# Patient Record
Sex: Male | Born: 1935
Health system: Southern US, Community
[De-identification: ages and names within clinical notes are randomized; demographics above are authoritative.]

## PROBLEM LIST (undated history)

## (undated) DIAGNOSIS — K602 Anal fissure, unspecified: Secondary | ICD-10-CM

## (undated) DIAGNOSIS — L2089 Other atopic dermatitis: Secondary | ICD-10-CM

## (undated) DIAGNOSIS — K5730 Diverticulosis of large intestine without perforation or abscess without bleeding: Secondary | ICD-10-CM

## (undated) DIAGNOSIS — E538 Deficiency of other specified B group vitamins: Secondary | ICD-10-CM

## (undated) DIAGNOSIS — M19039 Primary osteoarthritis, unspecified wrist: Secondary | ICD-10-CM

## (undated) DIAGNOSIS — K589 Irritable bowel syndrome without diarrhea: Secondary | ICD-10-CM

## (undated) DIAGNOSIS — I2510 Atherosclerotic heart disease of native coronary artery without angina pectoris: Secondary | ICD-10-CM

## (undated) DIAGNOSIS — R071 Chest pain on breathing: Secondary | ICD-10-CM

## (undated) DIAGNOSIS — R700 Elevated erythrocyte sedimentation rate: Secondary | ICD-10-CM

## (undated) DIAGNOSIS — E785 Hyperlipidemia, unspecified: Secondary | ICD-10-CM

## (undated) DIAGNOSIS — IMO0002 Reserved for concepts with insufficient information to code with codable children: Secondary | ICD-10-CM

## (undated) DIAGNOSIS — M542 Cervicalgia: Secondary | ICD-10-CM

## (undated) DIAGNOSIS — J320 Chronic maxillary sinusitis: Secondary | ICD-10-CM

## (undated) DIAGNOSIS — Z86010 Personal history of colonic polyps: Secondary | ICD-10-CM

## (undated) DIAGNOSIS — F05 Delirium due to known physiological condition: Secondary | ICD-10-CM

## (undated) DIAGNOSIS — E0590 Thyrotoxicosis, unspecified without thyrotoxic crisis or storm: Secondary | ICD-10-CM

## (undated) DIAGNOSIS — R233 Spontaneous ecchymoses: Secondary | ICD-10-CM

## (undated) DIAGNOSIS — N289 Disorder of kidney and ureter, unspecified: Secondary | ICD-10-CM

## (undated) DIAGNOSIS — K222 Esophageal obstruction: Secondary | ICD-10-CM

## (undated) DIAGNOSIS — K2940 Chronic atrophic gastritis without bleeding: Secondary | ICD-10-CM

## (undated) DIAGNOSIS — Z87442 Personal history of urinary calculi: Secondary | ICD-10-CM

## (undated) DIAGNOSIS — G608 Other hereditary and idiopathic neuropathies: Secondary | ICD-10-CM

## (undated) DIAGNOSIS — Z951 Presence of aortocoronary bypass graft: Secondary | ICD-10-CM

## (undated) DIAGNOSIS — I10 Essential (primary) hypertension: Secondary | ICD-10-CM

## (undated) DIAGNOSIS — L570 Actinic keratosis: Secondary | ICD-10-CM

## (undated) DIAGNOSIS — J0100 Acute maxillary sinusitis, unspecified: Secondary | ICD-10-CM

## (undated) DIAGNOSIS — C801 Malignant (primary) neoplasm, unspecified: Secondary | ICD-10-CM

## (undated) DIAGNOSIS — J309 Allergic rhinitis, unspecified: Secondary | ICD-10-CM

## (undated) DIAGNOSIS — K648 Other hemorrhoids: Secondary | ICD-10-CM

## (undated) DIAGNOSIS — K5289 Other specified noninfective gastroenteritis and colitis: Secondary | ICD-10-CM

## (undated) DIAGNOSIS — L719 Rosacea, unspecified: Secondary | ICD-10-CM

## (undated) HISTORY — DX: Other hemorrhoids: K64.8

## (undated) HISTORY — DX: Elevated erythrocyte sedimentation rate: R70.0

## (undated) HISTORY — PX: COLONOSCOPY: SHX174

## (undated) HISTORY — PX: VARICOSE VEIN SURGERY: SHX832

## (undated) HISTORY — DX: Reserved for concepts with insufficient information to code with codable children: IMO0002

## (undated) HISTORY — DX: Other atopic dermatitis: L20.89

## (undated) HISTORY — PX: TONSILLECTOMY: SUR1361

## (undated) HISTORY — DX: Atherosclerotic heart disease of native coronary artery without angina pectoris: I25.10

## (undated) HISTORY — DX: Spontaneous ecchymoses: R23.3

## (undated) HISTORY — DX: Chronic atrophic gastritis without bleeding: K29.40

## (undated) HISTORY — DX: Irritable bowel syndrome without diarrhea: K58.9

## (undated) HISTORY — DX: Disorder of kidney and ureter, unspecified: N28.9

## (undated) HISTORY — DX: Thyrotoxicosis, unspecified without thyrotoxic crisis or storm: E05.90

## (undated) HISTORY — DX: Allergic rhinitis, unspecified: J30.9

## (undated) HISTORY — PX: EYE SURGERY: SHX253

## (undated) HISTORY — DX: Rosacea, unspecified: L71.9

## (undated) HISTORY — DX: Actinic keratosis: L57.0

## (undated) HISTORY — PX: FINGER SURGERY: SHX640

## (undated) HISTORY — PX: CARDIAC CATHETERIZATION: SHX172

## (undated) HISTORY — DX: Cervicalgia: M54.2

## (undated) HISTORY — DX: Esophageal obstruction: K22.2

## (undated) HISTORY — DX: Other specified noninfective gastroenteritis and colitis: K52.89

## (undated) HISTORY — DX: Essential (primary) hypertension: I10

## (undated) HISTORY — DX: Chronic maxillary sinusitis: J32.0

## (undated) HISTORY — DX: Primary osteoarthritis, unspecified wrist: M19.039

## (undated) HISTORY — DX: Deficiency of other specified B group vitamins: E53.8

## (undated) HISTORY — DX: Hyperlipidemia, unspecified: E78.5

## (undated) HISTORY — PX: FLEXIBLE SIGMOIDOSCOPY: SHX1649

## (undated) HISTORY — PX: HEMORRHOID BANDING: SHX5850

## (undated) HISTORY — PX: LUMBAR FUSION: SHX111

## (undated) HISTORY — PX: ESOPHAGOGASTRODUODENOSCOPY: SHX1529

## (undated) HISTORY — DX: Other hereditary and idiopathic neuropathies: G60.8

## (undated) HISTORY — DX: Chest pain on breathing: R07.1

## (undated) HISTORY — DX: Anal fissure, unspecified: K60.2

## (undated) HISTORY — DX: Acute maxillary sinusitis, unspecified: J01.00

## (undated) HISTORY — DX: Diverticulosis of large intestine without perforation or abscess without bleeding: K57.30

## (undated) HISTORY — PX: HERNIA REPAIR: SHX51

## (undated) HISTORY — PX: OTHER SURGICAL HISTORY: SHX169

## (undated) HISTORY — DX: Personal history of colonic polyps: Z86.010

## (undated) HISTORY — DX: Malignant (primary) neoplasm, unspecified: C80.1

---

## 1999-04-24 ENCOUNTER — Other Ambulatory Visit: Admission: RE | Admit: 1999-04-24 | Discharge: 1999-04-24 | Payer: Self-pay | Admitting: Oral Surgery

## 1999-05-14 ENCOUNTER — Emergency Department (HOSPITAL_COMMUNITY): Admission: EM | Admit: 1999-05-14 | Discharge: 1999-05-14 | Payer: Self-pay | Admitting: Emergency Medicine

## 1999-05-14 ENCOUNTER — Encounter: Payer: Self-pay | Admitting: Emergency Medicine

## 2000-10-14 ENCOUNTER — Encounter (INDEPENDENT_AMBULATORY_CARE_PROVIDER_SITE_OTHER): Payer: Self-pay | Admitting: Specialist

## 2000-10-14 ENCOUNTER — Other Ambulatory Visit: Admission: RE | Admit: 2000-10-14 | Discharge: 2000-10-14 | Payer: Self-pay | Admitting: Gastroenterology

## 2001-10-26 ENCOUNTER — Encounter: Admission: RE | Admit: 2001-10-26 | Discharge: 2001-11-04 | Payer: Self-pay | Admitting: Internal Medicine

## 2001-12-31 ENCOUNTER — Encounter: Payer: Self-pay | Admitting: Urology

## 2001-12-31 ENCOUNTER — Encounter: Admission: RE | Admit: 2001-12-31 | Discharge: 2001-12-31 | Payer: Self-pay | Admitting: Urology

## 2002-08-05 ENCOUNTER — Encounter: Payer: Self-pay | Admitting: Neurological Surgery

## 2002-08-05 ENCOUNTER — Encounter: Admission: RE | Admit: 2002-08-05 | Discharge: 2002-08-05 | Payer: Self-pay | Admitting: Neurological Surgery

## 2002-08-25 ENCOUNTER — Encounter: Admission: RE | Admit: 2002-08-25 | Discharge: 2002-08-25 | Payer: Self-pay | Admitting: Neurological Surgery

## 2002-08-25 ENCOUNTER — Encounter: Payer: Self-pay | Admitting: Neurological Surgery

## 2002-09-20 ENCOUNTER — Encounter: Payer: Self-pay | Admitting: Neurological Surgery

## 2002-09-20 ENCOUNTER — Encounter: Admission: RE | Admit: 2002-09-20 | Discharge: 2002-09-20 | Payer: Self-pay | Admitting: Neurological Surgery

## 2002-12-29 ENCOUNTER — Encounter: Payer: Self-pay | Admitting: Neurological Surgery

## 2002-12-29 ENCOUNTER — Encounter: Admission: RE | Admit: 2002-12-29 | Discharge: 2002-12-29 | Payer: Self-pay | Admitting: Neurological Surgery

## 2003-01-18 ENCOUNTER — Encounter: Admission: RE | Admit: 2003-01-18 | Discharge: 2003-01-18 | Payer: Self-pay | Admitting: Neurological Surgery

## 2003-01-18 ENCOUNTER — Encounter: Payer: Self-pay | Admitting: Neurological Surgery

## 2003-05-17 ENCOUNTER — Encounter: Payer: Self-pay | Admitting: Neurological Surgery

## 2003-12-23 ENCOUNTER — Emergency Department (HOSPITAL_COMMUNITY): Admission: EM | Admit: 2003-12-23 | Discharge: 2003-12-23 | Payer: Self-pay | Admitting: Emergency Medicine

## 2004-09-17 ENCOUNTER — Ambulatory Visit: Payer: Self-pay | Admitting: Internal Medicine

## 2004-09-24 ENCOUNTER — Ambulatory Visit: Payer: Self-pay | Admitting: Internal Medicine

## 2004-10-08 ENCOUNTER — Ambulatory Visit: Payer: Self-pay | Admitting: Internal Medicine

## 2004-10-16 ENCOUNTER — Ambulatory Visit: Payer: Self-pay | Admitting: Internal Medicine

## 2004-10-29 ENCOUNTER — Ambulatory Visit: Payer: Self-pay | Admitting: Internal Medicine

## 2004-11-07 ENCOUNTER — Ambulatory Visit: Payer: Self-pay | Admitting: Gastroenterology

## 2004-11-20 ENCOUNTER — Ambulatory Visit: Payer: Self-pay | Admitting: Internal Medicine

## 2004-12-03 ENCOUNTER — Ambulatory Visit: Payer: Self-pay | Admitting: Gastroenterology

## 2004-12-05 ENCOUNTER — Ambulatory Visit: Payer: Self-pay | Admitting: Gastroenterology

## 2004-12-06 ENCOUNTER — Encounter: Payer: Self-pay | Admitting: Gastroenterology

## 2004-12-09 ENCOUNTER — Ambulatory Visit: Payer: Self-pay | Admitting: Internal Medicine

## 2004-12-25 ENCOUNTER — Ambulatory Visit: Payer: Self-pay | Admitting: Internal Medicine

## 2004-12-30 ENCOUNTER — Ambulatory Visit: Payer: Self-pay | Admitting: Internal Medicine

## 2005-01-22 ENCOUNTER — Ambulatory Visit: Payer: Self-pay | Admitting: Internal Medicine

## 2005-02-11 ENCOUNTER — Ambulatory Visit: Payer: Self-pay | Admitting: Internal Medicine

## 2005-03-05 ENCOUNTER — Ambulatory Visit: Payer: Self-pay | Admitting: Internal Medicine

## 2005-03-25 ENCOUNTER — Ambulatory Visit: Payer: Self-pay | Admitting: Gastroenterology

## 2005-03-26 ENCOUNTER — Ambulatory Visit: Payer: Self-pay | Admitting: Internal Medicine

## 2005-04-16 ENCOUNTER — Ambulatory Visit: Payer: Self-pay | Admitting: Internal Medicine

## 2005-05-05 ENCOUNTER — Ambulatory Visit: Payer: Self-pay | Admitting: Internal Medicine

## 2005-05-26 ENCOUNTER — Ambulatory Visit: Payer: Self-pay | Admitting: Internal Medicine

## 2005-06-23 ENCOUNTER — Ambulatory Visit: Payer: Self-pay | Admitting: Internal Medicine

## 2005-07-04 ENCOUNTER — Ambulatory Visit: Payer: Self-pay | Admitting: Internal Medicine

## 2005-07-14 ENCOUNTER — Ambulatory Visit: Payer: Self-pay | Admitting: Internal Medicine

## 2005-07-25 ENCOUNTER — Ambulatory Visit: Payer: Self-pay | Admitting: Internal Medicine

## 2005-08-05 ENCOUNTER — Ambulatory Visit: Payer: Self-pay | Admitting: Internal Medicine

## 2005-08-25 ENCOUNTER — Ambulatory Visit: Payer: Self-pay | Admitting: Internal Medicine

## 2005-08-29 ENCOUNTER — Ambulatory Visit: Payer: Self-pay | Admitting: Internal Medicine

## 2005-09-09 ENCOUNTER — Ambulatory Visit: Payer: Self-pay | Admitting: Internal Medicine

## 2005-09-17 ENCOUNTER — Ambulatory Visit: Payer: Self-pay | Admitting: Internal Medicine

## 2005-09-26 ENCOUNTER — Ambulatory Visit: Payer: Self-pay | Admitting: Internal Medicine

## 2005-10-07 ENCOUNTER — Ambulatory Visit: Payer: Self-pay | Admitting: Internal Medicine

## 2005-10-14 ENCOUNTER — Ambulatory Visit: Payer: Self-pay | Admitting: Internal Medicine

## 2005-10-28 ENCOUNTER — Ambulatory Visit: Payer: Self-pay | Admitting: Internal Medicine

## 2005-11-18 ENCOUNTER — Ambulatory Visit: Payer: Self-pay | Admitting: Internal Medicine

## 2005-12-09 ENCOUNTER — Ambulatory Visit: Payer: Self-pay | Admitting: Internal Medicine

## 2005-12-30 ENCOUNTER — Ambulatory Visit: Payer: Self-pay | Admitting: Internal Medicine

## 2006-01-01 ENCOUNTER — Ambulatory Visit: Payer: Self-pay | Admitting: Internal Medicine

## 2006-01-08 ENCOUNTER — Ambulatory Visit: Payer: Self-pay | Admitting: Internal Medicine

## 2006-01-19 ENCOUNTER — Ambulatory Visit: Payer: Self-pay | Admitting: Internal Medicine

## 2006-01-28 ENCOUNTER — Ambulatory Visit: Payer: Self-pay | Admitting: Family Medicine

## 2006-02-02 ENCOUNTER — Encounter (INDEPENDENT_AMBULATORY_CARE_PROVIDER_SITE_OTHER): Payer: Self-pay | Admitting: *Deleted

## 2006-02-02 ENCOUNTER — Ambulatory Visit (HOSPITAL_COMMUNITY): Admission: RE | Admit: 2006-02-02 | Discharge: 2006-02-02 | Payer: Self-pay | Admitting: Nephrology

## 2006-02-10 ENCOUNTER — Ambulatory Visit: Payer: Self-pay | Admitting: Internal Medicine

## 2006-02-24 ENCOUNTER — Ambulatory Visit: Payer: Self-pay | Admitting: Gastroenterology

## 2006-03-19 ENCOUNTER — Ambulatory Visit: Payer: Self-pay | Admitting: Internal Medicine

## 2006-03-26 ENCOUNTER — Ambulatory Visit: Payer: Self-pay | Admitting: Internal Medicine

## 2006-03-27 ENCOUNTER — Ambulatory Visit: Payer: Self-pay | Admitting: Internal Medicine

## 2006-04-07 ENCOUNTER — Ambulatory Visit: Payer: Self-pay | Admitting: Internal Medicine

## 2006-04-14 ENCOUNTER — Ambulatory Visit: Payer: Self-pay | Admitting: Internal Medicine

## 2006-04-28 ENCOUNTER — Ambulatory Visit: Payer: Self-pay | Admitting: Internal Medicine

## 2006-05-18 ENCOUNTER — Ambulatory Visit: Payer: Self-pay | Admitting: Internal Medicine

## 2006-06-15 ENCOUNTER — Ambulatory Visit: Payer: Self-pay | Admitting: Internal Medicine

## 2006-06-25 ENCOUNTER — Ambulatory Visit (HOSPITAL_COMMUNITY): Admission: RE | Admit: 2006-06-25 | Discharge: 2006-06-25 | Payer: Self-pay | Admitting: Urology

## 2006-07-02 ENCOUNTER — Ambulatory Visit: Payer: Self-pay | Admitting: Internal Medicine

## 2006-07-28 ENCOUNTER — Ambulatory Visit: Payer: Self-pay | Admitting: Internal Medicine

## 2006-08-10 ENCOUNTER — Ambulatory Visit: Payer: Self-pay | Admitting: Internal Medicine

## 2006-08-18 ENCOUNTER — Ambulatory Visit: Payer: Self-pay | Admitting: Internal Medicine

## 2006-08-28 ENCOUNTER — Ambulatory Visit (HOSPITAL_BASED_OUTPATIENT_CLINIC_OR_DEPARTMENT_OTHER): Admission: RE | Admit: 2006-08-28 | Discharge: 2006-08-28 | Payer: Self-pay | Admitting: Urology

## 2006-09-10 ENCOUNTER — Ambulatory Visit: Payer: Self-pay | Admitting: Internal Medicine

## 2006-09-10 LAB — CONVERTED CEMR LAB
Potassium: 5 meq/L (ref 3.5–5.1)
Rheumatoid Fact: <20 intl units/mL — ABNORMAL LOW (ref 0.0–20.0)

## 2006-09-22 ENCOUNTER — Ambulatory Visit: Payer: Self-pay | Admitting: Gastroenterology

## 2006-09-29 ENCOUNTER — Ambulatory Visit: Payer: Self-pay | Admitting: Internal Medicine

## 2006-10-19 ENCOUNTER — Ambulatory Visit: Payer: Self-pay | Admitting: Internal Medicine

## 2006-11-12 ENCOUNTER — Ambulatory Visit: Payer: Self-pay | Admitting: Internal Medicine

## 2006-11-24 ENCOUNTER — Ambulatory Visit: Payer: Self-pay | Admitting: Internal Medicine

## 2006-11-30 ENCOUNTER — Ambulatory Visit: Payer: Self-pay | Admitting: Internal Medicine

## 2006-12-22 ENCOUNTER — Ambulatory Visit: Payer: Self-pay | Admitting: Internal Medicine

## 2006-12-22 LAB — CONVERTED CEMR LAB
BUN: 14 mg/dL (ref 6–23)
CO2: 29 meq/L (ref 19–32)
Calcium: 8.4 mg/dL (ref 8.4–10.5)
Chloride: 108 meq/L (ref 96–112)
Creatinine, Ser: 0.9 mg/dL (ref 0.4–1.5)
GFR calc Af Amer: 108 mL/min
GFR calc non Af Amer: 89 mL/min
Glucose, Bld: 99 mg/dL (ref 70–99)
Potassium: 4.7 meq/L (ref 3.5–5.1)
Sodium: 144 meq/L (ref 135–145)

## 2007-01-14 ENCOUNTER — Ambulatory Visit: Payer: Self-pay | Admitting: Internal Medicine

## 2007-02-03 ENCOUNTER — Ambulatory Visit: Payer: Self-pay | Admitting: Internal Medicine

## 2007-02-22 ENCOUNTER — Ambulatory Visit: Payer: Self-pay | Admitting: Internal Medicine

## 2007-03-12 ENCOUNTER — Ambulatory Visit: Payer: Self-pay | Admitting: Internal Medicine

## 2007-03-12 LAB — CONVERTED CEMR LAB
BUN: 16 mg/dL (ref 6–23)
CO2: 24 meq/L (ref 19–32)
Calcium: 8.3 mg/dL — ABNORMAL LOW (ref 8.4–10.5)
Chloride: 110 meq/L (ref 96–112)
Creatinine, Ser: 0.78 mg/dL (ref 0.40–1.50)
Folate: >20 ng/mL
Glucose, Bld: 98 mg/dL (ref 70–99)
Potassium: 5 meq/L (ref 3.5–5.3)
Sodium: 143 meq/L (ref 135–145)
TSH: 4.723 microintl units/mL (ref 0.350–5.50)
Vitamin B-12: >2000 pg/mL — ABNORMAL HIGH (ref 211–911)

## 2007-03-31 ENCOUNTER — Ambulatory Visit: Payer: Self-pay | Admitting: Internal Medicine

## 2007-04-09 DIAGNOSIS — I10 Essential (primary) hypertension: Secondary | ICD-10-CM

## 2007-04-09 DIAGNOSIS — N289 Disorder of kidney and ureter, unspecified: Secondary | ICD-10-CM

## 2007-04-09 DIAGNOSIS — J309 Allergic rhinitis, unspecified: Secondary | ICD-10-CM | POA: Insufficient documentation

## 2007-04-09 HISTORY — DX: Essential (primary) hypertension: I10

## 2007-04-09 HISTORY — DX: Allergic rhinitis, unspecified: J30.9

## 2007-04-09 HISTORY — DX: Disorder of kidney and ureter, unspecified: N28.9

## 2007-04-22 ENCOUNTER — Ambulatory Visit: Payer: Self-pay | Admitting: Internal Medicine

## 2007-05-17 ENCOUNTER — Ambulatory Visit: Payer: Self-pay | Admitting: Internal Medicine

## 2007-05-17 LAB — CONVERTED CEMR LAB
ANA Titer 1: 1:160 {titer} — ABNORMAL HIGH
Albumin ELP: 39.9 % — ABNORMAL LOW (ref 55.8–66.1)
Alpha-1-Globulin: 5.6 % — ABNORMAL HIGH (ref 2.9–4.9)
Alpha-2-Globulin: 22.2 % — ABNORMAL HIGH (ref 7.1–11.8)
Angiotensin 1 Converting Enzyme: 72 units/L — ABNORMAL HIGH (ref 9–67)
Anti Nuclear Antibody(ANA): POSITIVE — AB
Beta Globulin: 6 % (ref 4.7–7.2)
Gamma Globulin: 17.7 % (ref 11.1–18.8)
Hgb A1c MFr Bld: 6.1 % — ABNORMAL HIGH (ref 4.6–6.0)
Total Protein, Serum Electrophoresis: 5.4 g/dL — ABNORMAL LOW (ref 6.0–8.3)

## 2007-05-20 ENCOUNTER — Telehealth (INDEPENDENT_AMBULATORY_CARE_PROVIDER_SITE_OTHER): Payer: Self-pay | Admitting: *Deleted

## 2007-05-20 ENCOUNTER — Encounter: Payer: Self-pay | Admitting: Internal Medicine

## 2007-06-07 ENCOUNTER — Ambulatory Visit: Payer: Self-pay | Admitting: Internal Medicine

## 2007-06-07 DIAGNOSIS — E538 Deficiency of other specified B group vitamins: Secondary | ICD-10-CM | POA: Insufficient documentation

## 2007-06-07 HISTORY — DX: Deficiency of other specified B group vitamins: E53.8

## 2007-06-28 ENCOUNTER — Ambulatory Visit: Payer: Self-pay | Admitting: Internal Medicine

## 2007-06-30 ENCOUNTER — Ambulatory Visit: Payer: Self-pay | Admitting: Internal Medicine

## 2007-07-01 ENCOUNTER — Encounter: Payer: Self-pay | Admitting: Internal Medicine

## 2007-07-21 ENCOUNTER — Ambulatory Visit: Payer: Self-pay | Admitting: Internal Medicine

## 2007-07-21 LAB — CONVERTED CEMR LAB
Albumin ELP: 43.1 % — ABNORMAL LOW (ref 55.8–66.1)
Alpha-1-Globulin: 13.6 % — ABNORMAL HIGH (ref 2.9–4.9)
Alpha-2-Globulin: 13.7 % — ABNORMAL HIGH (ref 7.1–11.8)
Beta Globulin: 6.5 % (ref 4.7–7.2)
Gamma Globulin: 17 % (ref 11.1–18.8)
IgA: 372 mg/dL (ref 68–378)
IgG (Immunoglobin G), Serum: 918 mg/dL (ref 694–1618)
IgM, Serum: 38 mg/dL — ABNORMAL LOW (ref 60–263)
Total Protein, Serum Electrophoresis: 4.8 g/dL — ABNORMAL LOW (ref 6.0–8.3)

## 2007-08-10 ENCOUNTER — Encounter: Payer: Self-pay | Admitting: Internal Medicine

## 2007-08-11 ENCOUNTER — Ambulatory Visit: Payer: Self-pay | Admitting: Internal Medicine

## 2007-08-11 DIAGNOSIS — G608 Other hereditary and idiopathic neuropathies: Secondary | ICD-10-CM

## 2007-08-11 DIAGNOSIS — K589 Irritable bowel syndrome without diarrhea: Secondary | ICD-10-CM | POA: Insufficient documentation

## 2007-08-11 HISTORY — DX: Other hereditary and idiopathic neuropathies: G60.8

## 2007-08-11 HISTORY — DX: Irritable bowel syndrome, unspecified: K58.9

## 2007-08-24 ENCOUNTER — Telehealth: Payer: Self-pay | Admitting: Internal Medicine

## 2007-08-31 ENCOUNTER — Ambulatory Visit: Payer: Self-pay | Admitting: Internal Medicine

## 2007-09-22 ENCOUNTER — Ambulatory Visit: Payer: Self-pay | Admitting: Internal Medicine

## 2007-10-12 ENCOUNTER — Ambulatory Visit: Payer: Self-pay | Admitting: Internal Medicine

## 2007-10-12 DIAGNOSIS — L2089 Other atopic dermatitis: Secondary | ICD-10-CM | POA: Insufficient documentation

## 2007-10-12 HISTORY — DX: Other atopic dermatitis: L20.89

## 2007-10-26 ENCOUNTER — Ambulatory Visit: Payer: Self-pay | Admitting: Gastroenterology

## 2007-10-26 LAB — CONVERTED CEMR LAB
ALT: 15 units/L (ref 0–53)
AST: 25 units/L (ref 0–37)
Albumin: 1.6 g/dL — ABNORMAL LOW (ref 3.5–5.2)
Alkaline Phosphatase: 71 units/L (ref 39–117)
BUN: 17 mg/dL (ref 6–23)
Basophils Absolute: 0 10*3/uL (ref 0.0–0.1)
Basophils Relative: 0.6 % (ref 0.0–1.0)
Bilirubin, Direct: 0.1 mg/dL (ref 0.0–0.3)
CO2: 30 meq/L (ref 19–32)
Calcium: 9 mg/dL (ref 8.4–10.5)
Chloride: 101 meq/L (ref 96–112)
Creatinine, Ser: 0.8 mg/dL (ref 0.4–1.5)
Eosinophils Absolute: 0.3 10*3/uL (ref 0.0–0.6)
Eosinophils Relative: 5 % (ref 0.0–5.0)
Ferritin: 76.8 ng/mL (ref 22.0–322.0)
Folate: >20 ng/mL
GFR calc Af Amer: 123 mL/min
GFR calc non Af Amer: 102 mL/min
Glucose, Bld: 101 mg/dL — ABNORMAL HIGH (ref 70–99)
HCT: 39 % (ref 39.0–52.0)
Hemoglobin: 13.6 g/dL (ref 13.0–17.0)
Iron: 70 ug/dL (ref 42–165)
Lymphocytes Relative: 40.6 % (ref 12.0–46.0)
MCHC: 35 g/dL (ref 30.0–36.0)
MCV: 87.6 fL (ref 78.0–100.0)
Monocytes Absolute: 0.5 10*3/uL (ref 0.2–0.7)
Monocytes Relative: 9 % (ref 3.0–11.0)
Neutro Abs: 2.3 10*3/uL (ref 1.4–7.7)
Neutrophils Relative %: 44.8 % (ref 43.0–77.0)
Platelets: 219 10*3/uL (ref 150–400)
Potassium: 4.5 meq/L (ref 3.5–5.1)
RBC: 4.45 M/uL (ref 4.22–5.81)
RDW: 13.1 % (ref 11.5–14.6)
Saturation Ratios: 30.6 % (ref 20.0–50.0)
Sodium: 138 meq/L (ref 135–145)
TSH: 4.32 microintl units/mL (ref 0.35–5.50)
Total Bilirubin: 0.6 mg/dL (ref 0.3–1.2)
Total Protein: 5.9 g/dL — ABNORMAL LOW (ref 6.0–8.3)
Transferrin: 163.2 mg/dL — ABNORMAL LOW (ref >=212.0)
Vitamin B-12: 366 pg/mL (ref 211–911)
WBC: 5.2 10*3/uL (ref 4.5–10.5)

## 2007-10-29 ENCOUNTER — Encounter: Payer: Self-pay | Admitting: Internal Medicine

## 2007-10-29 ENCOUNTER — Ambulatory Visit: Payer: Self-pay | Admitting: Gastroenterology

## 2007-11-01 ENCOUNTER — Ambulatory Visit: Payer: Self-pay | Admitting: Internal Medicine

## 2007-11-03 ENCOUNTER — Encounter: Payer: Self-pay | Admitting: Internal Medicine

## 2007-11-03 ENCOUNTER — Ambulatory Visit: Payer: Self-pay | Admitting: Gastroenterology

## 2007-11-03 LAB — CONVERTED CEMR LAB
Albumin ELP: 39.9 % — ABNORMAL LOW (ref 55.8–66.1)
Alpha-1-Globulin: 5.9 % — ABNORMAL HIGH (ref 2.9–4.9)
Alpha-2-Globulin: 22.4 % — ABNORMAL HIGH (ref 7.1–11.8)
Bacteria, UA: NEGATIVE
Beta Globulin: 6.1 % (ref 4.7–7.2)
Bilirubin Urine: NEGATIVE
Crystals: NEGATIVE
Gamma Globulin: 17.3 % (ref 11.1–18.8)
Ketones, ur: NEGATIVE mg/dL
Leukocytes, UA: NEGATIVE
Mucus, UA: NEGATIVE
Nitrite: NEGATIVE
Specific Gravity, Urine: >=1.03 (ref 1.000–1.03)
Total Protein, Serum Electrophoresis: 5 g/dL — ABNORMAL LOW (ref 6.0–8.3)
Total Protein, Urine: >=300 mg/dL — AB
Urine Glucose: NEGATIVE mg/dL
Urobilinogen, UA: 0.2 (ref 0.0–1.0)
pH: 6 (ref 5.0–8.0)

## 2007-11-04 ENCOUNTER — Encounter: Payer: Self-pay | Admitting: Gastroenterology

## 2007-11-06 ENCOUNTER — Encounter: Payer: Self-pay | Admitting: Gastroenterology

## 2007-11-06 LAB — CONVERTED CEMR LAB: Protein, Ur: 15296 mg/24hr — ABNORMAL HIGH (ref 50–100)

## 2007-11-23 ENCOUNTER — Ambulatory Visit: Payer: Self-pay | Admitting: Internal Medicine

## 2007-12-08 ENCOUNTER — Telehealth: Payer: Self-pay | Admitting: Internal Medicine

## 2007-12-14 ENCOUNTER — Ambulatory Visit: Payer: Self-pay | Admitting: Internal Medicine

## 2008-01-05 ENCOUNTER — Ambulatory Visit: Payer: Self-pay | Admitting: Internal Medicine

## 2008-01-05 LAB — CONVERTED CEMR LAB
Albumin ELP: 45.4 % — ABNORMAL LOW (ref 55.8–66.1)
Alpha-1-Globulin: 5.8 % — ABNORMAL HIGH (ref 2.9–4.9)
Alpha-2-Globulin: 21.6 % — ABNORMAL HIGH (ref 7.1–11.8)
Beta Globulin: 5.7 % (ref 4.7–7.2)
Gamma Globulin: 12.9 % (ref 11.1–18.8)
IgA: 399 mg/dL — ABNORMAL HIGH (ref 68–378)
IgG (Immunoglobin G), Serum: 621 mg/dL — ABNORMAL LOW (ref 694–1618)
IgM, Serum: 42 mg/dL — ABNORMAL LOW (ref 60–263)
Total Protein, Serum Electrophoresis: 4.9 g/dL — ABNORMAL LOW (ref 6.0–8.3)

## 2008-01-12 ENCOUNTER — Ambulatory Visit: Payer: Self-pay | Admitting: Internal Medicine

## 2008-01-13 ENCOUNTER — Telehealth: Payer: Self-pay | Admitting: Family Medicine

## 2008-01-25 ENCOUNTER — Ambulatory Visit: Payer: Self-pay | Admitting: Internal Medicine

## 2008-02-10 ENCOUNTER — Encounter: Payer: Self-pay | Admitting: Internal Medicine

## 2008-02-14 ENCOUNTER — Telehealth (INDEPENDENT_AMBULATORY_CARE_PROVIDER_SITE_OTHER): Payer: Self-pay | Admitting: *Deleted

## 2008-02-15 ENCOUNTER — Ambulatory Visit: Payer: Self-pay | Admitting: Internal Medicine

## 2008-02-15 LAB — CONVERTED CEMR LAB
Cholesterol: 419 mg/dL (ref 0–200)
Direct LDL: 267.6 mg/dL
HDL: 74 mg/dL (ref >39.0)
Total CHOL/HDL Ratio: 5.7
Triglycerides: 278 mg/dL (ref 0–149)
VLDL: 56 mg/dL — ABNORMAL HIGH (ref 0–40)

## 2008-03-07 ENCOUNTER — Ambulatory Visit: Payer: Self-pay | Admitting: Internal Medicine

## 2008-03-13 ENCOUNTER — Ambulatory Visit: Payer: Self-pay | Admitting: Internal Medicine

## 2008-03-13 DIAGNOSIS — E785 Hyperlipidemia, unspecified: Secondary | ICD-10-CM

## 2008-03-13 HISTORY — DX: Hyperlipidemia, unspecified: E78.5

## 2008-03-13 LAB — CONVERTED CEMR LAB
BUN: 22 mg/dL (ref 6–23)
CO2: 35 meq/L — ABNORMAL HIGH (ref 19–32)
Calcium: 8.5 mg/dL (ref 8.4–10.5)
Chloride: 107 meq/L (ref 96–112)
Creatinine, Ser: 0.8 mg/dL (ref 0.4–1.5)
Folate: 19.3 ng/mL
GFR calc Af Amer: 123 mL/min
GFR calc non Af Amer: 102 mL/min
Glucose, Bld: 97 mg/dL (ref 70–99)
Magnesium: 2.1 mg/dL (ref 1.5–2.5)
Potassium: 4.5 meq/L (ref 3.5–5.1)
Sodium: 144 meq/L (ref 135–145)
Vitamin B-12: 640 pg/mL (ref 211–911)

## 2008-03-16 ENCOUNTER — Telehealth: Payer: Self-pay | Admitting: Internal Medicine

## 2008-03-21 ENCOUNTER — Telehealth: Payer: Self-pay | Admitting: Internal Medicine

## 2008-03-27 ENCOUNTER — Telehealth: Payer: Self-pay | Admitting: Internal Medicine

## 2008-03-28 ENCOUNTER — Ambulatory Visit: Payer: Self-pay | Admitting: Internal Medicine

## 2008-03-28 LAB — CONVERTED CEMR LAB
Basophils Absolute: 0 10*3/uL (ref 0.0–0.1)
Basophils Relative: 0.3 % (ref 0.0–1.0)
Eosinophils Absolute: 0.1 10*3/uL (ref 0.0–0.7)
Eosinophils Relative: 1.5 % (ref 0.0–5.0)
HCT: 41.7 % (ref 39.0–52.0)
Hemoglobin: 14.1 g/dL (ref 13.0–17.0)
Lymphocytes Relative: 26.1 % (ref 12.0–46.0)
MCHC: 33.8 g/dL (ref 30.0–36.0)
MCV: 90.5 fL (ref 78.0–100.0)
Monocytes Absolute: 0.5 10*3/uL (ref 0.1–1.0)
Monocytes Relative: 5.7 % (ref 3.0–12.0)
Neutro Abs: 5.3 10*3/uL (ref 1.4–7.7)
Neutrophils Relative %: 66.4 % (ref 43.0–77.0)
Platelets: 269 10*3/uL (ref 150–400)
RBC: 4.61 M/uL (ref 4.22–5.81)
RDW: 13.1 % (ref 11.5–14.6)
WBC: 8 10*3/uL (ref 4.5–10.5)

## 2008-04-04 ENCOUNTER — Encounter: Payer: Self-pay | Admitting: Internal Medicine

## 2008-04-18 ENCOUNTER — Ambulatory Visit: Payer: Self-pay | Admitting: Internal Medicine

## 2008-04-24 ENCOUNTER — Ambulatory Visit: Payer: Self-pay | Admitting: Internal Medicine

## 2008-04-24 DIAGNOSIS — IMO0002 Reserved for concepts with insufficient information to code with codable children: Secondary | ICD-10-CM | POA: Insufficient documentation

## 2008-04-24 HISTORY — DX: Reserved for concepts with insufficient information to code with codable children: IMO0002

## 2008-04-28 ENCOUNTER — Telehealth: Payer: Self-pay | Admitting: Internal Medicine

## 2008-04-29 ENCOUNTER — Telehealth: Payer: Self-pay | Admitting: Internal Medicine

## 2008-05-05 ENCOUNTER — Telehealth: Payer: Self-pay | Admitting: Internal Medicine

## 2008-05-05 ENCOUNTER — Ambulatory Visit: Payer: Self-pay | Admitting: Family Medicine

## 2008-05-23 ENCOUNTER — Ambulatory Visit: Payer: Self-pay | Admitting: Internal Medicine

## 2008-05-29 ENCOUNTER — Ambulatory Visit: Payer: Self-pay | Admitting: Internal Medicine

## 2008-05-29 DIAGNOSIS — J320 Chronic maxillary sinusitis: Secondary | ICD-10-CM

## 2008-05-29 HISTORY — DX: Chronic maxillary sinusitis: J32.0

## 2008-06-05 ENCOUNTER — Telehealth: Payer: Self-pay | Admitting: Internal Medicine

## 2008-06-06 ENCOUNTER — Encounter: Payer: Self-pay | Admitting: Internal Medicine

## 2008-06-13 ENCOUNTER — Ambulatory Visit: Payer: Self-pay | Admitting: Internal Medicine

## 2008-06-27 ENCOUNTER — Ambulatory Visit: Payer: Self-pay | Admitting: Internal Medicine

## 2008-06-27 DIAGNOSIS — R233 Spontaneous ecchymoses: Secondary | ICD-10-CM

## 2008-06-27 HISTORY — DX: Spontaneous ecchymoses: R23.3

## 2008-07-04 ENCOUNTER — Ambulatory Visit: Payer: Self-pay | Admitting: Internal Medicine

## 2008-07-25 ENCOUNTER — Ambulatory Visit: Payer: Self-pay | Admitting: Internal Medicine

## 2008-08-15 ENCOUNTER — Ambulatory Visit: Payer: Self-pay | Admitting: Internal Medicine

## 2008-08-17 ENCOUNTER — Encounter: Payer: Self-pay | Admitting: Internal Medicine

## 2008-08-24 ENCOUNTER — Telehealth: Payer: Self-pay | Admitting: Internal Medicine

## 2008-08-29 ENCOUNTER — Ambulatory Visit: Payer: Self-pay | Admitting: Internal Medicine

## 2008-08-31 ENCOUNTER — Telehealth: Payer: Self-pay | Admitting: Internal Medicine

## 2008-08-31 ENCOUNTER — Encounter: Payer: Self-pay | Admitting: Internal Medicine

## 2008-09-05 ENCOUNTER — Ambulatory Visit: Payer: Self-pay | Admitting: Internal Medicine

## 2008-09-07 ENCOUNTER — Telehealth: Payer: Self-pay | Admitting: Family Medicine

## 2008-09-11 ENCOUNTER — Telehealth: Payer: Self-pay | Admitting: Internal Medicine

## 2008-09-14 ENCOUNTER — Ambulatory Visit: Payer: Self-pay | Admitting: Internal Medicine

## 2008-09-14 ENCOUNTER — Telehealth: Payer: Self-pay | Admitting: Internal Medicine

## 2008-09-14 DIAGNOSIS — M542 Cervicalgia: Secondary | ICD-10-CM

## 2008-09-14 HISTORY — DX: Cervicalgia: M54.2

## 2008-09-16 ENCOUNTER — Telehealth: Payer: Self-pay | Admitting: Family Medicine

## 2008-09-18 ENCOUNTER — Telehealth: Payer: Self-pay | Admitting: Internal Medicine

## 2008-09-18 ENCOUNTER — Emergency Department (HOSPITAL_COMMUNITY): Admission: EM | Admit: 2008-09-18 | Discharge: 2008-09-18 | Payer: Self-pay | Admitting: Emergency Medicine

## 2008-09-20 ENCOUNTER — Ambulatory Visit: Payer: Self-pay | Admitting: Internal Medicine

## 2008-09-21 ENCOUNTER — Telehealth: Payer: Self-pay | Admitting: Internal Medicine

## 2008-09-22 ENCOUNTER — Telehealth: Payer: Self-pay | Admitting: Internal Medicine

## 2008-09-25 ENCOUNTER — Ambulatory Visit: Payer: Self-pay | Admitting: Internal Medicine

## 2008-09-25 ENCOUNTER — Telehealth: Payer: Self-pay | Admitting: Internal Medicine

## 2008-10-03 ENCOUNTER — Telehealth: Payer: Self-pay | Admitting: Internal Medicine

## 2008-10-10 ENCOUNTER — Telehealth: Payer: Self-pay | Admitting: Internal Medicine

## 2008-10-16 ENCOUNTER — Ambulatory Visit: Payer: Self-pay | Admitting: Internal Medicine

## 2008-10-16 LAB — CONVERTED CEMR LAB
BUN: 19 mg/dL (ref 6–23)
CO2: 30 meq/L (ref 19–32)
Calcium: 8.4 mg/dL (ref 8.4–10.5)
Chloride: 107 meq/L (ref 96–112)
Creatinine, Ser: 0.8 mg/dL (ref 0.4–1.5)
GFR calc Af Amer: 123 mL/min
GFR calc non Af Amer: 102 mL/min
Glucose, Bld: 114 mg/dL — ABNORMAL HIGH (ref 70–99)
Potassium: 4.5 meq/L (ref 3.5–5.1)
Sodium: 144 meq/L (ref 135–145)

## 2008-11-01 ENCOUNTER — Telehealth: Payer: Self-pay | Admitting: Internal Medicine

## 2008-11-06 ENCOUNTER — Ambulatory Visit: Payer: Self-pay | Admitting: Internal Medicine

## 2008-11-27 ENCOUNTER — Ambulatory Visit: Payer: Self-pay | Admitting: Internal Medicine

## 2008-12-11 ENCOUNTER — Ambulatory Visit: Payer: Self-pay | Admitting: Internal Medicine

## 2008-12-11 DIAGNOSIS — H43399 Other vitreous opacities, unspecified eye: Secondary | ICD-10-CM | POA: Insufficient documentation

## 2008-12-18 ENCOUNTER — Ambulatory Visit: Payer: Self-pay | Admitting: Internal Medicine

## 2009-01-08 ENCOUNTER — Ambulatory Visit: Payer: Self-pay | Admitting: Internal Medicine

## 2009-01-29 ENCOUNTER — Ambulatory Visit: Payer: Self-pay | Admitting: Internal Medicine

## 2009-02-19 ENCOUNTER — Ambulatory Visit: Payer: Self-pay | Admitting: Internal Medicine

## 2009-03-12 ENCOUNTER — Ambulatory Visit: Payer: Self-pay | Admitting: Internal Medicine

## 2009-03-27 ENCOUNTER — Ambulatory Visit: Payer: Self-pay | Admitting: Internal Medicine

## 2009-03-27 LAB — CONVERTED CEMR LAB
BUN: 21 mg/dL (ref 6–23)
CO2: 31 meq/L (ref 19–32)
Calcium: 9 mg/dL (ref 8.4–10.5)
Chloride: 109 meq/L (ref 96–112)
Cholesterol: 256 mg/dL — ABNORMAL HIGH (ref 0–200)
Creatinine, Ser: 0.8 mg/dL (ref 0.4–1.5)
Direct LDL: 173.6 mg/dL
Folate: >20 ng/mL
GFR calc non Af Amer: 101.36 mL/min (ref >60)
Glucose, Bld: 97 mg/dL (ref 70–99)
HDL: 33.9 mg/dL — ABNORMAL LOW (ref >39.00)
Potassium: 4.6 meq/L (ref 3.5–5.1)
Sodium: 143 meq/L (ref 135–145)
Vitamin B-12: 374 pg/mL (ref 211–911)

## 2009-03-30 ENCOUNTER — Telehealth: Payer: Self-pay | Admitting: Internal Medicine

## 2009-04-02 ENCOUNTER — Ambulatory Visit: Payer: Self-pay | Admitting: Internal Medicine

## 2009-04-03 ENCOUNTER — Encounter: Admission: RE | Admit: 2009-04-03 | Discharge: 2009-04-03 | Payer: Self-pay | Admitting: Sports Medicine

## 2009-04-05 ENCOUNTER — Telehealth: Payer: Self-pay | Admitting: Internal Medicine

## 2009-04-18 ENCOUNTER — Ambulatory Visit: Payer: Self-pay | Admitting: Internal Medicine

## 2009-04-18 DIAGNOSIS — L570 Actinic keratosis: Secondary | ICD-10-CM | POA: Insufficient documentation

## 2009-04-18 HISTORY — DX: Actinic keratosis: L57.0

## 2009-04-27 DIAGNOSIS — K222 Esophageal obstruction: Secondary | ICD-10-CM

## 2009-04-27 DIAGNOSIS — K5289 Other specified noninfective gastroenteritis and colitis: Secondary | ICD-10-CM

## 2009-04-27 DIAGNOSIS — K5730 Diverticulosis of large intestine without perforation or abscess without bleeding: Secondary | ICD-10-CM

## 2009-04-27 DIAGNOSIS — K2940 Chronic atrophic gastritis without bleeding: Secondary | ICD-10-CM | POA: Insufficient documentation

## 2009-04-27 DIAGNOSIS — Z860100 Personal history of colon polyps, unspecified: Secondary | ICD-10-CM | POA: Insufficient documentation

## 2009-04-27 HISTORY — DX: Other specified noninfective gastroenteritis and colitis: K52.89

## 2009-04-27 HISTORY — DX: Personal history of colon polyps, unspecified: Z86.0100

## 2009-04-27 HISTORY — DX: Esophageal obstruction: K22.2

## 2009-04-27 HISTORY — DX: Personal history of colonic polyps: Z86.010

## 2009-04-27 HISTORY — DX: Chronic atrophic gastritis without bleeding: K29.40

## 2009-04-27 HISTORY — DX: Diverticulosis of large intestine without perforation or abscess without bleeding: K57.30

## 2009-05-01 ENCOUNTER — Ambulatory Visit: Payer: Self-pay | Admitting: Gastroenterology

## 2009-05-01 DIAGNOSIS — R1031 Right lower quadrant pain: Secondary | ICD-10-CM | POA: Insufficient documentation

## 2009-05-02 DIAGNOSIS — R700 Elevated erythrocyte sedimentation rate: Secondary | ICD-10-CM

## 2009-05-02 HISTORY — DX: Elevated erythrocyte sedimentation rate: R70.0

## 2009-05-02 LAB — CONVERTED CEMR LAB: Sed Rate: 74 mm/hr — ABNORMAL HIGH (ref 0–22)

## 2009-05-03 ENCOUNTER — Ambulatory Visit: Payer: Self-pay | Admitting: Gastroenterology

## 2009-05-03 LAB — CONVERTED CEMR LAB
BUN: 21 mg/dL (ref 6–23)
Creatinine, Ser: 0.9 mg/dL (ref 0.4–1.5)

## 2009-05-04 ENCOUNTER — Encounter: Payer: Self-pay | Admitting: Internal Medicine

## 2009-05-04 ENCOUNTER — Telehealth: Payer: Self-pay | Admitting: Gastroenterology

## 2009-05-07 ENCOUNTER — Telehealth: Payer: Self-pay | Admitting: Gastroenterology

## 2009-05-07 ENCOUNTER — Ambulatory Visit: Payer: Self-pay | Admitting: Internal Medicine

## 2009-05-09 ENCOUNTER — Ambulatory Visit: Payer: Self-pay | Admitting: Internal Medicine

## 2009-05-15 ENCOUNTER — Ambulatory Visit: Payer: Self-pay | Admitting: Internal Medicine

## 2009-05-15 DIAGNOSIS — R109 Unspecified abdominal pain: Secondary | ICD-10-CM | POA: Insufficient documentation

## 2009-05-30 ENCOUNTER — Ambulatory Visit: Payer: Self-pay | Admitting: Internal Medicine

## 2009-06-15 ENCOUNTER — Ambulatory Visit: Payer: Self-pay | Admitting: Family Medicine

## 2009-06-15 DIAGNOSIS — R071 Chest pain on breathing: Secondary | ICD-10-CM | POA: Insufficient documentation

## 2009-06-15 HISTORY — DX: Chest pain on breathing: R07.1

## 2009-06-27 ENCOUNTER — Ambulatory Visit: Payer: Self-pay | Admitting: Internal Medicine

## 2009-06-27 DIAGNOSIS — L719 Rosacea, unspecified: Secondary | ICD-10-CM | POA: Insufficient documentation

## 2009-06-27 HISTORY — DX: Rosacea, unspecified: L71.9

## 2009-07-05 ENCOUNTER — Telehealth: Payer: Self-pay | Admitting: Internal Medicine

## 2009-07-08 ENCOUNTER — Telehealth: Payer: Self-pay | Admitting: Family Medicine

## 2009-07-09 ENCOUNTER — Telehealth (INDEPENDENT_AMBULATORY_CARE_PROVIDER_SITE_OTHER): Payer: Self-pay | Admitting: *Deleted

## 2009-07-10 ENCOUNTER — Telehealth: Payer: Self-pay | Admitting: Internal Medicine

## 2009-07-18 ENCOUNTER — Ambulatory Visit: Payer: Self-pay | Admitting: Internal Medicine

## 2009-08-06 ENCOUNTER — Telehealth: Payer: Self-pay | Admitting: Internal Medicine

## 2009-08-08 ENCOUNTER — Ambulatory Visit: Payer: Self-pay | Admitting: Internal Medicine

## 2009-08-16 ENCOUNTER — Telehealth: Payer: Self-pay | Admitting: Internal Medicine

## 2009-08-20 ENCOUNTER — Ambulatory Visit: Payer: Self-pay | Admitting: Internal Medicine

## 2009-08-27 LAB — HM DIABETES EYE EXAM

## 2009-08-29 ENCOUNTER — Ambulatory Visit: Payer: Self-pay | Admitting: Internal Medicine

## 2009-09-24 ENCOUNTER — Ambulatory Visit: Payer: Self-pay | Admitting: Internal Medicine

## 2009-10-15 ENCOUNTER — Encounter: Admission: RE | Admit: 2009-10-15 | Discharge: 2009-10-15 | Payer: Self-pay | Admitting: Sports Medicine

## 2009-10-17 ENCOUNTER — Ambulatory Visit: Payer: Self-pay | Admitting: Internal Medicine

## 2009-10-19 ENCOUNTER — Telehealth: Payer: Self-pay | Admitting: Internal Medicine

## 2009-10-24 ENCOUNTER — Encounter (INDEPENDENT_AMBULATORY_CARE_PROVIDER_SITE_OTHER): Payer: Self-pay | Admitting: *Deleted

## 2009-10-30 ENCOUNTER — Ambulatory Visit: Payer: Self-pay | Admitting: Gastroenterology

## 2009-10-30 LAB — CONVERTED CEMR LAB
ALT: 14 units/L (ref 0–53)
AST: 23 units/L (ref 0–37)
Albumin: 2.7 g/dL — ABNORMAL LOW (ref 3.5–5.2)
Alkaline Phosphatase: 56 units/L (ref 39–117)
BUN: 16 mg/dL (ref 6–23)
Basophils Absolute: 0 10*3/uL (ref 0.0–0.1)
Basophils Relative: 0.4 % (ref 0.0–3.0)
Bilirubin, Direct: 0.1 mg/dL (ref 0.0–0.3)
CO2: 30 meq/L (ref 19–32)
CRP, High Sensitivity: 2.6 (ref 0.00–5.00)
Calcium: 8.9 mg/dL (ref 8.4–10.5)
Chloride: 105 meq/L (ref 96–112)
Creatinine, Ser: 0.7 mg/dL (ref 0.4–1.5)
Eosinophils Absolute: 0.2 10*3/uL (ref 0.0–0.7)
Eosinophils Relative: 4.5 % (ref 0.0–5.0)
Ferritin: 60 ng/mL (ref 22.0–322.0)
Folate: >20 ng/mL
GFR calc non Af Amer: 118.04 mL/min (ref >60)
Glucose, Bld: 93 mg/dL (ref 70–99)
HCT: 42.6 % (ref 39.0–52.0)
Hemoglobin: 14.2 g/dL (ref 13.0–17.0)
IgA: 369 mg/dL (ref 68–378)
Iron: 95 ug/dL (ref 42–165)
Lymphocytes Relative: 33.6 % (ref 12.0–46.0)
Lymphs Abs: 1.8 10*3/uL (ref 0.7–4.0)
MCHC: 33.3 g/dL (ref 30.0–36.0)
MCV: 90.5 fL (ref 78.0–100.0)
Monocytes Absolute: 0.4 10*3/uL (ref 0.1–1.0)
Monocytes Relative: 8.2 % (ref 3.0–12.0)
Neutro Abs: 3 10*3/uL (ref 1.4–7.7)
Neutrophils Relative %: 53.3 % (ref 43.0–77.0)
Platelets: 153 10*3/uL (ref 150.0–400.0)
Potassium: 4.5 meq/L (ref 3.5–5.1)
RBC: 4.71 M/uL (ref 4.22–5.81)
RDW: 12.9 % (ref 11.5–14.6)
Saturation Ratios: 35.2 % (ref 20.0–50.0)
Sed Rate: 68 mm/hr — ABNORMAL HIGH (ref 0–22)
Sodium: 142 meq/L (ref 135–145)
TSH: 3.97 microintl units/mL (ref 0.35–5.50)
Total Bilirubin: 0.9 mg/dL (ref 0.3–1.2)
Total Protein: 6.7 g/dL (ref 6.0–8.3)
Transferrin: 192.6 mg/dL — ABNORMAL LOW (ref 212.0–360.0)
Vitamin B-12: 587 pg/mL (ref 211–911)
WBC: 5.4 10*3/uL (ref 4.5–10.5)

## 2009-10-31 ENCOUNTER — Encounter: Admission: RE | Admit: 2009-10-31 | Discharge: 2009-10-31 | Payer: Self-pay | Admitting: Sports Medicine

## 2009-11-05 LAB — CONVERTED CEMR LAB: Tissue Transglutaminase Ab, IgA: 0.8 units (ref <7)

## 2009-11-06 ENCOUNTER — Telehealth: Payer: Self-pay | Admitting: Gastroenterology

## 2009-11-06 ENCOUNTER — Ambulatory Visit: Payer: Self-pay | Admitting: Internal Medicine

## 2009-11-13 ENCOUNTER — Telehealth: Payer: Self-pay | Admitting: Gastroenterology

## 2009-11-21 ENCOUNTER — Ambulatory Visit: Payer: Self-pay | Admitting: Gastroenterology

## 2009-11-21 LAB — HM COLONOSCOPY

## 2009-11-23 ENCOUNTER — Encounter: Payer: Self-pay | Admitting: Gastroenterology

## 2009-11-30 ENCOUNTER — Ambulatory Visit: Payer: Self-pay | Admitting: Internal Medicine

## 2009-12-10 ENCOUNTER — Telehealth: Payer: Self-pay | Admitting: Gastroenterology

## 2009-12-14 ENCOUNTER — Telehealth: Payer: Self-pay | Admitting: Internal Medicine

## 2009-12-18 ENCOUNTER — Encounter: Admission: RE | Admit: 2009-12-18 | Discharge: 2009-12-18 | Payer: Self-pay | Admitting: Surgery

## 2009-12-31 ENCOUNTER — Ambulatory Visit: Payer: Self-pay | Admitting: Internal Medicine

## 2010-01-03 ENCOUNTER — Encounter: Payer: Self-pay | Admitting: Internal Medicine

## 2010-01-09 ENCOUNTER — Encounter: Admission: RE | Admit: 2010-01-09 | Discharge: 2010-01-09 | Payer: Self-pay | Admitting: Chiropractic Medicine

## 2010-01-14 ENCOUNTER — Telehealth: Payer: Self-pay | Admitting: Internal Medicine

## 2010-01-18 ENCOUNTER — Telehealth: Payer: Self-pay | Admitting: Internal Medicine

## 2010-01-21 ENCOUNTER — Ambulatory Visit: Payer: Self-pay | Admitting: Family Medicine

## 2010-02-12 ENCOUNTER — Ambulatory Visit: Payer: Self-pay | Admitting: Internal Medicine

## 2010-02-12 LAB — CONVERTED CEMR LAB
BUN: 18 mg/dL (ref 6–23)
CO2: 30 meq/L (ref 19–32)
Calcium: 8.9 mg/dL (ref 8.4–10.5)
Chloride: 107 meq/L (ref 96–112)
Cholesterol, target level: 200 mg/dL
Cholesterol: 259 mg/dL — ABNORMAL HIGH (ref 0–200)
Creatinine, Ser: 0.8 mg/dL (ref 0.4–1.5)
Direct LDL: 163.2 mg/dL
GFR calc non Af Amer: 101.1 mL/min (ref >60)
Glucose, Bld: 99 mg/dL (ref 70–99)
HDL goal, serum: 40 mg/dL
HDL: 45.2 mg/dL (ref >39.00)
LDL Goal: 130 mg/dL
Potassium: 5.1 meq/L (ref 3.5–5.1)
Sodium: 149 meq/L — ABNORMAL HIGH (ref 135–145)
TSH: 2.35 microintl units/mL (ref 0.35–5.50)

## 2010-02-19 ENCOUNTER — Telehealth: Payer: Self-pay | Admitting: Internal Medicine

## 2010-03-05 ENCOUNTER — Ambulatory Visit: Payer: Self-pay | Admitting: Internal Medicine

## 2010-03-26 ENCOUNTER — Ambulatory Visit: Payer: Self-pay | Admitting: Internal Medicine

## 2010-04-19 ENCOUNTER — Encounter: Payer: Self-pay | Admitting: Internal Medicine

## 2010-04-22 ENCOUNTER — Ambulatory Visit: Payer: Self-pay | Admitting: Internal Medicine

## 2010-04-26 ENCOUNTER — Telehealth: Payer: Self-pay | Admitting: Internal Medicine

## 2010-04-30 ENCOUNTER — Ambulatory Visit: Payer: Self-pay | Admitting: Internal Medicine

## 2010-05-14 ENCOUNTER — Ambulatory Visit: Payer: Self-pay | Admitting: Internal Medicine

## 2010-05-14 DIAGNOSIS — J0100 Acute maxillary sinusitis, unspecified: Secondary | ICD-10-CM

## 2010-05-14 HISTORY — DX: Acute maxillary sinusitis, unspecified: J01.00

## 2010-06-04 ENCOUNTER — Ambulatory Visit: Payer: Self-pay | Admitting: Internal Medicine

## 2010-06-25 ENCOUNTER — Ambulatory Visit: Payer: Self-pay | Admitting: Internal Medicine

## 2010-07-16 ENCOUNTER — Ambulatory Visit: Payer: Self-pay | Admitting: Internal Medicine

## 2010-07-29 ENCOUNTER — Ambulatory Visit: Payer: Self-pay | Admitting: Internal Medicine

## 2010-07-29 LAB — CONVERTED CEMR LAB
ALT: 14 units/L (ref 0–53)
AST: 22 units/L (ref 0–37)
Albumin: 3.2 g/dL — ABNORMAL LOW (ref 3.5–5.2)
Alkaline Phosphatase: 56 units/L (ref 39–117)
Bilirubin, Direct: 0.1 mg/dL (ref 0.0–0.3)
Cholesterol: 217 mg/dL — ABNORMAL HIGH (ref 0–200)
Direct LDL: 145.6 mg/dL
HDL: 38.1 mg/dL — ABNORMAL LOW (ref >39.00)
Total Bilirubin: 0.6 mg/dL (ref 0.3–1.2)
Total CHOL/HDL Ratio: 6
Total Protein: 6.4 g/dL (ref 6.0–8.3)
Triglycerides: 132 mg/dL (ref 0.0–149.0)
VLDL: 26.4 mg/dL (ref 0.0–40.0)

## 2010-07-29 IMAGING — CT CT ABD-PELV W/ CM
3 of 5 series · 13 of 36 positions shown, 19 images · IV contrast (agent unspecified)
Comparison: May 07, 2009

CLINICAL DATA: Right groin pain; history of right inguinal hernia
repair; colitis and diarrhea

CT ABDOMEN AND PELVIS WITH CONTRAST
TECHNIQUE: Multidetector CT imaging of the abdomen and pelvis was
performed following the standard protocol during bolus
administration of intravenous contrast.
Contrast: 125 ml Omni 300

[Series 3: routine abdomen · axial · 0.70mm/px · z∈[-351,-51]mm · 7 of 80 slices shown, 12 images]
[im 10/80  soft-tissue]
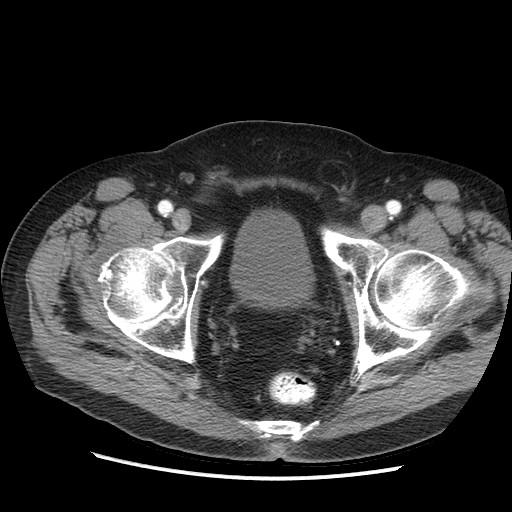
[im 10/80  bone]
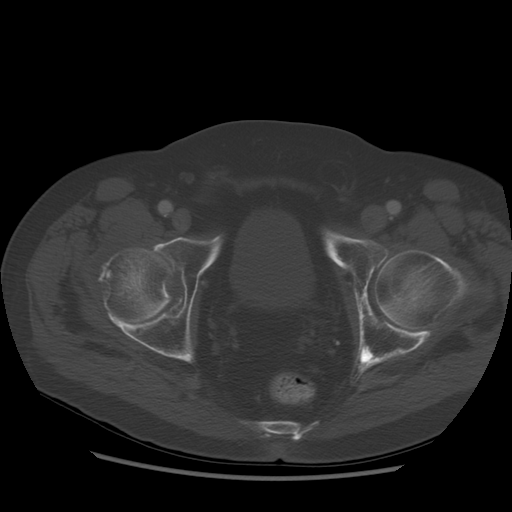
[im 20/80  soft-tissue]
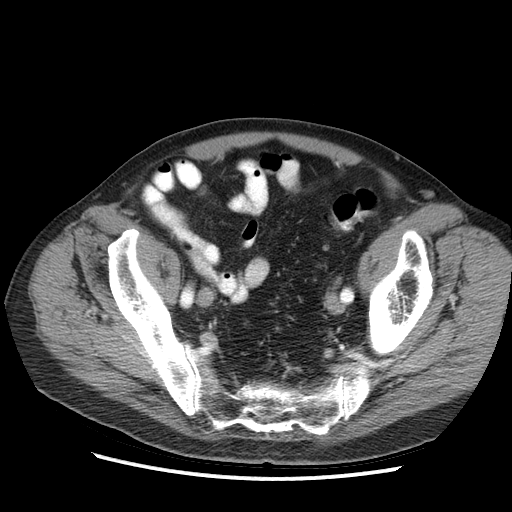
[im 30/80  soft-tissue]
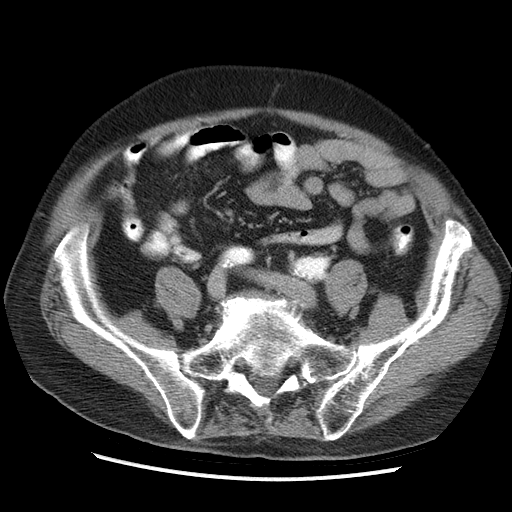
[im 40/80  soft-tissue]
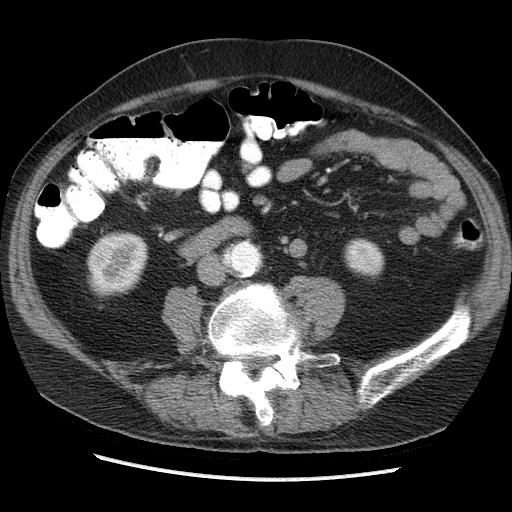
[im 40/80  lung]
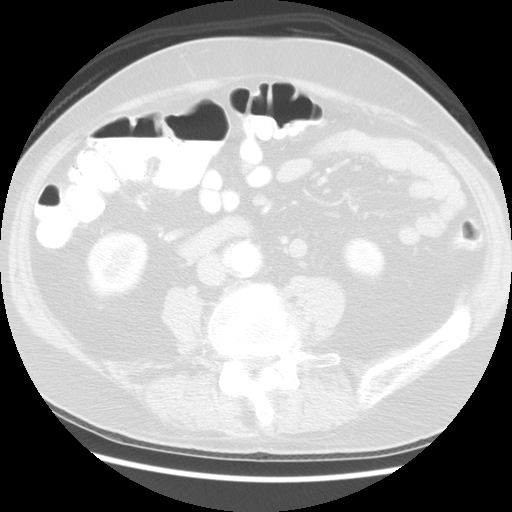
[im 50/80  soft-tissue]
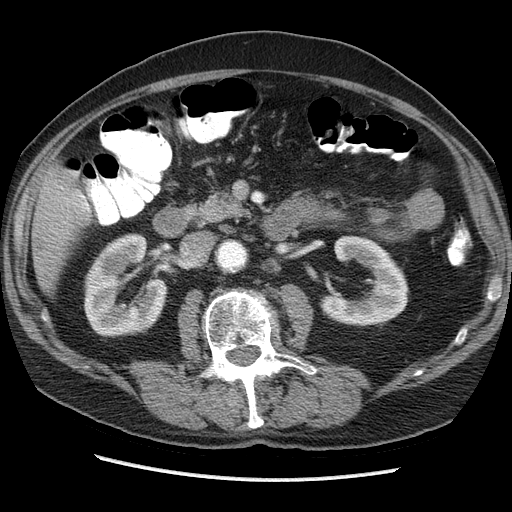
[im 50/80  lung]
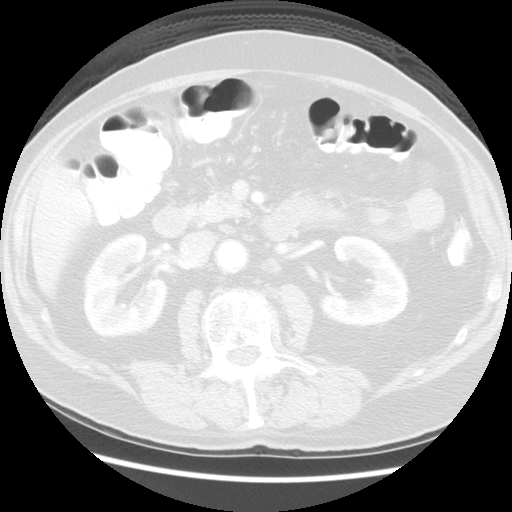
[im 60/80  soft-tissue]
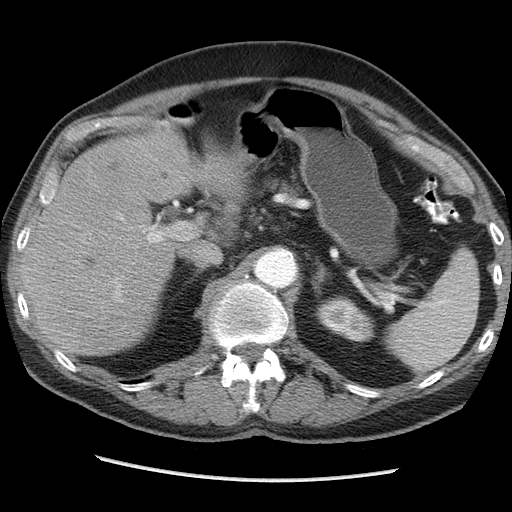
[im 60/80  lung]
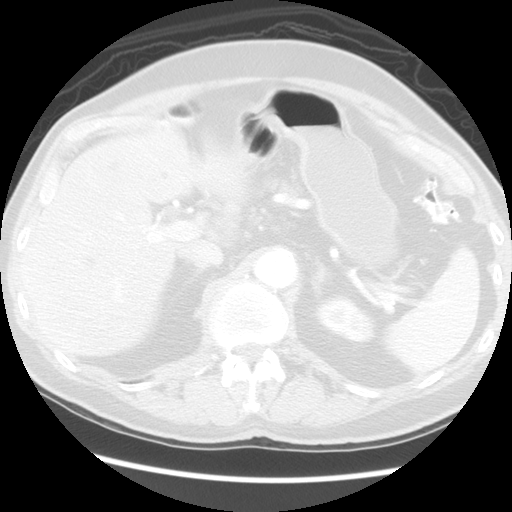
[im 70/80  soft-tissue]
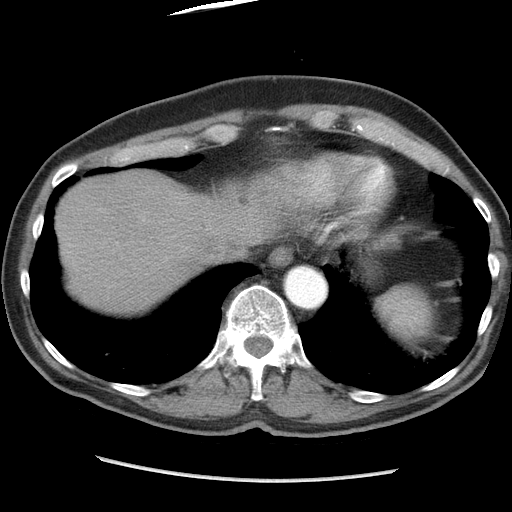
[im 70/80  lung]
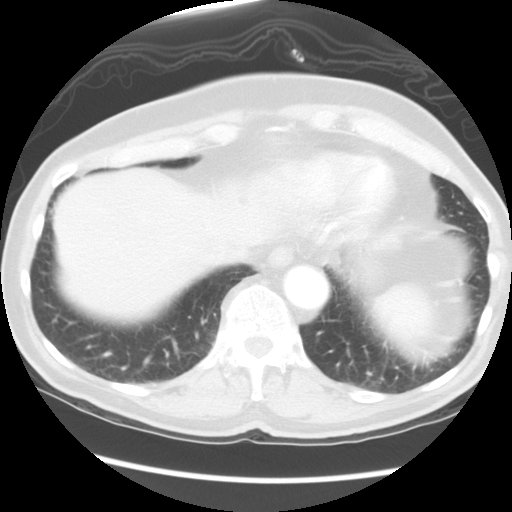

[Series 601: coronal body · coronal · 0.89mm/px · 1 of 133 slices shown, 2 images]
[im 45/133  soft-tissue]
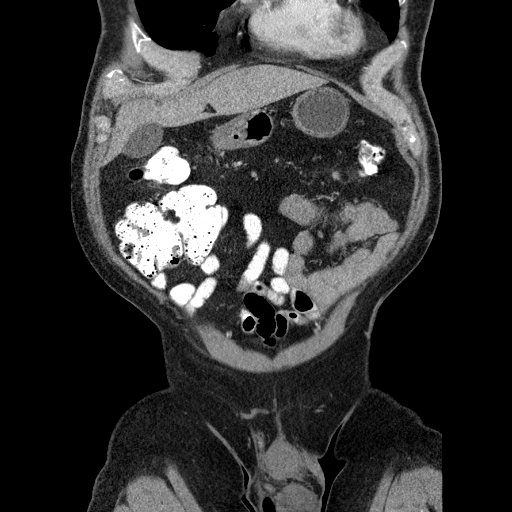
[im 45/133  bone]
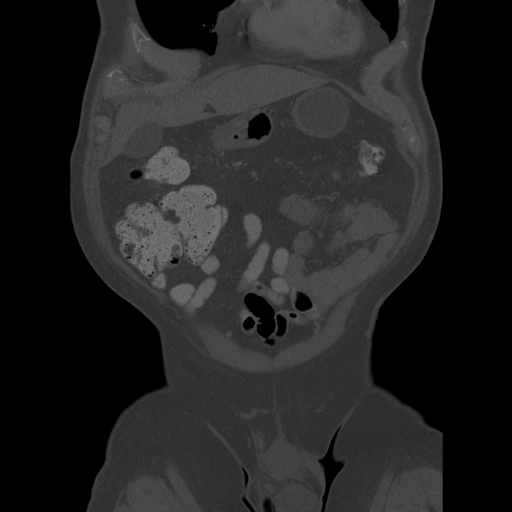

[Series 602: sagittal body · sagittal · 0.89mm/px · 5 of 145 slices shown]
[im 10/145  soft-tissue]
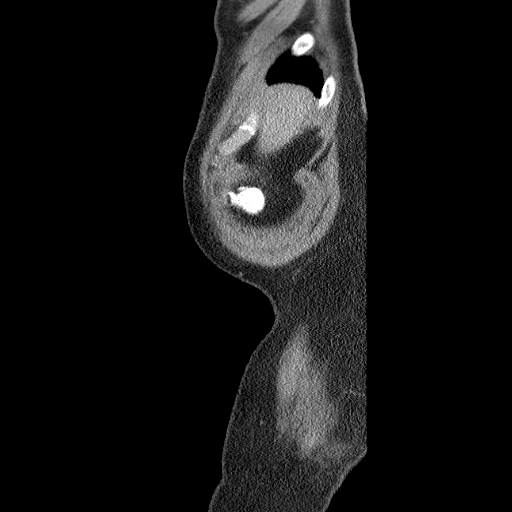
[im 28/145  soft-tissue]
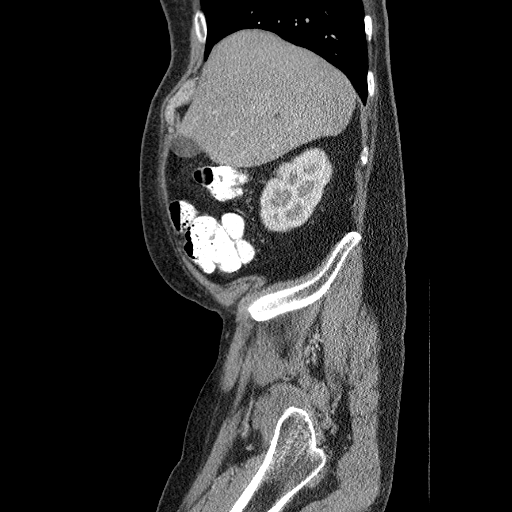
[im 46/145  soft-tissue]
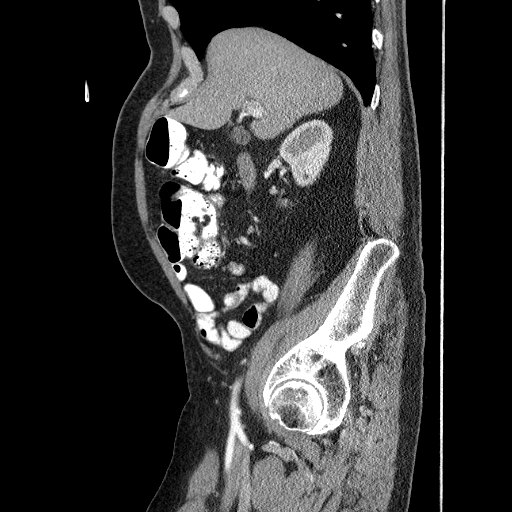
[im 64/145  soft-tissue]
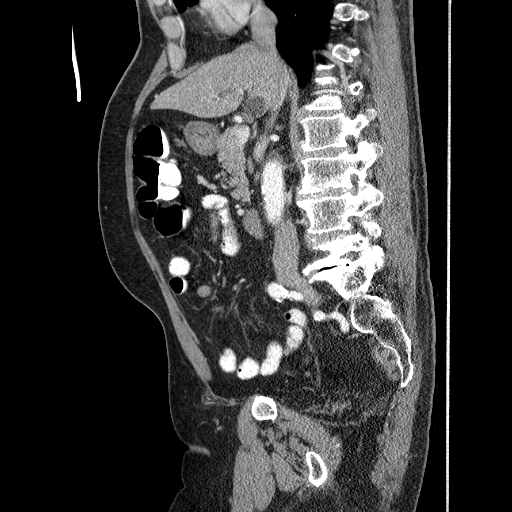
[im 82/145  soft-tissue]
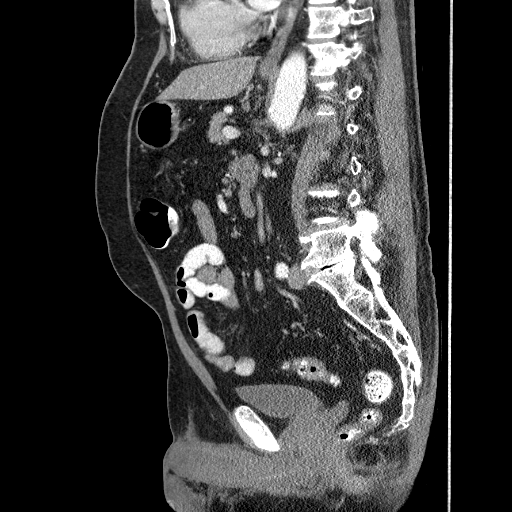

[13 of 36 positions shown; findings below may reference images not displayed]

FINDINGS: The lung bases are clear.  Simple hepatic and renal cysts
are unchanged in size and number .  The spleen, pancreas, adrenal
glands are unremarkable.  The osseous structures have a stable
appearance with degenerative changes in the axial spine and  right
hip and Pagetoid expansion of the left iliac bone.  There is
prominent sigmoid diverticulosis without radiographic evidence of
diverticulitis.  There is no ascites or pneumoperitoneum.  No
adenopathy is present.  There is no inguinal or abdominal wall
hernia.
IMPRESSION: Prominent sigmoid diverticulosis without radiographic evidence of
diverticulitis.

No hernia.

## 2010-08-05 ENCOUNTER — Ambulatory Visit: Payer: Self-pay | Admitting: Internal Medicine

## 2010-08-05 DIAGNOSIS — M19039 Primary osteoarthritis, unspecified wrist: Secondary | ICD-10-CM | POA: Insufficient documentation

## 2010-08-05 HISTORY — DX: Primary osteoarthritis, unspecified wrist: M19.039

## 2010-08-26 ENCOUNTER — Ambulatory Visit: Payer: Self-pay | Admitting: Internal Medicine

## 2010-08-30 ENCOUNTER — Encounter: Payer: Self-pay | Admitting: Internal Medicine

## 2010-09-16 ENCOUNTER — Ambulatory Visit: Payer: Self-pay | Admitting: Internal Medicine

## 2010-10-08 ENCOUNTER — Ambulatory Visit: Payer: Self-pay | Admitting: Internal Medicine

## 2010-10-28 ENCOUNTER — Ambulatory Visit: Payer: Self-pay | Admitting: Internal Medicine

## 2010-10-28 LAB — CONVERTED CEMR LAB
ALT: 14 units/L (ref 0–53)
AST: 20 units/L (ref 0–37)
Albumin: 3.2 g/dL — ABNORMAL LOW (ref 3.5–5.2)
Alkaline Phosphatase: 62 units/L (ref 39–117)
Bilirubin, Direct: 0.1 mg/dL (ref 0.0–0.3)
Cholesterol: 184 mg/dL (ref 0–200)
HDL: 34.2 mg/dL — ABNORMAL LOW (ref >39.00)
LDL Cholesterol: 126 mg/dL — ABNORMAL HIGH (ref 0–99)
Total Bilirubin: 0.6 mg/dL (ref 0.3–1.2)
Total CHOL/HDL Ratio: 5
Total Protein: 6.4 g/dL (ref 6.0–8.3)
Triglycerides: 120 mg/dL (ref 0.0–149.0)
VLDL: 24 mg/dL (ref 0.0–40.0)

## 2010-10-29 ENCOUNTER — Ambulatory Visit: Payer: Self-pay | Admitting: Internal Medicine

## 2010-11-04 ENCOUNTER — Ambulatory Visit: Payer: Self-pay | Admitting: Internal Medicine

## 2010-11-21 ENCOUNTER — Telehealth: Payer: Self-pay | Admitting: Internal Medicine

## 2010-12-10 ENCOUNTER — Ambulatory Visit
Admission: RE | Admit: 2010-12-10 | Discharge: 2010-12-10 | Payer: Self-pay | Source: Home / Self Care | Attending: Internal Medicine | Admitting: Internal Medicine

## 2010-12-17 NOTE — Assessment & Plan Note (Signed)
Summary: flu shot/ccm   Vital Signs:  Patient Profile:   75 Years Old Male Height:     72 inches Weight:      193 pounds Temp:     98. degrees F oral Pulse rate:   72 / minute Resp:     14 per minute BP sitting:   118 / 74  (left arm)  Vitals Entered By: Willy Eddy, LPN (October 16, 2008 9:52 AM)                 Chief Complaint:  roa- c/o weakness from indapamide.  History of Present Illness: the addition of the fluid pill for edema and  nephrotic syndrome He also related that the arm that he recieved the flu shot is tender and still feels swollen although there is no visible lesions or swelling  Hypertension History:      He denies headache, chest pain, palpitations, dyspnea with exertion, orthopnea, PND, peripheral edema, visual symptoms, neurologic problems, syncope, and side effects from treatment.  Further comments include: on indamide.        Positive major cardiovascular risk factors include male age 56 years old or older, hyperlipidemia, and hypertension.  Negative major cardiovascular risk factors include non-tobacco-user status.       Prior Medication List:  CYANOCOBALAMIN 1000 MCG/ML SOLN (CYANOCOBALAMIN) 1.5 ml every 3 weeks DIPHENOXYLATE-ATROPINE 2.5-0.025 MG TABS (DIPHENOXYLATE-ATROPINE) as needed LONGS ONE DAILY MULTI VITAMIN  TABS (MULTIPLE VITAMIN) Take 1 tablet by mouth once a day NADOLOL 20 MG TABS (NADOLOL) 1/2 every day BETAMETHASONE DIPROPIONATE AUG 0.05 %  OINT (AUG BETAMETHASONE DIPROPIONATE) apply to rash OMNARIS 50 MCG/ACT SUSP (CICLESONIDE) two spray in each nostril daily [facility] LORATADINE 10 MG  TABS (LORATADINE) 1 once daily MICARDIS 40 MG TABS (TELMISARTAN) one by mouth daily at bed time XYZAL 5 MG TABS (LEVOCETIRIZINE DIHYDROCHLORIDE) one by mouth daily PARAFON FORTE DSC 500 MG TABS (CHLORZOXAZONE) 1 three times a day INDAPAMIDE 1.25 MG TABS (INDAPAMIDE) 1 once daily PERCOCET 5-325 MG TABS (OXYCODONE-ACETAMINOPHEN) 1-2 every 6 hours  prn TRAMADOL HCL 50 MG TABS (TRAMADOL HCL) Take one (1) tablet by mouth every 6 hours as needed for pain PROPOXYPHENE N-APAP 100-325 MG TABS (PROPOXYPHENE N-APAP) 1-2 every 4-6-hours as needed pain   Current Allergies (reviewed today): ! LIPITOR (ATORVASTATIN CALCIUM) ! * STATINS  ( CRESTOR) PREDNISONE INTENSOL (PREDNISONE) AMOXICILLIN (AMOXICILLIN) CELLCEPT (MYCOPHENOLATE MOFETIL) CIPRO  Past Medical History:    Reviewed history from 03/13/2008 and no changes required:       Hypertension       B12 deff.       Allergic rhinitis       nephritis       neuropathy       Hyperlipidemia  Past Surgical History:    Reviewed history from 08/11/2007 and no changes required:       Inguinal herniorrhaphy       Lumbar laminectomy       Lumbar fusion       Tonsillectomy   Family History:    Reviewed history from 06/28/2007 and no changes required:       n/c  Social History:    Reviewed history from 05/05/2008 and no changes required:       Retired       Married              Former Smoker   Risk Factors: Tobacco use:  quit   Review of Systems  The patient denies anorexia, fever,  weight loss, weight gain, vision loss, decreased hearing, hoarseness, chest pain, syncope, dyspnea on exertion, peripheral edema, prolonged cough, headaches, hemoptysis, abdominal pain, melena, hematochezia, severe indigestion/heartburn, hematuria, incontinence, genital sores, muscle weakness, suspicious skin lesions, transient blindness, difficulty walking, depression, unusual weight change, abnormal bleeding, enlarged lymph nodes, angioedema, and breast masses.     Physical Exam  General:     Well-developed,well-nourished,in no acute distress; alert,appropriate and cooperative throughout examination Eyes:     No corneal or conjunctival inflammation noted. EOMI. Perrla. Funduscopic exam benign, without hemorrhages, exudates or papilledema. Vision grossly normal. Nose:     nasal dischargemucosal  pallor, mucosal erythema, and mucosal edema.   Mouth:     pharyngeal exudate and posterior lymphoid hypertrophy.   Neck:     No deformities, masses, or  tender and stiff Lungs:     Normal respiratory effort, chest expands symmetrically. Lungs are clear to auscultation, no crackles or wheezes. Heart:     normal rate, no murmur, and extrasystoles noted.   Abdomen:     soft, no distention, no masses, and no guarding.      Impression & Recommendations:  Problem # 1:  ALLERGIC RHINITIS (ICD-477.9)  His updated medication list for this problem includes:    Omnaris 50 Mcg/act Susp (Ciclesonide) .[name] Kitchen..[name] Kitchen Two spray in each nostril daily    [facility] Loratadine 10 Mg Tabs (Loratadine) .[name] Kitchen... 1 once daily    Xyzal 5 Mg Tabs (Levocetirizine dihydrochloride) ..... One by mouth daily Discussed use of allergy medications and environmental measures.   Problem # 2:  HYPERLIPIDEMIA (ICD-272.4)  Labs Reviewed: Chol: 419 (02/15/2008)   HDL: 74.0 (02/15/2008)   LDL: 267.6 (02/15/2008)   TG: 278 (02/15/2008) SGOT: 25 (10/26/2007)   SGPT: 15 (10/26/2007)  10 Yr Risk Heart Disease: 22 % Prior 10 Yr Risk Heart Disease: Not enough information (08/11/2007)   Problem # 3:  HYPERTENSION (ICD-401.9) Assessment: Improved the edema and the blood pressure is good His updated medication list for this problem includes:    Nadolol 20 Mg Tabs (Nadolol) .[name] Kitchen... 1/2 every day    Micardis 40 Mg Tabs (Telmisartan) ..... One by mouth daily at bed time    Indapamide 1.25 Mg Tabs (Indapamide) .[name] Kitchen... 1/ 2 once daily  note the change  BP today: 118/74 Prior BP: 156/96 (09/20/2008)  10 Yr Risk Heart Disease: 22 % Prior 10 Yr Risk Heart Disease: Not enough information (08/11/2007)  Labs Reviewed: Creat: 0.8 (03/13/2008) Chol: 419 (02/15/2008)   HDL: 74.0 (02/15/2008)   LDL: 267.6 (02/15/2008)   TG: 278 (02/15/2008)  His updated medication list for this problem includes:    Nadolol 20 Mg Tabs (Nadolol) .[name] Kitchen... 1/2 every day     Micardis 40 Mg Tabs (Telmisartan) ..... One by mouth daily at bed time    Indapamide 1.25 Mg Tabs (Indapamide) .[name] Kitchen... 1/ 2 once daily   Problem # 4:  NECK PAIN (ICD-723.1) Assessment: Deteriorated these are the cause of the of the arm pain and the neck pain is probable the known disc dz in the neck, pt get better with the exercizes for the neck... so we have urged these! His updated medication list for this problem includes:    Parafon Forte Dsc 500 Mg Tabs (Chlorzoxazone) .[name] Kitchen... 1 three times a day    Percocet 5-325 Mg Tabs (Oxycodone-acetaminophen) .[name] Kitchen... 1-2 every 6 hours prn    Tramadol Hcl 50 Mg Tabs (Tramadol hcl) .[name] Kitchen... Take one (1) tablet by mouth every 6 hours as needed for pain  Propoxyphene N-apap 100-325 Mg Tabs (Propoxyphene n-apap) .[name] Kitchen... 1-2 every 4-6-hours as needed pain Discussed exercises and use of moist heat or cold and medication.   Complete Medication List: 1)  Cyanocobalamin 1000 Mcg/ml Soln (Cyanocobalamin) .... 1.5 ml every 3 weeks 2)  Diphenoxylate-atropine 2.5-0.025 Mg Tabs (Diphenoxylate-atropine) .... As needed 3)  Longs One Daily Multi Vitamin Tabs (Multiple vitamin) .... Take 1 tablet by mouth once a day 4)  Nadolol 20 Mg Tabs (Nadolol) .... 1/2 every day 5)  Betamethasone Dipropionate Aug 0.05 % Oint (Aug betamethasone dipropionate) .... Apply to rash 6)  Omnaris 50 Mcg/act Susp (Ciclesonide) .... Two spray in each nostril daily 7)  [facility] Loratadine 10 Mg Tabs (Loratadine) .[name] Kitchen.. 1 once daily 8)  Micardis 40 Mg Tabs (Telmisartan) .... One by mouth daily at bed time 9)  Xyzal 5 Mg Tabs (Levocetirizine dihydrochloride) .... One by mouth daily 10)  Parafon Forte Dsc 500 Mg Tabs (Chlorzoxazone) .[name] Kitchen.. 1 three times a day 11)  Indapamide 1.25 Mg Tabs (Indapamide) .... 1/ 2 once daily 12)  Percocet 5-325 Mg Tabs (Oxycodone-acetaminophen) .[name] Kitchen.. 1-2 every 6 hours prn 13)  Tramadol Hcl 50 Mg Tabs (Tramadol hcl) .... Take one (1) tablet by mouth every 6 hours as needed for  pain 14)  Propoxyphene N-apap 100-325 Mg Tabs (Propoxyphene n-apap) .[name] Kitchen.. 1-2 every 4-6-hours as needed pain  Other Orders: Vit B12 1000 mcg (J3420) Admin of Therapeutic Inj  intramuscular or subcutaneous (16109) TLB-BMP (Basic Metabolic Panel-BMET) (80048-METABOL)  Hypertension Assessment/Plan:      The patient's hypertensive risk group is category B: At least one risk factor (excluding diabetes) with no target organ damage.  His calculated 10 year risk of coronary heart disease is 22 %.  Today's blood pressure is 118/74.  His blood pressure goal is < 125/75.   Patient Instructions: 1)  Please schedule a follow-up appointment in 2 months.   Prescriptions: INDAPAMIDE 1.25 MG TABS (INDAPAMIDE) 1/ 2 once daily  #30 x 11   Entered and Authorized by:   [name] MD   Signed by:   [name] MD on 10/16/2008   Method used:   Electronically to        [facility] [address] [id number]* (retail)       [address] [place], [place]  [address]       Ph: [phone]       Fax: [phone]   RxID:   [id number]  ]  Medication Administration  Injection # 1:    Medication: Vit B12 1000 mcg    Diagnosis: B12 DEFICIENCY (ICD-266.2)    Route: IM    Site: L deltoid    Exp Date: 02/15/2010    Lot #: [id number]    Mfr: [employer]    Comments: 1500 micrograms/1.5 ml     Patient tolerated injection without complications    Given by: Antonietta Jewel, SMA  Orders Added: 1)  Vit B12 1000 mcg [J3420] 2)  Admin of Therapeutic Inj  intramuscular or subcutaneous [96372] 3)  Est. Patient Level IV [27253] 4)  TLB-BMP (Basic Metabolic Panel-BMET) [80048-METABOL]   Appended Document: Orders Update     Clinical Lists Changes  Orders: Added new Service order of Venipuncture [phone]) - Signed

## 2010-12-17 NOTE — Consult Note (Signed)
Summary: [facility]  [facility] Provider: This provider was preselected by the workflow.  Signature: The signature status of this document was preset by the workflow  Processed by [employer] Local Indexer Client @ Monday, October 29, 2009 12:38:38 PM using version:[email or web address](2.4)   Manually Indexed By: [id number]  idlBatchDetail: [id number]   _____________________________________________________________________  External Attachment:    Type:   Image     Comment:   External Document

## 2010-12-17 NOTE — Assessment & Plan Note (Signed)
Summary: not feeling well//ccm   Vital Signs:  Patient profile:   75 year old male Height:      72 inches Weight:      192 pounds BMI:     26.13 Temp:     98.2 degrees F rectal Pulse rate:   76 / minute Resp:     14 per minute BP sitting:   136 / 80  (left arm)  Vitals Entered By: Willy Eddy, LPN (August 20, 2009 11:43 AM)  Nutrition Counseling: Patient's BMI is greater than 25 and therefore counseled on weight management options.  Primary Care Provider:  Darryll Capers, MD  CC:  c/o just not feeling good.  History of Present Illness: Two weeks of "not feeling well" went to an event in the field  and had a flair of allergies and possible exposure to virus he is better now but wants to be chesked out [place] is improved, cough has improved   Hypertension History:      He denies headache, chest pain, palpitations, dyspnea with exertion, orthopnea, PND, peripheral edema, visual symptoms, neurologic problems, syncope, and side effects from treatment.        Positive major cardiovascular risk factors include male age 56 years old or older, hyperlipidemia, and hypertension.  Negative major cardiovascular risk factors include non-tobacco-user status.     Problems Prior to Update: 1)  Acne Rosacea  (ICD-695.3) 2)  Chest Wall Pain, Acute  (ICD-786.52) 3)  Abdominal Pain Other Specified Site  (ICD-789.09) 4)  Elevated Sedimentation Rate  (ICD-790.1) 5)  Rlq Pain  (ICD-789.03) 6)  Colitis  (ICD-558.9) 7)  Esophageal Stricture  (ICD-530.3) 8)  Gastritis, Chronic  (ICD-535.10) 9)  Colonic Polyps, Hx of  (ICD-V12.72) 10)  Diverticulosis, Colon  (ICD-562.10) 11)  Actinic Keratosis, Head  (ICD-702.0) 12)  Eye Floaters  (ICD-379.24) 13)  Neck Pain  (ICD-723.1) 14)  Ecchymoses, Spontaneous  (ICD-782.7) 15)  Chronic Maxillary Sinusitis  (ICD-473.0) 16)  Back Pain With Radiculopathy  (ICD-729.2) 17)  Hyperlipidemia  (ICD-272.4) 18)  Dermatitis, Atopic  (ICD-691.8) 19)   Irritable Bowel Syndrome  (ICD-564.1) 20)  Neuropathy, Idiopathic Peripheral Nec  (ICD-356.8) 21)  B12 Deficiency  (ICD-266.2) 22)  Kidney Disease  (ICD-593.9) 23)  Allergic Rhinitis  (ICD-477.9) 24)  Hypertension  (ICD-401.9)  Medications Prior to Update: 1)  Cyanocobalamin 1000 Mcg/ml Soln (Cyanocobalamin) .... 1.5 Ml Every 3 Weeks 2)  Diphenoxylate-Atropine 2.5-0.025 Mg Tabs (Diphenoxylate-Atropine) .... As Needed 3)  Nadolol 20 Mg Tabs (Nadolol) .... 1/2 Every Day 4)  Micardis 40 Mg Tabs (Telmisartan) .... One By Mouth Daily At Bed Time 5)  Indapamide 1.25 Mg Tabs (Indapamide) .... 1/ 2 Once Daily 6)  Centrum Silver  Tabs (Multiple Vitamins-Minerals) .... One Tablet By Mouth Once Daily 7)  Fish Oil   Oil (Fish Oil) .... One Tablet By Mouth Once Daily 8)  Tylenol Extra Strength 500 Mg Tabs (Acetaminophen) .... As Needed For Pain 9)  Xifaxan 550 Mg .... Take One Tablet By Mouth Two Times A Day 10)  Align   Caps (Misc Intestinal Flora Regulat) .... Take One Capsule By Mouth Daily 11)  Propoxyphene N-Apap 100-325 Mg Tabs (Propoxyphene N-Apap) .... One By Mouth Q 4-6 Hours Prn 12)  Clindamycin Phos-Benzoyl Perox 1-5 % Gel (Clindamycin Phos-Benzoyl Perox) .... Apply To Face Daily After Washing 13)  Astepro 0.15 % Soln (Azelastine Hcl) .... Two Spray in Each Nostril Daily 14)  Atrovent 0.03 % Soln (Ipratropium Bromide) .... Nasal Spray- 1 Spray  in Each Nostril Two Times A Day 15)  Zithromax Z-Pak 250 Mg Tabs (Azithromycin) .... Use As Directed. 16)  Allegra-D 12 Hour 60-120 Mg Xr12h-Tab (Fexofenadine-Pseudoephedrine) .... One Tablet Two Time A Day X 10 Days.  Current Medications (verified): 1)  Cyanocobalamin 1000 Mcg/ml Soln (Cyanocobalamin) .... 1.5 Ml Every 3 Weeks 2)  Diphenoxylate-Atropine 2.5-0.025 Mg Tabs (Diphenoxylate-Atropine) .... As Needed 3)  Nadolol 20 Mg Tabs (Nadolol) .... 1/2 Every Day 4)  Losartan Potassium 50 Mg Tabs (Losartan Potassium) .... One By Mouth Daily 5)   Indapamide 1.25 Mg Tabs (Indapamide) .... 1/ 2 Once Daily 6)  Centrum Silver  Tabs (Multiple Vitamins-Minerals) .... One Tablet By Mouth Once Daily 7)  Fish Oil   Oil (Fish Oil) .... One Tablet By Mouth Once Daily 8)  Tylenol Extra Strength 500 Mg Tabs (Acetaminophen) .... As Needed For Pain 9)  Xifaxan 550 Mg .... Take One Tablet By Mouth Two Times A Day 10)  Align   Caps (Misc Intestinal Flora Regulat) .... Take One Capsule By Mouth Daily 11)  Propoxyphene N-Apap 100-325 Mg Tabs (Propoxyphene N-Apap) .... One By Mouth Q 4-6 Hours Prn 12)  Clindamycin Phos-Benzoyl Perox 1-5 % Gel (Clindamycin Phos-Benzoyl Perox) .... Apply To Face Daily After Washing 13)  Astepro 0.15 % Soln (Azelastine Hcl) .... Two Spray in Each Nostril Daily 14)  Atrovent 0.03 % Soln (Ipratropium Bromide) .... Nasal Spray- 1 Spray in Each Nostril Two Times A Day 15)  Allegra-D 12 Hour 60-120 Mg Xr12h-Tab (Fexofenadine-Pseudoephedrine) .... One Tablet Two Time A Day X 10 Days. 16)  Veramyst 27.5 Mcg/spray Susp (Fluticasone Furoate) .... Two Spray in Each  Nostril Daily  Allergies (verified): 1)  ! Lipitor (Atorvastatin Calcium) 2)  ! * Statins  ( Crestor) 3)  Prednisone Intensol (Prednisone) 4)  Amoxicillin (Amoxicillin) 5)  Cellcept (Mycophenolate Mofetil) 6)  Cipro  Past History:  Family History: Last updated: 05/01/2009 n/c No FH of Colon Cancer:  Social History: Last updated: 05/01/2009 Retired Married Former Smoker Alcohol Use - no Illicit Drug Use - no Patient does not get regular exercise.  Daily Caffeine Use: occ  Risk Factors: Exercise: no (05/01/2009)  Risk Factors: Smoking Status: quit (05/05/2008)  Past medical, surgical, family and social histories (including risk factors) reviewed, and no changes noted (except as noted below).  Past Medical History: Reviewed history from 05/01/2009 and no changes required. Current Problems:  RLQ PAIN (ICD-789.03) COLITIS (ICD-558.9) ESOPHAGEAL  STRICTURE (ICD-530.3) GASTRITIS, CHRONIC (ICD-535.10) COLONIC POLYPS, HX OF (ICD-V12.72) DIVERTICULOSIS, COLON (ICD-562.10) ACTINIC KERATOSIS, HEAD (ICD-702.0) EYE FLOATERS (ICD-379.24) NECK PAIN (ICD-723.1) ESOPHAGEAL REFLUX (ICD-530.81) ECCHYMOSES, SPONTANEOUS (ICD-782.7) CHRONIC MAXILLARY SINUSITIS (ICD-473.0) BACK PAIN WITH RADICULOPATHY (ICD-729.2) HYPERLIPIDEMIA (ICD-272.4) R/O MULTIPLE  MYELOMA (ICD-203.00) DERMATITIS, ATOPIC (ICD-691.8) IRRITABLE BOWEL SYNDROME (ICD-564.1) NEUROPATHY, IDIOPATHIC PERIPHERAL NEC (ICD-356.8) B12 DEFICIENCY (ICD-266.2) KIDNEY DISEASE (ICD-593.9) ALLERGIC RHINITIS (ICD-477.9) HYPERTENSION (ICD-401.9)  Past Surgical History: Reviewed history from 08/11/2007 and no changes required. Inguinal herniorrhaphy Lumbar laminectomy Lumbar fusion Tonsillectomy  Family History: Reviewed history from 05/01/2009 and no changes required. n/c No FH of Colon Cancer:  Social History: Reviewed history from 05/01/2009 and no changes required. Retired Married Former Smoker Alcohol Use - no Illicit Drug Use - no Patient does not get regular exercise.  Daily Caffeine Use: occ  Review of Systems  The patient denies anorexia, fever, weight loss, weight gain, vision loss, decreased hearing, hoarseness, chest pain, syncope, dyspnea on exertion, peripheral edema, prolonged cough, headaches, hemoptysis, abdominal pain, melena, hematochezia, severe indigestion/heartburn, hematuria, incontinence, genital sores, muscle  weakness, suspicious skin lesions, transient blindness, difficulty walking, depression, unusual weight change, abnormal bleeding, enlarged lymph nodes, angioedema, and breast masses.         Flu Vaccine Consent Questions     Do you have a history of severe allergic reactions to this vaccine? no    Any prior history of allergic reactions to egg and/or gelatin? no    Do you have a sensitivity to the preservative Thimersol? no    Do you have a past  history of Guillan-Barre Syndrome? no    Do you currently have an acute febrile illness? no    Have you ever had a severe reaction to latex? no    Vaccine information given and explained to patient? yes    Are you currently pregnant? no    Lot Number:[id number]   Exp Date:05/16/2010   Site Given  Left Deltoid IM   Physical Exam  General:  Well-developed,well-nourished,in no acute distress; alert,appropriate and cooperative throughout examination Head:  Normocephalic and atraumatic. Eyes:  PERRLA, no icterus.exam deferred to patient's ophthalmologist.   Ears:  R ear normal and L ear normal.   Nose:  nasal dischargemucosal pallor, mucosal erythema, and mucosal edema.   Mouth:  pharyngeal exudate and posterior lymphoid hypertrophy.   Neck:  No deformities, masses, or tenderness noted. Chest Wall:  tender along the left lateral ribs, no crepitus Lungs:  Normal respiratory effort, chest expands symmetrically. Lungs are clear to auscultation, no crackles or wheezes. Heart:  Normal rate and regular rhythm. S1 and S2 normal without gallop, murmur, click, rub or other extra sounds. Abdomen:  Soft, nontender and nondistended. No masses, hepatosplenomegaly or hernias noted. Normal bowel sounds.I cannot appreciate any inguinal hernias or other abdominal wall abnormalities.   Impression & Recommendations:  Problem # 1:  ALLERGIC RHINITIS (ICD-477.9)  His updated medication list for this problem includes:    Astepro 0.15 % Soln (Azelastine hcl) .[name] Kitchen..[name] Kitchen Two spray in each nostril daily    Atrovent 0.03 % Soln (Ipratropium bromide) ..... Nasal spray- 1 spray in each nostril two times a day    Veramyst 27.5 Mcg/spray Susp (Fluticasone furoate) .[name] Kitchen..[name] Kitchen Two spray in each  nostril daily  Discussed use of allergy medications and environmental measures.   Problem # 2:  HYPERTENSION (ICD-401.9)  His updated medication list for this problem includes:    Nadolol 20 Mg Tabs (Nadolol) .[name] Kitchen... 1/2 every day     Losartan Potassium 50 Mg Tabs (Losartan potassium) ..... One by mouth daily    Indapamide 1.25 Mg Tabs (Indapamide) .[name] Kitchen... 1/ 2 once daily  BP today: 136/80 Prior BP: 136/80 (06/27/2009)  Prior 10 Yr Risk Heart Disease: Not enough information (03/27/2009)  Labs Reviewed: K+: 4.6 (03/27/2009) Creat: : 0.9 (05/03/2009)   Chol: 256 (03/27/2009)   HDL: 33.90 (03/27/2009)   LDL: DEL (02/15/2008)   TG: 278 (02/15/2008)  Complete Medication List: 1)  Cyanocobalamin 1000 Mcg/ml Soln (Cyanocobalamin) .... 1.5 ml every 3 weeks 2)  Diphenoxylate-atropine 2.5-0.025 Mg Tabs (Diphenoxylate-atropine) .... As needed 3)  Nadolol 20 Mg Tabs (Nadolol) .... 1/2 every day 4)  Losartan Potassium 50 Mg Tabs (Losartan potassium) .... One by mouth daily 5)  Indapamide 1.25 Mg Tabs (Indapamide) .... 1/ 2 once daily 6)  Centrum Silver Tabs (Multiple vitamins-minerals) .... One tablet by mouth once daily 7)  Fish Oil Oil (Fish oil) .... One tablet by mouth once daily 8)  Tylenol Extra Strength 500 Mg Tabs (Acetaminophen) .... As needed for pain 9)  Xifaxan 550  Mg  .... Take one tablet by mouth two times a day 10)  Align Caps (Misc intestinal flora regulat) .... Take one capsule by mouth daily 11)  Propoxyphene N-apap 100-325 Mg Tabs (Propoxyphene n-apap) .... One by mouth q 4-6 hours prn 12)  Clindamycin Phos-benzoyl Perox 1-5 % Gel (Clindamycin phos-benzoyl perox) .... Apply to face daily after washing 13)  Astepro 0.15 % Soln (Azelastine hcl) .... Two spray in each nostril daily 14)  Atrovent 0.03 % Soln (Ipratropium bromide) .... Nasal spray- 1 spray in each nostril two times a day 15)  Allegra-d 12 Hour 60-120 Mg Xr12h-tab (Fexofenadine-pseudoephedrine) .... One tablet two time a day x 10 days. 16)  Veramyst 27.5 Mcg/spray Susp (Fluticasone furoate) .... Two spray in each  nostril daily  Other Orders: Flu Vaccine 57yrs + (16109) Administration Flu vaccine - MCR (U0454)  Hypertension Assessment/Plan:       The patient's hypertensive risk group is category B: At least one risk factor (excluding diabetes) with no target organ damage.  Today's blood pressure is 136/80.  His blood pressure goal is < 125/75.  Patient Instructions: 1)  micartis 20 ( 1/2 40) is the same as losartin 50 2)  keep apointment Prescriptions: LOSARTAN POTASSIUM 50 MG TABS (LOSARTAN POTASSIUM) one by mouth daily  #90 x 0   Entered and Authorized by:   [name] MD   Signed by:   [name] MD on 08/20/2009   Method used:   Faxed to ...       [facility] (mail-order)             , [place]         Ph:        Fax: [phone]   RxID:   [id number]

## 2010-12-17 NOTE — Assessment & Plan Note (Signed)
Summary: b 12 inj/bonnye/mhf   Nurse Visit    Prior Medications: CYANOCOBALAMIN 1000 MCG/ML SOLN (CYANOCOBALAMIN) 1.5 ml every 3 weeks DIPHENOXYLATE-ATROPINE 2.5-0.025 MG TABS (DIPHENOXYLATE-ATROPINE) as needed LONGS ONE DAILY MULTI VITAMIN  TABS (MULTIPLE VITAMIN) Take 1 tablet by mouth once a day NADOLOL 20 MG TABS (NADOLOL) 1/2 every day HYZAAR 50-12.5 MG  TABS (LOSARTAN POTASSIUM-HCTZ) once daily BETAMETHASONE DIPROPIONATE AUG 0.05 %  OINT (AUG BETAMETHASONE DIPROPIONATE) apply to rash FLONASE 50 MCG/ACT  SUSP (FLUTICASONE PROPIONATE) one squirt R & L nostril bid MAXIFED DM 40-20-400 MG  TABS (PSEUDOEPHEDRINE-DM-GG) one by mouth q 6 hours Current Allergies: ! LIPITOR (ATORVASTATIN CALCIUM) ! * STATINS  ( CRESTOR) PREDNISONE INTENSOL (PREDNISONE) AMOXICILLIN (AMOXICILLIN) CELLCEPT (MYCOPHENOLATE MOFETIL) CIPRO    Medication Administration  Injection # 1:    Medication: Vit B12 1000 mcg    Diagnosis: B12 DEFICIENCY (ICD-266.2)    Route: IM    Site: L deltoid    Exp Date: 07/18/2009    Lot #: [id number]    Mfr: American Regent    Comments: 1.5 ml/1500 micrograms given    Patient tolerated injection without complications    Given by: Willy Eddy, LPN (June 13, 2008 12:41 PM)  Orders Added: 1)  Vit B12 1000 mcg [J3420] 2)  Admin of Therapeutic Inj  intramuscular or subcutaneous Lepidus.Putnam    ]

## 2010-12-17 NOTE — Progress Notes (Signed)
Summary: diarrhea   Phone Note Call from Patient Call back at Home Phone [phone]   Caller: Patient Call For: Dr. Jarold Motto Reason for Call: Talk to Nurse Summary of Call: pt reports still having diarrhea since COL beginning of this month Initial call taken by: Vallarie Mare,  December 10, 2009 8:56 AM  Follow-up for Phone Call        Talked with pt.  States diarrhea is still at problem esp in the mornings. This has been a problem for over a year.   Pt takes Lomotil daily.  This does help but pt is unsure if it isi Ok to take this all the time.  Any other suggestions.  last OV 10/30/10.  Had colonoscopy 11/21/09. Follow-up by: Ashok Cordia RN,  December 10, 2009 9:16 AM  Additional Follow-up for Phone Call Additional follow up Details #1::        OK TO TAKE as needed AND DAILY.[name] Kitchen Additional Follow-up by: Mardella Layman MD FACG,  December 10, 2009 11:43 AM    Additional Follow-up for Phone Call Additional follow up Details #2::    Pt notified. Follow-up by: Ashok Cordia RN,  December 10, 2009 11:53 AM

## 2010-12-17 NOTE — Assessment & Plan Note (Signed)
Summary: 3 WK ROV/NJR   Vital Signs:  Patient Profile:   75 Years Old Male Height:     72 inches Weight:      204 pounds Temp:     98.4 degrees F oral Pulse rate:   76 / minute Resp:     14 per minute BP sitting:   142 / 84  (left arm)  Vitals Entered By: Willy Eddy, LPN (September 05, 2008 12:18 PM)                 Chief Complaint:  roa - new med- micardis-- ptt contnues to c/o headaches-thinks it is sinusitis like sx--bp elevatd in am but goes down as day goes on.  History of Present Illness: blood pressure follow up persistant HA  Hypertension Follow-Up      This is a 75 year old man who presents for Hypertension follow-up.  The patient denies lightheadedness, urinary frequency, headaches, edema, impotence, rash, and fatigue.  Compliance with medications (by patient report) has been near 100%.  The patient reports that dietary compliance has been good.  The patient reports exercising 3-4X per week.      Current Allergies: ! LIPITOR (ATORVASTATIN CALCIUM) ! * STATINS  ( CRESTOR) PREDNISONE INTENSOL (PREDNISONE) AMOXICILLIN (AMOXICILLIN) CELLCEPT (MYCOPHENOLATE MOFETIL) CIPRO  Past Medical History:    Reviewed history from 03/13/2008 and no changes required:       Hypertension       B12 deff.       Allergic rhinitis       nephritis       neuropathy       Hyperlipidemia  Past Surgical History:    Reviewed history from 08/11/2007 and no changes required:       Inguinal herniorrhaphy       Lumbar laminectomy       Lumbar fusion       Tonsillectomy   Family History:    Reviewed history from 06/28/2007 and no changes required:       n/c  Social History:    Reviewed history from 05/05/2008 and no changes required:       Retired       Married              Former Smoker     Physical Exam  General:     Well-developed,well-nourished,in no acute distress; alert,appropriate and cooperative throughout examination Eyes:     No corneal or conjunctival  inflammation noted. EOMI. Perrla. Funduscopic exam benign, without hemorrhages, exudates or papilledema. Vision grossly normal. Nose:     nasal dischargemucosal pallor, mucosal erythema, and mucosal edema.   Neck:     No deformities, masses, or  tender and stiff Lungs:     Normal respiratory effort, chest expands symmetrically. Lungs are clear to auscultation, no crackles or wheezes. Heart:     normal rate, no murmur, and extrasystoles noted.   Abdomen:     soft, no distention, no masses, and no guarding.   Msk:     no joint warmth.   Pulses:     decreased in the lower extremity plus one Extremities:     1+ left pedal edema and 1+ right pedal edema.   Neurologic:     alert & oriented X3.  alert & oriented X3 and cranial nerves II-XII intact.      Impression & Recommendations:  Problem # 1:  HYPERTENSION (ICD-401.9)  His updated medication list for this problem includes:    Nadolol  20 Mg Tabs (Nadolol) .[name] Kitchen... 1/2 every day    Micardis 40 Mg Tabs (Telmisartan) ..... One by mouth daily at bed time  BP today: 142/84 Prior BP: 150/80 (08/29/2008)  Prior 10 Yr Risk Heart Disease: Not enough information (08/11/2007)  Labs Reviewed: Creat: 0.8 (03/13/2008) Chol: 419 (02/15/2008)   HDL: 74.0 (02/15/2008)   LDL: DEL (02/15/2008)   TG: 278 (02/15/2008)   Problem # 2:  HYPERLIPIDEMIA (ICD-272.4)  Labs Reviewed: Chol: 419 (02/15/2008)   HDL: 74.0 (02/15/2008)   LDL: DEL (02/15/2008)   TG: 278 (02/15/2008) SGOT: 25 (10/26/2007)   SGPT: 15 (10/26/2007)  Prior 10 Yr Risk Heart Disease: Not enough information (08/11/2007)   Problem # 3:  ALLERGIC RHINITIS (ICD-477.9)  His updated medication list for this problem includes:    Omnaris 50 Mcg/act Susp (Ciclesonide) .[name] Kitchen..[name] Kitchen Two spray in each nostril daily    [facility] Loratadine 10 Mg Tabs (Loratadine) .[name] Kitchen... 1 once daily    Xyzal 5 Mg Tabs (Levocetirizine dihydrochloride) ..... One by mouth daily Discussed use of allergy medications and  environmental measures.   Complete Medication List: 1)  Cyanocobalamin 1000 Mcg/ml Soln (Cyanocobalamin) .... 1.5 ml every 3 weeks 2)  Diphenoxylate-atropine 2.5-0.025 Mg Tabs (Diphenoxylate-atropine) .... As needed 3)  Longs One Daily Multi Vitamin Tabs (Multiple vitamin) .... Take 1 tablet by mouth once a day 4)  Nadolol 20 Mg Tabs (Nadolol) .... 1/2 every day 5)  Betamethasone Dipropionate Aug 0.05 % Oint (Aug betamethasone dipropionate) .... Apply to rash 6)  Omnaris 50 Mcg/act Susp (Ciclesonide) .... Two spray in each nostril daily 7)  [facility]vs Loratadine 10 Mg Tabs (Loratadine) .[name] Kitchen.. 1 once daily 8)  Micardis 40 Mg Tabs (Telmisartan) .... One by mouth daily at bed time 9)  Xyzal 5 Mg Tabs (Levocetirizine dihydrochloride) .... One by mouth daily  Other Orders: Vit B12 1000 mcg (J3420) Admin of Therapeutic Inj  intramuscular or subcutaneous (11914)   Patient Instructions: 1)  Please schedule a follow-up appointment in 1 month.   ]  Medication Administration  Injection # 1:    Medication: Vit B12 1000 mcg    Diagnosis: B12 DEFICIENCY (ICD-266.2)    Route: IM    Site: L deltoid    Exp Date: 02/25/2010    Lot #: [id number]    Mfr: American Regent    Comments: 1.5 mg/1`500 micrograms given    Patient tolerated injection without complications    Given by: Willy Eddy, LPN (September 05, 2008 12:20 PM)  Orders Added: 1)  Vit B12 1000 mcg [J3420] 2)  Admin of Therapeutic Inj  intramuscular or subcutaneous [96372] 3)  Est. Patient Level IV [78295]

## 2010-12-17 NOTE — Progress Notes (Signed)
Summary: sinus  Phone Note Call from Patient Call back at Home Phone [phone]   Caller: patient live Call For: [name] Summary of Call: Sinus infection would like something called in [facility] [address].  Initial call taken by: Roselle Locus,  December 08, 2007 10:02 AM  Follow-up for Phone Call        Spoke to pt and he is only c/o head congestion and bloody nose discharge. Stuffy. Allergic to Penicillin and Cipro. Please call when pharmacy has been called. Follow-up by: Lynann Beaver CMA,  December 08, 2007 1:11 PM  Additional Follow-up for Phone Call Additional follow up Details #1::        doxycycline 100 two times a day and allerX dose pk    New/Updated Medications: DOXYCYCLINE HYCLATE 100 MG  CAPS (DOXYCYCLINE HYCLATE) one by mouth BID ALLERX DOSE PACK   MISC (PSE-METHSCOP & CPM-PE-METHSCOP) take two times a day as directed  .  Prescriptions: ALLERX DOSE PACK   MISC (PSE-METHSCOP & CPM-PE-METHSCOP) take two times a day as directed  #1 x 0   Entered and Authorized by:   [name] MD   Signed by:   [name] MD on 12/08/2007   Method used:   Electronically sent to ...       [id number]       [address]       [place], [place]  [address]       Ph: [phone]       Fax: [phone]   RxID:   [id number] DOXYCYCLINE HYCLATE 100 MG  CAPS (DOXYCYCLINE HYCLATE) one by mouth BID  #20 x 0   Entered and Authorized by:   [name] MD   Signed by:   [name] MD on 12/08/2007   Method used:   Electronically sent to ...       [facility] [id number]       [address]       [place], [place]  [address]       Ph: [phone]       Fax: [phone]   RxID:   [id number]    Pt. notified.

## 2010-12-17 NOTE — Procedures (Signed)
Summary: Colonoscopy / [facility]  Colonoscopy / [facility]   Imported By: Maryln Gottron 02/18/2008 15:34:13  _____________________________________________________________________  External Attachment:    Type:   Image     Comment:   External Document

## 2010-12-17 NOTE — Letter (Signed)
Summary: request  request   Imported By: Kassie Mends 06/23/2008 09:46:00  _____________________________________________________________________  External Attachment:    Type:   Image     Comment:   request

## 2010-12-17 NOTE — Assessment & Plan Note (Signed)
Summary: b12 shot/bonnye/njr   Nurse Visit        Medication Administration  Injection # 1:    Medication: Vit B12 1000 mcg    Diagnosis: B12 DEFICIENCY (ICD-266.2)    Route: IM    Site: R deltoid    Exp Date: 09/22/2008    Lot #: [id number]    Mfr: american regent    Given by: Willy Eddy, LPN (June 07, 2007 12:16 PM)  Orders Added: 1)  Vit B12 1000 mcg [J3420] 2)  Admin of Therapeutic Inj  intramuscular or subcutaneous [90772]

## 2010-12-17 NOTE — Progress Notes (Signed)
Summary: WOULD LIKE TO HAVE CHOLESTORAL CHECKED   Phone Note Call from Patient Call back at Home Phone [phone]   Caller: PATIENT LIVE Call For: JENKINS Summary of Call: SAYS HE IS COMING IN FOR B 12 TOMORROW WITH BONNYE.  WOULD LIKE TO HAVE BLOOD DRAWEN TO HAVE HIS CHOLESTEROL CHECKED.   DR PAL THE KIDNEY DR IS REQUESTING THIS  COULD I HAVE AN ORDER TO MAKE HIM A LAB APPT  Initial call taken by: Roselle Locus,  February 14, 2008 8:28 AM  Follow-up for Phone Call        yes lipid 272.4 Follow-up by: [name] MD,  February 14, 2008 10:04 AM  Additional Follow-up for Phone Call Additional follow up Details #1::        please schedule-injection appointment is not scheduled either Additional Follow-up by: Willy Eddy, LPN,  February 14, 2008 10:26 AM    Additional Follow-up for Phone Call Additional follow up Details #2::    made appt for 10-31 at 10:15  ..................................................................[name] KitchenRoselle Locus  February 14, 2008 11:03 AM

## 2010-12-17 NOTE — Assessment & Plan Note (Signed)
Summary: b 12/bonnye/mhf  Nurse Visit    Prior Medications: CYANOCOBALAMIN 1000 MCG/ML SOLN (CYANOCOBALAMIN) Inject 1 mg intramuscularly DIPHENOXYLATE-ATROPINE 2.5-0.025 MG TABS (DIPHENOXYLATE-ATROPINE) as needed \\par  LONGS ONE DAILY MULTI VITAMIN  TABS (MULTIPLE VITAMIN) Take 1 tablet by mouth once a day NADOLOL 20 MG TABS (NADOLOL) 1/2 every day HYZAAR 50-12.5 MG  TABS (LOSARTAN POTASSIUM-HCTZ) once daily DESOWEN 0.05 %  CREA (DESONIDE) apply with eucerin cream 50/50 to hands Current Allergies: PREDNISONE INTENSOL (PREDNISONE) AMOXICILLIN (AMOXICILLIN) CELLCEPT (MYCOPHENOLATE MOFETIL) CIPRO (CIPROFLOXACIN)    Medication Administration  Injection # 1:    Medication: Vit B12 1000 mcg    Diagnosis: B12 DEFICIENCY (ICD-266.2)    Route: IM    Site: L deltoid    Exp Date: 07/18/2009    Lot #: [id number]    Mfr: American Regent    Patient tolerated injection without complications    Given by: Willy Eddy, LPN (February 15, 2008 10:12 AM)  Orders Added: 1)  Venipuncture [73532] 2)  TLB-Lipid Panel [80061-LIPID] 3)  Vit B12 1000 mcg [J3420] 4)  Admin of Therapeutic Inj  intramuscular or subcutaneous [email or web address].Putnam    ]

## 2010-12-17 NOTE — Assessment & Plan Note (Signed)
Summary: B-12 INJ/RCD   Nurse Visit   Allergies: 1)  ! Lipitor (Atorvastatin Calcium) 2)  ! * Statins  ( Crestor) 3)  Prednisone Intensol (Prednisone) 4)  Amoxicillin (Amoxicillin) 5)  Cellcept (Mycophenolate Mofetil) 6)  Cipro  Medication Administration  Injection # 1:    Medication: Vit B12 1000 mcg    Diagnosis: B12 DEFICIENCY (ICD-266.2)    Route: IM    Site: L deltoid    Exp Date: 03/17/2011    Lot #: [id number]    Mfr: American Regent    Comments: 1.9ml/1500mcg given    Patient tolerated injection without complications    Given by: Willy Eddy, LPN (July 18, 2009 12:11 PM)  Orders Added: 1)  Vit B12 1000 mcg [J3420] 2)  Admin of Therapeutic Inj  intramuscular or subcutaneous [86578]

## 2010-12-17 NOTE — Assessment & Plan Note (Signed)
Summary: URI?/dm   Vital Signs:  Patient Profile:   75 Years Old Male Height:     72 inches Weight:      201 pounds Temp:     97.7 degrees F oral BP sitting:   122 / 84  (left arm)  Vitals Entered By: Kern Reap CMA (May 05, 2008 1:45 PM)                 Chief Complaint:  congestion and drainage.  History of Present Illness: [name] is a 75 year old male, ex-smoker, who comes in today for evaluation of allergic rhinitis.  He has seasonally allergic rhinitis worse in the spring.  Two weekends ago.  He called because he was taking a decongestant and had urinary outlet obstruction.  He stopped the decongestant and the urinary outlet obstruction.  When away.  He is now not taking any medication except for the steroid nasal spray to 3 times a week.  He has no history of asthma.  He had an allergy workup by Dr. [place] Callas 5 years ago.  He does not recall the results.  It is not recall what he recommended him to do.    Current Allergies: ! LIPITOR (ATORVASTATIN CALCIUM) ! * STATINS  ( CRESTOR) PREDNISONE INTENSOL (PREDNISONE) AMOXICILLIN (AMOXICILLIN) CELLCEPT (MYCOPHENOLATE MOFETIL) CIPRO  Past Medical History:    Reviewed history from 03/13/2008 and no changes required:       Hypertension       B12 deff.       Allergic rhinitis       nephritis       neuropathy       Hyperlipidemia   Social History:    Reviewed history from 06/28/2007 and no changes required:       Retired       Married              Former Smoker   Risk Factors:  Tobacco use:  quit   Review of Systems      See HPI   Physical Exam  General:     Well-developed,well-nourished,in no acute distress; alert,appropriate and cooperative throughout examination Head:     Normocephalic and atraumatic without obvious abnormalities. No apparent alopecia or balding. Eyes:     No corneal or conjunctival inflammation noted. EOMI. Perrla. Funduscopic exam benign, without hemorrhages, exudates or  papilledema. Vision grossly normal. Ears:     External ear exam shows no significant lesions or deformities.  Otoscopic examination reveals clear canals, tympanic membranes are intact bilaterally without bulging, retraction, inflammation or discharge. Hearing is grossly normal bilaterally. Nose:     External nasal examination shows no deformity or inflammation. Nasal mucosa are pink and moist without lesions or exudates.septum in the midline Mouth:     Oral mucosa and oropharynx without lesions or exudates.  Teeth in good repair. Neck:     No deformities, masses, or tenderness noted. Chest Wall:     No deformities, masses, tenderness or gynecomastia noted. Lungs:     Normal respiratory effort, chest expands symmetrically. Lungs are clear to auscultation, no crackles or wheezes. Cervical Nodes:     No lymphadenopathy noted    Impression & Recommendations:  Problem # 1:  ALLERGIC RHINITIS (ICD-477.9) Assessment: Deteriorated  His updated medication list for this problem includes:    Flonase 50 Mcg/act Susp (Fluticasone propionate) ..... One squirt r & l nostril bid   Complete Medication List: 1)  Cyanocobalamin 1000 Mcg/ml Soln (Cyanocobalamin) .... 1.5 ml every  3 weeks 2)  Diphenoxylate-atropine 2.5-0.025 Mg Tabs (Diphenoxylate-atropine) .... As needed \\par  3)  Longs One Daily Multi Vitamin Tabs (Multiple vitamin) .... Take 1 tablet by mouth once a day 4)  Nadolol 20 Mg Tabs (Nadolol) .... 1/2 every day 5)  Hyzaar 50-12.5 Mg Tabs (Losartan potassium-hctz) .... Once daily 6)  Prednisone 10 Mg Tabs (Prednisone) .[name] Kitchen.. 1 once daily 7)  Gabapentin 100 Mg Caps (Gabapentin) .... 2 per day hold 8)  Betamethasone Dipropionate Aug 0.05 % Oint (Aug betamethasone dipropionate) .... Apply to rash 9)  Flonase 50 Mcg/act Susp (Fluticasone propionate) .... One squirt r & l nostril bid  Other Orders: Vit B12 1000 mcg (J3420) Admin of Therapeutic Inj  intramuscular or subcutaneous  (34742)   Patient Instructions: 1)  take 10 mg of Claritin in the morning or 10 mg of Zyrtec at bedtime remember not to use any decongestants. 2)  Use one squirt of the cortisone nasal spray up each nostril twice a day.  If after two weeks.  She does see an improvement consult with Dr. [name].  OR call Dr. [place]face Callas, your allergist for consultation   Prescriptions: FLONASE 50 MCG/ACT  SUSP (FLUTICASONE PROPIONATE) one squirt R & L nostril bid  #3 x 4   Entered and Authorized by:   Roderick Pee MD   Signed by:   Roderick Pee MD on 05/05/2008   Method used:   Print then Give to Patient   RxID:   [id number]  ]  Medication Administration  Injection # 1:    Medication: Vit B12 1000 mcg    Diagnosis: B12 DEFICIENCY (ICD-266.2)    Route: IM    Site: R deltoid    Exp Date: 07/18/2009    Lot #: [id number]    Mfr: American Regent    Patient tolerated injection without complications    Given by: Kern Reap CMA (May 05, 2008 2:25 PM)  Orders Added: 1)  Est. Patient Level III [66063] 2)  Vit B12 1000 mcg [J3420] 3)  Admin of Therapeutic Inj  intramuscular or subcutaneous [01601]

## 2010-12-17 NOTE — Consult Note (Signed)
Summary: Dr Lowell Guitar note  Dr Lowell Guitar note   Imported By: Kassie Mends 04/18/2008 09:54:31  _____________________________________________________________________  External Attachment:    Type:   Image     Comment:   Dr Lowell Guitar note

## 2010-12-17 NOTE — Assessment & Plan Note (Signed)
Summary: b12 inj/njr//pt r/s..slm   Nurse Visit   Allergies: 1)  ! Lipitor (Atorvastatin Calcium) 2)  ! * Statins  ( Crestor) 3)  Prednisone Intensol (Prednisone) 4)  Amoxicillin (Amoxicillin) 5)  Cellcept (Mycophenolate Mofetil) 6)  Cipro  Medication Administration  Injection # 1:    Medication: Vit B12 1000 mcg    Diagnosis: B12 DEFICIENCY (ICD-266.2)    Route: IM    Site: R deltoid    Exp Date: 08/16/2011    Lot #: [id number]    Mfr: American Regent    Patient tolerated injection without complications    Given by: Willy Eddy, LPN (April 22, 4400 11:52 AM)  Orders Added: 1)  Vit B12 1000 mcg [J3420] 2)  Admin of Therapeutic Inj  intramuscular or subcutaneous [02725]

## 2010-12-17 NOTE — Assessment & Plan Note (Signed)
Summary: elevated bp/bmw   Vital Signs:  Patient Profile:   75 Years Old Male Height:     72 inches Temp:     98.5 degrees F oral Pulse rate:   76 / minute Resp:     14 per minute BP sitting:   156 / 96  (left arm)  Vitals Entered By: Willy Eddy, LPN (September 20, 2008 12:29 PM)                 Chief Complaint:  roa from er- for elevated bp-added indapamide 1.25 qd and oxycodone 5/325 prn.    Current Allergies: ! LIPITOR (ATORVASTATIN CALCIUM) ! * STATINS  ( CRESTOR) PREDNISONE INTENSOL (PREDNISONE) AMOXICILLIN (AMOXICILLIN) CELLCEPT (MYCOPHENOLATE MOFETIL) CIPRO  Past Medical History:    Reviewed history from 03/13/2008 and no changes required:       Hypertension       B12 deff.       Allergic rhinitis       nephritis       neuropathy       Hyperlipidemia  Past Surgical History:    Reviewed history from 08/11/2007 and no changes required:       Inguinal herniorrhaphy       Lumbar laminectomy       Lumbar fusion       Tonsillectomy   Family History:    Reviewed history from 06/28/2007 and no changes required:       n/c  Social History:    Reviewed history from 05/05/2008 and no changes required:       Retired       Married              Former Smoker    Review of Systems  The patient denies anorexia, fever, weight loss, weight gain, vision loss, decreased hearing, hoarseness, chest pain, syncope, dyspnea on exertion, peripheral edema, prolonged cough, headaches, hemoptysis, abdominal pain, melena, hematochezia, severe indigestion/heartburn, hematuria, incontinence, genital sores, muscle weakness, suspicious skin lesions, transient blindness, difficulty walking, depression, unusual weight change, abnormal bleeding, enlarged lymph nodes, angioedema, and breast masses.     Physical Exam  General:     Well-developed,well-nourished,in no acute distress; alert,appropriate and cooperative throughout examination Eyes:     No corneal or conjunctival  inflammation noted. EOMI. Perrla. Funduscopic exam benign, without hemorrhages, exudates or papilledema. Vision grossly normal. Nose:     nasal dischargemucosal pallor, mucosal erythema, and mucosal edema.   Mouth:     pharyngeal exudate and posterior lymphoid hypertrophy.   Neck:     No deformities, masses, or  tender and stiff Lungs:     Normal respiratory effort, chest expands symmetrically. Lungs are clear to auscultation, no crackles or wheezes. Heart:     normal rate, no murmur, and extrasystoles noted.      Impression & Recommendations:  Problem # 1:  HYPERTENSION (ICD-401.9)  His updated medication list for this problem includes:    Nadolol 20 Mg Tabs (Nadolol) .[name] Kitchen... 1/2 every day    Micardis 40 Mg Tabs (Telmisartan) ..... One by mouth daily at bed time    Indapamide 1.25 Mg Tabs (Indapamide) .[name] Kitchen... 1 once daily  BP today: 156/96 Prior BP: 142/84 (09/05/2008)  Prior 10 Yr Risk Heart Disease: Not enough information (08/11/2007)  Labs Reviewed: Creat: 0.8 (03/13/2008) Chol: 419 (02/15/2008)   HDL: 74.0 (02/15/2008)   LDL: DEL (02/15/2008)   TG: 278 (02/15/2008)   Problem # 2:  NECK PAIN (ICD-723.1) arthritis in the  neck is the most like cause of the stress head aches His updated medication list for this problem includes:    Parafon Forte Dsc 500 Mg Tabs (Chlorzoxazone) .[name] Kitchen... 1 three times a day    Percocet 5-325 Mg Tabs (Oxycodone-acetaminophen) .[name] Kitchen... 1-2 every 6 hours prn    Tramadol Hcl 50 Mg Tabs (Tramadol hcl) .[name] Kitchen... Take one (1) tablet by mouth every 6 hours as needed for pain Discussed exercises and use of moist heat or cold and medication.   Complete Medication List: 1)  Cyanocobalamin 1000 Mcg/ml Soln (Cyanocobalamin) .... 1.5 ml every 3 weeks 2)  Diphenoxylate-atropine 2.5-0.025 Mg Tabs (Diphenoxylate-atropine) .... As needed 3)  Longs One Daily Multi Vitamin Tabs (Multiple vitamin) .... Take 1 tablet by mouth once a day 4)  Nadolol 20 Mg Tabs (Nadolol) ....  1/2 every day 5)  Betamethasone Dipropionate Aug 0.05 % Oint (Aug betamethasone dipropionate) .... Apply to rash 6)  Omnaris 50 Mcg/act Susp (Ciclesonide) .... Two spray in each nostril daily 7)  [facility] Loratadine 10 Mg Tabs (Loratadine) .[name] Kitchen.. 1 once daily 8)  Micardis 40 Mg Tabs (Telmisartan) .... One by mouth daily at bed time 9)  Xyzal 5 Mg Tabs (Levocetirizine dihydrochloride) .... One by mouth daily 10)  Parafon Forte Dsc 500 Mg Tabs (Chlorzoxazone) .[name] Kitchen.. 1 three times a day 11)  Indapamide 1.25 Mg Tabs (Indapamide) .[name] Kitchen.. 1 once daily 12)  Percocet 5-325 Mg Tabs (Oxycodone-acetaminophen) .[name] Kitchen.. 1-2 every 6 hours prn 13)  Tramadol Hcl 50 Mg Tabs (Tramadol hcl) .... Take one (1) tablet by mouth every 6 hours as needed for pain    Prescriptions: TRAMADOL HCL 50 MG TABS (TRAMADOL HCL) Take one (1) tablet by mouth every 6 hours as needed for pain  #60 x 3   Entered and Authorized by:   [name] MD   Signed by:   [name] MD on 09/20/2008   Method used:   Electronically to        [facility] [id number]* (retail)       [address] [place], [place]  [address]       Ph: [phone]       Fax: [phone]   RxID:   [id number]  ]

## 2010-12-17 NOTE — Assessment & Plan Note (Signed)
Summary: 3 month fup//ccm   Vital Signs:  Patient profile:   75 year old male Height:      72 inches Weight:      200 pounds BMI:     27.22 Temp:     98.2 degrees F oral Resp:     1468 per minute BP sitting:   124 / 70  (left arm)  Vitals Entered By: Willy Eddy, LPN (August 05, 2010 9:48 AM) CC: roa labs, Lipid Management Is Patient Diabetic? No   Primary Care Elizah Lydon:  Darryll Capers, MD  CC:  roa labs and Lipid Management.  History of Present Illness: and red yeast rice  Hyperlipidemia Follow-Up      This is a 75 year old man who presents for Hyperlipidemia follow-up.  The patient denies muscle aches, GI upset, abdominal pain, flushing, itching, constipation, diarrhea, and fatigue.  The patient denies the following symptoms: chest pain/pressure, exercise intolerance, dypsnea, palpitations, syncope, and pedal edema.  Compliance with medications (by patient report) has been near 100%.  Dietary compliance has been excellent.  The patient reports exercising daily.  Adjunctive measures currently used by the patient include fish oil supplements.    Lipid Management History:      Positive NCEP/ATP III risk factors include male age 73 years old or older, HDL cholesterol less than 40, and hypertension.  Negative NCEP/ATP III risk factors include non-tobacco-user status.     Preventive Screening-Counseling & Management  Alcohol-Tobacco     Smoking Status: quit     Packs/Day: 0.75     Year Started: 1964     Year Quit: 2003  Current Problems (verified): 1)  Acute Maxillary Sinusitis  (ICD-461.0) 2)  Gastroenteritis, Viral  (ICD-008.8) 3)  Acne Rosacea  (ICD-695.3) 4)  Chest Wall Pain, Acute  (ICD-786.52) 5)  Abdominal Pain Other Specified Site  (ICD-789.09) 6)  Elevated Sedimentation Rate  (ICD-790.1) 7)  Rlq Pain  (ICD-789.03) 8)  Colitis  (ICD-558.9) 9)  Esophageal Stricture  (ICD-530.3) 10)  Gastritis, Chronic  (ICD-535.10) 11)  Colonic Polyps, Hx of   (ICD-V12.72) 12)  Diverticulosis, Colon  (ICD-562.10) 13)  Actinic Keratosis, Head  (ICD-702.0) 14)  Eye Floaters  (ICD-379.24) 15)  Neck Pain  (ICD-723.1) 16)  Ecchymoses, Spontaneous  (ICD-782.7) 17)  Chronic Maxillary Sinusitis  (ICD-473.0) 18)  Back Pain With Radiculopathy  (ICD-729.2) 19)  Hyperlipidemia  (ICD-272.4) 20)  Dermatitis, Atopic  (ICD-691.8) 21)  Irritable Bowel Syndrome  (ICD-564.1) 22)  Neuropathy, Idiopathic Peripheral Nec  (ICD-356.8) 23)  B12 Deficiency  (ICD-266.2) 24)  Kidney Disease  (ICD-593.9) 25)  Allergic Rhinitis  (ICD-477.9) 26)  Hypertension  (ICD-401.9)  Current Medications (verified): 1)  Cyanocobalamin 1000 Mcg/ml Soln (Cyanocobalamin) .... 1.5 Ml Every 3 Weeks 2)  Diphenoxylate-Atropine 2.5-0.025 Mg Tabs (Diphenoxylate-Atropine) .... As Needed 3)  Nadolol 20 Mg Tabs (Nadolol) .... 1/2 Every Day 4)  Losartan Potassium 50 Mg Tabs (Losartan Potassium) .... One By Mouth Daily 5)  Indapamide 1.25 Mg Tabs (Indapamide) .... 1/ 2 Once Daily 6)  Centrum Silver  Tabs (Multiple Vitamins-Minerals) .... One Tablet By Mouth Once Daily 7)  Fish Oil   Oil (Fish Oil) .... One Tablet By Mouth Once Daily 8)  Tylenol Extra Strength 500 Mg Tabs (Acetaminophen) .... As Needed For Pain 9)  Clindamycin Phos-Benzoyl Perox 1-5 % Gel (Clindamycin Phos-Benzoyl Perox) .... Apply To Face Daily After Washing 10)  Astepro 0.15 % Soln (Azelastine Hcl) .... Two Spray in Each Nostril Daily 11)  [facility] Allergy Relief  10 Mg Tabs (Loratadine) .... One Tablet By Mouth Once Daily 12)  Red Yeast Rice 600 Mg Tabs (Red Yeast Rice Extract)  Allergies (verified): 1)  ! Lipitor (Atorvastatin Calcium) 2)  ! * Statins  ( Crestor) 3)  Prednisone Intensol (Prednisone) 4)  Amoxicillin (Amoxicillin) 5)  Cellcept (Mycophenolate Mofetil) 6)  Cipro  Review of Systems       Flu Vaccine Consent Questions     Do you have a history of severe allergic reactions to this vaccine? no    Any prior  history of allergic reactions to egg and/or gelatin? no    Do you have a sensitivity to the preservative Thimersol? no    Do you have a past history of Guillan-Barre Syndrome? no    Do you currently have an acute febrile illness? no    Have you ever had a severe reaction to latex? no    Vaccine information given and explained to patient? yes    Are you currently pregnant? no    Lot Number:[id number]   Exp Date:05/17/2011   Site Given  Left Deltoid IM    Impression & Recommendations:  Problem # 1:  HYPERLIPIDEMIA (ICD-272.4) the red rice yeast ans the fish oil has lowered the lipids 25% has be tolerates this better that the statins Labs Reviewed: SGOT: 22 (07/29/2010)   SGPT: 14 (07/29/2010)  Lipid Goals: Chol Goal: 200 (02/12/2010)   HDL Goal: 40 (02/12/2010)   LDL Goal: 130 (02/12/2010)   TG Goal: 150 (02/12/2010)  Prior 10 Yr Risk Heart Disease: Not enough information (03/27/2009)   HDL:38.10 (07/29/2010), 45.20 (02/12/2010)  LDL:DEL (02/15/2008)  Chol:217 (07/29/2010), 259 (02/12/2010)  Trig:132.0 (07/29/2010), 278 (02/15/2008)  Problem # 2:  CHRONIC MAXILLARY SINUSITIS (ICD-473.0) resume allergy protocols use the saline and the astepro The following medications were removed from the medication list:    Azithromycin 250 Mg Tabs (Azithromycin) .[name] Kitchen..[name] Kitchen Two by mouth now then one by mouth daily for 4 days His updated medication list for this problem includes:    Astepro 0.15 % Soln (Azelastine hcl) .[name] Kitchen..[name] Kitchen Two spray in each nostril daily  Take antibiotics for full duration. Discussed treatment options including indications for coronal CT scan of sinuses and ENT referral.   Problem # 3:  B12 DEFICIENCY (ICD-266.2) monitering of b12 Orders: Vit B12 1000 mcg (J3420) Admin of Therapeutic Inj  intramuscular or subcutaneous (78295)  Problem # 4:  OSTEOARTHRITIS, WRIST, RIGHT (ICD-715.13) Assessment: New  pennsaid 10 drops three times a day for 1 week His updated medication list  for this problem includes:    Tylenol Extra Strength 500 Mg Tabs (Acetaminophen) .[name] Kitchen... As needed for pain  Discussed use of medications, application of heat or cold, and exercises.   Complete Medication List: 1)  Cyanocobalamin 1000 Mcg/ml Soln (Cyanocobalamin) .... 1.5 ml every 3 weeks 2)  Diphenoxylate-atropine 2.5-0.025 Mg Tabs (Diphenoxylate-atropine) .... As needed 3)  Nadolol 20 Mg Tabs (Nadolol) .... 1/2 every day 4)  Losartan Potassium 50 Mg Tabs (Losartan potassium) .... One by mouth daily 5)  Indapamide 1.25 Mg Tabs (Indapamide) .... 1/ 2 once daily 6)  Centrum Silver Tabs (Multiple vitamins-minerals) .... One tablet by mouth once daily 7)  Fish Oil Oil (Fish oil) .... Two two times a day 8)  Tylenol Extra Strength 500 Mg Tabs (Acetaminophen) .... As needed for pain 9)  Clindamycin Phos-benzoyl Perox 1-5 % Gel (Clindamycin phos-benzoyl perox) .... Apply to face daily after washing 10)  Astepro 0.15 % Soln (Azelastine hcl) .... Two  spray in each nostril daily 11)  [facility]vs Allergy Relief 10 Mg Tabs (Loratadine) .... One tablet by mouth once daily 12)  Red Yeast Rice 600 Mg Tabs (Red yeast rice extract) .... Two two times a day 13)  Pennsaid 1.5 % Soln (Diclofenac sodium) .... Apply to wrsut as directed  Other Orders: Flu Vaccine 16yrs + MEDICARE PATIENTS (Z6109) Administration Flu vaccine - MCR (G0008)  Lipid Assessment/Plan:      Based on NCEP/ATP III, the patient's risk factor category is "2 or more risk factors and a calculated 10 year CAD risk of > 20%".  The patient's lipid goals are as follows: Total cholesterol goal is 200; LDL cholesterol goal is 130; HDL cholesterol goal is 40; Triglyceride goal is 150.    Patient Instructions: 1)  Please schedule a follow-up appointment in 3 months. 2)  Hepatic Panel prior to visit, ICD-9:995.20 3)  Lipid Panel prior to visit, ICD-9:272.4 Prescriptions: INDAPAMIDE 1.25 MG TABS (INDAPAMIDE) 1/ 2 once daily  #90 x 3   Entered and  Authorized by:   [name] MD   Signed by:   [name] MD on 08/05/2010   Method used:   Electronically to        [facility]* (retail)             ,          Ph: [phone]       Fax: [phone]   RxID:   [id number] POTASSIUM 50 MG TABS (LOSARTAN POTASSIUM) one by mouth daily  #90 x 3   Entered and Authorized by:   [name] MD   Signed by:   [name] MD on 08/05/2010   Method used:   Electronically to        [facility]* (retail)             ,          Ph: [phone]       Fax: [phone]   RxID:   [id number] NADOLOL 20 MG TABS (NADOLOL) 1/2 every day  #90 x 3   Entered and Authorized by:   [name] MD   Signed by:   [name] MD on 08/05/2010   Method used:   Electronically to        [facility]* (retail)             ,          Ph: [phone]       Fax: [phone]   RxID:   [id number]    Medication Administration  Injection # 1:    Medication: Vit B12 1000 mcg    Diagnosis: B12 DEFICIENCY (ICD-266.2)    Route: IM    Site: R deltoid    Exp Date: 06/16/2012    Lot #: [id number]    Mfr: American Regent    Comments: 1.5/`1531mcg given    Patient tolerated injection without complications    Given by: Willy Eddy, LPN (August 05, 2010 9:50 AM)  Orders Added: 1)  Flu Vaccine 82yrs + MEDICARE PATIENTS [Q2039] 2)  Administration Flu vaccine - MCR [G0008] 3)  Vit B12 1000 mcg [J3420] 4)  Admin of Therapeutic Inj  intramuscular or subcutaneous [96372] 5)  Est. Patient Level IV [31517]   Appended Document: 3 month fup//ccm     Allergies: 1)  ! Lipitor (Atorvastatin Calcium) 2)  ! * Statins  ( Crestor) 3)  Prednisone  Intensol (Prednisone) 4)  Amoxicillin (Amoxicillin) 5)  Cellcept (Mycophenolate Mofetil) 6)  Cipro  Review of Systems  The patient denies anorexia, fever, weight loss, weight gain, vision loss, decreased hearing, hoarseness, chest pain, syncope, dyspnea on exertion,  peripheral edema, prolonged cough, headaches, hemoptysis, abdominal pain, melena, hematochezia, severe indigestion/heartburn, hematuria, incontinence, genital sores, muscle weakness, suspicious skin lesions, transient blindness, difficulty walking, depression, unusual weight change, abnormal bleeding, enlarged lymph nodes, angioedema, breast masses, and testicular masses.    Physical Exam  General:  Well-developed,well-nourished,in no acute distress; alert,appropriate and cooperative throughout examination Head:  Normocephalic and atraumatic. Ears:  R ear normal and L ear normal.   Nose:  nasal dischargemucosal pallor, mucosal erythema, and mucosal edema.   Neck:  No deformities, masses, or tenderness noted. Lungs:  Normal respiratory effort, chest expands symmetrically. Lungs are clear to auscultation, no crackles or wheezes. Heart:  Normal rate and regular rhythm. S1 and S2 normal without gallop, murmur, click, rub or other extra sounds. Abdomen:  Bowel sounds positive,abdomen soft and non-tender without masses, organomegaly or hernias noted. Msk:  decreased ROM, joint tenderness, and joint swelling.  right hand   Complete Medication List: 1)  Cyanocobalamin 1000 Mcg/ml Soln (Cyanocobalamin) .... 1.5 ml every 3 weeks 2)  Diphenoxylate-atropine 2.5-0.025 Mg Tabs (Diphenoxylate-atropine) .... As needed 3)  Nadolol 20 Mg Tabs (Nadolol) .... 1/2 every day 4)  Losartan Potassium 50 Mg Tabs (Losartan potassium) .... One by mouth daily 5)  Indapamide 1.25 Mg Tabs (Indapamide) .... 1/ 2 once daily 6)  Centrum Silver Tabs (Multiple vitamins-minerals) .... One tablet by mouth once daily 7)  Fish Oil Oil (Fish oil) .... Two two times a day 8)  Tylenol Extra Strength 500 Mg Tabs (Acetaminophen) .... As needed for pain 9)  Clindamycin Phos-benzoyl Perox 1-5 % Gel (Clindamycin phos-benzoyl perox) .... Apply to face daily after washing 10)  Astepro 0.15 % Soln (Azelastine hcl) .... Two spray in each  nostril daily 11)  Cvs Allergy Relief 10 Mg Tabs (Loratadine) .... One tablet by mouth once daily 12)  Red Yeast Rice 600 Mg Tabs (Red yeast rice extract) .... Two two times a day 13)  Pennsaid 1.5 % Soln (Diclofenac sodium) .... Apply to wrsut as directed

## 2010-12-17 NOTE — Progress Notes (Signed)
Summary: phne note  Phone Note Call from Patient   Caller: Patient Call For: [name] MD Summary of Call: Took OTC decongestant per Bonnye's recommendation.  Now having decreased urine flow.  Told to stop decongestant, increase fluid intake and call next week.  Advised he should not take decongestant with D, but could take plain if needed. Initial call taken by: Roderick Pee MD,  April 29, 2008 9:03 AM

## 2010-12-17 NOTE — Consult Note (Signed)
Summary: [facility]   Imported By: Maryln Gottron 09/04/2008 13:12:31  _____________________________________________________________________  External Attachment:    Type:   Image     Comment:   External Document

## 2010-12-17 NOTE — Progress Notes (Signed)
Summary: head cngestion  Line busy x 3 05/05/2008  Phone Note Call from Patient Call back at [phone]   Caller: patient live Call For: [name] MD Summary of Call: Patient has head congestion,coughing up clear muscus.  He would like to know what he can take to clear it up.  [facility] [phone]. Initial call taken by: Celine Ahr,  May 05, 2008 9:20 AM  Follow-up for Phone Call        Schedule a work-in appt with Dr. Tawanna Cooler today. Follow-up by: Lynann Beaver CMA,  May 05, 2008 10:19 AM

## 2010-12-17 NOTE — Assessment & Plan Note (Signed)
Summary: b12 inj/dr jenkins/njr  Nurse Visit    Prior Medications: CYANOCOBALAMIN 1000 MCG/ML SOLN (CYANOCOBALAMIN) Inject 1 mg intramuscularly DIPHENOXYLATE-ATROPINE 2.5-0.025 MG TABS (DIPHENOXYLATE-ATROPINE) as needed \\par  LONGS ONE DAILY MULTI VITAMIN  TABS (MULTIPLE VITAMIN) Take 1 tablet by mouth once a day NADOLOL 20 MG TABS (NADOLOL) 1/2 every day XYZAL 5 MG  TABS (LEVOCETIRIZINE DIHYDROCHLORIDE) once daily HYZAAR 50-12.5 MG  TABS (LOSARTAN POTASSIUM-HCTZ) once daily LYRICA 50 MG  CAPS (PREGABALIN) on po q HS FISH OIL CONCENTRATE 1000 MG  CAPS (OMEGA-3 FATTY ACIDS) 2-4 capsules daily DESOWEN 0.05 %  CREA (DESONIDE) apply with eucerin cream 50/50 to hands NASACORT AQ 55 MCG/ACT  AERS (TRIAMCINOLONE ACETONIDE(NASAL)) two sprays q nostril daily Current Allergies: PREDNISONE INTENSOL (PREDNISONE) AMOXICILLIN (AMOXICILLIN) CELLCEPT (MYCOPHENOLATE MOFETIL) CIPRO (CIPROFLOXACIN)    Medication Administration  Injection # 1:    Medication: Vit B12 1000 mcg    Diagnosis: B12 DEFICIENCY (ICD-266.2)    Route: SQ    Site: R deltoid    Exp Date: 02/15/2009    Lot #: [id number]    Mfr: American Regent    Patient tolerated injection without complications    Given by: Lynann Beaver CMA (November 23, 2007 12:28 PM)  Orders Added: 1)  Vit B12 1000 mcg [J3420] 2)  Admin of Therapeutic Inj  intramuscular or subcutaneous Quintilian.[email or web address]    ]   Medication Administration  Injection # 1:    Medication: Vit B12 1000 mcg    Diagnosis: B12 DEFICIENCY (ICD-266.2)    Route: SQ    Site: R deltoid    Exp Date: 02/15/2009    Lot #: [id number]    Mfr: American Regent    Patient tolerated injection without complications    Given by: Lynann Beaver CMA (November 23, 2007 12:28 PM)  Orders Added: 1)  Vit B12 1000 mcg [J3420] 2)  Admin of Therapeutic Inj  intramuscular or subcutaneous [90772]

## 2010-12-17 NOTE — Assessment & Plan Note (Signed)
Summary: B-12 CJR rsc per pt/njr   Nurse Visit     Allergies: 1)  ! Lipitor (Atorvastatin Calcium) 2)  ! * Statins  ( Crestor) 3)  Prednisone Intensol (Prednisone) 4)  Amoxicillin (Amoxicillin) 5)  Cellcept (Mycophenolate Mofetil) 6)  Cipro     Medication Administration  Injection # 1:    Medication: Vit B12 1000 mcg    Diagnosis: B12 DEFICIENCY (ICD-266.2)    Route: IM    Site: R deltoid    Exp Date: 08/17/2010    Lot #: [id number]    Mfr: American Regent    Comments: 1.5 ml/1564mcg given    Patient tolerated injection without complications    Given by: Willy Eddy, LPN (May 09, 2009 12:30 PM)  Orders Added: 1)  Vit B12 1000 mcg [J3420] 2)  Admin of Therapeutic Inj  intramuscular or subcutaneous [16109]

## 2010-12-17 NOTE — Assessment & Plan Note (Signed)
Summary: sinus problems/mhf   Vital Signs:  Patient Profile:   75 Years Old Male Height:     72 inches Weight:      197 pounds Temp:     97.7 degrees F oral Pulse rate:   76 / minute Resp:     14 per minute BP sitting:   138 / 80  (left arm)  Vitals Entered By: Willy Eddy, LPN (August 15, 2008 12:16 PM)                 Chief Complaint:  roa- c/o gerd vs. post nasal drainage/ dermatologist had pt stop hyzaar due to lesion on skin 2 days ago.  Hypertension History:      He denies headache, chest pain, palpitations, dyspnea with exertion, orthopnea, PND, peripheral edema, visual symptoms, neurologic problems, syncope, and side effects from treatment.  Further comments include: missed does.        Positive major cardiovascular risk factors include male age 16 years old or older, hyperlipidemia, and hypertension.  Negative major cardiovascular risk factors include non-tobacco-user status.       Prior Medication List:  CYANOCOBALAMIN 1000 MCG/ML SOLN (CYANOCOBALAMIN) 1.5 ml every 3 weeks DIPHENOXYLATE-ATROPINE 2.5-0.025 MG TABS (DIPHENOXYLATE-ATROPINE) as needed LONGS ONE DAILY MULTI VITAMIN  TABS (MULTIPLE VITAMIN) Take 1 tablet by mouth once a day NADOLOL 20 MG TABS (NADOLOL) 1/2 every day HYZAAR 50-12.5 MG  TABS (LOSARTAN POTASSIUM-HCTZ) once daily BETAMETHASONE DIPROPIONATE AUG 0.05 %  OINT (AUG BETAMETHASONE DIPROPIONATE) apply to rash FLONASE 50 MCG/ACT  SUSP (FLUTICASONE PROPIONATE) one squirt R & L nostril bid [facility] LORATADINE 10 MG  TABS (LORATADINE) 1 once daily   Updated Prior Medication List: pt felt to have angioedma  and rash from the hyzaar... Current Allergies (reviewed today): ! LIPITOR (ATORVASTATIN CALCIUM) ! * STATINS  ( CRESTOR) PREDNISONE INTENSOL (PREDNISONE) AMOXICILLIN (AMOXICILLIN) CELLCEPT (MYCOPHENOLATE MOFETIL) CIPRO  Past Medical History:    Reviewed history from 03/13/2008 and no changes required:       Hypertension       B12  deff.       Allergic rhinitis       nephritis       neuropathy       Hyperlipidemia  Past Surgical History:    Reviewed history from 08/11/2007 and no changes required:       Inguinal herniorrhaphy       Lumbar laminectomy       Lumbar fusion       Tonsillectomy   Family History:    Reviewed history from 06/28/2007 and no changes required:       n/c  Social History:    Reviewed history from 05/05/2008 and no changes required:       Retired       Married              Former Smoker    Review of Systems  The patient denies anorexia, fever, weight loss, weight gain, vision loss, decreased hearing, hoarseness, chest pain, syncope, dyspnea on exertion, peripheral edema, prolonged cough, headaches, hemoptysis, abdominal pain, melena, hematochezia, severe indigestion/heartburn, hematuria, incontinence, genital sores, muscle weakness, suspicious skin lesions, transient blindness, difficulty walking, depression, unusual weight change, abnormal bleeding, enlarged lymph nodes, angioedema, breast masses, and testicular masses.     Physical Exam  General:     Well-developed,well-nourished,in no acute distress; alert,appropriate and cooperative throughout examination Head:     Normocephalic and atraumatic without obvious abnormalities. No apparent alopecia or balding. Eyes:  No corneal or conjunctival inflammation noted. EOMI. Perrla. Funduscopic exam benign, without hemorrhages, exudates or papilledema. Vision grossly normal. Nose:     External nasal examination shows no deformity or inflammation. Nasal mucosa are pink and moist without lesions or exudates.septum in the midline Mouth:     pharyngeal exudate and posterior lymphoid hypertrophy.   Neck:     No deformities, masses, or tenderness noted. Lungs:     Normal respiratory effort, chest expands symmetrically. Lungs are clear to auscultation, no crackles or wheezes. Heart:     normal rate, no murmur, and extrasystoles noted.       Impression & Recommendations:  Problem # 1:  Preventive Health Care (ICD-V70.0) Flu Vaccine Consent Questions     Do you have a history of severe allergic reactions to this vaccine? no    Any prior history of allergic reactions to egg and/or gelatin? no    Do you have a sensitivity to the preservative Thimersol? no    Do you have a past history of Guillan-Barre Syndrome? no    Do you currently have an acute febrile illness? no    Have you ever had a severe reaction to latex? no    Vaccine information given and explained to patient? yes    Are you currently pregnant? no    Lot Number:[id number]   Site Given  Left Deltoid IM  Reviewed preventive care protocols, scheduled due services, and updated immunizations.   Problem # 2:  HYPERTENSION (ICD-401.9) sun sensitive rash from the hyzaar? trial of low dose ace if blood presure require The following medications were removed from the medication list:    Hyzaar 50-12.5 Mg Tabs (Losartan potassium-hctz) ..... Once daily  His updated medication list for this problem includes:    Nadolol 20 Mg Tabs (Nadolol) .[name] Kitchen... 1/2 every day if blood pressure rises will give sample of atacand to take  BP today: 138/80 Prior BP: 120/70 (06/27/2008)  Prior 10 Yr Risk Heart Disease: Not enough information (08/11/2007)  Labs Reviewed: Creat: 0.8 (03/13/2008) Chol: 419 (02/15/2008)   HDL: 74.0 (02/15/2008)   LDL: DEL (02/15/2008)   TG: 278 (02/15/2008)   Problem # 3:  CHRONIC MAXILLARY SINUSITIS (ICD-473.0)  His updated medication list for this problem includes:    Flonase 50 Mcg/act Susp (Fluticasone propionate) ..... One squirt r & l nostril bid Take antibiotics for full duration. Discussed treatment options including indications for coronal CT scan of sinuses and ENT referral.   Problem # 4:  ESOPHAGEAL REFLUX (ICD-530.81)  His updated medication list for this problem includes:    Cimetidine 300 Mg Tabs (Cimetidine) ..... One by mouth  bid  Diagnostics Reviewed:  Discussed lifestyle modifications, diet, antacids/medications, and preventive measures. Handout provided.   Medications Added to Medication List This Visit: 1)  Cimetidine 300 Mg Tabs (Cimetidine) .... One by mouth bid  Complete Medication List: 1)  Cyanocobalamin 1000 Mcg/ml Soln (Cyanocobalamin) .... 1.5 ml every 3 weeks 2)  Diphenoxylate-atropine 2.5-0.025 Mg Tabs (Diphenoxylate-atropine) .... As needed 3)  Longs One Daily Multi Vitamin Tabs (Multiple vitamin) .... Take 1 tablet by mouth once a day 4)  Nadolol 20 Mg Tabs (Nadolol) .... 1/2 every day 5)  Betamethasone Dipropionate Aug 0.05 % Oint (Aug betamethasone dipropionate) .... Apply to rash 6)  Flonase 50 Mcg/act Susp (Fluticasone propionate) .... One squirt r & l nostril bid 7)  [facility] Loratadine 10 Mg Tabs (Loratadine) .[name] Kitchen.. 1 once daily 8)  Cimetidine 300 Mg Tabs (Cimetidine) .... One by mouth  bid  Other Orders: Flu Vaccine 52yrs + (16109) Administration Flu vaccine (U0454) Vit B12 1000 mcg (J3420) Admin of Therapeutic Inj  intramuscular or subcutaneous (09811)  Hypertension Assessment/Plan:      The patient's hypertensive risk group is category B: At least one risk factor (excluding diabetes) with no target organ damage.  Today's blood pressure is 138/80.  His blood pressure goal is < 125/75.   Patient Instructions: 1)  Please schedule a follow-up appointment in 3 weeks.   Prescriptions: CIMETIDINE 300 MG TABS (CIMETIDINE) one by mouth BID  #60 x 4   Entered and Authorized by:   [name] MD   Signed by:   [name] MD on 08/15/2008   Method used:   Electronically to        [facility] [address] [id number]* (retail)       [address] [place], [place]  [address]       Ph: [phone]       Fax: [phone]   RxID:   [id number]  ]  Medication Administration  Injection # 1:    Medication: Vit B12 1000 mcg    Diagnosis: B12 DEFICIENCY (ICD-266.2)    Route: IM     Site: R deltoid    Exp Date: 09/17/2009    Lot #: [id number]    Mfr: American Regent    Comments: gave 1.5/1500mcg    Patient tolerated injection without complications    Given by: Willy Eddy, LPN (August 15, 2008 12:27 PM)  Orders Added: 1)  Flu Vaccine 6yrs + [25956] 2)  Administration Flu vaccine [G0008] 3)  Vit B12 1000 mcg [J3420] 4)  Admin of Therapeutic Inj  intramuscular or subcutaneous [96372] 5)  Est. Patient Level IV [38756]

## 2010-12-17 NOTE — Assessment & Plan Note (Signed)
Summary: 2 month rov/njr   Vital Signs:  Patient Profile:   75 Years Old Male Height:     72 inches Weight:      188 pounds Temp:     98.6 degrees F oral Pulse rate:   72 / minute Resp:     14 per minute BP sitting:   124 / 78  (left arm)  Vitals Entered By: Willy Eddy, LPN (October 12, 2007 11:48 AM)                 Chief Complaint:  roa.  History of Present Illness: GERD HTN  Hypertension History:      He denies headache, chest pain, palpitations, dyspnea with exertion, orthopnea, PND, peripheral edema, visual symptoms, neurologic problems, syncope, and side effects from treatment.        Positive major cardiovascular risk factors include male age 47 years old or older and hypertension.  Negative major cardiovascular risk factors include non-tobacco-user status.     Current Allergies: PREDNISONE INTENSOL (PREDNISONE) AMOXICILLIN (AMOXICILLIN) CELLCEPT (MYCOPHENOLATE MOFETIL) CIPRO (CIPROFLOXACIN)  Past Medical History:    Reviewed history from 08/11/2007 and no changes required:       Hypertension       B12 deff.       Allergic rhinitis       nephritis       neuropathy         Past Surgical History:    Reviewed history from 08/11/2007 and no changes required:       Inguinal herniorrhaphy       Lumbar laminectomy       Lumbar fusion       Tonsillectomy   Family History:    Reviewed history from 06/28/2007 and no changes required:       n/c  Social History:    Reviewed history from 06/28/2007 and no changes required:       Retired       Married       Never Smoked    Review of Systems  The patient denies fever, weight loss, vision loss, hoarseness, chest pain, syncope, dyspnea on exhertion, peripheral edema, hemoptysis, abdominal pain, melena, and hematochezia.     Physical Exam  General:     Well-developed,well-nourished,in no acute distress; alert,appropriate and cooperative throughout examination Head:     Normocephalic and  atraumatic without obvious abnormalities. No apparent alopecia or balding. Ears:     External ear exam shows no significant lesions or deformities.  Otoscopic examination reveals clear canals, tympanic membranes are intact bilaterally without bulging, retraction, inflammation or discharge. Hearing is grossly normal bilaterally. Mouth:     Oral mucosa and oropharynx without lesions or exudates.  Teeth in good repair. Neck:     No deformities, masses, or tenderness noted. Lungs:     Normal respiratory effort, chest expands symmetrically. Lungs are clear to auscultation, no crackles or wheezes. Heart:     normal rate, no murmur, and extrasystoles noted.      Impression & Recommendations:  Problem # 1:  IRRITABLE BOWEL SYNDROME (ICD-564.1) has some diarrhea tht responds to one lomotil ( has discussed the used of the lonotil with Dr Eloise Harman)   Problem # 2:  DERMATITIS, ATOPIC (ICD-691.8)  His updated medication list for this problem includes:    Desowen 0.05 % Crea (Desonide) .[name] Kitchen... Apply with eucerin cream 50/50 to hands   Problem # 3:  NEUROPATHY, IDIOPATHIC PERIPHERAL NEC (ICD-356.8) persistant neuropathy B12 shots  Problem #  4:  KIDNEY DISEASE (ICD-593.9) hx of neuropathy with no increased swelling in feet  Problem # 5:  ALLERGIC RHINITIS (ICD-477.9)  His updated medication list for this problem includes:    Xyzal 5 Mg Tabs (Levocetirizine dihydrochloride) ..... Once daily    Nasacort Aq 55 Mcg/act Aers (Triamcinolone acetonide(nasal)) .[name] Kitchen..[name] Kitchen Two sprays q nostril daily   Medications Added to Medication List This Visit: 1)  Desowen 0.05 % Crea (Desonide) .... Apply with eucerin cream 50/50 to hands 2)  Nasacort Aq 55 Mcg/act Aers (Triamcinolone acetonide(nasal)) .... Two sprays q nostril daily  Complete Medication List: 1)  Cyanocobalamin 1000 Mcg/ml Soln (Cyanocobalamin) .... Inject 1 mg intramuscularly 2)  Diphenoxylate-atropine 2.5-0.025 Mg Tabs (Diphenoxylate-atropine)  .... As needed \\par  3)  Longs One Daily Multi Vitamin Tabs (Multiple vitamin) .... Take 1 tablet by mouth once a day 4)  Nadolol 20 Mg Tabs (Nadolol) .... 1/2 every day 5)  Xyzal 5 Mg Tabs (Levocetirizine dihydrochloride) .... Once daily 6)  Hyzaar 50-12.5 Mg Tabs (Losartan potassium-hctz) .... Once daily 7)  Lyrica 50 Mg Caps (Pregabalin) .... On po q hs 8)  Fish Oil Concentrate 1000 Mg Caps (Omega-3 fatty acids) .... 2-4 capsules daily 9)  Desowen 0.05 % Crea (Desonide) .... Apply with eucerin cream 50/50 to hands 10)  Nasacort Aq 55 Mcg/act Aers (Triamcinolone acetonide(nasal)) .... Two sprays q nostril daily  Other Orders: Vit B12 1000 mcg (J3420) Admin of Therapeutic Inj  intramuscular or subcutaneous (35361)  Hypertension Assessment/Plan:      The patient's hypertensive risk group is category B: At least one risk factor (excluding diabetes) with no target organ damage.  Today's blood pressure is 124/78.  His blood pressure goal is < 140/90.   Patient Instructions: 1)  Please schedule a follow-up appointment in 3 months. 2)  prior to visit draw immunoelectrophoresis and serum electrophoresis  ( 356.8 )    Prescriptions: DESOWEN 0.05 %  CREA (DESONIDE) apply with eucerin cream 50/50 to hands  #60 x 6   Entered and Authorized by:   [name] MD   Signed by:   [name] MD on 10/12/2007   Method used:   Print then Give to Patient   RxID:   [id number]  ]  Medication Administration  Injection # 1:    Medication: Vit B12 1000 mcg    Diagnosis: B12 DEFICIENCY (ICD-266.2)    Route: IM    Site: L deltoid    Exp Date: 09/17/2008    Lot #: [id number]    Patient tolerated injection without complications    Given by: Willy Eddy, LPN (October 12, 2007 11:51 AM)  Orders Added: 1)  Vit B12 1000 mcg [J3420] 2)  Admin of Therapeutic Inj  intramuscular or subcutaneous [90772] 3)  Est. Patient Level IV [71245]

## 2010-12-17 NOTE — Assessment & Plan Note (Signed)
Summary: lower abd pain/njr   Vital Signs:  Patient profile:   75 year old male Height:      72 inches Weight:      200 pounds BMI:     27.22 Temp:     98.2 degrees F oral Pulse rate:   68 / minute Resp:     14 per minute BP sitting:   136 / 84  (left arm)  Vitals Entered By: Willy Eddy, LPN (April 30, 2010 4:01 PM) CC: roa- c/o llq pain, Hypertension Management   Primary Care Provider:  Darryll Capers, MD  CC:  roa- c/o llq pain and Hypertension Management.  History of Present Illness: the pt has had 'red stools' with the red rice and has felt well otherwise with no problmes dr Daphine Deutscher noted mild abdominal wall weak spot without hernia seeing [facility] for GI seeing [facility] for renal dz ( mild nehprotic)  Hypertension History:      He denies headache, chest pain, palpitations, dyspnea with exertion, orthopnea, PND, peripheral edema, visual symptoms, neurologic problems, syncope, and side effects from treatment.        Positive major cardiovascular risk factors include male age 29 years old or older, hyperlipidemia, and hypertension.  Negative major cardiovascular risk factors include non-tobacco-user status.     Preventive Screening-Counseling & Management  Alcohol-Tobacco     Smoking Status: quit     Packs/Day: 0.75     Year Started: 1964     Year Quit: 2003  Problems Prior to Update: 1)  Gastroenteritis, Viral  (ICD-008.8) 2)  Acne Rosacea  (ICD-695.3) 3)  Chest Wall Pain, Acute  (ICD-786.52) 4)  Abdominal Pain Other Specified Site  (ICD-789.09) 5)  Elevated Sedimentation Rate  (ICD-790.1) 6)  Rlq Pain  (ICD-789.03) 7)  Colitis  (ICD-558.9) 8)  Esophageal Stricture  (ICD-530.3) 9)  Gastritis, Chronic  (ICD-535.10) 10)  Colonic Polyps, Hx of  (ICD-V12.72) 11)  Diverticulosis, Colon  (ICD-562.10) 12)  Actinic Keratosis, Head  (ICD-702.0) 13)  Eye Floaters  (ICD-379.24) 14)  Neck Pain  (ICD-723.1) 15)  Ecchymoses, Spontaneous  (ICD-782.7) 16)  Chronic  Maxillary Sinusitis  (ICD-473.0) 17)  Back Pain With Radiculopathy  (ICD-729.2) 18)  Hyperlipidemia  (ICD-272.4) 19)  Dermatitis, Atopic  (ICD-691.8) 20)  Irritable Bowel Syndrome  (ICD-564.1) 21)  Neuropathy, Idiopathic Peripheral Nec  (ICD-356.8) 22)  B12 Deficiency  (ICD-266.2) 23)  Kidney Disease  (ICD-593.9) 24)  Allergic Rhinitis  (ICD-477.9) 25)  Hypertension  (ICD-401.9)  Current Problems (verified): 1)  Gastroenteritis, Viral  (ICD-008.8) 2)  Acne Rosacea  (ICD-695.3) 3)  Chest Wall Pain, Acute  (ICD-786.52) 4)  Abdominal Pain Other Specified Site  (ICD-789.09) 5)  Elevated Sedimentation Rate  (ICD-790.1) 6)  Rlq Pain  (ICD-789.03) 7)  Colitis  (ICD-558.9) 8)  Esophageal Stricture  (ICD-530.3) 9)  Gastritis, Chronic  (ICD-535.10) 10)  Colonic Polyps, Hx of  (ICD-V12.72) 11)  Diverticulosis, Colon  (ICD-562.10) 12)  Actinic Keratosis, Head  (ICD-702.0) 13)  Eye Floaters  (ICD-379.24) 14)  Neck Pain  (ICD-723.1) 15)  Ecchymoses, Spontaneous  (ICD-782.7) 16)  Chronic Maxillary Sinusitis  (ICD-473.0) 17)  Back Pain With Radiculopathy  (ICD-729.2) 18)  Hyperlipidemia  (ICD-272.4) 19)  Dermatitis, Atopic  (ICD-691.8) 20)  Irritable Bowel Syndrome  (ICD-564.1) 21)  Neuropathy, Idiopathic Peripheral Nec  (ICD-356.8) 22)  B12 Deficiency  (ICD-266.2) 23)  Kidney Disease  (ICD-593.9) 24)  Allergic Rhinitis  (ICD-477.9) 25)  Hypertension  (ICD-401.9)  Medications Prior to Update: 1)  Cyanocobalamin 1000  Mcg/ml Soln (Cyanocobalamin) .... 1.5 Ml Every 3 Weeks 2)  Diphenoxylate-Atropine 2.5-0.025 Mg Tabs (Diphenoxylate-Atropine) .... As Needed 3)  Nadolol 20 Mg Tabs (Nadolol) .... 1/2 Every Day 4)  Losartan Potassium 50 Mg Tabs (Losartan Potassium) .... One By Mouth Daily 5)  Indapamide 1.25 Mg Tabs (Indapamide) .... 1/ 2 Once Daily 6)  Centrum Silver  Tabs (Multiple Vitamins-Minerals) .... One Tablet By Mouth Once Daily 7)  Fish Oil   Oil (Fish Oil) .... One Tablet By Mouth  Once Daily 8)  Tylenol Extra Strength 500 Mg Tabs (Acetaminophen) .... As Needed For Pain 9)  Xifaxan 550 Mg .... Take One Tablet By Mouth Two Times A Day 10)  Clindamycin Phos-Benzoyl Perox 1-5 % Gel (Clindamycin Phos-Benzoyl Perox) .... Apply To Face Daily After Washing 11)  Astepro 0.15 % Soln (Azelastine Hcl) .... Two Spray in Each Nostril Daily 12)  Cvs Allergy Relief 10 Mg Tabs (Loratadine) .... One Tablet By Mouth Once Daily 13)  Red Yeast Rice 600 Mg Tabs (Red Yeast Rice Extract)  Current Medications (verified): 1)  Cyanocobalamin 1000 Mcg/ml Soln (Cyanocobalamin) .... 1.5 Ml Every 3 Weeks 2)  Diphenoxylate-Atropine 2.5-0.025 Mg Tabs (Diphenoxylate-Atropine) .... As Needed 3)  Nadolol 20 Mg Tabs (Nadolol) .... 1/2 Every Day 4)  Losartan Potassium 50 Mg Tabs (Losartan Potassium) .... One By Mouth Daily 5)  Indapamide 1.25 Mg Tabs (Indapamide) .... 1/ 2 Once Daily 6)  Centrum Silver  Tabs (Multiple Vitamins-Minerals) .... One Tablet By Mouth Once Daily 7)  Fish Oil   Oil (Fish Oil) .... One Tablet By Mouth Once Daily 8)  Tylenol Extra Strength 500 Mg Tabs (Acetaminophen) .... As Needed For Pain 9)  Clindamycin Phos-Benzoyl Perox 1-5 % Gel (Clindamycin Phos-Benzoyl Perox) .... Apply To Face Daily After Washing 10)  Astepro 0.15 % Soln (Azelastine Hcl) .... Two Spray in Each Nostril Daily 11)  Cvs Allergy Relief 10 Mg Tabs (Loratadine) .... One Tablet By Mouth Once Daily 12)  Red Yeast Rice 600 Mg Tabs (Red Yeast Rice Extract)  Allergies (verified): 1)  ! Lipitor (Atorvastatin Calcium) 2)  ! * Statins  ( Crestor) 3)  Prednisone Intensol (Prednisone) 4)  Amoxicillin (Amoxicillin) 5)  Cellcept (Mycophenolate Mofetil) 6)  Cipro  Past History:  Family History: Last updated: 05/01/2009 n/c No FH of Colon Cancer:  Social History: Last updated: 05/01/2009 Retired Married Former Smoker Alcohol Use - no Illicit Drug Use - no Patient does not get regular exercise.  Daily  Caffeine Use: occ  Risk Factors: Exercise: no (05/01/2009)  Risk Factors: Smoking Status: quit (04/30/2010) Packs/Day: 0.75 (04/30/2010)  Past medical, surgical, family and social histories (including risk factors) reviewed, and no changes noted (except as noted below).  Past Medical History: Reviewed history from 05/01/2009 and no changes required. Current Problems:  RLQ PAIN (ICD-789.03) COLITIS (ICD-558.9) ESOPHAGEAL STRICTURE (ICD-530.3) GASTRITIS, CHRONIC (ICD-535.10) COLONIC POLYPS, HX OF (ICD-V12.72) DIVERTICULOSIS, COLON (ICD-562.10) ACTINIC KERATOSIS, HEAD (ICD-702.0) EYE FLOATERS (ICD-379.24) NECK PAIN (ICD-723.1) ESOPHAGEAL REFLUX (ICD-530.81) ECCHYMOSES, SPONTANEOUS (ICD-782.7) CHRONIC MAXILLARY SINUSITIS (ICD-473.0) BACK PAIN WITH RADICULOPATHY (ICD-729.2) HYPERLIPIDEMIA (ICD-272.4) R/O MULTIPLE  MYELOMA (ICD-203.00) DERMATITIS, ATOPIC (ICD-691.8) IRRITABLE BOWEL SYNDROME (ICD-564.1) NEUROPATHY, IDIOPATHIC PERIPHERAL NEC (ICD-356.8) B12 DEFICIENCY (ICD-266.2) KIDNEY DISEASE (ICD-593.9) ALLERGIC RHINITIS (ICD-477.9) HYPERTENSION (ICD-401.9)  Past Surgical History: Reviewed history from 08/11/2007 and no changes required. Inguinal herniorrhaphy Lumbar laminectomy Lumbar fusion Tonsillectomy  Family History: Reviewed history from 05/01/2009 and no changes required. n/c No FH of Colon Cancer:  Social History: Reviewed history from 05/01/2009 and no changes required.  Retired Married Former Smoker Alcohol Use - no Illicit Drug Use - no Patient does not get regular exercise.  Daily Caffeine Use: occ  Review of Systems  The patient denies anorexia, fever, weight loss, weight gain, vision loss, decreased hearing, hoarseness, chest pain, syncope, dyspnea on exertion, peripheral edema, prolonged cough, headaches, hemoptysis, abdominal pain, melena, hematochezia, severe indigestion/heartburn, hematuria, incontinence, genital sores, muscle weakness,  suspicious skin lesions, transient blindness, difficulty walking, depression, unusual weight change, abnormal bleeding, enlarged lymph nodes, angioedema, and breast masses.    Physical Exam  General:  Well-developed,well-nourished,in no acute distress; alert,appropriate and cooperative throughout examination Head:  Normocephalic and atraumatic. Eyes:  PERRLA, no icterus.exam deferred to patient's ophthalmologist.   Ears:  R ear normal and L ear normal.   Nose:  nasal dischargemucosal pallor, mucosal erythema, and mucosal edema.   Mouth:  pharyngeal exudate and posterior lymphoid hypertrophy.   Neck:  No deformities, masses, or tenderness noted. Lungs:  Normal respiratory effort, chest expands symmetrically. Lungs are clear to auscultation, no crackles or wheezes. Heart:  Normal rate and regular rhythm. S1 and S2 normal without gallop, murmur, click, rub or other extra sounds. Abdomen:  Bowel sounds positive,abdomen soft and non-tender without masses, organomegaly or hernias noted.   Impression & Recommendations:  Problem # 1:  HYPERLIPIDEMIA (ICD-272.4)  Labs Reviewed: SGOT: 23 (10/30/2009)   SGPT: 14 (10/30/2009)  Lipid Goals: Chol Goal: 200 (02/12/2010)   HDL Goal: 40 (02/12/2010)   LDL Goal: 130 (02/12/2010)   TG Goal: 150 (02/12/2010)  Prior 10 Yr Risk Heart Disease: Not enough information (03/27/2009)   HDL:45.20 (02/12/2010), 33.90 (03/27/2009)  LDL:DEL (02/15/2008)  Chol:259 (02/12/2010), 256 (03/27/2009)  Trig:278 (02/15/2008)  Problem # 2:  HYPERTENSION (ICD-401.9)  His updated medication list for this problem includes:    Nadolol 20 Mg Tabs (Nadolol) .[name] Kitchen... 1/2 every day    Losartan Potassium 50 Mg Tabs (Losartan potassium) ..... One by mouth daily    Indapamide 1.25 Mg Tabs (Indapamide) .[name] Kitchen... 1/ 2 once daily  BP today: 136/84 Prior BP: 140/80 (02/12/2010)  Prior 10 Yr Risk Heart Disease: Not enough information (03/27/2009)  Labs Reviewed: K+: 5.1  (02/12/2010) Creat: : 0.8 (02/12/2010)   Chol: 259 (02/12/2010)   HDL: 45.20 (02/12/2010)   LDL: DEL (02/15/2008)   TG: 278 (02/15/2008)  Problem # 3:  KIDNEY DISEASE (ICD-593.9) review the labs from [facility]  Complete Medication List: 1)  Cyanocobalamin 1000 Mcg/ml Soln (Cyanocobalamin) .... 1.5 ml every 3 weeks 2)  Diphenoxylate-atropine 2.5-0.025 Mg Tabs (Diphenoxylate-atropine) .... As needed 3)  Nadolol 20 Mg Tabs (Nadolol) .... 1/2 every day 4)  Losartan Potassium 50 Mg Tabs (Losartan potassium) .... One by mouth daily 5)  Indapamide 1.25 Mg Tabs (Indapamide) .... 1/ 2 once daily 6)  Centrum Silver Tabs (Multiple vitamins-minerals) .... One tablet by mouth once daily 7)  Fish Oil Oil (Fish oil) .... One tablet by mouth once daily 8)  Tylenol Extra Strength 500 Mg Tabs (Acetaminophen) .... As needed for pain 9)  Clindamycin Phos-benzoyl Perox 1-5 % Gel (Clindamycin phos-benzoyl perox) .... Apply to face daily after washing 10)  Astepro 0.15 % Soln (Azelastine hcl) .... Two spray in each nostril daily 11)  Cvs Allergy Relief 10 Mg Tabs (Loratadine) .... One tablet by mouth once daily 12)  Red Yeast Rice 600 Mg Tabs (Red yeast rice extract)  Hypertension Assessment/Plan:      The patient's hypertensive risk group is category B: At least one risk factor (excluding diabetes) with no  target organ damage.  Today's blood pressure is 136/84.  His blood pressure goal is < 125/75.  Patient Instructions: 1)  Please schedule a follow-up appointment in 3 months. 2)  Hepatic Panel prior to visit, ICD-9:995.20 3)  Lipid Panel prior to visit, ICD-9:272.4

## 2010-12-17 NOTE — Assessment & Plan Note (Signed)
Summary: b 12 inj/bonnye/mhf  Nurse Visit    Prior Medications: CYANOCOBALAMIN 1000 MCG/ML SOLN (CYANOCOBALAMIN) Inject 1 mg intramuscularly DIPHENOXYLATE-ATROPINE 2.5-0.025 MG TABS (DIPHENOXYLATE-ATROPINE) as needed \\par  LONGS ONE DAILY MULTI VITAMIN  TABS (MULTIPLE VITAMIN) Take 1 tablet by mouth once a day NADOLOL 20 MG TABS (NADOLOL) 1/2 every day HYZAAR 50-12.5 MG  TABS (LOSARTAN POTASSIUM-HCTZ) once daily DESOWEN 0.05 %  CREA (DESONIDE) apply with eucerin cream 50/50 to hands Current Allergies: PREDNISONE INTENSOL (PREDNISONE) AMOXICILLIN (AMOXICILLIN) CELLCEPT (MYCOPHENOLATE MOFETIL) CIPRO (CIPROFLOXACIN)    Medication Administration  Injection # 1:    Medication: Vit B12 1000 mcg    Diagnosis: B12 DEFICIENCY (ICD-266.2)    Route: IM    Site: R deltoid    Exp Date: 12/18/2008    Lot #: [id number]    Mfr: [employer] Regent    Patient tolerated injection without complications    Given by: Willy Eddy, LPN (March 07, 2008 12:15 PM)  Orders Added: 1)  Vit B12 1000 mcg [J3420] 2)  Admin of Therapeutic Inj  intramuscular or subcutaneous Lepidus.Putnam    ]

## 2010-12-17 NOTE — Assessment & Plan Note (Signed)
Summary: 2 month rov/njr   Vital Signs:  Patient Profile:   75 Years Old Male Height:     72 inches Weight:      196 pounds Temp:     98.2 degrees F oral Pulse rate:   76 / minute Resp:     14 per minute BP sitting:   120 / 70  (left arm)  Vitals Entered By: Willy Eddy, LPN (June 27, 2008 9:33 AM)                 Chief Complaint:  roa.  History of Present Illness:  Follow-Up Visit      This is a 75 year old man who presents for Follow-up visit.  chronic bruising on arms after predisone for renal dz.  The patient denies chest pain, palpitations, dizziness, syncope, low blood sugar symptoms, high blood sugar symptoms, edema, SOB, DOE, PND, and orthopnea.  Since the last visit the patient notes problems with medications.  The patient reports monitoring BP and dietary compliance.  When questioned about possible medication side effects, the patient notes none, rash, and muscle aches.      Current Allergies: ! LIPITOR (ATORVASTATIN CALCIUM) ! * STATINS  ( CRESTOR) PREDNISONE INTENSOL (PREDNISONE) AMOXICILLIN (AMOXICILLIN) CELLCEPT (MYCOPHENOLATE MOFETIL) CIPRO  Past Medical History:    Reviewed history from 03/13/2008 and no changes required:       Hypertension       B12 deff.       Allergic rhinitis       nephritis       neuropathy       Hyperlipidemia   Family History:    Reviewed history from 06/28/2007 and no changes required:       n/c  Social History:    Reviewed history from 05/05/2008 and no changes required:       Retired       Married              Former Smoker    Review of Systems  The patient denies anorexia, fever, weight loss, weight gain, vision loss, decreased hearing, hoarseness, chest pain, syncope, dyspnea on exertion, peripheral edema, prolonged cough, headaches, hemoptysis, abdominal pain, melena, hematochezia, severe indigestion/heartburn, hematuria, incontinence, genital sores, muscle weakness, suspicious skin lesions, transient  blindness, difficulty walking, depression, unusual weight change, abnormal bleeding, enlarged lymph nodes, angioedema, breast masses, and testicular masses.         easy brusing   Physical Exam  General:     Well-developed,well-nourished,in no acute distress; alert,appropriate and cooperative throughout examination Ears:     External ear exam shows no significant lesions or deformities.  Otoscopic examination reveals clear canals, tympanic membranes are intact bilaterally without bulging, retraction, inflammation or discharge. Hearing is grossly normal bilaterally. Nose:     External nasal examination shows no deformity or inflammation. Nasal mucosa are pink and moist without lesions or exudates.septum in the midline Mouth:     pharyngeal exudate and posterior lymphoid hypertrophy.   Neck:     No deformities, masses, or tenderness noted. Lungs:     Normal respiratory effort, chest expands symmetrically. Lungs are clear to auscultation, no crackles or wheezes. Heart:     normal rate, no murmur, and extrasystoles noted.   Abdomen:     soft, no distention, no masses, and no guarding.   Skin:     solar damage and ecchymosis.   Axillary Nodes:     No palpable lymphadenopathy Inguinal Nodes:  No significant adenopathy Psych:     Cognition and judgment appear intact. Alert and cooperative with normal attention span and concentration. No apparent delusions, illusions, hallucinations    Impression & Recommendations:  Problem # 1:  ECCHYMOSES, SPONTANEOUS (ICD-782.7) due to to the pred use  Problem # 2:  BACK PAIN WITH RADICULOPATHY (ICD-729.2) stablr  Problem # 3:  NEUROPATHY, IDIOPATHIC PERIPHERAL NEC (ICD-356.8) on B12 shots  Problem # 4:  DERMATITIS, ATOPIC (ICD-691.8)  His updated medication list for this problem includes:    Betamethasone Dipropionate Aug 0.05 % Oint (Aug betamethasone dipropionate) .[name] Kitchen... Apply to rash Discussed use of medication and avoidance of  irritating agents. Also stressed importance of moisturizers.   Complete Medication List: 1)  Cyanocobalamin 1000 Mcg/ml Soln (Cyanocobalamin) .... 1.5 ml every 3 weeks 2)  Diphenoxylate-atropine 2.5-0.025 Mg Tabs (Diphenoxylate-atropine) .... As needed 3)  Longs One Daily Multi Vitamin Tabs (Multiple vitamin) .... Take 1 tablet by mouth once a day 4)  Nadolol 20 Mg Tabs (Nadolol) .... 1/2 every day 5)  Hyzaar 50-12.5 Mg Tabs (Losartan potassium-hctz) .... Once daily 6)  Betamethasone Dipropionate Aug 0.05 % Oint (Aug betamethasone dipropionate) .... Apply to rash 7)  Flonase 50 Mcg/act Susp (Fluticasone propionate) .... One squirt r & l nostril bid 8)  Cvs Loratadine 10 Mg Tabs (Loratadine) .[name] Kitchen.. 1 once daily   Patient Instructions: 1)  for the easy bruising use a cream with vitamin c ( [employer], bath and body works) 2)  apply the cream twice a day 3)  Please schedule a follow-up appointment in 3 months.   Prescriptions: DIPHENOXYLATE-ATROPINE 2.5-0.025 MG TABS (DIPHENOXYLATE-ATROPINE) as needed  #30 x 5   Entered and Authorized by:   [name] MD   Signed by:   [name] MD on 06/27/2008   Method used:   Electronically sent to ...       [facility] [id number]       [address]       [place], [place]  [address]       Ph: [phone]       Fax: [phone]   RxID:   [id number]  ]

## 2010-12-17 NOTE — Progress Notes (Signed)
Summary: symptoms of lipitor   Phone Note Call from Patient   Caller: Patient Call For: Dr. [name] Summary of Call: Pt. started the Lipitor back and became nauseated again.  What to do? [phone] Initial call taken by: Lynann Beaver CMA,  Mar 21, 2008 12:09 PM  Follow-up for Phone Call        stopp the lipitor add to alergy list the only thing to replace is with fish oil two caps two times a day  1000mg    Pt. advised of Dr. Ernie Avena recommendation. Lynann Beaver CMA  Mar 21, 2008 2:06 PM Follow-up by: [name] MD,  Mar 21, 2008 1:48 PM   New Allergies: ! LIPITOR (ATORVASTATIN CALCIUM) New Allergies: ! LIPITOR (ATORVASTATIN CALCIUM)

## 2010-12-17 NOTE — Progress Notes (Signed)
Summary: ? fish oil  Phone Note Call from Patient   Caller: Patient Call For: Dr. Tawanna Cooler Summary of Call: Pt. was here yesterday for an office visit and Dr Tawanna Cooler told him to take Fish Oil.  He wants to know the best kind and how much and what to ask for at the pharmacy. [phone] Initial call taken by: Lynann Beaver CMA,  January 13, 2008 8:58 AM  Follow-up for Phone Call        fish oil concentrate 1000mg  per dr [name]. pt informed Follow-up by: Willy Eddy, LPN,  January 13, 2008 4:49 PM

## 2010-12-17 NOTE — Assessment & Plan Note (Signed)
Summary: b 12 inj/debbie/mhf ok per debbie   Nurse Visit    Prior Medications: CYANOCOBALAMIN 1000 MCG/ML SOLN (CYANOCOBALAMIN) 1.5 ml every 3 weeks DIPHENOXYLATE-ATROPINE 2.5-0.025 MG TABS (DIPHENOXYLATE-ATROPINE) as needed LONGS ONE DAILY MULTI VITAMIN  TABS (MULTIPLE VITAMIN) Take 1 tablet by mouth once a day NADOLOL 20 MG TABS (NADOLOL) 1/2 every day HYZAAR 50-12.5 MG  TABS (LOSARTAN POTASSIUM-HCTZ) once daily BETAMETHASONE DIPROPIONATE AUG 0.05 %  OINT (AUG BETAMETHASONE DIPROPIONATE) apply to rash FLONASE 50 MCG/ACT  SUSP (FLUTICASONE PROPIONATE) one squirt R & L nostril bid [facility] LORATADINE 10 MG  TABS (LORATADINE) 1 once daily Current Allergies: ! LIPITOR (ATORVASTATIN CALCIUM) ! * STATINS  ( CRESTOR) PREDNISONE INTENSOL (PREDNISONE) AMOXICILLIN (AMOXICILLIN) CELLCEPT (MYCOPHENOLATE MOFETIL) CIPRO    Medication Administration  Injection # 1:    Medication: Vit B12 1000 mcg    Diagnosis: B12 DEFICIENCY (ICD-266.2)    Route: SQ    Site: R deltoid    Exp Date: 09/19/2009    Lot #: [id number]    Mfr: American Regent    Patient tolerated injection without complications    Given by: Lynann Beaver CMA (July 25, 2008 9:52 AM)  Orders Added: 1)  Vit B12 1000 mcg [J3420] 2)  Admin of Therapeutic Inj  intramuscular or subcutaneous Lepidus.Putnam    ]  Medication Administration  Injection # 1:    Medication: Vit B12 1000 mcg    Diagnosis: B12 DEFICIENCY (ICD-266.2)    Route: SQ    Site: R deltoid    Exp Date: 09/19/2009    Lot #: [id number]    Mfr: American Regent    Patient tolerated injection without complications    Given by: Lynann Beaver CMA (July 25, 2008 9:52 AM)  Orders Added: 1)  Vit B12 1000 mcg [J3420] 2)  Admin of Therapeutic Inj  intramuscular or subcutaneous [34196]

## 2010-12-17 NOTE — Assessment & Plan Note (Signed)
Summary: B12 INJ/NJR   Nurse Visit   Allergies: 1)  ! Lipitor (Atorvastatin Calcium) 2)  ! * Statins  ( Crestor) 3)  Prednisone Intensol (Prednisone) 4)  Amoxicillin (Amoxicillin) 5)  Cellcept (Mycophenolate Mofetil) 6)  Cipro  Medication Administration  Injection # 1:    Medication: Vit B12 1000 mcg    Diagnosis: B12 DEFICIENCY (ICD-266.2)    Route: IM    Site: L deltoid    Exp Date: 02/15/2011    Lot #: [id number]    Mfr: American Regent    Comments: 1.34ml/1500 micrograms given    Patient tolerated injection without complications    Given by: Willy Eddy, LPN (August 29, 2009 1:43 PM)  Orders Added: 1)  Vit B12 1000 mcg [J3420] 2)  Admin of Therapeutic Inj  intramuscular or subcutaneous [[id number]]

## 2010-12-17 NOTE — Assessment & Plan Note (Signed)
Summary: b 12 inj/bonnye/mhf   Nurse Visit    Prior Medications: CYANOCOBALAMIN 1000 MCG/ML SOLN (CYANOCOBALAMIN) 1.5 ml every 3 weeks DIPHENOXYLATE-ATROPINE 2.5-0.025 MG TABS (DIPHENOXYLATE-ATROPINE) as needed LONGS ONE DAILY MULTI VITAMIN  TABS (MULTIPLE VITAMIN) Take 1 tablet by mouth once a day NADOLOL 20 MG TABS (NADOLOL) 1/2 every day BETAMETHASONE DIPROPIONATE AUG 0.05 %  OINT (AUG BETAMETHASONE DIPROPIONATE) apply to rash MICARDIS 40 MG TABS (TELMISARTAN) one by mouth daily at bed time INDAPAMIDE 1.25 MG TABS (INDAPAMIDE) 1/ 2 once daily Current Allergies: ! LIPITOR (ATORVASTATIN CALCIUM) ! * STATINS  ( CRESTOR) PREDNISONE INTENSOL (PREDNISONE) AMOXICILLIN (AMOXICILLIN) CELLCEPT (MYCOPHENOLATE MOFETIL) CIPRO    Medication Administration  Injection # 1:    Medication: Vit B12 1000 mcg    Diagnosis: B12 DEFICIENCY (ICD-266.2)    Route: IM    Site: R deltoid    Exp Date: 06/20/2010    Lot #: [id number]    Mfr: American Regent    Patient tolerated injection without complications    Given by: Willy Eddy, LPN (January 23, 8294 8:47 AM)  Orders Added: 1)  Vit B12 1000 mcg [J3420] 2)  Admin of Therapeutic Inj  intramuscular or subcutaneous Lepidus.Putnam    ]  Medication Administration  Injection # 1:    Medication: Vit B12 1000 mcg    Diagnosis: B12 DEFICIENCY (ICD-266.2)    Route: IM    Site: R deltoid    Exp Date: 06/20/2010    Lot #: [id number]    Mfr: American Regent    Patient tolerated injection without complications    Given by: Willy Eddy, LPN (January 24, 864 8:47 AM)  Orders Added: 1)  Vit B12 1000 mcg [J3420] 2)  Admin of Therapeutic Inj  intramuscular or subcutaneous [[id number]]

## 2010-12-17 NOTE — Progress Notes (Signed)
  Phone Note Call from Patient Call back at Home Phone [phone]   Caller: Patient Summary of Call: Has been seeing Dr. [name] for tension HAs. Was recently given Dolgic Plus to try. These help the HAs, but they are too sedating and keep him sleepy all the time. I advised him to stop the Dolgic Plus and to follow up with Dr. [name].  Initial call taken by: Nelwyn Salisbury MD,  September 07, 2008 8:23 AM

## 2010-12-17 NOTE — Progress Notes (Signed)
Summary: ADVICE FROM DR [name]  Phone Note Call from Patient   Summary of Call: "ON LYRICA, IT'S MAKING HIM SLEEPY...NEED DR [name] ADVICE"? Initial call taken by: Sid Falcon LPN,  May 19, 1609 1:47 PM  Follow-up for Phone Call        it will wear off in about two weeks Follow-up by: [name] MD,  May 20, 2007 3:30 PM  Additional Follow-up for Phone Call Additional follow up Details #1::        PT WANTED ME TO TELL DR JENKINS HE IS GOING TO STOP THE LYRICA.  HE WAS TAKING THE SAMPLES GIVEN BY HIS NEUROLOGIST, HAS NOT HAD THE RX FILLED YET. "I DO NOT WANT TO SLEEP ALL WEEKEND" PT WILL CALL BACK NEXT WEEK FOR ADDITIONAL ADVICE FROM HIM.   Additional Follow-up by: Sid Falcon LPN,  May 20, 9603 5:29 PM   Additional Follow-up for Phone Call Additional follow up Details #2::    give it more time per dr jj pt called Follow-up by: Rudy Jew, RN,  May 24, 2007 2:48 PM

## 2010-12-17 NOTE — Assessment & Plan Note (Signed)
Summary: pain in lower right side of stomach and back/mhf   Vital Signs:  Patient profile:   75 year old male Height:      72 inches Weight:      192 pounds BMI:     26.13 Temp:     98.2 degrees F oral Pulse rate:   76 / minute Resp:     14 per minute BP sitting:   136 / 80  (left arm)  Vitals Entered By: Willy Eddy, LPN (April 18, 1609 11:12 AM)  CC:  rt abd pain radiation around to back - to dr draper and ortho who did mri of back--ses murpohy and wainer report .  History of Present Illness: Back pain has stabilized the abdominal subsided has actinic keratosis on arrms and nose kril oil best  Hypertension History:      He denies headache, chest pain, palpitations, dyspnea with exertion, orthopnea, PND, peripheral edema, visual symptoms, neurologic problems, syncope, and side effects from treatment.        Positive major cardiovascular risk factors include male age 77 years old or older, hyperlipidemia, and hypertension.  Negative major cardiovascular risk factors include non-tobacco-user status.     Current Medications (verified): 1)  Cyanocobalamin 1000 Mcg/ml Soln (Cyanocobalamin) .... 1.5 Ml Every 3 Weeks 2)  Diphenoxylate-Atropine 2.5-0.025 Mg Tabs (Diphenoxylate-Atropine) .... As Needed 3)  Longs One Daily Multi Vitamin  Tabs (Multiple Vitamin) .... Take 1 Tablet By Mouth Once A Day 4)  Nadolol 20 Mg Tabs (Nadolol) .... 1/2 Every Day 5)  Betamethasone Dipropionate Aug 0.05 %  Oint (Aug Betamethasone Dipropionate) .... Apply To Rash 6)  Micardis 40 Mg Tabs (Telmisartan) .... One By Mouth Daily At Bed Time 7)  Indapamide 1.25 Mg Tabs (Indapamide) .... 1/ 2 Once Daily 8)  Cialis 5 Mg Tabs (Tadalafil) .... One By Mouth Daily As Needed  Allergies (verified): 1)  ! Lipitor (Atorvastatin Calcium) 2)  ! * Statins  ( Crestor) 3)  Prednisone Intensol (Prednisone) 4)  Amoxicillin (Amoxicillin) 5)  Cellcept (Mycophenolate Mofetil) 6)  Cipro  Past History:  Family  History: Last updated: 06/28/2007 n/c  Social History: Last updated: 05/05/2008 Retired Married  Former Smoker  Risk Factors: Smoking Status: quit (05/05/2008)  Past medical, surgical, family and social histories (including risk factors) reviewed, and no changes noted (except as noted below).  Past Medical History: Reviewed history from 03/13/2008 and no changes required. Hypertension B12 deff. Allergic rhinitis nephritis neuropathy Hyperlipidemia  Past Surgical History: Reviewed history from 08/11/2007 and no changes required. Inguinal herniorrhaphy Lumbar laminectomy Lumbar fusion Tonsillectomy  Family History: Reviewed history from 06/28/2007 and no changes required. n/c  Social History: Reviewed history from 05/05/2008 and no changes required. Retired Married  Former Smoker  Review of Systems  The patient denies anorexia, fever, weight loss, weight gain, vision loss, decreased hearing, hoarseness, chest pain, syncope, dyspnea on exertion, peripheral edema, prolonged cough, headaches, hemoptysis, abdominal pain, melena, hematochezia, severe indigestion/heartburn, hematuria, incontinence, genital sores, muscle weakness, suspicious skin lesions, transient blindness, difficulty walking, depression, unusual weight change, abnormal bleeding, enlarged lymph nodes, angioedema, and breast masses.    Physical Exam  General:  alert, well-hydrated, and pale.   Eyes:  pupils equal and pupils round.   Ears:  R ear normal and L ear normal.   Mouth:  pharyngeal exudate and posterior lymphoid hypertrophy.   Neck:  No deformities, masses, or  tender and stiff Lungs:  normal respiratory effort and no wheezes.   Heart:  normal rate and regular rhythm.   Abdomen:  soft, normal bowel sounds, and epigastric tenderness.   Msk:  no joint swelling and no joint warmth.     Impression & Recommendations:  Problem # 1:  B12 DEFICIENCY (ICD-266.2)  increased does to 1.5 cc q 3  weeks  Orders: Vit B12 1000 mcg (E4540) Admin of Therapeutic Inj  intramuscular or subcutaneous (98119)  Problem # 2:  ESOPHAGEAL REFLUX (ICD-530.81)  Labs Reviewed: Hgb: 14.1 (03/28/2008)   Hct: 41.7 (03/28/2008)  Problem # 3:  HYPERLIPIDEMIA (ICD-272.4) added fish oil intolerant of statins Labs Reviewed: SGOT: 25 (10/26/2007)   SGPT: 15 (10/26/2007)  Prior 10 Yr Risk Heart Disease: Not enough information (03/27/2009)   HDL:33.90 (03/27/2009), 74.0 (02/15/2008)  LDL:DEL (02/15/2008)  Chol:256 (03/27/2009), 419 (02/15/2008)  Trig:278 (02/15/2008)  Problem # 4:  NEUROPATHY, IDIOPATHIC PERIPHERAL NEC (ICD-356.8) increase the b12 dosing  Problem # 5:  ACTINIC KERATOSIS, HEAD (ICD-702.0) the lesion( s) was identifies as a     AK     and 40 seconds of cryotherapy with the liguid nitrogen gun was apllied to the site. The pt tolerated the procedure and post procedure care was discussed  on ear, face, nose and arm ( 4 lesons total)  Orders: Cryotherapy/Destruction benign or premalignant lesion (1st lesion)  (17000) Cryotherapy/Destruction benign or premalignant lesion (2nd-14th lesions) (17003)  Complete Medication List: 1)  Cyanocobalamin 1000 Mcg/ml Soln (Cyanocobalamin) .... 1.5 ml every 3 weeks 2)  Diphenoxylate-atropine 2.5-0.025 Mg Tabs (Diphenoxylate-atropine) .... As needed 3)  Longs One Daily Multi Vitamin Tabs (Multiple vitamin) .... Take 1 tablet by mouth once a day 4)  Nadolol 20 Mg Tabs (Nadolol) .... 1/2 every day 5)  Betamethasone Dipropionate Aug 0.05 % Oint (Aug betamethasone dipropionate) .... Apply to rash 6)  Micardis 40 Mg Tabs (Telmisartan) .... One by mouth daily at bed time 7)  Indapamide 1.25 Mg Tabs (Indapamide) .... 1/ 2 once daily 8)  Cialis 5 Mg Tabs (Tadalafil) .... One by mouth daily as needed  Hypertension Assessment/Plan:      The patient's hypertensive risk group is category B: At least one risk factor (excluding diabetes) with no target organ damage.   Today's blood pressure is 136/80.  His blood pressure goal is < 125/75.  Patient Instructions: 1)  Please schedule a follow-up appointment in 3 months. 2)  kril oil may be best   Medication Administration  Injection # 1:    Medication: Vit B12 1000 mcg    Diagnosis: B12 DEFICIENCY (ICD-266.2)    Route: IM    Site: L deltoid    Exp Date: 12/29/2009    Lot #: [id number]    Mfr: American Regent    Comments: 1500 mg    Patient tolerated injection without complications    Given by: [name] MD (April 18, 2009 11:36 AM)  Orders Added: 1)  Est. Patient Level IV [14782] 2)  Vit B12 1000 mcg [J3420] 3)  Admin of Therapeutic Inj  intramuscular or subcutaneous [96372] 4)  Cryotherapy/Destruction benign or premalignant lesion (1st lesion)  [17000] 5)  Cryotherapy/Destruction benign or premalignant lesion (2nd-14th lesions) [17003]

## 2010-12-17 NOTE — Progress Notes (Signed)
Summary: NEED TO RECOMMEND BACK DR  Phone Note Call from Patient Call back at Home Phone [phone]   Caller: Patient Call For: [name] MD Reason for Call: Acute Illness Summary of Call: PATIENT WANTS DR. [name] TO RECOMMEND A BACK DR. PLEASE CALL PT AT HOME BEFORE 2PM TODAY. PT GOING OUT OF TOWN.   Follow-up for Phone Call        Try [facility] and [facility]  Pt notified. Lynann Beaver CMA  Mar 30, 2009 9:41 AM Follow-up by: Willy Eddy, LPN,  Mar 30, 2009 9:36 AM

## 2010-12-17 NOTE — Assessment & Plan Note (Signed)
Summary: B-12 INJ   Nurse Visit   Allergies: 1)  ! Lipitor (Atorvastatin Calcium) 2)  ! * Statins  ( Crestor) 3)  Prednisone Intensol (Prednisone) 4)  Amoxicillin (Amoxicillin) 5)  Cellcept (Mycophenolate Mofetil) 6)  Cipro  Medication Administration  Injection # 1:    Medication: Vit B12 1000 mcg    Diagnosis: B12 DEFICIENCY (ICD-266.2)    Route: IM    Site: L deltoid    Exp Date: 10/16/2012    Lot #: [id number]    Mfr: American Regent    Patient tolerated injection without complications    Given by: Willy Eddy, LPN (Mar 26, 2010 12:04 PM)  Orders Added: 1)  Vit B12 1000 mcg [J3420] 2)  Admin of Therapeutic Inj  intramuscular or subcutaneous [[id number]]

## 2010-12-17 NOTE — Progress Notes (Signed)
Summary: NEED NEW RX  Phone Note Call from Patient Call back at Home Phone [phone]   Caller: Patient-LIVE CALL Call For: TRIAGE Summary of Call: WAS GIVEN CRESTOR 10MG  SAMPLES ON MONDAY. HAVING SIDE EFFECTS JUST LIKE THE LIPITOR. PLEASE CALL IN SOMETHING ELSE TO [facility] AT [phone]. Initial call taken by: Warnell Forester,  March 16, 2008 4:08 PM  Follow-up for Phone Call        Reviewed and forwarded to Dr [name]. Sid Falcon LPN  March 16, 2008 4:19 PM   Additional Follow-up for Phone Call Additional follow up Details #1::        Pt. called again complaining of nausea. [place] [phone] Pt.   [phone] Additional Follow-up by: Lynann Beaver CMA,  Mar 17, 2008 9:40 AM    Additional Follow-up for Phone Call Additional follow up Details #2::    stop the lipitor for 3 days then restart it, it may be that the nausia is from another causer if it goes away, restart the lipitor... if nausia occurs then he cannot tolerate it Follow-up by: [name] MD,  Mar 17, 2008 1:49 PM  Additional Follow-up for Phone Call Additional follow up Details #3:: Details for Additional Follow-up Action Taken: Pt. given Dr. [name] instructions. Additional Follow-up by: Lynann Beaver CMA,  Mar 17, 2008 1:58 PM

## 2010-12-17 NOTE — Consult Note (Signed)
Summary: Dr Lowell Guitar note  Dr Lowell Guitar note   Imported By: Kassie Mends 05/25/2008 10:29:40  _____________________________________________________________________  External Attachment:    Type:   Image     Comment:   Dr Lowell Guitar note

## 2010-12-17 NOTE — Assessment & Plan Note (Signed)
Summary: B12 W/BONNYE/CCM   Nurse Visit    Prior Medications: CYANOCOBALAMIN 1000 MCG/ML SOLN (CYANOCOBALAMIN) 1.5 ml every 3 weeks DIPHENOXYLATE-ATROPINE 2.5-0.025 MG TABS (DIPHENOXYLATE-ATROPINE) as needed \\par  LONGS ONE DAILY MULTI VITAMIN  TABS (MULTIPLE VITAMIN) Take 1 tablet by mouth once a day NADOLOL 20 MG TABS (NADOLOL) 1/2 every day HYZAAR 50-12.5 MG  TABS (LOSARTAN POTASSIUM-HCTZ) once daily DESOWEN 0.05 %  CREA (DESONIDE) apply with eucerin cream 50/50 to hands PREDNISONE 10 MG  TABS (PREDNISONE) 1 once daily GABAPENTIN 100 MG  CAPS (GABAPENTIN) 2 per day hold Current Allergies: ! LIPITOR (ATORVASTATIN CALCIUM) ! * STATINS  ( CRESTOR) PREDNISONE INTENSOL (PREDNISONE) AMOXICILLIN (AMOXICILLIN) CELLCEPT (MYCOPHENOLATE MOFETIL) CIPRO (CIPROFLOXACIN)    Medication Administration  Injection # 1:    Medication: Vit B12 1000 mcg    Diagnosis: B12 DEFICIENCY (ICD-266.2)    Route: IM    Site: L deltoid    Exp Date: 09/17/2009    Lot #: [id number]    Mfr: American Regent    Patient tolerated injection without complications    Given by: Lynann Beaver CMA (April 18, 2008 9:59 AM)  Orders Added: 1)  Vit B12 1000 mcg [J3420] 2)  Admin of Therapeutic Inj  intramuscular or subcutaneous Lepidus.Putnam    ]  Medication Administration  Injection # 1:    Medication: Vit B12 1000 mcg    Diagnosis: B12 DEFICIENCY (ICD-266.2)    Route: IM    Site: L deltoid    Exp Date: 09/17/2009    Lot #: [id number]    Mfr: American Regent    Patient tolerated injection without complications    Given by: Lynann Beaver CMA (April 18, 2008 9:59 AM)  Orders Added: 1)  Vit B12 1000 mcg [J3420] 2)  Admin of Therapeutic Inj  intramuscular or subcutaneous [[id number]]

## 2010-12-17 NOTE — Assessment & Plan Note (Signed)
Summary: chest congestion/njr   Vital Signs:  Patient profile:   75 year old male Weight:      194 pounds Temp:     98.1 degrees F oral Pulse rate:   74 / minute BP sitting:   130 / 84  (left arm) Cuff size:   regular  Vitals Entered By: Army Fossa CMA (June 15, 2009 11:09 AM) CC: chest congestion x 1 week.    History of Present Illness: Here for a week of intermittent sharp pains in he left lower ribs. No coughing or SOB or URI symptoms. No trauma but he has been doing some work around his house including patching drywall.   Allergies: 1)  ! Lipitor (Atorvastatin Calcium) 2)  ! * Statins  ( Crestor) 3)  Prednisone Intensol (Prednisone) 4)  Amoxicillin (Amoxicillin) 5)  Cellcept (Mycophenolate Mofetil) 6)  Cipro  Past History:  Past Medical History: Reviewed history from 05/01/2009 and no changes required. Current Problems:  RLQ PAIN (ICD-789.03) COLITIS (ICD-558.9) ESOPHAGEAL STRICTURE (ICD-530.3) GASTRITIS, CHRONIC (ICD-535.10) COLONIC POLYPS, HX OF (ICD-V12.72) DIVERTICULOSIS, COLON (ICD-562.10) ACTINIC KERATOSIS, HEAD (ICD-702.0) EYE FLOATERS (ICD-379.24) NECK PAIN (ICD-723.1) ESOPHAGEAL REFLUX (ICD-530.81) ECCHYMOSES, SPONTANEOUS (ICD-782.7) CHRONIC MAXILLARY SINUSITIS (ICD-473.0) BACK PAIN WITH RADICULOPATHY (ICD-729.2) HYPERLIPIDEMIA (ICD-272.4) R/O MULTIPLE  MYELOMA (ICD-203.00) DERMATITIS, ATOPIC (ICD-691.8) IRRITABLE BOWEL SYNDROME (ICD-564.1) NEUROPATHY, IDIOPATHIC PERIPHERAL NEC (ICD-356.8) B12 DEFICIENCY (ICD-266.2) KIDNEY DISEASE (ICD-593.9) ALLERGIC RHINITIS (ICD-477.9) HYPERTENSION (ICD-401.9)  Past Surgical History: Reviewed history from 08/11/2007 and no changes required. Inguinal herniorrhaphy Lumbar laminectomy Lumbar fusion Tonsillectomy  Review of Systems  The patient denies anorexia, fever, weight loss, weight gain, vision loss, decreased hearing, hoarseness, syncope, dyspnea on exertion, peripheral edema, prolonged cough,  headaches, hemoptysis, abdominal pain, melena, hematochezia, severe indigestion/heartburn, hematuria, incontinence, genital sores, muscle weakness, suspicious skin lesions, transient blindness, difficulty walking, depression, unusual weight change, abnormal bleeding, enlarged lymph nodes, angioedema, breast masses, and testicular masses.    Physical Exam  General:  Well-developed,well-nourished,in no acute distress; alert,appropriate and cooperative throughout examination Neck:  No deformities, masses, or tenderness noted. Chest Wall:  tender along the left lateral ribs, no crepitus Lungs:  Normal respiratory effort, chest expands symmetrically. Lungs are clear to auscultation, no crackles or wheezes. Heart:  Normal rate and regular rhythm. S1 and S2 normal without gallop, murmur, click, rub or other extra sounds. Skin:  Intact without suspicious lesions or rashes   Impression & Recommendations:  Problem # 1:  CHEST WALL PAIN, ACUTE (ICD-786.52)  Complete Medication List: 1)  Cyanocobalamin 1000 Mcg/ml Soln (Cyanocobalamin) .... 1.5 ml every 3 weeks 2)  Diphenoxylate-atropine 2.5-0.025 Mg Tabs (Diphenoxylate-atropine) .... As needed 3)  Nadolol 20 Mg Tabs (Nadolol) .... 1/2 every day 4)  Betamethasone Dipropionate Aug 0.05 % Oint (Aug betamethasone dipropionate) .... Apply to rash 5)  Micardis 40 Mg Tabs (Telmisartan) .... One by mouth daily at bed time 6)  Indapamide 1.25 Mg Tabs (Indapamide) .... 1/ 2 once daily 7)  Centrum Silver Tabs (Multiple vitamins-minerals) .... One tablet by mouth once daily 8)  Fish Oil Oil (Fish oil) .... One tablet by mouth once daily 9)  Tylenol Extra Strength 500 Mg Tabs (Acetaminophen) .... As needed for pain 10)  Xifaxan 550 Mg  .... Take one tablet by mouth two times a day 11)  Align Caps (Misc intestinal flora regulat) .... Take one capsule by mouth daily 12)  Propoxyphene N-apap 100-325 Mg Tabs (Propoxyphene n-apap) .... One by mouth q 4-6 hours  prn  Patient Instructions: 1)  This should resolve with  time. rest, heat, Darvocet as needed . 2)  Please schedule a follow-up appointment as needed .

## 2010-12-17 NOTE — Assessment & Plan Note (Signed)
Summary: B12INJ/NJR   Nurse Visit   Allergies: 1)  ! Lipitor (Atorvastatin Calcium) 2)  ! * Statins  ( Crestor) 3)  Prednisone Intensol (Prednisone) 4)  Amoxicillin (Amoxicillin) 5)  Cellcept (Mycophenolate Mofetil) 6)  Cipro  Medication Administration  Injection # 1:    Medication: Vit B12 1000 mcg    Diagnosis: B12 DEFICIENCY (ICD-266.2)    Route: IM    Site: R deltoid    Exp Date: 08/17/2010    Lot #: [id number]    Mfr: American Regent    Comments: 1.5 ML/1500MCG GIVEN    Patient tolerated injection without complications    Given by: Willy Eddy, LPN (May 30, 2009 11:52 AM)  Orders Added: 1)  Vit B12 1000 mcg [J3420] 2)  Admin of Therapeutic Inj  intramuscular or subcutaneous [[id number]]

## 2010-12-17 NOTE — Assessment & Plan Note (Signed)
Summary: back pain/dm   Vital Signs:  Patient Profile:   75 Years Old Male Height:     72 inches Weight:      199 pounds Temp:     98.2 degrees F oral Pulse rate:   72 / minute Resp:     14 per minute BP sitting:   120 / 70  (left arm)  Vitals Entered By: Willy Eddy, LPN (Mar 28, 2008 2:01 PM)                 Chief Complaint:  c/o nausea at night.  History of Present Illness: iNCEASED NAUSIA COUGH no wheezing gradually better no fever but feels weak in legs and feel that neuropathy os worse  Follow-Up Visit      This is a 75 year old man who presents for Follow-up visit.  The patient denies chest pain, palpitations, dizziness, syncope, low blood sugar symptoms, high blood sugar symptoms, edema, SOB, DOE, PND, and orthopnea.  The patient reports taking meds as prescribed.  When questioned about possible medication side effects, the patient notes nausea.      Current Allergies: ! LIPITOR (ATORVASTATIN CALCIUM) ! * STATINS  ( CRESTOR) PREDNISONE INTENSOL (PREDNISONE) AMOXICILLIN (AMOXICILLIN) CELLCEPT (MYCOPHENOLATE MOFETIL) CIPRO (CIPROFLOXACIN)  Past Medical History:    Reviewed history from 03/13/2008 and no changes required:       Hypertension       B12 deff.       Allergic rhinitis       nephritis       neuropathy       Hyperlipidemia  Past Surgical History:    Reviewed history from 08/11/2007 and no changes required:       Inguinal herniorrhaphy       Lumbar laminectomy       Lumbar fusion       Tonsillectomy   Family History:    Reviewed history from 06/28/2007 and no changes required:       n/c  Social History:    Reviewed history from 06/28/2007 and no changes required:       Retired       Married       Never Smoked    Review of Systems  The patient denies anorexia, fever, weight loss, weight gain, vision loss, decreased hearing, hoarseness, chest pain, syncope, dyspnea on exhertion, peripheral edema, prolonged cough, hemoptysis,  abdominal pain, melena, hematochezia, severe indigestion/heartburn, hematuria, incontinence, genital sores, muscle weakness, suspicious skin lesions, transient blindness, difficulty walking, depression, unusual weight change, abnormal bleeding, enlarged lymph nodes, and angioedema.     Physical Exam  General:     Well-developed,well-nourished,in no acute distress; alert,appropriate and cooperative throughout examination Head:     male-pattern balding.   Eyes:     pupils equal and pupils round.   Ears:     External ear exam shows no significant lesions or deformities.  Otoscopic examination reveals clear canals, tympanic membranes are intact bilaterally without bulging, retraction, inflammation or discharge. Hearing is grossly normal bilaterally. Nose:     no external deformity and mucosal edema.   Mouth:     Oral mucosa and oropharynx without lesions or exudates.  Teeth in good repair. Neck:     No deformities, masses, or tenderness noted. Lungs:     Normal respiratory effort, chest expands symmetrically. Lungs are clear to auscultation, no crackles or wheezes. Heart:     normal rate, no murmur, and extrasystoles noted.   Abdomen:  soft, no distention, no masses, and no guarding.   Msk:     spasm in back  Skin:     increased brusing    Impression & Recommendations:  Problem # 1:  NEUROPATHY, IDIOPATHIC PERIPHERAL NEC (ICD-356.8)  Problem # 2:  B12 DEFICIENCY (ICD-266.2) increased  does to 1.5 cc Orders: TLB-CBC Platelet - w/Differential (85025-CBCD)   Problem # 3:  ALLERGIC RHINITIS (ICD-477.9) Discussed use of allergy medications and environmental measures.   Problem # 4:  KIDNEY DISEASE (ICD-593.9) stable  Problem # 5:  HYPERLIPIDEMIA (ICD-272.4) fish oil since cannot tolerate the crestor The following medications were removed from the medication list:    Crestor 10 Mg Tabs (Rosuvastatin calcium) ..... One by mouth daily  Labs Reviewed: Chol: 419  (02/15/2008)   HDL: 74.0 (02/15/2008)   LDL: DEL (02/15/2008)   TG: 278 (02/15/2008) SGOT: 25 (10/26/2007)   SGPT: 15 (10/26/2007)  Prior 10 Yr Risk Heart Disease: Not enough information (08/11/2007)   Complete Medication List: 1)  Cyanocobalamin 1000 Mcg/ml Soln (Cyanocobalamin) .... Inject 1 mg intramuscularly 2)  Diphenoxylate-atropine 2.5-0.025 Mg Tabs (Diphenoxylate-atropine) .... As needed \\par  3)  Longs One Daily Multi Vitamin Tabs (Multiple vitamin) .... Take 1 tablet by mouth once a day 4)  Nadolol 20 Mg Tabs (Nadolol) .... 1/2 every day 5)  Hyzaar 50-12.5 Mg Tabs (Losartan potassium-hctz) .... Once daily 6)  Desowen 0.05 % Crea (Desonide) .... Apply with eucerin cream 50/50 to hands 7)  Prednisone 10 Mg Tabs (Prednisone) .[name] Kitchen.. 1 once daily 8)  Gabapentin 100 Mg Caps (Gabapentin) .... 2 per day hold   Patient Instructions: 1)  Please schedule a follow-up appointment in 1 month.   ]  Appended Document: Orders Update     Clinical Lists Changes  Orders: Added new Service order of Vit B12 1000 mcg ([id number]) - Signed Added new Service order of Admin of Therapeutic Inj  intramuscular or subcutaneous ([id number]) - Signed        Medication Administration  Injection # 1:    Medication: Vit B12 1000 mcg    Diagnosis: B12 DEFICIENCY (ICD-266.2)    Route: IM    Site: L deltoid    Exp Date: 07/18/2009    Lot #: [id number]    Mfr: American Regent    Patient tolerated injection without complications    Given by: Willy Eddy, LPN (Mar 28, 2008 3:37 PM)  Orders Added: 1)  Vit B12 1000 mcg [J3420] 2)  Admin of Therapeutic Inj  intramuscular or subcutaneous [[id number]]

## 2010-12-17 NOTE — Assessment & Plan Note (Signed)
Summary: B12 SHOT/JLS  Nurse Visit    Prior Medications: CYANOCOBALAMIN 1000 MCG/ML SOLN (CYANOCOBALAMIN) Inject 1 mg intramuscularly DIPHENOXYLATE-ATROPINE 2.5-0.025 MG TABS (DIPHENOXYLATE-ATROPINE) as needed \\par  LONGS ONE DAILY MULTI VITAMIN  TABS (MULTIPLE VITAMIN) Take 1 tablet by mouth once a day NADOLOL 20 MG TABS (NADOLOL) 1/2 every day HYZAAR 50-12.5 MG  TABS (LOSARTAN POTASSIUM-HCTZ) once daily DESOWEN 0.05 %  CREA (DESONIDE) apply with eucerin cream 50/50 to hands Current Allergies: PREDNISONE INTENSOL (PREDNISONE) AMOXICILLIN (AMOXICILLIN) CELLCEPT (MYCOPHENOLATE MOFETIL) CIPRO (CIPROFLOXACIN)    Medication Administration  Injection # 1:    Medication: Vit B12 1000 mcg    Diagnosis: B12 DEFICIENCY (ICD-266.2)    Route: IM    Site: R deltoid    Exp Date: 12/18/2008    Lot #: [id number]    Mfr: American Regent    Patient tolerated injection without complications    Given by: Willy Eddy, LPN (January 25, 2008 12:10 PM)  Orders Added: 1)  Vit B12 1000 mcg [J3420] 2)  Admin of Therapeutic Inj  intramuscular or subcutaneous [email or web address]    ]

## 2010-12-17 NOTE — Progress Notes (Signed)
Summary: fatigue  Phone Note From Other Clinic   Caller: Patient Call For: Dr. [name] Summary of Call: Pt is calling asking if his fatigue could be coming from Indapamide 1.25 mg that he is taking .[name] Kitchen..one by mouth daily. [phone] Initial call taken by: Lynann Beaver CMA,  October 10, 2008 3:22 PM Summary of Call: Pt given Dr. York Ram recommendations. Initial call taken by: Lynann Beaver CMA,  October 11, 2008 8:44 AM  Follow-up for Phone Call        pt has been on med for only a few weeks any medication take several weeks to months to get used too... give it time. Follow-up by: [name] MD,  October 11, 2008 8:24 AM

## 2010-12-17 NOTE — Consult Note (Signed)
Summary: Dr Lowell Guitar note  Dr Lowell Guitar note   Imported By: Kassie Mends 02/21/2008 08:20:09  _____________________________________________________________________  External Attachment:    Type:   Image     Comment:   Dr Lowell Guitar note

## 2010-12-17 NOTE — Assessment & Plan Note (Signed)
Summary: b12 shot/jls   Nurse Visit    Prior Medications: CYANOCOBALAMIN 1000 MCG/ML SOLN (CYANOCOBALAMIN) 1.5 ml every 3 weeks DIPHENOXYLATE-ATROPINE 2.5-0.025 MG TABS (DIPHENOXYLATE-ATROPINE) as needed LONGS ONE DAILY MULTI VITAMIN  TABS (MULTIPLE VITAMIN) Take 1 tablet by mouth once a day NADOLOL 20 MG TABS (NADOLOL) 1/2 every day BETAMETHASONE DIPROPIONATE AUG 0.05 %  OINT (AUG BETAMETHASONE DIPROPIONATE) apply to rash OMNARIS 50 MCG/ACT SUSP (CICLESONIDE) two spray in each nostril daily [facility] LORATADINE 10 MG  TABS (LORATADINE) 1 once daily MICARDIS 40 MG TABS (TELMISARTAN) one by mouth daily at bed time XYZAL 5 MG TABS (LEVOCETIRIZINE DIHYDROCHLORIDE) one by mouth daily PARAFON FORTE DSC 500 MG TABS (CHLORZOXAZONE) 1 three times a day INDAPAMIDE 1.25 MG TABS (INDAPAMIDE) 1/ 2 once daily PERCOCET 5-325 MG TABS (OXYCODONE-ACETAMINOPHEN) 1-2 every 6 hours prn PROPOXYPHENE N-APAP 100-325 MG TABS (PROPOXYPHENE N-APAP) 1-2 every 4-6-hours as needed pain Current Allergies: ! LIPITOR (ATORVASTATIN CALCIUM) ! * STATINS  ( CRESTOR) PREDNISONE INTENSOL (PREDNISONE) AMOXICILLIN (AMOXICILLIN) CELLCEPT (MYCOPHENOLATE MOFETIL) CIPRO    Medication Administration  Injection # 1:    Medication: Vit B12 1000 mcg    Diagnosis: B12 DEFICIENCY (ICD-266.2)    Route: IM    Site: R deltoid    Exp Date: 06/17/2009    Lot #: [id number]    Mfr: American Regent    Comments: 1.5 ml/1500 mcg    Patient tolerated injection without complications    Given by: Willy Eddy, LPN (November 07, 2008 8:50 AM)  Orders Added: 1)  Vit B12 1000 mcg [J3420] 2)  Admin of Therapeutic Inj  intramuscular or subcutaneous Lepidus.Putnam    ]

## 2010-12-17 NOTE — Progress Notes (Signed)
Summary: after hours- congestion  Phone Note Call from Patient   Caller: Patient Summary of Call: pt calls to report 2 weeks of cold sxs.  using nasal spray but 'today's the worst'.  no fever.  reports 'it's all in my head'.  wants to know what to do.  suggested pt use mucinex to thin drainage and call for appt in AM.  told him that calling in abx over the phone w/out being seen isn't appropriate- pt thinks he'll go to [facility] today for meds.  told him he has that option. Initial call taken by: Neena Rhymes MD,  July 08, 2009 10:58 AM

## 2010-12-17 NOTE — Assessment & Plan Note (Signed)
Summary: 4 MONTH ROV/NJR   Vital Signs:  Patient profile:   75 year old male Height:      72 inches Weight:      191 pounds BMI:     26.00 Temp:     98.3 degrees F oral Pulse rate:   72 / minute Resp:     14 per minute BP sitting:   120 / 82  (left arm)  Vitals Entered By: Willy Eddy, LPN (Mar 27, 2009 9:00 AM)  CC:  roa.  History of Present Illness: Complains of legs pain with activity had seen Dr Sandria Manly with the formal diagnosis of neuropathy saw Dr Onalee Hua with slight increased PSA and the urologist is following discussion of activity and exercize as a way of perserving health Dr Charlann Boxer gave injections in the hips due to arthritis in the hips and back  Hypertension History:      Positive major cardiovascular risk factors include male age 38 years old or older, hyperlipidemia, and hypertension.  Negative major cardiovascular risk factors include non-tobacco-user status.     Problems Prior to Update: 1)  Eye Floaters  (ICD-379.24) 2)  Neck Pain  (ICD-723.1) 3)  Esophageal Reflux  (ICD-530.81) 4)  Ecchymoses, Spontaneous  (ICD-782.7) 5)  Chronic Maxillary Sinusitis  (ICD-473.0) 6)  Back Pain With Radiculopathy  (ICD-729.2) 7)  Hyperlipidemia  (ICD-272.4) 8)  R/O Multiple Myeloma  (ICD-203.00) 9)  Dermatitis, Atopic  (ICD-691.8) 10)  Irritable Bowel Syndrome  (ICD-564.1) 11)  Neuropathy, Idiopathic Peripheral Nec  (ICD-356.8) 12)  B12 Deficiency  (ICD-266.2) 13)  Kidney Disease  (ICD-593.9) 14)  Allergic Rhinitis  (ICD-477.9) 15)  Hypertension  (ICD-401.9)  Medications Prior to Update: 1)  Cyanocobalamin 1000 Mcg/ml Soln (Cyanocobalamin) .... 1.5 Ml Every 3 Weeks 2)  Diphenoxylate-Atropine 2.5-0.025 Mg Tabs (Diphenoxylate-Atropine) .... As Needed 3)  Longs One Daily Multi Vitamin  Tabs (Multiple Vitamin) .... Take 1 Tablet By Mouth Once A Day 4)  Nadolol 20 Mg Tabs (Nadolol) .... 1/2 Every Day 5)  Betamethasone Dipropionate Aug 0.05 %  Oint (Aug Betamethasone  Dipropionate) .... Apply To Rash 6)  Micardis 40 Mg Tabs (Telmisartan) .... One By Mouth Daily At Bed Time 7)  Indapamide 1.25 Mg Tabs (Indapamide) .... 1/ 2 Once Daily  Current Medications (verified): 1)  Cyanocobalamin 1000 Mcg/ml Soln (Cyanocobalamin) .... 1.5 Ml Every 3 Weeks 2)  Diphenoxylate-Atropine 2.5-0.025 Mg Tabs (Diphenoxylate-Atropine) .... As Needed 3)  Longs One Daily Multi Vitamin  Tabs (Multiple Vitamin) .... Take 1 Tablet By Mouth Once A Day 4)  Nadolol 20 Mg Tabs (Nadolol) .... 1/2 Every Day 5)  Betamethasone Dipropionate Aug 0.05 %  Oint (Aug Betamethasone Dipropionate) .... Apply To Rash 6)  Micardis 40 Mg Tabs (Telmisartan) .... One By Mouth Daily At Bed Time 7)  Indapamide 1.25 Mg Tabs (Indapamide) .... 1/ 2 Once Daily  Allergies (verified): 1)  ! Lipitor (Atorvastatin Calcium) 2)  ! * Statins  ( Crestor) 3)  Prednisone Intensol (Prednisone) 4)  Amoxicillin (Amoxicillin) 5)  Cellcept (Mycophenolate Mofetil) 6)  Cipro  Past History:  Family History:    n/c (06/28/2007)  Social History:    Retired    Married    Former Smoker     (05/05/2008)  Risk Factors:    Alcohol Use: N/A    >5 drinks/d w/in last 3 months: N/A    Caffeine Use: N/A    Diet: N/A    Exercise: N/A  Risk Factors:    Smoking Status: quit (05/05/2008)  Packs/Day: N/A    Cigars/wk: N/A    Pipe Use/wk: N/A    Cans of tobacco/wk: N/A    Passive Smoke Exposure: N/A  Past medical, surgical, family and social histories (including risk factors) reviewed, and no changes noted (except as noted below).  Past Medical History:    Reviewed history from 03/13/2008 and no changes required:    Hypertension    B12 deff.    Allergic rhinitis    nephritis    neuropathy    Hyperlipidemia  Past Surgical History:    Reviewed history from 08/11/2007 and no changes required:    Inguinal herniorrhaphy    Lumbar laminectomy    Lumbar fusion    Tonsillectomy  Family History:    Reviewed  history from 06/28/2007 and no changes required:       n/c  Social History:    Reviewed history from 05/05/2008 and no changes required:       Retired       Married              Former Smoker  Review of Systems       The patient complains of muscle weakness and difficulty walking.  The patient denies anorexia, fever, weight loss, weight gain, vision loss, decreased hearing, hoarseness, chest pain, syncope, dyspnea on exertion, peripheral edema, prolonged cough, headaches, hemoptysis, abdominal pain, melena, hematochezia, severe indigestion/heartburn, hematuria, incontinence, genital sores, suspicious skin lesions, transient blindness, depression, unusual weight change, abnormal bleeding, enlarged lymph nodes, angioedema, breast masses, and testicular masses.    Physical Exam  General:  alert, well-hydrated, and pale.   Head:  normocephalic and male-pattern balding.   Eyes:  pupils equal and pupils round.   Nose:  nasal dischargemucosal pallor, mucosal erythema, and mucosal edema.   Mouth:  pharyngeal exudate and posterior lymphoid hypertrophy.   Neck:  No deformities, masses, or  tender and stiff Lungs:  normal respiratory effort and no wheezes.   Heart:  normal rate and regular rhythm.   Abdomen:  soft, normal bowel sounds, and epigastric tenderness.   Msk:  no joint swelling and no joint warmth.   Extremities:  No clubbing, cyanosis, edema, or deformity noted with normal full range of motion of all joints.   Neurologic:  alert & oriented X3 and gait normal.     Impression & Recommendations:  Problem # 1:  HYPERLIPIDEMIA (ICD-272.4) scheduled labs to moniter and refill meds based on results Labs Reviewed: SGOT: 25 (10/26/2007)   SGPT: 15 (10/26/2007)  10 Yr Risk Heart Disease: Not enough information Prior 10 Yr Risk Heart Disease: 27 % (12/11/2008)   HDL:74.0 (02/15/2008)  LDL:DEL (02/15/2008)  Chol:419 (02/15/2008)  Trig:278 (02/15/2008)  Orders: Venipuncture  (16109) TLB-Cholesterol, HDL (83718-HDL) TLB-Cholesterol, Direct LDL (83721-DIRLDL) TLB-Cholesterol, Total (82465-CHO)  Problem # 2:  B12 DEFICIENCY (ICD-266.2) monitering the b12 for the neuropthy Orders: TLB-B12 + Folate Pnl ([id number]-B14/NWG)  Problem # 3:  HYPERTENSION (ICD-401.9) Assessment: Unchanged stable His updated medication list for this problem includes:    Nadolol 20 Mg Tabs (Nadolol) .[name] Kitchen... 1/2 every day    Micardis 40 Mg Tabs (Telmisartan) ..... One by mouth daily at bed time    Indapamide 1.25 Mg Tabs (Indapamide) .[name] Kitchen... 1/ 2 once daily  BP today: 120/82 Prior BP: 130/80 (12/11/2008)  10 Yr Risk Heart Disease: Not enough information Prior 10 Yr Risk Heart Disease: 27 % (12/11/2008)  Labs Reviewed: K+: 4.5 (10/16/2008) Creat: : 0.8 (10/16/2008)   Chol: 419 (02/15/2008)   HDL: 74.0 (  02/15/2008)   LDL: DEL (02/15/2008)   TG: 278 (02/15/2008)  Problem # 4:  NEUROPATHY, IDIOPATHIC PERIPHERAL NEC (ICD-356.8) Assessment: Comment Only stable  Complete Medication List: 1)  Cyanocobalamin 1000 Mcg/ml Soln (Cyanocobalamin) .... 1.5 ml every 3 weeks 2)  Diphenoxylate-atropine 2.5-0.025 Mg Tabs (Diphenoxylate-atropine) .... As needed 3)  Longs One Daily Multi Vitamin Tabs (Multiple vitamin) .... Take 1 tablet by mouth once a day 4)  Nadolol 20 Mg Tabs (Nadolol) .... 1/2 every day 5)  Betamethasone Dipropionate Aug 0.05 % Oint (Aug betamethasone dipropionate) .... Apply to rash 6)  Micardis 40 Mg Tabs (Telmisartan) .... One by mouth daily at bed time 7)  Indapamide 1.25 Mg Tabs (Indapamide) .... 1/ 2 once daily 8)  Cialis 5 Mg Tabs (Tadalafil) .... One by mouth daily as needed  Other Orders: TLB-BMP (Basic Metabolic Panel-BMET) (80048-METABOL)  Hypertension Assessment/Plan:      The patient's hypertensive risk group is category B: At least one risk factor (excluding diabetes) with no target organ damage.  Today's blood pressure is 120/82.  His blood pressure goal is  < 125/75.  Patient Instructions: 1)  Please schedule a follow-up appointment in 3 months. Prescriptions: CIALIS 5 MG TABS (TADALAFIL) one by mouth daily as needed  #30 x 3   Entered and Authorized by:   [name] MD   Signed by:   [name] MD on 03/27/2009   Method used:   Electronically to        [facility] [address] [id number]* (retail)       [address] [place], [place]  [address]       Ph: [phone]       Fax: [phone]   RxID:   [id number]      Immunization History:  Tetanus/Td Immunization History:    Tetanus/Td:  historical (03/18/2007)

## 2010-12-17 NOTE — Assessment & Plan Note (Signed)
Summary: B-12/FLU SHOT/RCD  Nurse Visit    Prior Medications: CYANOCOBALAMIN 1000 MCG/ML SOLN (CYANOCOBALAMIN) Inject 1 mg intramuscularly DIPHENOXYLATE-ATROPINE 2.5-0.025 MG TABS (DIPHENOXYLATE-ATROPINE) as needed \\par  LONGS ONE DAILY MULTI VITAMIN  TABS (MULTIPLE VITAMIN) Take 1 tablet by mouth once a day NADOLOL 20 MG TABS (NADOLOL) 1/2 every day XYZAL 5 MG  TABS (LEVOCETIRIZINE DIHYDROCHLORIDE) once daily HYZAAR 50-12.5 MG  TABS (LOSARTAN POTASSIUM-HCTZ) once daily LYRICA 50 MG  CAPS (PREGABALIN) on po q HS FISH OIL CONCENTRATE 1000 MG  CAPS (OMEGA-3 FATTY ACIDS) 2-4 capsules daily Current Allergies: PREDNISONE INTENSOL (PREDNISONE) AMOXICILLIN (AMOXICILLIN) CELLCEPT (MYCOPHENOLATE MOFETIL) CIPRO (CIPROFLOXACIN)   Influenza Vaccine    Vaccine Type: Fluvax MCR    Given by: Willy Eddy, LPN  Flu Vaccine Consent Questions    Do you have a history of severe allergic reactions to this vaccine? no    Any prior history of allergic reactions to egg and/or gelatin? no    Do you have a sensitivity to the preservative Thimersol? no    Do you have a past history of Guillan-Barre Syndrome? no    Do you currently have an acute febrile illness? no    Have you ever had a severe reaction to latex? no    Vaccine information given and explained to patient? yes   Medication Administration  Injection # 1:    Medication: Vit B12 1000 mcg    Diagnosis: B12 DEFICIENCY (ICD-266.2)    Route: IM    Site: R deltoid    Exp Date: 09/26/2008    Lot #: [id number]    Patient tolerated injection without complications    Given by: Willy Eddy, LPN (August 31, 2007 2:10 PM)  Orders Added: 1)  Influenza Vaccine MCR [00025] 2)  Vit B12 1000 mcg [J3420] 3)  Admin of Therapeutic Inj  intramuscular or subcutaneous [[id number]]     Impression & Recommendations: lot [id number].Exp 05/16/2008 [employer] pasteur-left deltoid IM.0.63ml   Complete Medication List: 1)  Cyanocobalamin 1000 Mcg/ml Soln  (Cyanocobalamin) .... Inject 1 mg intramuscularly 2)  Diphenoxylate-atropine 2.5-0.025 Mg Tabs (Diphenoxylate-atropine) .... As needed \\par  3)  Longs One Daily Multi Vitamin Tabs (Multiple vitamin) .... Take 1 tablet by mouth once a day 4)  Nadolol 20 Mg Tabs (Nadolol) .... 1/2 every day 5)  Xyzal 5 Mg Tabs (Levocetirizine dihydrochloride) .... Once daily 6)  Hyzaar 50-12.5 Mg Tabs (Losartan potassium-hctz) .... Once daily 7)  Lyrica 50 Mg Caps (Pregabalin) .... On po q hs 8)  Fish Oil Concentrate 1000 Mg Caps (Omega-3 fatty acids) .... 2-4 capsules daily  ]   Medication Administration  Injection # 1:    Medication: Vit B12 1000 mcg    Diagnosis: B12 DEFICIENCY (ICD-266.2)    Route: IM    Site: R deltoid    Exp Date: 09/26/2008    Lot #: [id number]    Patient tolerated injection without complications    Given by: Willy Eddy, LPN (August 31, 2007 2:10 PM)  Orders Added: 1)  Influenza Vaccine MCR [00025] 2)  Vit B12 1000 mcg [J3420] 3)  Admin of Therapeutic Inj  intramuscular or subcutaneous [90772]

## 2010-12-17 NOTE — Assessment & Plan Note (Signed)
Summary: B12//LH   Nurse Visit     Allergies: 1)  ! Lipitor (Atorvastatin Calcium) 2)  ! * Statins  ( Crestor) 3)  Prednisone Intensol (Prednisone) 4)  Amoxicillin (Amoxicillin) 5)  Cellcept (Mycophenolate Mofetil) 6)  Cipro     Medication Administration  Injection # 1:    Medication: Vit B12 1000 mcg    Diagnosis: B12 DEFICIENCY (ICD-266.2)    Route: IM    Site: L deltoid    Exp Date: 07/18/2010    Lot #: [id number]    Mfr: American Regent    Patient tolerated injection without complications    Given by: Willy Eddy, LPN (Apr 02, 2009 12:35 PM)  Orders Added: 1)  Admin of Therapeutic Inj  intramuscular or subcutaneous [96372] 2)  Vit B12 1000 mcg [J3420]

## 2010-12-17 NOTE — Assessment & Plan Note (Signed)
Summary: SIDE PAIN/FH    History of Present Illness Visit Type: follow up Primary GI MD: Sheryn Bison MD FACP FAGA Primary Provider: Darryll Capers, MD Requesting Provider: n/a Chief Complaint: Right lower abd pain x 2 months. History of Present Illness:   Timothy Gallagher is a 75 year old white male with chronic irritable bowel syndrome and pernicious anemia with atrophic gastritis. He has had chronic year-old bowel syndrome for many years with crampy low abdominal pain and diarrhea. Also complains of frequent attacks of gas and bloating, and now has had constant right lower quadrant discomfort for 2 months. Pain has no relationship to food or bowel movements no alleviated by Darvocet. Said no rectal bleeding or systemic complaints such as fevers, chills, skin rashes etc. He does have a history of recurrent kidney stones and recently been evaluated apparently by Dr. Darvin Neighbours and allegedly everything checked out okay. He denies any specific genitourinary, hepatobiliary, or upper gastrointestinal problems. He has had previous right anal hernia repair surgery by Dr. Daphine Deutscher but no other abdominal surgery. He denies melena, hematochezia, anorexia weight loss, or other systemic complaints.   GI Review of Systems    Reports abdominal pain, bloating, and  nausea.     Location of  Abdominal pain: RLQ.    Denies acid reflux, belching, chest pain, dysphagia with liquids, dysphagia with solids, heartburn, loss of appetite, vomiting, vomiting blood, weight loss, and  weight gain.      Reports diarrhea.     Denies anal fissure, black tarry stools, change in bowel habit, constipation, diverticulosis, fecal incontinence, heme positive stool, hemorrhoids, irritable bowel syndrome, jaundice, light color stool, liver problems, rectal bleeding, and  rectal pain.    Current Medications (verified): 1)  Cyanocobalamin 1000 Mcg/ml Soln (Cyanocobalamin) .... 1.5 Ml Every 3 Weeks 2)  Diphenoxylate-Atropine 2.5-0.025 Mg Tabs  (Diphenoxylate-Atropine) .... As Needed 3)  Nadolol 20 Mg Tabs (Nadolol) .... 1/2 Every Day 4)  Betamethasone Dipropionate Aug 0.05 %  Oint (Aug Betamethasone Dipropionate) .... Apply To Rash 5)  Micardis 40 Mg Tabs (Telmisartan) .... One By Mouth Daily At Bed Time 6)  Indapamide 1.25 Mg Tabs (Indapamide) .... 1/ 2 Once Daily 7)  Centrum Silver  Tabs (Multiple Vitamins-Minerals) .... One Tablet By Mouth Once Daily 8)  Fish Oil   Oil (Fish Oil) .... One Tablet By Mouth Once Daily 9)  Tylenol Extra Strength 500 Mg Tabs (Acetaminophen) .... As Needed For Pain  Allergies (verified): 1)  ! Lipitor (Atorvastatin Calcium) 2)  ! * Statins  ( Crestor) 3)  Prednisone Intensol (Prednisone) 4)  Amoxicillin (Amoxicillin) 5)  Cellcept (Mycophenolate Mofetil) 6)  Cipro  Past History:  Past medical, surgical, family and social histories (including risk factors) reviewed for relevance to current acute and chronic problems.  Past Medical History: Current Problems:  RLQ PAIN (ICD-789.03) COLITIS (ICD-558.9) ESOPHAGEAL STRICTURE (ICD-530.3) GASTRITIS, CHRONIC (ICD-535.10) COLONIC POLYPS, HX OF (ICD-V12.72) DIVERTICULOSIS, COLON (ICD-562.10) ACTINIC KERATOSIS, HEAD (ICD-702.0) EYE FLOATERS (ICD-379.24) NECK PAIN (ICD-723.1) ESOPHAGEAL REFLUX (ICD-530.81) ECCHYMOSES, SPONTANEOUS (ICD-782.7) CHRONIC MAXILLARY SINUSITIS (ICD-473.0) BACK PAIN WITH RADICULOPATHY (ICD-729.2) HYPERLIPIDEMIA (ICD-272.4) R/O MULTIPLE  MYELOMA (ICD-203.00) DERMATITIS, ATOPIC (ICD-691.8) IRRITABLE BOWEL SYNDROME (ICD-564.1) NEUROPATHY, IDIOPATHIC PERIPHERAL NEC (ICD-356.8) B12 DEFICIENCY (ICD-266.2) KIDNEY DISEASE (ICD-593.9) ALLERGIC RHINITIS (ICD-477.9) HYPERTENSION (ICD-401.9)  Past Surgical History: Reviewed history from 08/11/2007 and no changes required. Inguinal herniorrhaphy Lumbar laminectomy Lumbar fusion Tonsillectomy  Family History: Reviewed history from 06/28/2007 and no changes required. n/c No  FH of Colon Cancer:  Social History: Reviewed history from 05/05/2008 and no  changes required. Retired Married Former Smoker Alcohol Use - no Illicit Drug Use - no Patient does not get regular exercise.  Daily Caffeine Use: occDrug Use:  no Does Patient Exercise:  no  Review of Systems       The patient complains of back pain and fatigue.  The patient denies allergy/sinus, anemia, anxiety-new, arthritis/joint pain, blood in urine, breast changes/lumps, change in vision, confusion, cough, coughing up blood, depression-new, fainting, fever, headaches-new, hearing problems, heart murmur, heart rhythm changes, itching, menstrual pain, muscle pains/cramps, night sweats, nosebleeds, pregnancy symptoms, shortness of breath, skin rash, sleeping problems, sore throat, swelling of feet/legs, swollen lymph glands, thirst - excessive , urination - excessive , urination changes/pain, urine leakage, vision changes, and voice change.         apparently he has had MRI of his back performed by Dr. Margaretha Sheffield and this was normal. He has had previous lumbar fusion surgery and laminectomy.  Vital Signs:  Patient profile:   75 year old male Height:      72 inches Weight:      192.2 pounds BMI:     26.16 BSA:     2.10 Pulse rate:   78 / minute Pulse rhythm:   regular BP sitting:   128 / 76  (right arm) Cuff size:   regular  Vitals Entered By: Ok Anis CMA (May 01, 2009 11:04 AM)  Physical Exam  General:  Well developed, well nourished, no acute distress. Head:  Normocephalic and atraumatic. Eyes:  PERRLA, no icterus.exam deferred to patient's ophthalmologist.   Neck:  Supple; no masses or thyromegaly. Lungs:  Clear throughout to auscultation. Heart:  Regular rate and rhythm; no murmurs, rubs,  or bruits. Abdomen:  Soft, nontender and nondistended. No masses, hepatosplenomegaly or hernias noted. Normal bowel sounds.I cannot appreciate any inguinal hernias or other abdominal wall  abnormalities. Rectal:  Normal exam.hemocult negative.  there are no perirectal fissures we'll fistulae noted. There is soft stool present which is guaiac negative. Extremities:  No clubbing, cyanosis, edema or deformities noted. Neurologic:  Alert and  oriented x4;  grossly normal neurologically. Cervical Nodes:  No significant cervical adenopathy. Inguinal Nodes:  No significant inguinal adenopathy. Psych:  Alert and cooperative. Normal mood and affect.   Impression & Recommendations:  Problem # 1:  RLQ PAIN (ICD-789.03) Assessment New Timothy Gallagher has severe diarrhea predominant irritable bowel syndrome and may possibly have bacterial overgrowth syndrome. He's had recent colonoscopy which has not shown any evidence of IBD. His diarrhea is managed well by taking Lomotil 2-3 times a week. I want. We treated him for bacterial overgrowth with Xifaxan 550 mg twice a day with daily probiotic therapy and check sed rate. Orders: TLB-Sedimentation Rate (ESR) ([id number])  Problem # 2:  COLONIC POLYPS, HX OF (ICD-V12.72) Assessment: Unchanged colonoscopy is due in December of 2011.  Problem # 3:  GASTRITIS, CHRONIC (ICD-535.10) Assessment: Improved He has chronic atrophic gastritis and pernicious anemia and is receiving B12 replacement therapy. He does not need and is not on a PPI at this time.  Problem # 4:  R/O MULTIPLE  MYELOMA (ICD-203.00)  Problem # 5:  IRRITABLE BOWEL SYNDROME (ICD-564.1) Assessment: Improved Continue p.r.n. Lomotil use. Sedimentation rate was ordered today.  Patient Instructions: 1)  Copy sent to : Dr. Darryll Capers 2)  Please continue current medications.  3)  Empiric treatment for bacterial overgrowth syndrome. 4)  Please call our GI Office back in 2 weeks with a follow-up of symptoms. Prescriptions: XIFAXAN 550 MG  take one tablet by mouth two times a day  #60 x 0   Entered by:   Harlow Mares CMA   Authorized by:   Mardella Layman MD [facility]   Signed by:   Mardella Layman MD FACG,FAGA on 05/01/2009   Method used:   Printed then faxed to ...       [facility] [address] [id number]* (retail)       [address] [place], [place]  [address]       Ph: [phone]       Fax: [phone]   RxID:   [id number]   Appended Document: SIDE PAIN/FH    Clinical Lists Changes  Medications: Added new medication of ALIGN   CAPS (MISC INTESTINAL FLORA REGULAT) Take one capsule by mouth daily Changed medication from * XIFAXAN 550 MG take one tablet by mouth two times a day to * XIFAXAN 550 MG take one tablet by mouth two times a day

## 2010-12-17 NOTE — Progress Notes (Signed)
Summary: pt having difficulty urinating  Phone Note Call from Patient Call back at Home Phone [phone]   Caller: Patient Call For: [name] MD Reason for Call: Privacy/Consent Authorization Complaint: Urinary/GYN Problems Summary of Call: Pt called and said that Dr. [name] gave him a z-pack and another med, but now pt is having diffuculty urinating. Please call to discuss.  Initial call taken by: Lucy Antigua,  July 10, 2009 8:10 AM  Follow-up for Phone Call        Patient states when he started taking the Allegra D he could hardly urinate. Patient states he has stopped the Allegra D and its a little better but he is still not urinating at full force. Patient wants to know what to do. Patient denies any burning or stinging with urination and no fever. Follow-up by: Darra Lis RMA,  July 10, 2009 8:24 AM  Additional Follow-up for Phone Call Additional follow up Details #1::        talked with pt and he thinks it is allegra d- pt instructed to stop and drink plenty of liquids but completed z pack Additional Follow-up by: Willy Eddy, LPN,  July 10, 2009 8:31 AM

## 2010-12-17 NOTE — Assessment & Plan Note (Signed)
Summary: B12 W/BONNYE/CCM  Nurse Visit    Prior Medications: CYANOCOBALAMIN 1000 MCG/ML SOLN (CYANOCOBALAMIN) Inject 1 mg intramuscularly DIPHENOXYLATE-ATROPINE 2.5-0.025 MG TABS (DIPHENOXYLATE-ATROPINE) as needed \\par  LONGS ONE DAILY MULTI VITAMIN  TABS (MULTIPLE VITAMIN) Take 1 tablet by mouth once a day NADOLOL 20 MG TABS (NADOLOL) 1/2 every day XYZAL 5 MG  TABS (LEVOCETIRIZINE DIHYDROCHLORIDE) once daily HYZAAR 50-12.5 MG  TABS (LOSARTAN POTASSIUM-HCTZ) once daily LYRICA 50 MG  CAPS (PREGABALIN) on po q HS FISH OIL CONCENTRATE 1000 MG  CAPS (OMEGA-3 FATTY ACIDS) 2-4 capsules daily DESOWEN 0.05 %  CREA (DESONIDE) apply with eucerin cream 50/50 to hands NASACORT AQ 55 MCG/ACT  AERS (TRIAMCINOLONE ACETONIDE(NASAL)) two sprays q nostril daily DOXYCYCLINE HYCLATE 100 MG  CAPS (DOXYCYCLINE HYCLATE) one by mouth BID ALLERX DOSE PACK   MISC (PSE-METHSCOP & CPM-PE-METHSCOP) take two times a day as directed Current Allergies: PREDNISONE INTENSOL (PREDNISONE) AMOXICILLIN (AMOXICILLIN) CELLCEPT (MYCOPHENOLATE MOFETIL) CIPRO (CIPROFLOXACIN)    Medication Administration  Injection # 1:    Medication: Vit B12 1000 mcg    Diagnosis: B12 DEFICIENCY (ICD-266.2)    Route: IM    Site: L deltoid    Exp Date: 07/18/2009    Lot #: [id number]    Mfr: American Regent    Patient tolerated injection without complications    Given by: Willy Eddy, LPN (December 14, 2007 12:01 PM)  Orders Added: 1)  Vit B12 1000 mcg [J3420] 2)  Admin of Therapeutic Inj  intramuscular or subcutaneous Lepidus.Putnam    ]

## 2010-12-17 NOTE — Progress Notes (Signed)
Summary: medication problem  Phone Note Call from Patient   Caller: Patient Call For: Dr [name] Complaint: Headache Summary of Call: Pt calls stating Maxifed, and Biaxin caused him to have difficulty voiding.  No pain.........just frequency and hesitation. He has stopped both.  He took a total of 6 days of Biaxin. [phone] Initial call taken by: Lynann Beaver CMA,  June 05, 2008 10:16 AM         Appended Document: medication problem Pt states he did not hear from [place] yesterday and he is now having frequency of urination at night x 10 times.  He has called the Urology Office and asked for appt, but wonders what Dr. Delorise Royals would appreciate a call in case he cannot get in to see URO.  Appended Document: medication problem per dr [name]- completed biaxin it woudlnt have caused that- pt has appointment with uro tomorrow

## 2010-12-17 NOTE — Progress Notes (Signed)
Summary: BP running high, still having headaches  Phone Note Call from Patient Call back at Home Phone [phone]   Caller: Patient Call For: [name] Summary of Call: Pt has taked Micardis at HS 2 nights now and reports he awakens around 2 am with a headache, not sure if this is from sinus pressure or his BP.  His BP at 7am today was 158/93, he took it again just before calling and it was 123/87.  Yesterday his BP in the AM was 172/137.  Is he to wait a few days for the med to begin working? [facility] on [address] and [place] Initial call taken by: Sid Falcon LPN,  August 31, 2008 8:53 AM  Follow-up for Phone Call        appears to be coing down ,continue to monitor and will take a time for new med to get in systme- use saline ns for headache- to er if other sx Follow-up by: Willy Eddy, LPN,  August 31, 2008 9:27 AM

## 2010-12-17 NOTE — Assessment & Plan Note (Signed)
Summary: b12 inj/ccm   Nurse Visit   Allergies: 1)  ! Lipitor (Atorvastatin Calcium) 2)  ! * Statins  ( Crestor) 3)  Prednisone Intensol (Prednisone) 4)  Amoxicillin (Amoxicillin) 5)  Cellcept (Mycophenolate Mofetil) 6)  Cipro  Medication Administration  Injection # 1:    Medication: Vit B12 1000 mcg    Diagnosis: B12 DEFICIENCY (ICD-266.2)    Route: IM    Site: R deltoid    Exp Date: 06/16/2012    Lot #: [id number]    Mfr: American Regent    Patient tolerated injection without complications    Given by: Willy Eddy, LPN (August 26, 2010 12:16 PM)  Orders Added: 1)  Vit B12 1000 mcg [J3420] 2)  Admin of Therapeutic Inj  intramuscular or subcutaneous [43329]

## 2010-12-17 NOTE — Assessment & Plan Note (Signed)
Summary: CONGESTION/CCM   Vital Signs:  Patient Profile:   75 Years Old Male Height:     72 inches Weight:      198 pounds Temp:     98.3 degrees F oral Pulse rate:   76 / minute Resp:     14 per minute BP sitting:   130 / 70  (left arm)  Vitals Entered By: Willy Eddy, LPN (May 29, 2008 3:35 PM)                 Chief Complaint:  c/o pn drainage and URI symptoms.  History of Present Illness:  URI Symptoms      This is a 75 year old man who presents with URI symptoms.  thick sputum, cough is productive of thick.  The patient denies nasal congestion, clear nasal discharge, purulent nasal discharge, sore throat, dry cough, productive cough, earache, and sick contacts.  Associated symptoms include low-grade fever (<100.5 degrees).  The patient denies itchy watery eyes, itchy throat, sneezing, seasonal symptoms, response to antihistamine, headache, muscle aches, and severe fatigue.  Risk factors for Strep sinusitis include unilateral nasal discharge.      Current Allergies: ! LIPITOR (ATORVASTATIN CALCIUM) ! * STATINS  ( CRESTOR) PREDNISONE INTENSOL (PREDNISONE) AMOXICILLIN (AMOXICILLIN) CELLCEPT (MYCOPHENOLATE MOFETIL) CIPRO  Past Medical History:    Reviewed history from 03/13/2008 and no changes required:       Hypertension       B12 deff.       Allergic rhinitis       nephritis       neuropathy       Hyperlipidemia  Past Surgical History:    Reviewed history from 08/11/2007 and no changes required:       Inguinal herniorrhaphy       Lumbar laminectomy       Lumbar fusion       Tonsillectomy   Family History:    Reviewed history from 06/28/2007 and no changes required:       n/c  Social History:    Reviewed history from 05/05/2008 and no changes required:       Retired       Married              Former Smoker    Review of Systems  The patient denies anorexia, fever, weight loss, weight gain, vision loss, decreased hearing, hoarseness, chest  pain, syncope, dyspnea on exertion, peripheral edema, prolonged cough, headaches, hemoptysis, abdominal pain, melena, hematochezia, severe indigestion/heartburn, hematuria, incontinence, genital sores, muscle weakness, suspicious skin lesions, transient blindness, difficulty walking, depression, unusual weight change, abnormal bleeding, enlarged lymph nodes, angioedema, and breast masses.     Physical Exam  General:     Well-developed,well-nourished,in no acute distress; alert,appropriate and cooperative throughout examination Nose:     External nasal examination shows no deformity or inflammation. Nasal mucosa are pink and moist without lesions or exudates.septum in the midline Mouth:     pharyngeal exudate and posterior lymphoid hypertrophy.   Neck:     No deformities, masses, or tenderness noted. Lungs:     Normal respiratory effort, chest expands symmetrically. Lungs are clear to auscultation, no crackles or wheezes. Heart:     normal rate, no murmur, and extrasystoles noted.   Abdomen:     soft, no distention, no masses, and no guarding.      Impression & Recommendations:  Problem # 1:  HYPERTENSION (ICD-401.9)  His updated medication list for this problem includes:  Nadolol 20 Mg Tabs (Nadolol) .[name] Kitchen... 1/2 every day    Hyzaar 50-12.5 Mg Tabs (Losartan potassium-hctz) ..... Once daily  BP today: 130/70 Prior BP: 122/84 (05/05/2008)  Prior 10 Yr Risk Heart Disease: Not enough information (08/11/2007)  Labs Reviewed: Creat: 0.8 (03/13/2008) Chol: 419 (02/15/2008)   HDL: 74.0 (02/15/2008)   LDL: DEL (02/15/2008)   TG: 278 (02/15/2008)   Problem # 2:  ALLERGIC RHINITIS (ICD-477.9)  His updated medication list for this problem includes:    Flonase 50 Mcg/act Susp (Fluticasone propionate) ..... One squirt r & l nostril bid Discussed use of allergy medications and environmental measures.  Orders: Depo- Medrol 40mg  (J1030)   Problem # 3:  CHRONIC MAXILLARY SINUSITIS  (ICD-473.0)  His updated medication list for this problem includes:    Flonase 50 Mcg/act Susp (Fluticasone propionate) ..... One squirt r & l nostril bid    Biaxin 500 Mg Tab (Clarithromycin) .[name] Kitchen... Take one (1) tablet by mouth two (2) times a day x 10 days    Maxifed Dm 40-20-400 Mg Tabs (Pseudoephedrine-dm-gg) ..... One by mouth q 6 hours Take antibiotics for full duration. Discussed treatment options including indications for coronal CT scan of sinuses and ENT referral.   Problem # 4:  B12 DEFICIENCY (ICD-266.2)  Problem # 5:  HYPERLIPIDEMIA (ICD-272.4)  Labs Reviewed: Chol: 419 (02/15/2008)   HDL: 74.0 (02/15/2008)   LDL: DEL (02/15/2008)   TG: 278 (02/15/2008) SGOT: 25 (10/26/2007)   SGPT: 15 (10/26/2007)  Prior 10 Yr Risk Heart Disease: Not enough information (08/11/2007)   Complete Medication List: 1)  Cyanocobalamin 1000 Mcg/ml Soln (Cyanocobalamin) .... 1.5 ml every 3 weeks 2)  Diphenoxylate-atropine 2.5-0.025 Mg Tabs (Diphenoxylate-atropine) .... As needed \\par  3)  Longs One Daily Multi Vitamin Tabs (Multiple vitamin) .... Take 1 tablet by mouth once a day 4)  Nadolol 20 Mg Tabs (Nadolol) .... 1/2 every day 5)  Hyzaar 50-12.5 Mg Tabs (Losartan potassium-hctz) .... Once daily 6)  Betamethasone Dipropionate Aug 0.05 % Oint (Aug betamethasone dipropionate) .... Apply to rash 7)  Flonase 50 Mcg/act Susp (Fluticasone propionate) .... One squirt r & l nostril bid 8)  Biaxin 500 Mg Tab (Clarithromycin) .... Take one (1) tablet by mouth two (2) times a day x 10 days 9)  Maxifed Dm 40-20-400 Mg Tabs (Pseudoephedrine-dm-gg) .... One by mouth q 6 hours   Patient Instructions: 1)  Take your antibiotic as prescribed until ALL of it is gone, but stop if you develop a rash or swelling and contact our office as soon as possible.   Prescriptions: BIAXIN 500 MG TAB (CLARITHROMYCIN) Take one (1) tablet by mouth two (2) times a day X 10 days  #20 x 0   Entered and Authorized by:   [name] MD   Signed by:   [name] MD on 05/29/2008   Method used:   Electronically sent to ...       [facility] [address] [id number]*       [address]       [place], [place]  [address]       Ph: [phone]       Fax: [phone]   RxID:   [id number]  ]  Appended Document: CONGESTION/CCM depo medrol 178micrograms/1.5 ml im in rt [id number] b exp-7/10

## 2010-12-17 NOTE — Assessment & Plan Note (Signed)
Summary: ROA/FUP/RCD/PT RESCD FROM BUMP//CCM   Vital Signs:  Patient profile:   75 year old male Height:      72 inches Weight:      193 pounds BMI:     26.27 Temp:     98.2 degrees F oral Pulse rate:   72 / minute Resp:     14 per minute BP sitting:   136 / 80  (left arm)  Vitals Entered By: Willy Eddy, LPN (September 24, 2009 11:37 AM)  Nutrition Counseling: Patient's BMI is greater than 25 and therefore counseled on weight management options. CC: roa-   Primary Care Provider:  Darryll Capers, MD  CC:  roa-.  History of Present Illness: follow up of the chronic sinusitis, the hypertension and the medicatin change to losartin  Hypertension Follow-Up      This is a 75 year old man who presents for Hypertension follow-up.  The patient denies lightheadedness, urinary frequency, headaches, edema, impotence, rash, and fatigue.  The patient denies the following associated symptoms: chest pain, chest pressure, exercise intolerance, dyspnea, palpitations, syncope, leg edema, and pedal edema.  Compliance with medications (by patient report) has been near 100%.  The patient reports that dietary compliance has been excellent.  The patient reports exercising occasionally.    Preventive Screening-Counseling & Management  Alcohol-Tobacco     Smoking Status: quit     Packs/Day: 0.75     Year Started: 1964     Year Quit: 2003  Problems Prior to Update: 1)  Acne Rosacea  (ICD-695.3) 2)  Chest Wall Pain, Acute  (ICD-786.52) 3)  Abdominal Pain Other Specified Site  (ICD-789.09) 4)  Elevated Sedimentation Rate  (ICD-790.1) 5)  Rlq Pain  (ICD-789.03) 6)  Colitis  (ICD-558.9) 7)  Esophageal Stricture  (ICD-530.3) 8)  Gastritis, Chronic  (ICD-535.10) 9)  Colonic Polyps, Hx of  (ICD-V12.72) 10)  Diverticulosis, Colon  (ICD-562.10) 11)  Actinic Keratosis, Head  (ICD-702.0) 12)  Eye Floaters  (ICD-379.24) 13)  Neck Pain  (ICD-723.1) 14)  Ecchymoses, Spontaneous  (ICD-782.7) 15)   Chronic Maxillary Sinusitis  (ICD-473.0) 16)  Back Pain With Radiculopathy  (ICD-729.2) 17)  Hyperlipidemia  (ICD-272.4) 18)  Dermatitis, Atopic  (ICD-691.8) 19)  Irritable Bowel Syndrome  (ICD-564.1) 20)  Neuropathy, Idiopathic Peripheral Nec  (ICD-356.8) 21)  B12 Deficiency  (ICD-266.2) 22)  Kidney Disease  (ICD-593.9) 23)  Allergic Rhinitis  (ICD-477.9) 24)  Hypertension  (ICD-401.9)  Medications Prior to Update: 1)  Cyanocobalamin 1000 Mcg/ml Soln (Cyanocobalamin) .... 1.5 Ml Every 3 Weeks 2)  Diphenoxylate-Atropine 2.5-0.025 Mg Tabs (Diphenoxylate-Atropine) .... As Needed 3)  Nadolol 20 Mg Tabs (Nadolol) .... 1/2 Every Day 4)  Losartan Potassium 50 Mg Tabs (Losartan Potassium) .... One By Mouth Daily 5)  Indapamide 1.25 Mg Tabs (Indapamide) .... 1/ 2 Once Daily 6)  Centrum Silver  Tabs (Multiple Vitamins-Minerals) .... One Tablet By Mouth Once Daily 7)  Fish Oil   Oil (Fish Oil) .... One Tablet By Mouth Once Daily 8)  Tylenol Extra Strength 500 Mg Tabs (Acetaminophen) .... As Needed For Pain 9)  Xifaxan 550 Mg .... Take One Tablet By Mouth Two Times A Day 10)  Align   Caps (Misc Intestinal Flora Regulat) .... Take One Capsule By Mouth Daily 11)  Propoxyphene N-Apap 100-325 Mg Tabs (Propoxyphene N-Apap) .... One By Mouth Q 4-6 Hours Prn 12)  Clindamycin Phos-Benzoyl Perox 1-5 % Gel (Clindamycin Phos-Benzoyl Perox) .... Apply To Face Daily After Washing 13)  Astepro 0.15 %  Soln (Azelastine Hcl) .... Two Spray in Each Nostril Daily 14)  Atrovent 0.03 % Soln (Ipratropium Bromide) .... Nasal Spray- 1 Spray in Each Nostril Two Times A Day 15)  Allegra-D 12 Hour 60-120 Mg Xr12h-Tab (Fexofenadine-Pseudoephedrine) .... One Tablet Two Time A Day X 10 Days. 16)  Veramyst 27.5 Mcg/spray Susp (Fluticasone Furoate) .... Two Spray in Each  Nostril Daily  Current Medications (verified): 1)  Cyanocobalamin 1000 Mcg/ml Soln (Cyanocobalamin) .... 1.5 Ml Every 3 Weeks 2)  Diphenoxylate-Atropine  2.5-0.025 Mg Tabs (Diphenoxylate-Atropine) .... As Needed 3)  Nadolol 20 Mg Tabs (Nadolol) .... 1/2 Every Day 4)  Losartan Potassium 50 Mg Tabs (Losartan Potassium) .... One By Mouth Daily 5)  Indapamide 1.25 Mg Tabs (Indapamide) .... 1/ 2 Once Daily 6)  Centrum Silver  Tabs (Multiple Vitamins-Minerals) .... One Tablet By Mouth Once Daily 7)  Fish Oil   Oil (Fish Oil) .... One Tablet By Mouth Once Daily 8)  Tylenol Extra Strength 500 Mg Tabs (Acetaminophen) .... As Needed For Pain 9)  Xifaxan 550 Mg .... Take One Tablet By Mouth Two Times A Day 10)  Align   Caps (Misc Intestinal Flora Regulat) .... Take One Capsule By Mouth Daily 11)  Propoxyphene N-Apap 100-325 Mg Tabs (Propoxyphene N-Apap) .... One By Mouth Q 4-6 Hours Prn 12)  Clindamycin Phos-Benzoyl Perox 1-5 % Gel (Clindamycin Phos-Benzoyl Perox) .... Apply To Face Daily After Washing 13)  Astepro 0.15 % Soln (Azelastine Hcl) .... Two Spray in Each Nostril Daily 14)  Atrovent 0.03 % Soln (Ipratropium Bromide) .... Nasal Spray- 1 Spray in Each Nostril Two Times A Day 15)  Allegra-D 12 Hour 60-120 Mg Xr12h-Tab (Fexofenadine-Pseudoephedrine) .... One Tablet Two Time A Day X 10 Days. 16)  Veramyst 27.5 Mcg/spray Susp (Fluticasone Furoate) .... Two Spray in Each  Nostril Daily  Allergies (verified): 1)  ! Lipitor (Atorvastatin Calcium) 2)  ! * Statins  ( Crestor) 3)  Prednisone Intensol (Prednisone) 4)  Amoxicillin (Amoxicillin) 5)  Cellcept (Mycophenolate Mofetil) 6)  Cipro  Past History:  Family History: Last updated: 05/01/2009 n/c No FH of Colon Cancer:  Social History: Last updated: 05/01/2009 Retired Married Former Smoker Alcohol Use - no Illicit Drug Use - no Patient does not get regular exercise.  Daily Caffeine Use: occ  Risk Factors: Exercise: no (05/01/2009)  Risk Factors: Smoking Status: quit (09/24/2009) Packs/Day: 0.75 (09/24/2009)  Past medical, surgical, family and social histories (including risk  factors) reviewed, and no changes noted (except as noted below).  Past Medical History: Reviewed history from 05/01/2009 and no changes required. Current Problems:  RLQ PAIN (ICD-789.03) COLITIS (ICD-558.9) ESOPHAGEAL STRICTURE (ICD-530.3) GASTRITIS, CHRONIC (ICD-535.10) COLONIC POLYPS, HX OF (ICD-V12.72) DIVERTICULOSIS, COLON (ICD-562.10) ACTINIC KERATOSIS, HEAD (ICD-702.0) EYE FLOATERS (ICD-379.24) NECK PAIN (ICD-723.1) ESOPHAGEAL REFLUX (ICD-530.81) ECCHYMOSES, SPONTANEOUS (ICD-782.7) CHRONIC MAXILLARY SINUSITIS (ICD-473.0) BACK PAIN WITH RADICULOPATHY (ICD-729.2) HYPERLIPIDEMIA (ICD-272.4) R/O MULTIPLE  MYELOMA (ICD-203.00) DERMATITIS, ATOPIC (ICD-691.8) IRRITABLE BOWEL SYNDROME (ICD-564.1) NEUROPATHY, IDIOPATHIC PERIPHERAL NEC (ICD-356.8) B12 DEFICIENCY (ICD-266.2) KIDNEY DISEASE (ICD-593.9) ALLERGIC RHINITIS (ICD-477.9) HYPERTENSION (ICD-401.9)  Past Surgical History: Reviewed history from 08/11/2007 and no changes required. Inguinal herniorrhaphy Lumbar laminectomy Lumbar fusion Tonsillectomy  Family History: Reviewed history from 05/01/2009 and no changes required. n/c No FH of Colon Cancer:  Social History: Reviewed history from 05/01/2009 and no changes required. Retired Married Former Smoker Alcohol Use - no Illicit Drug Use - no Patient does not get regular exercise.  Daily Caffeine Use: occPacks/Day:  0.75  Review of Systems  The patient denies anorexia, fever,  weight loss, weight gain, vision loss, decreased hearing, hoarseness, chest pain, syncope, dyspnea on exertion, peripheral edema, prolonged cough, headaches, hemoptysis, abdominal pain, melena, hematochezia, severe indigestion/heartburn, hematuria, incontinence, genital sores, muscle weakness, suspicious skin lesions, transient blindness, difficulty walking, depression, unusual weight change, abnormal bleeding, enlarged lymph nodes, angioedema, and breast masses.    Physical Exam  General:   Well-developed,well-nourished,in no acute distress; alert,appropriate and cooperative throughout examination Head:  Normocephalic and atraumatic. Ears:  R ear normal and L ear normal.   Nose:  nasal dischargemucosal pallor, mucosal erythema, and mucosal edema.   Mouth:  pharyngeal exudate and posterior lymphoid hypertrophy.   Neck:  No deformities, masses, or tenderness noted. Lungs:  Normal respiratory effort, chest expands symmetrically. Lungs are clear to auscultation, no crackles or wheezes. Heart:  Normal rate and regular rhythm. S1 and S2 normal without gallop, murmur, click, rub or other extra sounds. Abdomen:  Soft, nontender and nondistended. No masses, hepatosplenomegaly or hernias noted. Normal bowel sounds.I cannot appreciate any inguinal hernias or other abdominal wall abnormalities. Msk:  no joint swelling and no joint warmth.   Pulses:  decreased in the lower extremity plus one Extremities:  No clubbing, cyanosis, edema or deformities noted. Neurologic:  Alert and  oriented x4;  grossly normal neurologically.   Impression & Recommendations:  Problem # 1:  HYPERTENSION (ICD-401.9) [facility] His updated medication list for this problem includes:    Nadolol 20 Mg Tabs (Nadolol) .[name] Kitchen... 1/2 every day    Losartan Potassium 50 Mg Tabs (Losartan potassium) ..... One by mouth daily    Indapamide 1.25 Mg Tabs (Indapamide) .[name] Kitchen... 1/ 2 once daily  BP today: 136/80 Prior BP: 136/80 (08/20/2009)  Prior 10 Yr Risk Heart Disease: Not enough information (03/27/2009)  Labs Reviewed: K+: 4.6 (03/27/2009) Creat: : 0.9 (05/03/2009)   Chol: 256 (03/27/2009)   HDL: 33.90 (03/27/2009)   LDL: DEL (02/15/2008)   TG: 278 (02/15/2008)  Problem # 2:  CHRONIC MAXILLARY SINUSITIS (ICD-473.0)  His updated medication list for this problem includes:    Astepro 0.15 % Soln (Azelastine hcl) .[name] Kitchen..[name] Kitchen Two spray in each nostril daily    Atrovent 0.03 % Soln (Ipratropium bromide) ..... Nasal  spray- 1 spray in each nostril two times a day    Allegra-d 12 Hour 60-120 Mg Xr12h-tab (Fexofenadine-pseudoephedrine) ..... One tablet two time a day x 10 days.    Veramyst 27.5 Mcg/spray Susp (Fluticasone furoate) .[name] Kitchen..[name] Kitchen Two spray in each  nostril daily  Take antibiotics for full duration. Discussed treatment options including indications for coronal CT scan of sinuses and ENT referral.   Problem # 3:  HYPERLIPIDEMIA (ICD-272.4) Assessment: Unchanged  Labs Reviewed: SGOT: 25 (10/26/2007)   SGPT: 15 (10/26/2007)  Prior 10 Yr Risk Heart Disease: Not enough information (03/27/2009)   HDL:33.90 (03/27/2009), 74.0 (02/15/2008)  LDL:DEL (02/15/2008)  Chol:256 (03/27/2009), 419 (02/15/2008)  Trig:278 (02/15/2008)  Problem # 4:  IRRITABLE BOWEL SYNDROME (ICD-564.1) continue the align  Complete Medication List: 1)  Cyanocobalamin 1000 Mcg/ml Soln (Cyanocobalamin) .... 1.5 ml every 3 weeks 2)  Diphenoxylate-atropine 2.5-0.025 Mg Tabs (Diphenoxylate-atropine) .... As needed 3)  Nadolol 20 Mg Tabs (Nadolol) .... 1/2 every day 4)  Losartan Potassium 50 Mg Tabs (Losartan potassium) .... One by mouth daily 5)  Indapamide 1.25 Mg Tabs (Indapamide) .... 1/ 2 once daily 6)  Centrum Silver Tabs (Multiple vitamins-minerals) .... One tablet by mouth once daily 7)  Fish Oil Oil (Fish oil) .... One tablet by mouth once daily  8)  Tylenol Extra Strength 500 Mg Tabs (Acetaminophen) .... As needed for pain 9)  Xifaxan 550 Mg  .... Take one tablet by mouth two times a day 10)  Align Caps (Misc intestinal flora regulat) .... Take one capsule by mouth daily 11)  Propoxyphene N-apap 100-325 Mg Tabs (Propoxyphene n-apap) .... One by mouth q 4-6 hours prn 12)  Clindamycin Phos-benzoyl Perox 1-5 % Gel (Clindamycin phos-benzoyl perox) .... Apply to face daily after washing 13)  Astepro 0.15 % Soln (Azelastine hcl) .... Two spray in each nostril daily 14)  Atrovent 0.03 % Soln (Ipratropium bromide) .... Nasal spray- 1  spray in each nostril two times a day 15)  Allegra-d 12 Hour 60-120 Mg Xr12h-tab (Fexofenadine-pseudoephedrine) .... One tablet two time a day x 10 days. 16)  Veramyst 27.5 Mcg/spray Susp (Fluticasone furoate) .... Two spray in each  nostril daily  Other Orders: Vistaril 25mg  (J3410) Admin of Therapeutic Inj  intramuscular or subcutaneous (16109)  Patient Instructions: 1)  Please schedule a follow-up appointment in 3 months. Prescriptions: ASTEPRO 0.15 % SOLN (AZELASTINE HCL) TWO SPRAY IN EACH NOSTRIL DAILY  #3 vials x 3   Entered and Authorized by:   [name] MD   Signed by:   [name] MD on 09/24/2009   Method used:   Faxed to ...       [facility] (mail-order)             , [place]         Ph:        Fax: [phone]   RxID:   [id number] LOSARTAN POTASSIUM 50 MG TABS (LOSARTAN POTASSIUM) one by mouth daily  #90 x 3   Entered and Authorized by:   [name] MD   Signed by:   [name] MD on 09/24/2009   Method used:   Faxed to ...       [facility] [employer])             , [place]         Ph:        Fax: [phone]   RxID:   [id number]    Medication Administration  Injection # 1:    Medication: Vit B12 1000 mcg    Diagnosis: B12 DEFICIENCY (ICD-266.2)    Route: IM    Site: L deltoid    Exp Date: 07/18/2011    Lot #: [id number]    Mfr: [employer]    Comments: 1.12ml /1553mcg given    Patient tolerated injection without complications    Given by: Willy Eddy, LPN (September 24, 2009 11:39 AM)  Orders Added: 1)  Vistaril 25mg  [J3410] 2)  Admin of Therapeutic Inj  intramuscular or subcutaneous [96372] 3)  Est. Patient Level IV [34742]

## 2010-12-17 NOTE — Letter (Signed)
Summary: [facility]  [facility]   Imported By: Maryln Gottron 09/08/2008 15:24:48  _____________________________________________________________________  External Attachment:    Type:   Image     Comment:   External Document

## 2010-12-17 NOTE — Assessment & Plan Note (Signed)
Summary: sinus problem,headache/jls   Vital Signs:  Patient Profile:   75 Years Old Male Height:     72 inches Weight:      204 pounds Temp:     98.7 degrees F oral Pulse rate:   76 / minute Resp:     14 per minute BP sitting:   150 / 80  (left arm)  Vitals Entered By: Willy Eddy, LPN (August 29, 2008 1:49 PM)                 Chief Complaint:  c/o sinus headache and postnasal drainage and cough.  History of Present Illness: increased HA with change in whether with elevated BP sinus congestion PNDrip sneezing   Hypertension History:      He denies headache, chest pain, palpitations, dyspnea with exertion, orthopnea, PND, peripheral edema, visual symptoms, neurologic problems, syncope, and side effects from treatment.  Further comments include: stoped the hyzaar and changes to atacand  increase to 8 rash has improved.        Positive major cardiovascular risk factors include male age 5 years old or older, hyperlipidemia, and hypertension.  Negative major cardiovascular risk factors include non-tobacco-user status.       Current Allergies: ! LIPITOR (ATORVASTATIN CALCIUM) ! * STATINS  ( CRESTOR) PREDNISONE INTENSOL (PREDNISONE) AMOXICILLIN (AMOXICILLIN) CELLCEPT (MYCOPHENOLATE MOFETIL) CIPRO  Past Medical History:    Reviewed history from 03/13/2008 and no changes required:       Hypertension       B12 deff.       Allergic rhinitis       nephritis       neuropathy       Hyperlipidemia  Past Surgical History:    Reviewed history from 08/11/2007 and no changes required:       Inguinal herniorrhaphy       Lumbar laminectomy       Lumbar fusion       Tonsillectomy   Family History:    Reviewed history from 06/28/2007 and no changes required:       n/c  Social History:    Reviewed history from 05/05/2008 and no changes required:       Retired       Married              Former Smoker    Review of Systems       The patient complains of  anorexia, hoarseness, dyspnea on exertion, prolonged cough, and headaches.  The patient denies chest pain, abdominal pain, and severe indigestion/heartburn.     Physical Exam  General:     Well-developed,well-nourished,in no acute distress; alert,appropriate and cooperative throughout examination Head:     Normocephalic and atraumatic without obvious abnormalities. No apparent alopecia or balding. Eyes:     No corneal or conjunctival inflammation noted. EOMI. Perrla. Funduscopic exam benign, without hemorrhages, exudates or papilledema. Vision grossly normal. Nose:     nasal dischargemucosal pallor, mucosal erythema, and mucosal edema.   Mouth:     pharyngeal exudate and posterior lymphoid hypertrophy.   Neck:     No deformities, masses, or tenderness noted. Lungs:     Normal respiratory effort, chest expands symmetrically. Lungs are clear to auscultation, no crackles or wheezes. Heart:     normal rate, no murmur, and extrasystoles noted.      Impression & Recommendations:  Problem # 1:  HYPERTENSION (ICD-401.9)  His updated medication list for this problem includes:    Nadolol 20  Mg Tabs (Nadolol) .[name] Kitchen... 1/2 every day    Micardis 40 Mg Tabs (Telmisartan) ..... One by mouth daily at bed time  BP today: 150/80 Prior BP: 138/80 (08/15/2008)  Prior 10 Yr Risk Heart Disease: Not enough information (08/11/2007)  Labs Reviewed: Creat: 0.8 (03/13/2008) Chol: 419 (02/15/2008)   HDL: 74.0 (02/15/2008)   LDL: DEL (02/15/2008)   TG: 278 (02/15/2008)   Problem # 2:  CHRONIC MAXILLARY SINUSITIS (ICD-473.0)  His updated medication list for this problem includes:    Omnaris 50 Mcg/act Susp (Ciclesonide) .[name] Kitchen..[name] Kitchen Two spray in each nostril daily Take antibiotics for full duration. Discussed treatment options including indications for coronal CT scan of sinuses and ENT referral.   Problem # 3:  IRRITABLE BOWEL SYNDROME (ICD-564.1) Discussed use of allergy medications and environmental  measures.   Complete Medication List: 1)  Cyanocobalamin 1000 Mcg/ml Soln (Cyanocobalamin) .... 1.5 ml every 3 weeks 2)  Diphenoxylate-atropine 2.5-0.025 Mg Tabs (Diphenoxylate-atropine) .... As needed 3)  Longs One Daily Multi Vitamin Tabs (Multiple vitamin) .... Take 1 tablet by mouth once a day 4)  Nadolol 20 Mg Tabs (Nadolol) .... 1/2 every day 5)  Betamethasone Dipropionate Aug 0.05 % Oint (Aug betamethasone dipropionate) .... Apply to rash 6)  Omnaris 50 Mcg/act Susp (Ciclesonide) .... Two spray in each nostril daily 7)  [facility] Loratadine 10 Mg Tabs (Loratadine) .[name] Kitchen.. 1 once daily 8)  Cimetidine 300 Mg Tabs (Cimetidine) .... One by mouth bid 9)  Micardis 40 Mg Tabs (Telmisartan) .... One by mouth daily at bed time 10)  Xyzal 5 Mg Tabs (Levocetirizine dihydrochloride) .... One by mouth daily  Hypertension Assessment/Plan:      The patient's hypertensive risk group is category B: At least one risk factor (excluding diabetes) with no target organ damage.  Today's blood pressure is 150/80.  His blood pressure goal is < 125/75.   Patient Instructions: 1)  keep appoinment  for next week   Prescriptions: MICARDIS 40 MG TABS (TELMISARTAN) one by mouth daily at bed time  #30 x 11   Entered and Authorized by:   [name] MD   Signed by:   [name] MD on 08/29/2008   Method used:   Electronically to        [facility] [address] [id number]* (retail)       [address] [place], [place]  [address]       Ph: [phone]       Fax: [phone]   RxID:   [id number]  ]

## 2010-12-17 NOTE — Assessment & Plan Note (Signed)
Summary: 6 wk f/up//db   Vital Signs:  Patient Profile:   75 Years Old Male Height:     72 inches Weight:      198 pounds Temp:     98.2 degrees F oral Pulse rate:   76 / minute Resp:     14 per minute BP sitting:   126 / 80  (left arm)  Vitals Entered By: Willy Eddy, LPN (April 24, 1609 11:01 AM)                 Chief Complaint:  roa- c/o rash since starting slow mag-- so stopped.  History of Present Illness:  Follow-Up Visit      This is a 75 year old man who presents for Follow-up visit.  The patient complains of dizziness, but denies chest pain, palpitations, syncope, low blood sugar symptoms, high blood sugar symptoms, edema, SOB, DOE, PND, and orthopnea.  Since the last visit the patient notes being seen by a specialist.  The patient reports monitoring BP.  When questioned about possible medication side effects, the patient notes cramping, fatigue, depressive symptoms, and muscle aches.      Current Allergies: ! LIPITOR (ATORVASTATIN CALCIUM) ! * STATINS  ( CRESTOR) PREDNISONE INTENSOL (PREDNISONE) AMOXICILLIN (AMOXICILLIN) CELLCEPT (MYCOPHENOLATE MOFETIL) CIPRO  Past Medical History:    Reviewed history from 03/13/2008 and no changes required:       Hypertension       B12 deff.       Allergic rhinitis       nephritis       neuropathy       Hyperlipidemia  Past Surgical History:    Reviewed history from 08/11/2007 and no changes required:       Inguinal herniorrhaphy       Lumbar laminectomy       Lumbar fusion       Tonsillectomy   Family History:    Reviewed history from 06/28/2007 and no changes required:       n/c  Social History:    Reviewed history from 06/28/2007 and no changes required:       Retired       Married       Never Smoked    Review of Systems  The patient denies anorexia, fever, weight loss, weight gain, vision loss, decreased hearing, hoarseness, chest pain, syncope, dyspnea on exertion, peripheral edema, prolonged  cough, headaches, hemoptysis, abdominal pain, melena, hematochezia, severe indigestion/heartburn, hematuria, incontinence, genital sores, muscle weakness, suspicious skin lesions, transient blindness, difficulty walking, depression, unusual weight change, abnormal bleeding, enlarged lymph nodes, angioedema, and breast masses.     Physical Exam  General:     Well-developed,well-nourished,in no acute distress; alert,appropriate and cooperative throughout examination Head:     male-pattern balding.   Eyes:     pupils equal and pupils round.   Ears:     External ear exam shows no significant lesions or deformities.  Otoscopic examination reveals clear canals, tympanic membranes are intact bilaterally without bulging, retraction, inflammation or discharge. Hearing is grossly normal bilaterally. Nose:     no external deformity and mucosal edema.   Mouth:     Oral mucosa and oropharynx without lesions or exudates.  Teeth in good repair. Neck:     No deformities, masses, or tenderness noted. Abdomen:     soft, no distention, no masses, and no guarding.   Msk:     no joint warmth.   Skin:  solar rash Cervical Nodes:     No lymphadenopathy noted Axillary Nodes:     No palpable lymphadenopathy    Impression & Recommendations:  Problem # 1:  DERMATITIS, ATOPIC (ICD-691.8) SUN SCREEN NEEDED FOR RECURRENT solar rash The following medications were removed from the medication list:    Desowen 0.05 % Crea (Desonide) .[name] Kitchen... Apply with eucerin cream 50/50 to hands  His updated medication list for this problem includes:    Betamethasone Dipropionate Aug 0.05 % Oint (Aug betamethasone dipropionate) .[name] Kitchen... Apply to rash Discussed use of medication and avoidance of irritating agents. Also stressed importance of moisturizers.   Problem # 2:  HYPERTENSION (ICD-401.9) change the hyzaar to night His updated medication list for this problem includes:    Nadolol 20 Mg Tabs (Nadolol) .[name] Kitchen... 1/2 every  day    Hyzaar 50-12.5 Mg Tabs (Losartan potassium-hctz) ..... Once daily  BP today: 126/80 Prior BP: 120/70 (03/28/2008)  Prior 10 Yr Risk Heart Disease: Not enough information (08/11/2007)  Labs Reviewed: Creat: 0.8 (03/13/2008) Chol: 419 (02/15/2008)   HDL: 74.0 (02/15/2008)   LDL: DEL (02/15/2008)   TG: 278 (02/15/2008)   Problem # 3:  BACK PAIN WITH RADICULOPATHY (ICD-729.2) discusson of Dr Wells Guiles findings  Problem # 4:  KIDNEY DISEASE (ICD-593.9) see Dr Vira Agar note  Complete Medication List: 1)  Cyanocobalamin 1000 Mcg/ml Soln (Cyanocobalamin) .... 1.5 ml every 3 weeks 2)  Diphenoxylate-atropine 2.5-0.025 Mg Tabs (Diphenoxylate-atropine) .... As needed \\par  3)  Longs One Daily Multi Vitamin Tabs (Multiple vitamin) .... Take 1 tablet by mouth once a day 4)  Nadolol 20 Mg Tabs (Nadolol) .... 1/2 every day 5)  Hyzaar 50-12.5 Mg Tabs (Losartan potassium-hctz) .... Once daily 6)  Prednisone 10 Mg Tabs (Prednisone) .[name] Kitchen.. 1 once daily 7)  Gabapentin 100 Mg Caps (Gabapentin) .... 2 per day hold 8)  Betamethasone Dipropionate Aug 0.05 % Oint (Aug betamethasone dipropionate) .... Apply to rash   Patient Instructions: 1)  Solar rash 2)  use SPF 15 in lotion on arms  and any sun exposed areas where long sleeves when possible 3)  Please schedule a follow-up appointment in 2 months.     ]

## 2010-12-17 NOTE — Letter (Signed)
Summary: [facility]   Imported By: Maryln Gottron 05/24/2010 12:48:26  _____________________________________________________________________  External Attachment:    Type:   Image     Comment:   External Document

## 2010-12-17 NOTE — Progress Notes (Signed)
Summary: vomiting and diarrhea  Phone Note Call from Patient   Caller: Patient Call For: [name] MD Summary of Call: Pt had vomiting and diarrhea last night.  Is taking Lomotil, but does not have anything to take for nausea and vomiting. [facility]) [phone] Initial call taken by: Lynann Beaver CMA,  January 18, 2010 9:37 AM  Follow-up for Phone Call        phenergan 25 mg by mouth or suppository q6 hours number 10 Follow-up by: [name] MD,  January 18, 2010 3:58 PM  Additional Follow-up for Phone Call Additional follow up Details #1::        Phone Call Completed Additional Follow-up by: Rudy Jew, RN,  January 18, 2010 4:08 PM    New/Updated Medications: PROMETHAZINE HCL 25 MG TABS (PROMETHAZINE HCL) One by mouth every 6 hrs nausea PROMETHAZINE HCL 25 MG TABS (PROMETHAZINE HCL) One every 6 hours nausea Prescriptions: PROMETHAZINE HCL 25 MG TABS (PROMETHAZINE HCL) One every 6 hours nausea  #10 x 0   Entered by:   Rudy Jew, RN   Authorized by:   [name] MD   Signed by:   Rudy Jew, RN on 01/18/2010   Method used:   Electronically to        [facility] [address] #[id number]* (retail)       [address] [place], [place]  [address]       Ph: [phone]       Fax: [phone]   RxID:   [id number] PROMETHAZINE HCL 25 MG TABS (PROMETHAZINE HCL) One by mouth every 6 hrs nausea  #10 x 0   Entered by:   Rudy Jew, RN   Authorized by:   [name] MD   Signed by:   Rudy Jew, RN on 01/18/2010   Method used:   Electronically to        [facility] [address] #[id number]* (retail)       [address] [place], [place]  [address]       Ph: [phone]       Fax: [phone]   RxID:   [id number]

## 2010-12-17 NOTE — Assessment & Plan Note (Signed)
Summary: 2 month rov/njr   Vital Signs:  Patient Profile:   75 Years Old Male Height:     72 inches Weight:      196 pounds Temp:     98.4 degrees F oral Pulse rate:   76 / minute Resp:     14 per minute BP sitting:   130 / 80  (left arm)  Vitals Entered By: Willy Eddy, LPN (December 11, 2008 10:32 AM)                 Chief Complaint:  roa.  History of Present Illness: Dr Ivar Bury     Hypertension History:      He denies headache, chest pain, palpitations, dyspnea with exertion, orthopnea, PND, peripheral edema, visual symptoms, neurologic problems, syncope, and side effects from treatment.        Positive major cardiovascular risk factors include male age 44 years old or older, hyperlipidemia, and hypertension.  Negative major cardiovascular risk factors include non-tobacco-user status.       Prior Medication List:  CYANOCOBALAMIN 1000 MCG/ML SOLN (CYANOCOBALAMIN) 1.5 ml every 3 weeks DIPHENOXYLATE-ATROPINE 2.5-0.025 MG TABS (DIPHENOXYLATE-ATROPINE) as needed LONGS ONE DAILY MULTI VITAMIN  TABS (MULTIPLE VITAMIN) Take 1 tablet by mouth once a day NADOLOL 20 MG TABS (NADOLOL) 1/2 every day BETAMETHASONE DIPROPIONATE AUG 0.05 %  OINT (AUG BETAMETHASONE DIPROPIONATE) apply to rash OMNARIS 50 MCG/ACT SUSP (CICLESONIDE) two spray in each nostril daily [facility] LORATADINE 10 MG  TABS (LORATADINE) 1 once daily MICARDIS 40 MG TABS (TELMISARTAN) one by mouth daily at bed time XYZAL 5 MG TABS (LEVOCETIRIZINE DIHYDROCHLORIDE) one by mouth daily PARAFON FORTE DSC 500 MG TABS (CHLORZOXAZONE) 1 three times a day INDAPAMIDE 1.25 MG TABS (INDAPAMIDE) 1/ 2 once daily PERCOCET 5-325 MG TABS (OXYCODONE-ACETAMINOPHEN) 1-2 every 6 hours prn PROPOXYPHENE N-APAP 100-325 MG TABS (PROPOXYPHENE N-APAP) 1-2 every 4-6-hours as needed pain   Current Allergies (reviewed today): ! LIPITOR (ATORVASTATIN CALCIUM) ! * STATINS  ( CRESTOR) PREDNISONE INTENSOL (PREDNISONE) AMOXICILLIN  (AMOXICILLIN) CELLCEPT (MYCOPHENOLATE MOFETIL) CIPRO  Past Medical History:    Reviewed history from 03/13/2008 and no changes required:       Hypertension       B12 deff.       Allergic rhinitis       nephritis       neuropathy       Hyperlipidemia  Past Surgical History:    Reviewed history from 08/11/2007 and no changes required:       Inguinal herniorrhaphy       Lumbar laminectomy       Lumbar fusion       Tonsillectomy   Family History:    Reviewed history from 06/28/2007 and no changes required:       n/c  Social History:    Reviewed history from 05/05/2008 and no changes required:       Retired       Married              Former Smoker   Risk Factors: Tobacco use:  quit   Review of Systems  The patient denies anorexia, fever, weight loss, weight gain, vision loss, decreased hearing, hoarseness, chest pain, syncope, dyspnea on exertion, peripheral edema, prolonged cough, headaches, hemoptysis, abdominal pain, melena, hematochezia, severe indigestion/heartburn, hematuria, incontinence, genital sores, muscle weakness, suspicious skin lesions, transient blindness, difficulty walking, depression, unusual weight change, abnormal bleeding, enlarged lymph nodes, angioedema, breast masses, and testicular masses.     Physical Exam  General:     Well-developed,well-nourished,in no acute distress; alert,appropriate and cooperative throughout examination Head:     Normocephalic and atraumatic without obvious abnormalities. No apparent alopecia or balding. Eyes:     No corneal or conjunctival inflammation noted. EOMI. Perrla. Funduscopic exam benign, without hemorrhages, exudates or papilledema. Vision grossly normal. Ears:     R ear normal and L ear normal.   Nose:     nasal dischargemucosal pallor, mucosal erythema, and mucosal edema.   Mouth:     pharyngeal exudate and posterior lymphoid hypertrophy.   Neck:     No deformities, masses, or  tender and  stiff Lungs:     normal respiratory effort and no wheezes.   Heart:     normal rate and regular rhythm.      Impression & Recommendations:  Problem # 1:  HYPERTENSION (ICD-401.9)  His updated medication list for this problem includes:    Nadolol 20 Mg Tabs (Nadolol) .[name] Kitchen... 1/2 every day    Micardis 40 Mg Tabs (Telmisartan) ..... One by mouth daily at bed time    Indapamide 1.25 Mg Tabs (Indapamide) .[name] Kitchen... 1/ 2 once daily  BP today: 130/80 Prior BP: 118/74 (10/16/2008)  Prior 10 Yr Risk Heart Disease: 22 % (10/16/2008)  Labs Reviewed: Creat: 0.8 (10/16/2008) Chol: 419 (02/15/2008)   HDL: 74.0 (02/15/2008)   LDL: 267.6 (02/15/2008)   TG: 278 (02/15/2008)   Problem # 2:  HYPERLIPIDEMIA (ICD-272.4)  Labs Reviewed: Chol: 419 (02/15/2008)   HDL: 74.0 (02/15/2008)   LDL: 267.6 (02/15/2008)   TG: 278 (02/15/2008) SGOT: 25 (10/26/2007)   SGPT: 15 (10/26/2007)  Prior 10 Yr Risk Heart Disease: 22 % (10/16/2008)   Problem # 3:  EYE FLOATERS (ICD-379.24) discussio of eye floater post the cararact surgery  Complete Medication List: 1)  Cyanocobalamin 1000 Mcg/ml Soln (Cyanocobalamin) .... 1.5 ml every 3 weeks 2)  Diphenoxylate-atropine 2.5-0.025 Mg Tabs (Diphenoxylate-atropine) .... As needed 3)  Longs One Daily Multi Vitamin Tabs (Multiple vitamin) .... Take 1 tablet by mouth once a day 4)  Nadolol 20 Mg Tabs (Nadolol) .... 1/2 every day 5)  Betamethasone Dipropionate Aug 0.05 % Oint (Aug betamethasone dipropionate) .... Apply to rash 6)  Micardis 40 Mg Tabs (Telmisartan) .... One by mouth daily at bed time 7)  Indapamide 1.25 Mg Tabs (Indapamide) .... 1/ 2 once daily  Hypertension Assessment/Plan:      The patient's hypertensive risk group is category B: At least one risk factor (excluding diabetes) with no target organ damage.  His calculated 10 year risk of coronary heart disease is 27 %.  Today's blood pressure is 130/80.  His blood pressure goal is < 125/75.   Patient  Instructions: 1)  Please schedule a follow-up appointment in 4 months.   Prescriptions: MICARDIS 40 MG TABS (TELMISARTAN) one by mouth daily at bed time  #30 x 11   Entered and Authorized by:   [name] MD   Signed by:   [name] MD on 12/11/2008   Method used:   Electronically to        [facility] [id number]* (retail)       [address] [place], [place]  [address]       Ph: [phone]       Fax: [phone]   RxID:   [id number] INDAPAMIDE 1.25 MG TABS (INDAPAMIDE) 1/ 2 once daily  #30 x 11   Entered and Authorized by:  [name] MD   Signed by:   [name] MD on 12/11/2008   Method used:   Electronically to        [id number]* (retail)       [address] [place], [place]  [address]       Ph: [phone]       Fax: [phone]   RxID:   [id number]

## 2010-12-17 NOTE — Assessment & Plan Note (Signed)
Summary: cholesterol/mhf   Vital Signs:  Patient Profile:   75 Years Old Male Height:     72 inches Weight:      200 pounds Temp:     98.2 degrees F oral Pulse rate:   76 / minute Resp:     14 per minute BP sitting:   136 / 82  (left arm)  Vitals Entered By: Willy Eddy, LPN (March 13, 2008 8:01 AM)                 Chief Complaint:  c/o neuropathy to discuss //discuss labs from several weeks ago.  History of Present Illness: Current Problems:  R/O MULTIPLE  MYELOMA (ICD-203.00)   continuing to moniter for abnormal proteins probably related to nephropahy DERMATITIS, ATOPIC (ICD-691.8) IRRITABLE BOWEL SYNDROME (ICD-564.1) NEUROPATHY, IDIOPATHIC PERIPHERAL NEC (ICD-356.8)   worsening symptoms B12 DEFICIENCY (ICD-266.2) KIDNEY DISEASE (ICD-593.9) nephropathy on presdisone ALLERGIC RHINITIS (ICD-477.9) HYPERTENSION (ICD-401.9)  stable with weight gain    Hypertension History:      He denies headache, chest pain, palpitations, dyspnea with exertion, orthopnea, PND, peripheral edema, visual symptoms, neurologic problems, syncope, and side effects from treatment.        Positive major cardiovascular risk factors include male age 82 years old or older, hyperlipidemia, and hypertension.  Negative major cardiovascular risk factors include non-tobacco-user status.       Current Allergies: PREDNISONE INTENSOL (PREDNISONE) AMOXICILLIN (AMOXICILLIN) CELLCEPT (MYCOPHENOLATE MOFETIL) CIPRO (CIPROFLOXACIN)  Past Medical History:    Reviewed history from 08/11/2007 and no changes required:       Hypertension       B12 deff.       Allergic rhinitis       nephritis       neuropathy       Hyperlipidemia   Family History:    Reviewed history from 06/28/2007 and no changes required:       n/c  Social History:    Reviewed history from 06/28/2007 and no changes required:       Retired       Married       Never Smoked    Review of Systems  The patient denies  anorexia, fever, weight loss, weight gain, vision loss, decreased hearing, hoarseness, chest pain, syncope, dyspnea on exhertion, peripheral edema, prolonged cough, hemoptysis, abdominal pain, melena, hematochezia, severe indigestion/heartburn, hematuria, incontinence, genital sores, muscle weakness, suspicious skin lesions, transient blindness, difficulty walking, depression, unusual weight change, abnormal bleeding, enlarged lymph nodes, and angioedema.     Physical Exam  General:     Well-developed,well-nourished,in no acute distress; alert,appropriate and cooperative throughout examination Head:     male-pattern balding.   Eyes:     pupils equal and pupils round.   Ears:     External ear exam shows no significant lesions or deformities.  Otoscopic examination reveals clear canals, tympanic membranes are intact bilaterally without bulging, retraction, inflammation or discharge. Hearing is grossly normal bilaterally. Nose:     no external deformity and mucosal edema.   Mouth:     Oral mucosa and oropharynx without lesions or exudates.  Teeth in good repair. Neck:     No deformities, masses, or tenderness noted. Lungs:     Normal respiratory effort, chest expands symmetrically. Lungs are clear to auscultation, no crackles or wheezes. Heart:     normal rate, no murmur, and extrasystoles noted.   Extremities:     2+ left pedal edema and 2+ right pedal edema.  Impression & Recommendations:  Problem # 1:  HYPERTENSION (ICD-401.9)  His updated medication list for this problem includes:    Nadolol 20 Mg Tabs (Nadolol) .[name] Kitchen... 1/2 every day    Hyzaar 50-12.5 Mg Tabs (Losartan potassium-hctz) ..... Once daily  BP today: 136/82 Prior BP: 130/80 (01/12/2008)  Prior 10 Yr Risk Heart Disease: Not enough information (08/11/2007)  Labs Reviewed: Creat: 0.8 (10/26/2007) Chol: 419 (02/15/2008)   HDL: 74.0 (02/15/2008)   LDL: DEL (02/15/2008)   TG: 278  (02/15/2008)  Orders: TLB-Calcium (82310-CA) TLB-Magnesium (Mg) (83735-MG)   Problem # 2:  NEUROPATHY, IDIOPATHIC PERIPHERAL NEC (ICD-356.8)  Problem # 3:  KIDNEY DISEASE (ICD-593.9) measure lytes and mag. Calcium Orders: TLB-BMP (Basic Metabolic Panel-BMET) (80048-METABOL)   Problem # 4:  R/O MULTIPLE  MYELOMA (ICD-203.00) calcium level id high reinitiate work up  Problem # 5:  HYPERLIPIDEMIA (ICD-272.4)  Labs Reviewed: Chol: 419 (02/15/2008)   HDL: 74.0 (02/15/2008)   LDL: DEL (02/15/2008)   TG: 278 (02/15/2008) SGOT: 25 (10/26/2007)   SGPT: 15 (10/26/2007)  Prior 10 Yr Risk Heart Disease: Not enough information (08/11/2007)  His updated medication list for this problem includes:    Crestor 10 Mg Tabs (Rosuvastatin calcium) ..... One by mouth daily   Complete Medication List: 1)  Cyanocobalamin 1000 Mcg/ml Soln (Cyanocobalamin) .... Inject 1 mg intramuscularly 2)  Diphenoxylate-atropine 2.5-0.025 Mg Tabs (Diphenoxylate-atropine) .... As needed \\par  3)  Longs One Daily Multi Vitamin Tabs (Multiple vitamin) .... Take 1 tablet by mouth once a day 4)  Nadolol 20 Mg Tabs (Nadolol) .... 1/2 every day 5)  Hyzaar 50-12.5 Mg Tabs (Losartan potassium-hctz) .... Once daily 6)  Desowen 0.05 % Crea (Desonide) .... Apply with eucerin cream 50/50 to hands 7)  Prednisone 10 Mg Tabs (Prednisone) .... 3 per day 8)  Gabapentin 100 Mg Caps (Gabapentin) .... 2 per day 9)  Crestor 10 Mg Tabs (Rosuvastatin calcium) .... One by mouth daily  Other Orders: TLB-B12 + Folate Pnl (82746_82607-B12/FOL)  Hypertension Assessment/Plan:      The patient's hypertensive risk group is category B: At least one risk factor (excluding diabetes) with no target organ damage.  Today's blood pressure is 136/82.  His blood pressure goal is < 125/75.   Patient Instructions: 1)  Please schedule a follow-up appointment in 6 weeks. 2)  Hepatic Panel prior to visit, ICD-9: 995.20 3)  Lipid Panel prior to visit,  ICD-9:272.4    ]

## 2010-12-17 NOTE — Assessment & Plan Note (Signed)
Summary: b12//ccm   Nurse Visit   Allergies: 1)  ! Lipitor (Atorvastatin Calcium) 2)  ! * Statins  ( Crestor) 3)  Prednisone Intensol (Prednisone) 4)  Amoxicillin (Amoxicillin) 5)  Cellcept (Mycophenolate Mofetil) 6)  Cipro  Medication Administration  Injection # 1:    Medication: Vit B12 1000 mcg    Diagnosis: B12 DEFICIENCY (ICD-266.2)    Route: IM    Site: R deltoid    Exp Date: 01/15/2012    Lot #: [id number]    Mfr: [employer]    Comments: 1.102ml/1500mcg given    Patient tolerated injection without complications    Given by: Willy Eddy, LPN (June 25, 2010 12:34 PM)  Orders Added: 1)  Vit B12 1000 mcg [J3420] 2)  Admin of Therapeutic Inj  intramuscular or subcutaneous [04540]

## 2010-12-17 NOTE — Progress Notes (Signed)
Summary: neck and shoulder pain with nausea  Phone Note Call from Patient   Caller: Patient Call For: Dr. [name] Summary of Call: Pt is complaining of nausea, neck, shoulder and left arm pain.  Hand is tingling.  Doesn't think  he can wait until the 30 the for his appt. [phone] Initial call taken by: Lynann Beaver CMA,  October 03, 2008 8:02 AM  Follow-up for Phone Call        pt thinks it could be percocet causing nausea- pt offered appointment with  other md in building. declined and states he will try dct-n for pain. call if problems. Follow-up by: Willy Eddy, LPN,  October 03, 2008 8:23 AM

## 2010-12-17 NOTE — Procedures (Signed)
Summary: Colonoscopy  Patient: Timothy Gallagher Note: All result statuses are Final unless otherwise noted.  Tests: (1) Colonoscopy (COL)   COL Colonoscopy           DONE     [facility]     [address]     [place], [place]  [address]           COLONOSCOPY PROCEDURE REPORT           PATIENT:  Timothy Gallagher, Timothy Gallagher  MR#:  [id number]     BIRTHDATE:  1936-04-10, 73 yrs. old  GENDER:  male           ENDOSCOPIST:  Vania Rea. Jarold Motto, MD, [facility]     Referred by:           PROCEDURE DATE:  11/21/2009     PROCEDURE:  Colonoscopy with biopsy     ASA CLASS:  Class II     INDICATIONS:  unexplained diarrhea chronic ibs.           MEDICATIONS:   Fentanyl 25 mcg IV, Versed 5 mg IV           DESCRIPTION OF PROCEDURE:   After the risks benefits and     alternatives of the procedure were thoroughly explained, informed     consent was obtained.  Digital rectal exam was performed and     revealed no abnormalities.   The LB [id number] endoscope     was introduced through the anus and advanced to the terminal ileum     which was intubated for a short distance, without limitations.     The quality of the prep was excellent, using MoviPrep.  The     instrument was then slowly withdrawn as the colon was fully     examined.     <<PROCEDUREIMAGES>>     FINDINGS:  Scattered diverticula were found sigmoid to descending     No polyps or cancers were seen.  The terminal ileum appeared     normal. random colon biopsies done every 10 cm.   Retroflexed     views in the rectum revealed no abnormalities.    The scope was     then withdrawn from the patient and the procedure completed.           COMPLICATIONS:  None           ENDOSCOPIC IMPRESSION:     1) Diverticula, scattered in the sigmoid to descending     2) No polyps or cancers     3) Normal terminal ileum     CHRONIC IBS.R/O MICROSCOPIC/COLLAGENOUS COLITIS.     RECOMMENDATIONS:     1) Return to the care of your primary provider. GI follow  up as     needed     2) Await biopsy results     3) Repeat Colonscopy in 10 years.           REPEAT EXAM:  No           ______________________________     Vania Rea. Jarold Motto, MD, [facility]           CC:  [name], MD           n.     Rosalie DoctorMarland Kitchen   Vania Rea. Daundre Biel at 11/21/2009 02:57 PM           Renne Crigler, [id number]  Note: An exclamation mark (!) indicates a result that was not dispersed into the  flowsheet. Document Creation Date: 11/21/2009 2:57 PM _______________________________________________________________________  (1) Order result status: Final Collection or observation date-time: 11/21/2009 14:50 Requested date-time:  Receipt date-time:  Reported date-time:  Referring Physician:   Ordering Physician: Sheryn Bison [phone]) Specimen Source:  Source: Launa Grill Order Number: [id number] Lab site:   Appended Document: Colonoscopy     Procedures Next Due Date:    Colonoscopy: 11/2019

## 2010-12-17 NOTE — Letter (Signed)
Summary: [facility]  [facility]   Imported By: Maryln Gottron 05/16/2009 14:22:02  _____________________________________________________________________  External Attachment:    Type:   Image     Comment:   External Document

## 2010-12-17 NOTE — Letter (Signed)
Summary: [facility]  [facility]   Imported By: Maryln Gottron 04/11/2009 10:16:21  _____________________________________________________________________  External Attachment:    Type:   Image     Comment:   External Document

## 2010-12-17 NOTE — Progress Notes (Signed)
Summary: headache  Phone Note Call from Patient   Caller: Patient Call For: [name] MD Reason for Call: Acute Illness Summary of Call: Pt is still having headache and cannot take the Dolgic.  Wants to know what to do? [phone] Initial call taken by: Lynann Beaver CMA,  September 11, 2008 9:02 AM  Follow-up for Phone Call        parafon forte 1 three times a day for 5 days Follow-up by: Willy Eddy, LPN,  September 11, 2008 10:08 AM    New/Updated Medications: PARAFON FORTE DSC 500 MG TABS (CHLORZOXAZONE) 1 three times a day   Prescriptions: PARAFON FORTE DSC 500 MG TABS (CHLORZOXAZONE) 1 three times a day  #15 x 0   Entered by:   Willy Eddy, LPN   Authorized by:   [name] MD   Signed by:   Willy Eddy, LPN on 98/33/8250   Method used:   Electronically to        [facility] [address] #[id number]* (retail)       [address] [place], [place]  [address]       Ph: [phone]       Fax: [phone]   RxID:   [id number]   Appended Document: headache pt informed

## 2010-12-17 NOTE — Progress Notes (Signed)
Summary: BP  Phone Note Call from Patient Call back at Home Phone [phone]   Caller: Patient Summary of Call: pt called in says blood pressure was high 149/104 this morning then when he checked it again it went down to 147/94 advised pt to go to ER per MD if goes back up but pt says does not  want to go.   scared of catching flu if it goes back up he will go to ER later. Initial call taken by: Windell Norfolk,  September 16, 2008 12:41 PM

## 2010-12-17 NOTE — Assessment & Plan Note (Signed)
Summary: diarrhea every morning...as.    History of Present Illness Visit Type: follow up  Primary GI MD: Sheryn Bison MD FACP FAGA Primary Provider: Darryll Capers, MD Requesting Provider: n/a Chief Complaint: Diarrhea every morning x one month but stopped yesterday  History of Present Illness:   This patient is a 75 year old white male with chronic diarrhea predominant irritable bowel syndrome with a vague history in the past of inflammatory bowel disease requiring corticosteroid therapy in 2002. All of his problems have been exacerbated by rather marked chronic anxiety syndrome. He was seen earlier this year with lower abdominal pain and had CT scan of the abdomen and pelvis which was unremarkable except for diverticulosis. He has a chronically elevated sed rate of unexplained etiology in the past been treated for nephrotic syndrome and glomerulonephritis. The one time was on CellCept. He has multiple drug allergies.  He now complains of recurrent loose bowels without abdominal pain, rectal bleeding, or systemic complaints. He uses p.r.n. the mother to with good response. He takes chronic probiotic therapy in the past has responded to empiric treatment with oral Xifaxan therapy. He has chronic pernicious anemia and is on B12 shots. He is on multiple medications listed reviewed his chart for hypertension. His primary care physician Dr. Darryll Capers. Patient has a chronic dermatologic condition for which he uses occasional clindamycin facial gel. He denies been on other antibiotics within the last 6-12 months. He specifically denies systemic complaints such as fever, chills, current skin rashes, mouth sores etc. He denies any food intolerances or use of sorbitol or fructose in his diet.   GI Review of Systems      Denies abdominal pain, acid reflux, belching, bloating, chest pain, dysphagia with liquids, dysphagia with solids, heartburn, loss of appetite, nausea, vomiting, vomiting blood, weight  loss, and  weight gain.      Reports diarrhea.     Denies anal fissure, black tarry stools, change in bowel habit, constipation, diverticulosis, fecal incontinence, heme positive stool, hemorrhoids, irritable bowel syndrome, jaundice, light color stool, liver problems, rectal bleeding, and  rectal pain.    Current Medications (verified): 1)  Cyanocobalamin 1000 Mcg/ml Soln (Cyanocobalamin) .... 1.5 Ml Every 3 Weeks 2)  Diphenoxylate-Atropine 2.5-0.025 Mg Tabs (Diphenoxylate-Atropine) .... As Needed 3)  Nadolol 20 Mg Tabs (Nadolol) .... 1/2 Every Day 4)  Losartan Potassium 50 Mg Tabs (Losartan Potassium) .... One By Mouth Daily 5)  Indapamide 1.25 Mg Tabs (Indapamide) .... 1/ 2 Once Daily 6)  Centrum Silver  Tabs (Multiple Vitamins-Minerals) .... One Tablet By Mouth Once Daily 7)  Fish Oil   Oil (Fish Oil) .... One Tablet By Mouth Once Daily 8)  Tylenol Extra Strength 500 Mg Tabs (Acetaminophen) .... As Needed For Pain 9)  Xifaxan 550 Mg .... Take One Tablet By Mouth Two Times A Day 10)  Align   Caps (Misc Intestinal Flora Regulat) .... Take One Capsule By Mouth Daily 11)  Propoxyphene N-Apap 100-325 Mg Tabs (Propoxyphene N-Apap) .... One By Mouth Q 4-6 Hours Prn 12)  Clindamycin Phos-Benzoyl Perox 1-5 % Gel (Clindamycin Phos-Benzoyl Perox) .... Apply To Face Daily After Washing 13)  Astepro 0.15 % Soln (Azelastine Hcl) .... Two Spray in Each Nostril Daily 14)  Cvs Allergy Relief 10 Mg Tabs (Loratadine) .... One Tablet By Mouth Once Daily  Allergies (verified): 1)  ! Lipitor (Atorvastatin Calcium) 2)  ! * Statins  ( Crestor) 3)  Prednisone Intensol (Prednisone) 4)  Amoxicillin (Amoxicillin) 5)  Cellcept (Mycophenolate Mofetil) 6)  Cipro  Past History:  Past medical, surgical, family and social histories (including risk factors) reviewed for relevance to current acute and chronic problems.  Past Medical History: Reviewed history from 05/01/2009 and no changes required. Current  Problems:  RLQ PAIN (ICD-789.03) COLITIS (ICD-558.9) ESOPHAGEAL STRICTURE (ICD-530.3) GASTRITIS, CHRONIC (ICD-535.10) COLONIC POLYPS, HX OF (ICD-V12.72) DIVERTICULOSIS, COLON (ICD-562.10) ACTINIC KERATOSIS, HEAD (ICD-702.0) EYE FLOATERS (ICD-379.24) NECK PAIN (ICD-723.1) ESOPHAGEAL REFLUX (ICD-530.81) ECCHYMOSES, SPONTANEOUS (ICD-782.7) CHRONIC MAXILLARY SINUSITIS (ICD-473.0) BACK PAIN WITH RADICULOPATHY (ICD-729.2) HYPERLIPIDEMIA (ICD-272.4) R/O MULTIPLE  MYELOMA (ICD-203.00) DERMATITIS, ATOPIC (ICD-691.8) IRRITABLE BOWEL SYNDROME (ICD-564.1) NEUROPATHY, IDIOPATHIC PERIPHERAL NEC (ICD-356.8) B12 DEFICIENCY (ICD-266.2) KIDNEY DISEASE (ICD-593.9) ALLERGIC RHINITIS (ICD-477.9) HYPERTENSION (ICD-401.9)  Past Surgical History: Reviewed history from 08/11/2007 and no changes required. Inguinal herniorrhaphy Lumbar laminectomy Lumbar fusion Tonsillectomy  Family History: Reviewed history from 05/01/2009 and no changes required. n/c No FH of Colon Cancer:  Social History: Reviewed history from 05/01/2009 and no changes required. Retired Married Former Smoker Alcohol Use - no Illicit Drug Use - no Patient does not get regular exercise.  Daily Caffeine Use: occ  Review of Systems       The patient complains of back pain.  The patient denies allergy/sinus, anemia, anxiety-new, arthritis/joint pain, blood in urine, breast changes/lumps, change in vision, confusion, cough, coughing up blood, depression-new, fainting, fatigue, fever, headaches-new, hearing problems, heart murmur, heart rhythm changes, itching, muscle pains/cramps, night sweats, nosebleeds, shortness of breath, skin rash, sleeping problems, sore throat, swelling of feet/legs, swollen lymph glands, thirst - excessive, urination - excessive, urination changes/pain, urine leakage, vision changes, and voice change.    Vital Signs:  Patient profile:   75 year old male Height:      72 inches Weight:      194  pounds BMI:     26.41 BSA:     2.10 Pulse rate:   74 / minute Pulse rhythm:   regular BP sitting:   136 / 82  (left arm) Cuff size:   regular  Vitals Entered By: Ok Anis CMA (October 30, 2009 10:35 AM)  Physical Exam  General:  Well developed, well nourished, no acute distress.healthy appearing.   Head:  Normocephalic and atraumatic. Eyes:  PERRLA, no icterus.exam deferred to patient's ophthalmologist.   Lungs:  Clear throughout to auscultation. Heart:  Regular rate and rhythm; no murmurs, rubs,  or bruits. Abdomen:  Soft, nontender and nondistended. No masses, hepatosplenomegaly or hernias noted. Normal bowel sounds. Rectal:  Normal exam.hemocult negative.   Prostate:  .normal size prostate.   Msk:  Symmetrical with no gross deformities. Normal posture. Pulses:  Normal pulses noted. Extremities:  No clubbing, cyanosis, edema or deformities noted. Neurologic:  Alert and  oriented x4;  grossly normal neurologically. Cervical Nodes:  No significant cervical adenopathy. Inguinal Nodes:  No significant inguinal adenopathy. Psych:  Alert and cooperative. Normal mood and affect.   Impression & Recommendations:  Problem # 1:  ACNE ROSACEA (ICD-695.3) Assessment Improved  Problem # 2:  ELEVATED SEDIMENTATION RATE (ICD-790.1) Assessment: Unchanged Repeat sed rate, CRP, liver and renal profile ordered.  Problem # 3:  IRRITABLE BOWEL SYNDROME (ICD-564.1) Assessment: Deteriorated There is a question that this patient has low grade Crohn's ileocolitis. He also has a history of adenomatous colon polyps. Our previous colonoscopy and screening labs. I changed him from Align to [place] [facility] probiotic therapy. At the time of his colonoscopy biopsy will  be performed to exclude microscopic-collagenous colitis. The patient denies use of NSAIDs at this time. rders: TLB-CBC Platelet -  w/Differential (85025-CBCD) TLB-BMP (Basic Metabolic Panel-BMET)  (80048-METABOL) TLB-Hepatic/Liver Function Pnl (80076-HEPATIC) TLB-TSH (Thyroid Stimulating Hormone) (84443-TSH) TLB-B12, Serum-Total ONLY (21308-M57) TLB-Ferritin (82728-FER) TLB-Folic Acid (Folate) (82746-FOL) TLB-IBC Pnl (Iron/FE;Transferrin) (83550-IBC) TLB-Sedimentation Rate (ESR) (85652-ESR) TLB-CRP-High Sensitivity (C-Reactive Protein) (86140-FCRP) TLB-IgA (Immunoglobulin A) (82784-IGA) T-Sprue Panel (Celiac Disease Aby Eval) (83516x3/[address])  Problem # 4:  COLONIC POLYPS, HX OF (ICD-V12.72) Assessment: Unchanged  Orders: TLB-CBC Platelet - w/Differential (85025-CBCD) TLB-BMP (Basic Metabolic Panel-BMET) (80048-METABOL) TLB-Hepatic/Liver Function Pnl (80076-HEPATIC) TLB-TSH (Thyroid Stimulating Hormone) (84443-TSH) TLB-B12, Serum-Total ONLY (84696-E95) TLB-Ferritin (82728-FER) TLB-Folic Acid (Folate) (82746-FOL) TLB-IBC Pnl (Iron/FE;Transferrin) (83550-IBC) TLB-Sedimentation Rate (ESR) (85652-ESR) TLB-CRP-High Sensitivity (C-Reactive Protein) (86140-FCRP) TLB-IgA (Immunoglobulin A) (82784-IGA) T-Sprue Panel (Celiac Disease Aby Eval) (83516x3/[address])  Problem # 5:  B12 DEFICIENCY (ICD-266.2) Assessment: Improved Continue regular parenteral therapy. Orders: TLB-CBC Platelet - w/Differential (85025-CBCD) TLB-BMP (Basic Metabolic Panel-BMET) (80048-METABOL) TLB-Hepatic/Liver Function Pnl (80076-HEPATIC) TLB-TSH (Thyroid Stimulating Hormone) (84443-TSH) TLB-B12, Serum-Total ONLY (28413-K44) TLB-Ferritin (82728-FER) TLB-Folic Acid (Folate) (82746-FOL) TLB-IBC Pnl (Iron/FE;Transferrin) (83550-IBC) TLB-Sedimentation Rate (ESR) (85652-ESR) TLB-CRP-High Sensitivity (C-Reactive Protein) (86140-FCRP) TLB-IgA (Immunoglobulin A) (82784-IGA) T-Sprue Panel (Celiac Disease Aby Eval) (83516x3/[address])  Problem # 6:  KIDNEY DISEASE (ICD-593.9) Assessment: Improved repeat labs ordered.  Patient Instructions: 1)  Copy sent to : Dr. Darryll Capers, Dr. Casimiro Needle at  [facility] 2)  Please continue current medications.  3)  Labs pending 4)  Change probiotic therapy 5)  Continue p.r.n. motor use  Appended Document: diarrhea every morning...as.    Clinical Lists Changes  Medications: Added new medication of MOVIPREP 100 GM  SOLR (PEG-KCL-NACL-NASULF-NA ASC-C) As per prep instructions. - Signed Changed medication from ALIGN   CAPS (MISC INTESTINAL FLORA REGULAT) Take one capsule by mouth daily to [facility]  CAPS (PROBIOTIC PRODUCT) 1 by mouth qd Rx of MOVIPREP 100 GM  SOLR (PEG-KCL-NACL-NASULF-NA ASC-C) As per prep instructions.;  #1 x 0;  Signed;  Entered by: Ashok Cordia RN;  Authorized by: Mardella Layman MD [facility];  Method used: Electronically to [id number]*, [address] [place], [place]  [address], Ph: [phone], Fax: [phone] Orders: Added new Test order of Colonoscopy (Colon) - Signed    Prescriptions: MOVIPREP 100 GM  SOLR (PEG-KCL-NACL-NASULF-NA ASC-C) As per prep instructions.  #1 x 0   Entered by:   Ashok Cordia RN   Authorized by:   Mardella Layman MD [facility]   Signed by:   Ashok Cordia RN on 10/30/2009   Method used:   Electronically to        [id number]* (retail)       [address] [place], [place]  [address]       Ph: [phone]       Fax: [phone]   RxID:   [id number]

## 2010-12-17 NOTE — Progress Notes (Signed)
Summary: jaw swelling  Phone Note Call from Patient Call back at Home Phone [phone]   Caller: Patient Call For: [name] Summary of Call: pt says he had swelling in the jaws last night, but it went away. He wants to know if  the side effects coming from the med he's currently taking: Hyvaar 50-12.5 mg, Lyrica 50mg ? Pls call     Initial call taken by: Shan Levans,  August 24, 2007 8:49 AM  Follow-up for Phone Call        Called pt back, he has already contacted Dr Grace Bushy office re: Hyzaar and Dr Imagene Gurney office re: Roselee Nova.  He is not going to take these meds until he hears back from their offices. FYI  Dr.Jenkins Follow-up by: Sid Falcon LPN,  August 24, 2007 9:14 AM

## 2010-12-17 NOTE — Assessment & Plan Note (Signed)
Summary: personal/jnl/WANTS B12 ALSO/WIFE RESCD/CCM   Vital Signs:  Patient Profile:   75 Years Old Male Height:     72 inches Weight:      190 pounds Temp:     98.2 degrees F Pulse rate:   72 / minute Resp:     12 per minute BP sitting:   120 / 80  (left arm)  Vitals Entered By: Willy Eddy, LPN (June 28, 2007 5:08 PM)               Chief Complaint:  c/o of legs swellingand requesting b 12 injection.  History of Present Illness: patient is seen today for acute visit for increased swelling in his lower extremities with a history of nephritis.  Patient has been seen by nephrology and at one time was placed on CellCept for a glomerular nephritis and resultant proteinuria and hematuria.  He presents today with increased signs of weakness and increased swelling of his lower extremity.  His been seen by a neurologist for peripheral neuropathy.and that workup was unremarkable for any new diagnoses other than his B12 deficiency.  He is been seen by Dr. Lowell Guitar and Dr. Earlene Plater.  Current Allergies: PREDNISONE INTENSOL (PREDNISONE) AMOXICILLIN (AMOXICILLIN) CELLCEPT (MYCOPHENOLATE MOFETIL) CIPRO (CIPROFLOXACIN)  Past Medical History:    Reviewed history from 04/09/2007 and no changes required:       Hypertension       B12 deff.       Allergic rhinitis       nephritis          Family History:    Reviewed history and no changes required:       n/c  Social History:    Retired    Married    Never Smoked   Risk Factors:  Tobacco use:  never   Review of Systems       generally he feels poorly, but denies any weight loss, chest pain, palpitations, or increased shortness of breath, abdominal pain, bloody or black stools, increasing indigestion, or worsening neuropathy.     Physical Exam  General:     thin pale, white, male, in no apparent distress Head:     Normocephalic and atraumatic without obvious abnormalities. No apparent alopecia or balding. Eyes:     No corneal or conjunctival inflammation noted. EOMI. Perrla. Funduscopic exam benign, without hemorrhages, exudates or papilledema. Vision grossly normal. Ears:     External ear exam shows no significant lesions or deformities.  Otoscopic examination reveals clear canals, tympanic membranes are intact bilaterally without bulging, retraction, inflammation or discharge. Hearing is grossly normal bilaterally. Nose:     External nasal examination shows no deformity or inflammation, nasal dischargemucosal pallor.   Mouth:     Oral mucosa and oropharynx without lesions or exudates.  Teeth in good repair. Neck:     supple, full range of motion Chest Wall:     pectus excavatum.   Lungs:     normal respiratory effort.   Heart:     normal rate and regular rhythm.   Abdomen:     soft nontender, without masses, no guarding, rigidity, or rebound tenderness Msk:     tender over the ankles, wrists, swelling of the MCPs and PIPs of his hands bilaterally Pulses:     decreased in the lower extremity plus one Extremities:     plus two pitting edema to the midcalf Neurologic:     alert & oriented X3.  decreased sensation to light touch and  pinprick in the lower extremity in a stocking distribution Skin:     some hyperpigmentation to lower extremity along the tibial plateau, and the dorsum of the foot Cervical Nodes:     No lymphadenopathy noted Axillary Nodes:     No palpable lymphadenopathy Psych:     Oriented X3 and memory intact for recent and remote.      Impression & Recommendations:  Problem # 1:  KIDNEY DISEASE (ICD-593.9) was on celcept for glomerulonephritis now has symptoms of increased dz. Has an appointment with Dr Earlene Plater. who will refer him to [facility]. Increased edema.I am concerned that he is recurrent glomerulonephritis and agree with the plan to consult with Dr. Earlene Plater for referral to [facility].  The patient does not wish to return to Dr. Lowell Guitar at this time  Problem # 2:   HYPERTENSION (ICD-401.9) his blood pressure is well controlled, and with a blood pressure of 120/80.  Hesitant to Lasix.  The swelling in his lower extremities.  He may need prednisone, and he may well need to resume the CellCept.we will defer this decision to the renal consult His updated medication list for this problem includes:    Nadolol 20 Mg Tabs (Nadolol) .[name] Kitchen... 1/2 every day  BP today: 120/80  Labs Reviewed: Creat: 0.78 (03/12/2007)   Problem # 3:  B12 DEFICIENCY (ICD-266.2) patient has peripheral neuropathy felt to be due to B12 deficiency, and has been examined by Dr. love of neurology Orders: Vit B12 1000 mcg (J3420) Admin of Therapeutic Inj  intramuscular or subcutaneous (60109)   Problem # 4:  ALLERGIC RHINITIS (ICD-477.9) patient is tolerating the medication with good control of his allergy symptoms The following medications were removed from the medication list:    Xyzal 5 Mg Tabs (Levocetirizine dihydrochloride) .[name] Kitchen... Take 1 tablet by mouth once a day   Complete Medication List: 1)  Cyanocobalamin 1000 Mcg/ml Soln (Cyanocobalamin) .... Inject 1 mg intramuscularly 2)  Diphenoxylate-atropine 2.5-0.025 Mg Tabs (Diphenoxylate-atropine) 3)  Longs One Daily Multi Vitamin Tabs (Multiple vitamin) .... Take 1 tablet by mouth once a day 4)  Nadolol 20 Mg Tabs (Nadolol) .... 1/2 every day   Patient Instructions: 1)  serum immuno electrophoresis  593.9 this week and ov in 6 weeks.          Medication Administration  Injection # 1:    Medication: Vit B12 1000 mcg    Diagnosis: B12 DEFICIENCY (ICD-266.2)    Route: IM    Site: L deltoid    Exp Date: 09/17/2008    Lot #: [id number]    Mfr: american regnet    Patient tolerated injection without complications    Given by: Willy Eddy, LPN (June 28, 2007 5:11 PM)  Orders Added: 1)  Vit B12 1000 mcg [J3420] 2)  Admin of Therapeutic Inj  intramuscular or subcutaneous [90772] 3)  Est. Patient Level IV [57322]

## 2010-12-17 NOTE — Progress Notes (Signed)
Summary: headache  Phone Note Call from Patient   Caller: Patient Call For: Dr. [name]  Reason for Call: Acute Illness Summary of Call: Headache is getting worse and going down his neck, spine and arm.  Cannot sleep. [phone] Initial call taken by: Lynann Beaver CMA,  September 14, 2008 8:35 AM  Follow-up for Phone Call        per dr Gershon Mussel- tspine with flexion and extension Follow-up by: Willy Eddy, LPN,  September 14, 2008 9:46 AM  New Problems: NECK PAIN (ICD-723.1)   New Problems: NECK PAIN (ICD-723.1)

## 2010-12-17 NOTE — Assessment & Plan Note (Signed)
Summary: B 12 INJ//ALP   Nurse Visit   Allergies: 1)  ! Lipitor (Atorvastatin Calcium) 2)  ! * Statins  ( Crestor) 3)  Prednisone Intensol (Prednisone) 4)  Amoxicillin (Amoxicillin) 5)  Cellcept (Mycophenolate Mofetil) 6)  Cipro  Medication Administration  Injection # 1:    Medication: Vit B12 1000 mcg    Diagnosis: B12 DEFICIENCY (ICD-266.2)    Route: IM    Site: L deltoid    Exp Date: 01/15/2012    Lot #: [id number]    Mfr: American Regent    Comments: 1.5 ml/1529mcg given    Patient tolerated injection without complications    Given by: Willy Eddy, LPN (July 16, 2010 12:17 PM)  Orders Added: 1)  Vit B12 1000 mcg [J3420] 2)  Admin of Therapeutic Inj  intramuscular or subcutaneous [72536]

## 2010-12-17 NOTE — Assessment & Plan Note (Signed)
Summary: B-12 INJ/CJR   Nurse Visit   Allergies: 1)  ! Lipitor (Atorvastatin Calcium) 2)  ! * Statins  ( Crestor) 3)  Prednisone Intensol (Prednisone) 4)  Amoxicillin (Amoxicillin) 5)  Cellcept (Mycophenolate Mofetil) 6)  Cipro  Medication Administration  Injection # 1:    Medication: Vit B12 1000 mcg    Diagnosis: B12 DEFICIENCY (ICD-266.2)    Route: IM    Site: R deltoid    Exp Date: 01/15/2012    Lot #: [id number]    Mfr: American Regent    Comments: 1.46ml/1500mcg given    Patient tolerated injection without complications    Given by: Willy Eddy, LPN (June 04, 2010 12:21 PM)  Orders Added: 1)  Vit B12 1000 mcg [J3420] 2)  Admin of Therapeutic Inj  intramuscular or subcutaneous [16109]

## 2010-12-17 NOTE — Assessment & Plan Note (Signed)
Summary: B12 W/BONNYE/CCM  Nurse Visit    Prior Medications: CYANOCOBALAMIN 1000 MCG/ML SOLN (CYANOCOBALAMIN) Inject 1 mg intramuscularly DIPHENOXYLATE-ATROPINE 2.5-0.025 MG TABS (DIPHENOXYLATE-ATROPINE) as needed \\par  LONGS ONE DAILY MULTI VITAMIN  TABS (MULTIPLE VITAMIN) Take 1 tablet by mouth once a day NADOLOL 20 MG TABS (NADOLOL) 1/2 every day XYZAL 5 MG  TABS (LEVOCETIRIZINE DIHYDROCHLORIDE) once daily HYZAAR 50-12.5 MG  TABS (LOSARTAN POTASSIUM-HCTZ) once daily LYRICA 50 MG  CAPS (PREGABALIN) on po q HS FISH OIL CONCENTRATE 1000 MG  CAPS (OMEGA-3 FATTY ACIDS) 2-4 capsules daily Current Allergies: PREDNISONE INTENSOL (PREDNISONE) AMOXICILLIN (AMOXICILLIN) CELLCEPT (MYCOPHENOLATE MOFETIL) CIPRO (CIPROFLOXACIN)  Complete Medication List: 1)  Cyanocobalamin 1000 Mcg/ml Soln (Cyanocobalamin) .... Inject 1 mg intramuscularly 2)  Diphenoxylate-atropine 2.5-0.025 Mg Tabs (Diphenoxylate-atropine) .... As needed \\par  3)  Longs One Daily Multi Vitamin Tabs (Multiple vitamin) .... Take 1 tablet by mouth once a day 4)  Nadolol 20 Mg Tabs (Nadolol) .... 1/2 every day 5)  Xyzal 5 Mg Tabs (Levocetirizine dihydrochloride) .... Once daily 6)  Hyzaar 50-12.5 Mg Tabs (Losartan potassium-hctz) .... Once daily 7)  Lyrica 50 Mg Caps (Pregabalin) .... On po q hs 8)  Fish Oil Concentrate 1000 Mg Caps (Omega-3 fatty acids) .... 2-4 capsules daily     Medication Administration  Injection # 1:    Medication: Vit B12 1000 mcg    Diagnosis: B12 DEFICIENCY (ICD-266.2)    Route: IM    Site: L deltoid    Exp Date: 02/15/2009    Lot #: [id number]    Patient tolerated injection without complications    Given by: Willy Eddy, LPN (September 22, 2007 11:46 AM)  Orders Added: 1)  Vit B12 1000 mcg [J3420] 2)  Admin of Therapeutic Inj  intramuscular or subcutaneous Quintilian.Boros    ]  Medication Administration  Injection # 1:    Medication: Vit B12 1000 mcg    Diagnosis: B12 DEFICIENCY (ICD-266.2)   Route: IM    Site: L deltoid    Exp Date: 02/15/2009    Lot #: [id number]    Patient tolerated injection without complications    Given by: Willy Eddy, LPN (September 22, 2007 11:46 AM)  Orders Added: 1)  Vit B12 1000 mcg [J3420] 2)  Admin of Therapeutic Inj  intramuscular or subcutaneous [90772]

## 2010-12-17 NOTE — Assessment & Plan Note (Signed)
Summary: B-12 INJ // RS   Nurse Visit   Allergies: 1)  ! Lipitor (Atorvastatin Calcium) 2)  ! * Statins  ( Crestor) 3)  Prednisone Intensol (Prednisone) 4)  Amoxicillin (Amoxicillin) 5)  Cellcept (Mycophenolate Mofetil) 6)  Cipro  Medication Administration  Injection # 1:    Medication: Vit B12 1000 mcg    Diagnosis: B12 DEFICIENCY (ICD-266.2)    Route: IM    Site: R deltoid    Exp Date: 07/18/2011    Lot #: [id number]    Mfr: [employer]ent    Comments: 1.37ml.1500 micrograms given    Patient tolerated injection without complications    Given by: Willy Eddy, LPN (November 30, 2009 12:34 PM)  Orders Added: 1)  Vit B12 1000 mcg [J3420] 2)  Admin of Therapeutic Inj  intramuscular or subcutaneous [[id number]]

## 2010-12-17 NOTE — Progress Notes (Signed)
Summary: Mucinex?  Phone Note Call from Patient   Caller: Patient Call For: [name] MD Summary of Call: Pt calls in asking if he can Mucinex DM for chest congestion?? [phone] Initial call taken by: Lynann Beaver CMA,  August 16, 2009 10:20 AM  Follow-up for Phone Call        ok per dr [name] to take Follow-up by: Willy Eddy, LPN,  August 16, 2009 11:03 AM  Additional Follow-up for Phone Call Additional follow up Details #1::        Pt notified. Additional Follow-up by: Lynann Beaver CMA,  August 16, 2009 11:06 AM

## 2010-12-17 NOTE — Progress Notes (Signed)
Summary: lab results.  Phone Note Call from Patient   Caller: Patient Call For: [name] MD Reason for Call: Acute Illness Summary of Call: Pt would like lab results.  [phone] Initial call taken by: Lynann Beaver CMA,  February 19, 2010 11:29 AM  Follow-up for Phone Call        per dr [name] red yeast rice an d drink more water- pt informed Follow-up by: Willy Eddy, LPN,  February 19, 2010 11:46 AM

## 2010-12-17 NOTE — Assessment & Plan Note (Signed)
Summary: sinuses//b12 inj//ccm   Vital Signs:  Patient profile:   75 year old male Height:      72 inches Weight:      198 pounds BMI:     26.95 Temp:     98.2 degrees F oral Pulse rate:   68 / minute Resp:     14 per minute BP sitting:   130 / 80  (left arm)  Vitals Entered By: Willy Eddy, LPN (May 14, 2010 10:19 AM) CC: c/o sinusitis like sx with n&v over the weekend, Hypertension Management   Primary Care Provider:  Darryll Capers, MD  CC:  c/o sinusitis like sx with n&v over the weekend and Hypertension Management.  History of Present Illness: the pt had a URI that has improved the pt has several weeks of conjecton and fever he has improved over the last 48 hours neuropathy improved we discussed the potential shortage of B 12 shots I have spent greater that 30 min face to face evaluating this patient   Hypertension History:      He denies headache, chest pain, palpitations, dyspnea with exertion, orthopnea, PND, peripheral edema, visual symptoms, neurologic problems, syncope, and side effects from treatment.        Positive major cardiovascular risk factors include male age 55 years old or older, hyperlipidemia, and hypertension.  Negative major cardiovascular risk factors include non-tobacco-user status.     Preventive Screening-Counseling & Management  Alcohol-Tobacco     Smoking Status: quit     Packs/Day: 0.75     Year Started: 1964     Year Quit: 2003  Problems Prior to Update: 1)  Gastroenteritis, Viral  (ICD-008.8) 2)  Acne Rosacea  (ICD-695.3) 3)  Chest Wall Pain, Acute  (ICD-786.52) 4)  Abdominal Pain Other Specified Site  (ICD-789.09) 5)  Elevated Sedimentation Rate  (ICD-790.1) 6)  Rlq Pain  (ICD-789.03) 7)  Colitis  (ICD-558.9) 8)  Esophageal Stricture  (ICD-530.3) 9)  Gastritis, Chronic  (ICD-535.10) 10)  Colonic Polyps, Hx of  (ICD-V12.72) 11)  Diverticulosis, Colon  (ICD-562.10) 12)  Actinic Keratosis, Head  (ICD-702.0) 13)  Eye  Floaters  (ICD-379.24) 14)  Neck Pain  (ICD-723.1) 15)  Ecchymoses, Spontaneous  (ICD-782.7) 16)  Chronic Maxillary Sinusitis  (ICD-473.0) 17)  Back Pain With Radiculopathy  (ICD-729.2) 18)  Hyperlipidemia  (ICD-272.4) 19)  Dermatitis, Atopic  (ICD-691.8) 20)  Irritable Bowel Syndrome  (ICD-564.1) 21)  Neuropathy, Idiopathic Peripheral Nec  (ICD-356.8) 22)  B12 Deficiency  (ICD-266.2) 23)  Kidney Disease  (ICD-593.9) 24)  Allergic Rhinitis  (ICD-477.9) 25)  Hypertension  (ICD-401.9)  Current Problems (verified): 1)  Gastroenteritis, Viral  (ICD-008.8) 2)  Acne Rosacea  (ICD-695.3) 3)  Chest Wall Pain, Acute  (ICD-786.52) 4)  Abdominal Pain Other Specified Site  (ICD-789.09) 5)  Elevated Sedimentation Rate  (ICD-790.1) 6)  Rlq Pain  (ICD-789.03) 7)  Colitis  (ICD-558.9) 8)  Esophageal Stricture  (ICD-530.3) 9)  Gastritis, Chronic  (ICD-535.10) 10)  Colonic Polyps, Hx of  (ICD-V12.72) 11)  Diverticulosis, Colon  (ICD-562.10) 12)  Actinic Keratosis, Head  (ICD-702.0) 13)  Eye Floaters  (ICD-379.24) 14)  Neck Pain  (ICD-723.1) 15)  Ecchymoses, Spontaneous  (ICD-782.7) 16)  Chronic Maxillary Sinusitis  (ICD-473.0) 17)  Back Pain With Radiculopathy  (ICD-729.2) 18)  Hyperlipidemia  (ICD-272.4) 19)  Dermatitis, Atopic  (ICD-691.8) 20)  Irritable Bowel Syndrome  (ICD-564.1) 21)  Neuropathy, Idiopathic Peripheral Nec  (ICD-356.8) 22)  B12 Deficiency  (ICD-266.2) 23)  Kidney Disease  (ICD-593.9) 24)  Allergic Rhinitis  (ICD-477.9) 25)  Hypertension  (ICD-401.9)  Medications Prior to Update: 1)  Cyanocobalamin 1000 Mcg/ml Soln (Cyanocobalamin) .... 1.5 Ml Every 3 Weeks 2)  Diphenoxylate-Atropine 2.5-0.025 Mg Tabs (Diphenoxylate-Atropine) .... As Needed 3)  Nadolol 20 Mg Tabs (Nadolol) .... 1/2 Every Day 4)  Losartan Potassium 50 Mg Tabs (Losartan Potassium) .... One By Mouth Daily 5)  Indapamide 1.25 Mg Tabs (Indapamide) .... 1/ 2 Once Daily 6)  Centrum Silver  Tabs (Multiple  Vitamins-Minerals) .... One Tablet By Mouth Once Daily 7)  Fish Oil   Oil (Fish Oil) .... One Tablet By Mouth Once Daily 8)  Tylenol Extra Strength 500 Mg Tabs (Acetaminophen) .... As Needed For Pain 9)  Clindamycin Phos-Benzoyl Perox 1-5 % Gel (Clindamycin Phos-Benzoyl Perox) .... Apply To Face Daily After Washing 10)  Astepro 0.15 % Soln (Azelastine Hcl) .... Two Spray in Each Nostril Daily 11)  Cvs Allergy Relief 10 Mg Tabs (Loratadine) .... One Tablet By Mouth Once Daily 12)  Red Yeast Rice 600 Mg Tabs (Red Yeast Rice Extract)  Current Medications (verified): 1)  Cyanocobalamin 1000 Mcg/ml Soln (Cyanocobalamin) .... 1.5 Ml Every 3 Weeks 2)  Diphenoxylate-Atropine 2.5-0.025 Mg Tabs (Diphenoxylate-Atropine) .... As Needed 3)  Nadolol 20 Mg Tabs (Nadolol) .... 1/2 Every Day 4)  Losartan Potassium 50 Mg Tabs (Losartan Potassium) .... One By Mouth Daily 5)  Indapamide 1.25 Mg Tabs (Indapamide) .... 1/ 2 Once Daily 6)  Centrum Silver  Tabs (Multiple Vitamins-Minerals) .... One Tablet By Mouth Once Daily 7)  Fish Oil   Oil (Fish Oil) .... One Tablet By Mouth Once Daily 8)  Tylenol Extra Strength 500 Mg Tabs (Acetaminophen) .... As Needed For Pain 9)  Clindamycin Phos-Benzoyl Perox 1-5 % Gel (Clindamycin Phos-Benzoyl Perox) .... Apply To Face Daily After Washing 10)  Astepro 0.15 % Soln (Azelastine Hcl) .... Two Spray in Each Nostril Daily 11)  Cvs Allergy Relief 10 Mg Tabs (Loratadine) .... One Tablet By Mouth Once Daily 12)  Red Yeast Rice 600 Mg Tabs (Red Yeast Rice Extract)  Allergies (verified): 1)  ! Lipitor (Atorvastatin Calcium) 2)  ! * Statins  ( Crestor) 3)  Prednisone Intensol (Prednisone) 4)  Amoxicillin (Amoxicillin) 5)  Cellcept (Mycophenolate Mofetil) 6)  Cipro  Past History:  Family History: Last updated: 05/01/2009 n/c No FH of Colon Cancer:  Social History: Last updated: 05/01/2009 Retired Married Former Smoker Alcohol Use - no Illicit Drug Use - no Patient  does not get regular exercise.  Daily Caffeine Use: occ  Risk Factors: Exercise: no (05/01/2009)  Risk Factors: Smoking Status: quit (05/14/2010) Packs/Day: 0.75 (05/14/2010)  Past medical, surgical, family and social histories (including risk factors) reviewed, and no changes noted (except as noted below).  Past Medical History: Reviewed history from 05/01/2009 and no changes required. Current Problems:  RLQ PAIN (ICD-789.03) COLITIS (ICD-558.9) ESOPHAGEAL STRICTURE (ICD-530.3) GASTRITIS, CHRONIC (ICD-535.10) COLONIC POLYPS, HX OF (ICD-V12.72) DIVERTICULOSIS, COLON (ICD-562.10) ACTINIC KERATOSIS, HEAD (ICD-702.0) EYE FLOATERS (ICD-379.24) NECK PAIN (ICD-723.1) ESOPHAGEAL REFLUX (ICD-530.81) ECCHYMOSES, SPONTANEOUS (ICD-782.7) CHRONIC MAXILLARY SINUSITIS (ICD-473.0) BACK PAIN WITH RADICULOPATHY (ICD-729.2) HYPERLIPIDEMIA (ICD-272.4) R/O MULTIPLE  MYELOMA (ICD-203.00) DERMATITIS, ATOPIC (ICD-691.8) IRRITABLE BOWEL SYNDROME (ICD-564.1) NEUROPATHY, IDIOPATHIC PERIPHERAL NEC (ICD-356.8) B12 DEFICIENCY (ICD-266.2) KIDNEY DISEASE (ICD-593.9) ALLERGIC RHINITIS (ICD-477.9) HYPERTENSION (ICD-401.9)  Past Surgical History: Reviewed history from 08/11/2007 and no changes required. Inguinal herniorrhaphy Lumbar laminectomy Lumbar fusion Tonsillectomy  Family History: Reviewed history from 05/01/2009 and no changes required. n/c No FH of Colon Cancer:  Social History: Reviewed history from 05/01/2009 and  no changes required. Retired Married Former Smoker Alcohol Use - no Illicit Drug Use - no Patient does not get regular exercise.  Daily Caffeine Use: occ  Review of Systems  The patient denies anorexia, fever, weight loss, weight gain, vision loss, decreased hearing, hoarseness, chest pain, syncope, dyspnea on exertion, peripheral edema, prolonged cough, headaches, hemoptysis, abdominal pain, melena, hematochezia, severe indigestion/heartburn, hematuria, incontinence,  genital sores, muscle weakness, suspicious skin lesions, transient blindness, difficulty walking, depression, unusual weight change, abnormal bleeding, enlarged lymph nodes, angioedema, and breast masses.    Physical Exam  General:  Well-developed,well-nourished,in no acute distress; alert,appropriate and cooperative throughout examination Head:  Normocephalic and atraumatic. Eyes:  PERRLA, no icterus.exam deferred to patient's ophthalmologist.   Ears:  R ear normal and L ear normal.   Nose:  nasal dischargemucosal pallor, mucosal erythema, and mucosal edema.   Mouth:  pharyngeal exudate and posterior lymphoid hypertrophy.   Neck:  No deformities, masses, or tenderness noted. Lungs:  Normal respiratory effort, chest expands symmetrically. Lungs are clear to auscultation, no crackles or wheezes. Heart:  Normal rate and regular rhythm. S1 and S2 normal without gallop, murmur, click, rub or other extra sounds. Abdomen:  Bowel sounds positive,abdomen soft and non-tender without masses, organomegaly or hernias noted.   Impression & Recommendations:  Problem # 1:  B12 DEFICIENCY (ICD-266.2) monitering of b12 and discusion Orders: Vit B12 1000 mcg (J3420) Admin of Therapeutic Inj  intramuscular or subcutaneous (16109)  Problem # 2:  IRRITABLE BOWEL SYNDROME (ICD-564.1)  Problem # 3:  NEUROPATHY, IDIOPATHIC PERIPHERAL NEC (ICD-356.8) stable with teh b 12  Problem # 4:  GASTROENTERITIS, VIRAL (ICD-008.8)  Problem # 5:  ACUTE MAXILLARY SINUSITIS (ICD-461.0)  His updated medication list for this problem includes:    Astepro 0.15 % Soln (Azelastine hcl) .[name] Kitchen..[name] Kitchen Two spray in each nostril daily    Azithromycin 250 Mg Tabs (Azithromycin) .[name] Kitchen..[name] Kitchen Two by mouth now then one by mouth daily for 4 days  Instructed on treatment. Call if symptoms persist or worsen.   Complete Medication List: 1)  Cyanocobalamin 1000 Mcg/ml Soln (Cyanocobalamin) .... 1.5 ml every 3 weeks 2)  Diphenoxylate-atropine  2.5-0.025 Mg Tabs (Diphenoxylate-atropine) .... As needed 3)  Nadolol 20 Mg Tabs (Nadolol) .... 1/2 every day 4)  Losartan Potassium 50 Mg Tabs (Losartan potassium) .... One by mouth daily 5)  Indapamide 1.25 Mg Tabs (Indapamide) .... 1/ 2 once daily 6)  Centrum Silver Tabs (Multiple vitamins-minerals) .... One tablet by mouth once daily 7)  Fish Oil Oil (Fish oil) .... One tablet by mouth once daily 8)  Tylenol Extra Strength 500 Mg Tabs (Acetaminophen) .... As needed for pain 9)  Clindamycin Phos-benzoyl Perox 1-5 % Gel (Clindamycin phos-benzoyl perox) .... Apply to face daily after washing 10)  Astepro 0.15 % Soln (Azelastine hcl) .... Two spray in each nostril daily 11)  Cvs Allergy Relief 10 Mg Tabs (Loratadine) .... One tablet by mouth once daily 12)  Red Yeast Rice 600 Mg Tabs (Red yeast rice extract) 13)  Azithromycin 250 Mg Tabs (Azithromycin) .... Two by mouth now then one by mouth daily for 4 days  Hypertension Assessment/Plan:      The patient's hypertensive risk group is category B: At least one risk factor (excluding diabetes) with no target organ damage.  Today's blood pressure is 130/80.  His blood pressure goal is < 125/75.  Patient Instructions: 1)  keep appointments Prescriptions: AZITHROMYCIN 250 MG TABS (AZITHROMYCIN) two by mouth now then one by mouth daily for  4 days  #6 x 0   Entered and Authorized by:   [name] MD   Signed by:   [name] MD on 05/14/2010   Method used:   Print then Give to Patient   RxID:   [id number]    Medication Administration  Injection # 1:    Medication: Vit B12 1000 mcg    Diagnosis: B12 DEFICIENCY (ICD-266.2)    Route: IM    Site: L deltoid    Exp Date: 08/15/2011    Lot #: [id number]    Mfr: [employer] Regent    Comments: 1.5 ml/1510mcg    Patient tolerated injection without complications    Given by: Willy Eddy, LPN (May 14, 2010 10:20 AM)  Orders Added: 1)  Vit B12 1000 mcg [J3420] 2)  Admin of  Therapeutic Inj  intramuscular or subcutaneous [96372] 3)  Est. Patient Level IV [33295]

## 2010-12-17 NOTE — Letter (Signed)
Summary: [facility]   Imported By: Maryln Gottron 01/14/2010 12:44:53  _____________________________________________________________________  External Attachment:    Type:   Image     Comment:   External Document

## 2010-12-17 NOTE — Progress Notes (Signed)
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  He went to Dr Margaretha Sheffield but will have no results till Monday and the kidney doctor said it wasn't his kidneys.  He has some darvocet, he takes about one a day and it eases it off.  Would a pulled groin muscle hurt like that.  can Dr [name] work him in today or tomorrow.   Initial call taken by: Roselle Locus,  Apr 05, 2009 10:12 AM  Follow-up for Phone Call        per dr [name]- continue with the dct n and see  if it goes away Follow-up by: Willy Eddy, LPN,  Apr 05, 2009 12:53 PM  Additional Follow-up for Phone Call Additional follow up Details #1::        Pt contacted. Additional Follow-up by: Lynann Beaver CMA,  Apr 05, 2009 1:40 PM

## 2010-12-17 NOTE — Assessment & Plan Note (Signed)
Summary: pain in side/mhf   Vital Signs:  Patient profile:   75 year old male Height:      72 inches Weight:      192 pounds BMI:     26.13 Temp:     98 degrees F oral Pulse rate:   58 / minute Resp:     12 per minute BP sitting:   122 / 70  (left arm)  Primary Care Matelyn Antonelli:  Darryll Capers, MD  CC:  c/o pain in right side for 2 months.  History of Present Illness: took ten days to rifaxin and no response to  the anitbiotis best response to align tenderness in the right lower quadrant  Hypertension History:      Positive major cardiovascular risk factors include male age 62 years old or older, hyperlipidemia, and hypertension.  Negative major cardiovascular risk factors include non-tobacco-user status.     Problems Prior to Update: 1)  Elevated Sedimentation Rate  (ICD-790.1) 2)  Rlq Pain  (ICD-789.03) 3)  Colitis  (ICD-558.9) 4)  Esophageal Stricture  (ICD-530.3) 5)  Gastritis, Chronic  (ICD-535.10) 6)  Colonic Polyps, Hx of  (ICD-V12.72) 7)  Diverticulosis, Colon  (ICD-562.10) 8)  Actinic Keratosis, Head  (ICD-702.0) 9)  Eye Floaters  (ICD-379.24) 10)  Neck Pain  (ICD-723.1) 11)  Ecchymoses, Spontaneous  (ICD-782.7) 12)  Chronic Maxillary Sinusitis  (ICD-473.0) 13)  Back Pain With Radiculopathy  (ICD-729.2) 14)  Hyperlipidemia  (ICD-272.4) 15)  Dermatitis, Atopic  (ICD-691.8) 16)  Irritable Bowel Syndrome  (ICD-564.1) 17)  Neuropathy, Idiopathic Peripheral Nec  (ICD-356.8) 18)  B12 Deficiency  (ICD-266.2) 19)  Kidney Disease  (ICD-593.9) 20)  Allergic Rhinitis  (ICD-477.9) 21)  Hypertension  (ICD-401.9)  Medications Prior to Update: 1)  Cyanocobalamin 1000 Mcg/ml Soln (Cyanocobalamin) .... 1.5 Ml Every 3 Weeks 2)  Diphenoxylate-Atropine 2.5-0.025 Mg Tabs (Diphenoxylate-Atropine) .... As Needed 3)  Nadolol 20 Mg Tabs (Nadolol) .... 1/2 Every Day 4)  Betamethasone Dipropionate Aug 0.05 %  Oint (Aug Betamethasone Dipropionate) .... Apply To Rash 5)  Micardis 40 Mg  Tabs (Telmisartan) .... One By Mouth Daily At Bed Time 6)  Indapamide 1.25 Mg Tabs (Indapamide) .... 1/ 2 Once Daily 7)  Centrum Silver  Tabs (Multiple Vitamins-Minerals) .... One Tablet By Mouth Once Daily 8)  Fish Oil   Oil (Fish Oil) .... One Tablet By Mouth Once Daily 9)  Tylenol Extra Strength 500 Mg Tabs (Acetaminophen) .... As Needed For Pain 10)  Xifaxan 550 Mg .... Take One Tablet By Mouth Two Times A Day 11)  Align   Caps (Misc Intestinal Flora Regulat) .... Take One Capsule By Mouth Daily  Current Medications (verified): 1)  Cyanocobalamin 1000 Mcg/ml Soln (Cyanocobalamin) .... 1.5 Ml Every 3 Weeks 2)  Diphenoxylate-Atropine 2.5-0.025 Mg Tabs (Diphenoxylate-Atropine) .... As Needed 3)  Nadolol 20 Mg Tabs (Nadolol) .... 1/2 Every Day 4)  Betamethasone Dipropionate Aug 0.05 %  Oint (Aug Betamethasone Dipropionate) .... Apply To Rash 5)  Micardis 40 Mg Tabs (Telmisartan) .... One By Mouth Daily At Bed Time 6)  Indapamide 1.25 Mg Tabs (Indapamide) .... 1/ 2 Once Daily 7)  Centrum Silver  Tabs (Multiple Vitamins-Minerals) .... One Tablet By Mouth Once Daily 8)  Fish Oil   Oil (Fish Oil) .... One Tablet By Mouth Once Daily 9)  Tylenol Extra Strength 500 Mg Tabs (Acetaminophen) .... As Needed For Pain 10)  Xifaxan 550 Mg .... Take One Tablet By Mouth Two Times A Day 11)  Align   Caps (  Misc Intestinal Flora Regulat) .... Take One Capsule By Mouth Daily 12)  Propoxyphene N-Apap 100-325 Mg Tabs (Propoxyphene N-Apap) .... One By Mouth Q 4-6 Hours Prn  Allergies (verified): 1)  ! Lipitor (Atorvastatin Calcium) 2)  ! * Statins  ( Crestor) 3)  Prednisone Intensol (Prednisone) 4)  Amoxicillin (Amoxicillin) 5)  Cellcept (Mycophenolate Mofetil) 6)  Cipro  Family History: Reviewed history from 05/01/2009 and no changes required. n/c No FH of Colon Cancer:  Social History: Reviewed history from 05/01/2009 and no changes required. Retired Married Former Smoker Alcohol Use - no Illicit  Drug Use - no Patient does not get regular exercise.  Daily Caffeine Use: occ  Review of Systems  The patient denies anorexia, fever, weight loss, weight gain, vision loss, decreased hearing, hoarseness, chest pain, syncope, dyspnea on exertion, peripheral edema, prolonged cough, headaches, hemoptysis, abdominal pain, melena, hematochezia, severe indigestion/heartburn, hematuria, incontinence, genital sores, muscle weakness, suspicious skin lesions, transient blindness, difficulty walking, depression, unusual weight change, abnormal bleeding, enlarged lymph nodes, angioedema, breast masses, and testicular masses.    Physical Exam  General:  Well developed, well nourished, no acute distress. Head:  Normocephalic and atraumatic. Eyes:  PERRLA, no icterus.exam deferred to patient's ophthalmologist.   Ears:  R ear normal and L ear normal.   Nose:  nasal dischargemucosal pallor, mucosal erythema, and mucosal edema.   Mouth:  pharyngeal exudate and posterior lymphoid hypertrophy.   Neck:  Supple; no masses or thyromegaly. Chest Wall:  No deformities, masses, tenderness or gynecomastia noted. Lungs:  Clear throughout to auscultation. Heart:  Regular rate and rhythm; no murmurs, rubs,  or bruits. Abdomen:  Soft, nontender and nondistended. No masses, hepatosplenomegaly or hernias noted. Normal bowel sounds.I cannot appreciate any inguinal hernias or other abdominal wall abnormalities. Msk:  no joint swelling and no joint warmth.   Pulses:  decreased in the lower extremity plus one Extremities:  No clubbing, cyanosis, edema or deformities noted. Neurologic:  Alert and  oriented x4;  grossly normal neurologically.   Impression & Recommendations:  Problem # 1:  ABDOMINAL PAIN OTHER SPECIFIED SITE (ICD-789.09) Assessment Unchanged  His updated medication list for this problem includes:    Tylenol Extra Strength 500 Mg Tabs (Acetaminophen) .[name] Kitchen... As needed for pain    Propoxyphene N-apap 100-325  Mg Tabs (Propoxyphene n-apap) ..... One by mouth q 4-6 hours prn  Discussed use of medications, application of heat or cold, and exercises.   Orders: Durable Medical Equipment (DME)  Problem # 2:  RLQ PAIN (ICD-789.03) Assessment: Unchanged  Orders: Durable Medical Equipment (DME)  Discussed symptom control with the patient.   Problem # 3:  BACK PAIN WITH RADICULOPATHY (ICD-729.2)  Complete Medication List: 1)  Cyanocobalamin 1000 Mcg/ml Soln (Cyanocobalamin) .... 1.5 ml every 3 weeks 2)  Diphenoxylate-atropine 2.5-0.025 Mg Tabs (Diphenoxylate-atropine) .... As needed 3)  Nadolol 20 Mg Tabs (Nadolol) .... 1/2 every day 4)  Betamethasone Dipropionate Aug 0.05 % Oint (Aug betamethasone dipropionate) .... Apply to rash 5)  Micardis 40 Mg Tabs (Telmisartan) .... One by mouth daily at bed time 6)  Indapamide 1.25 Mg Tabs (Indapamide) .... 1/ 2 once daily 7)  Centrum Silver Tabs (Multiple vitamins-minerals) .... One tablet by mouth once daily 8)  Fish Oil Oil (Fish oil) .... One tablet by mouth once daily 9)  Tylenol Extra Strength 500 Mg Tabs (Acetaminophen) .... As needed for pain 10)  Xifaxan 550 Mg  .... Take one tablet by mouth two times a day 11)  Align Caps (  Misc intestinal flora regulat) .... Take one capsule by mouth daily 12)  Propoxyphene N-apap 100-325 Mg Tabs (Propoxyphene n-apap) .... One by mouth q 4-6 hours prn  Hypertension Assessment/Plan:      The patient's hypertensive risk group is category B: At least one risk factor (excluding diabetes) with no target organ damage.  Today's blood pressure is 122/70.  His blood pressure goal is < 125/75.  Patient Instructions: 1)  Please schedule a follow-up appointment as needed. Prescriptions: PROPOXYPHENE N-APAP 100-325 MG TABS (PROPOXYPHENE N-APAP) one by mouth q 4-6 hours prn  #60 x 11   Entered and Authorized by:   [name] MD   Signed by:   [name] MD on 05/15/2009   Method used:   Print then Give to  Patient   RxID:   [id number]

## 2010-12-17 NOTE — Progress Notes (Signed)
Summary: something for cough/congestion  Phone Note Call from Patient Call back at Home Phone [phone]   Caller: Patient Call For: [name] MD Summary of Call: pt has head congestion/nasal drainage and cough, pt was given bpm med he had a hard time urinating/ [facility] [phone] Initial call taken by: Heron Sabins,  January 14, 2010 12:29 PM  Follow-up for Phone Call        may have tussionex6 oz 1 tsp every 12 hours, but if it is too exspensive he can try delsym otc  Follow-up by: Willy Eddy, LPN,  January 14, 2010 4:37 PM    New/Updated Medications: Sandria Senter ER 8-10 MG/5ML LQCR (CHLORPHENIRAMINE-HYDROCODONE) one teaspoon q 12 hours as needed cough Prescriptions: TUSSIONEX PENNKINETIC ER 8-10 MG/5ML LQCR (CHLORPHENIRAMINE-HYDROCODONE) one teaspoon q 12 hours as needed cough  #6 oz. x 0   Entered by:   Lynann Beaver CMA   Authorized by:   [name] MD   Signed by:   Lynann Beaver CMA on 01/14/2010   Method used:   Telephoned to ...       [facility] [address] [id number]* (retail)       [address] [place], [place]  [address]       Ph: [phone]       Fax: [phone]   RxID:   [id number]

## 2010-12-17 NOTE — Progress Notes (Signed)
Summary: Pt shows no refills remaining on Losartan 50mg   Phone Note Call from Patient Call back at Home Phone [phone]   Caller: Patient Summary of Call: Pt called [facility] re: Losartan 50mg   and was told that there is no refills remaining and it shows 3 refills remaing im emr. [facility] [phone].  Initial call taken by: Lucy Antigua,  October 19, 2009 9:27 AM    Prescriptions: LOSARTAN POTASSIUM 50 MG TABS (LOSARTAN POTASSIUM) one by mouth daily  #90 x 3   Entered by:   Willy Eddy, LPN   Authorized by:   [name] MD   Signed by:   Willy Eddy, LPN on 95/62/1308   Method used:   Electronically to        [facility]* (mail-order)             ,          Ph: [phone]       Fax: [phone]   RxID:   [id number]

## 2010-12-17 NOTE — Assessment & Plan Note (Signed)
Summary: flu like symptoms/cjr   Vital Signs:  Patient profile:   75 year old male Weight:      188 pounds BMI:     25.59 Temp:     97.5 degrees F oral Pulse rate:   64 / minute Pulse rhythm:   regular BP sitting:   150 / 98  (left arm) Cuff size:   regular  Vitals Entered By: Raechel Ache, RN (January 21, 2010 9:38 AM) CC: C/o vomiting, diarrhea which started Thursday night and had over weekend; better now but has head congestion and tongue is white and weak.   History of Present Illness: Here to follow up on 3 days of vomitting and diarhhea over the weekend. he seems to be over this now, although he is still a bit weak. Drinking fluids. Kept toast down easily this am. No fever or abdominal pains.   Allergies: 1)  ! Lipitor (Atorvastatin Calcium) 2)  ! * Statins  ( Crestor) 3)  Prednisone Intensol (Prednisone) 4)  Amoxicillin (Amoxicillin) 5)  Cellcept (Mycophenolate Mofetil) 6)  Cipro  Past History:  Past Medical History: Reviewed history from 05/01/2009 and no changes required. Current Problems:  RLQ PAIN (ICD-789.03) COLITIS (ICD-558.9) ESOPHAGEAL STRICTURE (ICD-530.3) GASTRITIS, CHRONIC (ICD-535.10) COLONIC POLYPS, HX OF (ICD-V12.72) DIVERTICULOSIS, COLON (ICD-562.10) ACTINIC KERATOSIS, HEAD (ICD-702.0) EYE FLOATERS (ICD-379.24) NECK PAIN (ICD-723.1) ESOPHAGEAL REFLUX (ICD-530.81) ECCHYMOSES, SPONTANEOUS (ICD-782.7) CHRONIC MAXILLARY SINUSITIS (ICD-473.0) BACK PAIN WITH RADICULOPATHY (ICD-729.2) HYPERLIPIDEMIA (ICD-272.4) R/O MULTIPLE  MYELOMA (ICD-203.00) DERMATITIS, ATOPIC (ICD-691.8) IRRITABLE BOWEL SYNDROME (ICD-564.1) NEUROPATHY, IDIOPATHIC PERIPHERAL NEC (ICD-356.8) B12 DEFICIENCY (ICD-266.2) KIDNEY DISEASE (ICD-593.9) ALLERGIC RHINITIS (ICD-477.9) HYPERTENSION (ICD-401.9)  Past Surgical History: Reviewed history from 08/11/2007 and no changes required. Inguinal herniorrhaphy Lumbar laminectomy Lumbar fusion Tonsillectomy  Review of  Systems  The patient denies anorexia, fever, weight loss, weight gain, vision loss, decreased hearing, hoarseness, chest pain, syncope, dyspnea on exertion, peripheral edema, prolonged cough, headaches, hemoptysis, abdominal pain, melena, hematochezia, severe indigestion/heartburn, hematuria, incontinence, genital sores, muscle weakness, suspicious skin lesions, transient blindness, difficulty walking, depression, unusual weight change, abnormal bleeding, enlarged lymph nodes, angioedema, breast masses, and testicular masses.    Physical Exam  General:  Well-developed,well-nourished,in no acute distress; alert,appropriate and cooperative throughout examination Lungs:  Normal respiratory effort, chest expands symmetrically. Lungs are clear to auscultation, no crackles or wheezes. Heart:  Normal rate and regular rhythm. S1 and S2 normal without gallop, murmur, click, rub or other extra sounds. Abdomen:  Bowel sounds positive,abdomen soft and non-tender without masses, organomegaly or hernias noted.   Impression & Recommendations:  Problem # 1:  GASTROENTERITIS, VIRAL (ICD-008.8)  Complete Medication List: 1)  Cyanocobalamin 1000 Mcg/ml Soln (Cyanocobalamin) .... 1.5 ml every 3 weeks 2)  Diphenoxylate-atropine 2.5-0.025 Mg Tabs (Diphenoxylate-atropine) .... As needed 3)  Nadolol 20 Mg Tabs (Nadolol) .... 1/2 every day 4)  Losartan Potassium 50 Mg Tabs (Losartan potassium) .... One by mouth daily 5)  Indapamide 1.25 Mg Tabs (Indapamide) .... 1/ 2 once daily 6)  Centrum Silver Tabs (Multiple vitamins-minerals) .... One tablet by mouth once daily 7)  Fish Oil Oil (Fish oil) .... One tablet by mouth once daily 8)  Tylenol Extra Strength 500 Mg Tabs (Acetaminophen) .... As needed for pain 9)  Xifaxan 550 Mg  .... Take one tablet by mouth two times a day 10)  Phillips Colon Health Caps (Probiotic product) .[name] Kitchen.. 1 by mouth qd 11)  Propoxyphene N-apap 100-325 Mg Tabs (Propoxyphene n-apap) .... One by  mouth q 4-6 hours prn 12)  Clindamycin  Phos-benzoyl Perox 1-5 % Gel (Clindamycin phos-benzoyl perox) .... Apply to face daily after washing 13)  Astepro 0.15 % Soln (Azelastine hcl) .... Two spray in each nostril daily 14)  [facility] Allergy Relief 10 Mg Tabs (Loratadine) .... One tablet by mouth once daily 15)  Lodrane 12 Hour 6 Mg Xr12h-tab (Brompheniramine maleate) .... One by mouth bid 16)  Tussionex Pennkinetic Er 8-10 Mg/2ml Lqcr (Chlorpheniramine-hydrocodone) .... One teaspoon q 12 hours as needed cough 17)  Promethazine Hcl 25 Mg Tabs (Promethazine hcl) .... One by mouth every 6 hrs nausea  Other Orders: Vit B12 1000 mcg (J3420) Admin of Therapeutic Inj  intramuscular or subcutaneous (52841)  Patient Instructions: 1)  Hde appears to be over the hunmp and recovering nicely. Advised him to eat a bland diet for several more days.  2)  Please schedule a follow-up appointment as needed .    Medication Administration  Injection # 1:    Medication: Vit B12 1000 mcg    Diagnosis: B12 DEFICIENCY (ICD-266.2)    Route: IM    Site: R deltoid    Exp Date: 07/2011    Lot #: [id number]    Mfr: American Regent    Patient tolerated injection without complications    Given by: Raechel Ache, RN (January 21, 2010 9:41 AM)  Orders Added: 1)  Vit B12 1000 mcg [J3420] 2)  Admin of Therapeutic Inj  intramuscular or subcutaneous [96372] 3)  Est. Patient Level IV [01027]

## 2010-12-17 NOTE — Progress Notes (Signed)
Summary: needs to be worked in with [employer] [facility] Note Call from Patient Call back at [employer] [phone]   Caller: pt live Call For: [name] Summary of Call: Needs to see Dr [name] about pain between shoulder blades,nausea.   Initial call taken by: Roselle Locus,  Mar 27, 2008 12:05 PM  Follow-up for Phone Call        scheduled an appt tomorrow with Dr [name] for back pain and to get his B12 injection. Follow-up by: Lynann Beaver CMA,  Mar 27, 2008 2:17 PM

## 2010-12-17 NOTE — Assessment & Plan Note (Signed)
Summary: 3 month rov/njr   Vital Signs:  Patient profile:   75 year old male Height:      72 inches Weight:      191 pounds BMI:     26.00 Temp:     98.2 degrees F oral Pulse rate:   76 / minute Resp:     14 per minute BP sitting:   136 / 80  (left arm)  Vitals Entered By: Willy Eddy, LPN (June 27, 2009 9:25 AM)  Primary Care Provider:  Darryll Capers, MD  CC:  ROA.  History of Present Illness:  Follow-Up Visit      This is a 75 year old man who presents for Follow-up visit.  persistant drainage in through rosacia.  The patient denies chest pain, palpitations, dizziness, syncope, low blood sugar symptoms, high blood sugar symptoms, edema, SOB, DOE, PND, and orthopnea.  Since the last visit the patient notes no new problems or concerns.  The patient reports taking meds as prescribed and monitoring BP.  When questioned about possible medication side effects, the patient notes none and GI upset.    Problems Prior to Update: 1)  Chest Wall Pain, Acute  (ICD-786.52) 2)  Abdominal Pain Other Specified Site  (ICD-789.09) 3)  Elevated Sedimentation Rate  (ICD-790.1) 4)  Rlq Pain  (ICD-789.03) 5)  Colitis  (ICD-558.9) 6)  Esophageal Stricture  (ICD-530.3) 7)  Gastritis, Chronic  (ICD-535.10) 8)  Colonic Polyps, Hx of  (ICD-V12.72) 9)  Diverticulosis, Colon  (ICD-562.10) 10)  Actinic Keratosis, Head  (ICD-702.0) 11)  Eye Floaters  (ICD-379.24) 12)  Neck Pain  (ICD-723.1) 13)  Ecchymoses, Spontaneous  (ICD-782.7) 14)  Chronic Maxillary Sinusitis  (ICD-473.0) 15)  Back Pain With Radiculopathy  (ICD-729.2) 16)  Hyperlipidemia  (ICD-272.4) 17)  Dermatitis, Atopic  (ICD-691.8) 18)  Irritable Bowel Syndrome  (ICD-564.1) 19)  Neuropathy, Idiopathic Peripheral Nec  (ICD-356.8) 20)  B12 Deficiency  (ICD-266.2) 21)  Kidney Disease  (ICD-593.9) 22)  Allergic Rhinitis  (ICD-477.9) 23)  Hypertension  (ICD-401.9)  Medications Prior to Update: 1)  Cyanocobalamin 1000 Mcg/ml Soln  (Cyanocobalamin) .... 1.5 Ml Every 3 Weeks 2)  Diphenoxylate-Atropine 2.5-0.025 Mg Tabs (Diphenoxylate-Atropine) .... As Needed 3)  Nadolol 20 Mg Tabs (Nadolol) .... 1/2 Every Day 4)  Betamethasone Dipropionate Aug 0.05 %  Oint (Aug Betamethasone Dipropionate) .... Apply To Rash 5)  Micardis 40 Mg Tabs (Telmisartan) .... One By Mouth Daily At Bed Time 6)  Indapamide 1.25 Mg Tabs (Indapamide) .... 1/ 2 Once Daily 7)  Centrum Silver  Tabs (Multiple Vitamins-Minerals) .... One Tablet By Mouth Once Daily 8)  Fish Oil   Oil (Fish Oil) .... One Tablet By Mouth Once Daily 9)  Tylenol Extra Strength 500 Mg Tabs (Acetaminophen) .... As Needed For Pain 10)  Xifaxan 550 Mg .... Take One Tablet By Mouth Two Times A Day 11)  Align   Caps (Misc Intestinal Flora Regulat) .... Take One Capsule By Mouth Daily 12)  Propoxyphene N-Apap 100-325 Mg Tabs (Propoxyphene N-Apap) .... One By Mouth Q 4-6 Hours Prn  Current Medications (verified): 1)  Cyanocobalamin 1000 Mcg/ml Soln (Cyanocobalamin) .... 1.5 Ml Every 3 Weeks 2)  Diphenoxylate-Atropine 2.5-0.025 Mg Tabs (Diphenoxylate-Atropine) .... As Needed 3)  Nadolol 20 Mg Tabs (Nadolol) .... 1/2 Every Day 4)  Micardis 40 Mg Tabs (Telmisartan) .... One By Mouth Daily At Bed Time 5)  Indapamide 1.25 Mg Tabs (Indapamide) .... 1/ 2 Once Daily 6)  Centrum Silver  Tabs (Multiple Vitamins-Minerals) .... One  Tablet By Mouth Once Daily 7)  Fish Oil   Oil (Fish Oil) .... One Tablet By Mouth Once Daily 8)  Tylenol Extra Strength 500 Mg Tabs (Acetaminophen) .... As Needed For Pain 9)  Xifaxan 550 Mg .... Take One Tablet By Mouth Two Times A Day 10)  Align   Caps (Misc Intestinal Flora Regulat) .... Take One Capsule By Mouth Daily 11)  Propoxyphene N-Apap 100-325 Mg Tabs (Propoxyphene N-Apap) .... One By Mouth Q 4-6 Hours Prn  Allergies (verified): 1)  ! Lipitor (Atorvastatin Calcium) 2)  ! * Statins  ( Crestor) 3)  Prednisone Intensol (Prednisone) 4)  Amoxicillin  (Amoxicillin) 5)  Cellcept (Mycophenolate Mofetil) 6)  Cipro  Past History:  Family History: Last updated: 05/01/2009 n/c No FH of Colon Cancer:  Social History: Last updated: 05/01/2009 Retired Married Former Smoker Alcohol Use - no Illicit Drug Use - no Patient does not get regular exercise.  Daily Caffeine Use: occ  Risk Factors: Exercise: no (05/01/2009)  Risk Factors: Smoking Status: quit (05/05/2008)  Past medical, surgical, family and social histories (including risk factors) reviewed, and no changes noted (except as noted below).  Past Medical History: Reviewed history from 05/01/2009 and no changes required. Current Problems:  RLQ PAIN (ICD-789.03) COLITIS (ICD-558.9) ESOPHAGEAL STRICTURE (ICD-530.3) GASTRITIS, CHRONIC (ICD-535.10) COLONIC POLYPS, HX OF (ICD-V12.72) DIVERTICULOSIS, COLON (ICD-562.10) ACTINIC KERATOSIS, HEAD (ICD-702.0) EYE FLOATERS (ICD-379.24) NECK PAIN (ICD-723.1) ESOPHAGEAL REFLUX (ICD-530.81) ECCHYMOSES, SPONTANEOUS (ICD-782.7) CHRONIC MAXILLARY SINUSITIS (ICD-473.0) BACK PAIN WITH RADICULOPATHY (ICD-729.2) HYPERLIPIDEMIA (ICD-272.4) R/O MULTIPLE  MYELOMA (ICD-203.00) DERMATITIS, ATOPIC (ICD-691.8) IRRITABLE BOWEL SYNDROME (ICD-564.1) NEUROPATHY, IDIOPATHIC PERIPHERAL NEC (ICD-356.8) B12 DEFICIENCY (ICD-266.2) KIDNEY DISEASE (ICD-593.9) ALLERGIC RHINITIS (ICD-477.9) HYPERTENSION (ICD-401.9)  Past Surgical History: Reviewed history from 08/11/2007 and no changes required. Inguinal herniorrhaphy Lumbar laminectomy Lumbar fusion Tonsillectomy  Family History: Reviewed history from 05/01/2009 and no changes required. n/c No FH of Colon Cancer:  Social History: Reviewed history from 05/01/2009 and no changes required. Retired Married Former Smoker Alcohol Use - no Illicit Drug Use - no Patient does not get regular exercise.  Daily Caffeine Use: occ  Review of Systems  The patient denies anorexia, fever, weight  loss, weight gain, vision loss, decreased hearing, hoarseness, chest pain, syncope, dyspnea on exertion, peripheral edema, prolonged cough, headaches, hemoptysis, abdominal pain, melena, hematochezia, severe indigestion/heartburn, hematuria, incontinence, genital sores, muscle weakness, suspicious skin lesions, transient blindness, difficulty walking, depression, unusual weight change, abnormal bleeding, enlarged lymph nodes, angioedema, breast masses, and testicular masses.    Physical Exam  General:  Well-developed,well-nourished,in no acute distress; alert,appropriate and cooperative throughout examination Head:  Normocephalic and atraumatic. Eyes:  PERRLA, no icterus.exam deferred to patient's ophthalmologist.   Ears:  R ear normal and L ear normal.   Nose:  nasal dischargemucosal pallor, mucosal erythema, and mucosal edema.   Mouth:  pharyngeal exudate and posterior lymphoid hypertrophy.   Neck:  No deformities, masses, or tenderness noted. Lungs:  Normal respiratory effort, chest expands symmetrically. Lungs are clear to auscultation, no crackles or wheezes. Heart:  Normal rate and regular rhythm. S1 and S2 normal without gallop, murmur, click, rub or other extra sounds. Abdomen:  Soft, nontender and nondistended. No masses, hepatosplenomegaly or hernias noted. Normal bowel sounds.I cannot appreciate any inguinal hernias or other abdominal wall abnormalities. Msk:  no joint swelling and no joint warmth.     Impression & Recommendations:  Problem # 1:  ACNE ROSACEA (ICD-695.3) CLINDAMYCIN BEROXIDE GEL TRIAL  Problem # 2:  ABDOMINAL PAIN OTHER SPECIFIED SITE (ICD-789.09) Assessment: Unchanged  His updated medication list for this problem includes:    Tylenol Extra Strength 500 Mg Tabs (Acetaminophen) .[name] Kitchen... As needed for pain    Propoxyphene N-apap 100-325 Mg Tabs (Propoxyphene n-apap) ..... One by mouth q 4-6 hours prn  Discussed use of medications, application of heat or cold, and  exercises.   Problem # 3:  ESOPHAGEAL STRICTURE (ICD-530.3) SWALLOWING GOOD  Problem # 4:  CHRONIC MAXILLARY SINUSITIS (ICD-473.0) Assessment: Unchanged  CHRONIC DRAINGAGE ASTELIN TRIAL Take antibiotics for full duration. Discussed treatment options including indications for coronal CT scan of sinuses and ENT referral.   His updated medication list for this problem includes:    Astepro 0.15 % Soln (Azelastine hcl) .[name] Kitchen..[name] Kitchen Two spray in each nostril daily  Problem # 5:  B12 DEFICIENCY (ICD-266.2) MONITERING Orders: Vit B12 1000 mcg (J3420) Admin of Therapeutic Inj  intramuscular or subcutaneous (02725)  Complete Medication List: 1)  Cyanocobalamin 1000 Mcg/ml Soln (Cyanocobalamin) .... 1.5 ml every 3 weeks 2)  Diphenoxylate-atropine 2.5-0.025 Mg Tabs (Diphenoxylate-atropine) .... As needed 3)  Nadolol 20 Mg Tabs (Nadolol) .... 1/2 every day 4)  Micardis 40 Mg Tabs (Telmisartan) .... One by mouth daily at bed time 5)  Indapamide 1.25 Mg Tabs (Indapamide) .... 1/ 2 once daily 6)  Centrum Silver Tabs (Multiple vitamins-minerals) .... One tablet by mouth once daily 7)  Fish Oil Oil (Fish oil) .... One tablet by mouth once daily 8)  Tylenol Extra Strength 500 Mg Tabs (Acetaminophen) .... As needed for pain 9)  Xifaxan 550 Mg  .... Take one tablet by mouth two times a day 10)  Align Caps (Misc intestinal flora regulat) .... Take one capsule by mouth daily 11)  Propoxyphene N-apap 100-325 Mg Tabs (Propoxyphene n-apap) .... One by mouth q 4-6 hours prn 12)  Clindamycin Phos-benzoyl Perox 1-5 % Gel (Clindamycin phos-benzoyl perox) .... Apply to face daily after washing 13)  Astepro 0.15 % Soln (Azelastine hcl) .... Two spray in each nostril daily  Patient Instructions: 1)  Please schedule a follow-up appointment in 3 months. Prescriptions: CLINDAMYCIN PHOS-BENZOYL PEROX 1-5 % GEL (CLINDAMYCIN PHOS-BENZOYL PEROX) apply to face daily after washing  #60gm x 11   Entered and Authorized by:    [name] MD   Signed by:   [name] MD on 06/27/2009   Method used:   Electronically to        [facility] [address] #[id number]* (retail)       [address] [place], [place]  [address]       Ph: [phone]       Fax: [phone]   RxID:   [id number]    Medication Administration  Injection # 1:    Medication: Vit B12 1000 mcg    Diagnosis: B12 DEFICIENCY (ICD-266.2)    Route: IM    Site: R deltoid    Exp Date: 05/16/2010    Lot #: [id number]    Mfr: American Regent    Comments: 1.03/1499 MCG GIVEN    Patient tolerated injection without complications    Given by: Willy Eddy, LPN (June 27, 2009 9:27 AM)  Orders Added: 1)  Vit B12 1000 mcg [J3420] 2)  Admin of Therapeutic Inj  intramuscular or subcutaneous [96372] 3)  Est. Patient Level IV [55732]

## 2010-12-17 NOTE — Assessment & Plan Note (Signed)
Summary: b12 with bonnye//ccm   Nurse Visit     Allergies: 1)  ! Lipitor (Atorvastatin Calcium) 2)  ! * Statins  ( Crestor) 3)  Prednisone Intensol (Prednisone) 4)  Amoxicillin (Amoxicillin) 5)  Cellcept (Mycophenolate Mofetil) 6)  Cipro     Medication Administration  Injection # 1:    Medication: Vit B12 1000 mcg    Diagnosis: B12 DEFICIENCY (ICD-266.2)    Route: IM    Site: L deltoid    Exp Date: 07/10/2010    Lot #: [id number]    Mfr: American Regent    Patient tolerated injection without complications    Given by: Willy Eddy, LPN (March 12, 2009 4:38 PM)  Orders Added: 1)  Admin of Therapeutic Inj  intramuscular or subcutaneous [96372] 2)  Vit B12 1000 mcg [J3420]

## 2010-12-17 NOTE — Assessment & Plan Note (Signed)
Summary: B12 SHOT/JLS   Nurse Visit    Prior Medications: CYANOCOBALAMIN 1000 MCG/ML SOLN (CYANOCOBALAMIN) 1.5 ml every 3 weeks DIPHENOXYLATE-ATROPINE 2.5-0.025 MG TABS (DIPHENOXYLATE-ATROPINE) as needed LONGS ONE DAILY MULTI VITAMIN  TABS (MULTIPLE VITAMIN) Take 1 tablet by mouth once a day NADOLOL 20 MG TABS (NADOLOL) 1/2 every day BETAMETHASONE DIPROPIONATE AUG 0.05 %  OINT (AUG BETAMETHASONE DIPROPIONATE) apply to rash MICARDIS 40 MG TABS (TELMISARTAN) one by mouth daily at bed time INDAPAMIDE 1.25 MG TABS (INDAPAMIDE) 1/ 2 once daily Current Allergies: ! LIPITOR (ATORVASTATIN CALCIUM) ! * STATINS  ( CRESTOR) PREDNISONE INTENSOL (PREDNISONE) AMOXICILLIN (AMOXICILLIN) CELLCEPT (MYCOPHENOLATE MOFETIL) CIPRO    Medication Administration  Injection # 1:    Medication: Vit B12 1000 mcg    Diagnosis: B12 DEFICIENCY (ICD-266.2)    Route: IM    Site: L deltoid    Exp Date: 06/17/2010    Lot #: [id number]    Mfr: American Regent    Patient tolerated injection without complications    Given by: Willy Eddy, LPN (December 18, 2008 12:28 PM)  Orders Added: 1)  Admin of Therapeutic Inj  intramuscular or subcutaneous [email or web address].Putnam    ]

## 2010-12-17 NOTE — Progress Notes (Signed)
Summary: was he supposed to take one or two of the samples med  Phone Note Call from Patient Call back at Home Phone [phone]   Caller: pt live Call For: [name] Summary of Call: His blood pressure is up since he came off the blood pressure med.  146/98.  Jenkins gave samples of atacand 4 mg.  Was he supposed to take 1 per day or 2 per day.   Initial call taken by: Roselle Locus,  August 24, 2008 11:12 AM  Follow-up for Phone Call        if the blood pressure was up he was to increase to two or 8 mg daily Follow-up by: [name] MD,  August 25, 2008 8:05 AM  Additional Follow-up for Phone Call Additional follow up Details #1::        pt informed Additional Follow-up by: Willy Eddy, LPN,  August 25, 2008 8:16 AM

## 2010-12-17 NOTE — Assessment & Plan Note (Signed)
Summary: b12 inj/njr   Nurse Visit   Allergies: 1)  ! Lipitor (Atorvastatin Calcium) 2)  ! * Statins  ( Crestor) 3)  Prednisone Intensol (Prednisone) 4)  Amoxicillin (Amoxicillin) 5)  Cellcept (Mycophenolate Mofetil) 6)  Cipro  Medication Administration  Injection # 1:    Medication: Vit B12 1000 mcg    Diagnosis: B12 DEFICIENCY (ICD-266.2)    Route: IM    Site: L deltoid    Exp Date: 07/17/2010    Lot #: [id number]    Mfr: American Regent    Comments: 1.21ml/1500mcg given     Patient tolerated injection without complications    Given by: Willy Eddy, LPN (November 06, 2009 12:04 PM)  Orders Added: 1)  Vit B12 1000 mcg [J3420] 2)  Admin of Therapeutic Inj  intramuscular or subcutaneous [96045]

## 2010-12-17 NOTE — Letter (Signed)
Summary: [facility]  [facility]   Imported By: Maryln Gottron 09/05/2010 13:47:17  _____________________________________________________________________  External Attachment:    Type:   Image     Comment:   External Document

## 2010-12-17 NOTE — Letter (Signed)
Summary: [facility] Instructions  [facility]  [address] [place], [place] [address]   Phone: [phone]  Fax: [phone]       Emmaus Mcpartlin    1936-04-13    MRN: [id number]        Procedure Day /Date: 11/28/09, Wednesday     Arrival Time: 1:00     Procedure Time: 2:00     Location of Procedure:                    [facility] (4th Floor)                         PREPARATION FOR COLONOSCOPY WITH MOVIPREP   Starting 5 days prior to your procedure 11/23/09 do not eat nuts, seeds, popcorn, corn, beans, peas,  salads, or any raw vegetables.  Do not take any fiber supplements (e.g. Metamucil, Citrucel, and Benefiber).  THE DAY BEFORE YOUR PROCEDURE         DATE: 11/27/09  DAY: Tuesday  1.  Drink clear liquids the entire day-NO SOLID FOOD  2.  Do not drink anything colored red or purple.  Avoid juices with pulp.  No orange juice.  3.  Drink at least 64 oz. (8 glasses) of fluid/clear liquids during the day to prevent dehydration and help the prep work efficiently.  CLEAR LIQUIDS INCLUDE: Water Jello Ice Popsicles Tea (sugar ok, no milk/cream) Powdered fruit flavored drinks Coffee (sugar ok, no milk/cream) Gatorade Juice: apple, white grape, white cranberry  Lemonade Clear bullion, consomm, broth Carbonated beverages (any kind) Strained chicken noodle soup Hard Candy                             4.  In the morning, mix first dose of MoviPrep solution:    Empty 1 Pouch A and 1 Pouch B into the disposable container    Add lukewarm drinking water to the top line of the container. Mix to dissolve    Refrigerate (mixed solution should be used within 24 hrs)  5.  Begin drinking the prep at 5:00 p.m. The MoviPrep container is divided by 4 marks.   Every 15 minutes drink the solution down to the next mark (approximately 8 oz) until the full liter is complete.   6.  Follow completed prep with 16 oz of clear liquid of your choice (Nothing  red or purple).  Continue to drink clear liquids until bedtime.  7.  Before going to bed, mix second dose of MoviPrep solution:    Empty 1 Pouch A and 1 Pouch B into the disposable container    Add lukewarm drinking water to the top line of the container. Mix to dissolve    Refrigerate  THE DAY OF YOUR PROCEDURE      DATE: 11/28/09  DAY: Wednesday  Beginning at 9:00 a.m. (5 hours before procedure):         1. Every 15 minutes, drink the solution down to the next mark (approx 8 oz) until the full liter is complete.  2. Follow completed prep with 16 oz. of clear liquid of your choice.    3. You may drink clear liquids until 12:00  (2 HOURS BEFORE PROCEDURE).   MEDICATION INSTRUCTIONS  Unless otherwise instructed, you should take regular prescription medications with a small sip of water   as early as possible the morning of  your procedure.  Diabetic patients - see separate instructions.             OTHER INSTRUCTIONS  You will need a responsible adult at least 73 years of age to accompany you and drive you home.   This person must remain in the waiting room during your procedure.  Wear loose fitting clothing that is easily removed.  Leave jewelry and other valuables at home.  However, you may wish to bring a book to read or  an iPod/MP3 player to listen to music as you wait for your procedure to start.  Remove all body piercing jewelry and leave at home.  Total time from sign-in until discharge is approximately 2-3 hours.  You should go home directly after your procedure and rest.  You can resume normal activities the  day after your procedure.  The day of your procedure you should not:   Drive   Make legal decisions   Operate machinery   Drink alcohol   Return to work  You will receive specific instructions about eating, activities and medications before you leave.    The above instructions have been reviewed and explained to me by    _______________________    I fully understand and can verbalize these instructions _____________________________ Date _________

## 2010-12-17 NOTE — Progress Notes (Signed)
Summary: red yeast rice  Phone Note Call from Patient Call back at Home Phone [phone]   Caller: vm Summary of Call: Red yeast rice is 600mg .  Bottle says take 2 two times a day.  Is that too much or what? Initial call taken by: Rudy Jew, RN,  February 19, 2010 3:56 PM  Follow-up for Phone Call        that is perfect, Follow-up by: Willy Eddy, LPN,  February 19, 2010 4:08 PM  Additional Follow-up for Phone Call Additional follow up Details #1::        Phone Call Completed Additional Follow-up by: Rudy Jew, RN,  February 19, 2010 4:11 PM    New/Updated Medications: RED YEAST RICE 600 MG TABS (RED YEAST RICE EXTRACT)

## 2010-12-17 NOTE — Progress Notes (Signed)
Summary: head and chest congestion  Phone Note Call from Patient   Caller: Patient Call For: [name] MD Summary of Call: Pt is having head and chest congestion and would like a Rx called to [facility] ([address] [facility] [phone] Initial call taken by: Lynann Beaver CMA,  December 14, 2009 9:27 AM  Follow-up for Phone Call        per dr [name]- may have bromfed dm 8 oz-2 tsp every 6 hours Follow-up by: Willy Eddy, LPN,  December 14, 2009 9:40 AM    New/Updated Medications: BROMFED DM 30-2-10 MG/5ML SYRP (PSEUDOEPH-BROMPHEN-DM) 2 teaspoons q 6 hours as needed Prescriptions: BROMFED DM 30-2-10 MG/5ML SYRP (PSEUDOEPH-BROMPHEN-DM) 2 teaspoons q 6 hours as needed  #6 oz x 0   Entered by:   Lynann Beaver CMA   Authorized by:   [name] MD   Signed by:   Lynann Beaver CMA on 12/14/2009   Method used:   Electronically to        [facility] [address] #[id number]* (retail)       [address] [place], [place]  [address]       Ph: [phone]       Fax: [phone]   RxID:   [id number]  pt notified.

## 2010-12-17 NOTE — Progress Notes (Signed)
Summary: URI  Phone Note Call from Patient   Caller: Patient Call For: [name] MD Summary of Call: Pt is asking to be seen for ongoing URI. [phone]  See previous phone note. Initial call taken by: Lynann Beaver CMA,  July 09, 2009 10:17 AM  Follow-up for Phone Call        zpack and allegra d 1 by mouth two times a day for 10 days per dr [name] Follow-up by: Willy Eddy, LPN,  July 09, 2009 10:32 AM  Additional Follow-up for Phone Call Additional follow up Details #1::        Patient informed. Additional Follow-up by: Darra Lis RMA,  July 09, 2009 10:48 AM    New/Updated Medications: ZITHROMAX Z-PAK 250 MG TABS (AZITHROMYCIN) use as directed. ALLEGRA-D 12 HOUR 60-120 MG XR12H-TAB (FEXOFENADINE-PSEUDOEPHEDRINE) one tablet two time a day x 10 days. Prescriptions: ALLEGRA-D 12 HOUR 60-120 MG XR12H-TAB (FEXOFENADINE-PSEUDOEPHEDRINE) one tablet two time a day x 10 days.  #20 x 0   Entered by:   Darra Lis RMA   Authorized by:   [name] MD   Signed by:   Darra Lis RMA on 07/09/2009   Method used:   Electronically to        [facility] [address] [id number]* (retail)       [address] [place], [place]  [address]       Ph: [phone]       Fax: [phone]   RxID:   [id number] ZITHROMAX Z-PAK 250 MG TABS (AZITHROMYCIN) use as directed.  #1 pack x 0   Entered by:   Darra Lis RMA   Authorized by:   [name] MD   Signed by:   Darra Lis RMA on 07/09/2009   Method used:   Electronically to        [facility] [id number]* (retail)       [address] [place], [place]  [address]       Ph: [phone]       Fax: [phone]   RxID:   [id number]

## 2010-12-17 NOTE — Assessment & Plan Note (Signed)
Summary: b12 inj/njr   Nurse Visit   Allergies: 1)  ! Lipitor (Atorvastatin Calcium) 2)  ! * Statins  ( Crestor) 3)  Prednisone Intensol (Prednisone) 4)  Amoxicillin (Amoxicillin) 5)  Cellcept (Mycophenolate Mofetil) 6)  Cipro  Medication Administration  Injection # 1:    Medication: Vit B12 1000 mcg    Diagnosis: B12 DEFICIENCY (ICD-266.2)    Route: IM    Site: R deltoid    Exp Date: 08/16/2011    Lot #: [id number]    Mfr: American Regent    Patient tolerated injection without complications    Given by: Willy Eddy, LPN (March 05, 2010 12:15 PM)  Orders Added: 1)  Vit B12 1000 mcg [J3420] 2)  Admin of Therapeutic Inj  intramuscular or subcutaneous [96045]

## 2010-12-17 NOTE — Progress Notes (Signed)
Summary: rx refill  Phone Note Call from Patient Call back at [phone]   Caller: Timothy Gallagher live Call For: [name] MD Summary of Call: patient wants to if his rx refill oxycodone is done.  I ckecked up front and it is not there,and patient is out of his med. Initial call taken by: Celine Ahr,  September 25, 2008 8:08 AM  Follow-up for Phone Call        Prescription is up front in the prescription folder.  Just checked.  Thanks.  Please call and let him know.   Follow-up by: Lynann Beaver CMA,  September 25, 2008 8:35 AM  Additional Follow-up for Phone Call Additional follow up Details #1::        Timothy Gallagher's wife notified. Additional Follow-up by: Lynann Beaver CMA,  September 25, 2008 8:46 AM

## 2010-12-17 NOTE — Progress Notes (Signed)
Summary: Cannot take Tramadol  Phone Note Call from Patient Call back at Home Phone [phone]   Caller: Patient Call For: [name] Summary of Call: Pt reporting he tried 1 Tramadol yesterday afternoon and began to feel nauseated.  He took a 2nd 8 hours later and began gagging with dry heaves.  Not taking any more.  Pt took one Oxycodone this morning and is starting to feel better. [facility] [facility] Initial call taken by: Sid Falcon LPN,  September 21, 2008 8:10 AM  Follow-up for Phone Call        talked with pt and he has several oxycodone and he will use that for headache Follow-up by: Willy Eddy, LPN,  September 21, 2008 9:09 AM

## 2010-12-17 NOTE — Progress Notes (Signed)
Summary: red yeast rice  Phone Note Call from Patient   Caller: Patient Call For: [name] MD Summary of Call: Red yeast rice is making his stool red.  Is this normal? [phone] Initial call taken by: Lynann Beaver CMA,  April 26, 2010 8:54 AM  Follow-up for Phone Call        per dr [name] the answer is yes Follow-up by: Willy Eddy, LPN,  April 26, 2010 9:02 AM

## 2010-12-17 NOTE — Assessment & Plan Note (Signed)
Summary: b12 shot/Bonnye/nta  Nurse Visit    Prior Medications: CYANOCOBALAMIN 1000 MCG/ML SOLN (CYANOCOBALAMIN) Inject 1 mg intramuscularly DIPHENOXYLATE-ATROPINE 2.5-0.025 MG TABS (DIPHENOXYLATE-ATROPINE)  LONGS ONE DAILY MULTI VITAMIN  TABS (MULTIPLE VITAMIN) Take 1 tablet by mouth once a day NADOLOL 20 MG TABS (NADOLOL) 1/2 every day Current Allergies: PREDNISONE INTENSOL (PREDNISONE) AMOXICILLIN (AMOXICILLIN) CELLCEPT (MYCOPHENOLATE MOFETIL) CIPRO (CIPROFLOXACIN)    Medication Administration  Injection # 1:    Medication: Vit B12 1000 mcg    Diagnosis: B12 DEFICIENCY (ICD-266.2)    Route: IM    Site: R deltoid    Exp Date: 09/17/2008    Lot #: [id number]    Mfr: AMERICAN REGENT    Patient tolerated injection without complications    Given by: Willy Eddy, LPN (July 21, 2007 11:55 AM)  Orders Added: 1)  Vit B12 1000 mcg [J3420] 2)  Admin of Therapeutic Inj  intramuscular or subcutaneous [90772]      Medication Administration  Injection # 1:    Medication: Vit B12 1000 mcg    Diagnosis: B12 DEFICIENCY (ICD-266.2)    Route: IM    Site: R deltoid    Exp Date: 09/17/2008    Lot #: [id number]    Mfr: AMERICAN REGENT    Patient tolerated injection without complications    Given by: Willy Eddy, LPN (July 21, 2007 11:55 AM)  Orders Added: 1)  Vit B12 1000 mcg [J3420] 2)  Admin of Therapeutic Inj  intramuscular or subcutaneous [90772]

## 2010-12-17 NOTE — Assessment & Plan Note (Signed)
Summary: B12 shot/jls   Nurse Visit    Prior Medications: CYANOCOBALAMIN 1000 MCG/ML SOLN (CYANOCOBALAMIN) 1.5 ml every 3 weeks DIPHENOXYLATE-ATROPINE 2.5-0.025 MG TABS (DIPHENOXYLATE-ATROPINE) as needed LONGS ONE DAILY MULTI VITAMIN  TABS (MULTIPLE VITAMIN) Take 1 tablet by mouth once a day NADOLOL 20 MG TABS (NADOLOL) 1/2 every day BETAMETHASONE DIPROPIONATE AUG 0.05 %  OINT (AUG BETAMETHASONE DIPROPIONATE) apply to rash OMNARIS 50 MCG/ACT SUSP (CICLESONIDE) two spray in each nostril daily [facility] LORATADINE 10 MG  TABS (LORATADINE) 1 once daily MICARDIS 40 MG TABS (TELMISARTAN) one by mouth daily at bed time XYZAL 5 MG TABS (LEVOCETIRIZINE DIHYDROCHLORIDE) one by mouth daily PARAFON FORTE DSC 500 MG TABS (CHLORZOXAZONE) 1 three times a day INDAPAMIDE 1.25 MG TABS (INDAPAMIDE) 1/ 2 once daily PERCOCET 5-325 MG TABS (OXYCODONE-ACETAMINOPHEN) 1-2 every 6 hours prn PROPOXYPHENE N-APAP 100-325 MG TABS (PROPOXYPHENE N-APAP) 1-2 every 4-6-hours as needed pain Current Allergies: ! LIPITOR (ATORVASTATIN CALCIUM) ! * STATINS  ( CRESTOR) PREDNISONE INTENSOL (PREDNISONE) AMOXICILLIN (AMOXICILLIN) CELLCEPT (MYCOPHENOLATE MOFETIL) CIPRO    Medication Administration  Injection # 1:    Medication: Vit B12 1000 mcg    Diagnosis: B12 DEFICIENCY (ICD-266.2)    Route: IM    Site: R deltoid    Exp Date: 06/17/2010    Lot #: [id number]    Mfr: American Regent    Patient tolerated injection without complications    Given by: Willy Eddy, LPN (November 27, 2008 12:06 PM)  Orders Added: 1)  Vit B12 1000 mcg [J3420] 2)  Admin of Therapeutic Inj  intramuscular or subcutaneous Lepidus.Putnam    ]

## 2010-12-17 NOTE — Assessment & Plan Note (Signed)
Summary: B12 shot/jls   Nurse Visit    Prior Medications: CYANOCOBALAMIN 1000 MCG/ML SOLN (CYANOCOBALAMIN) 1.5 ml every 3 weeks DIPHENOXYLATE-ATROPINE 2.5-0.025 MG TABS (DIPHENOXYLATE-ATROPINE) as needed \\par  LONGS ONE DAILY MULTI VITAMIN  TABS (MULTIPLE VITAMIN) Take 1 tablet by mouth once a day NADOLOL 20 MG TABS (NADOLOL) 1/2 every day HYZAAR 50-12.5 MG  TABS (LOSARTAN POTASSIUM-HCTZ) once daily PREDNISONE 10 MG  TABS (PREDNISONE) 1 once daily GABAPENTIN 100 MG  CAPS (GABAPENTIN) 2 per day hold BETAMETHASONE DIPROPIONATE AUG 0.05 %  OINT (AUG BETAMETHASONE DIPROPIONATE) apply to rash FLONASE 50 MCG/ACT  SUSP (FLUTICASONE PROPIONATE) one squirt R & L nostril bid Current Allergies: ! LIPITOR (ATORVASTATIN CALCIUM) ! * STATINS  ( CRESTOR) PREDNISONE INTENSOL (PREDNISONE) AMOXICILLIN (AMOXICILLIN) CELLCEPT (MYCOPHENOLATE MOFETIL) CIPRO    Medication Administration  Injection # 1:    Medication: Vit B12 1000 mcg    Diagnosis: B12 DEFICIENCY (ICD-266.2)    Route: IM    Site: R deltoid    Exp Date: 09/17/2009    Lot #: [id number]    Mfr: American Regent    Comments: 1.5 ml/1500 micrograms     Patient tolerated injection without complications    Given by: Willy Eddy, LPN (May 24, 9603 12:21 PM)  Orders Added: 1)  Vit B12 1000 mcg [J3420] 2)  Admin of Therapeutic Inj  intramuscular or subcutaneous Lepidus.Putnam    ]

## 2010-12-17 NOTE — Assessment & Plan Note (Signed)
Summary: 3 month f/up//db   Vital Signs:  Patient Profile:   75 Years Old Male Height:     72 inches Weight:      196 pounds Temp:     98.2 degrees F oral Pulse rate:   76 / minute Resp:     14 per minute BP sitting:   130 / 80  (left arm)  Vitals Entered By: Willy Eddy, LPN (January 12, 2008 11:39 AM)                 Chief Complaint:  roalabs.  History of Present Illness: Stopped the lipitor and the myalgia have improved. Was so sore that could "not  Off the cholesterol The blood pressure  is good Current Problems:  MULTIPLE  MYELOMA (ICD-203.00)  this has not been diagnosed and is a survience or question of issue with no change in the studeis over this year DERMATITIS, ATOPIC (ICD-691.8) IRRITABLE BOWEL SYNDROME (ICD-564.1) NEUROPATHY, IDIOPATHIC PERIPHERAL NEC (ICD-356.8) B12 DEFICIENCY (ICD-266.2) KIDNEY DISEASE (ICD-593.9) ALLERGIC RHINITIS (ICD-477.9) HYPERTENSION (ICD-401.9)     Hypertension History:      He denies headache, chest pain, palpitations, dyspnea with exertion, orthopnea, PND, peripheral edema, visual symptoms, neurologic problems, syncope, and side effects from treatment.        Positive major cardiovascular risk factors include male age 79 years old or older and hypertension.  Negative major cardiovascular risk factors include non-tobacco-user status.       Current Allergies: PREDNISONE INTENSOL (PREDNISONE) AMOXICILLIN (AMOXICILLIN) CELLCEPT (MYCOPHENOLATE MOFETIL) CIPRO (CIPROFLOXACIN)  Past Medical History:    Reviewed history from 08/11/2007 and no changes required:       Hypertension       B12 deff.       Allergic rhinitis       nephritis       neuropathy         Past Surgical History:    Reviewed history from 08/11/2007 and no changes required:       Inguinal herniorrhaphy       Lumbar laminectomy       Lumbar fusion       Tonsillectomy   Family History:    Reviewed history from 06/28/2007 and no changes  required:       n/c  Social History:    Reviewed history from 06/28/2007 and no changes required:       Retired       Married       Never Smoked    Review of Systems  The patient denies anorexia, fever, weight loss, weight gain, vision loss, decreased hearing, hoarseness, chest pain, syncope, dyspnea on exhertion, peripheral edema, prolonged cough, hemoptysis, abdominal pain, melena, hematochezia, severe indigestion/heartburn, hematuria, incontinence, genital sores, and muscle weakness.     Physical Exam  General:     Well-developed,well-nourished,in no acute distress; alert,appropriate and cooperative throughout examination Head:     male-pattern balding.   Ears:     External ear exam shows no significant lesions or deformities.  Otoscopic examination reveals clear canals, tympanic membranes are intact bilaterally without bulging, retraction, inflammation or discharge. Hearing is grossly normal bilaterally. Nose:     no external deformity and mucosal edema.   Mouth:     Oral mucosa and oropharynx without lesions or exudates.  Teeth in good repair. Neck:     No deformities, masses, or tenderness noted. Lungs:     Normal respiratory effort, chest expands symmetrically. Lungs are clear to auscultation, no crackles or  wheezes. Heart:     normal rate, no murmur, and extrasystoles noted.   Abdomen:     soft, no distention, no masses, and no guarding.   Msk:     no joint swelling, no joint warmth, and no redness over joints.   Neurologic:     alert & oriented X3.   Skin:     some hyperpigmentation to lower extremity along the tibial plateau, and the dorsum of the foot    Impression & Recommendations:  Problem # 1:  B12 DEFICIENCY (ICD-266.2)  Problem # 2:  KIDNEY DISEASE (ICD-593.9) with mylopathy ruled out with the  blood work  Problem # 3:  ALLERGIC RHINITIS (ICD-477.9)  The following medications were removed from the medication list:    Xyzal 5 Mg Tabs  (Levocetirizine dihydrochloride) ..... Once daily    Nasacort Aq 55 Mcg/act Aers (Triamcinolone acetonide(nasal)) .[name] Kitchen..[name] Kitchen Two sprays q nostril daily Discussed use of allergy medications and environmental measures.   Problem # 4:  IRRITABLE BOWEL SYNDROME (ICD-564.1) Assessment: Unchanged  Problem # 5:  HYPERTENSION (ICD-401.9)  His updated medication list for this problem includes:    Nadolol 20 Mg Tabs (Nadolol) .[name] Kitchen... 1/2 every day    Hyzaar 50-12.5 Mg Tabs (Losartan potassium-hctz) ..... Once daily  BP today: 130/80 Prior BP: 124/78 (10/12/2007)  Prior 10 Yr Risk Heart Disease: Not enough information (08/11/2007)  Labs Reviewed: Creat: 0.8 (10/26/2007)   Complete Medication List: 1)  Cyanocobalamin 1000 Mcg/ml Soln (Cyanocobalamin) .... Inject 1 mg intramuscularly 2)  Diphenoxylate-atropine 2.5-0.025 Mg Tabs (Diphenoxylate-atropine) .... As needed \\par  3)  Longs One Daily Multi Vitamin Tabs (Multiple vitamin) .... Take 1 tablet by mouth once a day 4)  Nadolol 20 Mg Tabs (Nadolol) .... 1/2 every day 5)  Hyzaar 50-12.5 Mg Tabs (Losartan potassium-hctz) .... Once daily 6)  Desowen 0.05 % Crea (Desonide) .... Apply with eucerin cream 50/50 to hands  Hypertension Assessment/Plan:      The patient's hypertensive risk group is category B: At least one risk factor (excluding diabetes) with no target organ damage.  Today's blood pressure is 130/80.  His blood pressure goal is < 125/75.   Patient Instructions: 1)  Please schedule a follow-up appointment in 3 months.    ]

## 2010-12-17 NOTE — Assessment & Plan Note (Signed)
Summary: FUP/B12/CCM   Vital Signs:  Patient Profile:   75 Years Old Male Height:     72 inches Weight:      192 pounds Temp:     97.8 degrees F oral Pulse rate:   72 / minute Resp:     14 per minute BP sitting:   130 / 84  (left arm)  Vitals Entered By: Willy Eddy, LPN (August 11, 2007 10:47 AM)                 Chief Complaint:  roa/ dr Lowell Guitar, nephrologist, and rx with hyzaar 50.12/5 qd.  History of Present Illness: Pt present post visit with Dr Lowell Guitar when he understood that he had "severe" kidney disease. Pt has nephritis with resultant proteinuria but his creatinine is stable. Dr Lowell Guitar added an ARB at the last visit  Hypertension History:      He complains of headache, but denies chest pain, palpitations, dyspnea with exertion, orthopnea, PND, peripheral edema, visual symptoms, neurologic problems, and syncope.  He notes no problems with any antihypertensive medication side effects.  Further comments include: stable.        Positive major cardiovascular risk factors include male age 46 years old or older and hypertension.  Negative major cardiovascular risk factors include non-tobacco-user status.     Current Allergies: PREDNISONE INTENSOL (PREDNISONE) AMOXICILLIN (AMOXICILLIN) CELLCEPT (MYCOPHENOLATE MOFETIL) CIPRO (CIPROFLOXACIN)  Past Medical History:    Reviewed history from 04/09/2007 and no changes required:       Hypertension       B12 deff.       Allergic rhinitis       nephritis       neuropathy         Past Surgical History:    Reviewed history and no changes required:       Inguinal herniorrhaphy       Lumbar laminectomy       Lumbar fusion       Tonsillectomy   Family History:    Reviewed history from 06/28/2007 and no changes required:       n/c  Social History:    Reviewed history from 06/28/2007 and no changes required:       Retired       Married       Never Smoked    Review of Systems       The patient complains of  severe indigestion/heartburn.  The patient denies anorexia, fever, weight loss, decreased hearing, hoarseness, chest pain, syncope, dyspnea on exhertion, peripheral edema, abdominal pain, muscle weakness, suspicious skin lesions, and transient blindness.         Pt has plue 2 edeam that resolves overnight   Physical Exam  General:     alert.   Head:     normocephalic.   Eyes:     pupils equal and pupils round.   Ears:     R ear normal and L ear normal.   Nose:     no external deformity and mucosal edema.   Mouth:     pharynx pink and moist.   Neck:     No deformities, masses, or tenderness noted. Lungs:     Normal respiratory effort, chest expands symmetrically. Lungs are clear to auscultation, no crackles or wheezes. Heart:     normal rate, no murmur, and extrasystoles noted.   Abdomen:     soft, no distention, no masses, and no guarding.   Rectal:  No external abnormalities noted. Normal sphincter tone. No rectal masses or tenderness. Msk:     no joint swelling, no joint warmth, and no redness over joints.   Extremities:     2+ left pedal edema and 2+ right pedal edema.   Neurologic:     alert & oriented X3.      Impression & Recommendations:  Problem # 1:  HYPERTENSION (ICD-401.9)  His updated medication list for this problem includes:    Nadolol 20 Mg Tabs (Nadolol) .[name] Kitchen... 1/2 every day    Hyzaar 50-12.5 Mg Tabs (Losartan potassium-hctz) ..... Once daily  BP today: 130/84 Prior BP: 120/80 (06/28/2007)  10 Yr Risk Heart Disease: Not enough information  Labs Reviewed: Creat: 0.78 (03/12/2007)   Problem # 2:  KIDNEY DISEASE (ICD-593.9) adding hyzaar to nadololo for renal protection  review Dr Vira Agar blood work from   Problem # 3:  NEUROPATHY, IDIOPATHIC PERIPHERAL NEC (ICD-356.8) lyrica added to help pain  Problem # 4:  B12 DEFICIENCY (ICD-266.2) hx of B12 def Orders: Vit B12 1000 mcg (J3420) Admin of Therapeutic Inj  intramuscular or subcutaneous  (11914)   Medications Added to Medication List This Visit: 1)  Diphenoxylate-atropine 2.5-0.025 Mg Tabs (Diphenoxylate-atropine) .... As needed \\par  2)  Xyzal 5 Mg Tabs (Levocetirizine dihydrochloride) .... Once daily 3)  Hyzaar 50-12.5 Mg Tabs (Losartan potassium-hctz) .... Once daily 4)  Lyrica 50 Mg Caps (Pregabalin) .... On po q hs 5)  Fish Oil Concentrate 1000 Mg Caps (Omega-3 fatty acids) .... 2-4 capsules daily  Complete Medication List: 1)  Cyanocobalamin 1000 Mcg/ml Soln (Cyanocobalamin) .... Inject 1 mg intramuscularly 2)  Diphenoxylate-atropine 2.5-0.025 Mg Tabs (Diphenoxylate-atropine) .... As needed \\par  3)  Longs One Daily Multi Vitamin Tabs (Multiple vitamin) .... Take 1 tablet by mouth once a day 4)  Nadolol 20 Mg Tabs (Nadolol) .... 1/2 every day 5)  Xyzal 5 Mg Tabs (Levocetirizine dihydrochloride) .... Once daily 6)  Hyzaar 50-12.5 Mg Tabs (Losartan potassium-hctz) .... Once daily 7)  Lyrica 50 Mg Caps (Pregabalin) .... On po q hs 8)  Fish Oil Concentrate 1000 Mg Caps (Omega-3 fatty acids) .... 2-4 capsules daily  Hypertension Assessment/Plan:      The patient's hypertensive risk group is category B: At least one risk factor (excluding diabetes) with no target organ damage.  Today's blood pressure is 130/84.  His blood pressure goal is < 140/90.   Patient Instructions: 1)  Please schedule a follow-up appointment in 2 months.    Prescriptions: LYRICA 50 MG  CAPS (PREGABALIN) on po q HS  #30 x 6   Entered and Authorized by:   [name] MD   Signed by:   [name] MD on 08/12/2007   Method used:   Historical   RxID:   [id number]  ]  Medication Administration  Injection # 1:    Medication: Vit B12 1000 mcg    Diagnosis: B12 DEFICIENCY (ICD-266.2)    Route: IM    Site: R deltoid    Exp Date: 09/17/2008    Lot #: [id number]    Mfr: american regent    Patient tolerated injection without complications    Given by: Willy Eddy, LPN (August 11, 2007 10:50 AM)  Orders Added: 1)  Vit B12 1000 mcg [J3420] 2)  Admin of Therapeutic Inj  intramuscular or subcutaneous [90772] 3)  Est. Patient Level IV [95284]

## 2010-12-17 NOTE — Progress Notes (Signed)
Summary: CHANGE MEDS   Phone Note Call from Patient Call back at Home Phone [phone]   Caller: Patient Call For: PATTERSON Reason for Call: Talk to Nurse Summary of Call: meds prescb last week is too much $. Is there a different med?  Initial call taken by: Guadlupe Spanish [facility],  May 07, 2009 12:41 PM  Follow-up for Phone Call        patient can not afford Xifaxan so I advised him I can give samples but he woul dlike Dr. Jarold Motto to review CT he had done this AM and make sure he needs it first. Follow-up by: Harlow Mares CMA,  May 07, 2009 12:51 PM  Additional Follow-up for Phone Call Additional follow up Details #1::        Ct is OK...do xifaxan rx... Additional Follow-up by: Mardella Layman MD [facility],  May 07, 2009 1:20 PM    Additional Follow-up for Phone Call Additional follow up Details #2::    gave patient 3 more days of xifaxan and I have contacted the rep to bring me samples for the rest of the days. I will call paitent when I get them Follow-up by: Harlow Mares CMA,  May 07, 2009 2:56 PM    Appended Document: CHANGE MEDS called patietn advised him samples are up front for the remainder of the days.   Clinical Lists Changes  Medications: Changed medication from * XIFAXAN 550 MG take one tablet by mouth two times a day to * XIFAXAN 550 MG take one tablet by mouth two times a day

## 2010-12-17 NOTE — Progress Notes (Signed)
Summary: RLQ pain   Phone Note Call from Patient   Caller: Patient Summary of Call: Pt calling.  States he is having RLQ pain.  thinks he may have a hernia.  Has had surgery before for hernias.  Would like to move up appt for colon which is sch for 11/28/09.  Colon reschedulde for 11/21/09.   Initial call taken by: Ashok Cordia RN,  November 13, 2009 12:57 PM

## 2010-12-17 NOTE — Assessment & Plan Note (Signed)
Summary: b12 inj//ccm--wants to see dr Elesa Garman//ccm   Vital Signs:  Patient profile:   75 year old male Height:      72 inches Weight:      141 pounds BMI:     19.19 Temp:     98.2 degrees F oral Pulse rate:   68 / minute Resp:     14 per minute BP sitting:   140 / 80  (left arm)  Vitals Entered By: Willy Eddy, LPN (February 12, 2010 11:07 AM) CC: roa, Hypertension Management, Lipid Management   Primary Care Provider:  Darryll Capers, MD  CC:  roa, Hypertension Management, and Lipid Management.  History of Present Illness: follow up allergies, HTN and lipids with lipid managemet today  Hypertension History:      He denies headache, chest pain, palpitations, dyspnea with exertion, orthopnea, PND, peripheral edema, visual symptoms, neurologic problems, syncope, and side effects from treatment.        Positive major cardiovascular risk factors include male age 51 years old or older, hyperlipidemia, and hypertension.  Negative major cardiovascular risk factors include non-tobacco-user status.    Lipid Management History:      Positive NCEP/ATP III risk factors include male age 66 years old or older, HDL cholesterol less than 40, and hypertension.  Negative NCEP/ATP III risk factors include non-tobacco-user status.      Preventive Screening-Counseling & Management  Alcohol-Tobacco     Smoking Status: quit     Packs/Day: 0.75     Year Started: 1964     Year Quit: 2003  Current Problems (verified): 1)  Gastroenteritis, Viral  (ICD-008.8) 2)  Acne Rosacea  (ICD-695.3) 3)  Chest Wall Pain, Acute  (ICD-786.52) 4)  Abdominal Pain Other Specified Site  (ICD-789.09) 5)  Elevated Sedimentation Rate  (ICD-790.1) 6)  Rlq Pain  (ICD-789.03) 7)  Colitis  (ICD-558.9) 8)  Esophageal Stricture  (ICD-530.3) 9)  Gastritis, Chronic  (ICD-535.10) 10)  Colonic Polyps, Hx of  (ICD-V12.72) 11)  Diverticulosis, Colon  (ICD-562.10) 12)  Actinic Keratosis, Head  (ICD-702.0) 13)  Eye  Floaters  (ICD-379.24) 14)  Neck Pain  (ICD-723.1) 15)  Ecchymoses, Spontaneous  (ICD-782.7) 16)  Chronic Maxillary Sinusitis  (ICD-473.0) 17)  Back Pain With Radiculopathy  (ICD-729.2) 18)  Hyperlipidemia  (ICD-272.4) 19)  Dermatitis, Atopic  (ICD-691.8) 20)  Irritable Bowel Syndrome  (ICD-564.1) 21)  Neuropathy, Idiopathic Peripheral Nec  (ICD-356.8) 22)  B12 Deficiency  (ICD-266.2) 23)  Kidney Disease  (ICD-593.9) 24)  Allergic Rhinitis  (ICD-477.9) 25)  Hypertension  (ICD-401.9)  Current Medications (verified): 1)  Cyanocobalamin 1000 Mcg/ml Soln (Cyanocobalamin) .... 1.5 Ml Every 3 Weeks 2)  Diphenoxylate-Atropine 2.5-0.025 Mg Tabs (Diphenoxylate-Atropine) .... As Needed 3)  Nadolol 20 Mg Tabs (Nadolol) .... 1/2 Every Day 4)  Losartan Potassium 50 Mg Tabs (Losartan Potassium) .... One By Mouth Daily 5)  Indapamide 1.25 Mg Tabs (Indapamide) .... 1/ 2 Once Daily 6)  Centrum Silver  Tabs (Multiple Vitamins-Minerals) .... One Tablet By Mouth Once Daily 7)  Fish Oil   Oil (Fish Oil) .... One Tablet By Mouth Once Daily 8)  Tylenol Extra Strength 500 Mg Tabs (Acetaminophen) .... As Needed For Pain 9)  Xifaxan 550 Mg .... Take One Tablet By Mouth Two Times A Day 10)  Clindamycin Phos-Benzoyl Perox 1-5 % Gel (Clindamycin Phos-Benzoyl Perox) .... Apply To Face Daily After Washing 11)  Astepro 0.15 % Soln (Azelastine Hcl) .... Two Spray in Each Nostril Daily 12)  [facility]vs Allergy Relief 10 Mg  Tabs (Loratadine) .... One Tablet By Mouth Once Daily  Allergies (verified): 1)  ! Lipitor (Atorvastatin Calcium) 2)  ! * Statins  ( Crestor) 3)  Prednisone Intensol (Prednisone) 4)  Amoxicillin (Amoxicillin) 5)  Cellcept (Mycophenolate Mofetil) 6)  Cipro  Past History:  Family History: Last updated: 05/01/2009 n/c No FH of Colon Cancer:  Social History: Last updated: 05/01/2009 Retired Married Former Smoker Alcohol Use - no Illicit Drug Use - no Patient does not get regular exercise.   Daily Caffeine Use: occ  Risk Factors: Exercise: no (05/01/2009)  Risk Factors: Smoking Status: quit (02/12/2010) Packs/Day: 0.75 (02/12/2010)  Past medical, surgical, family and social histories (including risk factors) reviewed, and no changes noted (except as noted below).  Past Medical History: Reviewed history from 05/01/2009 and no changes required. Current Problems:  RLQ PAIN (ICD-789.03) COLITIS (ICD-558.9) ESOPHAGEAL STRICTURE (ICD-530.3) GASTRITIS, CHRONIC (ICD-535.10) COLONIC POLYPS, HX OF (ICD-V12.72) DIVERTICULOSIS, COLON (ICD-562.10) ACTINIC KERATOSIS, HEAD (ICD-702.0) EYE FLOATERS (ICD-379.24) NECK PAIN (ICD-723.1) ESOPHAGEAL REFLUX (ICD-530.81) ECCHYMOSES, SPONTANEOUS (ICD-782.7) CHRONIC MAXILLARY SINUSITIS (ICD-473.0) BACK PAIN WITH RADICULOPATHY (ICD-729.2) HYPERLIPIDEMIA (ICD-272.4) R/O MULTIPLE  MYELOMA (ICD-203.00) DERMATITIS, ATOPIC (ICD-691.8) IRRITABLE BOWEL SYNDROME (ICD-564.1) NEUROPATHY, IDIOPATHIC PERIPHERAL NEC (ICD-356.8) B12 DEFICIENCY (ICD-266.2) KIDNEY DISEASE (ICD-593.9) ALLERGIC RHINITIS (ICD-477.9) HYPERTENSION (ICD-401.9)  Past Surgical History: Reviewed history from 08/11/2007 and no changes required. Inguinal herniorrhaphy Lumbar laminectomy Lumbar fusion Tonsillectomy  Family History: Reviewed history from 05/01/2009 and no changes required. n/c No FH of Colon Cancer:  Social History: Reviewed history from 05/01/2009 and no changes required. Retired Married Former Smoker Alcohol Use - no Illicit Drug Use - no Patient does not get regular exercise.  Daily Caffeine Use: occ   Impression & Recommendations:  Problem # 1:  HYPERLIPIDEMIA (ICD-272.4) Assessment Unchanged  monitering lipids and ajust plan based on  results Labs Reviewed: SGOT: 23 (10/30/2009)   SGPT: 14 (10/30/2009)  Prior 10 Yr Risk Heart Disease: Not enough information (03/27/2009)   HDL:33.90 (03/27/2009), 74.0 (02/15/2008)  LDL:DEL  (02/15/2008)  Chol:256 (03/27/2009), 419 (02/15/2008)  Trig:278 (02/15/2008)  Orders: TLB-Cholesterol, HDL (83718-HDL) TLB-Cholesterol, Direct LDL (83721-DIRLDL) TLB-Cholesterol, Total (82465-CHO) TLB-TSH (Thyroid Stimulating Hormone) (84443-TSH)  Problem # 2:  ALLERGIC RHINITIS (ICD-477.9) Assessment: Deteriorated needs to be on the loratidine and  The following medications were removed from the medication list:    Lodrane 12 Hour 6 Mg Xr12h-tab (Brompheniramine maleate) ..... One by mouth bid    Promethazine Hcl 25 Mg Tabs (Promethazine hcl) ..... One by mouth every 6 hrs nausea His updated medication list for this problem includes:    Astepro 0.15 % Soln (Azelastine hcl) .[name] Kitchen..[name] Kitchen Two spray in each nostril daily    Cvs Allergy Relief 10 Mg Tabs (Loratadine) ..... One tablet by mouth once daily  Discussed use of allergy medications and environmental measures.   Problem # 3:  BACK PAIN WITH RADICULOPATHY (ICD-729.2)  Complete Medication List: 1)  Cyanocobalamin 1000 Mcg/ml Soln (Cyanocobalamin) .... 1.5 ml every 3 weeks 2)  Diphenoxylate-atropine 2.5-0.025 Mg Tabs (Diphenoxylate-atropine) .... As needed 3)  Nadolol 20 Mg Tabs (Nadolol) .... 1/2 every day 4)  Losartan Potassium 50 Mg Tabs (Losartan potassium) .... One by mouth daily 5)  Indapamide 1.25 Mg Tabs (Indapamide) .... 1/ 2 once daily 6)  Centrum Silver Tabs (Multiple vitamins-minerals) .... One tablet by mouth once daily 7)  Fish Oil Oil (Fish oil) .... One tablet by mouth once daily 8)  Tylenol Extra Strength 500 Mg Tabs (Acetaminophen) .... As needed for pain 9)  Xifaxan 550 Mg  .[name] KitchenMarland KitchenMarland Kitchen  Take one tablet by mouth two times a day 10)  Clindamycin Phos-benzoyl Perox 1-5 % Gel (Clindamycin phos-benzoyl perox) .... Apply to face daily after washing 11)  Astepro 0.15 % Soln (Azelastine hcl) .... Two spray in each nostril daily 12)  Cvs Allergy Relief 10 Mg Tabs (Loratadine) .... One tablet by mouth once daily  Other Orders: Vit B12  1000 mcg (J3420) Admin of Therapeutic Inj  intramuscular or subcutaneous (04540) Venipuncture (98119) TLB-BMP (Basic Metabolic Panel-BMET) (80048-METABOL)  Hypertension Assessment/Plan:      The patient's hypertensive risk group is category B: At least one risk factor (excluding diabetes) with no target organ damage.  Today's blood pressure is 140/80.  His blood pressure goal is < 125/75.  Lipid Assessment/Plan:      Based on NCEP/ATP III, the patient's risk factor category is "2 or more risk factors and a calculated 10 year CAD risk of > 20%".  The patient's lipid goals are as follows: Total cholesterol goal is 200; LDL cholesterol goal is 130; HDL cholesterol goal is 40; Triglyceride goal is 150.    Patient Instructions: 1)  Please schedule a follow-up appointment in 3 months. Prescriptions: ASTEPRO 0.15 % SOLN (AZELASTINE HCL) TWO SPRAY IN EACH NOSTRIL DAILY  #3 vials x 3   Entered and Authorized by:   [name] MD   Signed by:   [name] MD on 02/12/2010   Method used:   Electronically to        [facility]* (mail-order)             ,          Ph: [phone]       Fax: [phone]   RxID:   [id number]    Medication Administration  Injection # 1:    Medication: Vit B12 1000 mcg    Diagnosis: B12 DEFICIENCY (ICD-266.2)    Route: IM    Site: L deltoid    Exp Date: 08/16/2011    Lot #: [id number]    Mfr: American Regent    Comments: 1.15ml/1500mcg given    Patient tolerated injection without complications    Given by: Willy Eddy, LPN (February 12, 2010 11:09 AM)  Orders Added: 1)  Vit B12 1000 mcg [J3420] 2)  Admin of Therapeutic Inj  intramuscular or subcutaneous [96372] 3)  Est. Patient Level IV [66440] 4)  Venipuncture [36415] 5)  TLB-BMP (Basic Metabolic Panel-BMET) [80048-METABOL] 6)  TLB-Cholesterol, HDL [83718-HDL] 7)  TLB-Cholesterol, Direct LDL [83721-DIRLDL] 8)  TLB-Cholesterol, Total [82465-CHO] 9)  TLB-TSH (Thyroid Stimulating Hormone)  [34742-VZD]

## 2010-12-17 NOTE — Letter (Signed)
Summary: Patient Notice- Colon Biospy Results  [facility]  [address] [place], [place] [address]   Phone: [phone]  Fax: [phone]        November 23, 2009 MRN: [id number]    Timothy Gallagher [address] [place], [place]  [address]    Dear Mr. PILOT,  I am pleased to inform you that the biopsies taken during your recent colonoscopy did not show any evidence of cancer upon pathologic examination.  Additional information/recommendations:  _XX_No further action is needed at this time.  Please follow-up with      your primary care physician for your other healthcare needs.  __Please call [phone] to schedule a return visit to review      your condition.  __Continue with the treatment plan as outlined on the day of your      exam.  __You should have a repeat colonoscopy examination for this problem           in _ years.  Please call us if you are having persistent problems or have questions about your condition that have not been fully answered at this time.  Sincerely,  Mardella Layman MD [facility]   This letter has been electronically signed by your physician.  Appended Document: Patient Notice- Colon Biospy Results Letter mailed 01.10.11

## 2010-12-17 NOTE — Progress Notes (Signed)
  Phone Note Call from Patient   Summary of Call: [facility] [phone] Post nasal drainage worse. Initial call taken by: Lynann Beaver CMA,  July 05, 2009 1:45 PM  Follow-up for Phone Call        nasal spray sent in-please tell pt Follow-up by: Willy Eddy, LPN,  July 06, 2009 8:38 AM    New/Updated Medications: ATROVENT 0.03 % SOLN (IPRATROPIUM BROMIDE) nasal spray- 1 spray in each nostril two times a day Prescriptions: ATROVENT 0.03 % SOLN (IPRATROPIUM BROMIDE) nasal spray- 1 spray in each nostril two times a day  #1 x 1   Entered by:   Willy Eddy, LPN   Authorized by:   [name] MD   Signed by:   Willy Eddy, LPN on 45/40/9811   Method used:   Electronically to        [facility] [id number]* (retail)       [address] [place], [place]  [address]       Ph: [phone]       Fax: [phone]   RxID:   [id number]  Pt notified.

## 2010-12-17 NOTE — Assessment & Plan Note (Signed)
Summary: B12 injection/dm   Nurse Visit    Prior Medications: CYANOCOBALAMIN 1000 MCG/ML SOLN (CYANOCOBALAMIN) 1.5 ml every 3 weeks DIPHENOXYLATE-ATROPINE 2.5-0.025 MG TABS (DIPHENOXYLATE-ATROPINE) as needed LONGS ONE DAILY MULTI VITAMIN  TABS (MULTIPLE VITAMIN) Take 1 tablet by mouth once a day NADOLOL 20 MG TABS (NADOLOL) 1/2 every day BETAMETHASONE DIPROPIONATE AUG 0.05 %  OINT (AUG BETAMETHASONE DIPROPIONATE) apply to rash OMNARIS 50 MCG/ACT SUSP (CICLESONIDE) two spray in each nostril daily [facility] LORATADINE 10 MG  TABS (LORATADINE) 1 once daily MICARDIS 40 MG TABS (TELMISARTAN) one by mouth daily at bed time XYZAL 5 MG TABS (LEVOCETIRIZINE DIHYDROCHLORIDE) one by mouth daily PARAFON FORTE DSC 500 MG TABS (CHLORZOXAZONE) 1 three times a day INDAPAMIDE 1.25 MG TABS (INDAPAMIDE) 1 once daily PERCOCET 5-325 MG TABS (OXYCODONE-ACETAMINOPHEN) 1-2 every 6 hours prn TRAMADOL HCL 50 MG TABS (TRAMADOL HCL) Take one (1) tablet by mouth every 6 hours as needed for pain Current Allergies: ! LIPITOR (ATORVASTATIN CALCIUM) ! * STATINS  ( CRESTOR) PREDNISONE INTENSOL (PREDNISONE) AMOXICILLIN (AMOXICILLIN) CELLCEPT (MYCOPHENOLATE MOFETIL) CIPRO    Medication Administration  Injection # 1:    Medication: Vit B12 1000 mcg    Diagnosis: B12 DEFICIENCY (ICD-266.2)    Route: IM    Site: R deltoid    Exp Date: 09/17/2009    Lot #: [id number]    Mfr: American Regent    Patient tolerated injection without complications    Given by: Willy Eddy, LPN (September 25, 2008 12:10 PM)  Orders Added: 1)  Vit B12 1000 mcg [J3420] 2)  Admin of Therapeutic Inj  intramuscular or subcutaneous Lepidus.Putnam    ]

## 2010-12-17 NOTE — Assessment & Plan Note (Signed)
Summary: B12-BONNYE//CCM   Nurse Visit   Allergies: 1)  ! Lipitor (Atorvastatin Calcium) 2)  ! * Statins  ( Crestor) 3)  Prednisone Intensol (Prednisone) 4)  Amoxicillin (Amoxicillin) 5)  Cellcept (Mycophenolate Mofetil) 6)  Cipro  Medication Administration  Injection # 1:    Medication: Vit B12 1000 mcg    Diagnosis: B12 DEFICIENCY (ICD-266.2)    Route: IM    Site: L deltoid    Exp Date: 03/17/2011    Lot #: [id number]    Mfr: American Regent    Patient tolerated injection without complications    Given by: Willy Eddy, LPN (August 08, 2009 12:47 PM)  Orders Added: 1)  Vit B12 1000 mcg [J3420] 2)  Admin of Therapeutic Inj  intramuscular or subcutaneous [71696]

## 2010-12-17 NOTE — Assessment & Plan Note (Signed)
Summary: b12 inj/njr   Nurse Visit   Allergies: 1)  ! Lipitor (Atorvastatin Calcium) 2)  ! * Statins  ( Crestor) 3)  Prednisone Intensol (Prednisone) 4)  Amoxicillin (Amoxicillin) 5)  Cellcept (Mycophenolate Mofetil) 6)  Cipro (Ciprofloxacin)  Medication Administration  Injection # 1:    Medication: Vit B12 1000 mcg    Diagnosis: B12 DEFICIENCY (ICD-266.2)    Route: IM    Site: R deltoid    Exp Date: 10/11/2012    Lot #: [id number]    Mfr: American Regent    Comments: 1.46ml/1500mcg given    Patient tolerated injection without complications    Given by: Willy Eddy, LPN (October 08, 2010 11:59 AM)  Orders Added: 1)  Vit B12 1000 mcg [J3420] 2)  Admin of Therapeutic Inj  intramuscular or subcutaneous [98119]

## 2010-12-17 NOTE — Assessment & Plan Note (Signed)
Summary: b12 shot/nta   Nurse Visit     Allergies: 1)  ! Lipitor (Atorvastatin Calcium) 2)  ! * Statins  ( Crestor) 3)  Prednisone Intensol (Prednisone) 4)  Amoxicillin (Amoxicillin) 5)  Cellcept (Mycophenolate Mofetil) 6)  Cipro     Medication Administration  Injection # 1:    Medication: Vit B12 1000 mcg    Diagnosis: B12 DEFICIENCY (ICD-266.2)    Route: IM    Site: R deltoid    Exp Date: 07/11/2010    Lot #: [id number]    Mfr: American Regent    Patient tolerated injection without complications    Given by: Willy Eddy, LPN (February 20, 9603 12:13 PM)  Orders Added: 1)  Vit B12 1000 mcg [J3420] 2)  Admin of Therapeutic Inj  intramuscular or subcutaneous Lepidus.Putnam    ]

## 2010-12-17 NOTE — Progress Notes (Signed)
Summary: Oxycodone  Phone Note Call from Patient   Caller: Patient Call For: Dr [name] Summary of Call: Pt needs Oxycodone called to [facility]) Pt:  [phone] Initial call taken by: Lynann Beaver CMA,  September 22, 2008 11:23 AM  Follow-up for Phone Call        only if he is due for prescription Follow-up by: Birdie Sons MD,  September 22, 2008 2:04 PM      Prescriptions: PERCOCET 5-325 MG TABS (OXYCODONE-ACETAMINOPHEN) 1-2 every 6 hours prn  #30 x 0   Entered by:   Lynann Beaver CMA   Authorized by:   Gordy Savers  MD   Signed by:   Lynann Beaver CMA on 09/22/2008   Method used:   Print then Give to Patient   RxID:   [id number] PERCOCET 5-325 MG TABS (OXYCODONE-ACETAMINOPHEN) 1-2 every 6 hours prn  #30 x 0   Entered by:   Lynann Beaver CMA   Authorized by:   [name] MD   Signed by:   Lynann Beaver CMA on 09/22/2008   Method used:   Telephoned to ...       [id number]* (retail)       [address] [place], [place]  [address]       Ph: [phone]       Fax: [phone]   RxID:   [id number]  Prescription printed and will ask Dr. Kirtland Bouchard to sign.

## 2010-12-17 NOTE — Progress Notes (Signed)
Summary: samples   Phone Note Call from Patient Call back at Home Phone [phone]   Caller: Patient Call For: Liya Strollo Reason for Call: Talk to Nurse Summary of Call: Patient states that his pharmacy does not have Xixafan right now but they'll have it next week, patient wants to know if he can get samples until next week Initial call taken by: Tawni Levy,  May 04, 2009 1:58 PM  Follow-up for Phone Call        We do not have samples of Xifaxin 550mg . Pt. pharmacy will let him know when it will be available, on Monday. If he has any further problems he will callback to the office. Pt. instructed to call back as needed.  Follow-up by: Laureen Ochs LPN,  May 04, 2009 4:30 PM

## 2010-12-17 NOTE — Progress Notes (Signed)
Summary: sinus  Phone Note Call from Patient Call back at [phone]   Call For: jenkins Summary of Call: Sinus, dizzy, drainage clear.  No fever.  Going out of town 1pm.  What can I take?  Per [facility] Coricidin HBP.  Patient aware. Initial call taken by: Rudy Jew, RN,  April 28, 2008 11:43 AM

## 2010-12-17 NOTE — Assessment & Plan Note (Signed)
Summary: B-12 INJ // RS   Nurse Visit   Allergies: 1)  ! Lipitor (Atorvastatin Calcium) 2)  ! * Statins  ( Crestor) 3)  Prednisone Intensol (Prednisone) 4)  Amoxicillin (Amoxicillin) 5)  Cellcept (Mycophenolate Mofetil) 6)  Cipro  Medication Administration  Injection # 1:    Medication: Vit B12 1000 mcg    Diagnosis: B12 DEFICIENCY (ICD-266.2)    Route: IM    Site: L deltoid    Exp Date: 07/18/2011    Lot #: [id number]    Mfr: American Regent    Comments: 1.65ml given    Patient tolerated injection without complications    Given by: Willy Eddy, LPN (October 17, 2009 12:26 PM)  Orders Added: 1)  Vit B12 1000 mcg [J3420] 2)  Admin of Therapeutic Inj  intramuscular or subcutaneous [96045]

## 2010-12-17 NOTE — Progress Notes (Signed)
Summary: X-Ray result request  Phone Note From Other Clinic Call back at [facility] Phone [phone]   Caller: Patient Call For: [name] Reason for Call: Privacy/Consent Authorization Summary of Call: Pt requesting result of X-rays head and neck.  Pt still experiencing headaches, muscle relaxer meds gone, using Tylenol only.  Saturday BP was 158/105 AM, 165/108 noon, 149/94 PM. This AM, BP 144/99, and just now 166/106.   Pt frustrated with 5 weeks of ongoing headache over his right eye. Initial call taken by: Sid Falcon LPN,  September 18, 2008 8:54 AM  Follow-up for Phone Call        Pt called again, his head feels like it is going to blow-off, BP 10 min ago 193/139 and just now 207/121.  Dr [name] informed and his instruction was to take another dose of his Natalol and call [phone] for hospital evaluation.  Pt informed and he voiced his understanding. Requested he call our office with an update of his evaluation and symptoms.  dr [name] aware Follow-up by: Sid Falcon LPN,  September 18, 2008 10:51 AM

## 2010-12-17 NOTE — Assessment & Plan Note (Signed)
Summary: B12 W/DR Lennon Alstrom NURSE/CCM  Nurse Visit    Prior Medications: CYANOCOBALAMIN 1000 MCG/ML SOLN (CYANOCOBALAMIN) Inject 1 mg intramuscularly DIPHENOXYLATE-ATROPINE 2.5-0.025 MG TABS (DIPHENOXYLATE-ATROPINE) as needed \\par  LONGS ONE DAILY MULTI VITAMIN  TABS (MULTIPLE VITAMIN) Take 1 tablet by mouth once a day NADOLOL 20 MG TABS (NADOLOL) 1/2 every day XYZAL 5 MG  TABS (LEVOCETIRIZINE DIHYDROCHLORIDE) once daily HYZAAR 50-12.5 MG  TABS (LOSARTAN POTASSIUM-HCTZ) once daily LYRICA 50 MG  CAPS (PREGABALIN) on po q HS FISH OIL CONCENTRATE 1000 MG  CAPS (OMEGA-3 FATTY ACIDS) 2-4 capsules daily DESOWEN 0.05 %  CREA (DESONIDE) apply with eucerin cream 50/50 to hands NASACORT AQ 55 MCG/ACT  AERS (TRIAMCINOLONE ACETONIDE(NASAL)) two sprays q nostril daily Current Allergies: PREDNISONE INTENSOL (PREDNISONE) AMOXICILLIN (AMOXICILLIN) CELLCEPT (MYCOPHENOLATE MOFETIL) CIPRO (CIPROFLOXACIN)    Medication Administration  Injection # 1:    Medication: Vit B12 1000 mcg    Diagnosis: B12 DEFICIENCY (ICD-266.2)    Route: IM    Site: L deltoid    Exp Date: 02/2009    Lot #: [id number]    Mfr: American Regent    Patient tolerated injection without complications    Given by: Orlan Leavens (November 01, 2007 12:07 PM)  Orders Added: 1)  Vit B12 1000 mcg [J3420] 2)  Admin of Therapeutic Inj  intramuscular or subcutaneous Quintilian.Boros    ]

## 2010-12-17 NOTE — Assessment & Plan Note (Signed)
Summary: 3 MONTH ROV/NJR/B-12 INJ/CJR/PT RSC/CJR   Vital Signs:  Patient profile:   75 year old male Height:      72 inches Weight:      194 pounds BMI:     26.41 Temp:     98.2 degrees F oral Pulse rate:   76 / minute Resp:     14 per minute BP sitting:   140 / 80  (left arm)  Vitals Entered By: Willy Eddy, LPN (December 31, 2009 11:54 AM) CC: roa, Hypertension Management   Primary Care Provider:  Darryll Capers, MD  CC:  roa and Hypertension Management.  History of Present Illness: Dr Daphine Deutscher did a CT scan  and blood work for possible hernia Dr Daphine Deutscher discussed his diverticulosis dicussion of diet  and diverticulosis has complained about sinus  conjestions   Hypertension History:      He denies headache, chest pain, palpitations, dyspnea with exertion, orthopnea, PND, peripheral edema, visual symptoms, neurologic problems, syncope, and side effects from treatment.        Positive major cardiovascular risk factors include male age 36 years old or older, hyperlipidemia, and hypertension.  Negative major cardiovascular risk factors include non-tobacco-user status.     Preventive Screening-Counseling & Management  Alcohol-Tobacco     Smoking Status: quit     Packs/Day: 0.75     Year Started: 1964     Year Quit: 2003  Problems Prior to Update: 1)  Acne Rosacea  (ICD-695.3) 2)  Chest Wall Pain, Acute  (ICD-786.52) 3)  Abdominal Pain Other Specified Site  (ICD-789.09) 4)  Elevated Sedimentation Rate  (ICD-790.1) 5)  Rlq Pain  (ICD-789.03) 6)  Colitis  (ICD-558.9) 7)  Esophageal Stricture  (ICD-530.3) 8)  Gastritis, Chronic  (ICD-535.10) 9)  Colonic Polyps, Hx of  (ICD-V12.72) 10)  Diverticulosis, Colon  (ICD-562.10) 11)  Actinic Keratosis, Head  (ICD-702.0) 12)  Eye Floaters  (ICD-379.24) 13)  Neck Pain  (ICD-723.1) 14)  Ecchymoses, Spontaneous  (ICD-782.7) 15)  Chronic Maxillary Sinusitis  (ICD-473.0) 16)  Back Pain With Radiculopathy  (ICD-729.2) 17)   Hyperlipidemia  (ICD-272.4) 18)  Dermatitis, Atopic  (ICD-691.8) 19)  Irritable Bowel Syndrome  (ICD-564.1) 20)  Neuropathy, Idiopathic Peripheral Nec  (ICD-356.8) 21)  B12 Deficiency  (ICD-266.2) 22)  Kidney Disease  (ICD-593.9) 23)  Allergic Rhinitis  (ICD-477.9) 24)  Hypertension  (ICD-401.9)  Current Problems (verified): 1)  Acne Rosacea  (ICD-695.3) 2)  Chest Wall Pain, Acute  (ICD-786.52) 3)  Abdominal Pain Other Specified Site  (ICD-789.09) 4)  Elevated Sedimentation Rate  (ICD-790.1) 5)  Rlq Pain  (ICD-789.03) 6)  Colitis  (ICD-558.9) 7)  Esophageal Stricture  (ICD-530.3) 8)  Gastritis, Chronic  (ICD-535.10) 9)  Colonic Polyps, Hx of  (ICD-V12.72) 10)  Diverticulosis, Colon  (ICD-562.10) 11)  Actinic Keratosis, Head  (ICD-702.0) 12)  Eye Floaters  (ICD-379.24) 13)  Neck Pain  (ICD-723.1) 14)  Ecchymoses, Spontaneous  (ICD-782.7) 15)  Chronic Maxillary Sinusitis  (ICD-473.0) 16)  Back Pain With Radiculopathy  (ICD-729.2) 17)  Hyperlipidemia  (ICD-272.4) 18)  Dermatitis, Atopic  (ICD-691.8) 19)  Irritable Bowel Syndrome  (ICD-564.1) 20)  Neuropathy, Idiopathic Peripheral Nec  (ICD-356.8) 21)  B12 Deficiency  (ICD-266.2) 22)  Kidney Disease  (ICD-593.9) 23)  Allergic Rhinitis  (ICD-477.9) 24)  Hypertension  (ICD-401.9)  Medications Prior to Update: 1)  Cyanocobalamin 1000 Mcg/ml Soln (Cyanocobalamin) .... 1.5 Ml Every 3 Weeks 2)  Diphenoxylate-Atropine 2.5-0.025 Mg Tabs (Diphenoxylate-Atropine) .... As Needed 3)  Nadolol 20 Mg Tabs (  Nadolol) .... 1/2 Every Day 4)  Losartan Potassium 50 Mg Tabs (Losartan Potassium) .... One By Mouth Daily 5)  Indapamide 1.25 Mg Tabs (Indapamide) .... 1/ 2 Once Daily 6)  Centrum Silver  Tabs (Multiple Vitamins-Minerals) .... One Tablet By Mouth Once Daily 7)  Fish Oil   Oil (Fish Oil) .... One Tablet By Mouth Once Daily 8)  Tylenol Extra Strength 500 Mg Tabs (Acetaminophen) .... As Needed For Pain 9)  Xifaxan 550 Mg .... Take One Tablet  By Mouth Two Times A Day 10)  Phillips Colon Health  Caps (Probiotic Product) .[name] Kitchen.. 1 By Mouth Qd 11)  Propoxyphene N-Apap 100-325 Mg Tabs (Propoxyphene N-Apap) .... One By Mouth Q 4-6 Hours Prn 12)  Clindamycin Phos-Benzoyl Perox 1-5 % Gel (Clindamycin Phos-Benzoyl Perox) .... Apply To Face Daily After Washing 13)  Astepro 0.15 % Soln (Azelastine Hcl) .... Two Spray in Each Nostril Daily 14)  Cvs Allergy Relief 10 Mg Tabs (Loratadine) .... One Tablet By Mouth Once Daily 15)  Bromfed Dm 30-2-10 Mg/75ml Syrp (Pseudoeph-Bromphen-Dm) .... 2 Teaspoons Q 6 Hours As Needed  Current Medications (verified): 1)  Cyanocobalamin 1000 Mcg/ml Soln (Cyanocobalamin) .... 1.5 Ml Every 3 Weeks 2)  Diphenoxylate-Atropine 2.5-0.025 Mg Tabs (Diphenoxylate-Atropine) .... As Needed 3)  Nadolol 20 Mg Tabs (Nadolol) .... 1/2 Every Day 4)  Losartan Potassium 50 Mg Tabs (Losartan Potassium) .... One By Mouth Daily 5)  Indapamide 1.25 Mg Tabs (Indapamide) .... 1/ 2 Once Daily 6)  Centrum Silver  Tabs (Multiple Vitamins-Minerals) .... One Tablet By Mouth Once Daily 7)  Fish Oil   Oil (Fish Oil) .... One Tablet By Mouth Once Daily 8)  Tylenol Extra Strength 500 Mg Tabs (Acetaminophen) .... As Needed For Pain 9)  Xifaxan 550 Mg .... Take One Tablet By Mouth Two Times A Day 10)  Phillips Colon Health  Caps (Probiotic Product) .[name] Kitchen.. 1 By Mouth Qd 11)  Propoxyphene N-Apap 100-325 Mg Tabs (Propoxyphene N-Apap) .... One By Mouth Q 4-6 Hours Prn 12)  Clindamycin Phos-Benzoyl Perox 1-5 % Gel (Clindamycin Phos-Benzoyl Perox) .... Apply To Face Daily After Washing 13)  Astepro 0.15 % Soln (Azelastine Hcl) .... Two Spray in Each Nostril Daily 14)  [facility]vs Allergy Relief 10 Mg Tabs (Loratadine) .... One Tablet By Mouth Once Daily  Allergies (verified): 1)  ! Lipitor (Atorvastatin Calcium) 2)  ! * Statins  ( Crestor) 3)  Prednisone Intensol (Prednisone) 4)  Amoxicillin (Amoxicillin) 5)  Cellcept (Mycophenolate Mofetil) 6)   Cipro  Past History:  Family History: Last updated: 05/01/2009 n/c No FH of Colon Cancer:  Social History: Last updated: 05/01/2009 Retired Married Former Smoker Alcohol Use - no Illicit Drug Use - no Patient does not get regular exercise.  Daily Caffeine Use: occ  Risk Factors: Exercise: no (05/01/2009)  Risk Factors: Smoking Status: quit (12/31/2009) Packs/Day: 0.75 (12/31/2009)  Past medical, surgical, family and social histories (including risk factors) reviewed, and no changes noted (except as noted below).  Past Medical History: Reviewed history from 05/01/2009 and no changes required. Current Problems:  RLQ PAIN (ICD-789.03) COLITIS (ICD-558.9) ESOPHAGEAL STRICTURE (ICD-530.3) GASTRITIS, CHRONIC (ICD-535.10) COLONIC POLYPS, HX OF (ICD-V12.72) DIVERTICULOSIS, COLON (ICD-562.10) ACTINIC KERATOSIS, HEAD (ICD-702.0) EYE FLOATERS (ICD-379.24) NECK PAIN (ICD-723.1) ESOPHAGEAL REFLUX (ICD-530.81) ECCHYMOSES, SPONTANEOUS (ICD-782.7) CHRONIC MAXILLARY SINUSITIS (ICD-473.0) BACK PAIN WITH RADICULOPATHY (ICD-729.2) HYPERLIPIDEMIA (ICD-272.4) R/O MULTIPLE  MYELOMA (ICD-203.00) DERMATITIS, ATOPIC (ICD-691.8) IRRITABLE BOWEL SYNDROME (ICD-564.1) NEUROPATHY, IDIOPATHIC PERIPHERAL NEC (ICD-356.8) B12 DEFICIENCY (ICD-266.2) KIDNEY DISEASE (ICD-593.9) ALLERGIC RHINITIS (ICD-477.9) HYPERTENSION (ICD-401.9)  Past Surgical  History: Reviewed history from 08/11/2007 and no changes required. Inguinal herniorrhaphy Lumbar laminectomy Lumbar fusion Tonsillectomy  Family History: Reviewed history from 05/01/2009 and no changes required. n/c No FH of Colon Cancer:  Social History: Reviewed history from 05/01/2009 and no changes required. Retired Married Former Smoker Alcohol Use - no Illicit Drug Use - no Patient does not get regular exercise.  Daily Caffeine Use: occ  Review of Systems  The patient denies anorexia, fever, weight loss, weight gain, vision  loss, decreased hearing, hoarseness, chest pain, syncope, dyspnea on exertion, peripheral edema, prolonged cough, headaches, hemoptysis, abdominal pain, melena, hematochezia, severe indigestion/heartburn, hematuria, incontinence, genital sores, muscle weakness, suspicious skin lesions, transient blindness, difficulty walking, depression, unusual weight change, abnormal bleeding, enlarged lymph nodes, angioedema, breast masses, and testicular masses.    Physical Exam  General:  Well-developed,well-nourished,in no acute distress; alert,appropriate and cooperative throughout examination Head:  Normocephalic and atraumatic. Eyes:  PERRLA, no icterus.exam deferred to patient's ophthalmologist.   Ears:  R ear normal and L ear normal.   Nose:  nasal dischargemucosal pallor, mucosal erythema, and mucosal edema.   Neck:  No deformities, masses, or tenderness noted. Lungs:  Normal respiratory effort, chest expands symmetrically. Lungs are clear to auscultation, no crackles or wheezes. Heart:  Normal rate and regular rhythm. S1 and S2 normal without gallop, murmur, click, rub or other extra sounds. Abdomen:  Soft, nontender and nondistended. No masses, hepatosplenomegaly or hernias noted. Normal bowel sounds.I cannot appreciate any inguinal hernias or other abdominal wall abnormalities. Msk:  no joint swelling and no joint warmth.   Extremities:  No clubbing, cyanosis, edema or deformities noted. Neurologic:  Alert and  oriented x4;  grossly normal neurologically.   Impression & Recommendations:  Problem # 1:  ABDOMINAL PAIN OTHER SPECIFIED SITE (ICD-789.09) discussion of the divserticuli His updated medication list for this problem includes:    Tylenol Extra Strength 500 Mg Tabs (Acetaminophen) .[name] Kitchen... As needed for pain    Propoxyphene N-apap 100-325 Mg Tabs (Propoxyphene n-apap) ..... One by mouth q 4-6 hours prn  Discussed use of medications, application of heat or cold, and exercises.   Problem #  2:  HYPERLIPIDEMIA (ICD-272.4)  Labs Reviewed: SGOT: 23 (10/30/2009)   SGPT: 14 (10/30/2009)  Prior 10 Yr Risk Heart Disease: Not enough information (03/27/2009)   HDL:33.90 (03/27/2009), 74.0 (02/15/2008)  LDL:DEL (02/15/2008)  Chol:256 (03/27/2009), 419 (02/15/2008)  Trig:278 (02/15/2008)  Problem # 3:  IRRITABLE BOWEL SYNDROME (ICD-564.1) stable  Problem # 4:  CHRONIC MAXILLARY SINUSITIS (ICD-473.0)  stable The following medications were removed from the medication list:    Bromfed Dm 30-2-10 Mg/19ml Syrp (Pseudoeph-bromphen-dm) .[name] Kitchen... 2 teaspoons q 6 hours as needed His updated medication list for this problem includes:    Astepro 0.15 % Soln (Azelastine hcl) .[name] Kitchen..[name] Kitchen Two spray in each nostril daily  Take antibiotics for full duration. Discussed treatment options including indications for coronal CT scan of sinuses and ENT referral.   Complete Medication List: 1)  Cyanocobalamin 1000 Mcg/ml Soln (Cyanocobalamin) .... 1.5 ml every 3 weeks 2)  Diphenoxylate-atropine 2.5-0.025 Mg Tabs (Diphenoxylate-atropine) .... As needed 3)  Nadolol 20 Mg Tabs (Nadolol) .... 1/2 every day 4)  Losartan Potassium 50 Mg Tabs (Losartan potassium) .... One by mouth daily 5)  Indapamide 1.25 Mg Tabs (Indapamide) .... 1/ 2 once daily 6)  Centrum Silver Tabs (Multiple vitamins-minerals) .... One tablet by mouth once daily 7)  Fish Oil Oil (Fish oil) .... One tablet by mouth once daily 8)  Tylenol Extra  Strength 500 Mg Tabs (Acetaminophen) .... As needed for pain 9)  Xifaxan 550 Mg  .... Take one tablet by mouth two times a day 10)  Phillips Colon Health Caps (Probiotic product) .[name] Kitchen.. 1 by mouth qd 11)  Propoxyphene N-apap 100-325 Mg Tabs (Propoxyphene n-apap) .... One by mouth q 4-6 hours prn 12)  Clindamycin Phos-benzoyl Perox 1-5 % Gel (Clindamycin phos-benzoyl perox) .... Apply to face daily after washing 13)  Astepro 0.15 % Soln (Azelastine hcl) .... Two spray in each nostril daily 14)  [facility]vs Allergy  Relief 10 Mg Tabs (Loratadine) .... One tablet by mouth once daily 15)  Lodrane 12 Hour 6 Mg Xr12h-tab (Brompheniramine maleate) .... One by mouth bid  Other Orders: Vit B12 1000 mcg (J3420) Admin of Therapeutic Inj  intramuscular or subcutaneous (47829)  Hypertension Assessment/Plan:      The patient's hypertensive risk group is category B: At least one risk factor (excluding diabetes) with no target organ damage.  Today's blood pressure is 140/80.  His blood pressure goal is < 125/75.  Patient Instructions: 1)  Please schedule a follow-up appointment in 3 months. Prescriptions: LODRANE 12 HOUR 6 MG XR12H-TAB (BROMPHENIRAMINE MALEATE) one by mouth BID  #30 x 0   Entered and Authorized by:   [name] MD   Signed by:   [name] MD on 12/31/2009   Method used:   Electronically to        [facility] [id number]* (retail)       [address] [place], [place]  [address]       Ph: [phone]       Fax: [phone]   RxID:   [id number]      Medication Administration  Injection # 1:    Medication: Vit B12 1000 mcg    Diagnosis: NEUROPATHY, IDIOPATHIC PERIPHERAL NEC (ICD-356.8)    Route: IM    Site: L deltoid    Exp Date: 08/16/2011    Lot #: [id number]    Mfr: American Regent    Patient tolerated injection without complications    Given by: Willy Eddy, LPN (December 31, 2009 11:56 AM)  Orders Added: 1)  Vit B12 1000 mcg [J3420] 2)  Admin of Therapeutic Inj  intramuscular or subcutaneous [96372] 3)  Est. Patient Level IV [75643]

## 2010-12-17 NOTE — Assessment & Plan Note (Signed)
Summary: b12 inj/njr   Nurse Visit     Allergies: 1)  ! Lipitor (Atorvastatin Calcium) 2)  ! * Statins  ( Crestor) 3)  Prednisone Intensol (Prednisone) 4)  Amoxicillin (Amoxicillin) 5)  Cellcept (Mycophenolate Mofetil) 6)  Cipro     Medication Administration  Injection # 1:    Medication: Vit B12 1000 mcg    Diagnosis: B12 DEFICIENCY (ICD-266.2)    Route: IM    Site: R deltoid    Exp Date: 07/18/2010    Lot #: [id number]    Mfr: American Regent    Patient tolerated injection without complications    Given by: Willy Eddy, LPN (January 29, 2009 12:28 PM)  Orders Added: 1)  Admin of Therapeutic Inj  intramuscular or subcutaneous [96372] 2)  Vit B12 1000 mcg [email or web address]    ]

## 2010-12-17 NOTE — Assessment & Plan Note (Signed)
Summary: b 12 inj/bonnye/mhf   Nurse Visit    Prior Medications: CYANOCOBALAMIN 1000 MCG/ML SOLN (CYANOCOBALAMIN) 1.5 ml every 3 weeks DIPHENOXYLATE-ATROPINE 2.5-0.025 MG TABS (DIPHENOXYLATE-ATROPINE) as needed LONGS ONE DAILY MULTI VITAMIN  TABS (MULTIPLE VITAMIN) Take 1 tablet by mouth once a day NADOLOL 20 MG TABS (NADOLOL) 1/2 every day HYZAAR 50-12.5 MG  TABS (LOSARTAN POTASSIUM-HCTZ) once daily BETAMETHASONE DIPROPIONATE AUG 0.05 %  OINT (AUG BETAMETHASONE DIPROPIONATE) apply to rash FLONASE 50 MCG/ACT  SUSP (FLUTICASONE PROPIONATE) one squirt R & L nostril bid [facility] LORATADINE 10 MG  TABS (LORATADINE) 1 once daily Current Allergies: ! LIPITOR (ATORVASTATIN CALCIUM) ! * STATINS  ( CRESTOR) PREDNISONE INTENSOL (PREDNISONE) AMOXICILLIN (AMOXICILLIN) CELLCEPT (MYCOPHENOLATE MOFETIL) CIPRO    Medication Administration  Injection # 1:    Medication: Vit B12 1000 mcg    Diagnosis: B12 DEFICIENCY (ICD-266.2)    Route: IM    Site: L deltoid    Exp Date: 09/17/2009    Lot #: [id number]    Mfr: American Regent    Comments: given 1.03/1499 micrograms of b 12    Patient tolerated injection without complications    Given by: Willy Eddy, LPN (July 04, 2008 12:18 PM)  Orders Added: 1)  Vit B12 1000 mcg [J3420] 2)  Admin of Therapeutic Inj  intramuscular or subcutaneous [email or web address]    ]

## 2010-12-19 NOTE — Assessment & Plan Note (Signed)
Summary: 3 month fup//ccm   Vital Signs:  Patient profile:   75 year old male Height:      72 inches Weight:      200 pounds BMI:     27.22 Temp:     98.2 degrees F oral Pulse rate:   68 / minute Resp:     14 per minute BP sitting:   136 / 80  (left arm)  Vitals Entered By: Willy Eddy, LPN (November 04, 2010 9:49 AM) CC: roa labs, Hypertension Management, Lipid Management Is Patient Diabetic? No   Primary Care Provider:  Darryll Capers, MD  CC:  roa labs, Hypertension Management, and Lipid Management.  History of Present Illness: The patient has a hx of radicular pain back pain worsened and he took a hot bath that  helped the back pain and relieved the radicular symptoms. Possible chiopractor consume yogurt and the digestive tract has improved Dr Clydene Pugh is  his new GU the fish oil and the red rice yeast is working  Hypertension History:      He denies headache, chest pain, palpitations, dyspnea with exertion, orthopnea, PND, peripheral edema, visual symptoms, neurologic problems, syncope, and side effects from treatment.        Positive major cardiovascular risk factors include male age 53 years old or older, hyperlipidemia, and hypertension.  Negative major cardiovascular risk factors include non-tobacco-user status.    Lipid Management History:      Positive NCEP/ATP III risk factors include male age 25 years old or older, HDL cholesterol less than 40, and hypertension.  Negative NCEP/ATP III risk factors include non-tobacco-user status.      Preventive Screening-Counseling & Management  Alcohol-Tobacco     Smoking Status: quit     Packs/Day: 0.75     Year Started: 1964     Year Quit: 2003  Problems Prior to Update: 1)  Osteoarthritis, Wrist, Right  (ICD-715.13) 2)  Acute Maxillary Sinusitis  (ICD-461.0) 3)  Gastroenteritis, Viral  (ICD-008.8) 4)  Acne Rosacea  (ICD-695.3) 5)  Chest Wall Pain, Acute  (ICD-786.52) 6)  Abdominal Pain Other Specified Site   (ICD-789.09) 7)  Elevated Sedimentation Rate  (ICD-790.1) 8)  Rlq Pain  (ICD-789.03) 9)  Colitis  (ICD-558.9) 10)  Esophageal Stricture  (ICD-530.3) 11)  Gastritis, Chronic  (ICD-535.10) 12)  Colonic Polyps, Hx of  (ICD-V12.72) 13)  Diverticulosis, Colon  (ICD-562.10) 14)  Actinic Keratosis, Head  (ICD-702.0) 15)  Eye Floaters  (ICD-379.24) 16)  Neck Pain  (ICD-723.1) 17)  Ecchymoses, Spontaneous  (ICD-782.7) 18)  Chronic Maxillary Sinusitis  (ICD-473.0) 19)  Back Pain With Radiculopathy  (ICD-729.2) 20)  Hyperlipidemia  (ICD-272.4) 21)  Dermatitis, Atopic  (ICD-691.8) 22)  Irritable Bowel Syndrome  (ICD-564.1) 23)  Neuropathy, Idiopathic Peripheral Nec  (ICD-356.8) 24)  B12 Deficiency  (ICD-266.2) 25)  Kidney Disease  (ICD-593.9) 26)  Allergic Rhinitis  (ICD-477.9) 27)  Hypertension  (ICD-401.9)  Current Problems (verified): 1)  Osteoarthritis, Wrist, Right  (ICD-715.13) 2)  Acute Maxillary Sinusitis  (ICD-461.0) 3)  Gastroenteritis, Viral  (ICD-008.8) 4)  Acne Rosacea  (ICD-695.3) 5)  Chest Wall Pain, Acute  (ICD-786.52) 6)  Abdominal Pain Other Specified Site  (ICD-789.09) 7)  Elevated Sedimentation Rate  (ICD-790.1) 8)  Rlq Pain  (ICD-789.03) 9)  Colitis  (ICD-558.9) 10)  Esophageal Stricture  (ICD-530.3) 11)  Gastritis, Chronic  (ICD-535.10) 12)  Colonic Polyps, Hx of  (ICD-V12.72) 13)  Diverticulosis, Colon  (ICD-562.10) 14)  Actinic Keratosis, Head  (ICD-702.0) 15)  Eye  Floaters  (ICD-379.24) 16)  Neck Pain  (ICD-723.1) 17)  Ecchymoses, Spontaneous  (ICD-782.7) 18)  Chronic Maxillary Sinusitis  (ICD-473.0) 19)  Back Pain With Radiculopathy  (ICD-729.2) 20)  Hyperlipidemia  (ICD-272.4) 21)  Dermatitis, Atopic  (ICD-691.8) 22)  Irritable Bowel Syndrome  (ICD-564.1) 23)  Neuropathy, Idiopathic Peripheral Nec  (ICD-356.8) 24)  B12 Deficiency  (ICD-266.2) 25)  Kidney Disease  (ICD-593.9) 26)  Allergic Rhinitis  (ICD-477.9) 27)  Hypertension   (ICD-401.9)  Medications Prior to Update: 1)  Cyanocobalamin 1000 Mcg/ml Soln (Cyanocobalamin) .... 1.5 Ml Every 3 Weeks 2)  Diphenoxylate-Atropine 2.5-0.025 Mg Tabs (Diphenoxylate-Atropine) .... As Needed 3)  Nadolol 20 Mg Tabs (Nadolol) .... 1/2 Every Day 4)  Losartan Potassium 50 Mg Tabs (Losartan Potassium) .... One By Mouth Daily 5)  Indapamide 1.25 Mg Tabs (Indapamide) .... 1/ 2 Once Daily 6)  Centrum Silver  Tabs (Multiple Vitamins-Minerals) .... One Tablet By Mouth Once Daily 7)  Fish Oil   Oil (Fish Oil) .... Two Two Times A Day 8)  Tylenol Extra Strength 500 Mg Tabs (Acetaminophen) .... As Needed For Pain 9)  Clindamycin Phos-Benzoyl Perox 1-5 % Gel (Clindamycin Phos-Benzoyl Perox) .... Apply To Face Daily After Washing 10)  Astepro 0.15 % Soln (Azelastine Hcl) .... Two Spray in Each Nostril Daily 11)  Cvs Allergy Relief 10 Mg Tabs (Loratadine) .... One Tablet By Mouth Once Daily 12)  Red Yeast Rice 600 Mg Tabs (Red Yeast Rice Extract) .... Two Two Times A Day 13)  Pennsaid 1.5 % Soln (Diclofenac Sodium) .... Apply To Wrsut As Directed  Current Medications (verified): 1)  Cyanocobalamin 1000 Mcg/ml Soln (Cyanocobalamin) .... 1.5 Ml Every 3 Weeks 2)  Diphenoxylate-Atropine 2.5-0.025 Mg Tabs (Diphenoxylate-Atropine) .... As Needed 3)  Nadolol 20 Mg Tabs (Nadolol) .... 1/2 Every Day 4)  Losartan Potassium 50 Mg Tabs (Losartan Potassium) .... One By Mouth Daily 5)  Indapamide 1.25 Mg Tabs (Indapamide) .... 1/ 2 Once Daily 6)  Centrum Silver  Tabs (Multiple Vitamins-Minerals) .... One Tablet By Mouth Once Daily 7)  Fish Oil   Oil (Fish Oil) .... Two Two Times A Day 8)  Tylenol Extra Strength 500 Mg Tabs (Acetaminophen) .... As Needed For Pain 9)  Clindamycin Phos-Benzoyl Perox 1-5 % Gel (Clindamycin Phos-Benzoyl Perox) .... Apply To Face Daily After Washing 10)  Astepro 0.15 % Soln (Azelastine Hcl) .... Two Spray in Each Nostril Daily 11)  Cvs Allergy Relief 10 Mg Tabs (Loratadine)  .... One Tablet By Mouth Once Daily 12)  Red Yeast Rice 600 Mg Tabs (Red Yeast Rice Extract) .... Two Two Times A Day  Allergies (verified): 1)  ! Lipitor (Atorvastatin Calcium) 2)  ! * Statins  ( Crestor) 3)  Prednisone Intensol (Prednisone) 4)  Amoxicillin (Amoxicillin) 5)  Cellcept (Mycophenolate Mofetil) 6)  Cipro (Ciprofloxacin)  Past History:  Family History: Last updated: 05/01/2009 n/c No FH of Colon Cancer:  Social History: Last updated: 05/01/2009 Retired Married Former Smoker Alcohol Use - no Illicit Drug Use - no Patient does not get regular exercise.  Daily Caffeine Use: occ  Risk Factors: Exercise: no (05/01/2009)  Risk Factors: Smoking Status: quit (11/04/2010) Packs/Day: 0.75 (11/04/2010)  Past medical, surgical, family and social histories (including risk factors) reviewed, and no changes noted (except as noted below).  Past Medical History: Reviewed history from 05/01/2009 and no changes required. Current Problems:  RLQ PAIN (ICD-789.03) COLITIS (ICD-558.9) ESOPHAGEAL STRICTURE (ICD-530.3) GASTRITIS, CHRONIC (ICD-535.10) COLONIC POLYPS, HX OF (ICD-V12.72) DIVERTICULOSIS, COLON (ICD-562.10) ACTINIC KERATOSIS, HEAD (ICD-702.0)  EYE FLOATERS (ICD-379.24) NECK PAIN (ICD-723.1) ESOPHAGEAL REFLUX (ICD-530.81) ECCHYMOSES, SPONTANEOUS (ICD-782.7) CHRONIC MAXILLARY SINUSITIS (ICD-473.0) BACK PAIN WITH RADICULOPATHY (ICD-729.2) HYPERLIPIDEMIA (ICD-272.4) R/O MULTIPLE  MYELOMA (ICD-203.00) DERMATITIS, ATOPIC (ICD-691.8) IRRITABLE BOWEL SYNDROME (ICD-564.1) NEUROPATHY, IDIOPATHIC PERIPHERAL NEC (ICD-356.8) B12 DEFICIENCY (ICD-266.2) KIDNEY DISEASE (ICD-593.9) ALLERGIC RHINITIS (ICD-477.9) HYPERTENSION (ICD-401.9)  Past Surgical History: Reviewed history from 08/11/2007 and no changes required. Inguinal herniorrhaphy Lumbar laminectomy Lumbar fusion Tonsillectomy  Family History: Reviewed history from 05/01/2009 and no changes  required. n/c No FH of Colon Cancer:  Social History: Reviewed history from 05/01/2009 and no changes required. Retired Married Former Smoker Alcohol Use - no Illicit Drug Use - no Patient does not get regular exercise.  Daily Caffeine Use: occ  Review of Systems  The patient denies anorexia, fever, weight loss, weight gain, vision loss, decreased hearing, hoarseness, chest pain, syncope, dyspnea on exertion, peripheral edema, prolonged cough, headaches, hemoptysis, abdominal pain, melena, hematochezia, severe indigestion/heartburn, hematuria, incontinence, genital sores, muscle weakness, suspicious skin lesions, transient blindness, difficulty walking, depression, unusual weight change, abnormal bleeding, enlarged lymph nodes, angioedema, breast masses, and testicular masses.    Physical Exam  General:  Well-developed,well-nourished,in no acute distress; alert,appropriate and cooperative throughout examination Head:  Normocephalic and atraumatic. Eyes:  PERRLA, no icterus.exam deferred to patient's ophthalmologist.   Ears:  R ear normal and L ear normal.   Nose:  no external deformity and no nasal discharge.   Mouth:  good dentition and pharynx pink and moist.   Neck:  No deformities, masses, or tenderness noted. Lungs:  Normal respiratory effort, chest expands symmetrically. Lungs are clear to auscultation, no crackles or wheezes. Heart:  Normal rate and regular rhythm. S1 and S2 normal without gallop, murmur, click, rub or other extra sounds. Abdomen:  soft, non-tender, and bowel sounds hyperactive.     Impression & Recommendations:  Problem # 1:  GASTRITIS, CHRONIC (ICD-535.10) Assessment Unchanged yogurt helping Discussed use of medication, as well as lifestyle changes.   Problem # 2:  HYPERLIPIDEMIA (ICD-272.4) Assessment: Unchanged imporved Labs Reviewed: SGOT: 20 (10/28/2010)   SGPT: 14 (10/28/2010)  Lipid Goals: Chol Goal: 200 (02/12/2010)   HDL Goal: 40  (02/12/2010)   LDL Goal: 130 (02/12/2010)   TG Goal: 150 (02/12/2010)  10 Yr Risk Heart Disease: 33 % Prior 10 Yr Risk Heart Disease: Not enough information (03/27/2009)   HDL:34.20 (10/28/2010), 38.10 (07/29/2010)  LDL:126 (10/28/2010), DEL (02/15/2008)  Chol:184 (10/28/2010), 217 (07/29/2010)  Trig:120.0 (10/28/2010), 132.0 (07/29/2010)  Problem # 3:  ALLERGIC RHINITIS (ICD-477.9)  His updated medication list for this problem includes:    Astepro 0.15 % Soln (Azelastine hcl) .[name] Kitchen..[name] Kitchen Two spray in each nostril daily    Cvs Allergy Relief 10 Mg Tabs (Loratadine) ..... One tablet by mouth once daily  Discussed use of allergy medications and environmental measures.   Problem # 4:  HYPERTENSION (ICD-401.9)  His updated medication list for this problem includes:    Nadolol 20 Mg Tabs (Nadolol) .[name] Kitchen... 1/2 every day    Losartan Potassium 50 Mg Tabs (Losartan potassium) ..... One by mouth daily    Indapamide 1.25 Mg Tabs (Indapamide) .[name] Kitchen... 1/ 2 once daily  BP today: 136/80 Prior BP: 124/70 (08/05/2010)  10 Yr Risk Heart Disease: 33 % Prior 10 Yr Risk Heart Disease: Not enough information (03/27/2009)  Labs Reviewed: K+: 5.1 (02/12/2010) Creat: : 0.8 (02/12/2010)   Chol: 184 (10/28/2010)   HDL: 34.20 (10/28/2010)   LDL: 126 (10/28/2010)   TG: 120.0 (10/28/2010)  Complete Medication List: 1)  Cyanocobalamin  1000 Mcg/ml Soln (Cyanocobalamin) .... 1.5 ml every 3 weeks 2)  Diphenoxylate-atropine 2.5-0.025 Mg Tabs (Diphenoxylate-atropine) .... As needed 3)  Nadolol 20 Mg Tabs (Nadolol) .... 1/2 every day 4)  Losartan Potassium 50 Mg Tabs (Losartan potassium) .... One by mouth daily 5)  Indapamide 1.25 Mg Tabs (Indapamide) .... 1/ 2 once daily 6)  Centrum Silver Tabs (Multiple vitamins-minerals) .... One tablet by mouth once daily 7)  Fish Oil Oil (Fish oil) .... Two two times a day 8)  Tylenol Extra Strength 500 Mg Tabs (Acetaminophen) .... As needed for pain 9)  Clindamycin Phos-benzoyl Perox  1-5 % Gel (Clindamycin phos-benzoyl perox) .... Apply to face daily after washing 10)  Astepro 0.15 % Soln (Azelastine hcl) .... Two spray in each nostril daily 11)  Cvs Allergy Relief 10 Mg Tabs (Loratadine) .... One tablet by mouth once daily 12)  Red Yeast Rice 600 Mg Tabs (Red yeast rice extract) .... Two two times a day  Hypertension Assessment/Plan:      The patient's hypertensive risk group is category B: At least one risk factor (excluding diabetes) with no target organ damage.  His calculated 10 year risk of coronary heart disease is 33 %.  Today's blood pressure is 136/80.  His blood pressure goal is < 125/75.  Lipid Assessment/Plan:      Based on NCEP/ATP III, the patient's risk factor category is "0-1 risk factors".  The patient's lipid goals are as follows: Total cholesterol goal is 200; LDL cholesterol goal is 130; HDL cholesterol goal is 40; Triglyceride goal is 150.    Patient Instructions: 1)  Please schedule a follow-up appointment in 3 months. Prescriptions: DIPHENOXYLATE-ATROPINE 2.5-0.025 MG TABS (DIPHENOXYLATE-ATROPINE) as needed  #30 x 3   Entered and Authorized by:   [name] MD   Signed by:   [name] MD on 11/04/2010   Method used:   Print then Give to Patient   RxID:   [id number]    Orders Added: 1)  Est. Patient Level IV [69629]

## 2010-12-19 NOTE — Assessment & Plan Note (Signed)
Summary: B12 INJ // RS   Nurse Visit   Allergies: 1)  ! Lipitor (Atorvastatin Calcium) 2)  ! * Statins  ( Crestor) 3)  Prednisone Intensol (Prednisone) 4)  Amoxicillin (Amoxicillin) 5)  Cellcept (Mycophenolate Mofetil) 6)  Cipro (Ciprofloxacin)

## 2010-12-19 NOTE — Assessment & Plan Note (Signed)
Summary: b-12 inj/cjr   Nurse Visit   Allergies: 1)  ! Lipitor (Atorvastatin Calcium) 2)  ! * Statins  ( Crestor) 3)  Prednisone Intensol (Prednisone) 4)  Amoxicillin (Amoxicillin) 5)  Cellcept (Mycophenolate Mofetil) 6)  Cipro (Ciprofloxacin)  Medication Administration  Injection # 1:    Medication: Vit B12 1000 mcg    Diagnosis: B12 DEFICIENCY (ICD-266.2)    Route: IM    Site: L deltoid    Exp Date: 06/16/2012    Lot #: [id number]    Mfr: [employer] Regent    Patient tolerated injection without complications    Given by: Willy Eddy, LPN (December 10, 2010 11:59 AM)  Orders Added: 1)  Vit B12 1000 mcg [J3420] 2)  Admin of Therapeutic Inj  intramuscular or subcutaneous [16109]

## 2010-12-19 NOTE — Progress Notes (Signed)
Summary: sinus inf  Phone Note Call from Patient Call back at Home Phone [phone]   Caller: Patient Call For: [name] MD Summary of Call: Pt has sinus inf. [facility] [address] [phone] Initial call taken by: Heron Sabins,  November 21, 2010 9:09 AM  Follow-up for Phone Call        zpack Follow-up by: [name] MD,  November 21, 2010 1:00 PM    New/Updated Medications: ZITHROMAX Z-PAK 250 MG TABS (AZITHROMYCIN) take as directed Prescriptions: ZITHROMAX Z-PAK 250 MG TABS (AZITHROMYCIN) take as directed  #1 x 0   Entered by:   Willy Eddy, LPN   Authorized by:   [name] MD   Signed by:   Willy Eddy, LPN on 09/81/1914   Method used:   Electronically to        [facility] [id number] (retail)       [address] [place], [place]  [address]       Ph: [phone]       Fax: [phone]   RxID:   [id number]

## 2010-12-24 ENCOUNTER — Encounter: Payer: Self-pay | Admitting: Internal Medicine

## 2010-12-24 ENCOUNTER — Ambulatory Visit (INDEPENDENT_AMBULATORY_CARE_PROVIDER_SITE_OTHER): Payer: Medicare Other | Admitting: Internal Medicine

## 2010-12-24 VITALS — BP 130/80 | HR 72 | Temp 97.8°F | Resp 14 | Ht 73.0 in | Wt 202.0 lb

## 2010-12-24 DIAGNOSIS — R519 Headache, unspecified: Secondary | ICD-10-CM

## 2010-12-24 DIAGNOSIS — J0110 Acute frontal sinusitis, unspecified: Secondary | ICD-10-CM

## 2010-12-24 DIAGNOSIS — R51 Headache: Secondary | ICD-10-CM

## 2010-12-24 MED ORDER — AZITHROMYCIN 250 MG PO TABS: ORAL_TABLET | ORAL | Status: AC

## 2010-12-24 MED ORDER — PHENYLEPHRINE-APAP-GUAIFENESIN 10-650-400 MG/20ML PO LIQD: 5.0000 mL | Freq: Four times a day (QID) | ORAL | Status: DC | PRN

## 2010-12-24 NOTE — Progress Notes (Signed)
  Subjective:    Patient ID: Timothy Gallagher, male    DOB: 10-May-1936, 75 y.o.   MRN: [id number]  Sinusitis This is a recurrent problem. The current episode started yesterday. The problem has been gradually worsening since onset. There has been no fever. The fever has been present for less than 1 day. His pain is at a severity of 8/10. Associated symptoms include congestion, headaches, sinus pressure and sneezing. Pertinent negatives include no coughing, diaphoresis, ear pain, hoarse voice, neck pain, shortness of breath or sore throat. Past treatments include spray decongestants. The treatment provided no relief.  Headache  This is a new problem. The current episode started yesterday. The problem occurs constantly. The problem has been unchanged. The pain is located in the frontal region. The pain does not radiate. The pain quality is not similar to prior headaches. The quality of the pain is described as aching. The pain is at a severity of 7/10. The pain is moderate. Associated symptoms include sinus pressure. Pertinent negatives include no coughing, ear pain, neck pain or sore throat. The symptoms are aggravated by coughing and sneezing. He has tried acetaminophen for the symptoms. The treatment provided mild relief. His past medical history is significant for sinus disease.      Review of Systems  Constitutional: Negative for diaphoresis.  HENT: Positive for congestion, sneezing and sinus pressure. Negative for ear pain, sore throat, hoarse voice and neck pain.   Respiratory: Negative for cough and shortness of breath.   Neurological: Positive for headaches.      the patient's past medical history social history allergies medication list surgical history and problem list were reviewed Objective:   Physical Exam  Constitutional: He appears well-developed.  HENT:       Nasal turbinates were swollen red and erythematous with discharge oropharynx showed posterior cobblestoning with  erythematous trails and postnasal drip neck was supple without adenopathy or lymphadenopathy  Eyes: Conjunctivae and EOM are normal. Pupils are equal, round, and reactive to light.  Neck: Normal range of motion. Neck supple.  Pulmonary/Chest: Effort normal and breath sounds normal.  Abdominal: Soft. Bowel sounds are normal.  Neurological: A cranial nerve deficit is present.          Assessment & Plan:  Acute on chronic sinusitis a Z-Pak and Mucinex Fosamax cold and sinus was sent in to his pharmacy.  Nasal lavage was suggested with normal saline and if the symptoms persist patient was instructed to call back for sinus limited CT scan to be obtained. Patient sinusitis appears to be moving from acute to chronic diagnosis and an ear nose and throat consultation was discussed with the patient.

## 2010-12-26 ENCOUNTER — Telehealth: Payer: Self-pay | Admitting: *Deleted

## 2010-12-26 NOTE — Telephone Encounter (Signed)
Pt is complaining that he still has a severe headache and sinus pressure.  Is taking all meds he was given.

## 2010-12-26 NOTE — Telephone Encounter (Signed)
Notified pt of Dr. [name] recommendations.

## 2010-12-26 NOTE — Telephone Encounter (Signed)
It is only been 2 days he needs to give the antibiotics more time into the headache is severe he could take Aleve 2 tablets by mouth twice daily with food and use a warm compress to to his face. He may also consider using Afrin for 3 days but not to exceed 3  days

## 2010-12-31 ENCOUNTER — Ambulatory Visit (INDEPENDENT_AMBULATORY_CARE_PROVIDER_SITE_OTHER): Payer: Medicare Other | Admitting: Internal Medicine

## 2010-12-31 DIAGNOSIS — E538 Deficiency of other specified B group vitamins: Secondary | ICD-10-CM

## 2011-01-01 DIAGNOSIS — E538 Deficiency of other specified B group vitamins: Secondary | ICD-10-CM

## 2011-01-01 MED ORDER — CYANOCOBALAMIN 1000 MCG/ML IJ SOLN
1000.0000 ug | Freq: Once | INTRAMUSCULAR | Status: AC
  Administered 2011-01-01: 1000 ug via INTRAMUSCULAR

## 2011-01-21 ENCOUNTER — Ambulatory Visit (INDEPENDENT_AMBULATORY_CARE_PROVIDER_SITE_OTHER): Payer: Medicare Other | Admitting: Internal Medicine

## 2011-01-21 DIAGNOSIS — E538 Deficiency of other specified B group vitamins: Secondary | ICD-10-CM

## 2011-01-21 MED ORDER — CYANOCOBALAMIN 1000 MCG/ML IJ SOLN
1000.0000 ug | Freq: Once | INTRAMUSCULAR | Status: AC
  Administered 2011-01-21: 1000 ug via INTRAMUSCULAR

## 2011-02-07 ENCOUNTER — Encounter: Payer: Self-pay | Admitting: Internal Medicine

## 2011-02-07 ENCOUNTER — Ambulatory Visit (INDEPENDENT_AMBULATORY_CARE_PROVIDER_SITE_OTHER): Payer: Medicare Other | Admitting: Internal Medicine

## 2011-02-07 VITALS — BP 124/80 | HR 76 | Temp 98.2°F | Resp 14 | Ht 73.0 in | Wt 202.0 lb

## 2011-02-07 DIAGNOSIS — K5289 Other specified noninfective gastroenteritis and colitis: Secondary | ICD-10-CM

## 2011-02-07 DIAGNOSIS — IMO0001 Reserved for inherently not codable concepts without codable children: Secondary | ICD-10-CM

## 2011-02-07 DIAGNOSIS — R1031 Right lower quadrant pain: Secondary | ICD-10-CM

## 2011-02-07 DIAGNOSIS — I10 Essential (primary) hypertension: Secondary | ICD-10-CM

## 2011-02-07 DIAGNOSIS — E785 Hyperlipidemia, unspecified: Secondary | ICD-10-CM

## 2011-02-07 NOTE — Progress Notes (Signed)
Subjective:    Patient ID: Timothy Gallagher, male    DOB: [birth year], 75 y.o.   MRN: [id number]  HPI  this is a 75 year old white male who presents for routine followup of his chronic medical problems of hypertension hyperlipidemia and a history of colitis he states that he is doing well from the standpoint of his blood pressure and his lipids he doesn't follow his diet and compliant with his medication he has had no flare of his colitis he has however had some abdominal wall pain that started after he worked in his yard for several days it is in the mid right anterior wall consistent with the abdominal wall muscles there is no palpable mass there is no liver tenderness no deep tenderness upon percussion or palpation or specific tenderness along the edge of the muscle reproducible.     Review of Systems  Constitutional: Negative for fever and fatigue.  HENT: Negative for hearing loss, congestion, neck pain and postnasal drip.   Eyes: Negative for discharge, redness and visual disturbance.  Respiratory: Negative for cough, shortness of breath and wheezing.   Cardiovascular: Negative for leg swelling.  Gastrointestinal: Positive for abdominal pain. Negative for constipation.  Genitourinary: Negative for urgency and frequency.  Musculoskeletal: Negative for joint swelling and arthralgias.  Skin: Negative for color change and rash.  Neurological: Negative for weakness and light-headedness.  Hematological: Negative for adenopathy.  Psychiatric/Behavioral: Negative for behavioral problems.   Past Medical History  Diagnosis Date  . ACNE ROSACEA 06/27/2009  . ACTINIC KERATOSIS, HEAD 04/18/2009  . Acute maxillary sinusitis 05/14/2010  . ALLERGIC RHINITIS 04/09/2007  . B12 DEFICIENCY 06/07/2007  . BACK PAIN WITH RADICULOPATHY 04/24/2008  . CHEST WALL PAIN, ACUTE 06/15/2009  . Chronic maxillary sinusitis 05/29/2008  . COLITIS 04/27/2009  . COLONIC POLYPS, HX OF 04/27/2009  . DERMATITIS, ATOPIC 10/12/2007   . DIVERTICULOSIS, COLON 04/27/2009  . ECCHYMOSES, SPONTANEOUS 06/27/2008  . Elevated sedimentation rate 05/02/2009  . ESOPHAGEAL STRICTURE 04/27/2009  . GASTRITIS, CHRONIC 04/27/2009  . HYPERLIPIDEMIA 03/13/2008  . HYPERTENSION 04/09/2007  . Irritable bowel syndrome 08/11/2007  . KIDNEY DISEASE 04/09/2007  . NECK PAIN 09/14/2008  . NEUROPATHY, IDIOPATHIC PERIPHERAL NEC 08/11/2007  . OSTEOARTHRITIS, WRIST, RIGHT 08/05/2010   Past Surgical History  Procedure Date  . Hernia repair   . Lamenectomy   . Lumbar fusion   . Tonsillectomy     reports that he quit smoking about 9 years ago. He does not have any smokeless tobacco history on file. He reports that he does not drink alcohol or use illicit drugs. family history includes COPD in his father; Heart disease in his father; and Hernia in his mother. Allergies  Allergen Reactions  . Amoxicillin     REACTION: unspecified  . Atorvastatin     REACTION: nausea and blurred vision  . Ciprofloxacin     REACTION: unspecified  . Mycophenolate Mofetil     REACTION: unspecified  . Prednisone     REACTION: unspecified  . Rosuvastatin        Objective:   Physical Exam  Constitutional: He appears well-developed and well-nourished.  HENT:  Head: Normocephalic and atraumatic.  Eyes: Conjunctivae are normal. Pupils are equal, round, and reactive to light.  Neck: Normal range of motion. Neck supple.  Cardiovascular: Normal rate and regular rhythm.   Pulmonary/Chest: Effort normal and breath sounds normal.  Abdominal: Soft. Bowel sounds are normal. He exhibits distension. There is tenderness. There is rebound.  Assessment & Plan:   the patient has abdominal wall pain probably from a muscle tear we gave him a point injection at the site of the pain with 20 of Depo-Medrol and 1 cc of lidocaine at the site was prepped in a sterile manner using a 25-gauge needle the injection was placed at the point of greatest tenderness Band-Aid was placed  for hemostasis the patient tolerated the procedure well.   The patient's other problem are stable right now his height his hypertension is excellently controlled with her medications and is compliant and tolerating them well his cholesterol is controlled and his colitis appears to be in remission at this point he represented 3 months at which time we should order a lipid profile a CBC differential and a liver profile the patient was instructed to call his back should his abdominal pain persists the next step would be to evaluate this as a ventral hernia with a noncontrast CT of the abdominal wall

## 2011-02-11 ENCOUNTER — Ambulatory Visit: Payer: Medicare Other | Admitting: Internal Medicine

## 2011-02-14 ENCOUNTER — Telehealth: Payer: Self-pay | Admitting: *Deleted

## 2011-02-14 NOTE — Telephone Encounter (Signed)
Pt wants Dr. [name] know his abdomen is hurting just as bad, or worse.  No other N, V, or fever.

## 2011-02-14 NOTE — Telephone Encounter (Signed)
If he is having normal bm;s give it more time  And no strenuous acitivity such as mowing over the weekend

## 2011-02-25 ENCOUNTER — Ambulatory Visit (INDEPENDENT_AMBULATORY_CARE_PROVIDER_SITE_OTHER): Payer: Medicare Other | Admitting: Internal Medicine

## 2011-02-25 DIAGNOSIS — E538 Deficiency of other specified B group vitamins: Secondary | ICD-10-CM

## 2011-02-26 MED ORDER — CYANOCOBALAMIN 1000 MCG/ML IJ SOLN
1000.0000 ug | Freq: Once | INTRAMUSCULAR | Status: AC
  Administered 2011-02-25: 1000 ug via INTRAMUSCULAR

## 2011-03-05 ENCOUNTER — Ambulatory Visit (INDEPENDENT_AMBULATORY_CARE_PROVIDER_SITE_OTHER): Payer: Medicare Other | Admitting: Family Medicine

## 2011-03-05 ENCOUNTER — Encounter: Payer: Self-pay | Admitting: Family Medicine

## 2011-03-05 DIAGNOSIS — R109 Unspecified abdominal pain: Secondary | ICD-10-CM

## 2011-03-05 LAB — POCT URINALYSIS DIPSTICK
Bilirubin, UA: NEGATIVE
Blood, UA: NEGATIVE
Glucose, UA: NEGATIVE
Ketones, UA: NEGATIVE
Leukocytes, UA: NEGATIVE
Nitrite, UA: NEGATIVE
Spec Grav, UA: 1.025
Urobilinogen, UA: 0.2
pH, UA: 5

## 2011-03-05 LAB — CBC WITH DIFFERENTIAL/PLATELET
Basophils Absolute: 0 10*3/uL (ref 0.0–0.1)
Basophils Relative: 0.3 % (ref 0.0–3.0)
Eosinophils Absolute: 0.2 10*3/uL (ref 0.0–0.7)
Eosinophils Relative: 3.3 % (ref 0.0–5.0)
HCT: 39.6 % (ref 39.0–52.0)
Hemoglobin: 13.5 g/dL (ref 13.0–17.0)
Lymphocytes Relative: 38.2 % (ref 12.0–46.0)
Lymphs Abs: 2 10*3/uL (ref 0.7–4.0)
MCHC: 34.1 g/dL (ref 30.0–36.0)
MCV: 87.6 fl (ref 78.0–100.0)
Monocytes Absolute: 0.5 10*3/uL (ref 0.1–1.0)
Monocytes Relative: 9.8 % (ref 3.0–12.0)
Neutro Abs: 2.6 10*3/uL (ref 1.4–7.7)
Neutrophils Relative %: 48.4 % (ref 43.0–77.0)
Platelets: 160 10*3/uL (ref 150.0–400.0)
RBC: 4.52 Mil/uL (ref 4.22–5.81)
RDW: 14.3 % (ref 11.5–14.6)
WBC: 5.3 10*3/uL (ref 4.5–10.5)

## 2011-03-05 NOTE — Progress Notes (Signed)
  Subjective:    Patient ID: Timothy Gallagher, male    DOB: Apr 15, 1936, 75 y.o.   MRN: [id number]  HPI Here to recheck right sided abdominal pain that has persisted for over 4 weeks now. It started a few days after working in his yard, and he thought this was a muscle strain at first. However it has persisted and in fact the pain is worse than ever. It is a sharp pain that is worse when he bends his trunk or tries to sit up from lying down. No rash has been seen. No back pain. No change in urinations or BMs. No fever or nausea.    Review of Systems  Constitutional: Negative.   Respiratory: Negative.   Cardiovascular: Negative.   Gastrointestinal: Positive for abdominal pain. Negative for nausea, vomiting, diarrhea, constipation, blood in stool, abdominal distention, anal bleeding and rectal pain.  Genitourinary: Negative.        Objective:   Physical Exam  Constitutional: He appears well-developed and well-nourished.  Abdominal: Soft. Bowel sounds are normal. He exhibits no distension and no mass. There is no rebound and no guarding.       Diffuse tenderness in the right flank, the RUQ, and the RLQ.   Skin: Skin is warm and dry. No rash noted.          Assessment & Plan:  I do not think this is muscular at this point, but it is unclear what the etiology may be.

## 2011-03-06 LAB — HEPATIC FUNCTION PANEL
ALT: 13 U/L (ref 0–53)
AST: 21 U/L (ref 0–37)
Albumin: 3.4 g/dL — ABNORMAL LOW (ref 3.5–5.2)
Alkaline Phosphatase: 61 U/L (ref 39–117)
Bilirubin, Direct: 0.1 mg/dL (ref 0.0–0.3)
Total Bilirubin: 0.4 mg/dL (ref 0.3–1.2)
Total Protein: 6.7 g/dL (ref 6.0–8.3)

## 2011-03-06 LAB — BASIC METABOLIC PANEL
BUN: 26 mg/dL — ABNORMAL HIGH (ref 6–23)
CO2: 29 mEq/L (ref 19–32)
Calcium: 9.3 mg/dL (ref 8.4–10.5)
Chloride: 107 mEq/L (ref 96–112)
Creatinine, Ser: 1 mg/dL (ref 0.4–1.5)
GFR: 77.92 mL/min (ref >60.00)
Glucose, Bld: 129 mg/dL — ABNORMAL HIGH (ref 70–99)
Potassium: 5.2 mEq/L — ABNORMAL HIGH (ref 3.5–5.1)
Sodium: 144 mEq/L (ref 135–145)

## 2011-03-07 ENCOUNTER — Ambulatory Visit (INDEPENDENT_AMBULATORY_CARE_PROVIDER_SITE_OTHER)
Admission: RE | Admit: 2011-03-07 | Discharge: 2011-03-07 | Disposition: A | Discharge status: Home / Self Care | Payer: Medicare Other | Source: Ambulatory Visit | Attending: Family Medicine | Admitting: Family Medicine

## 2011-03-07 ENCOUNTER — Telehealth: Payer: Self-pay | Admitting: *Deleted

## 2011-03-07 DIAGNOSIS — R109 Unspecified abdominal pain: Secondary | ICD-10-CM

## 2011-03-07 MED ORDER — IOHEXOL 300 MG/ML  SOLN
100.0000 mL | Freq: Once | INTRAMUSCULAR | Status: AC | PRN
  Administered 2011-03-07: 100 mL via INTRAVENOUS

## 2011-03-07 NOTE — Telephone Encounter (Signed)
It was normal (see report)

## 2011-03-07 NOTE — Telephone Encounter (Signed)
Pt would like his CT scan results.

## 2011-03-07 NOTE — Telephone Encounter (Signed)
Pt aware of results 

## 2011-03-10 ENCOUNTER — Other Ambulatory Visit: Payer: Self-pay | Admitting: *Deleted

## 2011-03-10 ENCOUNTER — Telehealth: Payer: Self-pay | Admitting: *Deleted

## 2011-03-10 DIAGNOSIS — R52 Pain, unspecified: Secondary | ICD-10-CM

## 2011-03-10 MED ORDER — TAPENTADOL HCL 50 MG PO TABS: 50.0000 mg | ORAL_TABLET | Freq: Every day | ORAL | Status: DC

## 2011-03-10 NOTE — Telephone Encounter (Signed)
ORDER NUCYNTA FOR KIDNEY STONE PAIN=- PT TO PICK  UP

## 2011-03-10 NOTE — Telephone Encounter (Signed)
Pt is asking for CT results, and want Dr. [name] know he is still having severe pain all weekend.

## 2011-03-10 NOTE — Telephone Encounter (Signed)
Pt is calling back again to see what Dr. [name] wants him to do about his pain and results of tests.

## 2011-03-18 ENCOUNTER — Telehealth: Payer: Self-pay | Admitting: Internal Medicine

## 2011-03-18 ENCOUNTER — Ambulatory Visit (INDEPENDENT_AMBULATORY_CARE_PROVIDER_SITE_OTHER): Payer: Medicare Other | Admitting: Internal Medicine

## 2011-03-18 DIAGNOSIS — R52 Pain, unspecified: Secondary | ICD-10-CM

## 2011-03-18 DIAGNOSIS — E538 Deficiency of other specified B group vitamins: Secondary | ICD-10-CM

## 2011-03-18 MED ORDER — TAPENTADOL HCL 50 MG PO TABS: 50.0000 mg | ORAL_TABLET | Freq: Every day | ORAL | Status: DC

## 2011-03-18 MED ORDER — CYANOCOBALAMIN 1000 MCG/ML IJ SOLN
1500.0000 ug | Freq: Once | INTRAMUSCULAR | Status: AC
  Administered 2011-03-18: 1500 ug via INTRAMUSCULAR

## 2011-03-18 NOTE — Telephone Encounter (Signed)
Refill Nucynta. Pt will be in today for a b12 shot.

## 2011-03-18 NOTE — Telephone Encounter (Signed)
Pt just pick up his rx for Nucynta 50mg  1qd. Pt stated that he cannot afford the tier 4 copay. Would like an alternative cheaper rx called in to [facility]--[phone].

## 2011-03-18 NOTE — Telephone Encounter (Signed)
Ready for pick up

## 2011-03-19 ENCOUNTER — Other Ambulatory Visit: Payer: Self-pay | Admitting: *Deleted

## 2011-03-19 MED ORDER — TRAMADOL HCL 50 MG PO TABS: 50.0000 mg | ORAL_TABLET | Freq: Two times a day (BID) | ORAL | Status: DC | PRN

## 2011-03-19 NOTE — Telephone Encounter (Signed)
Please advise 

## 2011-03-19 NOTE — Telephone Encounter (Signed)
Pt informed and med sent in 

## 2011-03-19 NOTE — Telephone Encounter (Signed)
Ultram 50 two po bid prn

## 2011-03-27 ENCOUNTER — Encounter: Payer: Self-pay | Admitting: Gastroenterology

## 2011-03-27 ENCOUNTER — Ambulatory Visit (INDEPENDENT_AMBULATORY_CARE_PROVIDER_SITE_OTHER): Payer: Medicare Other | Admitting: Gastroenterology

## 2011-03-27 ENCOUNTER — Other Ambulatory Visit (INDEPENDENT_AMBULATORY_CARE_PROVIDER_SITE_OTHER): Payer: Medicare Other

## 2011-03-27 DIAGNOSIS — Z79899 Other long term (current) drug therapy: Secondary | ICD-10-CM

## 2011-03-27 DIAGNOSIS — F40298 Other specified phobia: Secondary | ICD-10-CM

## 2011-03-27 DIAGNOSIS — R197 Diarrhea, unspecified: Secondary | ICD-10-CM

## 2011-03-27 DIAGNOSIS — R071 Chest pain on breathing: Secondary | ICD-10-CM

## 2011-03-27 DIAGNOSIS — M940 Chondrocostal junction syndrome [Tietze]: Secondary | ICD-10-CM

## 2011-03-27 DIAGNOSIS — R109 Unspecified abdominal pain: Secondary | ICD-10-CM

## 2011-03-27 DIAGNOSIS — R0789 Other chest pain: Secondary | ICD-10-CM

## 2011-03-27 DIAGNOSIS — K589 Irritable bowel syndrome without diarrhea: Secondary | ICD-10-CM

## 2011-03-27 LAB — IBC PANEL
Iron: 75 ug/dL (ref 42–165)
Saturation Ratios: 25.1 % (ref 20.0–50.0)
Transferrin: 213.5 mg/dL (ref 212.0–360.0)

## 2011-03-27 LAB — CBC WITH DIFFERENTIAL/PLATELET
Basophils Absolute: 0 10*3/uL (ref 0.0–0.1)
Basophils Relative: 0.3 % (ref 0.0–3.0)
Eosinophils Absolute: 0.2 10*3/uL (ref 0.0–0.7)
Eosinophils Relative: 3.4 % (ref 0.0–5.0)
HCT: 39.3 % (ref 39.0–52.0)
Hemoglobin: 13.5 g/dL (ref 13.0–17.0)
Lymphocytes Relative: 35.8 % (ref 12.0–46.0)
Lymphs Abs: 1.7 10*3/uL (ref 0.7–4.0)
MCHC: 34.4 g/dL (ref 30.0–36.0)
MCV: 87.2 fl (ref 78.0–100.0)
Monocytes Absolute: 0.5 10*3/uL (ref 0.1–1.0)
Monocytes Relative: 10.5 % (ref 3.0–12.0)
Neutro Abs: 2.4 10*3/uL (ref 1.4–7.7)
Neutrophils Relative %: 50 % (ref 43.0–77.0)
Platelets: 159 10*3/uL (ref 150.0–400.0)
RBC: 4.51 Mil/uL (ref 4.22–5.81)
RDW: 13.9 % (ref 11.5–14.6)
WBC: 4.7 10*3/uL (ref 4.5–10.5)

## 2011-03-27 LAB — SEDIMENTATION RATE: Sed Rate: 38 mm/hr — ABNORMAL HIGH (ref 0–22)

## 2011-03-27 LAB — VITAMIN B12: Vitamin B-12: 657 pg/mL (ref 211–911)

## 2011-03-27 LAB — FERRITIN: Ferritin: 57.1 ng/mL (ref 22.0–322.0)

## 2011-03-27 LAB — FOLATE: Folate: >24.8 ng/mL (ref >5.9)

## 2011-03-27 MED ORDER — CELECOXIB 200 MG PO CAPS: 200.0000 mg | ORAL_CAPSULE | Freq: Every day | ORAL | Status: DC

## 2011-03-27 NOTE — Patient Instructions (Signed)
Celebrex has been sent to your pharmacy please take one a day as needed. Please go to the basement today for your labs.

## 2011-03-27 NOTE — Progress Notes (Signed)
This is a 75 year old Caucasian male who suffers from cancerophobia and a variety of anxiety-related syndromes. I worked him up numerous times in the past for nonspecific diarrhea which has been felt secondary to irritable bowel syndrome. He now has right-sided abdominal pain which is clearly musculoskeletal in nature. Repeat CT scan of the abdomen and pelvis was performed on 03/07/2011 showed small bilateral renal and hepatic cyst, small calculus in the lower left renal area, degenerative arthritis of the hips, and sigmoid diverticulosis. Multiple labs are been unremarkable. Colonoscopy was performed in January 2011 and was unremarkable. He is almost 2 medications listed and reviewed his chart. His chronic diarrhea is managed by when necessary Lomotil use.  Current Medications, Allergies, Past Medical History, Past Surgical History, Family History and Social History were reviewed in [employer] record.  Pertinent Review of Systems Negative   H and P :: Awake alert no acute distress appearing his stated age. Chest is clear there are no cardiac murmurs gallops or rubs. Physical ExamHis abdominal exam is entirely benign. He has marked tenderness over his cartilaginous lower rib area on the right side of the possible  Floating rib. He is markedly tender to touch. Extremities are unremarkable mental status is clear. Rectal exam is deferred.   A and P: Costochondritis with associated chronic cancerophobia and associated phobias. I say no need for further GI evaluation at this time. Placed him on Celebrex 200 mg a day as tolerated. He already has all when necessary tramadol. I've urged him to continue his other medications per primary care.  Please copy her primary care physician, referring physician, and pertinent subspecialists.

## 2011-03-31 ENCOUNTER — Ambulatory Visit (INDEPENDENT_AMBULATORY_CARE_PROVIDER_SITE_OTHER): Payer: Medicare Other | Admitting: Internal Medicine

## 2011-03-31 ENCOUNTER — Encounter: Payer: Self-pay | Admitting: Internal Medicine

## 2011-03-31 DIAGNOSIS — K589 Irritable bowel syndrome without diarrhea: Secondary | ICD-10-CM

## 2011-03-31 DIAGNOSIS — R110 Nausea: Secondary | ICD-10-CM

## 2011-03-31 DIAGNOSIS — R109 Unspecified abdominal pain: Secondary | ICD-10-CM

## 2011-03-31 DIAGNOSIS — J309 Allergic rhinitis, unspecified: Secondary | ICD-10-CM

## 2011-03-31 MED ORDER — PROMETHAZINE HCL 25 MG PO TABS: 25.0000 mg | ORAL_TABLET | Freq: Four times a day (QID) | ORAL | Status: AC | PRN

## 2011-03-31 NOTE — Progress Notes (Signed)
  Subjective:    Patient ID: Timothy Gallagher, male    DOB: January 06, 1936, 75 y.o.   MRN: [id number]  HPI 75 year old patient who presents with a two-day history of nausea dizziness and some mild diaphoresis. Symptoms began yesterday and today seem somewhat improved. He has recently been placed on Celebrex due to costochondral chest wall pain. He does have a history of IBS as well as allergic rhinitis. He does describe an increase in rhinorrhea and postnasal drip. Denies any fever vomiting or change in his bowel habits;  he has had a recent GI evaluation as well as CT abdominal scan.    Review of Systems  Constitutional: Negative for fever, chills, appetite change and fatigue.  HENT: Negative for hearing loss, ear pain, congestion, sore throat, trouble swallowing, neck stiffness, dental problem, voice change and tinnitus.   Eyes: Negative for pain, discharge and visual disturbance.  Respiratory: Negative for cough, chest tightness, wheezing and stridor.   Cardiovascular: Negative for chest pain, palpitations and leg swelling.  Gastrointestinal: Positive for nausea. Negative for vomiting, abdominal pain, diarrhea, constipation, blood in stool and abdominal distention.  Genitourinary: Negative for urgency, hematuria, flank pain, discharge, difficulty urinating and genital sores.  Musculoskeletal: Negative for myalgias, back pain, joint swelling, arthralgias and gait problem.  Skin: Negative for rash.  Neurological: Negative for dizziness, syncope, speech difficulty, weakness, numbness and headaches.  Hematological: Negative for adenopathy. Does not bruise/bleed easily.  Psychiatric/Behavioral: Negative for behavioral problems and dysphoric mood. The patient is not nervous/anxious.        Objective:   Physical Exam  Constitutional: He is oriented to person, place, and time. He appears well-developed.       Repeat blood pressure 140/80  HENT:  Head: Normocephalic.  Right Ear: External ear normal.   Left Ear: External ear normal.  Eyes: Conjunctivae and EOM are normal.  Neck: Normal range of motion.  Cardiovascular: Normal rate and normal heart sounds.   Pulmonary/Chest: Breath sounds normal.  Abdominal: Soft. Bowel sounds are normal. He exhibits no distension. There is no tenderness. There is no rebound.       The right bony costal margin more prominent on the right  Musculoskeletal: Normal range of motion. He exhibits no edema and no tenderness.  Neurological: He is alert and oriented to person, place, and time.  Psychiatric: He has a normal mood and affect. His behavior is normal.          Assessment & Plan:   Nausea. Will treat symptomatically and clinically observed. Will also place on Allegra for one week. Nausea may be secondary to increased rhinorrhea. He does seem improved today. He does have a history of IBS Allergic rhinitis Hypertension.

## 2011-03-31 NOTE — Patient Instructions (Signed)
Discontinue Celebrex  Phenergan as directed Allegra as directed  Call or return to clinic prn if these symptoms worsen or fail to improve as anticipated.

## 2011-04-01 NOTE — Assessment & Plan Note (Signed)
[facility]                         GASTROENTEROLOGY OFFICE NOTE   NAME:Timothy Gallagher, Timothy Gallagher                      MRN:          [id number]  DATE:10/26/2007                            DOB:          02/09/1936    Oluwatobi continues to complain of intermittent diarrhea but really denies  abdominal pain or other gastrointestinal symptoms.  He takes Lomotil 1-2  a day, which seems to control his loose stools.  He specifically denies  melena or hematochezia.  He is on Hyzaar 50/12.5 mg a day and Lipitor 10  mg a day for Dr. [name].  He has been evaluated by Dr. Lowell Guitar for  glomerular nephritis, but apparently his kidney function has been  stable, and he has never needed dialysis.   I have seen Elbie for many years because of his diarrhea, which has been  felt to be secondary to irritable bowel syndrome with previous negative  colonoscopy exams and multiple stool exams.  His last colon exam was in  November, 2004, however.  He does have known diverticulosis.   Mccabe denies fever, chills, or systemic complaints, but he has lost 10  pounds of weight since he was last seen.  In the past, I have treated  him empirically with probiotic therapy without symptomatic improvement.  He is on B12 replacement therapy per Dr. [name].   PHYSICAL EXAMINATION:  His weight today is 190 pounds, and blood  pressure is 117/66 and pulse was 72 and regular.  Sostenes appeared more sick than I have seen him on previous exams.  He  certainly had no icterus or stigmata of chronic liver disease.  CHEST:  Generally clear.  He appeared to be in a regular rhythm without murmurs, rubs or gallops.  ABDOMEN:  No distention, organomegaly, masses, or tenderness.  Bowel  sounds were normal.  Inspection of his rectum was unremarkable.  Rectal exam showed soft  stool present, which was +1 guaiac positive.   ASSESSMENT:  There has always been a question to whether or not Mr.  Lowell Guitar has underlying  inflammatory bowel disease, and I am somewhat  concerned about his continued diarrhea and guaiac positive stool and  certainly thinks he needs a follow-up colonoscopy exam.  I will also ask  Dr. Lowell Guitar and Dr. [name] to send [place] any pertinent laboratory data  concerning Shone and his management.   RECOMMENDATIONS:  1. Outpatient colonoscopy exam at his convenience.  2. Continue above-mentioned medications.  3. Laboratory data and records from Drs. Lowell Guitar and [name] requested.    Vania Rea. Jarold Motto, MD, Caleen Essex, FAGA  Electronically Signed   DRP/MedQ  DD: 10/26/2007  DT: 10/26/2007  Job #: [id number]   cc:   [name], MD

## 2011-04-04 NOTE — Op Note (Signed)
NAME:  Timothy Gallagher, Timothy Gallagher               ACCOUNT NO.:  [id number]   MEDICAL RECORD NO.:  [id number]          PATIENT TYPE:  AMB   LOCATION:  [facility]                         FACILITY:  [facility]   PHYSICIAN:  Ronald L. Earlene Plater, M.D.  DATE OF BIRTH:  09/12/1936   DATE OF PROCEDURE:  08/28/2006  DATE OF DISCHARGE:  08/28/2006                                 OPERATIVE REPORT   DIAGNOSIS:  Left ureterolithiasis.   OPERATIVE PROCEDURE:  1. Cystourethroscopy.  2. Left retrograde ureteral pyelogram.  3. Left ureteroscopy with holmium laser lithotripsy.  4. Basket stone extraction.  5. Placement of left double-J stent.   SURGEON:  Gaynelle Arabian, MD   ANESTHESIA:  LMA.   ESTIMATED BLOOD LOSS:  Negligible.   TUBES:  26 cm 7-French contour double pigtail stent.   COMPLICATIONS:  None.   INDICATIONS FOR PROCEDURE:  Timothy Gallagher is a very nice 75 year old white  male, who had presented with a left upper ureterolithiasis.  He underwent  ESWL and had partial fragmentation but a large approximately 7 mm fragment  is locked in the lower ureter and has remained in that site for some time.  He has been having some left periodic flank pain, left groin pain, urgency,  frequency, and with continued localization of the stone and nonprogression  over 2-1/2 months, we have elected to proceed with ureteroscopic  manipulation.   PROCEDURE IN DETAIL:  The patient was placed in supine position.  After  proper LMA anesthesia, was placed in the dorsal lithotomy position and  prepped and draped with Betadine in sterile fashion.  Cystourethroscopy  performed with a 22.5 French Olympus panendoscope utilizing the 12 and 70  degree lenses.  The bladder was carefully inspected.  There was moderate  trilobar hypertrophy; grade 1 trabeculation was noted to the bladder.  Efflux of clear was noted from the right ureteral orifice.  Left ureteral  orifice was in normal location.  A 0.038 French sensor wire was placed in  the left  renal pelvis past the stone and utilizing the dilating element of  the ureteral access catheter, the ureter was dilated to 12-French.  Ureteroscopy was then performed with a short thin 70 flexible ureteroscope.  The stone was visualized.  It was noted to be quite large and very hard and  utilizing the 365 micron laser fiber and the holmium laser as is documented  in the record, the stone was fragmented into multiple small fragments.  These were grasped with a nitinol basket and extracted and will be sent for  stone analysis.  Reinspection of the lower two-thirds ureter revealed there  were no perforations; no significant bleeding was noted.  A 6-French open-  ended catheter was placed over the wire into the left renal pelvis, and a  retrograde ureteral pyelogram was performed, and there were no filling  defects, no extravasation noted throughout the collecting system.  The wire  was replaced and under fluoroscopic guidance, a 26-cm 7-French contour  double pigtail stent was placed and noted to be in good position within the  left renal pelvis within the bladder.  No string was attached.  The bladder  was drained.  The panendoscope was removed, and the patient was taken to the  recovery room stable.   ADDENDUM DICTATION:  Left retrograde ureteral pyelogram was performed with a  6-French open-ended catheter, the collecting system noted to be without  filling defects, and there were no perforations or extravasation.      Ronald L. Earlene Plater, M.D.  Electronically Signed     RLD/MEDQ  D:  08/28/2006  T:  08/30/2006  Job:  [id number]

## 2011-04-04 NOTE — Assessment & Plan Note (Signed)
[facility]                                   ON-CALL NOTE   VIAN, FLUEGEL                        MRN:          [id number]  DATE:08/10/2006                            DOB:          [birth year]    PRIMARY CARE PHYSICIAN:  Dr. [name].   [name] is a patient of Dr. [name] who reports that he is on Ultram ER  once a day for kidney stones.  He reports that last week he had lithotripsy  by the urologist.  He was wondering if he can take a Bayer aspirin this  evening since the Ultram is a once a day drug.   PLAN:  I advised patient that he can go ahead and take one Bayer aspirin 325  mg as long as he has no contraindication including allergies or previous  history of bleeding.  The patient also advised that if his symptoms worsen,  he is to follow up with his primary care doctor or urologist.  Of note, the  patient does have a scheduled appointment to see his urologist in 2 weeks  for reassessment.                                   Leanne Chang, M.D.   LA/MedQ  DD:  08/10/2006  DT:  08/12/2006  Job #:  [id number]   cc:   [name], MD

## 2011-04-04 NOTE — Assessment & Plan Note (Signed)
[facility]                           GASTROENTEROLOGY OFFICE NOTE   NAME:CROWDERJewel, Venditto                      MRN:          [id number]  DATE:09/22/2006                            DOB:          1936-09-01    Tadhg apparently has progressive _glomerulonephritis with what sounds like  possible nephrotic syndrome, and has been referred by Dr. Lowell Guitar to [facility].  He complains today of his usual complaints of considerable irritable  syndrome with alternating diarrhea and constipation.  It is of note that the  patient now admits to taking 3 to 6 BC powders a day, which may have  something to do with his renal failure.  He apparently has been on CellCept,  but could not tolerate this medication.   He recently had lithotripsy and also what sounds like cystoscopy with  removal of the ureteral stone by Dr. Darvin Neighbours.  He is concerned that all  of this mess has messed up his bowel function, which has been irregular for  multiple years.  The patient has refused colonoscopy exam last done 3 years  ago.  He does have rather severe diverticulosis of the colon.  He has known  pernicious anemia and achlorhydria.  He is on B-12 replacement therapy.   Patient has certainly had no weight loss, and his weight today was 198  pounds.  Blood pressure 122/84, and pulse was 60 and regular.  General  physical exam was not done.   ASSESSMENT:  Tavaughn has chronic irritable bowel syndrome.  He may have some  alteration in his gut flora on frequent antibiotic use, which he has been  taking because of sinus infections and kidney dysfunction.   RECOMMENDATIONS:  I will give him a trial of Align probiotic therapy for the  next month.  I have urged him to keep his appointment at [facility] for  further nephrology followup.  I have suggested to him that it is time for  him to have followup colonoscopy exam, but he is not interested in any  further testing at this time.  He is  to continue his other medications per  Dr. [name].     Vania Rea. Jarold Motto, MD, Caleen Essex, FAGA  Electronically Signed    DRP/MedQ  DD: 09/22/2006  DT: 09/22/2006  Job #: [id number]   cc:   Mindi Slicker. Lowell Guitar, M.D.  [name], MD  Lucrezia Starch. Earlene Plater, M.D.

## 2011-04-08 ENCOUNTER — Ambulatory Visit (INDEPENDENT_AMBULATORY_CARE_PROVIDER_SITE_OTHER): Payer: Medicare Other | Admitting: Internal Medicine

## 2011-04-08 DIAGNOSIS — D518 Other vitamin B12 deficiency anemias: Secondary | ICD-10-CM

## 2011-04-08 DIAGNOSIS — D519 Vitamin B12 deficiency anemia, unspecified: Secondary | ICD-10-CM

## 2011-04-08 MED ORDER — CYANOCOBALAMIN 1000 MCG/ML IJ SOLN
1000.0000 ug | Freq: Once | INTRAMUSCULAR | Status: AC
  Administered 2011-04-08: 1000 ug via INTRAMUSCULAR

## 2011-04-16 ENCOUNTER — Ambulatory Visit (INDEPENDENT_AMBULATORY_CARE_PROVIDER_SITE_OTHER): Payer: Medicare Other | Admitting: Internal Medicine

## 2011-04-16 ENCOUNTER — Other Ambulatory Visit: Payer: Self-pay | Admitting: *Deleted

## 2011-04-16 ENCOUNTER — Encounter: Payer: Self-pay | Admitting: Internal Medicine

## 2011-04-16 VITALS — BP 144/80 | HR 76 | Temp 98.2°F | Resp 16 | Ht 74.0 in | Wt 202.0 lb

## 2011-04-16 DIAGNOSIS — J309 Allergic rhinitis, unspecified: Secondary | ICD-10-CM

## 2011-04-16 DIAGNOSIS — Z23 Encounter for immunization: Secondary | ICD-10-CM

## 2011-04-16 DIAGNOSIS — J320 Chronic maxillary sinusitis: Secondary | ICD-10-CM

## 2011-04-16 DIAGNOSIS — R109 Unspecified abdominal pain: Secondary | ICD-10-CM

## 2011-04-16 DIAGNOSIS — Z0000 Encounter for general adult medical examination without abnormal findings: Secondary | ICD-10-CM

## 2011-04-16 DIAGNOSIS — E538 Deficiency of other specified B group vitamins: Secondary | ICD-10-CM

## 2011-04-16 MED ORDER — ZOSTER VACCINE LIVE 19400 UNT/0.65ML ~~LOC~~ SOLR
0.6500 mL | Freq: Once | SUBCUTANEOUS | Status: AC
  Administered 2011-04-16: 19400 [IU] via SUBCUTANEOUS

## 2011-04-16 MED ORDER — ZOSTER VACCINE LIVE 19400 UNT/0.65ML ~~LOC~~ SOLR: 0.6500 mL | Freq: Once | SUBCUTANEOUS | Status: DC

## 2011-04-16 NOTE — Progress Notes (Signed)
Addended by: Willy Eddy on: 04/16/2011 05:29 PM   Modules accepted: Orders

## 2011-04-16 NOTE — Progress Notes (Signed)
Subjective:    Patient ID: Timothy Gallagher, male    DOB: 1935/12/23, 75 y.o.   MRN: [id number]  HPI age and is followed for multiple medical problems including allergic rhinitis gastroesophageal reflux B12 deficiency and anemia.  He is also followed for hypertension.  We reviewed his blood work including a normal CBC a B12 level in the 600s normal folic acid.  He has trouble with sleep and has tried multiple different sleeping medications in the past.    Review of Systems  Constitutional: Negative for fever and fatigue.  HENT: Negative for hearing loss, congestion, neck pain and postnasal drip.   Eyes: Negative for discharge, redness and visual disturbance.  Respiratory: Negative for cough, shortness of breath and wheezing.   Cardiovascular: Negative for leg swelling.  Gastrointestinal: Negative for abdominal pain, constipation and abdominal distention.  Genitourinary: Negative for urgency and frequency.  Musculoskeletal: Negative for joint swelling and arthralgias.  Skin: Negative for color change and rash.  Neurological: Negative for weakness and light-headedness.  Hematological: Negative for adenopathy.  Psychiatric/Behavioral: Negative for behavioral problems.   Past Medical History  Diagnosis Date  . ACNE ROSACEA 06/27/2009  . ACTINIC KERATOSIS, HEAD 04/18/2009  . Acute maxillary sinusitis 05/14/2010  . ALLERGIC RHINITIS 04/09/2007  . B12 DEFICIENCY 06/07/2007  . BACK PAIN WITH RADICULOPATHY 04/24/2008  . CHEST WALL PAIN, ACUTE 06/15/2009  . Chronic maxillary sinusitis 05/29/2008  . COLITIS 04/27/2009  . COLONIC POLYPS, HX OF 04/27/2009    tubular adenomas  . DERMATITIS, ATOPIC 10/12/2007  . DIVERTICULOSIS, COLON 04/27/2009  . ECCHYMOSES, SPONTANEOUS 06/27/2008  . Elevated sedimentation rate 05/02/2009  . ESOPHAGEAL STRICTURE 04/27/2009  . GASTRITIS, CHRONIC 04/27/2009  . HYPERLIPIDEMIA 03/13/2008  . HYPERTENSION 04/09/2007  . Irritable bowel syndrome 08/11/2007  . KIDNEY DISEASE  04/09/2007  . NECK PAIN 09/14/2008  . NEUROPATHY, IDIOPATHIC PERIPHERAL NEC 08/11/2007  . OSTEOARTHRITIS, WRIST, RIGHT 08/05/2010   Past Surgical History  Procedure Date  . Hernia repair   . Lamenectomy   . Lumbar fusion   . Tonsillectomy     reports that he quit smoking about 9 years ago. He has never used smokeless tobacco. He reports that he does not drink alcohol or use illicit drugs. family history includes COPD in his father; Heart disease in his father; and Hernia in his mother.  There is no history of Colon cancer. Allergies  Allergen Reactions  . Amoxicillin     REACTION: unspecified  . Atorvastatin     REACTION: nausea and blurred vision  . Ciprofloxacin     REACTION: unspecified  . Mycophenolate Mofetil     REACTION: unspecified  . Prednisone     REACTION: unspecified  . Rosuvastatin         Objective:   Physical Exam  Constitutional: He appears well-developed and well-nourished.  HENT:  Head: Normocephalic and atraumatic.  Eyes: Conjunctivae are normal. Pupils are equal, round, and reactive to light.  Neck: Normal range of motion. Neck supple.  Cardiovascular: Normal rate and regular rhythm.   Pulmonary/Chest: Effort normal and breath sounds normal.  Abdominal: Soft. Bowel sounds are normal.          Assessment & Plan:  The patient's abdominal pain has improved with treatment of what is felt to be a cracked floating rib on his right flank.  Blood pressure stable he had good report from his nephrologist that his renal function appears to be stable (nephrotic) and his allergies are best treated with the use of Astelin and  samples will be given

## 2011-04-16 NOTE — Progress Notes (Signed)
Addended by: Willy Eddy on: 04/16/2011 04:32 PM   Modules accepted: Orders

## 2011-04-18 ENCOUNTER — Telehealth: Payer: Self-pay | Admitting: *Deleted

## 2011-04-18 NOTE — Telephone Encounter (Signed)
Pt is complaining of nausea and weakness with nasal congestion.  Asking if it is coming from the shingles injection or if there is a RX he can take for this.

## 2011-04-18 NOTE — Telephone Encounter (Signed)
Advised pt OTC Claritin per Dr. [name]

## 2011-04-18 NOTE — Telephone Encounter (Signed)
Per dr [name]- take some claritin otc

## 2011-04-21 ENCOUNTER — Ambulatory Visit (INDEPENDENT_AMBULATORY_CARE_PROVIDER_SITE_OTHER): Payer: Medicare Other | Admitting: Family Medicine

## 2011-04-21 ENCOUNTER — Telehealth: Payer: Self-pay | Admitting: *Deleted

## 2011-04-21 ENCOUNTER — Encounter: Payer: Self-pay | Admitting: Family Medicine

## 2011-04-21 VITALS — BP 142/82 | Temp 98.2°F

## 2011-04-21 DIAGNOSIS — B9789 Other viral agents as the cause of diseases classified elsewhere: Secondary | ICD-10-CM

## 2011-04-21 DIAGNOSIS — B349 Viral infection, unspecified: Secondary | ICD-10-CM

## 2011-04-21 NOTE — Progress Notes (Signed)
  Subjective:    Patient ID: Timothy Gallagher, male    DOB: [birth year], 75 y.o.   MRN: [id number]  HPI Patient seen with what he terms flulike symptoms. He had shingles vaccine last Wednesday has felt poor since then. No documented fever. Patient had some body aches and mild sore throat those symptoms are better. No cough. No significant nasal congestion. Denies any dysuria, abdominal pain, chills, fever, or any recent tick bite. Predominant symptom now some fatigue. No rash.   Review of Systems  Constitutional: Positive for fatigue. Negative for fever and chills.  HENT: Negative for sore throat, voice change and sinus pressure.   Respiratory: Negative for cough and shortness of breath.   Cardiovascular: Negative for chest pain, palpitations and leg swelling.  Gastrointestinal: Negative for abdominal pain.  Genitourinary: Negative for dysuria.  Hematological: Negative for adenopathy.       Objective:   Physical Exam  Constitutional: He appears well-developed and well-nourished. No distress.  HENT:  Right Ear: External ear normal.  Left Ear: External ear normal.  Mouth/Throat: Oropharynx is clear and moist.  Neck: Neck supple. No thyromegaly present.  Cardiovascular: Normal rate and regular rhythm.   Pulmonary/Chest: Effort normal and breath sounds normal. No respiratory distress. He has no wheezes. He has no rales.  Musculoskeletal: He exhibits no edema.  Lymphadenopathy:    He has no cervical adenopathy.  Skin: No rash noted.          Assessment & Plan:  Probable viral syndrome. Doubt correlation with shingles vaccine. Nonfocal exam. Reassurance given. Touch base in one week with primary if no better

## 2011-04-21 NOTE — Telephone Encounter (Signed)
Dr [name] was aware as he called on Friday- was told to take claritin

## 2011-04-21 NOTE — Patient Instructions (Signed)
Follow up promptly for any fever or worsening symptoms 

## 2011-04-21 NOTE — Telephone Encounter (Signed)
[facility] Triage Call Report Triage Record Num: [id number] Operator: Lesli Albee Patient Name: Timothy Gallagher Call Date & Time: 04/19/2011 12:54:36PM Patient Phone: [phone] PCP: Patient Gender: Male PCP Fax : Patient DOB: September 23, 1936 Practice Name: [facility] Reason for Call: Pt is calling to report a burning sensation on the inside of his lips with a sore throat. No fever. No cold sx. Pt had an immunization /Varicella vaccine on 5/30 and developed these sx the next day. Triage negative. Home care advised. pt will f/u at the office on Monday if no better. Protocol(s) Used: Sore Throat or Hoarseness Recommended Outcome per Protocol: Provide Home/Self Care Reason for Outcome: Sore throat AND no other symptoms Care Advice: ~ SYMPTOM / CONDITION MANAGEMENT Analgesic/Antipyretic Advice - Acetaminophen: Consider acetaminophen as directed on label or by pharmacist/provider for pain or fever PRECAUTIONS: - Use if there is no history of liver disease, alcoholism, or intake of three or more alcohol drinks per day - Only if approved by provider during pregnancy or when breastfeeding - During pregnancy, acetaminophen should not be taken more than 3 consecutive days without telling provider - Do not exceed recommended dose or frequency ~ Sore Throat Relief: - Use warm salt water gargles 3 to 4 times/day, as needed (1/2 tsp. salt in 8 oz. [.2 liters] water). - Suck on hard candy, nonprescription or herbal throat lozenges (sugar-free if diabetic) - Eat soothing, soft food/fluids (broths, soups, or honey and lemon juice in hot tea, Popsicles, frozen yogurt or sherbet, scrambled eggs, cooked cereals, Jell-O or puddings) whichever is most comforting. - Avoid eating salty, spicy or acidic foods. ~ To help relieve nasal congestion, use nonprescription saline nasal spray according to label instructions or as directed by a healthcare provider. ~ If pregnancy is known or suspected, get  advice from provider before using any nonprescription medication other than acetaminophen ~ 04/19/2011 1:04:01PM Page 1 of 1 [id number]

## 2011-04-25 ENCOUNTER — Telehealth: Payer: Self-pay | Admitting: *Deleted

## 2011-04-25 MED ORDER — METHYLPREDNISOLONE 4 MG PO KIT: PACK | ORAL | Status: AC

## 2011-04-25 NOTE — Telephone Encounter (Signed)
Notified pt. 

## 2011-04-25 NOTE — Telephone Encounter (Signed)
Per dr [name]- give 4 mg medrol dose pack

## 2011-04-25 NOTE — Telephone Encounter (Signed)
Pt is having a large amount of post nasal drainage that is making him nauseated.  Needs RX for this called to [facility].

## 2011-04-29 ENCOUNTER — Ambulatory Visit (INDEPENDENT_AMBULATORY_CARE_PROVIDER_SITE_OTHER): Payer: Medicare Other | Admitting: Internal Medicine

## 2011-04-29 DIAGNOSIS — E538 Deficiency of other specified B group vitamins: Secondary | ICD-10-CM

## 2011-04-29 MED ORDER — CYANOCOBALAMIN 1000 MCG/ML IJ SOLN
1000.0000 ug | Freq: Once | INTRAMUSCULAR | Status: AC
  Administered 2011-04-29: 1000 ug via INTRAMUSCULAR

## 2011-05-05 ENCOUNTER — Ambulatory Visit (INDEPENDENT_AMBULATORY_CARE_PROVIDER_SITE_OTHER): Payer: Medicare Other | Admitting: Internal Medicine

## 2011-05-05 ENCOUNTER — Encounter: Payer: Self-pay | Admitting: Internal Medicine

## 2011-05-05 VITALS — BP 118/68 | Temp 98.3°F | Wt 203.0 lb

## 2011-05-05 DIAGNOSIS — N289 Disorder of kidney and ureter, unspecified: Secondary | ICD-10-CM

## 2011-05-05 DIAGNOSIS — I10 Essential (primary) hypertension: Secondary | ICD-10-CM

## 2011-05-05 DIAGNOSIS — R350 Frequency of micturition: Secondary | ICD-10-CM

## 2011-05-05 DIAGNOSIS — R809 Proteinuria, unspecified: Secondary | ICD-10-CM | POA: Insufficient documentation

## 2011-05-05 LAB — POCT URINALYSIS DIPSTICK
Bilirubin, UA: NEGATIVE
Glucose, UA: NEGATIVE
Leukocytes, UA: NEGATIVE
Nitrite, UA: NEGATIVE
Protein, UA: 2000
Urobilinogen, UA: 0.2
pH, UA: 6

## 2011-05-05 LAB — HEPATIC FUNCTION PANEL
ALT: 15 U/L (ref 0–53)
AST: 17 U/L (ref 0–37)
Albumin: 3.3 g/dL — ABNORMAL LOW (ref 3.5–5.2)
Alkaline Phosphatase: 57 U/L (ref 39–117)
Bilirubin, Direct: 0.1 mg/dL (ref 0.0–0.3)
Total Bilirubin: 0.6 mg/dL (ref 0.3–1.2)
Total Protein: 6.6 g/dL (ref 6.0–8.3)

## 2011-05-05 LAB — BASIC METABOLIC PANEL
BUN: 21 mg/dL (ref 6–23)
CO2: 28 mEq/L (ref 19–32)
Calcium: 8.6 mg/dL (ref 8.4–10.5)
Chloride: 99 mEq/L (ref 96–112)
Creatinine, Ser: 0.8 mg/dL (ref 0.4–1.5)
GFR: 99.32 mL/min (ref >60.00)
Glucose, Bld: 91 mg/dL (ref 70–99)
Potassium: 4.6 mEq/L (ref 3.5–5.1)
Sodium: 140 mEq/L (ref 135–145)

## 2011-05-05 NOTE — Patient Instructions (Signed)
RAPAFLO ONE DAILY  Return office visit in 2 weeks  Avoid caffeinated products and decongestants

## 2011-05-05 NOTE — Progress Notes (Signed)
  Subjective:    Patient ID: Timothy Gallagher, male    DOB: Dec 24, 1935, 75 y.o.   MRN: [id number]  HPI  75 year-old patient who presents with a 3 day history of urinary frequency. He states this has been occurring even through the night on an hourly basis. No urgency or  dysuria. Denies any dramatic change in force of urinary stream. He does have a history of nephrolithiasis but no other kidney disease. A urinalysis today revealed proteinuria without hematuria or pyuria. He has had no fever or flank pain .  He has had some recent lab but this did not include renal function studies or electrolytes.  Review of Systems  Genitourinary: Positive for frequency and difficulty urinating. Negative for dysuria, urgency, hematuria, flank pain, discharge and penile pain.       Objective:   Physical Exam  Constitutional: He is oriented to person, place, and time. He appears well-developed.  HENT:  Head: Normocephalic.  Right Ear: External ear normal.  Left Ear: External ear normal.  Eyes: Conjunctivae and EOM are normal.  Neck: Normal range of motion.  Cardiovascular: Normal rate and normal heart sounds.   Pulmonary/Chest: Breath sounds normal.  Abdominal: Bowel sounds are normal.  Genitourinary:        Prostate +3 enlarged smooth and symmetrical  Musculoskeletal: Normal range of motion. He exhibits no edema and no tenderness.  Neurological: He is alert and oriented to person, place, and time.  Psychiatric: He has a normal mood and affect. His behavior is normal.          Assessment & Plan:  BPH with urinary frequency. Will place on the alpha blocker check electrolytes and renal function indices and asked to come back in 2 weeks for a followup urinalysis. If proteinuria persists will further investigate

## 2011-05-07 ENCOUNTER — Ambulatory Visit: Payer: Medicare Other | Admitting: Internal Medicine

## 2011-05-16 ENCOUNTER — Ambulatory Visit (INDEPENDENT_AMBULATORY_CARE_PROVIDER_SITE_OTHER): Payer: Medicare Other | Admitting: Internal Medicine

## 2011-05-16 ENCOUNTER — Encounter: Payer: Self-pay | Admitting: Internal Medicine

## 2011-05-16 VITALS — BP 136/80 | HR 72 | Temp 98.2°F | Resp 16 | Ht 74.0 in | Wt 198.0 lb

## 2011-05-16 DIAGNOSIS — N4281 Prostatodynia syndrome: Secondary | ICD-10-CM

## 2011-05-16 DIAGNOSIS — J069 Acute upper respiratory infection, unspecified: Secondary | ICD-10-CM

## 2011-05-16 DIAGNOSIS — E538 Deficiency of other specified B group vitamins: Secondary | ICD-10-CM

## 2011-05-16 MED ORDER — CYANOCOBALAMIN 1000 MCG/ML IJ SOLN
1000.0000 ug | Freq: Once | INTRAMUSCULAR | Status: AC
  Administered 2011-05-16: 1000 ug via INTRAMUSCULAR

## 2011-05-16 NOTE — Progress Notes (Signed)
Subjective:    Patient ID: Timothy Gallagher, male    DOB: [birth year], 75 y.o.   MRN: [id number]  HPI Patient saw my partner for prostatodynia.  He was treated with proper flow no antibiotics his symptoms have resolved he stopped a rapid flu and states that he has any painful urination.  He does have chronic sinusitis with a flare of based upon an upper respiratory tract infection that left him with mucousy discharge.  There is no color no fever no chills no signs of bacterial infection he does have significant seasonal allergies.  He has been using Mucinex with some success in alleviating his symptoms His blood pressure stable his current medications he received a B12 shot today for his peripheral neuropathy    Review of Systems  Constitutional: Negative for fever and fatigue.  HENT: Positive for congestion, rhinorrhea, postnasal drip and sinus pressure. Negative for hearing loss and neck pain.   Eyes: Negative for discharge, redness and visual disturbance.  Respiratory: Negative for cough, shortness of breath and wheezing.   Cardiovascular: Negative for leg swelling.  Gastrointestinal: Negative for abdominal pain, constipation and abdominal distention.  Genitourinary: Negative for urgency and frequency.  Musculoskeletal: Negative for joint swelling and arthralgias.  Skin: Negative for color change and rash.  Neurological: Negative for weakness and light-headedness.  Hematological: Negative for adenopathy.  Psychiatric/Behavioral: Negative for behavioral problems.   Past Medical History  Diagnosis Date  . ACNE ROSACEA 06/27/2009  . ACTINIC KERATOSIS, HEAD 04/18/2009  . Acute maxillary sinusitis 05/14/2010  . ALLERGIC RHINITIS 04/09/2007  . B12 DEFICIENCY 06/07/2007  . BACK PAIN WITH RADICULOPATHY 04/24/2008  . CHEST WALL PAIN, ACUTE 06/15/2009  . Chronic maxillary sinusitis 05/29/2008  . COLITIS 04/27/2009  . COLONIC POLYPS, HX OF 04/27/2009    tubular adenomas  . DERMATITIS, ATOPIC  10/12/2007  . DIVERTICULOSIS, COLON 04/27/2009  . ECCHYMOSES, SPONTANEOUS 06/27/2008  . Elevated sedimentation rate 05/02/2009  . ESOPHAGEAL STRICTURE 04/27/2009  . GASTRITIS, CHRONIC 04/27/2009  . HYPERLIPIDEMIA 03/13/2008  . HYPERTENSION 04/09/2007  . Irritable bowel syndrome 08/11/2007  . KIDNEY DISEASE 04/09/2007  . NECK PAIN 09/14/2008  . NEUROPATHY, IDIOPATHIC PERIPHERAL NEC 08/11/2007  . OSTEOARTHRITIS, WRIST, RIGHT 08/05/2010   Past Surgical History  Procedure Date  . Hernia repair   . Lamenectomy   . Lumbar fusion   . Tonsillectomy     reports that he quit smoking about 9 years ago. He has never used smokeless tobacco. He reports that he does not drink alcohol or use illicit drugs. family history includes COPD in his father; Heart disease in his father; and Hernia in his mother.  There is no history of Colon cancer. Allergies  Allergen Reactions  . Amoxicillin     REACTION: unspecified  . Atorvastatin     REACTION: nausea and blurred vision  . Ciprofloxacin     REACTION: unspecified  . Mycophenolate Mofetil     REACTION: unspecified  . Prednisone     REACTION: unspecified  . Rosuvastatin        Objective:   Physical Exam  Constitutional: He appears well-developed and well-nourished.  HENT:  Head: Normocephalic and atraumatic.       Turbinates inflamed and swollen  Eyes: Conjunctivae are normal. Pupils are equal, round, and reactive to light.  Neck: Normal range of motion. Neck supple.  Cardiovascular: Normal rate and regular rhythm.   Pulmonary/Chest: Effort normal and breath sounds normal.  Abdominal: Soft. Bowel sounds are normal.  Assessment & Plan:  Monitor off the rapaflow for prostatodynia if his symptoms return considering chronic prostate medication  For his allergies we'll begin Astelin nasal spray one spray in each nostril twice a day saline lavage and Mucinex as needed He is given an injection of vitamin B12 for his peripheral neuropathy  which is stable He has an appointment for August 27 at which time we will monitor his B12 level and a CBC

## 2011-05-20 ENCOUNTER — Ambulatory Visit: Payer: Medicare Other | Admitting: Internal Medicine

## 2011-06-03 ENCOUNTER — Ambulatory Visit (INDEPENDENT_AMBULATORY_CARE_PROVIDER_SITE_OTHER): Payer: Medicare Other | Admitting: Internal Medicine

## 2011-06-03 DIAGNOSIS — E538 Deficiency of other specified B group vitamins: Secondary | ICD-10-CM

## 2011-06-03 MED ORDER — CYANOCOBALAMIN 1000 MCG/ML IJ SOLN
1000.0000 ug | Freq: Once | INTRAMUSCULAR | Status: AC
  Administered 2011-06-03: 1000 ug via INTRAMUSCULAR

## 2011-06-24 ENCOUNTER — Ambulatory Visit (INDEPENDENT_AMBULATORY_CARE_PROVIDER_SITE_OTHER): Payer: Medicare Other | Admitting: Internal Medicine

## 2011-06-24 DIAGNOSIS — D518 Other vitamin B12 deficiency anemias: Secondary | ICD-10-CM

## 2011-06-24 DIAGNOSIS — D519 Vitamin B12 deficiency anemia, unspecified: Secondary | ICD-10-CM

## 2011-06-24 MED ORDER — CYANOCOBALAMIN 1000 MCG/ML IJ SOLN
1000.0000 ug | INTRAMUSCULAR | Status: DC
  Administered 2011-06-24: 1000 ug via INTRAMUSCULAR

## 2011-07-14 ENCOUNTER — Ambulatory Visit (INDEPENDENT_AMBULATORY_CARE_PROVIDER_SITE_OTHER): Payer: Medicare Other | Admitting: Internal Medicine

## 2011-07-14 VITALS — BP 140/80 | HR 76 | Temp 98.2°F | Resp 16 | Ht 72.0 in | Wt 202.0 lb

## 2011-07-14 DIAGNOSIS — G4700 Insomnia, unspecified: Secondary | ICD-10-CM

## 2011-07-14 DIAGNOSIS — K589 Irritable bowel syndrome without diarrhea: Secondary | ICD-10-CM

## 2011-07-14 DIAGNOSIS — J320 Chronic maxillary sinusitis: Secondary | ICD-10-CM

## 2011-07-14 DIAGNOSIS — I10 Essential (primary) hypertension: Secondary | ICD-10-CM

## 2011-07-14 DIAGNOSIS — IMO0002 Reserved for concepts with insufficient information to code with codable children: Secondary | ICD-10-CM

## 2011-07-14 DIAGNOSIS — D519 Vitamin B12 deficiency anemia, unspecified: Secondary | ICD-10-CM

## 2011-07-14 DIAGNOSIS — D518 Other vitamin B12 deficiency anemias: Secondary | ICD-10-CM

## 2011-07-14 MED ORDER — CYANOCOBALAMIN 1000 MCG/ML IJ SOLN
1000.0000 ug | INTRAMUSCULAR | Status: DC
  Administered 2011-07-14: 1000 ug via INTRAMUSCULAR

## 2011-07-14 MED ORDER — TEMAZEPAM 15 MG PO CAPS: 15.0000 mg | ORAL_CAPSULE | Freq: Every evening | ORAL | Status: DC | PRN

## 2011-07-14 NOTE — Patient Instructions (Signed)
You have symptoms that are consistent with lactose intolerant you can try Lactaid prior to dairy Products to see if that will help but if you eat ice cream you will have diarrhea

## 2011-07-17 ENCOUNTER — Encounter: Payer: Self-pay | Admitting: Internal Medicine

## 2011-07-17 NOTE — Progress Notes (Signed)
Subjective:    Patient ID: Timothy Gallagher, male    DOB: November 10, 1936, 75 y.o.   MRN: [id number]  HPI Patient presents for internal medicine followup.  This is reason for this office visit was followup of his hypertension and osteoarthritis however he has had recent visit with gastroenterology for abdominal pain and we noted in the visit diagnoses that there is a diagnosis of cancer phobia.  This patient does have anxiety over his health which results in an abnormal concern about minor pain.  He has had costochondritis was given concern about cardiovascular disease he has had abdominal pain and he is greatly concerned about cancer.  He's had appropriate workup and monitoring by both his gastroenterologist and our office and we attempted to reassure him that he appears to be healthy by our monitoring but that he has a significant impact on his health by exercise eating correctly keeping his weight is intact taking his medication as scheduled and not intermittently or on a when necessary basis   Review of Systems  Constitutional: Negative for fever and fatigue.  HENT: Negative for hearing loss, congestion, neck pain and postnasal drip.   Eyes: Negative for discharge, redness and visual disturbance.  Respiratory: Negative for cough, shortness of breath and wheezing.   Cardiovascular: Negative for leg swelling.  Gastrointestinal: Negative for abdominal pain, constipation and abdominal distention.  Genitourinary: Negative for urgency and frequency.  Musculoskeletal: Negative for joint swelling and arthralgias.  Skin: Negative for color change and rash.  Neurological: Negative for weakness and light-headedness.  Hematological: Negative for adenopathy.  Psychiatric/Behavioral: Negative for behavioral problems.   Past Medical History  Diagnosis Date  . ACNE ROSACEA 06/27/2009  . ACTINIC KERATOSIS, HEAD 04/18/2009  . Acute maxillary sinusitis 05/14/2010  . ALLERGIC RHINITIS 04/09/2007  . B12  DEFICIENCY 06/07/2007  . BACK PAIN WITH RADICULOPATHY 04/24/2008  . CHEST WALL PAIN, ACUTE 06/15/2009  . Chronic maxillary sinusitis 05/29/2008  . COLITIS 04/27/2009  . COLONIC POLYPS, HX OF 04/27/2009    tubular adenomas  . DERMATITIS, ATOPIC 10/12/2007  . DIVERTICULOSIS, COLON 04/27/2009  . ECCHYMOSES, SPONTANEOUS 06/27/2008  . Elevated sedimentation rate 05/02/2009  . ESOPHAGEAL STRICTURE 04/27/2009  . GASTRITIS, CHRONIC 04/27/2009  . HYPERLIPIDEMIA 03/13/2008  . HYPERTENSION 04/09/2007  . Irritable bowel syndrome 08/11/2007  . KIDNEY DISEASE 04/09/2007  . NECK PAIN 09/14/2008  . NEUROPATHY, IDIOPATHIC PERIPHERAL NEC 08/11/2007  . OSTEOARTHRITIS, WRIST, RIGHT 08/05/2010   Past Surgical History  Procedure Date  . Hernia repair   . Lamenectomy   . Lumbar fusion   . Tonsillectomy     reports that he quit smoking about 9 years ago. He has never used smokeless tobacco. He reports that he does not drink alcohol or use illicit drugs. family history includes COPD in his father; Heart disease in his father; and Hernia in his mother.  There is no history of Colon cancer. Allergies  Allergen Reactions  . Amoxicillin     REACTION: unspecified  . Atorvastatin     REACTION: nausea and blurred vision  . Ciprofloxacin     REACTION: unspecified  . Mycophenolate Mofetil     REACTION: unspecified  . Prednisone     REACTION: unspecified  . Rosuvastatin        Objective:   Physical Exam  Nursing note and vitals reviewed. Constitutional: He appears well-developed and well-nourished.  HENT:  Head: Normocephalic and atraumatic.  Eyes: Conjunctivae are normal. Pupils are equal, round, and reactive to light.  Neck: Normal range  of motion. Neck supple.  Cardiovascular: Normal rate and regular rhythm.   Pulmonary/Chest: Effort normal and breath sounds normal.  Abdominal: Soft. Bowel sounds are normal.          Assessment & Plan:  Generally he is doing well at this time.  His irritable bowel  pattern is stable and we reviewed the need to keep hydrated and use fiber in his diet.  His blood pressure is well controlled on a second blood pressure check shows blood pressure to be 130/80 his anxiety has been better recently and we've reassured him about his health status.  We reviewed his blood pressure medications his cholesterol medications and the B12 that he takes for peripheral neuropathy.  He was given B12 shot today he'll return in 3 months

## 2011-07-31 ENCOUNTER — Telehealth: Payer: Self-pay | Admitting: *Deleted

## 2011-07-31 MED ORDER — DIPHENOXYLATE-ATROPINE 2.5-0.025 MG PO TABS: 1.0000 | ORAL_TABLET | Freq: Four times a day (QID) | ORAL | Status: DC | PRN

## 2011-07-31 MED ORDER — NADOLOL 20 MG PO TABS: ORAL_TABLET | ORAL | Status: DC

## 2011-07-31 NOTE — Telephone Encounter (Signed)
Pt informed and med sent in 

## 2011-07-31 NOTE — Telephone Encounter (Signed)
Mucinex fast max cough and cold for one dose 4 times a day and may refill other medications

## 2011-07-31 NOTE — Telephone Encounter (Signed)
Pt. Is complaining of post nasal drainage, with congestion, and cough.  Please call in RX.  Pt also needs Lomotil and Nadolol sent to mail order pharmacy.

## 2011-08-05 ENCOUNTER — Other Ambulatory Visit: Payer: Self-pay | Admitting: *Deleted

## 2011-08-05 ENCOUNTER — Ambulatory Visit (INDEPENDENT_AMBULATORY_CARE_PROVIDER_SITE_OTHER): Payer: Medicare Other | Admitting: Internal Medicine

## 2011-08-05 DIAGNOSIS — D518 Other vitamin B12 deficiency anemias: Secondary | ICD-10-CM

## 2011-08-05 DIAGNOSIS — D519 Vitamin B12 deficiency anemia, unspecified: Secondary | ICD-10-CM

## 2011-08-05 MED ORDER — DIPHENOXYLATE-ATROPINE 2.5-0.025 MG PO TABS: 1.0000 | ORAL_TABLET | Freq: Four times a day (QID) | ORAL | Status: DC | PRN

## 2011-08-05 MED ORDER — NADOLOL 20 MG PO TABS: ORAL_TABLET | ORAL | Status: DC

## 2011-08-05 MED ORDER — CYANOCOBALAMIN 1000 MCG/ML IJ SOLN
1000.0000 ug | INTRAMUSCULAR | Status: DC
  Administered 2011-08-05: 1000 ug via INTRAMUSCULAR

## 2011-08-05 MED ORDER — LOSARTAN POTASSIUM 50 MG PO TABS: 50.0000 mg | ORAL_TABLET | Freq: Every day | ORAL | Status: DC

## 2011-08-07 ENCOUNTER — Other Ambulatory Visit: Payer: Self-pay | Admitting: *Deleted

## 2011-08-07 MED ORDER — NADOLOL 20 MG PO TABS: ORAL_TABLET | ORAL | Status: DC

## 2011-08-07 MED ORDER — LOSARTAN POTASSIUM 50 MG PO TABS: 50.0000 mg | ORAL_TABLET | Freq: Every day | ORAL | Status: DC

## 2011-08-07 MED ORDER — DIPHENOXYLATE-ATROPINE 2.5-0.025 MG PO TABS: 1.0000 | ORAL_TABLET | Freq: Four times a day (QID) | ORAL | Status: DC | PRN

## 2011-08-11 ENCOUNTER — Telehealth: Payer: Self-pay | Admitting: *Deleted

## 2011-08-11 ENCOUNTER — Ambulatory Visit (INDEPENDENT_AMBULATORY_CARE_PROVIDER_SITE_OTHER): Payer: Medicare Other | Admitting: Internal Medicine

## 2011-08-11 ENCOUNTER — Encounter: Payer: Self-pay | Admitting: Internal Medicine

## 2011-08-11 VITALS — BP 130/80 | Temp 98.0°F | Wt 202.0 lb

## 2011-08-11 DIAGNOSIS — K589 Irritable bowel syndrome without diarrhea: Secondary | ICD-10-CM

## 2011-08-11 DIAGNOSIS — I10 Essential (primary) hypertension: Secondary | ICD-10-CM

## 2011-08-11 DIAGNOSIS — K2940 Chronic atrophic gastritis without bleeding: Secondary | ICD-10-CM

## 2011-08-11 DIAGNOSIS — R110 Nausea: Secondary | ICD-10-CM

## 2011-08-11 DIAGNOSIS — Z23 Encounter for immunization: Secondary | ICD-10-CM

## 2011-08-11 DIAGNOSIS — K222 Esophageal obstruction: Secondary | ICD-10-CM

## 2011-08-11 DIAGNOSIS — J309 Allergic rhinitis, unspecified: Secondary | ICD-10-CM

## 2011-08-11 DIAGNOSIS — Z0000 Encounter for general adult medical examination without abnormal findings: Secondary | ICD-10-CM

## 2011-08-11 NOTE — Patient Instructions (Signed)
Avoids foods high in acid such as tomatoes citrus juices, and spicy foods.  Avoid eating within two hours of lying down or before exercising.  Do not overheat.  Try smaller more frequent meals.  If symptoms persist, elevate the head of her bed 12 inches while sleeping.  Omnaris use daily Nexium use daily  Call or return to clinic prn if these symptoms worsen or fail to improve as anticipated.

## 2011-08-11 NOTE — Progress Notes (Signed)
  Subjective:    Patient ID: Timothy Gallagher, male    DOB: 04/21/1936, 74 y.o.   MRN: [id number]  HPI   75 year old patient who presents with a six-day history of nausea. Denies any vomiting or change in his bowel habits. He does have a history of IBS and some chronic diarrhea which has been unchanged. He has a history of allergic rhinitis and he feels the nausea is related to excessive postnasal drip. He is on Astepro  as well as OTC antihistamine. No fever or purulent drainage. He has a history of chronic gastritis as well as an esophageal stricture. He is not taking a PPI.    Review of Systems  Constitutional: Negative for fever, chills, appetite change and fatigue.  HENT: Positive for rhinorrhea and postnasal drip. Negative for hearing loss, ear pain, congestion, sore throat, trouble swallowing, neck stiffness, dental problem, voice change and tinnitus.   Eyes: Negative for pain, discharge and visual disturbance.  Respiratory: Negative for cough, chest tightness, wheezing and stridor.   Cardiovascular: Negative for chest pain, palpitations and leg swelling.  Gastrointestinal: Positive for nausea. Negative for vomiting, abdominal pain, diarrhea, constipation, blood in stool and abdominal distention.  Genitourinary: Negative for urgency, hematuria, flank pain, discharge, difficulty urinating and genital sores.  Musculoskeletal: Negative for myalgias, back pain, joint swelling, arthralgias and gait problem.  Skin: Negative for rash.  Neurological: Negative for dizziness, syncope, speech difficulty, weakness, numbness and headaches.  Hematological: Negative for adenopathy. Does not bruise/bleed easily.  Psychiatric/Behavioral: Negative for behavioral problems and dysphoric mood. The patient is not nervous/anxious.        Objective:   Physical Exam  Constitutional: He is oriented to person, place, and time. He appears well-developed.  HENT:  Head: Normocephalic.  Right Ear: External ear  normal.  Left Ear: External ear normal.  Eyes: Conjunctivae and EOM are normal.  Neck: Normal range of motion.  Cardiovascular: Normal rate and normal heart sounds.   Pulmonary/Chest: Breath sounds normal.  Abdominal: Bowel sounds are normal.  Musculoskeletal: Normal range of motion. He exhibits no edema and no tenderness.  Neurological: He is alert and oriented to person, place, and time.  Psychiatric: He has a normal mood and affect. His behavior is normal.          Assessment & Plan:   Nausea. Possibly related to increased rhinorrhea. Will add a nasal steroid to his regimen and samples were provided. We'll also empirically add a short-term PPI IBS History of chronic gastritis and esophageal stricture  He will call if he does not promptly improve. He was placed on antireflux regimen

## 2011-08-11 NOTE — Telephone Encounter (Signed)
Probably not the medication. Okay to take again tomorrow

## 2011-08-11 NOTE — Telephone Encounter (Signed)
Pt. Saw Dr. Kirtland Bouchard this am , and was given Nexium.  States he took one pill and has been vomiting and had diarrhea since.  Does Dr. Kirtland Bouchard think it is from the Nexium?

## 2011-08-12 NOTE — Telephone Encounter (Signed)
Notified pt. 

## 2011-08-19 ENCOUNTER — Other Ambulatory Visit: Payer: Self-pay | Admitting: *Deleted

## 2011-08-19 LAB — CBC
HCT: 38.6 — ABNORMAL LOW
Hemoglobin: 12.9 — ABNORMAL LOW
MCHC: 33.3
MCV: 88.4
Platelets: 239
RBC: 4.37
RDW: 13.9
WBC: 6.2

## 2011-08-19 LAB — DIFFERENTIAL
Basophils Absolute: 0
Basophils Relative: 0
Eosinophils Absolute: 0.1
Eosinophils Relative: 2
Lymphocytes Relative: 25
Lymphs Abs: 1.6
Monocytes Absolute: 0.4
Monocytes Relative: 7
Neutro Abs: 4
Neutrophils Relative %: 65

## 2011-08-19 LAB — BASIC METABOLIC PANEL
BUN: 15
CO2: 28
Calcium: 8.3 — ABNORMAL LOW
Chloride: 109
Creatinine, Ser: 0.86
GFR calc Af Amer: >60
GFR calc non Af Amer: >60
Glucose, Bld: 107 — ABNORMAL HIGH
Potassium: 4.3
Sodium: 142

## 2011-08-19 MED ORDER — NADOLOL 20 MG PO TABS: ORAL_TABLET | ORAL | Status: DC

## 2011-08-26 ENCOUNTER — Ambulatory Visit (INDEPENDENT_AMBULATORY_CARE_PROVIDER_SITE_OTHER): Payer: Medicare Other | Admitting: Internal Medicine

## 2011-08-26 DIAGNOSIS — D518 Other vitamin B12 deficiency anemias: Secondary | ICD-10-CM

## 2011-08-26 DIAGNOSIS — D519 Vitamin B12 deficiency anemia, unspecified: Secondary | ICD-10-CM

## 2011-08-26 MED ORDER — CYANOCOBALAMIN 1000 MCG/ML IJ SOLN
1000.0000 ug | INTRAMUSCULAR | Status: DC
  Administered 2011-08-26: 1500 ug via INTRAMUSCULAR

## 2011-09-02 ENCOUNTER — Telehealth: Payer: Self-pay | Admitting: *Deleted

## 2011-09-02 NOTE — Telephone Encounter (Signed)
Notified pt. 

## 2011-09-02 NOTE — Telephone Encounter (Signed)
Take bp first thing in am and then before supper for 1 week and let us k now the readings--per dr [name]

## 2011-09-02 NOTE — Telephone Encounter (Signed)
Pt took BP this am x 2 and it was 189/92,  and 137/85.

## 2011-09-09 ENCOUNTER — Telehealth: Payer: Self-pay | Admitting: *Deleted

## 2011-09-09 NOTE — Telephone Encounter (Signed)
Patient has not been keeping daily BP readings [as asked to 09/02/11 note]. Pt did state that readings have been in the range of this mornings reading around 9am of 139/87. Pt also stated that when taking at 8AM he was having "pressure feeling" in top of head and a 155/90 reading but that it has continued to drop this morning. Patient informed to continue to take increased dosage as recommended by MD last week until he gets response from physician and/or nurse.

## 2011-09-09 NOTE — Telephone Encounter (Signed)
Talked with pt and bp are running around 155/90 and 136/80-will continue to take and give [place] reading when he comes next Tuesday for b12 injection

## 2011-09-16 ENCOUNTER — Ambulatory Visit (INDEPENDENT_AMBULATORY_CARE_PROVIDER_SITE_OTHER): Payer: Medicare Other | Admitting: Internal Medicine

## 2011-09-16 DIAGNOSIS — D519 Vitamin B12 deficiency anemia, unspecified: Secondary | ICD-10-CM

## 2011-09-16 DIAGNOSIS — D518 Other vitamin B12 deficiency anemias: Secondary | ICD-10-CM

## 2011-09-16 MED ORDER — CYANOCOBALAMIN 1000 MCG/ML IJ SOLN
1000.0000 ug | INTRAMUSCULAR | Status: DC
  Administered 2011-09-16: 1500 ug via INTRAMUSCULAR
  Administered 2011-10-08: 1000 ug via INTRAMUSCULAR

## 2011-09-26 ENCOUNTER — Telehealth: Payer: Self-pay | Admitting: Gastroenterology

## 2011-09-26 ENCOUNTER — Encounter: Payer: Self-pay | Admitting: Gastroenterology

## 2011-09-26 ENCOUNTER — Ambulatory Visit (INDEPENDENT_AMBULATORY_CARE_PROVIDER_SITE_OTHER): Payer: Medicare Other | Admitting: Gastroenterology

## 2011-09-26 VITALS — BP 158/96 | HR 60 | Ht 73.0 in | Wt 202.8 lb

## 2011-09-26 DIAGNOSIS — K6289 Other specified diseases of anus and rectum: Secondary | ICD-10-CM | POA: Insufficient documentation

## 2011-09-26 DIAGNOSIS — K589 Irritable bowel syndrome without diarrhea: Secondary | ICD-10-CM

## 2011-09-26 MED ORDER — HYDROCORTISONE ACE-PRAMOXINE 2.5-1 % RE CREA: TOPICAL_CREAM | Freq: Three times a day (TID) | RECTAL | Status: DC

## 2011-09-26 NOTE — Telephone Encounter (Signed)
Pt called said Rx was sent to the wrong pharmacy. I resent medication (analpram) to the correct pharmacy. Called an notified patient.

## 2011-09-26 NOTE — Patient Instructions (Signed)
We have sent the following medications to your pharmacy for you to pick up at your convenience: analpram cream.  CC: Timothy Gallagher.

## 2011-09-26 NOTE — Progress Notes (Signed)
This is a 75 year old Caucasian male with diarrhea predominant IBS. He is doing well except for some minor rectal discomfort but no rectal bleeding. Last colonoscopy was January of 2011. Patient has a long history of functional GI disorder the multiple drug allergies. He does have mild constipation but does not require a laxative therapy.  Current Medications, Allergies, Past Medical History, Past Surgical History, Family History and Social History were reviewed in [employer] record.  Pertinent Review of Systems Negative   Physical Exam: Awake and alert in no distress appearing his stated age. Abdominal exam shows no organomegaly, masses or tenderness. Bowel sounds are normal. Inspection of the rectum shows some right mucosal thickening anteriorly without a definite fissure or fistula. Rectal exam shows no masses or tenderness but mild smooth prostate enlargement. Stool is guaiac negative.    Assessment and Plan: Local perianal irritation without evidence of a pararectal abscess or other abnormalities. I prescribed twice a day and Analpram cream as needed, continued high fiber diet liberal by mouth fluids. He is to continue other medications as per primary care, and these were reviewed with the patient.

## 2011-09-30 ENCOUNTER — Telehealth: Payer: Self-pay | Admitting: Gastroenterology

## 2011-09-30 MED ORDER — HYDROCORTISONE 0.5 % EX OINT: TOPICAL_OINTMENT | CUTANEOUS | Status: DC

## 2011-09-30 NOTE — Telephone Encounter (Signed)
Pt states Analpram is too expensive; ok to order hydrocortisone 0.5% cream instead? Thanks.

## 2011-09-30 NOTE — Telephone Encounter (Signed)
Spoke to pt to inform him I will be ordering Hydrocortisone cream and that the computer states he's allergic to prednisone- pt states it makes him crazy and he eats a lot. Informed pt this is a low dose and it's topical, but to call for problems; pt stated understanding.

## 2011-09-30 NOTE — Telephone Encounter (Signed)
yes

## 2011-10-08 ENCOUNTER — Encounter: Payer: Self-pay | Admitting: Internal Medicine

## 2011-10-08 ENCOUNTER — Ambulatory Visit (INDEPENDENT_AMBULATORY_CARE_PROVIDER_SITE_OTHER): Payer: Medicare Other | Admitting: Internal Medicine

## 2011-10-08 VITALS — BP 140/84 | HR 64 | Temp 98.3°F | Resp 16 | Ht 73.0 in | Wt 200.0 lb

## 2011-10-08 DIAGNOSIS — D518 Other vitamin B12 deficiency anemias: Secondary | ICD-10-CM

## 2011-10-08 DIAGNOSIS — D519 Vitamin B12 deficiency anemia, unspecified: Secondary | ICD-10-CM

## 2011-10-08 DIAGNOSIS — J3089 Other allergic rhinitis: Secondary | ICD-10-CM

## 2011-10-08 DIAGNOSIS — I10 Essential (primary) hypertension: Secondary | ICD-10-CM

## 2011-10-08 DIAGNOSIS — J449 Chronic obstructive pulmonary disease, unspecified: Secondary | ICD-10-CM

## 2011-10-08 LAB — CBC WITH DIFFERENTIAL/PLATELET
Basophils Absolute: 0 10*3/uL (ref 0.0–0.1)
Basophils Relative: 0.7 % (ref 0.0–3.0)
Eosinophils Absolute: 0.2 10*3/uL (ref 0.0–0.7)
Eosinophils Relative: 4.3 % (ref 0.0–5.0)
HCT: 39.6 % (ref 39.0–52.0)
Hemoglobin: 13.2 g/dL (ref 13.0–17.0)
Lymphocytes Relative: 44.1 % (ref 12.0–46.0)
Lymphs Abs: 2.2 10*3/uL (ref 0.7–4.0)
MCHC: 33.4 g/dL (ref 30.0–36.0)
MCV: 87.6 fl (ref 78.0–100.0)
Monocytes Absolute: 0.6 10*3/uL (ref 0.1–1.0)
Monocytes Relative: 12 % (ref 3.0–12.0)
Neutro Abs: 2 10*3/uL (ref 1.4–7.7)
Neutrophils Relative %: 38.9 % — ABNORMAL LOW (ref 43.0–77.0)
Platelets: 187 10*3/uL (ref 150.0–400.0)
RBC: 4.52 Mil/uL (ref 4.22–5.81)
RDW: 14.2 % (ref 11.5–14.6)
WBC: 5 10*3/uL (ref 4.5–10.5)

## 2011-10-08 MED ORDER — CYANOCOBALAMIN 1000 MCG/ML IJ SOLN: 1500.0000 ug | Freq: Once | INTRAMUSCULAR | Status: DC

## 2011-10-08 MED ORDER — LOSARTAN POTASSIUM 50 MG PO TABS: 50.0000 mg | ORAL_TABLET | Freq: Two times a day (BID) | ORAL | Status: DC

## 2011-10-08 MED ORDER — INDAPAMIDE 1.25 MG PO TABS: 0.6250 mg | ORAL_TABLET | Freq: Two times a day (BID) | ORAL | Status: DC

## 2011-10-08 MED ORDER — NADOLOL 20 MG PO TABS: 10.0000 mg | ORAL_TABLET | Freq: Two times a day (BID) | ORAL | Status: DC

## 2011-10-08 NOTE — Patient Instructions (Signed)
The patient is instructed to continue all medications as prescribed. Schedule followup with check out clerk upon leaving the clinic  

## 2011-10-13 ENCOUNTER — Ambulatory Visit: Payer: Medicare Other | Admitting: Internal Medicine

## 2011-10-13 NOTE — Progress Notes (Signed)
Subjective:    Patient ID: Timothy Gallagher, male    DOB: February 19, 1936, 75 y.o.   MRN: [id number]  HPI  Patient is a 75 year old white male with multiple medical problems is followed for hypertension hyperlipidemia and irritable bowel syndrome he also has peripheral neuropathy and is felt to be secondary to B12 deficiency versus idiopathic peripheral neuropathy.  His blood pressure is adequately controlled he continues to use a combination of omega 3 fatty acids and red yeast rice for control of his cholesterol to 2 intolerance of statins.  He intermittently uses probiotics for his irritable bowel and is variable in his use of fiber  Review of Systems  Constitutional: Negative for fever and fatigue.  HENT: Negative for hearing loss, congestion, neck pain and postnasal drip.   Eyes: Negative for discharge, redness and visual disturbance.  Respiratory: Negative for cough, shortness of breath and wheezing.   Cardiovascular: Negative for leg swelling.  Gastrointestinal: Negative for abdominal pain, constipation and abdominal distention.  Genitourinary: Negative for urgency and frequency.  Musculoskeletal: Negative for joint swelling and arthralgias.  Skin: Negative for color change and rash.  Neurological: Negative for weakness and light-headedness.  Hematological: Negative for adenopathy.  Psychiatric/Behavioral: Negative for behavioral problems.   Past Medical History  Diagnosis Date  . ACNE ROSACEA 06/27/2009  . ACTINIC KERATOSIS, HEAD 04/18/2009  . Acute maxillary sinusitis 05/14/2010  . ALLERGIC RHINITIS 04/09/2007  . B12 DEFICIENCY 06/07/2007  . BACK PAIN WITH RADICULOPATHY 04/24/2008  . CHEST WALL PAIN, ACUTE 06/15/2009  . Chronic maxillary sinusitis 05/29/2008  . COLITIS 04/27/2009  . COLONIC POLYPS, HX OF 04/27/2009    tubular adenomas  . DERMATITIS, ATOPIC 10/12/2007  . DIVERTICULOSIS, COLON 04/27/2009  . ECCHYMOSES, SPONTANEOUS 06/27/2008  . Elevated sedimentation rate 05/02/2009  .  ESOPHAGEAL STRICTURE 04/27/2009  . GASTRITIS, CHRONIC 04/27/2009  . HYPERLIPIDEMIA 03/13/2008  . HYPERTENSION 04/09/2007  . Irritable bowel syndrome 08/11/2007  . KIDNEY DISEASE 04/09/2007  . NECK PAIN 09/14/2008  . NEUROPATHY, IDIOPATHIC PERIPHERAL NEC 08/11/2007  . OSTEOARTHRITIS, WRIST, RIGHT 08/05/2010    History   Social History  . Marital Status: Married    Spouse Name: N/A    Number of Children: 4  . Years of Education: N/A   Occupational History  . retired     from [employer]   Social History Main Topics  . Smoking status: Former Smoker    Quit date: 11/17/2001  . Smokeless tobacco: Never Used  . Alcohol Use: No  . Drug Use: No  . Sexually Active: Yes   Other Topics Concern  . Not on file   Social History Narrative  . No narrative on file    Past Surgical History  Procedure Date  . Hernia repair   . Lamenectomy   . Lumbar fusion   . Tonsillectomy     Family History  Problem Relation Age of Onset  . Hernia Mother   . Heart disease Father   . COPD Father   . Colon cancer Neg Hx     Allergies  Allergen Reactions  . Amoxicillin     REACTION: unspecified  . Atorvastatin     REACTION: nausea and blurred vision  . Ciprofloxacin     REACTION: unspecified  . Mycophenolate Mofetil     REACTION: unspecified  . Prednisone     REACTION: unspecified  . Rosuvastatin     Current Outpatient Prescriptions on File Prior to Visit  Medication Sig Dispense Refill  . acetaminophen (TYLENOL) 500 MG tablet  Take 500 mg by mouth every 6 (six) hours as needed.        . cyanocobalamin 1000 MCG/ML injection Inject into the muscle. 1.1ml every 3 weeks       . diphenoxylate-atropine (LOMOTIL) 2.5-0.025 MG per tablet Take 1 tablet by mouth 4 (four) times daily as needed.  30 tablet  1  . fish oil-omega-3 fatty acids 1000 MG capsule Take 2 g by mouth daily.        . hydrocortisone 0.5 % ointment Apply to rectal area 3 times daily as needed for hemorrhoids.  30 g  1  .  Multiple Vitamin (MULTIVITAMIN) capsule Take 1 capsule by mouth daily.        . nadolol (CORGARD) 20 MG tablet Take 0.5 tablets (10 mg total) by mouth 2 (two) times daily. 1/2 tab twice daily  90 tablet  3  . Nasal Moisturizer Combination SOLN 1 puff by Nasal route 2 (two) times daily.        . Red Yeast Rice 600 MG CAPS Take 1 capsule by mouth 2 (two) times daily.        No current facility-administered medications on file prior to visit.    BP 140/84  Pulse 64  Temp 98.3 F (36.8 C)  Resp 16  Ht 6\' 1"  (1.854 m)  Wt 200 lb (90.719 kg)  BMI 26.39 kg/m2       Objective:   Physical Exam  Nursing note and vitals reviewed. Constitutional: He appears well-developed and well-nourished.  HENT:  Head: Normocephalic and atraumatic.  Eyes: Conjunctivae are normal. Pupils are equal, round, and reactive to light.  Neck: Normal range of motion. Neck supple.  Cardiovascular: Normal rate and regular rhythm.   Pulmonary/Chest: Effort normal and breath sounds normal.  Abdominal: Soft. Bowel sounds are normal.          Assessment & Plan:  Monitor her lipid control on the omega -3 and red rice yeast Blood pressure stable on current medications Reviewed diet urged increased fiber and compliance with probiotics Discuss chronic allergic rhinitis and use of saline lavage as well as nasal corticosteroid

## 2011-10-29 ENCOUNTER — Ambulatory Visit (INDEPENDENT_AMBULATORY_CARE_PROVIDER_SITE_OTHER): Payer: Medicare Other | Admitting: Internal Medicine

## 2011-10-29 DIAGNOSIS — D519 Vitamin B12 deficiency anemia, unspecified: Secondary | ICD-10-CM

## 2011-10-29 DIAGNOSIS — D518 Other vitamin B12 deficiency anemias: Secondary | ICD-10-CM

## 2011-10-29 MED ORDER — CYANOCOBALAMIN 1000 MCG/ML IJ SOLN
1000.0000 ug | Freq: Once | INTRAMUSCULAR | Status: AC
  Administered 2011-10-29: 1000 ug via INTRAMUSCULAR

## 2011-11-06 ENCOUNTER — Encounter: Payer: Self-pay | Admitting: Internal Medicine

## 2011-11-06 ENCOUNTER — Ambulatory Visit (INDEPENDENT_AMBULATORY_CARE_PROVIDER_SITE_OTHER): Payer: Medicare Other | Admitting: Internal Medicine

## 2011-11-06 DIAGNOSIS — I10 Essential (primary) hypertension: Secondary | ICD-10-CM

## 2011-11-06 DIAGNOSIS — M542 Cervicalgia: Secondary | ICD-10-CM

## 2011-11-06 DIAGNOSIS — R809 Proteinuria, unspecified: Secondary | ICD-10-CM

## 2011-11-06 MED ORDER — TRAMADOL HCL 50 MG PO TABS: 50.0000 mg | ORAL_TABLET | Freq: Three times a day (TID) | ORAL | Status: AC | PRN

## 2011-11-06 NOTE — Progress Notes (Signed)
  Subjective:    Patient ID: Timothy Gallagher, male    DOB: 1936-10-16, 75 y.o.   MRN: [id number]  HPI  75 year old patient who presents with 5 day history of left hemi-facial pain. He does have a history of chronic sinusitis. He was concerned about recurrent sinus infection. He also noted pain in the left posterior neck area. He is improved today and almost canceled the appointment. Has some mild sinus congestion but no focal sinus pain or dental pain. No fever or purulent drainage    Review of Systems  Constitutional: Negative for fever, chills, appetite change and fatigue.  HENT: Positive for ear pain, congestion and neck pain. Negative for hearing loss, sore throat, trouble swallowing, neck stiffness, dental problem, voice change and tinnitus.   Eyes: Negative for pain, discharge and visual disturbance.  Respiratory: Negative for cough, chest tightness, wheezing and stridor.   Cardiovascular: Negative for chest pain, palpitations and leg swelling.  Gastrointestinal: Negative for nausea, vomiting, abdominal pain, diarrhea, constipation, blood in stool and abdominal distention.  Genitourinary: Negative for urgency, hematuria, flank pain, discharge, difficulty urinating and genital sores.  Musculoskeletal: Negative for myalgias, back pain, joint swelling, arthralgias and gait problem.  Skin: Negative for rash.  Neurological: Negative for dizziness, syncope, speech difficulty, weakness, numbness and headaches.  Hematological: Negative for adenopathy. Does not bruise/bleed easily.  Psychiatric/Behavioral: Negative for behavioral problems and dysphoric mood. The patient is not nervous/anxious.        Objective:   Physical Exam  Constitutional: He is oriented to person, place, and time. He appears well-developed.  HENT:  Head: Normocephalic.  Right Ear: External ear normal.  Left Ear: External ear normal.  Mouth/Throat: Oropharynx is clear and moist.       No sinus tenderness  Eyes:  Conjunctivae and EOM are normal.  Neck: Normal range of motion.        tenderness involving the left posterior neck area  Cardiovascular: Normal rate and normal heart sounds.   Pulmonary/Chest: Breath sounds normal.  Abdominal: Soft. Bowel sounds are normal.  Musculoskeletal: Normal range of motion. He exhibits no edema and no tenderness.  Neurological: He is alert and oriented to person, place, and time.  Psychiatric: He has a normal mood and affect. His behavior is normal.          Assessment & Plan:   Left hemi-facial and neck pain. Believe this is muscular ligamentous. No sinus infection. We'll treat symptomatically with tramadol and observed if he is worse tomorrow he will call and we'll consider antibiotic therapy in view of his history of chronic recurrent sinusiti  hypertension well controlled

## 2011-11-06 NOTE — Patient Instructions (Signed)
Call or return to clinic prn if these symptoms worsen or fail to improve as anticipated.

## 2011-11-07 ENCOUNTER — Telehealth: Payer: Self-pay | Admitting: *Deleted

## 2011-11-07 MED ORDER — AZITHROMYCIN 250 MG PO TABS: ORAL_TABLET | ORAL | Status: AC

## 2011-11-07 MED ORDER — HYDROCODONE-ACETAMINOPHEN 5-500 MG PO TABS: 1.0000 | ORAL_TABLET | Freq: Four times a day (QID) | ORAL | Status: DC | PRN

## 2011-11-07 NOTE — Telephone Encounter (Signed)
Pt states that the pills didn't help last night. Headache last till 3am. He would like something else called in.

## 2011-11-07 NOTE — Telephone Encounter (Signed)
Pt aware of this. Pt is also wanting antibiotic as well for the sinus. Pt would like a z-pac. Rx called in for the vicodin.   Per Dr. Kirtland Bouchard- [place] to do a z-pak. Rx sent to the pharmacy.

## 2011-11-07 NOTE — Telephone Encounter (Signed)
Take Aleve 200 mg twice daily for pain or swelling  Vicodin #30 one every 6 hours as needed for pain (5/500)

## 2011-11-19 ENCOUNTER — Ambulatory Visit (INDEPENDENT_AMBULATORY_CARE_PROVIDER_SITE_OTHER): Payer: Medicare Other | Admitting: Internal Medicine

## 2011-11-19 DIAGNOSIS — D519 Vitamin B12 deficiency anemia, unspecified: Secondary | ICD-10-CM

## 2011-11-19 DIAGNOSIS — D518 Other vitamin B12 deficiency anemias: Secondary | ICD-10-CM

## 2011-11-19 MED ORDER — CYANOCOBALAMIN 1000 MCG/ML IJ SOLN
1500.0000 ug | INTRAMUSCULAR | Status: DC
  Administered 2011-11-19: 1500 ug via INTRAMUSCULAR

## 2011-11-20 ENCOUNTER — Ambulatory Visit: Payer: Medicare Other | Admitting: Internal Medicine

## 2011-12-03 ENCOUNTER — Other Ambulatory Visit: Payer: Self-pay | Admitting: *Deleted

## 2011-12-11 ENCOUNTER — Ambulatory Visit: Payer: Medicare Other | Admitting: Internal Medicine

## 2011-12-16 ENCOUNTER — Ambulatory Visit (INDEPENDENT_AMBULATORY_CARE_PROVIDER_SITE_OTHER): Payer: Medicare Other | Admitting: Internal Medicine

## 2011-12-16 ENCOUNTER — Encounter: Payer: Self-pay | Admitting: Internal Medicine

## 2011-12-16 VITALS — BP 140/80 | HR 76 | Temp 98.2°F | Resp 16 | Ht 72.0 in | Wt 202.0 lb

## 2011-12-16 DIAGNOSIS — I10 Essential (primary) hypertension: Secondary | ICD-10-CM

## 2011-12-16 DIAGNOSIS — D518 Other vitamin B12 deficiency anemias: Secondary | ICD-10-CM

## 2011-12-16 DIAGNOSIS — D519 Vitamin B12 deficiency anemia, unspecified: Secondary | ICD-10-CM

## 2011-12-16 DIAGNOSIS — J320 Chronic maxillary sinusitis: Secondary | ICD-10-CM

## 2011-12-16 MED ORDER — CYANOCOBALAMIN 1000 MCG/ML IJ SOLN
1000.0000 ug | INTRAMUSCULAR | Status: DC
  Administered 2011-12-16: 1500 ug via INTRAMUSCULAR

## 2011-12-16 NOTE — Patient Instructions (Signed)
The patient is instructed to continue all medications as prescribed. Schedule followup with check out clerk upon leaving the clinic  

## 2011-12-23 ENCOUNTER — Telehealth: Payer: Self-pay | Admitting: *Deleted

## 2011-12-23 NOTE — Telephone Encounter (Signed)
Mailbox if full. 

## 2011-12-23 NOTE — Telephone Encounter (Signed)
Discussed with pt using the saline lavage.

## 2011-12-23 NOTE — Telephone Encounter (Signed)
Pt is calling stating the nasal spray helped his sinus, but he now very dizzy and feels unsteady on his feet.

## 2011-12-23 NOTE — Telephone Encounter (Signed)
Per dr Nigel Mormon sure he is using salt water lavage

## 2011-12-25 NOTE — Progress Notes (Signed)
Subjective:    Patient ID: Timothy Gallagher, male    DOB: Mar 19, 1936, 76 y.o.   MRN: [id number]  HPI This is a 76 year old white male followed for chronic sinusitis hyperlipidemia hypertension allergic rhinitis.  He is doing reasonably well he states he still has persistent nasal congestion but he has not had any recurrent sinus infection since he has been doing his saline sinus lavage.     Review of Systems  Constitutional: Negative for fever and fatigue.  HENT: Negative for hearing loss, congestion, neck pain and postnasal drip.   Eyes: Negative for discharge, redness and visual disturbance.  Respiratory: Negative for cough, shortness of breath and wheezing.   Cardiovascular: Negative for leg swelling.  Gastrointestinal: Negative for abdominal pain, constipation and abdominal distention.  Genitourinary: Negative for urgency and frequency.  Musculoskeletal: Negative for joint swelling and arthralgias.  Skin: Negative for color change and rash.  Neurological: Negative for weakness and light-headedness.  Hematological: Negative for adenopathy.  Psychiatric/Behavioral: Negative for behavioral problems.   Past Medical History  Diagnosis Date  . ACNE ROSACEA 06/27/2009  . ACTINIC KERATOSIS, HEAD 04/18/2009  . Acute maxillary sinusitis 05/14/2010  . ALLERGIC RHINITIS 04/09/2007  . B12 DEFICIENCY 06/07/2007  . BACK PAIN WITH RADICULOPATHY 04/24/2008  . CHEST WALL PAIN, ACUTE 06/15/2009  . Chronic maxillary sinusitis 05/29/2008  . COLITIS 04/27/2009  . COLONIC POLYPS, HX OF 04/27/2009    tubular adenomas  . DERMATITIS, ATOPIC 10/12/2007  . DIVERTICULOSIS, COLON 04/27/2009  . ECCHYMOSES, SPONTANEOUS 06/27/2008  . Elevated sedimentation rate 05/02/2009  . ESOPHAGEAL STRICTURE 04/27/2009  . GASTRITIS, CHRONIC 04/27/2009  . HYPERLIPIDEMIA 03/13/2008  . HYPERTENSION 04/09/2007  . Irritable bowel syndrome 08/11/2007  . KIDNEY DISEASE 04/09/2007  . NECK PAIN 09/14/2008  . NEUROPATHY, IDIOPATHIC  PERIPHERAL NEC 08/11/2007  . OSTEOARTHRITIS, WRIST, RIGHT 08/05/2010    History   Social History  . Marital Status: Married    Spouse Name: N/A    Number of Children: 4  . Years of Education: N/A   Occupational History  . retired     from [employer]   Social History Main Topics  . Smoking status: Former Smoker    Quit date: 11/17/2001  . Smokeless tobacco: Never Used  . Alcohol Use: No  . Drug Use: No  . Sexually Active: Yes   Other Topics Concern  . Not on file   Social History Narrative  . No narrative on file    Past Surgical History  Procedure Date  . Hernia repair   . Lamenectomy   . Lumbar fusion   . Tonsillectomy     Family History  Problem Relation Age of Onset  . Hernia Mother   . Heart disease Father   . COPD Father   . Colon cancer Neg Hx     Allergies  Allergen Reactions  . Amoxicillin     REACTION: unspecified  . Atorvastatin     REACTION: nausea and blurred vision  . Ciprofloxacin     REACTION: unspecified  . Mycophenolate Mofetil     REACTION: unspecified  . Prednisone     REACTION: unspecified  . Rosuvastatin     Current Outpatient Prescriptions on File Prior to Visit  Medication Sig Dispense Refill  . acetaminophen (TYLENOL) 500 MG tablet Take 500 mg by mouth every 6 (six) hours as needed.        . cyanocobalamin 1000 MCG/ML injection Inject into the muscle. 1.28ml every 3 weeks       .  diphenoxylate-atropine (LOMOTIL) 2.5-0.025 MG per tablet Take 1 tablet by mouth 4 (four) times daily as needed.  30 tablet  1  . fish oil-omega-3 fatty acids 1000 MG capsule Take 2 g by mouth daily.        [name] Kitchen HYDROcodone-acetaminophen (VICODIN) 5-500 MG per tablet Take 1 tablet by mouth every 6 (six) hours as needed for pain.  30 tablet  0  . hydrocortisone 0.5 % ointment Apply to rectal area 3 times daily as needed for hemorrhoids.  30 g  1  . indapamide (LOZOL) 1.25 MG tablet Take 0.5 tablets (0.625 mg total) by mouth 2 (two) times daily.  90 tablet   3  . losartan (COZAAR) 50 MG tablet Take 1 tablet (50 mg total) by mouth 2 (two) times daily.  60 tablet  3  . Multiple Vitamin (MULTIVITAMIN) capsule Take 1 capsule by mouth daily.        . nadolol (CORGARD) 20 MG tablet Take 0.5 tablets (10 mg total) by mouth 2 (two) times daily. 1/2 tab twice daily  90 tablet  3  . Nasal Moisturizer Combination SOLN 1 puff by Nasal route 2 (two) times daily.        . Red Yeast Rice 600 MG CAPS Take 1 capsule by mouth 2 (two) times daily.        Current Facility-Administered Medications on File Prior to Visit  Medication Dose Route Frequency Provider Last Rate Last Dose  . cyanocobalamin ((VITAMIN B-12)) injection 1,500 mcg  1,500 mcg Intramuscular Once Carrie Mew, MD      . cyanocobalamin ((VITAMIN B-12)) injection 1,500 mcg  1,500 mcg Intramuscular Q30 days Carrie Mew, MD   1,500 mcg at 11/19/11 1215    BP 140/80  Pulse 76  Temp 98.2 F (36.8 C)  Resp 16  Ht 6' (1.829 m)  Wt 202 lb (91.627 kg)  BMI 27.40 kg/m2       Objective:   Physical Exam  Constitutional: He appears well-developed and well-nourished.  HENT:  Head: Normocephalic and atraumatic.  Eyes: Conjunctivae are normal. Pupils are equal, round, and reactive to light.  Neck: Normal range of motion. Neck supple.  Cardiovascular: Normal rate and regular rhythm.   Pulmonary/Chest: Effort normal and breath sounds normal.  Abdominal: Soft. Bowel sounds are normal.          Assessment & Plan:  Stable with a sinus lavage with saline.  No evidence of recurrent sinusitis continue gavage blood pressure stable on current medications. Samples of nasal steroid spray given to the patient

## 2012-01-05 ENCOUNTER — Encounter: Payer: Self-pay | Admitting: Internal Medicine

## 2012-01-05 ENCOUNTER — Ambulatory Visit (INDEPENDENT_AMBULATORY_CARE_PROVIDER_SITE_OTHER): Payer: Medicare Other | Admitting: Internal Medicine

## 2012-01-05 VITALS — BP 122/80 | Temp 97.7°F | Wt 204.0 lb

## 2012-01-05 DIAGNOSIS — E538 Deficiency of other specified B group vitamins: Secondary | ICD-10-CM

## 2012-01-05 DIAGNOSIS — M722 Plantar fascial fibromatosis: Secondary | ICD-10-CM

## 2012-01-05 DIAGNOSIS — I10 Essential (primary) hypertension: Secondary | ICD-10-CM

## 2012-01-05 MED ORDER — CYANOCOBALAMIN 1000 MCG/ML IJ SOLN
1000.0000 ug | Freq: Once | INTRAMUSCULAR | Status: AC
  Administered 2012-01-05: 1000 ug via INTRAMUSCULAR

## 2012-01-05 NOTE — Patient Instructions (Signed)
Take Aleve 200 mg twice daily for pain or swelling  Call or return to clinic prn if these symptoms worsen or fail to improve as anticipated.  

## 2012-01-05 NOTE — Progress Notes (Signed)
  Subjective:    Patient ID: Timothy Gallagher, male    DOB: 1936-07-08, 76 y.o.   MRN: [id number]  HPI  76 year old patient who has treated hypertension. He presents with a one-week history of atraumatic left heel pain. Otherwise does quite well. He does take Tylenol 2 or 3 times daily.    Review of Systems  Musculoskeletal: Positive for gait problem.       Left heel pain       Objective:   Physical Exam  Constitutional: He appears well-developed and well-nourished. No distress.       Blood pressure 120/80  Musculoskeletal:       A left Achilles tendon was normal Slight tenderness over the plantar aspect of the left heel especially medially          Assessment & Plan:   Plantar fasciitis. Proper foot care discussed we'll try short-term anti-inflammatories Hypertension well controlled

## 2012-01-06 ENCOUNTER — Ambulatory Visit: Payer: Medicare Other | Admitting: Internal Medicine

## 2012-01-07 ENCOUNTER — Telehealth: Payer: Self-pay | Admitting: *Deleted

## 2012-01-07 NOTE — Telephone Encounter (Signed)
Talked with pt and he will make appointment with [facility]

## 2012-01-07 NOTE — Telephone Encounter (Signed)
Pt saw Dr. Kirtland Bouchard Monday with heel pain, and it is giving him a lot of pain.  Pt is afraid to take too much Aleve and Tylenol, but is afraid they will damage his kidneys.

## 2012-01-08 ENCOUNTER — Ambulatory Visit: Payer: Medicare Other | Admitting: Internal Medicine

## 2012-01-26 ENCOUNTER — Ambulatory Visit (INDEPENDENT_AMBULATORY_CARE_PROVIDER_SITE_OTHER): Payer: Medicare Other | Admitting: Internal Medicine

## 2012-01-26 DIAGNOSIS — D519 Vitamin B12 deficiency anemia, unspecified: Secondary | ICD-10-CM

## 2012-01-26 DIAGNOSIS — D518 Other vitamin B12 deficiency anemias: Secondary | ICD-10-CM

## 2012-01-26 MED ORDER — CYANOCOBALAMIN 1000 MCG/ML IJ SOLN
1500.0000 ug | INTRAMUSCULAR | Status: AC
  Administered 2012-01-26: 1500 ug via INTRAMUSCULAR

## 2012-02-12 ENCOUNTER — Encounter: Payer: Self-pay | Admitting: Family

## 2012-02-12 ENCOUNTER — Ambulatory Visit (INDEPENDENT_AMBULATORY_CARE_PROVIDER_SITE_OTHER): Payer: Medicare Other | Admitting: Family

## 2012-02-12 VITALS — BP 160/90 | Temp 97.8°F | Ht 72.0 in | Wt 202.0 lb

## 2012-02-12 DIAGNOSIS — N400 Enlarged prostate without lower urinary tract symptoms: Secondary | ICD-10-CM

## 2012-02-12 DIAGNOSIS — D519 Vitamin B12 deficiency anemia, unspecified: Secondary | ICD-10-CM

## 2012-02-12 DIAGNOSIS — D518 Other vitamin B12 deficiency anemias: Secondary | ICD-10-CM

## 2012-02-12 DIAGNOSIS — R350 Frequency of micturition: Secondary | ICD-10-CM

## 2012-02-12 LAB — POCT URINALYSIS DIPSTICK
Bilirubin, UA: NEGATIVE
Glucose, UA: NEGATIVE
Leukocytes, UA: NEGATIVE
Nitrite, UA: NEGATIVE
Spec Grav, UA: >=1.03
Urobilinogen, UA: 0.2
pH, UA: 5.5

## 2012-02-12 LAB — PSA: PSA: 3.16 ng/mL (ref 0.10–4.00)

## 2012-02-12 MED ORDER — CYANOCOBALAMIN 1000 MCG/ML IJ SOLN
1000.0000 ug | Freq: Once | INTRAMUSCULAR | Status: AC
  Administered 2012-02-12: 1000 ug via INTRAMUSCULAR

## 2012-02-12 MED ORDER — SILODOSIN 4 MG PO CAPS: 4.0000 mg | ORAL_CAPSULE | Freq: Every day | ORAL | Status: DC

## 2012-02-12 NOTE — Patient Instructions (Signed)
Benign Prostatic Hypertrophy   The prostate gland is part of the reproductive system of men. A normal prostate is about the size and shape of a walnut. The prostate gland makes a fluid that is mixed with sperm to make semen. This gland surrounds the urethra and is located in front of the rectum and just below the bladder. The bladder is where urine is stored. The urethra is the tube through which urine passes from the bladder to get out of the body. The prostate grows as a man ages. An enlarged prostate not caused by cancer is called benign prostatic hypertrophy (BPH). This is a common health problem in men over age 50. This condition is a normal part of aging. An enlarged prostate presses on the urethra. This makes it harder to pass urine. In the early stages of enlargement, the bladder can get by with a narrowed urethra by forcing the urine through. If the problem gets worse, medical or surgical treatment may be required.   This condition should be followed by your caregiver. Longstanding back pressure on the kidneys can cause infection. Back pressure and infection can progress to bladder damage and kidney (renal) failure. If needed, your caregiver may refer you to a specialist in kidney and prostate disease (urologist). CAUSES   The exact cause is not known.   SYMPTOMS    You are not able to completely empty your bladder.   Getting up often during the night to urinate.   Need to urinate frequently during the day.   Difficultly in starting urine flow.   Decrease in size and strength of the urine stream.   Dribbling after urination.   Pain on urination (more common with infection).   Inability to pass your water. This needs immediate treatment.  DIAGNOSIS   These tests will help your caregiver understand your problem:  Digital rectal exam (DRE). In a rectal exam, your caregiver checks your prostate by putting a gloved, lubricated finger into the rectum to feel the back of your prostate  gland. This exam detects the size of the gland and abnormal lumps or growths.   Urinalysis (exam of the urine). This may include a culture if there is concern about infection.   Prostate Specific Antigen (PSA). This is a blood test used to screen for prostate cancer. It is not used alone for diagnosing prostate cancer.   Rectal ultrasound (sonogram). This test uses sound waves to electronically produce a "picture" of the prostate. It helps examine the prostate gland for cancer.  TREATMENT   Mild symptoms may not need treatment. Simple observation and yearly exams may be all that is required. Medications and surgery are options for more severe problems. Your caregiver can help you make an informed decision for what is best. Two classes of medications are available for relief of prostate symptoms:  Medications that shrink the prostate. This helps relieve symptoms.   Uncommon side effects include problems with sexual function.   Medications to relax the muscle of the prostate. This also relieves the obstruction.   Side effects can include dizziness, fatigue, lightheadedness, and retrograde ejaculation (diminished volume of ejaculate).  Several types of surgical treatments are available for relief of prostate symptoms:  Transurethral resection of the prostate (TURP). In this treatment, an instrument is inserted through opening at the tip of the penis. It is used to cut away pieces of the inner core of the prostate. The pieces are removed through the same opening of the penis. This removes the   obstruction and helps get rid of the symptoms.   Transurethral incision (TUIP). In this procedure, small cuts are made in the prostate. This lessens the prostates pressure on the urethra.   Transurethral microwave thermotherapy (TUMT). This procedure uses microwaves to create heat. The heat destroys and removes a small amount of prostate tissue.   Transurethral needle ablation (TUNA). This is a procedure  that uses radio frequencies to do the same as TUMT.   Interstitial laser coagulation (ILC). This is a procedure that uses a laser to do the same as TUMT and TUNA.   Transurethral electrovaporization (TUVP). This is a procedure that uses electrodes to do the same as the procedures listed above.  Regardless of the method of treatment chosen, you and your caregiver will discuss the options. With this knowledge, you along with your caregiver can decide upon the best treatment for you. SEEK MEDICAL CARE IF:    You develop chills, fever of 100.5 F (38.1 C), or night sweats.   There is unexplained back pain.   Symptoms are not helped by medications prescribed.   You develop medication side effects.   Your urine becomes very dark or has a bad smell.  SEEK IMMEDIATE MEDICAL CARE IF:    You are suddenly unable to urinate. This is an emergency. You should be seen immediately.   There are large amounts of blood or clots in the urine.   Your urinary problems become unmanageable.   You develop lightheadedness, severe dizziness, or you feel faint.   You develop moderate to severe low back or flank pain.   You develop chills or fever.  Document Released: 11/03/2005 Document Revised: 10/23/2011 Document Reviewed: 07/26/2007 [employer] Patient Information 2012 [employer]. 

## 2012-02-12 NOTE — Progress Notes (Signed)
Subjective:    Patient ID: Timothy Gallagher, male    DOB: Dec 11, 1935, 76 y.o.   MRN: [id number]  HPI Comments: C/o urinary frequency and rt groin discomfort described as "aggravating" Ranked discomfort as 0/10 with no relieving factors or factors making the pain worse.  Consumes coffee in am and green tea throughout the day.  Denies nausea,poenile discharge, dysuria, fever, chills, swelling to groin, or odor. Denies h/o prostate problems. "I took rapid flow 3-4 years ago for the same problem." Stating last psa was normal.   Urinary Tract Infection  Associated symptoms include frequency. Pertinent negatives include no hematuria or urgency.    He has a history of proteinuria that is being managed by nephrology.   Review of Systems  Constitutional: Negative.   Respiratory: Negative.   Cardiovascular: Negative.   Genitourinary: Positive for frequency. Negative for dysuria, urgency, hematuria, decreased urine volume, discharge, penile swelling, scrotal swelling, genital sores, penile pain and testicular pain.   Past Medical History  Diagnosis Date  . ACNE ROSACEA 06/27/2009  . ACTINIC KERATOSIS, HEAD 04/18/2009  . Acute maxillary sinusitis 05/14/2010  . ALLERGIC RHINITIS 04/09/2007  . B12 DEFICIENCY 06/07/2007  . BACK PAIN WITH RADICULOPATHY 04/24/2008  . CHEST WALL PAIN, ACUTE 06/15/2009  . Chronic maxillary sinusitis 05/29/2008  . COLITIS 04/27/2009  . COLONIC POLYPS, HX OF 04/27/2009    tubular adenomas  . DERMATITIS, ATOPIC 10/12/2007  . DIVERTICULOSIS, COLON 04/27/2009  . ECCHYMOSES, SPONTANEOUS 06/27/2008  . Elevated sedimentation rate 05/02/2009  . ESOPHAGEAL STRICTURE 04/27/2009  . GASTRITIS, CHRONIC 04/27/2009  . HYPERLIPIDEMIA 03/13/2008  . HYPERTENSION 04/09/2007  . Irritable bowel syndrome 08/11/2007  . KIDNEY DISEASE 04/09/2007  . NECK PAIN 09/14/2008  . NEUROPATHY, IDIOPATHIC PERIPHERAL NEC 08/11/2007  . OSTEOARTHRITIS, WRIST, RIGHT 08/05/2010    History   Social History  . Marital  Status: Married    Spouse Name: N/A    Number of Children: 4  . Years of Education: N/A   Occupational History  . retired     from [employer]   Social History Main Topics  . Smoking status: Former Smoker    Quit date: 11/17/2001  . Smokeless tobacco: Never Used  . Alcohol Use: No  . Drug Use: No  . Sexually Active: Yes   Other Topics Concern  . Not on file   Social History Narrative  . No narrative on file    Past Surgical History  Procedure Date  . Hernia repair   . Lamenectomy   . Lumbar fusion   . Tonsillectomy     Family History  Problem Relation Age of Onset  . Hernia Mother   . Heart disease Father   . COPD Father   . Colon cancer Neg Hx     Allergies  Allergen Reactions  . Amoxicillin     REACTION: unspecified  . Atorvastatin     REACTION: nausea and blurred vision  . Ciprofloxacin     REACTION: unspecified  . Mycophenolate Mofetil     REACTION: unspecified  . Prednisone     REACTION: unspecified  . Rosuvastatin     Current Outpatient Prescriptions on File Prior to Visit  Medication Sig Dispense Refill  . acetaminophen (TYLENOL) 500 MG tablet Take 500 mg by mouth every 6 (six) hours as needed.        . cyanocobalamin 1000 MCG/ML injection Inject into the muscle. 1.69ml every 3 weeks       . diphenoxylate-atropine (LOMOTIL) 2.5-0.025 MG per tablet Take 1  tablet by mouth 4 (four) times daily as needed.  30 tablet  1  . fish oil-omega-3 fatty acids 1000 MG capsule Take 2 g by mouth daily.        . hydrocortisone 0.5 % ointment Apply to rectal area 3 times daily as needed for hemorrhoids.  30 g  1  . indapamide (LOZOL) 1.25 MG tablet Take 0.5 tablets (0.625 mg total) by mouth 2 (two) times daily.  90 tablet  3  . losartan (COZAAR) 50 MG tablet Take 1 tablet (50 mg total) by mouth 2 (two) times daily.  60 tablet  3  . Multiple Vitamin (MULTIVITAMIN) capsule Take 1 capsule by mouth daily.        . nadolol (CORGARD) 20 MG tablet Take 0.5 tablets (10 mg  total) by mouth 2 (two) times daily. 1/2 tab twice daily  90 tablet  3  . Nasal Moisturizer Combination SOLN 1 puff by Nasal route 2 (two) times daily.        . Red Yeast Rice 600 MG CAPS Take 1 capsule by mouth 2 (two) times daily.       [name] Kitchen HYDROcodone-acetaminophen (VICODIN) 5-500 MG per tablet Take 1 tablet by mouth every 6 (six) hours as needed for pain.  30 tablet  0   Current Facility-Administered Medications on File Prior to Visit  Medication Dose Route Frequency Provider Last Rate Last Dose  . cyanocobalamin ((VITAMIN B-12)) injection 1,000 mcg  1,000 mcg Intramuscular Q30 days [name], MD   1,500 mcg at 12/16/11 1229  . cyanocobalamin ((VITAMIN B-12)) injection 1,500 mcg  1,500 mcg Intramuscular Once [name], MD      . cyanocobalamin ((VITAMIN B-12)) injection 1,500 mcg  1,500 mcg Intramuscular Q30 days [name], MD   1,500 mcg at 11/19/11 1215    BP 160/90  Temp(Src) 97.8 F (36.6 C) (Oral)  Ht 6' (1.829 m)  Wt 202 lb (91.627 kg)  BMI 27.40 kg/m2chart     Objective:   Physical Exam  Constitutional: He is oriented to person, place, and time. He appears well-developed and well-nourished. No distress.  Cardiovascular: Normal rate, regular rhythm, normal heart sounds and intact distal pulses.  Exam reveals no gallop and no friction rub.   No murmur heard. Pulmonary/Chest: Effort normal and breath sounds normal. No respiratory distress. He has no wheezes. He has no rales. He exhibits no tenderness.  Abdominal: Soft. Bowel sounds are normal. He exhibits no distension and no mass. There is no tenderness. There is no rebound and no guarding.  Genitourinary: Rectum normal and penis normal. No penile tenderness.       Prostate enlarged on physical exam  Neurological: He is alert and oriented to person, place, and time.  Skin: Skin is warm and dry. He is not diaphoretic.          Assessment & Plan:  Assessment: BPH, b12 deficiency, hypertension,  Proteinuria  Plan: LAB: Psa, teaching handouts provided, rapid flow, encouraged to RTC if s/s get worse, vitamin b12 injection, continue to check bp at home, encouraged to exercise and eat heart healthy diet.

## 2012-02-16 ENCOUNTER — Ambulatory Visit: Payer: Medicare Other | Admitting: Internal Medicine

## 2012-03-03 ENCOUNTER — Ambulatory Visit: Payer: Medicare Other | Admitting: Internal Medicine

## 2012-03-08 ENCOUNTER — Ambulatory Visit (INDEPENDENT_AMBULATORY_CARE_PROVIDER_SITE_OTHER): Payer: Medicare Other | Admitting: Internal Medicine

## 2012-03-08 DIAGNOSIS — D518 Other vitamin B12 deficiency anemias: Secondary | ICD-10-CM

## 2012-03-08 DIAGNOSIS — D519 Vitamin B12 deficiency anemia, unspecified: Secondary | ICD-10-CM

## 2012-03-08 MED ORDER — CYANOCOBALAMIN 1000 MCG/ML IJ SOLN
1000.0000 ug | INTRAMUSCULAR | Status: DC
  Administered 2012-03-08: 1500 ug via INTRAMUSCULAR

## 2012-03-22 ENCOUNTER — Encounter: Payer: Self-pay | Admitting: Internal Medicine

## 2012-03-22 ENCOUNTER — Ambulatory Visit (INDEPENDENT_AMBULATORY_CARE_PROVIDER_SITE_OTHER): Payer: Medicare Other | Admitting: Internal Medicine

## 2012-03-22 VITALS — BP 140/90 | HR 56 | Temp 97.8°F | Resp 16 | Ht 72.0 in | Wt 200.0 lb

## 2012-03-22 DIAGNOSIS — E538 Deficiency of other specified B group vitamins: Secondary | ICD-10-CM

## 2012-03-22 DIAGNOSIS — I10 Essential (primary) hypertension: Secondary | ICD-10-CM

## 2012-03-22 DIAGNOSIS — Z0000 Encounter for general adult medical examination without abnormal findings: Secondary | ICD-10-CM

## 2012-03-22 DIAGNOSIS — G629 Polyneuropathy, unspecified: Secondary | ICD-10-CM

## 2012-03-22 DIAGNOSIS — G589 Mononeuropathy, unspecified: Secondary | ICD-10-CM

## 2012-03-22 DIAGNOSIS — G63 Polyneuropathy in diseases classified elsewhere: Secondary | ICD-10-CM

## 2012-03-22 LAB — CBC WITH DIFFERENTIAL/PLATELET
Basophils Absolute: 0 10*3/uL (ref 0.0–0.1)
Basophils Relative: 0.4 % (ref 0.0–3.0)
Eosinophils Absolute: 0.2 10*3/uL (ref 0.0–0.7)
Eosinophils Relative: 3.3 % (ref 0.0–5.0)
HCT: 40.1 % (ref 39.0–52.0)
Hemoglobin: 13.4 g/dL (ref 13.0–17.0)
Lymphocytes Relative: 38.5 % (ref 12.0–46.0)
Lymphs Abs: 2 10*3/uL (ref 0.7–4.0)
MCHC: 33.5 g/dL (ref 30.0–36.0)
MCV: 87.8 fl (ref 78.0–100.0)
Monocytes Absolute: 0.5 10*3/uL (ref 0.1–1.0)
Monocytes Relative: 10 % (ref 3.0–12.0)
Neutro Abs: 2.5 10*3/uL (ref 1.4–7.7)
Neutrophils Relative %: 47.8 % (ref 43.0–77.0)
Platelets: 159 10*3/uL (ref 150.0–400.0)
RBC: 4.57 Mil/uL (ref 4.22–5.81)
RDW: 13.8 % (ref 11.5–14.6)
WBC: 5.1 10*3/uL (ref 4.5–10.5)

## 2012-03-22 LAB — POCT URINALYSIS DIPSTICK
Bilirubin, UA: NEGATIVE
Blood, UA: NEGATIVE
Glucose, UA: NEGATIVE
Ketones, UA: NEGATIVE
Leukocytes, UA: NEGATIVE
Nitrite, UA: NEGATIVE
Spec Grav, UA: 1.025
Urobilinogen, UA: 0.2
pH, UA: 5.5

## 2012-03-22 LAB — LIPID PANEL
Cholesterol: 192 mg/dL (ref 0–200)
HDL: 38.2 mg/dL — ABNORMAL LOW (ref >39.00)
LDL Cholesterol: 125 mg/dL — ABNORMAL HIGH (ref 0–99)
Total CHOL/HDL Ratio: 5
Triglycerides: 144 mg/dL (ref 0.0–149.0)
VLDL: 28.8 mg/dL (ref 0.0–40.0)

## 2012-03-22 LAB — BASIC METABOLIC PANEL
BUN: 22 mg/dL (ref 6–23)
CO2: 25 mEq/L (ref 19–32)
Calcium: 9.4 mg/dL (ref 8.4–10.5)
Chloride: 110 mEq/L (ref 96–112)
Creatinine, Ser: 0.9 mg/dL (ref 0.4–1.5)
GFR: 92.46 mL/min (ref >60.00)
Glucose, Bld: 100 mg/dL — ABNORMAL HIGH (ref 70–99)
Potassium: 4.4 mEq/L (ref 3.5–5.1)
Sodium: 148 mEq/L — ABNORMAL HIGH (ref 135–145)

## 2012-03-22 LAB — HEPATIC FUNCTION PANEL
ALT: 16 U/L (ref 0–53)
AST: 22 U/L (ref 0–37)
Albumin: 3.7 g/dL (ref 3.5–5.2)
Alkaline Phosphatase: 60 U/L (ref 39–117)
Bilirubin, Direct: 0.1 mg/dL (ref 0.0–0.3)
Total Bilirubin: 0.9 mg/dL (ref 0.3–1.2)
Total Protein: 7.7 g/dL (ref 6.0–8.3)

## 2012-03-22 LAB — TSH: TSH: 2.65 u[IU]/mL (ref 0.35–5.50)

## 2012-03-22 NOTE — Progress Notes (Signed)
Subjective:    Patient ID: Timothy Gallagher, male    DOB: [birth year], 76 y.o.   MRN: [id number]  HPI  Since for complete physical examination.  He has a history of B12 deficiency and peripheral neuropathy he has been evaluated by Dr. love and neurologist for this problem.  Has a history of renal insufficiency and was diagnosed with a nephropathy.  This has largely resolved and he appears to be in remission.  Otherwise he has fasted today for screening blood work for lipids liver blood pressure a basic metabolic panel CBC with differential a B12 level for his B12 deficiency.  The PSA was drawn recently and is within range he is followed by urology as well as nephrology  Review of Systems  Constitutional: Negative for fever and fatigue.  HENT: Negative for hearing loss, congestion, neck pain and postnasal drip.   Eyes: Negative for discharge, redness and visual disturbance.  Respiratory: Negative for cough, shortness of breath and wheezing.   Cardiovascular: Negative for leg swelling.  Gastrointestinal: Negative for abdominal pain, constipation and abdominal distention.  Genitourinary: Negative for urgency and frequency.  Musculoskeletal: Negative for joint swelling and arthralgias.  Skin: Negative for color change and rash.  Neurological: Negative for weakness and light-headedness.  Hematological: Negative for adenopathy.  Psychiatric/Behavioral: Negative for behavioral problems.   Past Medical History  Diagnosis Date  . ACNE ROSACEA 06/27/2009  . ACTINIC KERATOSIS, HEAD 04/18/2009  . Acute maxillary sinusitis 05/14/2010  . ALLERGIC RHINITIS 04/09/2007  . B12 DEFICIENCY 06/07/2007  . BACK PAIN WITH RADICULOPATHY 04/24/2008  . CHEST WALL PAIN, ACUTE 06/15/2009  . Chronic maxillary sinusitis 05/29/2008  . COLITIS 04/27/2009  . COLONIC POLYPS, HX OF 04/27/2009    tubular adenomas  . DERMATITIS, ATOPIC 10/12/2007  . DIVERTICULOSIS, COLON 04/27/2009  . ECCHYMOSES, SPONTANEOUS 06/27/2008  .  Elevated sedimentation rate 05/02/2009  . ESOPHAGEAL STRICTURE 04/27/2009  . GASTRITIS, CHRONIC 04/27/2009  . HYPERLIPIDEMIA 03/13/2008  . HYPERTENSION 04/09/2007  . Irritable bowel syndrome 08/11/2007  . KIDNEY DISEASE 04/09/2007  . NECK PAIN 09/14/2008  . NEUROPATHY, IDIOPATHIC PERIPHERAL NEC 08/11/2007  . OSTEOARTHRITIS, WRIST, RIGHT 08/05/2010    History   Social History  . Marital Status: Married    Spouse Name: N/A    Number of Children: 4  . Years of Education: N/A   Occupational History  . retired     from [employer]   Social History Main Topics  . Smoking status: Former Smoker    Quit date: 11/17/2001  . Smokeless tobacco: Never Used  . Alcohol Use: No  . Drug Use: No  . Sexually Active: Yes   Other Topics Concern  . Not on file   Social History Narrative  . No narrative on file    Past Surgical History  Procedure Date  . Hernia repair   . Lamenectomy   . Lumbar fusion   . Tonsillectomy     Family History  Problem Relation Age of Onset  . Hernia Mother   . Heart disease Father   . COPD Father   . Colon cancer Neg Hx     Allergies  Allergen Reactions  . Amoxicillin     REACTION: unspecified  . Atorvastatin     REACTION: nausea and blurred vision  . Ciprofloxacin     REACTION: unspecified  . Mycophenolate Mofetil     REACTION: unspecified  . Prednisone     REACTION: unspecified  . Rosuvastatin     Current Outpatient Prescriptions on File  Prior to Visit  Medication Sig Dispense Refill  . acetaminophen (TYLENOL) 500 MG tablet Take 500 mg by mouth every 6 (six) hours as needed.        . cyanocobalamin 1000 MCG/ML injection Inject into the muscle. 1.75ml every 3 weeks       . diphenoxylate-atropine (LOMOTIL) 2.5-0.025 MG per tablet Take 1 tablet by mouth 4 (four) times daily as needed.  30 tablet  1  . fish oil-omega-3 fatty acids 1000 MG capsule Take 2 g by mouth daily.        . hydrocortisone 0.5 % ointment Apply to rectal area 3 times daily as  needed for hemorrhoids.  30 g  1  . indapamide (LOZOL) 1.25 MG tablet Take 0.5 tablets (0.625 mg total) by mouth 2 (two) times daily.  90 tablet  3  . losartan (COZAAR) 50 MG tablet Take 1 tablet (50 mg total) by mouth 2 (two) times daily.  60 tablet  3  . Multiple Vitamin (MULTIVITAMIN) capsule Take 1 capsule by mouth daily.        . nadolol (CORGARD) 20 MG tablet Take 0.5 tablets (10 mg total) by mouth 2 (two) times daily. 1/2 tab twice daily  90 tablet  3  . Nasal Moisturizer Combination SOLN 1 puff by Nasal route 2 (two) times daily.        . Red Yeast Rice 600 MG CAPS Take 1 capsule by mouth 2 (two) times daily.        Current Facility-Administered Medications on File Prior to Visit  Medication Dose Route Frequency Provider Last Rate Last Dose  . cyanocobalamin ((VITAMIN B-12)) injection 1,000 mcg  1,000 mcg Intramuscular Q30 days [name], MD   1,500 mcg at 12/16/11 1229  . cyanocobalamin ((VITAMIN B-12)) injection 1,000 mcg  1,000 mcg Intramuscular Q30 days [name], MD   1,500 mcg at 03/08/12 1653  . cyanocobalamin ((VITAMIN B-12)) injection 1,500 mcg  1,500 mcg Intramuscular Once [name], MD      . cyanocobalamin ((VITAMIN B-12)) injection 1,500 mcg  1,500 mcg Intramuscular Q30 days [name], MD   1,500 mcg at 11/19/11 1215    BP 140/90  Pulse 56  Temp 97.8 F (36.6 C)  Resp 16  Ht 6' (1.829 m)  Wt 200 lb (90.719 kg)  BMI 27.12 kg/m2   .    Objective:   Physical Exam  Nursing note and vitals reviewed. Constitutional: He is oriented to person, place, and time. He appears well-developed and well-nourished.  HENT:  Head: Normocephalic and atraumatic.  Eyes: Conjunctivae are normal. Pupils are equal, round, and reactive to light.  Neck: Normal range of motion. Neck supple.  Cardiovascular: Normal rate and regular rhythm.   Pulmonary/Chest: Effort normal and breath sounds normal.  Abdominal: Soft. Bowel sounds are normal.  Genitourinary: Rectum  normal and prostate normal.  Musculoskeletal: Normal range of motion.  Neurological: He is alert and oriented to person, place, and time.  Skin: Skin is warm and dry.  Psychiatric: He has a normal mood and affect. His behavior is normal.          Assessment & Plan:   Patient presents for yearly preventative medicine examination.   all immunizations and health maintenance protocols were reviewed with the patient and they are up to date with these protocols.   screening laboratory values were reviewed with the patient including screening of hyperlipidemia PSA renal function and hepatic function.   There medications past  medical history social history problem list and allergies were reviewed in detail.   Goals were established with regard to weight loss exercise diet in compliance with medications   We will measure a B12 level today for his peripheral neuropathy and adjust his dose as appropriate.  He has persistent cold extremities with signs of neuropathy but no worse in symptomology.. Patient has history of nephropathy currently in remission Pt has GERD that is stable with a history of esophageal reflux and no current signs of dysphasia Patient has chronic allergic rhinitis and chronic maxillary sinusitis is stable

## 2012-03-22 NOTE — Patient Instructions (Signed)
The patient is instructed to continue all medications as prescribed. Schedule followup with check out clerk upon leaving the clinic  

## 2012-03-25 LAB — METHYLMALONIC ACID, SERUM: Methylmalonic Acid, Quant: 0.13 umol/L (ref <0.40)

## 2012-03-29 ENCOUNTER — Ambulatory Visit (INDEPENDENT_AMBULATORY_CARE_PROVIDER_SITE_OTHER): Payer: Medicare Other | Admitting: Internal Medicine

## 2012-03-29 DIAGNOSIS — D519 Vitamin B12 deficiency anemia, unspecified: Secondary | ICD-10-CM

## 2012-03-29 DIAGNOSIS — D518 Other vitamin B12 deficiency anemias: Secondary | ICD-10-CM

## 2012-03-29 MED ORDER — CYANOCOBALAMIN 1000 MCG/ML IJ SOLN
1000.0000 ug | INTRAMUSCULAR | Status: AC
  Administered 2012-03-29: 1000 ug via INTRAMUSCULAR

## 2012-04-19 ENCOUNTER — Ambulatory Visit (INDEPENDENT_AMBULATORY_CARE_PROVIDER_SITE_OTHER): Payer: Medicare Other | Admitting: Internal Medicine

## 2012-04-19 DIAGNOSIS — E538 Deficiency of other specified B group vitamins: Secondary | ICD-10-CM

## 2012-04-19 MED ORDER — CYANOCOBALAMIN 1000 MCG/ML IJ SOLN
1000.0000 ug | INTRAMUSCULAR | Status: AC
  Administered 2012-04-19: 1000 ug via INTRAMUSCULAR

## 2012-04-26 ENCOUNTER — Encounter: Payer: Self-pay | Admitting: Internal Medicine

## 2012-04-26 ENCOUNTER — Ambulatory Visit (INDEPENDENT_AMBULATORY_CARE_PROVIDER_SITE_OTHER): Payer: Medicare Other | Admitting: Internal Medicine

## 2012-04-26 VITALS — BP 160/100 | HR 68 | Temp 97.8°F | Resp 16 | Ht 72.0 in | Wt 200.0 lb

## 2012-04-26 DIAGNOSIS — IMO0002 Reserved for concepts with insufficient information to code with codable children: Secondary | ICD-10-CM

## 2012-04-26 DIAGNOSIS — N189 Chronic kidney disease, unspecified: Secondary | ICD-10-CM

## 2012-04-26 DIAGNOSIS — I9589 Other hypotension: Secondary | ICD-10-CM

## 2012-04-26 DIAGNOSIS — I10 Essential (primary) hypertension: Secondary | ICD-10-CM

## 2012-04-26 DIAGNOSIS — M792 Neuralgia and neuritis, unspecified: Secondary | ICD-10-CM

## 2012-04-26 MED ORDER — INDAPAMIDE 1.25 MG PO TABS: 0.6250 mg | ORAL_TABLET | Freq: Every day | ORAL | Status: DC

## 2012-04-26 MED ORDER — TRAMADOL HCL 50 MG PO TABS: 50.0000 mg | ORAL_TABLET | Freq: Three times a day (TID) | ORAL | Status: AC | PRN

## 2012-04-26 NOTE — Progress Notes (Signed)
Subjective:    Patient ID: Timothy Gallagher, male    DOB: 1936-03-21, 76 y.o.   MRN: [id number]  HPI Patient is 76 year old male who had some hypotension and adjust his blood pressure medicines.  He decided to stop the evening dose of all of his blood pressure medicines and he presents today feeling better but having elevated blood pressure.  His symptoms generated the need to adjust his blood pressure medicines with a feeling of weakness hypotension and shortness of breath.  This is resolved but now his blood pressure is moderately elevated at 160/100 his pulse is 68 suggesting that he is having a rebound blood pressure elevation from stopping the Corgard.     Review of Systems  Constitutional: Negative for fever and fatigue.  HENT: Negative for hearing loss, congestion, neck pain and postnasal drip.   Eyes: Negative for discharge, redness and visual disturbance.  Respiratory: Negative for cough, shortness of breath and wheezing.   Cardiovascular: Negative for leg swelling.  Gastrointestinal: Negative for abdominal pain, constipation and abdominal distention.  Genitourinary: Negative for urgency and frequency.  Musculoskeletal: Negative for joint swelling and arthralgias.  Skin: Negative for color change and rash.  Neurological: Negative for weakness and light-headedness.  Hematological: Negative for adenopathy.  Psychiatric/Behavioral: Negative for behavioral problems.   Past Medical History  Diagnosis Date  . ACNE ROSACEA 06/27/2009  . ACTINIC KERATOSIS, HEAD 04/18/2009  . Acute maxillary sinusitis 05/14/2010  . ALLERGIC RHINITIS 04/09/2007  . B12 DEFICIENCY 06/07/2007  . BACK PAIN WITH RADICULOPATHY 04/24/2008  . CHEST WALL PAIN, ACUTE 06/15/2009  . Chronic maxillary sinusitis 05/29/2008  . COLITIS 04/27/2009  . COLONIC POLYPS, HX OF 04/27/2009    tubular adenomas  . DERMATITIS, ATOPIC 10/12/2007  . DIVERTICULOSIS, COLON 04/27/2009  . ECCHYMOSES, SPONTANEOUS 06/27/2008  . Elevated  sedimentation rate 05/02/2009  . ESOPHAGEAL STRICTURE 04/27/2009  . GASTRITIS, CHRONIC 04/27/2009  . HYPERLIPIDEMIA 03/13/2008  . HYPERTENSION 04/09/2007  . Irritable bowel syndrome 08/11/2007  . KIDNEY DISEASE 04/09/2007  . NECK PAIN 09/14/2008  . NEUROPATHY, IDIOPATHIC PERIPHERAL NEC 08/11/2007  . OSTEOARTHRITIS, WRIST, RIGHT 08/05/2010    History   Social History  . Marital Status: Married    Spouse Name: N/A    Number of Children: 4  . Years of Education: N/A   Occupational History  . retired     from [employer]   Social History Main Topics  . Smoking status: Former Smoker    Quit date: 11/17/2001  . Smokeless tobacco: Never Used  . Alcohol Use: No  . Drug Use: No  . Sexually Active: Yes   Other Topics Concern  . Not on file   Social History Narrative  . No narrative on file    Past Surgical History  Procedure Date  . Hernia repair   . Lamenectomy   . Lumbar fusion   . Tonsillectomy     Family History  Problem Relation Age of Onset  . Hernia Mother   . Heart disease Father   . COPD Father   . Colon cancer Neg Hx     Allergies  Allergen Reactions  . Amoxicillin     REACTION: unspecified  . Atorvastatin     REACTION: nausea and blurred vision  . Ciprofloxacin     REACTION: unspecified  . Mycophenolate Mofetil     REACTION: unspecified  . Prednisone     REACTION: unspecified  . Rosuvastatin     Current Outpatient Prescriptions on File Prior to Visit  Medication  Sig Dispense Refill  . acetaminophen (TYLENOL) 500 MG tablet Take 500 mg by mouth every 6 (six) hours as needed.        . cyanocobalamin 1000 MCG/ML injection Inject into the muscle. 1.53ml every 3 weeks       . diphenoxylate-atropine (LOMOTIL) 2.5-0.025 MG per tablet Take 1 tablet by mouth 4 (four) times daily as needed.  30 tablet  1  . fish oil-omega-3 fatty acids 1000 MG capsule Take 2 g by mouth daily.        . hydrocortisone 0.5 % ointment Apply to rectal area 3 times daily as needed for  hemorrhoids.  30 g  1  . indapamide (LOZOL) 1.25 MG tablet Take 0.5 tablets (0.625 mg total) by mouth 2 (two) times daily.  90 tablet  3  . losartan (COZAAR) 50 MG tablet Take 1 tablet (50 mg total) by mouth 2 (two) times daily.  60 tablet  3  . Multiple Vitamin (MULTIVITAMIN) capsule Take 1 capsule by mouth daily.        . nadolol (CORGARD) 20 MG tablet Take 0.5 tablets (10 mg total) by mouth 2 (two) times daily. 1/2 tab twice daily  90 tablet  3  . Nasal Moisturizer Combination SOLN 1 puff by Nasal route 2 (two) times daily.        . Red Yeast Rice 600 MG CAPS Take 1 capsule by mouth 2 (two) times daily.        Current Facility-Administered Medications on File Prior to Visit  Medication Dose Route Frequency Provider Last Rate Last Dose  . DISCONTD: cyanocobalamin ((VITAMIN B-12)) injection 1,000 mcg  1,000 mcg Intramuscular Q30 days [name], MD   1,500 mcg at 12/16/11 1229  . DISCONTD: cyanocobalamin ((VITAMIN B-12)) injection 1,000 mcg  1,000 mcg Intramuscular Q30 days [name], MD   1,500 mcg at 03/08/12 1653  . DISCONTD: cyanocobalamin ((VITAMIN B-12)) injection 1,500 mcg  1,500 mcg Intramuscular Once [name], MD      . DISCONTD: cyanocobalamin ((VITAMIN B-12)) injection 1,500 mcg  1,500 mcg Intramuscular Q30 days [name], MD   1,500 mcg at 11/19/11 1215    BP 160/100  Pulse 68  Temp 97.8 F (36.6 C)  Resp 16  Ht 6' (1.829 m)  Wt 200 lb (90.719 kg)  BMI 27.12 kg/m2       Objective:   Physical Exam  Nursing note and vitals reviewed. Constitutional: He appears well-developed and well-nourished.  HENT:  Head: Normocephalic and atraumatic.  Eyes: Conjunctivae are normal. Pupils are equal, round, and reactive to light.  Neck: Normal range of motion. Neck supple.  Cardiovascular: Normal rate and regular rhythm.   Pulmonary/Chest: Effort normal and breath sounds normal.  Abdominal: Soft. Bowel sounds are normal.          Assessment & Plan:  The  patient probably had hypotension secondary dairy to dehydration.  This is demonstrated by the fact that he stopped the medications and his blood pressure elevated and he feels better.  She chief her blood pressure around 0140 systolic with him to do so we will make sure that he only takes the diuretic in the morning one half tablet and continues with the Cozaar and the nadolol.  If his blood pressure drops I would recommend cutting back the Cozaar to 50 mg once a day but continuing the nadolol twice a day and continuing the diuretic once a day

## 2012-04-26 NOTE — Patient Instructions (Addendum)
Take the Lozol  only in the AM  1/2 in the AM Take the Cozaar twice a day and take the Corgard twice a day.  If her blood pressure seems to drop but remain on the Corgard.

## 2012-04-28 ENCOUNTER — Telehealth: Payer: Self-pay | Admitting: Internal Medicine

## 2012-04-28 NOTE — Telephone Encounter (Signed)
Per dr [name]- take cozaar once a day-pt informed

## 2012-04-28 NOTE — Telephone Encounter (Signed)
Caller: [place]/Patient; PCP: Darryll Capers; CB#: [phone]; ; ; Call regarding Low BP;  states he takes his medication only in the AM, but states he is not sure what Dr. [name] meant.  On arising AM 04/28/12 he took his BP medication, and by 0930 04/28/12 it was 109/64 but then recovered at 1230 135/70.  States Dr. [name] told him if his BP drops, reduce the cozaar to once daily, keep the corgard twice a day, and continue the diuretic once a day.  States will take cozaar only at night now to reduce his weakness.  Is taking "plenty of fluids" according to patient.   Per protocol, emergent symptoms denied; info to office for provider review/callback. MAY REACH PATIENT AT [phone].

## 2012-05-10 ENCOUNTER — Ambulatory Visit (INDEPENDENT_AMBULATORY_CARE_PROVIDER_SITE_OTHER): Payer: Medicare Other | Admitting: Internal Medicine

## 2012-05-10 DIAGNOSIS — E538 Deficiency of other specified B group vitamins: Secondary | ICD-10-CM

## 2012-05-10 MED ORDER — CYANOCOBALAMIN 1000 MCG/ML IJ SOLN
1000.0000 ug | Freq: Once | INTRAMUSCULAR | Status: AC
  Administered 2012-05-10: 1000 ug via INTRAMUSCULAR

## 2012-05-31 ENCOUNTER — Ambulatory Visit (INDEPENDENT_AMBULATORY_CARE_PROVIDER_SITE_OTHER): Payer: Medicare Other | Admitting: Internal Medicine

## 2012-05-31 DIAGNOSIS — D518 Other vitamin B12 deficiency anemias: Secondary | ICD-10-CM

## 2012-05-31 DIAGNOSIS — D519 Vitamin B12 deficiency anemia, unspecified: Secondary | ICD-10-CM

## 2012-05-31 MED ORDER — CYANOCOBALAMIN 1000 MCG/ML IJ SOLN
1000.0000 ug | INTRAMUSCULAR | Status: AC
  Administered 2012-05-31: 1500 ug via INTRAMUSCULAR

## 2012-06-02 ENCOUNTER — Telehealth: Payer: Self-pay | Admitting: Internal Medicine

## 2012-06-02 NOTE — Telephone Encounter (Signed)
Pt calling to check status of form he dropped off

## 2012-06-02 NOTE — Telephone Encounter (Signed)
Pt informed ready for pick up 

## 2012-06-02 NOTE — Telephone Encounter (Signed)
Caller: Timothy Gallagher/Patient; PCP: Darryll Capers; CB#: [phone]; ; ; Call regarding Diarrhea, Nausea, Chills;   Patient states he developed weakness, nausea, chills, diarrhea. Onset 06/01/12. States feels like he has a low grade fever 06/02/12. Patient does not have a thermometer. Patient taking fluids. Urinating normally for patient. Denies abdominal pain. States loose BM X 2-3 07/176/13. States no diarrhea 06/02/12. States has formed BM 06/02/12 a.m. that was "normal" for patient. States nausea has improved 06/02/12. Triage per Diarrhea Protocol. No emergent sx identified. Care advice and diet advice given per guidelines. Patient declines appt. for 06/02/12. Call back parameters reviewed. Patient verbalizes understanding.

## 2012-06-02 NOTE — Telephone Encounter (Signed)
FYI

## 2012-06-02 NOTE — Telephone Encounter (Signed)
Dr jenkins agrees 

## 2012-06-03 ENCOUNTER — Ambulatory Visit (INDEPENDENT_AMBULATORY_CARE_PROVIDER_SITE_OTHER): Payer: Medicare Other | Admitting: Internal Medicine

## 2012-06-03 ENCOUNTER — Encounter: Payer: Self-pay | Admitting: Internal Medicine

## 2012-06-03 VITALS — BP 132/82 | Temp 98.3°F | Wt 199.0 lb

## 2012-06-03 DIAGNOSIS — I10 Essential (primary) hypertension: Secondary | ICD-10-CM

## 2012-06-03 DIAGNOSIS — R197 Diarrhea, unspecified: Secondary | ICD-10-CM

## 2012-06-03 NOTE — Progress Notes (Signed)
  Subjective:    Patient ID: Timothy Gallagher, male    DOB: December 11, 1935, 75 y.o.   MRN: [id number]  HPI  76 year old patient who presents with a two-day history of fever chills and diarrhea. No abdominal pain nausea or vomiting. His appetite is well maintained. He has a history of treated hypertension which has been stable. He slept for the night without diarrhea and today feels improved. This morning he is afebrile.    Review of Systems  Constitutional: Positive for fever and chills.  Gastrointestinal: Positive for diarrhea. Negative for nausea, vomiting, abdominal pain, blood in stool and abdominal distention.       Objective:   Physical Exam  Constitutional: He is oriented to person, place, and time. He appears well-developed.  HENT:  Head: Normocephalic.  Right Ear: External ear normal.  Left Ear: External ear normal.  Eyes: Conjunctivae and EOM are normal.  Neck: Normal range of motion.  Cardiovascular: Normal rate and normal heart sounds.   Pulmonary/Chest: Breath sounds normal.  Abdominal: Soft. Bowel sounds are normal. He exhibits no distension. There is no tenderness. There is no rebound and no guarding.  Musculoskeletal: Normal range of motion. He exhibits no edema and no tenderness.  Neurological: He is alert and oriented to person, place, and time.  Psychiatric: He has a normal mood and affect. His behavior is normal.          Assessment & Plan:   Diarrhea. Probable resolving viral gastroenteritis.  He is afebrile and in general is improved. We'll take, when necessary. He may continue his antidiarrheal if needed. Was given samples of Align to take 1 daily

## 2012-06-03 NOTE — Patient Instructions (Signed)
Drink  plenty of  fluids, and advance your diet  slowly to solids as you feel improved.  If you are unable to keep anything down or  Show  signs of dehydration such as dry, cracked lips, or  not urinating, please notify our office.  ALIGN  One daily

## 2012-06-08 ENCOUNTER — Telehealth: Payer: Self-pay | Admitting: Internal Medicine

## 2012-06-08 NOTE — Telephone Encounter (Signed)
Patient is aware.  He has seen Dr Jarold Motto in the past and will give his office a call.

## 2012-06-08 NOTE — Telephone Encounter (Signed)
Per dr [name]- has already seen dr Kirtland Bouchard- per dr Robbie Inioluwa continuing to have trouble,referral to gi and may see pa there

## 2012-06-08 NOTE — Telephone Encounter (Signed)
Caller: Timothy Gallagher/Patient; PCP: Darryll Capers; CB#: [phone]; ; ; Call regarding Nausea;   onset 06-01-12 of nausea, loss of appetite, has vomited x 2 or 3.  States feels weak, unsure if fever but has been having chills and then will sweat profusely.  Is drinking.  All emergent sxs per Nausea or Vomiting protocols R/O  except for loss of appetite for 3 or more days or nausea for 7 or more days   No appt available today or tomorrow  He does have appt with his urologist in AM  If Dr [name] has any cancellations today or could see Timothy Gallagher please notify patient  thanks

## 2012-06-10 ENCOUNTER — Ambulatory Visit: Payer: Medicare Other | Admitting: Gastroenterology

## 2012-06-22 ENCOUNTER — Ambulatory Visit: Payer: Medicare Other | Admitting: Internal Medicine

## 2012-06-24 ENCOUNTER — Ambulatory Visit (INDEPENDENT_AMBULATORY_CARE_PROVIDER_SITE_OTHER): Payer: Medicare Other | Admitting: Internal Medicine

## 2012-06-24 ENCOUNTER — Encounter: Payer: Self-pay | Admitting: Internal Medicine

## 2012-06-24 ENCOUNTER — Other Ambulatory Visit: Payer: Self-pay | Admitting: *Deleted

## 2012-06-24 VITALS — BP 140/84 | HR 72 | Temp 98.2°F | Resp 16 | Ht 73.0 in | Wt 195.0 lb

## 2012-06-24 DIAGNOSIS — E785 Hyperlipidemia, unspecified: Secondary | ICD-10-CM

## 2012-06-24 DIAGNOSIS — IMO0002 Reserved for concepts with insufficient information to code with codable children: Secondary | ICD-10-CM

## 2012-06-24 DIAGNOSIS — G608 Other hereditary and idiopathic neuropathies: Secondary | ICD-10-CM

## 2012-06-24 DIAGNOSIS — K589 Irritable bowel syndrome without diarrhea: Secondary | ICD-10-CM

## 2012-06-24 DIAGNOSIS — I10 Essential (primary) hypertension: Secondary | ICD-10-CM

## 2012-06-24 DIAGNOSIS — E538 Deficiency of other specified B group vitamins: Secondary | ICD-10-CM

## 2012-06-24 MED ORDER — LOSARTAN POTASSIUM 50 MG PO TABS: 50.0000 mg | ORAL_TABLET | Freq: Two times a day (BID) | ORAL | Status: DC

## 2012-06-24 MED ORDER — DIPHENOXYLATE-ATROPINE 2.5-0.025 MG PO TABS: 1.0000 | ORAL_TABLET | Freq: Four times a day (QID) | ORAL | Status: DC | PRN

## 2012-06-24 MED ORDER — INDAPAMIDE 1.25 MG PO TABS: 0.6250 mg | ORAL_TABLET | Freq: Every day | ORAL | Status: DC

## 2012-06-24 MED ORDER — CYANOCOBALAMIN 1000 MCG/ML IJ SOLN
1000.0000 ug | INTRAMUSCULAR | Status: DC
  Administered 2012-06-24: 1500 ug via INTRAMUSCULAR

## 2012-06-24 NOTE — Progress Notes (Signed)
Subjective:    Patient ID: Timothy Gallagher, male    DOB: [birth year], 76 y.o.   MRN: [id number]  HPI  Pt  HAD A "VIRAL ILNESS"  VAGUE PERSISTANT SYMPTOMS discussd his symptoms and the visits he had with providers Stable now Blood pressure stable Discussed gluten free diet  Review of Systems  Constitutional: Negative for fever and fatigue.  HENT: Negative for hearing loss, congestion, neck pain and postnasal drip.   Eyes: Negative for discharge, redness and visual disturbance.  Respiratory: Negative for cough, shortness of breath and wheezing.   Cardiovascular: Negative for leg swelling.  Gastrointestinal: Negative for abdominal pain, constipation and abdominal distention.  Genitourinary: Negative for urgency and frequency.  Musculoskeletal: Negative for joint swelling and arthralgias.  Skin: Negative for color change and rash.  Neurological: Negative for weakness and light-headedness.  Hematological: Negative for adenopathy.  Psychiatric/Behavioral: Negative for behavioral problems.   Past Medical History  Diagnosis Date  . ACNE ROSACEA 06/27/2009  . ACTINIC KERATOSIS, HEAD 04/18/2009  . Acute maxillary sinusitis 05/14/2010  . ALLERGIC RHINITIS 04/09/2007  . B12 DEFICIENCY 06/07/2007  . BACK PAIN WITH RADICULOPATHY 04/24/2008  . CHEST WALL PAIN, ACUTE 06/15/2009  . Chronic maxillary sinusitis 05/29/2008  . COLITIS 04/27/2009  . COLONIC POLYPS, HX OF 04/27/2009    tubular adenomas  . DERMATITIS, ATOPIC 10/12/2007  . DIVERTICULOSIS, COLON 04/27/2009  . ECCHYMOSES, SPONTANEOUS 06/27/2008  . Elevated sedimentation rate 05/02/2009  . ESOPHAGEAL STRICTURE 04/27/2009  . GASTRITIS, CHRONIC 04/27/2009  . HYPERLIPIDEMIA 03/13/2008  . HYPERTENSION 04/09/2007  . Irritable bowel syndrome 08/11/2007  . KIDNEY DISEASE 04/09/2007  . NECK PAIN 09/14/2008  . NEUROPATHY, IDIOPATHIC PERIPHERAL NEC 08/11/2007  . OSTEOARTHRITIS, WRIST, RIGHT 08/05/2010    History   Social History  . Marital Status:  Married    Spouse Name: N/A    Number of Children: 4  . Years of Education: N/A   Occupational History  . retired     from [employer]   Social History Main Topics  . Smoking status: Former Smoker    Quit date: 11/17/2001  . Smokeless tobacco: Never Used  . Alcohol Use: No  . Drug Use: No  . Sexually Active: Yes   Other Topics Concern  . Not on file   Social History Narrative  . No narrative on file    Past Surgical History  Procedure Date  . Hernia repair   . Lamenectomy   . Lumbar fusion   . Tonsillectomy     Family History  Problem Relation Age of Onset  . Hernia Mother   . Heart disease Father   . COPD Father   . Colon cancer Neg Hx     Allergies  Allergen Reactions  . Amoxicillin     REACTION: unspecified  . Atorvastatin     REACTION: nausea and blurred vision  . Ciprofloxacin     REACTION: unspecified  . Mycophenolate Mofetil     REACTION: unspecified  . Prednisone     REACTION: unspecified  . Rosuvastatin     Current Outpatient Prescriptions on File Prior to Visit  Medication Sig Dispense Refill  . acetaminophen (TYLENOL) 500 MG tablet Take 500 mg by mouth every 6 (six) hours as needed.        . fish oil-omega-3 fatty acids 1000 MG capsule Take 2 g by mouth daily.        . hydrocortisone 0.5 % ointment Apply to rectal area 3 times daily as needed for hemorrhoids.  30  g  1  . Multiple Vitamin (MULTIVITAMIN) capsule Take 1 capsule by mouth daily.        . nadolol (CORGARD) 20 MG tablet Take 0.5 tablets (10 mg total) by mouth 2 (two) times daily. 1/2 tab twice daily  90 tablet  3  . Nasal Moisturizer Combination SOLN 1 puff by Nasal route 2 (two) times daily.        . Red Yeast Rice 600 MG CAPS Take 1 capsule by mouth 2 (two) times daily.       . traMADol (ULTRAM) 50 MG tablet Take 1 tablet by mouth daily.      [name] Kitchen DISCONTD: indapamide (LOZOL) 1.25 MG tablet Take 0.5 tablets (0.625 mg total) by mouth daily.  90 tablet  3  . DISCONTD: losartan (COZAAR)  50 MG tablet Take 1 tablet (50 mg total) by mouth 2 (two) times daily.  60 tablet  3   No current facility-administered medications on file prior to visit.    BP 140/84  Pulse 72  Temp 98.2 F (36.8 C)  Resp 16  Ht 6\' 1"  (1.854 m)  Wt 195 lb (88.451 kg)  BMI 25.73 kg/m2       Objective:   Physical Exam  Constitutional: He appears well-developed and well-nourished.  HENT:  Head: Normocephalic and atraumatic.  Eyes: Conjunctivae are normal. Pupils are equal, round, and reactive to light.  Neck: Normal range of motion. Neck supple.  Cardiovascular: Normal rate and regular rhythm.   Pulmonary/Chest: Effort normal and breath sounds normal.  Abdominal: Soft. Bowel sounds are normal.          Assessment & Plan:  Reviewed in depth gluten free "paleo" diet Gave samples of qnasal for allergies Samples of cialis reviewed recent labs stable IBS and HTN

## 2012-06-24 NOTE — Patient Instructions (Addendum)
The patient is instructed to continue all medications as prescribed. Schedule followup with check out clerk upon leaving the clinic  

## 2012-07-05 ENCOUNTER — Other Ambulatory Visit: Payer: Self-pay | Admitting: Internal Medicine

## 2012-07-15 ENCOUNTER — Ambulatory Visit (INDEPENDENT_AMBULATORY_CARE_PROVIDER_SITE_OTHER): Payer: Medicare Other | Admitting: Internal Medicine

## 2012-07-15 DIAGNOSIS — E538 Deficiency of other specified B group vitamins: Secondary | ICD-10-CM

## 2012-07-15 MED ORDER — CYANOCOBALAMIN 1000 MCG/ML IJ SOLN
1000.0000 ug | Freq: Once | INTRAMUSCULAR | Status: AC
  Administered 2012-07-15: 1000 ug via INTRAMUSCULAR

## 2012-08-05 ENCOUNTER — Ambulatory Visit (INDEPENDENT_AMBULATORY_CARE_PROVIDER_SITE_OTHER): Payer: Medicare Other | Admitting: Internal Medicine

## 2012-08-05 DIAGNOSIS — D518 Other vitamin B12 deficiency anemias: Secondary | ICD-10-CM

## 2012-08-05 DIAGNOSIS — D519 Vitamin B12 deficiency anemia, unspecified: Secondary | ICD-10-CM

## 2012-08-05 DIAGNOSIS — Z23 Encounter for immunization: Secondary | ICD-10-CM

## 2012-08-05 MED ORDER — CYANOCOBALAMIN 1000 MCG/ML IJ SOLN
1000.0000 ug | INTRAMUSCULAR | Status: AC
  Administered 2012-08-05: 1000 ug via INTRAMUSCULAR

## 2012-08-26 ENCOUNTER — Ambulatory Visit (INDEPENDENT_AMBULATORY_CARE_PROVIDER_SITE_OTHER): Payer: Medicare Other | Admitting: *Deleted

## 2012-08-26 DIAGNOSIS — E538 Deficiency of other specified B group vitamins: Secondary | ICD-10-CM

## 2012-08-26 MED ORDER — CYANOCOBALAMIN 1000 MCG/ML IJ SOLN
1000.0000 ug | Freq: Once | INTRAMUSCULAR | Status: AC
  Administered 2012-08-26: 1000 ug via INTRAMUSCULAR

## 2012-09-08 ENCOUNTER — Other Ambulatory Visit: Payer: Self-pay | Admitting: Internal Medicine

## 2012-09-08 DIAGNOSIS — I10 Essential (primary) hypertension: Secondary | ICD-10-CM

## 2012-09-08 MED ORDER — INDAPAMIDE 1.25 MG PO TABS: 0.6250 mg | ORAL_TABLET | Freq: Every day | ORAL | Status: DC

## 2012-09-08 MED ORDER — LOSARTAN POTASSIUM 50 MG PO TABS: 50.0000 mg | ORAL_TABLET | Freq: Two times a day (BID) | ORAL | Status: DC

## 2012-09-08 MED ORDER — NADOLOL 20 MG PO TABS: 10.0000 mg | ORAL_TABLET | Freq: Two times a day (BID) | ORAL | Status: DC

## 2012-09-08 NOTE — Telephone Encounter (Signed)
Caller: [name]/Patient; Patient Name: Timothy Gallagher; PCP: Darryll Capers (Adults only); [facility] Phone Number: [phone].  Pt. states [facility] will not fax a request for the following refills.  Please refill the following for the pt.:    Indapamide (Tab) LOZOL 1.25 MG Take 0.5 tablets (0.625 mg total) by mouth daily.  Losartan Potassium (Tab) COZAAR 50 MG Take 1 tablet (50 mg total) by mouth 2 (two) times daily.  Nadolol (Tab) CORGARD 20 MG Take 0.5 tablets (10 mg total) by mouth 2 (two) times daily. 1/2 tab twice daily   [facility]: Ph.   [phone].

## 2012-09-08 NOTE — Telephone Encounter (Signed)
sent 

## 2012-09-14 ENCOUNTER — Telehealth: Payer: Self-pay | Admitting: Internal Medicine

## 2012-09-14 NOTE — Telephone Encounter (Signed)
Caller: Timothy Gallagher/Patient; Patient Name: Timothy Gallagher; PCP: Darryll Capers (Adults only); [facility] Phone Number: [phone] Onset-09/13/12  Afebrile. Pt has runny nose, nasal congestion, and sneezing. Calling to request an antibiotic. Pt informed he will need to come in for an  appointment.  He has taken an OTC allergy pill similar to Claritin.  Emergent s/s of URI protocol r/o. Home care advice given. Pt will call if no improvement to scheule an appointment.

## 2012-09-16 ENCOUNTER — Ambulatory Visit (INDEPENDENT_AMBULATORY_CARE_PROVIDER_SITE_OTHER): Payer: Medicare Other | Admitting: Internal Medicine

## 2012-09-16 DIAGNOSIS — D519 Vitamin B12 deficiency anemia, unspecified: Secondary | ICD-10-CM

## 2012-09-16 DIAGNOSIS — D518 Other vitamin B12 deficiency anemias: Secondary | ICD-10-CM

## 2012-09-16 MED ORDER — CYANOCOBALAMIN 1000 MCG/ML IJ SOLN
1000.0000 ug | INTRAMUSCULAR | Status: DC
  Administered 2012-09-16: 1000 ug via INTRAMUSCULAR

## 2012-09-28 ENCOUNTER — Encounter: Payer: Self-pay | Admitting: Internal Medicine

## 2012-09-28 ENCOUNTER — Ambulatory Visit (INDEPENDENT_AMBULATORY_CARE_PROVIDER_SITE_OTHER): Payer: Medicare Other | Admitting: Internal Medicine

## 2012-09-28 VITALS — BP 140/80 | HR 72 | Temp 97.8°F | Resp 16 | Ht 73.0 in | Wt 200.0 lb

## 2012-09-28 DIAGNOSIS — J309 Allergic rhinitis, unspecified: Secondary | ICD-10-CM

## 2012-09-28 DIAGNOSIS — E538 Deficiency of other specified B group vitamins: Secondary | ICD-10-CM

## 2012-09-28 DIAGNOSIS — K222 Esophageal obstruction: Secondary | ICD-10-CM

## 2012-09-28 DIAGNOSIS — T887XXA Unspecified adverse effect of drug or medicament, initial encounter: Secondary | ICD-10-CM

## 2012-09-28 DIAGNOSIS — G4700 Insomnia, unspecified: Secondary | ICD-10-CM

## 2012-09-28 DIAGNOSIS — K589 Irritable bowel syndrome without diarrhea: Secondary | ICD-10-CM

## 2012-09-28 DIAGNOSIS — I10 Essential (primary) hypertension: Secondary | ICD-10-CM

## 2012-09-28 DIAGNOSIS — E785 Hyperlipidemia, unspecified: Secondary | ICD-10-CM

## 2012-09-28 LAB — HEPATIC FUNCTION PANEL
ALT: 15 U/L (ref 0–53)
AST: 21 U/L (ref 0–37)
Albumin: 3.3 g/dL — ABNORMAL LOW (ref 3.5–5.2)
Alkaline Phosphatase: 62 U/L (ref 39–117)
Bilirubin, Direct: 0.1 mg/dL (ref 0.0–0.3)
Total Bilirubin: 0.6 mg/dL (ref 0.3–1.2)
Total Protein: 7.1 g/dL (ref 6.0–8.3)

## 2012-09-28 LAB — LIPID PANEL
Cholesterol: 194 mg/dL (ref 0–200)
HDL: 33 mg/dL — ABNORMAL LOW (ref >39.00)
LDL Cholesterol: 128 mg/dL — ABNORMAL HIGH (ref 0–99)
Total CHOL/HDL Ratio: 6
Triglycerides: 167 mg/dL — ABNORMAL HIGH (ref 0.0–149.0)
VLDL: 33.4 mg/dL (ref 0.0–40.0)

## 2012-09-28 LAB — BASIC METABOLIC PANEL
BUN: 19 mg/dL (ref 6–23)
CO2: 28 mEq/L (ref 19–32)
Calcium: 9.1 mg/dL (ref 8.4–10.5)
Chloride: 105 mEq/L (ref 96–112)
Creatinine, Ser: 0.8 mg/dL (ref 0.4–1.5)
GFR: 108.13 mL/min (ref >60.00)
Glucose, Bld: 78 mg/dL (ref 70–99)
Potassium: 4.9 mEq/L (ref 3.5–5.1)
Sodium: 141 mEq/L (ref 135–145)

## 2012-09-28 MED ORDER — DIPHENOXYLATE-ATROPINE 2.5-0.025 MG PO TABS: 1.0000 | ORAL_TABLET | Freq: Four times a day (QID) | ORAL | Status: DC | PRN

## 2012-09-28 MED ORDER — TEMAZEPAM 15 MG PO CAPS: 15.0000 mg | ORAL_CAPSULE | Freq: Every evening | ORAL | Status: DC | PRN

## 2012-09-28 NOTE — Progress Notes (Signed)
Subjective:    Patient ID: Timothy Gallagher, male    DOB: 08/24/1936, 76 y.o.   MRN: [id number]  HPI  Follow up for HTN Lipid monitering History of irritable bowel syndrome  Review of Systems  Constitutional: Negative for fever and fatigue.  HENT: Negative for hearing loss, congestion, neck pain and postnasal drip.   Eyes: Negative for discharge, redness and visual disturbance.  Respiratory: Negative for cough, shortness of breath and wheezing.   Cardiovascular: Negative for leg swelling.  Gastrointestinal: Negative for abdominal pain, constipation and abdominal distention.  Genitourinary: Negative for urgency and frequency.  Musculoskeletal: Negative for joint swelling and arthralgias.  Skin: Negative for color change and rash.  Neurological: Negative for weakness and light-headedness.  Hematological: Negative for adenopathy.  Psychiatric/Behavioral: Negative for behavioral problems.   Past Medical History  Diagnosis Date  . ACNE ROSACEA 06/27/2009  . ACTINIC KERATOSIS, HEAD 04/18/2009  . Acute maxillary sinusitis 05/14/2010  . ALLERGIC RHINITIS 04/09/2007  . B12 DEFICIENCY 06/07/2007  . BACK PAIN WITH RADICULOPATHY 04/24/2008  . CHEST WALL PAIN, ACUTE 06/15/2009  . Chronic maxillary sinusitis 05/29/2008  . COLITIS 04/27/2009  . COLONIC POLYPS, HX OF 04/27/2009    tubular adenomas  . DERMATITIS, ATOPIC 10/12/2007  . DIVERTICULOSIS, COLON 04/27/2009  . ECCHYMOSES, SPONTANEOUS 06/27/2008  . Elevated sedimentation rate 05/02/2009  . ESOPHAGEAL STRICTURE 04/27/2009  . GASTRITIS, CHRONIC 04/27/2009  . HYPERLIPIDEMIA 03/13/2008  . HYPERTENSION 04/09/2007  . Irritable bowel syndrome 08/11/2007  . KIDNEY DISEASE 04/09/2007  . NECK PAIN 09/14/2008  . NEUROPATHY, IDIOPATHIC PERIPHERAL NEC 08/11/2007  . OSTEOARTHRITIS, WRIST, RIGHT 08/05/2010    History   Social History  . Marital Status: Married    Spouse Name: N/A    Number of Children: 4  . Years of Education: N/A   Occupational History    . retired     from [employer]   Social History Main Topics  . Smoking status: Former Smoker    Quit date: 11/17/2001  . Smokeless tobacco: Never Used  . Alcohol Use: No  . Drug Use: No  . Sexually Active: Yes   Other Topics Concern  . Not on file   Social History Narrative  . No narrative on file    Past Surgical History  Procedure Date  . Hernia repair   . Lamenectomy   . Lumbar fusion   . Tonsillectomy     Family History  Problem Relation Age of Onset  . Hernia Mother   . Heart disease Father   . COPD Father   . Colon cancer Neg Hx     Allergies  Allergen Reactions  . Amoxicillin     REACTION: unspecified  . Atorvastatin     REACTION: nausea and blurred vision  . Ciprofloxacin     REACTION: unspecified  . Mycophenolate Mofetil     REACTION: unspecified  . Prednisone     REACTION: unspecified  . Rosuvastatin     Current Outpatient Prescriptions on File Prior to Visit  Medication Sig Dispense Refill  . acetaminophen (TYLENOL) 500 MG tablet Take 500 mg by mouth every 6 (six) hours as needed.        . diphenoxylate-atropine (LOMOTIL) 2.5-0.025 MG per tablet Take 1 tablet by mouth 4 (four) times daily as needed.  30 tablet  1  . fish oil-omega-3 fatty acids 1000 MG capsule Take 2 g by mouth daily.        . hydrocortisone 0.5 % ointment Apply to rectal area 3 times  daily as needed for hemorrhoids.  30 g  1  . indapamide (LOZOL) 1.25 MG tablet Take 0.5 tablets (0.625 mg total) by mouth daily.  45 tablet  3  . losartan (COZAAR) 50 MG tablet Take 1 tablet (50 mg total) by mouth 2 (two) times daily.  180 tablet  3  . Multiple Vitamin (MULTIVITAMIN) capsule Take 1 capsule by mouth daily.        . nadolol (CORGARD) 20 MG tablet Take 0.5 tablets (10 mg total) by mouth 2 (two) times daily. 1/2 tab twice daily  90 tablet  3  . Nasal Moisturizer Combination SOLN 1 puff by Nasal route 2 (two) times daily.        . Red Yeast Rice 600 MG CAPS Take 1 capsule by mouth 2  (two) times daily.       . temazepam (RESTORIL) 15 MG capsule Take 1 capsule (15 mg total) by mouth at bedtime as needed for sleep.  30 capsule  5  . temazepam (RESTORIL) 15 MG capsule TAKE ONE CAPSULE BY MOUTH AT BEDTIME AS NEEDED FOR SLEEP  30 capsule  0  . traMADol (ULTRAM) 50 MG tablet Take 1 tablet by mouth daily.        BP 140/80  Pulse 72  Temp 97.8 F (36.6 C)  Resp 16  Ht 6\' 1"  (1.854 m)  Wt 200 lb (90.719 kg)  BMI 26.39 kg/m2       Objective:   Physical Exam  Constitutional: He appears well-developed and well-nourished.  HENT:  Head: Normocephalic and atraumatic.  Eyes: Conjunctivae normal are normal. Pupils are equal, round, and reactive to light.  Neck: Normal range of motion. Neck supple.  Cardiovascular: Normal rate and regular rhythm.   Pulmonary/Chest: Effort normal and breath sounds normal.  Abdominal: Soft. Bowel sounds are normal.          Assessment & Plan:  Stable irritable bowel.  He is due for measuring of his lipid and liver for control of hyperlipidemia.  His blood pressure stable on his current regimen.  Mild to moderate osteoarthritis of the knees are with exercise we discussed alternative exercises to walking including stationary bike

## 2012-10-07 ENCOUNTER — Ambulatory Visit (INDEPENDENT_AMBULATORY_CARE_PROVIDER_SITE_OTHER): Payer: Medicare Other | Admitting: Internal Medicine

## 2012-10-07 DIAGNOSIS — D519 Vitamin B12 deficiency anemia, unspecified: Secondary | ICD-10-CM

## 2012-10-07 DIAGNOSIS — D518 Other vitamin B12 deficiency anemias: Secondary | ICD-10-CM

## 2012-10-07 MED ORDER — CYANOCOBALAMIN 1000 MCG/ML IJ SOLN
1000.0000 ug | INTRAMUSCULAR | Status: DC
  Administered 2012-10-07: 1000 ug via INTRAMUSCULAR

## 2012-10-28 ENCOUNTER — Ambulatory Visit (INDEPENDENT_AMBULATORY_CARE_PROVIDER_SITE_OTHER): Payer: Medicare Other | Admitting: Internal Medicine

## 2012-10-28 DIAGNOSIS — E538 Deficiency of other specified B group vitamins: Secondary | ICD-10-CM

## 2012-10-28 MED ORDER — CYANOCOBALAMIN 1000 MCG/ML IJ SOLN
1000.0000 ug | Freq: Once | INTRAMUSCULAR | Status: AC
  Administered 2012-10-28: 1000 ug via INTRAMUSCULAR

## 2012-11-04 ENCOUNTER — Ambulatory Visit (INDEPENDENT_AMBULATORY_CARE_PROVIDER_SITE_OTHER)
Admission: RE | Admit: 2012-11-04 | Discharge: 2012-11-04 | Disposition: A | Discharge status: Home / Self Care | Payer: Medicare Other | Source: Ambulatory Visit | Attending: Family Medicine | Admitting: Family Medicine

## 2012-11-04 ENCOUNTER — Encounter: Payer: Self-pay | Admitting: Family Medicine

## 2012-11-04 ENCOUNTER — Ambulatory Visit (INDEPENDENT_AMBULATORY_CARE_PROVIDER_SITE_OTHER): Payer: Medicare Other | Admitting: Family Medicine

## 2012-11-04 VITALS — BP 150/70 | Temp 97.5°F | Wt 204.0 lb

## 2012-11-04 DIAGNOSIS — M25559 Pain in unspecified hip: Secondary | ICD-10-CM

## 2012-11-04 DIAGNOSIS — M25551 Pain in right hip: Secondary | ICD-10-CM

## 2012-11-04 NOTE — Progress Notes (Signed)
  Subjective:    Patient ID: Timothy Gallagher, male    DOB: 06/10/1936, 76 y.o.   MRN: [id number]  HPI Patient seen with right hip pain. No injury. Somewhat of a dull ache. Worse at night. Mild to moderate intensity. Ambulating without difficulty. Off and on for weeks but especially worse last night. No fever. No appetite or weight changes. No weakness. No lower extremity numbness. No low back pain. Take ibuprofen which helped slightly. No lumbar radiculopathy symptoms.  Past Medical History  Diagnosis Date  . ACNE ROSACEA 06/27/2009  . ACTINIC KERATOSIS, HEAD 04/18/2009  . Acute maxillary sinusitis 05/14/2010  . ALLERGIC RHINITIS 04/09/2007  . B12 DEFICIENCY 06/07/2007  . BACK PAIN WITH RADICULOPATHY 04/24/2008  . CHEST WALL PAIN, ACUTE 06/15/2009  . Chronic maxillary sinusitis 05/29/2008  . COLITIS 04/27/2009  . COLONIC POLYPS, HX OF 04/27/2009    tubular adenomas  . DERMATITIS, ATOPIC 10/12/2007  . DIVERTICULOSIS, COLON 04/27/2009  . ECCHYMOSES, SPONTANEOUS 06/27/2008  . Elevated sedimentation rate 05/02/2009  . ESOPHAGEAL STRICTURE 04/27/2009  . GASTRITIS, CHRONIC 04/27/2009  . HYPERLIPIDEMIA 03/13/2008  . HYPERTENSION 04/09/2007  . Irritable bowel syndrome 08/11/2007  . KIDNEY DISEASE 04/09/2007  . NECK PAIN 09/14/2008  . NEUROPATHY, IDIOPATHIC PERIPHERAL NEC 08/11/2007  . OSTEOARTHRITIS, WRIST, RIGHT 08/05/2010   Past Surgical History  Procedure Date  . Hernia repair   . Lamenectomy   . Lumbar fusion   . Tonsillectomy     reports that he quit smoking about 10 years ago. He has never used smokeless tobacco. He reports that he does not drink alcohol or use illicit drugs. family history includes COPD in his father; Heart disease in his father; and Hernia in his mother.  There is no history of Colon cancer. Allergies  Allergen Reactions  . Amoxicillin     REACTION: unspecified  . Atorvastatin     REACTION: nausea and blurred vision  . Ciprofloxacin     REACTION: unspecified  .  Mycophenolate Mofetil     REACTION: unspecified  . Prednisone     REACTION: unspecified  . Rosuvastatin       Review of Systems  Constitutional: Negative for fever, chills, appetite change and unexpected weight change.  Gastrointestinal: Negative for abdominal pain.  Neurological: Negative for weakness and numbness.  Hematological: Negative for adenopathy.       Objective:   Physical Exam  Constitutional: He appears well-developed and well-nourished.  Cardiovascular: Normal rate and regular rhythm.   Musculoskeletal:       Straight leg raises are negative bilaterally. Fairly good range of motion right hip. No bursa tenderness. No areas of point tenderness.  Neurological:       Full-strength lower extremities. Symmetric reflexes knee and ankle bilaterally.          Assessment & Plan:  Right hip pain. Nonfocal exam. Given duration, start with hip films. Cautious use of ibuprofen with his history of gastritis and esophageal stricture. Try tramadol at night. He has appointment with orthopedist in January scheduled

## 2012-11-05 NOTE — Progress Notes (Signed)
Quick Note:  Pt informed ______ 

## 2012-11-19 ENCOUNTER — Ambulatory Visit (INDEPENDENT_AMBULATORY_CARE_PROVIDER_SITE_OTHER): Payer: Medicare Other | Admitting: Internal Medicine

## 2012-11-19 DIAGNOSIS — D518 Other vitamin B12 deficiency anemias: Secondary | ICD-10-CM

## 2012-11-19 DIAGNOSIS — D519 Vitamin B12 deficiency anemia, unspecified: Secondary | ICD-10-CM

## 2012-11-19 MED ORDER — CYANOCOBALAMIN 1000 MCG/ML IJ SOLN: 1000.0000 ug | INTRAMUSCULAR | Status: DC

## 2012-12-03 ENCOUNTER — Telehealth: Payer: Self-pay | Admitting: Internal Medicine

## 2012-12-03 ENCOUNTER — Encounter: Payer: Self-pay | Admitting: Internal Medicine

## 2012-12-03 ENCOUNTER — Ambulatory Visit (INDEPENDENT_AMBULATORY_CARE_PROVIDER_SITE_OTHER): Payer: Medicare Other | Admitting: Internal Medicine

## 2012-12-03 VITALS — BP 166/100 | HR 76 | Temp 97.6°F | Resp 18 | Wt 204.0 lb

## 2012-12-03 DIAGNOSIS — J309 Allergic rhinitis, unspecified: Secondary | ICD-10-CM

## 2012-12-03 DIAGNOSIS — I10 Essential (primary) hypertension: Secondary | ICD-10-CM

## 2012-12-03 MED ORDER — PREDNISONE 20 MG PO TABS: 20.0000 mg | ORAL_TABLET | Freq: Every day | ORAL | Status: DC

## 2012-12-03 NOTE — Telephone Encounter (Signed)
Patient Information:  Caller Name: [place]  Phone: [phone]  Patient: Timothy Gallagher, Timothy Gallagher  Gender: Male  DOB: 1936-05-08  Age: 77 Years  PCP: Darryll Capers (Adults only)  Office Follow Up:  Does the office need to follow up with this patient?: No  Instructions For The Office: N/A   Symptoms  Reason For Call & Symptoms: Patient states onset of illness since 11/27/12.  States sinus congestion, Head congestion, +sneezing, +runny nose clear. no coughing, Afebrile.  Eating and drinking. Occasionally dizzy, does not feel well.  Describes SOB with exertion for "months now".  Symptoms no better despite saliene rinses and OTC medication all week  Reviewed Health History In EMR: Yes  Reviewed Medications In EMR: Yes  Reviewed Allergies In EMR: Yes  Reviewed Surgeries / Procedures: Yes  Date of Onset of Symptoms: 11/27/2012  Treatments Tried: [facility] Tylenol, nasal sudafed OTC, allergy pill- claritin  Treatments Tried Worked: No  Guideline(s) Used:  Sinus Pain and Congestion  Disposition Per Guideline:   See Today or Tomorrow in Office  Reason For Disposition Reached:   Using nasal washes and pain medicine > 24 hours and sinus pain (lower forehead, cheekbone, or eye) persists  Advice Given:  Reassurance:   Sinus congestion is a normal part of a cold.  Usually home treatment with nasal washes can prevent an actual bacterial sinus infection.  Antibiotics are not helpful for the sinus congestion that occurs with colds.  Here is some care advice that should help.  For a Runny Nose With Profuse Discharge:  Nasal mucus and discharge helps to wash viruses and bacteria out of the nose and sinuses.  Blowing the nose is all that is needed.  If the skin around your nostrils gets irritated, apply a tiny amount of petroleum ointment to the nasal openings once or twice a day.  Hydration:  Drink plenty of liquids (6-8 glasses of water daily). If the air in your home is dry, use a cool mist  humidifier  Appointment Scheduled:  12/03/2012 16:00:00 Appointment Scheduled Provider:  Eleonore Chiquito [facility])

## 2012-12-03 NOTE — Patient Instructions (Signed)
Acute sinusitis symptoms for less than 10 days are generally not helped by antibiotic therapy.  Use saline irrigation, warm  moist compresses and over-the-counter decongestants only as directed.  Call if there is no improvement in 5 to 7 days, or sooner if you develop increasing pain, fever, or any new symptoms.  Prednisone twice daily for 6 days  Nasonex use daily

## 2012-12-03 NOTE — Progress Notes (Signed)
Subjective:    Patient ID: Timothy Gallagher, male    DOB: 24-Feb-1936, 77 y.o.   MRN: [id number]  HPI  77 year old medication who presents for one-week history of sinus congestion and some drainage. He does have a history of allergic rhinitis. He has been using nasal irrigation without benefit. There's been no focal sinus pain or purulent drainage. His chart is marked allergic to prednisone but after interview he apparently had some withdrawal symptoms after sudden discontinuation of prednisone that he had been taking for an inflammatory bowel disorder prescribed by GI in the past.  Past Medical History  Diagnosis Date  . ACNE ROSACEA 06/27/2009  . ACTINIC KERATOSIS, HEAD 04/18/2009  . Acute maxillary sinusitis 05/14/2010  . ALLERGIC RHINITIS 04/09/2007  . B12 DEFICIENCY 06/07/2007  . BACK PAIN WITH RADICULOPATHY 04/24/2008  . CHEST WALL PAIN, ACUTE 06/15/2009  . Chronic maxillary sinusitis 05/29/2008  . COLITIS 04/27/2009  . COLONIC POLYPS, HX OF 04/27/2009    tubular adenomas  . DERMATITIS, ATOPIC 10/12/2007  . DIVERTICULOSIS, COLON 04/27/2009  . ECCHYMOSES, SPONTANEOUS 06/27/2008  . Elevated sedimentation rate 05/02/2009  . ESOPHAGEAL STRICTURE 04/27/2009  . GASTRITIS, CHRONIC 04/27/2009  . HYPERLIPIDEMIA 03/13/2008  . HYPERTENSION 04/09/2007  . Irritable bowel syndrome 08/11/2007  . KIDNEY DISEASE 04/09/2007  . NECK PAIN 09/14/2008  . NEUROPATHY, IDIOPATHIC PERIPHERAL NEC 08/11/2007  . OSTEOARTHRITIS, WRIST, RIGHT 08/05/2010    History   Social History  . Marital Status: Married    Spouse Name: N/A    Number of Children: 4  . Years of Education: N/A   Occupational History  . retired     from [employer]   Social History Main Topics  . Smoking status: Former Smoker    Quit date: 11/17/2001  . Smokeless tobacco: Never Used  . Alcohol Use: No  . Drug Use: No  . Sexually Active: Yes   Other Topics Concern  . Not on file   Social History Narrative  . No narrative on file    Past  Surgical History  Procedure Date  . Hernia repair   . Lamenectomy   . Lumbar fusion   . Tonsillectomy     Family History  Problem Relation Age of Onset  . Hernia Mother   . Heart disease Father   . COPD Father   . Colon cancer Neg Hx     Allergies  Allergen Reactions  . Amoxicillin     REACTION: unspecified  . Atorvastatin     REACTION: nausea and blurred vision  . Ciprofloxacin     REACTION: unspecified  . Mycophenolate Mofetil     REACTION: unspecified  . Rosuvastatin     Current Outpatient Prescriptions on File Prior to Visit  Medication Sig Dispense Refill  . acetaminophen (TYLENOL) 500 MG tablet Take 500 mg by mouth every 6 (six) hours as needed.        . diphenoxylate-atropine (LOMOTIL) 2.5-0.025 MG per tablet Take 1 tablet by mouth 4 (four) times daily as needed.  60 tablet  4  . fish oil-omega-3 fatty acids 1000 MG capsule Take 2 g by mouth daily.        . hydrocortisone 0.5 % ointment Apply to rectal area 3 times daily as needed for hemorrhoids.  30 g  1  . indapamide (LOZOL) 1.25 MG tablet Take 0.5 tablets (0.625 mg total) by mouth daily.  45 tablet  3  . losartan (COZAAR) 50 MG tablet Take 1 tablet (50 mg total) by mouth 2 (two)  times daily.  180 tablet  3  . Multiple Vitamin (MULTIVITAMIN) capsule Take 1 capsule by mouth daily.        . nadolol (CORGARD) 20 MG tablet Take 0.5 tablets (10 mg total) by mouth 2 (two) times daily. 1/2 tab twice daily  90 tablet  3  . Nasal Moisturizer Combination SOLN 1 puff by Nasal route 2 (two) times daily.        . Red Yeast Rice 600 MG CAPS Take 1 capsule by mouth 2 (two) times daily.       . temazepam (RESTORIL) 15 MG capsule Take 1 capsule (15 mg total) by mouth at bedtime as needed for sleep.  30 capsule  5  . traMADol (ULTRAM) 50 MG tablet Take 1 tablet by mouth daily.        BP 166/100  Pulse 76  Temp 97.6 F (36.4 C) (Oral)  Resp 18  Wt 204 lb (92.534 kg)  SpO2 97%       Review of Systems  Constitutional:  Negative for fever, chills, appetite change and fatigue.  HENT: Positive for congestion, rhinorrhea and postnasal drip. Negative for hearing loss, ear pain, sore throat, trouble swallowing, neck stiffness, dental problem, voice change and tinnitus.   Eyes: Negative for pain, discharge and visual disturbance.  Respiratory: Negative for cough, chest tightness, wheezing and stridor.   Cardiovascular: Negative for chest pain, palpitations and leg swelling.  Gastrointestinal: Negative for nausea, vomiting, abdominal pain, diarrhea, constipation, blood in stool and abdominal distention.  Genitourinary: Negative for urgency, hematuria, flank pain, discharge, difficulty urinating and genital sores.  Musculoskeletal: Negative for myalgias, back pain, joint swelling, arthralgias and gait problem.  Skin: Negative for rash.  Neurological: Negative for dizziness, syncope, speech difficulty, weakness, numbness and headaches.  Hematological: Negative for adenopathy. Does not bruise/bleed easily.  Psychiatric/Behavioral: Negative for behavioral problems and dysphoric mood. The patient is not nervous/anxious.        Objective:   Physical Exam  Constitutional: He is oriented to person, place, and time. He appears well-developed.  HENT:  Head: Normocephalic.  Right Ear: External ear normal.  Left Ear: External ear normal.       No maxillary sinus tenderness  Eyes: Conjunctivae normal and EOM are normal.  Neck: Normal range of motion.  Cardiovascular: Normal rate and normal heart sounds.   Pulmonary/Chest: Breath sounds normal.  Abdominal: Bowel sounds are normal.  Musculoskeletal: Normal range of motion. He exhibits no edema and no tenderness.  Neurological: He is alert and oriented to person, place, and time.  Psychiatric: He has a normal mood and affect. His behavior is normal.          Assessment & Plan:   Flare of allergic rhinitis. Will continue saline nasal irrigation. Will treat with a  short course of oral prednisone and start nasal steroids. Will call if unimproved Hypertension

## 2012-12-06 ENCOUNTER — Telehealth: Payer: Self-pay | Admitting: Internal Medicine

## 2012-12-06 NOTE — Telephone Encounter (Signed)
Pt was rx'd prednisone, but when he picked it up at the pharmacy, he said the SIG and QTY are different than discussed. Would like a call back to clarify.

## 2012-12-06 NOTE — Telephone Encounter (Signed)
Patient should take prednisone 20 mg twice daily for 6 days

## 2012-12-06 NOTE — Telephone Encounter (Signed)
Spoke to pt told him to take 20 mg Prednisone twice a day x 6 days per Dr.Kwiatkowski. Pt verbalized understanding.

## 2012-12-09 ENCOUNTER — Ambulatory Visit (INDEPENDENT_AMBULATORY_CARE_PROVIDER_SITE_OTHER): Payer: Medicare Other | Admitting: *Deleted

## 2012-12-09 DIAGNOSIS — E538 Deficiency of other specified B group vitamins: Secondary | ICD-10-CM

## 2012-12-09 MED ORDER — CYANOCOBALAMIN 1000 MCG/ML IJ SOLN
1000.0000 ug | INTRAMUSCULAR | Status: AC
  Administered 2012-12-09: 1000 ug via INTRAMUSCULAR

## 2012-12-23 ENCOUNTER — Encounter: Payer: Self-pay | Admitting: Family Medicine

## 2012-12-23 ENCOUNTER — Ambulatory Visit (INDEPENDENT_AMBULATORY_CARE_PROVIDER_SITE_OTHER): Payer: Medicare Other | Admitting: Family Medicine

## 2012-12-23 VITALS — BP 160/92 | Temp 97.9°F | Wt 200.0 lb

## 2012-12-23 DIAGNOSIS — J329 Chronic sinusitis, unspecified: Secondary | ICD-10-CM

## 2012-12-23 MED ORDER — AZITHROMYCIN 250 MG PO TABS: ORAL_TABLET | ORAL | Status: DC

## 2012-12-23 MED ORDER — FLUTICASONE PROPIONATE 50 MCG/ACT NA SUSP: 2.0000 | Freq: Every day | NASAL | Status: DC

## 2012-12-23 NOTE — Progress Notes (Signed)
  Subjective:    Patient ID: Timothy Gallagher, male    DOB: 1936-11-12, 77 y.o.   MRN: [id number]  HPI Here for one week of sinus pressure, nasal congestion, PND, and a dry cough. No fever.    Review of Systems  Constitutional: Negative.   HENT: Positive for congestion, postnasal drip and sinus pressure.   Eyes: Negative.   Respiratory: Positive for cough.        Objective:   Physical Exam  Constitutional: He appears well-developed and well-nourished.  HENT:  Right Ear: External ear normal.  Left Ear: External ear normal.  Nose: Nose normal.  Mouth/Throat: Oropharynx is clear and moist.  Eyes: Conjunctivae normal are normal.  Pulmonary/Chest: Effort normal and breath sounds normal.  Lymphadenopathy:    He has no cervical adenopathy.          Assessment & Plan:  Add Mucinex prn

## 2012-12-28 ENCOUNTER — Ambulatory Visit: Payer: Medicare Other | Admitting: Gastroenterology

## 2012-12-30 ENCOUNTER — Ambulatory Visit: Payer: Medicare Other | Admitting: *Deleted

## 2013-01-26 ENCOUNTER — Telehealth: Payer: Self-pay | Admitting: Internal Medicine

## 2013-01-26 ENCOUNTER — Ambulatory Visit (INDEPENDENT_AMBULATORY_CARE_PROVIDER_SITE_OTHER): Payer: Medicare Other | Admitting: Internal Medicine

## 2013-01-26 ENCOUNTER — Encounter: Payer: Self-pay | Admitting: Internal Medicine

## 2013-01-26 VITALS — BP 130/80 | HR 64 | Temp 98.0°F | Wt 201.0 lb

## 2013-01-26 DIAGNOSIS — G4700 Insomnia, unspecified: Secondary | ICD-10-CM

## 2013-01-26 DIAGNOSIS — E538 Deficiency of other specified B group vitamins: Secondary | ICD-10-CM

## 2013-01-26 DIAGNOSIS — J329 Chronic sinusitis, unspecified: Secondary | ICD-10-CM

## 2013-01-26 MED ORDER — CYANOCOBALAMIN 500 MCG/0.1ML NA SOLN: 50.0000 ug | NASAL | Status: DC

## 2013-01-26 MED ORDER — FLUTICASONE PROPIONATE 50 MCG/ACT NA SUSP: 2.0000 | Freq: Every day | NASAL | Status: DC

## 2013-01-26 MED ORDER — TEMAZEPAM 15 MG PO CAPS: 15.0000 mg | ORAL_CAPSULE | Freq: Every evening | ORAL | Status: DC | PRN

## 2013-01-26 NOTE — Telephone Encounter (Signed)
Will call around and check with pharmacies- will buy an d bring here if he can find any.  If not will take 1000 mcg sublingual

## 2013-01-26 NOTE — Telephone Encounter (Signed)
Pt left me a voicemail regarding his Cyanocobalamin (NASCOBAL) 500 MCG/0.1ML SOLN It is not covered by his insurance. I checked w/pharmacy. Medicare does not cover B12 products, or really any Vitamin/mineral supplement type rx's. Please advise and call pt. He said it was over $400 for the spray.

## 2013-01-26 NOTE — Progress Notes (Signed)
Subjective:    Patient ID: Timothy Gallagher, male    DOB: 02/28/1936, 77 y.o.   MRN: [id number]  HPI Ligament/ knee tendonopathy reviewed sleep medications reviewed cialis     Review of Systems  Constitutional: Negative for fever and fatigue.  HENT: Negative for hearing loss, congestion, neck pain and postnasal drip.   Eyes: Negative for discharge, redness and visual disturbance.  Respiratory: Negative for cough, shortness of breath and wheezing.   Cardiovascular: Negative for leg swelling.  Gastrointestinal: Negative for abdominal pain, constipation and abdominal distention.  Genitourinary: Negative for urgency and frequency.  Musculoskeletal: Negative for joint swelling and arthralgias.  Skin: Negative for color change and rash.  Neurological: Negative for weakness and light-headedness.  Hematological: Negative for adenopathy.  Psychiatric/Behavioral: Negative for behavioral problems.   Past Medical History  Diagnosis Date  . ACNE ROSACEA 06/27/2009  . ACTINIC KERATOSIS, HEAD 04/18/2009  . Acute maxillary sinusitis 05/14/2010  . ALLERGIC RHINITIS 04/09/2007  . B12 DEFICIENCY 06/07/2007  . BACK PAIN WITH RADICULOPATHY 04/24/2008  . CHEST WALL PAIN, ACUTE 06/15/2009  . Chronic maxillary sinusitis 05/29/2008  . COLITIS 04/27/2009  . COLONIC POLYPS, HX OF 04/27/2009    tubular adenomas  . DERMATITIS, ATOPIC 10/12/2007  . DIVERTICULOSIS, COLON 04/27/2009  . ECCHYMOSES, SPONTANEOUS 06/27/2008  . Elevated sedimentation rate 05/02/2009  . ESOPHAGEAL STRICTURE 04/27/2009  . GASTRITIS, CHRONIC 04/27/2009  . HYPERLIPIDEMIA 03/13/2008  . HYPERTENSION 04/09/2007  . Irritable bowel syndrome 08/11/2007  . KIDNEY DISEASE 04/09/2007  . NECK PAIN 09/14/2008  . NEUROPATHY, IDIOPATHIC PERIPHERAL NEC 08/11/2007  . OSTEOARTHRITIS, WRIST, RIGHT 08/05/2010    History   Social History  . Marital Status: Married    Spouse Name: N/A    Number of Children: 4  . Years of Education: N/A   Occupational  History  . retired     from [employer]   Social History Main Topics  . Smoking status: Former Smoker    Quit date: 11/17/2001  . Smokeless tobacco: Never Used  . Alcohol Use: No  . Drug Use: No  . Sexually Active: Yes   Other Topics Concern  . Not on file   Social History Narrative  . No narrative on file    Past Surgical History  Procedure Laterality Date  . Hernia repair    . Lamenectomy    . Lumbar fusion    . Tonsillectomy      Family History  Problem Relation Age of Onset  . Hernia Mother   . Heart disease Father   . COPD Father   . Colon cancer Neg Hx     Allergies  Allergen Reactions  . Amoxicillin     REACTION: unspecified  . Atorvastatin     REACTION: nausea and blurred vision  . Ciprofloxacin     REACTION: unspecified  . Mycophenolate Mofetil     REACTION: unspecified  . Rosuvastatin     Current Outpatient Prescriptions on File Prior to Visit  Medication Sig Dispense Refill  . acetaminophen (TYLENOL) 500 MG tablet Take 500 mg by mouth every 6 (six) hours as needed.        . diphenoxylate-atropine (LOMOTIL) 2.5-0.025 MG per tablet Take 1 tablet by mouth 4 (four) times daily as needed.  60 tablet  4  . fish oil-omega-3 fatty acids 1000 MG capsule Take 2 g by mouth daily.        . hydrocortisone 0.5 % ointment Apply to rectal area 3 times daily as needed for hemorrhoids.  30 g  1  . indapamide (LOZOL) 1.25 MG tablet Take 0.5 tablets (0.625 mg total) by mouth daily.  45 tablet  3  . losartan (COZAAR) 50 MG tablet Take 1 tablet (50 mg total) by mouth 2 (two) times daily.  180 tablet  3  . Multiple Vitamin (MULTIVITAMIN) capsule Take 1 capsule by mouth daily.        . nadolol (CORGARD) 20 MG tablet Take 0.5 tablets (10 mg total) by mouth 2 (two) times daily. 1/2 tab twice daily  90 tablet  3  . Red Yeast Rice 600 MG CAPS Take 1 capsule by mouth 2 (two) times daily.       . silodosin (RAPAFLO) 4 MG CAPS capsule Take 4 mg by mouth daily with breakfast.       . traMADol (ULTRAM) 50 MG tablet Take 1 tablet by mouth daily.       No current facility-administered medications on file prior to visit.    BP 130/80  Pulse 64  Temp(Src) 98 F (36.7 C) (Oral)  Wt 201 lb (91.173 kg)  BMI 26.52 kg/m2       Objective:   Physical Exam  Nursing note reviewed. Constitutional: He appears well-developed and well-nourished.  HENT:  Head: Normocephalic and atraumatic.  Eyes: Conjunctivae are normal. Pupils are equal, round, and reactive to light.  Neck: Normal range of motion. Neck supple.  Cardiovascular: Normal rate and regular rhythm.   Pulmonary/Chest: Effort normal and breath sounds normal.  Abdominal: Soft. Bowel sounds are normal.          Assessment & Plan:  Allergies Refill nasal steroids Discussion of b12 nasal since there is a shortage of b12 injection Stable blood pressure Refill insomnia medication

## 2013-01-26 NOTE — Patient Instructions (Signed)
Be very carefull about the use of prednisone and your blood pressure

## 2013-02-11 ENCOUNTER — Ambulatory Visit (INDEPENDENT_AMBULATORY_CARE_PROVIDER_SITE_OTHER): Payer: Medicare Other | Admitting: Cardiovascular Disease

## 2013-02-11 ENCOUNTER — Encounter: Payer: Self-pay | Admitting: Cardiovascular Disease

## 2013-02-11 VITALS — BP 175/94 | HR 79 | Wt 205.0 lb

## 2013-02-11 DIAGNOSIS — E785 Hyperlipidemia, unspecified: Secondary | ICD-10-CM

## 2013-02-11 DIAGNOSIS — R002 Palpitations: Secondary | ICD-10-CM

## 2013-02-11 DIAGNOSIS — I10 Essential (primary) hypertension: Secondary | ICD-10-CM

## 2013-02-11 MED ORDER — PROPRANOLOL HCL 10 MG PO TABS: 10.0000 mg | ORAL_TABLET | ORAL | Status: DC | PRN

## 2013-02-11 NOTE — Patient Instructions (Addendum)
Your physician recommends that you schedule a follow-up appointment in:  AS NEEDED Your physician has recommended you make the following change in your medication:  MAY TAKE PROPRANOLOL 10 MG  1 EVERY DAY AS NEEDED FOR  PALPITATIONS

## 2013-02-11 NOTE — Assessment & Plan Note (Signed)
Benign with no evidence of structural heart disease Continue corgard and use PRN inderal at night. No need for ETT or echo at this point

## 2013-02-11 NOTE — Assessment & Plan Note (Signed)
Well controlled.  Continue current medications and low sodium Dash type diet.    

## 2013-02-11 NOTE — Progress Notes (Signed)
Patient ID: Timothy Gallagher, male   DOB: June 26, 1936, 77 y.o.   MRN: [id number] 77 yo with palpitations for a year. Nonexertional Occur at night Worse in recumbancy Can last an hour or two.  Goes to sleep and is fine. No chest pain dyspnea or syncope On corgard.  No excess ETOH, stimulants or cafeine Does use cialis periodically.    ROS: Denies fever, malais, weight loss, blurry vision, decreased visual acuity, cough, sputum, SOB, hemoptysis, pleuritic pain, palpitaitons, heartburn, abdominal pain, melena, lower extremity edema, claudication, or rash.  All other systems reviewed and negative   General: Affect appropriate Healthy:  appears stated age HEENT: normal Neck supple with no adenopathy JVP normal no bruits no thyromegaly Lungs clear with no wheezing and good diaphragmatic motion Heart:  S1/S2 no murmur,rub, gallop or click PMI normal Abdomen: benighn, BS positve, no tenderness, no AAA no bruit.  No HSM or HJR Distal pulses intact with no bruits No edema Neuro non-focal Skin warm and dry No muscular weakness  Medications Current Outpatient Prescriptions  Medication Sig Dispense Refill  . acetaminophen (TYLENOL) 500 MG tablet Take 500 mg by mouth every 6 (six) hours as needed.        . Cyanocobalamin (NASCOBAL) 500 MCG/0.1ML SOLN Place 0.01 mLs (50 mcg total) into the nose once a week.  2.3 mL  6  . diphenoxylate-atropine (LOMOTIL) 2.5-0.025 MG per tablet Take 1 tablet by mouth 4 (four) times daily as needed.  60 tablet  4  . fish oil-omega-3 fatty acids 1000 MG capsule Take 2 g by mouth daily.        . fluticasone (FLONASE) 50 MCG/ACT nasal spray Place 2 sprays into the nose daily.  16 g  6  . hydrocortisone 0.5 % ointment Apply to rectal area 3 times daily as needed for hemorrhoids.  30 g  1  . losartan (COZAAR) 50 MG tablet Take 50 mg by mouth daily.      . Melatonin 5 MG TABS Take 1 tablet by mouth daily.      . Multiple Vitamin (MULTIVITAMIN) capsule Take 1 capsule by mouth  daily.        . nadolol (CORGARD) 20 MG tablet 1/2 tab twice daily      . Red Yeast Rice 600 MG CAPS Take 1 capsule by mouth 2 (two) times daily.       . silodosin (RAPAFLO) 4 MG CAPS capsule Take 4 mg by mouth daily with breakfast.      . temazepam (RESTORIL) 15 MG capsule Take 1 capsule (15 mg total) by mouth at bedtime as needed for sleep.  30 capsule  5  . traMADol (ULTRAM) 50 MG tablet Take 1 tablet by mouth daily.      [name] Kitchen triamcinolone (NASACORT) 55 MCG/ACT nasal inhaler Place 2 sprays into the nose daily.       No current facility-administered medications for this visit.    Allergies Amoxicillin; Atorvastatin; Ciprofloxacin; Mycophenolate mofetil; and Rosuvastatin  Family History: Family History  Problem Relation Age of Onset  . Hernia Mother   . Heart disease Father   . COPD Father   . Colon cancer Neg Hx     Social History: History   Social History  . Marital Status: Married    Spouse Name: N/A    Number of Children: 4  . Years of Education: N/A   Occupational History  . retired     from [employer]   Social History Main Topics  . Smoking  status: Former Smoker    Quit date: 11/17/2001  . Smokeless tobacco: Never Used  . Alcohol Use: No  . Drug Use: No  . Sexually Active: Yes   Other Topics Concern  . Not on file   Social History Narrative  . No narrative on file    Electrocardiogram:   SR rate 79 PAC, PVC otherwise normal. compeared to 03/22/12 rate 57 and no PAC/PVC  Assessment and Plan

## 2013-02-11 NOTE — Addendum Note (Signed)
Addended by: Scherrie Bateman E on: 02/11/2013 02:41 PM   Modules accepted: Orders

## 2013-02-11 NOTE — Assessment & Plan Note (Signed)
Cholesterol is at goal.  Continue current dose of statin and diet Rx.  No myalgias or side effects.  F/U  LFT's in 6 months. Lab Results  Component Value Date   LDLCALC 128* 09/28/2012

## 2013-02-22 ENCOUNTER — Telehealth: Payer: Self-pay | Admitting: Internal Medicine

## 2013-02-22 NOTE — Telephone Encounter (Signed)
Patient Information:  Caller Name: Taquan  Phone: [phone]  Patient: Timothy, Gallagher  Gender: Male  DOB: 07-Apr-1936  Age: 77 Years  PCP: Darryll Capers (Adults only)  Office Follow Up:  Does the office need to follow up with this patient?: No  Instructions For The Office: N/A  RN Note:  Has had a few days of head congestion and the last 2 days he has had a sorethroat.  He said it was bad enough last night he had trouble sleeping,  He said since he got up this morning and drank something warm it is improving slightly  He said he will give it a couple days and if isn't improving will call back for appt.  Symptoms  Reason For Call & Symptoms: sorethroat  Reviewed Health History In EMR: Yes  Reviewed Medications In EMR: Yes  Reviewed Allergies In EMR: Yes  Reviewed Surgeries / Procedures: Yes  Date of Onset of Symptoms: 02/17/2013  Guideline(s) Used:  Sore Throat  Disposition Per Guideline:   Strep Test Only Visit Today or Tomorrow  Reason For Disposition Reached:   Sore throat is the main symptom and persists > 48 hours  Advice Given:  For Relief of Sore Throat Pain:  Sip warm chicken broth or apple juice.  Suck on hard candy or a throat lozenge (over-the-counter).  Gargle warm salt water 3 times daily (1 teaspoon of salt in 8 oz or 240 ml of warm water).  Pain Medicines:  For pain relief, you can take either acetaminophen, ibuprofen, or naproxen.  Soft Diet:   Cold drinks and milk shakes are especially good (Reason: swollen tonsils can make some foods hard to swallow).  Liquids:  Adequate liquid intake is important to prevent dehydration. Drink 6-8 glasses of water per day.  Call Back If:  You become worse.  RN Overrode Recommendation:  Document Patient  going to try home care for a couple days and then if not improving will call for appt

## 2013-02-23 ENCOUNTER — Telehealth: Payer: Self-pay | Admitting: Internal Medicine

## 2013-02-23 ENCOUNTER — Ambulatory Visit (INDEPENDENT_AMBULATORY_CARE_PROVIDER_SITE_OTHER): Payer: Medicare Other | Admitting: Family Medicine

## 2013-02-23 ENCOUNTER — Encounter: Payer: Self-pay | Admitting: Family Medicine

## 2013-02-23 VITALS — BP 160/82 | Temp 97.9°F

## 2013-02-23 DIAGNOSIS — R110 Nausea: Secondary | ICD-10-CM

## 2013-02-23 NOTE — Progress Notes (Signed)
  Subjective:    Patient ID: Timothy Gallagher, male    DOB: [birth year], 77 y.o.   MRN: [id number]  HPI  Acute visit 4 day hx of nausea but no vomiting. No significant diarrhea No fever.  No abdominal pain Has some sore throat.  Increased PND past few days. Using Flonase but still has some drainage.   Past Medical History  Diagnosis Date  . ACNE ROSACEA 06/27/2009  . ACTINIC KERATOSIS, HEAD 04/18/2009  . Acute maxillary sinusitis 05/14/2010  . ALLERGIC RHINITIS 04/09/2007  . B12 DEFICIENCY 06/07/2007  . BACK PAIN WITH RADICULOPATHY 04/24/2008  . CHEST WALL PAIN, ACUTE 06/15/2009  . Chronic maxillary sinusitis 05/29/2008  . COLITIS 04/27/2009  . COLONIC POLYPS, HX OF 04/27/2009    tubular adenomas  . DERMATITIS, ATOPIC 10/12/2007  . DIVERTICULOSIS, COLON 04/27/2009  . ECCHYMOSES, SPONTANEOUS 06/27/2008  . Elevated sedimentation rate 05/02/2009  . ESOPHAGEAL STRICTURE 04/27/2009  . GASTRITIS, CHRONIC 04/27/2009  . HYPERLIPIDEMIA 03/13/2008  . HYPERTENSION 04/09/2007  . Irritable bowel syndrome 08/11/2007  . KIDNEY DISEASE 04/09/2007  . NECK PAIN 09/14/2008  . NEUROPATHY, IDIOPATHIC PERIPHERAL NEC 08/11/2007  . OSTEOARTHRITIS, WRIST, RIGHT 08/05/2010   Past Surgical History  Procedure Laterality Date  . Hernia repair    . Lamenectomy    . Lumbar fusion    . Tonsillectomy      reports that he quit smoking about 11 years ago. He has never used smokeless tobacco. He reports that he does not drink alcohol or use illicit drugs. family history includes COPD in his father; Heart disease in his father; and Hernia in his mother.  There is no history of Colon cancer. Allergies  Allergen Reactions  . Amoxicillin     REACTION: unspecified  . Atorvastatin     REACTION: nausea and blurred vision  . Ciprofloxacin     REACTION: unspecified  . Mycophenolate Mofetil     REACTION: unspecified  . Rosuvastatin   '  Review of Systems  Constitutional: Negative for fever, chills and unexpected weight  change.  HENT: Positive for postnasal drip.   Respiratory: Negative for cough and shortness of breath.   Cardiovascular: Negative for chest pain.  Gastrointestinal: Positive for nausea. Negative for vomiting, abdominal pain and blood in stool.  Genitourinary: Negative for dysuria.  Neurological: Negative for dizziness, syncope and headaches.       Objective:   Physical Exam  Constitutional: He appears well-developed and well-nourished.  HENT:  Right Ear: External ear normal.  Left Ear: External ear normal.  Mouth/Throat: Oropharynx is clear and moist.  Neck: Neck supple. No thyromegaly present.  Cardiovascular: Normal rate and regular rhythm.   Pulmonary/Chest: Effort normal and breath sounds normal.  Abdominal: Soft. Bowel sounds are normal. He exhibits no distension and no mass. There is no tenderness. There is no rebound and no guarding.  Lymphadenopathy:    He has no cervical adenopathy.          Assessment & Plan:  Nausea. Suspect related to postnasal drip allergy symptoms. Start Allegra 180 mg once daily. Continue Flonase. Followup promptly for any vomiting abdominal pain or new symptoms

## 2013-02-23 NOTE — Patient Instructions (Addendum)
Try adding Allegra once daily for allergies and continue with Flonase

## 2013-02-23 NOTE — Telephone Encounter (Signed)
Jae  Caling to [employer] as recommended at office visit 02/23/13. Reviewed with patient per EPIC chart.

## 2013-03-02 ENCOUNTER — Telehealth: Payer: Self-pay | Admitting: Internal Medicine

## 2013-03-02 NOTE — Telephone Encounter (Signed)
Patient Information:  Caller Name: Ambrosio  Phone: [phone]  Patient: Timothy Gallagher, Timothy Gallagher  Gender: Male  DOB: 01/08/1936  Age: 77 Years  PCP: Darryll Capers (Adults only)  Office Follow Up:  Does the office need to follow up with this patient?: Yes  Instructions For The Office: Had an episode of SOB after rushing down driveway.  Now resolved.  Has had heart work up and was no abnormalities noted.  Patient was wondering if pollen could cause the episode. RN noted it could be possible but patient denied stuffy nose.  Please follow up if needed.  RN Note:  Patient has had a complete work up for heart recently and looked at by office provider.  Was put on oral allergy medication and has been much better with no SOB.  Today he had an episode where he was rushing to take things to the end of a driveway-slight hill involved and got very winded.  Has resolved now but he was wondering if it could be pollen related. Triaged with care advice given.  Caller demonstrated his understanding.  RN will forward question to office.  Symptoms  Reason For Call & Symptoms: Short of breath at times.  Is it coming from pollens and allergies. Has been sitting around more than usual  Reviewed Health History In EMR: Yes  Reviewed Medications In EMR: Yes  Reviewed Allergies In EMR: Yes  Reviewed Surgeries / Procedures: Yes  Date of Onset of Symptoms: 03/02/2013  Guideline(s) Used:  Breathing Difficulty  Disposition Per Guideline:   See Within 2 Weeks in Office  Reason For Disposition Reached:   Mild longstanding difficulty breathing (e.g., speaks in phrases, SOB even at rest, pulse 100-120) and same as normal  Advice Given:  Call Back If:  Severe difficulty breathing occurs  Fever more than 100.5 F (38.1 C)  You become worse.  RN Overrode Recommendation:  Follow Up With Office Later  RN will forward question to office.

## 2013-03-03 NOTE — Telephone Encounter (Signed)
Talked with pt and he has been sitting around more than usual and thinks he may be deconditioned. Saw card 1-2 weeks ago and every thing ok. Pt just put on allergy med,allegra.  Told pt to continue to take and if this continues to happen make ov to see pcc.

## 2013-03-09 ENCOUNTER — Other Ambulatory Visit: Payer: Self-pay | Admitting: *Deleted

## 2013-03-15 ENCOUNTER — Ambulatory Visit: Payer: Medicare Other | Admitting: Gastroenterology

## 2013-03-18 ENCOUNTER — Encounter: Payer: Self-pay | Admitting: Internal Medicine

## 2013-03-18 ENCOUNTER — Ambulatory Visit (INDEPENDENT_AMBULATORY_CARE_PROVIDER_SITE_OTHER): Payer: Medicare Other | Admitting: Internal Medicine

## 2013-03-18 VITALS — BP 130/80 | HR 72 | Temp 98.2°F | Resp 16 | Ht 73.0 in | Wt 200.0 lb

## 2013-03-18 DIAGNOSIS — I10 Essential (primary) hypertension: Secondary | ICD-10-CM

## 2013-03-18 DIAGNOSIS — K589 Irritable bowel syndrome without diarrhea: Secondary | ICD-10-CM

## 2013-03-18 DIAGNOSIS — R109 Unspecified abdominal pain: Secondary | ICD-10-CM

## 2013-03-18 DIAGNOSIS — E785 Hyperlipidemia, unspecified: Secondary | ICD-10-CM

## 2013-03-18 DIAGNOSIS — K222 Esophageal obstruction: Secondary | ICD-10-CM

## 2013-03-18 MED ORDER — TRAMADOL HCL 50 MG PO TABS: 50.0000 mg | ORAL_TABLET | Freq: Four times a day (QID) | ORAL | Status: DC | PRN

## 2013-03-18 NOTE — Progress Notes (Signed)
Subjective:    Patient ID: Timothy Gallagher, male    DOB: October 28, 1936, 77 y.o.   MRN: [id number]  HPI Patient is a 77-year-old male who is followed for multiple medical problems including hypertension on into  Losol, cosartin and Corgard and has been stable Patient saw cardiology for "pvc's" Patient has recurrent IBS symptoms   Review of Systems  Constitutional: Negative for fever and fatigue.  HENT: Negative for hearing loss, congestion, neck pain and postnasal drip.   Eyes: Negative for discharge, redness and visual disturbance.  Respiratory: Negative for cough, shortness of breath and wheezing.   Cardiovascular: Negative for leg swelling.  Gastrointestinal: Negative for abdominal pain, constipation and abdominal distention.  Genitourinary: Negative for urgency and frequency.  Musculoskeletal: Negative for joint swelling and arthralgias.  Skin: Negative for color change and rash.  Neurological: Negative for weakness and light-headedness.  Hematological: Negative for adenopathy.  Psychiatric/Behavioral: Negative for behavioral problems.   Past Medical History  Diagnosis Date  . ACNE ROSACEA 06/27/2009  . ACTINIC KERATOSIS, HEAD 04/18/2009  . Acute maxillary sinusitis 05/14/2010  . ALLERGIC RHINITIS 04/09/2007  . B12 DEFICIENCY 06/07/2007  . BACK PAIN WITH RADICULOPATHY 04/24/2008  . CHEST WALL PAIN, ACUTE 06/15/2009  . Chronic maxillary sinusitis 05/29/2008  . COLITIS 04/27/2009  . COLONIC POLYPS, HX OF 04/27/2009    tubular adenomas  . DERMATITIS, ATOPIC 10/12/2007  . DIVERTICULOSIS, COLON 04/27/2009  . ECCHYMOSES, SPONTANEOUS 06/27/2008  . Elevated sedimentation rate 05/02/2009  . ESOPHAGEAL STRICTURE 04/27/2009  . GASTRITIS, CHRONIC 04/27/2009  . HYPERLIPIDEMIA 03/13/2008  . HYPERTENSION 04/09/2007  . Irritable bowel syndrome 08/11/2007  . KIDNEY DISEASE 04/09/2007  . NECK PAIN 09/14/2008  . NEUROPATHY, IDIOPATHIC PERIPHERAL NEC 08/11/2007  . OSTEOARTHRITIS, WRIST, RIGHT 08/05/2010     History   Social History  . Marital Status: Married    Spouse Name: N/A    Number of Children: 4  . Years of Education: N/A   Occupational History  . retired     from [employer]   Social History Main Topics  . Smoking status: Former Smoker    Quit date: 11/17/2001  . Smokeless tobacco: Never Used  . Alcohol Use: No  . Drug Use: No  . Sexually Active: Yes   Other Topics Concern  . Not on file   Social History Narrative  . No narrative on file    Past Surgical History  Procedure Laterality Date  . Hernia repair    . Lamenectomy    . Lumbar fusion    . Tonsillectomy      Family History  Problem Relation Age of Onset  . Hernia Mother   . Heart disease Father   . COPD Father   . Colon cancer Neg Hx     Allergies  Allergen Reactions  . Amoxicillin     REACTION: unspecified  . Atorvastatin     REACTION: nausea and blurred vision  . Ciprofloxacin     REACTION: unspecified  . Mycophenolate Mofetil     REACTION: unspecified  . Rosuvastatin     Current Outpatient Prescriptions on File Prior to Visit  Medication Sig Dispense Refill  . acetaminophen (TYLENOL) 500 MG tablet Take 500 mg by mouth every 6 (six) hours as needed.        . diphenoxylate-atropine (LOMOTIL) 2.5-0.025 MG per tablet Take 1 tablet by mouth 4 (four) times daily as needed.  60 tablet  4  . fish oil-omega-3 fatty acids 1000 MG capsule Take 2 g by mouth  daily.        . fluticasone (FLONASE) 50 MCG/ACT nasal spray Place 2 sprays into the nose daily.  16 g  6  . hydrocortisone 0.5 % ointment Apply to rectal area 3 times daily as needed for hemorrhoids.  30 g  1  . indapamide (LOZOL) 1.25 MG tablet Take 1.25 mg by mouth daily.       [name] Kitchen losartan (COZAAR) 50 MG tablet Take 50 mg by mouth daily.      . Melatonin 5 MG TABS Take 1 tablet by mouth daily.      . Multiple Vitamin (MULTIVITAMIN) capsule Take 1 capsule by mouth daily.        . nadolol (CORGARD) 20 MG tablet 1/2 tab twice daily      .  Red Yeast Rice 600 MG CAPS Take 1 capsule by mouth 2 (two) times daily.       . silodosin (RAPAFLO) 4 MG CAPS capsule Take 4 mg by mouth daily with breakfast.      . temazepam (RESTORIL) 15 MG capsule Take 1 capsule (15 mg total) by mouth at bedtime as needed for sleep.  30 capsule  5  . traMADol (ULTRAM) 50 MG tablet Take 1 tablet by mouth daily.      [name] Kitchen triamcinolone (NASACORT) 55 MCG/ACT nasal inhaler Place 2 sprays into the nose daily.       No current facility-administered medications on file prior to visit.    BP 130/80  Pulse 72  Temp(Src) 98.2 F (36.8 C)  Resp 16  Ht 6\' 1"  (1.854 m)  Wt 200 lb (90.719 kg)  BMI 26.39 kg/m2       Objective:   Physical Exam  Nursing note and vitals reviewed. Constitutional: He appears well-developed and well-nourished.  HENT:  Head: Normocephalic and atraumatic.  Eyes: Conjunctivae are normal. Pupils are equal, round, and reactive to light.  Neck: Normal range of motion. Neck supple.  Cardiovascular: Normal rate and regular rhythm.   Pulmonary/Chest: Effort normal and breath sounds normal.  Abdominal: Soft. Bowel sounds are normal. He exhibits distension. There is tenderness.          Assessment & Plan:  Resume probiotic therapy samples of align given to the patient for the next 90 days prior the align protocol Stable upper respiratory tract allergies Blood pressure stable Rosacea stable IBS mild flare resume probiotic

## 2013-04-25 ENCOUNTER — Ambulatory Visit: Payer: Medicare Other | Admitting: Family Medicine

## 2013-04-29 ENCOUNTER — Ambulatory Visit: Payer: Medicare Other | Admitting: Internal Medicine

## 2013-05-04 ENCOUNTER — Ambulatory Visit (INDEPENDENT_AMBULATORY_CARE_PROVIDER_SITE_OTHER): Payer: Medicare Other | Admitting: Family Medicine

## 2013-05-04 ENCOUNTER — Encounter: Payer: Self-pay | Admitting: Family Medicine

## 2013-05-04 VITALS — BP 126/80 | HR 63 | Temp 97.6°F | Wt 200.0 lb

## 2013-05-04 DIAGNOSIS — H8110 Benign paroxysmal vertigo, unspecified ear: Secondary | ICD-10-CM

## 2013-05-04 MED ORDER — MECLIZINE HCL 25 MG PO TABS: 25.0000 mg | ORAL_TABLET | ORAL | Status: DC | PRN

## 2013-05-04 NOTE — Progress Notes (Signed)
  Subjective:    Patient ID: Timothy Gallagher, male    DOB: 10-31-1936, 77 y.o.   MRN: [id number]  HPI Here for several episodes of dizziness in the past week. Once time this started when he was looking up in the sky to see a helicopter, and the other time was when he got up out of bed during the night. Each time he felt as if the room was spinning and he felt a little nauseated. Each episodes went away after about 30 seconds. He has felt fine the past 3 days. No blurred vision or HA.    Review of Systems  Constitutional: Negative.   HENT: Negative.   Eyes: Negative.   Respiratory: Negative.   Cardiovascular: Negative.   Neurological: Negative for tremors, seizures, syncope, facial asymmetry, speech difficulty, weakness, light-headedness, numbness and headaches.       Objective:   Physical Exam  Constitutional: He is oriented to person, place, and time. He appears well-developed and well-nourished. No distress.  HENT:  Head: Normocephalic and atraumatic.  Right Ear: External ear normal.  Left Ear: External ear normal.  Eyes: Conjunctivae are normal. Pupils are equal, round, and reactive to light.  Neurological: He is alert and oriented to person, place, and time. He has normal reflexes. No cranial nerve deficit. He exhibits normal muscle tone. Coordination normal.          Assessment & Plan:   benign vertigo. Use Meclizine prn. Drink fluids. Stay on Allegra as usual. Recheck prn

## 2013-05-24 ENCOUNTER — Encounter: Payer: Self-pay | Admitting: Gastroenterology

## 2013-05-24 ENCOUNTER — Ambulatory Visit: Payer: Medicare Other | Admitting: Gastroenterology

## 2013-05-24 ENCOUNTER — Ambulatory Visit (INDEPENDENT_AMBULATORY_CARE_PROVIDER_SITE_OTHER): Payer: Medicare Other | Admitting: Gastroenterology

## 2013-05-24 ENCOUNTER — Other Ambulatory Visit (INDEPENDENT_AMBULATORY_CARE_PROVIDER_SITE_OTHER): Payer: Medicare Other

## 2013-05-24 VITALS — BP 158/82 | HR 64 | Ht 73.0 in | Wt 201.2 lb

## 2013-05-24 DIAGNOSIS — K5730 Diverticulosis of large intestine without perforation or abscess without bleeding: Secondary | ICD-10-CM

## 2013-05-24 DIAGNOSIS — K589 Irritable bowel syndrome without diarrhea: Secondary | ICD-10-CM

## 2013-05-24 DIAGNOSIS — R1031 Right lower quadrant pain: Secondary | ICD-10-CM

## 2013-05-24 LAB — CBC WITH DIFFERENTIAL/PLATELET
Basophils Absolute: 0 10*3/uL (ref 0.0–0.1)
Basophils Relative: 0.2 % (ref 0.0–3.0)
Eosinophils Absolute: 0.2 10*3/uL (ref 0.0–0.7)
Eosinophils Relative: 4 % (ref 0.0–5.0)
HCT: 38.1 % — ABNORMAL LOW (ref 39.0–52.0)
Hemoglobin: 12.8 g/dL — ABNORMAL LOW (ref 13.0–17.0)
Lymphocytes Relative: 37.2 % (ref 12.0–46.0)
Lymphs Abs: 1.5 10*3/uL (ref 0.7–4.0)
MCHC: 33.5 g/dL (ref 30.0–36.0)
MCV: 85.5 fl (ref 78.0–100.0)
Monocytes Absolute: 0.4 10*3/uL (ref 0.1–1.0)
Monocytes Relative: 10.3 % (ref 3.0–12.0)
Neutro Abs: 1.9 10*3/uL (ref 1.4–7.7)
Neutrophils Relative %: 48.3 % (ref 43.0–77.0)
Platelets: 171 10*3/uL (ref 150.0–400.0)
RBC: 4.46 Mil/uL (ref 4.22–5.81)
RDW: 14.2 % (ref 11.5–14.6)
WBC: 4 10*3/uL — ABNORMAL LOW (ref 4.5–10.5)

## 2013-05-24 LAB — HEPATIC FUNCTION PANEL
ALT: 17 U/L (ref 0–53)
AST: 22 U/L (ref 0–37)
Albumin: 3.4 g/dL — ABNORMAL LOW (ref 3.5–5.2)
Alkaline Phosphatase: 57 U/L (ref 39–117)
Bilirubin, Direct: 0 mg/dL (ref 0.0–0.3)
Total Bilirubin: 0.4 mg/dL (ref 0.3–1.2)
Total Protein: 7.1 g/dL (ref 6.0–8.3)

## 2013-05-24 LAB — BASIC METABOLIC PANEL
BUN: 16 mg/dL (ref 6–23)
CO2: 32 mEq/L (ref 19–32)
Calcium: 9.1 mg/dL (ref 8.4–10.5)
Chloride: 106 mEq/L (ref 96–112)
Creatinine, Ser: 0.7 mg/dL (ref 0.4–1.5)
GFR: 109.62 mL/min (ref >60.00)
Glucose, Bld: 100 mg/dL — ABNORMAL HIGH (ref 70–99)
Potassium: 4.1 mEq/L (ref 3.5–5.1)
Sodium: 139 mEq/L (ref 135–145)

## 2013-05-24 LAB — C-REACTIVE PROTEIN: CRP: 0.6 mg/dL (ref 0.5–20.0)

## 2013-05-24 LAB — IBC PANEL
Iron: 53 ug/dL (ref 42–165)
Saturation Ratios: 17 % — ABNORMAL LOW (ref 20.0–50.0)
Transferrin: 223.1 mg/dL (ref 212.0–360.0)

## 2013-05-24 LAB — TSH: TSH: 1.76 u[IU]/mL (ref 0.35–5.50)

## 2013-05-24 LAB — FERRITIN: Ferritin: 26.2 ng/mL (ref 22.0–322.0)

## 2013-05-24 LAB — LIPASE: Lipase: 20 U/L (ref 11.0–59.0)

## 2013-05-24 LAB — VITAMIN B12: Vitamin B-12: 264 pg/mL (ref 211–911)

## 2013-05-24 LAB — AMYLASE: Amylase: 62 U/L (ref 27–131)

## 2013-05-24 LAB — FOLATE: Folate: >24.8 ng/mL (ref >5.9)

## 2013-05-24 MED ORDER — DIPHENOXYLATE-ATROPINE 2.5-0.025 MG PO TABS: 1.0000 | ORAL_TABLET | Freq: Four times a day (QID) | ORAL | Status: DC | PRN

## 2013-05-24 MED ORDER — HYDROCODONE-ACETAMINOPHEN 5-325 MG PO TABS: 1.0000 | ORAL_TABLET | Freq: Four times a day (QID) | ORAL | Status: DC | PRN

## 2013-05-24 NOTE — Patient Instructions (Addendum)
Please purchase Benefiber over the counter and take one tablespoon by mouth twice daily.  We have faxed the following medications to your pharmacy for you to pick up at your convenience: Lomotil Hydrocodone   Your physician has requested that you go to the basement for the following lab work before leaving today: BMP CBC TSH CRP Anemia Profile  Amylase Lipase Hepatic Function    You have been scheduled for a CT scan of the abdomen and pelvis at [facility] ([address]---this is in the same building as [facility]ural [facility]).   You are scheduled on 05-26-2013 at 9 AM. You should arrive 15 minutes prior to your appointment time for registration. Please follow the written instructions below on the day of your exam:  WARNING: IF YOU ARE ALLERGIC TO IODINE/X-RAY DYE, PLEASE NOTIFY RADIOLOGY IMMEDIATELY AT [phone]! YOU WILL BE GIVEN A 13 HOUR PREMEDICATION PREP.  1) Do not eat or drink anything after 5 AM (4 hours prior to your test) 2) You have been given 2 bottles of oral contrast to drink. The solution may taste better if refrigerated, but do NOT add ice or any other liquid to this solution. Shake well before drinking.    Drink 1 bottle of contrast @ 7 AM (2 hours prior to your exam)  Drink 1 bottle of contrast @ 8 AM (1 hour prior to your exam)  You may take any medications as prescribed with a small amount of water except for the following: Metformin, Glucophage, Glucovance, Avandamet, Riomet, Fortamet, Actoplus Met, Janumet, Glumetza or Metaglip. The above medications must be held the day of the exam AND 48 hours after the exam.  The purpose of you drinking the oral contrast is to aid in the visualization of your intestinal tract. The contrast solution may cause some diarrhea. Before your exam is started, you will be given a small amount of fluid to drink. Depending on your individual set of symptoms, you may also receive an intravenous injection of x-ray  contrast/dye. Plan on being at [facility] for 30 minutes or long, depending on the type of exam you are having performed.  This test typically takes 30-45 minutes to complete.  If you have any questions regarding your exam or if you need to reschedule, you may call the CT department at [phone] between the hours of 8:00 am and 5:00 pm, Monday-Friday.  ________________________________________________________________________

## 2013-05-24 NOTE — Progress Notes (Signed)
This is a 77 year old Caucasian male with known diverticulosis and IBS.  He now complains of severe recurrent right lower quadrant pain which actually has been a problem for the last 15 years.  No diagnoses is ever been actually determined.  He continues with periodic severe right lower quadrant pain with most likely musculoskeletal components.  He denies bowel or regularity, melena, hematochezia, or any specific hepatobiliary other upper GI problems.  He is seeing Dr. Luretha Murphy in surgery for consultation in the past.  He is up-to-date on his colonoscopy which was done 3 years ago.  Current Medications, Allergies, Past Medical History, Past Surgical History, Family History and Social History were reviewed in [employer] record.  ROS: All systems were reviewed and are negative unless otherwise stated in the HPI.          Physical Exam: Awake and alert no acute distress.  Blood pressure 158/82, pulse 64 and regular and weight 201 with a BMI of 26.55.  Cannot appreciate stigmata of chronic liver disease.  Chest is clear and he is in a regular rhythm without murmurs gallops or rubs.  His abdomen shows a small umbilical hernia with some mild distention and very active bowel sounds.  I cannot appreciate any definite organomegaly, masses, localized tenderness.  Mental status is normal.    Assessment and Plan: This patient's right lower quadrant pain is been an enigma for many years.  It certainly sound like he has intermittent bowel obstruction, consider internal hernia, volvulus, versus mostly functional problems with musculoskeletal pain.  I've ordered labs for review and will obtain followup CT scan of the abdomen and pelvis.  He may need reevaluation by Dr. Luretha Murphy.  Have given a prescription for hydrocodone 5 mg to use for severe pain every 8-12 hours, have advised I high-fiber diet with daily Benefiber and liberal by mouth fluids, and he is to continue other  medications per primary care.  I've suggested possible followup colonoscopy, he is refused. Encounter Diagnosis  Name Primary?  . Irritable bowel syndrome Yes

## 2013-05-26 ENCOUNTER — Ambulatory Visit (INDEPENDENT_AMBULATORY_CARE_PROVIDER_SITE_OTHER)
Admission: RE | Admit: 2013-05-26 | Discharge: 2013-05-26 | Disposition: A | Discharge status: Home / Self Care | Payer: Medicare Other | Source: Ambulatory Visit | Attending: Gastroenterology | Admitting: Gastroenterology

## 2013-05-26 DIAGNOSIS — K589 Irritable bowel syndrome without diarrhea: Secondary | ICD-10-CM

## 2013-05-26 DIAGNOSIS — K5730 Diverticulosis of large intestine without perforation or abscess without bleeding: Secondary | ICD-10-CM

## 2013-05-26 DIAGNOSIS — R1031 Right lower quadrant pain: Secondary | ICD-10-CM

## 2013-05-26 MED ORDER — IOHEXOL 300 MG/ML  SOLN
100.0000 mL | Freq: Once | INTRAMUSCULAR | Status: AC | PRN
  Administered 2013-05-26: 100 mL via INTRAVENOUS

## 2013-05-27 ENCOUNTER — Telehealth: Payer: Self-pay | Admitting: Gastroenterology

## 2013-05-27 NOTE — Telephone Encounter (Signed)
Routed CT report to Dr [name].

## 2013-05-27 NOTE — Telephone Encounter (Signed)
Notes Recorded by Mardella Layman, MD on 05/26/2013 at 10:59 AM Normal exam same medications and no followup needed. Please copy her primary care with his CT results. If he continues to have pain out of suggest referral for reevaluation by Dr. Luretha Murphy in surgery     Notes Recorded by Linna Hoff, RN on 05/26/2013 at 4:37 PM Notes Recorded by Mardella Layman, MD on 05/25/2013 at 8:53 AM B12 po supplements     Informed pt of CT results and if he continues to have pain, Dr Jarold Motto will refer him back to Dr Daphine Deutscher; he stated understanding. Also, he needs to continue B12 tabs. Pt states he feels worse since he went on the po B12 and wonders why we don't have the injections. Pt thinks there is a pharmacy in [place] that has plenty of the vials; he will have [place] or Dr [name]' ofc give the injections.

## 2013-05-30 ENCOUNTER — Telehealth: Payer: Self-pay | Admitting: Gastroenterology

## 2013-05-30 NOTE — Telephone Encounter (Signed)
Gave pt Dr Norval Gable instructions and in between our call, he got an appt with Dr [name] on 06/03/13. He stated understanding.

## 2013-05-30 NOTE — Telephone Encounter (Signed)
This is [place] pain.[name] Kitcheniprimary care or pain clinic matter.he does not have GI PAIN and no more narcotics !!!!!!

## 2013-05-30 NOTE — Telephone Encounter (Signed)
Pt reports all night long he had increased pain in his upper right side that radiates all the way down into his lower abdomen. He was nauseated all night as as well and hasn't slept. He called Dr Ermalene Searing ofc and cannot get an appt until late 07-21-2023. The nausea has passed this am and he denies fever or diarrhea. Pt still wondering if it's his gallbladder. Pt states Dr Ermalene Searing ofc said to call us. Please advise. Thanks.

## 2013-06-03 ENCOUNTER — Encounter: Payer: Self-pay | Admitting: Internal Medicine

## 2013-06-03 ENCOUNTER — Ambulatory Visit (INDEPENDENT_AMBULATORY_CARE_PROVIDER_SITE_OTHER): Payer: Self-pay | Admitting: Internal Medicine

## 2013-06-03 VITALS — BP 140/80 | HR 76 | Temp 98.6°F | Resp 16 | Ht 73.0 in | Wt 198.0 lb

## 2013-06-03 DIAGNOSIS — D519 Vitamin B12 deficiency anemia, unspecified: Secondary | ICD-10-CM

## 2013-06-03 DIAGNOSIS — D518 Other vitamin B12 deficiency anemias: Secondary | ICD-10-CM

## 2013-06-03 DIAGNOSIS — K4090 Unilateral inguinal hernia, without obstruction or gangrene, not specified as recurrent: Secondary | ICD-10-CM

## 2013-06-03 MED ORDER — CYANOCOBALAMIN 1000 MCG/ML IJ SOLN: 1000.0000 ug | INTRAMUSCULAR | Status: DC

## 2013-06-03 MED ORDER — CYANOCOBALAMIN 1000 MCG/ML IJ SOLN: INTRAMUSCULAR | Status: DC

## 2013-06-03 NOTE — Addendum Note (Signed)
Addended by: Willy Eddy on: 06/03/2013 12:55 PM   Modules accepted: Orders

## 2013-06-03 NOTE — Progress Notes (Signed)
  Subjective:    Patient ID: Timothy Gallagher, male    DOB: [birth year], 77 y.o.   MRN: [id number]  HPI The patient is a 77-year-old male who presents with persistent right lower abdominal pain he's been completely evaluated by gastroenterology and there is no gastroenterology etiology of his pain a CT scan of his abdomen and pelvis showed no evidence of disease  On physical examination he has some tenderness at the point of prior inguinal hernia surgery and mesh placement and is exquisitely tender point tenderness over the inguinal canal.     Review of Systems  Constitutional: Negative for fever and fatigue.  HENT: Negative for hearing loss, congestion, neck pain and postnasal drip.   Eyes: Negative for discharge, redness and visual disturbance.  Respiratory: Negative for cough, shortness of breath and wheezing.   Cardiovascular: Negative for leg swelling.  Gastrointestinal: Negative for abdominal pain, constipation and abdominal distention.  Genitourinary: Negative for urgency and frequency.  Musculoskeletal: Negative for joint swelling and arthralgias.  Skin: Negative for color change and rash.  Neurological: Negative for weakness and light-headedness.  Hematological: Negative for adenopathy.  Psychiatric/Behavioral: Negative for behavioral problems.       Objective:   Physical Exam  Constitutional: He appears well-developed and well-nourished.  HENT:  Head: Normocephalic and atraumatic.  Eyes: Conjunctivae are normal. Pupils are equal, round, and reactive to light.  Neck: Normal range of motion. Neck supple.  Cardiovascular: Normal rate and regular rhythm.   Pulmonary/Chest: Effort normal and breath sounds normal.  Abdominal: Soft. Bowel sounds are normal.   Tenderness over the right inguinal canal       Assessment & Plan:  Patient has a palpable tenderness over the right inguinal canal and there is apparent scar tissue from placement of the mesh I believe that he has some  mild tearing of the mesh and some recurrence of his inguinal hernia it is slight at this time and there is simply tenderness with lifting I would recommend that he be careful with lifting lose weight to reduce risks and if it persists or worsens we would have him evaluated by his. surgeon

## 2013-06-06 ENCOUNTER — Telehealth: Payer: Self-pay | Admitting: Internal Medicine

## 2013-06-06 NOTE — Telephone Encounter (Signed)
PT states that he has his own b-12 serum. He would like to know how much we will charge for the injection only. Please assist.

## 2013-06-13 ENCOUNTER — Other Ambulatory Visit: Payer: Medicare Other

## 2013-06-20 ENCOUNTER — Encounter: Payer: Medicare Other | Admitting: Internal Medicine

## 2013-06-21 ENCOUNTER — Other Ambulatory Visit: Payer: Self-pay | Admitting: Dermatology

## 2013-06-22 ENCOUNTER — Telehealth: Payer: Self-pay | Admitting: Internal Medicine

## 2013-06-22 DIAGNOSIS — K4090 Unilateral inguinal hernia, without obstruction or gangrene, not specified as recurrent: Secondary | ICD-10-CM

## 2013-06-22 NOTE — Telephone Encounter (Signed)
Referral sent to [facility] for dr Daphine Deutscher for rt in direct inguinal hernia

## 2013-06-22 NOTE — Telephone Encounter (Signed)
Pt states Dr [name] advised him if his hernia was not better to call back and he would set something up w/ Dr Daphine Deutscher. Pt would like to do this, no better.

## 2013-06-24 ENCOUNTER — Ambulatory Visit (INDEPENDENT_AMBULATORY_CARE_PROVIDER_SITE_OTHER): Payer: Medicare Other | Admitting: Internal Medicine

## 2013-06-24 DIAGNOSIS — E538 Deficiency of other specified B group vitamins: Secondary | ICD-10-CM

## 2013-06-24 MED ORDER — CYANOCOBALAMIN 1000 MCG/ML IJ SOLN
1000.0000 ug | Freq: Once | INTRAMUSCULAR | Status: AC
  Administered 2013-06-24: 1000 ug via INTRAMUSCULAR

## 2013-07-01 ENCOUNTER — Telehealth: Payer: Self-pay | Admitting: Internal Medicine

## 2013-07-01 NOTE — Telephone Encounter (Signed)
Tim Lair, I dont exactly know what I am suppose to do with this,

## 2013-07-01 NOTE — Telephone Encounter (Signed)
As patient is obtaining his injections in the office, this is not a prior auth issue. It will be a dx, billing, coding issue. I sent it to [facility] as an [place].

## 2013-07-01 NOTE — Telephone Encounter (Signed)
Pt states [employer] will not pay for his b12 injections unless it is medically neccessary. Pt states he is anemic and takes a stronger dose than normal. Pt must take every 3 weeks.  At first they told him they would pay 80% of everything. Pt states they told him to have dr's office to call.  [phone]  pls advise

## 2013-07-14 ENCOUNTER — Ambulatory Visit (INDEPENDENT_AMBULATORY_CARE_PROVIDER_SITE_OTHER): Payer: Medicare Other | Admitting: Surgery

## 2013-07-14 ENCOUNTER — Encounter (INDEPENDENT_AMBULATORY_CARE_PROVIDER_SITE_OTHER): Payer: Self-pay | Admitting: Surgery

## 2013-07-14 VITALS — BP 138/90 | HR 56 | Temp 98.4°F | Resp 14 | Ht 73.0 in | Wt 196.4 lb

## 2013-07-14 DIAGNOSIS — R1031 Right lower quadrant pain: Secondary | ICD-10-CM

## 2013-07-14 NOTE — Progress Notes (Signed)
Chief Complaint:  Right inguinal pain  History of Present Illness:  Timothy Gallagher is an 77 y.o. male on M. I did a right inguinal hernia repair in February of 1996. He has done very well from that. He had a right direct inguinal hernia that I repaired with mesh sutured in place with Prolene. I examined him and I was unable to palpate any kind of pulsion change or mass that would lead me to feel that there is a significant recurrent hernia present. His testes are normal and without masses.  I reviewed his CT scan obtained with a diagnosis of left lower quadrant pain and no mention of a hernia was made. I reviewed the scan myself with him and did not see a recurrent hernia. He has had quite a bit of back surgery and there is some asymmetry of his abdominal wall suggesting weakness on the right side.    Past Medical History  Diagnosis Date  . ACNE ROSACEA 06/27/2009  . ACTINIC KERATOSIS, HEAD 04/18/2009  . Acute maxillary sinusitis 05/14/2010  . ALLERGIC RHINITIS 04/09/2007  . B12 DEFICIENCY 06/07/2007  . BACK PAIN WITH RADICULOPATHY 04/24/2008  . CHEST WALL PAIN, ACUTE 06/15/2009  . Chronic maxillary sinusitis 05/29/2008  . COLITIS 04/27/2009  . COLONIC POLYPS, HX OF 04/27/2009    tubular adenomas  . DERMATITIS, ATOPIC 10/12/2007  . DIVERTICULOSIS, COLON 04/27/2009  . ECCHYMOSES, SPONTANEOUS 06/27/2008  . Elevated sedimentation rate 05/02/2009  . ESOPHAGEAL STRICTURE 04/27/2009  . GASTRITIS, CHRONIC 04/27/2009  . HYPERLIPIDEMIA 03/13/2008  . HYPERTENSION 04/09/2007  . Irritable bowel syndrome 08/11/2007  . KIDNEY DISEASE 04/09/2007  . NECK PAIN 09/14/2008  . NEUROPATHY, IDIOPATHIC PERIPHERAL NEC 08/11/2007  . OSTEOARTHRITIS, WRIST, RIGHT 08/05/2010  . Cancer     skin    Past Surgical History  Procedure Laterality Date  . Hernia repair    . Lamenectomy    . Lumbar fusion    . Tonsillectomy      Current Outpatient Prescriptions  Medication Sig Dispense Refill  . acetaminophen (TYLENOL) 500 MG  tablet Take 500 mg by mouth every 6 (six) hours as needed.        . cyanocobalamin (,VITAMIN B-12,) 1000 MCG/ML injection Inject 1.35ml every 3 weeks  10 mL  3  . diphenoxylate-atropine (LOMOTIL) 2.5-0.025 MG per tablet Take 1 tablet by mouth 4 (four) times daily as needed.  60 tablet  4  . fexofenadine (ALLEGRA) 30 MG tablet Take 30 mg by mouth 2 (two) times daily.      . fish oil-omega-3 fatty acids 1000 MG capsule Take 2 g by mouth daily.        . fluticasone (FLONASE) 50 MCG/ACT nasal spray Place 2 sprays into the nose daily.  16 g  6  . hydrocortisone 0.5 % ointment Apply to rectal area 3 times daily as needed for hemorrhoids.  30 g  1  . indapamide (LOZOL) 1.25 MG tablet Take 1.25 mg by mouth daily.       [name] Kitchen losartan (COZAAR) 50 MG tablet Take 50 mg by mouth daily.      . meclizine (ANTIVERT) 25 MG tablet Take 1 tablet (25 mg total) by mouth every 4 (four) hours as needed for dizziness.  60 tablet  2  . Multiple Vitamin (MULTIVITAMIN) capsule Take 1 capsule by mouth daily.        . nadolol (CORGARD) 20 MG tablet 1/2 tab twice daily      . Red Yeast Rice 600 MG CAPS  Take 1 capsule by mouth 2 (two) times daily.       . temazepam (RESTORIL) 15 MG capsule Take 1 capsule (15 mg total) by mouth at bedtime as needed for sleep.  30 capsule  5  . traMADol (ULTRAM) 50 MG tablet Take 1 tablet (50 mg total) by mouth every 6 (six) hours as needed for pain.  60 tablet  3  . triamcinolone (NASACORT) 55 MCG/ACT nasal inhaler Place 2 sprays into the nose daily.       No current facility-administered medications for this visit.   Amoxicillin; Atorvastatin; Ciprofloxacin; Mycophenolate mofetil; and Rosuvastatin Family History  Problem Relation Age of Onset  . Hernia Mother   . Heart disease Father   . COPD Father   . Colon cancer Neg Hx    Social History:   reports that he quit smoking about 11 years ago. He has never used smokeless tobacco. He reports that he does not drink alcohol or use illicit  drugs.   REVIEW OF SYSTEMS - PERTINENT POSITIVES ONLY: Positive for hearing loss, nasal congestion, visual disturbance, diarrhea and nausea. Otherwise her review of systems is negative. I reviewed his family history and smoking history and they don't really play into the present illness.    Physical Exam:   Blood pressure 138/90, pulse 56, temperature 98.4 F (36.9 C), temperature source Temporal, resp. rate 14, height 6\' 1"  (1.854 m), weight 196 lb 6.4 oz (89.086 kg). Body mass index is 25.92 kg/(m^2).  Gen:  WDWN white male NAD  Neurological: Alert and oriented to person, place, and time. Motor and sensory function is grossly intact  Head: Normocephalic and atraumatic.  Eyes: Conjunctivae are normal. Pupils are equal, round, and reactive to light. No scleral icterus.  Neck: Normal range of motion. Neck supple. No tracheal deviation or thyromegaly present.  Cardiovascular:  SR without murmurs or gallops.  No carotid bruits Respiratory: Effort normal.  No respiratory distress. No chest wall tenderness. Breath sounds normal.  No wheezes, rales or rhonchi.  Abdomen:  No palpable bulge in the right inguinal region. GU: Musculoskeletal: Normal range of motion. Extremities are nontender. No cyanosis, edema or clubbing noted Lymphadenopathy: No cervical, preauricular, postauricular or axillary adenopathy is present Skin: Skin is warm and dry. No rash noted. No diaphoresis. No erythema. No pallor. Pscyh: Normal mood and affect. Behavior is normal. Judgment and thought content normal.   LABORATORY RESULTS: No results found for this or any previous visit (from the past 48 hour(s)).  RADIOLOGY RESULTS: No results found.  Problem List: Patient Active Problem List   Diagnosis Date Noted  . Palpitations 02/11/2013  . Rectal or anal pain 09/26/2011  . Proteinuria 05/05/2011  . OSTEOARTHRITIS, WRIST, RIGHT 08/05/2010  . ACNE ROSACEA 06/27/2009  . ESOPHAGEAL STRICTURE 04/27/2009  .  GASTRITIS, CHRONIC 04/27/2009  . COLITIS 04/27/2009  . DIVERTICULOSIS, COLON 04/27/2009  . COLONIC POLYPS, HX OF 04/27/2009  . ACTINIC KERATOSIS, HEAD 04/18/2009  . NECK PAIN 09/14/2008  . HYPERLIPIDEMIA 03/13/2008  . NEUROPATHY, IDIOPATHIC PERIPHERAL NEC 08/11/2007  . IRRITABLE BOWEL SYNDROME 08/11/2007  . B12 DEFICIENCY 06/07/2007  . HYPERTENSION 04/09/2007  . ALLERGIC RHINITIS 04/09/2007    Assessment & Plan: I have encouraged Timothy Gallagher to continue to be active and to take ibuprofen for any pain that he has. Although I was unable to feel a bulge and I did not see anything on a CT scan I will certainly believe Dr. [name] findings. I would like to see Timothy Gallagher back  in 2 months and reassess him and see if anything is becoming more apparent.    Matt B. Daphine Deutscher, MD, [facility] [phone] beeper [phone]  07/14/2013 10:47 AM

## 2013-07-14 NOTE — Patient Instructions (Signed)
Continue current level of activity Take ibuprofen for pain

## 2013-07-15 ENCOUNTER — Ambulatory Visit (INDEPENDENT_AMBULATORY_CARE_PROVIDER_SITE_OTHER): Payer: Medicare Other | Admitting: Internal Medicine

## 2013-07-15 DIAGNOSIS — Z23 Encounter for immunization: Secondary | ICD-10-CM

## 2013-07-15 DIAGNOSIS — E538 Deficiency of other specified B group vitamins: Secondary | ICD-10-CM

## 2013-07-15 MED ORDER — CYANOCOBALAMIN 1000 MCG/ML IJ SOLN
1000.0000 ug | Freq: Once | INTRAMUSCULAR | Status: AC
  Administered 2013-07-15: 1000 ug via INTRAMUSCULAR

## 2013-07-20 NOTE — Telephone Encounter (Signed)
Returned call to patient after speaking with representative at [facility] who states patient's plan does cover B-12 injections however not at 100%.  Patient will be responsible for any applicable deductible or coinsurance.  Patient states he understood but wanted to know if he could be reimbursed for the b-12 medication he purchased at the pharmacy.  I informed pt he needs to contact prescription plan carrier to inquire about that.

## 2013-08-03 ENCOUNTER — Other Ambulatory Visit: Payer: Self-pay | Admitting: Internal Medicine

## 2013-08-05 ENCOUNTER — Ambulatory Visit (INDEPENDENT_AMBULATORY_CARE_PROVIDER_SITE_OTHER): Payer: Medicare Other | Admitting: Internal Medicine

## 2013-08-05 DIAGNOSIS — D519 Vitamin B12 deficiency anemia, unspecified: Secondary | ICD-10-CM

## 2013-08-05 DIAGNOSIS — Z23 Encounter for immunization: Secondary | ICD-10-CM

## 2013-08-05 DIAGNOSIS — D518 Other vitamin B12 deficiency anemias: Secondary | ICD-10-CM

## 2013-08-05 MED ORDER — CYANOCOBALAMIN 1000 MCG/ML IJ SOLN: 1000.0000 ug | INTRAMUSCULAR | Status: DC

## 2013-08-05 MED ORDER — CYANOCOBALAMIN 1000 MCG/ML IJ SOLN
1000.0000 ug | INTRAMUSCULAR | Status: DC
  Administered 2013-08-05: 1000 ug via INTRAMUSCULAR

## 2013-08-29 ENCOUNTER — Other Ambulatory Visit (INDEPENDENT_AMBULATORY_CARE_PROVIDER_SITE_OTHER): Payer: Medicare Other

## 2013-08-29 ENCOUNTER — Ambulatory Visit (INDEPENDENT_AMBULATORY_CARE_PROVIDER_SITE_OTHER): Payer: Medicare Other | Admitting: Internal Medicine

## 2013-08-29 DIAGNOSIS — D519 Vitamin B12 deficiency anemia, unspecified: Secondary | ICD-10-CM

## 2013-08-29 DIAGNOSIS — D518 Other vitamin B12 deficiency anemias: Secondary | ICD-10-CM

## 2013-08-29 DIAGNOSIS — Z0000 Encounter for general adult medical examination without abnormal findings: Secondary | ICD-10-CM

## 2013-08-29 DIAGNOSIS — I10 Essential (primary) hypertension: Secondary | ICD-10-CM

## 2013-08-29 LAB — CBC WITH DIFFERENTIAL/PLATELET
Basophils Absolute: 0 10*3/uL (ref 0.0–0.1)
Basophils Relative: 0.4 % (ref 0.0–3.0)
Eosinophils Absolute: 0.2 10*3/uL (ref 0.0–0.7)
Eosinophils Relative: 4.4 % (ref 0.0–5.0)
HCT: 40.6 % (ref 39.0–52.0)
Hemoglobin: 13.5 g/dL (ref 13.0–17.0)
Lymphocytes Relative: 33.9 % (ref 12.0–46.0)
Lymphs Abs: 1.7 10*3/uL (ref 0.7–4.0)
MCHC: 33.4 g/dL (ref 30.0–36.0)
MCV: 85.8 fl (ref 78.0–100.0)
Monocytes Absolute: 0.4 10*3/uL (ref 0.1–1.0)
Monocytes Relative: 9.1 % (ref 3.0–12.0)
Neutro Abs: 2.6 10*3/uL (ref 1.4–7.7)
Neutrophils Relative %: 52.2 % (ref 43.0–77.0)
Platelets: 154 10*3/uL (ref 150.0–400.0)
RBC: 4.73 Mil/uL (ref 4.22–5.81)
RDW: 14.5 % (ref 11.5–14.6)
WBC: 4.9 10*3/uL (ref 4.5–10.5)

## 2013-08-29 LAB — BASIC METABOLIC PANEL
BUN: 18 mg/dL (ref 6–23)
CO2: 27 mEq/L (ref 19–32)
Calcium: 9.6 mg/dL (ref 8.4–10.5)
Chloride: 109 mEq/L (ref 96–112)
Creatinine, Ser: 0.7 mg/dL (ref 0.4–1.5)
GFR: 111.28 mL/min (ref >60.00)
Glucose, Bld: 104 mg/dL — ABNORMAL HIGH (ref 70–99)
Potassium: 5.3 mEq/L — ABNORMAL HIGH (ref 3.5–5.1)
Sodium: 144 mEq/L (ref 135–145)

## 2013-08-29 LAB — LIPID PANEL
Cholesterol: 221 mg/dL — ABNORMAL HIGH (ref 0–200)
HDL: 42.2 mg/dL (ref >39.00)
Total CHOL/HDL Ratio: 5
Triglycerides: 158 mg/dL — ABNORMAL HIGH (ref 0.0–149.0)
VLDL: 31.6 mg/dL (ref 0.0–40.0)

## 2013-08-29 LAB — HEPATIC FUNCTION PANEL
ALT: 15 U/L (ref 0–53)
AST: 21 U/L (ref 0–37)
Albumin: 3.7 g/dL (ref 3.5–5.2)
Alkaline Phosphatase: 60 U/L (ref 39–117)
Bilirubin, Direct: 0.1 mg/dL (ref 0.0–0.3)
Total Bilirubin: 0.7 mg/dL (ref 0.3–1.2)
Total Protein: 7.3 g/dL (ref 6.0–8.3)

## 2013-08-29 LAB — POCT URINALYSIS DIPSTICK
Bilirubin, UA: NEGATIVE
Glucose, UA: NEGATIVE
Ketones, UA: NEGATIVE
Leukocytes, UA: NEGATIVE
Nitrite, UA: NEGATIVE
Spec Grav, UA: 1.025
Urobilinogen, UA: 0.2
pH, UA: 5

## 2013-08-29 LAB — LDL CHOLESTEROL, DIRECT: Direct LDL: 143.2 mg/dL

## 2013-08-29 LAB — TSH: TSH: 2.26 u[IU]/mL (ref 0.35–5.50)

## 2013-08-29 LAB — PSA: PSA: 3.52 ng/mL (ref 0.10–4.00)

## 2013-08-29 MED ORDER — CYANOCOBALAMIN 1000 MCG/ML IJ SOLN
1000.0000 ug | INTRAMUSCULAR | Status: AC
  Administered 2013-08-29: 1000 ug via INTRAMUSCULAR

## 2013-09-05 LAB — PSA: PSA: 3.18

## 2013-09-06 ENCOUNTER — Telehealth: Payer: Self-pay | Admitting: Internal Medicine

## 2013-09-06 NOTE — Telephone Encounter (Signed)
Patient Information:  Caller Name: Oskar  Phone: [phone]  Patient: Timothy Gallagher, Timothy Gallagher  Gender: Male  DOB: [birth year]  Age: 77 Years  PCP: Darryll Capers (Adults only)  Office Follow Up:  Does the office need to follow up with this patient?: No  Instructions For The Office: N/A  RN Note:  Patient states that he was seen by the kidney specialist and blood pressure was 199/90 and when he checked blood pressure today and was 165/92 at 0730 and had already taken his blood pressure medications 30 minutes prior to.  Recheck at 0914 was 170/96.  Patient already has appointment with Dr. [name] 09/07/13 for evaluation.  Symptoms  Reason For Call & Symptoms: High blood pressure  Reviewed Health History In EMR: Yes  Reviewed Medications In EMR: Yes  Reviewed Allergies In EMR: Yes  Reviewed Surgeries / Procedures: Yes  Date of Onset of Symptoms: 09/05/2013  Guideline(s) Used:  High Blood Pressure  Disposition Per Guideline:   See Within 2 Weeks in Office  Reason For Disposition Reached:   BP > 140/90 and is taking BP medications  Advice Given:  Lifestyle Changes  Eat a diet high in fresh fruits and low-fat dairy products. Limit your intake of saturated and total fat. Choose foods that are lower in salt.  Call Back If:  Headache, blurred vision, difficulty talking, or difficulty walking occurs  Chest pain or difficulty breathing occurs  You become worse.  Limit:  Limit your sugar intake. Avoid high-calorie, low-nutrition foods like soft drinks and candy.  Patient Will Follow Care Advice:  YES

## 2013-09-06 NOTE — Telephone Encounter (Signed)
Continue to monitor - if sx go to ed

## 2013-09-07 ENCOUNTER — Encounter: Payer: Self-pay | Admitting: Internal Medicine

## 2013-09-07 ENCOUNTER — Ambulatory Visit (INDEPENDENT_AMBULATORY_CARE_PROVIDER_SITE_OTHER): Payer: Medicare Other | Admitting: Internal Medicine

## 2013-09-07 VITALS — BP 172/90 | HR 72 | Temp 98.2°F | Resp 16 | Ht 73.0 in | Wt 198.0 lb

## 2013-09-07 DIAGNOSIS — Z0000 Encounter for general adult medical examination without abnormal findings: Secondary | ICD-10-CM

## 2013-09-07 DIAGNOSIS — I10 Essential (primary) hypertension: Secondary | ICD-10-CM

## 2013-09-07 MED ORDER — TELMISARTAN 80 MG PO TABS: 80.0000 mg | ORAL_TABLET | Freq: Every day | ORAL | Status: DC

## 2013-09-07 NOTE — Progress Notes (Signed)
Subjective:    Patient ID: Timothy Gallagher, male    DOB: 24-Jul-1936, 77 y.o.   MRN: [id number]  HPI Is a 77 year old male presents for an annual exam.  His blood pressure taken at his urologist on Monday and it was elevated he continues to be elevated he is on Cozaar Lozol and nadolol.  He also has chronic allergic rhinitis.  And chronic prostatitis.  It was also noted in screening blood work that his lipids were elevated with a marked elevation of his LDL   Review of Systems  Unable to perform ROS Constitutional: Negative for fever and fatigue.  HENT: Negative for congestion, hearing loss and postnasal drip.   Eyes: Negative for discharge, redness and visual disturbance.  Respiratory: Negative for cough, shortness of breath and wheezing.   Cardiovascular: Negative for leg swelling.  Gastrointestinal: Negative for abdominal pain, constipation and abdominal distention.  Genitourinary: Negative for urgency and frequency.  Musculoskeletal: Negative for arthralgias, joint swelling and neck pain.  Skin: Negative for color change and rash.  Neurological: Negative for weakness and light-headedness.  Hematological: Negative for adenopathy.  Psychiatric/Behavioral: Negative for behavioral problems.  All other systems reviewed and are negative.   Past Medical History  Diagnosis Date  . ACNE ROSACEA 06/27/2009  . ACTINIC KERATOSIS, HEAD 04/18/2009  . Acute maxillary sinusitis 05/14/2010  . ALLERGIC RHINITIS 04/09/2007  . B12 DEFICIENCY 06/07/2007  . BACK PAIN WITH RADICULOPATHY 04/24/2008  . CHEST WALL PAIN, ACUTE 06/15/2009  . Chronic maxillary sinusitis 05/29/2008  . COLITIS 04/27/2009  . COLONIC POLYPS, HX OF 04/27/2009    tubular adenomas  . DERMATITIS, ATOPIC 10/12/2007  . DIVERTICULOSIS, COLON 04/27/2009  . ECCHYMOSES, SPONTANEOUS 06/27/2008  . Elevated sedimentation rate 05/02/2009  . ESOPHAGEAL STRICTURE 04/27/2009  . GASTRITIS, CHRONIC 04/27/2009  . HYPERLIPIDEMIA 03/13/2008  .  HYPERTENSION 04/09/2007  . Irritable bowel syndrome 08/11/2007  . KIDNEY DISEASE 04/09/2007  . NECK PAIN 09/14/2008  . NEUROPATHY, IDIOPATHIC PERIPHERAL NEC 08/11/2007  . OSTEOARTHRITIS, WRIST, RIGHT 08/05/2010  . Cancer     skin    History   Social History  . Marital Status: Married    Spouse Name: N/A    Number of Children: 4  . Years of Education: N/A   Occupational History  . retired     from [employer]   Social History Main Topics  . Smoking status: Former Smoker    Quit date: 11/17/2001  . Smokeless tobacco: Never Used  . Alcohol Use: No  . Drug Use: No  . Sexual Activity: Yes   Other Topics Concern  . Not on file   Social History Narrative  . No narrative on file    Past Surgical History  Procedure Laterality Date  . Hernia repair    . Lamenectomy    . Lumbar fusion    . Tonsillectomy      Family History  Problem Relation Age of Onset  . Hernia Mother   . Heart disease Father   . COPD Father   . Colon cancer Neg Hx     Allergies  Allergen Reactions  . Amoxicillin     REACTION: unspecified  . Atorvastatin     REACTION: nausea and blurred vision  . Ciprofloxacin     REACTION: unspecified  . Mycophenolate Mofetil     REACTION: unspecified  . Rosuvastatin     Current Outpatient Prescriptions on File Prior to Visit  Medication Sig Dispense Refill  . acetaminophen (TYLENOL) 500 MG tablet Take 500 mg  by mouth every 6 (six) hours as needed.        . cyanocobalamin (,VITAMIN B-12,) 1000 MCG/ML injection Inject 1.84ml every 3 weeks  10 mL  3  . diphenoxylate-atropine (LOMOTIL) 2.5-0.025 MG per tablet Take 1 tablet by mouth 4 (four) times daily as needed.  60 tablet  4  . fexofenadine (ALLEGRA) 30 MG tablet Take 30 mg by mouth 2 (two) times daily.      . fish oil-omega-3 fatty acids 1000 MG capsule Take 2 g by mouth daily.        . fluticasone (FLONASE) 50 MCG/ACT nasal spray Place 2 sprays into the nose daily.  16 g  6  . hydrocortisone 0.5 % ointment  Apply to rectal area 3 times daily as needed for hemorrhoids.  30 g  1  . indapamide (LOZOL) 1.25 MG tablet Take 1.25 mg by mouth daily.       . meclizine (ANTIVERT) 25 MG tablet Take 1 tablet (25 mg total) by mouth every 4 (four) hours as needed for dizziness.  60 tablet  2  . Multiple Vitamin (MULTIVITAMIN) capsule Take 1 capsule by mouth daily.        . nadolol (CORGARD) 20 MG tablet 1/2 tab twice daily      . Red Yeast Rice 600 MG CAPS Take 1 capsule by mouth 2 (two) times daily.       . temazepam (RESTORIL) 15 MG capsule TAKE ONE CAPSULE BY MOUTH AT BEDTIME AS NEEDED FOR SLEEP  30 capsule  5  . traMADol (ULTRAM) 50 MG tablet Take 1 tablet (50 mg total) by mouth every 6 (six) hours as needed for pain.  60 tablet  3  . triamcinolone (NASACORT) 55 MCG/ACT nasal inhaler Place 2 sprays into the nose daily.       No current facility-administered medications on file prior to visit.    BP 172/90  Pulse 72  Temp(Src) 98.2 F (36.8 C)  Resp 16  Ht 6\' 1"  (1.854 m)  Wt 198 lb (89.812 kg)  BMI 26.13 kg/m2        Objective:   Physical Exam  Nursing note and vitals reviewed. Constitutional: He is oriented to person, place, and time. He appears well-developed and well-nourished.  HENT:  Head: Normocephalic and atraumatic.  Eyes: Conjunctivae are normal. Pupils are equal, round, and reactive to light.  Cardiovascular: Normal rate and regular rhythm.   Murmur heard. Musculoskeletal: Normal range of motion.  Neurological: He is alert and oriented to person, place, and time.  Skin: Skin is warm.          Assessment & Plan:   Patient presents for yearly preventative medicine examination.   all immunizations and health maintenance protocols were reviewed with the patient and they are up to date with these protocols.   screening laboratory values were reviewed with the patient including screening of hyperlipidemia PSA renal function and hepatic function.   There medications past  medical history social history problem list and allergies were reviewed in detail.   Goals were established with regard to weight loss exercise diet in compliance with medications   Change to Cozaar to Micardis 80 and monitor blood pressure.  Patient is intolerant of statins Recent has admitted that his diet is rich in fats and sugars will modify diet

## 2013-09-09 ENCOUNTER — Telehealth: Payer: Self-pay | Admitting: Internal Medicine

## 2013-09-09 NOTE — Telephone Encounter (Signed)
Pt informed- not that low- states he doesn't need tamsolosin.[name] Kitchenwas told to call urologist that ordered it

## 2013-09-09 NOTE — Telephone Encounter (Signed)
Patient Information:  Caller Name: Timothy Gallagher  Phone: [phone]  Patient: Timothy Gallagher, Timothy Gallagher  Gender: Male  DOB: [birth year]  Age: 77 Years  PCP: Darryll Capers (Adults only)  Office Follow Up:  Does the office need to follow up with this patient?: Yes  Instructions For The Office: Started Micardis on 10/22 and Tamsulosin on 10/24. BP 105/59 @ 13:30   Symptoms  Reason For Call & Symptoms: Timothy Gallagher states he was started on Micardis 80 mg daily on 10/22. Had episode of weakness while doing yard work today 10 /24 . BP 105/59 @ 13:30.  BP 145/66 @ 14:40. Started Tamsulosin ordered by renal  provider. Concerned about medication causing low BP. Per Weakness protocol has see today in office due to taking a medication that could cause weakness. Please call Timothy Gallagher at [phone].  Reviewed Health History In EMR: Yes  Reviewed Medications In EMR: Yes  Reviewed Allergies In EMR: Yes  Reviewed Surgeries / Procedures: Yes  Date of Onset of Symptoms: 09/09/2013  Guideline(s) Used:  Weakness (Generalized) and Fatigue  Disposition Per Guideline:   Go to Office Now  Reason For Disposition Reached:   Started Medications that could cause weakness  Advice Given:  N/A  Patient Refused Recommendation:  Patient Refused Care Advice  Requesting callback regarding medications

## 2013-09-12 ENCOUNTER — Ambulatory Visit: Payer: Self-pay | Admitting: Family Medicine

## 2013-09-12 ENCOUNTER — Encounter: Payer: Self-pay | Admitting: Internal Medicine

## 2013-09-12 ENCOUNTER — Telehealth: Payer: Self-pay | Admitting: Internal Medicine

## 2013-09-12 ENCOUNTER — Ambulatory Visit (INDEPENDENT_AMBULATORY_CARE_PROVIDER_SITE_OTHER): Payer: Medicare Other | Admitting: Internal Medicine

## 2013-09-12 VITALS — BP 138/80 | HR 68 | Temp 98.1°F | Resp 16 | Ht 73.0 in | Wt 199.0 lb

## 2013-09-12 DIAGNOSIS — J329 Chronic sinusitis, unspecified: Secondary | ICD-10-CM

## 2013-09-12 MED ORDER — METHYLPREDNISOLONE (PAK) 4 MG PO TABS: ORAL_TABLET | ORAL | Status: DC

## 2013-09-12 MED ORDER — AZITHROMYCIN 250 MG PO TABS: ORAL_TABLET | ORAL | Status: DC

## 2013-09-12 NOTE — Telephone Encounter (Signed)
Patient Information:  Caller Name: Nathaniel  Phone: [phone]  Patient: Timothy Gallagher, Timothy Gallagher  Gender: Male  DOB: 1936/06/04  Age: 77 Years  PCP: Darryll Capers (Adults only)  Office Follow Up:  Does the office need to follow up with this patient?: No  Instructions For The Office: N/A  RN Note:  Patient states he was seen in the office 09/07/13. States his blood pressure was elevated and he was prescribed Micardis. Patient states he developed a frontal headache, over one eye, 09/11/13. States blood pressure remains elevated. Blood pressure 177/99 at 0525 09/12/13. Blood Pressure 165/81 at 0810 09/12/13. Patient denies nausea or visual disturbances. Denies numbness or tingling. Denies chest pain or shortness of breath. Patient states he had an episode of dizziness 09/11/13 when lying down in bed. States dizziness lasted approx. 1 minute and resolved spontaneously. Patient denies headache 09/12/13. Patient also states he has had sinus congestion X 3-4 weeks. States he can feel sinus drainage "going down my throat." Denies sinus pain or pressure. Patient is requesting an appt. Care advice given per guidelines. Call back parameters reviewed. Patient verbalizes understanding.  Symptoms  Reason For Call & Symptoms: Headache, elevated blood pressure  Reviewed Health History In EMR: Yes  Reviewed Medications In EMR: Yes  Reviewed Allergies In EMR: Yes  Reviewed Surgeries / Procedures: Yes  Date of Onset of Symptoms: 09/11/2013  Guideline(s) Used:  High Blood Pressure  Headache  Sinus Pain and Congestion  Disposition Per Guideline:   See Today or Tomorrow in Office  Reason For Disposition Reached:   Sinus congestion (pressure, fullness) present > 10 days  Advice Given:  Call Back If:  Headache, blurred vision, difficulty talking, or difficulty walking occurs  Chest pain or difficulty breathing occurs  You become worse.  Pain Medicines:  For pain relief, you can take either acetaminophen,  ibuprofen, or naproxen.  Call Back If:  Headache lasts longer than 24 hours  You become worse.  For a Runny Nose With Profuse Discharge:  Nasal mucus and discharge helps to wash viruses and bacteria out of the nose and sinuses.  For a Stuffy Nose - Use Nasal Washes:  Introduction: Saline (salt water) nasal irrigation (nasal wash) is an effective and simple home remedy for treating stuffy nose and sinus congestion. The nose can be irrigated by pouring, spraying, or squirting salt water into the nose and then letting it run back out.  Pain and Fever Medicines:  For pain or fever relief, take either acetaminophen or ibuprofen.  Pain and Fever Medicines:  For pain or fever relief, take either acetaminophen or ibuprofen.  Acetaminophen (e.g., Tylenol):  Regular Strength Tylenol: Take 650 mg (two 325 mg pills) by mouth every 4-6 hours as needed. Each Regular Strength Tylenol pill has 325 mg of acetaminophen.  Patient Will Follow Care Advice:  YES  Appointment Scheduled:  09/12/2013 15:15:00 Appointment Scheduled Provider:  Kelle Darting [facility])

## 2013-09-12 NOTE — Telephone Encounter (Signed)
FYI, pt is coming in at 3:15pm

## 2013-09-12 NOTE — Progress Notes (Signed)
  Subjective:    Patient ID: Timothy Gallagher, male    DOB: [birth year], 77 y.o.   MRN: [id number]  HPI  Patient has noted head pressure and elevations in the blood pressure   Review of Systems  Constitutional: Positive for fatigue. Negative for fever.  HENT: Positive for ear pain, facial swelling, hearing loss and sinus pressure. Negative for congestion and postnasal drip.   Eyes: Negative for discharge, redness and visual disturbance.  Respiratory: Negative for cough, shortness of breath and wheezing.   Cardiovascular: Negative for leg swelling.  Gastrointestinal: Negative for abdominal pain, constipation and abdominal distention.  Genitourinary: Negative for urgency and frequency.  Musculoskeletal: Negative for arthralgias, joint swelling and neck pain.  Skin: Negative for color change and rash.  Neurological: Negative for weakness and light-headedness.  Hematological: Negative for adenopathy.  Psychiatric/Behavioral: Negative for behavioral problems.       Objective:   Physical Exam  Constitutional: He appears well-developed and well-nourished.  HENT:  Head: Normocephalic and atraumatic.  Eyes: Conjunctivae are normal. Pupils are equal, round, and reactive to light.  Neck: Normal range of motion. Neck supple.  Cardiovascular: Normal rate and regular rhythm.   Pulmonary/Chest: Effort normal and breath sounds normal.  Abdominal: Soft. Bowel sounds are normal.          Assessment & Plan:  Need to follow a low salt diet Treatment for acute left frontal sinusitis.

## 2013-09-12 NOTE — Patient Instructions (Signed)
Sodium-Controlled Diet Sodium is a mineral. It is found in many foods. Sodium may be found naturally or added during the making of a food. The most common form of sodium is salt, which is made up of sodium and chloride. Reducing your sodium intake involves changing your eating habits. The following guidelines will help you reduce the sodium in your diet:  Stop using the salt shaker.  Use salt sparingly in cooking and baking.  Substitute with sodium-free seasonings and spices.  Do not use a salt substitute (potassium chloride) without your caregiver's permission.  Include a variety of fresh, unprocessed foods in your diet.  Limit the use of processed and convenience foods that are high in sodium. USE THE FOLLOWING FOODS SPARINGLY: Breads/Starches  Commercial bread stuffing, commercial pancake or waffle mixes, coating mixes. Waffles. Croutons. Prepared (boxed or frozen) potato, rice, or noodle mixes that contain salt or sodium. Salted French fries or hash browns. Salted popcorn, breads, crackers, chips, or snack foods. Vegetables  Vegetables canned with salt or prepared in cream, butter, or cheese sauces. Sauerkraut. Tomato or vegetable juices canned with salt.  Fresh vegetables are allowed if rinsed thoroughly. Fruit  Fruit is okay to eat. Meat and Meat Substitutes  Salted or smoked meats, such as bacon or Canadian bacon, chipped or corned beef, hot dogs, salt pork, luncheon meats, pastrami, ham, or sausage. Canned or smoked fish, poultry, or meat. Processed cheese or cheese spreads, blue or Roquefort cheese. Battered or frozen fish products. Prepared spaghetti sauce. Baked beans. Reuben sandwiches. Salted nuts. Caviar. Milk  Limit buttermilk to 1 cup per week. Soups and Combination Foods  Bouillon cubes, canned or dried soups, broth, consomm. Convenience (frozen or packaged) dinners with more than 600 mg sodium. Pot pies, pizza, Asian food, fast food cheeseburgers, and specialty  sandwiches. Desserts and Sweets  Regular (salted) desserts, pie, commercial fruit snack pies, commercial snack cakes, canned puddings.  Eat desserts and sweets in moderation. Fats and Oils  Gravy mixes or canned gravy. No more than 1 to 2 tbs of salad dressing. Chip dips.  Eat fats and oils in moderation. Beverages  See those listed under the vegetables and milk groups. Condiments  Ketchup, mustard, meat sauces, salsa, regular (salted) and lite soy sauce or mustard. Dill pickles, olives, meat tenderizer. Prepared horseradish or pickle relish. Dutch-processed cocoa. Baking powder or baking soda used medicinally. Worcestershire sauce. "Light" salt. Salt substitute, unless approved by your caregiver. Document Released: 04/25/2002 Document Revised: 01/26/2012 Document Reviewed: 11/26/2009 Exit[employer] Patient Information 2014 [employer].  

## 2013-09-16 ENCOUNTER — Encounter (INDEPENDENT_AMBULATORY_CARE_PROVIDER_SITE_OTHER): Payer: Medicare Other | Admitting: Surgery

## 2013-09-19 ENCOUNTER — Ambulatory Visit (INDEPENDENT_AMBULATORY_CARE_PROVIDER_SITE_OTHER): Payer: Medicare Other | Admitting: Internal Medicine

## 2013-09-19 DIAGNOSIS — D519 Vitamin B12 deficiency anemia, unspecified: Secondary | ICD-10-CM

## 2013-09-19 DIAGNOSIS — D518 Other vitamin B12 deficiency anemias: Secondary | ICD-10-CM

## 2013-09-19 MED ORDER — CYANOCOBALAMIN 1000 MCG/ML IJ SOLN
1000.0000 ug | INTRAMUSCULAR | Status: AC
  Administered 2013-09-19: 1000 ug via INTRAMUSCULAR

## 2013-10-10 ENCOUNTER — Ambulatory Visit (INDEPENDENT_AMBULATORY_CARE_PROVIDER_SITE_OTHER): Payer: Medicare Other | Admitting: Internal Medicine

## 2013-10-10 DIAGNOSIS — D518 Other vitamin B12 deficiency anemias: Secondary | ICD-10-CM

## 2013-10-10 DIAGNOSIS — D519 Vitamin B12 deficiency anemia, unspecified: Secondary | ICD-10-CM

## 2013-10-10 MED ORDER — CYANOCOBALAMIN 1000 MCG/ML IJ SOLN
1000.0000 ug | INTRAMUSCULAR | Status: AC
  Administered 2013-10-10: 1000 ug via INTRAMUSCULAR

## 2013-10-21 ENCOUNTER — Telehealth: Payer: Self-pay | Admitting: Internal Medicine

## 2013-10-21 NOTE — Telephone Encounter (Signed)
Pt decline to make appt with NP. Pt stated if pain gets worse he will callback

## 2013-10-21 NOTE — Telephone Encounter (Signed)
Noted  

## 2013-10-21 NOTE — Telephone Encounter (Signed)
Did pt make appt?

## 2013-10-21 NOTE — Telephone Encounter (Signed)
Patient Information:  Caller Name: Taven  Phone: [phone]  Patient: Timothy Gallagher, Timothy Gallagher  Gender: Male  DOB: 1936-04-01  Age: 77 Years  PCP: Darryll Capers (Adults only)  Office Follow Up:  Does the office need to follow up with this patient?: No  Instructions For The Office: N/A  RN Note:  Connected pt to Nelva Bush to schedule an appt sooner. Home care advice given.  Symptoms  Reason For Call & Symptoms: Pt calling about pain on left side above hip. Hurts worse after doing activities. Has an appt to see Dr [name] in Feb 2015.  Reviewed Health History In EMR: Yes  Reviewed Medications In EMR: Yes  Reviewed Allergies In EMR: Yes  Reviewed Surgeries / Procedures: Yes  Date of Onset of Symptoms: 07/22/2013  Treatments Tried: Ibuprofen  Treatments Tried Worked: Yes  Guideline(s) Used:  Back Pain  Disposition Per Guideline:   See Within 2 Weeks in Office  Reason For Disposition Reached:   Back pain persists > 2 weeks  Advice Given:  Reassurance:  Twisting or heavy lifting can cause back pain.  You can treat most back pain at home.  Here is some care advice that should help.  Cold or Heat:  Cold Pack: For pain or swelling, use a cold pack or ice wrapped in a wet cloth. Put it on the sore area for 20 minutes. Repeat 4 times on the first day, then as needed.  Heat Pack: If pain lasts over 2 days, apply heat to the sore area. Use a heat pack, heating pad, or warm wet washcloth. Do this for 10 minutes, then as needed. For widespread stiffness, take a hot bath or hot shower instead. Move the sore area under the warm water.  Activity  Keep doing your day-to-day activities if it is not too painful. Staying active is better than resting.  Avoid anything that makes your pain worse. Avoid heavy lifting, twisting, and too much exercise until your back heals.  Pain Medicines:  For pain relief, take acetaminophen, ibuprofen, or naproxen.  Use the lowest amount of medicine that makes your pain  feel better.  Call Back If:  Numbness or weakness occur  Bowel/bladder problems occur  Pain lasts for more than 2 weeks  You become worse.  Patient Will Follow Care Advice:  YES

## 2013-10-26 ENCOUNTER — Telehealth: Payer: Self-pay | Admitting: Internal Medicine

## 2013-10-26 NOTE — Telephone Encounter (Signed)
Per Cornell Barman, RN  Patient Information: Caller Name: Nicky Phone: [phone] Patient: Timothy, Gallagher Gender: Male DOB: May 12, 1936 Age: 77 Years PCP: Darryll Capers (Adults only)  Office Follow Up: Does the office need to follow up with this patient?: Yes Instructions For The Office: Please contact concerning blood pressure issues. Appt or medication adjustment.  RN Note: Please contact patient regarding blood pressure elevation in the morning. Patient seek guidance.  Symptoms Reason For Call & Symptoms: Patient states he is on blood pressure medication, however this morning it was 162/97  at 05:30 a.m.  and  it has been elevated for the last 4-5 days.  He takes his Micardis 80mg  , Indapamide 1.25mg   and  nadolol 20 mg.   After he takes his medicine the blood pressure comes down in 2-3 hours.  He is concerned he is waking with High pressures.   His pressure now  147/75. Reviewed Health History In EMR: Yes Reviewed Medications In EMR: Yes Reviewed Allergies In EMR: Yes Reviewed Surgeries / Procedures: Yes Date of Onset of Symptoms: 10/22/2013 Treatments Tried: Taking medication as directed. Treatments Tried Worked: Yes  Guideline(s) Used: High Blood Pressure  Disposition Per Guideline:   See Within 2 Weeks in Office  Reason For Disposition Reached:   BP > 140/90 and is taking BP medications  Advice Given: Lifestyle Changes Maintain a healthy weight. Lose weight if you are overweight. Do 30 minutes of aerobic physical activity (e.g., brisk walking) most days of the week. Eat a diet high in fresh fruits and low-fat dairy products. Limit your intake of saturated and total fat. Choose foods that are lower in salt. If you smoke, you should stop. If you drink alcohol, you should limit your daily alcohol drinking. Women should have no more than one drink per day. Men should have no more than 2 drinks per day. A drink is defined as 1.5 oz hard liquor (one shot or jigger; 45 ml),  5 oz wine (small glass; 150 ml), or 12 oz beer (one can; 360 ml). Call Back If: Headache, blurred vision, difficulty talking, or difficulty walking occurs Chest pain or difficulty breathing occurs You become worse. How Much Sodium (Salt) Should You Eat Each Day? Aim to eat less than 1,500 mg of sodium each day. Remember that one teaspoon of salt has 2,300 mg of sodium. Unfortunately, 75% of the salt in the average person's diet is in pre-processed foods. How To Reduce Your Sodium (Salt) Intake - DO: Buy and eat more fresh foods, especially fruit and vegetables Read the label on processed foods you buy. Choose processed foods with low salt labels or brands with the lowest percentage of sodium on the food label. Use other spices to make food taste better. Eat less food at restaurants and fast food outlets. Ask for less salt to be added in your food. How To Reduce Your Sodium (Salt) Intake - DO NOT: Buy or eat heavily salted foods. Examples include pickled foods, salted crackers or chips, and processed meats. Add salt while cooking or at the table.  RN Overrode Recommendation: Make Appointment Patient concerned about Blood pressure issues. Please contact.

## 2013-10-26 NOTE — Telephone Encounter (Signed)
Per dr [name] take micardis and nadolol at hs and the indapamide in am

## 2013-10-27 ENCOUNTER — Encounter: Payer: Self-pay | Admitting: Internal Medicine

## 2013-10-27 ENCOUNTER — Ambulatory Visit (INDEPENDENT_AMBULATORY_CARE_PROVIDER_SITE_OTHER): Payer: Medicare Other | Admitting: Internal Medicine

## 2013-10-27 VITALS — BP 186/86 | HR 50 | Temp 97.5°F | Resp 14 | Wt 199.8 lb

## 2013-10-27 DIAGNOSIS — I10 Essential (primary) hypertension: Secondary | ICD-10-CM

## 2013-10-27 MED ORDER — NADOLOL 20 MG PO TABS: ORAL_TABLET | ORAL | Status: DC

## 2013-10-27 NOTE — Progress Notes (Signed)
Pre visit review using our clinic review tool, if applicable. No additional management support is needed unless otherwise documented below in the visit note. 

## 2013-10-27 NOTE — Progress Notes (Signed)
   Subjective:    Patient ID: Timothy Gallagher, male    DOB: 01-Apr-1936, 77 y.o.   MRN: [id number]  HPI   His BP has been elevated this week without specific trigger. He did have a head cold with frontal headache; he denies taking any decongestants.  He is on indapamide, nadolol, and telmisartan with blood pressure  Recordings reveal range of blood pressure from 162/89-181/105.   Review of Systems  Compliant with anti hypertemsive medication. No lightheadedness or other adverse medication effect described.  Significant headaches, epistaxis, chest pain, palpitations, exertional dyspnea, claudication, paroxysmal nocturnal dyspnea, or edema absent.     Objective:   Physical Exam Appears healthy and well-nourished & in no acute distress. Appears younger than stated age  Pupils are small limiting fundal exam. Copper wiring & arteriolar narrowing is suggested No carotid bruits are present.No neck pain distention present at 10 - 15 degrees. Thyroid normal to palpation  Heart rhythm and rate are normal with no significant murmurs or gallops. Accentuated S1  Chest is clear with no increased work of breathing  There is no evidence of aortic aneurysm or renal artery bruits  Abdomen soft with no organomegaly or masses. No HJR  No clubbing, cyanosis or edema present.  Pedal pulses are intact   No ischemic skin changes are present . Nails healthy   Alert and oriented. Strength & tone normal          Assessment & Plan:   #1 accelerated HTN See orders

## 2013-10-27 NOTE — Telephone Encounter (Signed)
Patient Information:  Caller Name: Melvyn Neth  Phone: [phone]  Patient: Timothy Gallagher, Timothy Gallagher  Gender: Male  DOB: 1936-01-01  Age: 77 Years  PCP: Darryll Capers (Adults only)  Office Follow Up:  Does the office need to follow up with this patient?: Yes  Instructions For The Office: Contact patient to give further instructions.  RN Note:  Offered patient an appointment at another office, but he would prefer to be seen in this office for his symptoms. Please contact patient regarding these symptoms.  Symptoms  Reason For Call & Symptoms: High blood pressure, BP 171/90. Reports sinus pain/pressure, drainage.  Reviewed Health History In EMR: Yes  Reviewed Medications In EMR: Yes  Reviewed Allergies In EMR: Yes  Reviewed Surgeries / Procedures: Yes  Date of Onset of Symptoms: 10/27/2013  Treatments Tried: Normal prescribed blood pressure medicine  Treatments Tried Worked: No  Guideline(s) Used:  High Blood Pressure  Sinus Pain and Congestion  Disposition Per Guideline:   See Today or Tomorrow in Office  Reason For Disposition Reached:   Nasal discharge present > 10 days  Advice Given:  N/A  Patient Will Follow Care Advice:  YES

## 2013-10-27 NOTE — Assessment & Plan Note (Signed)
Hypertension is accelerated; nadolol 20 mg one half twice daily will be increased to one pill twice a day. If blood pressure fails to respond; hydralazine 25 mg 3 times a day would be recommended. He should followup in one week.

## 2013-10-27 NOTE — Patient Instructions (Signed)
Minimal Blood Pressure Goal= AVERAGE < 140/90;  Ideal is an AVERAGE < 135/85. This AVERAGE should be calculated from 3 BP readings taken every 6- 8 hrs . You should not respond to isolated BP readings , but rather the AVERAGE for that week .Please take your  blood pressure cuff to office visits to verify that it is reliable.It  can also be checked against the blood pressure device at the pharmacy.

## 2013-10-31 ENCOUNTER — Ambulatory Visit (INDEPENDENT_AMBULATORY_CARE_PROVIDER_SITE_OTHER): Payer: Medicare Other | Admitting: Internal Medicine

## 2013-10-31 DIAGNOSIS — D519 Vitamin B12 deficiency anemia, unspecified: Secondary | ICD-10-CM

## 2013-10-31 DIAGNOSIS — D518 Other vitamin B12 deficiency anemias: Secondary | ICD-10-CM

## 2013-10-31 MED ORDER — CYANOCOBALAMIN 1000 MCG/ML IJ SOLN
1000.0000 ug | INTRAMUSCULAR | Status: AC
  Administered 2013-10-31: 1000 ug via INTRAMUSCULAR

## 2013-11-04 ENCOUNTER — Encounter: Payer: Self-pay | Admitting: Family Medicine

## 2013-11-04 ENCOUNTER — Ambulatory Visit (INDEPENDENT_AMBULATORY_CARE_PROVIDER_SITE_OTHER): Payer: Medicare Other | Admitting: Family Medicine

## 2013-11-04 VITALS — BP 138/82 | HR 60 | Temp 97.4°F | Wt 199.0 lb

## 2013-11-04 DIAGNOSIS — Z23 Encounter for immunization: Secondary | ICD-10-CM

## 2013-11-04 DIAGNOSIS — I10 Essential (primary) hypertension: Secondary | ICD-10-CM

## 2013-11-04 NOTE — Progress Notes (Signed)
   Subjective:    Patient ID: Timothy Gallagher, male    DOB: 1936-11-12, 77 y.o.   MRN: [id number]  HPI Followup hypertension. Patient had recent elevation. He is being treated with nadalol and indapamide had recent addition of Micardis. He's been compliant with therapy. Home blood pressures ranging 130s to 140 systolic and 70s to low 80s diastolic. No headaches. No dizziness. No chest pains. No regular alcohol consumption. Patient had recent labs including basic metabolic panel. Renal function is normal.  He's had flu vaccine. No history of documented pneumonia vaccine. He is requesting this today.  Past Medical History  Diagnosis Date  . ACNE ROSACEA 06/27/2009  . ACTINIC KERATOSIS, HEAD 04/18/2009  . Acute maxillary sinusitis 05/14/2010  . ALLERGIC RHINITIS 04/09/2007  . B12 DEFICIENCY 06/07/2007  . BACK PAIN WITH RADICULOPATHY 04/24/2008  . CHEST WALL PAIN, ACUTE 06/15/2009  . Chronic maxillary sinusitis 05/29/2008  . COLITIS 04/27/2009  . COLONIC POLYPS, HX OF 04/27/2009    tubular adenomas  . DERMATITIS, ATOPIC 10/12/2007  . DIVERTICULOSIS, COLON 04/27/2009  . ECCHYMOSES, SPONTANEOUS 06/27/2008  . Elevated sedimentation rate 05/02/2009  . ESOPHAGEAL STRICTURE 04/27/2009  . GASTRITIS, CHRONIC 04/27/2009  . HYPERLIPIDEMIA 03/13/2008  . HYPERTENSION 04/09/2007  . Irritable bowel syndrome 08/11/2007  . KIDNEY DISEASE 04/09/2007  . NECK PAIN 09/14/2008  . NEUROPATHY, IDIOPATHIC PERIPHERAL NEC 08/11/2007  . OSTEOARTHRITIS, WRIST, RIGHT 08/05/2010  . Cancer     skin   Past Surgical History  Procedure Laterality Date  . Hernia repair    . Lamenectomy    . Lumbar fusion    . Tonsillectomy      reports that he quit smoking about 11 years ago. He has never used smokeless tobacco. He reports that he does not drink alcohol or use illicit drugs. family history includes COPD in his father; Heart disease in his father; Hernia in his mother. There is no history of Colon cancer. Allergies  Allergen  Reactions  . Amoxicillin     REACTION: unspecified  . Atorvastatin     REACTION: nausea and blurred vision  . Ciprofloxacin     REACTION: unspecified  . Mycophenolate Mofetil     REACTION: unspecified  . Rosuvastatin       Review of Systems  Constitutional: Negative for fatigue.  Eyes: Negative for visual disturbance.  Respiratory: Negative for cough, chest tightness and shortness of breath.   Cardiovascular: Negative for chest pain, palpitations and leg swelling.  Endocrine: Negative for polydipsia and polyuria.  Neurological: Negative for dizziness, syncope, weakness, light-headedness and headaches.       Objective:   Physical Exam  Constitutional: He is oriented to person, place, and time. He appears well-developed and well-nourished.  HENT:  Right Ear: External ear normal.  Left Ear: External ear normal.  Mouth/Throat: Oropharynx is clear and moist.  Eyes: Pupils are equal, round, and reactive to light.  Neck: Neck supple. No thyromegaly present.  Cardiovascular: Normal rate and regular rhythm.   Pulmonary/Chest: Effort normal and breath sounds normal. No respiratory distress. He has no wheezes. He has no rales.  Musculoskeletal: He exhibits no edema.  Neurological: He is alert and oriented to person, place, and time.          Assessment & Plan:  Hypertension. Improved. Continue current medications. Prevnar 13 given. Recommended routine followup with primary in 6 months

## 2013-11-04 NOTE — Progress Notes (Signed)
Pre visit review using our clinic review tool, if applicable. No additional management support is needed unless otherwise documented below in the visit note. 

## 2013-11-04 NOTE — Patient Instructions (Signed)

## 2013-11-04 NOTE — Addendum Note (Signed)
Addended by: Jimmye Norman on: 11/04/2013 10:03 AM   Modules accepted: Orders

## 2013-11-21 ENCOUNTER — Ambulatory Visit: Payer: Medicare Other | Admitting: Internal Medicine

## 2013-11-26 ENCOUNTER — Telehealth: Payer: Self-pay | Admitting: Internal Medicine

## 2013-11-26 NOTE — Telephone Encounter (Signed)
[facility] requesting refill of nadolol (CORGARD) 20 MG tablet

## 2013-11-28 ENCOUNTER — Other Ambulatory Visit: Payer: Self-pay | Admitting: *Deleted

## 2013-11-28 MED ORDER — NADOLOL 20 MG PO TABS: ORAL_TABLET | ORAL | Status: DC

## 2013-11-28 NOTE — Telephone Encounter (Signed)
done

## 2013-11-28 NOTE — Telephone Encounter (Signed)
Pt states he will run out of medication before he gets his rx from [facility] order, he would like a temporary supply sent to [place].  Please also send 90 day supply to [facility] as previously requested.

## 2013-11-29 ENCOUNTER — Other Ambulatory Visit: Payer: Self-pay | Admitting: *Deleted

## 2013-11-29 DIAGNOSIS — I10 Essential (primary) hypertension: Secondary | ICD-10-CM

## 2013-11-29 MED ORDER — NADOLOL 20 MG PO TABS: ORAL_TABLET | ORAL | Status: DC

## 2013-11-29 NOTE — Telephone Encounter (Signed)
Sent again

## 2013-11-29 NOTE — Telephone Encounter (Signed)
Pt states his rx nadolol (CORGARD) 20 MG tablet is not at the local [facility]. Can you resend and also pt wants to confirm his 90 day sent to [facility]

## 2013-12-01 ENCOUNTER — Ambulatory Visit: Payer: Medicare Other | Admitting: Gastroenterology

## 2013-12-02 ENCOUNTER — Other Ambulatory Visit: Payer: Self-pay | Admitting: Internal Medicine

## 2013-12-05 ENCOUNTER — Ambulatory Visit (INDEPENDENT_AMBULATORY_CARE_PROVIDER_SITE_OTHER): Payer: Medicare Other | Admitting: Family Medicine

## 2013-12-05 ENCOUNTER — Telehealth: Payer: Self-pay | Admitting: Internal Medicine

## 2013-12-05 ENCOUNTER — Encounter: Payer: Self-pay | Admitting: Family Medicine

## 2013-12-05 VITALS — BP 142/76 | HR 54 | Temp 97.6°F | Wt 200.0 lb

## 2013-12-05 DIAGNOSIS — E538 Deficiency of other specified B group vitamins: Secondary | ICD-10-CM

## 2013-12-05 DIAGNOSIS — R351 Nocturia: Secondary | ICD-10-CM

## 2013-12-05 DIAGNOSIS — N400 Enlarged prostate without lower urinary tract symptoms: Secondary | ICD-10-CM

## 2013-12-05 DIAGNOSIS — N138 Other obstructive and reflux uropathy: Secondary | ICD-10-CM | POA: Insufficient documentation

## 2013-12-05 DIAGNOSIS — I10 Essential (primary) hypertension: Secondary | ICD-10-CM

## 2013-12-05 LAB — POCT URINALYSIS DIPSTICK
Bilirubin, UA: NEGATIVE
Glucose, UA: NEGATIVE
Ketones, UA: NEGATIVE
Leukocytes, UA: NEGATIVE
Nitrite, UA: NEGATIVE
Spec Grav, UA: >=1.03
Urobilinogen, UA: 0.2
pH, UA: 5.5

## 2013-12-05 MED ORDER — CYANOCOBALAMIN 1000 MCG/ML IJ SOLN
1000.0000 ug | Freq: Once | INTRAMUSCULAR | Status: AC
  Administered 2013-12-05: 1000 ug via INTRAMUSCULAR

## 2013-12-05 NOTE — Telephone Encounter (Signed)
Patient Information:  Caller Name: Zhane  Phone: [phone]  Patient: Timothy Gallagher, Timothy Gallagher  Gender: Male  DOB: Dec 17, 1935  Age: 78 Years  PCP: Benay Pillow (Adults only)  Office Follow Up:  Does the office need to follow up with this patient?: No  Instructions For The Office: N/A  RN Note:  BP 184/106 L arm sitting at 0810 and 196/112 at 0530 No hematuria or odor to urine. Mild headache he relates to sinus congestion. Denies frequency or urgency. No appointments remain with Dr Arnoldo Morale.  Prefers to see Dr Elease Hashimoto since last visit 11/04/13 was with him.   Symptoms  Reason For Call & Symptoms: Persistent hypertension. Also reports nocturia with 3-4 voids during night.  Reviewed Health History In EMR: Yes  Reviewed Medications In EMR: Yes  Reviewed Allergies In EMR: Yes  Reviewed Surgeries / Procedures: Yes  Date of Onset of Symptoms: 11/28/2013  Treatments Tried: MD changed two BP meds 11/04/13  Treatments Tried Worked: No  Guideline(s) Used:  High Blood Pressure  Disposition Per Guideline:   See Today in Office  Reason For Disposition Reached:   BP > 180/110  Advice Given:  General:  Untreated high blood pressure may cause damage to the heart, brain, kidneys, and eyes.  Treatment of high blood pressure can reduce the risk of stroke, heart attack, and heart failure.  The goal of blood pressure treatment for most patients with hypertension is to keep the blood pressure under 140/90.  Lifestyle Changes  Do 30 minutes of aerobic physical activity (e.g., brisk walking) most days of the week.  Eat a diet high in fresh fruits and low-fat dairy products. Limit your intake of saturated and total fat. Choose foods that are lower in salt.  Call Back If:  Headache, blurred vision, difficulty talking, or difficulty walking occurs  Chest pain or difficulty breathing occurs  You want to go in to the office for a blood pressure check  You become worse.  Patient Will Follow Care  Advice:  YES  Appointment Scheduled:  12/05/2013 11:45:00 Appointment Scheduled Provider:  Carolann Littler [facility])

## 2013-12-05 NOTE — Progress Notes (Signed)
   Subjective:    Patient ID: Timothy Gallagher, male    DOB: [birth year], 78 y.o.   MRN: [id number]  HPI Patient is here with concerns for elevated blood pressure and some urinary frequency, especially at night. Has hypertension treated with 3 drug regimen of telmisartan, indapamide, and nadolol. He states he had blood pressure yesterday 196/112. Denies any headache. No dizziness. No chest pains. He has been compliant with therapy recently. No alcohol use.  He has history of urine frequency especially at night but symptoms in daytime as well. No burning with urination. Has seen neurologist previously. He has urine urgency but no urge incontinence. He was previously prescribed Flomax which helped but really to this apparently for several days and quit. He still has some at home. PSA 3.52 with urologist back in October. He has occasional slow stream.  Past Medical History  Diagnosis Date  . ACNE ROSACEA 06/27/2009  . ACTINIC KERATOSIS, HEAD 04/18/2009  . Acute maxillary sinusitis 05/14/2010  . ALLERGIC RHINITIS 04/09/2007  . B12 DEFICIENCY 06/07/2007  . BACK PAIN WITH RADICULOPATHY 04/24/2008  . CHEST WALL PAIN, ACUTE 06/15/2009  . Chronic maxillary sinusitis 05/29/2008  . COLITIS 04/27/2009  . COLONIC POLYPS, HX OF 04/27/2009    tubular adenomas  . DERMATITIS, ATOPIC 10/12/2007  . DIVERTICULOSIS, COLON 04/27/2009  . ECCHYMOSES, SPONTANEOUS 06/27/2008  . Elevated sedimentation rate 05/02/2009  . ESOPHAGEAL STRICTURE 04/27/2009  . GASTRITIS, CHRONIC 04/27/2009  . HYPERLIPIDEMIA 03/13/2008  . HYPERTENSION 04/09/2007  . Irritable bowel syndrome 08/11/2007  . KIDNEY DISEASE 04/09/2007  . NECK PAIN 09/14/2008  . NEUROPATHY, IDIOPATHIC PERIPHERAL NEC 08/11/2007  . OSTEOARTHRITIS, WRIST, RIGHT 08/05/2010  . Cancer     skin   Past Surgical History  Procedure Laterality Date  . Hernia repair    . Lamenectomy    . Lumbar fusion    . Tonsillectomy      reports that he quit smoking about 12 years ago. He  has never used smokeless tobacco. He reports that he does not drink alcohol or use illicit drugs. family history includes COPD in his father; Heart disease in his father; Hernia in his mother. There is no history of Colon cancer. Allergies  Allergen Reactions  . Amoxicillin     REACTION: unspecified  . Atorvastatin     REACTION: nausea and blurred vision  . Ciprofloxacin     REACTION: unspecified  . Mycophenolate Mofetil     REACTION: unspecified  . Rosuvastatin         Review of Systems  Constitutional: Negative for fever, chills and fatigue.  Eyes: Negative for visual disturbance.  Respiratory: Negative for cough, chest tightness and shortness of breath.   Cardiovascular: Negative for chest pain, palpitations and leg swelling.  Genitourinary: Positive for dysuria and frequency. Negative for hematuria.  Neurological: Negative for dizziness, syncope, weakness, light-headedness and headaches.       Objective:   Physical Exam  Constitutional: He appears well-developed and well-nourished. No distress.  Cardiovascular: Normal rate.   Pulmonary/Chest: Effort normal and breath sounds normal. No respiratory distress. He has no wheezes. He has no rales.  Musculoskeletal: He exhibits no edema.          Assessment & Plan:  #1 hypertension..By our blood pressure reading today stable. Continue current regimen and monitoring #2 BPH. Moderately symptomatic. Get back on Flomax 0.5 mg once daily.

## 2013-12-05 NOTE — Telephone Encounter (Signed)
Noted  

## 2013-12-05 NOTE — Patient Instructions (Signed)
Benign Prostatic Hyperplasia An enlarged prostate (benign prostatic hyperplasia) is common in older men. You may experience the following:  Weak urine stream.  Dribbling.  Feeling like the bladder has not emptied completely.  Difficulty starting urination.  Getting up frequently at night to urinate.  Urinating more frequently during the day. HOME CARE INSTRUCTIONS  Monitor your prostatic hyperplasia for any changes. The following actions may help to alleviate any discomfort you are experiencing:  Give yourself time when you urinate.  Stay away from alcohol.  Avoid beverages containing caffeine, such as coffee, tea, and colas, because they can make the problem worse.  Avoid decongestants, antihistamines, and some prescription medicines that can make the problem worse.  Follow up with your health care provider for further treatment as recommended. SEEK MEDICAL CARE IF:  You are experiencing progressive difficulty voiding.  Your urine stream is progressively getting narrower.  You are awaking from sleep with the urge to void more frequently.  You are constantly feeling the need to void.  You experience loss of urine, especially in small amounts. SEEK IMMEDIATE MEDICAL CARE IF:   You develop increased pain with urination or are unable to urinate.  You develop severe abdominal pain, vomiting, a high fever, or fainting.  You develop back pain or blood in your urine. MAKE SURE YOU:   Understand these instructions.  Will watch your condition.  Will get help right away if you are not doing well or get worse. Document Released: 11/03/2005 Document Revised: 07/06/2013 Document Reviewed: 04/05/2013 [facility] Patient Information 2014 [place].  Get back on Tamsulosin for prostate issues.

## 2013-12-05 NOTE — Progress Notes (Signed)
Pre visit review using our clinic review tool, if applicable. No additional management support is needed unless otherwise documented below in the visit note. 

## 2013-12-05 NOTE — Telephone Encounter (Signed)
FYI!  This patient is coming to see BB.

## 2013-12-15 ENCOUNTER — Telehealth: Payer: Self-pay | Admitting: Gastroenterology

## 2013-12-15 NOTE — Telephone Encounter (Signed)
Pt will see Dr [name] in March, 2015; reminder mailed. Pt stated understanding.

## 2013-12-20 ENCOUNTER — Ambulatory Visit: Admit: 2013-12-20 | Payer: Self-pay | Admitting: Orthopedic Surgery

## 2013-12-20 ENCOUNTER — Encounter (HOSPITAL_COMMUNITY): Payer: Self-pay | Admitting: Emergency Medicine

## 2013-12-20 ENCOUNTER — Emergency Department (HOSPITAL_COMMUNITY): Payer: Medicare Other

## 2013-12-20 ENCOUNTER — Emergency Department (HOSPITAL_COMMUNITY): Payer: Medicare Other | Admitting: Anesthesiology

## 2013-12-20 ENCOUNTER — Encounter (HOSPITAL_COMMUNITY)
Admission: EM | Disposition: A | Discharge status: Home / Self Care | Payer: Self-pay | Source: Home / Self Care | Attending: Emergency Medicine

## 2013-12-20 ENCOUNTER — Encounter (HOSPITAL_COMMUNITY): Payer: Medicare Other | Admitting: Anesthesiology

## 2013-12-20 ENCOUNTER — Ambulatory Visit (HOSPITAL_COMMUNITY)
Admission: EM | Admit: 2013-12-20 | Discharge: 2013-12-20 | Disposition: A | Discharge status: Home / Self Care | Payer: Medicare Other | Attending: Emergency Medicine | Admitting: Emergency Medicine

## 2013-12-20 DIAGNOSIS — K589 Irritable bowel syndrome without diarrhea: Secondary | ICD-10-CM | POA: Insufficient documentation

## 2013-12-20 DIAGNOSIS — Z860100 Personal history of colon polyps, unspecified: Secondary | ICD-10-CM | POA: Insufficient documentation

## 2013-12-20 DIAGNOSIS — Z85828 Personal history of other malignant neoplasm of skin: Secondary | ICD-10-CM | POA: Insufficient documentation

## 2013-12-20 DIAGNOSIS — G609 Hereditary and idiopathic neuropathy, unspecified: Secondary | ICD-10-CM | POA: Insufficient documentation

## 2013-12-20 DIAGNOSIS — I10 Essential (primary) hypertension: Secondary | ICD-10-CM | POA: Insufficient documentation

## 2013-12-20 DIAGNOSIS — S62609B Fracture of unspecified phalanx of unspecified finger, initial encounter for open fracture: Secondary | ICD-10-CM

## 2013-12-20 DIAGNOSIS — IMO0002 Reserved for concepts with insufficient information to code with codable children: Secondary | ICD-10-CM | POA: Insufficient documentation

## 2013-12-20 DIAGNOSIS — E785 Hyperlipidemia, unspecified: Secondary | ICD-10-CM | POA: Insufficient documentation

## 2013-12-20 DIAGNOSIS — J309 Allergic rhinitis, unspecified: Secondary | ICD-10-CM | POA: Insufficient documentation

## 2013-12-20 DIAGNOSIS — W312XXA Contact with powered woodworking and forming machines, initial encounter: Secondary | ICD-10-CM | POA: Insufficient documentation

## 2013-12-20 DIAGNOSIS — E538 Deficiency of other specified B group vitamins: Secondary | ICD-10-CM | POA: Insufficient documentation

## 2013-12-20 DIAGNOSIS — Z87891 Personal history of nicotine dependence: Secondary | ICD-10-CM | POA: Insufficient documentation

## 2013-12-20 HISTORY — PX: INCISION AND DRAINAGE WOUND WITH FOREIGN BODY REMOVAL: SHX5635

## 2013-12-20 LAB — POCT I-STAT, CHEM 8
BUN: 22 mg/dL (ref 6–23)
Calcium, Ion: 1.23 mmol/L (ref 1.13–1.30)
Chloride: 103 mEq/L (ref 96–112)
Creatinine, Ser: 1 mg/dL (ref 0.50–1.35)
Glucose, Bld: 99 mg/dL (ref 70–99)
HCT: 40 % (ref 39.0–52.0)
Hemoglobin: 13.6 g/dL (ref 13.0–17.0)
Potassium: 3.9 mEq/L (ref 3.7–5.3)
Sodium: 142 mEq/L (ref 137–147)
TCO2: 29 mmol/L (ref 0–100)

## 2013-12-20 SURGERY — INCISION AND DRAINAGE WOUND WITH FOREIGN BODY REMOVAL
Anesthesia: Monitor Anesthesia Care | Laterality: Left

## 2013-12-20 MED ORDER — LIDOCAINE HCL (CARDIAC) 20 MG/ML IV SOLN
INTRAVENOUS | Status: AC
  Filled 2013-12-20: qty 5

## 2013-12-20 MED ORDER — FENTANYL CITRATE 0.05 MG/ML IJ SOLN
INTRAMUSCULAR | Status: AC
  Filled 2013-12-20: qty 2

## 2013-12-20 MED ORDER — SODIUM CHLORIDE 0.9 % IV SOLN
INTRAVENOUS | Status: DC
  Administered 2013-12-20: 17:00:00 via INTRAVENOUS

## 2013-12-20 MED ORDER — PROPOFOL 10 MG/ML IV BOLUS
INTRAVENOUS | Status: AC
  Filled 2013-12-20: qty 20

## 2013-12-20 MED ORDER — LACTATED RINGERS IV SOLN: INTRAVENOUS | Status: DC

## 2013-12-20 MED ORDER — ONDANSETRON HCL 4 MG/2ML IJ SOLN
INTRAMUSCULAR | Status: AC
  Filled 2013-12-20: qty 2

## 2013-12-20 MED ORDER — LACTATED RINGERS IV SOLN
INTRAVENOUS | Status: DC | PRN
  Administered 2013-12-20: 20:00:00 via INTRAVENOUS

## 2013-12-20 MED ORDER — EPHEDRINE SULFATE 50 MG/ML IJ SOLN
INTRAMUSCULAR | Status: DC | PRN
  Administered 2013-12-20 (×2): 5 mg via INTRAVENOUS

## 2013-12-20 MED ORDER — LIDOCAINE HCL (CARDIAC) 20 MG/ML IV SOLN
INTRAVENOUS | Status: DC | PRN
  Administered 2013-12-20: 50 mg via INTRAVENOUS

## 2013-12-20 MED ORDER — MIDAZOLAM HCL 2 MG/2ML IJ SOLN
INTRAMUSCULAR | Status: AC
  Filled 2013-12-20: qty 2

## 2013-12-20 MED ORDER — FENTANYL CITRATE 0.05 MG/ML IJ SOLN: 25.0000 ug | INTRAMUSCULAR | Status: DC | PRN

## 2013-12-20 MED ORDER — MIDAZOLAM HCL 5 MG/5ML IJ SOLN
INTRAMUSCULAR | Status: DC | PRN
  Administered 2013-12-20: 2 mg via INTRAVENOUS

## 2013-12-20 MED ORDER — PROPOFOL 10 MG/ML IV BOLUS
INTRAVENOUS | Status: DC | PRN
  Administered 2013-12-20: 20 mg via INTRAVENOUS
  Administered 2013-12-20: 130 mg via INTRAVENOUS

## 2013-12-20 MED ORDER — BUPIVACAINE HCL (PF) 0.5 % IJ SOLN
INTRAMUSCULAR | Status: AC
  Filled 2013-12-20: qty 30

## 2013-12-20 MED ORDER — BUPIVACAINE HCL (PF) 0.5 % IJ SOLN
INTRAMUSCULAR | Status: DC | PRN
Start: 2013-12-20 — End: 2013-12-20
  Administered 2013-12-20: 8 mL

## 2013-12-20 MED ORDER — SULFAMETHOXAZOLE-TRIMETHOPRIM 800-160 MG PO TABS: 1.0000 | ORAL_TABLET | Freq: Two times a day (BID) | ORAL | Status: DC

## 2013-12-20 MED ORDER — VANCOMYCIN HCL IN DEXTROSE 1-5 GM/200ML-% IV SOLN
1000.0000 mg | Freq: Once | INTRAVENOUS | Status: AC
  Administered 2013-12-20: 1000 mg via INTRAVENOUS
  Filled 2013-12-20: qty 200

## 2013-12-20 MED ORDER — OXYCODONE-ACETAMINOPHEN 5-325 MG PO TABS
ORAL_TABLET | ORAL | Status: DC
Start: 2013-12-20 — End: 2014-05-09

## 2013-12-20 MED ORDER — ONDANSETRON HCL 4 MG/2ML IJ SOLN
INTRAMUSCULAR | Status: DC | PRN
  Administered 2013-12-20: 4 mg via INTRAVENOUS

## 2013-12-20 MED ORDER — CLINDAMYCIN HCL 300 MG PO CAPS: 300.0000 mg | ORAL_CAPSULE | Freq: Once | ORAL | Status: DC

## 2013-12-20 MED ORDER — FENTANYL CITRATE 0.05 MG/ML IJ SOLN
INTRAMUSCULAR | Status: DC | PRN
  Administered 2013-12-20 (×2): 12.5 ug via INTRAVENOUS
  Administered 2013-12-20: 50 ug via INTRAVENOUS

## 2013-12-20 MED ORDER — PROPOFOL INFUSION 10 MG/ML OPTIME
INTRAVENOUS | Status: DC | PRN
  Administered 2013-12-20: 100 ug/kg/min via INTRAVENOUS

## 2013-12-20 MED ORDER — LIDOCAINE HCL 2 % IJ SOLN
INTRAMUSCULAR | Status: AC
  Filled 2013-12-20: qty 20

## 2013-12-20 MED ORDER — PROMETHAZINE HCL 25 MG/ML IJ SOLN: 6.2500 mg | INTRAMUSCULAR | Status: DC | PRN

## 2013-12-20 SURGICAL SUPPLY — 40 items
BAG SPEC THK2 15X12 ZIP CLS (MISCELLANEOUS)
BAG ZIPLOCK 12X15 (MISCELLANEOUS) ×1 IMPLANT
BANDAGE ELASTIC 4 VELCRO ST LF (GAUZE/BANDAGES/DRESSINGS) ×1 IMPLANT
BNDG COHESIVE 1X5 TAN STRL LF (GAUZE/BANDAGES/DRESSINGS) ×1 IMPLANT
BNDG COHESIVE 4X5 TAN STRL (GAUZE/BANDAGES/DRESSINGS) ×1 IMPLANT
BNDG GAUZE ELAST 4 BULKY (GAUZE/BANDAGES/DRESSINGS) ×1 IMPLANT
CANISTER SUCTION 2500CC (MISCELLANEOUS) ×1 IMPLANT
CANNULA VESSEL W/WING WO/VALVE (CANNULA) ×1 IMPLANT
CLEANER TIP ELECTROSURG 2X2 (MISCELLANEOUS) ×2 IMPLANT
CORDS BIPOLAR (ELECTRODE) ×2 IMPLANT
CUFF TOURN SGL QUICK 18 (TOURNIQUET CUFF) ×2 IMPLANT
CUFF TOURN SGL QUICK 24 (TOURNIQUET CUFF)
CUFF TRNQT CYL 24X4X40X1 (TOURNIQUET CUFF) ×1 IMPLANT
DRAIN PENROSE 18X1/2 LTX STRL (DRAIN) ×1 IMPLANT
DRAIN PENROSE 18X1/4 LTX STRL (WOUND CARE) ×1 IMPLANT
ELECT REM PT RETURN 9FT ADLT (ELECTROSURGICAL) ×2
ELECTRODE REM PT RTRN 9FT ADLT (ELECTROSURGICAL) ×1 IMPLANT
GAUZE IODOFORM PACK 1/2 7832 (GAUZE/BANDAGES/DRESSINGS) ×1 IMPLANT
GAUZE PACKING IODOFORM 1/4X15 (GAUZE/BANDAGES/DRESSINGS) ×1 IMPLANT
GAUZE SPONGE 4X4 16PLY XRAY LF (GAUZE/BANDAGES/DRESSINGS) ×1 IMPLANT
GAUZE XEROFORM 1X8 LF (GAUZE/BANDAGES/DRESSINGS) ×1 IMPLANT
GAUZE XEROFORM 5X9 LF (GAUZE/BANDAGES/DRESSINGS) ×1 IMPLANT
GLOVE BIO SURGEON STRL SZ7.5 (GLOVE) ×4 IMPLANT
GLOVE BIOGEL PI IND STRL 8 (GLOVE) ×1 IMPLANT
GLOVE BIOGEL PI INDICATOR 8 (GLOVE) ×1
GOWN SPEC L3 XXLG W/TWL (GOWN DISPOSABLE) ×2 IMPLANT
HANDPIECE INTERPULSE COAX TIP (DISPOSABLE)
KIT BASIN OR (CUSTOM PROCEDURE TRAY) ×2 IMPLANT
MANIFOLD NEPTUNE II (INSTRUMENTS) ×2 IMPLANT
PACK LOWER EXTREMITY WL (CUSTOM PROCEDURE TRAY) ×2 IMPLANT
PAD ABD 8X10 STRL (GAUZE/BANDAGES/DRESSINGS) ×1 IMPLANT
PAD CAST 4YDX4 CTTN HI CHSV (CAST SUPPLIES) ×1 IMPLANT
PADDING CAST COTTON 4X4 STRL (CAST SUPPLIES)
SET HNDPC FAN SPRY TIP SCT (DISPOSABLE) ×1 IMPLANT
SPONGE GAUZE 4X4 12PLY (GAUZE/BANDAGES/DRESSINGS) ×2 IMPLANT
SUT ETHILON 4 0 PS 2 18 (SUTURE) ×2 IMPLANT
SUT MON AB 5-0 PS2 18 (SUTURE) ×1 IMPLANT
SUT SILK 4 0 PS 2 (SUTURE) ×2 IMPLANT
SYR 20CC LL (SYRINGE) ×1 IMPLANT
SYR CONTROL 10ML LL (SYRINGE) ×1 IMPLANT

## 2013-12-20 NOTE — ED Notes (Signed)
Patient reports cutting his left index finger with a table saw. Continues to ooze slightly.

## 2013-12-20 NOTE — Anesthesia Postprocedure Evaluation (Signed)
  Anesthesia Post-op Note  Patient: Timothy Gallagher  Procedure(s) Performed: Procedure(s) (LRB): INCISION AND DRAINAGE LEFT INDEX FINGER (Left)  Patient Location: PACU  Anesthesia Type: General  Level of Consciousness: awake and alert   Airway and Oxygen Therapy: Patient Spontanous Breathing  Post-op Pain: mild  Post-op Assessment: Post-op Vital signs reviewed, Patient's Cardiovascular Status Stable, Respiratory Function Stable, Patent Airway and No signs of Nausea or vomiting  Last Vitals:  Filed Vitals:   12/20/13 2145  BP: 138/82  Pulse:   Temp:   Resp:     Post-op Vital Signs: stable   Complications: No apparent anesthesia complications

## 2013-12-20 NOTE — Transfer of Care (Signed)
Immediate Anesthesia Transfer of Care Note  Patient: Timothy Gallagher  Procedure(s) Performed: Procedure(s): INCISION AND DRAINAGE LEFT INDEX FINGER (Left)  Patient Location: PACU  Anesthesia Type:General  Level of Consciousness: awake, alert  and oriented  Airway & Oxygen Therapy: Patient Spontanous Breathing and Patient connected to face mask oxygen  Post-op Assessment: Report given to PACU RN and Post -op Vital signs reviewed and stable  Post vital signs: Reviewed and stable  Complications: No apparent anesthesia complications

## 2013-12-20 NOTE — Progress Notes (Signed)
ANTIBIOTIC CONSULT NOTE - INITIAL  Pharmacy Consult for vancomycin Indication: open wound on index finger- surgical prophylaxis  Allergies  Allergen Reactions  . Atorvastatin     REACTION: nausea and blurred vision  . Ciprofloxacin Swelling  . Mycophenolate Mofetil     REACTION: unspecified  . Rosuvastatin     Unknown   . Amoxicillin Rash  . Penicillins Rash    Patient Measurements: Height: 6' 0.5" (184.2 cm) Weight: 201 lb (91.173 kg) IBW/kg (Calculated) : 78.75   Vital Signs: Temp: 97.5 F (36.4 C) (02/03 1504) Temp src: Oral (02/03 1504) BP: 143/92 mmHg (02/03 1504) Pulse Rate: 66 (02/03 1504)   Microbiology: No results found for this or any previous visit (from the past 720 hour(s)).  Medical History: Past Medical History  Diagnosis Date  . ACNE ROSACEA 06/27/2009  . ACTINIC KERATOSIS, HEAD 04/18/2009  . Acute maxillary sinusitis 05/14/2010  . ALLERGIC RHINITIS 04/09/2007  . B12 DEFICIENCY 06/07/2007  . BACK PAIN WITH RADICULOPATHY 04/24/2008  . CHEST WALL PAIN, ACUTE 06/15/2009  . Chronic maxillary sinusitis 05/29/2008  . COLITIS 04/27/2009  . COLONIC POLYPS, HX OF 04/27/2009    tubular adenomas  . DERMATITIS, ATOPIC 10/12/2007  . DIVERTICULOSIS, COLON 04/27/2009  . ECCHYMOSES, SPONTANEOUS 06/27/2008  . Elevated sedimentation rate 05/02/2009  . ESOPHAGEAL STRICTURE 04/27/2009  . GASTRITIS, CHRONIC 04/27/2009  . HYPERLIPIDEMIA 03/13/2008  . HYPERTENSION 04/09/2007  . Irritable bowel syndrome 08/11/2007  . KIDNEY DISEASE 04/09/2007  . NECK PAIN 09/14/2008  . NEUROPATHY, IDIOPATHIC PERIPHERAL NEC 08/11/2007  . OSTEOARTHRITIS, WRIST, RIGHT 08/05/2010  . Cancer     skin    Medications:  Scheduled:   Infusions:  . sodium chloride    . vancomycin     Assessment: 77 yoM admitted 2/3 after cutting L index finger open on table saw. Noted multiple antibiotic allergies. Pharmacy has been consulted to dose vancomycin for surgical prophylaxis.  Antiinfectives 2/3 >>  vancomycin x1    Tmax: afebrile WBCs: unknown Renal: unknown  Microbiology No cultures have been drawn this admission   Goal of Therapy:  appropriate weight based dosing for surgical prophylaxis  Plan:  - vancomycin IV 1g x1 to be given NOW - no other vancomycin doses entered for this consult  - please reconsult for follow-up antibiotics if indicated - follow-up plans for post-op antibiotics  Thank you for the consult.  Johny Drilling, PharmD, BCPS Pager: [phone] Pharmacy: [phone] 12/20/2013 4:53 PM

## 2013-12-20 NOTE — Preoperative (Signed)
Beta Blockers   Reason not to administer Beta Blockers:Hold beta blocker due to other  Will give po BB in PACU

## 2013-12-20 NOTE — ED Provider Notes (Signed)
CSN: [id number]     Arrival date & time 12/20/13  1500 History  First MD Initiated Contact with Patient 12/20/13 1510     Chief Complaint  Patient presents with  . Laceration    Patient is a 78 y.o. male presenting with skin laceration. The history is provided by the patient.  Laceration Location:  Finger Finger laceration location:  L index finger Depth:  Through underlying tissue Bleeding: controlled with pressure   Injury mechanism: Pt was using a table saw and cut off a portion of his finger tip. Pain details:    Quality:  Dull   Severity:  Mild   Timing:  Constant   Subjective pain progression: Pt took tylenol.  He does not want any pain medications. Foreign body present:  No foreign bodies Tetanus status:  Up to date   Past Medical History  Diagnosis Date  . ACNE ROSACEA 06/27/2009  . ACTINIC KERATOSIS, HEAD 04/18/2009  . Acute maxillary sinusitis 05/14/2010  . ALLERGIC RHINITIS 04/09/2007  . B12 DEFICIENCY 06/07/2007  . BACK PAIN WITH RADICULOPATHY 04/24/2008  . CHEST WALL PAIN, ACUTE 06/15/2009  . Chronic maxillary sinusitis 05/29/2008  . COLITIS 04/27/2009  . COLONIC POLYPS, HX OF 04/27/2009    tubular adenomas  . DERMATITIS, ATOPIC 10/12/2007  . DIVERTICULOSIS, COLON 04/27/2009  . ECCHYMOSES, SPONTANEOUS 06/27/2008  . Elevated sedimentation rate 05/02/2009  . ESOPHAGEAL STRICTURE 04/27/2009  . GASTRITIS, CHRONIC 04/27/2009  . HYPERLIPIDEMIA 03/13/2008  . HYPERTENSION 04/09/2007  . Irritable bowel syndrome 08/11/2007  . KIDNEY DISEASE 04/09/2007  . NECK PAIN 09/14/2008  . NEUROPATHY, IDIOPATHIC PERIPHERAL NEC 08/11/2007  . OSTEOARTHRITIS, WRIST, RIGHT 08/05/2010  . Cancer     skin   Past Surgical History  Procedure Laterality Date  . Hernia repair    . Lamenectomy    . Lumbar fusion    . Tonsillectomy     Family History  Problem Relation Age of Onset  . Hernia Mother   . Heart disease Father   . COPD Father   . Colon cancer Neg Hx    History  Substance Use Topics    . Smoking status: Former Smoker    Quit date: 11/17/2001  . Smokeless tobacco: Never Used  . Alcohol Use: No    Review of Systems  All other systems reviewed and are negative.    Allergies  Atorvastatin; Ciprofloxacin; Mycophenolate mofetil; Rosuvastatin; Amoxicillin; and Penicillins  Home Medications   Current Outpatient Rx  Name  Route  Sig  Dispense  Refill  . acetaminophen (TYLENOL) 500 MG tablet   Oral   Take 500 mg by mouth every 6 (six) hours as needed.           . cyanocobalamin (,VITAMIN B-12,) 1000 MCG/ML injection      Inject 1.40ml every 3 weeks   10 mL   3     Pt states you will mail to him   . diphenoxylate-atropine (LOMOTIL) 2.5-0.025 MG per tablet   Oral   Take 1 tablet by mouth 4 (four) times daily as needed.   60 tablet   4   . fish oil-omega-3 fatty acids 1000 MG capsule   Oral   Take 1 g by mouth 2 (two) times daily.          . fluticasone (FLONASE) 50 MCG/ACT nasal spray   Each Nare   Place 1 spray into both nostrils daily.         [name] Kitchen ibuprofen (ADVIL,MOTRIN) 200 MG tablet  Oral   Take 200 mg by mouth every 6 (six) hours as needed for moderate pain.         . indapamide (LOZOL) 1.25 MG tablet   Oral   Take 1.25 mg by mouth daily.          . multivitamin-iron-minerals-folic acid (CENTRUM) chewable tablet   Oral   Chew 1 tablet by mouth daily.         . nadolol (CORGARD) 20 MG tablet   Oral   Take 20 mg by mouth 2 (two) times daily.         . Red Yeast Rice 600 MG CAPS   Oral   Take 1 capsule by mouth 2 (two) times daily.          [name] Kitchen telmisartan (MICARDIS) 80 MG tablet   Oral   Take 1 tablet (80 mg total) by mouth daily.   90 tablet   3   . temazepam (RESTORIL) 15 MG capsule   Oral   Take 15 mg by mouth at bedtime as needed for sleep.          BP 143/92  Pulse 66  Temp(Src) 97.5 F (36.4 C) (Oral)  Resp 18  Ht 6' 0.5" (1.842 m)  Wt 201 lb (91.173 kg)  BMI 26.87 kg/m2  SpO2 95% Physical Exam   Nursing note and vitals reviewed. Constitutional: He appears well-developed and well-nourished. No distress.  HENT:  Head: Normocephalic and atraumatic.  Right Ear: External ear normal.  Left Ear: External ear normal.  Eyes: Conjunctivae are normal. Right eye exhibits no discharge. Left eye exhibits no discharge. No scleral icterus.  Neck: Neck supple. No tracheal deviation present.  Cardiovascular: Normal rate.   Pulmonary/Chest: Effort normal. No stridor. No respiratory distress.  Musculoskeletal: He exhibits no edema.       Left hand: He exhibits tenderness and laceration. He exhibits normal two-point discrimination. Normal sensation noted. Normal strength noted.  Nailbed completely removed, please see photograph,  Laceration on dorsal aspect of distal left index finger tip, tissue and nailbed completely lacerated from the finger  Neurological: He is alert. Cranial nerve deficit: no gross deficits.  Skin: Skin is warm and dry. No rash noted.  Psychiatric: He has a normal mood and affect.      ED Course  Procedures (including critical care time) Labs Review Labs Reviewed  POCT I-STAT, CHEM 8   Imaging Review Dg Hand Complete Left  12/20/2013   CLINICAL DATA:  Laceration distal and of left index finger with pain  EXAM: LEFT HAND - COMPLETE 3+ VIEW  COMPARISON:  None.  FINDINGS: There is a prominent soft tissue defect along the radial aspect at the tip of the second finger. There is a hairline lucency through the tuft of the distal phalanx suggesting nondisplaced fracture.  IMPRESSION: Soft tissue defect extends down to bone and there appears to be a nondisplaced fracture through the tuft of the distal phalanx.   Electronically Signed   By: Skipper Cliche M.D.   On: 12/20/2013 15:44   Medications  0.9 %  sodium chloride infusion ( Intravenous New Bag/Given 12/20/13 1704)  vancomycin (VANCOCIN) IVPB 1000 mg/200 mL premix (1,000 mg Intravenous New Bag/Given 12/20/13 1704)  Vancomycin per  pharmacy dosing ordered  MDM   1. Fracture of finger, left, open     Pt has an open fracture with a significant tissue defect overlying the fracture.  Discussed with Dr Fredna Dow.  Will keep patient NPO.  Will  give dose of vancomycin.  Plan on operative repair.   Kathalene Frames, MD 12/20/13 [phone]

## 2013-12-20 NOTE — Discharge Instructions (Signed)

## 2013-12-20 NOTE — H&P (Signed)
Timothy Gallagher is an 78 y.o. male.   Chief Complaint: left index finger injury HPI: 78 yo rhd male states he hit left index finger on table saw blade this afternoon causing tissue loss to dorsum of finger.  Seen at [facility].  Reports no previous injury to finger other than stitches and no other injury at this time.  Past Medical History  Diagnosis Date  . ACNE ROSACEA 06/27/2009  . ACTINIC KERATOSIS, HEAD 04/18/2009  . Acute maxillary sinusitis 05/14/2010  . ALLERGIC RHINITIS 04/09/2007  . B12 DEFICIENCY 06/07/2007  . BACK PAIN WITH RADICULOPATHY 04/24/2008  . CHEST WALL PAIN, ACUTE 06/15/2009  . Chronic maxillary sinusitis 05/29/2008  . COLITIS 04/27/2009  . COLONIC POLYPS, HX OF 04/27/2009    tubular adenomas  . DERMATITIS, ATOPIC 10/12/2007  . DIVERTICULOSIS, COLON 04/27/2009  . ECCHYMOSES, SPONTANEOUS 06/27/2008  . Elevated sedimentation rate 05/02/2009  . ESOPHAGEAL STRICTURE 04/27/2009  . GASTRITIS, CHRONIC 04/27/2009  . HYPERLIPIDEMIA 03/13/2008  . HYPERTENSION 04/09/2007  . Irritable bowel syndrome 08/11/2007  . KIDNEY DISEASE 04/09/2007  . NECK PAIN 09/14/2008  . NEUROPATHY, IDIOPATHIC PERIPHERAL NEC 08/11/2007  . OSTEOARTHRITIS, WRIST, RIGHT 08/05/2010  . Cancer     skin    Past Surgical History  Procedure Laterality Date  . Hernia repair    . Lamenectomy    . Lumbar fusion    . Tonsillectomy      Family History  Problem Relation Age of Onset  . Hernia Mother   . Heart disease Father   . COPD Father   . Colon cancer Neg Hx    Social History:  reports that he quit smoking about 12 years ago. He has never used smokeless tobacco. He reports that he does not drink alcohol or use illicit drugs.  Allergies:  Allergies  Allergen Reactions  . Atorvastatin     REACTION: nausea and blurred vision  . Ciprofloxacin Swelling  . Mycophenolate Mofetil     REACTION: unspecified  . Rosuvastatin     Unknown   . Amoxicillin Rash  . Penicillins Rash     (Not in a hospital  admission)  Results for orders placed during the hospital encounter of 12/20/13 (from the past 48 hour(s))  POCT I-STAT, CHEM 8     Status: None   Collection Time    12/20/13  5:13 PM      Result Value Range   Sodium 142  137 - 147 mEq/L   Potassium 3.9  3.7 - 5.3 mEq/L   Chloride 103  96 - 112 mEq/L   BUN 22  6 - 23 mg/dL   Creatinine, Ser 1.00  0.50 - 1.35 mg/dL   Glucose, Bld 99  70 - 99 mg/dL   Calcium, Ion 1.23  1.13 - 1.30 mmol/L   TCO2 29  0 - 100 mmol/L   Hemoglobin 13.6  13.0 - 17.0 g/dL   HCT 40.0  39.0 - 52.0 %    Dg Hand Complete Left  12/20/2013   CLINICAL DATA:  Laceration distal and of left index finger with pain  EXAM: LEFT HAND - COMPLETE 3+ VIEW  COMPARISON:  None.  FINDINGS: There is a prominent soft tissue defect along the radial aspect at the tip of the second finger. There is a hairline lucency through the tuft of the distal phalanx suggesting nondisplaced fracture.  IMPRESSION: Soft tissue defect extends down to bone and there appears to be a nondisplaced fracture through the tuft of the distal phalanx.  Electronically Signed   By: Skipper Cliche M.D.   On: 12/20/2013 15:44     A comprehensive review of systems was negative.  Blood pressure 172/92, pulse 62, temperature 98.4 F (36.9 C), temperature source Oral, resp. rate 18, height 6' 0.5" (1.842 m), weight 201 lb (91.173 kg), SpO2 98.00%.  General appearance: alert, cooperative and appears stated age Head: Normocephalic, without obvious abnormality, atraumatic Neck: supple, symmetrical, trachea midline Resp: clear to auscultation bilaterally Cardio: regular rate and rhythm GI: non tender Extremities: intact sensation and capillary refill all digits. +epl/fpl/io.  soft tissue loss dorsum left index finger.  most of nail bed tissue gone.  volar tissue intact. bone exposed. Pulses: 2+ and symmetric Skin: Skin color, texture, turgor normal. No rashes or lesions Neurologic: Grossly  normal Incision/Wound: As above  Assessment/Plan Left index finger table saw injury.  Discussed options with patient and wife including removal of tuft to allow for soft tissue coverage over bone as well as option of skin substitute possibly followed by skin grafting.  Risks and benefits of each discussed.  He elected to proceed with revision amputation.  Risks, benefits, and alternatives of surgery were discussed and the patient agrees with the plan of care.   Dia Jefferys R 12/20/2013, 8:20 PM

## 2013-12-20 NOTE — Op Note (Signed)
[id number] 

## 2013-12-20 NOTE — Brief Op Note (Signed)
12/20/2013  9:20 PM  PATIENT:  Timothy Gallagher  78 y.o. male  PRE-OPERATIVE DIAGNOSIS:  LEFT INDEX FINGER LACERATION  POST-OPERATIVE DIAGNOSIS:  LEFT INDEX FINGER LACERATION  PROCEDURE:  Revision amputation left index finger  SURGEON:  Surgeon(s) and Role:    * Tennis Must, MD - Primary  PHYSICIAN ASSISTANT:   ASSISTANTS: none   ANESTHESIA:   local and general  EBL:     BLOOD ADMINISTERED:none  DRAINS: none   LOCAL MEDICATIONS USED:  MARCAINE    and LIDOCAINE   SPECIMEN:  No Specimen  DISPOSITION OF SPECIMEN:  N/A  COUNTS:  YES  TOURNIQUET:  * No tourniquets in log *  DICTATION: .Other Dictation: Dictation Number [id number]  PLAN OF CARE: Discharge to home after PACU  PATIENT DISPOSITION:  PACU - hemodynamically stable.

## 2013-12-20 NOTE — Anesthesia Preprocedure Evaluation (Addendum)
Anesthesia Evaluation  Patient identified by MRN, date of birth, ID band Patient awake    Reviewed: Allergy & Precautions, H&P , NPO status , Patient's Chart, lab work & pertinent test results  Airway Mallampati: II TM Distance: >3 FB Neck ROM: Full    Dental no notable dental hx.    Pulmonary neg pulmonary ROS, former smoker,  breath sounds clear to auscultation  Pulmonary exam normal       Cardiovascular hypertension, Pt. on medications Rhythm:Regular Rate:Normal     Neuro/Psych negative neurological ROS  negative psych ROS   GI/Hepatic negative GI ROS, Neg liver ROS,   Endo/Other  negative endocrine ROS  Renal/GU negative Renal ROS  negative genitourinary   Musculoskeletal negative musculoskeletal ROS (+)   Abdominal   Peds negative pediatric ROS (+)  Hematology negative hematology ROS (+)   Anesthesia Other Findings   Reproductive/Obstetrics negative OB ROS                          Anesthesia Physical Anesthesia Plan  ASA: II  Anesthesia Plan: MAC   Post-op Pain Management:    Induction: Intravenous  Airway Management Planned: Simple Face Mask  Additional Equipment:   Intra-op Plan:   Post-operative Plan:   Informed Consent: I have reviewed the patients History and Physical, chart, labs and discussed the procedure including the risks, benefits and alternatives for the proposed anesthesia with the patient or authorized representative who has indicated his/her understanding and acceptance.   Dental advisory given  Plan Discussed with: CRNA and Surgeon  Anesthesia Plan Comments: (Will take beta blocker po post-op)       Anesthesia Quick Evaluation

## 2013-12-21 ENCOUNTER — Telehealth: Payer: Self-pay | Admitting: Internal Medicine

## 2013-12-21 ENCOUNTER — Encounter (HOSPITAL_COMMUNITY): Payer: Self-pay | Admitting: Orthopedic Surgery

## 2013-12-21 NOTE — Op Note (Signed)
NAME:  Timothy Gallagher, Timothy Gallagher NO.:  [id number]  MEDICAL RECORD NO.:  [id number]  LOCATION:  [facility]                         FACILITY:  [facility]  PHYSICIAN:  Leanora Cover, MD        DATE OF BIRTH:  02/12/1936  DATE OF PROCEDURE:  12/20/2013 DATE OF DISCHARGE:  12/20/2013                              OPERATIVE REPORT   PREOPERATIVE DIAGNOSIS:  Left index finger table saw injury with loss of dorsal tissue.  POSTOPERATIVE DIAGNOSIS:  Left index finger table saw injury with loss of dorsal tissue.  PROCEDURE:  Revision amputation of left index finger.  SURGEON:  Leanora Cover, MD  ASSISTANT:  None.  ANESTHESIA:  General and local.  IV FLUIDS:  Per Anesthesia flow sheet.  ESTIMATED BLOOD LOSS:  Minimal.  COMPLICATIONS:  None.  SPECIMENS:  None.  TOURNIQUET TIME:  Penrose drain less than 30 minutes.  DISPOSITION:  Stable to PACU.  INDICATIONS:  Timothy Gallagher is a 78 year old, right-hand-dominant male who injured his left index finger on a table saw blade this afternoon.  He suffered loss of the dorsal tissues over the distal aspect of the distal phalanx.  He was seen in [facility] Emergency Department where radiographs were taken revealing tuft fracture.  I was consulted for management of injury.  He reports no previous injuries to the index finger.  No other injuries at this time.  We discussed treatment options including revision, amputation of the finger, allowing direct soft tissue closure versus Integra skin graft, skin substitute with subsequent skin grafting to maintain length.  He wished to proceed with revision amputation.  Risks, benefits and alternative of the procedure were discussed including risk of blood loss, infection, damage to nerves, vessels, tendons, ligaments, bone; failure of surgery; need for additional surgery, complications with wound healing, continued pain, and residual nail.  He voiced understanding of these risks and elected to  proceed.  OPERATIVE COURSE:  After being identified preoperatively by myself, the patient and I agreed upon procedure and site procedure.  Surgical site was marked.  The risks, benefits, and alternatives of surgery were reviewed and wished to proceed.  Surgical consent had been signed.  He had been given IV vancomycin in the emergency department.  He was transferred to the operating room and placed on the operating room table in supine position with the left upper extremity on arm board.  A digital block had been performed in preoperative holding.  The left upper extremity was prepped and draped in normal sterile orthopedic fashion.  Surgical pause was performed between surgeons, anesthesia, and operating staff, and all were in agreement as to the patient, procedure, and site of procedure.  Penrose drain was used as a tourniquet was up for less than 30 minutes.  Attempt to perform the procedure under digital block was made, though he was not able to hold still.  General anesthesia was induced by the anesthesiologist.  The clot was cleared from the wound. There was some nail bed tissue remaining both radially and ulnarly, and at the nail fold.  The remainder of the nail bed was missing.  The dorsum of the bone was missing as well.  It was  felt that revision amputation was appropriate.  The remaining nail bed and nail fold tissues were excised sharply with a knife.  Care was taken to try and ensure complete removal of terminal matrix.  The soft tissues were freed up around the distal aspect of the distal phalanx.  The bone was shortened using the bone cutters and rongeurs.  It was short enough to allow the volar tissues to cover the bone.  The edges were rounded. Bipolar electrocautery was used to obtain hemostasis.  The wound was copiously irrigated with sterile saline by bulb syringe.  A 5-0 Monocryl suture was then used in interrupted fashion to reapproximate the soft tissues.  Good  bony coverage was obtained.  A digital block was performed with 8 mL of 0.5% plain Marcaine to aid in postoperative analgesia.  The wound was dressed with sterile Xeroform, 4x4, and wrapped with a Coban dressing lightly.  An AlumaFoam splint was placed and wrapped lightly with the Coban dressing.  The Penrose drain had been removed.  The operative drapes were broken down.  The patient was awoken from anesthesia safely.  He was transferred back to stretcher and taken to PACU in stable condition.  I will see him back in the office in 1 week for postoperative followup.  I will give him Percocet 5/325, 1-2 p.o. q.6 hours p.r.n. pain, dispensed #30 and Bactrim DS 1 p.o. b.i.d. x7 days.     Leanora Cover, MD     KK/MEDQ  D:  12/20/2013  T:  12/21/2013  Job:  [id number]

## 2013-12-21 NOTE — Telephone Encounter (Signed)
Relevant patient education mailed to patient.  

## 2013-12-27 ENCOUNTER — Encounter: Payer: Self-pay | Admitting: Internal Medicine

## 2013-12-27 ENCOUNTER — Telehealth: Payer: Self-pay | Admitting: Internal Medicine

## 2013-12-27 ENCOUNTER — Ambulatory Visit (INDEPENDENT_AMBULATORY_CARE_PROVIDER_SITE_OTHER): Payer: Medicare Other | Admitting: Internal Medicine

## 2013-12-27 VITALS — BP 140/80 | HR 54 | Temp 97.5°F | Resp 20 | Ht 72.5 in | Wt 199.0 lb

## 2013-12-27 DIAGNOSIS — J309 Allergic rhinitis, unspecified: Secondary | ICD-10-CM

## 2013-12-27 DIAGNOSIS — R110 Nausea: Secondary | ICD-10-CM

## 2013-12-27 NOTE — Progress Notes (Signed)
Subjective:    Patient ID: Timothy Gallagher, male    DOB: [birth year], 78 y.o.   MRN: [id number]  HPI 78 year old patient who has a history of treated hypertension and allergic rhinitis. He presents with a two-day history of increasing sinus congestion associated with some nausea and postnasal drip. There's been no fever. He has had a partial amputation of his left index finger and is completing antibiotic therapy. He also has been using Percocet do to his injury.  Past Medical History  Diagnosis Date  . ACNE ROSACEA 06/27/2009  . ACTINIC KERATOSIS, HEAD 04/18/2009  . Acute maxillary sinusitis 05/14/2010  . ALLERGIC RHINITIS 04/09/2007  . B12 DEFICIENCY 06/07/2007  . BACK PAIN WITH RADICULOPATHY 04/24/2008  . CHEST WALL PAIN, ACUTE 06/15/2009  . Chronic maxillary sinusitis 05/29/2008  . COLITIS 04/27/2009  . COLONIC POLYPS, HX OF 04/27/2009    tubular adenomas  . DERMATITIS, ATOPIC 10/12/2007  . DIVERTICULOSIS, COLON 04/27/2009  . ECCHYMOSES, SPONTANEOUS 06/27/2008  . Elevated sedimentation rate 05/02/2009  . ESOPHAGEAL STRICTURE 04/27/2009  . GASTRITIS, CHRONIC 04/27/2009  . HYPERLIPIDEMIA 03/13/2008  . HYPERTENSION 04/09/2007  . Irritable bowel syndrome 08/11/2007  . KIDNEY DISEASE 04/09/2007  . NECK PAIN 09/14/2008  . NEUROPATHY, IDIOPATHIC PERIPHERAL NEC 08/11/2007  . OSTEOARTHRITIS, WRIST, RIGHT 08/05/2010  . Cancer     skin    History   Social History  . Marital Status: Married    Spouse Name: N/A    Number of Children: 4  . Years of Education: N/A   Occupational History  . retired     from [place]  . Smoking status: Former Smoker    Quit date: 11/17/2001  . Smokeless tobacco: Never Used  . Alcohol Use: No  . Drug Use: No  . Sexual Activity: Yes   Other Topics Concern  . Not on file   Social History Narrative  . No narrative on file    Past Surgical History  Procedure Laterality Date  . Hernia repair    . Lamenectomy    . Lumbar fusion     . Tonsillectomy    . Incision and drainage wound with foreign body removal Left 12/20/2013    Procedure: INCISION AND DRAINAGE LEFT INDEX FINGER;  Surgeon: Tennis Must, MD;  Location: [facility];  Service: Orthopedics;  Laterality: Left;    Family History  Problem Relation Age of Onset  . Hernia Mother   . Heart disease Father   . COPD Father   . Colon cancer Neg Hx     Allergies  Allergen Reactions  . Atorvastatin     REACTION: nausea and blurred vision  . Ciprofloxacin Swelling  . Mycophenolate Mofetil     REACTION: unspecified  . Rosuvastatin     Unknown   . Amoxicillin Rash  . Penicillins Rash    Current Outpatient Prescriptions on File Prior to Visit  Medication Sig Dispense Refill  . cyanocobalamin (,VITAMIN B-12,) 1000 MCG/ML injection Inject 1.76ml every 3 weeks  10 mL  3  . diphenoxylate-atropine (LOMOTIL) 2.5-0.025 MG per tablet Take 1 tablet by mouth 4 (four) times daily as needed.  60 tablet  4  . fish oil-omega-3 fatty acids 1000 MG capsule Take 1 g by mouth 2 (two) times daily.       . fluticasone (FLONASE) 50 MCG/ACT nasal spray Place 1 spray into both nostrils daily.      [name] Kitchen ibuprofen (ADVIL,MOTRIN) 200 MG tablet Take 200 mg by  mouth every 6 (six) hours as needed for moderate pain.      . indapamide (LOZOL) 1.25 MG tablet Take 1.25 mg by mouth daily.       . multivitamin-iron-minerals-folic acid (CENTRUM) chewable tablet Chew 1 tablet by mouth daily.      . nadolol (CORGARD) 20 MG tablet Take 20 mg by mouth 2 (two) times daily.      [name] Kitchen oxyCODONE-acetaminophen (PERCOCET) 5-325 MG per tablet 1-2 tabs po q6 hours prn pain  30 tablet  0  . Red Yeast Rice 600 MG CAPS Take 1 capsule by mouth 2 (two) times daily.       [name] Kitchen telmisartan (MICARDIS) 80 MG tablet Take 1 tablet (80 mg total) by mouth daily.  90 tablet  3  . temazepam (RESTORIL) 15 MG capsule Take 15 mg by mouth at bedtime as needed for sleep.      [name] Kitchen sulfamethoxazole-trimethoprim (BACTRIM DS) 800-160 MG per tablet  Take 1 tablet by mouth 2 (two) times daily.  14 tablet  0   No current facility-administered medications on file prior to visit.    BP 140/80  Pulse 54  Temp(Src) 97.5 F (36.4 C) (Oral)  Resp 20  Ht 6' 0.5" (1.842 m)  Wt 199 lb (90.266 kg)  BMI 26.60 kg/m2  SpO2 96%       Review of Systems  Constitutional: Negative for fever, chills, appetite change and fatigue.  HENT: Positive for postnasal drip, rhinorrhea and sinus pressure. Negative for congestion, dental problem, ear pain, hearing loss, sore throat, tinnitus, trouble swallowing and voice change.   Eyes: Negative for pain, discharge and visual disturbance.  Respiratory: Negative for cough, chest tightness, wheezing and stridor.   Cardiovascular: Negative for chest pain, palpitations and leg swelling.  Gastrointestinal: Positive for nausea. Negative for vomiting, abdominal pain, diarrhea, constipation, blood in stool and abdominal distention.  Genitourinary: Negative for urgency, hematuria, flank pain, discharge, difficulty urinating and genital sores.  Musculoskeletal: Negative for arthralgias, back pain, gait problem, joint swelling, myalgias and neck stiffness.  Skin: Negative for rash.  Neurological: Negative for dizziness, syncope, speech difficulty, weakness, numbness and headaches.  Hematological: Negative for adenopathy. Does not bruise/bleed easily.  Psychiatric/Behavioral: Negative for behavioral problems and dysphoric mood. The patient is not nervous/anxious.        Objective:   Physical Exam  Constitutional: He is oriented to person, place, and time. He appears well-developed.  HENT:  Head: Normocephalic.  Right Ear: External ear normal.  Left Ear: External ear normal.  Eyes: Conjunctivae and EOM are normal.  Neck: Normal range of motion.  Cardiovascular: Normal rate and normal heart sounds.   Pulmonary/Chest: Breath sounds normal.  Abdominal: Bowel sounds are normal.  Musculoskeletal: Normal range of  motion. He exhibits no edema and no tenderness.  Neurological: He is alert and oriented to person, place, and time.  Psychiatric: He has a normal mood and affect. His behavior is normal.          Assessment & Plan:   Allergic rhinitis. We'll continue to take is on nasal spray and had the sinus irrigation. In view of the copious drainage but may be a factor with the nausea we'll consider a nonsedating antihistamine. Hypertension stable Nausea possibly related to excessive postnasal drip. Possible adverse drug effect 2 oxycodone  and and less likely Bactrim

## 2013-12-27 NOTE — Patient Instructions (Signed)
Acute sinusitis symptoms for less than 10 days are generally not helped by antibiotic therapy.  Use saline irrigation, warm  moist compresses and over-the-counter decongestants only as directed.  Call if there is no improvement in 5 to 7 days, or sooner if you develop increasing pain, fever, or any new symptoms.  Take your antibiotic as prescribed until ALL of it is gone, but stop if you develop a rash, swelling, or any side effects of the medication.  Contact our office as soon as possible if  there are side effects of the medication.

## 2013-12-27 NOTE — Telephone Encounter (Signed)
Patient Information:  Caller Name: B[place]  Phone: [phone]  Patient: Timothy Gallagher, Timothy Gallagher  Gender: Male  DOB: Jan 29, 1936  Age: 78 Years  PCP: Benay Pillow (Adults only)  Office Follow Up:  Does the office need to follow up with this patient?: No  Instructions For The Office: N/A   Symptoms  Reason For Call & Symptoms: Ate pizza Sunday 2/8, soon afterward noting nausea.  Started sneezing with head cold symptoms by evening.  Dizziness started yesterday 2/9. Feels like room spinning at times, worse when up.  Reviewed Health History In EMR: Yes  Reviewed Medications In EMR: Yes  Reviewed Allergies In EMR: Yes  Reviewed Surgeries / Procedures: Yes  Date of Onset of Symptoms: 12/25/2013  Guideline(s) Used:  Dizziness  Disposition Per Guideline:   Go to Office Now  Reason For Disposition Reached:   Spinning or tilting sensation (vertigo) present now  Advice Given:  N/A  Patient Will Follow Care Advice:  YES  Appointment Scheduled:  12/27/2013 09:30:00 Appointment Scheduled Provider:  Carolann Littler (Family Practice)

## 2013-12-27 NOTE — Progress Notes (Signed)
Pre-visit discussion using our clinic review tool. No additional management support is needed unless otherwise documented below in the visit note.  

## 2013-12-30 ENCOUNTER — Ambulatory Visit: Payer: Medicare Other | Admitting: Internal Medicine

## 2014-01-02 ENCOUNTER — Ambulatory Visit (INDEPENDENT_AMBULATORY_CARE_PROVIDER_SITE_OTHER): Payer: Medicare Other | Admitting: Internal Medicine

## 2014-01-02 DIAGNOSIS — D518 Other vitamin B12 deficiency anemias: Secondary | ICD-10-CM

## 2014-01-02 DIAGNOSIS — D519 Vitamin B12 deficiency anemia, unspecified: Secondary | ICD-10-CM

## 2014-01-02 MED ORDER — CYANOCOBALAMIN 1000 MCG/ML IJ SOLN
1000.0000 ug | INTRAMUSCULAR | Status: AC
  Administered 2014-01-02: 1000 ug via INTRAMUSCULAR

## 2014-01-18 ENCOUNTER — Ambulatory Visit: Payer: Medicare Other | Admitting: Internal Medicine

## 2014-01-25 ENCOUNTER — Ambulatory Visit (INDEPENDENT_AMBULATORY_CARE_PROVIDER_SITE_OTHER): Payer: Medicare Other | Admitting: Internal Medicine

## 2014-01-25 ENCOUNTER — Encounter: Payer: Self-pay | Admitting: Internal Medicine

## 2014-01-25 VITALS — BP 170/90 | HR 60 | Temp 98.0°F | Ht 72.5 in | Wt 201.0 lb

## 2014-01-25 DIAGNOSIS — I10 Essential (primary) hypertension: Secondary | ICD-10-CM

## 2014-01-25 DIAGNOSIS — E538 Deficiency of other specified B group vitamins: Secondary | ICD-10-CM

## 2014-01-25 MED ORDER — INDAPAMIDE 2.5 MG PO TABS: 2.5000 mg | ORAL_TABLET | Freq: Every day | ORAL | Status: DC

## 2014-01-25 NOTE — Progress Notes (Signed)
   Subjective:    Patient ID: Timothy Gallagher, male    DOB: 1936-02-09, 78 y.o.   MRN: [id number]  HPI HTN The patients blood pressure has been elevated in the AM    Review of Systems     Objective:   Physical Exam        Assessment & Plan:

## 2014-01-25 NOTE — Patient Instructions (Addendum)
The patient is instructed to continue all medications as prescribed. Schedule followup with check out clerk upon leaving the clinic CHANGE THE [place]

## 2014-01-25 NOTE — Progress Notes (Signed)
Pre visit review using our clinic review tool, if applicable. No additional management support is needed unless otherwise documented below in the visit note. 

## 2014-01-26 MED ORDER — CYANOCOBALAMIN 1000 MCG/ML IJ SOLN
1000.0000 ug | Freq: Once | INTRAMUSCULAR | Status: AC
  Administered 2014-01-25: 1000 ug via INTRAMUSCULAR

## 2014-01-26 NOTE — Addendum Note (Signed)
Addended by: Townsend Roger D on: 01/26/2014 12:42 PM   Modules accepted: Orders

## 2014-01-31 ENCOUNTER — Ambulatory Visit (INDEPENDENT_AMBULATORY_CARE_PROVIDER_SITE_OTHER): Payer: Medicare Other | Admitting: Family Medicine

## 2014-01-31 ENCOUNTER — Encounter: Payer: Self-pay | Admitting: Family Medicine

## 2014-01-31 VITALS — BP 138/80 | Temp 97.7°F | Wt 199.0 lb

## 2014-01-31 DIAGNOSIS — J309 Allergic rhinitis, unspecified: Secondary | ICD-10-CM

## 2014-01-31 NOTE — Progress Notes (Signed)
Chief Complaint  Patient presents with  . sinus drainage    using dymista     HPI:  Chronic allergy issues -started: several months ago but occurs chronically for many years -symptoms:nasal congestion, PND, sneezing -denies:fever, SOB, NVD, tooth pain -has tried: reports saw PCP for this 1 week ago and started dymista which is helping a little, reports he can not take antihistamines as make his BP go up -sick contacts/travel/risks: denies flu exposure or Ebola risks -Hx of: allergies and allergy testing many years ago  ROS: See pertinent positives and negatives per HPI.  Past Medical History  Diagnosis Date  . ACNE ROSACEA 06/27/2009  . ACTINIC KERATOSIS, HEAD 04/18/2009  . Acute maxillary sinusitis 05/14/2010  . ALLERGIC RHINITIS 04/09/2007  . B12 DEFICIENCY 06/07/2007  . BACK PAIN WITH RADICULOPATHY 04/24/2008  . CHEST WALL PAIN, ACUTE 06/15/2009  . Chronic maxillary sinusitis 05/29/2008  . COLITIS 04/27/2009  . COLONIC POLYPS, HX OF 04/27/2009    tubular adenomas  . DERMATITIS, ATOPIC 10/12/2007  . DIVERTICULOSIS, COLON 04/27/2009  . ECCHYMOSES, SPONTANEOUS 06/27/2008  . Elevated sedimentation rate 05/02/2009  . ESOPHAGEAL STRICTURE 04/27/2009  . GASTRITIS, CHRONIC 04/27/2009  . HYPERLIPIDEMIA 03/13/2008  . HYPERTENSION 04/09/2007  . Irritable bowel syndrome 08/11/2007  . KIDNEY DISEASE 04/09/2007  . NECK PAIN 09/14/2008  . NEUROPATHY, IDIOPATHIC PERIPHERAL NEC 08/11/2007  . OSTEOARTHRITIS, WRIST, RIGHT 08/05/2010  . Cancer     skin    Past Surgical History  Procedure Laterality Date  . Hernia repair    . Lamenectomy    . Lumbar fusion    . Tonsillectomy    . Incision and drainage wound with foreign body removal Left 12/20/2013    Procedure: INCISION AND DRAINAGE LEFT INDEX FINGER;  Surgeon: Tennis Must, MD;  Location: [facility];  Service: Orthopedics;  Laterality: Left;    Family History  Problem Relation Age of Onset  . Hernia Mother   . Heart disease Father   . COPD Father    . Colon cancer Neg Hx     History   Social History  . Marital Status: Married    Spouse Name: N/A    Number of Children: 4  . Years of Education: N/A   Occupational History  . retired     from [employer]  . Smoking status: Former Smoker    Quit date: 11/17/2001  . Smokeless tobacco: Never Used  . Alcohol Use: No  . Drug Use: No  . Sexual Activity: Yes   Other Topics Concern  . None   Social History Narrative  . None    Current outpatient prescriptions:cyanocobalamin (,VITAMIN B-12,) 1000 MCG/ML injection, Inject 1.43ml every 3 weeks, Disp: 10 mL, Rfl: 3;  diphenoxylate-atropine (LOMOTIL) 2.5-0.025 MG per tablet, Take 1 tablet by mouth 4 (four) times daily as needed., Disp: 60 tablet, Rfl: 4;  fexofenadine (ALLEGRA) 180 MG tablet, Take 180 mg by mouth daily., Disp: , Rfl:  fish oil-omega-3 fatty acids 1000 MG capsule, Take 1 g by mouth 2 (two) times daily. , Disp: , Rfl: ;  fluticasone (FLONASE) 50 MCG/ACT nasal spray, Place 1 spray into both nostrils daily., Disp: , Rfl: ;  ibuprofen (ADVIL,MOTRIN) 200 MG tablet, Take 200 mg by mouth every 6 (six) hours as needed for moderate pain., Disp: , Rfl: ;  indapamide (LOZOL) 2.5 MG tablet, Take 1 tablet (2.5 mg total) by mouth daily., Disp: 30 tablet, Rfl: 11 multivitamin-iron-minerals-folic acid (CENTRUM) chewable tablet, Chew 1  tablet by mouth daily., Disp: , Rfl: ;  nadolol (CORGARD) 20 MG tablet, Take 20 mg by mouth 2 (two) times daily., Disp: , Rfl: ;  oxyCODONE-acetaminophen (PERCOCET) 5-325 MG per tablet, 1-2 tabs po q6 hours prn pain, Disp: 30 tablet, Rfl: 0;  Red Yeast Rice 600 MG CAPS, Take 1 capsule by mouth 2 (two) times daily. , Disp: , Rfl:  sulfamethoxazole-trimethoprim (BACTRIM DS) 800-160 MG per tablet, Take 1 tablet by mouth 2 (two) times daily., Disp: 14 tablet, Rfl: 0;  tamsulosin (FLOMAX) 0.4 MG CAPS capsule, Take 1 capsule by mouth daily., Disp: , Rfl: ;  telmisartan (MICARDIS) 80 MG tablet,  Take 1 tablet (80 mg total) by mouth daily., Disp: 90 tablet, Rfl: 3;  temazepam (RESTORIL) 15 MG capsule, Take 15 mg by mouth at bedtime as needed for sleep., Disp: , Rfl:   EXAM:  Filed Vitals:   01/31/14 1115  BP: 138/80  Temp: 97.7 F (36.5 C)    Body mass index is 26.6 kg/(m^2).  GENERAL: vitals reviewed and listed above, alert, oriented, appears well hydrated and in no acute distress  HEENT: atraumatic, conjunttiva clear, no obvious abnormalities on inspection of external nose and ears, normal appearance of ear canals and TMs, clear nasal congestion, mild post oropharyngeal erythema with PND, no tonsillar edema or exudate, no sinus TTP  NECK: no obvious masses on inspection  LUNGS: clear to auscultation bilaterally, no wheezes, rales or rhonchi, good air movement  CV: HRRR, no peripheral edema  MS: moves all extremities without noticeable abnormality  PSYCH: pleasant and cooperative, no obvious depression or anxiety  ASSESSMENT AND PLAN:  Discussed the following assessment and plan:  ALLERGIC RHINITIS  -given HPI and exam findings today, a serious infection or illness is unlikely. We discussed potential etiologies, with allergic rhinitis being most likely, and advised continue tx and follow up with allergist if symptoms persist. We discussed treatment side effects, likely course, antibiotic misuse and signs of developing a serious illness. -of course, we advised to return or notify a doctor immediately if symptoms worsen or persist or new concerns arise.    There are no Patient Instructions on file for this visit.   Timothy Benton R.

## 2014-01-31 NOTE — Progress Notes (Signed)
Pre visit review using our clinic review tool, if applicable. No additional management support is needed unless otherwise documented below in the visit note. 

## 2014-02-06 ENCOUNTER — Telehealth: Payer: Self-pay | Admitting: Internal Medicine

## 2014-02-06 NOTE — Telephone Encounter (Signed)
Patient Information:  Caller Name: Bayani  Phone: [phone]  Patient: Timothy Gallagher, Timothy Gallagher  Gender: Male  DOB: 13-Mar-1936  Age: 78 Years  PCP: Benay Pillow (Adults only)  Office Follow Up:  Does the office need to follow up with this patient?: Yes  Instructions For The Office: Please read RN note  RN Note:  Allergies w/ Post Nasal drip, onset 1 month, w/ Dizziness, onset 3-23.  Pt has been taking Loratadine for 1 month, Pt given sample of Dymista, nasal spray, at last OV, some improvement. Pt contines to have post nasal drip during the night.  Pt has hx of Dizziness, took prescribed Meclizine, some improvement.  Pt on 3 different BP meds. BP 161/84, P 64. Pt states he took all HTN meds 30 mins prior to traige. Please review dizziness compliants w/ Dr Arnoldo Morale to see if MD wants to re-adjust meds and Pt would also like to know if there is anything he can take before bed to help w/ post nasal drip, Benadryl?   Discussed fluids to help w/ dizziness.  Symptoms  Reason For Call & Symptoms: Allergies w/ Post Nasal drip, onset 1 month w/ new onset Dizziness, onset 3-23.[name] Kitchen  Reviewed Health History In EMR: Yes  Reviewed Medications In EMR: Yes  Reviewed Allergies In EMR: Yes  Reviewed Surgeries / Procedures: Yes  Date of Onset of Symptoms: 01/03/2014  Treatments Tried: Loratadine and Dymista  Treatments Tried Worked: No  Guideline(s) Used:  Dizziness  Disposition Per Guideline:   Discuss with PCP and Callback by Nurse Today  Reason For Disposition Reached:   Taking a medicine that could cause dizziness (e.g., blood pressure medications, diuretics)  Advice Given:  N/A  Patient Will Follow Care Advice:  YES

## 2014-02-06 NOTE — Telephone Encounter (Signed)
zrytec 10 mg BID for 14 days

## 2014-02-07 ENCOUNTER — Other Ambulatory Visit: Payer: Self-pay | Admitting: Internal Medicine

## 2014-02-07 NOTE — Telephone Encounter (Signed)
Pt aware.

## 2014-02-15 ENCOUNTER — Ambulatory Visit: Payer: Medicare Other | Admitting: Internal Medicine

## 2014-02-24 ENCOUNTER — Telehealth: Payer: Self-pay | Admitting: Internal Medicine

## 2014-02-24 ENCOUNTER — Other Ambulatory Visit: Payer: Self-pay

## 2014-02-24 MED ORDER — INDAPAMIDE 2.5 MG PO TABS: 2.5000 mg | ORAL_TABLET | Freq: Every day | ORAL | Status: DC

## 2014-02-24 NOTE — Telephone Encounter (Signed)
[facility] [place], [place] is requesting a re-fill on indapamide (LOZOL) 2.5 MG tablet

## 2014-02-24 NOTE — Telephone Encounter (Signed)
rx for lozol 2.5 mg [facility]

## 2014-03-06 ENCOUNTER — Encounter: Payer: Self-pay | Admitting: Internal Medicine

## 2014-03-06 ENCOUNTER — Telehealth: Payer: Self-pay | Admitting: Internal Medicine

## 2014-03-06 ENCOUNTER — Ambulatory Visit: Payer: Medicare Other | Admitting: Internal Medicine

## 2014-03-06 ENCOUNTER — Ambulatory Visit (INDEPENDENT_AMBULATORY_CARE_PROVIDER_SITE_OTHER): Payer: Medicare Other | Admitting: Internal Medicine

## 2014-03-06 VITALS — BP 136/78 | HR 60 | Ht 71.75 in | Wt 196.0 lb

## 2014-03-06 DIAGNOSIS — K589 Irritable bowel syndrome without diarrhea: Secondary | ICD-10-CM

## 2014-03-06 MED ORDER — DIPHENOXYLATE-ATROPINE 2.5-0.025 MG PO TABS: 1.0000 | ORAL_TABLET | Freq: Four times a day (QID) | ORAL | Status: DC | PRN

## 2014-03-06 NOTE — Progress Notes (Signed)
         Subjective:    Patient ID: Timothy Gallagher, male    DOB: 02-15-1936, 78 y.o.   MRN: [id number]  HPI Patient is a very nice elderly man known to Dr. Sharlett Iles with a history of IBS and some episodic diarrhea problems and lower quadrant abdominal pain. He was coming in for a routine followup to establish with me after Dr. Buel Ream retirement, and yesterday he developed nausea vomiting and diarrhea after having lower abdominal pain in the middle the night Saturday. There were no sick contacts though his daughter did say there was some sort of virus going around. He has not eaten out. He feels much better today was able to tolerate toast and hard bullet eggs for breakfast.  Medications, allergies, past medical history, past surgical history, family history and social history are reviewed and updated in the EMR.  Review of Systems As above    Objective:   Physical Exam General:  NAD Eyes:   anicteric Lungs:  clear Heart:  S1S2 no rubs, murmurs or gallops Abdomen:  soft and nontender, BS+, no hernia - RIH scar noted Ext:   no edema  Data Reviewed:  Dr. Buel Ream notes, prior colonoscopies    Assessment & Plan:   1. Irritable bowel syndrome    I think this current problem could be an exacerbation of IBS versus a possible partial bowel obstruction to his prior hernia surgery. Beverages he seems to be better. He is asking for refill on Lomotil which I will give him today and I will see him back as needed and for routine endoscopic evaluation is planned.

## 2014-03-06 NOTE — Telephone Encounter (Signed)
Patient Information:  Caller Name: Timothy Gallagher  Phone: [phone]  Patient: Timothy Gallagher, Timothy Gallagher  Gender: Male  DOB: 1936/01/30  Age: 78 Years  PCP: Benay Pillow (Adults only)  Office Follow Up:  Does the office need to follow up with this patient?: No  Instructions For The Office: N/A  RN Note:  Advised pt to stay well hydrated, rest and continue to monitor BP. Also advised to take BP med tonight if BP stays stable and call  back if any worsening sxs.  Symptoms  Reason For Call & Symptoms: Calling because he felt weak and lightheaded earlier and BP was 96/52, now BP 110/66 and he feels better. Took 2 of his BP meds and wants to know if he should take the other one. Still able to do daily activities. Had a "bug" this weekend--vomiting and diarrhea. Went to see GI doctor this am for scheduled appt and BP was good. Has an appt at the office on 03/10/14.  Reviewed Health History In EMR: Yes  Reviewed Medications In EMR: Yes  Reviewed Allergies In EMR: Yes  Reviewed Surgeries / Procedures: Yes  Date of Onset of Symptoms: 03/06/2014  Guideline(s) Used:  Weakness (Generalized) and Fatigue  Disposition Per Guideline:   See Within 3 Days in Office  Reason For Disposition Reached:   Mild weakness (i.e., does not interfere with ability to work, go to school, normal activities) and persists > 1 week  Advice Given:  Reassurance  Weakness often accompanies viral illnesses (e.g., colds and flu).  The weakness is usually worse the first 3 days of the illness, then gets better.  Call Back If:  You become worse.  Reassurance  Not drinking enough fluids and being a little dehydrated is a common cause of mild weakness.  Fluids  : Drink several glasses of fruit juice, other clear fluids, or water. This will improve hydration and blood glucose.  Rest  : Lie down with feet elevated for 1 hour. This will improve blood flow and increase blood flow to the brain.  Call Back If:   You become  worse.  Patient Will Follow Care Advice:  YES

## 2014-03-06 NOTE — Patient Instructions (Addendum)
Today we have faxed a rx for [facility]otil to [facility] for you to pick up.  Follow up with [place] as needed.   I appreciate the opportunity to care for you.

## 2014-03-10 ENCOUNTER — Ambulatory Visit (INDEPENDENT_AMBULATORY_CARE_PROVIDER_SITE_OTHER): Payer: Medicare Other | Admitting: Internal Medicine

## 2014-03-10 DIAGNOSIS — E538 Deficiency of other specified B group vitamins: Secondary | ICD-10-CM

## 2014-03-10 MED ORDER — CYANOCOBALAMIN 1000 MCG/ML IJ SOLN
1000.0000 ug | Freq: Once | INTRAMUSCULAR | Status: AC
  Administered 2014-03-10: 1000 ug via INTRAMUSCULAR

## 2014-03-13 ENCOUNTER — Ambulatory Visit: Payer: Medicare Other | Admitting: Internal Medicine

## 2014-03-13 ENCOUNTER — Telehealth: Payer: Self-pay

## 2014-03-13 NOTE — Telephone Encounter (Signed)
**Note Timothy-Identified via Obfuscation** Spoke to Timothy Gallagher at [facility] # [phone] to check on the status of non-formulary drug request form (for generic Lomotil) that we faxed to them on 03/06/14 after the patient was seen.  He said it's been approved from 03/06/14 thru 03/07/15.  I will send the form to be scanned in.  I also notified [facility] of this information.

## 2014-03-15 ENCOUNTER — Telehealth: Payer: Self-pay | Admitting: Internal Medicine

## 2014-03-15 NOTE — Telephone Encounter (Signed)
[facility] [place], [place] is requesting 90 day re-fill on indapamide (LOZOL) 2.5 MG tablet

## 2014-03-16 MED ORDER — INDAPAMIDE 2.5 MG PO TABS: 2.5000 mg | ORAL_TABLET | Freq: Every day | ORAL | Status: DC

## 2014-03-20 MED ORDER — AZITHROMYCIN 250 MG PO TABS: ORAL_TABLET | ORAL | Status: DC

## 2014-03-20 NOTE — Telephone Encounter (Signed)
Patient Information:  Caller Name: Jonavin  Phone: [phone]  Patient: Timothy Gallagher, Timothy Gallagher  Gender: Male  DOB: [birth year]  Age: 78 Years  PCP: Benay Pillow (Adults only)  Office Follow Up:  Does the office need to follow up with this patient?: Yes  Instructions For The Office: Requesting antibiotic  RN Note:  patient calling regarding cold and congestion.  Nasal drainage was clear, but now has turned green.  Requesting consideration of antibiotic [facility] @ [phone].  Desires call back if this cannot be done.  Symptoms  Reason For Call & Symptoms: c/o congestion, fatigue, runny nose  Reviewed Health History In EMR: Yes  Reviewed Medications In EMR: Yes  Reviewed Allergies In EMR: Yes  Reviewed Surgeries / Procedures: Yes  Date of Onset of Symptoms: 03/15/2014  Treatments Tried: saline washes, Rx nasal spray  Treatments Tried Worked: No  Guideline(s) Used:  Colds  Disposition Per Guideline:   See Today or Tomorrow in Office  Reason For Disposition Reached:   Using nasal washes and pain medicine > 24 hours and sinus pain (lower forehead, cheekbone, or eye) persists  Advice Given:  N/A  Patient Refused Recommendation:  Patient Requests Prescription  Requesting antibiotic

## 2014-03-20 NOTE — Addendum Note (Signed)
Addended by: Colleen Can on: 03/20/2014 09:53 AM   Modules accepted: Orders

## 2014-03-20 NOTE — Telephone Encounter (Signed)
Ok per Dr. Arnoldo Morale to call in [place].

## 2014-03-20 NOTE — Telephone Encounter (Signed)
[facility]

## 2014-03-20 NOTE — Telephone Encounter (Signed)
Pt is aware.  

## 2014-03-31 ENCOUNTER — Ambulatory Visit (INDEPENDENT_AMBULATORY_CARE_PROVIDER_SITE_OTHER): Payer: Medicare Other | Admitting: Internal Medicine

## 2014-03-31 DIAGNOSIS — E538 Deficiency of other specified B group vitamins: Secondary | ICD-10-CM

## 2014-03-31 MED ORDER — CYANOCOBALAMIN 1000 MCG/ML IJ SOLN
1000.0000 ug | Freq: Once | INTRAMUSCULAR | Status: AC
  Administered 2014-03-31: 1000 ug via INTRAMUSCULAR

## 2014-04-03 ENCOUNTER — Ambulatory Visit (INDEPENDENT_AMBULATORY_CARE_PROVIDER_SITE_OTHER)
Admission: RE | Admit: 2014-04-03 | Discharge: 2014-04-03 | Disposition: A | Discharge status: Home / Self Care | Payer: Medicare Other | Source: Ambulatory Visit | Attending: Physician Assistant | Admitting: Physician Assistant

## 2014-04-03 ENCOUNTER — Ambulatory Visit (INDEPENDENT_AMBULATORY_CARE_PROVIDER_SITE_OTHER): Payer: Medicare Other | Admitting: Physician Assistant

## 2014-04-03 ENCOUNTER — Encounter: Payer: Self-pay | Admitting: Physician Assistant

## 2014-04-03 VITALS — BP 130/80 | HR 63 | Temp 97.8°F | Resp 18 | Wt 197.0 lb

## 2014-04-03 DIAGNOSIS — R0989 Other specified symptoms and signs involving the circulatory and respiratory systems: Secondary | ICD-10-CM

## 2014-04-03 DIAGNOSIS — R5383 Other fatigue: Secondary | ICD-10-CM

## 2014-04-03 DIAGNOSIS — R0602 Shortness of breath: Secondary | ICD-10-CM

## 2014-04-03 DIAGNOSIS — R5381 Other malaise: Secondary | ICD-10-CM

## 2014-04-03 LAB — CBC WITH DIFFERENTIAL/PLATELET
Basophils Absolute: 0 10*3/uL (ref 0.0–0.1)
Basophils Relative: 0.4 % (ref 0.0–3.0)
Eosinophils Absolute: 0.2 10*3/uL (ref 0.0–0.7)
Eosinophils Relative: 3.6 % (ref 0.0–5.0)
HCT: 38.7 % — ABNORMAL LOW (ref 39.0–52.0)
Hemoglobin: 12.8 g/dL — ABNORMAL LOW (ref 13.0–17.0)
Lymphocytes Relative: 37.6 % (ref 12.0–46.0)
Lymphs Abs: 2.2 10*3/uL (ref 0.7–4.0)
MCHC: 33 g/dL (ref 30.0–36.0)
MCV: 86.8 fl (ref 78.0–100.0)
Monocytes Absolute: 0.6 10*3/uL (ref 0.1–1.0)
Monocytes Relative: 9.7 % (ref 3.0–12.0)
Neutro Abs: 2.8 10*3/uL (ref 1.4–7.7)
Neutrophils Relative %: 48.7 % (ref 43.0–77.0)
Platelets: 164 10*3/uL (ref 150.0–400.0)
RBC: 4.46 Mil/uL (ref 4.22–5.81)
RDW: 14.5 % (ref 11.5–15.5)
WBC: 5.8 10*3/uL (ref 4.0–10.5)

## 2014-04-03 LAB — BASIC METABOLIC PANEL
BUN: 27 mg/dL — ABNORMAL HIGH (ref 6–23)
CO2: 27 mEq/L (ref 19–32)
Calcium: 9.3 mg/dL (ref 8.4–10.5)
Chloride: 106 mEq/L (ref 96–112)
Creatinine, Ser: 1.2 mg/dL (ref 0.4–1.5)
GFR: 60.85 mL/min (ref >60.00)
Glucose, Bld: 94 mg/dL (ref 70–99)
Potassium: 5.1 mEq/L (ref 3.5–5.1)
Sodium: 139 mEq/L (ref 135–145)

## 2014-04-03 LAB — BRAIN NATRIURETIC PEPTIDE: Pro B Natriuretic peptide (BNP): 73 pg/mL (ref 0.0–100.0)

## 2014-04-03 NOTE — Progress Notes (Signed)
Subjective:    Patient ID: Timothy Gallagher, male    DOB: Oct 08, 1936, 78 y.o.   MRN: [id number]  Shortness of Breath This is a new problem. The current episode started 1 to 4 weeks ago. The problem occurs intermittently. The problem has been waxing and waning. Pertinent negatives include no abdominal pain, chest pain, claudication, coryza, ear pain, fever, headaches, hemoptysis, leg pain, leg swelling, neck pain, orthopnea, PND, rash, rhinorrhea, sore throat, sputum production, swollen glands, syncope, vomiting or wheezing. The symptoms are aggravated by any activity. The patient has no known risk factors for DVT/PE. He has tried rest for the symptoms. The treatment provided mild relief. His past medical history is significant for allergies. There is no history of CAD, COPD or a heart failure.     Review of Systems  Constitutional: Negative for fever and chills.  HENT: Negative for ear pain, rhinorrhea and sore throat.   Respiratory: Positive for shortness of breath. Negative for hemoptysis, sputum production and wheezing.   Cardiovascular: Negative for chest pain, orthopnea, claudication, leg swelling, syncope and PND.  Gastrointestinal: Negative for nausea, vomiting, abdominal pain and diarrhea.  Musculoskeletal: Negative for neck pain.  Skin: Negative for rash.  Neurological: Negative for headaches.  All other systems reviewed and are negative.   Past Medical History  Diagnosis Date  . ACNE ROSACEA 06/27/2009  . ACTINIC KERATOSIS, HEAD 04/18/2009  . Acute maxillary sinusitis 05/14/2010  . ALLERGIC RHINITIS 04/09/2007  . B12 DEFICIENCY 06/07/2007  . BACK PAIN WITH RADICULOPATHY 04/24/2008  . CHEST WALL PAIN, ACUTE 06/15/2009  . Chronic maxillary sinusitis 05/29/2008  . COLITIS 04/27/2009  . COLONIC POLYPS, HX OF 04/27/2009    tubular adenomas  . DERMATITIS, ATOPIC 10/12/2007  . DIVERTICULOSIS, COLON 04/27/2009  . ECCHYMOSES, SPONTANEOUS 06/27/2008  . Elevated sedimentation rate 05/02/2009    . ESOPHAGEAL STRICTURE 04/27/2009  . GASTRITIS, CHRONIC 04/27/2009  . HYPERLIPIDEMIA 03/13/2008  . HYPERTENSION 04/09/2007  . Irritable bowel syndrome 08/11/2007  . KIDNEY DISEASE 04/09/2007  . NECK PAIN 09/14/2008  . NEUROPATHY, IDIOPATHIC PERIPHERAL NEC 08/11/2007  . OSTEOARTHRITIS, WRIST, RIGHT 08/05/2010  . Cancer     skin   Past Surgical History  Procedure Laterality Date  . Hernia repair    . Lamenectomy    . Lumbar fusion    . Tonsillectomy    . Incision and drainage wound with foreign body removal Left 12/20/2013    Procedure: INCISION AND DRAINAGE LEFT INDEX FINGER;  Surgeon: Tennis Must, MD;  Location: [facility];  Service: Orthopedics;  Laterality: Left;  . Esophagogastroduodenoscopy    . Colonoscopy    . Flexible sigmoidoscopy      reports that he quit smoking about 12 years ago. He has never used smokeless tobacco. He reports that he does not drink alcohol or use illicit drugs. family history includes COPD in his father; Heart disease in his father; Hernia in his mother. There is no history of Colon cancer. Allergies  Allergen Reactions  . Ciprofloxacin Swelling  . Lipitor [Atorvastatin]     REACTION: nausea and blurred vision  . Mycophenolate Mofetil     REACTION: unspecified  . Rosuvastatin     Unknown   . Amoxicillin Rash  . Penicillins Rash      Objective:   Physical Exam  Nursing note and vitals reviewed. Constitutional: He is oriented to person, place, and time. He appears well-developed and well-nourished. No distress.  HENT:  Head: Normocephalic and atraumatic.  Eyes: Conjunctivae and EOM are  normal. Pupils are equal, round, and reactive to light.  Neck: Normal range of motion. Neck supple. No JVD present.  Cardiovascular: Normal rate, regular rhythm, normal heart sounds and intact distal pulses.  Exam reveals no gallop and no friction rub.   No murmur heard. Pulmonary/Chest: Effort normal and breath sounds normal. No stridor. No respiratory distress. He  has no wheezes. He has no rales. He exhibits no tenderness.  Musculoskeletal: Normal range of motion. He exhibits no edema and no tenderness.  Lymphadenopathy:    He has no cervical adenopathy.  Neurological: He is alert and oriented to person, place, and time.  Skin: Skin is warm and dry. No rash noted. He is not diaphoretic. No erythema. No pallor.  Psychiatric: He has a normal mood and affect. His behavior is normal. Judgment and thought content normal.   Filed Vitals:   04/03/14 1307  BP: 130/80  Pulse: 63  Temp: 97.8 F (36.6 C)  Resp: 18   Lab Results  Component Value Date   WBC 4.9 08/29/2013   HGB 13.6 12/20/2013   HCT 40.0 12/20/2013   PLT 154.0 08/29/2013   GLUCOSE 99 12/20/2013   CHOL 221* 08/29/2013   TRIG 158.0* 08/29/2013   HDL 42.20 08/29/2013   LDLDIRECT 143.2 08/29/2013   LDLCALC 128* 09/28/2012   ALT 15 08/29/2013   AST 21 08/29/2013   NA 142 12/20/2013   K 3.9 12/20/2013   CL 103 12/20/2013   CREATININE 1.00 12/20/2013   BUN 22 12/20/2013   CO2 27 08/29/2013   TSH 2.26 08/29/2013   PSA 3.52 08/29/2013   HGBA1C 6.1* 05/17/2007      Assessment & Plan:  Timothy Gallagher was seen today for shortness of breath.  Diagnoses and associated orders for this visit:  SOB (shortness of breath) on exertion - DG Chest 2 View; Future - Basic Metabolic Panel - Brain Natriuretic Peptide - CBC with Differential  Chest congestion - DG Chest 2 View; Future - Basic Metabolic Panel - Brain Natriuretic Peptide - CBC with Differential  Other malaise and fatigue - Basic Metabolic Panel - Brain Natriuretic Peptide - CBC with Differential   Start work up to rule out Heart failure and other causes of shortness of breath and fatigue. Plan to follow up in 1 week to reassess.   Patient Instructions  You'll have lab work drawn today. We will call you with the results of these when they're available.  You will have a chest x-ray done today at the [place] office. We will call you with the  results of this when it is available.  Followup in one week, or sooner symptoms worsen or fail to improve despite treatment.

## 2014-04-03 NOTE — Patient Instructions (Signed)
You'll have lab work drawn today. We will call you with the results of these when they're available.  You will have a chest x-ray done today at the [place] office. We will call you with the results of this when it is available.  Followup in one week, or sooner symptoms worsen or fail to improve despite treatment.   Shortness of Breath Shortness of breath means you have trouble breathing. Shortness of breath needs medical care right away. HOME CARE   Do not smoke.  Avoid being around chemicals or things (paint fumes, dust) that may bother your breathing.  Rest as needed. Slowly begin your normal activities.  Only take medicines as told by your doctor.  Keep all doctor visits as told. GET HELP RIGHT AWAY IF:   Your shortness of breath gets worse.  You feel lightheaded, pass out (faint), or have a cough that is not helped by medicine.  You cough up blood.  You have pain with breathing.  You have pain in your chest, arms, shoulders, or belly (abdomen).  You have a fever.  You cannot walk up stairs or exercise the way you normally do.  You do not get better in the time expected.  You have a hard time doing normal activities even with rest.  You have problems with your medicines.  You have any new symptoms. MAKE SURE YOU:  Understand these instructions.  Will watch your condition.  Will get help right away if you are not doing well or get worse. Document Released: 04/21/2008 Document Revised: 05/04/2012 Document Reviewed: 01/19/2012 [facility] Patient Information 2014 [place], [place].

## 2014-04-03 NOTE — Progress Notes (Signed)
Pre visit review using our clinic review tool, if applicable. No additional management support is needed unless otherwise documented below in the visit note. 

## 2014-04-11 ENCOUNTER — Encounter: Payer: Self-pay | Admitting: Physician Assistant

## 2014-04-11 ENCOUNTER — Ambulatory Visit (INDEPENDENT_AMBULATORY_CARE_PROVIDER_SITE_OTHER): Payer: Medicare Other | Admitting: Physician Assistant

## 2014-04-11 VITALS — BP 130/82 | HR 54 | Temp 98.1°F | Resp 18 | Wt 194.0 lb

## 2014-04-11 DIAGNOSIS — R0602 Shortness of breath: Secondary | ICD-10-CM

## 2014-04-11 DIAGNOSIS — Z09 Encounter for follow-up examination after completed treatment for conditions other than malignant neoplasm: Secondary | ICD-10-CM

## 2014-04-11 DIAGNOSIS — D649 Anemia, unspecified: Secondary | ICD-10-CM

## 2014-04-11 NOTE — Progress Notes (Signed)
Pre visit review using our clinic review tool, if applicable. No additional management support is needed unless otherwise documented below in the visit note. 

## 2014-04-11 NOTE — Patient Instructions (Signed)
You'll be called to schedule a nuclear stress test to look for additional coronary cause of your shortness of breath on exertion.  We will call you with the results of these when they are available.  Followup in 2 weeks to reassess and to go over results.   Pharmacologic Stress Echocardiogram A pharmacologic stress echocardiogram is a heart (cardiac) test used to check the function of your heart. This test may also be called a pharmacologic stress echocardiography. Pharmacologic means that a medicine is used to increase your heart rate and blood pressure.  This stress test will check how well your heart muscle and valves are working and determine if your heart muscle is getting enough blood. Some people exercise on a treadmill, which naturally increases or stresses the functioning of their heart. For those people unable to exercise on a treadmill, a medicine is used. This medicine stimulates your heart and will cause your heart to beat harder and more quickly, as if you were exercising.  An echocardiogram uses sound waves (ultrasound) to produce an image of your heart. If your heart does not work normally, it may indicate coronary artery disease with poor coronary blood supply. The coronary arteries are the arteries that bring blood and oxygen to your heart. LET [facility] CARE PROVIDER KNOW ABOUT:  Any allergies you have.  All medicines you are taking, including vitamins, herbs, eye drops, creams, and over-the-counter medicines.  Previous problems you or members of your family have had with the use of anesthetics.  Any blood disorders you have.  Previous surgeries you have had.  Medical conditions you have.  Possibility of pregnancy, if this applies. RISKS AND COMPLICATIONS Generally, this is a safe procedure. However, as with any procedure, complications can occur. Possible complications can include:  You develop pain or pressure in the following areas:  Chest.  Jaw or  neck.  Between your shoulder blades.  Radiating down your left arm.  Headache.  Dizziness or lightheadedness.  Shortness of breath.  Increased or irregular heartbeat.  Low blood pressure.  Nausea or vomiting.  Flushing.  Redness going up the arm and slight pain during injection of medicine.  Heart attack (rare). BEFORE THE PROCEDURE  Avoid all forms of caffeine for 24 hours before your test or as directed by your health care provider. This includes coffee, tea (even decaffeinated tea), caffeinated sodas, chocolate, cocoa, and certain pain medicines.  Follow your health care provider's instructions regarding eating and drinking before the test.  Take your medicines as directed at regular times with water unless instructed otherwise. Exceptions may include:  If you have diabetes, ask how you are to take your insulin or pills. It is common to adjust insulin dosing the morning of the test.  If you are taking beta-blocker medicines, it is important to talk to your health care provider about these medicines well before the date of your test. Taking beta-blocker medicines may interfere with the test. In some cases, these medicines need to be changed or stopped 24 hours or more before the test.  If you wear a nitroglycerin patch, it may need to be removed prior to the test. Ask your health care provider if the patch should be removed before the test.  If you use an inhaler for any breathing condition, bring it with you to the test.  If you are an outpatient, bring a snack so you can eat right after the stress phase of the test.  Do not smoke for 4 hours prior  to the test or as directed by your health care provider.  Wear comfortable shoes and clothing. Let your health care provider know if you were unable to complete or follow the preparations for your test. PROCEDURE   Multiple electrodes will be put on your chest. If needed, small areas of your chest may be shaved to get  better contact with the electrodes. Once the electrodes are attached to your body, multiple wires will be attached to the electrodes, and your heart rate will be monitored.  An IV access will be started, and medicine will be given.  You will have an echocardiogram done at rest and done again at peak heart rate.  To produce an image of the heart, gel is applied to your chest, and a wand-like tool (transducer) is moved over the chest. The transducer sends the sound waves through the chest to create the moving images of your heart.  The test takes a couple of hours. AFTER THE PROCEDURE  Your heart rate and blood pressure will be monitored after the test.  You may return to your normal schedule, including diet, activities, and medicines, unless your health care provider tells you otherwise. Document Released: 01/24/2004 Document Revised: 08/24/2013 Document Reviewed: 07/11/2013 [facility] Patient Information 2014 [place].

## 2014-04-11 NOTE — Progress Notes (Signed)
Subjective:    Patient ID: Timothy Gallagher, male    DOB: [birth year], 78 y.o.   MRN: [id number]  HPI Patient is a 78 year old Caucasian male presenting for followup of dyspnea on exertion. Patient was seen last week, workup was started for congestive heart failure. Chest x-ray last week normal, BMP normal, BNP normal, slight anemia on CBC consistent with history of B12 deficiency anemia for which patient is receiving monthly B12 injections. Patient still complaining of occasional shortness of breath on exertion,  occurring a couple times each week, require patient to rest for a few minutes before starting activity again. No other symptoms. Patient denies fevers, chills, nausea, vomiting, diarrhea, and chest pain.   Review of Systems As per history of present illness and otherwise negative.  Past Medical History  Diagnosis Date  . ACNE ROSACEA 06/27/2009  . ACTINIC KERATOSIS, HEAD 04/18/2009  . Acute maxillary sinusitis 05/14/2010  . ALLERGIC RHINITIS 04/09/2007  . B12 DEFICIENCY 06/07/2007  . BACK PAIN WITH RADICULOPATHY 04/24/2008  . CHEST WALL PAIN, ACUTE 06/15/2009  . Chronic maxillary sinusitis 05/29/2008  . COLITIS 04/27/2009  . COLONIC POLYPS, HX OF 04/27/2009    tubular adenomas  . DERMATITIS, ATOPIC 10/12/2007  . DIVERTICULOSIS, COLON 04/27/2009  . ECCHYMOSES, SPONTANEOUS 06/27/2008  . Elevated sedimentation rate 05/02/2009  . ESOPHAGEAL STRICTURE 04/27/2009  . GASTRITIS, CHRONIC 04/27/2009  . HYPERLIPIDEMIA 03/13/2008  . HYPERTENSION 04/09/2007  . Irritable bowel syndrome 08/11/2007  . KIDNEY DISEASE 04/09/2007  . NECK PAIN 09/14/2008  . NEUROPATHY, IDIOPATHIC PERIPHERAL NEC 08/11/2007  . OSTEOARTHRITIS, WRIST, RIGHT 08/05/2010  . Cancer     skin   Past Surgical History  Procedure Laterality Date  . Hernia repair    . Lamenectomy    . Lumbar fusion    . Tonsillectomy    . Incision and drainage wound with foreign body removal Left 12/20/2013    Procedure: INCISION AND DRAINAGE LEFT  INDEX FINGER;  Surgeon: Tennis Must, MD;  Location: [facility];  Service: Orthopedics;  Laterality: Left;  . Esophagogastroduodenoscopy    . Colonoscopy    . Flexible sigmoidoscopy      reports that he quit smoking about 12 years ago. He has never used smokeless tobacco. He reports that he does not drink alcohol or use illicit drugs. family history includes COPD in his father; Heart disease in his father; Hernia in his mother. There is no history of Colon cancer. Allergies  Allergen Reactions  . Ciprofloxacin Swelling  . Lipitor [Atorvastatin]     REACTION: nausea and blurred vision  . Mycophenolate Mofetil     REACTION: unspecified  . Rosuvastatin     Unknown   . Amoxicillin Rash  . Penicillins Rash       Objective:   Physical Exam  Nursing note and vitals reviewed. Constitutional: He is oriented to person, place, and time. He appears well-developed and well-nourished. No distress.  HENT:  Head: Normocephalic and atraumatic.  Eyes: Conjunctivae and EOM are normal. Pupils are equal, round, and reactive to light.  Neck: Normal range of motion. Neck supple. No JVD present.  Cardiovascular: Normal rate, regular rhythm, normal heart sounds and intact distal pulses.  Exam reveals no gallop and no friction rub.   No murmur heard. Pulmonary/Chest: Effort normal and breath sounds normal. No stridor. No respiratory distress. He has no wheezes. He has no rales. He exhibits no tenderness.  Musculoskeletal: Normal range of motion.  Lymphadenopathy:    He has no cervical adenopathy.  Neurological: He is alert and oriented to person, place, and time.  Skin: Skin is warm and dry. No rash noted. He is not diaphoretic. No erythema. No pallor.  Psychiatric: He has a normal mood and affect. His behavior is normal. Judgment and thought content normal.    Filed Vitals:   04/11/14 1045  BP: 130/82  Pulse: 54  Temp: 98.1 F (36.7 C)  Resp: 18    Lab Results  Component Value Date   WBC 5.8  04/03/2014   HGB 12.8* 04/03/2014   HCT 38.7* 04/03/2014   PLT 164.0 04/03/2014   GLUCOSE 94 04/03/2014   CHOL 221* 08/29/2013   TRIG 158.0* 08/29/2013   HDL 42.20 08/29/2013   LDLDIRECT 143.2 08/29/2013   LDLCALC 128* 09/28/2012   ALT 15 08/29/2013   AST 21 08/29/2013   NA 139 04/03/2014   K 5.1 04/03/2014   CL 106 04/03/2014   CREATININE 1.2 04/03/2014   BUN 27* 04/03/2014   CO2 27 04/03/2014   TSH 2.26 08/29/2013   PSA 3.52 08/29/2013   HGBA1C 6.1* 05/17/2007      Assessment & Plan:  Timothy Gallagher was seen today for 1 week follow up.  Diagnoses and associated orders for this visit:  Follow up Comments: Still having SOB with exertion. - Myocardial Perfusion Imaging; Future  SOB (shortness of breath) on exertion Comments: BNP was normal. CAD? Get nuclear stress test to check CAD. - Myocardial Perfusion Imaging; Future  Low hemoglobin and low hematocrit Comments: Pt with hx of B12 deficiency anemia. Currently being treated with B12 injections. Will continue to monitor.   If stress test reveals no CAD, and pt is still experiencing symptoms, we will work up for pulmonary origin of SOB.  Plan to follow up in 2 weeks to reassess and to go over results of stress test.  Patient Instructions  You'll be called to schedule a nuclear stress test to look for additional coronary cause of your shortness of breath on exertion.  We will call you with the results of these when they are available.  Followup in 2 weeks to reassess and to go over results.

## 2014-04-13 ENCOUNTER — Telehealth: Payer: Self-pay | Admitting: Internal Medicine

## 2014-04-13 NOTE — Telephone Encounter (Signed)
Patient Information:  Caller Name: Samay  Phone: [phone]  Patient: Timothy Gallagher, Timothy Gallagher  Gender: Male  DOB: Feb 01, 1936  Age: 78 Years  PCP: Benay Pillow (Adults only)  Office Follow Up:  Does the office need to follow up with this patient?: No  Instructions For The Office: N/A  RN Note:  Reports left side chest pain "in my lung" radiates to left shoulder blade "like a real bad pulled muscle."  When sitting has no pain. Last chest pain was about 1030 when swept porch off.  Exertion or movement causes the pain occur and shortness of breath. Feels fine other than cough, sinus drainage and intermittent exertional shortness of breath.  Advised to see MD today for intermittent chest pains persist > 3 days.  Requesting antibiotics. Advised must be seen for antibiotics. Declined to schedule appointment.  Has follow up appointment for 05/15/14 and injection scheduled for 04/21/14. Explained emergent symptoms and reasons to call 911. Verbalized understanding.  Symptoms  Reason For Call & Symptoms: Chest pain with cough, sneezing, thick nasal congestion, intermittent pain ("like muscle pain") in both arms and some shortness of breath with exertion for past month. Seen 04/11/14. Reported his cardiac labs were normal.  Awaiting referral for stress test and pulmonary function test.  Reviewed Health History In EMR: Yes  Reviewed Medications In EMR: Yes  Reviewed Allergies In EMR: Yes  Reviewed Surgeries / Procedures: Yes  Date of Onset of Symptoms: 03/14/2014  Treatments Tried: antihistamines, nasal spray, Zpack, hot shower, ibuprofen  Treatments Tried Worked: Yes  Guideline(s) Used:  Chest Pain  Disposition Per Guideline:   See Today in Office  Reason For Disposition Reached:   Intermittent chest pains persist > 3 days  Advice Given:  Fleeting Chest Pain:  Fleeting chest pains that last only a few seconds and then go away are generally not serious. They may be from pinched muscles or nerves in  your chest wall.  Chest Pain Only When Coughing:  Chest pains that occur with coughing generally come from the chest wall and from irritation of the airways. They are usually not serious.  Call Back If:  Severe chest pain  Constant chest pain lasting longer than 5 minutes  Difficulty breathing  Fever  You become worse.  Patient Refused Recommendation:  Patient Requests Prescription  Refused to schedule appointment for 04/13/14; requesting antibiotic for sinus drainage and cough.

## 2014-04-13 NOTE — Telephone Encounter (Signed)
Called and spoke with pt and pt states

## 2014-04-13 NOTE — Telephone Encounter (Signed)
Pt was just seen on 04/11/14 and pt has congestion.  Pls advise.

## 2014-04-14 ENCOUNTER — Encounter: Payer: Self-pay | Admitting: Physician Assistant

## 2014-04-14 ENCOUNTER — Ambulatory Visit (INDEPENDENT_AMBULATORY_CARE_PROVIDER_SITE_OTHER): Payer: Medicare Other | Admitting: Physician Assistant

## 2014-04-14 VITALS — BP 124/74 | HR 49 | Temp 97.4°F | Resp 18 | Ht 72.0 in | Wt 197.0 lb

## 2014-04-14 DIAGNOSIS — Z09 Encounter for follow-up examination after completed treatment for conditions other than malignant neoplasm: Secondary | ICD-10-CM

## 2014-04-14 DIAGNOSIS — R0989 Other specified symptoms and signs involving the circulatory and respiratory systems: Secondary | ICD-10-CM

## 2014-04-14 DIAGNOSIS — R0602 Shortness of breath: Secondary | ICD-10-CM

## 2014-04-14 DIAGNOSIS — R0789 Other chest pain: Secondary | ICD-10-CM

## 2014-04-14 MED ORDER — PREDNISONE 20 MG PO TABS: 20.0000 mg | ORAL_TABLET | Freq: Every day | ORAL | Status: DC

## 2014-04-14 NOTE — Progress Notes (Signed)
Pre visit review using our clinic review tool, if applicable. No additional management support is needed unless otherwise documented below in the visit note. 

## 2014-04-14 NOTE — Telephone Encounter (Signed)
Called pt regarding recent office call. Pt states he made an appointment to see me at 9:45 today. Will see him for URI symptoms and muscle ache symptoms.

## 2014-04-14 NOTE — Progress Notes (Signed)
Subjective:    Patient ID: Timothy Gallagher, male    DOB: December 20, 1935, 78 y.o.   MRN: [id number]  HPI Patient is a 78 year old Caucasian male presenting for muscle pain. The patient has been seen recently and is in the process of being worked up for dyspnea on exertion. He has a nuclear stress test currently being scheduled. He states that 2 weeks ago he was working on a heavy door that weighed approximately 125 pounds and had to lift it into place. He states that for the past 2 weeks he has been having increasingly sore muscles in his chest and both of his arms and even the back of his neck. He states that the pain only comes on whenever he is working with his arms, including lifting anything heavy, and any repetitive arm motion like with sweeping. He states that he is getting good relief from Advil for the pain. He denies palpitations, headache, and syncope.  He is also still complaining about chest congestion which he believes is due to allergies, however is not relieved with his Flonase use. He has taken Zyrtec in the past, but is not currently taking it. His associated symptoms are post nasal drip. He also believes that his dyspnea on exertion for which he is being worked up for a cardiac origin is instead related to his allergies. He denies cough, fever, chills, sinus pressure, sore throat, nausea, vomiting, diarrhea.   Review of Systems As per the history of present illness and otherwise negative.   Past Medical History  Diagnosis Date  . ACNE ROSACEA 06/27/2009  . ACTINIC KERATOSIS, HEAD 04/18/2009  . Acute maxillary sinusitis 05/14/2010  . ALLERGIC RHINITIS 04/09/2007  . B12 DEFICIENCY 06/07/2007  . BACK PAIN WITH RADICULOPATHY 04/24/2008  . CHEST WALL PAIN, ACUTE 06/15/2009  . Chronic maxillary sinusitis 05/29/2008  . COLITIS 04/27/2009  . COLONIC POLYPS, HX OF 04/27/2009    tubular adenomas  . DERMATITIS, ATOPIC 10/12/2007  . DIVERTICULOSIS, COLON 04/27/2009  . ECCHYMOSES, SPONTANEOUS  06/27/2008  . Elevated sedimentation rate 05/02/2009  . ESOPHAGEAL STRICTURE 04/27/2009  . GASTRITIS, CHRONIC 04/27/2009  . HYPERLIPIDEMIA 03/13/2008  . HYPERTENSION 04/09/2007  . Irritable bowel syndrome 08/11/2007  . KIDNEY DISEASE 04/09/2007  . NECK PAIN 09/14/2008  . NEUROPATHY, IDIOPATHIC PERIPHERAL NEC 08/11/2007  . OSTEOARTHRITIS, WRIST, RIGHT 08/05/2010  . Cancer     skin   Past Surgical History  Procedure Laterality Date  . Hernia repair    . Lamenectomy    . Lumbar fusion    . Tonsillectomy    . Incision and drainage wound with foreign body removal Left 12/20/2013    Procedure: INCISION AND DRAINAGE LEFT INDEX FINGER;  Surgeon: Tennis Must, MD;  Location: [facility];  Service: Orthopedics;  Laterality: Left;  . Esophagogastroduodenoscopy    . Colonoscopy    . Flexible sigmoidoscopy      reports that he quit smoking about 12 years ago. He has never used smokeless tobacco. He reports that he does not drink alcohol or use illicit drugs. family history includes COPD in his father; Heart disease in his father; Hernia in his mother. There is no history of Colon cancer. Allergies  Allergen Reactions  . Ciprofloxacin Swelling  . Lipitor [Atorvastatin]     REACTION: nausea and blurred vision  . Mycophenolate Mofetil     REACTION: unspecified  . Rosuvastatin     Unknown   . Amoxicillin Rash  . Penicillins Rash       Objective:  Physical Exam  Nursing note and vitals reviewed. Constitutional: He is oriented to person, place, and time. He appears well-developed and well-nourished. No distress.  HENT:  Head: Normocephalic and atraumatic.  Right Ear: External ear normal.  Left Ear: External ear normal.  Nose: Nose normal.  Mouth/Throat: No oropharyngeal exudate.  Oropharynx is slightly erythematous, no exudate.  Bilateral tympanic membranes are normal.  Bilateral frontal and maxillary sinuses are nontender to palpation.  Eyes: Conjunctivae and EOM are normal. Pupils are equal,  round, and reactive to light.  Neck: Normal range of motion. Neck supple. No JVD present.  Cardiovascular: Normal rate, regular rhythm, normal heart sounds and intact distal pulses.  Exam reveals no gallop and no friction rub.   No murmur heard. Pulmonary/Chest: Effort normal and breath sounds normal. No stridor. No respiratory distress. He has no wheezes. He has no rales. He exhibits no tenderness.  Musculoskeletal: Normal range of motion.  Lymphadenopathy:    He has no cervical adenopathy.  Neurological: He is alert and oriented to person, place, and time.  Skin: Skin is warm and dry. No rash noted. He is not diaphoretic. No erythema. No pallor.  Psychiatric: He has a normal mood and affect. His behavior is normal. Judgment and thought content normal.   Filed Vitals:   04/14/14 0949  BP: 124/74  Pulse: 49  Temp: 97.4 F (36.3 C)  TempSrc: Oral  Resp: 18  Height: 6' (1.829 m)  Weight: 197 lb (89.359 kg)  SpO2: 98%   Lab Results  Component Value Date   WBC 5.8 04/03/2014   HGB 12.8* 04/03/2014   HCT 38.7* 04/03/2014   PLT 164.0 04/03/2014   GLUCOSE 94 04/03/2014   CHOL 221* 08/29/2013   TRIG 158.0* 08/29/2013   HDL 42.20 08/29/2013   LDLDIRECT 143.2 08/29/2013   LDLCALC 128* 09/28/2012   ALT 15 08/29/2013   AST 21 08/29/2013   NA 139 04/03/2014   K 5.1 04/03/2014   CL 106 04/03/2014   CREATININE 1.2 04/03/2014   BUN 27* 04/03/2014   CO2 27 04/03/2014   TSH 2.26 08/29/2013   PSA 3.52 08/29/2013   HGBA1C 6.1* 05/17/2007       Assessment & Plan:  Cheryl was seen today for muscle pain.  Diagnoses and associated orders for this visit:  Chest congestion Comments: Possibly related to allergies or underlying COPD? Will try short course of Prednisone. - predniSONE (DELTASONE) 20 MG tablet; Take 1 tablet (20 mg total) by mouth daily with breakfast.  Follow up Comments: Congestion still not better. Still some DOE. Awaiting stress test scheduling. - predniSONE (DELTASONE) 20 MG  tablet; Take 1 tablet (20 mg total) by mouth daily with breakfast.  SOB (shortness of breath) on exertion Comments: Happening less often, improving? Still awaiting stress test.  Musculoskeletal chest pain Comments: Chest, arm, and neck pain, with lifting, likely musculoskeletal. Will have stress test for DOE anyway.    Plan to follow up in 1 to 2 weeks to reassess.  Patient Instructions  You should be getting a call soon to schedule your stress test. We will call you with the results of these when they are available.  Prednisone 20 mg tab one tab per day with breakfast for 7 days to try and help your persist chest congestion and allergy symptoms.  Continue Flonase daily. Zyrtec once daily at nighttime may also help.  Drink plenty of fluids.  It may take several weeks for your chest muscle pain to fully resolve as it  is likely a pulled muscle. Return to clinic if it worsens despite rest.  Followup in one to 2 weeks to reassess, or sooner if symptoms worsen or fail to improve despite treatment.

## 2014-04-14 NOTE — Patient Instructions (Addendum)
You should be getting a call soon to schedule your stress test. We will call you with the results of these when they are available.  Prednisone 20 mg tab one tab per day with breakfast for 7 days to try and help your persist chest congestion and allergy symptoms.  Continue Flonase daily. Zyrtec once daily at nighttime may also help.  Drink plenty of fluids.  It may take several weeks for your chest muscle pain to fully resolve as it is likely a pulled muscle. Return to clinic if it worsens despite rest.  Followup in one to 2 weeks to reassess, or sooner if symptoms worsen or fail to improve despite treatment.   Musculoskeletal Pain Musculoskeletal pain is muscle and boney aches and pains. These pains can occur in any part of the body. Your caregiver may treat you without knowing the cause of the pain. They may treat you if blood or urine tests, X-rays, and other tests were normal.  CAUSES There is often not a definite cause or reason for these pains. These pains may be caused by a type of germ (virus). The discomfort may also come from overuse. Overuse includes working out too hard when your body is not fit. Boney aches also come from weather changes. Bone is sensitive to atmospheric pressure changes. HOME CARE INSTRUCTIONS   Ask when your test results will be ready. Make sure you get your test results.  Only take over-the-counter or prescription medicines for pain, discomfort, or fever as directed by your caregiver. If you were given medications for your condition, do not drive, operate machinery or power tools, or sign legal documents for 24 hours. Do not drink alcohol. Do not take sleeping pills or other medications that may interfere with treatment.  Continue all activities unless the activities cause more pain. When the pain lessens, slowly resume normal activities. Gradually increase the intensity and duration of the activities or exercise.  During periods of severe pain, bed rest may  be helpful. Lay or sit in any position that is comfortable.  Putting ice on the injured area.  Put ice in a bag.  Place a towel between your skin and the bag.  Leave the ice on for 15 to 20 minutes, 3 to 4 times a day.  Follow up with your caregiver for continued problems and no reason can be found for the pain. If the pain becomes worse or does not go away, it may be necessary to repeat tests or do additional testing. Your caregiver may need to look further for a possible cause. SEEK IMMEDIATE MEDICAL CARE IF:  You have pain that is getting worse and is not relieved by medications.  You develop chest pain that is associated with shortness or breath, sweating, feeling sick to your stomach (nauseous), or throw up (vomit).  Your pain becomes localized to the abdomen.  You develop any new symptoms that seem different or that concern you. MAKE SURE YOU:   Understand these instructions.  Will watch your condition.  Will get help right away if you are not doing well or get worse. Document Released: 11/03/2005 Document Revised: 01/26/2012 Document Reviewed: 07/08/2013 [facility] Patient Information 2014 [place].

## 2014-04-21 ENCOUNTER — Ambulatory Visit (INDEPENDENT_AMBULATORY_CARE_PROVIDER_SITE_OTHER): Payer: Medicare Other | Admitting: Internal Medicine

## 2014-04-21 DIAGNOSIS — E538 Deficiency of other specified B group vitamins: Secondary | ICD-10-CM

## 2014-04-21 MED ORDER — CYANOCOBALAMIN 1000 MCG/ML IJ SOLN
1000.0000 ug | Freq: Once | INTRAMUSCULAR | Status: AC
  Administered 2014-04-21: 1000 ug via INTRAMUSCULAR

## 2014-04-24 ENCOUNTER — Telehealth: Payer: Self-pay | Admitting: Internal Medicine

## 2014-04-24 ENCOUNTER — Ambulatory Visit (INDEPENDENT_AMBULATORY_CARE_PROVIDER_SITE_OTHER): Payer: Medicare Other | Admitting: Physician Assistant

## 2014-04-24 ENCOUNTER — Encounter: Payer: Self-pay | Admitting: Physician Assistant

## 2014-04-24 VITALS — BP 120/74 | HR 62 | Temp 98.0°F | Resp 18 | Ht 72.0 in | Wt 196.0 lb

## 2014-04-24 DIAGNOSIS — R350 Frequency of micturition: Secondary | ICD-10-CM

## 2014-04-24 DIAGNOSIS — R110 Nausea: Secondary | ICD-10-CM

## 2014-04-24 LAB — POCT URINALYSIS DIPSTICK
Glucose, UA: NEGATIVE
Leukocytes, UA: NEGATIVE
Nitrite, UA: NEGATIVE
Spec Grav, UA: >=1.03
Urobilinogen, UA: 0.2
pH, UA: 5.5

## 2014-04-24 MED ORDER — CIPROFLOXACIN HCL 500 MG PO TABS: 500.0000 mg | ORAL_TABLET | Freq: Two times a day (BID) | ORAL | Status: DC

## 2014-04-24 NOTE — Telephone Encounter (Signed)
Called and spoke with pt and pt is aware.  

## 2014-04-24 NOTE — Telephone Encounter (Signed)
Ok to send abx.  Cipro 500mg  BID x 14days.

## 2014-04-24 NOTE — Progress Notes (Signed)
Subjective:    Patient ID: Timothy Gallagher, male    DOB: 1936-06-25, 78 y.o.   MRN: [id number]  Urinary Frequency  This is a new problem. The current episode started in the past 7 days. The problem occurs every urination. The problem has been gradually improving. The pain is at a severity of 0/10. The patient is experiencing no pain. Associated symptoms include chills, frequency, nausea and urgency. Pertinent negatives include no discharge, flank pain, hematuria, hesitancy, sweats or vomiting. Treatments tried: flomax. The treatment provided significant relief. There is no history of catheterization or recurrent UTIs.      Review of Systems  Constitutional: Positive for chills. Negative for fever.  Respiratory: Negative for shortness of breath.   Gastrointestinal: Positive for nausea. Negative for vomiting and diarrhea.  Genitourinary: Positive for urgency and frequency. Negative for dysuria, hesitancy, hematuria and flank pain.  All other systems reviewed and are negative.  Past Medical History  Diagnosis Date  . ACNE ROSACEA 06/27/2009  . ACTINIC KERATOSIS, HEAD 04/18/2009  . Acute maxillary sinusitis 05/14/2010  . ALLERGIC RHINITIS 04/09/2007  . B12 DEFICIENCY 06/07/2007  . BACK PAIN WITH RADICULOPATHY 04/24/2008  . CHEST WALL PAIN, ACUTE 06/15/2009  . Chronic maxillary sinusitis 05/29/2008  . COLITIS 04/27/2009  . COLONIC POLYPS, HX OF 04/27/2009    tubular adenomas  . DERMATITIS, ATOPIC 10/12/2007  . DIVERTICULOSIS, COLON 04/27/2009  . ECCHYMOSES, SPONTANEOUS 06/27/2008  . Elevated sedimentation rate 05/02/2009  . ESOPHAGEAL STRICTURE 04/27/2009  . GASTRITIS, CHRONIC 04/27/2009  . HYPERLIPIDEMIA 03/13/2008  . HYPERTENSION 04/09/2007  . Irritable bowel syndrome 08/11/2007  . KIDNEY DISEASE 04/09/2007  . NECK PAIN 09/14/2008  . NEUROPATHY, IDIOPATHIC PERIPHERAL NEC 08/11/2007  . OSTEOARTHRITIS, WRIST, RIGHT 08/05/2010  . Cancer     skin   Past Surgical History  Procedure Laterality Date    . Hernia repair    . Lamenectomy    . Lumbar fusion    . Tonsillectomy    . Incision and drainage wound with foreign body removal Left 12/20/2013    Procedure: INCISION AND DRAINAGE LEFT INDEX FINGER;  Surgeon: Tennis Must, MD;  Location: [facility];  Service: Orthopedics;  Laterality: Left;  . Esophagogastroduodenoscopy    . Colonoscopy    . Flexible sigmoidoscopy      reports that he quit smoking about 12 years ago. He has never used smokeless tobacco. He reports that he does not drink alcohol or use illicit drugs. family history includes COPD in his father; Heart disease in his father; Hernia in his mother. There is no history of Colon cancer. Allergies  Allergen Reactions  . Ciprofloxacin Swelling  . Lipitor [Atorvastatin]     REACTION: nausea and blurred vision  . Mycophenolate Mofetil     REACTION: unspecified  . Rosuvastatin     Unknown   . Amoxicillin Rash  . Penicillins Rash       Objective:   Physical Exam  Nursing note and vitals reviewed. Constitutional: He is oriented to person, place, and time. He appears well-developed and well-nourished. No distress.  HENT:  Head: Normocephalic and atraumatic.  Eyes: Conjunctivae and EOM are normal. Pupils are equal, round, and reactive to light.  Neck: Normal range of motion. Neck supple.  Cardiovascular: Normal rate, regular rhythm, normal heart sounds and intact distal pulses.  Exam reveals no gallop and no friction rub.   No murmur heard. Pulmonary/Chest: Effort normal and breath sounds normal. No respiratory distress. He has no wheezes. He has no  rales. He exhibits no tenderness.  Genitourinary:  No prostate tenderness. Normal DRE. Prostate slightly enlarged, not boggy.  Musculoskeletal: Normal range of motion.  Neurological: He is alert and oriented to person, place, and time.  Skin: Skin is warm and dry. No rash noted. He is not diaphoretic. No erythema. No pallor.  Psychiatric: He has a normal mood and affect. His  behavior is normal. Judgment and thought content normal.    Filed Vitals:   04/24/14 0910  BP: 120/74  Pulse: 62  Temp: 98 F (36.7 C)  Resp: 18    Lab Results  Component Value Date   WBC 5.8 04/03/2014   HGB 12.8* 04/03/2014   HCT 38.7* 04/03/2014   PLT 164.0 04/03/2014   GLUCOSE 94 04/03/2014   CHOL 221* 08/29/2013   TRIG 158.0* 08/29/2013   HDL 42.20 08/29/2013   LDLDIRECT 143.2 08/29/2013   LDLCALC 128* 09/28/2012   ALT 15 08/29/2013   AST 21 08/29/2013   NA 139 04/03/2014   K 5.1 04/03/2014   CL 106 04/03/2014   CREATININE 1.2 04/03/2014   BUN 27* 04/03/2014   CO2 27 04/03/2014   TSH 2.26 08/29/2013   PSA 3.52 08/29/2013   HGBA1C 6.1* 05/17/2007    Urinalysis    Component Value Date/Time   COLORURINE YELLOW 11/03/2007 1058   APPEARANCEUR Clear 11/03/2007 1058   LABSPEC > OR = 1.030 11/03/2007 1058   PHURINE 6.0 11/03/2007 1058   GLUCOSEU NEGATIVE 11/03/2007 1058   BILIRUBINUR 1+ 04/24/2014 0934   BILIRUBINUR NEGATIVE 11/03/2007 1058   KETONESUR NEGATIVE 11/03/2007 1058   PROTEINUR 3+ 04/24/2014 0934   UROBILINOGEN 0.2 04/24/2014 0934   UROBILINOGEN 0.2 mg/dL 11/03/2007 1058   NITRITE n 04/24/2014 0934   NITRITE Negative 11/03/2007 1058   LEUKOCYTESUR Negative 04/24/2014 0934       Assessment & Plan:  Timothy Gallagher was seen today for urinary frequency, nausea and chills.  Diagnoses and associated orders for this visit:  Frequent urination Comments: States improved with Flomax. Will double flomax daily for next 3 to 5 days and then reduce to 1 a day again. Prostatitis? Normal DRE and Prostate exam. - POCT urinalysis dipstick - Culture, Urine  Nausea alone Comments: associated chills. Prostatitis vs. Viral illness? No other exam findings. Pt will monitor for now and follow up for worsening symptoms.   Return precautions provided.  Plan to follow up in about 1 month to reassess, or sooner for worsening or persistent symptoms.   Patient Instructions  Take Flomax twice  per day for the next 3-5 days until your symptoms are improving, then back down to once per day again.  Continue to monitor your symptoms of nausea and chills. If you develop a fever, or if the symptoms worsen, followup with clinic.  Plan to followup as needed for worsening or persistent symptoms, and followup as planned in one month to review stress test results.

## 2014-04-24 NOTE — Progress Notes (Signed)
Pre visit review using our clinic review tool, if applicable. No additional management support is needed unless otherwise documented below in the visit note. 

## 2014-04-24 NOTE — Telephone Encounter (Signed)
Pt seen matt this morning, states matt informed him to call back if he needed him to call in an antibiotic for him. Pt states he has gotten worse with the nausea since he left his appt. Would like something called in at [facility] [address]

## 2014-04-24 NOTE — Patient Instructions (Signed)
Take Flomax twice per day for the next 3-5 days until your symptoms are improving, then back down to once per day again.  Continue to monitor your symptoms of nausea and chills. If you develop a fever, or if the symptoms worsen, followup with clinic.  Plan to followup as needed for worsening or persistent symptoms, and followup as planned in one month to review stress test results.     Benign Prostatic Hypertrophy  The prostate gland is part of the reproductive system of men. A normal prostate is about the size and shape of a walnut. The prostate gland produces a fluid that is mixed with sperm to make semen. This gland surrounds the urethra and is located in front of the rectum and just below the bladder. The bladder is where urine is stored. The urethra is the tube through which urine passes from the bladder to get out of the body. The prostate grows as a man ages. An enlarged prostate not caused by cancer is called benign prostatic hypertrophy (BPH). An enlarged prostate can press on the urethra. This can make it harder to pass urine. In the early stages of enlargement, the bladder can get by with a narrowed urethra by forcing the urine through. If the problem gets worse, medical or surgical treatment may be required.  This condition should be followed by your health care provider. The accumulation of urine in the bladder can cause infection. Back pressure and infection can progress to bladder damage and kidney (renal) failure. If needed, your health care provider may refer you to a specialist in kidney and prostate disease (urologist). CAUSES  BPH is a common health problem in men older than 50 years. This condition is a normal part of aging. However, not all men will develop problems from this condition. If the enlargement grows away from the urethra, then there will not be any compression of the urethra and resistance to urine flow.If the growth is toward the urethra and compresses it, you will  experience difficulty urinating.  SYMPTOMS   Not able to completely empty your bladder.  Getting up often during the night to urinate.  Need to urinate frequently during the day.  Difficultly starting urine flow.  Decrease in size and strength of your urine stream.  Dribbling after urination.  Pain on urination (more common with infection).  Inability to pass urine. This needs immediate treatment.  The development of a urinary tract infection. DIAGNOSIS  These tests will help your health care provider understand your problem:  A thorough history and physical examination.  A urination history, with the number of times you urinate, the amounts of urine, the strength of the urine stream, and the feeling of emptiness or fullness after urinating.  A postvoid bladder scan that measures any amount of urine that may remain in your bladder after you finish urinating.  Digital rectal exam. In a rectal exam, your health care provider checks your prostate by putting a gloved, lubricated finger into your rectum to feel the back of your prostate gland. This exam detects the size of your gland and abnormal lumps or growths.  Exam of your urine (urinalysis).  Prostate specific antigen (PSA) screening. This is a blood test used to screen for prostate cancer.  Rectal ultrasonography. This test uses sound waves to electronically produce a picture of your prostate gland. TREATMENT  Once symptoms begin, your health care provider will monitor your condition. Of the men with this condition, one third will have symptoms that  stabilize, one third will have symptoms that improve, and one third will have symptoms that progress in the first year. Mild symptoms may not need treatment. Simple observation and yearly exams may be all that is required. Medicines and surgery are options for more severe problems. Your health care provider can help you make an informed decision for what is best. Two classes of  medicines are available for relief of prostate symptoms:  Medicines that shrink the prostate. This helps relieve symptoms. These medicines take time to work, and it may be months before any improvement is seen.  Uncommon side effects include problems with sexual function.  Medicines to relax the muscle of the prostate. This also relieves the obstruction by reducing any compression on the urethra.This group of medicines work much faster than those that reduce the size of the prostate gland. Usually, one can experience improvement in days to weeks..  Side effects can include dizziness, fatigue, lightheadedness, and retrograde ejaculation (diminished volume of ejaculate). Several types of surgical treatments are available for relief of prostate symptoms:  Transurethral resection of the prostate (TURP) In this treatment, an instrument is inserted through opening at the tip of the penis. It is used to cut away pieces of the inner core of the prostate. The pieces are removed through the same opening of the penis. This removes the obstruction and helps get rid of the symptoms.  Transurethral incision (TUIP) In this procedure, small cuts are made in the prostate. This lessens the prostates pressure on the urethra.  Transurethral microwave thermotherapy (TUMT) This procedure uses microwaves to create heat. The heat destroys and removes a small amount of prostate tissue.  Transurethral needle ablation (TUNA) This is a procedure that uses radio frequencies to do the same as TUMT.  Interstitial laser coagulation (ILC) This is a procedure that uses a laser to do the same as TUMT and TUNA.  Transurethral electrovaporization (TUVP) This is a procedure that uses electrodes to do the same as the procedures listed above. SEEK MEDICAL CARE IF:   You develop a fever.  There is unexplained back pain.  Symptoms are not helped by medicines prescribed.  You develop side effects from the medicine you are  taking.  Your urine becomes very dark or has a bad smell.  Your lower abdomen becomes distended and you have difficulty passing your urine. SEEK IMMEDIATE MEDICAL CARE IF:   You are suddenly unable to urinate. This is an emergency. You should be seen immediately.  There are large amounts of blood or clots in the urine.  Your urinary problems become unmanageable.  You develop lightheadedness, severe dizziness, or you feel faint.  You develop moderate to severe low back or flank pain.  You develop chills or fever. Document Released: 11/03/2005 Document Revised: 08/24/2013 Document Reviewed: 05/19/2013 [facility] Patient Information 2014 [place], [place].

## 2014-04-25 ENCOUNTER — Encounter: Payer: Self-pay | Admitting: Internal Medicine

## 2014-04-25 LAB — URINE CULTURE
Colony Count: NO GROWTH
Organism ID, Bacteria: NO GROWTH

## 2014-04-27 ENCOUNTER — Encounter (HOSPITAL_COMMUNITY): Payer: Medicare Other

## 2014-04-28 ENCOUNTER — Telehealth: Payer: Self-pay | Admitting: Internal Medicine

## 2014-04-28 NOTE — Telephone Encounter (Signed)
Matt, please see message and advise.

## 2014-04-28 NOTE — Telephone Encounter (Signed)
Per Matthew's request called and spoke with pt and pt states he is just weak.  Advised that sometimes it takes a while for the weakness to get better.  Advises pt that if he has worsening SOB, fatigue, or new symptoms such as a fever he needs to seek medical help.  Pt verbalized understanding. Pt states he will continue to monitor over the weekend and call for an appointment if needed.

## 2014-04-28 NOTE — Telephone Encounter (Signed)
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  SOB started with high pollen count, comes and goes - yesterday okay 6/11, today problem - had to stop and get breath after walking 75 feet to mail box.  These symptoms of fatigue and SOB usually worse in the mornings, lessen as the day goes on.  Occassionally coughing up clear mucus.  Just does not feel he is improved after a week of Cipro BID.  Said MD told him not to make appointment but to call if symptoms not improving.  Triaged in Breathing Problems Guideline - [facility] provider within 8 hours.    Planning Stress test in about 2 weeks.  Asking what he needs to do now. Please review and contact patient at  [phone].

## 2014-05-09 ENCOUNTER — Ambulatory Visit (HOSPITAL_COMMUNITY): Payer: Medicare Other | Attending: Cardiology | Admitting: Radiology

## 2014-05-09 ENCOUNTER — Ambulatory Visit (INDEPENDENT_AMBULATORY_CARE_PROVIDER_SITE_OTHER): Payer: Medicare Other | Admitting: Interventional Cardiology

## 2014-05-09 ENCOUNTER — Encounter: Payer: Self-pay | Admitting: Interventional Cardiology

## 2014-05-09 VITALS — BP 143/90 | Ht 72.0 in | Wt 189.0 lb

## 2014-05-09 VITALS — BP 140/84 | HR 71 | Ht 72.0 in | Wt 190.0 lb

## 2014-05-09 DIAGNOSIS — I4949 Other premature depolarization: Secondary | ICD-10-CM

## 2014-05-09 DIAGNOSIS — R079 Chest pain, unspecified: Secondary | ICD-10-CM | POA: Insufficient documentation

## 2014-05-09 DIAGNOSIS — I10 Essential (primary) hypertension: Secondary | ICD-10-CM | POA: Insufficient documentation

## 2014-05-09 DIAGNOSIS — I209 Angina pectoris, unspecified: Secondary | ICD-10-CM

## 2014-05-09 DIAGNOSIS — R0602 Shortness of breath: Secondary | ICD-10-CM | POA: Insufficient documentation

## 2014-05-09 DIAGNOSIS — R42 Dizziness and giddiness: Secondary | ICD-10-CM | POA: Insufficient documentation

## 2014-05-09 DIAGNOSIS — Z09 Encounter for follow-up examination after completed treatment for conditions other than malignant neoplasm: Secondary | ICD-10-CM

## 2014-05-09 DIAGNOSIS — E785 Hyperlipidemia, unspecified: Secondary | ICD-10-CM

## 2014-05-09 DIAGNOSIS — R002 Palpitations: Secondary | ICD-10-CM

## 2014-05-09 DIAGNOSIS — R9439 Abnormal result of other cardiovascular function study: Secondary | ICD-10-CM | POA: Insufficient documentation

## 2014-05-09 DIAGNOSIS — Z8249 Family history of ischemic heart disease and other diseases of the circulatory system: Secondary | ICD-10-CM | POA: Insufficient documentation

## 2014-05-09 DIAGNOSIS — Z87891 Personal history of nicotine dependence: Secondary | ICD-10-CM | POA: Insufficient documentation

## 2014-05-09 LAB — CBC WITH DIFFERENTIAL/PLATELET
Basophils Absolute: 0 10*3/uL (ref 0.0–0.1)
Basophils Relative: 0.3 % (ref 0.0–3.0)
Eosinophils Absolute: 0.2 10*3/uL (ref 0.0–0.7)
Eosinophils Relative: 2.9 % (ref 0.0–5.0)
HCT: 39 % (ref 39.0–52.0)
Hemoglobin: 12.9 g/dL — ABNORMAL LOW (ref 13.0–17.0)
Lymphocytes Relative: 33.2 % (ref 12.0–46.0)
Lymphs Abs: 1.9 10*3/uL (ref 0.7–4.0)
MCHC: 33 g/dL (ref 30.0–36.0)
MCV: 87.2 fl (ref 78.0–100.0)
Monocytes Absolute: 0.5 10*3/uL (ref 0.1–1.0)
Monocytes Relative: 9.3 % (ref 3.0–12.0)
Neutro Abs: 3.2 10*3/uL (ref 1.4–7.7)
Neutrophils Relative %: 54.3 % (ref 43.0–77.0)
Platelets: 298 10*3/uL (ref 150.0–400.0)
RBC: 4.48 Mil/uL (ref 4.22–5.81)
RDW: 14.4 % (ref 11.5–15.5)
WBC: 5.8 10*3/uL (ref 4.0–10.5)

## 2014-05-09 LAB — BASIC METABOLIC PANEL
BUN: 25 mg/dL — ABNORMAL HIGH (ref 6–23)
CO2: 26 mEq/L (ref 19–32)
Calcium: 9.5 mg/dL (ref 8.4–10.5)
Chloride: 104 mEq/L (ref 96–112)
Creatinine, Ser: 1 mg/dL (ref 0.4–1.5)
GFR: 73.83 mL/min (ref >60.00)
Glucose, Bld: 115 mg/dL — ABNORMAL HIGH (ref 70–99)
Potassium: 4.7 mEq/L (ref 3.5–5.1)
Sodium: 139 mEq/L (ref 135–145)

## 2014-05-09 LAB — PROTIME-INR
INR: 1 ratio (ref 0.8–1.0)
Prothrombin Time: 11.2 s (ref 9.6–13.1)

## 2014-05-09 MED ORDER — TECHNETIUM TC 99M SESTAMIBI GENERIC - CARDIOLITE
10.8000 | Freq: Once | INTRAVENOUS | Status: AC | PRN
  Administered 2014-05-09: 11 via INTRAVENOUS

## 2014-05-09 MED ORDER — REGADENOSON 0.4 MG/5ML IV SOLN
0.4000 mg | Freq: Once | INTRAVENOUS | Status: AC
Start: 2014-05-09 — End: 2014-05-09
  Administered 2014-05-09: 0.4 mg via INTRAVENOUS

## 2014-05-09 MED ORDER — TECHNETIUM TC 99M SESTAMIBI GENERIC - CARDIOLITE
33.0000 | Freq: Once | INTRAVENOUS | Status: AC | PRN
  Administered 2014-05-09: 33 via INTRAVENOUS

## 2014-05-09 NOTE — Patient Instructions (Signed)
Your physician recommends that you continue on your current medications as directed. Please refer to the Current Medication list given to you today.  Lab Today: bmet, cbc, pt/inr  Your physician has requested that you have a cardiac catheterization. Cardiac catheterization is used to diagnose and/or treat various heart conditions. Doctors may recommend this procedure for a number of different reasons. The most common reason is to evaluate chest pain. Chest pain can be a symptom of coronary artery disease (CAD), and cardiac catheterization can show whether plaque is narrowing or blocking your heart's arteries. This procedure is also used to evaluate the valves, as well as measure the blood flow and oxygen levels in different parts of your heart. For further information please visit [email or web address]. Please follow instruction sheet, as given.

## 2014-05-09 NOTE — Progress Notes (Signed)
Patient ID: Timothy Gallagher, male   DOB: Nov 24, 1935, 78 y.o.   MRN: [id number]   Date: 05/09/2014 ID: Timothy Gallagher, DOB August 05, 1936, MRN [id number] PCP: Georgetta Haber, MD  Reason: Abnormal nuclear study  ASSESSMENT;  1. Coronary artery disease with angina pectoris and a moderate risk abnormal myocardial perfusion study. Symptoms are present despite antianginal therapy 2. Hypertension 3. Hyperlipidemia 4. Prior smoker  PLAN:  1. aspirin 81 mg per day 2. Nitroglycerin 0.4 mg sublingually when necessary chest discomfort/dyspnea unresponsive to rest 3. Coronary angiography with possible PCI was recommended and accepted by the patient. The procedure and risks including the possibility of death, myocardial infarction, emergency surgery, stroke, bleeding, allergy, among others were discussed in detail and except above the patient   SUBJECTIVE: Timothy Gallagher is a 78 y.o. male who is referred after having a stress nuclear performed today that was markedly abnormal. He gives a 6 to eight-week history of exertional dyspnea and chest tightness. He was seen several times by a physician's assistant and was given anti-inflammatory therapy. He now knows that he is unable to walk to his mailbox without dyspnea and a tight feeling in his chest. The symptoms go away with rest. He is not had syncope. He denies orthopnea. There is no prior history of heart disease. He smoked for greater than 40 years discontinuing cigarette smoking about 13 years ago. He is currently comfortable without chest discomfort.  The nuclear myocardial perfusion study demonstrated a large area of inferior and lateral diminished uptake with partial redistribution. The EF is 69% to   Allergies  Allergen Reactions  . Ciprofloxacin Swelling  . Lipitor [Atorvastatin]     REACTION: nausea and blurred vision  . Mycophenolate Mofetil     REACTION: unspecified  . Rosuvastatin     Unknown   . Amoxicillin Rash  . Penicillins  Rash    Current Outpatient Prescriptions on File Prior to Visit  Medication Sig Dispense Refill  . acetaminophen (TYLENOL) 500 MG tablet Take 500 mg by mouth as needed.      . cetirizine (ZYRTEC) 10 MG tablet Take 10 mg by mouth daily.      . cyanocobalamin (,VITAMIN B-12,) 1000 MCG/ML injection Inject 1.17ml every 3 weeks  10 mL  3  . diphenoxylate-atropine (LOMOTIL) 2.5-0.025 MG per tablet Take 1 tablet by mouth 4 (four) times daily as needed.  60 tablet  4  . fish oil-omega-3 fatty acids 1000 MG capsule Take 1 g by mouth 2 (two) times daily.       . fluticasone (FLONASE) 50 MCG/ACT nasal spray Place 1 spray into both nostrils daily.      . indapamide (LOZOL) 2.5 MG tablet Take 1 tablet (2.5 mg total) by mouth daily.  90 tablet  1  . multivitamin-iron-minerals-folic acid (CENTRUM) chewable tablet Chew 1 tablet by mouth daily.      . nadolol (CORGARD) 20 MG tablet Take 20 mg by mouth 2 (two) times daily.      . Red Yeast Rice 600 MG CAPS Take 1 capsule by mouth 2 (two) times daily.       . tamsulosin (FLOMAX) 0.4 MG CAPS capsule Take 1 capsule by mouth daily.      [name] Kitchen telmisartan (MICARDIS) 80 MG tablet Take 1 tablet (80 mg total) by mouth daily.  90 tablet  3  . temazepam (RESTORIL) 15 MG capsule TAKE ONE CAPSULE BY MOUTH AT BEDTIME AS NEEDED FOR SLEEP  30 capsule  5   No  current facility-administered medications on file prior to visit.    Past Medical History  Diagnosis Date  . ACNE ROSACEA 06/27/2009  . ACTINIC KERATOSIS, HEAD 04/18/2009  . Acute maxillary sinusitis 05/14/2010  . ALLERGIC RHINITIS 04/09/2007  . B12 DEFICIENCY 06/07/2007  . BACK PAIN WITH RADICULOPATHY 04/24/2008  . CHEST WALL PAIN, ACUTE 06/15/2009  . Chronic maxillary sinusitis 05/29/2008  . COLITIS 04/27/2009  . COLONIC POLYPS, HX OF 04/27/2009    tubular adenomas  . DERMATITIS, ATOPIC 10/12/2007  . DIVERTICULOSIS, COLON 04/27/2009  . ECCHYMOSES, SPONTANEOUS 06/27/2008  . Elevated sedimentation rate 05/02/2009  .  ESOPHAGEAL STRICTURE 04/27/2009  . GASTRITIS, CHRONIC 04/27/2009  . HYPERLIPIDEMIA 03/13/2008  . HYPERTENSION 04/09/2007  . Irritable bowel syndrome 08/11/2007  . KIDNEY DISEASE 04/09/2007  . NECK PAIN 09/14/2008  . NEUROPATHY, IDIOPATHIC PERIPHERAL NEC 08/11/2007  . OSTEOARTHRITIS, WRIST, RIGHT 08/05/2010  . Cancer     skin    Past Surgical History  Procedure Laterality Date  . Hernia repair    . Lamenectomy    . Lumbar fusion    . Tonsillectomy    . Incision and drainage wound with foreign body removal Left 12/20/2013    Procedure: INCISION AND DRAINAGE LEFT INDEX FINGER;  Surgeon: Tennis Must, MD;  Location: [facility];  Service: Orthopedics;  Laterality: Left;  . Esophagogastroduodenoscopy    . Colonoscopy    . Flexible sigmoidoscopy      History   Social History  . Marital Status: Married    Spouse Name: N/A    Number of Children: 4  . Years of Education: N/A   Occupational History  . retired     from [employer]  . Smoking status: Former Smoker    Quit date: 11/17/2001  . Smokeless tobacco: Never Used  . Alcohol Use: No  . Drug Use: No  . Sexual Activity: Yes   Other Topics Concern  . Not on file   Social History Narrative  . No narrative on file    Family History  Problem Relation Age of Onset  . Hernia Mother   . Heart disease Father   . COPD Father   . Colon cancer Neg Hx     ROS: He faces historical background. Never had chest pain prior to approximately 6 weeks ago. Former heavy smoker. Family history of coronary artery disease. Denies orthopnea, PND, and edema. No claudication. No neurological complaints.. Other systems negative for complaints.  OBJECTIVE: BP 140/84  Pulse 71  Ht 6' (1.829 m)  Wt 190 lb (86.183 kg)  BMI 25.76 kg/m2,  General: No acute distress, slender, appears in good health HEENT: normal without jaundice or pallor Neck: JVD flat. Carotids absent Chest: Clear Cardiac: Murmur: None. Gallop: S4.  Rhythm: Normal. Other: Normal Abdomen: Bruit: Absent. Pulsation:  absent Extremities: Edema:  absent. Pulses: 2+ and symmetric Neuro: Normal Psych: Normal  ECG: No acute ST-T wave abnormality, normal sinus rhythm.  Myocardial perfusion study: 05/09/2014: Exercise-induced ST segment depression after 4 minutes. Post exercise further ST segment depression and T-wave inversion. Exertional chest discomfort. A large region of partially reversing inferolateral underperfusion.

## 2014-05-09 NOTE — Progress Notes (Signed)
[facility] [address] Timothy Gallagher, [place] [address] [phone]    Cardiology Nuclear Med Study  Timothy Gallagher is a 78 y.o. male     MRN : [id number]     DOB: 09-14-1936  Procedure Date: 05/09/2014  Nuclear Med Background Indication for Stress Test:  Evaluation for Ischemia History:  No H/O CAD, 04/25/12 Adenosine NL EF: 70% Cardiac Risk Factors: Family History - CAD, History of Smoking and Hypertension  Symptoms:  Chest Pain, DOE, Palpitations and SOB   Nuclear Pre-Procedure Caffeine/Decaff Intake:  None> 12 hrs NPO After: 6:30am   Lungs:  clear O2 Sat: 96% on room air. IV 0.9% NS with Angio Cath:  22g  IV Site: R Antecubital x 1, tolerated well IV Started by:  Irven Baltimore, RN  Chest Size (in):  44 Cup Size: n/a  Height: 6' (1.829 m)  Weight:  189 lb (85.73 kg)  BMI:  Body mass index is 25.63 kg/(m^2). Tech Comments:  Patient held Corgard x 24 hrs. , and took Micardis this am. Irven Baltimore,  RN.    Nuclear Med Study 1 or 2 day study: 1 day  Stress Test Type:  Stress  Reading MD: N/A  Order Authorizing Provider:  Margarita Rana, MD, and Kela Millin,. PAC.  Resting Radionuclide: Technetium 81m Sestamibi  Resting Radionuclide Dose: 11.0 mCi   Stress Radionuclide:  Technetium 48m Sestamibi  Stress Radionuclide Dose: 33.0 mCi           Stress Protocol Rest HR: 56 Stress HR: 129  Rest BP: 143/90 Stress BP: 171/90  Exercise Time (min): 4:01 METS: 5.8   Predicted Max HR: 145 bpm % Max HR: 88.97 bpm Rate Pressure Product: 22059   Dose of Adenosine (mg):  n/a Dose of Lexiscan: n/a mg  Dose of Atropine (mg): n/a Dose of Dobutamine: n/a mcg/kg/min (at max HR)  Stress Test Technologist: Perrin Maltese, EMT-P  Nuclear Technologist:  Charlton Amor, CNMT     Rest Procedure:  Myocardial perfusion imaging was performed at rest 45 minutes following the intravenous administration of Technetium 94m Sestamibi. Rest ECG: NSR-LVH  Stress  Procedure:  The patient exercised on the treadmill utilizing the Bruce Protocol for 4:01 minutes. The patient stopped due to sob, fatigue, and denied any chest pain.  Technetium 32m Sestamibi was injected at peak exercise and myocardial perfusion imaging was performed after a brief delay. Stress ECG: No significant change from baseline ECG  QPS Raw Data Images:  Patient motion noted. Stress Images:  There is decreased uptake in the inferior wall. Rest Images:  There is decreased uptake in the inferior wall. Subtraction (SDS):  mixed infarct/ishemia Transient Ischemic Dilatation (Normal <1.22):  0.87 Lung/Heart Ratio (Normal <0.45):  0.27  Quantitative Gated Spect Images QGS EDV:  91 ml QGS ESV:  28 ml  Impression Exercise Capacity:  Fair exercise capacity. BP Response:  Normal blood pressure response. Clinical Symptoms:  There is dyspnea. ECG Impression:  No significant ST segment change suggestive of ischemia. Comparison with Prior Nuclear Study: No images to compare  Overall Impression:  Low risk stress nuclear study Moderate sized inferior wall infarct from apex to base with mild peri infarct ischemia.  LV Ejection Fraction: 69%.  LV Wall Motion:  NL LV Function; NL Wall Motion    Timothy Gallagher

## 2014-05-10 ENCOUNTER — Encounter (HOSPITAL_COMMUNITY): Payer: Self-pay | Admitting: Pharmacy Technician

## 2014-05-10 ENCOUNTER — Other Ambulatory Visit: Payer: Self-pay | Admitting: Interventional Cardiology

## 2014-05-10 DIAGNOSIS — I209 Angina pectoris, unspecified: Secondary | ICD-10-CM

## 2014-05-12 ENCOUNTER — Ambulatory Visit: Payer: Medicare Other | Admitting: *Deleted

## 2014-05-12 ENCOUNTER — Ambulatory Visit (HOSPITAL_COMMUNITY)
Admission: RE | Admit: 2014-05-12 | Discharge: 2014-05-12 | Disposition: A | Discharge status: Home / Self Care | Payer: Medicare Other | Source: Ambulatory Visit | Attending: Interventional Cardiology | Admitting: Interventional Cardiology

## 2014-05-12 ENCOUNTER — Encounter (HOSPITAL_COMMUNITY)
Admission: RE | Disposition: A | Discharge status: Home / Self Care | Payer: Self-pay | Source: Ambulatory Visit | Attending: Interventional Cardiology

## 2014-05-12 ENCOUNTER — Other Ambulatory Visit: Payer: Self-pay

## 2014-05-12 DIAGNOSIS — I2510 Atherosclerotic heart disease of native coronary artery without angina pectoris: Secondary | ICD-10-CM | POA: Insufficient documentation

## 2014-05-12 DIAGNOSIS — R9439 Abnormal result of other cardiovascular function study: Secondary | ICD-10-CM

## 2014-05-12 DIAGNOSIS — E785 Hyperlipidemia, unspecified: Secondary | ICD-10-CM | POA: Insufficient documentation

## 2014-05-12 DIAGNOSIS — I209 Angina pectoris, unspecified: Secondary | ICD-10-CM

## 2014-05-12 DIAGNOSIS — Z8249 Family history of ischemic heart disease and other diseases of the circulatory system: Secondary | ICD-10-CM | POA: Insufficient documentation

## 2014-05-12 DIAGNOSIS — Z87891 Personal history of nicotine dependence: Secondary | ICD-10-CM | POA: Insufficient documentation

## 2014-05-12 DIAGNOSIS — I10 Essential (primary) hypertension: Secondary | ICD-10-CM | POA: Insufficient documentation

## 2014-05-12 HISTORY — PX: LEFT HEART CATHETERIZATION WITH CORONARY ANGIOGRAM: SHX5451

## 2014-05-12 HISTORY — DX: Atherosclerotic heart disease of native coronary artery without angina pectoris: I25.10

## 2014-05-12 SURGERY — LEFT HEART CATHETERIZATION WITH CORONARY ANGIOGRAM
Anesthesia: LOCAL

## 2014-05-12 MED ORDER — SODIUM CHLORIDE 0.9 % IV SOLN: INTRAVENOUS | Status: AC

## 2014-05-12 MED ORDER — VERAPAMIL HCL 2.5 MG/ML IV SOLN
INTRAVENOUS | Status: AC
  Filled 2014-05-12: qty 2

## 2014-05-12 MED ORDER — NITROGLYCERIN 0.2 MG/ML ON CALL CATH LAB
INTRAVENOUS | Status: AC
  Filled 2014-05-12: qty 1

## 2014-05-12 MED ORDER — ASPIRIN 81 MG PO CHEW: 81.0000 mg | CHEWABLE_TABLET | ORAL | Status: DC

## 2014-05-12 MED ORDER — LIPITOR 40 MG PO TABS: 40.0000 mg | ORAL_TABLET | Freq: Every day | ORAL | Status: DC

## 2014-05-12 MED ORDER — SODIUM CHLORIDE 0.9 % IV SOLN
INTRAVENOUS | Status: DC
  Administered 2014-05-12: 07:00:00 via INTRAVENOUS

## 2014-05-12 MED ORDER — HEPARIN (PORCINE) IN NACL 2-0.9 UNIT/ML-% IJ SOLN
INTRAMUSCULAR | Status: AC
  Filled 2014-05-12: qty 1000

## 2014-05-12 MED ORDER — SODIUM CHLORIDE 0.9 % IJ SOLN: 3.0000 mL | INTRAMUSCULAR | Status: DC | PRN

## 2014-05-12 MED ORDER — SODIUM CHLORIDE 0.9 % IV SOLN: 250.0000 mL | INTRAVENOUS | Status: DC | PRN

## 2014-05-12 MED ORDER — ACETAMINOPHEN 325 MG PO TABS: 650.0000 mg | ORAL_TABLET | ORAL | Status: DC | PRN

## 2014-05-12 MED ORDER — FENTANYL CITRATE 0.05 MG/ML IJ SOLN
INTRAMUSCULAR | Status: AC
  Filled 2014-05-12: qty 2

## 2014-05-12 MED ORDER — LIDOCAINE HCL (PF) 1 % IJ SOLN
INTRAMUSCULAR | Status: AC
Start: 2014-05-12 — End: 2014-05-12
  Filled 2014-05-12: qty 30

## 2014-05-12 MED ORDER — ONDANSETRON HCL 4 MG/2ML IJ SOLN: 4.0000 mg | Freq: Four times a day (QID) | INTRAMUSCULAR | Status: DC | PRN

## 2014-05-12 MED ORDER — MIDAZOLAM HCL 2 MG/2ML IJ SOLN
INTRAMUSCULAR | Status: AC
  Filled 2014-05-12: qty 2

## 2014-05-12 MED ORDER — HEPARIN SODIUM (PORCINE) 1000 UNIT/ML IJ SOLN
INTRAMUSCULAR | Status: AC
  Filled 2014-05-12: qty 1

## 2014-05-12 MED ORDER — NITROGLYCERIN 0.4 MG SL SUBL: 0.4000 mg | SUBLINGUAL_TABLET | SUBLINGUAL | Status: DC | PRN

## 2014-05-12 MED ORDER — SODIUM CHLORIDE 0.9 % IJ SOLN: 3.0000 mL | Freq: Two times a day (BID) | INTRAMUSCULAR | Status: DC

## 2014-05-12 NOTE — CV Procedure (Signed)
     Left Heart Catheterization with Coronary Angiography  Report  Timothy Gallagher  78 y.o.  male 1936-05-03  Procedure Date: 05/12/2014 Referring Physician: Benay Pillow, M.D. Primary Cardiologist:H. Raye Sorrow, III, M.D  INDICATIONS: Moderate risk myocardial perfusion study and angina pectoris, class III.  PROCEDURE: 1. Left heart catheterization; 2. Coronary angiography; 3. Left ventricular  CONSENT:  The risks, benefits, and details of the procedure were explained in detail to the patient. Risks including death, stroke, heart attack, kidney injury, allergy, limb ischemia, bleeding and radiation injury were discussed.  The patient verbalized understanding and wanted to proceed.  Informed written consent was obtained.  PROCEDURE TECHNIQUE:  After Xylocaine anesthesia a 5 French sheath was placed in the right radial artery with an angiocath and the modified Seldinger technique.  Coronary angiography was done using a 5 F JR 4 and JL 3.5 diagnostic catheters.  Left ventriculography was done using the JR 4 catheter and hand injection.   Digital images reviewed. Significant double vessel coronary disease including diffuse right coronary and proximal LAD diagonal disease.   CONTRAST:  Total of 100 cc.  COMPLICATIONS:  None   HEMODYNAMICS:  Aortic pressure 130/70 mmHg; LV pressure 133/6 mmHg; LVEDP 14 mm mercury  ANGIOGRAPHIC DATA:   The left main coronary artery is normal. The distal left main contains a "stump" that likely represents a small twin LAD origin. The functional LAD is supplied by a large first diagonal that is described as LAD below.  The left anterior descending artery is ostial 80%. Distal LAD contains a 70-80% stenosis before the distal/apical segment. The diagonal contains segmental 90% stenosis proximal to the bifurcation into 2 large branches.  The left circumflex artery is severely diseased with mid to distal 70% stenosis before a large branch and obtuse  marginal.  The right coronary artery is dominant and there is severe multifocal 90% segmental disease. A large PDA and two left ventricular branches are widely patent. Minimal septal perforator collaterals are noted to the LAD.   LEFT VENTRICULOGRAM:  Left ventricular angiogram was done in the 30 RAO projection and revealed normal EF of 55%.   IMPRESSIONS:  1. Severe three-vessel CAD involving the RCA, LAD diagonal system, and distal circumflex 2. Preserved left ventricular systolic function with an EF of 55%   RECOMMENDATION:  Discharge today. Add statin therapy and sublingual nitroglycerin.  TCTS outpatient consult within the next 5-7 days to plan coronary bypass grafting.

## 2014-05-12 NOTE — Interval H&P Note (Signed)
Cath Lab Visit (complete for each Cath Lab visit)  Clinical Evaluation Leading to the Procedure:   ACS: No.  Non-ACS:    Anginal Classification: CCS III  Anti-ischemic medical therapy: Minimal Therapy (1 class of medications)  Non-Invasive Test Results: Intermediate-risk stress test findings: cardiac mortality 1-3%/year  Prior CABG: No previous CABG      History and Physical Interval Note:  05/12/2014 8:17 AM  Timothy Gallagher  has presented today for surgery, with the diagnosis of abnormal nuc  The various methods of treatment have been discussed with the patient and family. After consideration of risks, benefits and other options for treatment, the patient has consented to  Procedure(s): LEFT HEART CATHETERIZATION WITH CORONARY ANGIOGRAM (N/A) as a surgical intervention .  The patient's history has been reviewed, patient examined, no change in status, stable for surgery.  I have reviewed the patient's chart and labs.  Questions were answered to the patient's satisfaction.     Sinclair Grooms

## 2014-05-12 NOTE — H&P (View-Only) (Signed)
Patient ID: Timothy Gallagher, male   DOB: January 07, 1936, 78 y.o.   MRN: [id number]   Date: 05/09/2014 ID: Timothy Gallagher, DOB 06/02/1936, MRN [id number] PCP: Georgetta Haber, MD  Reason: Abnormal nuclear study  ASSESSMENT;  1. Coronary artery disease with angina pectoris and a moderate risk abnormal myocardial perfusion study. Symptoms are present despite antianginal therapy 2. Hypertension 3. Hyperlipidemia 4. Prior smoker  PLAN:  1. aspirin 81 mg per day 2. Nitroglycerin 0.4 mg sublingually when necessary chest discomfort/dyspnea unresponsive to rest 3. Coronary angiography with possible PCI was recommended and accepted by the patient. The procedure and risks including the possibility of death, myocardial infarction, emergency surgery, stroke, bleeding, allergy, among others were discussed in detail and except above the patient   SUBJECTIVE: Timothy Gallagher is a 78 y.o. male who is referred after having a stress nuclear performed today that was markedly abnormal. He gives a 6 to eight-week history of exertional dyspnea and chest tightness. He was seen several times by a physician's assistant and was given anti-inflammatory therapy. He now knows that he is unable to walk to his mailbox without dyspnea and a tight feeling in his chest. The symptoms go away with rest. He is not had syncope. He denies orthopnea. There is no prior history of heart disease. He smoked for greater than 40 years discontinuing cigarette smoking about 13 years ago. He is currently comfortable without chest discomfort.  The nuclear myocardial perfusion study demonstrated a large area of inferior and lateral diminished uptake with partial redistribution. The EF is 69% to   Allergies  Allergen Reactions  . Ciprofloxacin Swelling  . Lipitor [Atorvastatin]     REACTION: nausea and blurred vision  . Mycophenolate Mofetil     REACTION: unspecified  . Rosuvastatin     Unknown   . Amoxicillin Rash  . Penicillins  Rash    Current Outpatient Prescriptions on File Prior to Visit  Medication Sig Dispense Refill  . acetaminophen (TYLENOL) 500 MG tablet Take 500 mg by mouth as needed.      . cetirizine (ZYRTEC) 10 MG tablet Take 10 mg by mouth daily.      . cyanocobalamin (,VITAMIN B-12,) 1000 MCG/ML injection Inject 1.26ml every 3 weeks  10 mL  3  . diphenoxylate-atropine (LOMOTIL) 2.5-0.025 MG per tablet Take 1 tablet by mouth 4 (four) times daily as needed.  60 tablet  4  . fish oil-omega-3 fatty acids 1000 MG capsule Take 1 g by mouth 2 (two) times daily.       . fluticasone (FLONASE) 50 MCG/ACT nasal spray Place 1 spray into both nostrils daily.      . indapamide (LOZOL) 2.5 MG tablet Take 1 tablet (2.5 mg total) by mouth daily.  90 tablet  1  . multivitamin-iron-minerals-folic acid (CENTRUM) chewable tablet Chew 1 tablet by mouth daily.      . nadolol (CORGARD) 20 MG tablet Take 20 mg by mouth 2 (two) times daily.      . Red Yeast Rice 600 MG CAPS Take 1 capsule by mouth 2 (two) times daily.       . tamsulosin (FLOMAX) 0.4 MG CAPS capsule Take 1 capsule by mouth daily.      [name] Kitchen telmisartan (MICARDIS) 80 MG tablet Take 1 tablet (80 mg total) by mouth daily.  90 tablet  3  . temazepam (RESTORIL) 15 MG capsule TAKE ONE CAPSULE BY MOUTH AT BEDTIME AS NEEDED FOR SLEEP  30 capsule  5   No  current facility-administered medications on file prior to visit.    Past Medical History  Diagnosis Date  . ACNE ROSACEA 06/27/2009  . ACTINIC KERATOSIS, HEAD 04/18/2009  . Acute maxillary sinusitis 05/14/2010  . ALLERGIC RHINITIS 04/09/2007  . B12 DEFICIENCY 06/07/2007  . BACK PAIN WITH RADICULOPATHY 04/24/2008  . CHEST WALL PAIN, ACUTE 06/15/2009  . Chronic maxillary sinusitis 05/29/2008  . COLITIS 04/27/2009  . COLONIC POLYPS, HX OF 04/27/2009    tubular adenomas  . DERMATITIS, ATOPIC 10/12/2007  . DIVERTICULOSIS, COLON 04/27/2009  . ECCHYMOSES, SPONTANEOUS 06/27/2008  . Elevated sedimentation rate 05/02/2009  .  ESOPHAGEAL STRICTURE 04/27/2009  . GASTRITIS, CHRONIC 04/27/2009  . HYPERLIPIDEMIA 03/13/2008  . HYPERTENSION 04/09/2007  . Irritable bowel syndrome 08/11/2007  . KIDNEY DISEASE 04/09/2007  . NECK PAIN 09/14/2008  . NEUROPATHY, IDIOPATHIC PERIPHERAL NEC 08/11/2007  . OSTEOARTHRITIS, WRIST, RIGHT 08/05/2010  . Cancer     skin    Past Surgical History  Procedure Laterality Date  . Hernia repair    . Lamenectomy    . Lumbar fusion    . Tonsillectomy    . Incision and drainage wound with foreign body removal Left 12/20/2013    Procedure: INCISION AND DRAINAGE LEFT INDEX FINGER;  Surgeon: Tennis Must, MD;  Location: [facility];  Service: Orthopedics;  Laterality: Left;  . Esophagogastroduodenoscopy    . Colonoscopy    . Flexible sigmoidoscopy      History   Social History  . Marital Status: Married    Spouse Name: N/A    Number of Children: 4  . Years of Education: N/A   Occupational History  . retired     from [employer]  . Smoking status: Former Smoker    Quit date: 11/17/2001  . Smokeless tobacco: Never Used  . Alcohol Use: No  . Drug Use: No  . Sexual Activity: Yes   Other Topics Concern  . Not on file   Social History Narrative  . No narrative on file    Family History  Problem Relation Age of Onset  . Hernia Mother   . Heart disease Father   . COPD Father   . Colon cancer Neg Hx     ROS: He faces historical background. Never had chest pain prior to approximately 6 weeks ago. Former heavy smoker. Family history of coronary artery disease. Denies orthopnea, PND, and edema. No claudication. No neurological complaints.. Other systems negative for complaints.  OBJECTIVE: BP 140/84  Pulse 71  Ht 6' (1.829 m)  Wt 190 lb (86.183 kg)  BMI 25.76 kg/m2,  General: No acute distress, slender, appears in good health HEENT: normal without jaundice or pallor Neck: JVD flat. Carotids absent Chest: Clear Cardiac: Murmur: None. Gallop: S4.  Rhythm: Normal. Other: Normal Abdomen: Bruit: Absent. Pulsation:  absent Extremities: Edema:  absent. Pulses: 2+ and symmetric Neuro: Normal Psych: Normal  ECG: No acute ST-T wave abnormality, normal sinus rhythm.  Myocardial perfusion study: 05/09/2014: Exercise-induced ST segment depression after 4 minutes. Post exercise further ST segment depression and T-wave inversion. Exertional chest discomfort. A large region of partially reversing inferolateral underperfusion.

## 2014-05-12 NOTE — Discharge Instructions (Signed)
Appointment to see Heart Surgeon at [facility] Dr. [name] on Tuesday May 16, 2014 at 2pm [address] [place], [place] [address] [phone] Radial Site Care  Refer to this sheet in the next few weeks. These instructions provide you with information on caring for yourself after your procedure. Your caregiver may also give you more specific instructions. Your treatment has been planned according to current medical practices, but problems sometimes occur. Call your caregiver if you have any problems or questions after your procedure. HOME CARE INSTRUCTIONS  You may shower the day after the procedure.Remove the bandage (dressing) and gently wash the site with plain soap and water.Gently pat the site dry.  Do not apply powder or lotion to the site.  Do not submerge the affected site in water for 3 to 5 days.  Inspect the site at least twice daily.  Do not flex or bend the affected arm for 24 hours.  No lifting over 5 pounds (2.3 kg) for 5 days after your procedure.  Do not drive home if you are discharged the same day of the procedure. Have someone else drive you.  You may drive 24 hours after the procedure unless otherwise instructed by your caregiver.  Do not operate machinery or power tools for 24 hours.  A responsible adult should be with you for the first 24 hours after you arrive home. What to expect:  Any bruising will usually fade within 1 to 2 weeks.  Blood that collects in the tissue (hematoma) may be painful to the touch. It should usually decrease in size and tenderness within 1 to 2 weeks. SEEK IMMEDIATE MEDICAL CARE IF:  You have unusual pain at the radial site.  You have redness, warmth, swelling, or pain at the radial site.  You have drainage (other than a small amount of blood on the dressing).  You have chills.  You have a fever or persistent symptoms for more than 72 hours.  You have a fever and your symptoms suddenly get worse.  Your arm becomes  pale, cool, tingly, or numb.  You have heavy bleeding from the site. Hold pressure on the site. Document Released: 12/06/2010 Document Revised: 01/26/2012 Document Reviewed: 12/06/2010 [facility] Patient Information 2015 [place], [place]. This information is not intended to replace advice given to you by your health care provider. Make sure you discuss any questions you have with your health care provider.

## 2014-05-15 ENCOUNTER — Ambulatory Visit: Payer: Medicare Other | Admitting: Physician Assistant

## 2014-05-15 ENCOUNTER — Ambulatory Visit (INDEPENDENT_AMBULATORY_CARE_PROVIDER_SITE_OTHER): Payer: Medicare Other | Admitting: *Deleted

## 2014-05-15 DIAGNOSIS — E538 Deficiency of other specified B group vitamins: Secondary | ICD-10-CM

## 2014-05-15 MED ORDER — CYANOCOBALAMIN 1000 MCG/ML IJ SOLN
1000.0000 ug | Freq: Once | INTRAMUSCULAR | Status: AC
  Administered 2014-05-15: 1000 ug via INTRAMUSCULAR

## 2014-05-16 ENCOUNTER — Institutional Professional Consult (permissible substitution) (INDEPENDENT_AMBULATORY_CARE_PROVIDER_SITE_OTHER): Payer: Medicare Other | Admitting: Thoracic Surgery (Cardiothoracic Vascular Surgery)

## 2014-05-16 ENCOUNTER — Other Ambulatory Visit: Payer: Self-pay | Admitting: *Deleted

## 2014-05-16 ENCOUNTER — Encounter: Payer: Self-pay | Admitting: Thoracic Surgery (Cardiothoracic Vascular Surgery)

## 2014-05-16 VITALS — BP 128/74 | HR 67 | Resp 19 | Ht 73.0 in | Wt 195.0 lb

## 2014-05-16 DIAGNOSIS — I2510 Atherosclerotic heart disease of native coronary artery without angina pectoris: Secondary | ICD-10-CM

## 2014-05-16 DIAGNOSIS — I209 Angina pectoris, unspecified: Secondary | ICD-10-CM

## 2014-05-16 DIAGNOSIS — I25119 Atherosclerotic heart disease of native coronary artery with unspecified angina pectoris: Secondary | ICD-10-CM

## 2014-05-16 NOTE — Progress Notes (Signed)
Timothy Gallagher       [place], [id number]             [id number]     CARDIOTHORACIC SURGERY CONSULTATION REPORT  Referring Provider is Sinclair Grooms, MD PCP is Georgetta Haber, MD  Chief Complaint  Patient presents with  . Coronary Artery Disease    CAD, cath 6/26, eval for CABG    HPI:  Patient is a 78 year old Retired white male from [place] with no previous history of coronary artery disease but risk factors notable for history of hypertension, hyperlipidemia, and remote history of tobacco abuse.  The patient states that over the past 4-5 months he has developed progressive symptoms of exertional shortness of breath. He initially attributed his symptoms to allergies, but he has noted that with regular activity he now get short of breath with any sort of strenuous exertion. He recovers quickly with rest. He has not had any associated chest pain although he has had some vague tightness.  He underwent a nuclear myocardial perfusion study that was moderate risk abnormal with a large area of inferior and lateral diminished uptake with partial redistribution.  He was seen in consultation by Dr. [name] and subsequently underwent elective diagnostic cardiac catheterization revealing severe three-vessel coronary artery disease with preserved left ventricular function. He has been referred for possible surgical revascularization.  The patient is married and lives with his wife locally and [place]. He has been retired from [employer] for 16 years.  He remains relatively active physically although he is somewhat limited because of peripheral neuropathy with chronic numbness involving both lower extremities related to multiple back surgeries in the distant past.  Over the past 6 months she has developed significant progression of symptoms of exertional shortness of breath with decreased energy and generalized weakness. He has had numerous dizzy spells without  syncope. He denies any resting shortness of breath, PND, orthopnea, or lower extremity edema.  Past Medical History  Diagnosis Date  . ACNE ROSACEA 06/27/2009  . ACTINIC KERATOSIS, HEAD 04/18/2009  . Acute maxillary sinusitis 05/14/2010  . ALLERGIC RHINITIS 04/09/2007  . B12 DEFICIENCY 06/07/2007  . BACK PAIN WITH RADICULOPATHY 04/24/2008  . CHEST WALL PAIN, ACUTE 06/15/2009  . Chronic maxillary sinusitis 05/29/2008  . COLITIS 04/27/2009  . COLONIC POLYPS, HX OF 04/27/2009    tubular adenomas  . DERMATITIS, ATOPIC 10/12/2007  . DIVERTICULOSIS, COLON 04/27/2009  . ECCHYMOSES, SPONTANEOUS 06/27/2008  . Elevated sedimentation rate 05/02/2009  . ESOPHAGEAL STRICTURE 04/27/2009  . GASTRITIS, CHRONIC 04/27/2009  . HYPERLIPIDEMIA 03/13/2008  . HYPERTENSION 04/09/2007  . Irritable bowel syndrome 08/11/2007  . KIDNEY DISEASE 04/09/2007  . NECK PAIN 09/14/2008  . NEUROPATHY, IDIOPATHIC PERIPHERAL NEC 08/11/2007  . OSTEOARTHRITIS, WRIST, RIGHT 08/05/2010  . Cancer     skin  . Coronary artery disease 05/12/2014    Cath 05/12/2014 w/ severe 3-vessel CAD and preserved LV function, EF 55%    Past Surgical History  Procedure Laterality Date  . Hernia repair    . Lamenectomy    . Lumbar fusion    . Tonsillectomy    . Incision and drainage wound with foreign body removal Left 12/20/2013    Procedure: INCISION AND DRAINAGE LEFT INDEX FINGER;  Surgeon: Tennis Must, MD;  Location: [facility];  Service: Orthopedics;  Laterality: Left;  . Esophagogastroduodenoscopy    . Colonoscopy    . Flexible sigmoidoscopy    . Varicose vein surgery Left  Family History  Problem Relation Age of Onset  . Hernia Mother   . Heart disease Father   . COPD Father   . Colon cancer Neg Hx     History   Social History  . Marital Status: Married    Spouse Name: N/A    Number of Children: 4  . Years of Education: N/A   Occupational History  . retired     from [employer]  . Smoking status:  Former Smoker    Quit date: 11/17/2001  . Smokeless tobacco: Never Used  . Alcohol Use: No  . Drug Use: No  . Sexual Activity: Yes   Other Topics Concern  . Not on file   Social History Narrative  . No narrative on file    Current Outpatient Prescriptions  Medication Sig Dispense Refill  . acetaminophen (TYLENOL) 500 MG tablet Take 500 mg by mouth daily as needed (for pain).       [name] Kitchen aspirin EC 81 MG tablet Take 81 mg by mouth daily.      . cetirizine (ZYRTEC) 10 MG tablet Take 10 mg by mouth daily as needed for allergies.       . cyanocobalamin (,VITAMIN B-12,) 1000 MCG/ML injection Inject 1.23ml every 3 weeks  10 mL  3  . diphenoxylate-atropine (LOMOTIL) 2.5-0.025 MG per tablet Take 1 tablet by mouth 4 (four) times daily as needed.  60 tablet  4  . fish oil-omega-3 fatty acids 1000 MG capsule Take 1 g by mouth 2 (two) times daily.       . fluticasone (FLONASE) 50 MCG/ACT nasal spray Place 1 spray into both nostrils 2 (two) times daily.       . indapamide (LOZOL) 2.5 MG tablet Take 1 tablet (2.5 mg total) by mouth daily.  90 tablet  1  . LIPITOR 40 MG tablet Take 1 tablet (40 mg total) by mouth daily.  30 tablet  0  . Multiple Vitamin (MULTIVITAMIN WITH MINERALS) TABS tablet Take 1 tablet by mouth daily.      . nadolol (CORGARD) 20 MG tablet Take 20 mg by mouth 2 (two) times daily.      . nitroGLYCERIN (NITROSTAT) 0.4 MG SL tablet Place 1 tablet (0.4 mg total) under the tongue every 5 (five) minutes as needed for chest pain.  25 tablet  3  . tamsulosin (FLOMAX) 0.4 MG CAPS capsule Take 1 capsule by mouth daily.      [name] Kitchen telmisartan (MICARDIS) 80 MG tablet Take 1 tablet (80 mg total) by mouth daily.  90 tablet  3  . temazepam (RESTORIL) 15 MG capsule Take 15 mg by mouth at bedtime as needed for sleep.       No current facility-administered medications for this visit.    Allergies  Allergen Reactions  . Ciprofloxacin Swelling  . Lipitor [Atorvastatin]     REACTION: nausea and  blurred vision  . Mycophenolate Mofetil     REACTION: unspecified  . Rosuvastatin     Unknown   . Amoxicillin Rash  . Penicillins Rash      Review of Systems:   General:  normal appetite, decreased energy, no weight gain, no weight loss, no fever  Cardiac:  + chest pain with exertion, no chest pain at rest, + SOB with exertion, no resting SOB, no PND, no orthopnea, no palpitations, no arrhythmia, no atrial fibrillation, no LE edema, + dizzy spells, no syncope  Respiratory:  + shortness of breath,  NO home oxygen, NO productive cough, + dry cough, no bronchitis, no wheezing, no hemoptysis, no asthma, no pain with inspiration or cough, no sleep apnea, no CPAP at night  GI:   no difficulty swallowing, no reflux, no frequent heartburn, no hiatal hernia, no abdominal pain, no constipation, no diarrhea, no hematochezia, no hematemesis, no melena  GU:   no dysuria,  + frequency, no urinary tract infection, no hematuria, no enlarged prostate, + remote h/o kidney stones, + kidney disease  Vascular:  no pain suggestive of claudication, no pain in feet, no leg cramps, + varicose veins, no DVT, no non-healing foot ulcer  Neuro:   no stroke, no TIA's, no seizures, no headaches, no temporary blindness one eye,  no slurred speech, + peripheral neuropathy, no chronic pain, no instability of gait, no memory/cognitive dysfunction  Musculoskeletal: + arthritis, no joint swelling, no myalgias, mild difficulty walking, normal mobility   Skin:   no rash, no itching, no skin infections, no pressure sores or ulcerations  Psych:   no anxiety, no depression, no nervousness, no unusual recent stress  Eyes:   no blurry vision, no floaters, no recent vision changes, + wears glasses or contacts  ENT:   no hearing loss, no loose or painful teeth, + dentures  Hematologic:  no easy bruising, no abnormal bleeding, no clotting disorder, no frequent epistaxis  Endocrine:  no diabetes, does not check CBG's at  home     Physical Exam:   BP 128/74  Pulse 67  Resp 19  Ht 6\' 1"  (1.854 m)  Wt 195 lb (88.451 kg)  BMI 25.73 kg/m2  SpO2 96%  General:    well-appearing  HEENT:  Unremarkable   Neck:   no JVD, no bruits, no adenopathy   Chest:   clear to auscultation, symmetrical breath sounds, no wheezes, no rhonchi   CV:   RRR, no murmur   Abdomen:  soft, non-tender, no masses   Extremities:  warm, well-perfused, pulses palpable, no LE edema, mild varicosities RLE  Rectal/GU  Deferred  Neuro:   Grossly non-focal and symmetrical throughout  Skin:   Clean and dry, no rashes, no breakdown   Diagnostic Tests:  Left Heart Catheterization with Coronary Angiography Report   Timothy Gallagher  78 y.o.  male  07/29/1936  Procedure Date: 05/12/2014  Referring Physician: Benay Pillow, M.D.  Primary Cardiologist:H. Raye Sorrow, III, M.D  INDICATIONS: Moderate risk myocardial perfusion study and angina pectoris, class III.  PROCEDURE: 1. Left heart catheterization; 2. Coronary angiography; 3. Left ventricular  CONSENT:  The risks, benefits, and details of the procedure were explained in detail to the patient. Risks including death, stroke, heart attack, kidney injury, allergy, limb ischemia, bleeding and radiation injury were discussed. The patient verbalized understanding and wanted to proceed. Informed written consent was obtained.  PROCEDURE TECHNIQUE: After Xylocaine anesthesia a 5 French sheath was placed in the right radial artery with an angiocath and the modified Seldinger technique. Coronary angiography was done using a 5 F JR 4 and JL 3.5 diagnostic catheters. Left ventriculography was done using the JR 4 catheter and hand injection.  Digital images reviewed. Significant double vessel coronary disease including diffuse right coronary and proximal LAD diagonal disease.  CONTRAST: Total of 100 cc.  COMPLICATIONS: None  HEMODYNAMICS: Aortic pressure 130/70 mmHg; LV pressure 133/6 mmHg; LVEDP 14 mm  mercury  ANGIOGRAPHIC DATA: The left main coronary artery is normal. The distal left main contains a "stump" that likely represents a small  twin LAD origin. The functional LAD is supplied by a large first diagonal that is described as LAD below.  The left anterior descending artery is ostial 80%. Distal LAD contains a 70-80% stenosis before the distal/apical segment. The diagonal contains segmental 90% stenosis proximal to the bifurcation into 2 large branches.  The left circumflex artery is severely diseased with mid to distal 70% stenosis before a large branch and obtuse marginal.  The right coronary artery is dominant and there is severe multifocal 90% segmental disease. A large PDA and two left ventricular branches are widely patent. Minimal septal perforator collaterals are noted to the LAD.  LEFT VENTRICULOGRAM: Left ventricular angiogram was done in the 30 RAO projection and revealed normal EF of 55%.  IMPRESSIONS: 1. Severe three-vessel CAD involving the RCA, LAD diagonal system, and distal circumflex  2. Preserved left ventricular systolic function with an EF of 55%  RECOMMENDATION: Discharge today. Add statin therapy and sublingual nitroglycerin.  [facility] outpatient consult within the next 5-7 days to plan coronary bypass grafting.     Impression:  Patient has severe three-vessel coronary artery disease with preserved left ventricular systolic function and presents with progressive symptoms of exertional shortness of breath and chest tightness consistent with angina pectoris, CCS functional class II-III.  I have personally reviewed the patient's cardiac catheterization films and agree that he would best be treated with surgical revascularization.   Plan:  I have reviewed the indications, risks, and potential benefits of coronary artery bypass grafting with the patient and his wife.  Alternative treatment strategies have been discussed.  The patient understands and accepts all potential  associated risks of surgery including but not limited to risk of death, stroke or other neurologic complication, myocardial infarction, congestive heart failure, respiratory failure, renal failure, bleeding requiring blood transfusion and/or reexploration, aortic dissection or other major vascular complication, arrhythmia, heart block or bradycardia requiring permanent pacemaker, pneumonia, pleural effusion, wound infection, pulmonary embolus or other thromboembolic complication, chronic pain or other delayed complications related to median sternotomy, or the late recurrence of symptomatic ischemic heart disease and/or congestive heart failure.  The importance of long term risk modification have been emphasized.  All questions answered.  We plan to proceed with surgery on Friday, 05/19/2014.     I spent in excess of 90 minutes during the conduct of this office consultation and >50% of this time involved direct face-to-face encounter with the patient for counseling and/or coordination of their care.    Valentina Gu. [name], MD 05/16/2014 1:51 PM

## 2014-05-17 ENCOUNTER — Encounter (HOSPITAL_COMMUNITY)
Admission: RE | Admit: 2014-05-17 | Discharge: 2014-05-17 | Disposition: A | Discharge status: Home / Self Care | Payer: Medicare Other | Source: Ambulatory Visit | Attending: Thoracic Surgery (Cardiothoracic Vascular Surgery) | Admitting: Thoracic Surgery (Cardiothoracic Vascular Surgery)

## 2014-05-17 ENCOUNTER — Ambulatory Visit (HOSPITAL_COMMUNITY)
Admission: RE | Admit: 2014-05-17 | Discharge: 2014-05-17 | Disposition: A | Discharge status: Home / Self Care | Payer: Medicare Other | Source: Ambulatory Visit | Attending: Thoracic Surgery (Cardiothoracic Vascular Surgery) | Admitting: Thoracic Surgery (Cardiothoracic Vascular Surgery)

## 2014-05-17 ENCOUNTER — Encounter (HOSPITAL_COMMUNITY): Payer: Self-pay

## 2014-05-17 VITALS — BP 109/79 | HR 52 | Temp 97.3°F | Resp 20 | Ht 73.0 in | Wt 195.0 lb

## 2014-05-17 DIAGNOSIS — Z01810 Encounter for preprocedural cardiovascular examination: Secondary | ICD-10-CM | POA: Insufficient documentation

## 2014-05-17 DIAGNOSIS — I2510 Atherosclerotic heart disease of native coronary artery without angina pectoris: Secondary | ICD-10-CM

## 2014-05-17 DIAGNOSIS — I10 Essential (primary) hypertension: Secondary | ICD-10-CM | POA: Insufficient documentation

## 2014-05-17 DIAGNOSIS — E785 Hyperlipidemia, unspecified: Secondary | ICD-10-CM | POA: Insufficient documentation

## 2014-05-17 DIAGNOSIS — Z01811 Encounter for preprocedural respiratory examination: Secondary | ICD-10-CM | POA: Insufficient documentation

## 2014-05-17 DIAGNOSIS — Z01812 Encounter for preprocedural laboratory examination: Secondary | ICD-10-CM | POA: Insufficient documentation

## 2014-05-17 DIAGNOSIS — Z01818 Encounter for other preprocedural examination: Secondary | ICD-10-CM | POA: Insufficient documentation

## 2014-05-17 LAB — BLOOD GAS, ARTERIAL
Acid-base deficit: 1.6 mmol/L (ref 0.0–2.0)
Bicarbonate: 22.4 mEq/L (ref 20.0–24.0)
Drawn by: 206361
FIO2: 0.21 %
O2 Saturation: 98.2 %
Patient temperature: 98.6
TCO2: 23.5 mmol/L (ref 0–100)
pCO2 arterial: 36.5 mmHg (ref 35.0–45.0)
pH, Arterial: 7.404 (ref 7.350–7.450)
pO2, Arterial: 88.8 mmHg (ref 80.0–100.0)

## 2014-05-17 LAB — CBC
HCT: 40.7 % (ref 39.0–52.0)
Hemoglobin: 13.3 g/dL (ref 13.0–17.0)
MCH: 28.7 pg (ref 26.0–34.0)
MCHC: 32.7 g/dL (ref 30.0–36.0)
MCV: 87.7 fL (ref 78.0–100.0)
Platelets: 157 10*3/uL (ref 150–400)
RBC: 4.64 MIL/uL (ref 4.22–5.81)
RDW: 14.3 % (ref 11.5–15.5)
WBC: 6 10*3/uL (ref 4.0–10.5)

## 2014-05-17 LAB — PULMONARY FUNCTION TEST
DL/VA % pred: 77 %
DL/VA: 3.67 ml/min/mmHg/L
DLCO cor % pred: 68 %
DLCO cor: 25.03 ml/min/mmHg
DLCO unc % pred: 68 %
DLCO unc: 25.03 ml/min/mmHg
FEF 25-75 Post: 2.04 L/sec
FEF 25-75 Pre: 1.45 L/sec
FEF2575-%Change-Post: 40 %
FEF2575-%Pred-Post: 82 %
FEF2575-%Pred-Pre: 58 %
FEV1-%Change-Post: 9 %
FEV1-%Pred-Post: 82 %
FEV1-%Pred-Pre: 75 %
FEV1-Post: 2.82 L
FEV1-Pre: 2.58 L
FEV1FVC-%Change-Post: -4 %
FEV1FVC-%Pred-Pre: 91 %
FEV6-%Change-Post: 12 %
FEV6-%Pred-Post: 97 %
FEV6-%Pred-Pre: 86 %
FEV6-Post: 4.33 L
FEV6-Pre: 3.85 L
FEV6FVC-%Change-Post: -2 %
FEV6FVC-%Pred-Post: 102 %
FEV6FVC-%Pred-Pre: 104 %
FVC-%Change-Post: 14 %
FVC-%Pred-Post: 95 %
FVC-%Pred-Pre: 83 %
FVC-Post: 4.5 L
FVC-Pre: 3.92 L
Post FEV1/FVC ratio: 63 %
Post FEV6/FVC ratio: 96 %
Pre FEV1/FVC ratio: 66 %
Pre FEV6/FVC Ratio: 98 %
RV % pred: 79 %
RV: 2.19 L
TLC % pred: 100 %
TLC: 7.65 L

## 2014-05-17 LAB — PROTIME-INR
INR: 1.04 (ref 0.00–1.49)
Prothrombin Time: 13.6 seconds (ref 11.6–15.2)

## 2014-05-17 LAB — URINALYSIS, ROUTINE W REFLEX MICROSCOPIC
Bilirubin Urine: NEGATIVE
Glucose, UA: NEGATIVE mg/dL
Hgb urine dipstick: NEGATIVE
Ketones, ur: NEGATIVE mg/dL
Leukocytes, UA: NEGATIVE
Nitrite: NEGATIVE
Protein, ur: 100 mg/dL — AB
Specific Gravity, Urine: 1.02 (ref 1.005–1.030)
Urobilinogen, UA: 0.2 mg/dL (ref 0.0–1.0)
pH: 5 (ref 5.0–8.0)

## 2014-05-17 LAB — URINE MICROSCOPIC-ADD ON

## 2014-05-17 LAB — COMPREHENSIVE METABOLIC PANEL
ALT: 12 U/L (ref 0–53)
AST: 19 U/L (ref 0–37)
Albumin: 3.6 g/dL (ref 3.5–5.2)
Alkaline Phosphatase: 69 U/L (ref 39–117)
Anion gap: 19 — ABNORMAL HIGH (ref 5–15)
BUN: 19 mg/dL (ref 6–23)
CO2: 18 mEq/L — ABNORMAL LOW (ref 19–32)
Calcium: 9.4 mg/dL (ref 8.4–10.5)
Chloride: 102 mEq/L (ref 96–112)
Creatinine, Ser: 0.85 mg/dL (ref 0.50–1.35)
GFR calc Af Amer: >90 mL/min (ref >90)
GFR calc non Af Amer: 83 mL/min — ABNORMAL LOW (ref >90)
Glucose, Bld: 115 mg/dL — ABNORMAL HIGH (ref 70–99)
Potassium: 4.2 mEq/L (ref 3.7–5.3)
Sodium: 139 mEq/L (ref 137–147)
Total Bilirubin: 0.5 mg/dL (ref 0.3–1.2)
Total Protein: 7.5 g/dL (ref 6.0–8.3)

## 2014-05-17 LAB — APTT: aPTT: 30 seconds (ref 24–37)

## 2014-05-17 LAB — HEMOGLOBIN A1C
Hgb A1c MFr Bld: 6.3 % — ABNORMAL HIGH (ref <5.7)
Mean Plasma Glucose: 134 mg/dL — ABNORMAL HIGH (ref <117)

## 2014-05-17 LAB — SURGICAL PCR SCREEN
MRSA, PCR: NEGATIVE
Staphylococcus aureus: POSITIVE — AB

## 2014-05-17 LAB — ABO/RH: ABO/RH(D): O POS

## 2014-05-17 MED ORDER — ALBUTEROL SULFATE (2.5 MG/3ML) 0.083% IN NEBU
2.5000 mg | INHALATION_SOLUTION | Freq: Once | RESPIRATORY_TRACT | Status: AC
  Administered 2014-05-17: 2.5 mg via RESPIRATORY_TRACT

## 2014-05-17 NOTE — Pre-Procedure Instructions (Signed)
JAVONTAY VANDAM  05/17/2014   Your procedure is scheduled on:  05/19/14  Report to [facility] Admitting at 530 AM.  Call this number if you have problems the morning of surgery: [phone]   Remember:   Do not eat food or drink liquids after midnight.   Take these medicines the morning of surgery with A SIP OF WATER: zytec,flonase,nadolol,flomax   Do not wear jewelry, make-up or nail polish.  Do not wear lotions, powders, or perfumes. You may wear deodorant.  Do not shave 48 hours prior to surgery. Men may shave face and neck.  Do not bring valuables to the hospital.  [facility] is not responsible                  for any belongings or valuables.               Contacts, dentures or bridgework may not be worn into surgery.  Leave suitcase in the car. After surgery it may be brought to your room.  For patients admitted to the hospital, discharge time is determined by your                treatment team.               Patients discharged the day of surgery will not be allowed to drive  home.  Name and phone number of your driver: family  Special Instructions: Shower using CHG 2 nights before surgery and the night before surgery.  If you shower the day of surgery use CHG.  Use special wash - you have one bottle of CHG for all showers.  You should use approximately 1/3 of the bottle for each shower.   Please read over the following fact sheets that you were given: Pain Booklet, Coughing and Deep Breathing, Blood Transfusion Information, MRSA Information and Surgical Site Infection Prevention

## 2014-05-18 ENCOUNTER — Encounter (HOSPITAL_COMMUNITY): Payer: Self-pay | Admitting: Certified Registered Nurse Anesthetist

## 2014-05-18 MED ORDER — PLASMA-LYTE 148 IV SOLN
INTRAVENOUS | Status: AC
  Administered 2014-05-19: 10:00:00
  Filled 2014-05-18: qty 2.5

## 2014-05-18 MED ORDER — VANCOMYCIN HCL 10 G IV SOLR
1500.0000 mg | INTRAVENOUS | Status: AC
  Administered 2014-05-19: 1500 mg via INTRAVENOUS
  Filled 2014-05-18: qty 1500

## 2014-05-18 MED ORDER — DEXTROSE 5 % IV SOLN
750.0000 mg | INTRAVENOUS | Status: DC
  Filled 2014-05-18: qty 750

## 2014-05-18 MED ORDER — MAGNESIUM SULFATE 50 % IJ SOLN
40.0000 meq | INTRAMUSCULAR | Status: DC
  Filled 2014-05-18: qty 10

## 2014-05-18 MED ORDER — POTASSIUM CHLORIDE 2 MEQ/ML IV SOLN
80.0000 meq | INTRAVENOUS | Status: DC
  Filled 2014-05-18: qty 40

## 2014-05-18 MED ORDER — DEXTROSE 5 % IV SOLN
0.5000 ug/min | INTRAVENOUS | Status: DC
  Filled 2014-05-18: qty 4

## 2014-05-18 MED ORDER — VANCOMYCIN HCL 1000 MG IV SOLR
INTRAVENOUS | Status: AC
  Administered 2014-05-19: 10:00:00
  Filled 2014-05-18: qty 1000

## 2014-05-18 MED ORDER — DOPAMINE-DEXTROSE 3.2-5 MG/ML-% IV SOLN
2.0000 ug/kg/min | INTRAVENOUS | Status: DC
  Filled 2014-05-18: qty 250

## 2014-05-18 MED ORDER — SODIUM CHLORIDE 0.9 % IV SOLN
INTRAVENOUS | Status: AC
  Administered 2014-05-19: 1.4 [IU]/h via INTRAVENOUS
  Filled 2014-05-18: qty 1

## 2014-05-18 MED ORDER — SODIUM CHLORIDE 0.9 % IV SOLN
INTRAVENOUS | Status: DC
  Filled 2014-05-18: qty 30

## 2014-05-18 MED ORDER — NITROGLYCERIN IN D5W 200-5 MCG/ML-% IV SOLN
2.0000 ug/min | INTRAVENOUS | Status: AC
  Administered 2014-05-19: 5 ug/min via INTRAVENOUS
  Filled 2014-05-18: qty 250

## 2014-05-18 MED ORDER — PHENYLEPHRINE HCL 10 MG/ML IJ SOLN
30.0000 ug/min | INTRAVENOUS | Status: DC
  Filled 2014-05-18: qty 2

## 2014-05-18 MED ORDER — AMINOCAPROIC ACID 250 MG/ML IV SOLN
INTRAVENOUS | Status: AC
  Administered 2014-05-19: 69.8 mL/h via INTRAVENOUS
  Administered 2014-05-19: 13:00:00 via INTRAVENOUS
  Filled 2014-05-18: qty 40

## 2014-05-18 MED ORDER — DEXTROSE 5 % IV SOLN
1.5000 g | INTRAVENOUS | Status: AC
  Administered 2014-05-19: .75 g via INTRAVENOUS
  Administered 2014-05-19: 1.5 g via INTRAVENOUS
  Filled 2014-05-18 (×2): qty 1.5

## 2014-05-18 MED ORDER — DEXMEDETOMIDINE HCL IN NACL 400 MCG/100ML IV SOLN
0.1000 ug/kg/h | INTRAVENOUS | Status: AC
  Administered 2014-05-19: 0.7 ug/kg/h via INTRAVENOUS
  Filled 2014-05-18: qty 100

## 2014-05-19 ENCOUNTER — Encounter (HOSPITAL_COMMUNITY): Payer: Medicare Other | Admitting: Certified Registered Nurse Anesthetist

## 2014-05-19 ENCOUNTER — Inpatient Hospital Stay (HOSPITAL_COMMUNITY): Payer: Medicare Other | Admitting: Certified Registered Nurse Anesthetist

## 2014-05-19 ENCOUNTER — Inpatient Hospital Stay (HOSPITAL_COMMUNITY): Payer: Medicare Other

## 2014-05-19 ENCOUNTER — Inpatient Hospital Stay (HOSPITAL_COMMUNITY)
Admission: RE | Admit: 2014-05-19 | Discharge: 2014-05-24 | DRG: 236 | Disposition: A | Discharge status: Home / Self Care | Payer: Medicare Other | Source: Ambulatory Visit | Attending: Thoracic Surgery (Cardiothoracic Vascular Surgery) | Admitting: Thoracic Surgery (Cardiothoracic Vascular Surgery)

## 2014-05-19 ENCOUNTER — Encounter (HOSPITAL_COMMUNITY): Payer: Self-pay | Admitting: *Deleted

## 2014-05-19 ENCOUNTER — Encounter (HOSPITAL_COMMUNITY)
Admission: RE | Disposition: A | Discharge status: Home / Self Care | Payer: Medicare Other | Source: Ambulatory Visit | Attending: Thoracic Surgery (Cardiothoracic Vascular Surgery)

## 2014-05-19 DIAGNOSIS — Z87891 Personal history of nicotine dependence: Secondary | ICD-10-CM

## 2014-05-19 DIAGNOSIS — I10 Essential (primary) hypertension: Secondary | ICD-10-CM | POA: Diagnosis present

## 2014-05-19 DIAGNOSIS — Z8249 Family history of ischemic heart disease and other diseases of the circulatory system: Secondary | ICD-10-CM

## 2014-05-19 DIAGNOSIS — D62 Acute posthemorrhagic anemia: Secondary | ICD-10-CM | POA: Diagnosis not present

## 2014-05-19 DIAGNOSIS — R209 Unspecified disturbances of skin sensation: Secondary | ICD-10-CM | POA: Diagnosis present

## 2014-05-19 DIAGNOSIS — J9819 Other pulmonary collapse: Secondary | ICD-10-CM | POA: Diagnosis not present

## 2014-05-19 DIAGNOSIS — Z7982 Long term (current) use of aspirin: Secondary | ICD-10-CM

## 2014-05-19 DIAGNOSIS — I2510 Atherosclerotic heart disease of native coronary artery without angina pectoris: Secondary | ICD-10-CM

## 2014-05-19 DIAGNOSIS — D689 Coagulation defect, unspecified: Secondary | ICD-10-CM | POA: Diagnosis not present

## 2014-05-19 DIAGNOSIS — D6959 Other secondary thrombocytopenia: Secondary | ICD-10-CM | POA: Diagnosis not present

## 2014-05-19 DIAGNOSIS — Z951 Presence of aortocoronary bypass graft: Secondary | ICD-10-CM

## 2014-05-19 DIAGNOSIS — Z860100 Personal history of colon polyps, unspecified: Secondary | ICD-10-CM

## 2014-05-19 DIAGNOSIS — F29 Unspecified psychosis not due to a substance or known physiological condition: Secondary | ICD-10-CM | POA: Diagnosis not present

## 2014-05-19 DIAGNOSIS — Z981 Arthrodesis status: Secondary | ICD-10-CM

## 2014-05-19 DIAGNOSIS — E785 Hyperlipidemia, unspecified: Secondary | ICD-10-CM | POA: Diagnosis present

## 2014-05-19 DIAGNOSIS — I498 Other specified cardiac arrhythmias: Secondary | ICD-10-CM | POA: Diagnosis not present

## 2014-05-19 DIAGNOSIS — IMO0002 Reserved for concepts with insufficient information to code with codable children: Secondary | ICD-10-CM | POA: Diagnosis not present

## 2014-05-19 DIAGNOSIS — I4891 Unspecified atrial fibrillation: Secondary | ICD-10-CM | POA: Diagnosis not present

## 2014-05-19 DIAGNOSIS — F05 Delirium due to known physiological condition: Secondary | ICD-10-CM | POA: Diagnosis not present

## 2014-05-19 DIAGNOSIS — E8779 Other fluid overload: Secondary | ICD-10-CM | POA: Diagnosis not present

## 2014-05-19 DIAGNOSIS — I209 Angina pectoris, unspecified: Secondary | ICD-10-CM | POA: Diagnosis present

## 2014-05-19 DIAGNOSIS — I25810 Atherosclerosis of coronary artery bypass graft(s) without angina pectoris: Secondary | ICD-10-CM

## 2014-05-19 HISTORY — PX: CORONARY ARTERY BYPASS GRAFT: SHX141

## 2014-05-19 HISTORY — DX: Delirium due to known physiological condition: F05

## 2014-05-19 HISTORY — PX: INTRAOPERATIVE TRANSESOPHAGEAL ECHOCARDIOGRAM: SHX5062

## 2014-05-19 HISTORY — DX: Presence of aortocoronary bypass graft: Z95.1

## 2014-05-19 LAB — POCT I-STAT 3, ART BLOOD GAS (G3+)
Acid-Base Excess: 1 mmol/L (ref 0.0–2.0)
Acid-Base Excess: 1 mmol/L (ref 0.0–2.0)
Acid-base deficit: 1 mmol/L (ref 0.0–2.0)
Acid-base deficit: 2 mmol/L (ref 0.0–2.0)
Acid-base deficit: 3 mmol/L — ABNORMAL HIGH (ref 0.0–2.0)
Bicarbonate: 26.6 mEq/L — ABNORMAL HIGH (ref 20.0–24.0)
Bicarbonate: 25 mEq/L — ABNORMAL HIGH (ref 20.0–24.0)
Bicarbonate: 25.7 mEq/L — ABNORMAL HIGH (ref 20.0–24.0)
Bicarbonate: 23.5 mEq/L (ref 20.0–24.0)
Bicarbonate: 22.6 mEq/L (ref 20.0–24.0)
O2 Saturation: 100 %
O2 Saturation: 100 %
O2 Saturation: 100 %
O2 Saturation: 100 %
O2 Saturation: 98 %
Patient temperature: 35.2
Patient temperature: 37.1
TCO2: 28 mmol/L (ref 0–100)
TCO2: 26 mmol/L (ref 0–100)
TCO2: 27 mmol/L (ref 0–100)
TCO2: 25 mmol/L (ref 0–100)
TCO2: 24 mmol/L (ref 0–100)
pCO2 arterial: 44.1 mmHg (ref 35.0–45.0)
pCO2 arterial: 36.7 mmHg (ref 35.0–45.0)
pCO2 arterial: 49.9 mmHg — ABNORMAL HIGH (ref 35.0–45.0)
pCO2 arterial: 37 mmHg (ref 35.0–45.0)
pCO2 arterial: 42.4 mmHg (ref 35.0–45.0)
pH, Arterial: 7.388 (ref 7.350–7.450)
pH, Arterial: 7.443 (ref 7.350–7.450)
pH, Arterial: 7.32 — ABNORMAL LOW (ref 7.350–7.450)
pH, Arterial: 7.403 (ref 7.350–7.450)
pH, Arterial: 7.335 — ABNORMAL LOW (ref 7.350–7.450)
pO2, Arterial: 436 mmHg — ABNORMAL HIGH (ref 80.0–100.0)
pO2, Arterial: 413 mmHg — ABNORMAL HIGH (ref 80.0–100.0)
pO2, Arterial: 329 mmHg — ABNORMAL HIGH (ref 80.0–100.0)
pO2, Arterial: 175 mmHg — ABNORMAL HIGH (ref 80.0–100.0)
pO2, Arterial: 118 mmHg — ABNORMAL HIGH (ref 80.0–100.0)

## 2014-05-19 LAB — GLUCOSE, CAPILLARY
Glucose-Capillary: 116 mg/dL — ABNORMAL HIGH (ref 70–99)
Glucose-Capillary: 129 mg/dL — ABNORMAL HIGH (ref 70–99)
Glucose-Capillary: 116 mg/dL — ABNORMAL HIGH (ref 70–99)
Glucose-Capillary: 133 mg/dL — ABNORMAL HIGH (ref 70–99)
Glucose-Capillary: 118 mg/dL — ABNORMAL HIGH (ref 70–99)
Glucose-Capillary: 120 mg/dL — ABNORMAL HIGH (ref 70–99)
Glucose-Capillary: 111 mg/dL — ABNORMAL HIGH (ref 70–99)

## 2014-05-19 LAB — POCT I-STAT 4, (NA,K, GLUC, HGB,HCT)
Glucose, Bld: 105 mg/dL — ABNORMAL HIGH (ref 70–99)
Glucose, Bld: 95 mg/dL (ref 70–99)
Glucose, Bld: 139 mg/dL — ABNORMAL HIGH (ref 70–99)
Glucose, Bld: 101 mg/dL — ABNORMAL HIGH (ref 70–99)
Glucose, Bld: 105 mg/dL — ABNORMAL HIGH (ref 70–99)
HCT: 37 % — ABNORMAL LOW (ref 39.0–52.0)
HCT: 29 % — ABNORMAL LOW (ref 39.0–52.0)
HCT: 26 % — ABNORMAL LOW (ref 39.0–52.0)
HCT: 20 % — ABNORMAL LOW (ref 39.0–52.0)
HCT: 19 % — ABNORMAL LOW (ref 39.0–52.0)
Hemoglobin: 12.6 g/dL — ABNORMAL LOW (ref 13.0–17.0)
Hemoglobin: 9.9 g/dL — ABNORMAL LOW (ref 13.0–17.0)
Hemoglobin: 8.8 g/dL — ABNORMAL LOW (ref 13.0–17.0)
Hemoglobin: 6.8 g/dL — CL (ref 13.0–17.0)
Hemoglobin: 6.5 g/dL — CL (ref 13.0–17.0)
Potassium: 3.9 mEq/L (ref 3.7–5.3)
Potassium: 4.2 mEq/L (ref 3.7–5.3)
Potassium: 5.9 mEq/L — ABNORMAL HIGH (ref 3.7–5.3)
Potassium: 4.7 mEq/L (ref 3.7–5.3)
Potassium: 3.8 mEq/L (ref 3.7–5.3)
Sodium: 141 mEq/L (ref 137–147)
Sodium: 140 mEq/L (ref 137–147)
Sodium: 139 mEq/L (ref 137–147)
Sodium: 137 mEq/L (ref 137–147)
Sodium: 142 mEq/L (ref 137–147)

## 2014-05-19 LAB — HEMOGLOBIN AND HEMATOCRIT, BLOOD
HCT: 24.9 % — ABNORMAL LOW (ref 39.0–52.0)
HCT: 24.9 % — ABNORMAL LOW (ref 39.0–52.0)
Hemoglobin: 8.3 g/dL — ABNORMAL LOW (ref 13.0–17.0)
Hemoglobin: 8.3 g/dL — ABNORMAL LOW (ref 13.0–17.0)

## 2014-05-19 LAB — CBC
HCT: 22.7 % — ABNORMAL LOW (ref 39.0–52.0)
HCT: 25.9 % — ABNORMAL LOW (ref 39.0–52.0)
Hemoglobin: 7.7 g/dL — ABNORMAL LOW (ref 13.0–17.0)
Hemoglobin: 8.9 g/dL — ABNORMAL LOW (ref 13.0–17.0)
MCH: 29.7 pg (ref 26.0–34.0)
MCH: 29.4 pg (ref 26.0–34.0)
MCHC: 33.9 g/dL (ref 30.0–36.0)
MCHC: 34.4 g/dL (ref 30.0–36.0)
MCV: 87.6 fL (ref 78.0–100.0)
MCV: 85.5 fL (ref 78.0–100.0)
Platelets: 61 10*3/uL — ABNORMAL LOW (ref 150–400)
Platelets: 102 10*3/uL — ABNORMAL LOW (ref 150–400)
RBC: 2.59 MIL/uL — ABNORMAL LOW (ref 4.22–5.81)
RBC: 3.03 MIL/uL — ABNORMAL LOW (ref 4.22–5.81)
RDW: 14.2 % (ref 11.5–15.5)
RDW: 14.2 % (ref 11.5–15.5)
WBC: 3 10*3/uL — ABNORMAL LOW (ref 4.0–10.5)
WBC: 5.7 10*3/uL (ref 4.0–10.5)

## 2014-05-19 LAB — POCT I-STAT, CHEM 8
BUN: 18 mg/dL (ref 6–23)
Calcium, Ion: 1.26 mmol/L (ref 1.13–1.30)
Chloride: 107 mEq/L (ref 96–112)
Creatinine, Ser: 0.9 mg/dL (ref 0.50–1.35)
Glucose, Bld: 105 mg/dL — ABNORMAL HIGH (ref 70–99)
HCT: 25 % — ABNORMAL LOW (ref 39.0–52.0)
Hemoglobin: 8.5 g/dL — ABNORMAL LOW (ref 13.0–17.0)
Potassium: 4.9 mEq/L (ref 3.7–5.3)
Sodium: 143 mEq/L (ref 137–147)
TCO2: 21 mmol/L (ref 0–100)

## 2014-05-19 LAB — POCT I-STAT 3, VENOUS BLOOD GAS (G3P V)
Acid-base deficit: 1 mmol/L (ref 0.0–2.0)
Bicarbonate: 26.1 mEq/L — ABNORMAL HIGH (ref 20.0–24.0)
O2 Saturation: 76 %
TCO2: 28 mmol/L (ref 0–100)
pCO2, Ven: 53.6 mmHg — ABNORMAL HIGH (ref 45.0–50.0)
pH, Ven: 7.296 (ref 7.250–7.300)
pO2, Ven: 46 mmHg — ABNORMAL HIGH (ref 30.0–45.0)

## 2014-05-19 LAB — CREATININE, SERUM
Creatinine, Ser: 0.86 mg/dL (ref 0.50–1.35)
GFR calc Af Amer: >90 mL/min (ref >90)
GFR calc non Af Amer: 83 mL/min — ABNORMAL LOW (ref >90)

## 2014-05-19 LAB — PROTIME-INR
INR: 1.47 (ref 0.00–1.49)
INR: 1.46 (ref 0.00–1.49)
Prothrombin Time: 17.8 seconds — ABNORMAL HIGH (ref 11.6–15.2)
Prothrombin Time: 17.7 seconds — ABNORMAL HIGH (ref 11.6–15.2)

## 2014-05-19 LAB — POCT ACTIVATED CLOTTING TIME
Activated Clotting Time: 118 seconds
Activated Clotting Time: 473 seconds
Activated Clotting Time: 613 seconds
Activated Clotting Time: 422 seconds

## 2014-05-19 LAB — APTT
aPTT: 36 seconds (ref 24–37)
aPTT: 38 seconds — ABNORMAL HIGH (ref 24–37)

## 2014-05-19 LAB — FIBRINOGEN: Fibrinogen: 207 mg/dL (ref 204–475)

## 2014-05-19 LAB — PLATELET COUNT
Platelets: 96 10*3/uL — ABNORMAL LOW (ref 150–400)
Platelets: 74 10*3/uL — ABNORMAL LOW (ref 150–400)

## 2014-05-19 LAB — PREPARE RBC (CROSSMATCH)

## 2014-05-19 LAB — MAGNESIUM: Magnesium: 2.9 mg/dL — ABNORMAL HIGH (ref 1.5–2.5)

## 2014-05-19 SURGERY — CORONARY ARTERY BYPASS GRAFTING (CABG)
Anesthesia: General | Site: Chest

## 2014-05-19 MED ORDER — NITROGLYCERIN IN D5W 200-5 MCG/ML-% IV SOLN: 0.0000 ug/min | INTRAVENOUS | Status: DC

## 2014-05-19 MED ORDER — ONDANSETRON HCL 4 MG/2ML IJ SOLN
4.0000 mg | Freq: Four times a day (QID) | INTRAMUSCULAR | Status: DC | PRN
  Administered 2014-05-19 – 2014-05-20 (×3): 4 mg via INTRAVENOUS
  Filled 2014-05-19 (×4): qty 2

## 2014-05-19 MED ORDER — FAMOTIDINE IN NACL 20-0.9 MG/50ML-% IV SOLN
20.0000 mg | Freq: Two times a day (BID) | INTRAVENOUS | Status: AC
Start: 2014-05-19 — End: 2014-05-19
  Administered 2014-05-19 (×2): 20 mg via INTRAVENOUS
  Filled 2014-05-19: qty 50

## 2014-05-19 MED ORDER — LACTATED RINGERS IV SOLN: 500.0000 mL | Freq: Once | INTRAVENOUS | Status: AC | PRN

## 2014-05-19 MED ORDER — ALBUMIN HUMAN 5 % IV SOLN
INTRAVENOUS | Status: DC | PRN
  Administered 2014-05-19 (×3): via INTRAVENOUS

## 2014-05-19 MED ORDER — ACETAMINOPHEN 500 MG PO TABS
1000.0000 mg | ORAL_TABLET | Freq: Four times a day (QID) | ORAL | Status: DC
  Administered 2014-05-19 – 2014-05-24 (×17): 1000 mg via ORAL
  Filled 2014-05-19 (×22): qty 2

## 2014-05-19 MED ORDER — BISACODYL 10 MG RE SUPP: 10.0000 mg | Freq: Every day | RECTAL | Status: DC

## 2014-05-19 MED ORDER — CHLORHEXIDINE GLUCONATE 4 % EX LIQD
30.0000 mL | CUTANEOUS | Status: DC
  Filled 2014-05-19: qty 30

## 2014-05-19 MED ORDER — PROPOFOL 10 MG/ML IV BOLUS
INTRAVENOUS | Status: AC
  Filled 2014-05-19: qty 20

## 2014-05-19 MED ORDER — SODIUM CHLORIDE 0.9 % IJ SOLN
OROMUCOSAL | Status: DC | PRN
  Administered 2014-05-19 (×6): via TOPICAL

## 2014-05-19 MED ORDER — LACTATED RINGERS IV SOLN: INTRAVENOUS | Status: DC

## 2014-05-19 MED ORDER — CALCIUM CHLORIDE 10 % IV SOLN
INTRAVENOUS | Status: AC
  Filled 2014-05-19: qty 10

## 2014-05-19 MED ORDER — SODIUM CHLORIDE 0.9 % IJ SOLN
3.0000 mL | Freq: Two times a day (BID) | INTRAMUSCULAR | Status: DC
  Administered 2014-05-20 – 2014-05-23 (×6): 3 mL via INTRAVENOUS

## 2014-05-19 MED ORDER — FENTANYL CITRATE 0.05 MG/ML IJ SOLN
INTRAMUSCULAR | Status: AC
  Filled 2014-05-19: qty 5

## 2014-05-19 MED ORDER — PROTAMINE SULFATE 10 MG/ML IV SOLN
INTRAVENOUS | Status: DC | PRN
  Administered 2014-05-19: 270 mg via INTRAVENOUS

## 2014-05-19 MED ORDER — 0.9 % SODIUM CHLORIDE (POUR BTL) OPTIME
TOPICAL | Status: DC | PRN
  Administered 2014-05-19 (×2): 1000 mL

## 2014-05-19 MED ORDER — ACETAMINOPHEN 650 MG RE SUPP
650.0000 mg | Freq: Once | RECTAL | Status: AC
  Administered 2014-05-19: 650 mg via RECTAL

## 2014-05-19 MED ORDER — PROPOFOL 10 MG/ML IV BOLUS
INTRAVENOUS | Status: DC | PRN
  Administered 2014-05-19: 50 mg via INTRAVENOUS

## 2014-05-19 MED ORDER — LACTATED RINGERS IV SOLN
INTRAVENOUS | Status: DC | PRN
  Administered 2014-05-19 (×2): via INTRAVENOUS

## 2014-05-19 MED ORDER — HEPARIN SODIUM (PORCINE) 1000 UNIT/ML IJ SOLN
INTRAMUSCULAR | Status: DC | PRN
  Administered 2014-05-19: 3000 [IU] via INTRAVENOUS
  Administered 2014-05-19: 24000 [IU] via INTRAVENOUS

## 2014-05-19 MED ORDER — PROTAMINE SULFATE 10 MG/ML IV SOLN
INTRAVENOUS | Status: AC
  Filled 2014-05-19: qty 5

## 2014-05-19 MED ORDER — PHENYLEPHRINE HCL 10 MG/ML IJ SOLN
0.0000 ug/min | INTRAVENOUS | Status: DC
  Administered 2014-05-19: 50 ug/min via INTRAVENOUS
  Administered 2014-05-20: 20 ug/min via INTRAVENOUS
  Filled 2014-05-19 (×3): qty 2

## 2014-05-19 MED ORDER — VANCOMYCIN HCL IN DEXTROSE 1-5 GM/200ML-% IV SOLN
1000.0000 mg | Freq: Once | INTRAVENOUS | Status: AC
  Administered 2014-05-19: 1000 mg via INTRAVENOUS
  Filled 2014-05-19: qty 200

## 2014-05-19 MED ORDER — DEXMEDETOMIDINE HCL IN NACL 200 MCG/50ML IV SOLN
INTRAVENOUS | Status: AC
  Filled 2014-05-19: qty 50

## 2014-05-19 MED ORDER — SODIUM CHLORIDE 0.9 % IJ SOLN: 3.0000 mL | INTRAMUSCULAR | Status: DC | PRN

## 2014-05-19 MED ORDER — METOPROLOL TARTRATE 12.5 MG HALF TABLET
12.5000 mg | ORAL_TABLET | Freq: Two times a day (BID) | ORAL | Status: DC
  Administered 2014-05-21: 12.5 mg via ORAL
  Filled 2014-05-19 (×7): qty 1

## 2014-05-19 MED ORDER — BISACODYL 5 MG PO TBEC
10.0000 mg | DELAYED_RELEASE_TABLET | Freq: Every day | ORAL | Status: DC
  Administered 2014-05-20 – 2014-05-21 (×2): 10 mg via ORAL
  Filled 2014-05-19 (×2): qty 2

## 2014-05-19 MED ORDER — PHENYLEPHRINE 40 MCG/ML (10ML) SYRINGE FOR IV PUSH (FOR BLOOD PRESSURE SUPPORT)
PREFILLED_SYRINGE | INTRAVENOUS | Status: AC
  Filled 2014-05-19: qty 10

## 2014-05-19 MED ORDER — ASPIRIN EC 325 MG PO TBEC
325.0000 mg | DELAYED_RELEASE_TABLET | Freq: Every day | ORAL | Status: DC
  Administered 2014-05-20 – 2014-05-21 (×2): 325 mg via ORAL
  Filled 2014-05-19 (×3): qty 1

## 2014-05-19 MED ORDER — MIDAZOLAM HCL 10 MG/2ML IJ SOLN
INTRAMUSCULAR | Status: AC
  Filled 2014-05-19: qty 2

## 2014-05-19 MED ORDER — ASPIRIN 81 MG PO CHEW: 324.0000 mg | CHEWABLE_TABLET | Freq: Every day | ORAL | Status: DC

## 2014-05-19 MED ORDER — ACETAMINOPHEN 160 MG/5ML PO SOLN: 1000.0000 mg | Freq: Four times a day (QID) | ORAL | Status: DC

## 2014-05-19 MED ORDER — PROTAMINE SULFATE 10 MG/ML IV SOLN
INTRAVENOUS | Status: AC
  Filled 2014-05-19: qty 25

## 2014-05-19 MED ORDER — SODIUM CHLORIDE 0.45 % IV SOLN
INTRAVENOUS | Status: DC
  Administered 2014-05-19: 14:00:00 via INTRAVENOUS

## 2014-05-19 MED ORDER — PANTOPRAZOLE SODIUM 40 MG PO TBEC
40.0000 mg | DELAYED_RELEASE_TABLET | Freq: Every day | ORAL | Status: DC
  Administered 2014-05-21 – 2014-05-24 (×4): 40 mg via ORAL
  Filled 2014-05-19 (×4): qty 1

## 2014-05-19 MED ORDER — SODIUM CHLORIDE 0.9 % IV SOLN
INTRAVENOUS | Status: DC | PRN
  Administered 2014-05-19 (×2): via INTRAVENOUS

## 2014-05-19 MED ORDER — MIDAZOLAM HCL 2 MG/2ML IJ SOLN
INTRAMUSCULAR | Status: AC
  Administered 2014-05-19: 5 mg via INTRAVENOUS
  Administered 2014-05-19: 1 mg via INTRAVENOUS
  Administered 2014-05-19: 2 mg via INTRAVENOUS
  Administered 2014-05-19 (×2): 1 mg via INTRAVENOUS
  Filled 2014-05-19: qty 2

## 2014-05-19 MED ORDER — ROCURONIUM BROMIDE 100 MG/10ML IV SOLN
INTRAVENOUS | Status: DC | PRN
  Administered 2014-05-19: 50 mg via INTRAVENOUS
  Administered 2014-05-19: 20 mg via INTRAVENOUS

## 2014-05-19 MED ORDER — PHENYLEPHRINE HCL 10 MG/ML IJ SOLN
20.0000 mg | INTRAMUSCULAR | Status: DC | PRN
  Administered 2014-05-19: 40 ug/min via INTRAVENOUS

## 2014-05-19 MED ORDER — METOPROLOL TARTRATE 12.5 MG HALF TABLET: 12.5000 mg | ORAL_TABLET | Freq: Once | ORAL | Status: DC

## 2014-05-19 MED ORDER — MAGNESIUM SULFATE 4000MG/100ML IJ SOLN
4.0000 g | Freq: Once | INTRAMUSCULAR | Status: AC
  Administered 2014-05-19: 4 g via INTRAVENOUS
  Filled 2014-05-19: qty 100

## 2014-05-19 MED ORDER — FENTANYL CITRATE 0.05 MG/ML IJ SOLN
INTRAMUSCULAR | Status: AC
  Administered 2014-05-19: 50 ug via INTRAVENOUS
  Administered 2014-05-19: 250 ug via INTRAVENOUS
  Administered 2014-05-19 (×2): 125 ug via INTRAVENOUS
  Administered 2014-05-19: 50 ug via INTRAVENOUS
  Administered 2014-05-19: 100 ug via INTRAVENOUS
  Administered 2014-05-19: 550 ug via INTRAVENOUS
  Administered 2014-05-19: 125 ug via INTRAVENOUS
  Administered 2014-05-19: 250 ug via INTRAVENOUS
  Administered 2014-05-19: 125 ug via INTRAVENOUS
  Filled 2014-05-19: qty 2

## 2014-05-19 MED ORDER — ACETAMINOPHEN 160 MG/5ML PO SOLN: 650.0000 mg | Freq: Once | ORAL | Status: AC

## 2014-05-19 MED ORDER — POTASSIUM CHLORIDE 10 MEQ/50ML IV SOLN
10.0000 meq | INTRAVENOUS | Status: AC
  Administered 2014-05-19 (×3): 10 meq via INTRAVENOUS

## 2014-05-19 MED ORDER — SODIUM CHLORIDE 0.9 % IV SOLN
250.0000 mL | INTRAVENOUS | Status: AC
  Administered 2014-05-19: 1000 mL via INTRAVENOUS

## 2014-05-19 MED ORDER — DEXTROSE 5 % IV SOLN
1.5000 g | Freq: Two times a day (BID) | INTRAVENOUS | Status: DC
  Filled 2014-05-19 (×2): qty 1.5

## 2014-05-19 MED ORDER — ROCURONIUM BROMIDE 50 MG/5ML IV SOLN
INTRAVENOUS | Status: AC
  Filled 2014-05-19: qty 1

## 2014-05-19 MED ORDER — ALBUMIN HUMAN 5 % IV SOLN
250.0000 mL | INTRAVENOUS | Status: AC | PRN
  Administered 2014-05-19 – 2014-05-20 (×3): 250 mL via INTRAVENOUS
  Filled 2014-05-19: qty 250

## 2014-05-19 MED ORDER — SODIUM CHLORIDE 0.9 % IV SOLN: INTRAVENOUS | Status: DC

## 2014-05-19 MED ORDER — SODIUM CHLORIDE 0.9 % IV SOLN
1.0000 g/h | Freq: Once | INTRAVENOUS | Status: DC
  Filled 2014-05-19: qty 20

## 2014-05-19 MED ORDER — HEPARIN SODIUM (PORCINE) 1000 UNIT/ML IJ SOLN
INTRAMUSCULAR | Status: AC
  Filled 2014-05-19: qty 1

## 2014-05-19 MED ORDER — INSULIN REGULAR HUMAN 100 UNIT/ML IJ SOLN
INTRAMUSCULAR | Status: DC
  Filled 2014-05-19: qty 1

## 2014-05-19 MED ORDER — MIDAZOLAM HCL 2 MG/2ML IJ SOLN
2.0000 mg | INTRAMUSCULAR | Status: DC | PRN
  Filled 2014-05-19: qty 2

## 2014-05-19 MED ORDER — LACTATED RINGERS IV SOLN
INTRAVENOUS | Status: DC | PRN
  Administered 2014-05-19 (×2): via INTRAVENOUS

## 2014-05-19 MED ORDER — INSULIN REGULAR BOLUS VIA INFUSION
0.0000 [IU] | Freq: Three times a day (TID) | INTRAVENOUS | Status: DC
  Administered 2014-05-19: 1.1 [IU]/h via INTRAVENOUS
  Filled 2014-05-19: qty 10

## 2014-05-19 MED ORDER — HEPARIN SODIUM (PORCINE) 1000 UNIT/ML IJ SOLN: INTRAMUSCULAR | Status: DC | PRN

## 2014-05-19 MED ORDER — MORPHINE SULFATE 2 MG/ML IJ SOLN
2.0000 mg | INTRAMUSCULAR | Status: DC | PRN
  Administered 2014-05-19 – 2014-05-20 (×2): 2 mg via INTRAVENOUS
  Filled 2014-05-19: qty 2
  Filled 2014-05-19 (×2): qty 1

## 2014-05-19 MED ORDER — HEMOSTATIC AGENTS (NO CHARGE) OPTIME
TOPICAL | Status: DC | PRN
  Administered 2014-05-19: 1 via TOPICAL

## 2014-05-19 MED ORDER — OXYCODONE HCL 5 MG PO TABS: 5.0000 mg | ORAL_TABLET | ORAL | Status: DC | PRN

## 2014-05-19 MED ORDER — DEXMEDETOMIDINE HCL IN NACL 200 MCG/50ML IV SOLN
0.1000 ug/kg/h | INTRAVENOUS | Status: DC
  Administered 2014-05-19: 0.5 ug/kg/h via INTRAVENOUS

## 2014-05-19 MED ORDER — DOCUSATE SODIUM 100 MG PO CAPS
200.0000 mg | ORAL_CAPSULE | Freq: Every day | ORAL | Status: DC
  Administered 2014-05-20 – 2014-05-21 (×2): 200 mg via ORAL
  Filled 2014-05-19 (×4): qty 2

## 2014-05-19 MED ORDER — METOPROLOL TARTRATE 1 MG/ML IV SOLN
2.5000 mg | INTRAVENOUS | Status: DC | PRN
  Administered 2014-05-21: 5 mg via INTRAVENOUS
  Administered 2014-05-21: 2.5 mg via INTRAVENOUS
  Filled 2014-05-19 (×2): qty 5

## 2014-05-19 MED ORDER — MORPHINE SULFATE 2 MG/ML IJ SOLN
1.0000 mg | INTRAMUSCULAR | Status: AC | PRN
  Administered 2014-05-19 – 2014-05-20 (×2): 2 mg via INTRAVENOUS
  Filled 2014-05-19: qty 1

## 2014-05-19 MED ORDER — LACTATED RINGERS IV SOLN
INTRAVENOUS | Status: DC | PRN
  Administered 2014-05-19: 07:00:00 via INTRAVENOUS

## 2014-05-19 MED ORDER — LIDOCAINE HCL (CARDIAC) 20 MG/ML IV SOLN
INTRAVENOUS | Status: AC
  Filled 2014-05-19: qty 5

## 2014-05-19 MED ORDER — METOPROLOL TARTRATE 25 MG/10 ML ORAL SUSPENSION
12.5000 mg | Freq: Two times a day (BID) | ORAL | Status: DC
Start: 2014-05-19 — End: 2014-05-20
  Administered 2014-05-19: 12.5 mg
  Filled 2014-05-19 (×3): qty 5

## 2014-05-19 SURGICAL SUPPLY — 133 items
ADAPTER CARDIO PERF ANTE/RETRO (ADAPTER) IMPLANT
ADH SKN CLS APL DERMABOND .7 (GAUZE/BANDAGES/DRESSINGS) ×2
ADPR PRFSN 84XANTGRD RTRGD (ADAPTER)
APL SKNCLS STERI-STRIP NONHPOA (GAUZE/BANDAGES/DRESSINGS) ×2
APPLIER CLIP 9.375 MED OPEN (MISCELLANEOUS)
APPLIER CLIP 9.375 SM OPEN (CLIP)
APR CLP MED 9.3 20 MLT OPN (MISCELLANEOUS)
APR CLP SM 9.3 20 MLT OPN (CLIP)
ATTRACTOMAT 16X20 MAGNETIC DRP (DRAPES) ×3 IMPLANT
BAG DECANTER FOR FLEXI CONT (MISCELLANEOUS) ×4 IMPLANT
BANDAGE ELASTIC 4 VELCRO ST LF (GAUZE/BANDAGES/DRESSINGS) ×3 IMPLANT
BANDAGE ELASTIC 6 VELCRO ST LF (GAUZE/BANDAGES/DRESSINGS) ×3 IMPLANT
BANDAGE GAUZE ELAST BULKY 4 IN (GAUZE/BANDAGES/DRESSINGS) ×3 IMPLANT
BASKET HEART (ORDER IN 25'S) (MISCELLANEOUS) ×1
BASKET HEART (ORDER IN 25S) (MISCELLANEOUS) ×2 IMPLANT
BENZOIN TINCTURE PRP APPL 2/3 (GAUZE/BANDAGES/DRESSINGS) ×3 IMPLANT
BLADE STERNUM SYSTEM 6 (BLADE) ×3 IMPLANT
BLADE SURG 11 STRL SS (BLADE) ×1 IMPLANT
BLADE SURG ROTATE 9660 (MISCELLANEOUS) IMPLANT
BNDG GAUZE ELAST 4 BULKY (GAUZE/BANDAGES/DRESSINGS) ×1 IMPLANT
CANISTER SUCTION 2500CC (MISCELLANEOUS) ×3 IMPLANT
CANNULA EZ GLIDE AORTIC 21FR (CANNULA) ×6 IMPLANT
CANNULA GUNDRY RCSP 15FR (MISCELLANEOUS) IMPLANT
CANNULA VENOUS LOW PROF 34X46 (CANNULA) ×3 IMPLANT
CANNULA VESSEL 3MM BLUNT TIP (CANNULA) ×3 IMPLANT
CARDIAC SUCTION (MISCELLANEOUS) ×3 IMPLANT
CATH CPB KIT OWEN (MISCELLANEOUS) ×3 IMPLANT
CATH THORACIC 28FR (CATHETERS) IMPLANT
CATH THORACIC 28FR RT ANG (CATHETERS) IMPLANT
CATH THORACIC 36FR (CATHETERS) ×4 IMPLANT
CATH THORACIC 36FR RT ANG (CATHETERS) ×3 IMPLANT
CLIP APPLIE 9.375 MED OPEN (MISCELLANEOUS) IMPLANT
CLIP APPLIE 9.375 SM OPEN (CLIP) IMPLANT
CLIP FOGARTY SPRING 6M (CLIP) ×1 IMPLANT
CLIP RETRACTION 3.0MM CORONARY (MISCELLANEOUS) ×1 IMPLANT
CLIP TI MEDIUM 24 (CLIP) IMPLANT
CLIP TI WIDE RED SMALL 24 (CLIP) ×1 IMPLANT
CONN Y 3/8X3/8X3/8  BEN (MISCELLANEOUS)
CONN Y 3/8X3/8X3/8 BEN (MISCELLANEOUS) IMPLANT
COVER SURGICAL LIGHT HANDLE (MISCELLANEOUS) ×3 IMPLANT
CRADLE DONUT ADULT HEAD (MISCELLANEOUS) ×3 IMPLANT
DERMABOND ADVANCED (GAUZE/BANDAGES/DRESSINGS) ×1
DERMABOND ADVANCED .7 DNX12 (GAUZE/BANDAGES/DRESSINGS) IMPLANT
DRAIN CHANNEL 32F RND 10.7 FF (WOUND CARE) ×4 IMPLANT
DRAPE CARDIOVASCULAR INCISE (DRAPES) ×3
DRAPE INCISE IOBAN 66X45 STRL (DRAPES) ×3 IMPLANT
DRAPE SLUSH/WARMER DISC (DRAPES) ×3 IMPLANT
DRAPE SRG 135X102X78XABS (DRAPES) ×2 IMPLANT
DRSG COVADERM 4X14 (GAUZE/BANDAGES/DRESSINGS) ×3 IMPLANT
ELECT REM PT RETURN 9FT ADLT (ELECTROSURGICAL) ×6
ELECTRODE REM PT RTRN 9FT ADLT (ELECTROSURGICAL) ×4 IMPLANT
GLOVE BIO SURGEON STRL SZ 6 (GLOVE) ×5 IMPLANT
GLOVE BIO SURGEON STRL SZ 6.5 (GLOVE) ×5 IMPLANT
GLOVE BIO SURGEON STRL SZ7 (GLOVE) IMPLANT
GLOVE BIO SURGEON STRL SZ7.5 (GLOVE) IMPLANT
GLOVE BIOGEL PI IND STRL 6 (GLOVE) IMPLANT
GLOVE BIOGEL PI IND STRL 6.5 (GLOVE) IMPLANT
GLOVE BIOGEL PI IND STRL 7.0 (GLOVE) IMPLANT
GLOVE BIOGEL PI INDICATOR 6 (GLOVE)
GLOVE BIOGEL PI INDICATOR 6.5 (GLOVE)
GLOVE BIOGEL PI INDICATOR 7.0 (GLOVE) ×3
GLOVE EUDERMIC 7 POWDERFREE (GLOVE) IMPLANT
GLOVE ORTHO TXT STRL SZ7.5 (GLOVE) ×6 IMPLANT
GOWN STRL NON-REIN LRG LVL3 (GOWN DISPOSABLE) ×2 IMPLANT
GOWN STRL REUS W/ TWL LRG LVL3 (GOWN DISPOSABLE) ×8 IMPLANT
GOWN STRL REUS W/TWL LRG LVL3 (GOWN DISPOSABLE) ×21
HEMOSTAT POWDER SURGIFOAM 1G (HEMOSTASIS) ×9 IMPLANT
INSERT FOGARTY 61MM (MISCELLANEOUS) IMPLANT
INSERT FOGARTY XLG (MISCELLANEOUS) ×3 IMPLANT
KIT BASIN OR (CUSTOM PROCEDURE TRAY) ×3 IMPLANT
KIT ROOM TURNOVER OR (KITS) ×3 IMPLANT
KIT SUCTION CATH 14FR (SUCTIONS) ×15 IMPLANT
KIT VASOVIEW W/TROCAR VH 2000 (KITS) ×3 IMPLANT
LEAD PACING MYOCARDI (MISCELLANEOUS) ×3 IMPLANT
LINE EXTENSION DELIVERY (MISCELLANEOUS) ×1 IMPLANT
MARKER GRAFT CORONARY BYPASS (MISCELLANEOUS) ×9 IMPLANT
MATRIX HEMOSTAT SURGIFLO (HEMOSTASIS) ×1 IMPLANT
NS IRRIG 1000ML POUR BTL (IV SOLUTION) ×15 IMPLANT
PACK OPEN HEART (CUSTOM PROCEDURE TRAY) ×3 IMPLANT
PAD ARMBOARD 7.5X6 YLW CONV (MISCELLANEOUS) ×3 IMPLANT
PAD ELECT DEFIB RADIOL ZOLL (MISCELLANEOUS) ×3 IMPLANT
PENCIL BUTTON HOLSTER BLD 10FT (ELECTRODE) ×3 IMPLANT
PUNCH AORTIC ROT 4.0MM RCL 40 (MISCELLANEOUS) ×1 IMPLANT
PUNCH AORTIC ROTATE 4.0MM (MISCELLANEOUS) IMPLANT
PUNCH AORTIC ROTATE 4.5MM 8IN (MISCELLANEOUS) IMPLANT
PUNCH AORTIC ROTATE 5MM 8IN (MISCELLANEOUS) IMPLANT
SET CARDIOPLEGIA MPS 5001102 (MISCELLANEOUS) ×1 IMPLANT
SOLUTION ANTI FOG 6CC (MISCELLANEOUS) ×1 IMPLANT
SPONGE GAUZE 4X4 12PLY (GAUZE/BANDAGES/DRESSINGS) ×5 IMPLANT
SPONGE GAUZE 4X4 12PLY STER LF (GAUZE/BANDAGES/DRESSINGS) ×1 IMPLANT
SPONGE LAP 18X18 X RAY DECT (DISPOSABLE) ×5 IMPLANT
SPONGE LAP 4X18 X RAY DECT (DISPOSABLE) ×2 IMPLANT
SUT BONE WAX W31G (SUTURE) ×3 IMPLANT
SUT ETHIBOND X763 2 0 SH 1 (SUTURE) ×6 IMPLANT
SUT MNCRL AB 3-0 PS2 18 (SUTURE) ×6 IMPLANT
SUT MNCRL AB 4-0 PS2 18 (SUTURE) ×1 IMPLANT
SUT PDS AB 1 CTX 36 (SUTURE) ×6 IMPLANT
SUT PROLENE 2 0 SH DA (SUTURE) IMPLANT
SUT PROLENE 3 0 SH DA (SUTURE) ×3 IMPLANT
SUT PROLENE 3 0 SH1 36 (SUTURE) IMPLANT
SUT PROLENE 4 0 RB 1 (SUTURE) ×3
SUT PROLENE 4 0 SH DA (SUTURE) IMPLANT
SUT PROLENE 4-0 RB1 .5 CRCL 36 (SUTURE) IMPLANT
SUT PROLENE 5 0 C 1 36 (SUTURE) IMPLANT
SUT PROLENE 6 0 C 1 30 (SUTURE) IMPLANT
SUT PROLENE 7.0 RB 3 (SUTURE) ×14 IMPLANT
SUT PROLENE 8 0 BV175 6 (SUTURE) ×5 IMPLANT
SUT PROLENE BLUE 7 0 (SUTURE) ×4 IMPLANT
SUT PROLENE POLY MONO (SUTURE) IMPLANT
SUT SILK  1 MH (SUTURE) ×2
SUT SILK 1 MH (SUTURE) ×2 IMPLANT
SUT SILK 2 0 SH CR/8 (SUTURE) ×1 IMPLANT
SUT STEEL 6MS V (SUTURE) IMPLANT
SUT STEEL STERNAL CCS#1 18IN (SUTURE) IMPLANT
SUT STEEL SZ 6 DBL 3X14 BALL (SUTURE) IMPLANT
SUT VIC AB 1 CTX 36 (SUTURE)
SUT VIC AB 1 CTX36XBRD ANBCTR (SUTURE) IMPLANT
SUT VIC AB 2-0 CT1 27 (SUTURE) ×3
SUT VIC AB 2-0 CT1 TAPERPNT 27 (SUTURE) IMPLANT
SUT VIC AB 2-0 CTX 27 (SUTURE) IMPLANT
SUT VIC AB 3-0 SH 27 (SUTURE)
SUT VIC AB 3-0 SH 27X BRD (SUTURE) IMPLANT
SUT VIC AB 3-0 X1 27 (SUTURE) IMPLANT
SUT VICRYL 4-0 PS2 18IN ABS (SUTURE) IMPLANT
SUTURE E-PAK OPEN HEART (SUTURE) ×3 IMPLANT
SYSTEM SAHARA CHEST DRAIN ATS (WOUND CARE) ×3 IMPLANT
TAPE CLOTH SURG 4X10 WHT LF (GAUZE/BANDAGES/DRESSINGS) ×2 IMPLANT
TOWEL OR 17X24 6PK STRL BLUE (TOWEL DISPOSABLE) ×6 IMPLANT
TOWEL OR 17X26 10 PK STRL BLUE (TOWEL DISPOSABLE) ×6 IMPLANT
TRAY FOLEY IC TEMP SENS 16FR (CATHETERS) ×3 IMPLANT
TUBING INSUFFLATION 10FT LAP (TUBING) ×3 IMPLANT
UNDERPAD 30X30 INCONTINENT (UNDERPADS AND DIAPERS) ×3 IMPLANT
WATER STERILE IRR 1000ML POUR (IV SOLUTION) ×6 IMPLANT

## 2014-05-19 NOTE — Op Note (Signed)
CARDIOTHORACIC SURGERY OPERATIVE NOTE  Date of Procedure: 05/19/2014  Preoperative Diagnosis: Severe 3-vessel Coronary Artery Disease  Postoperative Diagnosis: Same  Procedure:    Coronary Artery Bypass Grafting x 3   Left Internal Mammary Artery to Distal Left Anterior Descending Coronary Artery  Saphenous Vein Graft to Posterior Descending Coronary Artery  Saphenous Vein Graft to Obtuse Marginal Branch of Left Circumflex Coronary Artery  Sapheonous Vein Graft to Diagonal Branch Coronary Artery  Endoscopic Vein Harvest from Right Thigh and Lower Leg  Surgeon: Valentina Gu. [name], MD  Assistant: Ellwood Handler, PA-C  Anesthesia: Arabella Merles, MD  Operative Findings: Normal LV function  Good quality LIMA conduit for grafting  Good quality SVG conduit for grafting  Good quality target vessels for grafting  Diffuse coagulopathy throughout case with post op thrombocytopenia    BRIEF CLINICAL NOTE AND INDICATIONS FOR SURGERY  Patient is a 78 year old Retired white male from [place] with no previous history of coronary artery disease but risk factors notable for history of hypertension, hyperlipidemia, and remote history of tobacco abuse. The patient states that over the past 4-5 months he has developed progressive symptoms of exertional shortness of breath. He initially attributed his symptoms to allergies, but he has noted that with regular activity he now get short of breath with any sort of strenuous exertion. He recovers quickly with rest. He has not had any associated chest pain although he has had some vague tightness. He underwent a nuclear myocardial perfusion study that was moderate risk abnormal with a large area of inferior and lateral diminished uptake with partial redistribution. He was seen in consultation by Dr. [name] and subsequently underwent elective diagnostic cardiac catheterization revealing severe three-vessel coronary artery disease with preserved left  ventricular function. He has been referred for possible surgical revascularization.  The patient has been seen in consultation and counseled at length regarding the indications, risks and potential benefits of surgery.  All questions have been answered, and the patient provides full informed consent for the operation as described.     DETAILS OF THE OPERATIVE PROCEDURE  Preparation:  The patient is brought to the operating room on the above mentioned date and central monitoring was established by the anesthesia team including placement of Swan-Ganz catheter and radial arterial line. The patient is placed in the supine position on the operating table.  Intravenous antibiotics are administered. General endotracheal anesthesia is induced uneventfully. A Foley catheter is placed.  Baseline transesophageal echocardiogram was performed.  Findings were notable for normal LV size and systolic function.  The patient's chest, abdomen, both groins, and both lower extremities are prepared and draped in a sterile manner. A time out procedure is performed.   Surgical Approach and Conduit Harvest:  A median sternotomy incision was performed and the left internal mammary artery is dissected from the chest wall and prepared for bypass grafting. The left internal mammary artery is notably good quality conduit. Simultaneously, saphenous vein is obtained from the patient's right thigh and leg using endoscopic vein harvest technique. The saphenous vein is notably good quality conduit. After removal of the saphenous vein, the small surgical incisions in the lower extremity are closed with absorbable suture. Following systemic heparinization, the left internal mammary artery was transected distally noted to have excellent flow.   Extracorporeal Cardiopulmonary Bypass and Myocardial Protection:  The pericardium is opened.  There are adhesions particularly in the anterior mediastinum. The ascending aorta is normal in  appearance. The ascending aorta and the right atrium are cannulated  for cardioplegia bypass.  Adequate heparinization is verified.     The entire pre-bypass portion of the operation was notable for stable hemodynamics.  However, there was diffuse coagulapathy present prior to heparinization.  Cardiopulmonary bypass was begun and the surface of the heart is inspected. Distal target vessels are selected for coronary artery bypass grafting. A cardioplegia cannula is placed in the ascending aorta.  A temperature probe was placed in the interventricular septum.  The patient is allowed to cool passively to [facility] systemic temperature.  The aortic cross clamp is applied and cold blood cardioplegia is delivered initially in an antegrade fashion through the aortic root.   Iced saline slush is applied for topical hypothermia.  The initial cardioplegic arrest is rapid with early diastolic arrest.  Repeat doses of cardioplegia are administered intermittently throughout the entire cross clamp portion of the operation through the aortic root and through subsequently placed vein grafts in order to maintain completely flat electrocardiogram and septal myocardial temperature below 15C.  Myocardial protection was felt to be adequate.    Coronary Artery Bypass Grafting:   The posterior descending branch of the right coronary artery was grafted using a reversed saphenous vein graft in an end-to-side fashion.  At the site of distal anastomosis the target vessel was good quality and measured approximately 2.0 mm in diameter.  The obtuse marginal branch of the left circumflex coronary artery was grafted using a reversed saphenous vein graft in an end-to-side fashion.  At the site of distal anastomosis the target vessel was good quality and measured approximately 1.5 mm in diameter.  The diagonal branch of the left anterior descending coronary artery was grafted using a reversed saphenous vein graft in an end-to-side fashion.   At the site of distal anastomosis the target vessel was good quality and measured approximately 2.0 mm in diameter.  The distal left anterior coronary artery was grafted with the left internal mammary artery in an end-to-side fashion.  At the site of distal anastomosis the target vessel was fair quality and measured approximately 1.5 mm in diameter.  It was diffusely diseased proximally.  All proximal vein graft anastomoses were placed directly to the ascending aorta prior to removal of the aortic cross clamp.  The septal myocardial temperature rose rapidly after reperfusion of the left internal mammary artery graft.  The aortic cross clamp was removed after a total cross clamp time of 86 minutes.   Procedure Completion:  All proximal and distal coronary anastomoses were inspected for hemostasis and appropriate graft orientation. Epicardial pacing wires are fixed to the right ventricular outflow tract and to the right atrial appendage. The patient is rewarmed to 37C temperature. The patient is weaned and disconnected from cardiopulmonary bypass.  The patient's rhythm at separation from bypass was sinus.  The patient was weaned from cardioplegic bypass without any inotropic support. Total cardiopulmonary bypass time for the operation was 105 minutes.  Followup transesophageal echocardiogram performed after separation from bypass revealed no changes from the preoperative exam.  The aortic and venous cannula were removed uneventfully. Protamine was administered to reverse the anticoagulation. There was severe coagulopathy.  The patient received a total of 2 packs adult platelets and 2 units fresh frozen plasma due to coagulopathy and thrombocytopenia after separation from cardiopulmonary bypass and reversal of heparin with protamine. The mediastinum and pleural space were inspected for hemostasis and irrigated with saline solution. An extended period of time (>90 minutes) was made waiting to ensure  adequate hemostasis.  The mediastinum and  the left pleural space were drained using 4 chest tubes placed through separate stab incisions inferiorly.  The soft tissues anterior to the aorta were reapproximated loosely. The sternum is closed with double strength sternal wire. The soft tissues anterior to the sternum were closed in multiple layers and the skin is closed with a running subcuticular skin closure.  The post-bypass portion of the operation was notable for stable rhythm and hemodynamics.   Disposition:  The patient tolerated the procedure well and is transported to the surgical intensive care in stable condition. There are no intraoperative complications. All sponge instrument and needle counts are verified correct at completion of the operation.    Valentina Gu. [name] MD 05/19/2014 1:47 PM

## 2014-05-19 NOTE — Transfer of Care (Signed)
Immediate Anesthesia Transfer of Care Note  Patient: Timothy Gallagher  Procedure(s) Performed: Procedure(s) with comments: CORONARY ARTERY BYPASS GRAFTING (CABG) (N/A) - Times 4 using left internal mammary artery and endoscopically harvested right saphenous vein INTRAOPERATIVE TRANSESOPHAGEAL ECHOCARDIOGRAM (N/A)  Patient Location: ICU  Anesthesia Type:General  Level of Consciousness: sedated, unresponsive and Patient remains intubated per anesthesia plan  Airway & Oxygen Therapy: Patient remains intubated per anesthesia plan and Patient placed on Ventilator (see vital sign flow sheet for setting)  Post-op Assessment: Report given to PACU RN and Post -op Vital signs reviewed and stable  Post vital signs: Reviewed and stable  Complications: No apparent anesthesia complications

## 2014-05-19 NOTE — Interval H&P Note (Signed)
History and Physical Interval Note:  05/19/2014 7:25 AM  Timothy Gallagher  has presented today for surgery, with the diagnosis of CAD  The various methods of treatment have been discussed with the patient and family. After consideration of risks, benefits and other options for treatment, the patient has consented to  Procedure(s): CORONARY ARTERY BYPASS GRAFTING (CABG) (N/A) INTRAOPERATIVE TRANSESOPHAGEAL ECHOCARDIOGRAM (N/A) as a surgical intervention .  The patient's history has been reviewed, patient examined, no change in status, stable for surgery.  I have reviewed the patient's chart and labs.  Questions were answered to the patient's satisfaction.     [name],[name]

## 2014-05-19 NOTE — Brief Op Note (Addendum)
05/19/2014  10:50 AM  PATIENT:  Timothy Gallagher  78 y.o. male  PRE-OPERATIVE DIAGNOSIS:  CAD  POST-OPERATIVE DIAGNOSIS:  CAD  PROCEDURE:  Procedure(s) with comments:  CORONARY ARTERY BYPASS GRAFTING x 4 -LIMA to DISTAL LAD -SVG to DIAGONAL -SVG to OM 1 -SVG to PDA  SURGEON:    Rexene Alberts, MD  ASSISTANTS:  Ellwood Handler, PA-C  ANESTHESIA:   Arabella Merles, MD  CROSSCLAMP TIME:   58'  CARDIOPULMONARY BYPASS TIME: 105'  FINDINGS:  Normal LV function  Good quality LIMA conduit for grafting  Good quality SVG conduit for grafting  Good quality target vessels for grafting  Diffuse coagulopathy throughout case with post op thrombocytopenia  COMPLICATIONS: None  BASELINE WEIGHT: 88.4 kg  PATIENT DISPOSITION:   TO SICU IN STABLE CONDITION  [name],[name] 05/19/2014 1:44 PM

## 2014-05-19 NOTE — OR Nursing (Signed)
SICU first call 1146

## 2014-05-19 NOTE — Progress Notes (Signed)
  Echocardiogram Echocardiogram Transesophageal has been performed.  Mauricio Po 05/19/2014, 8:51 AM

## 2014-05-19 NOTE — Anesthesia Preprocedure Evaluation (Addendum)
Anesthesia Evaluation  Patient identified by MRN, date of birth, ID band Patient awake    Reviewed: Allergy & Precautions, H&P , NPO status , Patient's Chart, lab work & pertinent test results, reviewed documented beta blocker date and time   Airway Mallampati: II TM Distance: >3 FB Neck ROM: full    Dental  (+) Edentulous Upper, Edentulous Lower, Dental Advisory Given   Pulmonary shortness of breath and with exertion, former smoker,  breath sounds clear to auscultation        Cardiovascular hypertension, On Medications, Pt. on medications and Pt. on home beta blockers + angina with exertion + CAD Rhythm:Regular Rate:Normal     Neuro/Psych  Neuromuscular disease    GI/Hepatic IBS   Endo/Other    Renal/GU      Musculoskeletal   Abdominal   Peds  Hematology   Anesthesia Other Findings   Reproductive/Obstetrics                         Anesthesia Physical Anesthesia Plan  ASA: IV  Anesthesia Plan: General   Post-op Pain Management:    Induction: Intravenous  Airway Management Planned: Oral ETT  Additional Equipment: Arterial line, CVP, PA Cath, TEE and Ultrasound Guidance Line Placement  Intra-op Plan:   Post-operative Plan: Post-operative intubation/ventilation  Informed Consent: I have reviewed the patients History and Physical, chart, labs and discussed the procedure including the risks, benefits and alternatives for the proposed anesthesia with the patient or authorized representative who has indicated his/her understanding and acceptance.   Dental advisory given  Plan Discussed with: CRNA, Anesthesiologist and Surgeon  Anesthesia Plan Comments:        Anesthesia Quick Evaluation

## 2014-05-19 NOTE — Procedures (Signed)
Extubation Procedure Note  Patient Details:   Name: CHRLES SELLEY DOB: 1936/08/09 MRN: [id number]   Airway Documentation:     Evaluation  O2 sats: stable throughout Complications: No apparent complications Patient did tolerate procedure well. Bilateral Breath Sounds: Clear;Diminished   Yes  Pt. Extubated per rapid wean protocol. NIF 32 cmH2O and VC 1.3 L/min. IS performed 1L x 5. Vitals are stable. Pt is now on 4L [place]. RT will continue to monitor.  Stevenson Clinch 05/19/2014, 6:41 PM

## 2014-05-19 NOTE — Anesthesia Postprocedure Evaluation (Signed)
  Anesthesia Post-op Note  Patient: Timothy Gallagher  Procedure(s) Performed: Procedure(s) with comments: CORONARY ARTERY BYPASS GRAFTING (CABG) (N/A) - Times 4 using left internal mammary artery and endoscopically harvested right saphenous vein INTRAOPERATIVE TRANSESOPHAGEAL ECHOCARDIOGRAM (N/A)  Patient Location: ICU  Anesthesia Type:General  Level of Consciousness: sedated, unresponsive and Patient remains intubated per anesthesia plan  Airway and Oxygen Therapy: Patient remains intubated per anesthesia plan  Post-op Pain: sedated, not evaluated  Post-op Assessment: Post-op Vital signs reviewed, Patient's Cardiovascular Status Stable and Respiratory Function Stable  Post-op Vital Signs: Reviewed and stable  Last Vitals:  Filed Vitals:   05/19/14 1422  BP:   Pulse:   Temp:   Resp: 12    Complications: No apparent anesthesia complications

## 2014-05-19 NOTE — H&P (Signed)
[address]       [place] [address]             [phone]          CARDIOTHORACIC SURGERY HISTORY AND PHYSICAL EXAM  Referring Provider is Sinclair Grooms, MD PCP is Georgetta Haber, MD    Chief Complaint   Patient presents with   .  Coronary Artery Disease       CAD, cath 6/26, eval for CABG     HPI:  Patient is a 78 year old Retired white male from [place] with no previous history of coronary artery disease but risk factors notable for history of hypertension, hyperlipidemia, and remote history of tobacco abuse.  The patient states that over the past 4-5 months he has developed progressive symptoms of exertional shortness of breath. He initially attributed his symptoms to allergies, but he has noted that with regular activity he now get short of breath with any sort of strenuous exertion. He recovers quickly with rest. He has not had any associated chest pain although he has had some vague tightness.  He underwent a nuclear myocardial perfusion study that was moderate risk abnormal with a large area of inferior and lateral diminished uptake with partial redistribution.  He was seen in consultation by Dr. [name] and subsequently underwent elective diagnostic cardiac catheterization revealing severe three-vessel coronary artery disease with preserved left ventricular function. He has been referred for possible surgical revascularization.  The patient is married and lives with his wife locally and [place]. He has been retired from [employer] for 16 years.  He remains relatively active physically although he is somewhat limited because of peripheral neuropathy with chronic numbness involving both lower extremities related to multiple back surgeries in the distant past.  Over the past 6 months she has developed significant progression of symptoms of exertional shortness of breath with decreased energy and generalized weakness. He has had numerous  dizzy spells without syncope. He denies any resting shortness of breath, PND, orthopnea, or lower extremity edema.    Past Medical History   Diagnosis  Date   .  ACNE ROSACEA  06/27/2009   .  ACTINIC KERATOSIS, HEAD  04/18/2009   .  Acute maxillary sinusitis  05/14/2010   .  ALLERGIC RHINITIS  04/09/2007   .  B12 DEFICIENCY  06/07/2007   .  BACK PAIN WITH RADICULOPATHY  04/24/2008   .  CHEST WALL PAIN, ACUTE  06/15/2009   .  Chronic maxillary sinusitis  05/29/2008   .  COLITIS  04/27/2009   .  COLONIC POLYPS, HX OF  04/27/2009       tubular adenomas   .  DERMATITIS, ATOPIC  10/12/2007   .  DIVERTICULOSIS, COLON  04/27/2009   .  ECCHYMOSES, SPONTANEOUS  06/27/2008   .  Elevated sedimentation rate  05/02/2009   .  ESOPHAGEAL STRICTURE  04/27/2009   .  GASTRITIS, CHRONIC  04/27/2009   .  HYPERLIPIDEMIA  03/13/2008   .  HYPERTENSION  04/09/2007   .  Irritable bowel syndrome  08/11/2007   .  KIDNEY DISEASE  04/09/2007   .  NECK PAIN  09/14/2008   .  NEUROPATHY, IDIOPATHIC PERIPHERAL NEC  08/11/2007   .  OSTEOARTHRITIS, WRIST, RIGHT  08/05/2010   .  Cancer         skin   .  Coronary artery disease  05/12/2014       Cath 05/12/2014  w/ severe 3-vessel CAD and preserved LV function, EF 55%       Past Surgical History   Procedure  Laterality  Date   .  Hernia repair       .  Lamenectomy       .  Lumbar fusion       .  Tonsillectomy       .  Incision and drainage wound with foreign body removal  Left  12/20/2013       Procedure: INCISION AND DRAINAGE LEFT INDEX FINGER;  Surgeon: Tennis Must, MD;  Location: [facility];  Service: Orthopedics;  Laterality: Left;   .  Esophagogastroduodenoscopy       .  Colonoscopy       .  Flexible sigmoidoscopy       .  Varicose vein surgery  Left         Family History   Problem  Relation  Age of Onset   .  Hernia  Mother     .  Heart disease  Father     .  COPD  Father     .  Colon cancer  Neg Hx         History      Social History   .  Marital Status:  Married         Spouse Name:  N/A       Number of Children:  4   .  Years of Education:  N/A      Occupational History   .  retired         from [employer]   .  Smoking status:  Former Smoker       Quit date:  11/17/2001   .  Smokeless tobacco:  Never Used   .  Alcohol Use:  No   .  Drug Use:  No   .  Sexual Activity:  Yes      Other Topics  Concern   .  Not on file      Social History Narrative   .  No narrative on file       Current Outpatient Prescriptions   Medication  Sig  Dispense  Refill   .  acetaminophen (TYLENOL) 500 MG tablet  Take 500 mg by mouth daily as needed (for pain).          [name] [place]  aspirin EC 81 MG tablet  Take 81 mg by mouth daily.         .  cetirizine (ZYRTEC) 10 MG tablet  Take 10 mg by mouth daily as needed for allergies.          .  cyanocobalamin (,VITAMIN B-12,) 1000 MCG/ML injection  Inject 1.13ml every 3 weeks   10 mL   3   .  diphenoxylate-atropine (LOMOTIL) 2.5-0.025 MG per tablet  Take 1 tablet by mouth 4 (four) times daily as needed.   60 tablet   4   .  fish oil-omega-3 fatty acids 1000 MG capsule  Take 1 g by mouth 2 (two) times daily.          .  fluticasone (FLONASE) 50 MCG/ACT nasal spray  Place 1 spray into both nostrils 2 (two) times daily.          .  indapamide (LOZOL) 2.5 MG tablet  Take 1 tablet (2.5 mg total) by mouth daily.   90 tablet  1   .  LIPITOR 40 MG tablet  Take 1 tablet (40 mg total) by mouth daily.   30 tablet   0   .  Multiple Vitamin (MULTIVITAMIN WITH MINERALS) TABS tablet  Take 1 tablet by mouth daily.         .  nadolol (CORGARD) 20 MG tablet  Take 20 mg by mouth 2 (two) times daily.         .  nitroGLYCERIN (NITROSTAT) 0.4 MG SL tablet  Place 1 tablet (0.4 mg total) under the tongue every 5 (five) minutes as needed for chest pain.   25 tablet   3   .  tamsulosin (FLOMAX) 0.4 MG CAPS capsule  Take 1 capsule by mouth daily.         [name] [place]  telmisartan (MICARDIS) 80 MG tablet  Take 1 tablet (80 mg total) by  mouth daily.   90 tablet   3   .  temazepam (RESTORIL) 15 MG capsule  Take 15 mg by mouth at bedtime as needed for sleep.            No current facility-administered medications for this visit.       Allergies   Allergen  Reactions   .  Ciprofloxacin  Swelling   .  Lipitor [Atorvastatin]         REACTION: nausea and blurred vision   .  Mycophenolate Mofetil         REACTION: unspecified   .  Rosuvastatin         Unknown    .  Amoxicillin  Rash   .  Penicillins  Rash       Review of Systems:              General:                      normal appetite, decreased energy, no weight gain, no weight loss, no fever             Cardiac:                      + chest pain with exertion, no chest pain at rest, + SOB with exertion, no resting SOB, no PND, no orthopnea, no palpitations, no arrhythmia, no atrial fibrillation, no LE edema, + dizzy spells, no syncope             Respiratory:                + shortness of breath, NO home oxygen, NO productive cough, + dry cough, no bronchitis, no wheezing, no hemoptysis, no asthma, no pain with inspiration or cough, no sleep apnea, no CPAP at night             GI:                                no difficulty swallowing, no reflux, no frequent heartburn, no hiatal hernia, no abdominal pain, no constipation, no diarrhea, no hematochezia, no hematemesis, no melena             GU:                              no dysuria,  + frequency, no urinary tract infection, no hematuria, no enlarged prostate, + remote h/o kidney stones, +  kidney disease             Vascular:                     no pain suggestive of claudication, no pain in feet, no leg cramps, + varicose veins, no DVT, no non-healing foot ulcer             Neuro:                         no stroke, no TIA's, no seizures, no headaches, no temporary blindness one eye,  no slurred speech, + peripheral neuropathy, no chronic pain, no instability of gait, no memory/cognitive dysfunction              Musculoskeletal:         + arthritis, no joint swelling, no myalgias, mild difficulty walking, normal mobility               Skin:                            no rash, no itching, no skin infections, no pressure sores or ulcerations             Psych:                         no anxiety, no depression, no nervousness, no unusual recent stress             Eyes:                           no blurry vision, no floaters, no recent vision changes, + wears glasses or contacts             ENT:                            no hearing loss, no loose or painful teeth, + dentures             Hematologic:               no easy bruising, no abnormal bleeding, no clotting disorder, no frequent epistaxis             Endocrine:                   no diabetes, does not check CBG's at home                           Physical Exam:              BP 128/74  Pulse 67  Resp 19  Ht 6\' 1"  (1.854 m)  Wt 195 lb (88.451 kg)  BMI 25.73 kg/m2  SpO2 96%             General:                        well-appearing             HEENT:                       Unremarkable               Neck:  no JVD, no bruits, no adenopathy               Chest:                         clear to auscultation, symmetrical breath sounds, no wheezes, no rhonchi               CV:                              RRR, no murmur               Abdomen:                    soft, non-tender, no masses               Extremities:                 warm, well-perfused, pulses palpable, no LE edema, mild varicosities RLE             Rectal/GU                   Deferred             Neuro:                         Grossly non-focal and symmetrical throughout             Skin:                            Clean and dry, no rashes, no breakdown   Diagnostic Tests:  Left Heart Catheterization with Coronary Angiography Report   OTHMAN MASUR   78 y.o.   male   1936/06/07   Procedure Date: 05/12/2014   Referring Physician: Benay Pillow, M.D.     Primary Cardiologist:H. Raye Sorrow, III, M.D   INDICATIONS: Moderate risk myocardial perfusion study and angina pectoris, class III.   PROCEDURE: 1. Left heart catheterization; 2. Coronary angiography; 3. Left ventricular  CONSENT:   The risks, benefits, and details of the procedure were explained in detail to the patient. Risks including death, stroke, heart attack, kidney injury, allergy, limb ischemia, bleeding and radiation injury were discussed. The patient verbalized understanding and wanted to proceed. Informed written consent was obtained.   PROCEDURE TECHNIQUE: After Xylocaine anesthesia a 5 French sheath was placed in the right radial artery with an angiocath and the modified Seldinger technique. Coronary angiography was done using a 5 F JR 4 and JL 3.5 diagnostic catheters. Left ventriculography was done using the JR 4 catheter and hand injection.   Digital images reviewed. Significant double vessel coronary disease including diffuse right coronary and proximal LAD diagonal disease.   CONTRAST: Total of 100 cc.   COMPLICATIONS: None   HEMODYNAMICS: Aortic pressure 130/70 mmHg; LV pressure 133/6 mmHg; LVEDP 14 mm mercury   ANGIOGRAPHIC DATA: The left main coronary artery is normal. The distal left main contains a "stump" that likely represents a small twin LAD origin. The functional LAD is supplied by a large first diagonal that is described as LAD below.   The left anterior descending artery is ostial 80%. Distal LAD contains a 70-80% stenosis before the distal/apical segment. The diagonal contains segmental 90% stenosis proximal to the bifurcation into 2 large branches.  The left circumflex artery is severely diseased with mid to distal 70% stenosis before a large branch and obtuse marginal.   The right coronary artery is dominant and there is severe multifocal 90% segmental disease. A large PDA and two left ventricular branches are widely patent. Minimal septal perforator collaterals are  noted to the LAD.   LEFT VENTRICULOGRAM: Left ventricular angiogram was done in the 30 RAO projection and revealed normal EF of 55%.   IMPRESSIONS: 1. Severe three-vessel CAD involving the RCA, LAD diagonal system, and distal circumflex  2. Preserved left ventricular systolic function with an EF of 55%   RECOMMENDATION: Discharge today. Add statin therapy and sublingual nitroglycerin.   [facility] outpatient consult within the next 5-7 days to plan coronary bypass grafting.     Impression:  Patient has severe three-vessel coronary artery disease with preserved left ventricular systolic function and presents with progressive symptoms of exertional shortness of breath and chest tightness consistent with angina pectoris, CCS functional class II-III.  I have personally reviewed the patient's cardiac catheterization films and agree that he would best be treated with surgical revascularization.   Plan:  I have reviewed the indications, risks, and potential benefits of coronary artery bypass grafting with the patient and his wife.  Alternative treatment strategies have been discussed.  The patient understands and accepts all potential associated risks of surgery including but not limited to risk of death, stroke or other neurologic complication, myocardial infarction, congestive heart failure, respiratory failure, renal failure, bleeding requiring blood transfusion and/or reexploration, aortic dissection or other major vascular complication, arrhythmia, heart block or bradycardia requiring permanent pacemaker, pneumonia, pleural effusion, wound infection, pulmonary embolus or other thromboembolic complication, chronic pain or other delayed complications related to median sternotomy, or the late recurrence of symptomatic ischemic heart disease and/or congestive heart failure.  The importance of long term risk modification have been emphasized.  All questions answered.  We plan to proceed with surgery on Friday,  05/19/2014.     I spent in excess of 90 minutes during the conduct of this office consultation and >50% of this time involved direct face-to-face encounter with the patient for counseling and/or coordination of their care.    Valentina Gu. [name], MD 05/16/2014 1:51 PM

## 2014-05-19 NOTE — Progress Notes (Signed)
Utilization Review Completed.Donne Anon T7/01/2014

## 2014-05-19 NOTE — Progress Notes (Signed)
[facility] BRIEF SICU PROGRESS NOTE  Day of Surgery  S/P Procedure(s) (LRB): CORONARY ARTERY BYPASS GRAFTING (CABG) (N/A) INTRAOPERATIVE TRANSESOPHAGEAL ECHOCARDIOGRAM (N/A)   Starting to wake up on vent NSR w/ stable hemodynamics Chest tube output low Hgb 7.7 and platelet count 61k  Plan: Will transfuse 2 u PRBC's and 1 pack platelets.  Otherwise routine early postop.  Cambrie Sonnenfeld H 05/19/2014 3:41 PM

## 2014-05-19 NOTE — Progress Notes (Signed)
05/19/14 1545  Vitals  Temp ! 95.7 F (35.4 C)  ECG Heart Rate 79  Resp 12  Art Line  Arterial Line BP 96/60 mmHg  Invasive Hemodynamic Monitoring  CO (L/min) 3.2 L/min  CI (L/min/m2) 1.5 L/min/m2  CO/CI below parameters, patient currently getting PRBC x2 and PLT x1. New @ 30 mcg/min. Continue to reassess after volume infused.

## 2014-05-20 ENCOUNTER — Inpatient Hospital Stay (HOSPITAL_COMMUNITY): Payer: Medicare Other

## 2014-05-20 DIAGNOSIS — F05 Delirium due to known physiological condition: Secondary | ICD-10-CM | POA: Diagnosis not present

## 2014-05-20 HISTORY — DX: Delirium due to known physiological condition: F05

## 2014-05-20 LAB — POCT I-STAT, CHEM 8
BUN: 20 mg/dL (ref 6–23)
Calcium, Ion: 1.17 mmol/L (ref 1.13–1.30)
Chloride: 108 mEq/L (ref 96–112)
Creatinine, Ser: 1.1 mg/dL (ref 0.50–1.35)
Glucose, Bld: 118 mg/dL — ABNORMAL HIGH (ref 70–99)
HCT: 23 % — ABNORMAL LOW (ref 39.0–52.0)
Hemoglobin: 7.8 g/dL — ABNORMAL LOW (ref 13.0–17.0)
Potassium: 4.3 mEq/L (ref 3.7–5.3)
Sodium: 140 mEq/L (ref 137–147)
TCO2: 21 mmol/L (ref 0–100)

## 2014-05-20 LAB — CBC
HCT: 24.1 % — ABNORMAL LOW (ref 39.0–52.0)
HCT: 23.5 % — ABNORMAL LOW (ref 39.0–52.0)
Hemoglobin: 8.2 g/dL — ABNORMAL LOW (ref 13.0–17.0)
Hemoglobin: 8 g/dL — ABNORMAL LOW (ref 13.0–17.0)
MCH: 29.6 pg (ref 26.0–34.0)
MCH: 29.4 pg (ref 26.0–34.0)
MCHC: 34 g/dL (ref 30.0–36.0)
MCHC: 34 g/dL (ref 30.0–36.0)
MCV: 87 fL (ref 78.0–100.0)
MCV: 86.4 fL (ref 78.0–100.0)
Platelets: 68 10*3/uL — ABNORMAL LOW (ref 150–400)
Platelets: 73 10*3/uL — ABNORMAL LOW (ref 150–400)
RBC: 2.77 MIL/uL — ABNORMAL LOW (ref 4.22–5.81)
RBC: 2.72 MIL/uL — ABNORMAL LOW (ref 4.22–5.81)
RDW: 14.7 % (ref 11.5–15.5)
RDW: 15.2 % (ref 11.5–15.5)
WBC: 6.5 10*3/uL (ref 4.0–10.5)
WBC: 8.2 10*3/uL (ref 4.0–10.5)

## 2014-05-20 LAB — PREPARE PLATELET PHERESIS
Unit division: 0
Unit division: 0
Unit division: 0
Unit division: 0

## 2014-05-20 LAB — GLUCOSE, CAPILLARY
Glucose-Capillary: 104 mg/dL — ABNORMAL HIGH (ref 70–99)
Glucose-Capillary: 108 mg/dL — ABNORMAL HIGH (ref 70–99)
Glucose-Capillary: 108 mg/dL — ABNORMAL HIGH (ref 70–99)
Glucose-Capillary: 110 mg/dL — ABNORMAL HIGH (ref 70–99)
Glucose-Capillary: 113 mg/dL — ABNORMAL HIGH (ref 70–99)
Glucose-Capillary: 113 mg/dL — ABNORMAL HIGH (ref 70–99)
Glucose-Capillary: 135 mg/dL — ABNORMAL HIGH (ref 70–99)
Glucose-Capillary: 138 mg/dL — ABNORMAL HIGH (ref 70–99)
Glucose-Capillary: 153 mg/dL — ABNORMAL HIGH (ref 70–99)
Glucose-Capillary: 93 mg/dL (ref 70–99)

## 2014-05-20 LAB — POCT I-STAT 3, ART BLOOD GAS (G3+)
Acid-base deficit: 4 mmol/L — ABNORMAL HIGH (ref 0.0–2.0)
Bicarbonate: 22 mEq/L (ref 20.0–24.0)
O2 Saturation: 95 %
Patient temperature: 37.7
TCO2: 23 mmol/L (ref 0–100)
pCO2 arterial: 44.2 mmHg (ref 35.0–45.0)
pH, Arterial: 7.308 — ABNORMAL LOW (ref 7.350–7.450)
pO2, Arterial: 88 mmHg (ref 80.0–100.0)

## 2014-05-20 LAB — PREPARE FRESH FROZEN PLASMA
Unit division: 0
Unit division: 0

## 2014-05-20 LAB — BASIC METABOLIC PANEL
Anion gap: 13 (ref 5–15)
BUN: 18 mg/dL (ref 6–23)
CO2: 22 mEq/L (ref 19–32)
Calcium: 7.7 mg/dL — ABNORMAL LOW (ref 8.4–10.5)
Chloride: 106 mEq/L (ref 96–112)
Creatinine, Ser: 0.98 mg/dL (ref 0.50–1.35)
GFR calc Af Amer: >90 mL/min (ref >90)
GFR calc non Af Amer: 78 mL/min — ABNORMAL LOW (ref >90)
Glucose, Bld: 114 mg/dL — ABNORMAL HIGH (ref 70–99)
Potassium: 4.4 mEq/L (ref 3.7–5.3)
Sodium: 141 mEq/L (ref 137–147)

## 2014-05-20 LAB — CREATININE, SERUM
Creatinine, Ser: 1.07 mg/dL (ref 0.50–1.35)
GFR calc Af Amer: 76 mL/min — ABNORMAL LOW (ref >90)
GFR calc non Af Amer: 66 mL/min — ABNORMAL LOW (ref >90)

## 2014-05-20 LAB — MAGNESIUM
Magnesium: 2.3 mg/dL (ref 1.5–2.5)
Magnesium: 2.2 mg/dL (ref 1.5–2.5)

## 2014-05-20 MED ORDER — INSULIN ASPART 100 UNIT/ML ~~LOC~~ SOLN
0.0000 [IU] | SUBCUTANEOUS | Status: DC
  Administered 2014-05-20 – 2014-05-21 (×3): 2 [IU] via SUBCUTANEOUS

## 2014-05-20 MED ORDER — HALOPERIDOL LACTATE 5 MG/ML IJ SOLN
INTRAMUSCULAR | Status: AC
  Filled 2014-05-20: qty 1

## 2014-05-20 MED ORDER — MORPHINE SULFATE 2 MG/ML IJ SOLN
2.0000 mg | INTRAMUSCULAR | Status: DC | PRN
  Filled 2014-05-20: qty 1

## 2014-05-20 MED ORDER — HALOPERIDOL LACTATE 5 MG/ML IJ SOLN
4.0000 mg | Freq: Once | INTRAMUSCULAR | Status: AC
  Administered 2014-05-20: 4 mg via INTRAVENOUS

## 2014-05-20 MED ORDER — INSULIN ASPART 100 UNIT/ML ~~LOC~~ SOLN: 0.0000 [IU] | SUBCUTANEOUS | Status: DC

## 2014-05-20 MED ORDER — MUPIROCIN 2 % EX OINT
1.0000 "application " | TOPICAL_OINTMENT | Freq: Two times a day (BID) | CUTANEOUS | Status: AC
  Administered 2014-05-20 – 2014-05-22 (×5): 1 via NASAL

## 2014-05-20 NOTE — Progress Notes (Addendum)
[address]       [place] [address]             [phone]        CARDIOTHORACIC SURGERY PROGRESS NOTE   R1 Day Post-Op Procedure(s) (LRB): CORONARY ARTERY BYPASS GRAFTING (CABG) (N/A) INTRAOPERATIVE TRANSESOPHAGEAL ECHOCARDIOGRAM (N/A)  Subjective: Looks good.  Mild soreness in chest.  Denies SOB.  Feels thirsty.  Objective: Vital signs: BP Readings from Last 1 Encounters:  05/20/14 115/69   Pulse Readings from Last 1 Encounters:  05/20/14 90   Resp Readings from Last 1 Encounters:  05/20/14 19   Temp Readings from Last 1 Encounters:  05/20/14 98 F (36.7 C) Oral    Hemodynamics: PAP: (21-42)/(12-26) 32/14 mmHg CO:  [3.2 L/min-6.2 L/min] 5.6 L/min CI:  [1.5 L/min/m2-2.9 L/min/m2] 2.6 L/min/m2  Physical Exam:  Rhythm:   Sinus - AAI paced  Breath sounds: clear  Heart sounds:  RRR  Incisions:  Dressing dry, intact  Abdomen:  Soft, non-distended, non-tender  Extremities:  Warm, well-perfused    Intake/Output from previous day: 07/03 0701 - 07/04 0700 In: 12519.7 [P.O.:300; I.V.:6912; Blood:3257.7; IV [id number]:9622] Out: 2979 [Urine:3430; Blood:800; Chest Tube:720] Intake/Output this shift:    Lab Results:  CBC: Recent Labs  05/19/14 2016 05/20/14 0400  WBC 5.7 6.5  HGB 8.9* 8.2*  HCT 25.9* 24.1*  PLT 102* 68*    BMET:  Recent Labs  05/17/14 1215  05/19/14 2012 05/19/14 2016 05/20/14 0400  NA 139  < > 143  --  141  K 4.2  < > 4.9  --  4.4  CL 102  --  107  --  106  CO2 18*  --   --   --  22  GLUCOSE 115*  < > 105*  --  114*  BUN 19  --  18  --  18  CREATININE 0.85  --  0.90 0.86 0.98  CALCIUM 9.4  --   --   --  7.7*  < > = values in this interval not displayed.   CBG (last 3)   Recent Labs  05/20/14 0241 05/20/14 0405 05/20/14 0737  GLUCAP 104* 113* 108*    ABG    Component Value Date/Time   PHART 7.308* 05/20/2014 0411   PCO2ART 44.2 05/20/2014 0411   PO2ART 88.0 05/20/2014 0411   HCO3 22.0 05/20/2014  0411   TCO2 23 05/20/2014 0411   ACIDBASEDEF 4.0* 05/20/2014 0411   O2SAT 95.0 05/20/2014 0411   EKG: NSR w/out acute ischemic changes   CXR: PORTABLE CHEST - 1 VIEW  COMPARISON: One-view chest 05/19/2014.  FINDINGS:  The heart is mildly enlarged. The patient is status post median  sternotomy for CABG. The patient has been extubated. The mediastinal  drain and left-sided chest tubes are stable. There is no  pneumothorax. The tip of the Swan-Ganz catheter is in the main  pulmonary outflow tract. Moderate pulmonary vascular congestion is  present without frank edema. A left pleural effusion and is slightly  increased. Left lower lobe airspace disease has slightly increased,  likely representing atelectasis.  IMPRESSION:  1. Interval extubation.  2. The support apparatus is otherwise stable.  3. Stable left-sided chest tubes without pneumothorax.  4. Slight increase in left pleural effusion and basilar airspace  disease, likely represented atelectasis.  5. Moderate pulmonary vascular congestion without frank edema.  Electronically Signed  By: Lawrence Santiago M.D.  On: 05/20/2014 07:16   Assessment/Plan: S/P Procedure(s) (LRB):  CORONARY ARTERY BYPASS GRAFTING (CABG) (N/A) INTRAOPERATIVE TRANSESOPHAGEAL ECHOCARDIOGRAM (N/A)  Overall doing well POD1 Maintaining NSR w/ stable BP on low-dose Neo drip Expected post op acute blood loss anemia, stable Expected post op volume excess, mild Expected post op atelectasis, mild, L>R   Mobilize  D/C tubes and lines  Hold diuretics until BP stable off Neo drip  Routine early postop   Timothy Gallagher 05/20/2014 9:38 AM

## 2014-05-20 NOTE — Progress Notes (Signed)
[facility] BRIEF SICU PROGRESS NOTE  1 Day Post-Op  S/P Procedure(s) (LRB): CORONARY ARTERY BYPASS GRAFTING (CABG) (N/A) INTRAOPERATIVE TRANSESOPHAGEAL ECHOCARDIOGRAM (N/A)   Stable day.  Some intermittent nausea NSR - AAI paced w/ stable BP off drips O2 sats 94% on room air UOP adequate  Plan: Continue routine care  [name],[name] 05/20/2014 5:18 PM

## 2014-05-21 ENCOUNTER — Inpatient Hospital Stay (HOSPITAL_COMMUNITY): Payer: Medicare Other

## 2014-05-21 LAB — BASIC METABOLIC PANEL
Anion gap: 13 (ref 5–15)
BUN: 25 mg/dL — ABNORMAL HIGH (ref 6–23)
CO2: 23 mEq/L (ref 19–32)
Calcium: 8.1 mg/dL — ABNORMAL LOW (ref 8.4–10.5)
Chloride: 103 mEq/L (ref 96–112)
Creatinine, Ser: 1.17 mg/dL (ref 0.50–1.35)
GFR calc Af Amer: 69 mL/min — ABNORMAL LOW (ref >90)
GFR calc non Af Amer: 59 mL/min — ABNORMAL LOW (ref >90)
Glucose, Bld: 132 mg/dL — ABNORMAL HIGH (ref 70–99)
Potassium: 4.5 mEq/L (ref 3.7–5.3)
Sodium: 139 mEq/L (ref 137–147)

## 2014-05-21 LAB — GLUCOSE, CAPILLARY
Glucose-Capillary: 141 mg/dL — ABNORMAL HIGH (ref 70–99)
Glucose-Capillary: 104 mg/dL — ABNORMAL HIGH (ref 70–99)

## 2014-05-21 LAB — CBC
HCT: 23.5 % — ABNORMAL LOW (ref 39.0–52.0)
Hemoglobin: 8.1 g/dL — ABNORMAL LOW (ref 13.0–17.0)
MCH: 29.9 pg (ref 26.0–34.0)
MCHC: 34.5 g/dL (ref 30.0–36.0)
MCV: 86.7 fL (ref 78.0–100.0)
Platelets: 69 10*3/uL — ABNORMAL LOW (ref 150–400)
RBC: 2.71 MIL/uL — ABNORMAL LOW (ref 4.22–5.81)
RDW: 15.1 % (ref 11.5–15.5)
WBC: 8.9 10*3/uL (ref 4.0–10.5)

## 2014-05-21 MED ORDER — SODIUM CHLORIDE 0.9 % IJ SOLN
3.0000 mL | Freq: Two times a day (BID) | INTRAMUSCULAR | Status: DC
Start: 2014-05-21 — End: 2014-05-24
  Administered 2014-05-22 – 2014-05-23 (×2): 3 mL via INTRAVENOUS

## 2014-05-21 MED ORDER — TRAMADOL HCL 50 MG PO TABS: 50.0000 mg | ORAL_TABLET | ORAL | Status: DC | PRN

## 2014-05-21 MED ORDER — TEMAZEPAM 15 MG PO CAPS
15.0000 mg | ORAL_CAPSULE | Freq: Every evening | ORAL | Status: DC | PRN
  Administered 2014-05-21 – 2014-05-22 (×2): 15 mg via ORAL
  Filled 2014-05-21 (×2): qty 1

## 2014-05-21 MED ORDER — SODIUM CHLORIDE 0.9 % IV SOLN: 250.0000 mL | INTRAVENOUS | Status: DC | PRN

## 2014-05-21 MED ORDER — HALOPERIDOL LACTATE 5 MG/ML IJ SOLN
5.0000 mg | Freq: Four times a day (QID) | INTRAMUSCULAR | Status: DC | PRN
  Administered 2014-05-22: 5 mg via INTRAVENOUS
  Filled 2014-05-21 (×2): qty 1

## 2014-05-21 MED ORDER — AMIODARONE LOAD VIA INFUSION
150.0000 mg | Freq: Once | INTRAVENOUS | Status: AC
Start: 2014-05-21 — End: 2014-05-21
  Administered 2014-05-21: 150 mg via INTRAVENOUS
  Filled 2014-05-21: qty 83.34

## 2014-05-21 MED ORDER — FUROSEMIDE 40 MG PO TABS
40.0000 mg | ORAL_TABLET | Freq: Every day | ORAL | Status: AC
  Administered 2014-05-21 – 2014-05-22 (×2): 40 mg via ORAL
  Filled 2014-05-21 (×2): qty 1

## 2014-05-21 MED ORDER — AMIODARONE HCL IN DEXTROSE 360-4.14 MG/200ML-% IV SOLN
30.0000 mg/h | INTRAVENOUS | Status: DC
  Filled 2014-05-21 (×3): qty 200

## 2014-05-21 MED ORDER — AMIODARONE HCL IN DEXTROSE 360-4.14 MG/200ML-% IV SOLN
INTRAVENOUS | Status: AC
  Filled 2014-05-21: qty 200

## 2014-05-21 MED ORDER — MOVING RIGHT ALONG BOOK
Freq: Once | Status: AC
  Administered 2014-05-21: 11:00:00
  Filled 2014-05-21: qty 1

## 2014-05-21 MED ORDER — POTASSIUM CHLORIDE CRYS ER 20 MEQ PO TBCR
20.0000 meq | EXTENDED_RELEASE_TABLET | Freq: Every day | ORAL | Status: DC
Start: 2014-05-21 — End: 2014-05-23
  Administered 2014-05-21 – 2014-05-22 (×2): 20 meq via ORAL
  Filled 2014-05-21 (×3): qty 1

## 2014-05-21 MED ORDER — AMIODARONE HCL IN DEXTROSE 360-4.14 MG/200ML-% IV SOLN
60.0000 mg/h | INTRAVENOUS | Status: AC
  Administered 2014-05-21 (×2): 60 mg/h via INTRAVENOUS
  Administered 2014-05-22: 30 mg/h via INTRAVENOUS
  Filled 2014-05-21: qty 200

## 2014-05-21 MED ORDER — SODIUM CHLORIDE 0.9 % IJ SOLN: 3.0000 mL | INTRAMUSCULAR | Status: DC | PRN

## 2014-05-21 NOTE — Progress Notes (Signed)
[facility] BRIEF SICU PROGRESS NOTE  2 Days Post-Op  S/P Procedure(s) (LRB): CORONARY ARTERY BYPASS GRAFTING (CABG) (N/A) INTRAOPERATIVE TRANSESOPHAGEAL ECHOCARDIOGRAM (N/A)   Stable day Mildly confused but otherwise doing well Ambulated in SICU several times  Plan: Continue current plan  Shaindy Reader H 05/21/2014 5:29 PM

## 2014-05-21 NOTE — Progress Notes (Addendum)
[address]       [place] [address]             [phone]        CARDIOTHORACIC SURGERY PROGRESS NOTE   R2 Days Post-Op Procedure(s) (LRB): CORONARY ARTERY BYPASS GRAFTING (CABG) (N/A) INTRAOPERATIVE TRANSESOPHAGEAL ECHOCARDIOGRAM (N/A)  Subjective: Some agitation and confusion last night.  Didn't sleep but improved after haldol.  Alert and oriented x3 this morning.  Ambulated 2 laps around SICU and ate a full breakfast.  Denies pain.  Objective: Vital signs: BP Readings from Last 1 Encounters:  05/21/14 107/91   Pulse Readings from Last 1 Encounters:  05/21/14 90   Resp Readings from Last 1 Encounters:  05/21/14 23   Temp Readings from Last 1 Encounters:  05/21/14 98 F (36.7 C) Oral    Hemodynamics: PAP: (27)/(14) 27/14 mmHg  Physical Exam:  Rhythm:   sinus  Breath sounds: clear  Heart sounds:  RRR  Incisions:  Clean and dry  Abdomen:  Soft, non-distended, non-tender  Extremities:  Warm, well-perfused    Intake/Output from previous day: 07/04 0701 - 07/05 0700 In: 532.8 [P.O.:480; I.V.:52.8] Out: 815 [Urine:705; Chest Tube:110] Intake/Output this shift: Total I/O In: 300 [P.O.:300] Out: 210 [Urine:210]  Lab Results:  CBC: Recent Labs  05/20/14 1700 05/20/14 1729 05/21/14 0430  WBC 8.2  --  8.9  HGB 8.0* 7.8* 8.1*  HCT 23.5* 23.0* 23.5*  PLT 73*  --  69*    BMET:  Recent Labs  05/20/14 0400  05/20/14 1729 05/21/14 0430  NA 141  --  140 139  K 4.4  --  4.3 4.5  CL 106  --  108 103  CO2 22  --   --  23  GLUCOSE 114*  --  118* 132*  BUN 18  --  20 25*  CREATININE 0.98  < > 1.10 1.17  CALCIUM 7.7*  --   --  8.1*  < > = values in this interval not displayed.   CBG (last 3)   Recent Labs  05/20/14 2336 05/21/14 0354 05/21/14 0803  GLUCAP 138* 141* 104*    ABG    Component Value Date/Time   PHART 7.308* 05/20/2014 0411   PCO2ART 44.2 05/20/2014 0411   PO2ART 88.0 05/20/2014 0411   HCO3 22.0 05/20/2014  0411   TCO2 21 05/20/2014 1729   ACIDBASEDEF 4.0* 05/20/2014 0411   O2SAT 95.0 05/20/2014 0411    CXR: PORTABLE CHEST - 1 VIEW  COMPARISON: Portable chest x-ray of May 20, 2014  FINDINGS:  The Swan-Ganz catheter and the mediastinal drains and chest tubes  have been removed. The internal jugular Cordis sheath on the right  remains. The lungs are adequately inflated. There is stable density  in the retrocardiac region on the left. The cardiopericardial  silhouette is normal in size. The pulmonary vascularity is mildly  prominent centrally but has improved. There are 8 intact sternal  wires present.  IMPRESSION:  Interval removal of support tubes and lines. There is improving  pulmonary vascular congestion and persistent left lower lobe  atelectasis.  Electronically Signed  By: David Martinique  On: 05/21/2014 07:08    Assessment/Plan: S/P Procedure(s) (LRB): CORONARY ARTERY BYPASS GRAFTING (CABG) (N/A) INTRAOPERATIVE TRANSESOPHAGEAL ECHOCARDIOGRAM (N/A)  Overall stable POD2 Postop delirium, mild Expected post op acute blood loss anemia, stable Expected post op atelectasis, mild Expected post op volume excess, mild, diuresing   Mobilize  Diuresis  Pulm toilet  Minimize narcotics and sedation  Restart Restoril at bedtime which patient has been taking for years  Keep in SICU because of delirium and agitation last night  Restart Corgard, Micardis and Lozol gradually as BP increases   [name],[name] 05/21/2014 9:32 AM

## 2014-05-22 ENCOUNTER — Inpatient Hospital Stay (HOSPITAL_COMMUNITY): Payer: Medicare Other

## 2014-05-22 ENCOUNTER — Encounter (HOSPITAL_COMMUNITY): Payer: Self-pay | Admitting: Thoracic Surgery (Cardiothoracic Vascular Surgery)

## 2014-05-22 LAB — BASIC METABOLIC PANEL
Anion gap: 13 (ref 5–15)
BUN: 25 mg/dL — ABNORMAL HIGH (ref 6–23)
CO2: 23 mEq/L (ref 19–32)
Calcium: 8.3 mg/dL — ABNORMAL LOW (ref 8.4–10.5)
Chloride: 101 mEq/L (ref 96–112)
Creatinine, Ser: 0.96 mg/dL (ref 0.50–1.35)
GFR calc Af Amer: >90 mL/min (ref >90)
GFR calc non Af Amer: 79 mL/min — ABNORMAL LOW (ref >90)
Glucose, Bld: 118 mg/dL — ABNORMAL HIGH (ref 70–99)
Potassium: 5 mEq/L (ref 3.7–5.3)
Sodium: 137 mEq/L (ref 137–147)

## 2014-05-22 LAB — CBC
HCT: 25.7 % — ABNORMAL LOW (ref 39.0–52.0)
Hemoglobin: 8.7 g/dL — ABNORMAL LOW (ref 13.0–17.0)
MCH: 29.6 pg (ref 26.0–34.0)
MCHC: 33.9 g/dL (ref 30.0–36.0)
MCV: 87.4 fL (ref 78.0–100.0)
Platelets: 95 10*3/uL — ABNORMAL LOW (ref 150–400)
RBC: 2.94 MIL/uL — ABNORMAL LOW (ref 4.22–5.81)
RDW: 15.4 % (ref 11.5–15.5)
WBC: 7.2 10*3/uL (ref 4.0–10.5)

## 2014-05-22 MED ORDER — BENZONATATE 100 MG PO CAPS
200.0000 mg | ORAL_CAPSULE | Freq: Three times a day (TID) | ORAL | Status: DC | PRN
  Administered 2014-05-23: 200 mg via ORAL
  Filled 2014-05-22: qty 2

## 2014-05-22 MED ORDER — INDAPAMIDE 2.5 MG PO TABS
2.5000 mg | ORAL_TABLET | Freq: Every day | ORAL | Status: DC
  Filled 2014-05-22: qty 1

## 2014-05-22 MED ORDER — AMIODARONE HCL 200 MG PO TABS
400.0000 mg | ORAL_TABLET | Freq: Two times a day (BID) | ORAL | Status: DC
  Filled 2014-05-22 (×2): qty 2

## 2014-05-22 MED ORDER — ASPIRIN EC 325 MG PO TBEC
325.0000 mg | DELAYED_RELEASE_TABLET | Freq: Every day | ORAL | Status: DC
  Administered 2014-05-22 – 2014-05-24 (×3): 325 mg via ORAL
  Filled 2014-05-22 (×3): qty 1

## 2014-05-22 MED ORDER — ENOXAPARIN SODIUM 40 MG/0.4ML ~~LOC~~ SOLN
40.0000 mg | SUBCUTANEOUS | Status: DC
Start: 2014-05-23 — End: 2014-05-24
  Administered 2014-05-23 – 2014-05-24 (×2): 40 mg via SUBCUTANEOUS
  Filled 2014-05-22 (×3): qty 0.4

## 2014-05-22 MED ORDER — AMIODARONE HCL IN DEXTROSE 360-4.14 MG/200ML-% IV SOLN
INTRAVENOUS | Status: AC
  Administered 2014-05-22: 30 mg/h via INTRAVENOUS
  Filled 2014-05-22: qty 200

## 2014-05-22 MED ORDER — ALBUTEROL SULFATE (2.5 MG/3ML) 0.083% IN NEBU
2.5000 mg | INHALATION_SOLUTION | RESPIRATORY_TRACT | Status: DC | PRN
  Administered 2014-05-22 – 2014-05-23 (×2): 2.5 mg via RESPIRATORY_TRACT
  Filled 2014-05-22 (×2): qty 3

## 2014-05-22 MED ORDER — TRAMADOL HCL 50 MG PO TABS: 50.0000 mg | ORAL_TABLET | ORAL | Status: DC | PRN

## 2014-05-22 MED ORDER — AMIODARONE HCL 200 MG PO TABS
400.0000 mg | ORAL_TABLET | Freq: Two times a day (BID) | ORAL | Status: DC
  Administered 2014-05-22 (×2): 400 mg via ORAL
  Filled 2014-05-22 (×4): qty 2

## 2014-05-22 MED ORDER — NADOLOL 20 MG PO TABS
20.0000 mg | ORAL_TABLET | Freq: Two times a day (BID) | ORAL | Status: DC
  Administered 2014-05-22 – 2014-05-24 (×5): 20 mg via ORAL
  Filled 2014-05-22 (×6): qty 1

## 2014-05-22 MED ORDER — BENZONATATE 100 MG PO CAPS: 200.0000 mg | ORAL_CAPSULE | Freq: Three times a day (TID) | ORAL | Status: DC

## 2014-05-22 MED FILL — Lidocaine HCl IV Inj 20 MG/ML: INTRAVENOUS | Qty: 5 | Status: AC

## 2014-05-22 MED FILL — Sodium Chloride IV Soln 0.9%: INTRAVENOUS | Qty: 2000 | Status: AC

## 2014-05-22 MED FILL — Heparin Sodium (Porcine) Inj 1000 Unit/ML: INTRAMUSCULAR | Qty: 10 | Status: AC

## 2014-05-22 MED FILL — Sodium Bicarbonate IV Soln 8.4%: INTRAVENOUS | Qty: 50 | Status: AC

## 2014-05-22 MED FILL — Heparin Sodium (Porcine) Inj 1000 Unit/ML: INTRAMUSCULAR | Qty: 30 | Status: AC

## 2014-05-22 MED FILL — Magnesium Sulfate Inj 50%: INTRAMUSCULAR | Qty: 10 | Status: AC

## 2014-05-22 MED FILL — Mannitol IV Soln 20%: INTRAVENOUS | Qty: 500 | Status: AC

## 2014-05-22 MED FILL — Electrolyte-R (PH 7.4) Solution: INTRAVENOUS | Qty: 3000 | Status: AC

## 2014-05-22 MED FILL — Potassium Chloride Inj 2 mEq/ML: INTRAVENOUS | Qty: 40 | Status: AC

## 2014-05-22 NOTE — Progress Notes (Signed)
Spoke with MD [name] about scheduled PO potassium; pt's potassium 5.0 on morning labs.  MD [name] states to give med.  Med given.  Pt transferred to 2West on room air; ambulated about 400 feet and positioned sitting on bed with NT in room, VSS.  Tolerated transfer well.

## 2014-05-22 NOTE — Progress Notes (Signed)
1350 Came to walk with pt. Pt stated he walked over and he walked earlier and that he is too tired to walk again any time soon. Encouraged pt to walk once more before bedtime. Helped NT get pt comfortable in bed. Will follow up tomorrow. Graylon Good RN BSN 05/22/2014 2:07 PM

## 2014-05-22 NOTE — Progress Notes (Addendum)
[facility] DAILY ICU PROGRESS NOTE                   [address]            [place] [address]          [phone]   3 Days Post-Op Procedure(s) (LRB): CORONARY ARTERY BYPASS GRAFTING (CABG) (N/A) INTRAOPERATIVE TRANSESOPHAGEAL ECHOCARDIOGRAM (N/A)  Total Length of Stay:  LOS: 3 days   Subjective: Rested poorly last night, "I was up all night urinating."  Alert and oriented today.  C/o soreness mainly in leg.   Objective: Vital signs in last 24 hours: Temp:  [97.5 F (36.4 C)-98.3 F (36.8 C)] 98.3 F (36.8 C) (07/06 0736) Pulse Rate:  [47-119] 104 (07/06 0700) Cardiac Rhythm:  [-] Sinus bradycardia;Normal sinus rhythm (07/06 0600) Resp:  [13-33] 32 (07/06 0700) BP: (89-146)/(54-93) 132/78 mmHg (07/06 0700) SpO2:  [90 %-100 %] 91 % (07/06 0700) Weight:  [206 lb 12.7 oz (93.8 kg)] 206 lb 12.7 oz (93.8 kg) (07/06 0600)  Filed Weights   05/20/14 0600 05/21/14 0300 05/22/14 0600  Weight: 210 lb 1.6 oz (95.3 kg) 209 lb 14.1 oz (95.2 kg) 206 lb 12.7 oz (93.8 kg)  BASELINE WEIGHT: 88.4 kg   Weight change: -3 lb 1.4 oz (-1.4 kg)   Hemodynamic parameters for last 24 hours:    Intake/Output from previous day: 07/05 0701 - 07/06 0700 In: 1597.7 [P.O.:1220; I.V.:377.7] Out: 1280 [Urine:1280]  Intake/Output this shift:    Current Meds: Scheduled Meds: . acetaminophen  1,000 mg Oral 4 times per day  . aspirin EC  325 mg Oral Daily  . bisacodyl  10 mg Oral Daily   Or  . bisacodyl  10 mg Rectal Daily  . docusate sodium  200 mg Oral Daily  . furosemide  40 mg Oral Daily  . metoprolol tartrate  12.5 mg Oral BID  . mupirocin ointment  1 application Nasal BID  . pantoprazole  40 mg Oral Daily  . potassium chloride  20 mEq Oral Daily  . sodium chloride  3 mL Intravenous Q12H  . sodium chloride  3 mL Intravenous Q12H   Continuous Infusions: . amiodarone 30 mg/hr (05/22/14 0700)   PRN Meds:.sodium chloride, haloperidol lactate, metoprolol, ondansetron  (ZOFRAN) IV, sodium chloride, sodium chloride, temazepam, traMADol   Physical Exam: General appearance: alert, cooperative and no distress Heart: regular rate and rhythm Lungs: clear to auscultation bilaterally Extremities: Mild RLE edema Wound: Dressed and dry    Lab Results: CBC: Recent Labs  05/21/14 0430 05/22/14 0635  WBC 8.9 7.2  HGB 8.1* 8.7*  HCT 23.5* 25.7*  PLT 69* 95*   BMET:  Recent Labs  05/21/14 0430 05/22/14 0635  NA 139 137  K 4.5 5.0  CL 103 101  CO2 23 23  GLUCOSE 132* 118*  BUN 25* 25*  CREATININE 1.17 0.96  CALCIUM 8.1* 8.3*    PT/INR:  Recent Labs  05/19/14 1425  LABPROT 17.7*  INR 1.46   Radiology: Dg Chest Port 1 View  05/21/2014   CLINICAL DATA:  Status post CABG  EXAM: PORTABLE CHEST - 1 VIEW  COMPARISON:  Portable chest x-ray of May 20, 2014  FINDINGS: The Swan-Ganz catheter and the mediastinal drains and chest tubes have been removed. The internal jugular Cordis sheath on the right remains. The lungs are adequately inflated. There is stable density in the retrocardiac region on the left. The cardiopericardial silhouette is normal in size. The pulmonary  vascularity is mildly prominent centrally but has improved. There are 8 intact sternal wires present.  IMPRESSION: Interval removal of support tubes and lines. There is improving pulmonary vascular congestion and persistent left lower lobe atelectasis.   Electronically Signed   By: David  Martinique   On: 05/21/2014 07:08     Assessment/Plan: S/P Procedure(s) (LRB): CORONARY ARTERY BYPASS GRAFTING (CABG) (N/A) INTRAOPERATIVE TRANSESOPHAGEAL ECHOCARDIOGRAM (N/A)  CV- Maintaining SR, SBPs low normal. Hopefully can transition from IV to po Amiodarone, continue low dose Lopressor for now.  Resume remaining BP meds as able.  Vol overload- Continue diuresis.  Expected postop blood loss anemia- H/H generally stable.  Delirium- MS stable at present.  Postop thrombocytopenia- plts slowly  improving.  Continue to watch.  Ambulation, pulm toilet.  Hopefully ready for transfer to stepdown today.  COLLINS,GINA H 05/22/2014 7:44 AM  I have seen and examined the patient and agree with the assessment and plan as outlined.  Developed post-op Afib yesterday evening, currently NSR on IV amiodarone.  Delirium seems improved.  Transfer step-down but will need sitter at bedside due to impulsive behavior risk of falling.  Stop metoprolol and retart Corgard.  Convert amiodarone to oral.  Continue to hold Micardis for now.  Lovenox for DVT prophylaxis.  Job Holtsclaw H 05/22/2014 8:19 AM

## 2014-05-23 LAB — TYPE AND SCREEN
ABO/RH(D): O POS
Antibody Screen: NEGATIVE
Unit division: 0
Unit division: 0
Unit division: 0
Unit division: 0
Unit division: 0
Unit division: 0

## 2014-05-23 MED ORDER — FUROSEMIDE 10 MG/ML IJ SOLN
40.0000 mg | Freq: Once | INTRAMUSCULAR | Status: AC
  Administered 2014-05-23: 40 mg via INTRAVENOUS
  Filled 2014-05-23: qty 4

## 2014-05-23 MED ORDER — AMIODARONE HCL 200 MG PO TABS
200.0000 mg | ORAL_TABLET | Freq: Two times a day (BID) | ORAL | Status: DC
  Administered 2014-05-23 – 2014-05-24 (×3): 200 mg via ORAL
  Filled 2014-05-23 (×4): qty 1

## 2014-05-23 MED ORDER — INDAPAMIDE 2.5 MG PO TABS
2.5000 mg | ORAL_TABLET | Freq: Every day | ORAL | Status: DC
  Administered 2014-05-24: 2.5 mg via ORAL
  Filled 2014-05-23: qty 1

## 2014-05-23 MED ORDER — POTASSIUM CHLORIDE CRYS ER 20 MEQ PO TBCR
20.0000 meq | EXTENDED_RELEASE_TABLET | Freq: Once | ORAL | Status: AC
  Administered 2014-05-23: 20 meq via ORAL
  Filled 2014-05-23: qty 1

## 2014-05-23 NOTE — Progress Notes (Signed)
EPW removed per order. Ends intact. VSS. Pt instructed of one hour bedrest. Verbalized understanding. Will continue to monitor pt cloesly. 

## 2014-05-23 NOTE — Discharge Instructions (Signed)
Activity: 1.May walk up steps                2.No lifting more than ten pounds for four weeks.                 3.No driving for four weeks.                4.Stop any activity that causes chest pain, shortness of breath, dizziness,                            sweating or excessive weakness.                5.Avoid straining.                6.Continue with your breathing exercises daily.  Diet: Diabetic diet and Low fat, Low salt diet  Wound Care: May shower.  Clean wounds with mild soap and water daily. Contact the office at [phone] if any problems arise.  Coronary Artery Bypass Grafting, Care After Refer to this sheet in the next few weeks. These instructions provide you with information on caring for yourself after your procedure. Your health care provider may also give you more specific instructions. Your treatment has been planned according to current medical practices, but problems sometimes occur. Call your health care provider if you have any problems or questions after your procedure. WHAT TO EXPECT AFTER THE PROCEDURE Recovery from surgery will be different for everyone. Some people feel well after 3 or 4 weeks, while for others it takes longer. After your procedure, it is typical to have the following:  Nausea and a lack of appetite.   Constipation.  Weakness and fatigue.   Depression or irritability.   Pain or discomfort at your incision site. HOME CARE INSTRUCTIONS  Take all medicines as directed by your health care provider. Do not stop taking medicines or start any new medicines without first checking with your health care provider.  Take your pulse as directed by your health care provider.  Perform deep breathing as directed by your health care provider. If you were given a device called an incentive spirometer, use it to practice deep breathing several times a day. Support your chest with a pillow or your arms when you take deep breaths or cough.  Keep incision  areas clean, dry, and protected. Remove or change any bandages (dressings) only as directed by your health care provider. You may have skin adhesive strips over the incision areas. Do not take the strips off. They will fall off on their own.  Check incision areas daily for any swelling, redness, or drainage.  If incisions were made in your legs, do the following:  Avoid crossing your legs.   Avoid sitting for long periods of time. Change positions every 30 minutes.   Elevate your legs when you are sitting.  Wear compression stockings as directed by your health care provider. These stockings help keep blood clots from forming in your legs.  Take showers once your health care provider approves. Until then, only take sponge baths. Pat incisions dry. Do not rub incisions with a washcloth or towel. Do not bathe, swim, or use a hot tub until directed by your health care provider.  Eat foods that are high in fiber, such as raw fruits and vegetables, whole grains, beans, and nuts. Meats should be lean cut. Avoid canned, processed, and fried foods.  Drink enough fluids to  keep your urine clear or pale yellow.  Weigh yourself every day. This helps identify if you are retaining fluid that may make your heart and lungs work harder.  Rest and limit activity as directed by your health care provider. You may be instructed to:  Stop any activity at once if you have chest pain, shortness of breath, irregular heartbeats, or dizziness. Get help right away if you have any of these symptoms.  Move around frequently for short periods or take short walks as directed by your health care provider. Increase your activities gradually. You may need physical therapy or cardiac rehabilitation to help strengthen your muscles and build your endurance.  Avoid lifting, pushing, or pulling anything heavier than 10 lb (4.5 kg) for at least 6 weeks after surgery.  Do not drive until your health care provider  approves.  Ask your health care provider when you may return to work.  Ask your health care provider when you may resume sexual activity.  Follow up with your health care provider as directed. SEEK MEDICAL CARE IF:  You have swelling, redness, increasing pain, or drainage at the site of an incision.  You have a fever.  You have swelling in your ankles or legs.  You have pain in your legs.   You gain 2 or more pounds (0.9 kg) a day.  You are nauseous or vomit.  You have diarrhea. SEEK IMMEDIATE MEDICAL CARE IF:  You have chest pain that goes to your jaw or arms.  You have shortness of breath.   You have a fast or irregular heartbeat.   You notice a "clicking" in your breastbone (sternum) when you move.   You have numbness or weakness in your arms or legs.  You feel dizzy or light-headed.  MAKE SURE YOU:  Understand these instructions.  Will watch your condition.  Will get help right away if you are not doing well or get worse. Document Released: 05/23/2005 Document Revised: 11/08/2013 Document Reviewed: 04/12/2013 [facility] Patient Information 2015 [place], [place]. This information is not intended to replace advice given to you by your health care provider. Make sure you discuss any questions you have with your health care provider.

## 2014-05-23 NOTE — Discharge Summary (Addendum)
Physician Discharge Summary       [address]       [place] [address]             [phone]    Patient ID: Timothy Gallagher MRN: [id number] DOB/AGE: 1936-09-23 78 y.o.  Admit date: 05/19/2014 Discharge date: 05/24/2014  Admission Diagnoses:  Discharge Diagnoses:  Principal Problem:   S/P CABG x 4 Active Problems:   HYPERLIPIDEMIA   HYPERTENSION   Other and unspecified angina pectoris   Coronary artery disease   S/P CABG x 3   Postoperative delirium   Procedure (s):  Coronary Artery Bypass Grafting x 3  Left Internal Mammary Artery to Distal Left Anterior Descending Coronary Artery  Saphenous Vein Graft to Posterior Descending Coronary Artery  Saphenous Vein Graft to Obtuse Marginal Branch of Left Circumflex Coronary Artery  Sapheonous Vein Graft to Diagonal Branch Coronary Artery  Endoscopic Vein Harvest from Right Thigh and Lower Leg by Dr. [name] on 05/19/2014.   History of Presenting Illness: This is a 78 year old retired white male from [place] with no previous history of coronary artery disease but risk factors notable for history of hypertension, hyperlipidemia, and remote history of tobacco abuse. The patient states that over the past 4-5 months he has developed progressive symptoms of exertional shortness of breath. He initially attributed his symptoms to allergies, but he has noted that with regular activity he now get short of breath with any sort of strenuous exertion. He recovers quickly with rest. He has not had any associated chest pain although he has had some vague tightness. He underwent a nuclear myocardial perfusion study that was moderate risk abnormal with a large area of inferior and lateral diminished uptake with partial redistribution. He was seen in consultation by Dr. [name] and subsequently underwent elective diagnostic cardiac catheterization revealing severe three-vessel coronary artery disease with preserved left ventricular function.  He has been referred for possible surgical revascularization.  The patient is married and lives with his wife locally and [place]. He has been retired from [employer] for 16 years. He remains relatively active physically although he is somewhat limited because of peripheral neuropathy with chronic numbness involving both lower extremities related to multiple back surgeries in the distant past. Over the past 6 months, he has developed significant progression of symptoms of exertional shortness of breath with decreased energy and generalized weakness. He has had numerous dizzy spells without syncope. He denies any resting shortness of breath, PND, orthopnea, or lower extremity edema. Dr. [name] discussed the need for coronary artery bypass grafting. Potential risks, benefits, and complications were discussed with the patient and he agreed to proceed with surgery. He underwent a CABG x 3 on 05/19/2014.   Brief Hospital Course:  The patient was extubated the evening of surgery without difficulty. He remained afebrile and hemodynamically stable. He was weaned off Neo synephrine drip. Gordy Councilman, a line, chest tubes, and foley were removed early in the post operative course. Nadolol (as he was on this pre op) was started and titrated accordingly. He did have delirium post op. He initially required a sitter. His mental status has continued to improve and he now at his baseline. He was volume over loaded and diuresed.He did have ABL anemia. He did not require transfusion. His last H and H was 8.79 and 27.3. He also had thrombocytopenia;however, this did resolve as his last platelet count was up to 157,000. He was weaned off the insulin drip.  The patient's HGA1C  pre op was 6.3. He will need further surveillance as an outpatient. The patient was felt surgically stable for transfer from the ICU to [facility] for further convalescence on 05/22/2013. He continues to progress with cardiac rehab. He was ambulating on room  air. He has been tolerating a diet and has had a bowel movement. Epicardial pacing wires and chest tube sutures have been removed. He remains in sinus rhythm in the 70's. He is to take Amiodarone 200 mg by mouth two times daily for 5 days then take Amiodarone 200 mg by mouth daily thereafter.He has been seen and evaluated today by Dr. [name]. He is felt surgically stable for discharge.   Latest Vital Signs: Blood pressure 152/83, pulse 68, temperature 98.4 F (36.9 C), temperature source Oral, resp. rate 20, height 6\' 1"  (1.854 m), weight 199 lb 12.8 oz (90.629 kg), SpO2 100.00%.  Physical Exam: Cardiovascular: RRR, no murmurs, gallops, or rubs.  Pulmonary: Clear to auscultation bilaterally; no rales, wheezes, or rhonchi.  Abdomen: Soft, non tender, bowel sounds present.  Extremities: Mild bilateral lower extremity edema.  Wounds: Clean and dry. No erythema or signs of infection.   Discharge Condition:Stable  Recent laboratory studies:  Lab Results  Component Value Date   WBC 8.8 05/24/2014   HGB 8.9* 05/24/2014   HCT 27.3* 05/24/2014   MCV 89.5 05/24/2014   PLT 157 05/24/2014   Lab Results  Component Value Date   NA 139 05/24/2014   K 4.5 05/24/2014   CL 100 05/24/2014   CO2 24 05/24/2014   CREATININE 0.92 05/24/2014   GLUCOSE 124* 05/24/2014     Diagnostic Studies: Dg Chest 2 View  05/22/2014   CLINICAL DATA:  Atelectasis.  Effusion.  EXAM: CHEST  2 VIEW  COMPARISON:  05/21/2014 and 05/17/2014  FINDINGS: There small bilateral pleural effusions with improved atelectasis at the left base posterior medially. Cardiac silhouette is normal. No pneumothorax. No infiltrates.  IMPRESSION: 1. Small bilateral pleural effusions. 2. Improved atelectasis at the left lung base posterior medially.   Electronically Signed   By: Rozetta Nunnery M.D.   On: 05/22/2014 07:59    Discharge Medications:   Medication List    STOP taking these medications       LIPITOR 40 MG tablet  Generic drug:  atorvastatin      nitroGLYCERIN 0.4 MG SL tablet  Commonly known as:  NITROSTAT      TAKE these medications       acetaminophen 500 MG tablet  Commonly known as:  TYLENOL  Take 500 mg by mouth daily as needed (for pain).     amiodarone 200 MG tablet  Commonly known as:  PACERONE  Take 1 tablet (200 mg total) by mouth 2 (two) times daily. For 5 days;then take Amiodarone 200 mg by mouth daily thereafter.     aspirin 325 MG EC tablet  Take 1 tablet (325 mg total) by mouth daily.     cetirizine 10 MG tablet  Commonly known as:  ZYRTEC  Take 10 mg by mouth daily as needed for allergies.     cyanocobalamin 1000 MCG/ML injection  Commonly known as:  (VITAMIN B-12)  Inject 1.52ml every 3 weeks     fish oil-omega-3 fatty acids 1000 MG capsule  Take 1 g by mouth 2 (two) times daily.     fluticasone 50 MCG/ACT nasal spray  Commonly known as:  FLONASE  Place 1-2 sprays into both nostrils daily as needed for allergies.  indapamide 2.5 MG tablet  Commonly known as:  LOZOL  Take 1 tablet (2.5 mg total) by mouth daily.     LOMOTIL 2.5-0.025 MG per tablet  Generic drug:  diphenoxylate-atropine  Take 1 tablet by mouth 4 (four) times daily as needed for diarrhea or loose stools.     multivitamin with minerals Tabs tablet  Take 1 tablet by mouth daily.     nadolol 20 MG tablet  Commonly known as:  CORGARD  Take 20 mg by mouth 2 (two) times daily.     telmisartan 40 MG tablet  Commonly known as:  MICARDIS  Take 1 tablet (40 mg total) by mouth daily.     temazepam 15 MG capsule  Commonly known as:  RESTORIL  Take 15 mg by mouth at bedtime as needed for sleep.     traMADol 50 MG tablet  Commonly known as:  ULTRAM  Take 1-2 tablets (50-100 mg total) by mouth every 4 (four) hours as needed for severe pain.        The patient has been discharged on:   1.Beta Blocker:  Yes [ x  ]                              No   [   ]                              If No, reason:  2.Ace Inhibitor/ARB: Yes [    ]                                     No  [  x  ]                                     If No, reason: On Micardis pre op so this was resumed. Preserved LVEF  3.Statin:   Yes [   ]                  No  [  x ]                  If No, reason: Allergy  4.Shela Commons:  Yes  [ x  ]                  No   [   ]                  If No, reason:  Follow Up Appointments: Follow-up Information   Follow up with Richardson Dopp, PA-C On 06/14/2014. (Appointment time is at 11:30 am)    Specialty:  Physician Assistant   Contact information:   [address] [address] [phone]       Follow up with Rexene Alberts, MD On 07/03/2014. (PA/LAT CXR to be taken (at [facility] which is in the same building as Dr. Guy Sandifer office) on 07/03/2014 at 12:00 pm ;Appointment with Dr. [name] is at 1:00 pm)    Specialty:  Cardiothoracic Surgery   Contact information:   [address] [place] [address] [phone]       Follow up with Georgetta Haber, MD. (Call for a follow  up regarding further surveillance of HGA1C 6.3)    Specialty:  Internal Medicine   Contact information:   [place] [address] [phone]       Signed: Lars Pinks MPA-C 05/24/2014, 9:01 AM

## 2014-05-23 NOTE — Progress Notes (Addendum)
      [address]       [place] [address]             [phone]        4 Days Post-Op Procedure(s) (LRB): CORONARY ARTERY BYPASS GRAFTING (CABG) (N/A) INTRAOPERATIVE TRANSESOPHAGEAL ECHOCARDIOGRAM (N/A)  Subjective: Patient eating breakfast. He has no complaints except he wants to go home.  Objective: Vital signs in last 24 hours: Temp:  [98.2 F (36.8 C)-98.3 F (36.8 C)] 98.3 F (36.8 C) (07/07 0402) Pulse Rate:  [61-87] 65 (07/07 0452) Cardiac Rhythm:  [-] Normal sinus rhythm (07/06 2005) Resp:  [18-29] 22 (07/07 0452) BP: (108-145)/(59-83) 135/78 mmHg (07/07 0402) SpO2:  [97 %-100 %] 97 % (07/07 0452) Weight:  [204 lb (92.534 kg)] 204 lb (92.534 kg) (07/07 0500)  Pre op weight 88 kg Current Weight  05/23/14 204 lb (92.534 kg)       Intake/Output from previous day: 07/06 0701 - 07/07 0700 In: 290.1 [P.O.:240; I.V.:50.1] Out: 600 [Urine:600]   Physical Exam:  Cardiovascular: RRR, no murmurs, gallops, or rubs. Pulmonary: Clear to auscultation bilaterally; no rales, wheezes, or rhonchi. Abdomen: Soft, non tender, bowel sounds present. Extremities: Mild bilateral lower extremity edema. Wounds: Clean and dry.  No erythema or signs of infection.  Lab Results: CBC: Recent Labs  05/21/14 0430 05/22/14 0635  WBC 8.9 7.2  HGB 8.1* 8.7*  HCT 23.5* 25.7*  PLT 69* 95*   BMET:  Recent Labs  05/21/14 0430 05/22/14 0635  NA 139 137  K 4.5 5.0  CL 103 101  CO2 23 23  GLUCOSE 132* 118*  BUN 25* 25*  CREATININE 1.17 0.96  CALCIUM 8.1* 8.3*    PT/INR:  Lab Results  Component Value Date   INR 1.46 05/19/2014   INR 1.47 05/19/2014   INR 1.04 05/17/2014   ABG:  INR: Will add last result for INR, ABG once components are confirmed Will add last 4 CBG results once components are confirmed  Assessment/Plan:  1. CV - A fib with RVR. SR in the 60'sthis am. On Amiodarone 400 bid and Nadolol 20 bid. Was on Micardis 80 daily pre op. May need  to restart at 40 for better BP control. Also, will decrease Amiodarone to 200 bid as HR in the 60's. 2.  Pulmonary - Encourage incentive spirometer. Tessalon 200 tid for cough. 3. Volume Overload - On Lozol. 4. Delirium-has sitter. MS stable this am. 5. Thrombocytopenia-platlets up to 95,000 6. ABL anemia-H and H up to 8.7 and 25.7 7. Not on statin as is allergic to Lipitor, Crestor 8. Pre op HGA1C 6.3. Will need follow up as an outpatient. 9. Remove EPW  ZIMMERMAN,DONIELLE MPA-C 05/23/2014,7:31 AM  I have seen and examined the patient and agree with the assessment and plan as outlined.  Some SOB overnight w/ some wheezing on exam today.  Will give 1 dose IV lasix and resume Lozol tomorrow.  Mobilize.  Possible d/c home 1-2 days.  Rolly Magri H 05/23/2014 8:06 AM

## 2014-05-23 NOTE — Progress Notes (Signed)
Patient's mentation has improved throughout the night. Sitter has been at the bedside.  Patient had some audible wheezing and was feeling short of breath, also a non-productive cough; oxygen is 98% 2LNC.  Notified MD on call. Orders received for tessalon 200mg  po TID and respiratory consult.  Patient states he feels better after treatment. Patient later wanted to remove his oxygen and he is currently 97% on room air. Will continue to monitor.

## 2014-05-23 NOTE — Progress Notes (Signed)
Pt amb 369ft pushing walker with some complaints of SOB. Pt rested once. O2 sats at end of walk were 99% on RA. Will continue to monitor pt closely.

## 2014-05-23 NOTE — Progress Notes (Signed)
CARDIAC REHAB PHASE I   PRE:  Rate/Rhythm: 69 SR    BP: sitting 124/67    SaO2: 94 RA  MODE:  Ambulation: 350 ft   POST:  Rate/Rhythm: 80 SR    BP: sitting 136/75     SaO2: 98 RA  Pt able to get out of bed fairly independently. Used RW, assist x1.  Weak in general. Return to bed to have EP pulled. Some SOB noted and pt agreed when asked. SaO2 98 RA. Assisted back to bed, wife helped. Encouraged IS and 2 more walks.  [id number]  Timothy Gallagher [place] CES, ACSM 05/23/2014 10:08 AM

## 2014-05-23 NOTE — Care Management Note (Unsigned)
    Page 1 of 1   05/23/2014     4:42:21 PM CARE MANAGEMENT NOTE 05/23/2014  Patient:  Timothy Gallagher, Timothy Gallagher   Account Number:  [id number]  Date Initiated:  05/23/2014  Documentation initiated by:  Lovette Merta  Subjective/Objective Assessment:   Pt s/p CABG x 3 on 05/19/14.  PTA, pt independent, lives with spouse.     Action/Plan:   Will follow for dc needs as pt progresses.   Anticipated DC Date:  05/24/2014   Anticipated DC Plan:  [place]  CM consult      Choice offered to / List presented to:             Status of service:  In process, will continue to follow Medicare Important Message given?  YES (If response is "NO", the following Medicare IM given date fields will be blank) Date Medicare IM given:  05/23/2014 Medicare IM given by:  Loveah Like Date Additional Medicare IM given:   Additional Medicare IM given by:    Discharge Disposition:    Per UR Regulation:  Reviewed for med. necessity/level of care/duration of stay  If discussed at [place] of Stay Meetings, dates discussed:    Comments:

## 2014-05-24 ENCOUNTER — Encounter (HOSPITAL_COMMUNITY): Payer: Self-pay | Admitting: Thoracic Surgery (Cardiothoracic Vascular Surgery)

## 2014-05-24 LAB — CBC
HCT: 27.3 % — ABNORMAL LOW (ref 39.0–52.0)
Hemoglobin: 8.9 g/dL — ABNORMAL LOW (ref 13.0–17.0)
MCH: 29.2 pg (ref 26.0–34.0)
MCHC: 32.6 g/dL (ref 30.0–36.0)
MCV: 89.5 fL (ref 78.0–100.0)
Platelets: 157 10*3/uL (ref 150–400)
RBC: 3.05 MIL/uL — ABNORMAL LOW (ref 4.22–5.81)
RDW: 15.7 % — ABNORMAL HIGH (ref 11.5–15.5)
WBC: 8.8 10*3/uL (ref 4.0–10.5)

## 2014-05-24 LAB — BASIC METABOLIC PANEL
Anion gap: 15 (ref 5–15)
BUN: 24 mg/dL — ABNORMAL HIGH (ref 6–23)
CO2: 24 mEq/L (ref 19–32)
Calcium: 8.8 mg/dL (ref 8.4–10.5)
Chloride: 100 mEq/L (ref 96–112)
Creatinine, Ser: 0.92 mg/dL (ref 0.50–1.35)
GFR calc Af Amer: >90 mL/min (ref >90)
GFR calc non Af Amer: 80 mL/min — ABNORMAL LOW (ref >90)
Glucose, Bld: 124 mg/dL — ABNORMAL HIGH (ref 70–99)
Potassium: 4.5 mEq/L (ref 3.7–5.3)
Sodium: 139 mEq/L (ref 137–147)

## 2014-05-24 MED ORDER — AMIODARONE HCL 200 MG PO TABS: 200.0000 mg | ORAL_TABLET | Freq: Two times a day (BID) | ORAL | Status: DC

## 2014-05-24 MED ORDER — TELMISARTAN 40 MG PO TABS: 40.0000 mg | ORAL_TABLET | Freq: Every day | ORAL | Status: DC

## 2014-05-24 MED ORDER — ASPIRIN 325 MG PO TBEC: 325.0000 mg | DELAYED_RELEASE_TABLET | Freq: Every day | ORAL | Status: DC

## 2014-05-24 MED ORDER — TRAMADOL HCL 50 MG PO TABS: 50.0000 mg | ORAL_TABLET | ORAL | Status: DC | PRN

## 2014-05-24 MED ORDER — IRBESARTAN 150 MG PO TABS
150.0000 mg | ORAL_TABLET | Freq: Every day | ORAL | Status: DC
  Administered 2014-05-24: 150 mg via ORAL
  Filled 2014-05-24: qty 1

## 2014-05-24 NOTE — Progress Notes (Addendum)
      [address]       [place] [address]             [phone]        5 Days Post-Op Procedure(s) (LRB): CORONARY ARTERY BYPASS GRAFTING (CABG) (N/A) INTRAOPERATIVE TRANSESOPHAGEAL ECHOCARDIOGRAM (N/A)  Subjective: Patient cleaned himself up this am. He feels fairly well. Hopes to go home.  Objective: Vital signs in last 24 hours: Temp:  [98.1 F (36.7 C)-98.8 F (37.1 C)] 98.4 F (36.9 C) (07/08 0524) Pulse Rate:  [66-70] 68 (07/08 0524) Cardiac Rhythm:  [-] Normal sinus rhythm (07/08 0750) Resp:  [18-20] 20 (07/08 0524) BP: (119-152)/(63-83) 152/83 mmHg (07/08 0524) SpO2:  [96 %-100 %] 100 % (07/08 0524) Weight:  [199 lb 12.8 oz (90.629 kg)] 199 lb 12.8 oz (90.629 kg) (07/08 0524)  Pre op weight 88 kg Current Weight  05/24/14 199 lb 12.8 oz (90.629 kg)      Intake/Output from previous day: 07/07 0701 - 07/08 0700 In: 200 [P.O.:200] Out: 1453 [Urine:1450; Stool:3]   Physical Exam:  Cardiovascular: RRR Pulmonary: Clear to auscultation bilaterally; no rales, wheezes, or rhonchi. Abdomen: Soft, non tender, bowel sounds present. Extremities: Trace lower extremity edema. Wounds: Clean and dry.  No erythema or signs of infection.  Lab Results: CBC:  Recent Labs  05/22/14 0635 05/24/14 0323  WBC 7.2 8.8  HGB 8.7* 8.9*  HCT 25.7* 27.3*  PLT 95* 157   BMET:   Recent Labs  05/22/14 0635 05/24/14 0323  NA 137 139  K 5.0 4.5  CL 101 100  CO2 23 24  GLUCOSE 118* 124*  BUN 25* 24*  CREATININE 0.96 0.92  CALCIUM 8.3* 8.8    PT/INR:  Lab Results  Component Value Date   INR 1.46 05/19/2014   INR 1.47 05/19/2014   INR 1.04 05/17/2014   ABG:  INR: Will add last result for INR, ABG once components are confirmed Will add last 4 CBG results once components are confirmed  Assessment/Plan:  1. CV - A fib with RVR. SR in the 70's this am. On Amiodarone 200 bid and Nadolol 20 bid. Was on Micardis 80 daily pre op. Will restart at 40 for  better BP control.  2.  Pulmonary - Encourage incentive spirometer. Tessalon 200 tid for cough. 3. Volume Overload - Given Lasix IV yesterday. Start Lozol (as on pre op) today. 4. Delirium-had sitter. No longer needs. MS stable again this am. 5. Thrombocytopenia resolved-platelets 157,000 6. ABL anemia-H and H up to 8.9 and 27.3 7. Not on statin as is allergic to Lipitor, Crestor 8. Pre op HGA1C 6.3. Will need follow up as an outpatient. 9. Possible discharge today  ZIMMERMAN,DONIELLE MPA-C 05/24/2014,8:05 AM    I have seen and examined the patient and agree with the assessment and plan as outlined.  D/C home today.  Restart Micardis at 1/2 preop dose.   [name],[name] 05/24/2014 8:32 AM

## 2014-05-24 NOTE — Progress Notes (Signed)
DC IV and tele per MD orders and protocol; DC chest tube sutures per MD orders and protocol; DC instructions reviewed with pt and wife at bedside; no further questions from pt or wife.  Rowe Pavy, RN

## 2014-05-24 NOTE — Progress Notes (Signed)
CARDIAC REHAB PHASE I   Ed completed with pt and wife. Good comprehension. Discussed sugar reduction. Somewhat interested in CRPII and will send referral to [facility]. Pt declined d/c video. [place], [place], ACSM 05/24/2014 9:32 AM

## 2014-05-26 ENCOUNTER — Telehealth: Payer: Self-pay | Admitting: *Deleted

## 2014-05-26 ENCOUNTER — Other Ambulatory Visit: Payer: Self-pay | Admitting: *Deleted

## 2014-05-26 DIAGNOSIS — R609 Edema, unspecified: Secondary | ICD-10-CM

## 2014-05-26 MED ORDER — FUROSEMIDE 40 MG PO TABS: 40.0000 mg | ORAL_TABLET | Freq: Every day | ORAL | Status: DC

## 2014-05-26 MED ORDER — POTASSIUM CHLORIDE CRYS ER 10 MEQ PO TBCR: 20.0000 meq | EXTENDED_RELEASE_TABLET | Freq: Every day | ORAL | Status: DC

## 2014-05-26 NOTE — Telephone Encounter (Signed)
Timothy Gallagher calls with concerns of increasing edema of his hands, feet and ankles.  He is also having a very dry mouth.  He is drinking plenty of fluids and elevating his lower extremities properly.  He is not having any shortness of breath.  I suggested Biotine for his dry mouth.  Lasix and Potassium will be e-prescribed to his pharmacy. He agrees with this plan.

## 2014-05-29 ENCOUNTER — Telehealth: Payer: Self-pay | Admitting: Internal Medicine

## 2014-05-29 NOTE — Telephone Encounter (Signed)
Pt just had triple by pass heart surgery, and is unable to walk a lot, pt needs his b12 injection and need to know if Jenny Reichmann can come to the car and give him his injection.

## 2014-05-29 NOTE — Telephone Encounter (Signed)
Per Dr. Arnoldo Morale, pt needs to wait till he is feeling better.  We are not allowed to go to the car and give injections

## 2014-05-29 NOTE — Telephone Encounter (Signed)
appt was made for pt, pt is aware that he will have to come inside  and get the injection

## 2014-05-30 ENCOUNTER — Telehealth: Payer: Self-pay | Admitting: *Deleted

## 2014-05-30 NOTE — Telephone Encounter (Signed)
erro  neous encounter

## 2014-05-30 NOTE — Telephone Encounter (Signed)
Mrs. Mielke calls to relate her husband's complaint's of urinary problems of only having drops of urine when he tries to urinate. This has been for two days. He urinated a lot after he started the Lasix on 7/11 per his wife.He also is having severe pain in the back of his neck for two days.  The Tramadol is not helping.  I called his urologist, Dr. McDiarmid, and arranged for him to be seen today at 1:45 PM.  For the neck pain, the P.A. suggested a two week regime of ibuprofen 800mg  q8h.  I gave all these instructions to his wife and she repeated them back to me.

## 2014-05-31 ENCOUNTER — Telehealth: Payer: Self-pay | Admitting: Internal Medicine

## 2014-05-31 NOTE — Telephone Encounter (Signed)
Pt just had open heart surgery, and is unable to come in to see matt right now, pt would like to know if Catalina [facility]igua will call him in an rx for his neck/shoulder pain, pt states he saw matt about a month before his surgery regarding his neck pain. States he has 800 mg of ibuprofen but it does not help.  Send to [facility]

## 2014-06-01 ENCOUNTER — Telehealth: Payer: Self-pay | Admitting: *Deleted

## 2014-06-01 NOTE — Telephone Encounter (Signed)
Timothy Gallagher calls with concerns of being short winded after exertion.  He is taking the Lasix and K that our office previously prescribed for him.  He says his lower extremity edema is gone and he is having good urinary output in the foley/bag that was inserted this week d/t post op retention.  I told him I felt he should now contact his cardiologist and he agreed.

## 2014-06-02 NOTE — Telephone Encounter (Signed)
Called and spoke with pt and pt states he has an appt with Dr. Kathlen Mody on Tuesday at 3 pm.  This has been taken care of.

## 2014-06-02 NOTE — Telephone Encounter (Signed)
The pain that we saw patient for was thought to be related to his Coronary disease. This should have improved with surgery. If he is still experiencing symptoms, he needs to be seen and re-evaluated. Also, we do not know what medications he is on for pain following his surgery, which may be complicating his picture. He will need ROV if pain is still present, and if cardiology thinks it is not cardiac in origin.

## 2014-06-06 ENCOUNTER — Encounter: Payer: Self-pay | Admitting: Physician Assistant

## 2014-06-06 ENCOUNTER — Ambulatory Visit (INDEPENDENT_AMBULATORY_CARE_PROVIDER_SITE_OTHER): Payer: Medicare Other | Admitting: Physician Assistant

## 2014-06-06 ENCOUNTER — Telehealth: Payer: Self-pay | Admitting: *Deleted

## 2014-06-06 VITALS — BP 130/80 | HR 69 | Ht 73.0 in | Wt 185.0 lb

## 2014-06-06 DIAGNOSIS — I10 Essential (primary) hypertension: Secondary | ICD-10-CM

## 2014-06-06 DIAGNOSIS — I2510 Atherosclerotic heart disease of native coronary artery without angina pectoris: Secondary | ICD-10-CM

## 2014-06-06 DIAGNOSIS — I4891 Unspecified atrial fibrillation: Secondary | ICD-10-CM

## 2014-06-06 DIAGNOSIS — E785 Hyperlipidemia, unspecified: Secondary | ICD-10-CM

## 2014-06-06 DIAGNOSIS — I480 Paroxysmal atrial fibrillation: Secondary | ICD-10-CM

## 2014-06-06 DIAGNOSIS — M542 Cervicalgia: Secondary | ICD-10-CM

## 2014-06-06 LAB — BASIC METABOLIC PANEL
BUN: 36 mg/dL — ABNORMAL HIGH (ref 6–23)
CO2: 28 mEq/L (ref 19–32)
Calcium: 9.2 mg/dL (ref 8.4–10.5)
Chloride: 104 mEq/L (ref 96–112)
Creatinine, Ser: 1.8 mg/dL — ABNORMAL HIGH (ref 0.4–1.5)
GFR: 38.21 mL/min — ABNORMAL LOW (ref >60.00)
Glucose, Bld: 106 mg/dL — ABNORMAL HIGH (ref 70–99)
Potassium: 4.8 mEq/L (ref 3.5–5.1)
Sodium: 139 mEq/L (ref 135–145)

## 2014-06-06 MED ORDER — TELMISARTAN 40 MG PO TABS: ORAL_TABLET | ORAL | Status: DC

## 2014-06-06 MED ORDER — AMIODARONE HCL 200 MG PO TABS: 200.0000 mg | ORAL_TABLET | Freq: Every day | ORAL | Status: DC

## 2014-06-06 MED ORDER — INDAPAMIDE 2.5 MG PO TABS: ORAL_TABLET | ORAL | Status: DC

## 2014-06-06 MED ORDER — NADOLOL 20 MG PO TABS: 20.0000 mg | ORAL_TABLET | ORAL | Status: DC

## 2014-06-06 NOTE — Patient Instructions (Signed)
CONTINUE AMIODARONE 200 MG DAILY FOR 1 MORE MONTH THEN STOP  HOLD THE NADOLOL UNTIL FURTHER NOTIFIED BY CARDIOLOGY PER SCOTT WEAVER, PAC  CONTINUE YOUR OTHER MEDICATIONS AS USUAL  LAB WORK TODAY; BMET  You have been referred to [facility] physician recommends that you schedule a follow-up appointment in: [place]

## 2014-06-06 NOTE — Telephone Encounter (Signed)
pt notified about lab results and to hold the Micardis and Indapamide. Bmet 7/27 at the [place] office when he gets his B12 inj. Pt verbalized understanding to Plan of Care

## 2014-06-06 NOTE — Progress Notes (Signed)
Cardiology Office Note    Date:  06/06/2014   ID:  KEGAN MCKEITHAN, DOB December 13, 1935, MRN [id number]  PCP:  Georgetta Haber, MD  Cardiologist:  Dr. Daneen Schick       History of Present Illness: Timothy Gallagher is a 78 y.o. male ex-smoker with a history of HTN, HL. He was seen by Dr. [name] last month with a 6-8 week history of exertional dyspnea and chest tightness. Myocardial perfusion study was abnormal. Cardiac catheterization was arranged. This demonstrated significant three-vessel disease. He was referred for CABG. He was admitted 7/3-7/8 and underwent CABG (LIMA-LAD, SVG-PDA, SVG-OM, SVG-diagonal) by Dr. [name]. Postoperative course was fairly uneventful aside from postop delirium and acute blood loss anemia. He did develop atrial fibrillation. He converted to NSR on amiodarone.  He returns for follow up.  He is doing well. Denies significant chest soreness.  He did get placed on Lasix for several days b/c of increased edema.  He recently noted low BP with associated dizziness (BP 80s).  He stopped the Nadolol on his own.  He feels better.  Denies significant dyspnea.  Denies orthopnea, PND, edema.  Denies syncope.  Denies palps.     Studies:  - LHC (04/2014):  Ostial LAD 80%, distal LAD 70-80%, proximal diagonal 90%, mid to distal circumflex 70%, multifocal segmental RCA 90%, EF 55% >>> CABG  - Nuclear (04/2014):  Inferior infarct with mild peri-infarct ischemia, EF 59%, low risk   Recent Labs: 08/29/2013: Direct LDL 143.2; HDL Cholesterol by NMR 42.20; TSH 2.26  04/03/2014: Pro B Natriuretic peptide (BNP) 73.0  05/17/2014: ALT 12  05/24/2014: Creatinine 0.92; Hemoglobin 8.9*; Potassium 4.5   Wt Readings from Last 3 Encounters:  05/24/14 199 lb 12.8 oz (90.629 kg)  05/24/14 199 lb 12.8 oz (90.629 kg)  05/17/14 195 lb (88.451 kg)     Past Medical History  Diagnosis Date  . ACNE ROSACEA 06/27/2009  . ACTINIC KERATOSIS, HEAD 04/18/2009  . Acute maxillary sinusitis 05/14/2010  .  ALLERGIC RHINITIS 04/09/2007  . B12 DEFICIENCY 06/07/2007  . BACK PAIN WITH RADICULOPATHY 04/24/2008  . CHEST WALL PAIN, ACUTE 06/15/2009  . Chronic maxillary sinusitis 05/29/2008  . COLITIS 04/27/2009  . COLONIC POLYPS, HX OF 04/27/2009    tubular adenomas  . DERMATITIS, ATOPIC 10/12/2007  . DIVERTICULOSIS, COLON 04/27/2009  . ECCHYMOSES, SPONTANEOUS 06/27/2008  . Elevated sedimentation rate 05/02/2009  . ESOPHAGEAL STRICTURE 04/27/2009  . GASTRITIS, CHRONIC 04/27/2009  . HYPERLIPIDEMIA 03/13/2008  . HYPERTENSION 04/09/2007  . Irritable bowel syndrome 08/11/2007  . KIDNEY DISEASE 04/09/2007  . NECK PAIN 09/14/2008  . NEUROPATHY, IDIOPATHIC PERIPHERAL NEC 08/11/2007  . OSTEOARTHRITIS, WRIST, RIGHT 08/05/2010  . Cancer     skin  . Coronary artery disease 05/12/2014    Cath 05/12/2014 w/ severe 3-vessel CAD and preserved LV function, EF 55%  . Shortness of breath   . S/P CABG x 4 05/19/2014    LIMA to LAD, SVG to diag, SVG to OM, SVG to PDA, EVH via right thigh and leg  . Postoperative delirium 05/20/2014    Current Outpatient Prescriptions  Medication Sig Dispense Refill  . acetaminophen (TYLENOL) 500 MG tablet Take 500 mg by mouth daily as needed (for pain).       [name] Kitchen amiodarone (PACERONE) 200 MG tablet Take 1 tablet (200 mg total) by mouth 2 (two) times daily. For 5 days;then take Amiodarone 200 mg by mouth daily thereafter.  60 tablet  1  . aspirin EC 325 MG EC tablet  Take 1 tablet (325 mg total) by mouth daily.      . cetirizine (ZYRTEC) 10 MG tablet Take 10 mg by mouth daily as needed for allergies.       . cyanocobalamin (,VITAMIN B-12,) 1000 MCG/ML injection Inject 1.13ml every 3 weeks  10 mL  3  . diphenoxylate-atropine (LOMOTIL) 2.5-0.025 MG per tablet Take 1 tablet by mouth 4 (four) times daily as needed for diarrhea or loose stools.      . fish oil-omega-3 fatty acids 1000 MG capsule Take 1 g by mouth 2 (two) times daily.       . fluticasone (FLONASE) 50 MCG/ACT nasal spray Place 1-2 sprays  into both nostrils daily as needed for allergies.       . furosemide (LASIX) 40 MG tablet Take 1 tablet (40 mg total) by mouth daily. Take one tablet daily  10 tablet  1  . indapamide (LOZOL) 2.5 MG tablet Take 1 tablet (2.5 mg total) by mouth daily.  90 tablet  1  . Multiple Vitamin (MULTIVITAMIN WITH MINERALS) TABS tablet Take 1 tablet by mouth daily.      . nadolol (CORGARD) 20 MG tablet Take 20 mg by mouth 2 (two) times daily.      . potassium chloride (K-DUR,KLOR-CON) 10 MEQ tablet Take 2 tablets (20 mEq total) by mouth daily.  20 tablet  1  . telmisartan (MICARDIS) 40 MG tablet Take 1 tablet (40 mg total) by mouth daily.  30 tablet  1  . temazepam (RESTORIL) 15 MG capsule Take 15 mg by mouth at bedtime as needed for sleep.      . traMADol (ULTRAM) 50 MG tablet Take 1-2 tablets (50-100 mg total) by mouth every 4 (four) hours as needed for severe pain.  30 tablet  0   No current facility-administered medications for this visit.    Allergies:   Ciprofloxacin; Lipitor; Mycophenolate mofetil; Rosuvastatin; Amoxicillin; and Penicillins   Social History:  The patient  reports that he quit smoking about 12 years ago. He has never used smokeless tobacco. He reports that he does not drink alcohol or use illicit drugs.   Family History:  The patient's family history includes COPD in his father; Heart disease in his father; Hernia in his mother. There is no history of Colon cancer.   ROS:  Please see the history of present illness.   He has noted some neck pain since discharge from the hospital. He has been using Tylenol without relief. It is improving.   All other systems reviewed and negative.   PHYSICAL EXAM: VS:  BP 130/80  Pulse 69  Ht 6\' 1"  (1.854 m)  Wt 185 lb (83.915 kg)  BMI 24.41 kg/m2 Well nourished, well developed, in no acute distress HEENT: normal Neck:  no JVD Cardiac:  normal S1, S2;  RRR; no murmur Chest: Median sternotomy well healed without erythema or discharge Lungs:    clear to auscultation bilaterally, no wheezing, rhonchi or rales Abd: soft, nontender, no hepatomegaly Ext:  no edema Skin: warm and dry Neuro:  CNs 2-12 intact, no focal abnormalities noted  EKG:  NSR, HR 69, normal axis, nonspecific ST-T wave changes     ASSESSMENT AND PLAN:  1. Atherosclerosis of native coronary artery of native heart without angina pectoris: He is progressing well since recent bypass surgery. Continue aspirin. He is currently off of beta blocker in the setting of recent hypotension. This will eventually be resumed (possibly after amiodarone discontinued). He will be referred  to cardiac rehabilitation. 2. Unspecified essential hypertension: Recent blood pressure has been running low. He will continue to hold nadolol for now. Check a basic metabolic panel today. He is now off of Lasix and potassium. If his blood pressure continues to run low, he has been told to hold his indapamide and micardis.   3. HYPERLIPIDEMIA:  He has a hx of intolerance to statins.  May be able to consider low dose Crestor 3 days a week at some point. 4. Paroxysmal atrial fibrillation: Maintaining NSR. Plan to stop amiodarone after 1 month. 5. NECK PAIN: This appears to be musculoskeletal. I have recommended continue Tylenol and heat. Follow up with primary care.   6. Disposition: Follow up withDr. [name] in 6 weeks.   Signed, Versie Starks, MHS 06/06/2014 2:03 PM    [facility] [address], [place], [place]  [address] Phone: [phone]; Fax: [phone]

## 2014-06-07 ENCOUNTER — Ambulatory Visit: Payer: Medicare Other | Admitting: Physician Assistant

## 2014-06-07 ENCOUNTER — Other Ambulatory Visit (INDEPENDENT_AMBULATORY_CARE_PROVIDER_SITE_OTHER): Payer: Medicare Other

## 2014-06-07 DIAGNOSIS — I10 Essential (primary) hypertension: Secondary | ICD-10-CM

## 2014-06-07 LAB — BASIC METABOLIC PANEL
BUN: 29 mg/dL — ABNORMAL HIGH (ref 6–23)
CO2: 26 mEq/L (ref 19–32)
Calcium: 9.3 mg/dL (ref 8.4–10.5)
Chloride: 107 mEq/L (ref 96–112)
Creatinine, Ser: 1.6 mg/dL — ABNORMAL HIGH (ref 0.4–1.5)
GFR: 44.58 mL/min — ABNORMAL LOW (ref >60.00)
Glucose, Bld: 110 mg/dL — ABNORMAL HIGH (ref 70–99)
Potassium: 5.1 mEq/L (ref 3.5–5.1)
Sodium: 141 mEq/L (ref 135–145)

## 2014-06-08 ENCOUNTER — Encounter: Payer: Self-pay | Admitting: Physician Assistant

## 2014-06-08 ENCOUNTER — Telehealth: Payer: Self-pay | Admitting: Interventional Cardiology

## 2014-06-08 ENCOUNTER — Ambulatory Visit (INDEPENDENT_AMBULATORY_CARE_PROVIDER_SITE_OTHER): Payer: Medicare Other | Admitting: Physician Assistant

## 2014-06-08 ENCOUNTER — Ambulatory Visit: Payer: Medicare Other | Admitting: *Deleted

## 2014-06-08 ENCOUNTER — Telehealth: Payer: Self-pay | Admitting: *Deleted

## 2014-06-08 VITALS — BP 120/84 | HR 78 | Temp 98.3°F | Resp 18 | Wt 185.0 lb

## 2014-06-08 DIAGNOSIS — M542 Cervicalgia: Secondary | ICD-10-CM

## 2014-06-08 DIAGNOSIS — J069 Acute upper respiratory infection, unspecified: Secondary | ICD-10-CM

## 2014-06-08 DIAGNOSIS — E538 Deficiency of other specified B group vitamins: Secondary | ICD-10-CM

## 2014-06-08 MED ORDER — CYANOCOBALAMIN 1000 MCG/ML IJ SOLN
1000.0000 ug | Freq: Once | INTRAMUSCULAR | Status: AC
  Administered 2014-06-08: 1000 ug via INTRAMUSCULAR

## 2014-06-08 NOTE — Telephone Encounter (Signed)
pt  wife aware per [facility] Apa pharm-d pt needs an ekg tomorrow for qtc prolongation. possible interaction with cipro and amiodarone. nurse ekg appt sch. for 7/24 @ 10am.pt wife aware and verbalized understanding.

## 2014-06-08 NOTE — Progress Notes (Signed)
Pre visit review using our clinic review tool, if applicable. No additional management support is needed unless otherwise documented below in the visit note. 

## 2014-06-08 NOTE — Telephone Encounter (Signed)
called pt. pt wife sts that Cipro 250mg  bid was prscribed by pt nephrologist dx kidney infection.pt has currently taken x3days. pt wife sts that pt nephrologist has added an additional 5 days.pt is currently taking amiodarone and there is an interaction. Per S.Weaver,PA pt needs a repeat bmet next wk. Lab appt made for 7/28.adv pt wife I would have S.Weaver review previous ekg and call back with his recommendations.as cipro and amiodarone inretaction could cause prolonged qt.pt wife aware of lab apptthe patient wife agreeable with plan and verbalized understanding.t

## 2014-06-08 NOTE — Telephone Encounter (Signed)
lmptcb for lab results. Pt had bmet done 7/22 day after he saw PA when he was supposed to get done on 7/27 when he was going to have his B12 inj at the [place] office, this looks like was cancelled.

## 2014-06-08 NOTE — Telephone Encounter (Signed)
New message     Pharmacist want Dr [name] to know that pt is on amiodarone and another physician wrote a pres for cipro.  This could be a drug interaction.

## 2014-06-08 NOTE — Progress Notes (Signed)
Subjective:    Patient ID: Timothy Gallagher, male    DOB: Jul 23, 1936, 78 y.o.   MRN: [id number]  Neck Pain  This is a new problem. The current episode started 1 to 4 weeks ago. The problem occurs constantly. The problem has been gradually improving. The pain is associated with nothing. The pain is present in the left side and midline (no radiating pain). The quality of the pain is described as aching. The pain is at a severity of 7/10. The symptoms are aggravated by twisting. The pain is worse during the day. Stiffness is present in the morning. Pertinent negatives include no chest pain, fever, headaches, leg pain, numbness, pain with swallowing, paresis, photophobia, syncope, tingling, trouble swallowing, visual change, weakness or weight loss. He has tried acetaminophen and NSAIDs for the symptoms. The treatment provided mild relief.  Cough This is a new problem. The current episode started 1 to 4 weeks ago. The problem has been gradually worsening. The problem occurs hourly. The cough is productive of sputum. Associated symptoms include nasal congestion and postnasal drip. Pertinent negatives include no chest pain, chills, ear congestion, ear pain, fever, headaches, heartburn, hemoptysis, myalgias, rash, rhinorrhea, sore throat, shortness of breath, sweats, weight loss or wheezing. Nothing aggravates the symptoms. Treatments tried: steroid spray. The treatment provided mild relief. There is no history of asthma or COPD.      Review of Systems  Constitutional: Negative for fever, chills and weight loss.  HENT: Positive for congestion and postnasal drip. Negative for ear pain, rhinorrhea, sinus pressure, sore throat and trouble swallowing.   Eyes: Negative for photophobia.  Respiratory: Positive for cough. Negative for hemoptysis, shortness of breath and wheezing.   Cardiovascular: Negative for chest pain and syncope.  Gastrointestinal: Negative for heartburn, nausea, vomiting and diarrhea.    Musculoskeletal: Positive for neck pain. Negative for myalgias.  Skin: Negative for rash.  Neurological: Negative for tingling, syncope, weakness, numbness and headaches.  All other systems reviewed and are negative.     Past Medical History  Diagnosis Date  . ACNE ROSACEA 06/27/2009  . ACTINIC KERATOSIS, HEAD 04/18/2009  . Acute maxillary sinusitis 05/14/2010  . ALLERGIC RHINITIS 04/09/2007  . B12 DEFICIENCY 06/07/2007  . BACK PAIN WITH RADICULOPATHY 04/24/2008  . CHEST WALL PAIN, ACUTE 06/15/2009  . Chronic maxillary sinusitis 05/29/2008  . COLITIS 04/27/2009  . COLONIC POLYPS, HX OF 04/27/2009    tubular adenomas  . DERMATITIS, ATOPIC 10/12/2007  . DIVERTICULOSIS, COLON 04/27/2009  . ECCHYMOSES, SPONTANEOUS 06/27/2008  . Elevated sedimentation rate 05/02/2009  . ESOPHAGEAL STRICTURE 04/27/2009  . GASTRITIS, CHRONIC 04/27/2009  . HYPERLIPIDEMIA 03/13/2008  . HYPERTENSION 04/09/2007  . Irritable bowel syndrome 08/11/2007  . KIDNEY DISEASE 04/09/2007  . NECK PAIN 09/14/2008  . NEUROPATHY, IDIOPATHIC PERIPHERAL NEC 08/11/2007  . OSTEOARTHRITIS, WRIST, RIGHT 08/05/2010  . Cancer     skin  . Coronary artery disease 05/12/2014    Cath 05/12/2014 w/ severe 3-vessel CAD and preserved LV function, EF 55%  . Shortness of breath   . S/P CABG x 4 05/19/2014    LIMA to LAD, SVG to diag, SVG to OM, SVG to PDA, EVH via right thigh and leg  . Postoperative delirium 05/20/2014    History   Social History  . Marital Status: Married    Spouse Name: N/A    Number of Children: 4  . Years of Education: N/A   Occupational History  . retired     from [employer]  Main Topics  . Smoking status: Former Smoker    Quit date: 11/17/2001  . Smokeless tobacco: Never Used  . Alcohol Use: No  . Drug Use: No  . Sexual Activity: Yes   Other Topics Concern  . Not on file   Social History Narrative  . No narrative on file    Past Surgical History  Procedure Laterality Date  . Hernia repair     . Lamenectomy    . Lumbar fusion    . Tonsillectomy    . Incision and drainage wound with foreign body removal Left 12/20/2013    Procedure: INCISION AND DRAINAGE LEFT INDEX FINGER;  Surgeon: Tennis Must, MD;  Location: [facility];  Service: Orthopedics;  Laterality: Left;  . Esophagogastroduodenoscopy    . Colonoscopy    . Flexible sigmoidoscopy    . Varicose vein surgery Left   . Cardiac catheterization    . Finger surgery      cut off end of finger  . Coronary artery bypass graft N/A 05/19/2014    Procedure: CORONARY ARTERY BYPASS GRAFTING (CABG);  Surgeon: Rexene Alberts, MD;  Location: [facility];  Service: Open Heart Surgery;  Laterality: N/A;  Times 4 using left internal mammary artery and endoscopically harvested right saphenous vein  . Intraoperative transesophageal echocardiogram N/A 05/19/2014    Procedure: INTRAOPERATIVE TRANSESOPHAGEAL ECHOCARDIOGRAM;  Surgeon: Rexene Alberts, MD;  Location: [facility];  Service: Open Heart Surgery;  Laterality: N/A;    Family History  Problem Relation Age of Onset  . Hernia Mother   . Heart disease Father   . COPD Father   . Colon cancer Neg Hx     Allergies  Allergen Reactions  . Ciprofloxacin Swelling  . Lipitor [Atorvastatin]     REACTION: nausea and blurred vision  . Mycophenolate Mofetil     REACTION: unspecified  . Rosuvastatin     Unknown   . Amoxicillin Rash  . Penicillins Rash    Current Outpatient Prescriptions on File Prior to Visit  Medication Sig Dispense Refill  . acetaminophen (TYLENOL) 500 MG tablet Take 500 mg by mouth daily as needed (for pain).       [name] Kitchen aspirin EC 325 MG EC tablet Take 1 tablet (325 mg total) by mouth daily.      . cetirizine (ZYRTEC) 10 MG tablet Take 10 mg by mouth daily as needed for allergies.       . cyanocobalamin (,VITAMIN B-12,) 1000 MCG/ML injection Inject 1.57ml every 3 weeks  10 mL  3  . diphenoxylate-atropine (LOMOTIL) 2.5-0.025 MG per tablet Take 1 tablet by mouth 4 (four) times daily as  needed for diarrhea or loose stools.      . fish oil-omega-3 fatty acids 1000 MG capsule Take 1 g by mouth 2 (two) times daily.       . fluticasone (FLONASE) 50 MCG/ACT nasal spray Place 1-2 sprays into both nostrils daily as needed for allergies.       . indapamide (LOZOL) 2.5 MG tablet HOLD UNTIL FURTHER ADVISED      . Multiple Vitamin (MULTIVITAMIN WITH MINERALS) TABS tablet Take 1 tablet by mouth daily.      [name] Kitchen NITROSTAT 0.4 MG SL tablet Place 0.4 mg under the tongue every 5 (five) minutes as needed.       . tamsulosin (FLOMAX) 0.4 MG CAPS capsule Take 0.4 mg by mouth daily after breakfast. Urination problem      . temazepam (RESTORIL) 15 MG capsule Take 15  mg by mouth at bedtime as needed for sleep.      . traMADol (ULTRAM) 50 MG tablet Take 1-2 tablets (50-100 mg total) by mouth every 4 (four) hours as needed for severe pain.  30 tablet  0  . amiodarone (PACERONE) 200 MG tablet Take 1 tablet (200 mg total) by mouth daily.  30 tablet  0  . nadolol (CORGARD) 20 MG tablet Take 1 tablet (20 mg total) by mouth as directed. HOLD UNTIL FURTHER NOTIFIED BY CARDIOLOGY      . telmisartan (MICARDIS) 40 MG tablet HOLD UNTIL FURTHER ADVISED       No current facility-administered medications on file prior to visit.    EXAM: BP 120/84  Pulse 78  Temp(Src) 98.3 F (36.8 C) (Oral)  Resp 18  Wt 185 lb (83.915 kg)     Objective:   Physical Exam  Nursing note and vitals reviewed. Constitutional: He is oriented to person, place, and time. He appears well-developed and well-nourished. No distress.  HENT:  Head: Normocephalic and atraumatic.  Right Ear: External ear normal.  Left Ear: External ear normal.  Nose: Nose normal.  Mouth/Throat: No oropharyngeal exudate.  Oropharynx is slightly erythematous, no exudate. Bilateral TMs normal. Bilateral frontal and maxillary sinuses non-TTP.  Eyes: Conjunctivae and EOM are normal. Pupils are equal, round, and reactive to light.  Neck: Normal range of  motion. Neck supple.  Cardiovascular: Normal rate, regular rhythm and intact distal pulses.   Pulmonary/Chest: Effort normal and breath sounds normal. No stridor. No respiratory distress. He has no wheezes. He has no rales. He exhibits no tenderness.  Musculoskeletal: Normal range of motion. He exhibits no edema and no tenderness.  Lymphadenopathy:    He has no cervical adenopathy.  Neurological: He is alert and oriented to person, place, and time.  Skin: Skin is warm and dry. No rash noted. He is not diaphoretic. No erythema. No pallor.  Psychiatric: He has a normal mood and affect. His behavior is normal. Judgment and thought content normal.      Lab Results  Component Value Date   WBC 8.8 05/24/2014   HGB 8.9* 05/24/2014   HCT 27.3* 05/24/2014   PLT 157 05/24/2014   GLUCOSE 110* 06/07/2014   CHOL 221* 08/29/2013   TRIG 158.0* 08/29/2013   HDL 42.20 08/29/2013   LDLDIRECT 143.2 08/29/2013   LDLCALC 128* 09/28/2012   ALT 12 05/17/2014   AST 19 05/17/2014   NA 141 06/07/2014   K 5.1 06/07/2014   CL 107 06/07/2014   CREATININE 1.6* 06/07/2014   BUN 29* 06/07/2014   CO2 26 06/07/2014   TSH 2.26 08/29/2013   PSA 3.52 08/29/2013   INR 1.46 05/19/2014   HGBA1C 6.3* 05/17/2014          Assessment & Plan:  Jamiere was seen today for neck pain and cough.  Diagnoses and associated orders for this visit:  B12 DEFICIENCY - cyanocobalamin ((VITAMIN B-12)) injection 1,000 mcg; Inject 1 mL (1,000 mcg total) into the muscle once.  Neck pain Comments: Possibly due to operating table position during recent surgery. Will continue Tylenol, heat and gentle massage. No Ibuprofen due to kidney function.  Upper respiratory infection Comments: Mild symptoms, no clear need for abx at this time. OTC symptom treatment, watchful waiting.    Advised to not take NSAIDs due to kidney function. Will continue tylenol.  Return precautions provided, and patient handout on URI.  Plan to follow up as needed, or for  worsening or  persistent symptoms despite treatment.  Patient Instructions  Due to your recently poor kidney function, you SHOULD NOT TAKE IBUPROFEN, NAPROXEN, ALEVE, ADVIL, MOTRIN.  Continue taking Tylenol as it seems to have provided help. Also use heat and gentle massage to help the soreness.  Plain Over the Counter Mucinex (NOT Mucinex D) for thick secretions  Force NON dairy fluids, drinking plenty of water is best.    Over the Counter Flonase OR Nasacort AQ 1 spray in each nostril twice a day as needed. Use the "crossover" technique into opposite nostril spraying toward opposite ear @ 45 degree angle, not straight up into nostril.   Plain Over the Counter Allegra (NOT D )  160 daily , OR Loratidine 10 mg , OR Zyrtec 10 mg @ bedtime  as needed for itchy eyes & sneezing.  Saline Irrigation and Saline Sprays can also help reduce symptoms.  If emergency symptoms discussed during visit developed, seek medical attention immediately.  Followup as needed, or for worsening or persistent symptoms despite treatment.

## 2014-06-08 NOTE — Patient Instructions (Addendum)
Due to your recently poor kidney function, you SHOULD NOT TAKE IBUPROFEN, NAPROXEN, ALEVE, ADVIL, MOTRIN.  Continue taking Tylenol as it seems to have provided help. Also use heat and gentle massage to help the soreness.  Plain Over the Counter Mucinex (NOT Mucinex D) for thick secretions  Force NON dairy fluids, drinking plenty of water is best.    Over the Counter Flonase OR Nasacort AQ 1 spray in each nostril twice a day as needed. Use the "crossover" technique into opposite nostril spraying toward opposite ear @ 45 degree angle, not straight up into nostril.   Plain Over the Counter Allegra (NOT D )  160 daily , OR Loratidine 10 mg , OR Zyrtec 10 mg @ bedtime  as needed for itchy eyes & sneezing.  Saline Irrigation and Saline Sprays can also help reduce symptoms.  If emergency symptoms discussed during visit developed, seek medical attention immediately.  Followup as needed, or for worsening or persistent symptoms despite treatment.Upper Respiratory Infection, Adult An upper respiratory infection (URI) is also known as the common cold. It is often caused by a type of germ (virus). Colds are easily spread (contagious). You can pass it to others by kissing, coughing, sneezing, or drinking out of the same glass. Usually, you get better in 1 or 2 weeks.  HOME CARE   Only take medicine as told by your doctor.  Use a warm mist humidifier or breathe in steam from a hot shower.  Drink enough water and fluids to keep your pee (urine) clear or pale yellow.  Get plenty of rest.  Return to work when your temperature is back to normal or as told by your doctor. You may use a face mask and wash your hands to stop your cold from spreading. GET HELP RIGHT AWAY IF:   After the first few days, you feel you are getting worse.  You have questions about your medicine.  You have chills, shortness of breath, or brown or red spit (mucus).  You have yellow or brown snot (nasal discharge) or pain in  the face, especially when you bend forward.  You have a fever, puffy (swollen) neck, pain when you swallow, or white spots in the back of your throat.  You have a bad headache, ear pain, sinus pain, or chest pain.  You have a high-pitched whistling sound when you breathe in and out (wheezing).  You have a lasting cough or cough up blood.  You have sore muscles or a stiff neck. MAKE SURE YOU:   Understand these instructions.  Will watch your condition.  Will get help right away if you are not doing well or get worse. Document Released: 04/21/2008 Document Revised: 01/26/2012 Document Reviewed: 02/08/2014 [facility] Patient Information 2015 [place], [place]. This information is not intended to replace advice given to you by your health care provider. Make sure you discuss any questions you have with your health care provider.

## 2014-06-08 NOTE — Telephone Encounter (Signed)
Follow up ° ° ° ° ° °Returning Timothy Gallagher's call °

## 2014-06-09 ENCOUNTER — Ambulatory Visit (INDEPENDENT_AMBULATORY_CARE_PROVIDER_SITE_OTHER): Payer: Medicare Other

## 2014-06-09 VITALS — BP 124/82 | HR 110 | Ht 73.0 in | Wt 185.0 lb

## 2014-06-09 DIAGNOSIS — I480 Paroxysmal atrial fibrillation: Secondary | ICD-10-CM

## 2014-06-09 DIAGNOSIS — I4891 Unspecified atrial fibrillation: Secondary | ICD-10-CM

## 2014-06-09 NOTE — Telephone Encounter (Signed)
Follow up    Office calling back to speak with Timothy Gallagher from this am.

## 2014-06-09 NOTE — Telephone Encounter (Signed)
See phone notes from 06/08/14 from Rush Farmer, [place]

## 2014-06-09 NOTE — Telephone Encounter (Signed)
**Note De-Identified  Obfuscation** Timothy Gallagher is advised to contact the pts pharmacy to get advice on which antibiotic would be best to treat the pts UTI as he is allergic to Cipro. She verbalized understanding.

## 2014-06-09 NOTE — Progress Notes (Signed)
**Note De-Identified  Obfuscation** 1.) Reason for visit: EKG for qtc prolongation per Dahlia Byes D  2.) Name of MD requesting visit: Dr Carolin Sicks, Phar D  3.) H&P: the pt has been prescribed Cipro 500 mg for UTI per Dr McDermont at [facility]. The pt is also taking Amiodarone and there is a possible interaction for QTC prolongation when taking Cipro and Amiodarone together.  4.) ROS related to problem:  Paroxysmal Atrial Fibrillation  5.) Assessment and plan per MD: EKG obtained and given to Dr Johnsie Cancel (DOD) for his review. Per Dr Johnsie Cancel the pt is advised that he may take Cipro while taking Amiodarone as his QTC prolongation has improved since last EKG that was obtained on 7/21. The pt questions why he was prescribed Cipro as he is allergic. I asked the pt if Dr McDermont was aware of his Cipro allergy when it was prescribed and the pt and his wife said that he was aware. I then called [facility] and spoke with a nurse Baxter Flattery who stated that Cipro is not listed as an allergy in the pts chart and she stated that she will add it to his list. Baxter Flattery states that she will contact the pharmacy for advise on what would be the best antibiotic for this pt., she will consult with Dr McDermont and then call the pt with instructions on the new antibiotic that the pt can take. The pt is aware that someone from [facility] will be contacting him with new antibiotic medication and instructions.

## 2014-06-12 ENCOUNTER — Ambulatory Visit: Payer: Medicare Other | Admitting: *Deleted

## 2014-06-12 ENCOUNTER — Other Ambulatory Visit: Payer: Medicare Other

## 2014-06-13 ENCOUNTER — Other Ambulatory Visit (INDEPENDENT_AMBULATORY_CARE_PROVIDER_SITE_OTHER): Payer: Medicare Other

## 2014-06-13 ENCOUNTER — Other Ambulatory Visit: Payer: Self-pay

## 2014-06-13 DIAGNOSIS — I10 Essential (primary) hypertension: Secondary | ICD-10-CM

## 2014-06-13 LAB — BASIC METABOLIC PANEL
BUN: 14 mg/dL (ref 6–23)
CO2: 27 mEq/L (ref 19–32)
Calcium: 8.9 mg/dL (ref 8.4–10.5)
Chloride: 107 mEq/L (ref 96–112)
Creatinine, Ser: 0.9 mg/dL (ref 0.4–1.5)
GFR: 82.94 mL/min (ref >60.00)
Glucose, Bld: 89 mg/dL (ref 70–99)
Potassium: 4.1 mEq/L (ref 3.5–5.1)
Sodium: 139 mEq/L (ref 135–145)

## 2014-06-14 ENCOUNTER — Encounter: Payer: Medicare Other | Admitting: Physician Assistant

## 2014-06-22 ENCOUNTER — Telehealth: Payer: Self-pay | Admitting: Internal Medicine

## 2014-06-22 NOTE — Telephone Encounter (Signed)
Patient Information:  Caller Name: Tyric  Phone: [phone]  Patient: Timothy Gallagher, Timothy Gallagher  Gender: Male  DOB: 1936-10-28  Age: 78 Years  PCP: Benay Pillow (Adults only, leaving end of July 2015)  Office Follow Up:  Does the office need to follow up with this patient?: Yes  Instructions For The Office: Patient has cardiac surgeon appt on 07/03/14. Cardiology appt in September.  He has appt at the [facility] office for B12 injecktion on 06/29/14. He would like to see physician at that time for evaluation. Declined appt within next 3 days. Tylenol is helping but not sustained relief. PLEASE CONTACT PATIENT regarding pain relief/neck pain/and appt .  RN Note:  Patient has surgeon appt on 07/03/14. Cardiology appt in September.  He has appt at the office for B12 injecktion on 06/29/14. He would like to see physician at that time for evaluation. Declined appt within next 3 days. Tylenol is helping but not sustained relief. PLEASE CONTACT PATIENT.  Symptoms  Reason For Call & Symptoms: Patient had Cardiac Bypass X4 on May 19, 2014.   He states on the day he was discharge he had neck pain/back of neck.  Pain is intermittent and Tylenol "eases it off".  He reports worse at night and pain with turning head side to side.  no radiation of pain.  He saw PA Berline Lopes on 06/08/14 and it was thought to be related to surgery. he has tried , Tylenol , heat but it does not last .  Reviewed Health History In EMR: Yes  Reviewed Medications In EMR: Yes  Reviewed Allergies In EMR: Yes  Reviewed Surgeries / Procedures: Yes  Date of Onset of Symptoms: 05/25/2014  Treatments Tried: Tylenol  Treatments Tried Worked: Yes  Guideline(s) Used:  Neck Pain or Stiffness  Disposition Per Guideline:   See Within 3 Days in Office  Reason For Disposition Reached:   Moderate neck pain (e.g., interferes with normal activities like work or school)  Advice Given:  Apply Cold to the Area Or Heat:   During the first 2 days after a mild  injury, apply a cold pack or an ice bag (wrapped in a towel) for 20 minutes four times a day. After 2 days, apply a heating pad or hot water bottle to the most painful area for 20 minutes whenever the pain flares up. Wrap hot water bottles or heating pads in a towel to avoid burns.  Stretching Exercises:  After 48 hours of protecting the neck, begin gentle stretching exercises.  Improve the tone of the neck muscles with 2 or 3 minutes of gentle stretching exercises per day such as touching the chin to each shoulder, touching the ear to each shoulder, and moving the head forward and backward.  Don't apply any resistance during these stretching exercises.  Pain Medicines:  For pain relief, you can take either acetaminophen, ibuprofen, or naproxen.  They are over-the-counter (OTC) pain drugs. You can buy them at the drugstore.  Acetaminophen (e.g., Tylenol):  Regular Strength Tylenol: Take 650 mg (two 325 mg pills) by mouth every 4-6 hours as needed. Each Regular Strength Tylenol pill has 325 mg of acetaminophen.  Call Back If:  Numbness or weakness occurs  Pain lasts for more than 2 weeks  You become worse.  RN Overrode Recommendation:  Make Appointment  Patient has surgeon appt on 07/03/14. Cardiology appt in September.  He has appt at the office for B12 injecktion on 06/29/14. He would like to see physician at that time  for evaluation. Declined appt within next 3 days. Tylenol is helping but not sustained relief. PLEASE CONTACT PATIENT.

## 2014-06-23 NOTE — Telephone Encounter (Signed)
Pt scheduled for 06/29/14 with Dr Shawna Orleans

## 2014-06-28 ENCOUNTER — Other Ambulatory Visit: Payer: Self-pay | Admitting: Thoracic Surgery (Cardiothoracic Vascular Surgery)

## 2014-06-28 DIAGNOSIS — I10 Essential (primary) hypertension: Secondary | ICD-10-CM

## 2014-06-29 ENCOUNTER — Ambulatory Visit: Payer: Medicare Other | Admitting: *Deleted

## 2014-06-29 ENCOUNTER — Ambulatory Visit (INDEPENDENT_AMBULATORY_CARE_PROVIDER_SITE_OTHER): Payer: Medicare Other | Admitting: *Deleted

## 2014-06-29 ENCOUNTER — Ambulatory Visit: Payer: Medicare Other | Admitting: Internal Medicine

## 2014-06-29 DIAGNOSIS — E538 Deficiency of other specified B group vitamins: Secondary | ICD-10-CM

## 2014-06-29 MED ORDER — CYANOCOBALAMIN 1000 MCG/ML IJ SOLN
1000.0000 ug | Freq: Once | INTRAMUSCULAR | Status: AC
  Administered 2014-06-29: 1000 ug via INTRAMUSCULAR

## 2014-07-03 ENCOUNTER — Ambulatory Visit (INDEPENDENT_AMBULATORY_CARE_PROVIDER_SITE_OTHER): Payer: Self-pay | Admitting: Thoracic Surgery (Cardiothoracic Vascular Surgery)

## 2014-07-03 ENCOUNTER — Ambulatory Visit
Admission: RE | Admit: 2014-07-03 | Discharge: 2014-07-03 | Disposition: A | Discharge status: Home / Self Care | Payer: Medicare Other | Source: Ambulatory Visit | Attending: Thoracic Surgery (Cardiothoracic Vascular Surgery) | Admitting: Thoracic Surgery (Cardiothoracic Vascular Surgery)

## 2014-07-03 ENCOUNTER — Encounter: Payer: Self-pay | Admitting: Thoracic Surgery (Cardiothoracic Vascular Surgery)

## 2014-07-03 VITALS — BP 143/85 | HR 108 | Resp 16 | Ht 72.0 in | Wt 191.5 lb

## 2014-07-03 DIAGNOSIS — Z951 Presence of aortocoronary bypass graft: Secondary | ICD-10-CM

## 2014-07-03 DIAGNOSIS — I25119 Atherosclerotic heart disease of native coronary artery with unspecified angina pectoris: Secondary | ICD-10-CM

## 2014-07-03 DIAGNOSIS — I10 Essential (primary) hypertension: Secondary | ICD-10-CM

## 2014-07-03 DIAGNOSIS — I2510 Atherosclerotic heart disease of native coronary artery without angina pectoris: Secondary | ICD-10-CM

## 2014-07-03 DIAGNOSIS — I209 Angina pectoris, unspecified: Secondary | ICD-10-CM

## 2014-07-03 NOTE — Progress Notes (Signed)
[address]       [place] [address]             [phone]     CARDIOTHORACIC SURGERY OFFICE NOTE  Referring Provider is Sinclair Grooms, MD PCP is Georgetta Haber, MD   HPI:  Patient returns to the office for routine followup status post coronary artery bypass grafting x3 on 05/19/2014 for severe three-vessel coronary artery disease with normal LV systolic function and exertional shortness of breath consistent with angina pectoris.  His early postoperative recovery in the hospital was notable for transient postoperative atrial fibrillation which resolved with amiodarone therapy. He was discharged from the hospital in sinus rhythm. Since hospital discharge he has been seen in followup by Richardson Dopp at [facility] 06/06/2014 and he returns to our office for routine followup today.  His physical recovery has been a little slow but overall uncomplicated. He states that he still feels weak at times he gets dizzy when he stands up suddenly from a sitting or supine position. He has not had any significant chest pain. He denies any shortness of breath. He has had some pain in his neck that seems to be positional and has been slow to gradually improve since hospital discharge. He's had some nausea that has been slow to improve. He has had some mild tremulousness and a slight resting tremor. His appetite is fair. He had a dry cough that seems to have resolved.   Current Outpatient Prescriptions  Medication Sig Dispense Refill  . acetaminophen (TYLENOL) 500 MG tablet Take 500 mg by mouth daily as needed (for pain).       [name] Kitchen amiodarone (PACERONE) 200 MG tablet Take 1 tablet (200 mg total) by mouth daily.  30 tablet  0  . cetirizine (ZYRTEC) 10 MG tablet Take 10 mg by mouth daily as needed for allergies.       . cyanocobalamin (,VITAMIN B-12,) 1000 MCG/ML injection Inject 1.38ml every 3 weeks  10 mL  3  . diphenoxylate-atropine (LOMOTIL) 2.5-0.025 MG per tablet Take 1  tablet by mouth 4 (four) times daily as needed for diarrhea or loose stools.      . fish oil-omega-3 fatty acids 1000 MG capsule Take 1 g by mouth 2 (two) times daily.       . fluticasone (FLONASE) 50 MCG/ACT nasal spray Place 1-2 sprays into both nostrils daily as needed for allergies.       . Multiple Vitamin (MULTIVITAMIN WITH MINERALS) TABS tablet Take 1 tablet by mouth daily.      [name] Kitchen NITROSTAT 0.4 MG SL tablet Place 0.4 mg under the tongue every 5 (five) minutes as needed.       . tamsulosin (FLOMAX) 0.4 MG CAPS capsule Take 0.4 mg by mouth daily after breakfast. Urination problem      . temazepam (RESTORIL) 15 MG capsule Take 15 mg by mouth at bedtime as needed for sleep.      [name] Kitchen aspirin EC 325 MG EC tablet Take 1 tablet (325 mg total) by mouth daily.      . furosemide (LASIX) 40 MG tablet       . indapamide (LOZOL) 2.5 MG tablet HOLD UNTIL FURTHER ADVISED      . nadolol (CORGARD) 20 MG tablet Take 1 tablet (20 mg total) by mouth as directed. HOLD UNTIL FURTHER NOTIFIED BY CARDIOLOGY      . telmisartan (MICARDIS) 40 MG tablet HOLD UNTIL FURTHER ADVISED      .  traMADol (ULTRAM) 50 MG tablet Take 1-2 tablets (50-100 mg total) by mouth every 4 (four) hours as needed for severe pain.  30 tablet  0   No current facility-administered medications for this visit.      Physical Exam:   BP 143/85  Pulse 108  Resp 16  Ht 6' (1.829 m)  Wt 191 lb 8 oz (86.864 kg)  BMI 25.97 kg/m2  SpO2 98%  General:  Well-appearing  Chest:   Clear  CV:   Regular rate and rhythm  Incisions:  Healing nicely, sternum is stable  Abdomen:  Soft and nontender  Extremities:  Warm and well-perfused  Diagnostic Tests:  CHEST 2 VIEW  COMPARISON: Portable chest x-ray of May 22, 2014  FINDINGS:  The lungs are mildly hyperinflated but clear. The heart and  pulmonary vascularity are normal. There is no pleural effusion.  There are 8 intact sternal wires present. No acute bony abnormality  is demonstrated.    IMPRESSION:  There is no acute cardiopulmonary abnormality. Hyperinflation is  consistent with COPD.  Electronically Signed  By: David Martinique  On: 07/03/2014 12:37     Impression:  Patient is progressing well approximately 6 weeks status post coronary artery bypass grafting x3. His physical recovery has been gradual but uncomplicated. He has had some pain in his neck that is likely related to pre-existing degenerative disc disease in the cervical spine with exacerbation of pain following surgery.  Plan:  I've instructed the patient to stop taking amiodarone and resume taking nadolol.  I've encouraged patient to continue to gradually increase his physical activity with his primary limitation at this point remaining that he refrain from heavy lifting or strenuous use of his arms or shoulders for least another 6-8 weeks. I've strongly encouraged him to enroll an outpatient cardiac rehabilitation program. Once she gets a bit stronger he could resume driving an automobile as long as he's not having any dizzy spells. All of his questions been addressed. The patient will return in 2 months for routine followup. He is already scheduled to see Dr. [name] for routine followup sometime in September.   Valentina Gu. [name], MD 07/03/2014 12:53 PM

## 2014-07-03 NOTE — Patient Instructions (Addendum)
The patient should continue to avoid any heavy lifting or strenuous use of arms or shoulders for at least a total of three months from the time of surgery.  The patient is encouraged to enroll and participate in the outpatient cardiac rehab program beginning as soon as practical.  Stop taking amiodarone and resume taking nadolol ([place]orgard)  The patient may return to driving an automobile as long as they are no longer requiring oral narcotic pain relievers during the daytime and no longer having any dizzy spells.  It would be wise to start driving only short distances during the daylight and gradually increase from there as they feel comfortable.

## 2014-07-05 ENCOUNTER — Telehealth: Payer: Self-pay | Admitting: Interventional Cardiology

## 2014-07-05 DIAGNOSIS — I10 Essential (primary) hypertension: Secondary | ICD-10-CM

## 2014-07-05 MED ORDER — NADOLOL 20 MG PO TABS: ORAL_TABLET | ORAL | Status: DC

## 2014-07-05 NOTE — Telephone Encounter (Signed)
Pt was seen by Dr. [name] post heart surgery on 07/03/14 . Pt's medication Amiodarone was D/C and he started taking Nadolol 20 mg once a day starting on yesterday (Tuesday 8/18) and today. Pt's BP today is 96/57 , he feels weak.   Truitt Merle aware she recommended for pt to take Nadolol 10 mg (1/2 of 20 mg tablet) once daily. Pt to keep a record take his BP if he has symptoms. Pt to  keep his appointment with Dr. [name] on 07/26/14. Pt. Is aware , he verbalized understanding.

## 2014-07-05 NOTE — Telephone Encounter (Signed)
°  Patient started taking [place] once daily on Tuesday. He is feeling weak and BP is 96/57. Please call and advise.

## 2014-07-20 ENCOUNTER — Encounter: Payer: Self-pay | Admitting: Family Medicine

## 2014-07-20 ENCOUNTER — Ambulatory Visit (INDEPENDENT_AMBULATORY_CARE_PROVIDER_SITE_OTHER): Payer: Medicare Other | Admitting: Family Medicine

## 2014-07-20 VITALS — BP 158/94 | HR 79 | Temp 97.7°F | Ht 72.0 in | Wt 192.0 lb

## 2014-07-20 DIAGNOSIS — I209 Angina pectoris, unspecified: Secondary | ICD-10-CM

## 2014-07-20 DIAGNOSIS — I2510 Atherosclerotic heart disease of native coronary artery without angina pectoris: Secondary | ICD-10-CM

## 2014-07-20 DIAGNOSIS — Z23 Encounter for immunization: Secondary | ICD-10-CM

## 2014-07-20 DIAGNOSIS — E538 Deficiency of other specified B group vitamins: Secondary | ICD-10-CM

## 2014-07-20 DIAGNOSIS — I25119 Atherosclerotic heart disease of native coronary artery with unspecified angina pectoris: Secondary | ICD-10-CM

## 2014-07-20 DIAGNOSIS — E785 Hyperlipidemia, unspecified: Secondary | ICD-10-CM

## 2014-07-20 DIAGNOSIS — I10 Essential (primary) hypertension: Secondary | ICD-10-CM

## 2014-07-20 DIAGNOSIS — D649 Anemia, unspecified: Secondary | ICD-10-CM

## 2014-07-20 LAB — COMPREHENSIVE METABOLIC PANEL
ALT: 13 U/L (ref 0–53)
AST: 22 U/L (ref 0–37)
Albumin: 3.4 g/dL — ABNORMAL LOW (ref 3.5–5.2)
Alkaline Phosphatase: 64 U/L (ref 39–117)
BUN: 15 mg/dL (ref 6–23)
CO2: 26 mEq/L (ref 19–32)
Calcium: 9.4 mg/dL (ref 8.4–10.5)
Chloride: 107 mEq/L (ref 96–112)
Creatinine, Ser: 0.9 mg/dL (ref 0.4–1.5)
GFR: 83.95 mL/min (ref >60.00)
Glucose, Bld: 95 mg/dL (ref 70–99)
Potassium: 4.9 mEq/L (ref 3.5–5.1)
Sodium: 141 mEq/L (ref 135–145)
Total Bilirubin: 0.6 mg/dL (ref 0.2–1.2)
Total Protein: 7.3 g/dL (ref 6.0–8.3)

## 2014-07-20 LAB — CBC
HCT: 34.7 % — ABNORMAL LOW (ref 39.0–52.0)
Hemoglobin: 11.3 g/dL — ABNORMAL LOW (ref 13.0–17.0)
MCHC: 32.6 g/dL (ref 30.0–36.0)
MCV: 86.9 fl (ref 78.0–100.0)
Platelets: 208 10*3/uL (ref 150.0–400.0)
RBC: 3.99 Mil/uL — ABNORMAL LOW (ref 4.22–5.81)
RDW: 15.1 % (ref 11.5–15.5)
WBC: 4.4 10*3/uL (ref 4.0–10.5)

## 2014-07-20 LAB — LDL CHOLESTEROL, DIRECT: Direct LDL: 131.2 mg/dL

## 2014-07-20 LAB — FERRITIN: Ferritin: 30.2 ng/mL (ref 22.0–322.0)

## 2014-07-20 MED ORDER — CYANOCOBALAMIN 1000 MCG/ML IJ SOLN
1000.0000 ug | Freq: Once | INTRAMUSCULAR | Status: AC
  Administered 2014-07-20: 1000 ug via INTRAMUSCULAR

## 2014-07-20 MED ORDER — PRAVASTATIN SODIUM 40 MG PO TABS: 40.0000 mg | ORAL_TABLET | Freq: Every day | ORAL | Status: DC

## 2014-07-20 NOTE — Patient Instructions (Addendum)
Labs today-likely to trial pravastatin and see how you do since you had allergy to crestor and lipitor.   Anemia after surgery could be contributing to fatigue  Start taking 1/2 nadolol each day over next week. When you follow up with Dr. [name], may need adjustments.   Health Maintenance Due  Topic Date Due  . Influenza Vaccine  06/17/2014

## 2014-07-20 NOTE — Assessment & Plan Note (Signed)
Check CBC today.  

## 2014-07-20 NOTE — Progress Notes (Signed)
Pre visit review using our clinic review tool, if applicable. No additional management support is needed unless otherwise documented below in the visit note. 

## 2014-07-20 NOTE — Assessment & Plan Note (Signed)
Poorly controlled. Restart half pill nadolol and follow up with cardiology next week

## 2014-07-20 NOTE — Assessment & Plan Note (Addendum)
Pending lipids, With history of recent CABG/CAD, rx for pravastatin 40mg  sent in-consider further titration if tolerable. History of allergy to crestor and lipitor unfortunately.

## 2014-07-20 NOTE — Progress Notes (Signed)
Timothy Reddish, MD Phone: [phone] Subjective:  Patient presents today to establish care with me as their new primary care provider. Patient was formerly a patient of Dr. Arnoldo Morale. Chief complaint-noted.   Hyperlipidemia On statin: on red yeast rice Regular exercise: trying to walk 1 mile per day ROS- no chest pain or shortness of breath. No myalgias  Hypertension BP Readings from Last 3 Encounters:  07/20/14 158/94  07/03/14 143/85  06/09/14 124/82  Home BP monitoring-no Compliant with medications-not taking nadolol as prescribed-states he was told to take it primarily for palpitations ROS-Denies any CP, HA, SOB, blurry vision, LE edema.   CAD/fatigue/anemia Recent CABG x 4 vessel. Taking aspirin 325 mg. On red yeast rice, last LDL >130. Cardiac rehab was not covered by insurance. Did have anemia after surgery with hemoglobin down to close to 9. ROS-does have some fatigue with walking but denies shortness of breath.   The following were reviewed and entered/updated in epic: Past Medical History  Diagnosis Date  . ACNE ROSACEA 06/27/2009  . ACTINIC KERATOSIS, HEAD 04/18/2009  . Acute maxillary sinusitis 05/14/2010  . ALLERGIC RHINITIS 04/09/2007  . B12 DEFICIENCY 06/07/2007  . BACK PAIN WITH RADICULOPATHY 04/24/2008  . CHEST WALL PAIN, ACUTE 06/15/2009  . Chronic maxillary sinusitis 05/29/2008  . COLITIS 04/27/2009  . COLONIC POLYPS, HX OF 04/27/2009    tubular adenomas  . DERMATITIS, ATOPIC 10/12/2007  . DIVERTICULOSIS, COLON 04/27/2009  . ECCHYMOSES, SPONTANEOUS 06/27/2008  . Elevated sedimentation rate 05/02/2009  . ESOPHAGEAL STRICTURE 04/27/2009  . GASTRITIS, CHRONIC 04/27/2009  . HYPERLIPIDEMIA 03/13/2008  . HYPERTENSION 04/09/2007  . Irritable bowel syndrome 08/11/2007  . KIDNEY DISEASE 04/09/2007  . NECK PAIN 09/14/2008  . NEUROPATHY, IDIOPATHIC PERIPHERAL NEC 08/11/2007  . OSTEOARTHRITIS, WRIST, RIGHT 08/05/2010  . Cancer     skin  . Coronary artery disease 05/12/2014   Cath 05/12/2014 w/ severe 3-vessel CAD and preserved LV function, EF 55%  . S/P CABG x 4 05/19/2014    LIMA to LAD, SVG to diag, SVG to OM, SVG to PDA, EVH via right thigh and leg  . Postoperative delirium 05/20/2014   Patient Active Problem List   Diagnosis Date Noted  . S/P CABG x 4 05/19/2014    Priority: High  . Coronary artery disease 05/12/2014    Priority: High  . Anemia 07/20/2014    Priority: Medium  . BPH (benign prostatic hyperplasia) 12/05/2013    Priority: Medium  . ACTINIC KERATOSIS, HEAD 04/18/2009    Priority: Medium  . HYPERLIPIDEMIA 03/13/2008    Priority: Medium  . B12 DEFICIENCY 06/07/2007    Priority: Medium  . HYPERTENSION 04/09/2007    Priority: Medium  . Palpitations 02/11/2013    Priority: Low  . OSTEOARTHRITIS, WRIST, RIGHT 08/05/2010    Priority: Low  . ACNE ROSACEA 06/27/2009    Priority: Low  . ESOPHAGEAL STRICTURE 04/27/2009    Priority: Low  . NECK PAIN 09/14/2008    Priority: Low  . NEUROPATHY, IDIOPATHIC PERIPHERAL NEC 08/11/2007    Priority: Low  . Irritable bowel syndrome 08/11/2007    Priority: Low  . ALLERGIC RHINITIS 04/09/2007    Priority: Low   Past Surgical History  Procedure Laterality Date  . Hernia repair    . Lamenectomy    . Lumbar fusion    . Tonsillectomy    . Incision and drainage wound with foreign body removal Left 12/20/2013    Procedure: INCISION AND DRAINAGE LEFT INDEX FINGER;  Surgeon: Tennis Must, MD;  Location:  [facility];  Service: Orthopedics;  Laterality: Left;  . Esophagogastroduodenoscopy    . Colonoscopy    . Flexible sigmoidoscopy    . Varicose vein surgery Left   . Cardiac catheterization    . Finger surgery      cut off end of finger  . Coronary artery bypass graft N/A 05/19/2014    Procedure: CORONARY ARTERY BYPASS GRAFTING (CABG);  Surgeon: Rexene Alberts, MD;  Location: [facility];  Service: Open Heart Surgery;  Laterality: N/A;  Times 4 using left internal mammary artery and endoscopically harvested right  saphenous vein  . Intraoperative transesophageal echocardiogram N/A 05/19/2014    Procedure: INTRAOPERATIVE TRANSESOPHAGEAL ECHOCARDIOGRAM;  Surgeon: Rexene Alberts, MD;  Location: [facility];  Service: Open Heart Surgery;  Laterality: N/A;    Family History  Problem Relation Age of Onset  . Hernia Mother   . Heart disease Father     smoker  . COPD Father   . Colon cancer Neg Hx     Medications- reviewed and updated Current Outpatient Prescriptions  Medication Sig Dispense Refill  . acetaminophen (TYLENOL) 500 MG tablet Take 500 mg by mouth daily as needed (for pain).       [name] Kitchen aspirin EC 325 MG EC tablet Take 1 tablet (325 mg total) by mouth daily.      . cetirizine (ZYRTEC) 10 MG tablet Take 10 mg by mouth daily as needed for allergies.       . cyanocobalamin (,VITAMIN B-12,) 1000 MCG/ML injection Inject 1.53ml every 3 weeks  10 mL  3  . diphenoxylate-atropine (LOMOTIL) 2.5-0.025 MG per tablet Take 1 tablet by mouth 4 (four) times daily as needed for diarrhea or loose stools.      . fish oil-omega-3 fatty acids 1000 MG capsule Take 1 g by mouth daily. 1200 mg      . fluticasone (FLONASE) 50 MCG/ACT nasal spray Place 1-2 sprays into both nostrils daily as needed for allergies.       . nadolol (CORGARD) 20 MG tablet Take 1/2 tablet  Once dailyHOLD UNTIL FURTHER NOTIFIED BY CARDIOLOGY      . NITROSTAT 0.4 MG SL tablet Place 0.4 mg under the tongue every 5 (five) minutes as needed.       . temazepam (RESTORIL) 15 MG capsule Take 15 mg by mouth at bedtime as needed for sleep.      . traMADol (ULTRAM) 50 MG tablet Take 1-2 tablets (50-100 mg total) by mouth every 4 (four) hours as needed for severe pain.  30 tablet  0  . Multiple Vitamin (MULTIVITAMIN WITH MINERALS) TABS tablet Take 1 tablet by mouth daily.      . pravastatin (PRAVACHOL) 40 MG tablet Take 1 tablet (40 mg total) by mouth daily. Take 1/2 tab for first week then full tab.  30 tablet  5  . tamsulosin (FLOMAX) 0.4 MG CAPS capsule Take  0.4 mg by mouth daily after breakfast. Urination problem       No current facility-administered medications for this visit.    Allergies-reviewed and updated Allergies  Allergen Reactions  . Ciprofloxacin Swelling  . Lipitor [Atorvastatin]     REACTION: nausea and blurred vision  . Mycophenolate Mofetil     REACTION: unspecified  . Rosuvastatin     Unknown   . Amoxicillin Rash  . Penicillins Rash    History   Social History  . Marital Status: Married    Spouse Name: N/A    Number of Children: 4  .  Years of Education: N/A   Occupational History  . retired     from [employer]  . Smoking status: Former Smoker -- 0.50 packs/day for 42 years    Types: Cigarettes    Quit date: 11/17/2001  . Smokeless tobacco: Never Used  . Alcohol Use: No  . Drug Use: No  . Sexual Activity: Yes   Other Topics Concern  . Not on file   Social History Narrative   Married 35 years (2nd marriage). 2 kids from previous marriage. 2 stepchildren. 8 grandkids.       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 Oropharynx normal, mild mucus membrane pallor Neck: no thyromegaly CV: RRR no murmurs rubs or gallops, midline chest scar noted Lungs: CTAB no crackles, wheeze, rhonchi Abdomen: soft/nontender/nondistended/normal bowel sounds. Ext: no edema Skin: warm, dry, no rash  Assessment/Plan:  HYPERLIPIDEMIA Pending lipids, With history of recent CABG/CAD, rx for pravastatin 40mg  sent in-consider further titration if tolerable. History of allergy to crestor and lipitor unfortunately.   Coronary artery disease Continue aspirin. Start pravastatin (can  stop red yeast rice). Has cards follow up next week.   HYPERTENSION Poorly controlled. Restart half pill nadolol and follow up with cardiology next week  Anemia Check CBC today    Orders Placed This Encounter  Procedures  . Flu Vaccine QUAD 36+ mos PF IM (Fluarix Quad PF)  . LDL cholesterol, direct    [facility]  . Comprehensive metabolic panel    [facility]  . CBC    [facility]  . Ferritin  . Iron and TIBC    Meds ordered this encounter  Medications  . cyanocobalamin ((VITAMIN B-12)) injection 1,000 mcg    Sig:   . pravastatin (PRAVACHOL) 40 MG tablet    Sig: Take 1 tablet (40 mg total) by mouth daily. Take 1/2 tab for first week then full tab.    Dispense:  30 tablet    Refill:  5

## 2014-07-20 NOTE — Assessment & Plan Note (Signed)
Continue aspirin. Start pravastatin (can stop red yeast rice). Has cards follow up next week.

## 2014-07-21 LAB — IRON AND TIBC
%SAT: 11 % — ABNORMAL LOW (ref 20–55)
Iron: 34 ug/dL — ABNORMAL LOW (ref 42–165)
TIBC: 322 ug/dL (ref 215–435)
UIBC: 288 ug/dL (ref 125–400)

## 2014-07-26 ENCOUNTER — Encounter: Payer: Self-pay | Admitting: Interventional Cardiology

## 2014-07-26 ENCOUNTER — Ambulatory Visit (INDEPENDENT_AMBULATORY_CARE_PROVIDER_SITE_OTHER): Payer: Medicare Other | Admitting: Interventional Cardiology

## 2014-07-26 VITALS — BP 126/84 | HR 60 | Ht 72.0 in | Wt 191.0 lb

## 2014-07-26 DIAGNOSIS — E785 Hyperlipidemia, unspecified: Secondary | ICD-10-CM

## 2014-07-26 DIAGNOSIS — M542 Cervicalgia: Secondary | ICD-10-CM

## 2014-07-26 DIAGNOSIS — I10 Essential (primary) hypertension: Secondary | ICD-10-CM

## 2014-07-26 DIAGNOSIS — Z951 Presence of aortocoronary bypass graft: Secondary | ICD-10-CM

## 2014-07-26 MED ORDER — TELMISARTAN 80 MG PO TABS: 80.0000 mg | ORAL_TABLET | Freq: Every day | ORAL | Status: DC

## 2014-07-26 MED ORDER — INDAPAMIDE 1.25 MG PO TABS: 1.2500 mg | ORAL_TABLET | Freq: Every day | ORAL | Status: DC

## 2014-07-26 NOTE — Patient Instructions (Signed)
Your physician has recommended you make the following change in your medication:  1) RESTART Micardis 80mg  daily 2) RESTART Indapamide 1.25mg  daily  Your physician recommends that you schedule a follow-up appointment in: 3 months

## 2014-07-26 NOTE — Progress Notes (Signed)
Patient ID: Timothy Gallagher, male   DOB: 1936/10/02, 78 y.o.   MRN: [id number]    [address], [address] [place], [place]  [address] Phone: [phone] Fax:  [phone]  Date:  07/26/2014   ID:  Timothy Gallagher, DOB 1936-01-26, MRN [id number]  PCP:  [name], MD   ASSESSMENT:  1. Coronary artery disease with recent bypass surgery and no recurrence of angina since the operation 2 months ago. 2. Persisting neck discomfort since surgery. The patient has a history of prior neck surgery. 3. Hypertension is poorly controlled 4. Hyperlipidemia, currently on therapy.  PLAN:  1. We need to resume an aggressive regimen for blood pressure control. I'll restart the Lozol and myocarditis at prior doses. He is taking only 10 mg of [place] which we will not change. 2. 2-3 month followup 3. If his neck continues to get in trouble, I have instructed him to see his primary care physician and consider having evaluation with x-rays and or imaging   SUBJECTIVE: Timothy Gallagher is a 78 y.o. male who is doing well from cardiac standpoint. His blood pressures have been running high. He is a long-standing history of hypertension but had most of his medications discontinued at the time of surgery. He is now taking only 10 mg of Nadalol per day. He denies orthopnea, PND, edema, angina, syncope, and strokelike symptoms. He complains of persisting neck discomfort. There is a prior history of neck surgery per   Wt Readings from Last 3 Encounters:  07/26/14 191 lb (86.637 kg)  07/20/14 192 lb (87.091 kg)  07/03/14 191 lb 8 oz (86.864 kg)     Past Medical History  Diagnosis Date  . ACNE ROSACEA 06/27/2009  . ACTINIC KERATOSIS, HEAD 04/18/2009  . Acute maxillary sinusitis 05/14/2010  . ALLERGIC RHINITIS 04/09/2007  . B12 DEFICIENCY 06/07/2007  . BACK PAIN WITH RADICULOPATHY 04/24/2008  . CHEST WALL PAIN, ACUTE 06/15/2009  . Chronic maxillary sinusitis 05/29/2008  . COLITIS 04/27/2009  . COLONIC POLYPS,  HX OF 04/27/2009    tubular adenomas  . DERMATITIS, ATOPIC 10/12/2007  . DIVERTICULOSIS, COLON 04/27/2009  . ECCHYMOSES, SPONTANEOUS 06/27/2008  . Elevated sedimentation rate 05/02/2009  . ESOPHAGEAL STRICTURE 04/27/2009  . GASTRITIS, CHRONIC 04/27/2009  . HYPERLIPIDEMIA 03/13/2008  . HYPERTENSION 04/09/2007  . Irritable bowel syndrome 08/11/2007  . KIDNEY DISEASE 04/09/2007  . NECK PAIN 09/14/2008  . NEUROPATHY, IDIOPATHIC PERIPHERAL NEC 08/11/2007  . OSTEOARTHRITIS, WRIST, RIGHT 08/05/2010  . Cancer     skin  . Coronary artery disease 05/12/2014    Cath 05/12/2014 w/ severe 3-vessel CAD and preserved LV function, EF 55%  . S/P CABG x 4 05/19/2014    LIMA to LAD, SVG to diag, SVG to OM, SVG to PDA, EVH via right thigh and leg  . Postoperative delirium 05/20/2014    Current Outpatient Prescriptions  Medication Sig Dispense Refill  . acetaminophen (TYLENOL) 500 MG tablet Take 500 mg by mouth daily as needed (for pain).       [name] Kitchen aspirin EC 325 MG EC tablet Take 1 tablet (325 mg total) by mouth daily.      . cetirizine (ZYRTEC) 10 MG tablet Take 10 mg by mouth daily as needed for allergies.       . cyanocobalamin (,VITAMIN B-12,) 1000 MCG/ML injection Inject 1.35ml every 3 weeks  10 mL  3  . diphenoxylate-atropine (LOMOTIL) 2.5-0.025 MG per tablet Take 1 tablet by mouth 4 (four) times daily as needed for  diarrhea or loose stools.      . fish oil-omega-3 fatty acids 1000 MG capsule Take 1 g by mouth daily. 1200 mg      . fluticasone (FLONASE) 50 MCG/ACT nasal spray Place 1-2 sprays into both nostrils daily as needed for allergies.       . Multiple Vitamin (MULTIVITAMIN WITH MINERALS) TABS tablet Take 1 tablet by mouth daily.      . nadolol (CORGARD) 20 MG tablet Take 1/2 tablet  Once dailyHOLD UNTIL FURTHER NOTIFIED BY CARDIOLOGY      . NITROSTAT 0.4 MG SL tablet Place 0.4 mg under the tongue every 5 (five) minutes as needed.       . pravastatin (PRAVACHOL) 40 MG tablet Take 1 tablet (40 mg total) by  mouth daily. Take 1/2 tab for first week then full tab.  30 tablet  5  . tamsulosin (FLOMAX) 0.4 MG CAPS capsule Take 0.4 mg by mouth daily after breakfast. Urination problem      . temazepam (RESTORIL) 15 MG capsule Take 15 mg by mouth at bedtime as needed for sleep.      . traMADol (ULTRAM) 50 MG tablet Take 1-2 tablets (50-100 mg total) by mouth every 4 (four) hours as needed for severe pain.  30 tablet  0   No current facility-administered medications for this visit.    Allergies:    Allergies  Allergen Reactions  . Ciprofloxacin Swelling  . Lipitor [Atorvastatin]     REACTION: nausea and blurred vision  . Mycophenolate Mofetil     REACTION: unspecified  . Rosuvastatin     Unknown   . Amoxicillin Rash  . Penicillins Rash    Social History:  The patient  reports that he quit smoking about 12 years ago. His smoking use included Cigarettes. He has a 21 pack-year smoking history. He has never used smokeless tobacco. He reports that he does not drink alcohol or use illicit drugs.   ROS:  Please see the history of present illness.   No claudication. No palpitations or syncope. Appetite is been stable. Denies chills or fever.   All other systems reviewed and negative.   OBJECTIVE: VS:  BP 126/84  Pulse 60  Ht 6' (1.829 m)  Wt 191 lb (86.637 kg)  BMI 25.90 kg/m2 180/70 mmHg Well nourished, well developed, in no acute distress, well-developed well-nourished HEENT: normal Neck: JVD flat. Carotid bruit absent  Cardiac:  normal S1, S2; RRR; no murmur Lungs:  clear to auscultation bilaterally, no wheezing, rhonchi or rales. Sternal incision is well-healed. Abd: soft, nontender, no hepatomegaly Ext: Edema absent. Pulses 2+ and symmetric Skin: warm and dry Neuro:  CNs 2-12 intact, no focal abnormalities noted  EKG:  Not repeated       Signed, Illene Labrador III, MD 07/26/2014 11:37 AM

## 2014-08-02 ENCOUNTER — Ambulatory Visit: Payer: Medicare Other | Admitting: Internal Medicine

## 2014-08-04 ENCOUNTER — Ambulatory Visit: Payer: Medicare Other | Admitting: Internal Medicine

## 2014-08-06 ENCOUNTER — Other Ambulatory Visit: Payer: Self-pay | Admitting: Internal Medicine

## 2014-08-10 ENCOUNTER — Ambulatory Visit (INDEPENDENT_AMBULATORY_CARE_PROVIDER_SITE_OTHER): Payer: Medicare Other | Admitting: Family Medicine

## 2014-08-10 ENCOUNTER — Encounter: Payer: Self-pay | Admitting: Family Medicine

## 2014-08-10 ENCOUNTER — Ambulatory Visit: Payer: Medicare Other | Admitting: Family Medicine

## 2014-08-10 ENCOUNTER — Ambulatory Visit: Payer: Medicare Other | Admitting: *Deleted

## 2014-08-10 VITALS — BP 134/80 | HR 64 | Temp 98.0°F | Wt 191.0 lb

## 2014-08-10 DIAGNOSIS — E538 Deficiency of other specified B group vitamins: Secondary | ICD-10-CM

## 2014-08-10 DIAGNOSIS — M542 Cervicalgia: Secondary | ICD-10-CM

## 2014-08-10 DIAGNOSIS — I10 Essential (primary) hypertension: Secondary | ICD-10-CM

## 2014-08-10 MED ORDER — PREDNISONE 20 MG PO TABS: 20.0000 mg | ORAL_TABLET | Freq: Every day | ORAL | Status: DC

## 2014-08-10 MED ORDER — TEMAZEPAM 15 MG PO CAPS: 15.0000 mg | ORAL_CAPSULE | Freq: Every evening | ORAL | Status: DC | PRN

## 2014-08-10 MED ORDER — CYANOCOBALAMIN 1000 MCG/ML IJ SOLN
1000.0000 ug | Freq: Once | INTRAMUSCULAR | Status: AC
  Administered 2014-08-10: 1000 ug via INTRAMUSCULAR

## 2014-08-10 NOTE — Assessment & Plan Note (Addendum)
Pain now x 3 months with only mild improvement. Strongly suspect this is MSK. No red flags other than duration. Discussed x-rays and referral to sports medicine but patient would like to trial antiinflammatory first. Prefers to avoid nsaids. Trial prednisone x 7 days. If no improvement, will try previous plan. During this time, can use tylenol or tramadol as per avs.

## 2014-08-10 NOTE — Patient Instructions (Signed)
1. Try Prednisone for 7 days. It is fine to take tylenol 500mg  up to every 4 hours while taking this. If still need additional pain control, can take 1 tramadol every 4 hours.  2. Call me in 7-14 days to let me know if it helped 3. If it did not help, we will get x-rays of your neck.  4. Depending on what x-rays show, suspect we will send you to Dr. [name] of Sports medicine  B12 injection today  Refilled sleeping medicine temazepam

## 2014-08-10 NOTE — Assessment & Plan Note (Signed)
Reasonable control with SBP 134 and DBP 80. Did not tolerate Lozol. Continue arb and nadolol. F/u in December. Will also recheck LDL at that visit.

## 2014-08-10 NOTE — Addendum Note (Signed)
Addended by: Townsend Roger D on: 08/10/2014 04:30 PM   Modules accepted: Orders

## 2014-08-10 NOTE — Progress Notes (Signed)
[name], MD Phone: [phone]  Subjective:   Timothy Gallagher is a 78 y.o. year old very pleasant male patient who presents with the following:  Neck Pain Surgery on July 3rd for heart bypass. Since that time, he has had continued neck pain rated up to 10/10. Despite waiting 3 months, pain has only improved slightly but still hast times where it is 10/10. Seems to be worse at night when he lays down. If he forces his head down, pain seems to ease off. Massage helps to some degree. Heat and hot water in shower help. Uses tramadol and tylenol which eases pain off but  ROS- no weakness in arms or legs, no fecal or urinary incontinence. No numbness or tingling running down into arms.   Hypertension-good control  BP Readings from Last 3 Encounters:  08/10/14 134/80  07/26/14 126/84  07/20/14 158/94  Home BP monitoring-no Compliant with medications-stated could not tolerate Lozol as he was simply exhaused. Doing fine with ARB and nadolol. ROS-Denies any CP, HA, SOB, blurry vision, LE edema, transient weakness, orthopnea, PND.   Past Medical History- Patient Active Problem List   Diagnosis Date Noted  . S/P CABG x 4 05/19/2014    Priority: High  . Coronary artery disease 05/12/2014    Priority: High  . Anemia 07/20/2014    Priority: Medium  . BPH (benign prostatic hyperplasia) 12/05/2013    Priority: Medium  . ACTINIC KERATOSIS, HEAD 04/18/2009    Priority: Medium  . HYPERLIPIDEMIA 03/13/2008    Priority: Medium  . B12 DEFICIENCY 06/07/2007    Priority: Medium  . HYPERTENSION 04/09/2007    Priority: Medium  . Palpitations 02/11/2013    Priority: Low  . OSTEOARTHRITIS, WRIST, RIGHT 08/05/2010    Priority: Low  . ACNE ROSACEA 06/27/2009    Priority: Low  . ESOPHAGEAL STRICTURE 04/27/2009    Priority: Low  . NECK PAIN 09/14/2008    Priority: Low  . NEUROPATHY, IDIOPATHIC PERIPHERAL NEC 08/11/2007    Priority: Low  . Irritable bowel syndrome 08/11/2007    Priority:  Low  . ALLERGIC RHINITIS 04/09/2007    Priority: Low   Medications- reviewed and updated Current Outpatient Prescriptions  Medication Sig Dispense Refill  . acetaminophen (TYLENOL) 500 MG tablet Take 500 mg by mouth daily as needed (for pain).       [name] Kitchen aspirin EC 325 MG EC tablet Take 1 tablet (325 mg total) by mouth daily.      . cetirizine (ZYRTEC) 10 MG tablet Take 10 mg by mouth daily as needed for allergies.       . cyanocobalamin (,VITAMIN B-12,) 1000 MCG/ML injection Inject 1.30ml every 3 weeks  10 mL  3  . diphenoxylate-atropine (LOMOTIL) 2.5-0.025 MG per tablet Take 1 tablet by mouth 4 (four) times daily as needed for diarrhea or loose stools.      . fish oil-omega-3 fatty acids 1000 MG capsule Take 1 g by mouth daily. 1200 mg      . fluticasone (FLONASE) 50 MCG/ACT nasal spray Place 1-2 sprays into both nostrils daily as needed for allergies.       . Multiple Vitamin (MULTIVITAMIN WITH MINERALS) TABS tablet Take 1 tablet by mouth daily.      . nadolol (CORGARD) 20 MG tablet Take 1/2 tablet  Once dailyHOLD UNTIL FURTHER NOTIFIED BY CARDIOLOGY      . pravastatin (PRAVACHOL) 40 MG tablet Take 1 tablet (40 mg total) by mouth daily. Take 1/2 tab for first week then  full tab.  30 tablet  5  . tamsulosin (FLOMAX) 0.4 MG CAPS capsule Take 0.4 mg by mouth daily after breakfast. Urination problem      . temazepam (RESTORIL) 15 MG capsule Take 1 capsule (15 mg total) by mouth at bedtime as needed for sleep.  30 capsule  3  . fluticasone (FLONASE) 50 MCG/ACT nasal spray PLACE 2 SPRAYS INTO THE NOSE DAILY.  16 g  1  . NITROSTAT 0.4 MG SL tablet Place 0.4 mg under the tongue every 5 (five) minutes as needed.       . predniSONE (DELTASONE) 20 MG tablet Take 1 tablet (20 mg total) by mouth daily with breakfast.  7 tablet  0  . telmisartan (MICARDIS) 80 MG tablet Take 1 tablet (80 mg total) by mouth daily.      . traMADol (ULTRAM) 50 MG tablet Take 1-2 tablets (50-100 mg total) by mouth every 4 (four)  hours as needed for severe pain.  30 tablet  0   No current facility-administered medications for this visit.    Objective: BP 134/80  Pulse 64  Temp(Src) 98 F (36.7 C)  Wt 191 lb (86.637 kg) Gen: NAD, resting comfortably Neck: full ROM but pain with turning. Pain both in trapezius and sternocleidomastoid. Pain improves with massage in this area.  Neuro: 5/5 strength in upper and lower extremities. 2+ reflexes in arm.  Skin: no rash  Assessment/Plan:  HYPERTENSION Reasonable control with SBP 134 and DBP 80. Did not tolerate Lozol. Continue arb and nadolol. F/u in December. Will also recheck LDL at that visit.   NECK PAIN Pain now x 3 months with only mild improvement. Strongly suspect this is MSK. No red flags other than duration. Discussed x-rays and referral to sports medicine but patient would like to trial antiinflammatory first. Prefers to avoid nsaids. Trial prednisone x 7 days. If no improvement, will try previous plan. During this time, can use tylenol or tramadol as per avs.    Meds ordered this encounter  Medications  . temazepam (RESTORIL) 15 MG capsule    Sig: Take 1 capsule (15 mg total) by mouth at bedtime as needed for sleep.    Dispense:  30 capsule    Refill:  3  . predniSONE (DELTASONE) 20 MG tablet    Sig: Take 1 tablet (20 mg total) by mouth daily with breakfast.    Dispense:  7 tablet    Refill:  0

## 2014-08-21 ENCOUNTER — Telehealth: Payer: Self-pay | Admitting: Family Medicine

## 2014-08-21 DIAGNOSIS — G8929 Other chronic pain: Secondary | ICD-10-CM

## 2014-08-21 DIAGNOSIS — M542 Cervicalgia: Secondary | ICD-10-CM

## 2014-08-21 NOTE — Telephone Encounter (Signed)
Dr. Hunter please see below 

## 2014-08-21 NOTE — Telephone Encounter (Signed)
Yes, let's get x-ray. Likely will refer to sports medicine. Please ask patient to go to [facility] to get x-ray which I ordered at this time

## 2014-08-21 NOTE — Telephone Encounter (Signed)
Pt.notified

## 2014-08-21 NOTE — Telephone Encounter (Signed)
Pt said his neck is better but is still having some discomfort. He has been taking prednisone for 7 days. Pt is questioning as to rather or nor he should have an xray that Dr Yong Channel suggested

## 2014-08-22 ENCOUNTER — Ambulatory Visit (INDEPENDENT_AMBULATORY_CARE_PROVIDER_SITE_OTHER)
Admission: RE | Admit: 2014-08-22 | Discharge: 2014-08-22 | Disposition: A | Discharge status: Home / Self Care | Payer: Medicare Other | Source: Ambulatory Visit | Attending: Family Medicine | Admitting: Family Medicine

## 2014-08-22 DIAGNOSIS — G8929 Other chronic pain: Secondary | ICD-10-CM

## 2014-08-22 DIAGNOSIS — M542 Cervicalgia: Secondary | ICD-10-CM

## 2014-08-22 NOTE — Telephone Encounter (Signed)
Completed by Bevelyn Ngo

## 2014-08-31 ENCOUNTER — Ambulatory Visit (INDEPENDENT_AMBULATORY_CARE_PROVIDER_SITE_OTHER): Payer: Medicare Other | Admitting: *Deleted

## 2014-08-31 DIAGNOSIS — E538 Deficiency of other specified B group vitamins: Secondary | ICD-10-CM

## 2014-08-31 MED ORDER — CYANOCOBALAMIN 1000 MCG/ML IJ SOLN
1000.0000 ug | Freq: Once | INTRAMUSCULAR | Status: AC
  Administered 2014-08-31: 1000 ug via INTRAMUSCULAR

## 2014-09-01 ENCOUNTER — Other Ambulatory Visit: Payer: Medicare Other

## 2014-09-04 ENCOUNTER — Encounter: Payer: Self-pay | Admitting: Thoracic Surgery (Cardiothoracic Vascular Surgery)

## 2014-09-04 ENCOUNTER — Ambulatory Visit (INDEPENDENT_AMBULATORY_CARE_PROVIDER_SITE_OTHER): Payer: Medicare Other | Admitting: Thoracic Surgery (Cardiothoracic Vascular Surgery)

## 2014-09-04 VITALS — BP 160/92 | HR 59 | Ht 72.0 in | Wt 191.0 lb

## 2014-09-04 DIAGNOSIS — Z951 Presence of aortocoronary bypass graft: Secondary | ICD-10-CM

## 2014-09-04 DIAGNOSIS — I25119 Atherosclerotic heart disease of native coronary artery with unspecified angina pectoris: Secondary | ICD-10-CM

## 2014-09-04 NOTE — Progress Notes (Signed)
[address]       ,[place] [address]             [phone]     CARDIOTHORACIC SURGERY OFFICE NOTE  Referring Provider is Sinclair Grooms, MD PCP is [name], MD   HPI:  Patient returns to the office for routine followup status post coronary artery bypass grafting x3 on 05/19/2014 for severe three-vessel coronary artery disease with normal LV systolic function and exertional shortness of breath consistent with angina pectoris.  He was last seen here in our office for routine followup on 07/03/2014. Since then he has continued to do well. He has had problems with neck pain for which he has been evaluated by [name]. He was treated with a brief course of oral steroids and symptoms have improved.  He otherwise is doing remarkably well. He denies any symptoms of exertional chest pain or shortness of breath. He states that his exercise tolerance is considerably better than it was prior to surgery. His activity level is quite good. He reports no other problems or complaints.    Current Outpatient Prescriptions  Medication Sig Dispense Refill  . aspirin EC 325 MG EC tablet Take 1 tablet (325 mg total) by mouth daily.      . cetirizine (ZYRTEC) 10 MG tablet Take 10 mg by mouth daily as needed for allergies.       . cyanocobalamin (,VITAMIN B-12,) 1000 MCG/ML injection Inject 1.97ml every 3 weeks  10 mL  3  . diphenoxylate-atropine (LOMOTIL) 2.5-0.025 MG per tablet Take 1 tablet by mouth 4 (four) times daily as needed for diarrhea or loose stools.      . fish oil-omega-3 fatty acids 1000 MG capsule Take 1 g by mouth daily. 1200 mg      . fluticasone (FLONASE) 50 MCG/ACT nasal spray Place 1-2 sprays into both nostrils daily as needed for allergies.       . fluticasone (FLONASE) 50 MCG/ACT nasal spray PLACE 2 SPRAYS INTO THE NOSE DAILY.  16 g  1  . Multiple Vitamin (MULTIVITAMIN WITH MINERALS) TABS tablet Take 1 tablet by mouth daily.      . nadolol (CORGARD)  20 MG tablet Take 1/2 tablet  Once dailyHOLD UNTIL FURTHER NOTIFIED BY CARDIOLOGY      . NITROSTAT 0.4 MG SL tablet Place 0.4 mg under the tongue every 5 (five) minutes as needed.       . pravastatin (PRAVACHOL) 40 MG tablet Take 1 tablet (40 mg total) by mouth daily. Take 1/2 tab for first week then full tab.  30 tablet  5  . predniSONE (DELTASONE) 20 MG tablet Take 1 tablet (20 mg total) by mouth daily with breakfast.  7 tablet  0  . tamsulosin (FLOMAX) 0.4 MG CAPS capsule Take 0.4 mg by mouth daily after breakfast. Urination problem      . telmisartan (MICARDIS) 80 MG tablet Take 1 tablet (80 mg total) by mouth daily.      . temazepam (RESTORIL) 15 MG capsule Take 1 capsule (15 mg total) by mouth at bedtime as needed for sleep.  30 capsule  3  . acetaminophen (TYLENOL) 500 MG tablet Take 500 mg by mouth daily as needed (for pain).       . traMADol (ULTRAM) 50 MG tablet Take 1-2 tablets (50-100 mg total) by mouth every 4 (four) hours as needed for severe pain.  30 tablet  0   No current facility-administered  medications for this visit.      Physical Exam:   BP 160/92  Pulse 59  Ht 6' (1.829 m)  Wt 191 lb (86.637 kg)  BMI 25.90 kg/m2  SpO2 98%  General:  Well-appearing  Chest:   Clear  CV:   Regular rate and rhythm without murmur  Incisions:  Completely healed, sternum is stable  Abdomen:  Soft nontender  Extremities:  Warm and well-perfused  Diagnostic Tests:  n/a   Impression:  Patient is doing well approximately 3 months status post coronary artery bypass grafting.  Plan:  I have encouraged patient to continue to increase his physical activity without any particular limitations at this time. He will continue to followup with Dr. [name] and [name] for long-term attention to his medical conditions including long-standing hypertension. He will return for routine followup in July, proximally 1 year status post coronary artery bypass grafting.  I spent in excess of 15  minutes during the conduct of this office consultation and >50% of this time involved direct face-to-face encounter with the patient for counseling and/or coordination of their care.   Valentina Gu. [name], MD 09/04/2014 2:19 PM

## 2014-09-21 ENCOUNTER — Ambulatory Visit: Payer: Medicare Other | Admitting: *Deleted

## 2014-09-22 ENCOUNTER — Ambulatory Visit (INDEPENDENT_AMBULATORY_CARE_PROVIDER_SITE_OTHER): Payer: Medicare Other | Admitting: Family Medicine

## 2014-09-22 ENCOUNTER — Encounter: Payer: Self-pay | Admitting: Family Medicine

## 2014-09-22 VITALS — BP 124/78 | Temp 97.9°F | Wt 194.0 lb

## 2014-09-22 DIAGNOSIS — E785 Hyperlipidemia, unspecified: Secondary | ICD-10-CM

## 2014-09-22 DIAGNOSIS — M542 Cervicalgia: Secondary | ICD-10-CM

## 2014-09-22 DIAGNOSIS — E538 Deficiency of other specified B group vitamins: Secondary | ICD-10-CM

## 2014-09-22 DIAGNOSIS — N400 Enlarged prostate without lower urinary tract symptoms: Secondary | ICD-10-CM

## 2014-09-22 DIAGNOSIS — I10 Essential (primary) hypertension: Secondary | ICD-10-CM

## 2014-09-22 MED ORDER — CYANOCOBALAMIN 1000 MCG/ML IJ SOLN
1000.0000 ug | Freq: Once | INTRAMUSCULAR | Status: AC
  Administered 2014-09-22: 1000 ug via INTRAMUSCULAR

## 2014-09-22 NOTE — Assessment & Plan Note (Signed)
Symptoms are nearly resolved after course of prednisone. No longer needs tramadol. Monitor for recurrence.

## 2014-09-22 NOTE — Assessment & Plan Note (Addendum)
Suspect improved control on pravastatin. Unfortunately he felt mildly nauseous, weak and low energy on full dose. Trial pravastatin 20 mg. Follow-up in 3 months. Check LDL at that time.

## 2014-09-22 NOTE — Progress Notes (Signed)
[name], MD Phone: [phone]  Subjective:   Timothy Gallagher is a 78 y.o. year old very pleasant male patient who presents with the following:  Neck Pain-improved Overall improved with course of prednisone. As long as stays in pretty normal position, pretty much no pain. If he really bends he will get some pain up to 5/10.  ROS- no paresthesias, or weakness  Hyperlipidemia-poor control previously, recently started on statin  Lab Results  Component Value Date   LDLCALC 128* 09/28/2012  On statin: Mild nausea on pravastatin. Mildly weak and low energy as well ROS- no chest pain or shortness of breath. No myalgias  BPH-good control even off of medication Stopped flomax a few weeks ago and still peeing just once a night. No frequent urination. Patient is hopeful to decrease meds.  ROS- no penile discharge. No dysuria.   Hypertension-good control  BP Readings from Last 3 Encounters:  09/22/14 124/78  09/04/14 160/92  08/10/14 134/80  Off indapamide (took himself off) and no increased swelling, shortness of breath, chest pain Compliant with medications-yes without side effects (other than stopping indapamide) ROS-Denies any HA, blurry vision, chest pain.   Past Medical History- Patient Active Problem List   Diagnosis Date Noted  . S/P CABG x 4 05/19/2014    Priority: High  . Coronary artery disease 05/12/2014    Priority: High  . Anemia 07/20/2014    Priority: Medium  . BPH (benign prostatic hyperplasia) 12/05/2013    Priority: Medium  . ACTINIC KERATOSIS, HEAD 04/18/2009    Priority: Medium  . Hyperlipidemia 03/13/2008    Priority: Medium  . B12 DEFICIENCY 06/07/2007    Priority: Medium  . Essential hypertension 04/09/2007    Priority: Medium  . Palpitations 02/11/2013    Priority: Low  . OSTEOARTHRITIS, WRIST, RIGHT 08/05/2010    Priority: Low  . ACNE ROSACEA 06/27/2009    Priority: Low  . ESOPHAGEAL STRICTURE 04/27/2009    Priority: Low  . NECK PAIN  09/14/2008    Priority: Low  . NEUROPATHY, IDIOPATHIC PERIPHERAL NEC 08/11/2007    Priority: Low  . Irritable bowel syndrome 08/11/2007    Priority: Low  . ALLERGIC RHINITIS 04/09/2007    Priority: Low   Medications- reviewed and updated Current Outpatient Prescriptions  Medication Sig Dispense Refill  . aspirin EC 325 MG EC tablet Take 1 tablet (325 mg total) by mouth daily.    . cetirizine (ZYRTEC) 10 MG tablet Take 10 mg by mouth daily as needed for allergies.     . cyanocobalamin (,VITAMIN B-12,) 1000 MCG/ML injection Inject 1.54ml every 3 weeks 10 mL 3  . diphenoxylate-atropine (LOMOTIL) 2.5-0.025 MG per tablet Take 1 tablet by mouth 4 (four) times daily as needed for diarrhea or loose stools.    . Multiple Vitamin (MULTIVITAMIN WITH MINERALS) TABS tablet Take 1 tablet by mouth daily.    . nadolol (CORGARD) 20 MG tablet Take 1/2 tablet  Once dailyHOLD UNTIL FURTHER NOTIFIED BY CARDIOLOGY    . pravastatin (PRAVACHOL) 40 MG tablet Take 1 tablet (40 mg total) by mouth daily. Take 1/2 tab for first week then full tab. (Patient taking differently: Take 40 mg by mouth daily. Take 1/2 tab) 30 tablet 5  . telmisartan (MICARDIS) 80 MG tablet Take 1 tablet (80 mg total) by mouth daily.    . temazepam (RESTORIL) 15 MG capsule Take 1 capsule (15 mg total) by mouth at bedtime as needed for sleep. 30 capsule 3  . acetaminophen (TYLENOL) 500 MG  tablet Take 500 mg by mouth daily as needed (for pain).     . fish oil-omega-3 fatty acids 1000 MG capsule Take 1 g by mouth daily. 1200 mg    . fluticasone (FLONASE) 50 MCG/ACT nasal spray Place 1-2 sprays into both nostrils daily as needed for allergies.     [name] Kitchen NITROSTAT 0.4 MG SL tablet Place 0.4 mg under the tongue every 5 (five) minutes as needed.      No current facility-administered medications for this visit.    Objective: BP 124/78 mmHg  Temp(Src) 97.9 F (36.6 C)  Wt 194 lb (87.998 kg) Gen: NAD, resting comfortably in chair wearing hat CV: RRR  no murmurs rubs or gallops Lungs: CTAB no crackles, wheeze, rhonchi Abdomen: soft/nontender/nondistended/normal bowel sounds.  Ext: no edema Skin: warm, dry, no rash on exposed arms Neuro: grossly normal, moves all extremities   Assessment/Plan:  Essential hypertension Doing well on telmisartan and nadolol. Self titrated off indapamide. No signs/symptoms heart failure. Continue current medications. Can monitor BP at home with 3 mo fu.   Hyperlipidemia Suspect improved control on pravastatin. Unfortunately he felt mildly nauseous, weak and low energy on full dose. Trial pravastatin 20 mg. Follow-up in 3 months. Check LDL at that time.  BPH (benign prostatic hyperplasia) Reasonable control off of Flomax. Continue off medication. Continue to monitor.  NECK PAIN Symptoms are nearly resolved after course of prednisone. No longer needs tramadol. Monitor for recurrence.  3 mo fu

## 2014-09-22 NOTE — Patient Instructions (Addendum)
Take 1/2 tab at dinnertime for your cholesterol-pravastatin. Let me know how you are feeling in 1-2 weeks. We may further reduce if needed.   Stop flomax. If you start having trouble urinating or peeing too much at night come see me and we can consider restarting.   Blood pressure looks great off indapamide. If you had swelling in your legs, sleepingon more pillows, shortness of breath, come see [place]. If blood pressure ever gets abov 140/90 come see me as well.

## 2014-09-22 NOTE — Assessment & Plan Note (Signed)
Doing well on telmisartan and nadolol. Self titrated off indapamide. No signs/symptoms heart failure. Continue current medications. Can monitor BP at home with 3 mo fu.

## 2014-09-22 NOTE — Assessment & Plan Note (Signed)
Reasonable control off of Flomax. Continue off medication. Continue to monitor.

## 2014-10-05 ENCOUNTER — Telehealth: Payer: Self-pay | Admitting: Internal Medicine

## 2014-10-05 NOTE — Telephone Encounter (Signed)
Patient is offered an appt for tomorrow, but he can't come.  He is scheduled for follow up on 10/19/14

## 2014-10-09 ENCOUNTER — Ambulatory Visit (INDEPENDENT_AMBULATORY_CARE_PROVIDER_SITE_OTHER): Payer: Medicare Other | Admitting: *Deleted

## 2014-10-09 DIAGNOSIS — E538 Deficiency of other specified B group vitamins: Secondary | ICD-10-CM

## 2014-10-09 MED ORDER — CYANOCOBALAMIN 1000 MCG/ML IJ SOLN
1000.0000 ug | Freq: Once | INTRAMUSCULAR | Status: AC
  Administered 2014-10-09: 1000 ug via INTRAMUSCULAR

## 2014-10-19 ENCOUNTER — Ambulatory Visit: Payer: Medicare Other | Admitting: Internal Medicine

## 2014-10-19 ENCOUNTER — Telehealth: Payer: Self-pay | Admitting: Family Medicine

## 2014-10-19 NOTE — Telephone Encounter (Signed)
Please advise 

## 2014-10-19 NOTE — Telephone Encounter (Signed)
Reasonable to try it. The pill form is also helpful for some.

## 2014-10-19 NOTE — Telephone Encounter (Signed)
Pt would like to know if benadryl liquid would be be a good alternative to his temazepam (RESTORIL) 15 MG capsule. Pt states his son is a Agricultural consultant and told him that what he takes.

## 2014-10-20 NOTE — Telephone Encounter (Signed)
Pt is aware of Dr. Ansel Bong recommendations.  Pt verbalized understanding.

## 2014-10-20 NOTE — Telephone Encounter (Signed)
Called and LM for pt TCB 

## 2014-10-23 ENCOUNTER — Ambulatory Visit: Payer: Medicare Other | Admitting: Interventional Cardiology

## 2014-10-26 ENCOUNTER — Encounter (HOSPITAL_COMMUNITY): Payer: Self-pay | Admitting: Interventional Cardiology

## 2014-10-30 ENCOUNTER — Encounter: Payer: Self-pay | Admitting: Family Medicine

## 2014-10-30 ENCOUNTER — Ambulatory Visit (INDEPENDENT_AMBULATORY_CARE_PROVIDER_SITE_OTHER): Payer: Medicare Other | Admitting: Family Medicine

## 2014-10-30 ENCOUNTER — Ambulatory Visit: Payer: Medicare Other | Admitting: *Deleted

## 2014-10-30 VITALS — BP 132/78 | Temp 97.6°F | Wt 197.0 lb

## 2014-10-30 DIAGNOSIS — E538 Deficiency of other specified B group vitamins: Secondary | ICD-10-CM

## 2014-10-30 DIAGNOSIS — R0789 Other chest pain: Secondary | ICD-10-CM

## 2014-10-30 MED ORDER — CYANOCOBALAMIN 1000 MCG/ML IJ SOLN
1000.0000 ug | Freq: Once | INTRAMUSCULAR | Status: AC
Start: 2014-10-30 — End: 2014-10-30
  Administered 2014-10-30: 1000 ug via INTRAMUSCULAR

## 2014-10-30 NOTE — Patient Instructions (Signed)
I think you either strained a muscle or a ligament when you were lifting the can of leaves a few weeks ago. This can last up to 6 weeks. Let's try icing are afor 3 days 20 minutes 3x a day then switch to heat. You will seek care immediately if worsens or if associated with fatigue/shortness of breath or if the pain occurs with exertion. Follow up with me in a week if your symptoms persist. I offered EKG and blood test to evaluate the heart but you declined. You are aware that we can do these for you when you follow up if symptoms persist or you can come in sooner if desired.   Chest Wall Pain Chest wall pain is pain in or around the bones and muscles of your chest. It may take up to 6 weeks to get better. It may take longer if you must stay physically active in your work and activities.  CAUSES  Chest wall pain may happen on its own. However, it may be caused by:  A viral illness like the flu.  Injury.  Coughing.  Exercise.  Arthritis.  Fibromyalgia.  Shingles. HOME CARE INSTRUCTIONS   Avoid overtiring physical activity. Try not to strain or perform activities that cause pain. This includes any activities using your chest or your abdominal and side muscles, especially if heavy weights are used.  Put ice on the sore area.  Put ice in a plastic bag.  Place a towel between your skin and the bag.  Leave the ice on for 15-20 minutes per hour while awake for the first 2 days.  Only take over-the-counter or prescription medicines for pain, discomfort, or fever as directed by your caregiver. SEEK IMMEDIATE MEDICAL CARE IF:   Your pain increases, or you are very uncomfortable.  You have a fever.  Your chest pain becomes worse.  You have new, unexplained symptoms.  You have nausea or vomiting.  You feel sweaty or lightheaded.  You have a cough with phlegm (sputum), or you cough up blood. MAKE SURE YOU:   Understand these instructions.  Will watch your condition.  Will get  help right away if you are not doing well or get worse. Document Released: 11/03/2005 Document Revised: 01/26/2012 Document Reviewed: 06/30/2011 [facility] Patient Information 2015 [place], [place]. This information is not intended to replace advice given to you by your health care provider. Make sure you discuss any questions you have with your health care provider.

## 2014-10-30 NOTE — Progress Notes (Signed)
[name], MD Phone: [phone]  Subjective:   Timothy Gallagher is a 78 y.o. year old very pleasant male patient who presents with the following:  Left sided rib pain -3 weeks ago picking up a can of leaves in the yard and felt a strain. Since that time he has felt left sided rib pain that radiates down to epigastric area. Feels better with movement. Worse if sits still. Nonexertional and not relieved by rest. Helped by tylenol and hot water. Pain can get up to 10/10 currently 1/10 with mild soreness. Described as "bearing down" and burning pain. No meal association. Patient rested from being really active in the yard over last 2 days and symptoms improved.   ROS- no shortness of breath, sweating, nausea, no neck or arm pain or central chest pain  Past Medical History- Patient Active Problem List   Diagnosis Date Noted  . S/P CABG x 4 05/19/2014    Priority: High  . Coronary artery disease 05/12/2014    Priority: High  . Anemia 07/20/2014    Priority: Medium  . BPH (benign prostatic hyperplasia) 12/05/2013    Priority: Medium  . ACTINIC KERATOSIS, HEAD 04/18/2009    Priority: Medium  . Hyperlipidemia 03/13/2008    Priority: Medium  . B12 DEFICIENCY 06/07/2007    Priority: Medium  . Essential hypertension 04/09/2007    Priority: Medium  . Palpitations 02/11/2013    Priority: Low  . OSTEOARTHRITIS, WRIST, RIGHT 08/05/2010    Priority: Low  . ACNE ROSACEA 06/27/2009    Priority: Low  . ESOPHAGEAL STRICTURE 04/27/2009    Priority: Low  . NECK PAIN 09/14/2008    Priority: Low  . NEUROPATHY, IDIOPATHIC PERIPHERAL NEC 08/11/2007    Priority: Low  . Irritable bowel syndrome 08/11/2007    Priority: Low  . ALLERGIC RHINITIS 04/09/2007    Priority: Low   Medications- reviewed and updated Current Outpatient Prescriptions  Medication Sig Dispense Refill  . aspirin EC 325 MG EC tablet Take 1 tablet (325 mg total) by mouth daily.    . cetirizine (ZYRTEC) 10 MG tablet Take  10 mg by mouth daily as needed for allergies.     . cyanocobalamin (,VITAMIN B-12,) 1000 MCG/ML injection Inject 1.68ml every 3 weeks 10 mL 3  . diphenoxylate-atropine (LOMOTIL) 2.5-0.025 MG per tablet Take 1 tablet by mouth 4 (four) times daily as needed for diarrhea or loose stools.    . fish oil-omega-3 fatty acids 1000 MG capsule Take 1 g by mouth daily. 1200 mg    . fluticasone (FLONASE) 50 MCG/ACT nasal spray Place 1-2 sprays into both nostrils daily as needed for allergies.     . Multiple Vitamin (MULTIVITAMIN WITH MINERALS) TABS tablet Take 1 tablet by mouth daily.    . nadolol (CORGARD) 20 MG tablet Take 1/2 tablet  Once dailyHOLD UNTIL FURTHER NOTIFIED BY CARDIOLOGY    . pravastatin (PRAVACHOL) 40 MG tablet Take 1 tablet (40 mg total) by mouth daily. Take 1/2 tab for first week then full tab. (Patient taking differently: Take 40 mg by mouth daily. Take 1/2 tab) 30 tablet 5  . telmisartan (MICARDIS) 80 MG tablet Take 1 tablet (80 mg total) by mouth daily.    . temazepam (RESTORIL) 15 MG capsule Take 1 capsule (15 mg total) by mouth at bedtime as needed for sleep. 30 capsule 3  . acetaminophen (TYLENOL) 500 MG tablet Take 500 mg by mouth daily as needed (for pain).     [name] Kitchen NITROSTAT 0.4 MG  SL tablet Place 0.4 mg under the tongue every 5 (five) minutes as needed.      No current facility-administered medications for this visit.    Objective: BP 140/84 mmHg  Temp(Src) 97.6 F (36.4 C)  Wt 197 lb (89.359 kg) Gen: NAD, resting comfortably on table CV: RRR no murmurs rubs or gallops MSK: under left arm around mid axillary line tenderness around 2nd and 3rd to last rib with palpation making patient jump.  Lungs: CTAB no crackles, wheeze, rhonchi Abdomen: soft/nontender/nondistended/normal bowel sounds.  Ext: no edema Skin: warm, dry, no rash over skin to suggest shingles   Assessment/Plan:  MSK chest pain (left sided rib pain) Symptoms are very atypical for any cardiac disease as  nonexertional (actually better with movement) and not relieved by rest. Also very tender with palpation over the area. No rash to suggest shingles. Pain has improved as patient has laid off heavier lifting/activities. Decision made for conservative management with ice/heat and tylenol usage. Patient given strict Return precautions for return. Warned patient that there is a very low but still possibility this is cardiac related and he understands and is willing to move forward with conservative care with close follow up.   Meds ordered this encounter  Medications  . cyanocobalamin ((VITAMIN B-12)) injection 1,000 mcg    Sig:

## 2014-11-02 ENCOUNTER — Ambulatory Visit: Payer: Medicare Other | Admitting: Family Medicine

## 2014-11-06 ENCOUNTER — Telehealth: Payer: Self-pay

## 2014-11-06 DIAGNOSIS — I10 Essential (primary) hypertension: Secondary | ICD-10-CM

## 2014-11-06 MED ORDER — TELMISARTAN 80 MG PO TABS: 80.0000 mg | ORAL_TABLET | Freq: Every day | ORAL | Status: DC

## 2014-11-06 NOTE — Telephone Encounter (Signed)
Rx request for Telmisartan 80 mg tablet- Take 1 tablet by mouth daily #90  Pharm:  [facility]   Pls advise.

## 2014-11-14 ENCOUNTER — Telehealth: Payer: Self-pay | Admitting: *Deleted

## 2014-11-14 NOTE — Telephone Encounter (Signed)
Called to f/u with pt but the phone kept ringing, no VM will try back later.

## 2014-11-14 NOTE — Telephone Encounter (Signed)
[place] Day - Client [facility] Patient Name: Timothy Gallagher Gender: Male DOB: 1936-08-23 Age: 78 Y 67 M 17 D Return Phone Number: [phone] (Primary) Address: City/State/Zip: [place] [address] Client [facility] - Client Client Site [place] - Day Physician [name] Contact Type Call Call Type Triage / Clinical Relationship To Patient Self Return Phone Number [phone] (Primary) Chief Complaint Cough Initial Comment Caller states coughing, sore throat, runny nose, coughing up clear stuff. about to lose his voice PreDisposition Call Pharmacist Nurse Assessment Nurse: Donalynn Furlong, RN, Myna Hidalgo Date/Time Eilene Ghazi Time): 11/14/2014 12:44:15 PM Confirm and document reason for call. If symptomatic, describe symptoms. ---Caller states coughing, sore throat, runny nose, coughing up clear stuff. about to lose his voice, started 1 day ago. Afebrile. no N/V/D Has the patient traveled out of the country within the last 30 days? ---No Does the patient require triage? ---Yes Related visit to physician within the last 2 weeks? ---No Does the PT have any chronic conditions? (i.e. diabetes, asthma, etc.) ---Yes List chronic conditions. ---"heart bypass surgery" x 4 vessels Guidelines Guideline Title Affirmed Question Affirmed Notes Nurse Date/Time (Eastern Time) Common Cold [1] Nasal discharge AND [2] present > 10 days Donalynn Furlong, RN, Myna Hidalgo 11/14/2014 12:46:19 PM Disp. Time Eilene Ghazi Time) Disposition Final User 11/14/2014 12:52:27 PM See PCP When Office is Open (within 3 days) Yes Donalynn Furlong, RN, Myna Hidalgo Caller Understands: Yes Disagree/Comply: Comply Care Advice Given Per Guideline PLEASE NOTE: All timestamps contained within this report are represented as [place] Standard Time. CONFIDENTIALTY NOTICE: This fax transmission is intended only for the  addressee. It contains information that is legally privileged, confidential or otherwise protected from use or disclosure. If you are not the intended recipient, you are strictly prohibited from reviewing, disclosing, copying using or disseminating any of this information or taking any action in reliance on or regarding this information. If you have received this fax in error, please notify us immediately by telephone so that we can arrange for its return to [place]. Phone: [phone], Toll-Free: [phone], Fax: [phone] Page: 2 of 2 Call Id: [id number] Care Advice Given Per Guideline SEE PCP WITHIN 3 DAYS: You need to be examined within 2 or 3 days. Call your doctor during regular office hours and make an appointment. (Note: if office will be open tomorrow, tell caller to call then, not in 3 days). FOR A STUFFY NOSE - USE NASAL WASHES: * Introduction: Saline (salt water) nasal irrigation (nasal wash) is an effective and simple home remedy for treating stuffy nose and sinus congestion. The nose can be irrigated by pouring, spraying, or squirting salt water into the nose and then letting it run back out. * How it Helps: The salt water rinses out excess mucus, washes out any irritants (dust, allergens) that might be present, and moistens the nasal cavity. * Methods: There are several ways to perform nasal irrigation. You can use a saline nasal spray bottle (available over-the-counter), a rubber ear syringe, a medical syringe without the needle, or a NETI POT. FOR A RUNNY NOSE - BLOW YOUR NOSE: * Nasal mucus and discharge help wash viruses and bacteria out of the nose and sinuses. * Blowing your nose helps clean out your nose. Use a handkerchief or a paper tissue. * If the skin around your nostrils gets irritated, apply a tiny amount of petroleum ointment to the nasal openings once or twice a day. MEDICINES FOR STUFFY OR  RUNNY NOSE: * Most cold medicines that are available over-the-counter (OTC) are  not helpful. CAUTION - NASAL DECONGESTANTS: HUMIDIFIER: If the air in your home is dry, use a humidifier. TREATMENT FOR ASSOCIATED SYMPTOMS OF COLDS: * For muscle aches, headaches, or moderate fever (over 101 degrees F) (38.9 C) use acetaminophen every 4 hours. * Sore throat: throat lozenges, hard candy or warm chicken broth. * Cough: use cough drops. * Hydrate: drink extra liquids. CALL BACK IF: * Fever over 104 F * You have difficulty breathing CARE ADVICE given per Colds (Adult) guideline. * You become worse. NASAL DECONGESTANTS FOR A VERY STUFFY NOSE: After Care Instructions Given Call Event Type User Date / Time Description

## 2014-11-15 ENCOUNTER — Encounter: Payer: Self-pay | Admitting: Family Medicine

## 2014-11-15 ENCOUNTER — Ambulatory Visit (INDEPENDENT_AMBULATORY_CARE_PROVIDER_SITE_OTHER): Payer: Medicare Other | Admitting: Family Medicine

## 2014-11-15 VITALS — BP 120/80 | HR 78 | Temp 98.6°F | Wt 197.0 lb

## 2014-11-15 DIAGNOSIS — J040 Acute laryngitis: Secondary | ICD-10-CM

## 2014-11-15 DIAGNOSIS — R059 Cough, unspecified: Secondary | ICD-10-CM

## 2014-11-15 DIAGNOSIS — R05 Cough: Secondary | ICD-10-CM

## 2014-11-15 MED ORDER — HYDROCODONE-HOMATROPINE 5-1.5 MG/5ML PO SYRP: 5.0000 mL | ORAL_SOLUTION | Freq: Four times a day (QID) | ORAL | Status: AC | PRN

## 2014-11-15 NOTE — Patient Instructions (Signed)

## 2014-11-15 NOTE — Progress Notes (Signed)
Subjective:    Patient ID: Timothy Gallagher, male    DOB: 03/21/1936, 78 y.o.   MRN: [id number]  HPI   Patient seen with 3 day history of cough and laryngitis symptoms. Possible low-grade fever but not confirmed. This was subjective. No chills. He had CABG back in July and states he's had some anterior chest wall soreness with coughing. No dyspnea. No hemoptysis. No wheezing. He's tried over-the-counter cough medications without much relief  Past Medical History  Diagnosis Date  . ACNE ROSACEA 06/27/2009  . ACTINIC KERATOSIS, HEAD 04/18/2009  . Acute maxillary sinusitis 05/14/2010  . ALLERGIC RHINITIS 04/09/2007  . B12 DEFICIENCY 06/07/2007  . BACK PAIN WITH RADICULOPATHY 04/24/2008  . CHEST WALL PAIN, ACUTE 06/15/2009  . Chronic maxillary sinusitis 05/29/2008  . COLITIS 04/27/2009  . COLONIC POLYPS, HX OF 04/27/2009    tubular adenomas  . DERMATITIS, ATOPIC 10/12/2007  . DIVERTICULOSIS, COLON 04/27/2009  . ECCHYMOSES, SPONTANEOUS 06/27/2008  . Elevated sedimentation rate 05/02/2009  . ESOPHAGEAL STRICTURE 04/27/2009  . GASTRITIS, CHRONIC 04/27/2009  . HYPERLIPIDEMIA 03/13/2008  . HYPERTENSION 04/09/2007  . Irritable bowel syndrome 08/11/2007  . KIDNEY DISEASE 04/09/2007  . NECK PAIN 09/14/2008  . NEUROPATHY, IDIOPATHIC PERIPHERAL NEC 08/11/2007  . OSTEOARTHRITIS, WRIST, RIGHT 08/05/2010  . Cancer     skin  . Coronary artery disease 05/12/2014    Cath 05/12/2014 w/ severe 3-vessel CAD and preserved LV function, EF 55%  . S/P CABG x 4 05/19/2014    LIMA to LAD, SVG to diag, SVG to OM, SVG to PDA, EVH via right thigh and leg  . Postoperative delirium 05/20/2014   Past Surgical History  Procedure Laterality Date  . Hernia repair    . Lamenectomy    . Lumbar fusion    . Tonsillectomy    . Incision and drainage wound with foreign body removal Left 12/20/2013    Procedure: INCISION AND DRAINAGE LEFT INDEX FINGER;  Surgeon: Tennis Must, MD;  Location: [facility];  Service: Orthopedics;  Laterality: Left;    . Esophagogastroduodenoscopy    . Colonoscopy    . Flexible sigmoidoscopy    . Varicose vein surgery Left   . Cardiac catheterization    . Finger surgery      cut off end of finger  . Coronary artery bypass graft N/A 05/19/2014    Procedure: CORONARY ARTERY BYPASS GRAFTING (CABG);  Surgeon: Rexene Alberts, MD;  Location: [facility];  Service: Open Heart Surgery;  Laterality: N/A;  Times 4 using left internal mammary artery and endoscopically harvested right saphenous vein  . Intraoperative transesophageal echocardiogram N/A 05/19/2014    Procedure: INTRAOPERATIVE TRANSESOPHAGEAL ECHOCARDIOGRAM;  Surgeon: Rexene Alberts, MD;  Location: [facility];  Service: Open Heart Surgery;  Laterality: N/A;  . Left heart catheterization with coronary angiogram N/A 05/12/2014    Procedure: LEFT HEART CATHETERIZATION WITH CORONARY ANGIOGRAM;  Surgeon: Sinclair Grooms, MD;  Location[facility];  Service: Cardiovascular;  Laterality: N/A;    reports that he quit smoking about 13 years ago. His smoking use included Cigarettes. He has a 21 pack-year smoking history. He has never used smokeless tobacco. He reports that he does not drink alcohol or use illicit drugs. family history includes COPD in his father; Heart disease in his father; Hernia in his mother. There is no history of Colon cancer. Allergies  Allergen Reactions  . Ciprofloxacin Swelling  . Lipitor [Atorvastatin]     REACTION: nausea and blurred vision  .  Mycophenolate Mofetil     REACTION: unspecified  . Rosuvastatin     Unknown   . Amoxicillin Rash  . Penicillins Rash      Review of Systems  Constitutional: Negative for chills and fatigue.  HENT: Positive for congestion, sore throat and voice change.   Respiratory: Positive for cough.        Objective:   Physical Exam  Constitutional: He appears well-developed and well-nourished.  HENT:  Right Ear: External ear normal.  Left Ear: External ear normal.  Mouth/Throat: Oropharynx is clear  and moist.  Neck: Neck supple.  Cardiovascular: Normal rate and regular rhythm.   Pulmonary/Chest: Effort normal and breath sounds normal. No respiratory distress. He has no wheezes. He has no rales.  Lymphadenopathy:    He has no cervical adenopathy.          Assessment & Plan:  Patient seen with cough and laryngitis. Suspect viral syndrome. Afebrile this time with nonfocal exam. Symptomatic treatment. Wrote for limited Hycodan 1 teaspoon daily at bedtime for severe cough as needed.

## 2014-11-15 NOTE — Progress Notes (Signed)
Pre visit review using our clinic review tool, if applicable. No additional management support is needed unless otherwise documented below in the visit note. 

## 2014-11-17 ENCOUNTER — Telehealth: Payer: Self-pay | Admitting: Family Medicine

## 2014-11-18 ENCOUNTER — Telehealth: Payer: Self-pay | Admitting: Family Medicine

## 2014-11-20 ENCOUNTER — Ambulatory Visit (INDEPENDENT_AMBULATORY_CARE_PROVIDER_SITE_OTHER): Payer: Medicare Other | Admitting: Family Medicine

## 2014-11-20 DIAGNOSIS — E538 Deficiency of other specified B group vitamins: Secondary | ICD-10-CM

## 2014-11-20 MED ORDER — CYANOCOBALAMIN 1000 MCG/ML IJ SOLN
1000.0000 ug | Freq: Once | INTRAMUSCULAR | Status: AC
  Administered 2014-11-20: 1000 ug via INTRAMUSCULAR

## 2014-11-20 NOTE — Telephone Encounter (Signed)
PLEASE NOTE: All timestamps contained within this report are represented as [place] Standard Time. CONFIDENTIALTY NOTICE: This fax transmission is intended only for the addressee. It contains information that is legally privileged, confidential or otherwise protected from use or disclosure. If you are not the intended recipient, you are strictly prohibited from reviewing, disclosing, copying using or disseminating any of this information or taking any action in reliance on or regarding this information. If you have received this fax in error, please notify us immediately by telephone so that we can arrange for its return to [place]. Phone: [phone], Toll-Free: [phone], Fax: [phone] Page: 1 of 2 Call Id: [id number] [facility] - Client [place] Patient Name: Timothy Gallagher Gender: Male DOB: [birth year] Age: 79 Y 27 M 20 D Return Phone Number: [phone] (Primary) Address: [address] City/State/Zip: [place] [address] Client [facility] Night - Client Client Site [facility] Night Physician Carolann Littler Contact Type Call Call Type Triage / Clinical Relationship To Patient Self Return Phone Number [phone] (Primary) Chief Complaint Cough Initial Comment Caller states, dx with bronchitis on Tues, now he is coughing up some mucus, has a very sore throaty, feels weak, PreDisposition Call Doctor Nurse Assessment Nurse: Bertell Maria Date/Time Eilene Ghazi Time): 11/17/2014 9:04:01 AM Confirm and document reason for call. If symptomatic, describe symptoms. ---Caller states he was diagnosed with bronchitis on Tues, now he is coughing up some mucus. His temp was 97.7 (behind ear), blood pressure 122/64. Still has a sore throat but it is starting to feel better. Reports not having a lot of energy. seems to be coughing up more than before. Has the patient traveled  out of the country within the last 30 days? ---No Does the patient require triage? ---Yes Related visit to physician within the last 2 weeks? ---Yes tue Does the PT have any chronic conditions? (i.e. diabetes, asthma, etc.) ---Yes List chronic conditions. ---bypass surgery. Guidelines Guideline Title Affirmed Question Affirmed Notes Nurse Date/Time Eilene Ghazi Time) Cough - Acute Productive Cough (all triage questions negative) Bertell Maria 11/17/2014 9:08:26 AM Disp. Time Eilene Ghazi Time) Disposition Final User 11/17/2014 9:14:10 AM Home Care Yes Bertell Maria Caller Understands: Yes Disagree/Comply: Comply PLEASE NOTE: All timestamps contained within this report are represented as [place] Standard Time. CONFIDENTIALTY NOTICE: This fax transmission is intended only for the addressee. It contains information that is legally privileged, confidential or otherwise protected from use or disclosure. If you are not the intended recipient, you are strictly prohibited from reviewing, disclosing, copying using or disseminating any of this information or taking any action in reliance on or regarding this information. If you have received this fax in error, please notify us immediately by telephone so that we can arrange for its return to [place]. Phone: [phone], Toll-Free: [phone], Fax: [phone] Page: 2 of 2 Call Id: [id number] Care Advice Given Per Guideline HOME CARE: You should be able to treat this at home. REASSURANCE: * It doesn't sound like a serious cough. * Coughing up mucus is very important for protecting the lungs from pneumonia. COUGH MEDICINES: - OTC COUGH SYRUPS: The most common cough suppressant in OTC cough medications is dextromethorphan. Often the letters 'DM' appear in the name. - OTC COUGH DROPS: Cough drops can help a lot, especially for mild coughs. They reduce coughing by soothing your irritated throat and removing that tickle sensation in the back of the throat.  Cough drops also have the advantage of portability -  you can carry them with you. - HOME REMEDY - HONEY: This old home remedy has been shown to help decrease coughing at night. The adult dosage is 2 teaspoons (10 ml) at bedtime. Honey should not be given to infants under one year of age. HUMIDIFIER: If the air is dry, use a humidifier in the bedroom. (Reason: dry air makes coughs worse) AVOID TOBACCO SMOKE: Smoking or being exposed to smoke makes coughs much worse. PREVENT DEHYDRATION: * Drink adequate liquids. * This will help soothe an irritated or dry throat and loosen up the phlegm. EXPECTED COURSE: Viral bronchitis causes a cough for 1 to 3 weeks. Sometimes you may cough up lots of phlegm (mucus). The mucus can normally be white, gray, yellow or green. CALL BACK IF: * Cough lasts over 3 weeks * Continuous coughing persists over 2 hours after cough treatment * Difficulty breathing occurs * Fever over 103 F (39.4 C) * Fever lasts over 3 days * You become worse. CARE ADVICE given per Cough - Acute Productive (Adult) guideline. After Care Instructions Given Call Event Type User Date / Time Description

## 2014-11-20 NOTE — Telephone Encounter (Signed)
PLEASE NOTE: All timestamps contained within this report are represented as [place] Standard Time. CONFIDENTIALTY NOTICE: This fax transmission is intended only for the addressee. It contains information that is legally privileged, confidential or otherwise protected from use or disclosure. If you are not the intended recipient, you are strictly prohibited from reviewing, disclosing, copying using or disseminating any of this information or taking any action in reliance on or regarding this information. If you have received this fax in error, please notify us immediately by telephone so that we can arrange for its return to [place]. Phone: [phone], Toll-Free: [phone], Fax: [phone] Page: 1 of 2 Call Id: [id number] [facility] - Client [place] Patient Name: Timothy Gallagher [place] Gender: Male DOB: [birth year] Age: 79 Y 57 M 21 D Return Phone Number: [phone] (Primary) Address: [place] City/State/Zip: [place] [address] Client [facility] Night - Client Client Site [facility] - Night Physician [name] Contact Type Call Call Type Triage / Clinical Relationship To Patient Self Return Phone Number [phone] (Primary) Chief Complaint Cough Initial Comment Caller states, has a cough, he is on hydrocodone, wants to know if he can take Delsum cough syrup or if something else would be better ? Nurse Assessment Nurse: Malva Cogan, RN, Juliann Pulse Date/Time Eilene Ghazi Time): 11/18/2014 12:47:14 PM Confirm and document reason for call. If symptomatic, describe symptoms. ---Caller states that he was prescribed Hydrocodone Homatropine cough syrup on Tuesday when he was diagnosed with bronchitis, is ordered 1 tsp. every 6 hrs & caller states that he is still coughing & wants to know if he can take OTC Delsym with prescribed cough syrup. Caller declined triaging when  offered. Caller states that he does have a productive cough. Advised caller that productive coughs should not be completely suppressed as coughing up mucus is protective measure to prevent pneumonia so he should probably stick to one or the other of these meds. Called yesterday about his cough, see record # [id number]. Has the patient traveled out of the country within the last 30 days? ---No Does the patient require triage? ---Declined Triage Please document clinical information provided and list any resource used. ---http://www.drugs.com/interactions-check.php? drug_list=1248-0,(314)539-9698, homatropine / hydrocodone Delsym (dextromethorphan) Interactions between your selected drugs No results found - however, this does not necessarily mean no interactions exist. ALWAYS consult with your doctor or pharmacist. Guidelines Guideline Title Affirmed Question Affirmed Notes Nurse Date/Time ([place] Time) Disp. Time Eilene Ghazi Time) Disposition Final User 11/18/2014 12:10:18 PM Send To RN Personal Donne Anon, RN, Debra 11/18/2014 12:47:05 PM Clinical Call Yes Malva Cogan, RN, Juliann Pulse PLEASE NOTE: All timestamps contained within this report are represented as [place] Standard Time. CONFIDENTIALTY NOTICE: This fax transmission is intended only for the addressee. It contains information that is legally privileged, confidential or otherwise protected from use or disclosure. If you are not the intended recipient, you are strictly prohibited from reviewing, disclosing, copying using or disseminating any of this information or taking any action in reliance on or regarding this information. If you have received this fax in error, please notify us immediately by telephone so that we can arrange for its return to [place]. Phone: [phone], Toll-Free: [phone], Fax: [phone] Page: 2 of 2 Call Id: [id number] After Care Instructions Given Call Event Type User Date / Time Description

## 2014-11-22 ENCOUNTER — Ambulatory Visit (INDEPENDENT_AMBULATORY_CARE_PROVIDER_SITE_OTHER): Payer: Medicare Other | Admitting: Family Medicine

## 2014-11-22 ENCOUNTER — Encounter: Payer: Self-pay | Admitting: Family Medicine

## 2014-11-22 VITALS — BP 120/70 | HR 71 | Temp 97.7°F | Wt 197.0 lb

## 2014-11-22 DIAGNOSIS — R059 Cough, unspecified: Secondary | ICD-10-CM

## 2014-11-22 DIAGNOSIS — J040 Acute laryngitis: Secondary | ICD-10-CM

## 2014-11-22 DIAGNOSIS — R05 Cough: Secondary | ICD-10-CM

## 2014-11-22 DIAGNOSIS — G4700 Insomnia, unspecified: Secondary | ICD-10-CM | POA: Insufficient documentation

## 2014-11-22 MED ORDER — TEMAZEPAM 15 MG PO CAPS: 15.0000 mg | ORAL_CAPSULE | Freq: Every evening | ORAL | Status: DC | PRN

## 2014-11-22 MED ORDER — PREDNISONE 20 MG PO TABS: ORAL_TABLET | ORAL | Status: DC

## 2014-11-22 MED ORDER — GUAIFENESIN ER 600 MG PO TB12: 1200.0000 mg | ORAL_TABLET | Freq: Two times a day (BID) | ORAL | Status: DC

## 2014-11-22 NOTE — Progress Notes (Signed)
[name], MD Phone: [phone]  Subjective:   Timothy Gallagher is a 79 y.o. year old very pleasant male patient who presents with the following:  Cough/congestion -x 2 weeks. Seen 12/30 by Dr. Elease Hashimoto diagnosed with cough/laryngitis/viral syndrome. Hycodan was given which has helped some with cough especially sleep at night.   Symptoms have continued. Stable in course.   ROS- no sore throat/fever/shortness of breath/ear ache/sinus pressure.   Past Medical History- Patient Active Problem List   Diagnosis Date Noted  . S/P CABG x 4 05/19/2014    Priority: High  . Coronary artery disease 05/12/2014    Priority: High  . Insomnia 11/22/2014    Priority: Medium  . Anemia 07/20/2014    Priority: Medium  . BPH (benign prostatic hyperplasia) 12/05/2013    Priority: Medium  . ACTINIC KERATOSIS, HEAD 04/18/2009    Priority: Medium  . Hyperlipidemia 03/13/2008    Priority: Medium  . B12 DEFICIENCY 06/07/2007    Priority: Medium  . Essential hypertension 04/09/2007    Priority: Medium  . Palpitations 02/11/2013    Priority: Low  . OSTEOARTHRITIS, WRIST, RIGHT 08/05/2010    Priority: Low  . ACNE ROSACEA 06/27/2009    Priority: Low  . ESOPHAGEAL STRICTURE 04/27/2009    Priority: Low  . NECK PAIN 09/14/2008    Priority: Low  . NEUROPATHY, IDIOPATHIC PERIPHERAL NEC 08/11/2007    Priority: Low  . Irritable bowel syndrome 08/11/2007    Priority: Low  . ALLERGIC RHINITIS 04/09/2007    Priority: Low   Medications- reviewed and updated Current Outpatient Prescriptions  Medication Sig Dispense Refill  . aspirin EC 325 MG EC tablet Take 1 tablet (325 mg total) by mouth daily.    . cetirizine (ZYRTEC) 10 MG tablet Take 10 mg by mouth daily as needed for allergies.     . cyanocobalamin (,VITAMIN B-12,) 1000 MCG/ML injection Inject 1.1ml every 3 weeks 10 mL 3  . fish oil-omega-3 fatty acids 1000 MG capsule Take 1 g by mouth daily. 1200 mg    . fluticasone (FLONASE) 50  MCG/ACT nasal spray Place 1-2 sprays into both nostrils daily as needed for allergies.     [name] Kitchen HYDROcodone-homatropine (HYCODAN) 5-1.5 MG/5ML syrup Take 5 mLs by mouth every 6 (six) hours as needed. 120 mL 0  . Multiple Vitamin (MULTIVITAMIN WITH MINERALS) TABS tablet Take 1 tablet by mouth daily.    . nadolol (CORGARD) 20 MG tablet Take 1/2 tablet  Once dailyHOLD UNTIL FURTHER NOTIFIED BY CARDIOLOGY    . pravastatin (PRAVACHOL) 40 MG tablet Take 1 tablet (40 mg total) by mouth daily. Take 1/2 tab for first week then full tab. (Patient taking differently: Take 40 mg by mouth daily. Take 1/2 tab) 30 tablet 5  . telmisartan (MICARDIS) 80 MG tablet Take 1 tablet (80 mg total) by mouth daily. 90 tablet 1  . temazepam (RESTORIL) 15 MG capsule Take 1 capsule (15 mg total) by mouth at bedtime as needed for sleep. 30 capsule 3  . acetaminophen (TYLENOL) 500 MG tablet Take 500 mg by mouth daily as needed (for pain).     [name] Kitchen diphenoxylate-atropine (LOMOTIL) 2.5-0.025 MG per tablet Take 1 tablet by mouth 4 (four) times daily as needed for diarrhea or loose stools.    [name] Kitchen NITROSTAT 0.4 MG SL tablet Place 0.4 mg under the tongue every 5 (five) minutes as needed.      Objective: BP 120/70 mmHg  Pulse 71  Temp(Src) 97.7 F (36.5 C)  Wt 197  lb (89.359 kg)  SpO2 99%  Gen: NAD, resting comfortably HEENT: nares erythematous and swollen, mild erythema on pharynx normal without pharyngeal exudate, TM normal bilaterally, Mucous membranes are moist. No sinus tenderness.  CV: RRR no murmurs rubs or gallops Lungs: CTAB no crackles, wheeze, rhonchi Ext: no edema Patient does have some tenderness over left ankle ATFL (denies fall or twisting) Skin: warm, dry, no rash Neuro: grossly normal, moves all extremities   Assessment/Plan:  Laryngitis vs. Bronchitis Once again nonfocal exam, likely viral illness-laryngitis vs. bronchitis Advised of symptomatic care (see AVS) including prednisone to decrease inflammation  (although we discussed likely would still have prolonged symptoms) Guaifenesin to help loosen cough.  Likely viral syndrome Doubt influenza as afebrile Given reasons for return per AVS, also if shortness of breath developed  Also has some atfl tenderness which he states has bene going on about a week and improving. Discussed taking it easy on his feet and prednisone may help as well. Follow up if worsens or does not continue to improve.   Meds ordered this encounter  Medications  . predniSONE (DELTASONE) 20 MG tablet    Sig: Take 2 tabs for 2 days then 1 tab for 5 days then 1/2 tab for 2 days then stop.    Dispense:  10 tablet    Refill:  0  . guaiFENesin (MUCINEX) 600 MG 12 hr tablet    Sig: Take 2 tablets (1,200 mg total) by mouth 2 (two) times daily.    Dispense:  30 tablet    Refill:  0  . temazepam (RESTORIL) 15 MG capsule    Sig: Take 1 capsule (15 mg total) by mouth at bedtime as needed for sleep.    Dispense:  30 capsule    Refill:  3

## 2014-11-22 NOTE — Patient Instructions (Signed)
Bronchitis  Take prednisone as directed  This can last over 4 weeks even with medicine  See [place] if febrile, worsening symptoms, persistent after a month.   Try mucinex to help you cough the phlegm up.

## 2014-11-27 ENCOUNTER — Ambulatory Visit (INDEPENDENT_AMBULATORY_CARE_PROVIDER_SITE_OTHER): Payer: Medicare Other | Admitting: Interventional Cardiology

## 2014-11-27 ENCOUNTER — Encounter: Payer: Self-pay | Admitting: Interventional Cardiology

## 2014-11-27 VITALS — BP 136/84 | HR 69 | Ht 73.0 in | Wt 195.0 lb

## 2014-11-27 DIAGNOSIS — I10 Essential (primary) hypertension: Secondary | ICD-10-CM

## 2014-11-27 DIAGNOSIS — E785 Hyperlipidemia, unspecified: Secondary | ICD-10-CM

## 2014-11-27 DIAGNOSIS — Z951 Presence of aortocoronary bypass graft: Secondary | ICD-10-CM

## 2014-11-27 MED ORDER — NADOLOL 20 MG PO TABS: 20.0000 mg | ORAL_TABLET | Freq: Two times a day (BID) | ORAL | Status: DC

## 2014-11-27 NOTE — Progress Notes (Signed)
Patient ID: Timothy Gallagher, male   DOB: [birth year], 79 y.o.   MRN: [id number]    [address] [place], [place]  [address] Phone: [phone] Fax:  [phone]  Date:  11/27/2014   ID:  Timothy Gallagher, DOB 06-Dec-1935, MRN [id number]  PCP:  [name], MD   ASSESSMENT:  1. Coronary artery disease with four-vessel coronary bypass surgery in July 2015, asymptomatic 2. Hyperlipidemia on therapy with pravastatin and omega-3 fatty acids 3. Hypertension, poorly controlled  PLAN:  1. Nadololl 20 mg twice a day 2. Clinical follow-up in 6 months 3. Call if her pressure is too low or if problems   SUBJECTIVE: Timothy Gallagher is a 79 y.o. male who is doing well. He has had nasal congestion. He denies angina and states he feels the best in several years. He denies orthopnea. No edema. He has not had syncope. Denies claudication.   Wt Readings from Last 3 Encounters:  11/27/14 195 lb (88.451 kg)  11/22/14 197 lb (89.359 kg)  11/15/14 197 lb (89.359 kg)     Past Medical History  Diagnosis Date  . ACNE ROSACEA 06/27/2009  . ACTINIC KERATOSIS, HEAD 04/18/2009  . Acute maxillary sinusitis 05/14/2010  . ALLERGIC RHINITIS 04/09/2007  . B12 DEFICIENCY 06/07/2007  . BACK PAIN WITH RADICULOPATHY 04/24/2008  . CHEST WALL PAIN, ACUTE 06/15/2009  . Chronic maxillary sinusitis 05/29/2008  . COLITIS 04/27/2009  . COLONIC POLYPS, HX OF 04/27/2009    tubular adenomas  . DERMATITIS, ATOPIC 10/12/2007  . DIVERTICULOSIS, COLON 04/27/2009  . ECCHYMOSES, SPONTANEOUS 06/27/2008  . Elevated sedimentation rate 05/02/2009  . ESOPHAGEAL STRICTURE 04/27/2009  . GASTRITIS, CHRONIC 04/27/2009  . HYPERLIPIDEMIA 03/13/2008  . HYPERTENSION 04/09/2007  . Irritable bowel syndrome 08/11/2007  . KIDNEY DISEASE 04/09/2007  . NECK PAIN 09/14/2008  . NEUROPATHY, IDIOPATHIC PERIPHERAL NEC 08/11/2007  . OSTEOARTHRITIS, WRIST, RIGHT 08/05/2010  . Cancer     skin  . Coronary artery disease 05/12/2014    Cath  05/12/2014 w/ severe 3-vessel CAD and preserved LV function, EF 55%  . S/P CABG x 4 05/19/2014    LIMA to LAD, SVG to diag, SVG to OM, SVG to PDA, EVH via right thigh and leg  . Postoperative delirium 05/20/2014    Current Outpatient Prescriptions  Medication Sig Dispense Refill  . acetaminophen (TYLENOL) 500 MG tablet Take 500 mg by mouth daily as needed (for pain).     [name] Kitchen aspirin EC 325 MG EC tablet Take 1 tablet (325 mg total) by mouth daily.    . cetirizine (ZYRTEC) 10 MG tablet Take 10 mg by mouth daily as needed for allergies.     . cyanocobalamin (,VITAMIN B-12,) 1000 MCG/ML injection Inject 1.79ml every 3 weeks 10 mL 3  . diphenoxylate-atropine (LOMOTIL) 2.5-0.025 MG per tablet Take 1 tablet by mouth 4 (four) times daily as needed for diarrhea or loose stools.    . fish oil-omega-3 fatty acids 1000 MG capsule Take 1 g by mouth daily. 1200 mg    . fluticasone (FLONASE) 50 MCG/ACT nasal spray Place 1-2 sprays into both nostrils daily as needed for allergies.     [name] Kitchen guaiFENesin (MUCINEX) 600 MG 12 hr tablet Take 2 tablets (1,200 mg total) by mouth 2 (two) times daily. 30 tablet 0  . Multiple Vitamin (MULTIVITAMIN WITH MINERALS) TABS tablet Take 1 tablet by mouth daily.    . nadolol (CORGARD) 20 MG tablet Take 1/2 tablet  Once dailyHOLD UNTIL FURTHER NOTIFIED BY  CARDIOLOGY    . NITROSTAT 0.4 MG SL tablet Place 0.4 mg under the tongue every 5 (five) minutes as needed for chest pain.     . pravastatin (PRAVACHOL) 40 MG tablet Take 1 tablet (40 mg total) by mouth daily. Take 1/2 tab for first week then full tab. (Patient taking differently: Take 40 mg by mouth daily. Take 1/2 tab) 30 tablet 5  . predniSONE (DELTASONE) 20 MG tablet Take 2 tabs for 2 days then 1 tab for 5 days then 1/2 tab for 2 days then stop. 10 tablet 0  . telmisartan (MICARDIS) 80 MG tablet Take 1 tablet (80 mg total) by mouth daily. 90 tablet 1  . temazepam (RESTORIL) 15 MG capsule Take 1 capsule (15 mg total) by mouth at bedtime  as needed for sleep. 30 capsule 3   No current facility-administered medications for this visit.    Allergies:    Allergies  Allergen Reactions  . Ciprofloxacin Swelling  . Lipitor [Atorvastatin]     REACTION: nausea and blurred vision  . Mycophenolate Mofetil     REACTION: unspecified  . Rosuvastatin     Unknown   . Amoxicillin Rash  . Penicillins Rash    Social History:  The patient  reports that he quit smoking about 13 years ago. His smoking use included Cigarettes. He has a 21 pack-year smoking history. He has never used smokeless tobacco. He reports that he does not drink alcohol or use illicit drugs.   ROS:  Please see the history of present illness.   Nasal congestion and cough is clearing. No chills or fever. He has been having night sweats. He is on prednisone.   All other systems reviewed and negative.   OBJECTIVE: VS:  BP 136/84 mmHg  Pulse 69  Ht 6\' 1"  (1.854 m)  Wt 195 lb (88.451 kg)  BMI 25.73 kg/m2 Well nourished, well developed, in no acute distress, slender but healthy-appearing HEENT: normal Neck: JVD flat. Carotid bruit absent  Cardiac:  normal S1, S2; RRR; no murmur Lungs:  clear to auscultation bilaterally, no wheezing, rhonchi or rales Abd: soft, nontender, no hepatomegaly Ext: Edema none. Pulses 2+ bilateral Skin: warm and dry Neuro:  CNs 2-12 intact, no focal abnormalities noted  EKG:  Not performed       Signed, Illene Labrador III, MD 11/27/2014 2:48 PM

## 2014-11-27 NOTE — Patient Instructions (Signed)
Your physician has recommended you make the following change in your medication:  1) INCREASE Cogard to 20mg  Twice Daily. An Rx has been sent to your pharmacy  Your physician wants you to follow-up in: 6 months with Dr.Smith You will receive a reminder letter in the mail two months in advance. If you don't receive a letter, please call our office to schedule the follow-up appointment.

## 2014-12-04 ENCOUNTER — Encounter: Payer: Self-pay | Admitting: Family Medicine

## 2014-12-04 ENCOUNTER — Ambulatory Visit (INDEPENDENT_AMBULATORY_CARE_PROVIDER_SITE_OTHER): Payer: Medicare Other | Admitting: Family Medicine

## 2014-12-04 VITALS — BP 108/68 | Temp 98.1°F | Wt 194.0 lb

## 2014-12-04 DIAGNOSIS — E538 Deficiency of other specified B group vitamins: Secondary | ICD-10-CM

## 2014-12-04 DIAGNOSIS — J019 Acute sinusitis, unspecified: Secondary | ICD-10-CM

## 2014-12-04 DIAGNOSIS — B9689 Other specified bacterial agents as the cause of diseases classified elsewhere: Secondary | ICD-10-CM

## 2014-12-04 MED ORDER — FLUTICASONE PROPIONATE 50 MCG/ACT NA SUSP
1.0000 | Freq: Every day | NASAL | Status: DC | PRN
Start: 2014-12-04 — End: 2015-11-08

## 2014-12-04 MED ORDER — CYANOCOBALAMIN 1000 MCG/ML IJ SOLN
1000.0000 ug | Freq: Once | INTRAMUSCULAR | Status: AC
  Administered 2014-12-04: 1000 ug via INTRAMUSCULAR

## 2014-12-04 MED ORDER — AZITHROMYCIN 250 MG PO TABS: ORAL_TABLET | ORAL | Status: DC

## 2014-12-04 NOTE — Patient Instructions (Signed)
Bacterial Sinusitis (based off of symptoms for 3 weeks, improvement then worsening with right maxillary sinus tenderness, headaches, continued congestion). Treat with azithromycin due to amoxicillin allergy.   Follow up if symptoms do not resolve within 10 days or if new symptoms.

## 2014-12-04 NOTE — Progress Notes (Signed)
[name], MD Phone: [phone]  Subjective:   Timothy Gallagher is a 79 y.o. year old very pleasant male patient who presents with the following:  Cough/congestion/sinus pressure -x 2 weeks. Seen 12/30 by Dr. Elease Hashimoto diagnosed with cough/laryngitis/viral syndrome. Hycodan was given which has helped some with cough especially sleep at night. I saw patient 11/22/14 and thought bronchitis vs laryngitis and treated with 7 days of prednisone. Did some better with prednisone but a few days later symptoms worsened.   Congestion feels like in his throat and sinuses. Tender sinuses and some headaches have been a progressive symptom. No fever. Some vomiting after coughing yesterday. Yesterday was one of the worst days of illness so far. Patient also has some body aches including in his calves at times.   ROS- no sore throat/fever/shortness of breath/ear ache.   Past Medical History- Patient Active Problem List   Diagnosis Date Noted  . S/P CABG x 4 05/19/2014    Priority: High  . Insomnia 11/22/2014    Priority: Medium  . Anemia 07/20/2014    Priority: Medium  . BPH (benign prostatic hyperplasia) 12/05/2013    Priority: Medium  . ACTINIC KERATOSIS, HEAD 04/18/2009    Priority: Medium  . Hyperlipidemia 03/13/2008    Priority: Medium  . B12 DEFICIENCY 06/07/2007    Priority: Medium  . Essential hypertension 04/09/2007    Priority: Medium  . Palpitations 02/11/2013    Priority: Low  . OSTEOARTHRITIS, WRIST, RIGHT 08/05/2010    Priority: Low  . ACNE ROSACEA 06/27/2009    Priority: Low  . ESOPHAGEAL STRICTURE 04/27/2009    Priority: Low  . NECK PAIN 09/14/2008    Priority: Low  . NEUROPATHY, IDIOPATHIC PERIPHERAL NEC 08/11/2007    Priority: Low  . Irritable bowel syndrome 08/11/2007    Priority: Low  . ALLERGIC RHINITIS 04/09/2007    Priority: Low   Medications- reviewed and updated Current Outpatient Prescriptions  Medication Sig Dispense Refill  . aspirin EC 325 MG EC  tablet Take 1 tablet (325 mg total) by mouth daily.    . cetirizine (ZYRTEC) 10 MG tablet Take 10 mg by mouth daily as needed for allergies.     . cyanocobalamin (,VITAMIN B-12,) 1000 MCG/ML injection Inject 1.51ml every 3 weeks 10 mL 3  . fish oil-omega-3 fatty acids 1000 MG capsule Take 1 g by mouth daily. 1200 mg    . fluticasone (FLONASE) 50 MCG/ACT nasal spray Place 1-2 sprays into both nostrils daily as needed for allergies.     [name] Kitchen HYDROcodone-homatropine (HYCODAN) 5-1.5 MG/5ML syrup Take 5 mLs by mouth every 6 (six) hours as needed. 120 mL 0  . Multiple Vitamin (MULTIVITAMIN WITH MINERALS) TABS tablet Take 1 tablet by mouth daily.    . nadolol (CORGARD) 20 MG tablet Take 1/2 tablet  Once dailyHOLD UNTIL FURTHER NOTIFIED BY CARDIOLOGY    . pravastatin (PRAVACHOL) 40 MG tablet Take 1 tablet (40 mg total) by mouth daily. Take 1/2 tab for first week then full tab. (Patient taking differently: Take 40 mg by mouth daily. Take 1/2 tab) 30 tablet 5  . telmisartan (MICARDIS) 80 MG tablet Take 1 tablet (80 mg total) by mouth daily. 90 tablet 1  . temazepam (RESTORIL) 15 MG capsule Take 1 capsule (15 mg total) by mouth at bedtime as needed for sleep. 30 capsule 3  . acetaminophen (TYLENOL) 500 MG tablet Take 500 mg by mouth daily as needed (for pain).     [name] Kitchen diphenoxylate-atropine (LOMOTIL) 2.5-0.025 MG per  tablet Take 1 tablet by mouth 4 (four) times daily as needed for diarrhea or loose stools.    [name] Kitchen NITROSTAT 0.4 MG SL tablet Place 0.4 mg under the tongue every 5 (five) minutes as needed.      Objective: BP 108/68 mmHg  Temp(Src) 98.1 F (36.7 C)  Wt 194 lb (87.998 kg)  Gen: NAD, resting comfortably HEENT: nares erythematous and swollen, mild erythema on pharynx normal without pharyngeal exudate, TM normal bilaterally, Mucous membranes are moist. Right maxillary sinus tenderness noted.  CV: RRR no murmurs rubs or gallops Lungs: CTAB no crackles, wheeze, rhonchi Ext: no edema, equal calf size with  no calf tenderness.  Skin: warm, dry, no rash Neuro: grossly normal, moves all extremities    Assessment/Plan:  Acute Bacterial Sinusitis Since I saw patient last and he took prednisone course, he has had worsening sinus tenderness and congestion over the last few days, this is concerning for suprainfection on his baseline URI/bronchitis/laryngitis syndrome with bacteria. Treat with azithromycin given amoxicillin alley. Given reasons for return. This could also be a different viral illness which he has contracted and we discussed potential for antibiotic overuse. Given 3 weeks of symptoms with worsening sinus symptoms, decision still made to treat.   Meds ordered this encounter  Medications  . fluticasone (FLONASE) 50 MCG/ACT nasal spray    Sig: Place 1-2 sprays into both nostrils daily as needed for allergies.    Dispense:  16 g    Refill:  5  . azithromycin (ZITHROMAX) 250 MG tablet    Sig: Take 2 tabs on day 1, then 1 tab daily until finished    Dispense:  6 tablet    Refill:  0

## 2014-12-11 ENCOUNTER — Ambulatory Visit: Payer: Self-pay | Admitting: Family Medicine

## 2014-12-19 ENCOUNTER — Telehealth: Payer: Self-pay | Admitting: Family Medicine

## 2014-12-19 NOTE — Telephone Encounter (Signed)
Pt's sinus infection has returned, and pt would like another round of antibiotics.  Pt has appt next Monday for a fup [facility] [address]

## 2014-12-20 NOTE — Telephone Encounter (Signed)
Please advise 

## 2014-12-20 NOTE — Telephone Encounter (Signed)
Would need repeat office visit to evaluate fully

## 2014-12-25 ENCOUNTER — Encounter: Payer: Self-pay | Admitting: Family Medicine

## 2014-12-25 ENCOUNTER — Ambulatory Visit (INDEPENDENT_AMBULATORY_CARE_PROVIDER_SITE_OTHER): Payer: Medicare Other | Admitting: Family Medicine

## 2014-12-25 ENCOUNTER — Telehealth: Payer: Self-pay | Admitting: Family Medicine

## 2014-12-25 VITALS — BP 132/88 | Temp 97.7°F | Wt 196.0 lb

## 2014-12-25 DIAGNOSIS — E538 Deficiency of other specified B group vitamins: Secondary | ICD-10-CM

## 2014-12-25 DIAGNOSIS — B9689 Other specified bacterial agents as the cause of diseases classified elsewhere: Secondary | ICD-10-CM

## 2014-12-25 DIAGNOSIS — D649 Anemia, unspecified: Secondary | ICD-10-CM

## 2014-12-25 DIAGNOSIS — J019 Acute sinusitis, unspecified: Secondary | ICD-10-CM

## 2014-12-25 DIAGNOSIS — I10 Essential (primary) hypertension: Secondary | ICD-10-CM

## 2014-12-25 DIAGNOSIS — E785 Hyperlipidemia, unspecified: Secondary | ICD-10-CM

## 2014-12-25 LAB — CBC
HCT: 36.9 % — ABNORMAL LOW (ref 39.0–52.0)
Hemoglobin: 12 g/dL — ABNORMAL LOW (ref 13.0–17.0)
MCHC: 32.5 g/dL (ref 30.0–36.0)
MCV: 78.9 fl (ref 78.0–100.0)
Platelets: 201 10*3/uL (ref 150.0–400.0)
RBC: 4.68 Mil/uL (ref 4.22–5.81)
RDW: 17 % — ABNORMAL HIGH (ref 11.5–15.5)
WBC: 5.2 10*3/uL (ref 4.0–10.5)

## 2014-12-25 LAB — LDL CHOLESTEROL, DIRECT: Direct LDL: 100 mg/dL

## 2014-12-25 MED ORDER — CYANOCOBALAMIN 1000 MCG/ML IJ SOLN
1000.0000 ug | Freq: Once | INTRAMUSCULAR | Status: AC
  Administered 2014-12-25: 1000 ug via INTRAMUSCULAR

## 2014-12-25 NOTE — Telephone Encounter (Signed)
I received a PA request for Nadolol, Sinclair Grooms, MD in Cardiology is the original prescriber, do you want me to submit the PA or does it need to go to cardiology?

## 2014-12-25 NOTE — Assessment & Plan Note (Signed)
Check direct LDL today, may titrate up to 40mg . Hopeful ldl <100.

## 2014-12-25 NOTE — Patient Instructions (Signed)
Update labs today. Hope your bad cholesterol is less than 100.   Blood pressure looks great  For sleep medicine-call your insurance and ask them what is covered. Have them call us. If you dont hear within a week, give [place] a call and tell us the names of medicines they would cover.   Glad the sinus pressure has finally gotten better. It may come and go. As long as not persistent for more than a week, let's hold off on antibiotics. Come see me for any antibiotic decisions as far as starting.

## 2014-12-25 NOTE — Telephone Encounter (Signed)
[place] looks like the nadolol would need to go to cardiology.   [place], In regards to sleep medicine, let's call in ambien 5mg  [facility] prn #30 5 refills

## 2014-12-25 NOTE — Telephone Encounter (Signed)
Pt called to say that his insurance will only cover the following meds.   silenor zolppideem zaleplon  Pt said his insurance will no longer cover  Nadolol   Pharmacy ; [address]

## 2014-12-25 NOTE — Assessment & Plan Note (Signed)
Controlled on Nadolol 20mg , telmisartan 80mg 

## 2014-12-25 NOTE — Telephone Encounter (Signed)
The PA for temazepam was denied.  Patient must try and fail 2 of the formulary alternatives:  Zolpidem, silenor, or zaleplon.

## 2014-12-25 NOTE — Telephone Encounter (Signed)
See below

## 2014-12-25 NOTE — Assessment & Plan Note (Signed)
Check cbc again today.

## 2014-12-25 NOTE — Progress Notes (Signed)
[name], MD Phone: [phone]  Subjective:   Timothy Gallagher is a 79 y.o. year old very pleasant male patient who presents with the following:  Hypertension-controlled  BP Readings from Last 3 Encounters:  12/25/14 132/88  12/04/14 108/68  11/27/14 136/84  Home BP monitoring-no Compliant with medications-yes without side effects ROS-Denies any CP, SOB, blurry vision, LE edema.  Hyperlipidemia-poor control on last check  On statin: pravastatin 20mg  only Regular exercise: no, advised to increase slowly ROS- no chest pain or shortness of breath. No myalgias  Anemia-post operatively after CABG, improved on last check ROS- denies fatigue or shortness of breath or chest pain  Sinusitis-improved 12/04/14 Symptoms significantly improved on azithromycin. 2-3 days later symptoms returned including: severe sinus pressure on left side, some drainage.  After sleeping last night, symptoms almost completely resolved.  ROS- no fever, chills, minimal sinus pressure.   Past Medical History- Patient Active Problem List   Diagnosis Date Noted  . CAD (coronary artery disease) s/p CABG x4 05/19/2014    Priority: High  . Insomnia 11/22/2014    Priority: Medium  . Anemia 07/20/2014    Priority: Medium  . BPH (benign prostatic hyperplasia) 12/05/2013    Priority: Medium  . Hyperlipidemia 03/13/2008    Priority: Medium  . B12 DEFICIENCY 06/07/2007    Priority: Medium  . Essential hypertension 04/09/2007    Priority: Medium  . Palpitations 02/11/2013    Priority: Low  . OSTEOARTHRITIS, WRIST, RIGHT 08/05/2010    Priority: Low  . ACNE ROSACEA 06/27/2009    Priority: Low  . ESOPHAGEAL STRICTURE 04/27/2009    Priority: Low  . ACTINIC KERATOSIS, HEAD 04/18/2009    Priority: Low  . NECK PAIN 09/14/2008    Priority: Low  . NEUROPATHY, IDIOPATHIC PERIPHERAL NEC 08/11/2007    Priority: Low  . Irritable bowel syndrome 08/11/2007    Priority: Low  . ALLERGIC RHINITIS 04/09/2007   Priority: Low   Medications- reviewed and updated Current Outpatient Prescriptions  Medication Sig Dispense Refill  . aspirin EC 325 MG EC tablet Take 1 tablet (325 mg total) by mouth daily.    . cetirizine (ZYRTEC) 10 MG tablet Take 10 mg by mouth daily as needed for allergies.     . cyanocobalamin (,VITAMIN B-12,) 1000 MCG/ML injection Inject 1.71ml every 3 weeks 10 mL 3  . diphenoxylate-atropine (LOMOTIL) 2.5-0.025 MG per tablet Take 1 tablet by mouth 4 (four) times daily as needed for diarrhea or loose stools.    . fish oil-omega-3 fatty acids 1000 MG capsule Take 1 g by mouth daily. 1200 mg    . Multiple Vitamin (MULTIVITAMIN WITH MINERALS) TABS tablet Take 1 tablet by mouth daily.    . nadolol (CORGARD) 20 MG tablet Take 1 tablet (20 mg total) by mouth 2 (two) times daily. 60 tablet 11  . pravastatin (PRAVACHOL) 40 MG tablet Take 1 tablet (40 mg total) by mouth daily. Take 1/2 tab for first week then full tab. (Patient taking differently: Take 40 mg by mouth daily. Take 1/2 tab) 30 tablet 5  . telmisartan (MICARDIS) 80 MG tablet Take 1 tablet (80 mg total) by mouth daily. 90 tablet 1  . temazepam (RESTORIL) 15 MG capsule Take 1 capsule (15 mg total) by mouth at bedtime as needed for sleep. 30 capsule 3  . acetaminophen (TYLENOL) 500 MG tablet Take 500 mg by mouth daily as needed (for pain).     . fluticasone (FLONASE) 50 MCG/ACT nasal spray Place 1-2 sprays into both  nostrils daily as needed for allergies. (Patient not taking: Reported on 12/25/2014) 16 g 5  . NITROSTAT 0.4 MG SL tablet Place 0.4 mg under the tongue every 5 (five) minutes as needed for chest pain.      No current facility-administered medications for this visit.    Objective: BP 132/88 mmHg  Temp(Src) 97.7 F (36.5 C)  Wt 196 lb (88.905 kg) Gen: NAD, resting comfortably in chair Turbinates erythematous without drainage, very mild sinus tenderness on L, oropharynx normal CV: RRR no murmurs rubs or gallops Lungs: CTAB  no crackles, wheeze, rhonchi Abdomen: soft/nontender/nondistended/normal bowel sounds.  Ext: no edema Skin: warm, dry, no rash  Neuro: grossly normal, moves all extremities   Assessment/Plan:  Essential hypertension Controlled on Nadolol 20mg , telmisartan 80mg    Hyperlipidemia Check direct LDL today, may titrate up to 40mg . Hopeful ldl <100.    Anemia Check cbc again today.    Sinusitis bacterial-greatly improved after azithromycin then had some recurrent left sided sinus pressure which resolved last night. We discussed wanting to avoid antibiotic resistance and unless had persistent symptoms. No further antibiotics. All decisions should eb made in office.   Return precautions advised.   Orders Placed This Encounter  Procedures  . CBC    [place]  . LDL cholesterol, direct    [place]    Meds ordered this encounter  Medications  . cyanocobalamin ((VITAMIN B-12)) injection 1,000 mcg    Sig:

## 2014-12-26 MED ORDER — ZOLPIDEM TARTRATE 5 MG PO TABS: 5.0000 mg | ORAL_TABLET | Freq: Every evening | ORAL | Status: DC | PRN

## 2014-12-26 NOTE — Telephone Encounter (Signed)
Medication sent in. 

## 2015-01-15 ENCOUNTER — Ambulatory Visit (INDEPENDENT_AMBULATORY_CARE_PROVIDER_SITE_OTHER): Payer: Medicare Other | Admitting: Family Medicine

## 2015-01-15 DIAGNOSIS — E538 Deficiency of other specified B group vitamins: Secondary | ICD-10-CM

## 2015-01-15 MED ORDER — CYANOCOBALAMIN 1000 MCG/ML IJ SOLN
1000.0000 ug | Freq: Once | INTRAMUSCULAR | Status: AC
  Administered 2015-01-15: 1000 ug via INTRAMUSCULAR

## 2015-01-18 ENCOUNTER — Telehealth: Payer: Self-pay | Admitting: Interventional Cardiology

## 2015-01-18 NOTE — Telephone Encounter (Signed)
New message      Nadolol is not longer on formulary---please select  Propranolol (if possible) so that [place] will pay.  [facility].  Pt will not need another refill for approx 3 wks.  If any problems, please call pt at home.

## 2015-01-18 NOTE — Telephone Encounter (Signed)
Routed to Dr.Smith for approval 

## 2015-01-18 NOTE — Telephone Encounter (Signed)
Please inquire why we use the nadolol? If it is cardiac, I'd prefer metoprolol succinate 26 mg daily. If for tremor or some other cause, let me know. I did not prescribe Nadolol.

## 2015-01-19 ENCOUNTER — Telehealth: Payer: Self-pay | Admitting: Family Medicine

## 2015-01-19 MED ORDER — METOPROLOL SUCCINATE ER 25 MG PO TB24: 25.0000 mg | ORAL_TABLET | Freq: Every day | ORAL | Status: DC

## 2015-01-19 NOTE — Telephone Encounter (Signed)
I changed it. Please inform patient. I gave #90 but you can change to #30 if he desires

## 2015-01-19 NOTE — Telephone Encounter (Signed)
Is this change ok? 

## 2015-01-19 NOTE — Telephone Encounter (Signed)
Pt.notified

## 2015-01-19 NOTE — Telephone Encounter (Signed)
Returned pt call. Pt has been taking nadolol for hypertension, the cost has gone up to $28mo. Pt given Dr.Smith recommendation to switch to Metoprolol Succ 25mg  daily. He sts that his pcp normal writes his prescriptions. He has an appt with him and will talk with him about new script, so that one physician would be responsible for filling his medication. No further assistance needed at this time.

## 2015-01-19 NOTE — Telephone Encounter (Addendum)
Pt can not afford nadolol. Pt talk with dr Daneen Schick  His cardiologist and he recommend  metoprolol 25 mg #30 with refills [facility]

## 2015-01-19 NOTE — Telephone Encounter (Signed)
Follow up      Pt is calling to see if Dr [name] called in a presc at [facility].  He said they will not pay for nadolol.  Is he going to call in something else?

## 2015-01-22 ENCOUNTER — Ambulatory Visit (INDEPENDENT_AMBULATORY_CARE_PROVIDER_SITE_OTHER): Payer: Medicare Other | Admitting: Internal Medicine

## 2015-01-22 ENCOUNTER — Encounter: Payer: Self-pay | Admitting: Internal Medicine

## 2015-01-22 VITALS — BP 148/78 | HR 68 | Ht 71.75 in | Wt 198.0 lb

## 2015-01-22 DIAGNOSIS — K644 Residual hemorrhoidal skin tags: Secondary | ICD-10-CM | POA: Insufficient documentation

## 2015-01-22 DIAGNOSIS — K589 Irritable bowel syndrome without diarrhea: Secondary | ICD-10-CM

## 2015-01-22 DIAGNOSIS — K648 Other hemorrhoids: Secondary | ICD-10-CM

## 2015-01-22 HISTORY — DX: Other hemorrhoids: K64.8

## 2015-01-22 NOTE — Progress Notes (Signed)
   Subjective:    Patient ID: Timothy Gallagher, male    DOB: January 31, 1936, 79 y.o.   MRN: [id number] Cc: rectal bleeeding HPI The patient is here for follow-up with a new problem. He previously had diarrhea predominant irritable bowel syndrome and had a colonoscopy with negative findings in 2011 including random biopsies. He had coronary artery bypass grafting in July 2015 and since then he has moved towards one bowel movement a day sometimes formed or firm. If he strains or struggles to defecate he will sometimes see a very small streak of blood on the toilet paper or on the end of the stool. There is been no progressive change in stool caliber or unintentional weight loss. He is anemic but this is been a chronic low-grade thing, and is Axid back to baseline with a hemoglobin in the 12 range after having significant postoperative anemia last year.  He says he is not back to full strength after the bypass surgery but it feels like he is moving in that direction.  He says his medications did not change after the bypass surgery. He does eat a high-fiber diet with lots of fruits and vegetables he believes.  Medications, allergies, past medical history, past surgical history, family history and social history are reviewed and updated in the EMR.   Review of Systems As per history of present illness    Objective:   Physical Exam BP 148/78 mmHg  Pulse 68  Ht 5' 11.75" (1.822 m)  Wt 198 lb (89.812 kg)  BMI 27.05 kg/m2  He is alert and oriented and in appropriate mood and affect. Abdomen is soft.  Rectal: Anoderm inspection revealed normal findings Digital exam revealed normal resting tone and voluntary squeeze. No mass or rectocele present. No tenderness  Anoscopy was performed with the patient in the left lateral decubitus position and revealed Grade 1 internal hemorrhoids in all positions. No fissure.  Data reviewed as above. I looked at labs in the EMR.    Assessment & Plan:   1.  Internal bleeding hemorrhoids   2. IBS (irritable bowel syndrome)    I think he has moved towards a constipation-type phase of IBS after his bypass surgery, it is known IBS patients can switch between types of IBS. The slight bleeding is having can be attributed to the hemorrhoids are sought anoscopy today. I don't think an endoscopic evaluation is warranted at this point.  He will add Benefiber 2 tablespoons daily and follow-up in 2 months. I think if we alleviate his stools draining that will fix this problem. Banding of hemorrhoids is a potential option I would not do that at this point.  I appreciate the opportunity to care for this patient.

## 2015-01-22 NOTE — Patient Instructions (Addendum)
Today you have been given a handout on benefiber to read and follow.  We are providing you with a handout on hemorrhoids to read today.   Follow up with [place] in 2 months.  Patient to call back and set up appointment.    I appreciate the opportunity to care for you. Silvano Rusk, M.D., [facility]

## 2015-01-23 ENCOUNTER — Telehealth: Payer: Self-pay | Admitting: Internal Medicine

## 2015-01-24 NOTE — Telephone Encounter (Signed)
Patient reports that yesterday he had nausea and wanted to know if it was coming from the fiber supplement.  He is advised very unlikely, but to wait another day or two and try again at half dose. He is advised to call back for any additional questions or concerns

## 2015-02-05 ENCOUNTER — Ambulatory Visit (INDEPENDENT_AMBULATORY_CARE_PROVIDER_SITE_OTHER): Payer: Medicare Other | Admitting: Family Medicine

## 2015-02-05 ENCOUNTER — Encounter: Payer: Self-pay | Admitting: Family Medicine

## 2015-02-05 ENCOUNTER — Ambulatory Visit: Payer: Medicare Other | Admitting: Family Medicine

## 2015-02-05 VITALS — BP 136/86 | Temp 97.7°F | Wt 199.0 lb

## 2015-02-05 DIAGNOSIS — Z23 Encounter for immunization: Secondary | ICD-10-CM

## 2015-02-05 DIAGNOSIS — R110 Nausea: Secondary | ICD-10-CM

## 2015-02-05 DIAGNOSIS — E539 Vitamin B deficiency, unspecified: Secondary | ICD-10-CM

## 2015-02-05 LAB — CBC WITH DIFFERENTIAL/PLATELET
Basophils Absolute: 0 10*3/uL (ref 0.0–0.1)
Basophils Relative: 0.4 % (ref 0.0–3.0)
Eosinophils Absolute: 0.2 10*3/uL (ref 0.0–0.7)
Eosinophils Relative: 4.5 % (ref 0.0–5.0)
HCT: 37.2 % — ABNORMAL LOW (ref 39.0–52.0)
Hemoglobin: 12 g/dL — ABNORMAL LOW (ref 13.0–17.0)
Lymphocytes Relative: 36.7 % (ref 12.0–46.0)
Lymphs Abs: 2 10*3/uL (ref 0.7–4.0)
MCHC: 32.2 g/dL (ref 30.0–36.0)
MCV: 78.2 fl (ref 78.0–100.0)
Monocytes Absolute: 0.5 10*3/uL (ref 0.1–1.0)
Monocytes Relative: 9.7 % (ref 3.0–12.0)
Neutro Abs: 2.6 10*3/uL (ref 1.4–7.7)
Neutrophils Relative %: 48.7 % (ref 43.0–77.0)
Platelets: 198 10*3/uL (ref 150.0–400.0)
RBC: 4.76 Mil/uL (ref 4.22–5.81)
RDW: 15.6 % — ABNORMAL HIGH (ref 11.5–15.5)
WBC: 5.3 10*3/uL (ref 4.0–10.5)

## 2015-02-05 LAB — COMPREHENSIVE METABOLIC PANEL
ALT: 11 U/L (ref 0–53)
AST: 19 U/L (ref 0–37)
Albumin: 3.7 g/dL (ref 3.5–5.2)
Alkaline Phosphatase: 71 U/L (ref 39–117)
BUN: 20 mg/dL (ref 6–23)
CO2: 27 mEq/L (ref 19–32)
Calcium: 9.4 mg/dL (ref 8.4–10.5)
Chloride: 109 mEq/L (ref 96–112)
Creatinine, Ser: 0.86 mg/dL (ref 0.40–1.50)
GFR: 91.75 mL/min (ref >60.00)
Glucose, Bld: 91 mg/dL (ref 70–99)
Potassium: 5.2 mEq/L — ABNORMAL HIGH (ref 3.5–5.1)
Sodium: 142 mEq/L (ref 135–145)
Total Bilirubin: 0.3 mg/dL (ref 0.2–1.2)
Total Protein: 7.1 g/dL (ref 6.0–8.3)

## 2015-02-05 LAB — LIPASE: Lipase: 31 U/L (ref 11.0–59.0)

## 2015-02-05 MED ORDER — CYANOCOBALAMIN 1000 MCG/ML IJ SOLN
1000.0000 ug | Freq: Once | INTRAMUSCULAR | Status: AC
  Administered 2015-02-05: 1000 ug via INTRAMUSCULAR

## 2015-02-05 NOTE — Patient Instructions (Addendum)
Received PNEUMOVAX today and B12 injection.  Stop benefiber completely  Update me in a week  Update bloodwork today  I will message Dr. [name] and see if he has any other thoughts or if we can get a gameplan if symptoms dont resolve

## 2015-02-05 NOTE — Progress Notes (Signed)
Timothy Reddish, MD Phone: [phone]  Subjective:   Timothy Gallagher is a 79 y.o. year old very pleasant male patient who presents with the following:  Nausea Seen by GI 01/22/15 and started benefiber later that day for constipation leading to hemorrhoids/brbpr with wiping at times.   He developed nausea after the first dose of benefiber and called GI and was told to 1/2 dose although that reaction would be low likelihood.   Patient has continued to have nausea each of the last 2 weeks in the AM. Lasting 2-3 hours. He will feel slightly nauseous and at times mildly lightheaded. When he first wakes up denies palpitations but states feels like HR slightly higher. Symptoms resolve after coffee and eating at times and at others after 2-3 hours. Denies abdominal pain or emesis.   Last night he tried benefiber in PM instead of morning and this morning he woke up with worsening nausea. He had a bowel movement within a few hours and symptoms presently completely gone. Stools were like hard pellets this AM but usually just formed.   He states in the morning he seems to have some sinus drainage and wonders if that is making him sick to his stomach. He denies other new medications. He is complaint with flonase and zyrtec for his allergies. Does not use neti pot.   ROS- no weight loss, worsening fatigue. Denies chest pain, epigastric pain with episodes.   Past Medical History- CAD, insomnia, mild anemia, BPH, HLD, b12 def, HTN, IBS  Medications- reviewed and updated Current Outpatient Prescriptions  Medication Sig Dispense Refill  . aspirin EC 325 MG EC tablet Take 1 tablet (325 mg total) by mouth daily.    . cetirizine (ZYRTEC) 10 MG tablet Take 10 mg by mouth daily as needed for allergies.     . cyanocobalamin (,VITAMIN B-12,) 1000 MCG/ML injection Inject 1.10ml every 3 weeks 10 mL 3  . fish oil-omega-3 fatty acids 1000 MG capsule Take 1 g by mouth daily. 1200 mg    . metoprolol succinate  (TOPROL-XL) 25 MG 24 hr tablet Take 1 tablet (25 mg total) by mouth daily. 90 tablet 3  . Multiple Vitamin (MULTIVITAMIN WITH MINERALS) TABS tablet Take 1 tablet by mouth daily.    . pravastatin (PRAVACHOL) 40 MG tablet Take 1 tablet (40 mg total) by mouth daily. Take 1/2 tab for first week then full tab. (Patient taking differently: Take 40 mg by mouth daily. Take 1/2 tab) 30 tablet 5  . telmisartan (MICARDIS) 80 MG tablet Take 1 tablet (80 mg total) by mouth daily. 90 tablet 1  . acetaminophen (TYLENOL) 500 MG tablet Take 500 mg by mouth daily as needed (for pain).     . fluticasone (FLONASE) 50 MCG/ACT nasal spray Place 1-2 sprays into both nostrils daily as needed for allergies. (Patient not taking: Reported on 02/05/2015) 16 g 5  . NITROSTAT 0.4 MG SL tablet Place 0.4 mg under the tongue every 5 (five) minutes as needed for chest pain.     [name] Kitchen zolpidem (AMBIEN) 5 MG tablet Take 1 tablet (5 mg total) by mouth at bedtime as needed for sleep. (Patient not taking: Reported on 02/05/2015) 30 tablet 5   No current facility-administered medications for this visit.    Objective: BP 136/86 mmHg  Temp(Src) 97.7 F (36.5 C)  Wt 199 lb (90.266 kg) Gen: NAD, resting comfortably in chair Ears normal, oropharynx normal and no obvious cobblestoning, nares without notable drainage. No sinus pressure.  CV: RRR  no murmurs rubs or gallops Lungs: CTAB no crackles, wheeze, rhonchi Abdomen: soft/nontender/nondistended/normal bowel sounds. No rebound or guarding.  Ext: no edema Skin: warm, dry, no rash over abdomen or back   Assessment/Plan:  Nausea in patient with IBS (formerly diarrhea predominant now constipation predominenat)  Seemed to start after benefiber. Although this would be an odd relationship, we have opted for a 1 week trial off of medication then he will udpate me. Patient wonders if sinus drainage contributing but already on zyrtec and flonase. We will add neti pot daily although I doubt  drainage is the cause here. On the other hand, constipation possibly could be causing symptoms. I will reach out to Dr. [name] to see if he has any other thoughts. Basic bloodwork ordered below.   Return precautions advised.   Orders Placed This Encounter  Procedures  . Pneumococcal polysaccharide vaccine 23-valent greater than or equal to 2yo subcutaneous/IM  . CBC with Differential/Platelet  . Comprehensive metabolic panel    [name]  . Lipase   Meds ordered this encounter  Medications  . cyanocobalamin ((VITAMIN B-12)) injection 1,000 mcg    Sig:

## 2015-02-08 NOTE — Progress Notes (Signed)
I had intended to see him 2 months after last visit but not scheduled so he should scheduke a f/u.  Agree with your thoughts and plans.Miralax is next after Benefiber Nausea is always tough - I find its often meds or emotional in origin (anxiety, etc) more than GI when chronic.  I would have him stop fish oil also as I have seen GI upset with that and its probably not helping him that much anyway re: lipids I would bet

## 2015-02-13 NOTE — Progress Notes (Signed)
I had missed Dr. Celesta Aver note. I called patient today and nausea completely resolved off Benefiber. He is occasionally having hard stools. We agreed to using miralax if he does not have bowel movement in 2 days. He will stop his fish oil he has nausea again.

## 2015-02-13 NOTE — Progress Notes (Signed)
Message forwarded to Dr. Celesta Aver nurse

## 2015-02-19 ENCOUNTER — Encounter: Payer: Self-pay | Admitting: Family Medicine

## 2015-02-19 ENCOUNTER — Ambulatory Visit (INDEPENDENT_AMBULATORY_CARE_PROVIDER_SITE_OTHER): Payer: Medicare Other | Admitting: Family Medicine

## 2015-02-19 VITALS — BP 130/80 | HR 60 | Temp 97.9°F | Wt 198.0 lb

## 2015-02-19 DIAGNOSIS — R109 Unspecified abdominal pain: Secondary | ICD-10-CM

## 2015-02-19 NOTE — Patient Instructions (Signed)
This could be a muscle strain. i think continuing tylenol, heat as those help is reasonable.   You can also trial tums as that has helped on other days.   If your symptoms worsen over the next week or persist over next 2 weeks, we will pursue labs and imaging(cbc diff, cmet, lipase, abdominal u/s). I think this is going to ease away with time.   Call for fevers, worsening or new symptoms.

## 2015-02-19 NOTE — Progress Notes (Signed)
[name], MD Phone: [phone]  Subjective:   Timothy Gallagher is a 79 y.o. year old very pleasant male patient who presents with the following:  Right sided abdominal pain -Last Monday started with right sided abdominal pain from lower to upper abdomen. Bloating throughout the right side is what it feels like. Pain about 7/10 dull ache. Got up around 4 am and has been getting up almost everynight similarly. Eases off by at 1-2 pm each day. Wonders if he picked up heavy groceries that he was not supposed to.  Nausea when has pain but no other time of the day. Having daily bowel movements without any medication. No particular foods that make this worse.   Recently seen for nausea which resolved off benefiber. Miralax if no BM within 2 days was the plan but he did not take any. Off fish oil.   ROS- no vomiting, diarrhea, constipation. No chest pain or shortness of breath.   Past Medical History- Patient Active Problem List   Diagnosis Date Noted  . CAD (coronary artery disease) s/p CABG x4 05/19/2014    Priority: High  . Insomnia 11/22/2014    Priority: Medium  . Anemia 07/20/2014    Priority: Medium  . BPH (benign prostatic hyperplasia) 12/05/2013    Priority: Medium  . Hyperlipidemia 03/13/2008    Priority: Medium  . B12 DEFICIENCY 06/07/2007    Priority: Medium  . Essential hypertension 04/09/2007    Priority: Medium  . Palpitations 02/11/2013    Priority: Low  . OSTEOARTHRITIS, WRIST, RIGHT 08/05/2010    Priority: Low  . ACNE ROSACEA 06/27/2009    Priority: Low  . ESOPHAGEAL STRICTURE 04/27/2009    Priority: Low  . ACTINIC KERATOSIS, HEAD 04/18/2009    Priority: Low  . NECK PAIN 09/14/2008    Priority: Low  . NEUROPATHY, IDIOPATHIC PERIPHERAL NEC 08/11/2007    Priority: Low  . Irritable bowel syndrome 08/11/2007    Priority: Low  . ALLERGIC RHINITIS 04/09/2007    Priority: Low  . Internal bleeding hemorrhoids 01/22/2015   Medications- reviewed and  updated Current Outpatient Prescriptions  Medication Sig Dispense Refill  . aspirin EC 325 MG EC tablet Take 1 tablet (325 mg total) by mouth daily.    . cetirizine (ZYRTEC) 10 MG tablet Take 10 mg by mouth daily as needed for allergies.     . cyanocobalamin (,VITAMIN B-12,) 1000 MCG/ML injection Inject 1.55ml every 3 weeks 10 mL 3  . fish oil-omega-3 fatty acids 1000 MG capsule Take 1 g by mouth daily. 1200 mg    . metoprolol succinate (TOPROL-XL) 25 MG 24 hr tablet Take 1 tablet (25 mg total) by mouth daily. 90 tablet 3  . Multiple Vitamin (MULTIVITAMIN WITH MINERALS) TABS tablet Take 1 tablet by mouth daily.    . pravastatin (PRAVACHOL) 40 MG tablet Take 1 tablet (40 mg total) by mouth daily. Take 1/2 tab for first week then full tab. (Patient taking differently: Take 40 mg by mouth daily. Take 1/2 tab) 30 tablet 5  . telmisartan (MICARDIS) 80 MG tablet Take 1 tablet (80 mg total) by mouth daily. 90 tablet 1  . zolpidem (AMBIEN) 5 MG tablet Take 1 tablet (5 mg total) by mouth at bedtime as needed for sleep. 30 tablet 5  . acetaminophen (TYLENOL) 500 MG tablet Take 500 mg by mouth daily as needed (for pain).     . fluticasone (FLONASE) 50 MCG/ACT nasal spray Place 1-2 sprays into both nostrils daily as needed for  allergies. (Patient not taking: Reported on 02/05/2015) 16 g 5  . NITROSTAT 0.4 MG SL tablet Place 0.4 mg under the tongue every 5 (five) minutes as needed for chest pain.      Objective: BP 130/80 mmHg  Pulse 60  Temp(Src) 97.9 F (36.6 C)  Wt 198 lb (89.812 kg) Gen: NAD, resting comfortably in chair CV: RRR no murmurs rubs or gallops Lungs: CTAB no crackles, wheeze, rhonchi Abdomen: soft/mild tenderness throughout R side and in epigastric area/nondistended/normal bowel sounds. No rebound or guarding.  Ext: no edema Skin: warm, dry, no rash over abdomen   Assessment/Plan:  Right sided abdominal pain Intermittent but recurrent around 4 am to 2 pm each day associated with  nausea but then resolves. Tylenol, heat, or tums all help. Atypical picture here-could be muscle strain as patient suspects, could be GERD with atypical time course. Cholecystitis or appendicitis I think are low likelihood and benign abdominal exam. We discussed bloodwork and abdominal u/s as per AVS but I honestly think this is not likely dangerous to patient. Since his CABG, he has had heightened sensitivity to many issues and I think anxiety is playing a role here.   We discussed we would plan on above workup if no resolution within 2 weeks or if he had worsening symptoms. He sees [facility] for b12 injection next week and will update her.

## 2015-02-26 ENCOUNTER — Ambulatory Visit (INDEPENDENT_AMBULATORY_CARE_PROVIDER_SITE_OTHER): Payer: Medicare Other | Admitting: Family Medicine

## 2015-02-26 DIAGNOSIS — E538 Deficiency of other specified B group vitamins: Secondary | ICD-10-CM | POA: Diagnosis not present

## 2015-02-26 MED ORDER — CYANOCOBALAMIN 1000 MCG/ML IJ SOLN
1000.0000 ug | Freq: Once | INTRAMUSCULAR | Status: AC
  Administered 2015-02-26: 1000 ug via INTRAMUSCULAR

## 2015-03-05 ENCOUNTER — Telehealth: Payer: Self-pay | Admitting: Family Medicine

## 2015-03-05 DIAGNOSIS — R109 Unspecified abdominal pain: Secondary | ICD-10-CM

## 2015-03-05 NOTE — Telephone Encounter (Signed)
Pt is still right sided pain. Had pain all day yesterday but has not had any so far today. Thought you mentioned have some xrays if he was not feeling better.

## 2015-03-05 NOTE — Telephone Encounter (Signed)
US ordered

## 2015-03-05 NOTE — Telephone Encounter (Signed)
Would you like to proceed with the abd u/s that you mentioned in the last OV note?

## 2015-03-05 NOTE — Telephone Encounter (Signed)
Yes please order

## 2015-03-09 ENCOUNTER — Ambulatory Visit
Admission: RE | Admit: 2015-03-09 | Discharge: 2015-03-09 | Disposition: A | Discharge status: Home / Self Care | Payer: Medicare Other | Source: Ambulatory Visit | Attending: Family Medicine | Admitting: Family Medicine

## 2015-03-09 ENCOUNTER — Telehealth: Payer: Self-pay | Admitting: Family Medicine

## 2015-03-09 DIAGNOSIS — R109 Unspecified abdominal pain: Secondary | ICD-10-CM

## 2015-03-09 NOTE — Telephone Encounter (Signed)
Pt returning your call Please do not add the [phone] area code to pt's phone number. His number does not Change until tomorrow.

## 2015-03-09 NOTE — Telephone Encounter (Signed)
Returned call and pt transferred to scheduling.

## 2015-03-12 ENCOUNTER — Other Ambulatory Visit: Payer: Self-pay | Admitting: Family Medicine

## 2015-03-19 ENCOUNTER — Ambulatory Visit (INDEPENDENT_AMBULATORY_CARE_PROVIDER_SITE_OTHER): Payer: Medicare Other | Admitting: Family Medicine

## 2015-03-19 ENCOUNTER — Encounter: Payer: Self-pay | Admitting: Family Medicine

## 2015-03-19 ENCOUNTER — Ambulatory Visit: Payer: Medicare Other | Admitting: Family Medicine

## 2015-03-19 VITALS — BP 148/82 | HR 62 | Temp 98.0°F | Wt 198.0 lb

## 2015-03-19 DIAGNOSIS — E538 Deficiency of other specified B group vitamins: Secondary | ICD-10-CM | POA: Diagnosis not present

## 2015-03-19 DIAGNOSIS — R109 Unspecified abdominal pain: Secondary | ICD-10-CM

## 2015-03-19 MED ORDER — CYANOCOBALAMIN 1000 MCG/ML IJ SOLN
1000.0000 ug | Freq: Once | INTRAMUSCULAR | Status: AC
  Administered 2015-03-19: 1000 ug via INTRAMUSCULAR

## 2015-03-19 NOTE — Progress Notes (Signed)
[name], MD  Subjective:  Timothy Gallagher is a 79 y.o. year old very pleasant male patient who presents with:  Right sided abdominal and rib pain -from last visit, "Last Monday started with right sided abdominal pain from lower to upper abdomen. Bloating throughout the right side is what it feels like. Pain about 7/10 dull ache. Got up around 4 am and has been getting up almost everynight similarly. Eases off by at 1-2 pm each day. Wonders if he picked up heavy groceries that he was not supposed to. Nausea when has pain but no other time of the day. Having daily bowel movements without any medication. No particular foods that make this worse. Recently seen for nausea which resolved off benefiber. Miralax if no BM within 2 days was the plan but he did not take any. Off fish oil.  Right sided abdominal pain Intermittent but recurrent around 4 am to 2 pm each day associated with nausea but then resolves. Tylenol, heat, or tums all help. Atypical picture here-could be muscle strain as patient suspects, could be GERD with atypical time course. Cholecystitis or appendicitis I think are low likelihood and benign abdominal exam. We discussed bloodwork and abdominal u/s as per AVS but I honestly think this is not likely dangerous to patient. Since his CABG, he has had heightened sensitivity to many issues and I think anxiety is playing a role here.   We discussed we would plan on above workup if no resolution within 2 weeks or if he had worsening symptoms. He sees [facility] for b12 injection next week and will update her.  "   Interval history:  -[place] of abdomen showed stable simple renal cyst with no change from 2014, possible fatty infiltration of liver (fatty liver) or hepatocellular disease -tums not helping -can be all day now when previously just at night -seems to get worse after overexerting himself like when he mowed his neighbors lawn or if he lifts something heavy -only mild pain today -pain  can be severe up to 8/10 -no radiation of pain outside of abdomen and right side of ribs -no relation with foods he is eating -pain relieved by tylenol if used  ROS- no weight loss, nausea, vomiting, constipation, diarrhea  Past Medical History- CAD s/p CABG, HLD, HTN, BPH, insomnia  Medications- reviewed and updated Current Outpatient Prescriptions  Medication Sig Dispense Refill  . aspirin EC 325 MG EC tablet Take 1 tablet (325 mg total) by mouth daily.    . cetirizine (ZYRTEC) 10 MG tablet Take 10 mg by mouth daily as needed for allergies.     . cyanocobalamin (,VITAMIN B-12,) 1000 MCG/ML injection Inject 1.26ml every 3 weeks 10 mL 3  . fish oil-omega-3 fatty acids 1000 MG capsule Take 1 g by mouth daily. 1200 mg    . Melatonin 10 MG TABS Take 10 mg by mouth.    . metoprolol succinate (TOPROL-XL) 25 MG 24 hr tablet Take 1 tablet (25 mg total) by mouth daily. 90 tablet 3  . Multiple Vitamin (MULTIVITAMIN WITH MINERALS) TABS tablet Take 1 tablet by mouth daily.    . pravastatin (PRAVACHOL) 40 MG tablet TAKE 1/2 TABLET FOR FIRST WEEK THEN TAKE 1 TABLET BY MOUTH . 30 tablet 5  . telmisartan (MICARDIS) 80 MG tablet Take 1 tablet (80 mg total) by mouth daily. 90 tablet 1  . acetaminophen (TYLENOL) 500 MG tablet Take 500 mg by mouth daily as needed (for pain).     . fluticasone (  FLONASE) 50 MCG/ACT nasal spray Place 1-2 sprays into both nostrils daily as needed for allergies. (Patient not taking: Reported on 03/19/2015) 16 g 5  . NITROSTAT 0.4 MG SL tablet Place 0.4 mg under the tongue every 5 (five) minutes as needed for chest pain.     [name] Kitchen zolpidem (AMBIEN) 5 MG tablet Take 1 tablet (5 mg total) by mouth at bedtime as needed for sleep. (Patient not taking: Reported on 03/19/2015) 30 tablet 5   No current facility-administered medications for this visit.    Objective: BP 148/82 mmHg  Pulse 62  Temp(Src) 98 F (36.7 C)  Wt 198 lb (89.812 kg) Gen: NAD, resting comfortably in chair CV: RRR no  murmurs rubs or gallops Lungs: CTAB no crackles, wheeze, rhonchi Abdomen: soft/mild tenderness throughout R side and in epigastric area (unchanged) also with pain over right lower ribs (new)/nondistended/normal bowel sounds. No rebound or guarding.  Ext: no edema Skin: warm, dry, no rash over abdomen    Assessment/Plan:  Right sided abdominal pain Given persistence, I suggested we complete a CT scan to further evaluate abdomen and potential fatty liver vs. Hepatocellular disease portion. Patient declined and continues to think this is muscle strain as it seems to get worse when he overdoes it then seem to ease off with time. I asked him to follow up with me within a month and if it persists, allow me to pursue CT scan abdomen/pelvis. Would need updated cr. Hopeful BP improved at follow up as well.   Return precautions advised.   Meds ordered this encounter  Medications  . Melatonin 10 MG TABS    Sig: Take 10 mg by mouth.  . cyanocobalamin ((VITAMIN B-12)) injection 1,000 mcg    Sig:

## 2015-03-19 NOTE — Patient Instructions (Signed)
You wanted to give things a try with truly taking it easier for a month without heavy lifting, mowing neighbor's grass as it feels like you keep re injuring area. I discussed with you considering CT scan of your abdomen but you declined for now  Let's see each other back in 1 month (or sooner if symptoms worsen) to recheck your blood pressure as slightly high as well as check on abdominal pain.

## 2015-04-06 ENCOUNTER — Telehealth: Payer: Self-pay | Admitting: Family Medicine

## 2015-04-06 ENCOUNTER — Other Ambulatory Visit (INDEPENDENT_AMBULATORY_CARE_PROVIDER_SITE_OTHER): Payer: Medicare Other

## 2015-04-06 DIAGNOSIS — R109 Unspecified abdominal pain: Secondary | ICD-10-CM | POA: Diagnosis not present

## 2015-04-06 DIAGNOSIS — I10 Essential (primary) hypertension: Secondary | ICD-10-CM

## 2015-04-06 DIAGNOSIS — R1011 Right upper quadrant pain: Secondary | ICD-10-CM

## 2015-04-06 LAB — BASIC METABOLIC PANEL
BUN: 23 mg/dL (ref 6–23)
CO2: 27 mEq/L (ref 19–32)
Calcium: 8.8 mg/dL (ref 8.4–10.5)
Chloride: 109 mEq/L (ref 96–112)
Creatinine, Ser: 0.86 mg/dL (ref 0.40–1.50)
GFR: 91.71 mL/min (ref >60.00)
Glucose, Bld: 114 mg/dL — ABNORMAL HIGH (ref 70–99)
Potassium: 4.3 mEq/L (ref 3.5–5.1)
Sodium: 140 mEq/L (ref 135–145)

## 2015-04-06 NOTE — Addendum Note (Signed)
Addended by: Joyce Gross R on: 04/06/2015 01:37 PM   Modules accepted: Orders

## 2015-04-06 NOTE — Telephone Encounter (Signed)
Pt scheduled  

## 2015-04-06 NOTE — Telephone Encounter (Signed)
CT abd/pelvis with contrast Creatinine under hypertension  Thanks

## 2015-04-06 NOTE — Telephone Encounter (Signed)
Pt said he is still having right side pain. Was told if he is still having the pain Dr Yong Channel would schedule a CT scan.

## 2015-04-06 NOTE — Telephone Encounter (Signed)
[name] should the CT be with or without contrast and what would i enter the Cr under since your last note says he would need an updated one?

## 2015-04-06 NOTE — Telephone Encounter (Signed)
CT and lab entered, can you please schedule pt to come by and get lab updated please.

## 2015-04-06 NOTE — Telephone Encounter (Signed)
Pt scheduled for his Ct on  Tue may 24,2016 @11 :30 am he need a BMET  BUN AND CREATINE before his appt . Pt will come by to pick up the contrast today , he would like an appt for his labs also while he is here .

## 2015-04-06 NOTE — Telephone Encounter (Signed)
Labs entered, I asked Mrs. Cheryl to contact pt to get him on the lab schedule.

## 2015-04-10 ENCOUNTER — Ambulatory Visit (INDEPENDENT_AMBULATORY_CARE_PROVIDER_SITE_OTHER)
Admission: RE | Admit: 2015-04-10 | Discharge: 2015-04-10 | Disposition: A | Discharge status: Home / Self Care | Payer: Medicare Other | Source: Ambulatory Visit | Attending: Family Medicine | Admitting: Family Medicine

## 2015-04-10 DIAGNOSIS — R1011 Right upper quadrant pain: Secondary | ICD-10-CM

## 2015-04-10 MED ORDER — IOHEXOL 300 MG/ML  SOLN
100.0000 mL | Freq: Once | INTRAMUSCULAR | Status: AC | PRN
  Administered 2015-04-10: 100 mL via INTRAVENOUS

## 2015-04-20 ENCOUNTER — Ambulatory Visit (INDEPENDENT_AMBULATORY_CARE_PROVIDER_SITE_OTHER): Payer: Medicare Other | Admitting: Family Medicine

## 2015-04-20 ENCOUNTER — Encounter: Payer: Self-pay | Admitting: Family Medicine

## 2015-04-20 VITALS — BP 124/78 | HR 64 | Temp 98.1°F | Wt 200.0 lb

## 2015-04-20 DIAGNOSIS — R109 Unspecified abdominal pain: Secondary | ICD-10-CM | POA: Diagnosis not present

## 2015-04-20 DIAGNOSIS — E538 Deficiency of other specified B group vitamins: Secondary | ICD-10-CM | POA: Diagnosis not present

## 2015-04-20 MED ORDER — CYANOCOBALAMIN 1000 MCG/ML IJ SOLN
1000.0000 ug | Freq: Once | INTRAMUSCULAR | Status: AC
  Administered 2015-04-20: 1000 ug via INTRAMUSCULAR

## 2015-04-20 NOTE — Progress Notes (Signed)
[name], MD  Subjective:  Timothy Gallagher is a 79 y.o. year old very pleasant male patient who presents with:  Right sided abdominal pain -Pain is rated as mild now. Not having to wake up with the pain now. Seems to be better with activity now. Worse when he goes to lay down. [place] and CT were largely normal. He did come off fish oil which could be related as does have IBS.   ROS- no fever/chills/unintentional weight loss/chest pain  Past Medical History- CAD s/p CABG, HTN, BPH  Medications- reviewed and updated Current Outpatient Prescriptions  Medication Sig Dispense Refill  . acetaminophen (TYLENOL) 500 MG tablet Take 500 mg by mouth daily as needed (for pain).     [name] Kitchen aspirin EC 325 MG EC tablet Take 1 tablet (325 mg total) by mouth daily.    . cetirizine (ZYRTEC) 10 MG tablet Take 10 mg by mouth daily as needed for allergies.     . cyanocobalamin (,VITAMIN B-12,) 1000 MCG/ML injection Inject 1.48ml every 3 weeks 10 mL 3  . fluticasone (FLONASE) 50 MCG/ACT nasal spray Place 1-2 sprays into both nostrils daily as needed for allergies. 16 g 5  . metoprolol succinate (TOPROL-XL) 25 MG 24 hr tablet Take 1 tablet (25 mg total) by mouth daily. 90 tablet 3  . Multiple Vitamin (MULTIVITAMIN WITH MINERALS) TABS tablet Take 1 tablet by mouth daily.    [name] Kitchen NITROSTAT 0.4 MG SL tablet Place 0.4 mg under the tongue every 5 (five) minutes as needed for chest pain.     . pravastatin (PRAVACHOL) 40 MG tablet TAKE 1/2 TABLET FOR FIRST WEEK THEN TAKE 1 TABLET BY MOUTH . 30 tablet 5  . telmisartan (MICARDIS) 80 MG tablet Take 1 tablet (80 mg total) by mouth daily. 90 tablet 1   No current facility-administered medications for this visit.    Objective: BP 124/78 mmHg  Pulse 64  Temp(Src) 98.1 F (36.7 C) (Oral)  Wt 200 lb (90.719 kg) Gen: NAD, resting comfortably in chair CV: RRR no murmurs rubs or gallops Lungs: CTAB no crackles, wheeze, rhonchi Abdomen: soft/very mild tenderness throughout R  side and in epigastric area (improved) also with pain over right lower ribs (same as last visit)/nondistended/normal bowel sounds. No rebound or guarding.  Ext: no edema Skin: warm, dry, no rash over abdomen    Assessment/Plan:  Right sided abdominal pain Improved from last visit. [place] and CT obviously very reassuring. Doubt malignancy. Could have some IBS element as the waking up at night has now resolved off fish oil. We will plan on returning to regular follow up unless new or worsening symptoms  Meds ordered this encounter  Medications  . cyanocobalamin ((VITAMIN B-12)) injection 1,000 mcg    Sig:    >50% of 15 minute office visit was spent on counseling (warning signs to look out for, reviewing CT san results, discussing no red flags so far and reassuring workup) and coordination of care

## 2015-04-20 NOTE — Patient Instructions (Addendum)
Glad you are doing better with activity now  Your Ct scan was pretty much normal and no major concerns found.   This could be muscle related  See me in 4 months  Change injections to once a month, we will check b12 next visit

## 2015-04-23 ENCOUNTER — Ambulatory Visit: Payer: Medicare Other | Admitting: Family Medicine

## 2015-05-01 ENCOUNTER — Encounter: Payer: Self-pay | Admitting: Family Medicine

## 2015-05-01 ENCOUNTER — Ambulatory Visit (INDEPENDENT_AMBULATORY_CARE_PROVIDER_SITE_OTHER): Payer: Medicare Other | Admitting: Family Medicine

## 2015-05-01 VITALS — BP 118/68 | HR 74 | Temp 98.1°F | Wt 203.0 lb

## 2015-05-01 DIAGNOSIS — I10 Essential (primary) hypertension: Secondary | ICD-10-CM | POA: Diagnosis not present

## 2015-05-01 DIAGNOSIS — R5383 Other fatigue: Secondary | ICD-10-CM

## 2015-05-01 NOTE — Progress Notes (Signed)
Timothy Reddish, MD  Subjective:  Timothy Gallagher is a 79 y.o. year old very pleasant male patient who presents with:  Hypertension-reports BP has been low  Fatigue, lightheadedness, low energy/fatigue very mild nausea. No muscle aches except some in the back Sinus drainage, sneezing- taking zyrtec. No sinus pressure. Mainly clear- comes and goes- not new with this. Going on about a week. Took metoprolol this morning. Did not take telmisartan today. Feeling slightly better over last 1-2 days.  Home BP 95/65 at home earlier today. Feels like he cannot do any sustained activity due to fatigue.  BP Readings from Last 3 Encounters:  05/01/15 118/68  04/20/15 124/78  03/19/15 148/82  Compliant with medications-yes without side effects except telmisartan today ROS-Denies any CP, HA, blurry vision, LE edema,orthopnea, PND. No SOB, just states gets exhausted. No syncope. No reported brbpr, hematemesis, hematuria, hemoptysis  Past Medical History- CAD s/p CABG, HLD, HTN, BPH, anemia- slowly improving postoperatively  Medications- reviewed and updated Current Outpatient Prescriptions  Medication Sig Dispense Refill  . aspirin EC 325 MG EC tablet Take 1 tablet (325 mg total) by mouth daily.    . cetirizine (ZYRTEC) 10 MG tablet Take 10 mg by mouth daily as needed for allergies.     . cyanocobalamin (,VITAMIN B-12,) 1000 MCG/ML injection Inject 1.41ml every 3 weeks 10 mL 3  . fluticasone (FLONASE) 50 MCG/ACT nasal spray Place 1-2 sprays into both nostrils daily as needed for allergies. 16 g 5  . metoprolol succinate (TOPROL-XL) 25 MG 24 hr tablet Take 1 tablet (25 mg total) by mouth daily. 90 tablet 3  . Multiple Vitamin (MULTIVITAMIN WITH MINERALS) TABS tablet Take 1 tablet by mouth daily.    . pravastatin (PRAVACHOL) 40 MG tablet TAKE 1/2 TABLET FOR FIRST WEEK THEN TAKE 1 TABLET BY MOUTH . 30 tablet 5  . telmisartan (MICARDIS) 80 MG tablet Take 1 tablet (80 mg total) by mouth daily. 90 tablet 1  .  acetaminophen (TYLENOL) 500 MG tablet Take 500 mg by mouth daily as needed (for pain).     [name] Kitchen NITROSTAT 0.4 MG SL tablet Place 0.4 mg under the tongue every 5 (five) minutes as needed for chest pain.      Objective: BP 118/68 mmHg  Pulse 74  Temp(Src) 98.1 F (36.7 C)  Wt 203 lb (92.08 kg) Gen: NAD, resting comfortably Mild turbinate erythema with some clear and some yellow drainage, oropharynx largely normal- mild pharyngeal erythema, TM normal, no sinus tenderness CV: RRR no murmurs rubs or gallops Lungs: CTAB no crackles, wheeze, rhonchi, no labored breathing Abdomen: soft/nontender/nondistended/normal bowel sounds.  Ext: no edema Skin: warm, dry, no rash Neuro: grossly normal, moves all extremities, normal gait  Assessment/Plan:  Hypertension-reports BP has been low at home Controlled in office just on metoprolol, no telmisartan.  lightheadedness, low energy/fatigue May have had contact with a viral illness. Slowly improving. Low BP contributing -possible decreased PO as cause. Discussed hydration and reasons for follow up before regularly scheduled 05/18/15 follow up. Obviously patient at risk for cardiac disease but no chest pain, shortness of breath and I have a low suspicion. We will follow up closely and get plugged into cards if need ariess. Does have history of mild anemia and could consider rechecking if fatigue persists. No source of bleeding reported though.

## 2015-05-01 NOTE — Patient Instructions (Signed)
Sounds like you had a virus which has knocked you back a bit  Try to stay hydrated  Your blood pressure today is lower at 118/68 without telmisartan so let's hold off on this for now. When your blood pressure gets back to >145/90 at 3 o clock- start your telmisartan back  We have a visit 7/1 so we can check on this  If you do not continue to slowly improve as far as your fatigue, i am happy to see you back before appointment already scheduled  Don't take the tamsulosin as it can make the blood pressure issues worse

## 2015-05-05 ENCOUNTER — Other Ambulatory Visit: Payer: Self-pay | Admitting: Family Medicine

## 2015-05-18 ENCOUNTER — Ambulatory Visit (INDEPENDENT_AMBULATORY_CARE_PROVIDER_SITE_OTHER): Payer: Medicare Other | Admitting: Family Medicine

## 2015-05-18 DIAGNOSIS — E538 Deficiency of other specified B group vitamins: Secondary | ICD-10-CM

## 2015-05-18 MED ORDER — CYANOCOBALAMIN 1000 MCG/ML IJ SOLN
1000.0000 ug | Freq: Once | INTRAMUSCULAR | Status: AC
  Administered 2015-05-18: 1000 ug via INTRAMUSCULAR

## 2015-05-22 ENCOUNTER — Ambulatory Visit (INDEPENDENT_AMBULATORY_CARE_PROVIDER_SITE_OTHER): Payer: Medicare Other | Admitting: Family Medicine

## 2015-05-22 ENCOUNTER — Encounter: Payer: Self-pay | Admitting: Family Medicine

## 2015-05-22 VITALS — BP 136/80 | HR 75 | Temp 98.8°F | Wt 203.0 lb

## 2015-05-22 DIAGNOSIS — G4700 Insomnia, unspecified: Secondary | ICD-10-CM | POA: Diagnosis not present

## 2015-05-22 DIAGNOSIS — R351 Nocturia: Secondary | ICD-10-CM

## 2015-05-22 DIAGNOSIS — I10 Essential (primary) hypertension: Secondary | ICD-10-CM

## 2015-05-22 DIAGNOSIS — N400 Enlarged prostate without lower urinary tract symptoms: Secondary | ICD-10-CM | POA: Diagnosis not present

## 2015-05-22 LAB — POCT URINALYSIS DIPSTICK
Bilirubin, UA: NEGATIVE
Blood, UA: NEGATIVE
Glucose, UA: NEGATIVE
Leukocytes, UA: NEGATIVE
Nitrite, UA: NEGATIVE
Protein, UA: NEGATIVE
Spec Grav, UA: 1.025
Urobilinogen, UA: NEGATIVE
pH, UA: 5

## 2015-05-22 MED ORDER — TRAZODONE HCL 50 MG PO TABS: 25.0000 mg | ORAL_TABLET | Freq: Every evening | ORAL | Status: DC | PRN

## 2015-05-22 MED ORDER — TAMSULOSIN HCL 0.4 MG PO CAPS: 0.4000 mg | ORAL_CAPSULE | Freq: Every day | ORAL | Status: DC

## 2015-05-22 NOTE — Assessment & Plan Note (Signed)
telmisartan 80mg = fatigued at times with BP into 90s or 100s and this was stopped. BP still controlled on Metoprolol 25 mg XL. Suspect may be able to better tolerate flomax with higher BPs at present and still controlled overall.

## 2015-05-22 NOTE — Assessment & Plan Note (Signed)
Worsening symptoms nocturia. UA largely reassuring, also check culture. Prostate enlarged on exam but not tender. Restart flomax.

## 2015-05-22 NOTE — Progress Notes (Signed)
[name], MD  Subjective:  Timothy Gallagher is a 79 y.o. year old very pleasant male patient who presents with:  BPH with Nocturia  -Peeing frequently, mainly at nighttime. Symptoms at least a month. Had one night went 12x, usually goes 3-4x a night when previously was going 1-2x a night. Appears patient had run out back sometime last year and when I saw him he only had once a night nocturia and wanted to remain off flomax. May have had some orthostatic issues as well. We continued him off of this and had done well until last 4-6 weeks.  ROS- no dysuria, polydipsia, penile discharge  Hypertension-controlled on metoprolol alone- now off telmisartan  BP Readings from Last 3 Encounters:  05/22/15 136/80  05/01/15 118/68  04/20/15 124/78  Compliant with medications-yes without side effects ROS-Denies any CP, HA, SOB, blurry vision, LE edema. NO SI/HI  Past Medical History- CAD, anemia, BPH, HTN, HLD  Medications- reviewed and updated Current Outpatient Prescriptions  Medication Sig Dispense Refill  . aspirin EC 325 MG EC tablet Take 1 tablet (325 mg total) by mouth daily.    . cetirizine (ZYRTEC) 10 MG tablet Take 10 mg by mouth daily as needed for allergies.     . cyanocobalamin (,VITAMIN B-12,) 1000 MCG/ML injection Inject 1.58ml every 3 weeks 10 mL 3  . fluticasone (FLONASE) 50 MCG/ACT nasal spray Place 1-2 sprays into both nostrils daily as needed for allergies. 16 g 5  . metoprolol succinate (TOPROL-XL) 25 MG 24 hr tablet Take 1 tablet (25 mg total) by mouth daily. 90 tablet 3  . Multiple Vitamin (MULTIVITAMIN WITH MINERALS) TABS tablet Take 1 tablet by mouth daily.    . pravastatin (PRAVACHOL) 40 MG tablet TAKE 1/2 TABLET FOR FIRST WEEK THEN TAKE 1 TABLET BY MOUTH . 30 tablet 5  . acetaminophen (TYLENOL) 500 MG tablet Take 500 mg by mouth daily as needed (for pain).     [name] Kitchen NITROSTAT 0.4 MG SL tablet Place 0.4 mg under the tongue every 5 (five) minutes as needed for chest pain.       Objective: BP 136/80 mmHg  Pulse 75  Temp(Src) 98.8 F (37.1 C)  Wt 203 lb (92.08 kg) Gen: NAD, resting comfortably CV: RRR no murmurs rubs or gallops Lungs: CTAB no crackles, wheeze, rhonchi Abdomen: soft/nontender/nondistended/normal bowel sounds. No rebound or guarding.  Ext: no edema Rectal: normal tone, diffusely enlarged prostate, no masses or tenderness Skin: warm, dry, no rash Neuro: grossly normal, moves all extremities   Assessment/Plan:  Insomnia S:Temazepam-insurance no longer covering. Poor control off this medicine A/P: we will trial trazodone 25-50mg    BPH (benign prostatic hyperplasia) Worsening symptoms nocturia. UA largely reassuring, also check culture. Prostate enlarged on exam but not tender. Restart flomax.   Essential hypertension telmisartan 80mg = fatigued at times with BP into 90s or 100s and this was stopped. BP still controlled on Metoprolol 25 mg XL. Suspect may be able to better tolerate flomax with higher BPs at present and still controlled overall.   Oct f/u planned .  Orders Placed This Encounter  Procedures  . Culture, Urine  . POC Urinalysis Dipstick   Meds ordered this encounter  Medications  . tamsulosin (FLOMAX) 0.4 MG CAPS capsule    Sig: Take 1 capsule (0.4 mg total) by mouth daily.    Dispense:  30 capsule    Refill:  5  . traZODone (DESYREL) 50 MG tablet    Sig: Take 0.5-1 tablets (25-50 mg total) by  mouth at bedtime as needed for sleep.    Dispense:  30 tablet    Refill:  3

## 2015-05-22 NOTE — Patient Instructions (Addendum)
Restart flomax. You have a large prostate as reason for your frequent nighttime urination. Just to be sure we are sending it for culture  Try trazodone for sleep. Take 1/2 tab to start before sleep, take full tab if it doesn't work after a week.

## 2015-05-22 NOTE — Assessment & Plan Note (Signed)
S:Temazepam-insurance no longer covering. Poor control off this medicine A/P: we will trial trazodone 25-50mg 

## 2015-05-24 ENCOUNTER — Telehealth: Payer: Self-pay | Admitting: Family Medicine

## 2015-05-24 LAB — URINE CULTURE
Colony Count: NO GROWTH
Organism ID, Bacteria: NO GROWTH

## 2015-05-24 NOTE — Telephone Encounter (Signed)
Called and spoke with pt and he states he wants to stay off of the Trazodone all together. He then asked if the flomax would cause him to be lightheaded and feel this way, I explained to pt again that it could have been from starting both medications at the same time and if he continues to feel this way after stopping the trazadone to please follow up with [place].

## 2015-05-24 NOTE — Telephone Encounter (Signed)
See below

## 2015-05-24 NOTE — Telephone Encounter (Signed)
Have him hold off on the trazodone for now. Wait at least 2 weeks while remaining on flomax. May trial another 1/2 tab of the trazodone 2 weeks later but make sure wife is around for stability. Suspect it may be from starting both at same time.

## 2015-05-24 NOTE — Telephone Encounter (Signed)
Pt would like you to know the am after taking the traZODone (DESYREL) 50 MG tablet he got up very light headed and "woosy". Pt was stumbling and could not walk across the room. Pt only took 1/2 pill.  Pt also took the tamsulosin (FLOMAX) 0.4 MG CAPS capsule which seem to be OK. Pt not sure if maybe something else is going on. pls advise

## 2015-05-25 ENCOUNTER — Telehealth: Payer: Self-pay | Admitting: Family Medicine

## 2015-05-25 NOTE — Telephone Encounter (Signed)
Appointment scheduled for Monday at 2:15 with MD Yong Channel

## 2015-05-25 NOTE — Telephone Encounter (Signed)
[facility] - Client [facility]  Patient Name: Timothy Gallagher  DOB: [birth year]    Initial Comment Caller states he has been dizzy all day   Nurse Assessment  Nurse: Justine Null, RN, Rodena Piety Date/Time (Eastern Time): 05/25/2015 1:56:16 PM  Confirm and document reason for call. If symptomatic, describe symptoms. ---Caller states he has been dizzy all day caller stated that he has had this before and he was unable to stand up at the lunch today and has been having the today and has been having today  Has the patient traveled out of the country within the last 30 days? ---No  Does the patient require triage? ---Yes  Related visit to physician within the last 2 weeks? ---Yes  Does the PT have any chronic conditions? (i.e. diabetes, asthma, etc.) ---Yes  List chronic conditions. ---heart disease     Guidelines    Guideline Title Affirmed Question Affirmed Notes  Dizziness - Lightheadedness [1] MODERATE dizziness (e.g., interferes with normal activities) AND [2] has NOT been evaluated by physician for this (Exception: dizziness caused by heat exposure, sudden standing, or poor fluid intake)    Final Disposition User   See Physician within [place], Therapist, sports, Rodena Piety

## 2015-05-28 ENCOUNTER — Encounter: Payer: Self-pay | Admitting: Family Medicine

## 2015-05-28 ENCOUNTER — Ambulatory Visit (INDEPENDENT_AMBULATORY_CARE_PROVIDER_SITE_OTHER): Payer: Medicare Other | Admitting: Family Medicine

## 2015-05-28 VITALS — BP 130/66 | HR 72 | Temp 98.3°F | Wt 203.0 lb

## 2015-05-28 DIAGNOSIS — N400 Enlarged prostate without lower urinary tract symptoms: Secondary | ICD-10-CM | POA: Diagnosis not present

## 2015-05-28 DIAGNOSIS — R42 Dizziness and giddiness: Secondary | ICD-10-CM

## 2015-05-28 NOTE — Assessment & Plan Note (Addendum)
Dizziness. BPH Improved with flomax but may be causing dizziness/orthostasis. We will trial off 2 weeks and then reattempt if symptoms clear. If recur, may need to use finasteride in long run. I doubt dizziness was caused by trazodone but we will d/c that medication and list as allergy.

## 2015-05-28 NOTE — Progress Notes (Signed)
[name], MD  Subjective:  Timothy Gallagher is a 78 y.o. year old very pleasant male patient who presents with:  Dizziness-new BPH- improved -Feels lightheaded, no room spinning. Worse with changing positions from sitting to standing. Some dizziness in AM wears off. 5-6 x a day. Lasts about a minute. First episode was night after took trazodone 25mg  about 6 days ago. Was taking flomax as well. Stopped trazodone, started flomax every other day but on days he doesn't take it he doesn't notice a difference- remains dizzy. Notices nocturia once a night on flomax.   Sees cardiothoracic surgery week from next Monday and cardiology within a few weeks.   Does admit had some dizziness prior to 6 days ago but much less frequent.   ROS- no chest pain, shortness of breath (other than with stairs which is his baseline), nausea, vomiting, trouble speaking or swallowing, extremity weakness, no hearing loss, has had tinnitus R ear 7 years with no recent worsening  Past Medical History- CAD with CABG, BPH, HTN, HLD, insomnia  Medications- reviewed and updated Current Outpatient Prescriptions  Medication Sig Dispense Refill  . aspirin EC 325 MG EC tablet Take 1 tablet (325 mg total) by mouth daily.    . cetirizine (ZYRTEC) 10 MG tablet Take 10 mg by mouth daily as needed for allergies.     . cyanocobalamin (,VITAMIN B-12,) 1000 MCG/ML injection Inject 1.36ml every 3 weeks 10 mL 3  . fluticasone (FLONASE) 50 MCG/ACT nasal spray Place 1-2 sprays into both nostrils daily as needed for allergies. 16 g 5  . metoprolol succinate (TOPROL-XL) 25 MG 24 hr tablet Take 1 tablet (25 mg total) by mouth daily. 90 tablet 3  . Multiple Vitamin (MULTIVITAMIN WITH MINERALS) TABS tablet Take 1 tablet by mouth daily.    . pravastatin (PRAVACHOL) 40 MG tablet TAKE 1/2 TABLET FOR FIRST WEEK THEN TAKE 1 TABLET BY MOUTH . 30 tablet 5  . tamsulosin (FLOMAX) 0.4 MG CAPS capsule Take 1 capsule (0.4 mg total) by mouth daily. 30  capsule 5  . acetaminophen (TYLENOL) 500 MG tablet Take 500 mg by mouth daily as needed (for pain).     [name] Kitchen NITROSTAT 0.4 MG SL tablet Place 0.4 mg under the tongue every 5 (five) minutes as needed for chest pain.     . traZODone (DESYREL) 50 MG tablet Take 0.5-1 tablets (25-50 mg total) by mouth at bedtime as needed for sleep. (Patient not taking: Reported on 05/28/2015) 30 tablet 3   Objective: BP 130/66 mmHg  Pulse 72  Temp(Src) 98.3 F (36.8 C)  Wt 203 lb (92.08 kg) Gen: NAD, resting comfortably in chair CV: RRR no murmurs rubs or gallops Lungs: CTAB no crackles, wheeze, rhonchi Abdomen: soft/nontender/nondistended/normal bowel sounds. No rebound or guarding.  Ext: no edema Skin: warm, dry, no rash Neuro: CN II-XII intact, sensation and reflexes normal throughout, 5/5 muscle strength in bilateral upper and lower extremities. Normal finger to nose. Normal rapid alternating movements. Normal romberg, no pronator drift.    Assessment/Plan:  BPH (benign prostatic hyperplasia) Dizziness. BPH Improved with flomax but may be causing dizziness/orthostasis. We will trial off 2 weeks and then reattempt if symptoms clear. If recur, may need to use finasteride in long run. I doubt dizziness was caused by trazodone but we will d/c that medication and list as allergy.    Return precautions advised. Will be seeing cardiothoracic surgery and cardiology soon who will assess from cardiac perspective. Had reassuring neurological exam. I also doubt  arhythmia. Asked patient to follow up with me after he sees 2 specialists.

## 2015-05-28 NOTE — Patient Instructions (Signed)
I want you to stop the trazodone complete.  Also stop the flomax completely for 2 weeks.   Monitor your symptoms.   If symptoms completely resolve, may trial the flomax back 1 night only and monitor symptoms next day. If symptoms recur, stop flomax permanently  Check in with me a few weeks after seeing cardiology for a reassessment

## 2015-06-09 ENCOUNTER — Other Ambulatory Visit: Payer: Self-pay | Admitting: Family Medicine

## 2015-06-11 ENCOUNTER — Ambulatory Visit: Payer: Medicare Other | Admitting: Thoracic Surgery (Cardiothoracic Vascular Surgery)

## 2015-06-15 ENCOUNTER — Ambulatory Visit (INDEPENDENT_AMBULATORY_CARE_PROVIDER_SITE_OTHER): Payer: Medicare Other | Admitting: Family Medicine

## 2015-06-15 DIAGNOSIS — E538 Deficiency of other specified B group vitamins: Secondary | ICD-10-CM

## 2015-06-15 MED ORDER — CYANOCOBALAMIN 1000 MCG/ML IJ SOLN
1000.0000 ug | Freq: Once | INTRAMUSCULAR | Status: AC
  Administered 2015-06-15: 1000 ug via INTRAMUSCULAR

## 2015-06-19 ENCOUNTER — Encounter: Payer: Self-pay | Admitting: Interventional Cardiology

## 2015-06-19 ENCOUNTER — Ambulatory Visit (INDEPENDENT_AMBULATORY_CARE_PROVIDER_SITE_OTHER): Payer: Medicare Other | Admitting: Interventional Cardiology

## 2015-06-19 VITALS — BP 132/84 | HR 61 | Ht 73.0 in | Wt 199.4 lb

## 2015-06-19 DIAGNOSIS — I2510 Atherosclerotic heart disease of native coronary artery without angina pectoris: Secondary | ICD-10-CM | POA: Diagnosis not present

## 2015-06-19 DIAGNOSIS — I10 Essential (primary) hypertension: Secondary | ICD-10-CM | POA: Diagnosis not present

## 2015-06-19 DIAGNOSIS — E785 Hyperlipidemia, unspecified: Secondary | ICD-10-CM | POA: Diagnosis not present

## 2015-06-19 MED ORDER — ASPIRIN 81 MG PO TBEC: 81.0000 mg | DELAYED_RELEASE_TABLET | Freq: Every day | ORAL | Status: AC

## 2015-06-19 NOTE — Progress Notes (Signed)
Cardiology Office Note   Date:  06/19/2015   ID:  Timothy Gallagher, DOB September 13, 1936, MRN [id number]  PCP:  [name], MD  Cardiologist:  Sinclair Grooms, MD   Chief Complaint  Patient presents with  . Coronary Artery Disease      History of Present Illness: Timothy Gallagher is a 79 y.o. male who presents for CAD with prior CABG, hypertension, hyperlipidemia, and obesity.  Stop taking the statin therapy about 2 weeks ago for uncertain reasons. He states he was having dizziness and congestion and thought the medication was doing this.  Having orthostatic dizziness from time to time.  He denies angina.  Past Medical History  Diagnosis Date  . ACNE ROSACEA 06/27/2009  . ACTINIC KERATOSIS, HEAD 04/18/2009  . Acute maxillary sinusitis 05/14/2010  . ALLERGIC RHINITIS 04/09/2007  . B12 DEFICIENCY 06/07/2007  . BACK PAIN WITH RADICULOPATHY 04/24/2008  . CHEST WALL PAIN, ACUTE 06/15/2009  . Chronic maxillary sinusitis 05/29/2008  . COLITIS 04/27/2009  . COLONIC POLYPS, HX OF 04/27/2009    tubular adenomas  . DERMATITIS, ATOPIC 10/12/2007  . DIVERTICULOSIS, COLON 04/27/2009  . ECCHYMOSES, SPONTANEOUS 06/27/2008  . Elevated sedimentation rate 05/02/2009  . ESOPHAGEAL STRICTURE 04/27/2009  . GASTRITIS, CHRONIC 04/27/2009  . HYPERLIPIDEMIA 03/13/2008  . HYPERTENSION 04/09/2007  . Irritable bowel syndrome 08/11/2007  . KIDNEY DISEASE 04/09/2007  . NECK PAIN 09/14/2008  . NEUROPATHY, IDIOPATHIC PERIPHERAL NEC 08/11/2007  . OSTEOARTHRITIS, WRIST, RIGHT 08/05/2010  . Cancer     skin  . Coronary artery disease 05/12/2014    Cath 05/12/2014 w/ severe 3-vessel CAD and preserved LV function, EF 55%  . S/P CABG x 4 05/19/2014    LIMA to LAD, SVG to diag, SVG to OM, SVG to PDA, EVH via right thigh and leg  . Postoperative delirium 05/20/2014  . Internal bleeding hemorrhoids 01/22/2015    01/22/2015 Seen at anoscopy, grade 1 all 3 positions     Past Surgical History  Procedure Laterality Date  . Hernia  repair    . Lamenectomy    . Lumbar fusion    . Tonsillectomy    . Incision and drainage wound with foreign body removal Left 12/20/2013    Procedure: INCISION AND DRAINAGE LEFT INDEX FINGER;  Surgeon: Tennis Must, MD;  Location: [facility];  Service: Orthopedics;  Laterality: Left;  . Esophagogastroduodenoscopy    . Colonoscopy    . Flexible sigmoidoscopy    . Varicose vein surgery Left   . Cardiac catheterization    . Finger surgery      cut off end of finger  . Coronary artery bypass graft N/A 05/19/2014    Procedure: CORONARY ARTERY BYPASS GRAFTING (CABG);  Surgeon: Rexene Alberts, MD;  Location: [facility];  Service: Open Heart Surgery;  Laterality: N/A;  Times 4 using left internal mammary artery and endoscopically harvested right saphenous vein  . Intraoperative transesophageal echocardiogram N/A 05/19/2014    Procedure: INTRAOPERATIVE TRANSESOPHAGEAL ECHOCARDIOGRAM;  Surgeon: Rexene Alberts, MD;  Location: [facility];  Service: Open Heart Surgery;  Laterality: N/A;  . Left heart catheterization with coronary angiogram N/A 05/12/2014    Procedure: LEFT HEART CATHETERIZATION WITH CORONARY ANGIOGRAM;  Surgeon: Sinclair Grooms, MD;  Location: [facility] CATH LAB;  Service: Cardiovascular;  Laterality: N/A;     Current Outpatient Prescriptions  Medication Sig Dispense Refill  . acetaminophen (TYLENOL) 500 MG tablet Take 500 mg by mouth daily as needed (for pain).     [name] Kitchen  aspirin EC 325 MG EC tablet Take 1 tablet (325 mg total) by mouth daily.    . cetirizine (ZYRTEC) 10 MG tablet Take 10 mg by mouth daily as needed for allergies.     . cyanocobalamin (,VITAMIN B-12,) 1000 MCG/ML injection Inject 1.46ml every 3 weeks 10 mL 3  . fluticasone (FLONASE) 50 MCG/ACT nasal spray Place 1-2 sprays into both nostrils daily as needed for allergies. 16 g 5  . metoprolol succinate (TOPROL-XL) 25 MG 24 hr tablet Take 1 tablet (25 mg total) by mouth daily. 90 tablet 3  . Multiple Vitamin (MULTIVITAMIN WITH MINERALS) TABS  tablet Take 1 tablet by mouth daily.    [name] Kitchen NITROSTAT 0.4 MG SL tablet Place 0.4 mg under the tongue every 5 (five) minutes as needed for chest pain.     . pravastatin (PRAVACHOL) 40 MG tablet TAKE 1/2 TABLET FOR FIRST WEEK THEN TAKE 1 TABLET BY MOUTH . 30 tablet 5  . tamsulosin (FLOMAX) 0.4 MG CAPS capsule Take 1 capsule (0.4 mg total) by mouth daily. 30 capsule 5  . telmisartan (MICARDIS) 80 MG tablet Take 80 mg by mouth daily as needed. (high blood pressure readings)  2   No current facility-administered medications for this visit.    Allergies:   Lipitor; Trazodone and nefazodone; Ciprofloxacin; Mycophenolate mofetil; Amoxicillin; Penicillins; and Rosuvastatin    Social History:  The patient  reports that he quit smoking about 13 years ago. His smoking use included Cigarettes. He has a 21 pack-year smoking history. He has never used smokeless tobacco. He reports that he does not drink alcohol or use illicit drugs.   Family History:  The patient's family history includes COPD in his father; Heart disease in his father; Hernia in his mother. There is no history of Colon cancer.    ROS:  Please see the history of present illness.   Otherwise, review of systems are positive for decreased memory.   All other systems are reviewed and negative.    PHYSICAL EXAM: VS:  BP 132/84 mmHg  Pulse 61  Ht 6\' 1"  (1.854 m)  Wt 90.447 kg (199 lb 6.4 oz)  BMI 26.31 kg/m2 , BMI Body mass index is 26.31 kg/(m^2). BP was standing 120/78 mmHg GEN: Well nourished, well developed, in no acute distress HEENT: normal Neck: no JVD, carotid bruits, or masses Cardiac: RRR; no murmurs, rubs, or gallops,no edema  Respiratory:  clear to auscultation bilaterally, normal work of breathing GI: soft, nontender, nondistended, + BS MS: no deformity or atrophy Skin: warm and dry, no rash Neuro:  Strength and sensation are intact Psych: euthymic mood, full affect   EKG:  EKG is ordered today. The ekg ordered today  demonstrates sinus bradycardia, normal.   Recent Labs: 02/05/2015: ALT 11; Hemoglobin 12.0*; Platelets 198.0 04/06/2015: BUN 23; Creatinine, Ser 0.86; Potassium 4.3; Sodium 140    Lipid Panel    Component Value Date/Time   CHOL 221* 08/29/2013 0759   TRIG 158.0* 08/29/2013 0759   HDL 42.20 08/29/2013 0759   CHOLHDL 5 08/29/2013 0759   VLDL 31.6 08/29/2013 0759   LDLCALC 128* 09/28/2012 1032   LDLDIRECT 100.0 12/25/2014 1127      Wt Readings from Last 3 Encounters:  06/19/15 90.447 kg (199 lb 6.4 oz)  05/28/15 92.08 kg (203 lb)  05/22/15 92.08 kg (203 lb)      Other studies Reviewed: Additional studies/ records that were reviewed today include: Reviewed laboratory data from [name]. Review of the above records demonstrates:  Last LDL was 100.   ASSESSMENT AND PLAN:  1. Coronary artery disease involving native coronary artery of native heart without angina pectoris  Asymptomatic  2. Essential hypertension  Controlled but with mild orthostatics but not associated with symptoms today  3. Hyperlipidemia   Recently discontinued therapy. On pravastatin last LDL was 100. This is near target. Needs to improve diet. May need to intensify medical regimen.    Current medicines are reviewed at length with the patient today.  The patient has concerns regarding medicines.  The following changes have been made:  Decrease aspirin 81 mg per day. Resume pravastatin 40 mg per day. Our target LDL is 70.  Labs/ tests ordered today include:  No orders of the defined types were placed in this encounter.     Disposition:   FU with HS in 1 year  Signed, Sinclair Grooms, MD  06/19/2015 9:32 AM    [address], [place], [place]  [address] Phone: [phone]; Fax: [phone]

## 2015-06-19 NOTE — Patient Instructions (Signed)
Medication Instructions:  Your physician has recommended you make the following change in your medication:  1)REDUCE Aspirin to 81mg  daily 2)RESUME Pravastatin 40mg  daily. An Rx has been sent to your pharmacy  Labwork: None   Testing/Procedures: None   Follow-Up: Your physician wants you to follow-up in: 1 year with Dr.Smith You will receive a reminder letter in the mail two months in advance. If you don't receive a letter, please call our office to schedule the follow-up appointment.   Any Other Special Instructions Will Be Listed Below (If Applicable).

## 2015-06-26 ENCOUNTER — Ambulatory Visit: Payer: Medicare Other | Admitting: Interventional Cardiology

## 2015-06-26 ENCOUNTER — Telehealth: Payer: Self-pay | Admitting: Family Medicine

## 2015-06-26 DIAGNOSIS — E785 Hyperlipidemia, unspecified: Secondary | ICD-10-CM

## 2015-06-26 MED ORDER — PITAVASTATIN CALCIUM 2 MG PO TABS: 2.0000 mg | ORAL_TABLET | Freq: Every day | ORAL | Status: DC

## 2015-06-26 NOTE — Telephone Encounter (Signed)
Trial livalo 2mg  (I sent in)- low dose can sometimes avoid muscle aches of other statins. If he fails this, needs to see cardiology about potentially taking injectable medication that is not a statin

## 2015-06-26 NOTE — Telephone Encounter (Signed)
Pt states pravastatin (PRAVACHOL) 40 MG tablet is making him feel terrible. Muscle ache, back pain, leg pain , not sleeping. Pt had gone off at one time, felt better, Got back on the med, been back on 2 wks and would like to know if there is another drug he can take. Dr [name] wanted him to stay on it, but he hopes there is an alternative.  [facility] [address]  Madaline Brilliant to leave message

## 2015-06-26 NOTE — Telephone Encounter (Signed)
Called and lm for tcb.

## 2015-06-26 NOTE — Telephone Encounter (Signed)
See below

## 2015-07-02 ENCOUNTER — Ambulatory Visit (INDEPENDENT_AMBULATORY_CARE_PROVIDER_SITE_OTHER): Payer: Medicare Other | Admitting: Thoracic Surgery (Cardiothoracic Vascular Surgery)

## 2015-07-02 ENCOUNTER — Encounter: Payer: Self-pay | Admitting: Thoracic Surgery (Cardiothoracic Vascular Surgery)

## 2015-07-02 VITALS — BP 156/90 | HR 70 | Resp 20 | Ht 73.0 in | Wt 200.0 lb

## 2015-07-02 DIAGNOSIS — I25119 Atherosclerotic heart disease of native coronary artery with unspecified angina pectoris: Secondary | ICD-10-CM | POA: Diagnosis not present

## 2015-07-02 DIAGNOSIS — Z951 Presence of aortocoronary bypass graft: Secondary | ICD-10-CM

## 2015-07-02 NOTE — Progress Notes (Signed)
[address]       ,[place] [address]             [phone]     CARDIOTHORACIC SURGERY OFFICE NOTE  Referring Provider is Belva Crome, MD PCP is [name], MD   HPI:  Patient returns for routine follow-up more than one year status post coronary artery bypass grafting 3 on 05/19/2014 for severe three-vessel coronary artery disease with normal left ventricular systolic function and symptoms of exertional shortness of breath consistent with exertional angina. The patient's postoperative recovery was entirely uncomplicated. He was last seen here in our office on 09/04/2014. He has been followed carefully by Dr. [name] who saw him most recently on 06/19/2015. The patient continues to do well from a cardiac standpoint. He denies any symptoms of exertional chest pain or chest tightness. He is quite active physically and he states that he only gets short of breath with very strenuous exertion such as mowing the lawn and very hot weather. Earlier in the summer he was having some problems with orthostatic dizzy spells. His blood pressure medicines were cut back recently by Dr. [name]. He has not been having dizzy spells recently and he claims his blood pressure has been well-controlled.   Current Outpatient Prescriptions  Medication Sig Dispense Refill  . acetaminophen (TYLENOL) 500 MG tablet Take 500 mg by mouth daily as needed (for pain).     [name] Kitchen aspirin 81 MG EC tablet Take 1 tablet (81 mg total) by mouth daily.    . cetirizine (ZYRTEC) 10 MG tablet Take 10 mg by mouth daily as needed for allergies.     . cyanocobalamin (,VITAMIN B-12,) 1000 MCG/ML injection Inject 1.45ml every 3 weeks 10 mL 3  . fluticasone (FLONASE) 50 MCG/ACT nasal spray Place 1-2 sprays into both nostrils daily as needed for allergies. 16 g 5  . metoprolol succinate (TOPROL-XL) 25 MG 24 hr tablet Take 1 tablet (25 mg total) by mouth daily. 90 tablet 3  . Multiple Vitamin (MULTIVITAMIN WITH  MINERALS) TABS tablet Take 1 tablet by mouth daily.    [name] Kitchen NITROSTAT 0.4 MG SL tablet Place 0.4 mg under the tongue every 5 (five) minutes as needed for chest pain.     . pravastatin (PRAVACHOL) 40 MG tablet Take 40 mg by mouth daily.    . tamsulosin (FLOMAX) 0.4 MG CAPS capsule Take 1 capsule (0.4 mg total) by mouth daily. (Patient not taking: Reported on 07/02/2015) 30 capsule 5  . telmisartan (MICARDIS) 80 MG tablet Take 80 mg by mouth daily as needed. (high blood pressure readings)  2   No current facility-administered medications for this visit.      Physical Exam:   BP 156/90 mmHg  Pulse 70  Resp 20  Ht 6\' 1"  (1.854 m)  Wt 200 lb (90.719 kg)  BMI 26.39 kg/m2  SpO2 98%  General:  Well-appearing  Chest:   clear  CV:   Regular rate and rhythm  Incisions:  Completely healed, sternum is stable  Abdomen:  Soft and nontender  Extremities:  Warm and well-perfused  Diagnostic Tests:  n/a   Impression:  Patient is doing very well more than one year status post coronary artery bypass grafting 3.  Plan:  The patient has been reminded regarding the potential benefits of regular exercise and eating a "heart healthy diet". The impact of lipid management as been discussed. The patient's blood pressure is slightly elevated in our office today. He  will continue to monitor his blood pressure carefully since he has recently been taken off of some of this hypertension medications. In the future the patient will call and return to see [place] as needed. All of his questions have been addressed.  I spent in excess of 15 minutes during the conduct of this office consultation and >50% of this time involved direct face-to-face encounter with the patient for counseling and/or coordination of their care.    Valentina Gu. [name], MD 07/02/2015 4:42 PM

## 2015-07-02 NOTE — Patient Instructions (Signed)
The patient is reminded of the numerous potential long-term benefits of regular exercise and adherence to a "heart healthy" diet  

## 2015-07-13 ENCOUNTER — Ambulatory Visit (INDEPENDENT_AMBULATORY_CARE_PROVIDER_SITE_OTHER): Payer: Medicare Other | Admitting: Family Medicine

## 2015-07-13 DIAGNOSIS — E538 Deficiency of other specified B group vitamins: Secondary | ICD-10-CM

## 2015-07-13 MED ORDER — CYANOCOBALAMIN 1000 MCG/ML IJ SOLN
1000.0000 ug | Freq: Once | INTRAMUSCULAR | Status: AC
  Administered 2015-07-13: 1000 ug via INTRAMUSCULAR

## 2015-08-10 ENCOUNTER — Ambulatory Visit (INDEPENDENT_AMBULATORY_CARE_PROVIDER_SITE_OTHER): Payer: Medicare Other | Admitting: Family Medicine

## 2015-08-10 DIAGNOSIS — Z23 Encounter for immunization: Secondary | ICD-10-CM

## 2015-08-10 DIAGNOSIS — E538 Deficiency of other specified B group vitamins: Secondary | ICD-10-CM | POA: Diagnosis not present

## 2015-08-10 MED ORDER — CYANOCOBALAMIN 1000 MCG/ML IJ SOLN
1000.0000 ug | Freq: Once | INTRAMUSCULAR | Status: AC
  Administered 2015-08-10: 1000 ug via INTRAMUSCULAR

## 2015-08-10 MED ORDER — PRAVASTATIN SODIUM 40 MG PO TABS: 40.0000 mg | ORAL_TABLET | Freq: Every day | ORAL | Status: DC

## 2015-08-10 NOTE — Addendum Note (Signed)
Addended by: Clyde Lundborg A on: 08/10/2015 10:22 AM   Modules accepted: Orders

## 2015-08-20 ENCOUNTER — Encounter: Payer: Self-pay | Admitting: Family Medicine

## 2015-08-20 ENCOUNTER — Ambulatory Visit (INDEPENDENT_AMBULATORY_CARE_PROVIDER_SITE_OTHER): Payer: Medicare Other | Admitting: Family Medicine

## 2015-08-20 VITALS — BP 140/90 | HR 62 | Temp 98.0°F | Wt 202.0 lb

## 2015-08-20 DIAGNOSIS — I10 Essential (primary) hypertension: Secondary | ICD-10-CM

## 2015-08-20 DIAGNOSIS — I2510 Atherosclerotic heart disease of native coronary artery without angina pectoris: Secondary | ICD-10-CM | POA: Diagnosis not present

## 2015-08-20 DIAGNOSIS — E785 Hyperlipidemia, unspecified: Secondary | ICD-10-CM

## 2015-08-20 LAB — CBC
HCT: 37 % — ABNORMAL LOW (ref 39.0–52.0)
Hemoglobin: 11.8 g/dL — ABNORMAL LOW (ref 13.0–17.0)
MCHC: 31.9 g/dL (ref 30.0–36.0)
MCV: 79.3 fl (ref 78.0–100.0)
Platelets: 182 10*3/uL (ref 150.0–400.0)
RBC: 4.66 Mil/uL (ref 4.22–5.81)
RDW: 17 % — ABNORMAL HIGH (ref 11.5–15.5)
WBC: 4.4 10*3/uL (ref 4.0–10.5)

## 2015-08-20 LAB — COMPREHENSIVE METABOLIC PANEL
ALT: 13 U/L (ref 0–53)
AST: 19 U/L (ref 0–37)
Albumin: 3.7 g/dL (ref 3.5–5.2)
Alkaline Phosphatase: 63 U/L (ref 39–117)
BUN: 19 mg/dL (ref 6–23)
CO2: 26 mEq/L (ref 19–32)
Calcium: 9.4 mg/dL (ref 8.4–10.5)
Chloride: 108 mEq/L (ref 96–112)
Creatinine, Ser: 0.96 mg/dL (ref 0.40–1.50)
GFR: 80.7 mL/min (ref >60.00)
Glucose, Bld: 87 mg/dL (ref 70–99)
Potassium: 5.2 mEq/L — ABNORMAL HIGH (ref 3.5–5.1)
Sodium: 143 mEq/L (ref 135–145)
Total Bilirubin: 0.4 mg/dL (ref 0.2–1.2)
Total Protein: 7.1 g/dL (ref 6.0–8.3)

## 2015-08-20 MED ORDER — TELMISARTAN 20 MG PO TABS: 20.0000 mg | ORAL_TABLET | Freq: Every day | ORAL | Status: DC

## 2015-08-20 NOTE — Progress Notes (Signed)
[name], MD  Subjective:  Timothy Gallagher is a 79 y.o. year old very pleasant male patient who presents for/with See problem oriented charting ROS- no chest pain, shortness of breath, headache, blurry vision  Past Medical History-  Patient Active Problem List   Diagnosis Date Noted  . CAD (coronary artery disease) s/p CABG x4 05/19/2014    Priority: High  . Insomnia 11/22/2014    Priority: Medium  . Anemia 07/20/2014    Priority: Medium  . BPH (benign prostatic hyperplasia) 12/05/2013    Priority: Medium  . Hyperlipidemia 03/13/2008    Priority: Medium  . B12 DEFICIENCY 06/07/2007    Priority: Medium  . Essential hypertension 04/09/2007    Priority: Medium  . Internal bleeding hemorrhoids 01/22/2015    Priority: Low  . Palpitations 02/11/2013    Priority: Low  . OSTEOARTHRITIS, WRIST, RIGHT 08/05/2010    Priority: Low  . ACNE ROSACEA 06/27/2009    Priority: Low  . ESOPHAGEAL STRICTURE 04/27/2009    Priority: Low  . ACTINIC KERATOSIS, HEAD 04/18/2009    Priority: Low  . NECK PAIN 09/14/2008    Priority: Low  . NEUROPATHY, IDIOPATHIC PERIPHERAL NEC 08/11/2007    Priority: Low  . Irritable bowel syndrome 08/11/2007    Priority: Low  . ALLERGIC RHINITIS 04/09/2007    Priority: Low    Medications- reviewed and updated Current Outpatient Prescriptions  Medication Sig Dispense Refill  . aspirin 81 MG EC tablet Take 1 tablet (81 mg total) by mouth daily.    . cetirizine (ZYRTEC) 10 MG tablet Take 10 mg by mouth daily as needed for allergies.     . cyanocobalamin (,VITAMIN B-12,) 1000 MCG/ML injection Inject 1.62ml every 3 weeks 10 mL 3  . fluticasone (FLONASE) 50 MCG/ACT nasal spray Place 1-2 sprays into both nostrils daily as needed for allergies. 16 g 5  . metoprolol succinate (TOPROL-XL) 25 MG 24 hr tablet Take 1 tablet (25 mg total) by mouth daily. 90 tablet 3  . Multiple Vitamin (MULTIVITAMIN WITH MINERALS) TABS tablet Take 1 tablet by mouth daily.    .  pravastatin (PRAVACHOL) 40 MG tablet Take 1 tablet (40 mg total) by mouth daily. 30 tablet 5  . acetaminophen (TYLENOL) 500 MG tablet Take 500 mg by mouth daily as needed (for pain).     [name] Kitchen NITROSTAT 0.4 MG SL tablet Place 0.4 mg under the tongue every 5 (five) minutes as needed for chest pain.     [name] Kitchen telmisartan (MICARDIS) 20 MG tablet Take 1 tablet (20 mg total) by mouth daily. 30 tablet 5   No current facility-administered medications for this visit.    Objective: BP 140/90 mmHg  Pulse 62  Temp(Src) 98 F (36.7 C)  Wt 202 lb (91.627 kg)  My BP recheck 142/92 Gen: NAD, resting comfortably CV: RRR no murmurs rubs or gallops Midline chest scar Lungs: CTAB no crackles, wheeze, rhonchi Abdomen: soft/nontender/nondistended/normal bowel sounds. No rebound or guarding.  Ext: no edema Skin: warm, dry Neuro: grossly normal, moves all extremities, normal gait  Assessment/Plan:  Hyperlipidemia S: increased pravastatin to 40mg  previously with LDL at 100 with history CAD. Patient states after he takes pill low energy for a few hours afterwards Lab Results  Component Value Date   CHOL 221* 08/29/2013   HDL 42.20 08/29/2013   LDLCALC 128* 09/28/2012   LDLDIRECT 100.0 12/25/2014   TRIG 158.0* 08/29/2013   CHOLHDL 5 08/29/2013  A/P: check LDL today, hopeful at least <100 LDL though 70  more ideal   Essential hypertension S: Blood pressure elevated last 2 visits. Compliant with Metoprolol 25mg  24 hr. Home readings less than 140/90 but does not bring log BP Readings from Last 3 Encounters:  08/20/15 140/90  07/02/15 156/90  06/19/15 132/84  A/P: poor control. Prior was fatigued and with some orthostasis with telmisartan 80mg . We will continue metoprolol and add telmisartan 20mg . See avs  CAD (coronary artery disease) s/p CABG x4 S: controlled and asymptomatic. Continues on aspirin daily. Compliant with statin despite some side effects. Has seen cardiology and now cleared from  cardiothoracic perspective. Walks around [employer] without difficulty, active but no clear regular exercise A/P: continue aspirin and statin, advised regular exercise  1 month BP check then hopefully q4 months  Update labs nonfasting Orders Placed This Encounter  Procedures  . CBC    [place]  . Comprehensive metabolic panel    [place]  . LDL cholesterol, direct    [place]    Meds ordered this encounter  Medications  . telmisartan (MICARDIS) 20 MG tablet    Sig: Take 1 tablet (20 mg total) by mouth daily.    Dispense:  30 tablet    Refill:  5

## 2015-08-20 NOTE — Assessment & Plan Note (Addendum)
S: increased pravastatin to 40mg  previously with LDL at 100 with history CAD. Patient states after he takes pill low energy for a few hours afterwards Lab Results  Component Value Date   CHOL 221* 08/29/2013   HDL 42.20 08/29/2013   LDLCALC 128* 09/28/2012   LDLDIRECT 100.0 12/25/2014   TRIG 158.0* 08/29/2013   CHOLHDL 5 08/29/2013  A/P: check LDL today, hopeful at least <100 LDL though 70 more ideal

## 2015-08-20 NOTE — Assessment & Plan Note (Signed)
S: controlled and asymptomatic. Continues on aspirin daily. Compliant with statin despite some side effects. Has seen cardiology and now cleared from cardiothoracic perspective. Walks around [place] [employer] without difficulty, active but no clear regular exercise A/P: continue aspirin and statin, advised regular exercise

## 2015-08-20 NOTE — Patient Instructions (Signed)
Blood pressure above goal of less than140/90 at last 2 visits. Let's have you take a very small dose of telmisartan at 20mg  (you used to take 80mg ).   I want you to check once a day at home. See me in 1 month and bring a copy of your blood pressure readings and also bring your cuff with you. If your blood pressure gets down to less than 100- stop the pill and let me know or if you get lightheaded let me know

## 2015-08-20 NOTE — Assessment & Plan Note (Addendum)
S: Blood pressure elevated last 2 visits. Compliant with Metoprolol 25mg  24 hr. Home readings less than 140/90 but does not bring log BP Readings from Last 3 Encounters:  08/20/15 140/90  07/02/15 156/90  06/19/15 132/84  A/P: poor control. Prior was fatigued and with some orthostasis with telmisartan 80mg . We will continue metoprolol and add telmisartan 20mg . See avs

## 2015-08-21 LAB — LDL CHOLESTEROL, DIRECT: Direct LDL: 97 mg/dL

## 2015-09-07 ENCOUNTER — Ambulatory Visit: Payer: Medicare Other | Admitting: Family Medicine

## 2015-09-10 ENCOUNTER — Encounter: Payer: Self-pay | Admitting: Family Medicine

## 2015-09-10 ENCOUNTER — Telehealth: Payer: Self-pay | Admitting: Family Medicine

## 2015-09-10 ENCOUNTER — Ambulatory Visit (INDEPENDENT_AMBULATORY_CARE_PROVIDER_SITE_OTHER): Payer: Medicare Other | Admitting: Family Medicine

## 2015-09-10 VITALS — BP 142/80 | HR 60 | Temp 97.9°F | Wt 202.0 lb

## 2015-09-10 DIAGNOSIS — I10 Essential (primary) hypertension: Secondary | ICD-10-CM | POA: Diagnosis not present

## 2015-09-10 MED ORDER — TELMISARTAN 40 MG PO TABS: 40.0000 mg | ORAL_TABLET | Freq: Every day | ORAL | Status: DC

## 2015-09-10 NOTE — Progress Notes (Signed)
Pre visit review using our clinic review tool, if applicable. No additional management support is needed unless otherwise documented below in the visit note. 

## 2015-09-10 NOTE — Patient Instructions (Signed)
Increase your telmisartan to 40mg  Continue metoprolol  See me back within 2 weeks and we will recheck your blood pressure Buy a new cuff as yours is not working right  Bring your cuff to next visit

## 2015-09-10 NOTE — Telephone Encounter (Signed)
Timothy Gallagher - [facility]  Patient Name: Timothy Gallagher  DOB: 04-27-1936    Initial Comment Caller states woke up 12:30 am 172/99 BP; 159/94 @ 5:30 am, now back to 133/73 @ 6:45 after taking extra pill; has appt 11/04; had headache then, but not now; should be seen today?    Nurse Assessment  Nurse: Wynetta Emery, RN, Baker Janus Date/Time Eilene Ghazi Time): 09/10/2015 9:29:51 AM  Confirm and document reason for call. If symptomatic, describe symptoms. ---Timothy Gallagher woke up at 1230 am headache 172/99 and 159/94 @ 5:30 am, this am it is now back to 133/73 @ 6:45 after taking extra pill headache is gone after taking blood pressure medication and tylenol  Has the patient traveled out of the country within the last 30 days? ---No  Does the patient have any new or worsening symptoms? ---Yes  Will a triage be completed? ---Yes  Related visit to physician within the last 2 weeks? ---No  Does the PT have any chronic conditions? (i.e. diabetes, asthma, etc.) ---Yes  List chronic conditions. ---HTN     Guidelines    Guideline Title Affirmed Question Affirmed Notes  High Blood Pressure BP ? 160/100    Final Disposition User   See Physician within 4 Hours (or PCP triage) Wynetta Emery, RN, Baker Janus    Comments  appt for 09-10-2015 415pm with Dr. [name], MD   Referrals  REFERRED TO PCP OFFICE   Disagree/Comply: Comply

## 2015-09-10 NOTE — Progress Notes (Signed)
[name], MD  Subjective:  Timothy Gallagher is a 79 y.o. year old very pleasant male patient who presents for/with See problem oriented charting ROS- no extremity or facial weakness, no slurred words. No trouble swallowing or speaking. No chest pain or shortness of breath or headache or blurry vision.   Past Medical History-  Patient Active Problem List   Diagnosis Date Noted  . CAD (coronary artery disease) s/p CABG x4 05/19/2014    Priority: High  . Insomnia 11/22/2014    Priority: Medium  . Anemia 07/20/2014    Priority: Medium  . BPH (benign prostatic hyperplasia) 12/05/2013    Priority: Medium  . Hyperlipidemia 03/13/2008    Priority: Medium  . B12 DEFICIENCY 06/07/2007    Priority: Medium  . Essential hypertension 04/09/2007    Priority: Medium  . Internal bleeding hemorrhoids 01/22/2015    Priority: Low  . Palpitations 02/11/2013    Priority: Low  . OSTEOARTHRITIS, WRIST, RIGHT 08/05/2010    Priority: Low  . ACNE ROSACEA 06/27/2009    Priority: Low  . ESOPHAGEAL STRICTURE 04/27/2009    Priority: Low  . ACTINIC KERATOSIS, HEAD 04/18/2009    Priority: Low  . NECK PAIN 09/14/2008    Priority: Low  . NEUROPATHY, IDIOPATHIC PERIPHERAL NEC 08/11/2007    Priority: Low  . Irritable bowel syndrome 08/11/2007    Priority: Low  . ALLERGIC RHINITIS 04/09/2007    Priority: Low    Medications- reviewed and updated Current Outpatient Prescriptions  Medication Sig Dispense Refill  . acetaminophen (TYLENOL) 500 MG tablet Take 500 mg by mouth daily as needed (for pain).     [name] Kitchen aspirin 81 MG EC tablet Take 1 tablet (81 mg total) by mouth daily.    . cetirizine (ZYRTEC) 10 MG tablet Take 10 mg by mouth daily as needed for allergies.     . cyanocobalamin (,VITAMIN B-12,) 1000 MCG/ML injection Inject 1.89ml every 3 weeks 10 mL 3  . fluticasone (FLONASE) 50 MCG/ACT nasal spray Place 1-2 sprays into both nostrils daily as needed for allergies. 16 g 5  . metoprolol succinate  (TOPROL-XL) 25 MG 24 hr tablet Take 1 tablet (25 mg total) by mouth daily. 90 tablet 3  . Multiple Vitamin (MULTIVITAMIN WITH MINERALS) TABS tablet Take 1 tablet by mouth daily.    [name] Kitchen NITROSTAT 0.4 MG SL tablet Place 0.4 mg under the tongue every 5 (five) minutes as needed for chest pain.     . pravastatin (PRAVACHOL) 40 MG tablet Take 1 tablet (40 mg total) by mouth daily. 30 tablet 5  . telmisartan (MICARDIS) 20 MG tablet Take 1 tablet (20 mg total) by mouth daily. 30 tablet 5   Objective: BP 142/80 mmHg  Pulse 60  Temp(Src) 97.9 F (36.6 C) (Oral)  Wt 202 lb (91.627 kg)  SpO2 98% Gen: NAD, resting comfortably CV: RRR no murmurs rubs or gallops Lungs: CTAB no crackles, wheeze, rhonchi Abdomen: soft/nontender/nondistended/normal bowel sounds. No rebound or guarding.  Ext: no edema Skin: warm, dry Neuro: CN II-XII intact, sensation and reflexes normal throughout, 5/5 muscle strength in bilateral upper and lower extremities. Normal finger to nose. Normal rapid alternating movements. Normal gait.   Assessment/Plan:  Essential hypertension S: poorly controlled. At least 1/5 home readings >140. None below 120. This AM BP was up to 172/99 around 1 AM. Mild dizziness when going to bathroom prompted him to check. Went back to sleep and 159/94 at 5:30 AM so took BP meds early and Bp came  down to 133/73. Had mild headache which resolved when BP improved. Looked at home cuff today and patient has to hold it shut since velcro does not work anymore.  BP Readings from Last 3 Encounters:  09/10/15 142/80  08/20/15 140/90  07/02/15 156/90  A/P:Continue metprolol 25mg  XL. Titrate telmisartan up to 40mg . In regards to headaches, patient has a normal neurological exam and improved when BP improved. We will plan to follow up next Friday. Purchase new cuff and bring to visit as well as monitor at home.     Strict Return precautions advised.   Meds ordered this encounter  Medications  . telmisartan  (MICARDIS) 40 MG tablet    Sig: Take 1 tablet (40 mg total) by mouth daily.    Dispense:  30 tablet    Refill:  5

## 2015-09-10 NOTE — Telephone Encounter (Signed)
Pt scheduled today to see Dr. Hunter. 

## 2015-09-10 NOTE — Assessment & Plan Note (Signed)
S: poorly controlled. At least 1/5 home readings >140. None below 120. This AM BP was up to 172/99 around 1 AM. Mild dizziness when going to bathroom prompted him to check. Went back to sleep and 159/94 at 5:30 AM so took BP meds early and Bp came down to 133/73. Had mild headache which resolved when BP improved. Looked at home cuff today and patient has to hold it shut since velcro does not work anymore.  BP Readings from Last 3 Encounters:  09/10/15 142/80  08/20/15 140/90  07/02/15 156/90  A/P:Continue metprolol 25mg  XL. Titrate telmisartan up to 40mg . In regards to headaches, patient has a normal neurological exam and improved when BP improved. We will plan to follow up next Friday. Purchase new cuff and bring to visit as well as monitor at home.

## 2015-09-21 ENCOUNTER — Ambulatory Visit: Payer: Medicare Other | Admitting: Internal Medicine

## 2015-09-24 ENCOUNTER — Ambulatory Visit (INDEPENDENT_AMBULATORY_CARE_PROVIDER_SITE_OTHER): Payer: Medicare Other | Admitting: Family Medicine

## 2015-09-24 VITALS — BP 136/86 | HR 70 | Temp 98.3°F | Wt 204.0 lb

## 2015-09-24 DIAGNOSIS — E538 Deficiency of other specified B group vitamins: Secondary | ICD-10-CM | POA: Diagnosis not present

## 2015-09-24 DIAGNOSIS — E785 Hyperlipidemia, unspecified: Secondary | ICD-10-CM | POA: Diagnosis not present

## 2015-09-24 DIAGNOSIS — I10 Essential (primary) hypertension: Secondary | ICD-10-CM

## 2015-09-24 MED ORDER — CYANOCOBALAMIN 1000 MCG/ML IJ SOLN
1000.0000 ug | Freq: Once | INTRAMUSCULAR | Status: AC
  Administered 2015-09-24: 1000 ug via INTRAMUSCULAR

## 2015-09-24 NOTE — Assessment & Plan Note (Signed)
S: controlled. on Metoprolol 25 mg XL, telmisartan 40mg  (increased from 20mg  last visit). Home readings 125/70 this morning. Usually 120s over 70s/80s. Nothing over 140.  BP Readings from Last 3 Encounters:  09/24/15 136/86  09/10/15 142/80  08/20/15 140/90  A/P:Continue current meds as now at goal. Encouraged to bring home cuff to next visit again

## 2015-09-24 NOTE — Assessment & Plan Note (Signed)
S: continues to be tired few hrs after statin each day. On pravastatin 20mg . LDL controlled  LDLDIRECT 97.0 08/20/2015  A/P: continue statin- most tolerable SE he has had, wish could get LDL <70 but SE have prevented

## 2015-09-24 NOTE — Progress Notes (Signed)
[name], MD  Subjective:  Timothy Gallagher is a 79 y.o. year old very pleasant male patient who presents for/with See problem oriented charting ROS- No chest pain or shortness of breath. No  blurry vision. Short lived AM headache about once a week generally when he has congestion.   Past Medical History-  Patient Active Problem List   Diagnosis Date Noted  . CAD (coronary artery disease) s/p CABG x4 05/19/2014    Priority: High  . Insomnia 11/22/2014    Priority: Medium  . Anemia 07/20/2014    Priority: Medium  . BPH (benign prostatic hyperplasia) 12/05/2013    Priority: Medium  . Hyperlipidemia 03/13/2008    Priority: Medium  . B12 DEFICIENCY 06/07/2007    Priority: Medium  . Essential hypertension 04/09/2007    Priority: Medium  . Internal bleeding hemorrhoids 01/22/2015    Priority: Low  . Palpitations 02/11/2013    Priority: Low  . OSTEOARTHRITIS, WRIST, RIGHT 08/05/2010    Priority: Low  . ACNE ROSACEA 06/27/2009    Priority: Low  . ESOPHAGEAL STRICTURE 04/27/2009    Priority: Low  . ACTINIC KERATOSIS, HEAD 04/18/2009    Priority: Low  . NECK PAIN 09/14/2008    Priority: Low  . NEUROPATHY, IDIOPATHIC PERIPHERAL NEC 08/11/2007    Priority: Low  . Irritable bowel syndrome 08/11/2007    Priority: Low  . ALLERGIC RHINITIS 04/09/2007    Priority: Low    Medications- reviewed and updated Current Outpatient Prescriptions  Medication Sig Dispense Refill  . acetaminophen (TYLENOL) 500 MG tablet Take 500 mg by mouth daily as needed (for pain).     [name] Kitchen aspirin 81 MG EC tablet Take 1 tablet (81 mg total) by mouth daily.    . cetirizine (ZYRTEC) 10 MG tablet Take 10 mg by mouth daily as needed for allergies.     . cyanocobalamin (,VITAMIN B-12,) 1000 MCG/ML injection Inject 1.36ml every 3 weeks 10 mL 3  . fluticasone (FLONASE) 50 MCG/ACT nasal spray Place 1-2 sprays into both nostrils daily as needed for allergies. 16 g 5  . metoprolol succinate (TOPROL-XL) 25 MG 24 hr  tablet Take 1 tablet (25 mg total) by mouth daily. 90 tablet 3  . Multiple Vitamin (MULTIVITAMIN WITH MINERALS) TABS tablet Take 1 tablet by mouth daily.    [name] Kitchen NITROSTAT 0.4 MG SL tablet Place 0.4 mg under the tongue every 5 (five) minutes as needed for chest pain.     . pravastatin (PRAVACHOL) 40 MG tablet Take 1 tablet (40 mg total) by mouth daily. 30 tablet 5  . telmisartan (MICARDIS) 40 MG tablet Take 1 tablet (40 mg total) by mouth daily. 30 tablet 5   No current facility-administered medications for this visit.    Objective: BP 136/86 mmHg  Pulse 70  Temp(Src) 98.3 F (36.8 C)  Wt 204 lb (92.534 kg) Gen: NAD, resting comfortably CV: RRR no murmurs rubs or gallops Lungs: CTAB no crackles, wheeze, rhonchi Abdomen: soft/nontender/nondistended/normal bowel sounds.  Ext: no edema Skin: warm, dry Neuro: grossly normal, moves all extremities, normal gait  Assessment/Plan:  Hyperlipidemia S: continues to be tired few hrs after statin each day. On pravastatin 20mg . LDL controlled  LDLDIRECT 97.0 08/20/2015  A/P: continue statin- most tolerable SE he has had, wish could get LDL <70 but SE have prevented   Essential hypertension S: controlled. on Metoprolol 25 mg XL, telmisartan 40mg  (increased from 20mg  last visit). Home readings 125/70 this morning. Usually 120s over 70s/80s. Nothing over 140.  BP Readings from Last 3 Encounters:  09/24/15 136/86  09/10/15 142/80  08/20/15 140/90  A/P:Continue current meds as now at goal. Encouraged to bring home cuff to next visit again  Occasional headaches in AM associated with sinus congestion 1x a week- discussed if has new or worsening pattern to return to care  4 months f/u with soonerReturn precautions advised.   Meds ordered this encounter  Medications  . cyanocobalamin ((VITAMIN B-12)) injection 1,000 mcg    Sig:

## 2015-09-24 NOTE — Patient Instructions (Signed)
4 month follow up unless you need [place] sooner  Blood pressure looks much better. Keep checking at home at least 2-3x a week with goal <140/90  Let us know if headaches increase in frequency or intensity

## 2015-10-24 ENCOUNTER — Ambulatory Visit (INDEPENDENT_AMBULATORY_CARE_PROVIDER_SITE_OTHER): Payer: Medicare Other | Admitting: Family Medicine

## 2015-10-24 DIAGNOSIS — E538 Deficiency of other specified B group vitamins: Secondary | ICD-10-CM

## 2015-10-24 MED ORDER — CYANOCOBALAMIN 1000 MCG/ML IJ SOLN
1000.0000 ug | Freq: Once | INTRAMUSCULAR | Status: AC
  Administered 2015-10-24: 1000 ug via INTRAMUSCULAR

## 2015-11-08 ENCOUNTER — Other Ambulatory Visit: Payer: Self-pay

## 2015-11-08 MED ORDER — METOPROLOL SUCCINATE ER 25 MG PO TB24: 25.0000 mg | ORAL_TABLET | Freq: Every day | ORAL | Status: DC

## 2015-11-08 MED ORDER — PRAVASTATIN SODIUM 40 MG PO TABS: 40.0000 mg | ORAL_TABLET | Freq: Every day | ORAL | Status: DC

## 2015-11-08 MED ORDER — FLUTICASONE PROPIONATE 50 MCG/ACT NA SUSP: 1.0000 | Freq: Every day | NASAL | Status: DC | PRN

## 2015-11-08 MED ORDER — TELMISARTAN 40 MG PO TABS: 40.0000 mg | ORAL_TABLET | Freq: Every day | ORAL | Status: DC

## 2015-11-09 ENCOUNTER — Other Ambulatory Visit: Payer: Self-pay

## 2015-11-21 ENCOUNTER — Ambulatory Visit (INDEPENDENT_AMBULATORY_CARE_PROVIDER_SITE_OTHER): Payer: Medicare Other | Admitting: Family Medicine

## 2015-11-21 DIAGNOSIS — E538 Deficiency of other specified B group vitamins: Secondary | ICD-10-CM | POA: Diagnosis not present

## 2015-11-21 MED ORDER — CYANOCOBALAMIN 1000 MCG/ML IJ SOLN
1000.0000 ug | Freq: Once | INTRAMUSCULAR | Status: AC
  Administered 2015-11-21: 1000 ug via INTRAMUSCULAR

## 2015-11-22 ENCOUNTER — Encounter: Payer: Self-pay | Admitting: Internal Medicine

## 2015-11-22 ENCOUNTER — Ambulatory Visit (INDEPENDENT_AMBULATORY_CARE_PROVIDER_SITE_OTHER): Payer: Medicare Other | Admitting: Internal Medicine

## 2015-11-22 VITALS — BP 130/80 | HR 64 | Ht 73.0 in | Wt 206.6 lb

## 2015-11-22 DIAGNOSIS — K648 Other hemorrhoids: Secondary | ICD-10-CM

## 2015-11-22 NOTE — Progress Notes (Signed)
Subjective:    Patient ID: Timothy Gallagher, male    DOB: 03/02/1936, 79 y.o.   MRN: [id number]  chief complaint: Rectal bleeding HPI  the patient is a nice elderly man previously followed by Dr. Patterson, I met him last year, he was having rectal bleeding and I diagnosed him with internal hemorrhoids and anoscopy. They were on the smaller side, hydrocortisone suppositories were prescribed Benefiber was recommended. He took that treatment, and despite having more regular bowel movements he still has rectal bleeding about once a week. There is some anal irritation and perhaps some fecal soiling as well times.  Medications, allergies, past medical history, past surgical history, family history and social history are reviewed and updated in the EMR.  Review of Systems  as above    Objective:   Physical Exam BP 160/90 mmHg  Pulse 64  Ht 6' 1" (1.854 m)  Wt 206 lb 9.6 oz (93.713 kg)  BMI 27.26 kg/m2 Rectal - anoderm - mild erythema/pink changes  DRE NL  Anoscopy - inflamed Gr 2 internal hemorrhoids all positions  PROCEDURE NOTE: The patient presents with symptomatic grade 2  hemorrhoids, requesting rubber band ligation of his/her hemorrhoidal disease.  All risks, benefits and alternative forms of therapy were described and informed consent was obtained.   The anorectum was pre-medicated with Percent lidocaine and 0.125% nitroglycerin  The decision was made to band the right anterior internal hemorrhoid, and the [facility] System was used to perform band ligation without complication.  Digital anorectal examination was then performed to assure proper positioning of the band, and to adjust the banded tissue as required.  The patient was discharged home without pain or other issues.  Dietary and behavioral recommendations were given and along with follow-up instructions.      The patient will return in 2-3 weeks for  follow-up and possible additional banding as required. No  complications were encountered and the patient tolerated the procedure well.     Assessment & Plan:  Internal bleeding hemorrhoids Please symptoms are bothersome to him and he requested more permanent treatment. So hemorrhoidal banding was started today. Right anterior columns banded. Return 2-3 weeks for repeat banding most likely.    

## 2015-11-22 NOTE — Patient Instructions (Addendum)
  HEMORRHOID BANDING PROCEDURE    FOLLOW-UP CARE   1. The procedure you have had should have been relatively painless since the banding of the area involved does not have nerve endings and there is no pain sensation.  The rubber band cuts off the blood supply to the hemorrhoid and the band may fall off as soon as 48 hours after the banding (the band may occasionally be seen in the toilet bowl following a bowel movement). You may notice a temporary feeling of fullness in the rectum which should respond adequately to plain Tylenol or Motrin.  2. Following the banding, avoid strenuous exercise that evening and resume full activity the next day.  A sitz bath (soaking in a warm tub) or bidet is soothing, and can be useful for cleansing the area after bowel movements.     3. To avoid constipation, take two tablespoons of natural wheat bran, natural oat bran, flax, Benefiber or any over the counter fiber supplement and increase your water intake to 7-8 glasses daily.    4. Unless you have been prescribed anorectal medication, do not put anything inside your rectum for two weeks: No suppositories, enemas, fingers, etc.  5. Occasionally, you may have more bleeding than usual after the banding procedure.  This is often from the untreated hemorrhoids rather than the treated one.  Don't be concerned if there is a tablespoon or so of blood.  If there is more blood than this, lie flat with your bottom higher than your head and apply an ice pack to the area. If the bleeding does not stop within a half an hour or if you feel faint, call our office at [phone] or go to the emergency room.  6. Problems are not common; however, if there is a substantial amount of bleeding, severe pain, chills, fever or difficulty passing urine (very rare) or other problems, you should call us at [phone] or report to the nearest emergency room.  7. Do not stay seated continuously for more than 2-3 hours for a day or  two after the procedure.  Tighten your buttock muscles 10-15 times every two hours and take 10-15 deep breaths every 1-2 hours.  Do not spend more than a few minutes on the toilet if you cannot empty your bowel; instead re-visit the toilet at a later time.    We will see you back for additional banding on January 25th at 9:30AM.   I appreciate the opportunity to care for you. Silvano Rusk, MD, [facility]

## 2015-11-22 NOTE — Assessment & Plan Note (Signed)
Please symptoms are bothersome to him and he requested more permanent treatment. So hemorrhoidal banding was started today. Right anterior columns banded. Return 2-3 weeks for repeat banding most likely.

## 2015-12-12 ENCOUNTER — Encounter: Payer: Self-pay | Admitting: Internal Medicine

## 2015-12-12 ENCOUNTER — Ambulatory Visit (INDEPENDENT_AMBULATORY_CARE_PROVIDER_SITE_OTHER): Payer: Medicare Other | Admitting: Internal Medicine

## 2015-12-12 VITALS — BP 152/88 | HR 80 | Ht 73.0 in | Wt 206.0 lb

## 2015-12-12 DIAGNOSIS — K648 Other hemorrhoids: Secondary | ICD-10-CM | POA: Diagnosis not present

## 2015-12-12 NOTE — Progress Notes (Signed)
   PROCEDURE NOTE: The patient presents with symptomatic grade 2  hemorrhoids, requesting rubber band ligation of his/her hemorrhoidal disease.  All risks, benefits and alternative forms of therapy were described and informed consent was obtained.   The anorectum was pre-medicated with NTG 0.125% and lidocaine 5% The decision was made to band the RP and LL internal hemorrhoids, and the [place] was used to perform band ligation without complication.  Digital anorectal examination was then performed to assure proper positioning of the band, and to adjust the banded tissue as required.  The patient was discharged home without pain or other issues.  Dietary and behavioral recommendations were given and along with follow-up instructions.      The patient will return as needed for  follow-up and possible additional banding as required. No complications were encountered and the patient tolerated the procedure well.

## 2015-12-12 NOTE — Patient Instructions (Addendum)
HEMORRHOID BANDING PROCEDURE    FOLLOW-UP CARE   1. The procedure you have had should have been relatively painless since the banding of the area involved does not have nerve endings and there is no pain sensation.  The rubber band cuts off the blood supply to the hemorrhoid and the band may fall off as soon as 48 hours after the banding (the band may occasionally be seen in the toilet bowl following a bowel movement). You may notice a temporary feeling of fullness in the rectum which should respond adequately to plain Tylenol or Motrin.  2. Following the banding, avoid strenuous exercise that evening and resume full activity the next day.  A sitz bath (soaking in a warm tub) or bidet is soothing, and can be useful for cleansing the area after bowel movements.     3. To avoid constipation, take two tablespoons of natural wheat bran, natural oat bran, flax, Benefiber or any over the counter fiber supplement and increase your water intake to 7-8 glasses daily.    4. Unless you have been prescribed anorectal medication, do not put anything inside your rectum for two weeks: No suppositories, enemas, fingers, etc.  5. Occasionally, you may have more bleeding than usual after the banding procedure.  This is often from the untreated hemorrhoids rather than the treated one.  Don't be concerned if there is a tablespoon or so of blood.  If there is more blood than this, lie flat with your bottom higher than your head and apply an ice pack to the area. If the bleeding does not stop within a half an hour or if you feel faint, call our office at [phone] or go to the emergency room.  6. Problems are not common; however, if there is a substantial amount of bleeding, severe pain, chills, fever or difficulty passing urine (very rare) or other problems, you should call us at [phone] or report to the nearest emergency room.  7. Do not stay seated continuously for more than 2-3 hours for a day or two  after the procedure.  Tighten your buttock muscles 10-15 times every two hours and take 10-15 deep breaths every 1-2 hours.  Do not spend more than a few minutes on the toilet if you cannot empty your bowel; instead re-visit the toilet at a later time.    Follow up as needed.   I appreciate the opportunity to care for you. Carl Gessner,MD, FACG 

## 2015-12-12 NOTE — Assessment & Plan Note (Signed)
RP/LL banded - he is better after banding of RA RTC prn

## 2015-12-19 ENCOUNTER — Ambulatory Visit (INDEPENDENT_AMBULATORY_CARE_PROVIDER_SITE_OTHER): Payer: Medicare Other | Admitting: Family Medicine

## 2015-12-19 DIAGNOSIS — E538 Deficiency of other specified B group vitamins: Secondary | ICD-10-CM

## 2015-12-19 MED ORDER — CYANOCOBALAMIN 1000 MCG/ML IJ SOLN
1000.0000 ug | Freq: Once | INTRAMUSCULAR | Status: AC
  Administered 2015-12-19: 1000 ug via INTRAMUSCULAR

## 2016-01-08 ENCOUNTER — Telehealth: Payer: Self-pay | Admitting: Family Medicine

## 2016-01-08 NOTE — Telephone Encounter (Signed)
See below Kathy. 

## 2016-01-08 NOTE — Telephone Encounter (Signed)
im ok moving up- same day Wednesday would work best for schedule

## 2016-01-08 NOTE — Telephone Encounter (Signed)
Is this ok to move appt up?

## 2016-01-08 NOTE — Telephone Encounter (Signed)
Pt moved appointment up one week on wed

## 2016-01-08 NOTE — Telephone Encounter (Signed)
Pt would like to move his appt up sooner, he has some sort of sinus issues making him "woozy" and lightheaded. Nose running. Only same day next week. Pt states next week is ok.

## 2016-01-16 ENCOUNTER — Ambulatory Visit: Payer: Medicare Other | Admitting: Family Medicine

## 2016-01-22 ENCOUNTER — Ambulatory Visit: Payer: Medicare Other | Admitting: Family Medicine

## 2016-02-08 ENCOUNTER — Ambulatory Visit: Payer: Medicare Other | Admitting: Family Medicine

## 2016-02-15 ENCOUNTER — Telehealth: Payer: Self-pay | Admitting: Family Medicine

## 2016-02-15 NOTE — Telephone Encounter (Signed)
Pt would like yo to know all his refills need to go to  [facility]  Pt doesn't need anything at present, but needs changed in the sysytem  Fax:  [phone]

## 2016-02-19 ENCOUNTER — Ambulatory Visit (INDEPENDENT_AMBULATORY_CARE_PROVIDER_SITE_OTHER): Payer: Medicare Other | Admitting: Family Medicine

## 2016-02-19 ENCOUNTER — Encounter: Payer: Self-pay | Admitting: Family Medicine

## 2016-02-19 VITALS — BP 140/72 | HR 68 | Temp 98.0°F | Wt 206.0 lb

## 2016-02-19 DIAGNOSIS — E538 Deficiency of other specified B group vitamins: Secondary | ICD-10-CM | POA: Diagnosis not present

## 2016-02-19 DIAGNOSIS — I10 Essential (primary) hypertension: Secondary | ICD-10-CM | POA: Diagnosis not present

## 2016-02-19 MED ORDER — CYANOCOBALAMIN 1000 MCG/ML IJ SOLN
1000.0000 ug | Freq: Once | INTRAMUSCULAR | Status: AC
  Administered 2016-02-19: 1000 ug via INTRAMUSCULAR

## 2016-02-19 NOTE — Assessment & Plan Note (Signed)
S:  When his blood pressure gets to around 100 he feels lightheaded. This usually occurs most mornings and fade after he rests and drinks some fluids. He does not sleep well and thinks it contributes but none of sleep medicines have worked for him in making this issue better. This has been going on for at least 2 years and common reason for him to come in for visit.  BP Readings from Last 3 Encounters:  02/19/16 140/72  12/12/15 152/88  11/22/15 130/80  A/P: we will leave his medications the same but move telmisartan to evening to see if makes a difference. Do not think we can titrate down further as would further increase cardiac risk and SBP already slightly above goal.  I doubt it will honestly. We have follow up on 26th to check in. Consider a1c though doubt this is hyperglycemia causing this. He will check HR with blood pressure to make sure not bradycardic. Not lightheaded with standing on exam today and did not complete orthostatics. May get more accurate picture at follow up which is in Am-consider neuro exam and orthostatics at that time.

## 2016-02-19 NOTE — Patient Instructions (Addendum)
I am sorry you feel lightheaded in the mornings. Seems to be when blood pressure is lower. Let's move your telmisartan to before bed to see if that makes a difference at all, continue metoprolol in the morning. I also want you to check your pulse to see if it is at least at 60 when you have your symptoms.   Let's follow up in 2-3 weeks to see if the change makes any difference

## 2016-02-19 NOTE — Progress Notes (Signed)
[name], MD  Subjective:  Timothy Gallagher is a 80 y.o. year old very pleasant male patient who presents for/with See problem oriented charting ROS- No chest pain or shortness of breath. No headache or blurry vision.   Past Medical History-  Patient Active Problem List   Diagnosis Date Noted  . CAD (coronary artery disease) s/p CABG x4 05/19/2014    Priority: High  . Insomnia 11/22/2014    Priority: Medium  . Anemia 07/20/2014    Priority: Medium  . BPH (benign prostatic hyperplasia) 12/05/2013    Priority: Medium  . Hyperlipidemia 03/13/2008    Priority: Medium  . B12 deficiency 06/07/2007    Priority: Medium  . Essential hypertension 04/09/2007    Priority: Medium  . Internal bleeding hemorrhoids 01/22/2015    Priority: Low  . Palpitations 02/11/2013    Priority: Low  . OSTEOARTHRITIS, WRIST, RIGHT 08/05/2010    Priority: Low  . ACNE ROSACEA 06/27/2009    Priority: Low  . ESOPHAGEAL STRICTURE 04/27/2009    Priority: Low  . ACTINIC KERATOSIS, HEAD 04/18/2009    Priority: Low  . NECK PAIN 09/14/2008    Priority: Low  . NEUROPATHY, IDIOPATHIC PERIPHERAL NEC 08/11/2007    Priority: Low  . Irritable bowel syndrome 08/11/2007    Priority: Low  . ALLERGIC RHINITIS 04/09/2007    Priority: Low    Medications- reviewed and updated Current Outpatient Prescriptions  Medication Sig Dispense Refill  . aspirin 81 MG EC tablet Take 1 tablet (81 mg total) by mouth daily.    . cetirizine (ZYRTEC) 10 MG tablet Take 10 mg by mouth daily as needed for allergies.     . cyanocobalamin (,VITAMIN B-12,) 1000 MCG/ML injection Inject 1.65ml every 3 weeks 10 mL 3  . metoprolol succinate (TOPROL-XL) 25 MG 24 hr tablet Take 1 tablet (25 mg total) by mouth daily. 90 tablet 3  . pravastatin (PRAVACHOL) 40 MG tablet Take 1 tablet (40 mg total) by mouth daily. 90 tablet 3  . telmisartan (MICARDIS) 40 MG tablet Take 1 tablet (40 mg total) by mouth daily. 90 tablet 3  . acetaminophen  (TYLENOL) 500 MG tablet Take 500 mg by mouth daily as needed (for pain). Reported on 02/19/2016    . fluticasone (FLONASE) 50 MCG/ACT nasal spray Place 1-2 sprays into both nostrils daily as needed for allergies. (Patient not taking: Reported on 02/19/2016) 16 g 5  . NITROSTAT 0.4 MG SL tablet Place 0.4 mg under the tongue every 5 (five) minutes as needed for chest pain. Reported on 02/19/2016     No current facility-administered medications for this visit.    Objective: BP 140/72 mmHg  Pulse 68  Temp(Src) 98 F (36.7 C)  Wt 206 lb (93.441 kg) Gen: NAD, resting comfortably Mucous membranes are moist. CV: RRR no murmurs rubs or gallops Lungs: CTAB no crackles, wheeze, rhonchi  Ext: no edema Skin: warm, dry Neuro: grossly normal, moves all extremities, normal gait- not orthostatic when going from sitting to standing  Assessment/Plan:  Essential hypertension S:  When his blood pressure gets to around 100 he feels lightheaded. This usually occurs most mornings and fade after he rests and drinks some fluids. He does not sleep well and thinks it contributes but none of sleep medicines have worked for him in making this issue better. This has been going on for at least 2 years and common reason for him to come in for visit.  BP Readings from Last 3 Encounters:  02/19/16 140/72  12/12/15 152/88  11/22/15 130/80  A/P: we will leave his medications the same but move telmisartan to evening to see if makes a difference. Do not think we can titrate down further as would further increase cardiac risk and SBP already slightly above goal.  I doubt it will honestly. We have follow up on 26th to check in. Consider a1c though doubt this is hyperglycemia causing this. He will check HR with blood pressure to make sure not bradycardic. Not lightheaded with standing on exam today and did not complete orthostatics. May get more accurate picture at follow up which is in Am-consider neuro exam and orthostatics at that  time.   Return precautions advised.   Meds ordered this encounter  Medications  . cyanocobalamin ((VITAMIN B-12)) injection 1,000 mcg    Sig:    The duration of face-to-face time during this visit was 15 minutes. Greater than 50% of this time was spent in counseling, explanation of diagnosis, planning of further management, and/or coordination of care.

## 2016-02-20 ENCOUNTER — Telehealth: Payer: Self-pay | Admitting: Family Medicine

## 2016-02-20 NOTE — Telephone Encounter (Signed)
Refill ok? 

## 2016-02-20 NOTE — Telephone Encounter (Signed)
Pt needs new rx zolpidem 5 mg#30 w/refills  call into [facility] [address]

## 2016-02-20 NOTE — Telephone Encounter (Signed)
Yes thanks 

## 2016-02-21 MED ORDER — ZOLPIDEM TARTRATE 5 MG PO TABS: 5.0000 mg | ORAL_TABLET | Freq: Every evening | ORAL | Status: DC | PRN

## 2016-02-21 NOTE — Telephone Encounter (Signed)
Medication called in 

## 2016-03-12 ENCOUNTER — Ambulatory Visit (INDEPENDENT_AMBULATORY_CARE_PROVIDER_SITE_OTHER): Payer: Medicare Other | Admitting: Family Medicine

## 2016-03-12 ENCOUNTER — Encounter: Payer: Self-pay | Admitting: Family Medicine

## 2016-03-12 VITALS — BP 130/74 | HR 80 | Temp 97.9°F | Wt 208.0 lb

## 2016-03-12 DIAGNOSIS — E538 Deficiency of other specified B group vitamins: Secondary | ICD-10-CM | POA: Diagnosis not present

## 2016-03-12 DIAGNOSIS — I2510 Atherosclerotic heart disease of native coronary artery without angina pectoris: Secondary | ICD-10-CM

## 2016-03-12 DIAGNOSIS — I10 Essential (primary) hypertension: Secondary | ICD-10-CM | POA: Diagnosis not present

## 2016-03-12 DIAGNOSIS — E785 Hyperlipidemia, unspecified: Secondary | ICD-10-CM

## 2016-03-12 LAB — BASIC METABOLIC PANEL
BUN: 14 mg/dL (ref 6–23)
CO2: 28 mEq/L (ref 19–32)
Calcium: 9.2 mg/dL (ref 8.4–10.5)
Chloride: 103 mEq/L (ref 96–112)
Creatinine, Ser: 0.89 mg/dL (ref 0.40–1.50)
GFR: 87.93 mL/min (ref >60.00)
Glucose, Bld: 96 mg/dL (ref 70–99)
Potassium: 5 mEq/L (ref 3.5–5.1)
Sodium: 138 mEq/L (ref 135–145)

## 2016-03-12 LAB — VITAMIN B12: Vitamin B-12: 265 pg/mL (ref 211–911)

## 2016-03-12 MED ORDER — CYANOCOBALAMIN 1000 MCG/ML IJ SOLN
1000.0000 ug | Freq: Once | INTRAMUSCULAR | Status: AC
  Administered 2016-03-12: 1000 ug via INTRAMUSCULAR

## 2016-03-12 NOTE — Assessment & Plan Note (Signed)
S: controlled on telmisartan 40mg  in evening, metoprolol 25mg  XL BP Readings from Last 3 Encounters:  03/12/16 130/74  02/19/16 140/72  12/12/15 152/88  A/P:Continue current meds:  He fortunately is not having morning dizziness with moving dose to the evening

## 2016-03-12 NOTE — Progress Notes (Signed)
Subjective:  Timothy Gallagher is a 80 y.o. year old very pleasant male patient who presents for/with See problem oriented charting ROS- No chest pain or shortness of breath. No double vision or blurry vision. Occasional headaches controlled by tylenol.   Past Medical History-  Patient Active Problem List   Diagnosis Date Noted  . CAD (coronary artery disease) s/p CABG x4 05/19/2014    Priority: High  . Insomnia 11/22/2014    Priority: Medium  . Anemia 07/20/2014    Priority: Medium  . BPH (benign prostatic hyperplasia) 12/05/2013    Priority: Medium  . Hyperlipidemia 03/13/2008    Priority: Medium  . B12 deficiency 06/07/2007    Priority: Medium  . Essential hypertension 04/09/2007    Priority: Medium  . Internal bleeding hemorrhoids 01/22/2015    Priority: Low  . Palpitations 02/11/2013    Priority: Low  . OSTEOARTHRITIS, WRIST, RIGHT 08/05/2010    Priority: Low  . ACNE ROSACEA 06/27/2009    Priority: Low  . ESOPHAGEAL STRICTURE 04/27/2009    Priority: Low  . ACTINIC KERATOSIS, HEAD 04/18/2009    Priority: Low  . NECK PAIN 09/14/2008    Priority: Low  . NEUROPATHY, IDIOPATHIC PERIPHERAL NEC 08/11/2007    Priority: Low  . Irritable bowel syndrome 08/11/2007    Priority: Low  . ALLERGIC RHINITIS 04/09/2007    Priority: Low    Medications- reviewed and updated Current Outpatient Prescriptions  Medication Sig Dispense Refill  . aspirin 81 MG EC tablet Take 1 tablet (81 mg total) by mouth daily.    . cetirizine (ZYRTEC) 10 MG tablet Take 10 mg by mouth daily as needed for allergies.     . cyanocobalamin (,VITAMIN B-12,) 1000 MCG/ML injection Inject 1.57ml every 3 weeks 10 mL 3  . metoprolol succinate (TOPROL-XL) 25 MG 24 hr tablet Take 1 tablet (25 mg total) by mouth daily. 90 tablet 3  . pravastatin (PRAVACHOL) 40 MG tablet Take 1 tablet (40 mg total) by mouth daily. 90 tablet 3  . telmisartan (MICARDIS) 40 MG tablet Take 1 tablet (40 mg total) by mouth daily. 90 tablet  3  . acetaminophen (TYLENOL) 500 MG tablet Take 500 mg by mouth daily as needed (for pain). Reported on 03/12/2016    . fluticasone (FLONASE) 50 MCG/ACT nasal spray Place 1-2 sprays into both nostrils daily as needed for allergies. (Patient not taking: Reported on 02/19/2016) 16 g 5  . NITROSTAT 0.4 MG SL tablet Place 0.4 mg under the tongue every 5 (five) minutes as needed for chest pain. Reported on 03/12/2016    . zolpidem (AMBIEN) 5 MG tablet Take 1 tablet (5 mg total) by mouth at bedtime as needed for sleep. (Patient not taking: Reported on 03/12/2016) 30 tablet 5   No current facility-administered medications for this visit.    Objective: BP 130/74 mmHg  Pulse 80  Temp(Src) 97.9 F (36.6 C)  Wt 208 lb (94.348 kg) Gen: NAD, resting comfortably CV: RRR no murmurs rubs or gallops Lungs: CTAB no crackles, wheeze, rhonchi Abdomen: soft/nontender/nondistended/normal bowel sounds. No rebound or guarding.  Ext: no edema Skin: warm, dry Neuro: grossly normal, moves all extremities  Assessment/Plan:  Essential hypertension S: controlled on telmisartan 40mg  in evening, metoprolol 25mg  XL BP Readings from Last 3 Encounters:  03/12/16 130/74  02/19/16 140/72  12/12/15 152/88  A/P:Continue current meds:  He fortunately is not having morning dizziness with moving dose to the evening  B12 deficiency Injections on monthly basis of b12. Check b12  today then given injection  Hyperlipidemia S: reasonable control on pravastatin 40mg  with LDL <100, under 70 more ideal but had myalgias on stronger statins. Only mild  Myalgias on current dose Lab Results  Component Value Date   CHOL 221* 08/29/2013   HDL 42.20 08/29/2013   LDLCALC 128* 09/28/2012   LDLDIRECT 97.0 08/20/2015   TRIG 158.0* 08/29/2013   CHOLHDL 5 08/29/2013   A/P: continue pravastatin 40mg   CAD (coronary artery disease) s/p CABG x4 S: compliant with aspirin and statin, asymptomatic and has cards follow up in august A/P:  continue current medication    Return in about 4 months (around 07/12/2016) for annual wellness visit. Return precautions advised.   Orders Placed This Encounter  Procedures  . Basic metabolic panel    [place]  . Vitamin B12   [name], MD

## 2016-03-12 NOTE — Assessment & Plan Note (Signed)
S: reasonable control on pravastatin 40mg  with LDL <100, under 70 more ideal but had myalgias on stronger statins. Only mild  Myalgias on current dose Lab Results  Component Value Date   CHOL 221* 08/29/2013   HDL 42.20 08/29/2013   LDLCALC 128* 09/28/2012   LDLDIRECT 97.0 08/20/2015   TRIG 158.0* 08/29/2013   CHOLHDL 5 08/29/2013   A/P: continue pravastatin 40mg 

## 2016-03-12 NOTE — Assessment & Plan Note (Signed)
S: compliant with aspirin and statin, asymptomatic and has cards follow up in august A/P: continue current medication

## 2016-03-12 NOTE — Assessment & Plan Note (Signed)
Injections on monthly basis of b12. Check b12 today then given injection

## 2016-03-12 NOTE — Patient Instructions (Signed)
No changes to medications today, so glad your dizziness is better  Stop by lab then come back to room 13 so Keba can give you your b12 injection and continue to do this on monthly basis

## 2016-03-12 NOTE — Addendum Note (Signed)
Addended by: Clyde Lundborg A on: 03/12/2016 11:11 AM   Modules accepted: Orders

## 2016-03-27 ENCOUNTER — Telehealth: Payer: Self-pay | Admitting: Interventional Cardiology

## 2016-03-27 NOTE — Telephone Encounter (Signed)
Spoke with pt. Pt sts that he has being having fatigue and dizziness for a few months. His pcp recommending he split his antihypertensive medication, taking  micardis in the morning and metoprolol in the evening.  Pt sts that he has had some improvement with the dizziness, but still has intermittent fatigue. Pt denies chest pain, sob, palpitations, swelling, syncope, nausea, vomiting. Pt has an appt with Dr.Smith for Aug 2017. Adv pt thinks that his fatigue could be related to his medications. Adv pt that his pcp writes the scripts for his medications. He should f/u with his pcp to see if medication adjustments are needed. Pt should f/u with cardiololgy as planned, pt is to call the office if he develops any cardiac symptoms

## 2016-03-27 NOTE — Telephone Encounter (Signed)
Follow Up   Pt called to follow up

## 2016-03-27 NOTE — Telephone Encounter (Signed)
Returned pt call. lmtcb if assistance is still needed 

## 2016-03-27 NOTE — Telephone Encounter (Signed)
Pt calling c/o Fatigue-some SOB with exertion and dizziness-off and on for a few months -[phone]

## 2016-04-07 ENCOUNTER — Ambulatory Visit (INDEPENDENT_AMBULATORY_CARE_PROVIDER_SITE_OTHER): Payer: Medicare Other | Admitting: Internal Medicine

## 2016-04-07 ENCOUNTER — Encounter: Payer: Self-pay | Admitting: Internal Medicine

## 2016-04-07 VITALS — BP 130/72 | HR 76 | Ht 73.0 in | Wt 202.0 lb

## 2016-04-07 DIAGNOSIS — K602 Anal fissure, unspecified: Secondary | ICD-10-CM | POA: Diagnosis not present

## 2016-04-07 HISTORY — DX: Anal fissure, unspecified: K60.2

## 2016-04-07 MED ORDER — AMBULATORY NON FORMULARY MEDICATION: Status: DC

## 2016-04-07 NOTE — Patient Instructions (Signed)
   We have sent the following medications to [facility] for you to pick up at your convenience: Diltiazem cream, use this a month after you feel better.   Take 1 tablespoon of Benefiber daily.   Follow up with Dr [name] as needed.    I appreciate the opportunity to care for you. Silvano Rusk, MD, [facility]

## 2016-04-07 NOTE — Progress Notes (Signed)
   Subjective:    Patient ID: Timothy Gallagher, male    DOB: 07/03/1936, 80 y.o.   MRN: [id number] Cc: rectal pain and bleeding HPI The patient is here for follow-up, he has had hemorrhoids banded in the past that helped his bleeding. Sometime in the past few months though he started having some rectal pain and bleeding, the pain occurs during and after defecation in the sort of a burning sharp pain. There is small amounts of rectal bleeding, much better than prior to hemorrhoidal banding. He denies any straining to stool or large stool etc. except for occasional here and there type symptoms. No significant diarrhea. He has used some Preparation H with partial benefit. Medications, allergies, past medical history, past surgical history, family history and social history are reviewed and updated in the EMR.   Review of Systems As above    Objective:   Physical Exam BP 130/72 mmHg  Pulse 76  Ht 6\' 1"  (1.854 m)  Wt 202 lb (91.627 kg)  BMI 26.66 kg/m2 No acute distress, well-developed well-nourished looking younger than stated age Rectal - mild erythema No mass, increased tone - mildly tender posterior canal  Anoscopy - small internal hemorrhoids (Gr 1) and a small posterior fissure    Assessment & Plan:   Encounter Diagnosis  Name Primary?  [name] Kitchen Anal fissure Yes   Start diltiazem 2% topical cream twice a day to 3 times a day. The pathophysiology and nature of anal fissures explained to the patient. Benefiber one to 2 tablespoons daily also. He will see me as needed. I explained that he should take his diltiazem for a month after feeling well. I also explained that he can take weeks to months for resolution of fissure symptoms.

## 2016-04-11 ENCOUNTER — Ambulatory Visit (INDEPENDENT_AMBULATORY_CARE_PROVIDER_SITE_OTHER): Payer: Medicare Other | Admitting: Family Medicine

## 2016-04-11 ENCOUNTER — Ambulatory Visit: Payer: Medicare Other | Admitting: Family Medicine

## 2016-04-11 ENCOUNTER — Encounter: Payer: Self-pay | Admitting: Family Medicine

## 2016-04-11 VITALS — BP 126/70 | HR 64 | Temp 97.9°F | Wt 204.0 lb

## 2016-04-11 DIAGNOSIS — I10 Essential (primary) hypertension: Secondary | ICD-10-CM | POA: Diagnosis not present

## 2016-04-11 DIAGNOSIS — E538 Deficiency of other specified B group vitamins: Secondary | ICD-10-CM | POA: Diagnosis not present

## 2016-04-11 MED ORDER — CYANOCOBALAMIN 1000 MCG/ML IJ SOLN
1000.0000 ug | Freq: Once | INTRAMUSCULAR | Status: AC
  Administered 2016-04-11: 1000 ug via INTRAMUSCULAR

## 2016-04-11 MED ORDER — METOPROLOL SUCCINATE ER 25 MG PO TB24: 12.5000 mg | ORAL_TABLET | Freq: Every day | ORAL | Status: DC

## 2016-04-11 NOTE — Patient Instructions (Signed)
Let's have you try metoprolol 12.5mg  to see if that helps with your feelings of fatigue and sluggishness.   Update me in 2-3 weeks. Make sure to keep an eye on blood pressure with goal <140/90.

## 2016-04-11 NOTE — Assessment & Plan Note (Signed)
S: controlled on telmisartan 40mg  in evening, metoprolol 25mg  XL in the morning. He is concerned that metoprolol may be causing some fatigue. He just complains of some mild fatigue throughout day with no unintentional weight loss or night sweats BP Readings from Last 3 Encounters:  04/11/16 126/70  04/07/16 130/72  03/12/16 130/74  A/P:Continue current meds:  But reduce to 12.5mg  metoprolol and monitor BP at home. He previously noted improvement in dizziness and states that is still true but it has been more apparent when on diltiazem for anal fissure. He has changed this to 2x a day instead of 3x a day and feels better.

## 2016-04-11 NOTE — Progress Notes (Signed)
Subjective:  Timothy Gallagher is a 80 y.o. year old very pleasant male patient who presents for/with See problem oriented charting ROS- No chest pain or shortness of breath. No headache or blurry vision.see any ROS included in HPI as well.   Past Medical History-  Patient Active Problem List   Diagnosis Date Noted  . CAD (coronary artery disease) s/p CABG x4 05/19/2014    Priority: High  . Insomnia 11/22/2014    Priority: Medium  . Anemia 07/20/2014    Priority: Medium  . BPH (benign prostatic hyperplasia) 12/05/2013    Priority: Medium  . Hyperlipidemia 03/13/2008    Priority: Medium  . B12 deficiency 06/07/2007    Priority: Medium  . Essential hypertension 04/09/2007    Priority: Medium  . Internal bleeding hemorrhoids 01/22/2015    Priority: Low  . Palpitations 02/11/2013    Priority: Low  . OSTEOARTHRITIS, WRIST, RIGHT 08/05/2010    Priority: Low  . ACNE ROSACEA 06/27/2009    Priority: Low  . ESOPHAGEAL STRICTURE 04/27/2009    Priority: Low  . ACTINIC KERATOSIS, HEAD 04/18/2009    Priority: Low  . NECK PAIN 09/14/2008    Priority: Low  . NEUROPATHY, IDIOPATHIC PERIPHERAL NEC 08/11/2007    Priority: Low  . Irritable bowel syndrome 08/11/2007    Priority: Low  . ALLERGIC RHINITIS 04/09/2007    Priority: Low  . Anal fissure 04/07/2016    Medications- reviewed and updated Current Outpatient Prescriptions  Medication Sig Dispense Refill  . aspirin 81 MG EC tablet Take 1 tablet (81 mg total) by mouth daily.    . cetirizine (ZYRTEC) 10 MG tablet Take 10 mg by mouth daily as needed for allergies.     . cyanocobalamin (,VITAMIN B-12,) 1000 MCG/ML injection Inject 1.5ml every 3 weeks 10 mL 3  . fluticasone (FLONASE) 50 MCG/ACT nasal spray Place 1-2 sprays into both nostrils daily as needed for allergies. 16 g 5  . metoprolol succinate (TOPROL-XL) 25 MG 24 hr tablet Take 0.5 tablets (12.5 mg total) by mouth daily. 46 tablet 3  . pravastatin (PRAVACHOL) 40 MG tablet Take 1  tablet (40 mg total) by mouth daily. 90 tablet 3  . telmisartan (MICARDIS) 40 MG tablet Take 1 tablet (40 mg total) by mouth daily. 90 tablet 3  . acetaminophen (TYLENOL) 500 MG tablet Take 500 mg by mouth daily as needed (for pain). Reported on 04/11/2016    . AMBULATORY NON FORMULARY MEDICATION Diltiazem cream 2% Apply a pea size amount to the rectum three times a day (Patient not taking: Reported on 04/11/2016) 30 g 2  . zolpidem (AMBIEN) 5 MG tablet Take 1 tablet (5 mg total) by mouth at bedtime as needed for sleep. (Patient not taking: Reported on 04/11/2016) 30 tablet 5   No current facility-administered medications for this visit.    Objective: BP 126/70 mmHg  Pulse 64  Temp(Src) 97.9 F (36.6 C)  Wt 204 lb (92.534 kg) Gen: NAD, resting comfortably in chair CV: RRR no murmurs rubs or gallops Lungs: CTAB no crackles, wheeze, rhonchi Abdomen: soft/nontender/nondistended/normal bowel sounds. No rebound or guarding.  Ext: no edema Skin: warm, dry Neuro: CN II-XII intact, sensation and reflexes normal throughout, 5/5 muscle strength in bilateral upper and lower extremities. Normal finger to nose. Normal rapid alternating movements. No pronator drift. Normal romberg. Normal gait.   Assessment/Plan:  Essential hypertension S: controlled on telmisartan 40mg  in evening, metoprolol 25mg  XL in the morning. He is concerned that metoprolol may be causing  some fatigue. He just complains of some mild fatigue throughout day with no unintentional weight loss or night sweats BP Readings from Last 3 Encounters:  04/11/16 126/70  04/07/16 130/72  03/12/16 130/74  A/P:Continue current meds:  But reduce to 12.5mg  metoprolol and monitor BP at home. He previously noted improvement in dizziness and states that is still true but it has been more apparent when on diltiazem for anal fissure. He has changed this to 2x a day instead of 3x a day and feels better.   Return precautions advised.   Meds ordered  this encounter  Medications  . cyanocobalamin ((VITAMIN B-12)) injection 1,000 mcg    Sig:   . metoprolol succinate (TOPROL-XL) 25 MG 24 hr tablet    Sig: Take 0.5 tablets (12.5 mg total) by mouth daily.    Dispense:  46 tablet    Refill:  3   The duration of face-to-face time during this visit was 15 minutes. Greater than 50% of this time was spent in counseling, explanation of diagnosis, planning of further management, and/or coordination of care.    [name], MD

## 2016-04-24 ENCOUNTER — Ambulatory Visit (INDEPENDENT_AMBULATORY_CARE_PROVIDER_SITE_OTHER): Payer: Medicare Other | Admitting: Family Medicine

## 2016-04-24 ENCOUNTER — Encounter: Payer: Self-pay | Admitting: Family Medicine

## 2016-04-24 VITALS — BP 138/82 | HR 64 | Temp 97.5°F | Ht 73.0 in | Wt 203.0 lb

## 2016-04-24 DIAGNOSIS — R42 Dizziness and giddiness: Secondary | ICD-10-CM

## 2016-04-24 DIAGNOSIS — I10 Essential (primary) hypertension: Secondary | ICD-10-CM

## 2016-04-24 DIAGNOSIS — R0602 Shortness of breath: Secondary | ICD-10-CM

## 2016-04-24 DIAGNOSIS — R5383 Other fatigue: Secondary | ICD-10-CM

## 2016-04-24 NOTE — Progress Notes (Signed)
Subjective:  Timothy Gallagher is a 80 y.o. year old very pleasant male patient who presents for/with See problem oriented charting ROS- no chest pain, diaphoresis, nausea. See any ROS included in HPI as well.   Past Medical History-  Patient Active Problem List   Diagnosis Date Noted  . CAD (coronary artery disease) s/p CABG x4 05/19/2014    Priority: High  . Insomnia 11/22/2014    Priority: Medium  . Anemia 07/20/2014    Priority: Medium  . BPH (benign prostatic hyperplasia) 12/05/2013    Priority: Medium  . Hyperlipidemia 03/13/2008    Priority: Medium  . B12 deficiency 06/07/2007    Priority: Medium  . Essential hypertension 04/09/2007    Priority: Medium  . Internal bleeding hemorrhoids 01/22/2015    Priority: Low  . Palpitations 02/11/2013    Priority: Low  . OSTEOARTHRITIS, WRIST, RIGHT 08/05/2010    Priority: Low  . ACNE ROSACEA 06/27/2009    Priority: Low  . ESOPHAGEAL STRICTURE 04/27/2009    Priority: Low  . ACTINIC KERATOSIS, HEAD 04/18/2009    Priority: Low  . NECK PAIN 09/14/2008    Priority: Low  . NEUROPATHY, IDIOPATHIC PERIPHERAL NEC 08/11/2007    Priority: Low  . Irritable bowel syndrome 08/11/2007    Priority: Low  . ALLERGIC RHINITIS 04/09/2007    Priority: Low  . Anal fissure 04/07/2016    Medications- reviewed and updated Current Outpatient Prescriptions  Medication Sig Dispense Refill  . acetaminophen (TYLENOL) 500 MG tablet Take 500 mg by mouth daily as needed (for pain). Reported on 04/11/2016    . AMBULATORY NON FORMULARY MEDICATION Diltiazem cream 2% Apply a pea size amount to the rectum three times a day 30 g 2  . aspirin 81 MG EC tablet Take 1 tablet (81 mg total) by mouth daily.    . cetirizine (ZYRTEC) 10 MG tablet Take 10 mg by mouth daily as needed for allergies.     . cyanocobalamin (,VITAMIN B-12,) 1000 MCG/ML injection Inject 1.2ml every 3 weeks 10 mL 3  . fluticasone (FLONASE) 50 MCG/ACT nasal spray Place 1-2 sprays into both  nostrils daily as needed for allergies. 16 g 5  . metoprolol succinate (TOPROL-XL) 25 MG 24 hr tablet Take 0.5 tablets (12.5 mg total) by mouth daily. 46 tablet 3  . pravastatin (PRAVACHOL) 40 MG tablet Take 1 tablet (40 mg total) by mouth daily. 90 tablet 3  . telmisartan (MICARDIS) 40 MG tablet Take 1 tablet (40 mg total) by mouth daily. 90 tablet 3  . zolpidem (AMBIEN) 5 MG tablet Take 1 tablet (5 mg total) by mouth at bedtime as needed for sleep. 30 tablet 5   No current facility-administered medications for this visit.    Objective: BP 138/82 mmHg  Pulse 64  Temp(Src) 97.5 F (36.4 C) (Oral)  Ht 6\' 1"  (1.854 m)  Wt 203 lb (92.08 kg)  BMI 26.79 kg/m2  SpO2 97% Gen: NAD, resting comfortably, well appearing CV: RRR no murmurs rubs or gallops Lungs: CTAB no crackles, wheeze, rhonchi Ext: no edema Skin: warm, dry Neuro: grossly normal, moves all extremities  Assessment/Plan:  Shortness of breath-newly reported Fatigue- improved with reduced dose of metoprolol to 12.5mg  XL Dizziness-chronic issue for 2 years S: BP controlled at 138/82 on telmisartan 40mg  in evening, metoprolol 12.5mg  XL. Concern fatigue on metoprolol higher dose and improved on lower dose.   Patient also complains again about dizziness. 2 years dizziness intermittently- somedays feels great and other days bothers him several  times. May not be present for weeks at a time.   Also complains of 3-4 months of issues. Once a day gets SOB. Usually when up and moving but sometimes really exerts himself and no issues such as weedeated whole yard and no issues. Other days may just get off couch and gets short of breath.  A/P: HTN- Continue current meds:  telmisartan 40mg , metoprolol 12.5mg  XL  Dizziness- I do not think BP meds are to blame for dizziness. Previously reported resolved with med changes but then continues to have recurrent issues at varying frequencies. We discussed options for workup including MRI to evaluate  for CVA or mass lesions, neurology referral and meclizine option- he ended up declining all 3 options stating issues are mild and intermittent (yet he continues to return with complaints of them so we are taking concerns seriously- discussed benefits/risks to pursuing these).   Finally for SOB- he was considering seeing cardiology sooner than august but will have to see PA or NP as his provider is not available. Patient seems to carry a lot of anxiety after his bypass- I think reassuring voice of cardiology would help so encouraged him to follow up- he is going to call today due to some scheduling conflicts at home does not want [place] to call to schedule. Given sometimes he can complete more intense activities and other times gets SOB just getting off couch- suspicion is low that this is cardiac but will let cards team weigh in. I think anxiety is heavily at play.   Strict Return precautions advised.   The duration of face-to-face time during this visit was 20 minutes. Greater than 50% of this time was spent in counseling, explanation of diagnosis, planning of further management, and/or coordination of care.   [name], MD

## 2016-04-24 NOTE — Patient Instructions (Signed)
Blood pressure looks great. Glad your fatigue is better.   For shortness of breath- call cardiology to get in with a PA or NP within next few weeks. Seek care immediately if worsens. GIven how it is not a persistent issue I do not think this is your heart but think seeing cardiology will make you feel better.   We discussed options for dizziness including medication named meclizine, MRI to rule out stroke, or neurology referral. You declined all of the above listed. I do not think this is stroke related given time course and exam.

## 2016-04-24 NOTE — Progress Notes (Signed)
Pre visit review using our clinic review tool, if applicable. No additional management support is needed unless otherwise documented below in the visit note. 

## 2016-05-09 ENCOUNTER — Ambulatory Visit (INDEPENDENT_AMBULATORY_CARE_PROVIDER_SITE_OTHER): Payer: Medicare Other | Admitting: Family Medicine

## 2016-05-09 DIAGNOSIS — E538 Deficiency of other specified B group vitamins: Secondary | ICD-10-CM | POA: Diagnosis not present

## 2016-05-09 MED ORDER — CYANOCOBALAMIN 1000 MCG/ML IJ SOLN
1000.0000 ug | Freq: Once | INTRAMUSCULAR | Status: AC
  Administered 2016-05-09: 1000 ug via INTRAMUSCULAR

## 2016-05-23 ENCOUNTER — Ambulatory Visit (INDEPENDENT_AMBULATORY_CARE_PROVIDER_SITE_OTHER): Payer: Medicare Other | Admitting: Family Medicine

## 2016-05-23 ENCOUNTER — Encounter: Payer: Self-pay | Admitting: Family Medicine

## 2016-05-23 VITALS — BP 132/72 | HR 72 | Temp 98.0°F | Ht 73.0 in | Wt 200.0 lb

## 2016-05-23 DIAGNOSIS — E785 Hyperlipidemia, unspecified: Secondary | ICD-10-CM

## 2016-05-23 DIAGNOSIS — E538 Deficiency of other specified B group vitamins: Secondary | ICD-10-CM

## 2016-05-23 DIAGNOSIS — I2510 Atherosclerotic heart disease of native coronary artery without angina pectoris: Secondary | ICD-10-CM | POA: Diagnosis not present

## 2016-05-23 DIAGNOSIS — I10 Essential (primary) hypertension: Secondary | ICD-10-CM

## 2016-05-23 LAB — COMPREHENSIVE METABOLIC PANEL
ALT: 14 U/L (ref 0–53)
AST: 22 U/L (ref 0–37)
Albumin: 4 g/dL (ref 3.5–5.2)
Alkaline Phosphatase: 71 U/L (ref 39–117)
BUN: 27 mg/dL — ABNORMAL HIGH (ref 6–23)
CO2: 29 mEq/L (ref 19–32)
Calcium: 9.5 mg/dL (ref 8.4–10.5)
Chloride: 106 mEq/L (ref 96–112)
Creatinine, Ser: 0.99 mg/dL (ref 0.40–1.50)
GFR: 77.73 mL/min (ref >60.00)
Glucose, Bld: 97 mg/dL (ref 70–99)
Potassium: 5 mEq/L (ref 3.5–5.1)
Sodium: 141 mEq/L (ref 135–145)
Total Bilirubin: 0.4 mg/dL (ref 0.2–1.2)
Total Protein: 6.8 g/dL (ref 6.0–8.3)

## 2016-05-23 LAB — TSH: TSH: 1.64 u[IU]/mL (ref 0.35–4.50)

## 2016-05-23 LAB — CBC
HCT: 36.9 % — ABNORMAL LOW (ref 39.0–52.0)
Hemoglobin: 12 g/dL — ABNORMAL LOW (ref 13.0–17.0)
MCHC: 32.6 g/dL (ref 30.0–36.0)
MCV: 79.2 fl (ref 78.0–100.0)
Platelets: 187 10*3/uL (ref 150.0–400.0)
RBC: 4.66 Mil/uL (ref 4.22–5.81)
RDW: 16.8 % — ABNORMAL HIGH (ref 11.5–15.5)
WBC: 5.1 10*3/uL (ref 4.0–10.5)

## 2016-05-23 LAB — LDL CHOLESTEROL, DIRECT: Direct LDL: 91 mg/dL

## 2016-05-23 LAB — VITAMIN B12: Vitamin B-12: 360 pg/mL (ref 211–911)

## 2016-05-23 NOTE — Assessment & Plan Note (Signed)
S: compliant with aspirin and statin. No chest pain. Does get some SOB but does not seem exertional in fact sometimes is very active and has no issues whatsoever and at other times inactive and will feel fatigue and winded feeling.  A/P:He admits to a large amount of anxiety since his CABG and I think this certainly contributes. Do not think symptoms represent ischemic disease- if worsens he will follow up with cardiology before august

## 2016-05-23 NOTE — Progress Notes (Signed)
Pre visit review using our clinic review tool, if applicable. No additional management support is needed unless otherwise documented below in the visit note. 

## 2016-05-23 NOTE — Patient Instructions (Signed)
Labs before you go  No changes  Glad your symptoms have improved today- see [place] for new or worsening symptoms- cardiology if worsening shortness of breath that persists

## 2016-05-23 NOTE — Progress Notes (Signed)
Subjective:  Timothy Gallagher is a 80 y.o. year old very pleasant male patient who presents for/with See problem oriented charting ROS- has SOB but not consistently and no associated wheeze. No chest pain. Occasional dizziness.see any ROS included in HPI as well.   Past Medical History-  Patient Active Problem List   Diagnosis Date Noted  . CAD (coronary artery disease) s/p CABG x4 05/19/2014    Priority: High  . Insomnia 11/22/2014    Priority: Medium  . Anemia 07/20/2014    Priority: Medium  . BPH (benign prostatic hyperplasia) 12/05/2013    Priority: Medium  . Hyperlipidemia 03/13/2008    Priority: Medium  . B12 deficiency 06/07/2007    Priority: Medium  . Essential hypertension 04/09/2007    Priority: Medium  . Internal bleeding hemorrhoids 01/22/2015    Priority: Low  . Palpitations 02/11/2013    Priority: Low  . OSTEOARTHRITIS, WRIST, RIGHT 08/05/2010    Priority: Low  . ACNE ROSACEA 06/27/2009    Priority: Low  . ESOPHAGEAL STRICTURE 04/27/2009    Priority: Low  . ACTINIC KERATOSIS, HEAD 04/18/2009    Priority: Low  . NECK PAIN 09/14/2008    Priority: Low  . NEUROPATHY, IDIOPATHIC PERIPHERAL NEC 08/11/2007    Priority: Low  . Irritable bowel syndrome 08/11/2007    Priority: Low  . ALLERGIC RHINITIS 04/09/2007    Priority: Low  . Anal fissure 04/07/2016    Medications- reviewed and updated Current Outpatient Prescriptions  Medication Sig Dispense Refill  . acetaminophen (TYLENOL) 500 MG tablet Take 500 mg by mouth daily as needed (for pain). Reported on 04/11/2016    . AMBULATORY NON FORMULARY MEDICATION Diltiazem cream 2% Apply a pea size amount to the rectum three times a day 30 g 2  . aspirin 81 MG EC tablet Take 1 tablet (81 mg total) by mouth daily.    . cetirizine (ZYRTEC) 10 MG tablet Take 10 mg by mouth daily as needed for allergies.     . cyanocobalamin (,VITAMIN B-12,) 1000 MCG/ML injection Inject 1.17ml every 3 weeks 10 mL 3  . fluticasone (FLONASE)  50 MCG/ACT nasal spray Place 1-2 sprays into both nostrils daily as needed for allergies. 16 g 5  . metoprolol succinate (TOPROL-XL) 25 MG 24 hr tablet Take 0.5 tablets (12.5 mg total) by mouth daily. 46 tablet 3  . pravastatin (PRAVACHOL) 40 MG tablet Take 1 tablet (40 mg total) by mouth daily. 90 tablet 3  . telmisartan (MICARDIS) 40 MG tablet Take 1 tablet (40 mg total) by mouth daily. 90 tablet 3  . zolpidem (AMBIEN) 5 MG tablet Take 1 tablet (5 mg total) by mouth at bedtime as needed for sleep. 30 tablet 5   No current facility-administered medications for this visit.    Objective: BP 132/72 mmHg  Pulse 72  Temp(Src) 98 F (36.7 C) (Oral)  Ht 6\' 1"  (1.854 m)  Wt 200 lb (90.719 kg)  BMI 26.39 kg/m2  SpO2 94% Gen: NAD, resting comfortably, well appearing CV: RRR no murmurs rubs or gallops Lungs: CTAB no crackles, wheeze, rhonchi Ext: no edema Skin: warm, dry, no rash  Assessment/Plan:  Essential hypertension S: well controlled on Metoprolol 12.5 mg XL half tablet, telmisartan 40mg  BP Readings from Last 3 Encounters:  05/23/16 132/72  04/24/16 138/82  04/11/16 126/70  A/P:Continue current meds:  I do not think timing of these medications or his fatigue or SOb is related to his BP meds which he inquires about. Continued unclear etiology  Hyperlipidemia S: reasonably controlled on pravastatin 40mg  with LDL <100 though ideally would be under 70. Does have some myalgias so do not think we can titrate. He is still having  Fatigue, sob 2-3 days in the morning then gone for several days. Occasional dizziness but does not coincide with previous. Called cardiology and was told that if he had worsening symptoms to seek care but to wait until August visit per his recounting- I had encouraged him to get follow up with NP or PA before that time though I had low suspicion cardiac.  Lab Results  Component Value Date   CHOL 221* 08/29/2013   HDL 42.20 08/29/2013   LDLCALC 128* 09/28/2012    LDLDIRECT 91.0 05/23/2016   TRIG 158.0* 08/29/2013   CHOLHDL 5 08/29/2013   A/P: I do not think his statin is causing 2-3 days of several AM hours of fatigue that then resolves and may be gone for several days. Myalgias are likely related- with heart history, have encouraged him to remain on statin though. Did bloodwork today and no obvious cause of symptoms found on LDL, cbc, cmet, tsh, b12- mild anemai but do not think it is causing his symptoms and stable through 2015.     CAD (coronary artery disease) s/p CABG x4 S: compliant with aspirin and statin. No chest pain. Does get some SOB but does not seem exertional in fact sometimes is very active and has no issues whatsoever and at other times inactive and will feel fatigue and winded feeling.  A/P:He admits to a large amount of anxiety since his CABG and I think this certainly contributes. Do not think symptoms represent ischemic disease- if worsens he will follow up with cardiology before august  notes he had symptoms this AM but by time of visit had completely resolved  Orders Placed This Encounter  Procedures  . LDL cholesterol, direct    [place]  . TSH    [place]  . Vitamin B12  . CBC    [place]  . Comprehensive metabolic panel    [place]   Return precautions advised.  [name], MD

## 2016-05-23 NOTE — Assessment & Plan Note (Signed)
S: reasonably controlled on pravastatin 40mg  with LDL <100 though ideally would be under 70. Does have some myalgias so do not think we can titrate. He is still having  Fatigue, sob 2-3 days in the morning then gone for several days. Occasional dizziness but does not coincide with previous. Called cardiology and was told that if he had worsening symptoms to seek care but to wait until August visit per his recounting- I had encouraged him to get follow up with NP or PA before that time though I had low suspicion cardiac.  Lab Results  Component Value Date   CHOL 221* 08/29/2013   HDL 42.20 08/29/2013   LDLCALC 128* 09/28/2012   LDLDIRECT 91.0 05/23/2016   TRIG 158.0* 08/29/2013   CHOLHDL 5 08/29/2013   A/P: I do not think his statin is causing 2-3 days of several AM hours of fatigue that then resolves and may be gone for several days. Myalgias are likely related- with heart history, have encouraged him to remain on statin though. Did bloodwork today and no obvious cause of symptoms found on LDL, cbc, cmet, tsh, b12- mild anemai but do not think it is causing his symptoms and stable through 2015.

## 2016-05-23 NOTE — Assessment & Plan Note (Signed)
S: well controlled on Metoprolol 12.5 mg XL half tablet, telmisartan 40mg  BP Readings from Last 3 Encounters:  05/23/16 132/72  04/24/16 138/82  04/11/16 126/70  A/P:Continue current meds:  I do not think timing of these medications or his fatigue or SOb is related to his BP meds which he inquires about. Continued unclear etiology

## 2016-05-27 ENCOUNTER — Other Ambulatory Visit: Payer: Self-pay

## 2016-06-02 ENCOUNTER — Telehealth: Payer: Self-pay | Admitting: Family Medicine

## 2016-06-02 NOTE — Telephone Encounter (Signed)
PA Request

## 2016-06-02 NOTE — Telephone Encounter (Addendum)
Pt needs PA for ambien. Pharm [facility]  will fax insurance info. The PA request form in folder

## 2016-06-03 NOTE — Telephone Encounter (Signed)
PA submitted via Covermymeds. 

## 2016-06-06 ENCOUNTER — Ambulatory Visit (INDEPENDENT_AMBULATORY_CARE_PROVIDER_SITE_OTHER): Payer: Medicare Other | Admitting: Family Medicine

## 2016-06-06 DIAGNOSIS — E538 Deficiency of other specified B group vitamins: Secondary | ICD-10-CM

## 2016-06-06 MED ORDER — CYANOCOBALAMIN 1000 MCG/ML IJ SOLN
1000.0000 ug | INTRAMUSCULAR | Status: DC
  Administered 2016-06-06: 1000 ug via INTRAMUSCULAR

## 2016-06-09 DIAGNOSIS — R351 Nocturia: Secondary | ICD-10-CM | POA: Diagnosis not present

## 2016-06-09 DIAGNOSIS — R1030 Lower abdominal pain, unspecified: Secondary | ICD-10-CM | POA: Diagnosis not present

## 2016-06-09 DIAGNOSIS — N200 Calculus of kidney: Secondary | ICD-10-CM | POA: Diagnosis not present

## 2016-06-10 ENCOUNTER — Encounter: Payer: Self-pay | Admitting: Interventional Cardiology

## 2016-06-10 DIAGNOSIS — N200 Calculus of kidney: Secondary | ICD-10-CM | POA: Diagnosis not present

## 2016-06-10 NOTE — Telephone Encounter (Signed)
PA approved.

## 2016-06-19 ENCOUNTER — Telehealth: Payer: Self-pay

## 2016-06-19 NOTE — Telephone Encounter (Signed)
Prior Authorization approval received for Zolpidem Tartrate 5mg  OR Tabs

## 2016-06-20 ENCOUNTER — Telehealth: Payer: Self-pay

## 2016-06-20 NOTE — Telephone Encounter (Signed)
Prior Authorization filed for Zolpidem 5mg  tablets  Key: [employer]

## 2016-06-30 ENCOUNTER — Encounter: Payer: Self-pay | Admitting: Interventional Cardiology

## 2016-06-30 ENCOUNTER — Encounter (INDEPENDENT_AMBULATORY_CARE_PROVIDER_SITE_OTHER): Payer: Self-pay

## 2016-06-30 ENCOUNTER — Encounter: Payer: Self-pay | Admitting: Family Medicine

## 2016-06-30 ENCOUNTER — Ambulatory Visit (INDEPENDENT_AMBULATORY_CARE_PROVIDER_SITE_OTHER): Payer: Medicare Other | Admitting: Interventional Cardiology

## 2016-06-30 ENCOUNTER — Ambulatory Visit (INDEPENDENT_AMBULATORY_CARE_PROVIDER_SITE_OTHER): Payer: Medicare Other | Admitting: Family Medicine

## 2016-06-30 VITALS — BP 138/76 | HR 60 | Temp 98.1°F | Wt 202.2 lb

## 2016-06-30 VITALS — BP 146/80 | HR 64 | Ht 73.0 in | Wt 201.8 lb

## 2016-06-30 DIAGNOSIS — R1031 Right lower quadrant pain: Secondary | ICD-10-CM

## 2016-06-30 DIAGNOSIS — I25810 Atherosclerosis of coronary artery bypass graft(s) without angina pectoris: Secondary | ICD-10-CM

## 2016-06-30 DIAGNOSIS — I10 Essential (primary) hypertension: Secondary | ICD-10-CM | POA: Diagnosis not present

## 2016-06-30 DIAGNOSIS — E538 Deficiency of other specified B group vitamins: Secondary | ICD-10-CM

## 2016-06-30 DIAGNOSIS — E785 Hyperlipidemia, unspecified: Secondary | ICD-10-CM

## 2016-06-30 MED ORDER — ATORVASTATIN CALCIUM 10 MG PO TABS: 10.0000 mg | ORAL_TABLET | Freq: Every day | ORAL | 11 refills | Status: DC

## 2016-06-30 MED ORDER — CYANOCOBALAMIN 1000 MCG/ML IJ SOLN
1000.0000 ug | Freq: Once | INTRAMUSCULAR | Status: AC
  Administered 2016-06-30: 1000 ug via INTRAMUSCULAR

## 2016-06-30 NOTE — Progress Notes (Signed)
Subjective:  Timothy Gallagher is a 80 y.o. year old very pleasant male patient who presents for/with See problem oriented charting ROS- no nausea, vomiting, diarrhea. Denies constipation.see any ROS included in HPI as well.   Past Medical History-  Patient Active Problem List   Diagnosis Date Noted  . CAD (coronary artery disease) of artery bypass graft 05/19/2014    Priority: High  . Insomnia 11/22/2014    Priority: Medium  . Anemia 07/20/2014    Priority: Medium  . BPH (benign prostatic hyperplasia) 12/05/2013    Priority: Medium  . Hyperlipidemia 03/13/2008    Priority: Medium  . B12 deficiency 06/07/2007    Priority: Medium  . Essential hypertension 04/09/2007    Priority: Medium  . Internal bleeding hemorrhoids 01/22/2015    Priority: Low  . Palpitations 02/11/2013    Priority: Low  . OSTEOARTHRITIS, WRIST, RIGHT 08/05/2010    Priority: Low  . ACNE ROSACEA 06/27/2009    Priority: Low  . ESOPHAGEAL STRICTURE 04/27/2009    Priority: Low  . ACTINIC KERATOSIS, HEAD 04/18/2009    Priority: Low  . NECK PAIN 09/14/2008    Priority: Low  . NEUROPATHY, IDIOPATHIC PERIPHERAL NEC 08/11/2007    Priority: Low  . Irritable bowel syndrome 08/11/2007    Priority: Low  . ALLERGIC RHINITIS 04/09/2007    Priority: Low  . Anal fissure 04/07/2016    Medications- reviewed and updated Current Outpatient Prescriptions  Medication Sig Dispense Refill  . acetaminophen (TYLENOL) 500 MG tablet Take 500 mg by mouth daily as needed (for pain). Reported on 04/11/2016    . aspirin 81 MG EC tablet Take 1 tablet (81 mg total) by mouth daily.    [name] Kitchen atorvastatin (LIPITOR) 10 MG tablet Take 1 tablet (10 mg total) by mouth daily. 30 tablet 11  . cetirizine (ZYRTEC) 10 MG tablet Take 10 mg by mouth daily as needed for allergies.     . fluticasone (FLONASE) 50 MCG/ACT nasal spray Place 1-2 sprays into both nostrils daily as needed for allergies. 16 g 5  . metoprolol succinate (TOPROL-XL) 25 MG 24 hr  tablet Take 0.5 tablets (12.5 mg total) by mouth daily. 46 tablet 3  . Multiple Vitamins-Minerals (CENTRUM SILVER PO) Take 1 tablet by mouth daily.    . pravastatin (PRAVACHOL) 40 MG tablet Take 1 tablet (40 mg total) by mouth daily. 90 tablet 3  . PRESCRIPTION MEDICATION VIT B-12 Take one (1) injection into the muscle every four (4) weeks.    [name] Kitchen telmisartan (MICARDIS) 40 MG tablet Take 1 tablet (40 mg total) by mouth daily. 90 tablet 3  . zolpidem (AMBIEN) 5 MG tablet Take 1 tablet (5 mg total) by mouth at bedtime as needed for sleep. 30 tablet 5  . tamsulosin (FLOMAX) 0.4 MG CAPS capsule Take 0.4 mg by mouth at bedtime.  6   No current facility-administered medications for this visit.     Objective: BP 138/76 (BP Location: Left Arm, Patient Position: Sitting, Cuff Size: Normal)   Pulse 60   Temp 98.1 F (36.7 C) (Oral)   Wt 202 lb 3.2 oz (91.7 kg)   SpO2 96%   BMI 26.68 kg/m  Gen: NAD, resting comfortably CV: RRR no murmurs rubs or gallops Lungs: CTAB no crackles, wheeze, rhonchi Abdomen: soft/mild tenderness RLQ- worse when pressed upon when patient tightens abdominal muscles or when he sits up/nondistended/normal bowel sounds. No rebound or guarding.  Ext: no edema Skin: warm, dry Neuro: grossly normal, moves all extremities  Assessment/Plan:  Right lower quadrant pain  S: Right lower abdominal pain starting about a month ago. Saw urology had ultrasound, CT scan, urinalysis and everything was normal except that he was told bladder did not empty fully. Was told not urological issues. Had been peeing a lot and that slowed down after starting flomax- had to stop this because he felt very weak so came off flomax.   Scan through PACS system viewed with noted sigmoid diverticulosis without evidence of diverticulitis. No right upper quadrant inflammatory stranding or extrahepatic biliary dilation. Nonobstructing nephrolithiasis. Infrarenal abdominal aortic ectasia 2.9 cm. I  interdependently reviewed films and noted some stool burden on bilateral sides  A/P: RLQ pain already with extensive workup by urology. I suspect this is an abdominal wall strain- he does state after I discussed this that warmth helps such as when he is in shower so we discussed using heating pad and taking it easy with this. If he has new or worsening symptoms he will return to care. Discussed next step in workup would be CT with contrast but given recent radiation plus prior CT in 03/2015 and mild symptoms at present would prefer to avoid  Meds ordered this encounter  Medications  . cyanocobalamin ((VITAMIN B-12)) injection 1,000 mcg    Return precautions advised.  [name], MD

## 2016-06-30 NOTE — Patient Instructions (Signed)
Urology did a great job working you up. From CT scan I do not see an internal cause for your symptoms. On exam, you are really tender when you flex your abdominal muscles- I think this is a strain of your abdominal wall. Try to take it easy- can do heat up to 3x a day

## 2016-06-30 NOTE — Patient Instructions (Signed)
Medication Instructions:  Your physician has recommended you make the following change in your medication:  START Atorvastatin 10mg  daily. An Rx has been sent to your pharmacy.   Labwork: Your physician recommends that you return for a FASTING lipid profile and lft on 08/11/16 lab hour are between 7:30am-5:00pm   Testing/Procedures: None ordered  Follow-Up: Your physician wants you to follow-up in: 1 year with Dr.Smith You will receive a reminder letter in the mail two months in advance. If you don't receive a letter, please call our office to schedule the follow-up appointment.   Any Other Special Instructions Will Be Listed Below (If Applicable). Your physician discussed the importance of regular exercise and recommended that you start or continue a regular exercise program for good health.  Your blood pressure goal is <130/90  Your LDL (bad cholesterol) goal is <70    If you need a refill on your cardiac medications before your next appointment, please call your pharmacy.

## 2016-06-30 NOTE — Progress Notes (Signed)
Pre visit review using our clinic review tool, if applicable. No additional management support is needed unless otherwise documented below in the visit note. 

## 2016-06-30 NOTE — Progress Notes (Signed)
Cardiology Office Note    Date:  06/30/2016   ID:  AUGUSTA PROPSON, DOB [birth year], MRN [id number]  PCP:  [name], MD  Cardiologist: Sinclair Grooms, MD   Chief Complaint  Patient presents with  . Coronary Artery Disease    History of Present Illness:  Timothy Gallagher is a 80 y.o. male who is seen for follow-up of coronary artery disease, history ofCABG (LIMA-LAD, SVG-PDA, SVG-OM, SVG-diagonal) by Dr. [name] 2015, hyperlipidemia, hypertension and history of kidney disease.  He is doing well. He has occasional dyspnea. He is not very active. He denies palpitations and syncope. Rarely he will feel somewhat lightheaded and dizzy. His primary physician is made medication adjustments. His last lipid LDL was 91  Past Medical History:  Diagnosis Date  . ACNE ROSACEA 06/27/2009  . ACTINIC KERATOSIS, HEAD 04/18/2009  . Acute maxillary sinusitis 05/14/2010  . ALLERGIC RHINITIS 04/09/2007  . Anal fissure 04/07/2016  . B12 DEFICIENCY 06/07/2007  . BACK PAIN WITH RADICULOPATHY 04/24/2008  . Cancer ([place])    skin  . CHEST WALL PAIN, ACUTE 06/15/2009  . Chronic maxillary sinusitis 05/29/2008  . COLITIS 04/27/2009  . COLONIC POLYPS, HX OF 04/27/2009   tubular adenomas  . Coronary artery disease 05/12/2014   Cath 05/12/2014 w/ severe 3-vessel CAD and preserved LV function, EF 55%  . DERMATITIS, ATOPIC 10/12/2007  . DIVERTICULOSIS, COLON 04/27/2009  . ECCHYMOSES, SPONTANEOUS 06/27/2008  . Elevated sedimentation rate 05/02/2009  . ESOPHAGEAL STRICTURE 04/27/2009  . GASTRITIS, CHRONIC 04/27/2009  . HYPERLIPIDEMIA 03/13/2008  . HYPERTENSION 04/09/2007  . Internal bleeding hemorrhoids 01/22/2015   01/22/2015 Seen at anoscopy, grade 1 all 3 positions   . Irritable bowel syndrome 08/11/2007  . KIDNEY DISEASE 04/09/2007  . NECK PAIN 09/14/2008  . NEUROPATHY, IDIOPATHIC PERIPHERAL NEC 08/11/2007  . OSTEOARTHRITIS, WRIST, RIGHT 08/05/2010  . Postoperative delirium 05/20/2014  . S/P CABG x 4 05/19/2014   LIMA to  LAD, SVG to diag, SVG to OM, SVG to PDA, EVH via right thigh and leg    Past Surgical History:  Procedure Laterality Date  . CARDIAC CATHETERIZATION    . COLONOSCOPY    . CORONARY ARTERY BYPASS GRAFT N/A 05/19/2014   Procedure: CORONARY ARTERY BYPASS GRAFTING (CABG);  Surgeon: Rexene Alberts, MD;  Location: [facility];  Service: Open Heart Surgery;  Laterality: N/A;  Times 4 using left internal mammary artery and endoscopically harvested right saphenous vein  . ESOPHAGOGASTRODUODENOSCOPY    . FINGER SURGERY     cut off end of finger  . FLEXIBLE SIGMOIDOSCOPY    . HEMORRHOID BANDING    . HERNIA REPAIR    . INCISION AND DRAINAGE WOUND WITH FOREIGN BODY REMOVAL Left 12/20/2013   Procedure: INCISION AND DRAINAGE LEFT INDEX FINGER;  Surgeon: Tennis Must, MD;  Location: [facility];  Service: Orthopedics;  Laterality: Left;  . INTRAOPERATIVE TRANSESOPHAGEAL ECHOCARDIOGRAM N/A 05/19/2014   Procedure: INTRAOPERATIVE TRANSESOPHAGEAL ECHOCARDIOGRAM;  Surgeon: Rexene Alberts, MD;  Location: [facility];  Service: Open Heart Surgery;  Laterality: N/A;  . lamenectomy    . LEFT HEART CATHETERIZATION WITH CORONARY ANGIOGRAM N/A 05/12/2014   Procedure: LEFT HEART CATHETERIZATION WITH CORONARY ANGIOGRAM;  Surgeon: Sinclair Grooms, MD;  Location: [facility];  Service: Cardiovascular;  Laterality: N/A;  . LUMBAR FUSION    . TONSILLECTOMY    . VARICOSE VEIN SURGERY Left     Current Medications: Outpatient Medications Prior to Visit  Medication Sig Dispense Refill  .  acetaminophen (TYLENOL) 500 MG tablet Take 500 mg by mouth daily as needed (for pain). Reported on 04/11/2016    . aspirin 81 MG EC tablet Take 1 tablet (81 mg total) by mouth daily.    . cetirizine (ZYRTEC) 10 MG tablet Take 10 mg by mouth daily as needed for allergies.     . fluticasone (FLONASE) 50 MCG/ACT nasal spray Place 1-2 sprays into both nostrils daily as needed for allergies. 16 g 5  . metoprolol succinate (TOPROL-XL) 25 MG 24 hr tablet Take 0.5  tablets (12.5 mg total) by mouth daily. 46 tablet 3  . pravastatin (PRAVACHOL) 40 MG tablet Take 1 tablet (40 mg total) by mouth daily. 90 tablet 3  . telmisartan (MICARDIS) 40 MG tablet Take 1 tablet (40 mg total) by mouth daily. 90 tablet 3  . zolpidem (AMBIEN) 5 MG tablet Take 1 tablet (5 mg total) by mouth at bedtime as needed for sleep. 30 tablet 5  . AMBULATORY NON FORMULARY MEDICATION Diltiazem cream 2% Apply a pea size amount to the rectum three times a day 30 g 2   No facility-administered medications prior to visit.      Allergies:   Lipitor [atorvastatin]; Trazodone and nefazodone; Ciprofloxacin; Mycophenolate mofetil; Amoxicillin; Penicillins; and Rosuvastatin   Social History   Social History  . Marital status: Married    Spouse name: N/A  . Number of children: 4  . Years of education: N/A   Occupational History  . retired Retired    from [employer]  . Smoking status: Former Smoker    Packs/day: 0.50    Years: 42.00    Types: Cigarettes    Quit date: 11/17/2001  . Smokeless tobacco: Never Used  . Alcohol use No  . Drug use: No  . Sexual activity: Yes   Other Topics Concern  . None   Social History Narrative   Married 35 years (2nd marriage). 2 kids from previous marriage. 2 stepchildren. 8 grandkids.       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   ROS:   Please see the history of present illness.    Dyspnea on exertion, back discomfort, and episodes of dizziness. Denies associated palpitations. Right upper quadrant discomfort All other systems reviewed and are negative.   PHYSICAL EXAM:   VS:  BP (!) 146/80   Pulse 64   Ht 6\' 1"  (1.854 m)   Wt 201 lb 12.8 oz (91.5 kg)   BMI 26.62 kg/m    GEN:  Well nourished, well developed, in no acute distress  HEENT: normal  Neck: no JVD, carotid bruits, or masses Cardiac: RRR; no murmurs, rubs, or gallops,no edema  Respiratory:  clear to auscultation bilaterally, normal work of breathing GI: soft, nontender, nondistended, + BS MS: no deformity or atrophy  Skin: warm and dry, no rash Neuro:  Alert and Oriented x 3, Strength and sensation are intact Psych: euthymic mood, full affect  Wt Readings from Last 3 Encounters:  06/30/16 201 lb 12.8 oz (91.5 kg)  05/23/16 200 lb (90.7 kg)  04/24/16 203 lb (92.1 kg)      Studies/Labs Reviewed:   EKG:  EKG  Reveals normal sinus rhythm with incomplete right bundle and leftward axis.  Recent Labs: 05/23/2016: ALT 14; BUN 27; Creatinine, Ser 0.99; Hemoglobin 12.0; Platelets 187.0; Potassium 5.0; Sodium 141; TSH 1.64   Lipid Panel    Component Value Date/Time   CHOL 221 (H) 08/29/2013 0759   TRIG 158.0 (H) 08/29/2013 0759   HDL 42.20 08/29/2013 0759   CHOLHDL 5 08/29/2013 0759   VLDL 31.6 08/29/2013 0759   LDLCALC 128 (H) 09/28/2012 1032   LDLDIRECT 91.0 05/23/2016 1346    Additional studies/ records that were reviewed today include:  Echocardiogram 05/19/2014: Transesophageal Study Conclusions  - Left ventricle: Systolic function was normal. The estimated ejection fraction was in the range of 55% to 60%. Wall motion was normal; there were no regional wall motion abnormalities. - Aortic valve: No evidence of vegetation. There was trivial regurgitation. - Mitral valve: No evidence of vegetation. There was mild regurgitation. - Left atrium: No evidence of thrombus in the appendage. - Atrial septum: No defect or patent foramen ovale was identified. - Tricuspid valve: No evidence of vegetation. - Pulmonic valve: No evidence of vegetation.   ASSESSMENT:    1. Coronary artery disease involving coronary bypass graft of native heart without angina pectoris   2. Essential  hypertension   3. Hyperlipidemia      PLAN:  In order of problems listed above:  1. We discussed blood pressure and LDL targets (130/90 mmHg less and less and 70 respectively). I encouraged aerobic activity. 2. Low-salt diet 3. Atorvastatin 10 mg daily with liver and lipid panel in 6 weeks.  I also recommended that he follow-up with his primary physician concerning right quadrant and right flank discomfort  Medication Adjustments/Labs and Tests Ordered: Current medicines are reviewed at length with the patient today.  Concerns regarding medicines are outlined above.  Medication changes, Labs and Tests ordered today are listed in the Patient Instructions below. There are no Patient Instructions on file for this visit.   Signed, Sinclair Grooms, MD  06/30/2016 8:20 AM    [facility] [address], [place], [place]  [address] Phone: [phone]; Fax: [phone]

## 2016-07-04 ENCOUNTER — Ambulatory Visit: Payer: Medicare Other | Admitting: Family Medicine

## 2016-07-28 ENCOUNTER — Ambulatory Visit (INDEPENDENT_AMBULATORY_CARE_PROVIDER_SITE_OTHER): Payer: Medicare Other | Admitting: Family Medicine

## 2016-07-28 DIAGNOSIS — Z23 Encounter for immunization: Secondary | ICD-10-CM | POA: Diagnosis not present

## 2016-07-28 DIAGNOSIS — E538 Deficiency of other specified B group vitamins: Secondary | ICD-10-CM | POA: Diagnosis not present

## 2016-07-28 DIAGNOSIS — R7989 Other specified abnormal findings of blood chemistry: Secondary | ICD-10-CM

## 2016-07-28 MED ORDER — CYANOCOBALAMIN 1000 MCG/ML IJ SOLN
1000.0000 ug | Freq: Once | INTRAMUSCULAR | Status: AC
  Administered 2016-07-28: 1000 ug via INTRAMUSCULAR

## 2016-08-06 ENCOUNTER — Telehealth: Payer: Self-pay | Admitting: Family Medicine

## 2016-08-06 NOTE — Telephone Encounter (Signed)
Patient Name: Timothy Gallagher DOB: February 16, 1936 Initial Comment got flu shot on 12th. since then has been having headache Nurse Assessment Nurse: Vallery Sa, RN, [place] Date/Time (Eastern Time): 08/06/2016 11:03:30 AM Confirm and document reason for call. If symptomatic, describe symptoms. You must click the next button to save text entered. ---Caller states he had a Flu shot 8 days ago and he has had headache on and off (rated as a 5 on the 1 to 10 scale. No severe breathing or swallowing difficulty. No redness at the injection area. No rashes. Alert and responsive. Has the patient traveled out of the country within the last 30 days? ---No Does the patient have any new or worsening symptoms? ---Yes Will a triage be completed? ---Yes Related visit to physician within the last 2 weeks? ---Yes Does the PT have any chronic conditions? (i.e. diabetes, asthma, etc.) ---Yes List chronic conditions. ---Heart surgery 2 years ago, High Blood Pressure Is this a behavioral health or substance abuse call? ---No Guidelines Guideline Title Affirmed Question Affirmed Notes Immunization Reactions Influenza (TIV; Injection) injected vaccine reactions (all triage questions negative) Headache [1] MODERATE headache (e.g., interferes with normal activities) AND [2] present > 24 hours AND [3] unexplained (Exceptions: analgesics not tried, typical migraine, or headache part of viral illness) Final Disposition User See Physician within [place], RN, [place] Comments Scheduled for 11:15am appointment 08/07/16 with [name]. Referrals REFERRED TO PCP OFFICE Disagree/Comply: Comply

## 2016-08-06 NOTE — Telephone Encounter (Signed)
Noted  

## 2016-08-07 ENCOUNTER — Ambulatory Visit: Payer: Self-pay | Admitting: Family Medicine

## 2016-08-11 ENCOUNTER — Other Ambulatory Visit: Payer: Medicare Other | Admitting: *Deleted

## 2016-08-11 DIAGNOSIS — E785 Hyperlipidemia, unspecified: Secondary | ICD-10-CM

## 2016-08-11 LAB — LIPID PANEL
Cholesterol: 133 mg/dL (ref 125–200)
HDL: 41 mg/dL (ref >=40)
LDL Cholesterol: 72 mg/dL (ref <130)
Total CHOL/HDL Ratio: 3.2 Ratio (ref <=5.0)
Triglycerides: 98 mg/dL (ref <150)
VLDL: 20 mg/dL (ref <30)

## 2016-08-11 LAB — HEPATIC FUNCTION PANEL
ALT: 18 U/L (ref 9–46)
AST: 22 U/L (ref 10–35)
Albumin: 3.8 g/dL (ref 3.6–5.1)
Alkaline Phosphatase: 66 U/L (ref 40–115)
Bilirubin, Direct: 0.1 mg/dL (ref <=0.2)
Indirect Bilirubin: 0.4 mg/dL (ref 0.2–1.2)
Total Bilirubin: 0.5 mg/dL (ref 0.2–1.2)
Total Protein: 6.6 g/dL (ref 6.1–8.1)

## 2016-08-12 ENCOUNTER — Other Ambulatory Visit: Payer: Self-pay

## 2016-08-12 DIAGNOSIS — E785 Hyperlipidemia, unspecified: Secondary | ICD-10-CM

## 2016-08-15 ENCOUNTER — Other Ambulatory Visit: Payer: Self-pay | Admitting: Family Medicine

## 2016-08-18 NOTE — Telephone Encounter (Signed)
Yes thanks 

## 2016-08-25 ENCOUNTER — Ambulatory Visit (INDEPENDENT_AMBULATORY_CARE_PROVIDER_SITE_OTHER): Payer: Medicare Other | Admitting: Family Medicine

## 2016-08-25 DIAGNOSIS — E538 Deficiency of other specified B group vitamins: Secondary | ICD-10-CM

## 2016-08-25 MED ORDER — CYANOCOBALAMIN 1000 MCG/ML IJ SOLN
1000.0000 ug | Freq: Once | INTRAMUSCULAR | Status: AC
  Administered 2016-08-25: 1000 ug via INTRAMUSCULAR

## 2016-09-01 ENCOUNTER — Ambulatory Visit (INDEPENDENT_AMBULATORY_CARE_PROVIDER_SITE_OTHER): Payer: Medicare Other | Admitting: Family Medicine

## 2016-09-01 ENCOUNTER — Encounter: Payer: Self-pay | Admitting: Family Medicine

## 2016-09-01 VITALS — BP 138/78 | HR 61 | Temp 97.6°F | Wt 201.4 lb

## 2016-09-01 DIAGNOSIS — H8110 Benign paroxysmal vertigo, unspecified ear: Secondary | ICD-10-CM | POA: Diagnosis not present

## 2016-09-01 MED ORDER — ZOLPIDEM TARTRATE 5 MG PO TABS: 5.0000 mg | ORAL_TABLET | Freq: Every evening | ORAL | 5 refills | Status: DC | PRN

## 2016-09-01 NOTE — Assessment & Plan Note (Signed)
S: Saturday evening 4 pm had episode of severe vertigo a minute or two after urinating. Nauseous all night. Still having some mild room spinning yesterday. States couldn't look down or he would get really vertiginous. No longer having issues. Got online and did some vestibular rehab through some videos and feeling better. Symptoms would last a few seconds after every head movement.  ROS- no hearing loss. Tinnitus stable at baseline. No facial or extremity weakness. No slurred words or trouble swallowing. no blurry vision or double vision. No paresthesias. No confusion or word finding difficulties.  A/P: Neuro exam reassuring. Complete resolution of symptoms. Only happened with head turning and improved with home vestibular exercises- very strong probability this is BPPV. Doubt TIA or CVA. He will get meclizine to have on hand in case recurrence and could do vestibular rehab in future if needed. This was very distinct from prior reported lightheadedness.

## 2016-09-01 NOTE — Progress Notes (Signed)
Subjective:  Timothy Gallagher is a 80 y.o. year old very pleasant male patient who presents for/with See problem oriented charting ROS- .see any ROS included in HPI as well.   Past Medical History-  Patient Active Problem List   Diagnosis Date Noted  . CAD (coronary artery disease) of artery bypass graft 05/19/2014    Priority: High  . Insomnia 11/22/2014    Priority: Medium  . Anemia 07/20/2014    Priority: Medium  . BPH (benign prostatic hyperplasia) 12/05/2013    Priority: Medium  . Hyperlipidemia 03/13/2008    Priority: Medium  . B12 deficiency 06/07/2007    Priority: Medium  . Essential hypertension 04/09/2007    Priority: Medium  . Internal bleeding hemorrhoids 01/22/2015    Priority: Low  . Palpitations 02/11/2013    Priority: Low  . OSTEOARTHRITIS, WRIST, RIGHT 08/05/2010    Priority: Low  . ACNE ROSACEA 06/27/2009    Priority: Low  . ESOPHAGEAL STRICTURE 04/27/2009    Priority: Low  . ACTINIC KERATOSIS, HEAD 04/18/2009    Priority: Low  . NECK PAIN 09/14/2008    Priority: Low  . NEUROPATHY, IDIOPATHIC PERIPHERAL NEC 08/11/2007    Priority: Low  . Irritable bowel syndrome 08/11/2007    Priority: Low  . ALLERGIC RHINITIS 04/09/2007    Priority: Low  . BPPV (benign paroxysmal positional vertigo) 09/01/2016  . Anal fissure 04/07/2016    Medications- reviewed and updated Current Outpatient Prescriptions  Medication Sig Dispense Refill  . acetaminophen (TYLENOL) 500 MG tablet Take 500 mg by mouth daily as needed (for pain). Reported on 04/11/2016    . aspirin 81 MG EC tablet Take 1 tablet (81 mg total) by mouth daily.    [name] Kitchen atorvastatin (LIPITOR) 10 MG tablet Take 1 tablet (10 mg total) by mouth daily. 30 tablet 11  . cetirizine (ZYRTEC) 10 MG tablet Take 10 mg by mouth daily as needed for allergies.     . fluticasone (FLONASE) 50 MCG/ACT nasal spray Place 1-2 sprays into both nostrils daily as needed for allergies. 16 g 5  . metoprolol succinate (TOPROL-XL) 25 MG  24 hr tablet Take 0.5 tablets (12.5 mg total) by mouth daily. 46 tablet 3  . Multiple Vitamins-Minerals (CENTRUM SILVER PO) Take 1 tablet by mouth daily.    . pravastatin (PRAVACHOL) 40 MG tablet Take 1 tablet (40 mg total) by mouth daily. 90 tablet 3  . PRESCRIPTION MEDICATION VIT B-12 Take one (1) injection into the muscle every four (4) weeks.    . tamsulosin (FLOMAX) 0.4 MG CAPS capsule Take 0.4 mg by mouth at bedtime.  6  . telmisartan (MICARDIS) 40 MG tablet Take 1 tablet (40 mg total) by mouth daily. 90 tablet 3  . zolpidem (AMBIEN) 5 MG tablet Take 1 tablet (5 mg total) by mouth at bedtime as needed. for sleep 30 tablet 5   No current facility-administered medications for this visit.     Objective: BP 138/78 (BP Location: Left Arm, Patient Position: Sitting, Cuff Size: Normal)   Pulse 61   Temp 97.6 F (36.4 C) (Oral)   Wt 201 lb 6.4 oz (91.4 kg)   SpO2 97%   BMI 26.57 kg/m  Gen: NAD, resting comfortably CV: RRR no murmurs rubs or gallops Lungs: CTAB no crackles, wheeze, rhonchi Abdomen: soft/nontender/nondistended/normal bowel sounds. No rebound or guarding.  Ext: no edema Skin: warm, dry Neuro: CN II-XII intact, sensation and reflexes normal throughout, 5/5 muscle strength in bilateral upper and lower extremities. Normal finger  to nose. Normal rapid alternating movements. No pronator drift. Normal romberg. Normal gait.    Assessment/Plan:   BPPV (benign paroxysmal positional vertigo) S: Saturday evening 4 pm had episode of severe vertigo a minute or two after urinating. Nauseous all night. Still having some mild room spinning yesterday. States couldn't look down or he would get really vertiginous. No longer having issues. Got online and did some vestibular rehab through some videos and feeling better. Symptoms would last a few seconds after every head movement.  ROS- no hearing loss. Tinnitus stable at baseline. No facial or extremity weakness. No slurred words or trouble  swallowing. no blurry vision or double vision. No paresthesias. No confusion or word finding difficulties.  A/P: Neuro exam reassuring. Complete resolution of symptoms. Only happened with head turning and improved with home vestibular exercises- very strong probability this is BPPV. Doubt TIA or CVA. He will get meclizine to have on hand in case recurrence and could do vestibular rehab in future if needed. This was very distinct from prior reported lightheadedness.   Refilled Lorrin Mais- has worked well with sleep. He thinks may need prior auth- will let us know Meds ordered this encounter  Medications  . zolpidem (AMBIEN) 5 MG tablet    Sig: Take 1 tablet (5 mg total) by mouth at bedtime as needed. for sleep    Dispense:  30 tablet    Refill:  5    Return precautions advised.  [name], MD

## 2016-09-01 NOTE — Progress Notes (Signed)
Pre visit review using our clinic review tool, if applicable. No additional management support is needed unless otherwise documented below in the visit note. 

## 2016-09-01 NOTE — Patient Instructions (Addendum)
You can buy meclizine over the counter to use in case this recurs again. I am glad you are doing so much better.   We can also send you to vestibular rehab if this recurs but glad the exercises at home helped.   Your exam was normal- no evidence of stroke.    Benign Positional Vertigo Vertigo is the feeling that you or your surroundings are moving when they are not. Benign positional vertigo is the most common form of vertigo. The cause of this condition is not serious (is benign). This condition is triggered by certain movements and positions (is positional). This condition can be dangerous if it occurs while you are doing something that could endanger you or others, such as driving.  CAUSES In many cases, the cause of this condition is not known. It may be caused by a disturbance in an area of the inner ear that helps your brain to sense movement and balance. This disturbance can be caused by a viral infection (labyrinthitis), head injury, or repetitive motion. RISK FACTORS This condition is more likely to develop in:  Women.  People who are 77 years of age or older. SYMPTOMS Symptoms of this condition usually happen when you move your head or your eyes in different directions. Symptoms may start suddenly, and they usually last for less than a minute. Symptoms may include:  Loss of balance and falling.  Feeling like you are spinning or moving.  Feeling like your surroundings are spinning or moving.  Nausea and vomiting.  Blurred vision.  Dizziness.  Involuntary eye movement (nystagmus). Symptoms can be mild and cause only slight annoyance, or they can be severe and interfere with daily life. Episodes of benign positional vertigo may return (recur) over time, and they may be triggered by certain movements. Symptoms may improve over time. DIAGNOSIS This condition is usually diagnosed by medical history and a physical exam of the head, neck, and ears. You may be referred to a health  care provider who specializes in ear, nose, and throat (ENT) problems (otolaryngologist) or a provider who specializes in disorders of the nervous system (neurologist). You may have additional testing, including:  MRI.  A CT scan.  Eye movement tests. Your health care provider may ask you to change positions quickly while he or she watches you for symptoms of benign positional vertigo, such as nystagmus. Eye movement may be tested with an electronystagmogram (ENG), caloric stimulation, the Dix-Hallpike test, or the roll test.  An electroencephalogram (EEG). This records electrical activity in your brain.  Hearing tests. TREATMENT Usually, your health care provider will treat this by moving your head in specific positions to adjust your inner ear back to normal. Surgery may be needed in severe cases, but this is rare. In some cases, benign positional vertigo may resolve on its own in 2-4 weeks. HOME CARE INSTRUCTIONS Safety  Move slowly.Avoid sudden body or head movements.  Avoid driving.  Avoid operating heavy machinery.  Avoid doing any tasks that would be dangerous to you or others if a vertigo episode would occur.  If you have trouble walking or keeping your balance, try using a cane for stability. If you feel dizzy or unstable, sit down right away.  Return to your normal activities as told by your health care provider. Ask your health care provider what activities are safe for you. General Instructions  Take over-the-counter and prescription medicines only as told by your health care provider.  Avoid certain positions or movements as told  by your health care provider.  Drink enough fluid to keep your urine clear or pale yellow.  Keep all follow-up visits as told by your health care provider. This is important. SEEK MEDICAL CARE IF:  You have a fever.  Your condition gets worse or you develop new symptoms.  Your family or friends notice any behavioral changes.  Your  nausea or vomiting gets worse.  You have numbness or a "pins and needles" sensation. SEEK IMMEDIATE MEDICAL CARE IF:  You have difficulty speaking or moving.  You are always dizzy.  You faint.  You develop severe headaches.  You have weakness in your legs or arms.  You have changes in your hearing or vision.  You develop a stiff neck.  You develop sensitivity to light.   This information is not intended to replace advice given to you by your health care provider. Make sure you discuss any questions you have with your health care provider.   Document Released: 08/11/2006 Document Revised: 07/25/2015 Document Reviewed: 02/26/2015 Elsevier Interactive Patient Education [employer].

## 2016-09-23 ENCOUNTER — Ambulatory Visit (INDEPENDENT_AMBULATORY_CARE_PROVIDER_SITE_OTHER): Payer: Medicare Other | Admitting: *Deleted

## 2016-09-23 DIAGNOSIS — E538 Deficiency of other specified B group vitamins: Secondary | ICD-10-CM

## 2016-09-23 MED ORDER — CYANOCOBALAMIN 1000 MCG/ML IJ SOLN
1000.0000 ug | Freq: Once | INTRAMUSCULAR | Status: AC
  Administered 2016-09-23: 1000 ug via INTRAMUSCULAR

## 2016-10-14 ENCOUNTER — Ambulatory Visit: Payer: Medicare Other | Admitting: Family Medicine

## 2016-10-16 ENCOUNTER — Other Ambulatory Visit: Payer: Self-pay

## 2016-10-16 MED ORDER — PRAVASTATIN SODIUM 40 MG PO TABS: 40.0000 mg | ORAL_TABLET | Freq: Every day | ORAL | 3 refills | Status: DC

## 2016-10-16 MED ORDER — TELMISARTAN 40 MG PO TABS
40.0000 mg | ORAL_TABLET | Freq: Every day | ORAL | 3 refills | Status: DC
Start: 2016-10-16 — End: 2016-10-16

## 2016-10-16 MED ORDER — TELMISARTAN 40 MG PO TABS: 40.0000 mg | ORAL_TABLET | Freq: Every day | ORAL | 3 refills | Status: DC

## 2016-10-23 ENCOUNTER — Ambulatory Visit (INDEPENDENT_AMBULATORY_CARE_PROVIDER_SITE_OTHER): Payer: Medicare Other | Admitting: Family Medicine

## 2016-10-23 DIAGNOSIS — E538 Deficiency of other specified B group vitamins: Secondary | ICD-10-CM | POA: Diagnosis not present

## 2016-10-23 MED ORDER — CYANOCOBALAMIN 1000 MCG/ML IJ SOLN
1000.0000 ug | Freq: Once | INTRAMUSCULAR | Status: AC
Start: 2016-10-23 — End: 2016-10-23
  Administered 2016-10-23: 1000 ug via INTRAMUSCULAR

## 2016-10-31 NOTE — Progress Notes (Signed)
Patient presented for B12 injection. Will continue on monthly basis.  Lab Results  Component Value Date   VITAMINB12 360 05/23/2016  consider b12 update next labs  [name]

## 2016-11-02 ENCOUNTER — Other Ambulatory Visit: Payer: Self-pay | Admitting: Family Medicine

## 2016-11-03 ENCOUNTER — Ambulatory Visit (INDEPENDENT_AMBULATORY_CARE_PROVIDER_SITE_OTHER): Payer: Medicare Other | Admitting: Family Medicine

## 2016-11-03 ENCOUNTER — Encounter: Payer: Self-pay | Admitting: Family Medicine

## 2016-11-03 VITALS — BP 122/66 | HR 66 | Temp 97.6°F | Ht 73.0 in | Wt 201.8 lb

## 2016-11-03 DIAGNOSIS — J3089 Other allergic rhinitis: Secondary | ICD-10-CM | POA: Diagnosis not present

## 2016-11-03 DIAGNOSIS — B9789 Other viral agents as the cause of diseases classified elsewhere: Secondary | ICD-10-CM

## 2016-11-03 DIAGNOSIS — J069 Acute upper respiratory infection, unspecified: Secondary | ICD-10-CM

## 2016-11-03 NOTE — Patient Instructions (Addendum)
Continue zyrtec and flonase- think there is an allergic element to this but think your primary issue is an upper respiratory infection (common cold)  Lots of rest and hydration. Would add mucinex as this can help get some of the discharge out/dried up  Update me on Thursday as to how you are doing     Do you use the computer at home?

## 2016-11-03 NOTE — Progress Notes (Signed)
PCP: [name], MD  Subjective:  Timothy Gallagher is a 80 y.o. year old very pleasant male patient who presents with Upper Respiratory infection symptoms including nasal congestion, mild sore throat, cough, sneezing -started: about 7 days ago. Symptoms are worsening in last 3 days after stability for several days though treatment is helping. Did get into a lot of dust in garage about a week ago -previous treatments: flonase dries up nasal congestion short term. Zyrtec has helped some.  -Hx of: allergies  ROS-denies fever, SOB, NVD, tooth pain  Pertinent Past Medical History-  Patient Active Problem List   Diagnosis Date Noted  . CAD (coronary artery disease) of artery bypass graft 05/19/2014    Priority: High  . BPPV (benign paroxysmal positional vertigo) 09/01/2016    Priority: Medium  . Insomnia 11/22/2014    Priority: Medium  . Anemia 07/20/2014    Priority: Medium  . BPH (benign prostatic hyperplasia) 12/05/2013    Priority: Medium  . Hyperlipidemia 03/13/2008    Priority: Medium  . B12 deficiency 06/07/2007    Priority: Medium  . Essential hypertension 04/09/2007    Priority: Medium  . Internal bleeding hemorrhoids 01/22/2015    Priority: Low  . Palpitations 02/11/2013    Priority: Low  . OSTEOARTHRITIS, WRIST, RIGHT 08/05/2010    Priority: Low  . ACNE ROSACEA 06/27/2009    Priority: Low  . ESOPHAGEAL STRICTURE 04/27/2009    Priority: Low  . ACTINIC KERATOSIS, HEAD 04/18/2009    Priority: Low  . NECK PAIN 09/14/2008    Priority: Low  . NEUROPATHY, IDIOPATHIC PERIPHERAL NEC 08/11/2007    Priority: Low  . Irritable bowel syndrome 08/11/2007    Priority: Low  . ALLERGIC RHINITIS 04/09/2007    Priority: Low  . Anal fissure 04/07/2016    Medications- reviewed  Current Outpatient Prescriptions  Medication Sig Dispense Refill  . acetaminophen (TYLENOL) 500 MG tablet Take 500 mg by mouth daily as needed (for pain). Reported on 04/11/2016    . aspirin 81 MG EC  tablet Take 1 tablet (81 mg total) by mouth daily.    . cetirizine (ZYRTEC) 10 MG tablet Take 10 mg by mouth daily as needed for allergies.     . fluticasone (FLONASE) 50 MCG/ACT nasal spray USE 1 TO 2 SPRAYS IN EACH NOSTRIL DAILY AS NEEDED FOR ALLERGIES 16 g 1  . metoprolol succinate (TOPROL-XL) 25 MG 24 hr tablet Take 0.5 tablets (12.5 mg total) by mouth daily. 46 tablet 3  . Multiple Vitamins-Minerals (CENTRUM SILVER PO) Take 1 tablet by mouth daily.    . pravastatin (PRAVACHOL) 40 MG tablet Take 1 tablet (40 mg total) by mouth daily. 90 tablet 3  . PRESCRIPTION MEDICATION VIT B-12 Take one (1) injection into the muscle every four (4) weeks.    . tamsulosin (FLOMAX) 0.4 MG CAPS capsule Take 0.4 mg by mouth at bedtime.  6  . telmisartan (MICARDIS) 40 MG tablet Take 1 tablet (40 mg total) by mouth daily. 90 tablet 3  . zolpidem (AMBIEN) 5 MG tablet Take 1 tablet (5 mg total) by mouth at bedtime as needed. for sleep 30 tablet 5  . atorvastatin (LIPITOR) 10 MG tablet Take 1 tablet (10 mg total) by mouth daily. 30 tablet 11   No current facility-administered medications for this visit.     Objective: BP 122/66 (BP Location: Left Arm, Patient Position: Sitting, Cuff Size: Large)   Pulse 66   Temp 97.6 F (36.4 C) (Oral)   Ht  6\' 1"  (1.854 m)   Wt 201 lb 12.8 oz (91.5 kg)   SpO2 96%   BMI 26.62 kg/m  Gen: NAD, resting comfortably HEENT: Turbinates erythematous with clear drainage, TM normal, pharynx mildly erythematous with no tonsilar exudate or edema, no sinus tenderness CV: RRR no murmurs rubs or gallops Lungs: CTAB no crackles, wheeze, rhonchi Ext: no edema Skin: warm, dry, no rash  Assessment/Plan:  Upper Respiratory infection With allergic rhinitis History and exam today are suggestive of viral infection most likely due to upper respiratory infection. Symptomatic treatment with: zyrtec and flonase considering potential allergic element. Also would use mucinex with nasal  drainage.   We discussed that we did not find any infection that had higher probability of being bacterial such as pneumonia or strep throat. We discussed signs that bacterial infection may have developed particularly fever or shortness of breath. Likely course of 1-2 weeks. Patient is contagious and advised good handwashing and consideration of mask If going to be in public places.   We did discuss potential sinusitis especially if persistent at 2 weeks, develops sinus pressure, or change in purulence of discharge.  Finally, we reviewed reasons to return to care including if symptoms worsen or persist or new concerns arise- once again particularly shortness of breath or fever.  [name], MD

## 2016-11-03 NOTE — Progress Notes (Signed)
Pre visit review using our clinic review tool, if applicable. No additional management support is needed unless otherwise documented below in the visit note. 

## 2016-11-06 ENCOUNTER — Telehealth: Payer: Self-pay | Admitting: Family Medicine

## 2016-11-06 NOTE — Telephone Encounter (Signed)
Pt state that he is feeling a little better but would like to have a antibiotic so that is will knock it out.  Pharm:  [facility]

## 2016-11-06 NOTE — Telephone Encounter (Signed)
Called and waited over a minute with no answer. If patient is continuing to improve- suggest he continue symptomatic treatment- likely a virus. Please let me know if his sinus congestion is not better or worsening or if having yellow or green nasal discharge or having fever.

## 2016-11-07 NOTE — Telephone Encounter (Signed)
Spoke to patient verbalized understanding that Dr Yong Channel tried to call him back yesterday with no success. Notified patient that he needs to continue with treatment and if not better of is having yellow or green nasal discharge to call the office per Dr Yong Channel. Patient stated that he is starting to feel better.

## 2016-11-24 ENCOUNTER — Ambulatory Visit (INDEPENDENT_AMBULATORY_CARE_PROVIDER_SITE_OTHER): Payer: Medicare Other

## 2016-11-24 DIAGNOSIS — E538 Deficiency of other specified B group vitamins: Secondary | ICD-10-CM | POA: Diagnosis not present

## 2016-11-24 MED ORDER — CYANOCOBALAMIN 1000 MCG/ML IJ SOLN
1000.0000 ug | Freq: Once | INTRAMUSCULAR | Status: AC
  Administered 2016-11-24: 1000 ug via INTRAMUSCULAR

## 2016-11-24 NOTE — Progress Notes (Signed)
Pt here for Vit B12 injection due to low Vit B12 level. Pt tolerated well.

## 2016-12-02 DIAGNOSIS — N320 Bladder-neck obstruction: Secondary | ICD-10-CM | POA: Diagnosis not present

## 2016-12-02 DIAGNOSIS — R351 Nocturia: Secondary | ICD-10-CM | POA: Diagnosis not present

## 2016-12-08 ENCOUNTER — Other Ambulatory Visit: Payer: Self-pay

## 2016-12-08 MED ORDER — METOPROLOL SUCCINATE ER 25 MG PO TB24: 12.5000 mg | ORAL_TABLET | Freq: Every day | ORAL | 3 refills | Status: DC

## 2016-12-17 ENCOUNTER — Ambulatory Visit: Payer: Medicare Other | Admitting: Internal Medicine

## 2016-12-18 ENCOUNTER — Telehealth: Payer: Self-pay | Admitting: Interventional Cardiology

## 2016-12-18 DIAGNOSIS — H5213 Myopia, bilateral: Secondary | ICD-10-CM | POA: Diagnosis not present

## 2016-12-18 DIAGNOSIS — E7849 Other hyperlipidemia: Secondary | ICD-10-CM

## 2016-12-18 MED ORDER — ATORVASTATIN CALCIUM 20 MG PO TABS: 20.0000 mg | ORAL_TABLET | Freq: Every day | ORAL | 3 refills | Status: DC

## 2016-12-18 NOTE — Telephone Encounter (Signed)
Spoke with pt and he states that he has been taking Pravastatin 40mg  QD and Atorvastatin 10mg  QD.  Pt has had no issues taking both other than some fatigue.  Pharmacist mentioned to pt that he shouldn't be on both and that could be causing his fatigue, therefore he has called today.  Pravastatin was prescribed by [name] and Atorvastatin by Dr. [name].  Advised pt I would send message to Dr. [name] for review and advisement on which medication he should be taking.  Pt states Dr. [name] normally follows his lipids.

## 2016-12-18 NOTE — Telephone Encounter (Signed)
New Message     pravastatin (PRAVACHOL) 40 MG tablet Take 1 tablet (40 mg total) by mouth daily.   atorvastatin (LIPITOR) 10 MG tablet(Expired) Take 1 tablet (10 mg total) by mouth daily.     Please call,  the pharmacist is concerned with him being on both of these medications at the same time, they told him it would make him tired?  Does he need both pills?

## 2016-12-18 NOTE — Telephone Encounter (Signed)
The patient should not be on both pravastatin and atorvastatin. I prefer he take atorvastatin 20 mg daily. Stop pravastatin. Liver and lipid panel in 6-8 weeks. He can take 2 atorvastatin 10 mg tablets daily until that supply is used up. Please authorize a new prescription for 20 mg atorvastatin to be taken one daily

## 2016-12-18 NOTE — Telephone Encounter (Signed)
Informed pt of recommendations per Dr. [name].  Pt verbalized understanding and was in agreement with this plan.  Pt will come for labs on 02/04/17.  Pt has allergy to Atorvastatin listed in allergy list.  Pt states that he has been taking Atorvastatin since August with no problems and doesn't remember the allergy from the past.  Advised pt that if the nausea or blurred vision occurs as listed with his allergy, to please call our office and let us know.  Pt agreeable.

## 2016-12-23 ENCOUNTER — Ambulatory Visit: Payer: Medicare Other

## 2016-12-26 ENCOUNTER — Telehealth: Payer: Self-pay | Admitting: Interventional Cardiology

## 2016-12-26 ENCOUNTER — Encounter: Payer: Self-pay | Admitting: Interventional Cardiology

## 2016-12-26 ENCOUNTER — Encounter: Payer: Self-pay | Admitting: *Deleted

## 2016-12-26 NOTE — Telephone Encounter (Signed)
All BP's listed below are from this morning.  Pt concerned about BP being on the lower side today.  Only issue has been some slight dizziness.  Checked BP right before I called and it was 117/68 and dizziness was improved.  Took Metoprolol Succ 12.5mg  this morning, take Telmisartan at 2pm.  Denies ever feeling like he was going to pass out.  States he stays active.  Advised pt to increase fluids to help bring BP up and continue to monitor.  Advised to monitor 2 hrs after medications are taken and if BP continues to run low to contact our office.  Will route to Dr. [name] for review and further advisement.  Pt agreeable to plan and appreciative for help.

## 2016-12-26 NOTE — Telephone Encounter (Signed)
New Message  Pt c/o BP issue: STAT if pt c/o blurred vision, one-sided weakness or slurred speech  1. What are your last 5 BP readings? 102/63, 106/57, 103/57, 99/62  2. Are you having any other symptoms (ex. Dizziness, headache, blurred vision, passed out)? Dizzy  3. What is your BP issue? Pt voiced it's been running low.  Pt voiced he's only taken one of the BP medications this morning.

## 2016-12-26 NOTE — Telephone Encounter (Signed)
This encounter was created in error - please disregard.

## 2016-12-26 NOTE — Telephone Encounter (Signed)
Agree with recommendations that were made. Continue to monitor blood pressure over the weekend. If this persists, some adjustment may be needed and medication regimen.

## 2016-12-26 NOTE — Telephone Encounter (Signed)
Spoke with pt and reminded him about monitoring BP.  Advised him to call me on Monday if BP still running low.  Pt appreciative for assistance.

## 2016-12-26 NOTE — Telephone Encounter (Signed)
Pt called back to let me know BP now 124/77.

## 2016-12-26 NOTE — Telephone Encounter (Signed)
New Message     Returning Jennifers call he was to call her before he takes his afternoon medication for bp

## 2016-12-29 ENCOUNTER — Telehealth: Payer: Self-pay | Admitting: Interventional Cardiology

## 2016-12-29 NOTE — Telephone Encounter (Signed)
F/U CALL  Pt c/o BP issue: STAT if pt c/o blurred vision, one-sided weakness or slurred speech  1. What are your last 5 BP readings? N/A  2. Are you having any other symptoms (ex. Dizziness, headache, blurred vision, passed out)? Patient did not report any symptoms  3. What is your BP issue? Low blood pressure  Patient states that he was advised to call back if he had issues with Bp. Thanks

## 2016-12-29 NOTE — Telephone Encounter (Signed)
BP this morning 122/76 @ 0730 and 106/66 @ 0930.  Took metoprolol around 0730.  BP last night was 133/74.  Had slight dizziness this morning but this has since resolved.  Pt states that he has been having sinus issues for about 3-5 days now.  Took Zyrtec this morning and 30 mins later dizziness was gone.  Advised pt that dizziness is likely related to sinus issues.  Advised pt to continue monitoring BP and call us and let us know if BP is consistently low.  Pt verbalized understanding and was in agreement with this plan. Will route to Dr. [name] to see if any further recommendations.

## 2016-12-29 NOTE — Telephone Encounter (Signed)
No further recommendations. I believe his blood pressure is okay.

## 2016-12-29 NOTE — Telephone Encounter (Signed)
Let pt know Dr. [name] felt BP's are ok.  Advised pt to monitor BP and call if it is consistently low.  Pt appreciative for assistance.

## 2017-01-28 ENCOUNTER — Other Ambulatory Visit: Payer: Self-pay | Admitting: Family Medicine

## 2017-02-04 ENCOUNTER — Telehealth: Payer: Self-pay

## 2017-02-04 ENCOUNTER — Other Ambulatory Visit: Payer: Medicare Other | Admitting: *Deleted

## 2017-02-04 DIAGNOSIS — E7849 Other hyperlipidemia: Secondary | ICD-10-CM

## 2017-02-04 DIAGNOSIS — E784 Other hyperlipidemia: Secondary | ICD-10-CM | POA: Diagnosis not present

## 2017-02-04 DIAGNOSIS — I10 Essential (primary) hypertension: Secondary | ICD-10-CM

## 2017-02-04 LAB — LIPID PANEL
Chol/HDL Ratio: 2.8 ratio units (ref 0.0–5.0)
Cholesterol, Total: 125 mg/dL (ref 100–199)
HDL: 44 mg/dL (ref >39)
LDL Calculated: 65 mg/dL (ref 0–99)
Triglycerides: 79 mg/dL (ref 0–149)
VLDL Cholesterol Cal: 16 mg/dL (ref 5–40)

## 2017-02-04 LAB — HEPATIC FUNCTION PANEL
ALT: 16 IU/L (ref 0–44)
AST: 23 IU/L (ref 0–40)
Albumin: 3.8 g/dL (ref 3.5–4.8)
Alkaline Phosphatase: 83 IU/L (ref 39–117)
Bilirubin Total: 0.4 mg/dL (ref 0.0–1.2)
Bilirubin, Direct: 0.15 mg/dL (ref 0.00–0.40)
Total Protein: 6.5 g/dL (ref 6.0–8.5)

## 2017-02-04 NOTE — Telephone Encounter (Signed)
[employer] would like a  Couple of questions answered about this PA. 1.  Verify the dx code, 2.  Has pt tried and failed any other anything else?

## 2017-02-04 NOTE — Telephone Encounter (Signed)
Called and spoke with insurance company. Answered questions below, awaiting approval or denial.

## 2017-02-04 NOTE — Telephone Encounter (Signed)
Received PA request from insurance for Telmisartan. Pa submitted & is pending. Key: [id number]

## 2017-02-05 NOTE — Telephone Encounter (Signed)
telmisartan is for essential hypertension

## 2017-02-05 NOTE — Telephone Encounter (Signed)
PA denied, according to insurance company Hyperlipidemia is not a FDA labeled or medically accepted use.

## 2017-02-06 ENCOUNTER — Encounter: Payer: Self-pay | Admitting: Family Medicine

## 2017-02-06 ENCOUNTER — Ambulatory Visit (INDEPENDENT_AMBULATORY_CARE_PROVIDER_SITE_OTHER): Payer: Medicare Other | Admitting: Family Medicine

## 2017-02-06 VITALS — BP 124/82 | HR 59 | Temp 97.9°F | Ht 73.0 in | Wt 206.8 lb

## 2017-02-06 DIAGNOSIS — I10 Essential (primary) hypertension: Secondary | ICD-10-CM

## 2017-02-06 DIAGNOSIS — E538 Deficiency of other specified B group vitamins: Secondary | ICD-10-CM

## 2017-02-06 DIAGNOSIS — G8929 Other chronic pain: Secondary | ICD-10-CM

## 2017-02-06 DIAGNOSIS — M545 Low back pain, unspecified: Secondary | ICD-10-CM | POA: Insufficient documentation

## 2017-02-06 MED ORDER — CYANOCOBALAMIN 1000 MCG/ML IJ SOLN
1000.0000 ug | Freq: Once | INTRAMUSCULAR | Status: AC
  Administered 2017-02-06: 1000 ug via INTRAMUSCULAR

## 2017-02-06 NOTE — Telephone Encounter (Signed)
Wyteria from [employer] is calling to let Timothy Gallagher know she received the appeal and they have 7 day turn around

## 2017-02-06 NOTE — Assessment & Plan Note (Addendum)
S: controlled on telmisartan 40mg . Was having some lightheadedness but started pushing fluids and feeling better.  BP Readings from Last 3 Encounters:  02/06/17 124/82  11/03/16 122/66  09/01/16 138/78  A/P:Continue current meds:  Having issues getting drug approved. Has 30 day supply. Sent back to [place] to work on this- appears it was submitted under hld instead of hypertension

## 2017-02-06 NOTE — Telephone Encounter (Signed)
Appeal filed.

## 2017-02-06 NOTE — Patient Instructions (Addendum)
Roselyn Reef will give you a b12 shot. Sign up for another shot in 1 month  We are trying to get your telmisartan approved. Will have to use alternate medicine if not approved.   Let me know if you decide you want physical therapy for the back.   ______________________________________________________________________  Starting October 1st 2018, I will be transferring to our new location: [facility] (corner of [address] from [employer]) [place] Phone: [phone]  I would love to have you remain my patient at this new location as long as it remains convenient for you. I am excited about the opportunity to have x-ray and sports medicine in the new building but will really miss the awesome staff and physicians at [facility]. Continue to schedule appointments at [facility] and we will automatically transfer them to the [place] location starting October 1st.

## 2017-02-06 NOTE — Progress Notes (Signed)
Pre visit review using our clinic review tool, if applicable. No additional management support is needed unless otherwise documented below in the visit note. 

## 2017-02-06 NOTE — Assessment & Plan Note (Signed)
Did not get injection for about 2 months as was trying to avoid the flu. Advised to restart q4 weeks. First today. Consider updating b12 next bloodwork

## 2017-02-06 NOTE — Progress Notes (Signed)
Subjective:  Timothy Gallagher is a 81 y.o. year old very pleasant male patient who presents for/with See problem oriented charting ROS- some dizziness but improved with hydration. No chest pain or shortness of breath   Past Medical History-  Patient Active Problem List   Diagnosis Date Noted  . CAD (coronary artery disease) of artery bypass graft 05/19/2014    Priority: High  . BPPV (benign paroxysmal positional vertigo) 09/01/2016    Priority: Medium  . Insomnia 11/22/2014    Priority: Medium  . Anemia 07/20/2014    Priority: Medium  . BPH (benign prostatic hyperplasia) 12/05/2013    Priority: Medium  . Hyperlipidemia 03/13/2008    Priority: Medium  . B12 deficiency 06/07/2007    Priority: Medium  . Essential hypertension 04/09/2007    Priority: Medium  . Low back pain 02/06/2017    Priority: Low  . Internal bleeding hemorrhoids 01/22/2015    Priority: Low  . Palpitations 02/11/2013    Priority: Low  . OSTEOARTHRITIS, WRIST, RIGHT 08/05/2010    Priority: Low  . ACNE ROSACEA 06/27/2009    Priority: Low  . ESOPHAGEAL STRICTURE 04/27/2009    Priority: Low  . ACTINIC KERATOSIS, HEAD 04/18/2009    Priority: Low  . NECK PAIN 09/14/2008    Priority: Low  . NEUROPATHY, IDIOPATHIC PERIPHERAL NEC 08/11/2007    Priority: Low  . Irritable bowel syndrome 08/11/2007    Priority: Low  . ALLERGIC RHINITIS 04/09/2007    Priority: Low  . Anal fissure 04/07/2016    Medications- reviewed and updated Current Outpatient Prescriptions  Medication Sig Dispense Refill  . acetaminophen (TYLENOL) 500 MG tablet Take 500 mg by mouth daily as needed (for pain). Reported on 04/11/2016    . aspirin 81 MG EC tablet Take 1 tablet (81 mg total) by mouth daily.    [name] Kitchen atorvastatin (LIPITOR) 20 MG tablet Take 1 tablet (20 mg total) by mouth daily. 90 tablet 3  . cetirizine (ZYRTEC) 10 MG tablet Take 10 mg by mouth daily as needed for allergies.     . fluticasone (FLONASE) 50 MCG/ACT nasal spray USE 1  TO 2 SPRAYS IN EACH NOSTRIL DAILY AS NEEDED FOR ALLERGIES 16 g 3  . metoprolol succinate (TOPROL-XL) 25 MG 24 hr tablet Take 0.5 tablets (12.5 mg total) by mouth daily. 46 tablet 3  . Multiple Vitamins-Minerals (CENTRUM SILVER PO) Take 1 tablet by mouth daily.    [name] Kitchen PRESCRIPTION MEDICATION VIT B-12 Take one (1) injection into the muscle every four (4) weeks.    . tamsulosin (FLOMAX) 0.4 MG CAPS capsule Take 0.4 mg by mouth at bedtime.  6  . telmisartan (MICARDIS) 40 MG tablet Take 1 tablet (40 mg total) by mouth daily. 90 tablet 3  . zolpidem (AMBIEN) 5 MG tablet Take 1 tablet (5 mg total) by mouth at bedtime as needed. for sleep 30 tablet 5   No current facility-administered medications for this visit.     Objective: BP 124/82 (BP Location: Left Arm, Patient Position: Sitting, Cuff Size: Large)   Pulse (!) 59   Temp 97.9 F (36.6 C) (Oral)   Ht 6\' 1"  (1.854 m)   Wt 206 lb 12.8 oz (93.8 kg)   SpO2 98%   BMI 27.28 kg/m  Gen: NAD, resting comfortably CV: RRR no murmurs rubs or gallops Lungs: CTAB no crackles, wheeze, rhonchi Ext: no edema Skin: warm, dry Neuro: grossly normal, moves all extremities  Assessment/Plan:  Essential hypertension S: controlled on telmisartan 40mg . Was  having some lightheadedness but started pushing fluids and feeling better.  BP Readings from Last 3 Encounters:  02/06/17 124/82  11/03/16 122/66  09/01/16 138/78  A/P:Continue current meds:  Having issues getting drug approved. Has 30 day supply. Sent back to [place] to work on this- appears it was submitted under hld instead of hypertension  Low back pain S: long term issues with low back. Asks about a stronger brace- has some at home A/P: discussed instead of bracing would advise support by core strengthening and PT referral- he declines   B12 deficiency Did not get injection for about 2 months as was trying to avoid the flu. Advised to restart q4 weeks. First today. Consider updating b12 next  bloodwork  Meds ordered this encounter  Medications  . cyanocobalamin ((VITAMIN B-12)) injection 1,000 mcg    Return precautions advised.  [name], MD

## 2017-02-06 NOTE — Assessment & Plan Note (Signed)
S: long term issues with low back. Asks about a stronger brace- has some at home A/P: discussed instead of bracing would advise support by core strengthening and PT referral- he declines

## 2017-02-09 ENCOUNTER — Telehealth: Payer: Self-pay | Admitting: Family Medicine

## 2017-02-09 NOTE — Telephone Encounter (Signed)
Pt following up on this PA request.

## 2017-02-09 NOTE — Telephone Encounter (Signed)
Called and left message with wife letting them know insurance called on Friday and said it would be about 7 days before we had a response.

## 2017-02-09 NOTE — Telephone Encounter (Signed)
[employer] is calling for a medication appeal for the following medication telmisartan and would like to know if the pt has tried any of the following losartan,valsartan and irbesartan it not need medical support to tell why the pt has not tried these medication.  You can fax information to the following # [phone] (F)

## 2017-02-09 NOTE — Telephone Encounter (Signed)
Information faxed over to [facility].

## 2017-02-12 ENCOUNTER — Ambulatory Visit: Payer: Medicare Other | Admitting: Family Medicine

## 2017-02-12 MED ORDER — VALSARTAN 40 MG PO TABS: 40.0000 mg | ORAL_TABLET | Freq: Every day | ORAL | 3 refills | Status: DC

## 2017-02-12 NOTE — Telephone Encounter (Signed)
Stop telmisartan when runs out. Start valsartan 40mg  in its place. Please inform patient.

## 2017-02-12 NOTE — Assessment & Plan Note (Signed)
Stop telmisartan when runs out. Start valsartan 40mg  in its place. Nursing staff to call Continue metoprolol

## 2017-02-12 NOTE — Addendum Note (Signed)
Addended by: [name] on: 02/12/2017 05:31 PM   Modules accepted: Orders

## 2017-02-12 NOTE — Telephone Encounter (Signed)
Received the appeal paperwork. The insurance company is still denying the request even with a dx of Essential Hypertension. The other alternatives are Losartan, valsartan, and irbesartan.

## 2017-02-18 NOTE — Telephone Encounter (Signed)
Spoke with patient who verbalized understanding.

## 2017-03-09 ENCOUNTER — Encounter: Payer: Self-pay | Admitting: Family Medicine

## 2017-03-09 ENCOUNTER — Other Ambulatory Visit: Payer: Medicare Other

## 2017-03-09 ENCOUNTER — Ambulatory Visit (INDEPENDENT_AMBULATORY_CARE_PROVIDER_SITE_OTHER): Payer: Medicare Other | Admitting: Family Medicine

## 2017-03-09 VITALS — BP 134/86 | HR 68 | Temp 97.7°F | Ht 73.0 in | Wt 203.6 lb

## 2017-03-09 DIAGNOSIS — E538 Deficiency of other specified B group vitamins: Secondary | ICD-10-CM

## 2017-03-09 DIAGNOSIS — I10 Essential (primary) hypertension: Secondary | ICD-10-CM | POA: Diagnosis not present

## 2017-03-09 MED ORDER — CYANOCOBALAMIN 1000 MCG/ML IJ SOLN
1000.0000 ug | Freq: Once | INTRAMUSCULAR | Status: AC
  Administered 2017-03-09: 1000 ug via INTRAMUSCULAR

## 2017-03-09 NOTE — Progress Notes (Signed)
Pre visit review using our clinic review tool, if applicable. No additional management support is needed unless otherwise documented below in the visit note. 

## 2017-03-09 NOTE — Progress Notes (Signed)
Subjective:  Timothy Gallagher is a 81 y.o. year old very pleasant male patient who presents for/with See problem oriented charting ROS- No chest pain or shortness of breath. No headache or blurry vision. Rare lightheadedness usually better if pushes fluids   Past Medical History-  Patient Active Problem List   Diagnosis Date Noted  . CAD (coronary artery disease) of artery bypass graft 05/19/2014    Priority: High  . BPPV (benign paroxysmal positional vertigo) 09/01/2016    Priority: Medium  . Insomnia 11/22/2014    Priority: Medium  . Anemia 07/20/2014    Priority: Medium  . BPH (benign prostatic hyperplasia) 12/05/2013    Priority: Medium  . Hyperlipidemia 03/13/2008    Priority: Medium  . B12 deficiency 06/07/2007    Priority: Medium  . Essential hypertension 04/09/2007    Priority: Medium  . Low back pain 02/06/2017    Priority: Low  . Internal bleeding hemorrhoids 01/22/2015    Priority: Low  . Palpitations 02/11/2013    Priority: Low  . OSTEOARTHRITIS, WRIST, RIGHT 08/05/2010    Priority: Low  . ACNE ROSACEA 06/27/2009    Priority: Low  . ESOPHAGEAL STRICTURE 04/27/2009    Priority: Low  . ACTINIC KERATOSIS, HEAD 04/18/2009    Priority: Low  . NECK PAIN 09/14/2008    Priority: Low  . NEUROPATHY, IDIOPATHIC PERIPHERAL NEC 08/11/2007    Priority: Low  . Irritable bowel syndrome 08/11/2007    Priority: Low  . ALLERGIC RHINITIS 04/09/2007    Priority: Low  . Anal fissure 04/07/2016    Medications- reviewed and updated Current Outpatient Prescriptions  Medication Sig Dispense Refill  . acetaminophen (TYLENOL) 500 MG tablet Take 500 mg by mouth daily as needed (for pain). Reported on 04/11/2016    . aspirin 81 MG EC tablet Take 1 tablet (81 mg total) by mouth daily.    [name] Kitchen atorvastatin (LIPITOR) 20 MG tablet Take 1 tablet (20 mg total) by mouth daily. 90 tablet 3  . cetirizine (ZYRTEC) 10 MG tablet Take 10 mg by mouth daily as needed for allergies.     . fluticasone  (FLONASE) 50 MCG/ACT nasal spray USE 1 TO 2 SPRAYS IN EACH NOSTRIL DAILY AS NEEDED FOR ALLERGIES 16 g 3  . metoprolol succinate (TOPROL-XL) 25 MG 24 hr tablet Take 0.5 tablets (12.5 mg total) by mouth daily. 46 tablet 3  . Multiple Vitamins-Minerals (CENTRUM SILVER PO) Take 1 tablet by mouth daily.    [name] Kitchen PRESCRIPTION MEDICATION VIT B-12 Take one (1) injection into the muscle every four (4) weeks.    . tamsulosin (FLOMAX) 0.4 MG CAPS capsule Take 0.4 mg by mouth at bedtime.  6  . valsartan (DIOVAN) 40 MG tablet Take 1 tablet (40 mg total) by mouth daily. 90 tablet 3  . zolpidem (AMBIEN) 5 MG tablet Take 1 tablet (5 mg total) by mouth at bedtime as needed. for sleep 30 tablet 5   No current facility-administered medications for this visit.     Objective: BP 134/86   Pulse 68   Temp 97.7 F (36.5 C) (Oral)   Ht 6\' 1"  (1.854 m)   Wt 203 lb 9.6 oz (92.4 kg)   SpO2 95%   BMI 26.86 kg/m  Gen: NAD, resting comfortably CV: RRR no murmurs rubs or gallops Lungs: CTAB no crackles, wheeze, rhonchi Ext: no edema Skin: warm, dry Neuro: normal gait   Assessment/Plan:  Essential hypertension S: controlled on repeat on metoprolol 12.5mg  XR and telmisartan still- we had  planned this visit thinking he would have transitioned to  valsartan 40mg  but he states his first valsartan will be this afternoon. He had been on telmisartan  40mg  as of last visit but he was denied a prior authorization. Has had intermittent issues on telmisartan 40mg  usually resolving when he pushes fluids. Also on flomax which could contribute BP Readings from Last 3 Encounters:  03/09/17 (!) 158/80  02/06/17 124/82  11/03/16 122/66  A/P:Continue current meds:  Recheck BP with Manuela Schwartz 1 month for awv then see me in 3 months  B12 deficiency S:  patient had been 2 months off of b12 shot but received a month ago.  A/P: will get another b12 shot today and we will update next labs- he declines today. We will see if Manuela Schwartz can give  next b12 in 1 month at   1 month AWV and b12 shot. See me 3-4 months (sooner if BP up)  Return precautions advised.  [name], MD

## 2017-03-09 NOTE — Assessment & Plan Note (Signed)
S:  patient had been 2 months off of b12 shot but received a month ago.  A/P: will get another b12 shot today and we will update next labs- he declines today. We will see if Manuela Schwartz can give next b12 in 1 month at

## 2017-03-09 NOTE — Patient Instructions (Addendum)
I would also like for you to sign up for an annual wellness visit with our nurse, Manuela Schwartz, who specializes in the annual wellness exam. This is a free benefit under medicare that may help [place] find additional ways to help you. Some highlights are reviewing medications, lifestyle, and doing a dementia screen.   I will ask Manuela Schwartz if she can do your b12 shot at that time.   Sign up for this visit in 1 month

## 2017-03-09 NOTE — Assessment & Plan Note (Signed)
S: controlled on repeat on metoprolol 12.5mg  XR and telmisartan still- we had planned this visit thinking he would have transitioned to  valsartan 40mg  but he states his first valsartan will be this afternoon. He had been on telmisartan  40mg  as of last visit but he was denied a prior authorization. Has had intermittent issues on telmisartan 40mg  usually resolving when he pushes fluids. Also on flomax which could contribute BP Readings from Last 3 Encounters:  03/09/17 (!) 158/80  02/06/17 124/82  11/03/16 122/66  A/P:Continue current meds:  Recheck BP with Manuela Schwartz 1 month for awv then see me in 3 months

## 2017-03-09 NOTE — Addendum Note (Signed)
Addended by: Mariam Dollar, Roselyn Reef M on: 03/09/2017 01:20 PM   Modules accepted: Orders

## 2017-03-20 ENCOUNTER — Other Ambulatory Visit: Payer: Self-pay

## 2017-03-20 ENCOUNTER — Telehealth: Payer: Self-pay | Admitting: Family Medicine

## 2017-03-20 DIAGNOSIS — M79673 Pain in unspecified foot: Secondary | ICD-10-CM

## 2017-03-20 NOTE — Telephone Encounter (Signed)
Yes thanks, may refer under foot pain

## 2017-03-20 NOTE — Telephone Encounter (Signed)
Pt was seen on 03-09-17 and would like a referral to podiatry  By [facility] on [address]. Pt is still having foot pain

## 2017-03-20 NOTE — Telephone Encounter (Signed)
Referral to Podiatry placed.  

## 2017-03-23 ENCOUNTER — Telehealth: Payer: Self-pay | Admitting: Family Medicine

## 2017-03-23 NOTE — Telephone Encounter (Signed)
Is he on the valsartan 40mg  now? Possible for him to cut that in half to take?

## 2017-03-23 NOTE — Telephone Encounter (Signed)
Pt states his bp has now been running low about a week. Readings have been  103/68;  134/81; 94/56; 111/66; 106/70  Pt would like a call back to advise.

## 2017-03-25 ENCOUNTER — Other Ambulatory Visit: Payer: Self-pay | Admitting: Family Medicine

## 2017-03-25 NOTE — Telephone Encounter (Signed)
Spoke with patient who is going to try cutting dose in half. He will call back on Monday with BP readings

## 2017-04-02 ENCOUNTER — Ambulatory Visit: Payer: Medicare Other | Admitting: Podiatry

## 2017-04-07 ENCOUNTER — Ambulatory Visit (INDEPENDENT_AMBULATORY_CARE_PROVIDER_SITE_OTHER): Payer: Medicare Other

## 2017-04-07 ENCOUNTER — Ambulatory Visit: Payer: Medicare Other

## 2017-04-07 VITALS — BP 118/64 | HR 65 | Ht 73.0 in | Wt 201.0 lb

## 2017-04-07 DIAGNOSIS — Z0000 Encounter for general adult medical examination without abnormal findings: Secondary | ICD-10-CM

## 2017-04-07 DIAGNOSIS — E538 Deficiency of other specified B group vitamins: Secondary | ICD-10-CM | POA: Diagnosis not present

## 2017-04-07 MED ORDER — CYANOCOBALAMIN 1000 MCG/ML IJ SOLN
1000.0000 ug | Freq: Once | INTRAMUSCULAR | Status: AC
  Administered 2017-04-07: 1000 ug via INTRAMUSCULAR

## 2017-04-07 NOTE — Progress Notes (Signed)
I have reviewed and agree with note, evaluation, plan. Agree with plan to see optho. Discussed visusal changes with Manuela Schwartz and it  only lasts seconds. She offered to have patient see me today and he declined.   [name], MD

## 2017-04-07 NOTE — Patient Instructions (Addendum)
Mr. Dave , Thank you for taking time to come for your Medicare Wellness Visit. I appreciate your ongoing commitment to your health goals. Please review the following plan we discussed and let me know if I can assist you in the future.   A Tetanus is recommended every 10 years. Medicare covers a tetanus if you have a cut or wound; otherwise, there may be a charge. If you had not had a tetanus with pertusses, known as the Tdap, you can take this anytime.   Will try to complete AD; Given copy  Referred to [facility] for questions [place] offers free advance directive forms, as well as assistance in completing the forms themselves. For assistance, contact the Spiritual Care Department at [phone], or the Clinical Social Work Department at [phone].  Will have a hearing test in the near future Deaf & Hard of Hearing Division Services - can assist with hearing aid x 1  No reviews  [facility]  [id number]  [phone] - help assist with one hearing aid   Will schedule an eye with Dr. Gershon Crane ASAP!   These are the goals we discussed: Goals    None      This is a list of the screening recommended for you and due dates:  Health Maintenance  Topic Date Due  . Tetanus Vaccine  03/17/2017  . Flu Shot  06/17/2017  . Pneumonia vaccines  Completed        Fall Prevention in the Home Falls can cause injuries. They can happen to people of all ages. There are many things you can do to make your home safe and to help prevent falls. What can I do on the outside of my home?  Regularly fix the edges of walkways and driveways and fix any cracks.  Remove anything that might make you trip as you walk through a door, such as a raised step or threshold.  Trim any bushes or trees on the path to your home.  Use bright outdoor lighting.  Clear any walking paths of anything that might make someone trip, such as rocks or tools.  Regularly check to see if handrails  are loose or broken. Make sure that both sides of any steps have handrails.  Any raised decks and porches should have guardrails on the edges.  Have any leaves, snow, or ice cleared regularly.  Use sand or salt on walking paths during winter.  Clean up any spills in your garage right away. This includes oil or grease spills. What can I do in the bathroom?  Use night lights.  Install grab bars by the toilet and in the tub and shower. Do not use towel bars as grab bars.  Use non-skid mats or decals in the tub or shower.  If you need to sit down in the shower, use a plastic, non-slip stool.  Keep the floor dry. Clean up any water that spills on the floor as soon as it happens.  Remove soap buildup in the tub or shower regularly.  Attach bath mats securely with double-sided non-slip rug tape.  Do not have throw rugs and other things on the floor that can make you trip. What can I do in the bedroom?  Use night lights.  Make sure that you have a light by your bed that is easy to reach.  Do not use any sheets or blankets that are too big for your bed. They should not hang down onto the  floor.  Have a firm chair that has side arms. You can use this for support while you get dressed.  Do not have throw rugs and other things on the floor that can make you trip. What can I do in the kitchen?  Clean up any spills right away.  Avoid walking on wet floors.  Keep items that you use a lot in easy-to-reach places.  If you need to reach something above you, use a strong step stool that has a grab bar.  Keep electrical cords out of the way.  Do not use floor polish or wax that makes floors slippery. If you must use wax, use non-skid floor wax.  Do not have throw rugs and other things on the floor that can make you trip. What can I do with my stairs?  Do not leave any items on the stairs.  Make sure that there are handrails on both sides of the stairs and use them. Fix handrails  that are broken or loose. Make sure that handrails are as long as the stairways.  Check any carpeting to make sure that it is firmly attached to the stairs. Fix any carpet that is loose or worn.  Avoid having throw rugs at the top or bottom of the stairs. If you do have throw rugs, attach them to the floor with carpet tape.  Make sure that you have a light switch at the top of the stairs and the bottom of the stairs. If you do not have them, ask someone to add them for you. What else can I do to help prevent falls?  Wear shoes that:  Do not have high heels.  Have rubber bottoms.  Are comfortable and fit you well.  Are closed at the toe. Do not wear sandals.  If you use a stepladder:  Make sure that it is fully opened. Do not climb a closed stepladder.  Make sure that both sides of the stepladder are locked into place.  Ask someone to hold it for you, if possible.  Clearly mark and make sure that you can see:  Any grab bars or handrails.  First and last steps.  Where the edge of each step is.  Use tools that help you move around (mobility aids) if they are needed. These include:  Canes.  Walkers.  Scooters.  Crutches.  Turn on the lights when you go into a dark area. Replace any light bulbs as soon as they burn out.  Set up your furniture so you have a clear path. Avoid moving your furniture around.  If any of your floors are uneven, fix them.  If there are any pets around you, be aware of where they are.  Review your medicines with your doctor. Some medicines can make you feel dizzy. This can increase your chance of falling. Ask your doctor what other things that you can do to help prevent falls. This information is not intended to replace advice given to you by your health care provider. Make sure you discuss any questions you have with your health care provider. Document Released: 08/30/2009 Document Revised: 04/10/2016 Document Reviewed: 12/08/2014 Elsevier  Interactive Patient Education  2017 [place] Maintenance, Male A healthy lifestyle and preventive care is important for your health and wellness. Ask your health care provider about what schedule of regular examinations is right for you. What should I know about weight and diet?  Eat a Healthy Diet  Eat plenty of vegetables, fruits, whole  grains, low-fat dairy products, and lean protein.  Do not eat a lot of foods high in solid fats, added sugars, or salt. Maintain a Healthy Weight  Regular exercise can help you achieve or maintain a healthy weight. You should:  Do at least 150 minutes of exercise each week. The exercise should increase your heart rate and make you sweat (moderate-intensity exercise).  Do strength-training exercises at least twice a week. Watch Your Levels of Cholesterol and Blood Lipids  Have your blood tested for lipids and cholesterol every 5 years starting at 79 years of age. If you are at high risk for heart disease, you should start having your blood tested when you are 79 years old. You may need to have your cholesterol levels checked more often if:  Your lipid or cholesterol levels are high.  You are older than 79 years of age.  You are at high risk for heart disease. What should I know about cancer screening? Many types of cancers can be detected early and may often be prevented. Lung Cancer  You should be screened every year for lung cancer if:  You are a current smoker who has smoked for at least 30 years.  You are a former smoker who has quit within the past 15 years.  Talk to your health care provider about your screening options, when you should start screening, and how often you should be screened. Colorectal Cancer  Routine colorectal cancer screening usually begins at 79 years of age and should be repeated every 5-10 years until you are 79 years old. You may need to be screened more often if early forms of precancerous polyps or  small growths are found. Your health care provider may recommend screening at an earlier age if you have risk factors for colon cancer.  Your health care provider may recommend using home test kits to check for hidden blood in the stool.  A small camera at the end of a tube can be used to examine your colon (sigmoidoscopy or colonoscopy). This checks for the earliest forms of colorectal cancer. Prostate and Testicular Cancer  Depending on your age and overall health, your health care provider may do certain tests to screen for prostate and testicular cancer.  Talk to your health care provider about any symptoms or concerns you have about testicular or prostate cancer. Skin Cancer  Check your skin from head to toe regularly.  Tell your health care provider about any new moles or changes in moles, especially if:  There is a change in a mole's size, shape, or color.  You have a mole that is larger than a pencil eraser.  Always use sunscreen. Apply sunscreen liberally and repeat throughout the day.  Protect yourself by wearing long sleeves, pants, a wide-brimmed hat, and sunglasses when outside. What should I know about heart disease, diabetes, and high blood pressure?  If you are 31-64 years of age, have your blood pressure checked every 3-5 years. If you are 25 years of age or older, have your blood pressure checked every year. You should have your blood pressure measured twice-once when you are at a hospital or clinic, and once when you are not at a hospital or clinic. Record the average of the two measurements. To check your blood pressure when you are not at a hospital or clinic, you can use:  An automated blood pressure machine at a pharmacy.  A home blood pressure monitor.  Talk to your health care provider about your  target blood pressure.  If you are between 22-44 years old, ask your health care provider if you should take aspirin to prevent heart disease.  Have regular  diabetes screenings by checking your fasting blood sugar level.  If you are at a normal weight and have a low risk for diabetes, have this test once every three years after the age of 70.  If you are overweight and have a high risk for diabetes, consider being tested at a younger age or more often.  A one-time screening for abdominal aortic aneurysm (AAA) by ultrasound is recommended for men aged 61-75 years who are current or former smokers. What should I know about preventing infection? Hepatitis B  If you have a higher risk for hepatitis B, you should be screened for this virus. Talk with your health care provider to find out if you are at risk for hepatitis B infection. Hepatitis C  Blood testing is recommended for:  Everyone born from [birth year] through 1963.  Anyone with known risk factors for hepatitis C. Sexually Transmitted Diseases (STDs)  You should be screened each year for STDs including gonorrhea and chlamydia if:  You are sexually active and are younger than 79 years of age.  You are older than 79 years of age and your health care provider tells you that you are at risk for this type of infection.  Your sexual activity has changed since you were last screened and you are at an increased risk for chlamydia or gonorrhea. Ask your health care provider if you are at risk.  Talk with your health care provider about whether you are at high risk of being infected with HIV. Your health care provider may recommend a prescription medicine to help prevent HIV infection. What else can I do?  Schedule regular health, dental, and eye exams.  Stay current with your vaccines (immunizations).  Do not use any tobacco products, such as cigarettes, chewing tobacco, and e-cigarettes. If you need help quitting, ask your health care provider.  Limit alcohol intake to no more than 2 drinks per day. One drink equals 12 ounces of beer, 5 ounces of wine, or 1 ounces of hard liquor.  Do not use  street drugs.  Do not share needles.  Ask your health care provider for help if you need support or information about quitting drugs.  Tell your health care provider if you often feel depressed.  Tell your health care provider if you have ever been abused or do not feel safe at home. This information is not intended to replace advice given to you by your health care provider. Make sure you discuss any questions you have with your health care provider. Document Released: 05/01/2008 Document Revised: 07/02/2016 Document Reviewed: 08/07/2015 Elsevier Interactive Patient Education  2017 [employer].
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
## 2017-04-09 DIAGNOSIS — H43812 Vitreous degeneration, left eye: Secondary | ICD-10-CM | POA: Diagnosis not present

## 2017-04-15 ENCOUNTER — Encounter: Payer: Self-pay | Admitting: Family Medicine

## 2017-04-15 ENCOUNTER — Ambulatory Visit (INDEPENDENT_AMBULATORY_CARE_PROVIDER_SITE_OTHER): Payer: Medicare Other | Admitting: Family Medicine

## 2017-04-15 DIAGNOSIS — R109 Unspecified abdominal pain: Secondary | ICD-10-CM | POA: Diagnosis not present

## 2017-04-15 NOTE — Progress Notes (Signed)
Subjective:  Timothy Gallagher is a 81 y.o. year old very pleasant male patient who presents for/with See problem oriented charting ROS- No chest pain or shortness of breath. No headache or blurry vision. No fever or chills. no nausea or vomiting   Past Medical History-  Patient Active Problem List   Diagnosis Date Noted  . CAD (coronary artery disease) of artery bypass graft 05/19/2014    Priority: High  . BPPV (benign paroxysmal positional vertigo) 09/01/2016    Priority: Medium  . Insomnia 11/22/2014    Priority: Medium  . Anemia 07/20/2014    Priority: Medium  . BPH (benign prostatic hyperplasia) 12/05/2013    Priority: Medium  . Hyperlipidemia 03/13/2008    Priority: Medium  . B12 deficiency 06/07/2007    Priority: Medium  . Essential hypertension 04/09/2007    Priority: Medium  . Low back pain 02/06/2017    Priority: Low  . Internal bleeding hemorrhoids 01/22/2015    Priority: Low  . Palpitations 02/11/2013    Priority: Low  . OSTEOARTHRITIS, WRIST, RIGHT 08/05/2010    Priority: Low  . ACNE ROSACEA 06/27/2009    Priority: Low  . ESOPHAGEAL STRICTURE 04/27/2009    Priority: Low  . ACTINIC KERATOSIS, HEAD 04/18/2009    Priority: Low  . NECK PAIN 09/14/2008    Priority: Low  . NEUROPATHY, IDIOPATHIC PERIPHERAL NEC 08/11/2007    Priority: Low  . Irritable bowel syndrome 08/11/2007    Priority: Low  . ALLERGIC RHINITIS 04/09/2007    Priority: Low  . Anal fissure 04/07/2016    Medications- reviewed and updated Current Outpatient Prescriptions  Medication Sig Dispense Refill  . acetaminophen (TYLENOL) 500 MG tablet Take 500 mg by mouth daily as needed (for pain). Reported on 04/11/2016    . aspirin 81 MG EC tablet Take 1 tablet (81 mg total) by mouth daily.    . cetirizine (ZYRTEC) 10 MG tablet Take 10 mg by mouth daily as needed for allergies.     . fluticasone (FLONASE) 50 MCG/ACT nasal spray USE 1 TO 2 SPRAYS IN EACH NOSTRIL DAILY AS NEEDED FOR ALLERGIES 16 g 3   . metoprolol succinate (TOPROL-XL) 25 MG 24 hr tablet Take 0.5 tablets (12.5 mg total) by mouth daily. 46 tablet 3  . Multiple Vitamins-Minerals (CENTRUM SILVER PO) Take 1 tablet by mouth daily.    [name] Kitchen PRESCRIPTION MEDICATION VIT B-12 Take one (1) injection into the muscle every four (4) weeks.    . tamsulosin (FLOMAX) 0.4 MG CAPS capsule Take 0.4 mg by mouth at bedtime.  6  . valsartan (DIOVAN) 40 MG tablet Take 1 tablet (40 mg total) by mouth daily. 90 tablet 3  . zolpidem (AMBIEN) 5 MG tablet TAKE 1 TABLET BY MOUTH AT BEDTIME AS NEEDED FOR SLEEP 30 tablet 5  . atorvastatin (LIPITOR) 20 MG tablet Take 1 tablet (20 mg total) by mouth daily. 90 tablet 3   No current facility-administered medications for this visit.     Objective: BP 128/72 (BP Location: Left Arm, Patient Position: Sitting, Cuff Size: Large)   Pulse 64   Temp 97.9 F (36.6 C) (Oral)   Ht 6\' 1"  (1.854 m)   Wt 200 lb 9.6 oz (91 kg)   SpO2 96%   BMI 26.47 kg/m  Gen: NAD, resting comfortably CV: RRR no murmurs rubs or gallops Lungs: CTAB no crackles, wheeze, rhonchi Abdomen: soft/nontender except along mid axillary line below ribs he is painful to palpation- if he flexes abdomen and then  palpated on- more severe pain/nondistended/normal bowel sounds. No rebound or guarding.  Ext: no edema Skin: warm, dry  Assessment/Plan:  Left sided abdominal pain S: Patient with left sided pain for at least a year. Saw urology last year and had CT 05/2016 which did not show any causative kidney stones or mass. Pain is along mid axillary line justbelow ribs. Pain is mild most of the time but when he lays down at night can be more moderate and makes it hard to sleep. Worse with bending or twisting. Better if he is standing up and not using abdominal muscles minimally. Tylenol helps but scared to take more than 500mg  once a day.  A/P: This appears to be MSK strain- likely pulled muscle possibly transversus abdominis. Discussed trial tylenol  500mg  TID for a few days and also use heat more regularly. Fact he has had Prior imaging reassuring with CT (Though noncontrast). No obvious mass felt. If fails to slowly improve- advised follow up. Offered core strengthening program with PT but he declines.   Return precautions advised. PRN for acut eissue [name], MD

## 2017-04-15 NOTE — Patient Instructions (Addendum)
Have appeared to pull a muscle in your side again  Would use tylenol 500mg  about every 6 hours since that is helping  Would also use heating pad for 20 minutes 3-4x a day  Discussed option of physical therapy referral for core strengthening- would do this after things have improved to try to help prevent the next occurence

## 2017-04-15 NOTE — Assessment & Plan Note (Addendum)
S: Patient with left sided pain for at least a year. Saw urology last year and had CT 05/2016 which did not show any causative kidney stones or mass. Pain is along mid axillary line justbelow ribs. Pain is mild most of the time but when he lays down at night can be more moderate and makes it hard to sleep. Worse with bending or twisting. Better if he is standing up and not using abdominal muscles minimally. Tylenol helps but scared to take more than 500mg  once a day.  A/P: This appears to be MSK strain- likely pulled muscle possibly transversus abdominis. Discussed trial tylenol 500mg  TID for a few days and also use heat more regularly. Fact he has had Prior imaging reassuring with CT (Though noncontrast). No obvious mass felt. If fails to slowly improve- advised follow up. Offered core strengthening program with PT but he declines.

## 2017-04-22 ENCOUNTER — Ambulatory Visit (INDEPENDENT_AMBULATORY_CARE_PROVIDER_SITE_OTHER): Payer: Medicare Other | Admitting: Internal Medicine

## 2017-04-22 ENCOUNTER — Encounter: Payer: Self-pay | Admitting: Internal Medicine

## 2017-04-22 DIAGNOSIS — K644 Residual hemorrhoidal skin tags: Secondary | ICD-10-CM | POA: Diagnosis not present

## 2017-04-22 DIAGNOSIS — K648 Other hemorrhoids: Secondary | ICD-10-CM

## 2017-04-22 NOTE — Progress Notes (Signed)
   Timothy Gallagher 81 y.o. 1936/01/31 [id number]  Assessment & Plan:  Internal and external bleeding hemorrhoids Slight bleeding - only when stools are "tight" Try Benefiber 1-2 tbsp/day  F/u prn  I appreciate the opportunity to care for this patient. CC: Timothy Olp, MD  Subjective:   Chief Complaint: rectal bleeding  HPI The patient has a history of anal fissure and banding of hemorrhoids in the past in 2016 in 2017. His symptoms are much better but he has episodic small amounts of bright red blood on the tissue paper when his stools get "tight". This may happen about once a week. Otherwise his bowel habits are regular. He says he tries to eat a high-fiber diet. He does have some concern about whether or not he could have cancer. He's had numerous colonoscopies over the years the last being in 2011 that was negative. Long history of irritable bowel syndrome previously followed by Timothy Gallagher. Hemoglobin has been stable slightly low around 12 with a low normal MCV consistent with chronic disease anemia.  When I had seen him in early 2017 and treated him for fissure with diltiazem gel, it seems like the fissure symptoms got better but he had nausea didn't quite feel right and Timothy Gallagher stopped his diltiazem gel as it seemed like the side effects with the way he was feeling was temporarily associated.  Medications, allergies, past medical history, past surgical history, family history and social history are reviewed and updated in the EMR.  Review of Systems As above  Objective:   Physical Exam BP 136/74 (BP Location: Right Arm, Patient Position: Sitting, Cuff Size: Normal)   Pulse 85   Ht 6\' 1"  (1.854 m)   Wt 200 lb (90.7 kg)   BMI 26.39 kg/m   Rectal - mild perianal erythema, slight stenosis, formed brown stool sl tender  Anoscopy was performed with the patient in the left lateral decubitus position while a chaperone was present and revealed inflamed external  hemorrhods all posyion

## 2017-04-22 NOTE — Patient Instructions (Signed)
  Today we are giving you a handout to read and follow for benefiber.  Follow up as needed with Dr [name].  I appreciate the opportunity to care for you. Silvano Rusk, MD, [facility]

## 2017-04-22 NOTE — Assessment & Plan Note (Signed)
Slight bleeding - only when stools are "tight" Try Benefiber 1-2 tbsp/day  F/u prn

## 2017-04-23 ENCOUNTER — Telehealth: Payer: Self-pay | Admitting: Internal Medicine

## 2017-04-23 NOTE — Telephone Encounter (Signed)
Patient reports that he has had intermittent LLQ pain for about 5-6 months. He reports that the pain is worse at night when he is lays down. Pain is relieved by Tylenol. He is asking what you think this could be?

## 2017-04-23 NOTE — Telephone Encounter (Signed)
Patient notified

## 2017-04-23 NOTE — Telephone Encounter (Signed)
I think probably is IBS

## 2017-05-05 ENCOUNTER — Ambulatory Visit: Payer: Medicare Other

## 2017-05-05 ENCOUNTER — Encounter: Payer: Self-pay | Admitting: Podiatry

## 2017-05-05 ENCOUNTER — Ambulatory Visit (INDEPENDENT_AMBULATORY_CARE_PROVIDER_SITE_OTHER): Payer: Medicare Other | Admitting: Family Medicine

## 2017-05-05 ENCOUNTER — Ambulatory Visit (INDEPENDENT_AMBULATORY_CARE_PROVIDER_SITE_OTHER): Payer: Medicare Other | Admitting: Podiatry

## 2017-05-05 ENCOUNTER — Other Ambulatory Visit: Payer: Self-pay

## 2017-05-05 ENCOUNTER — Ambulatory Visit (INDEPENDENT_AMBULATORY_CARE_PROVIDER_SITE_OTHER): Payer: Medicare Other

## 2017-05-05 ENCOUNTER — Encounter: Payer: Self-pay | Admitting: Family Medicine

## 2017-05-05 VITALS — BP 124/70 | HR 81 | Temp 98.3°F | Ht 73.0 in | Wt 199.4 lb

## 2017-05-05 DIAGNOSIS — M79671 Pain in right foot: Secondary | ICD-10-CM

## 2017-05-05 DIAGNOSIS — M722 Plantar fascial fibromatosis: Secondary | ICD-10-CM | POA: Diagnosis not present

## 2017-05-05 DIAGNOSIS — E538 Deficiency of other specified B group vitamins: Secondary | ICD-10-CM | POA: Diagnosis not present

## 2017-05-05 DIAGNOSIS — Q828 Other specified congenital malformations of skin: Secondary | ICD-10-CM

## 2017-05-05 DIAGNOSIS — R109 Unspecified abdominal pain: Secondary | ICD-10-CM | POA: Diagnosis not present

## 2017-05-05 LAB — COMPREHENSIVE METABOLIC PANEL
ALT: 13 U/L (ref 0–53)
AST: 18 U/L (ref 0–37)
Albumin: 4 g/dL (ref 3.5–5.2)
Alkaline Phosphatase: 71 U/L (ref 39–117)
BUN: 19 mg/dL (ref 6–23)
CO2: 26 mEq/L (ref 19–32)
Calcium: 9 mg/dL (ref 8.4–10.5)
Chloride: 108 mEq/L (ref 96–112)
Creatinine, Ser: 0.89 mg/dL (ref 0.40–1.50)
GFR: 87.67 mL/min (ref >60.00)
Glucose, Bld: 110 mg/dL — ABNORMAL HIGH (ref 70–99)
Potassium: 4.4 mEq/L (ref 3.5–5.1)
Sodium: 141 mEq/L (ref 135–145)
Total Bilirubin: 0.4 mg/dL (ref 0.2–1.2)
Total Protein: 6.5 g/dL (ref 6.0–8.3)

## 2017-05-05 LAB — CBC WITH DIFFERENTIAL/PLATELET
Basophils Absolute: 0 10*3/uL (ref 0.0–0.1)
Basophils Relative: 0.6 % (ref 0.0–3.0)
Eosinophils Absolute: 0.1 10*3/uL (ref 0.0–0.7)
Eosinophils Relative: 2.6 % (ref 0.0–5.0)
HCT: 37.5 % — ABNORMAL LOW (ref 39.0–52.0)
Hemoglobin: 12.3 g/dL — ABNORMAL LOW (ref 13.0–17.0)
Lymphocytes Relative: 23.7 % (ref 12.0–46.0)
Lymphs Abs: 1.1 10*3/uL (ref 0.7–4.0)
MCHC: 32.7 g/dL (ref 30.0–36.0)
MCV: 83 fl (ref 78.0–100.0)
Monocytes Absolute: 0.3 10*3/uL (ref 0.1–1.0)
Monocytes Relative: 6.9 % (ref 3.0–12.0)
Neutro Abs: 3 10*3/uL (ref 1.4–7.7)
Neutrophils Relative %: 66.2 % (ref 43.0–77.0)
Platelets: 152 10*3/uL (ref 150.0–400.0)
RBC: 4.52 Mil/uL (ref 4.22–5.81)
RDW: 15.4 % (ref 11.5–15.5)
WBC: 4.5 10*3/uL (ref 4.0–10.5)

## 2017-05-05 LAB — POC URINALSYSI DIPSTICK (AUTOMATED)
Bilirubin, UA: NEGATIVE
Blood, UA: NEGATIVE
Glucose, UA: NEGATIVE
Ketones, UA: NEGATIVE
Leukocytes, UA: NEGATIVE
Nitrite, UA: NEGATIVE
Protein, UA: NEGATIVE
Spec Grav, UA: >=1.03 — AB (ref 1.010–1.025)
Urobilinogen, UA: 0.2 E.U./dL
pH, UA: 6 (ref 5.0–8.0)

## 2017-05-05 MED ORDER — METHYLPREDNISOLONE 4 MG PO TBPK: ORAL_TABLET | ORAL | 0 refills | Status: DC

## 2017-05-05 MED ORDER — CYANOCOBALAMIN 1000 MCG/ML IJ SOLN
1000.0000 ug | Freq: Once | INTRAMUSCULAR | Status: AC
  Administered 2017-05-05: 1000 ug via INTRAMUSCULAR

## 2017-05-05 MED ORDER — METHYLPREDNISOLONE 4 MG PO TBPK
ORAL_TABLET | ORAL | 0 refills | Status: DC
Start: 2017-05-05 — End: 2017-05-05

## 2017-05-05 NOTE — Progress Notes (Signed)
Subjective:  LEMAR BAKOS is a 81 y.o. year old very pleasant male patient who presents for/with See problem oriented charting ROS- no fever, chills, nausea, vomiting. No change in bowel habits.    Past Medical History-  Patient Active Problem List   Diagnosis Date Noted  . CAD (coronary artery disease) of artery bypass graft 05/19/2014    Priority: High  . BPPV (benign paroxysmal positional vertigo) 09/01/2016    Priority: Medium  . Insomnia 11/22/2014    Priority: Medium  . Anemia 07/20/2014    Priority: Medium  . BPH (benign prostatic hyperplasia) 12/05/2013    Priority: Medium  . Hyperlipidemia 03/13/2008    Priority: Medium  . B12 deficiency 06/07/2007    Priority: Medium  . Essential hypertension 04/09/2007    Priority: Medium  . Low back pain 02/06/2017    Priority: Low  . Internal and external bleeding hemorrhoids 01/22/2015    Priority: Low  . Palpitations 02/11/2013    Priority: Low  . OSTEOARTHRITIS, WRIST, RIGHT 08/05/2010    Priority: Low  . ACNE ROSACEA 06/27/2009    Priority: Low  . ESOPHAGEAL STRICTURE 04/27/2009    Priority: Low  . ACTINIC KERATOSIS, HEAD 04/18/2009    Priority: Low  . NECK PAIN 09/14/2008    Priority: Low  . NEUROPATHY, IDIOPATHIC PERIPHERAL NEC 08/11/2007    Priority: Low  . Irritable bowel syndrome 08/11/2007    Priority: Low  . ALLERGIC RHINITIS 04/09/2007    Priority: Low  . Left sided abdominal pain 04/15/2017  . Anal fissure 04/07/2016    Medications- reviewed and updated Current Outpatient Prescriptions  Medication Sig Dispense Refill  . acetaminophen (TYLENOL) 500 MG tablet Take 500 mg by mouth daily as needed (for pain). Reported on 04/11/2016    . aspirin 81 MG EC tablet Take 1 tablet (81 mg total) by mouth daily.    . cetirizine (ZYRTEC) 10 MG tablet Take 10 mg by mouth daily as needed for allergies.     . fluticasone (FLONASE) 50 MCG/ACT nasal spray USE 1 TO 2 SPRAYS IN EACH NOSTRIL DAILY AS NEEDED FOR ALLERGIES  16 g 3  . methylPREDNISolone (MEDROL DOSEPAK) 4 MG TBPK tablet 6 day dose pack - take as directed 21 tablet 0  . metoprolol succinate (TOPROL-XL) 25 MG 24 hr tablet Take 0.5 tablets (12.5 mg total) by mouth daily. 46 tablet 3  . Multiple Vitamins-Minerals (CENTRUM SILVER PO) Take 1 tablet by mouth daily.    [name] Kitchen PRESCRIPTION MEDICATION VIT B-12 Take one (1) injection into the muscle every four (4) weeks.    . tamsulosin (FLOMAX) 0.4 MG CAPS capsule Take 0.4 mg by mouth at bedtime.  6  . valsartan (DIOVAN) 40 MG tablet Take 1 tablet (40 mg total) by mouth daily. 90 tablet 3  . zolpidem (AMBIEN) 5 MG tablet TAKE 1 TABLET BY MOUTH AT BEDTIME AS NEEDED FOR SLEEP 30 tablet 5  . atorvastatin (LIPITOR) 20 MG tablet Take 1 tablet (20 mg total) by mouth daily. 90 tablet 3   No current facility-administered medications for this visit.     Objective: BP 124/70 (BP Location: Left Arm, Patient Position: Sitting, Cuff Size: Large)   Pulse 81   Temp 98.3 F (36.8 C) (Oral)   Ht 6\' 1"  (1.854 m)   Wt 199 lb 6.4 oz (90.4 kg)   SpO2 95%   BMI 26.31 kg/m  Gen: NAD, resting comfortably CV: RRR no murmurs rubs or gallops Lungs: CTAB no crackles, wheeze, rhonchi  Abdomen: soft/tender below ribs along mid axillary line on left side down to above hip, minimal pain with palpation I nLLQ without clear hernia. nondistended/normal bowel sounds. No rebound or guarding.  Ext: no edema Skin: warm, dry  Assessment/Plan:  Left sided abdominal pain - Plan: CBC with Differential/Platelet, Comprehensive metabolic panel, CT Abdomen Pelvis W Contrast S: Left sided abdominal pain- lower abdomen for the last month. Pain had been just under ribs at mid axillary line and has moved down some. Passed kidney stone on Sunday. Pain has been up to 10/10. No fever or chills. No nausea or vomiting. Heat helps some. Tylenol helps some. Comes and goes at leas t2-3x a day gets up to 10/10- sits down and rests and often wears off. Has  nonfirm bowel movement each day A/P: 81 year old male with at least a year of left sided abdominal pain, worse in last month with pain in LLQ. No clear cause stands out from exam or history.  Patient Instructions  Unclear cause of abdominal pain. Think a muscle strain is most likely but since it has been going on for so long lets get CT scan to be sure.   Please stop by lab before you go  We will call you within a week or two about your referral CT scan adbomen/pelvis. If you do not hear within 3 weeks, give [place] a call.    Orders Placed This Encounter  Procedures  . CT Abdomen Pelvis W Contrast    Standing Status:   Future    Standing Expiration Date:   08/05/2018    Order Specific Question:   If indicated for the ordered procedure, I authorize the administration of contrast media per Radiology protocol    Answer:   Yes    Order Specific Question:   Reason for Exam (SYMPTOM  OR DIAGNOSIS REQUIRED)    Answer:   left sided abdominal pain for a year around mid axillary line down below rib. now with pain one month LLQ. doubt hernia but rule out. evaluate diverticulitis.    Order Specific Question:   Preferred imaging location?    Answer:   [address]    Order Specific Question:   Radiology Contrast Protocol - do NOT remove file path    Answer:   [email or web address]  . CBC with Differential/Platelet  . Comprehensive metabolic panel    [place]    Meds ordered this encounter  Medications  . cyanocobalamin ((VITAMIN B-12)) injection 1,000 mcg   Return precautions advised.  [name], MD

## 2017-05-05 NOTE — Addendum Note (Signed)
Addended by: [name] on: 05/05/2017 01:26 PM   Modules accepted: Orders

## 2017-05-05 NOTE — Progress Notes (Signed)
   Subjective:    Patient ID: Timothy Gallagher, male    DOB: Apr 28, 1936, 81 y.o.   MRN: [id number]  HPI: She presents today with a painful right heel is been aching for about a month mornings are particularly bad she's tried Tylenol and ice to no about.    Review of Systems  HENT: Positive for tinnitus.   Eyes: Positive for visual disturbance.  Neurological: Positive for dizziness.  All other systems reviewed and are negative.      Objective:   Physical Exam: Pulses are stable she is alert and oriented 3. Pulses are palpable. Neurologic sensorium is intact tendon reflexes are intact muscle strength is normal. Orthopedic evaluation straight saw just distal to the ankle for range of motion without crepitus she is pain on palpation medial calcaneal tubercle right greater than left. Multiple porokeratosis plantar aspect of the right foot. No open lesions or wounds. Radiographs taken today do demonstrate soft tissue increased density plantar fascia insertion site.        Assessment & Plan:  Plantar fasciitis with porokeratosis right foot.  Plan: Debrided already hyperkeratosis injected the right heel today with Kenalog and local anesthetic Cerner her Medrol Dosepak to be followed by meloxicam. Myofascial brace and nicely were dispensed discussed appropriate shoe gear stretching exercises and ice therapy.

## 2017-05-05 NOTE — Patient Instructions (Signed)
Unclear cause of abdominal pain. Think a muscle strain is most likely but since it has been going on for so long lets get CT scan to be sure.   Please stop by lab before you go  We will call you within a week or two about your referral CT scan adbomen/pelvis. If you do not hear within 3 weeks, give [place] a call.

## 2017-05-05 NOTE — Patient Instructions (Signed)

## 2017-05-06 ENCOUNTER — Telehealth: Payer: Self-pay | Admitting: Family Medicine

## 2017-05-06 NOTE — Telephone Encounter (Signed)
Pt states he does not know why Dr Yong Channel prescribed  methylPREDNISolone (MEDROL DOSEPAK) 4 MG TBPK tablet   And he is not going to take.

## 2017-05-06 NOTE — Telephone Encounter (Signed)
Called and spoke to patient who stated he doesn't feel he needs the prescription so he isn't going to take it. His podiatrist had ordered the medication

## 2017-05-07 ENCOUNTER — Ambulatory Visit: Payer: Medicare Other | Admitting: Podiatry

## 2017-05-08 ENCOUNTER — Telehealth: Payer: Self-pay | Admitting: *Deleted

## 2017-05-08 NOTE — Telephone Encounter (Signed)
Pt states received injection 05/05/2017 in  Right heel, and felt okay initially, but now hurts, taking tylenol without relief. Unable to speak with pt his phone was busy. Pt called, states yesterday his foot just about killed him, but today is better. I encouraged pt to continue the ice therapy he said he was doing, rest and wear supportive shoes. Pt states he was to have a heart test next week and hoped he could do it then. I told him he would probably be able to and to wear supportive shoes.

## 2017-05-11 ENCOUNTER — Ambulatory Visit (INDEPENDENT_AMBULATORY_CARE_PROVIDER_SITE_OTHER)
Admission: RE | Admit: 2017-05-11 | Discharge: 2017-05-11 | Disposition: A | Discharge status: Home / Self Care | Payer: Medicare Other | Source: Ambulatory Visit | Attending: Family Medicine | Admitting: Family Medicine

## 2017-05-11 DIAGNOSIS — R109 Unspecified abdominal pain: Secondary | ICD-10-CM | POA: Diagnosis not present

## 2017-05-11 DIAGNOSIS — K5730 Diverticulosis of large intestine without perforation or abscess without bleeding: Secondary | ICD-10-CM | POA: Diagnosis not present

## 2017-05-11 MED ORDER — IOPAMIDOL (ISOVUE-300) INJECTION 61%
100.0000 mL | Freq: Once | INTRAVENOUS | Status: AC | PRN
  Administered 2017-05-11: 100 mL via INTRAVENOUS

## 2017-05-12 ENCOUNTER — Encounter: Payer: Self-pay | Admitting: Family Medicine

## 2017-05-12 DIAGNOSIS — I714 Abdominal aortic aneurysm, without rupture, unspecified: Secondary | ICD-10-CM | POA: Insufficient documentation

## 2017-05-12 DIAGNOSIS — I7143 Infrarenal abdominal aortic aneurysm, without rupture: Secondary | ICD-10-CM | POA: Insufficient documentation

## 2017-06-02 ENCOUNTER — Ambulatory Visit (INDEPENDENT_AMBULATORY_CARE_PROVIDER_SITE_OTHER): Payer: Medicare Other | Admitting: Family Medicine

## 2017-06-02 DIAGNOSIS — E538 Deficiency of other specified B group vitamins: Secondary | ICD-10-CM | POA: Diagnosis not present

## 2017-06-02 MED ORDER — CYANOCOBALAMIN 1000 MCG/ML IJ SOLN
1000.0000 ug | Freq: Once | INTRAMUSCULAR | Status: AC
  Administered 2017-06-02: 1000 ug via INTRAMUSCULAR

## 2017-06-09 ENCOUNTER — Ambulatory Visit: Payer: Medicare Other | Admitting: Podiatry

## 2017-06-15 DIAGNOSIS — H43812 Vitreous degeneration, left eye: Secondary | ICD-10-CM | POA: Diagnosis not present

## 2017-06-15 DIAGNOSIS — Z961 Presence of intraocular lens: Secondary | ICD-10-CM | POA: Diagnosis not present

## 2017-06-15 DIAGNOSIS — H33022 Retinal detachment with multiple breaks, left eye: Secondary | ICD-10-CM | POA: Diagnosis not present

## 2017-06-15 DIAGNOSIS — H3320 Serous retinal detachment, unspecified eye: Secondary | ICD-10-CM | POA: Diagnosis not present

## 2017-06-15 DIAGNOSIS — H43392 Other vitreous opacities, left eye: Secondary | ICD-10-CM | POA: Diagnosis not present

## 2017-06-17 DIAGNOSIS — H3342 Traction detachment of retina, left eye: Secondary | ICD-10-CM | POA: Diagnosis not present

## 2017-06-17 DIAGNOSIS — H33022 Retinal detachment with multiple breaks, left eye: Secondary | ICD-10-CM | POA: Diagnosis not present

## 2017-06-30 ENCOUNTER — Ambulatory Visit (INDEPENDENT_AMBULATORY_CARE_PROVIDER_SITE_OTHER): Payer: Medicare Other | Admitting: Family Medicine

## 2017-06-30 ENCOUNTER — Telehealth: Payer: Self-pay | Admitting: Family Medicine

## 2017-06-30 ENCOUNTER — Encounter: Payer: Self-pay | Admitting: Family Medicine

## 2017-06-30 VITALS — BP 138/76 | HR 67 | Temp 98.0°F | Ht 73.0 in | Wt 197.6 lb

## 2017-06-30 DIAGNOSIS — E538 Deficiency of other specified B group vitamins: Secondary | ICD-10-CM

## 2017-06-30 DIAGNOSIS — I10 Essential (primary) hypertension: Secondary | ICD-10-CM | POA: Diagnosis not present

## 2017-06-30 DIAGNOSIS — N401 Enlarged prostate with lower urinary tract symptoms: Secondary | ICD-10-CM

## 2017-06-30 DIAGNOSIS — H3322 Serous retinal detachment, left eye: Secondary | ICD-10-CM

## 2017-06-30 DIAGNOSIS — R351 Nocturia: Secondary | ICD-10-CM

## 2017-06-30 DIAGNOSIS — R7989 Other specified abnormal findings of blood chemistry: Secondary | ICD-10-CM

## 2017-06-30 MED ORDER — IRBESARTAN 75 MG PO TABS: 37.5000 mg | ORAL_TABLET | Freq: Every day | ORAL | 3 refills | Status: DC

## 2017-06-30 MED ORDER — CYANOCOBALAMIN 1000 MCG/ML IJ SOLN
1000.0000 ug | Freq: Once | INTRAMUSCULAR | Status: AC
  Administered 2017-06-30: 1000 ug via INTRAMUSCULAR

## 2017-06-30 NOTE — Progress Notes (Signed)
Subjective:  Timothy Gallagher is a 81 y.o. year old very pleasant male patient who presents for/with See problem oriented charting ROS- blurry vision left eye. No chest pain or shortness of breath. No headache or blurry vision.   Past Medical History-  Patient Active Problem List   Diagnosis Date Noted  . Detached retina, left 06/30/2017    Priority: High  . AAA (abdominal aortic aneurysm) ([place]) 05/12/2017    Priority: High  . CAD (coronary artery disease) of artery bypass graft 05/19/2014    Priority: High  . BPPV (benign paroxysmal positional vertigo) 09/01/2016    Priority: Medium  . Insomnia 11/22/2014    Priority: Medium  . Anemia 07/20/2014    Priority: Medium  . BPH (benign prostatic hyperplasia) 12/05/2013    Priority: Medium  . Hyperlipidemia 03/13/2008    Priority: Medium  . B12 deficiency 06/07/2007    Priority: Medium  . Essential hypertension 04/09/2007    Priority: Medium  . Low back pain 02/06/2017    Priority: Low  . Internal and external bleeding hemorrhoids 01/22/2015    Priority: Low  . Palpitations 02/11/2013    Priority: Low  . OSTEOARTHRITIS, WRIST, RIGHT 08/05/2010    Priority: Low  . ACNE ROSACEA 06/27/2009    Priority: Low  . ESOPHAGEAL STRICTURE 04/27/2009    Priority: Low  . ACTINIC KERATOSIS, HEAD 04/18/2009    Priority: Low  . NECK PAIN 09/14/2008    Priority: Low  . NEUROPATHY, IDIOPATHIC PERIPHERAL NEC 08/11/2007    Priority: Low  . Irritable bowel syndrome 08/11/2007    Priority: Low  . ALLERGIC RHINITIS 04/09/2007    Priority: Low  . Left sided abdominal pain 04/15/2017  . Anal fissure 04/07/2016    Medications- reviewed and updated Current Outpatient Prescriptions  Medication Sig Dispense Refill  . acetaminophen (TYLENOL) 500 MG tablet Take 500 mg by mouth daily as needed (for pain). Reported on 04/11/2016    . aspirin 81 MG EC tablet Take 1 tablet (81 mg total) by mouth daily.    . cetirizine (ZYRTEC) 10 MG tablet Take 10 mg  by mouth daily as needed for allergies.     . fluticasone (FLONASE) 50 MCG/ACT nasal spray USE 1 TO 2 SPRAYS IN EACH NOSTRIL DAILY AS NEEDED FOR ALLERGIES 16 g 3  . methylPREDNISolone (MEDROL DOSEPAK) 4 MG TBPK tablet 6 day dose pack - take as directed 21 tablet 0  . metoprolol succinate (TOPROL-XL) 25 MG 24 hr tablet Take 0.5 tablets (12.5 mg total) by mouth daily. 46 tablet 3  . Multiple Vitamins-Minerals (CENTRUM SILVER PO) Take 1 tablet by mouth daily.    [name] Kitchen ofloxacin (OCUFLOX) 0.3 % ophthalmic solution INT 1 GTT INTO OS QID  0  . prednisoLONE acetate (PRED FORTE) 1 % ophthalmic suspension INT 1 GTT INTO OS QID  0  . PRESCRIPTION MEDICATION VIT B-12 Take one (1) injection into the muscle every four (4) weeks.    . tamsulosin (FLOMAX) 0.4 MG CAPS capsule Take 0.4 mg by mouth at bedtime.  6  . valsartan (DIOVAN) 40 MG tablet Take 1 tablet (40 mg total) by mouth daily. 90 tablet 3  . zolpidem (AMBIEN) 5 MG tablet TAKE 1 TABLET BY MOUTH AT BEDTIME AS NEEDED FOR SLEEP 30 tablet 5  . atorvastatin (LIPITOR) 20 MG tablet Take 1 tablet (20 mg total) by mouth daily. 90 tablet 3   No current facility-administered medications for this visit.     Objective: BP 138/76 (BP Location:  Left Arm, Patient Position: Sitting, Cuff Size: Large)   Pulse 67   Temp 98 F (36.7 C) (Oral)   Ht 6\' 1"  (1.854 m)   Wt 197 lb 9.6 oz (89.6 kg)   SpO2 95%   BMI 26.07 kg/m  Gen: NAD, resting comfortably Bruising under left eye after recent detached retina CV: RRR no murmurs rubs or gallops Lungs: CTAB no crackles, wheeze, rhonchi Abdomen: soft/nontender/nondistended/normal bowel sounds. No rebound or guarding.  Ext: no edema Skin: warm, dry, no rash  Assessment/Plan:  Low vitamin B12 level - Plan: cyanocobalamin ((VITAMIN B-12)) injection 1,000 mcg  Essential hypertension S: controlled on valsartan 40mg  (did not take today) in the past in addition to metoprolol half tablet of 25mg  pill.  BP Readings from  Last 3 Encounters:  06/30/17 138/76  05/05/17 124/70  04/22/17 136/74  A/P: We discussed blood pressure goal of <140/90. Continue metoprolol. Stop valsartan . Start irbesartan half tablet of 75mg  tablet.   BPH (benign prostatic hyperplasia) Tolerating flomax- orthostatic symptoms have been better recently   Meds ordered this encounter  Medications  . ofloxacin (OCUFLOX) 0.3 % ophthalmic solution    Sig: INT 1 GTT INTO OS QID    Refill:  0  . prednisoLONE acetate (PRED FORTE) 1 % ophthalmic suspension    Sig: INT 1 GTT INTO OS QID    Refill:  0  . cyanocobalamin ((VITAMIN B-12)) injection 1,000 mcg  . irbesartan (AVAPRO) 75 MG tablet    Sig: Take 0.5 tablets (37.5 mg total) by mouth daily.    Dispense:  46 tablet    Refill:  3    Return precautions advised.  [name], MD

## 2017-06-30 NOTE — Assessment & Plan Note (Signed)
Tolerating flomax- orthostatic symptoms have been better recently

## 2017-06-30 NOTE — Patient Instructions (Signed)
Stop valsartan. Take half tablet of irbesartan 75mg  daily.   No other changes today

## 2017-06-30 NOTE — Assessment & Plan Note (Signed)
S: controlled on valsartan 40mg  in the past in addition to metoprolol half tablet of 25mg  pill.  BP Readings from Last 3 Encounters:  06/30/17 138/76  05/05/17 124/70  04/22/17 136/74  A/P: We discussed blood pressure goal of <140/90. Continue metoprolol. Stop valsartan . Start irbesartan half tablet of 75mg  tablet.

## 2017-06-30 NOTE — Telephone Encounter (Signed)
Pt said he can only get zolpidem # 90 to last 1 yrs per pharm. Pt would like something else call to [place]

## 2017-07-01 ENCOUNTER — Telehealth: Payer: Self-pay | Admitting: Family Medicine

## 2017-07-01 ENCOUNTER — Telehealth: Payer: Self-pay

## 2017-07-01 NOTE — Telephone Encounter (Signed)
Used to be on temazepam and insurance stopped covering- do they happen to be covering this again?

## 2017-07-01 NOTE — Telephone Encounter (Signed)
Patient Name: Timothy Gallagher  DOB: Mar 25, 1936    Initial Comment Caller says his BP has been really low 99/56 and has been feeling dizzy.    Nurse Assessment  Nurse: Tawanna Solo, RN, Vaughan Basta Date/Time (Eastern Time): 07/01/2017 4:12:35 PM  Confirm and document reason for call. If symptomatic, describe symptoms. ---Caller says his BP has been really low 99/56 and has been feeling dizzy. It had gotten as low as 85/57. Current bp is 123/69.  Does the patient have any new or worsening symptoms? ---Yes  Will a triage be completed? ---Yes  Related visit to physician within the last 2 weeks? ---No  Does the PT have any chronic conditions? (i.e. diabetes, asthma, etc.) ---Yes  List chronic conditions. ---HTN takes irbesartan, metoprol CAD  Is this a behavioral health or substance abuse call? ---No     Guidelines    Guideline Title Affirmed Question Affirmed Notes  Low Blood Pressure Brief (now gone) dizziness or lightheadedness after standing up or eating    Final Disposition User   See PCP When Office is Open (within 3 days) Tawanna Solo, RN, [employer]    Comments  Returned call to the pt Irbesartan 75mg  1/2 po qd (started this med yesterday). He also takes Metoprolol 25mg , 1/2 tab po qd. HR has been 77-82. States his SBP was 86 this am and he has felt dizzy all day. States he has not taken any of his BP meds today, does not take diuretics, antianxiety meds, or pain meds. States he feels better now as his SBP is 125 currently. Advised to lay down with feet elevated above his heart for a while. Advised if he starts feeling dizzy again when he gets up, he is to lay flat again w/feet above heart, and either call MDO back or have someone take him straight to ER. Advised to hold BP meds until seen by MD tomorrow. Inst given based on RN clinical knowledge. Appt made with Dr [name] for Thursday, 07/02/17 at 1500. Pt aware/agreeable.   Referrals  REFERRED TO PCP OFFICE  REFERRED TO PCP OFFICE    Disagree/Comply: Comply

## 2017-07-01 NOTE — Telephone Encounter (Signed)
Have him stop the irbesartan and record BP each morning for next 5 days then call and give [place] a report next week

## 2017-07-01 NOTE — Telephone Encounter (Signed)
Patient called to report that his BP is still very low and he is dizzy. His reading this morning is 99/56. He states that this has been going on for several days and was discussed at [place] yesterday. He has stopped Valsartan and took his first tab of Irbesartan this morning. He would like to know what you recommend.  [name] - Please advise. Thanks!

## 2017-07-02 ENCOUNTER — Ambulatory Visit: Payer: Self-pay | Admitting: Family Medicine

## 2017-07-02 NOTE — Telephone Encounter (Signed)
Thanks for helping [place] with this when it comes through Timothy Gallagher

## 2017-07-02 NOTE — Telephone Encounter (Signed)
Appt scheduled 07/02/17 @ 3 PM with [name].

## 2017-07-02 NOTE — Telephone Encounter (Signed)
Pt state that the insurance company will approve the Rx zolpidem they are in need of a approval from [name] they will be faxing over a form.

## 2017-07-02 NOTE — Telephone Encounter (Signed)
Spoke with patient who stated that he wasn't sure what his insurance would cover. I advised he call them to see and then call us back to let us know. He stated if he couldn't get anything he was fine with that also.

## 2017-07-02 NOTE — Telephone Encounter (Signed)
Spoke with pt and advised. He states that Irbesartan he picked up was 75mg  and he thought you had said he was to be on 50mg . He reports that BP first thing this morning was 145/92 then about an hour later it was 113/77. He has not taken the Irbesartan in 2 days. He will continue to hold and take BP daily. Asked pt to call on Monday with update and BP readings. Also advised him that if BP remains elevated early in the mornings to call office sooner for recommendations. Pt had appt scheduled for this afternoon and decided to cancel that and follow recommendations given. Nothing further needed at this time.   [name] - FYI. Thanks!

## 2017-07-03 ENCOUNTER — Telehealth: Payer: Self-pay | Admitting: Family Medicine

## 2017-07-03 NOTE — Telephone Encounter (Signed)
PA forms faxed back to insurance.

## 2017-07-03 NOTE — Telephone Encounter (Signed)
Spoke to patient who stated he had called [employer] and that he has already picked up his prescription.

## 2017-07-03 NOTE — Telephone Encounter (Signed)
[employer] is calling stating that the Rx zolpidem 5 MG was approved as of yesterday 06/1617 for a year.

## 2017-07-06 NOTE — Telephone Encounter (Signed)
Pt calling this am to report his Bp readings as instructed:  Today: bp  94/62  Hr 99 Sunday 8/19  7:20 am:  bp  102/70  Hr 108 Sunday night  bp 88/63   Hr 120 Sat 8/18 am  bp  112/69  Hr 122 Sat evening bp 112/76  Hr 95 Friday am  bp 120/78  Hr 91 Friday  bp 97/64   Hr 110 Thurs am  8:45 bp  116/75  Hr 78 Thurs mid am  bp 145/92  Hr 86 Thursday  2 pm bp 105/66  Hr 79  Thurs nite bp 113/77  Hr 98

## 2017-07-07 NOTE — Telephone Encounter (Signed)
These are his numbers on no blood pressure medicine correct?

## 2017-07-07 NOTE — Telephone Encounter (Signed)
Other than metoprolol 1/2 tablet daily I mean

## 2017-07-08 ENCOUNTER — Telehealth: Payer: Self-pay

## 2017-07-08 NOTE — Telephone Encounter (Signed)
Patient called with concern about his heart rate. He states that it has been elevated some, especially first thing in the mornings when he gets out of bed. He does note that d/t a detached retina he has had to sleep sitting up and this has been very uncomfortable for him and is causing increased hip pain. This is exacerbated when he gets out of bed in the morning. He was curious if the spike in his heart rate could be related to his pain. I advised pt that this could definitely be a correlation. Asked pt to monitor heart rate in relation to his pain to see if there was a direct cause and effect. Advised pt that if his heart rate continues to rise he can call our office or Dr. [name] for advice. Pt states that his BP has been doing very well since the medication change. Pt voiced understanding to all. Nothing further needed at this time.

## 2017-07-09 ENCOUNTER — Ambulatory Visit: Payer: Medicare Other | Admitting: Internal Medicine

## 2017-07-13 NOTE — Telephone Encounter (Signed)
Tried to call patient twice and got a busy signal both times

## 2017-07-13 NOTE — Telephone Encounter (Signed)
Spoke with patient who states that these pressures are with him not taking anything

## 2017-07-13 NOTE — Telephone Encounter (Signed)
He should remain off blood pressure medicine for now- keep recording and see me in 3 weeks so we can recheck in person.

## 2017-07-14 ENCOUNTER — Encounter: Payer: Self-pay | Admitting: Family Medicine

## 2017-07-14 ENCOUNTER — Ambulatory Visit (INDEPENDENT_AMBULATORY_CARE_PROVIDER_SITE_OTHER): Payer: Medicare Other | Admitting: Family Medicine

## 2017-07-14 VITALS — BP 136/84 | HR 78 | Temp 98.0°F | Wt 197.0 lb

## 2017-07-14 DIAGNOSIS — I10 Essential (primary) hypertension: Secondary | ICD-10-CM | POA: Diagnosis not present

## 2017-07-14 DIAGNOSIS — N401 Enlarged prostate with lower urinary tract symptoms: Secondary | ICD-10-CM | POA: Diagnosis not present

## 2017-07-14 DIAGNOSIS — R351 Nocturia: Secondary | ICD-10-CM

## 2017-07-14 NOTE — Progress Notes (Signed)
Subjective:  Timothy Gallagher is a 81 y.o. year old very pleasant male patient who presents for/with See problem oriented charting ROS- lightheaded each AM. BP was running low until stopping irbesartan. Lightheadedness persists despite being off med. No chest pain or shortness of breath. No headache or blurry vision.    Past Medical History-  Patient Active Problem List   Diagnosis Date Noted  . Detached retina, left 06/30/2017    Priority: High  . AAA (abdominal aortic aneurysm) ([place]) 05/12/2017    Priority: High  . CAD (coronary artery disease) of artery bypass graft 05/19/2014    Priority: High  . BPPV (benign paroxysmal positional vertigo) 09/01/2016    Priority: Medium  . Insomnia 11/22/2014    Priority: Medium  . Anemia 07/20/2014    Priority: Medium  . BPH (benign prostatic hyperplasia) 12/05/2013    Priority: Medium  . Hyperlipidemia 03/13/2008    Priority: Medium  . B12 deficiency 06/07/2007    Priority: Medium  . Essential hypertension 04/09/2007    Priority: Medium  . Low back pain 02/06/2017    Priority: Low  . Internal and external bleeding hemorrhoids 01/22/2015    Priority: Low  . Palpitations 02/11/2013    Priority: Low  . OSTEOARTHRITIS, WRIST, RIGHT 08/05/2010    Priority: Low  . ACNE ROSACEA 06/27/2009    Priority: Low  . ESOPHAGEAL STRICTURE 04/27/2009    Priority: Low  . ACTINIC KERATOSIS, HEAD 04/18/2009    Priority: Low  . NECK PAIN 09/14/2008    Priority: Low  . NEUROPATHY, IDIOPATHIC PERIPHERAL NEC 08/11/2007    Priority: Low  . Irritable bowel syndrome 08/11/2007    Priority: Low  . ALLERGIC RHINITIS 04/09/2007    Priority: Low  . Left sided abdominal pain 04/15/2017  . Anal fissure 04/07/2016    Medications- reviewed and updated Current Outpatient Prescriptions  Medication Sig Dispense Refill  . acetaminophen (TYLENOL) 500 MG tablet Take 500 mg by mouth daily as needed (for pain). Reported on 04/11/2016    . aspirin 81 MG EC tablet  Take 1 tablet (81 mg total) by mouth daily.    . cetirizine (ZYRTEC) 10 MG tablet Take 10 mg by mouth daily as needed for allergies.     . fluticasone (FLONASE) 50 MCG/ACT nasal spray USE 1 TO 2 SPRAYS IN EACH NOSTRIL DAILY AS NEEDED FOR ALLERGIES 16 g 3  . irbesartan (AVAPRO) 75 MG tablet Take 0.5 tablets (37.5 mg total) by mouth daily. 46 tablet 3  . metoprolol succinate (TOPROL-XL) 25 MG 24 hr tablet Take 0.5 tablets (12.5 mg total) by mouth daily. 46 tablet 3  . Multiple Vitamins-Minerals (CENTRUM SILVER PO) Take 1 tablet by mouth daily.    [name] Kitchen ofloxacin (OCUFLOX) 0.3 % ophthalmic solution INT 1 GTT INTO OS QID  0  . prednisoLONE acetate (PRED FORTE) 1 % ophthalmic suspension INT 1 GTT INTO OS QID  0  . PRESCRIPTION MEDICATION VIT B-12 Take one (1) injection into the muscle every four (4) weeks.    . tamsulosin (FLOMAX) 0.4 MG CAPS capsule Take 0.4 mg by mouth at bedtime.  6  . zolpidem (AMBIEN) 5 MG tablet TAKE 1 TABLET BY MOUTH AT BEDTIME AS NEEDED FOR SLEEP 30 tablet 5  . atorvastatin (LIPITOR) 20 MG tablet Take 1 tablet (20 mg total) by mouth daily. 90 tablet 3   No current facility-administered medications for this visit.     Objective: BP 136/84   Pulse 78   Temp 98 F (  36.7 C) (Oral)   Wt 197 lb (89.4 kg)   SpO2 97%   BMI 25.99 kg/m  Gen: NAD, resting comfortably CV: RRR no murmurs rubs or gallops Lungs: CTAB no crackles, wheeze, rhonchi Ext: no edema Skin: warm, dry Not lightheaded with standing in office today  Assessment/Plan:  Essential hypertension S: controlled on no rx- had been on metoprolol and irbesartan in past.  Continues to be Lightheaded each morning. Orthostatic type symptoms.   Home readings since last Monday have been 102-138/68-85. Initial BP here at 917 systolic BP Readings from Last 3 Encounters:  07/14/17 136/84  06/30/17 138/76  05/05/17 124/70  A/P: We discussed blood pressure goal of <140/90 with his heart disease. Stay off irbesartan. Start  metoprolol back at 12.5mg  extended release with CAD and AAA history. Instead of taking this off- advised stopping flomax which is most likely cause of lightheaded symptoms  BPH (benign prostatic hyperplasia) flomax in the evening. Orthostatic each AM. Discussed trial off to see if improvement occurs. Potentially consider finasteride  Future Appointments Date Time Provider [place]  07/31/2017 11:30 AM [facility]   Return precautions advised.  [name], MD

## 2017-07-14 NOTE — Patient Instructions (Signed)
Stay off irbesartan  Stop flomax/tamsulosin (bladder pill). This medicine tends to cause lightheadedness with standing and is likely your main issue.   Start metoprolol 12.5mg  extended release back (half of 25mg  tablet)  When you see [place] back in September- ask if they can give you a flu shot. Also drop off some more blood pressures for [place] to review

## 2017-07-15 NOTE — Telephone Encounter (Signed)
Tried to call patient but received a busy signal both times

## 2017-07-19 NOTE — Assessment & Plan Note (Addendum)
flomax in the evening. Orthostatic each AM. Discussed trial off to see if improvement occurs. Potentially consider finasteride

## 2017-07-19 NOTE — Assessment & Plan Note (Signed)
S: controlled on no rx- had been on metoprolol and irbesartan in past.  Continues to be Lightheaded each morning. Orthostatic type symptoms.   Home readings since last Monday have been 102-138/68-85. Initial BP here at 175 systolic BP Readings from Last 3 Encounters:  07/14/17 136/84  06/30/17 138/76  05/05/17 124/70  A/P: We discussed blood pressure goal of <140/90 with his heart disease. Stay off irbesartan. Start metoprolol back at 12.5mg  extended release with CAD and AAA history. Instead of taking this off- advised stopping flomax which is most likely cause of lightheaded symptoms

## 2017-07-22 NOTE — Telephone Encounter (Signed)
Left a very detailed message on voicemail to call office.

## 2017-07-24 NOTE — Telephone Encounter (Signed)
Timothy Gallagher spoke with patient yesterday and informed him to stay off of BP medication until he comes in for follow up

## 2017-07-27 ENCOUNTER — Telehealth: Payer: Self-pay | Admitting: Family Medicine

## 2017-07-27 NOTE — Telephone Encounter (Signed)
Pt states he was up and down all night long going to the bathroom.  Pt states he gets confused and disoriented.Wants to know if he should go back on the  Medication  tamsulosin (FLOMAX) 0.4 MG CAPS capsule [103890]  Even though it made him dizzy.  Pt states he cannot spend many nights like last night up and down all night. Lease advise

## 2017-07-28 NOTE — Telephone Encounter (Signed)
Called an spoke with patient, patient states that he has an appointment Friday he believes he has the flu and will hold off on the medication at this time and discuss at appointment Friday. Nothing further needed at this time.

## 2017-07-28 NOTE — Telephone Encounter (Signed)
He may restart but he has to realize this is going to lead him to having dizzy spells.

## 2017-07-29 ENCOUNTER — Ambulatory Visit: Payer: Medicare Other | Admitting: Family Medicine

## 2017-07-29 ENCOUNTER — Ambulatory Visit (INDEPENDENT_AMBULATORY_CARE_PROVIDER_SITE_OTHER): Payer: Medicare Other | Admitting: Family Medicine

## 2017-07-29 ENCOUNTER — Encounter: Payer: Self-pay | Admitting: Family Medicine

## 2017-07-29 VITALS — BP 98/64 | HR 98 | Temp 98.4°F | Ht 73.0 in | Wt 194.0 lb

## 2017-07-29 DIAGNOSIS — R319 Hematuria, unspecified: Secondary | ICD-10-CM | POA: Diagnosis not present

## 2017-07-29 DIAGNOSIS — N390 Urinary tract infection, site not specified: Secondary | ICD-10-CM | POA: Diagnosis not present

## 2017-07-29 DIAGNOSIS — R350 Frequency of micturition: Secondary | ICD-10-CM | POA: Diagnosis not present

## 2017-07-29 LAB — POC URINALSYSI DIPSTICK (AUTOMATED)
Glucose, UA: NEGATIVE
Ketones, UA: NEGATIVE
Nitrite, UA: POSITIVE
Spec Grav, UA: >=1.03 — AB (ref 1.010–1.025)
Urobilinogen, UA: 0.2 E.U./dL
pH, UA: 5 (ref 5.0–8.0)

## 2017-07-29 MED ORDER — CEPHALEXIN 500 MG PO CAPS: 500.0000 mg | ORAL_CAPSULE | Freq: Three times a day (TID) | ORAL | 0 refills | Status: DC

## 2017-07-29 MED ORDER — CEFTRIAXONE SODIUM 1 G IJ SOLR
1.0000 g | Freq: Once | INTRAMUSCULAR | Status: AC
  Administered 2017-07-29: 1 g via INTRAMUSCULAR

## 2017-07-29 NOTE — Progress Notes (Signed)
Subjective:  Timothy Gallagher is a 81 y.o. year old very pleasant male patient who presents for/with See problem oriented charting ROS- subjective warmth and chills. No nausea or vomiting. Feels confused at night when not sleeping well- no issues in the day   Past Medical History-  Patient Active Problem List   Diagnosis Date Noted  . Detached retina, left 06/30/2017    Priority: High  . AAA (abdominal aortic aneurysm) ([place]) 05/12/2017    Priority: High  . CAD (coronary artery disease) of artery bypass graft 05/19/2014    Priority: High  . BPPV (benign paroxysmal positional vertigo) 09/01/2016    Priority: Medium  . Insomnia 11/22/2014    Priority: Medium  . Anemia 07/20/2014    Priority: Medium  . BPH (benign prostatic hyperplasia) 12/05/2013    Priority: Medium  . Hyperlipidemia 03/13/2008    Priority: Medium  . B12 deficiency 06/07/2007    Priority: Medium  . Essential hypertension 04/09/2007    Priority: Medium  . Low back pain 02/06/2017    Priority: Low  . Internal and external bleeding hemorrhoids 01/22/2015    Priority: Low  . Palpitations 02/11/2013    Priority: Low  . OSTEOARTHRITIS, WRIST, RIGHT 08/05/2010    Priority: Low  . ACNE ROSACEA 06/27/2009    Priority: Low  . ESOPHAGEAL STRICTURE 04/27/2009    Priority: Low  . ACTINIC KERATOSIS, HEAD 04/18/2009    Priority: Low  . NECK PAIN 09/14/2008    Priority: Low  . NEUROPATHY, IDIOPATHIC PERIPHERAL NEC 08/11/2007    Priority: Low  . Irritable bowel syndrome 08/11/2007    Priority: Low  . ALLERGIC RHINITIS 04/09/2007    Priority: Low  . Left sided abdominal pain 04/15/2017  . Anal fissure 04/07/2016    Medications- reviewed and updated Current Outpatient Prescriptions  Medication Sig Dispense Refill  . acetaminophen (TYLENOL) 500 MG tablet Take 500 mg by mouth daily as needed (for pain). Reported on 04/11/2016    . aspirin 81 MG EC tablet Take 1 tablet (81 mg total) by mouth daily.    . cetirizine  (ZYRTEC) 10 MG tablet Take 10 mg by mouth daily as needed for allergies.     . fluticasone (FLONASE) 50 MCG/ACT nasal spray USE 1 TO 2 SPRAYS IN EACH NOSTRIL DAILY AS NEEDED FOR ALLERGIES 16 g 3  . metoprolol succinate (TOPROL-XL) 25 MG 24 hr tablet Take 0.5 tablets (12.5 mg total) by mouth daily. 46 tablet 3  . Multiple Vitamins-Minerals (CENTRUM SILVER PO) Take 1 tablet by mouth daily.    [name] Kitchen ofloxacin (OCUFLOX) 0.3 % ophthalmic solution INT 1 GTT INTO OS QID  0  . prednisoLONE acetate (PRED FORTE) 1 % ophthalmic suspension INT 1 GTT INTO OS QID  0  . PRESCRIPTION MEDICATION VIT B-12 Take one (1) injection into the muscle every four (4) weeks.    [name] Kitchen zolpidem (AMBIEN) 5 MG tablet TAKE 1 TABLET BY MOUTH AT BEDTIME AS NEEDED FOR SLEEP 30 tablet 5  . atorvastatin (LIPITOR) 20 MG tablet Take 1 tablet (20 mg total) by mouth daily. 90 tablet 3   No current facility-administered medications for this visit.     Objective: BP 98/64 (BP Location: Left Arm, Patient Position: Sitting, Cuff Size: Large)   Pulse 98   Temp 98.4 F (36.9 C) (Oral)   Ht 6\' 1"  (1.854 m)   Wt 194 lb (88 kg)   SpO2 93%   BMI 25.60 kg/m  Gen: NAD, resting comfortably, appears fatigued, some  lightheadedness with standing CV: RRR no murmurs rubs or gallops Lungs: CTAB no crackles, wheeze, rhonchi No suprapubic pain Ext: no edema Skin: warm, dry Neuro: grossly normal, moves all extremities Rectal: normal tone, diffusely enlarged prostate, no masses or tenderness  Results for orders placed or performed in visit on 07/29/17 (from the past 24 hour(s))  POCT Urinalysis Dipstick (Automated)     Status: Abnormal   Collection Time: 07/29/17  9:23 AM  Result Value Ref Range   Color, UA Dark yellow    Clarity, UA clear    Glucose, UA neg    Bilirubin, UA 1+    Ketones, UA neg    Spec Grav, UA >=1.030 (A) 1.010 - 1.025   Blood, UA 3+    pH, UA 5.0 5.0 - 8.0   Protein, UA 3+    Urobilinogen, UA 0.2 0.2 or 1.0 E.U./dL    Nitrite, UA positive    Leukocytes, UA Small (1+) (A) Negative   Assessment/Plan:  Urinary tract infection with hematuria, site unspecified - Plan: cefTRIAXone (ROCEPHIN) injection 1 g  Frequent urination - Plan: POCT Urinalysis Dipstick (Automated), Urine Culture S:  increased urination started Monday of this week had been off flomax since 07/14/17 to see if we could reduce orthostasis. Burns when he pees as well. Goes frequently but smaller volume than typical. Can have urgency then only get small amount.   He has had issues feeling confused and disoriented at night- ok in the day but does feel very fatigued. Up all night peeing and not getting a good nights rest. Had incontinence. Having chills. Subjective fever. Advised him to restart the flomax - has not started this back yet.  A/P: UTI based off UA and symptoms. Treat with ceftriaxone (some risk with PCN rash allergy) and keflex x 10 days. Get culture. Having some systemic symptoms and given strict return precautions. No prostatitis on exam. Likely exacerbated by BPH off flomax- will resume flomax despite lightheadedness risk. High risk patient due to age and systemic symptoms.  Patient Instructions  You have had a rash on the cousin to the medicines we are giving you but I hope this does not occur with the medication we are giving you. I am giving you an injection today (ceftriaxone) followed by 10 days of keflex.   Will get urine culture to make sure you are on the right antibiotic. Would usually use ciprofloxacin but you have an allergy of swelling to this listed.   This does look like a urinary infection  Hold metoprolol until your dizziness resolves (leave on your med list)   If you have fever above 100.5 seek care in emergency room immediately. Contact us immediately if symptoms are not improving. We have an appointment in 8 days and we can recheck to see how you are doing  Future Appointments Date Time Provider [place]   07/31/2017 11:30 AM [facility]  08/06/2017 11:15 AM [name], MD [facility]  will check in 8 days from now or sooner if needed.   Orders Placed This Encounter  Procedures  . Urine Culture  . POCT Urinalysis Dipstick (Automated)   Return precautions advised.  [name], MD

## 2017-07-29 NOTE — Patient Instructions (Addendum)
You have had a rash on the cousin to the medicines we are giving you but I hope this does not occur with the medication we are giving you. I am giving you an injection today (ceftriaxone) followed by 10 days of keflex.   Will get urine culture to make sure you are on the right antibiotic. Would usually use ciprofloxacin but you have an allergy of swelling to this listed.   This does look like a urinary infection  Hold metoprolol until your dizziness resolves (leave on your med list)   If you have fever above 100.5 seek care in emergency room immediately. Contact us immediately if symptoms are not improving. We have an appointment in 8 days and we can recheck to see how you are doing

## 2017-07-29 NOTE — Telephone Encounter (Signed)
Pt has appt today with Dr Yong Channel

## 2017-07-31 ENCOUNTER — Encounter (HOSPITAL_COMMUNITY): Payer: Self-pay | Admitting: Emergency Medicine

## 2017-07-31 ENCOUNTER — Encounter: Payer: Self-pay | Admitting: Family Medicine

## 2017-07-31 ENCOUNTER — Ambulatory Visit (INDEPENDENT_AMBULATORY_CARE_PROVIDER_SITE_OTHER): Payer: Medicare Other | Admitting: Family Medicine

## 2017-07-31 ENCOUNTER — Inpatient Hospital Stay (HOSPITAL_COMMUNITY)
Admission: EM | Admit: 2017-07-31 | Discharge: 2017-08-02 | DRG: 689 | Disposition: A | Discharge status: Home / Self Care | Payer: Medicare Other | Attending: Internal Medicine | Admitting: Internal Medicine

## 2017-07-31 ENCOUNTER — Ambulatory Visit: Payer: Medicare Other

## 2017-07-31 VITALS — BP 98/60 | HR 105 | Temp 98.4°F | Ht 73.0 in | Wt 194.0 lb

## 2017-07-31 DIAGNOSIS — H3322 Serous retinal detachment, left eye: Secondary | ICD-10-CM | POA: Diagnosis present

## 2017-07-31 DIAGNOSIS — I6789 Other cerebrovascular disease: Secondary | ICD-10-CM | POA: Diagnosis not present

## 2017-07-31 DIAGNOSIS — R6889 Other general symptoms and signs: Secondary | ICD-10-CM

## 2017-07-31 DIAGNOSIS — R251 Tremor, unspecified: Secondary | ICD-10-CM | POA: Diagnosis not present

## 2017-07-31 DIAGNOSIS — I2510 Atherosclerotic heart disease of native coronary artery without angina pectoris: Secondary | ICD-10-CM | POA: Diagnosis present

## 2017-07-31 DIAGNOSIS — Z87891 Personal history of nicotine dependence: Secondary | ICD-10-CM

## 2017-07-31 DIAGNOSIS — J309 Allergic rhinitis, unspecified: Secondary | ICD-10-CM | POA: Diagnosis not present

## 2017-07-31 DIAGNOSIS — B9620 Unspecified Escherichia coli [E. coli] as the cause of diseases classified elsewhere: Secondary | ICD-10-CM | POA: Diagnosis not present

## 2017-07-31 DIAGNOSIS — N390 Urinary tract infection, site not specified: Secondary | ICD-10-CM | POA: Diagnosis not present

## 2017-07-31 DIAGNOSIS — R0602 Shortness of breath: Secondary | ICD-10-CM | POA: Diagnosis not present

## 2017-07-31 DIAGNOSIS — R319 Hematuria, unspecified: Secondary | ICD-10-CM

## 2017-07-31 DIAGNOSIS — Z951 Presence of aortocoronary bypass graft: Secondary | ICD-10-CM | POA: Diagnosis not present

## 2017-07-31 DIAGNOSIS — Z880 Allergy status to penicillin: Secondary | ICD-10-CM

## 2017-07-31 DIAGNOSIS — I10 Essential (primary) hypertension: Secondary | ICD-10-CM | POA: Diagnosis not present

## 2017-07-31 DIAGNOSIS — K589 Irritable bowel syndrome without diarrhea: Secondary | ICD-10-CM | POA: Diagnosis not present

## 2017-07-31 DIAGNOSIS — Z79899 Other long term (current) drug therapy: Secondary | ICD-10-CM

## 2017-07-31 DIAGNOSIS — E538 Deficiency of other specified B group vitamins: Secondary | ICD-10-CM | POA: Diagnosis present

## 2017-07-31 DIAGNOSIS — I25810 Atherosclerosis of coronary artery bypass graft(s) without angina pectoris: Secondary | ICD-10-CM | POA: Diagnosis not present

## 2017-07-31 DIAGNOSIS — A419 Sepsis, unspecified organism: Secondary | ICD-10-CM | POA: Diagnosis present

## 2017-07-31 DIAGNOSIS — N3289 Other specified disorders of bladder: Secondary | ICD-10-CM | POA: Diagnosis not present

## 2017-07-31 DIAGNOSIS — D696 Thrombocytopenia, unspecified: Secondary | ICD-10-CM | POA: Diagnosis not present

## 2017-07-31 DIAGNOSIS — I714 Abdominal aortic aneurysm, without rupture: Secondary | ICD-10-CM | POA: Diagnosis present

## 2017-07-31 DIAGNOSIS — E785 Hyperlipidemia, unspecified: Secondary | ICD-10-CM | POA: Diagnosis not present

## 2017-07-31 DIAGNOSIS — E784 Other hyperlipidemia: Secondary | ICD-10-CM

## 2017-07-31 DIAGNOSIS — I959 Hypotension, unspecified: Secondary | ICD-10-CM | POA: Diagnosis present

## 2017-07-31 DIAGNOSIS — Z7982 Long term (current) use of aspirin: Secondary | ICD-10-CM

## 2017-07-31 DIAGNOSIS — N10 Acute tubulo-interstitial nephritis: Secondary | ICD-10-CM | POA: Diagnosis not present

## 2017-07-31 DIAGNOSIS — J320 Chronic maxillary sinusitis: Secondary | ICD-10-CM | POA: Diagnosis present

## 2017-07-31 DIAGNOSIS — Z881 Allergy status to other antibiotic agents status: Secondary | ICD-10-CM

## 2017-07-31 DIAGNOSIS — G92 Toxic encephalopathy: Secondary | ICD-10-CM | POA: Diagnosis present

## 2017-07-31 DIAGNOSIS — R000 Tachycardia, unspecified: Secondary | ICD-10-CM

## 2017-07-31 DIAGNOSIS — Z888 Allergy status to other drugs, medicaments and biological substances status: Secondary | ICD-10-CM

## 2017-07-31 DIAGNOSIS — Z981 Arthrodesis status: Secondary | ICD-10-CM

## 2017-07-31 DIAGNOSIS — R109 Unspecified abdominal pain: Secondary | ICD-10-CM

## 2017-07-31 DIAGNOSIS — Z8249 Family history of ischemic heart disease and other diseases of the circulatory system: Secondary | ICD-10-CM

## 2017-07-31 DIAGNOSIS — N400 Enlarged prostate without lower urinary tract symptoms: Secondary | ICD-10-CM | POA: Diagnosis present

## 2017-07-31 DIAGNOSIS — Z885 Allergy status to narcotic agent status: Secondary | ICD-10-CM

## 2017-07-31 LAB — URINALYSIS, ROUTINE W REFLEX MICROSCOPIC
Bilirubin Urine: NEGATIVE
Glucose, UA: NEGATIVE mg/dL
Ketones, ur: NEGATIVE mg/dL
Nitrite: NEGATIVE
Protein, ur: >=300 mg/dL — AB
Specific Gravity, Urine: 1.022 (ref 1.005–1.030)
Squamous Epithelial / LPF: NONE SEEN
pH: 5 (ref 5.0–8.0)

## 2017-07-31 LAB — BASIC METABOLIC PANEL
Anion gap: 9 (ref 5–15)
BUN: 29 mg/dL — ABNORMAL HIGH (ref 6–20)
CO2: 21 mmol/L — ABNORMAL LOW (ref 22–32)
Calcium: 8.3 mg/dL — ABNORMAL LOW (ref 8.9–10.3)
Chloride: 106 mmol/L (ref 101–111)
Creatinine, Ser: 1.06 mg/dL (ref 0.61–1.24)
GFR calc Af Amer: >60 mL/min (ref >60)
GFR calc non Af Amer: >60 mL/min (ref >60)
Glucose, Bld: 141 mg/dL — ABNORMAL HIGH (ref 65–99)
Potassium: 4 mmol/L (ref 3.5–5.1)
Sodium: 136 mmol/L (ref 135–145)

## 2017-07-31 LAB — CBC WITH DIFFERENTIAL/PLATELET
Basophils Absolute: 0 10*3/uL (ref 0.0–0.1)
Basophils Relative: 0 %
Eosinophils Absolute: 0 10*3/uL (ref 0.0–0.7)
Eosinophils Relative: 0 %
HCT: 34.6 % — ABNORMAL LOW (ref 39.0–52.0)
Hemoglobin: 11.7 g/dL — ABNORMAL LOW (ref 13.0–17.0)
Lymphocytes Relative: 8 %
Lymphs Abs: 0.5 10*3/uL — ABNORMAL LOW (ref 0.7–4.0)
MCH: 27.5 pg (ref 26.0–34.0)
MCHC: 33.8 g/dL (ref 30.0–36.0)
MCV: 81.4 fL (ref 78.0–100.0)
Monocytes Absolute: 0.5 10*3/uL (ref 0.1–1.0)
Monocytes Relative: 8 %
Neutro Abs: 5 10*3/uL (ref 1.7–7.7)
Neutrophils Relative %: 84 %
Platelets: 97 10*3/uL — ABNORMAL LOW (ref 150–400)
RBC: 4.25 MIL/uL (ref 4.22–5.81)
RDW: 14.9 % (ref 11.5–15.5)
WBC: 6 10*3/uL (ref 4.0–10.5)

## 2017-07-31 LAB — I-STAT CG4 LACTIC ACID, ED: Lactic Acid, Venous: 1.15 mmol/L (ref 0.5–1.9)

## 2017-07-31 MED ORDER — ACETAMINOPHEN 650 MG RE SUPP: 650.0000 mg | Freq: Four times a day (QID) | RECTAL | Status: DC | PRN

## 2017-07-31 MED ORDER — SODIUM CHLORIDE 0.9 % IV BOLUS (SEPSIS)
1000.0000 mL | Freq: Once | INTRAVENOUS | Status: AC
  Administered 2017-07-31: 1000 mL via INTRAVENOUS

## 2017-07-31 MED ORDER — DEXTROSE 5 % IV SOLN
2.0000 g | Freq: Once | INTRAVENOUS | Status: AC
  Administered 2017-07-31: 2 g via INTRAVENOUS
  Filled 2017-07-31: qty 2

## 2017-07-31 MED ORDER — ACETAMINOPHEN 325 MG PO TABS
650.0000 mg | ORAL_TABLET | Freq: Once | ORAL | Status: AC
  Administered 2017-07-31: 650 mg via ORAL
  Filled 2017-07-31: qty 2

## 2017-07-31 MED ORDER — ACETAMINOPHEN 325 MG PO TABS
650.0000 mg | ORAL_TABLET | Freq: Four times a day (QID) | ORAL | Status: DC | PRN
  Administered 2017-08-01: 650 mg via ORAL
  Filled 2017-07-31: qty 2

## 2017-07-31 MED ORDER — SODIUM CHLORIDE 0.9 % IV BOLUS (SEPSIS)
1000.0000 mL | Freq: Once | INTRAVENOUS | Status: AC
Start: 2017-07-31 — End: 2017-07-31
  Administered 2017-07-31: 1000 mL via INTRAVENOUS

## 2017-07-31 MED ORDER — ATORVASTATIN CALCIUM 20 MG PO TABS
20.0000 mg | ORAL_TABLET | Freq: Every day | ORAL | Status: DC
  Administered 2017-08-01 – 2017-08-02 (×2): 20 mg via ORAL
  Filled 2017-07-31 (×2): qty 1

## 2017-07-31 MED ORDER — DEXTROSE 5 % IV SOLN: 1.0000 g | INTRAVENOUS | Status: DC

## 2017-07-31 MED ORDER — SODIUM CHLORIDE 0.9 % IV SOLN
INTRAVENOUS | Status: DC
  Administered 2017-07-31 – 2017-08-01 (×2): via INTRAVENOUS

## 2017-07-31 MED ORDER — FLUTICASONE PROPIONATE 50 MCG/ACT NA SUSP
2.0000 | Freq: Every day | NASAL | Status: DC
  Filled 2017-07-31: qty 16

## 2017-07-31 MED ORDER — DEXTROSE 5 % IV SOLN
2.0000 g | INTRAVENOUS | Status: DC
  Administered 2017-08-01: 2 g via INTRAVENOUS
  Filled 2017-07-31 (×2): qty 2

## 2017-07-31 MED ORDER — ASPIRIN EC 81 MG PO TBEC
81.0000 mg | DELAYED_RELEASE_TABLET | Freq: Every day | ORAL | Status: DC
  Administered 2017-08-01 – 2017-08-02 (×2): 81 mg via ORAL
  Filled 2017-07-31 (×2): qty 1

## 2017-07-31 NOTE — ED Notes (Signed)
Bed: WA07 Expected date:  Expected time:  Means of arrival:  Comments: 81 yo AMS

## 2017-07-31 NOTE — Patient Instructions (Signed)
Will proceed by ambulance for further care.

## 2017-07-31 NOTE — ED Notes (Signed)
First blood culture sent to lab.

## 2017-07-31 NOTE — ED Provider Notes (Signed)
Medical screening examination/treatment/procedure(s) were conducted as a shared visit with non-physician practitioner(s) and myself.  I personally evaluated the patient during the encounter.   EKG Interpretation  Date/Time:  Friday July 31 2017 13:05:30 EDT Ventricular Rate:  104 PR Interval:    QRS Duration: 132 QT Interval:  366 QTC Calculation: 482 R Axis:   -59 Text Interpretation:  Sinus tachycardia Atrial premature complexes RBBB and LAFB Minimal ST elevation, lateral leads New since previous tracing Confirmed by Fredia Sorrow [phone]) on 08/02/2017 2:28:43 PM     Patient has been taking Keflex for urinary tract infection as an outpatient. Symptoms have not been improving. He developed chills nausea and some confusion. He was seen by PCP and referred due to episode of being hypotensive and febrile. Prior urine culture positive for Escherichia coli.  Patient is alert and appropriate at this time. He is cooperative.no respiratory distress.heart tachycardic but regular. Lungs clear. Abdomen soft without guarding. Skin warm and dry. Movements coordinated and symmetric.  Patient is identified to be febrile at 103 with tachycardia/tachypnea with source of infection. Sepsis protocol initiated.  CRITICAL CARE Performed by: Charlesetta Shanks   Total critical care time:30  minutes  Critical care time was exclusive of separately billable procedures and treating other patients.  Critical care was necessary to treat or prevent imminent or life-threatening deterioration.  Critical care was time spent personally by me on the following activities: development of treatment plan with patient and/or surrogate as well as nursing, discussions with consultants, evaluation of patient's response to treatment, examination of patient, obtaining history from patient or surrogate, ordering and performing treatments and interventions, ordering and review of laboratory studies, ordering and review of  radiographic studies, pulse oximetry and re-evaluation of patient's condition.   Charlesetta Shanks, MD 08/04/17 [phone]

## 2017-07-31 NOTE — ED Notes (Signed)
Patient made aware that urine sample is needed. Patient given urinal.

## 2017-07-31 NOTE — ED Notes (Signed)
Patient shaking in bed. Family expressing concern. Tylenol administered at 1617. Hospitalist made aware of patient's condition. Patient denies shortness of breath and chest pain. Vital signs stable.

## 2017-07-31 NOTE — ED Notes (Signed)
Per [place] PA, one IV is sufficient at this time.

## 2017-07-31 NOTE — ED Notes (Signed)
Family at bedside. 

## 2017-07-31 NOTE — ED Notes (Signed)
Patient given turkey sandwich

## 2017-07-31 NOTE — ED Notes (Signed)
PA at bedside.

## 2017-07-31 NOTE — ED Triage Notes (Addendum)
Per EMS, patient from [place], where he has been being treated for UTI. Acutely confused this morning, unable to recognize wife. Upon EMS arrival, patient A&Ox4. Patient c/o tremor to bilateral hands x4 days. Denies pain.  20g R hand  BP 146/83 HR 110 RR 18 O2 95% CBG 162

## 2017-07-31 NOTE — ED Notes (Signed)
ED TO INPATIENT HANDOFF REPORT  Name/Age/Gender [name] 81 y.o. male  Code Status Code Status History    Date Active Date Inactive Code Status Order ID Comments User Context   05/19/2014  2:14 PM 05/24/2014  1:45 PM Full Code [id number]  Rexene Alberts, MD Inpatient   05/12/2014 10:17 AM 05/12/2014  4:04 PM Full Code [id number]  Belva Crome, MD Inpatient      Home/SNF/Other Home  Chief Complaint AMS  Level of Care/Admitting Diagnosis ED Disposition    ED Disposition Condition [place]: [facility] [[id number]]  Level of Care: Telemetry [5]  Admit to tele based on following criteria: Complex arrhythmia (Bradycardia/Tachycardia)  Diagnosis: [facility] [[id number]]  Admitting Physician: Caren Griffins [phone]  Attending Physician: Caren Griffins [phone]  Estimated length of stay: past midnight tomorrow  Certification:: I certify this patient will need inpatient services for at least 2 midnights  PT Class (Do Not Modify): Inpatient [101]  PT Acc Code (Do Not Modify): Private [1]       Medical History Past Medical History:  Diagnosis Date  . ACNE ROSACEA 06/27/2009  . ACTINIC KERATOSIS, HEAD 04/18/2009  . Acute maxillary sinusitis 05/14/2010  . ALLERGIC RHINITIS 04/09/2007  . Anal fissure 04/07/2016  . B12 DEFICIENCY 06/07/2007  . BACK PAIN WITH RADICULOPATHY 04/24/2008  . Cancer ([place])    skin  . CHEST WALL PAIN, ACUTE 06/15/2009  . Chronic maxillary sinusitis 05/29/2008  . COLITIS 04/27/2009  . COLONIC POLYPS, HX OF 04/27/2009   tubular adenomas  . Coronary artery disease 05/12/2014   Cath 05/12/2014 w/ severe 3-vessel CAD and preserved LV function, EF 55%  . DERMATITIS, ATOPIC 10/12/2007  . DIVERTICULOSIS, COLON 04/27/2009  . ECCHYMOSES, SPONTANEOUS 06/27/2008  . Elevated sedimentation rate 05/02/2009  . ESOPHAGEAL STRICTURE 04/27/2009  . GASTRITIS, CHRONIC 04/27/2009  . HYPERLIPIDEMIA 03/13/2008  . HYPERTENSION 04/09/2007  .  Internal bleeding hemorrhoids 01/22/2015   01/22/2015 Seen at anoscopy, grade 1 all 3 positions   . Irritable bowel syndrome 08/11/2007  . KIDNEY DISEASE 04/09/2007  . NECK PAIN 09/14/2008  . NEUROPATHY, IDIOPATHIC PERIPHERAL NEC 08/11/2007  . OSTEOARTHRITIS, WRIST, RIGHT 08/05/2010  . Postoperative delirium 05/20/2014  . S/P CABG x 4 05/19/2014   LIMA to LAD, SVG to diag, SVG to OM, SVG to PDA, EVH via right thigh and leg    Allergies Allergies  Allergen Reactions  . Lipitor [Atorvastatin] Other (See Comments)    REACTION: nausea and blurred vision  . Trazodone And Nefazodone Other (See Comments)    dizzy  . Ciprofloxacin Swelling  . Mycophenolate Mofetil Other (See Comments)    REACTION: unspecified  . Amoxicillin Rash    Has patient had a PCN reaction causing immediate rash, facial/tongue/throat swelling, SOB or lightheadedness with hypotension: Unknown Has patient had a PCN reaction causing severe rash involving mucus membranes or skin necrosis: Unknown Has patient had a PCN reaction that required hospitalization: Unknown Has patient had a PCN reaction occurring within the last 10 years: Unknown If all of the above answers are "NO", then may proceed with Cephalosporin use.   [name] Kitchen Penicillins Rash    Has patient had a PCN reaction causing immediate rash, facial/tongue/throat swelling, SOB or lightheadedness with hypotension: Unknown Has patient had a PCN reaction causing severe rash involving mucus membranes or skin necrosis: Unknown Has patient had a PCN reaction that required hospitalization: Unknown Has patient had a PCN reaction occurring within the last 10  years: Unknown If all of the above answers are "NO", then may proceed with Cephalosporin use.   . Rosuvastatin Other (See Comments)    Unknown     IV Location/Drains/Wounds Patient Lines/Drains/Airways Status   Active Line/Drains/Airways    Name:   Placement date:   Placement time:   Site:   Days:   Peripheral IV 07/31/17 Right  Hand  07/31/17    1251    Hand    less than 1   Incision (Closed) 05/19/14 Sternum Mid;Anterior  05/19/14    0943      1169   Incision (Closed) 05/19/14 Leg Right;Medial;Mid  05/19/14    0943      1169          Labs/Imaging Results for orders placed or performed during the hospital encounter of 07/31/17 (from the past 48 hour(s))  Basic metabolic panel     Status: Abnormal   Collection Time: 07/31/17 12:44 PM  Result Value Ref Range   Sodium 136 135 - 145 mmol/L   Potassium 4.0 3.5 - 5.1 mmol/L   Chloride 106 101 - 111 mmol/L   CO2 21 (L) 22 - 32 mmol/L   Glucose, Bld 141 (H) 65 - 99 mg/dL   BUN 29 (H) 6 - 20 mg/dL   Creatinine, Ser 1.06 0.61 - 1.24 mg/dL   Calcium 8.3 (L) 8.9 - 10.3 mg/dL   GFR calc non Af Amer >60 >60 mL/min   GFR calc Af Amer >60 >60 mL/min    Comment: (NOTE) The eGFR has been calculated using the CKD EPI equation. This calculation has not been validated in all clinical situations. eGFR's persistently <60 mL/min signify possible Chronic Kidney Disease.    Anion gap 9 5 - 15  CBC with Differential     Status: Abnormal   Collection Time: 07/31/17 12:44 PM  Result Value Ref Range   WBC 6.0 4.0 - 10.5 K/uL   RBC 4.25 4.22 - 5.81 MIL/uL   Hemoglobin 11.7 (L) 13.0 - 17.0 g/dL   HCT 34.6 (L) 39.0 - 52.0 %   MCV 81.4 78.0 - 100.0 fL   MCH 27.5 26.0 - 34.0 pg   MCHC 33.8 30.0 - 36.0 g/dL   RDW 14.9 11.5 - 15.5 %   Platelets 97 (L) 150 - 400 K/uL    Comment: SPECIMEN CHECKED FOR CLOTS REPEATED TO VERIFY PLATELET COUNT CONFIRMED BY SMEAR    Neutrophils Relative % 84 %   Lymphocytes Relative 8 %   Monocytes Relative 8 %   Eosinophils Relative 0 %   Basophils Relative 0 %   Neutro Abs 5.0 1.7 - 7.7 K/uL   Lymphs Abs 0.5 (L) 0.7 - 4.0 K/uL   Monocytes Absolute 0.5 0.1 - 1.0 K/uL   Eosinophils Absolute 0.0 0.0 - 0.7 K/uL   Basophils Absolute 0.0 0.0 - 0.1 K/uL   Smear Review MORPHOLOGY UNREMARKABLE   I-Stat CG4 Lactic Acid, ED     Status: None    Collection Time: 07/31/17 12:49 PM  Result Value Ref Range   Lactic Acid, Venous 1.15 0.5 - 1.9 mmol/L  Urinalysis, Routine w reflex microscopic     Status: Abnormal   Collection Time: 07/31/17  2:40 PM  Result Value Ref Range   Color, Urine AMBER (A) YELLOW    Comment: BIOCHEMICALS MAY BE AFFECTED BY COLOR   APPearance HAZY (A) CLEAR   Specific Gravity, Urine 1.022 1.005 - 1.030   pH 5.0 5.0 - 8.0  Glucose, UA NEGATIVE NEGATIVE mg/dL   Hgb urine dipstick MODERATE (A) NEGATIVE   Bilirubin Urine NEGATIVE NEGATIVE   Ketones, ur NEGATIVE NEGATIVE mg/dL   Protein, ur >=300 (A) NEGATIVE mg/dL   Nitrite NEGATIVE NEGATIVE   Leukocytes, UA TRACE (A) NEGATIVE   RBC / HPF 0-5 0 - 5 RBC/hpf   WBC, UA 6-30 0 - 5 WBC/hpf   Bacteria, UA FEW (A) NONE SEEN   Squamous Epithelial / LPF NONE SEEN NONE SEEN   Mucus PRESENT    Hyaline Casts, UA PRESENT    No results found.  Pending Labs [facility]     Ordered   07/31/17 1257  Blood Culture (routine x 2)  BLOOD CULTURE X 2,   STAT     07/31/17 1258   07/31/17 1203  Urine culture  STAT,   STAT     07/31/17 1223   Signed and Held  Comprehensive metabolic panel  Tomorrow morning,   R     Signed and Held   Signed and Held  CBC  Tomorrow morning,   R     Signed and Held      Vitals/Pain Today's Vitals   07/31/17 1430 07/31/17 1445 07/31/17 1500 07/31/17 1615  BP: (!) 102/59 115/77 133/64 (!) 106/50  Pulse: 86 94 61 94  Resp: 16 (!) 30 (!) 28 (!) 21  Temp:      TempSrc:      SpO2: 95% 93% 96% 99%  Weight:      Height:      PainSc:        Isolation Precautions No active isolations  Medications Medications  cefTRIAXone (ROCEPHIN) 1 g in dextrose 5 % 50 mL IVPB (not administered)  sodium chloride 0.9 % bolus 1,000 mL (0 mLs Intravenous Stopped 07/31/17 1427)  sodium chloride 0.9 % bolus 1,000 mL (1,000 mLs Intravenous New Bag/Given 07/31/17 1427)  cefTRIAXone (ROCEPHIN) 2 g in dextrose 5 % 50 mL IVPB (0 g Intravenous  Stopped 07/31/17 1418)  acetaminophen (TYLENOL) tablet 650 mg (650 mg Oral Given 07/31/17 1617)    Mobility walks

## 2017-07-31 NOTE — H&P (Addendum)
History and Physical    [name] KZS:[id number] DOB: 09/24/1936 DOA: 07/31/2017  I have briefly reviewed the patient's prior medical records in [facility].  Microbiology obtain in PCPs office 2 days ago empirically with E. coli.  PCP: [name], MD  Patient coming from: Home  Chief Complaint: Fever and chills  HPI: Timothy Gallagher is a 81 y.o. male with medical history significant of hypertension, coronary artery disease status post CABG, hyperlipidemia, presents to the emergency room from his PCPs office with fever and chills.  Patient has seen his PCP 2 days ago on 9/12 with subjective fever and chills as well as dysuria.  He was diagnosed with a UTI and was empirically given ceftriaxone IM followed by Keflex at home.  Patient reports that he has been taking his antibiotics, however has been having persistent fever and chills over the last 2 days and started having uncontrollable shaking last night.  He was seen again by his PCP today, and he was felt to be septic and sent to the emergency room for further evaluation.  Patient has been complaining of rigors, fevers at home.  He is also had nausea with several episodes of vomiting.  He has been complaining of back pain, however thinks it is his chronic back pain.  He also had an episode where describing that he is trying to pee but cannot.  He denies any chest pain or shortness of breath.  He is also been complaining of having confusion and this morning when he woke up he tells me he has not recognized his wife of 40 years initially.  ED Course: In the emergency room patient is febrile to 103, tachycardic and tachypneic.  Sepsis protocol was started and he was given IV fluids.  His lactic acid was normal at 1.1.  His platelets are low at 97.  His white count is normal.  His urinalysis showed trace leuks, moderate hemoglobin. We were asked to admit  Review of Systems: As per HPI otherwise 10 point review of systems negative.    Past Medical History:  Diagnosis Date  . ACNE ROSACEA 06/27/2009  . ACTINIC KERATOSIS, HEAD 04/18/2009  . Acute maxillary sinusitis 05/14/2010  . ALLERGIC RHINITIS 04/09/2007  . Anal fissure 04/07/2016  . B12 DEFICIENCY 06/07/2007  . BACK PAIN WITH RADICULOPATHY 04/24/2008  . Cancer ([place])    skin  . CHEST WALL PAIN, ACUTE 06/15/2009  . Chronic maxillary sinusitis 05/29/2008  . COLITIS 04/27/2009  . COLONIC POLYPS, HX OF 04/27/2009   tubular adenomas  . Coronary artery disease 05/12/2014   Cath 05/12/2014 w/ severe 3-vessel CAD and preserved LV function, EF 55%  . DERMATITIS, ATOPIC 10/12/2007  . DIVERTICULOSIS, COLON 04/27/2009  . ECCHYMOSES, SPONTANEOUS 06/27/2008  . Elevated sedimentation rate 05/02/2009  . ESOPHAGEAL STRICTURE 04/27/2009  . GASTRITIS, CHRONIC 04/27/2009  . HYPERLIPIDEMIA 03/13/2008  . HYPERTENSION 04/09/2007  . Internal bleeding hemorrhoids 01/22/2015   01/22/2015 Seen at anoscopy, grade 1 all 3 positions   . Irritable bowel syndrome 08/11/2007  . KIDNEY DISEASE 04/09/2007  . NECK PAIN 09/14/2008  . NEUROPATHY, IDIOPATHIC PERIPHERAL NEC 08/11/2007  . OSTEOARTHRITIS, WRIST, RIGHT 08/05/2010  . Postoperative delirium 05/20/2014  . S/P CABG x 4 05/19/2014   LIMA to LAD, SVG to diag, SVG to OM, SVG to PDA, EVH via right thigh and leg    Past Surgical History:  Procedure Laterality Date  . CARDIAC CATHETERIZATION    . COLONOSCOPY    . CORONARY ARTERY  BYPASS GRAFT N/A 05/19/2014   Procedure: CORONARY ARTERY BYPASS GRAFTING (CABG);  Surgeon: Rexene Alberts, MD;  Location: [place];  Service: Open Heart Surgery;  Laterality: N/A;  Times 4 using left internal mammary artery and endoscopically harvested right saphenous vein  . ESOPHAGOGASTRODUODENOSCOPY    . FINGER SURGERY     cut off end of finger  . FLEXIBLE SIGMOIDOSCOPY    . HEMORRHOID BANDING    . HERNIA REPAIR    . INCISION AND DRAINAGE WOUND WITH FOREIGN BODY REMOVAL Left 12/20/2013   Procedure: INCISION AND DRAINAGE LEFT INDEX  FINGER;  Surgeon: Tennis Must, MD;  Location: [facility];  Service: Orthopedics;  Laterality: Left;  . INTRAOPERATIVE TRANSESOPHAGEAL ECHOCARDIOGRAM N/A 05/19/2014   Procedure: INTRAOPERATIVE TRANSESOPHAGEAL ECHOCARDIOGRAM;  Surgeon: Rexene Alberts, MD;  Location: [place];  Service: Open Heart Surgery;  Laterality: N/A;  . lamenectomy    . LEFT HEART CATHETERIZATION WITH CORONARY ANGIOGRAM N/A 05/12/2014   Procedure: LEFT HEART CATHETERIZATION WITH CORONARY ANGIOGRAM;  Surgeon: Sinclair Grooms, MD;  Location: [facility];  Service: Cardiovascular;  Laterality: N/A;  . LUMBAR FUSION    . TONSILLECTOMY    . VARICOSE VEIN SURGERY Left      reports that he quit smoking about 18 years ago. His smoking use included Cigarettes. He has a 21.00 pack-year smoking history. He has never used smokeless tobacco. He reports that he does not drink alcohol or use drugs.  Allergies  Allergen Reactions  . Lipitor [Atorvastatin] Other (See Comments)    REACTION: nausea and blurred vision  . Trazodone And Nefazodone Other (See Comments)    dizzy  . Ciprofloxacin Swelling  . Mycophenolate Mofetil Other (See Comments)    REACTION: unspecified  . Amoxicillin Rash    Has patient had a PCN reaction causing immediate rash, facial/tongue/throat swelling, SOB or lightheadedness with hypotension: Unknown Has patient had a PCN reaction causing severe rash involving mucus membranes or skin necrosis: Unknown Has patient had a PCN reaction that required hospitalization: Unknown Has patient had a PCN reaction occurring within the last 10 years: Unknown If all of the above answers are "NO", then may proceed with Cephalosporin use.   [name] Kitchen Penicillins Rash    Has patient had a PCN reaction causing immediate rash, facial/tongue/throat swelling, SOB or lightheadedness with hypotension: Unknown Has patient had a PCN reaction causing severe rash involving mucus membranes or skin necrosis: Unknown Has patient had a PCN reaction that  required hospitalization: Unknown Has patient had a PCN reaction occurring within the last 10 years: Unknown If all of the above answers are "NO", then may proceed with Cephalosporin use.   . Rosuvastatin Other (See Comments)    Unknown     Family History  Problem Relation Age of Onset  . Hernia Mother   . Heart disease Father        smoker  . COPD Father   . Colon cancer Neg Hx     Prior to Admission medications   Medication Sig Start Date End Date Taking? Authorizing Provider  acetaminophen (TYLENOL) 500 MG tablet Take 500 mg by mouth daily as needed (for pain). Reported on 04/11/2016   Yes [provider]  aspirin 81 MG EC tablet Take 1 tablet (81 mg total) by mouth daily. 06/19/15  Yes Belva Crome, MD  atorvastatin (LIPITOR) 20 MG tablet Take 1 tablet (20 mg total) by mouth daily. 12/18/16 07/31/17 Yes Belva Crome, MD  cephALEXin (KEFLEX) 500 MG  capsule Take 1 capsule (500 mg total) by mouth 3 (three) times daily. Patient taking differently: Take 500 mg by mouth 3 (three) times daily. Started 09/12 for 10 days 07/29/17 08/08/17 Yes [name], MD  cetirizine (ZYRTEC) 10 MG tablet Take 10 mg by mouth daily as needed for allergies.    Yes [provider]  cyanocobalamin (,VITAMIN B-12,) 1000 MCG/ML injection Inject 1,000 mcg into the muscle every 30 (thirty) days.   Yes [provider]  fluticasone (FLONASE) 50 MCG/ACT nasal spray USE 1 TO 2 SPRAYS IN EACH NOSTRIL DAILY AS NEEDED FOR ALLERGIES 01/28/17  Yes [name], MD  metoprolol succinate (TOPROL-XL) 25 MG 24 hr tablet Take 0.5 tablets (12.5 mg total) by mouth daily. 12/08/16  Yes [name], MD  Multiple Vitamins-Minerals (CENTRUM SILVER PO) Take 1 tablet by mouth daily.   Yes [provider]  zolpidem (AMBIEN) 5 MG tablet TAKE 1 TABLET BY MOUTH AT BEDTIME AS NEEDED FOR SLEEP 03/25/17  Yes [name], MD    Physical Exam: Vitals:   07/31/17 1430 07/31/17 1445 07/31/17  1500 07/31/17 1615  BP: (!) 102/59 115/77 133/64 (!) 106/50  Pulse: 86 94 61 94  Resp: 16 (!) 30 (!) 28 (!) 21  Temp:      TempSrc:      SpO2: 95% 93% 96% 99%  Weight:      Height:          Constitutional: Has rigors Eyes: PERRL, lids and conjunctivae normal ENMT: Mucous membranes are moist.  Neck: normal, supple Respiratory: clear to auscultation bilaterally, no wheezing, no crackles. Normal respiratory effort. No accessory muscle use.  Cardiovascular: Regular rate and rhythm, no murmurs / rubs / gallops. No extremity edema. 2+ pedal pulses.  Abdomen: no tenderness, no masses palpated. Bowel sounds positive.  Musculoskeletal: no clubbing / cyanosis. Normal muscle tone.  Positive CVA tenderness on the left Skin: no rashes, lesions, ulcers. No induration Neurologic: CN 2-12 grossly intact. Strength 5/5 in all 4.  Psychiatric: Normal judgment and insight. Alert and oriented x 3. Normal mood.   Labs on Admission: I have personally reviewed following labs and imaging studies  CBC:  Recent Labs Lab 07/31/17 1244  WBC 6.0  NEUTROABS 5.0  HGB 11.7*  HCT 34.6*  MCV 81.4  PLT 97*   Basic Metabolic Panel:  Recent Labs Lab 07/31/17 1244  NA 136  K 4.0  CL 106  CO2 21*  GLUCOSE 141*  BUN 29*  CREATININE 1.06  CALCIUM 8.3*   GFR: Estimated Creatinine Clearance: 63.9 mL/min (by C-G formula based on SCr of 1.06 mg/dL). Liver Function Tests: No results for input(s): AST, ALT, ALKPHOS, BILITOT, PROT, ALBUMIN in the last 168 hours. No results for input(s): LIPASE, AMYLASE in the last 168 hours. No results for input(s): AMMONIA in the last 168 hours. Coagulation Profile: No results for input(s): INR, PROTIME in the last 168 hours. Cardiac Enzymes: No results for input(s): CKTOTAL, CKMB, CKMBINDEX, TROPONINI in the last 168 hours. BNP (last 3 results) No results for input(s): PROBNP in the last 8760 hours. HbA1C: No results for input(s): HGBA1C in the last 72  hours. CBG: No results for input(s): GLUCAP in the last 168 hours. Lipid Profile: No results for input(s): CHOL, HDL, LDLCALC, TRIG, CHOLHDL, LDLDIRECT in the last 72 hours. Thyroid Function Tests: No results for input(s): TSH, T4TOTAL, FREET4, T3FREE, THYROIDAB in the last 72 hours. Anemia Panel: No results for input(s): VITAMINB12, FOLATE, FERRITIN, TIBC,  IRON, RETICCTPCT in the last 72 hours. Urine analysis:    Component Value Date/Time   COLORURINE AMBER (A) 07/31/2017 1440   APPEARANCEUR HAZY (A) 07/31/2017 1440   LABSPEC 1.022 07/31/2017 1440   PHURINE 5.0 07/31/2017 1440   GLUCOSEU NEGATIVE 07/31/2017 1440   GLUCOSEU NEGATIVE 11/03/2007 1058   HGBUR MODERATE (A) 07/31/2017 1440   BILIRUBINUR NEGATIVE 07/31/2017 1440   BILIRUBINUR 1+ 07/29/2017 0923   KETONESUR NEGATIVE 07/31/2017 1440   PROTEINUR >=300 (A) 07/31/2017 1440   UROBILINOGEN 0.2 07/29/2017 0923   UROBILINOGEN 0.2 05/17/2014 1158   NITRITE NEGATIVE 07/31/2017 1440   LEUKOCYTESUR TRACE (A) 07/31/2017 1440     Radiological Exams on Admission: No results found.  EKG: Independently reviewed.  Sinus tachycardia  Assessment/Plan Active Problems:   Hyperlipidemia   Essential hypertension   CAD (coronary artery disease) of artery bypass graft   Sepsis secondary to UTI ([place])   Acute pyelonephritis   Thrombocytopenia ([facility])    Sepsis due to UTI/pyelonephritis -Patient's urine cultures in office 2 days ago show E. Coli (sensitivities pending), start ceftriaxone IV, urine cultures sent again, blood cultures were sent as per sepsis protocol, fluid resuscitated per protocol -Blood pressure is stable, admit to floor -Patient has CVA tenderness on left, obtain renal ultrasound to rule out forming abscess as cause for his decline and lack of response with antibiotics over the last few days -He reports symptoms consistent with urinary retention, have asked for a bladder scan.  He denies personal history of  BPH  Hypertension -Hold home medications in the setting of sepsis  Coronary artery disease -Continue aspirin, no chest pain  Thrombocytopenia -Likely in the setting of sepsis, no bleeding, continue to monitor.  SCDs for DVT prophylaxis.  He did have thrombocytopenia in the past around 2015 but most recently it has been within normal parameters  Acute encephalopathy -Likely in the setting of sepsis, had an episode of confusion at home.  He now appears back to his normal self   DVT prophylaxis: SCD  Code Status: Full code  Family Communication: wife bedside Disposition Plan: admit to telemetry Consults called: none    Admission status: Inpatient    At the time of admission, it appears that the appropriate admission status for this patient is INPATIENT. This is judged to be reasonable and necessary in order to provide the required high service intensity to ensure the patient's safety given the presenting symptoms, physical exam findings, and initial radiographic and laboratory data in the context of their chronic comorbidities. Current circumstances are sepsis, and it is felt to place patient at high risk for further clinical deterioration threatening life, limb, or organ. Moreover, it is my clinical judgment that the patient will require inpatient hospital care spanning beyond 2 midnights from the point of admission and that early discharge would result in unnecessary risk of decompensation and readmission or threat to life, limb or bodily function.   Marzetta Board, MD [facility] Pager [phone]  If 7PM-7AM, please contact night-coverage [email or web address] Password [facility]  07/31/2017, 4:19 PM

## 2017-07-31 NOTE — ED Notes (Signed)
Call report to [facility] [phone] at [phone]

## 2017-07-31 NOTE — ED Notes (Signed)
Unsuccessful attempt at second  IV and blood culture collection.

## 2017-07-31 NOTE — ED Provider Notes (Signed)
[facility] DEPT Provider Note   CSN: [id number] Arrival date & time: 07/31/17  1133     History   Chief Complaint Chief Complaint  Patient presents with  . Urinary Tract Infection    HPI Timothy Gallagher is a 81 y.o. male.  The history is provided by the patient and medical records. No language interpreter was used.  Urinary Tract Infection   Associated symptoms include chills and nausea.   Timothy Gallagher is a 81 y.o. male  with an extensive PMH as listed below who was sent to the Emergency Department by PCP for concerns of failed outpatient management of UTI. Patient was initially seen on 9/12 where he was started on Keflex. He has been taking this as directed but notes feeling no better. He endorses symptoms of chills, intermittent nausea and intermittent confusion. This morning, he had an episode of confusion much worse than previous. He saw his PCP again this morning where he was noted to be slightly hypotensive and febrile. He was sent to ED for further evaluation. Of note, urine cx was obtained at initial visit and positive for e. Coli. No aggravating or alleviating factors noted.  Past Medical History:  Diagnosis Date  . ACNE ROSACEA 06/27/2009  . ACTINIC KERATOSIS, HEAD 04/18/2009  . Acute maxillary sinusitis 05/14/2010  . ALLERGIC RHINITIS 04/09/2007  . Anal fissure 04/07/2016  . B12 DEFICIENCY 06/07/2007  . BACK PAIN WITH RADICULOPATHY 04/24/2008  . Cancer ([place])    skin  . CHEST WALL PAIN, ACUTE 06/15/2009  . Chronic maxillary sinusitis 05/29/2008  . COLITIS 04/27/2009  . COLONIC POLYPS, HX OF 04/27/2009   tubular adenomas  . Coronary artery disease 05/12/2014   Cath 05/12/2014 w/ severe 3-vessel CAD and preserved LV function, EF 55%  . DERMATITIS, ATOPIC 10/12/2007  . DIVERTICULOSIS, COLON 04/27/2009  . ECCHYMOSES, SPONTANEOUS 06/27/2008  . Elevated sedimentation rate 05/02/2009  . ESOPHAGEAL STRICTURE 04/27/2009  . GASTRITIS, CHRONIC 04/27/2009  . HYPERLIPIDEMIA 03/13/2008    . HYPERTENSION 04/09/2007  . Internal bleeding hemorrhoids 01/22/2015   01/22/2015 Seen at anoscopy, grade 1 all 3 positions   . Irritable bowel syndrome 08/11/2007  . KIDNEY DISEASE 04/09/2007  . NECK PAIN 09/14/2008  . NEUROPATHY, IDIOPATHIC PERIPHERAL NEC 08/11/2007  . OSTEOARTHRITIS, WRIST, RIGHT 08/05/2010  . Postoperative delirium 05/20/2014  . S/P CABG x 4 05/19/2014   LIMA to LAD, SVG to diag, SVG to OM, SVG to PDA, EVH via right thigh and leg    Patient Active Problem List   Diagnosis Date Noted  . Detached retina, left 06/30/2017  . AAA (abdominal aortic aneurysm) ([place]) 05/12/2017  . Left sided abdominal pain 04/15/2017  . Low back pain 02/06/2017  . BPPV (benign paroxysmal positional vertigo) 09/01/2016  . Anal fissure 04/07/2016  . Internal and external bleeding hemorrhoids 01/22/2015  . Insomnia 11/22/2014  . Anemia 07/20/2014  . CAD (coronary artery disease) of artery bypass graft 05/19/2014  . BPH (benign prostatic hyperplasia) 12/05/2013  . Palpitations 02/11/2013  . OSTEOARTHRITIS, WRIST, RIGHT 08/05/2010  . ACNE ROSACEA 06/27/2009  . ESOPHAGEAL STRICTURE 04/27/2009  . ACTINIC KERATOSIS, HEAD 04/18/2009  . NECK PAIN 09/14/2008  . Hyperlipidemia 03/13/2008  . NEUROPATHY, IDIOPATHIC PERIPHERAL NEC 08/11/2007  . Irritable bowel syndrome 08/11/2007  . B12 deficiency 06/07/2007  . Essential hypertension 04/09/2007  . ALLERGIC RHINITIS 04/09/2007    Past Surgical History:  Procedure Laterality Date  . CARDIAC CATHETERIZATION    . COLONOSCOPY    . CORONARY ARTERY BYPASS GRAFT N/A  05/19/2014   Procedure: CORONARY ARTERY BYPASS GRAFTING (CABG);  Surgeon: Rexene Alberts, MD;  Location: [facility];  Service: Open Heart Surgery;  Laterality: N/A;  Times 4 using left internal mammary artery and endoscopically harvested right saphenous vein  . ESOPHAGOGASTRODUODENOSCOPY    . FINGER SURGERY     cut off end of finger  . FLEXIBLE SIGMOIDOSCOPY    . HEMORRHOID BANDING    . HERNIA  REPAIR    . INCISION AND DRAINAGE WOUND WITH FOREIGN BODY REMOVAL Left 12/20/2013   Procedure: INCISION AND DRAINAGE LEFT INDEX FINGER;  Surgeon: Tennis Must, MD;  Location: [facility];  Service: Orthopedics;  Laterality: Left;  . INTRAOPERATIVE TRANSESOPHAGEAL ECHOCARDIOGRAM N/A 05/19/2014   Procedure: INTRAOPERATIVE TRANSESOPHAGEAL ECHOCARDIOGRAM;  Surgeon: Rexene Alberts, MD;  Location: [facility];  Service: Open Heart Surgery;  Laterality: N/A;  . lamenectomy    . LEFT HEART CATHETERIZATION WITH CORONARY ANGIOGRAM N/A 05/12/2014   Procedure: LEFT HEART CATHETERIZATION WITH CORONARY ANGIOGRAM;  Surgeon: Sinclair Grooms, MD;  Location: [facility];  Service: Cardiovascular;  Laterality: N/A;  . LUMBAR FUSION    . TONSILLECTOMY    . VARICOSE VEIN SURGERY Left        Home Medications    Prior to Admission medications   Medication Sig Start Date End Date Taking? Authorizing Provider  acetaminophen (TYLENOL) 500 MG tablet Take 500 mg by mouth daily as needed (for pain). Reported on 04/11/2016   Yes [provider]  aspirin 81 MG EC tablet Take 1 tablet (81 mg total) by mouth daily. 06/19/15  Yes Belva Crome, MD  atorvastatin (LIPITOR) 20 MG tablet Take 1 tablet (20 mg total) by mouth daily. 12/18/16 07/31/17 Yes Belva Crome, MD  cephALEXin (KEFLEX) 500 MG capsule Take 1 capsule (500 mg total) by mouth 3 (three) times daily. Patient taking differently: Take 500 mg by mouth 3 (three) times daily. Started 09/12 for 10 days 07/29/17 08/08/17 Yes [name], MD  cetirizine (ZYRTEC) 10 MG tablet Take 10 mg by mouth daily as needed for allergies.    Yes [provider]  cyanocobalamin (,VITAMIN B-12,) 1000 MCG/ML injection Inject 1,000 mcg into the muscle every 30 (thirty) days.   Yes [provider]  fluticasone (FLONASE) 50 MCG/ACT nasal spray USE 1 TO 2 SPRAYS IN EACH NOSTRIL DAILY AS NEEDED FOR ALLERGIES 01/28/17  Yes [name], MD  metoprolol succinate  (TOPROL-XL) 25 MG 24 hr tablet Take 0.5 tablets (12.5 mg total) by mouth daily. 12/08/16  Yes [name], MD  Multiple Vitamins-Minerals (CENTRUM SILVER PO) Take 1 tablet by mouth daily.   Yes [provider]  zolpidem (AMBIEN) 5 MG tablet TAKE 1 TABLET BY MOUTH AT BEDTIME AS NEEDED FOR SLEEP 03/25/17  Yes [name], MD    Family History Family History  Problem Relation Age of Onset  . Hernia Mother   . Heart disease Father        smoker  . COPD Father   . Colon cancer Neg Hx     Social History Social History  Substance Use Topics  . Smoking status: Former Smoker    Packs/day: 0.50    Years: 42.00    Types: Cigarettes    Quit date: 11/17/1998  . Smokeless tobacco: Never Used  . Alcohol use No     Allergies   Lipitor [atorvastatin]; Trazodone and nefazodone; Ciprofloxacin; Mycophenolate mofetil; Amoxicillin; Penicillins; and Rosuvastatin   Review of Systems  Review of Systems  Constitutional: Positive for chills and fatigue.  Gastrointestinal: Positive for nausea.  Neurological:       + confusion  All other systems reviewed and are negative.    Physical Exam Updated Vital Signs BP 133/64   Pulse 61   Temp (!) 103 F (39.4 C) (Rectal)   Resp (!) 28   Ht 6\' 1"  (1.854 m)   Wt 88 kg (194 lb)   SpO2 96%   BMI 25.60 kg/m   Physical Exam  Constitutional: He is oriented to person, place, and time. He appears well-developed and well-nourished. No distress.  HENT:  Head: Normocephalic and atraumatic.  Cardiovascular: Normal heart sounds.   No murmur heard. Tachycardic but regular.  Pulmonary/Chest: Effort normal and breath sounds normal. No respiratory distress.  Abdominal: Soft. He exhibits no distension. There is tenderness (Suprapubic). There is no rebound and no guarding.  Musculoskeletal: He exhibits no edema.  Neurological: He is alert and oriented to person, place, and time.  Skin: Skin is warm and dry.  Nursing note and vitals  reviewed.    ED Treatments / Results  Labs (all labs ordered are listed, but only abnormal results are displayed) Labs Reviewed  URINALYSIS, ROUTINE W REFLEX MICROSCOPIC - Abnormal; Notable for the following:       Result Value   Color, Urine AMBER (*)    APPearance HAZY (*)    Hgb urine dipstick MODERATE (*)    Protein, ur >=300 (*)    Leukocytes, UA TRACE (*)    Bacteria, UA FEW (*)    All other components within normal limits  BASIC METABOLIC PANEL - Abnormal; Notable for the following:    CO2 21 (*)    Glucose, Bld 141 (*)    BUN 29 (*)    Calcium 8.3 (*)    All other components within normal limits  CBC WITH DIFFERENTIAL/PLATELET - Abnormal; Notable for the following:    Hemoglobin 11.7 (*)    HCT 34.6 (*)    Platelets 97 (*)    Lymphs Abs 0.5 (*)    All other components within normal limits  URINE CULTURE  CULTURE, BLOOD (ROUTINE X 2)  CULTURE, BLOOD (ROUTINE X 2)  I-STAT CG4 LACTIC ACID, ED    EKG  EKG Interpretation None       Radiology No results found.  Procedures Procedures (including critical care time)   Medications Ordered in ED Medications  cefTRIAXone (ROCEPHIN) 1 g in dextrose 5 % 50 mL IVPB (not administered)  acetaminophen (TYLENOL) tablet 650 mg (not administered)  sodium chloride 0.9 % bolus 1,000 mL (0 mLs Intravenous Stopped 07/31/17 1427)  sodium chloride 0.9 % bolus 1,000 mL (1,000 mLs Intravenous New Bag/Given 07/31/17 1427)  cefTRIAXone (ROCEPHIN) 2 g in dextrose 5 % 50 mL IVPB (0 g Intravenous Stopped 07/31/17 1418)     Initial Impression / Assessment and Plan / ED Course  I have reviewed the triage vital signs and the nursing notes.  Pertinent labs & imaging results that were available during my care of the patient were reviewed by me and considered in my medical decision making (see chart for details).    Timothy Gallagher is a 81 y.o. male who presents to ED from PCP for hypotension and fever in the setting of recently  diagnosed UTI. Upon arrival, patient febrile, tachycardic and hypotensive. Blood cx's obtained and patient started on ABX and fluids. Labs reviewed with normal white count and lactic. Hospitalist consulted who  will admit.   Patient seen by and discussed with Dr. Johnney Killian who agrees with treatment plan.   Final Clinical Impressions(s) / ED Diagnoses   Final diagnoses:  Complicated UTI (urinary tract infection)    New Prescriptions New Prescriptions   No medications on file     Lillianah Swartzentruber, Ozella Almond, PA-C 07/31/17 [place], [place], MD 08/04/17 [phone]

## 2017-07-31 NOTE — Progress Notes (Signed)
Subjective:  Timothy Gallagher is a 81 y.o. year old very pleasant male patient who presents for/with See problem oriented charting ROS- rigors/chills. No fever. Lightheaded. Periods of confusion. Mildly short of breath which is new for him. Difficulty walking/fatigue   Past Medical History-  Patient Active Problem List   Diagnosis Date Noted  . Detached retina, left 06/30/2017    Priority: High  . AAA (abdominal aortic aneurysm) ([place]) 05/12/2017    Priority: High  . CAD (coronary artery disease) of artery bypass graft 05/19/2014    Priority: High  . BPPV (benign paroxysmal positional vertigo) 09/01/2016    Priority: Medium  . Insomnia 11/22/2014    Priority: Medium  . Anemia 07/20/2014    Priority: Medium  . BPH (benign prostatic hyperplasia) 12/05/2013    Priority: Medium  . Hyperlipidemia 03/13/2008    Priority: Medium  . B12 deficiency 06/07/2007    Priority: Medium  . Essential hypertension 04/09/2007    Priority: Medium  . Low back pain 02/06/2017    Priority: Low  . Internal and external bleeding hemorrhoids 01/22/2015    Priority: Low  . Palpitations 02/11/2013    Priority: Low  . OSTEOARTHRITIS, WRIST, RIGHT 08/05/2010    Priority: Low  . ACNE ROSACEA 06/27/2009    Priority: Low  . ESOPHAGEAL STRICTURE 04/27/2009    Priority: Low  . ACTINIC KERATOSIS, HEAD 04/18/2009    Priority: Low  . NECK PAIN 09/14/2008    Priority: Low  . NEUROPATHY, IDIOPATHIC PERIPHERAL NEC 08/11/2007    Priority: Low  . Irritable bowel syndrome 08/11/2007    Priority: Low  . ALLERGIC RHINITIS 04/09/2007    Priority: Low  . Left sided abdominal pain 04/15/2017  . Anal fissure 04/07/2016    Medications- reviewed and updated Current Outpatient Prescriptions  Medication Sig Dispense Refill  . acetaminophen (TYLENOL) 500 MG tablet Take 500 mg by mouth daily as needed (for pain). Reported on 04/11/2016    . aspirin 81 MG EC tablet Take 1 tablet (81 mg total) by mouth daily.    .  cephALEXin (KEFLEX) 500 MG capsule Take 1 capsule (500 mg total) by mouth 3 (three) times daily. 30 capsule 0  . cetirizine (ZYRTEC) 10 MG tablet Take 10 mg by mouth daily as needed for allergies.     . fluticasone (FLONASE) 50 MCG/ACT nasal spray USE 1 TO 2 SPRAYS IN EACH NOSTRIL DAILY AS NEEDED FOR ALLERGIES 16 g 3  . metoprolol succinate (TOPROL-XL) 25 MG 24 hr tablet Take 0.5 tablets (12.5 mg total) by mouth daily. 46 tablet 3  . Multiple Vitamins-Minerals (CENTRUM SILVER PO) Take 1 tablet by mouth daily.    [name] Kitchen ofloxacin (OCUFLOX) 0.3 % ophthalmic solution INT 1 GTT INTO OS QID  0  . prednisoLONE acetate (PRED FORTE) 1 % ophthalmic suspension INT 1 GTT INTO OS QID  0  . PRESCRIPTION MEDICATION VIT B-12 Take one (1) injection into the muscle every four (4) weeks.    [name] Kitchen zolpidem (AMBIEN) 5 MG tablet TAKE 1 TABLET BY MOUTH AT BEDTIME AS NEEDED FOR SLEEP 30 tablet 5  . atorvastatin (LIPITOR) 20 MG tablet Take 1 tablet (20 mg total) by mouth daily. 90 tablet 3   No current facility-administered medications for this visit.     Objective: BP 98/60 (BP Location: Left Arm, Patient Position: Sitting, Cuff Size: Large)   Pulse (!) 105   Temp 98.4 F (36.9 C) (Oral)   Ht 6\' 1"  (1.854 m)   Wt 194  lb (88 kg)   SpO2 97%   BMI 25.60 kg/m  Gen: NAD, resting comfortably, rigors noted CV: Tachycardic without murmur  Lungs: CTAB no crackles, wheeze, rhonchi Abdomen: soft/nontender/nondistended/normal bowel sounds.  Ext: no edema Skin: warm, dry, no rash  Assessment/Plan:   Urinary tract infection with hematuria, site unspecified Concern for sepsis S:  Patient seen 2 days ago. Complained of urinary frequency and UA concerning for UTI. Rectal exam without concern for prostatitis. Treated with ceftriaxone 1g and given keflex to take TID at home. He has been compliant with this. Urinary frequency slightly improved.   Last night he started with uncontrolled shaking. This has continued through this  morning. This morning he did not know who his wife was. This has cleared since that time. He feels unsteady on his feet and has new sensation in last day of feeling short of breath with activity though no chest pain.   Urine culture growing E. Coli but sensitivities not available yet.   A/P: 81  Year old male with history of CAD s/p CABG, BPH, HTN, HLD presents after 2 days of treatment for UTI with rigors, tachycardia (though we did take him off of his metoprolol due to BP concerns last visit), hypotension on initial check. I am concerned about potential for sepsis. Will have him transported by ambulance to hospital- would requests blood cultures and early antibiotics. Considered sending by car to ER but concerned about potential delays with this method in treatment. Considered repeat ceftriaxone but he requests to not get another IM injection- prefers to get by IV  Return precautions advised.  [name], MD

## 2017-08-01 ENCOUNTER — Encounter (HOSPITAL_COMMUNITY): Payer: Self-pay

## 2017-08-01 ENCOUNTER — Inpatient Hospital Stay (HOSPITAL_COMMUNITY): Payer: Medicare Other

## 2017-08-01 DIAGNOSIS — N390 Urinary tract infection, site not specified: Secondary | ICD-10-CM | POA: Diagnosis not present

## 2017-08-01 DIAGNOSIS — N10 Acute tubulo-interstitial nephritis: Secondary | ICD-10-CM | POA: Diagnosis not present

## 2017-08-01 DIAGNOSIS — N281 Cyst of kidney, acquired: Secondary | ICD-10-CM | POA: Diagnosis not present

## 2017-08-01 LAB — COMPREHENSIVE METABOLIC PANEL
ALT: 27 U/L (ref 17–63)
AST: 47 U/L — ABNORMAL HIGH (ref 15–41)
Albumin: 2.4 g/dL — ABNORMAL LOW (ref 3.5–5.0)
Alkaline Phosphatase: 56 U/L (ref 38–126)
Anion gap: 5 (ref 5–15)
BUN: 21 mg/dL — ABNORMAL HIGH (ref 6–20)
CO2: 24 mmol/L (ref 22–32)
Calcium: 8 mg/dL — ABNORMAL LOW (ref 8.9–10.3)
Chloride: 111 mmol/L (ref 101–111)
Creatinine, Ser: 0.86 mg/dL (ref 0.61–1.24)
GFR calc Af Amer: >60 mL/min (ref >60)
GFR calc non Af Amer: >60 mL/min (ref >60)
Glucose, Bld: 113 mg/dL — ABNORMAL HIGH (ref 65–99)
Potassium: 4.2 mmol/L (ref 3.5–5.1)
Sodium: 140 mmol/L (ref 135–145)
Total Bilirubin: 0.9 mg/dL (ref 0.3–1.2)
Total Protein: 5.6 g/dL — ABNORMAL LOW (ref 6.5–8.1)

## 2017-08-01 LAB — URINE CULTURE
Culture: NO GROWTH
MICRO NUMBER:: 81007059
SPECIMEN QUALITY:: ADEQUATE

## 2017-08-01 LAB — CBC
HCT: 31.6 % — ABNORMAL LOW (ref 39.0–52.0)
Hemoglobin: 10.8 g/dL — ABNORMAL LOW (ref 13.0–17.0)
MCH: 28.2 pg (ref 26.0–34.0)
MCHC: 34.2 g/dL (ref 30.0–36.0)
MCV: 82.5 fL (ref 78.0–100.0)
Platelets: 91 10*3/uL — ABNORMAL LOW (ref 150–400)
RBC: 3.83 MIL/uL — ABNORMAL LOW (ref 4.22–5.81)
RDW: 15.3 % (ref 11.5–15.5)
WBC: 5.3 10*3/uL (ref 4.0–10.5)

## 2017-08-01 MED ORDER — METOPROLOL SUCCINATE ER 25 MG PO TB24
12.5000 mg | ORAL_TABLET | Freq: Every day | ORAL | Status: DC
  Administered 2017-08-01 – 2017-08-02 (×2): 12.5 mg via ORAL
  Filled 2017-08-01 (×2): qty 1

## 2017-08-01 NOTE — Progress Notes (Addendum)
PROGRESS NOTE    Timothy Gallagher  YHC:[id number] DOB: 11/25/1935 DOA: 07/31/2017 PCP: [name], MD     Brief Narrative:  Timothy Gallagher is a 81 y.o. male with medical history significant of hypertension, coronary artery disease status post CABG, hyperlipidemia, presents to the emergency room from his PCPs office with fever and chills.  Patient has seen his PCP on 9/12 with subjective fever and chills as well as dysuria.  He was diagnosed with a UTI and was empirically given ceftriaxone IM followed by Keflex at home.  Patient reports that he has been taking his antibiotics, however has been having persistent fever and chills over the last 2 days and started having uncontrollable shaking last night.  He was seen again by his PCP, and he was felt to be septic and sent to the emergency room for further evaluation.  Patient has been complaining of rigors, fevers at home.  He is also had nausea with several episodes of vomiting.  He also had an episode where describing that he is trying to pee but cannot.  He is also been complaining of having confusion and this morning when he woke up he tells me he has not recognized his wife of 40 years initially.   Assessment & Plan:   Active Problems:   Hyperlipidemia   Essential hypertension   CAD (coronary artery disease) of artery bypass graft   Sepsis secondary to UTI ([place])   Acute pyelonephritis   Thrombocytopenia ([place])   Sepsis secondary to pyelonephritis -Urine culture from PCP office (9/12) showed E Coli, sensitivity pending. Failed outpatient keflex -Continue empiric Rocephin -Blood culture and urine culture pending -Renal ultrasound with renal cysts, no acute abnormalities   Essential hypertension -Continue Toprol. Blood pressure stable  CAD status post CABG -Daily aspirin, lipitor   Acute toxic encephalopathy -Had an episode of forgetting, not recognizing his wife. In setting of infection as above. Now resolved   DVT  prophylaxis: SCD in setting of thrombocytopenia Code Status: Full Family Communication: Wife at bedside Disposition Plan: Pending improvement in his pathology, likely return home   Consultants:   None  Procedures:   None  Antimicrobials:  Anti-infectives    Start     Dose/Rate Route Frequency Ordered Stop   08/01/17 1400  cefTRIAXone (ROCEPHIN) 1 g in dextrose 5 % 50 mL IVPB  Status:  Discontinued     1 g 100 mL/hr over 30 Minutes Intravenous Every 24 hours 07/31/17 1301 07/31/17 1806   08/01/17 1400  cefTRIAXone (ROCEPHIN) 2 g in dextrose 5 % 50 mL IVPB     2 g 100 mL/hr over 30 Minutes Intravenous Every 24 hours 07/31/17 1758     07/31/17 1315  cefTRIAXone (ROCEPHIN) 2 g in dextrose 5 % 50 mL IVPB     2 g 100 mL/hr over 30 Minutes Intravenous  Once 07/31/17 1300 07/31/17 1418       Subjective: Had multiple episodes of urinary incontinence overnight, which is unusual for him. Also having some rigors and chills. No complaints of chest pain or shortness of breath, no nausea or vomiting since admission.  Objective: Vitals:   07/31/17 1730 07/31/17 1758 07/31/17 2053 08/01/17 0447  BP: 111/66 118/61 128/82 130/67  Pulse: (!) 119 (!) 106 83 79  Resp: (!) 30 (!) 24 20 20   Temp:  98.8 F (37.1 C) 98.6 F (37 C) 98.6 F (37 C)  TempSrc:  Oral Oral Oral  SpO2: 95% 96% 97% 97%  Weight:  Height:        Intake/Output Summary (Last 24 hours) at 08/01/17 1156 Last data filed at 08/01/17 0457  Gross per 24 hour  Intake             1310 ml  Output              500 ml  Net              810 ml   Filed Weights   07/31/17 1308  Weight: 88 kg (194 lb)    Examination:  General exam: Appears calm and comfortable  Respiratory system: Clear to auscultation. Respiratory effort normal. Cardiovascular system: S1 & S2 heard, RRR. No JVD, murmurs, rubs, gallops or clicks. No pedal edema. Gastrointestinal system: Abdomen is nondistended, soft and nontender. No organomegaly or  masses felt. Normal bowel sounds heard. Central nervous system: Alert and oriented. No focal neurological deficits. Extremities: Symmetric 5 x 5 power. Skin: No rashes, lesions or ulcers Psychiatry: Judgement and insight appear normal. Mood & affect appropriate.   Data Reviewed: I have personally reviewed following labs and imaging studies  CBC:  Recent Labs Lab 07/31/17 1244 08/01/17 0458  WBC 6.0 5.3  NEUTROABS 5.0  --   HGB 11.7* 10.8*  HCT 34.6* 31.6*  MCV 81.4 82.5  PLT 97* 91*   Basic Metabolic Panel:  Recent Labs Lab 07/31/17 1244 08/01/17 0458  NA 136 140  K 4.0 4.2  CL 106 111  CO2 21* 24  GLUCOSE 141* 113*  BUN 29* 21*  CREATININE 1.06 0.86  CALCIUM 8.3* 8.0*   GFR: Estimated Creatinine Clearance: 78.7 mL/min (by C-G formula based on SCr of 0.86 mg/dL). Liver Function Tests:  Recent Labs Lab 08/01/17 0458  AST 47*  ALT 27  ALKPHOS 56  BILITOT 0.9  PROT 5.6*  ALBUMIN 2.4*   No results for input(s): LIPASE, AMYLASE in the last 168 hours. No results for input(s): AMMONIA in the last 168 hours. Coagulation Profile: No results for input(s): INR, PROTIME in the last 168 hours. Cardiac Enzymes: No results for input(s): CKTOTAL, CKMB, CKMBINDEX, TROPONINI in the last 168 hours. BNP (last 3 results) No results for input(s): PROBNP in the last 8760 hours. HbA1C: No results for input(s): HGBA1C in the last 72 hours. CBG: No results for input(s): GLUCAP in the last 168 hours. Lipid Profile: No results for input(s): CHOL, HDL, LDLCALC, TRIG, CHOLHDL, LDLDIRECT in the last 72 hours. Thyroid Function Tests: No results for input(s): TSH, T4TOTAL, FREET4, T3FREE, THYROIDAB in the last 72 hours. Anemia Panel: No results for input(s): VITAMINB12, FOLATE, FERRITIN, TIBC, IRON, RETICCTPCT in the last 72 hours. Sepsis Labs:  Recent Labs Lab 07/31/17 1249  LATICACIDVEN 1.15    Recent Results (from the past 240 hour(s))  Urine Culture     Status:  Abnormal (Preliminary result)   Collection Time: 07/29/17  9:30 AM  Result Value Ref Range Status   MICRO NUMBER: [id number]  Preliminary   SPECIMEN QUALITY: ADEQUATE  Preliminary   Sample Source URINE  Preliminary   STATUS: PRELIMINARY  Preliminary   ISOLATE 1: Escherichia coli (A)  Preliminary  Blood Culture (routine x 2)     Status: None (Preliminary result)   Collection Time: 07/31/17 12:44 PM  Result Value Ref Range Status   Specimen Description BLOOD LEFT HAND  Final   Special Requests IN PEDIATRIC BOTTLE Blood Culture adequate volume  Final   Culture   Final    NO GROWTH < 24 HOURS  Performed at [facility], [place] [address] [place], [place] [address]    Report Status PENDING  Incomplete  Blood Culture (routine x 2)     Status: None (Preliminary result)   Collection Time: 07/31/17  6:41 PM  Result Value Ref Range Status   Specimen Description BLOOD LEFT ARM  Final   Special Requests IN PEDIATRIC BOTTLE Blood Culture adequate volume  Final   Culture   Final    NO GROWTH < 12 HOURS Performed at [facility], [place] [address], [place], [place] [address]    Report Status PENDING  Incomplete       Radiology Studies: US Renal  Result Date: 08/01/2017 CLINICAL DATA:  Flank pain EXAM: RENAL / URINARY TRACT ULTRASOUND COMPLETE COMPARISON:  None. FINDINGS: Right Kidney: Length: 10.8 cm. Cysts are seen in the right kidney with the largest measuring 3.4 cm. Left Kidney: Length: 11.2 cm. A cyst measuring 2.3 cm is seen in the left kidney. Bladder: Appears normal for degree of bladder distention. IMPRESSION: Cystic changes in the kidneys.  No acute abnormalities. Electronically Signed   By: Dorise Bullion III M.D   On: 08/01/2017 10:43      Scheduled Meds: . aspirin EC  81 mg Oral Daily  . atorvastatin  20 mg Oral Daily  . fluticasone  2 spray Each Nare Daily   Continuous Infusions: . cefTRIAXone (ROCEPHIN)  IV       LOS: 1 day    Time spent: 40 minutes    Dessa Phi, DO [facility] [email or web address] Password [facility] 08/01/2017, 11:56 AM

## 2017-08-02 DIAGNOSIS — A419 Sepsis, unspecified organism: Secondary | ICD-10-CM | POA: Diagnosis present

## 2017-08-02 DIAGNOSIS — N10 Acute tubulo-interstitial nephritis: Secondary | ICD-10-CM | POA: Diagnosis not present

## 2017-08-02 DIAGNOSIS — N390 Urinary tract infection, site not specified: Secondary | ICD-10-CM | POA: Diagnosis not present

## 2017-08-02 LAB — CBC WITH DIFFERENTIAL/PLATELET
Basophils Absolute: 0 10*3/uL (ref 0.0–0.1)
Basophils Relative: 0 %
Eosinophils Absolute: 0.2 10*3/uL (ref 0.0–0.7)
Eosinophils Relative: 3 %
HCT: 33.9 % — ABNORMAL LOW (ref 39.0–52.0)
Hemoglobin: 11.2 g/dL — ABNORMAL LOW (ref 13.0–17.0)
Lymphocytes Relative: 21 %
Lymphs Abs: 1.2 10*3/uL (ref 0.7–4.0)
MCH: 27.1 pg (ref 26.0–34.0)
MCHC: 33 g/dL (ref 30.0–36.0)
MCV: 82.1 fL (ref 78.0–100.0)
Monocytes Absolute: 0.6 10*3/uL (ref 0.1–1.0)
Monocytes Relative: 11 %
Neutro Abs: 4 10*3/uL (ref 1.7–7.7)
Neutrophils Relative %: 66 %
Platelets: 119 10*3/uL — ABNORMAL LOW (ref 150–400)
RBC: 4.13 MIL/uL — ABNORMAL LOW (ref 4.22–5.81)
RDW: 15.2 % (ref 11.5–15.5)
WBC: 6 10*3/uL (ref 4.0–10.5)

## 2017-08-02 LAB — BASIC METABOLIC PANEL
Anion gap: 9 (ref 5–15)
BUN: 14 mg/dL (ref 6–20)
CO2: 21 mmol/L — ABNORMAL LOW (ref 22–32)
Calcium: 8.5 mg/dL — ABNORMAL LOW (ref 8.9–10.3)
Chloride: 110 mmol/L (ref 101–111)
Creatinine, Ser: 0.82 mg/dL (ref 0.61–1.24)
GFR calc Af Amer: >60 mL/min (ref >60)
GFR calc non Af Amer: >60 mL/min (ref >60)
Glucose, Bld: 111 mg/dL — ABNORMAL HIGH (ref 65–99)
Potassium: 3.7 mmol/L (ref 3.5–5.1)
Sodium: 140 mmol/L (ref 135–145)

## 2017-08-02 MED ORDER — CEFPODOXIME PROXETIL 200 MG PO TABS
200.0000 mg | ORAL_TABLET | Freq: Two times a day (BID) | ORAL | Status: DC
  Administered 2017-08-02: 200 mg via ORAL
  Filled 2017-08-02: qty 1

## 2017-08-02 MED ORDER — CEFPODOXIME PROXETIL 200 MG PO TABS: 200.0000 mg | ORAL_TABLET | Freq: Two times a day (BID) | ORAL | 0 refills | Status: DC

## 2017-08-02 NOTE — Care Management Note (Signed)
Case Management Note  Patient Details  Name: DEMARKO ZEIMET MRN: [id number] Date of Birth: 06-Aug-1936  Subjective/Objective:     UTI               Action/Plan: Discharge Planning: AVS reviewed Spoke to pt at bedside. Pt independent prior to hospital stay. Wife at home to assist with his care.   PCP [name] MD  Expected Discharge Date:  08/02/17               Expected Discharge Plan:  Home/Self Care  In-House Referral:  NA  Discharge planning Services  CM Consult  Post Acute Care Choice:  NA Choice offered to:  NA  DME Arranged:  N/A DME Agency:  NA  HH Arranged:  NA HH Agency:  NA  Status of Service:  Completed, signed off  If discussed at [place] of Stay Meetings, dates discussed:    Additional Comments:  Erenest Rasher, RN 08/02/2017, 10:48 AM

## 2017-08-02 NOTE — Discharge Instructions (Signed)
Pyelonephritis, Adult Pyelonephritis is a kidney infection. The kidneys are the organs that filter a person's blood and move waste out of the bloodstream and into the urine. Urine passes from the kidneys, through the ureters, and into the bladder. There are two main types of pyelonephritis:  Infections that come on quickly without any warning (acute pyelonephritis).  Infections that last for a long period of time (chronic pyelonephritis).  In most cases, the infection clears up with treatment and does not cause further problems. More severe infections or chronic infections can sometimes spread to the bloodstream or lead to other problems with the kidneys. What are the causes? This condition is usually caused by:  Bacteria traveling from the bladder to the kidney through infected urine. The urine in the bladder can become infected with bacteria from: ? Bladder infection (cystitis). ? Inflammation of the prostate gland (prostatitis). ? Sexual intercourse, in females.  Bacteria traveling from the bloodstream to the kidney.  What increases the risk? This condition is more likely to develop in:  Pregnant women.  Older people.  People who have diabetes.  People who have kidney stones or bladder stones.  People who have other abnormalities of the kidney or ureter.  People who have a catheter placed in the bladder.  People who have cancer.  People who are sexually active.  Women who use spermicides.  People who have had a prior urinary tract infection.  What are the signs or symptoms? Symptoms of this condition include:  Frequent urination.  Strong or persistent urge to urinate.  Burning or stinging when urinating.  Abdominal pain.  Back pain.  Pain in the side or flank area.  Fever.  Chills.  Blood in the urine, or dark urine.  Nausea.  Vomiting.  How is this diagnosed? This condition may be diagnosed based on:  Medical history and physical exam.  Urine  tests.  Blood tests.  You may also have imaging tests of the kidneys, such as an ultrasound or CT scan. How is this treated? Treatment for this condition may depend on the severity of the infection.  If the infection is mild and is found early, you may be treated with antibiotic medicines taken by mouth. You will need to drink fluids to remain hydrated.  If the infection is more severe, you may need to stay in the hospital and receive antibiotics given directly into a vein through an IV tube. You may also need to receive fluids through an IV tube if you are not able to remain hydrated. After your hospital stay, you may need to take oral antibiotics for a period of time.  Other treatments may be required, depending on the cause of the infection. Follow these instructions at home: Medicines  Take over-the-counter and prescription medicines only as told by your health care provider.  If you were prescribed an antibiotic medicine, take it as told by your health care provider. Do not stop taking the antibiotic even if you start to feel better. General instructions  Drink enough fluid to keep your urine clear or pale yellow.  Avoid caffeine, tea, and carbonated beverages. They tend to irritate the bladder.  Urinate often. Avoid holding in urine for long periods of time.  Urinate before and after sex.  After a bowel movement, women should cleanse from front to back. Use each tissue only once.  Keep all follow-up visits as told by your health care provider. This is important. Contact a health care provider if:  Your symptoms   do not get better after 2 days of treatment.  Your symptoms get worse.  You have a fever. Get help right away if:  You are unable to take your antibiotics or fluids.  You have shaking chills.  You vomit.  You have severe flank or back pain.  You have extreme weakness or fainting. This information is not intended to replace advice given to you by your  health care provider. Make sure you discuss any questions you have with your health care provider. Document Released: 11/03/2005 Document Revised: 04/10/2016 Document Reviewed: 02/26/2015 [employer] Interactive Patient Education  2018 [employer]  

## 2017-08-02 NOTE — Care Management Obs Status (Signed)
[facility] NOTIFICATION   Patient Details  Name: Timothy Gallagher MRN: [id number] Date of Birth: February 06, 1936   Medicare Observation Status Notification Given:  Yes    Erenest Rasher, RN 08/02/2017, 10:30 AM

## 2017-08-02 NOTE — Discharge Summary (Signed)
Physician Discharge Summary  CHAUNCY MANGIARACINA IEP:[id number] DOB: November 08, 1936 DOA: 07/31/2017  PCP: [name], MD  Admit date: 07/31/2017 Discharge date: 08/02/2017  Admitted From: Home Disposition:  Home  Recommendations for Outpatient Follow-up:  1. Follow up with PCP in 1 week 2. Please follow up on the following pending results: final blood culture result, negative at time of discharge   Discharge Condition: Stable, improved CODE STATUS: Full  Diet recommendation: Heart healthy   Brief/Interim Summary: Shaquon A Crowderis a 81 y.o.malewith medical history significant of hypertension, coronary artery disease status post CABG, hyperlipidemia, presents to the emergency room from his PCPs office with fever and chills. Patient has seen his PCP on 9/12 with subjective fever and chills as well as dysuria. He was diagnosed with a UTI and was empirically given ceftriaxone IM followed by Keflex at home. Patient reports that he has been taking his antibiotics, however has been having persistent fever and chills over the last 2 days and started having uncontrollable shaking last night. He was seen again by his PCP, and he was felt to be septic and sent to the emergency room for further evaluation. Patient has been complaining of rigors, fevers at home. He is also had nausea with several episodes of vomiting.  He also had an episode where describing that he is trying to pee but cannot. He is also been complaining of having confusion and this morning when he woke up he tells me he has not recognized his wife of 40 years initially. He was admitted for acute pyelonephritis and started on empiric IV antibiotics.   Discharge Diagnoses:  Active Problems:   Hyperlipidemia   Essential hypertension   CAD (coronary artery disease) of artery bypass graft   Sepsis secondary to UTI ([place])   Acute pyelonephritis   Thrombocytopenia ([place])   Sepsis ([place])  Sepsis secondary to pyelonephritis -Urine  culture from PCP office (9/12) showed E Coli, sensitivity resulted. Will deescalate antibiotics to vantin.  -Blood culture negative to date -Renal ultrasound with renal cysts, no acute abnormalities   Essential hypertension -Continue Toprol. Blood pressure stable  CAD status post CABG -Daily aspirin, lipitor   Acute toxic encephalopathy -Had an episode of forgetting, not recognizing his wife. In setting of infection as above. Now resolved.    Discharge Instructions  Discharge Instructions    Call MD for:  difficulty breathing, headache or visual disturbances    Complete by:  As directed    Call MD for:  extreme fatigue    Complete by:  As directed    Call MD for:  hives    Complete by:  As directed    Call MD for:  persistant dizziness or light-headedness    Complete by:  As directed    Call MD for:  persistant nausea and vomiting    Complete by:  As directed    Call MD for:  severe uncontrolled pain    Complete by:  As directed    Call MD for:  temperature >100.4    Complete by:  As directed    Diet - low sodium heart healthy    Complete by:  As directed    Discharge instructions    Complete by:  As directed    You were cared for by a hospitalist during your hospital stay. If you have any questions about your discharge medications or the care you received while you were in the hospital after you are discharged, you can call the unit and  asked to speak with the hospitalist on call if the hospitalist that took care of you is not available. Once you are discharged, your primary care physician will handle any further medical issues. Please note that NO REFILLS for any discharge medications will be authorized once you are discharged, as it is imperative that you return to your primary care physician (or establish a relationship with a primary care physician if you do not have one) for your aftercare needs so that they can reassess your need for medications and monitor your lab  values.   Increase activity slowly    Complete by:  As directed      Allergies as of 08/02/2017      Reactions   Lipitor [atorvastatin] Other (See Comments)   REACTION: nausea and blurred vision   Trazodone And Nefazodone Other (See Comments)   dizzy   Ciprofloxacin Swelling   Mycophenolate Mofetil Other (See Comments)   REACTION: unspecified   Amoxicillin Rash   Has patient had a PCN reaction causing immediate rash, facial/tongue/throat swelling, SOB or lightheadedness with hypotension: Unknown Has patient had a PCN reaction causing severe rash involving mucus membranes or skin necrosis: Unknown Has patient had a PCN reaction that required hospitalization: Unknown Has patient had a PCN reaction occurring within the last 10 years: Unknown If all of the above answers are "NO", then may proceed with Cephalosporin use.   Penicillins Rash   Has patient had a PCN reaction causing immediate rash, facial/tongue/throat swelling, SOB or lightheadedness with hypotension: Unknown Has patient had a PCN reaction causing severe rash involving mucus membranes or skin necrosis: Unknown Has patient had a PCN reaction that required hospitalization: Unknown Has patient had a PCN reaction occurring within the last 10 years: Unknown If all of the above answers are "NO", then may proceed with Cephalosporin use.   Rosuvastatin Other (See Comments)   Unknown       Medication List    STOP taking these medications   cephALEXin 500 MG capsule Commonly known as:  KEFLEX     TAKE these medications   acetaminophen 500 MG tablet Commonly known as:  TYLENOL Take 500 mg by mouth daily as needed (for pain). Reported on 04/11/2016   aspirin 81 MG EC tablet Take 1 tablet (81 mg total) by mouth daily.   atorvastatin 20 MG tablet Commonly known as:  LIPITOR Take 1 tablet (20 mg total) by mouth daily.   cefpodoxime 200 MG tablet Commonly known as:  VANTIN Take 1 tablet (200 mg total) by mouth every 12  (twelve) hours.   CENTRUM SILVER PO Take 1 tablet by mouth daily.   cetirizine 10 MG tablet Commonly known as:  ZYRTEC Take 10 mg by mouth daily as needed for allergies.   cyanocobalamin 1000 MCG/ML injection Commonly known as:  (VITAMIN B-12) Inject 1,000 mcg into the muscle every 30 (thirty) days.   fluticasone 50 MCG/ACT nasal spray Commonly known as:  FLONASE USE 1 TO 2 SPRAYS IN EACH NOSTRIL DAILY AS NEEDED FOR ALLERGIES   metoprolol succinate 25 MG 24 hr tablet Commonly known as:  TOPROL-XL Take 0.5 tablets (12.5 mg total) by mouth daily.   zolpidem 5 MG tablet Commonly known as:  AMBIEN TAKE 1 TABLET BY MOUTH AT BEDTIME AS NEEDED FOR SLEEP            Discharge Care Instructions        Start     Ordered   08/02/17 0000  cefpodoxime (VANTIN) 200  MG tablet  Every 12 hours     08/02/17 0916   08/02/17 0000  Increase activity slowly     08/02/17 0916   08/02/17 0000  Diet - low sodium heart healthy     08/02/17 0916   08/02/17 0000  Discharge instructions    Comments:  You were cared for by a hospitalist during your hospital stay. If you have any questions about your discharge medications or the care you received while you were in the hospital after you are discharged, you can call the unit and asked to speak with the hospitalist on call if the hospitalist that took care of you is not available. Once you are discharged, your primary care physician will handle any further medical issues. Please note that NO REFILLS for any discharge medications will be authorized once you are discharged, as it is imperative that you return to your primary care physician (or establish a relationship with a primary care physician if you do not have one) for your aftercare needs so that they can reassess your need for medications and monitor your lab values.   08/02/17 0916   08/02/17 0000  Call MD for:  extreme fatigue     08/02/17 0916   08/02/17 0000  Call MD for:  persistant dizziness  or light-headedness     08/02/17 0916   08/02/17 0000  Call MD for:  hives     08/02/17 0916   08/02/17 0000  Call MD for:  difficulty breathing, headache or visual disturbances     08/02/17 0916   08/02/17 0000  Call MD for:  severe uncontrolled pain     08/02/17 0916   08/02/17 0000  Call MD for:  persistant nausea and vomiting     08/02/17 0916   08/02/17 0000  Call MD for:  temperature >100.4     08/02/17 0916     Follow-up Information    [name], MD. Go on 08/06/2017.   Specialty:  Family Medicine Contact information: [place] [address] [phone]          Allergies  Allergen Reactions  . Lipitor [Atorvastatin] Other (See Comments)    REACTION: nausea and blurred vision  . Trazodone And Nefazodone Other (See Comments)    dizzy  . Ciprofloxacin Swelling  . Mycophenolate Mofetil Other (See Comments)    REACTION: unspecified  . Amoxicillin Rash    Has patient had a PCN reaction causing immediate rash, facial/tongue/throat swelling, SOB or lightheadedness with hypotension: Unknown Has patient had a PCN reaction causing severe rash involving mucus membranes or skin necrosis: Unknown Has patient had a PCN reaction that required hospitalization: Unknown Has patient had a PCN reaction occurring within the last 10 years: Unknown If all of the above answers are "NO", then may proceed with Cephalosporin use.   [name] Kitchen Penicillins Rash    Has patient had a PCN reaction causing immediate rash, facial/tongue/throat swelling, SOB or lightheadedness with hypotension: Unknown Has patient had a PCN reaction causing severe rash involving mucus membranes or skin necrosis: Unknown Has patient had a PCN reaction that required hospitalization: Unknown Has patient had a PCN reaction occurring within the last 10 years: Unknown If all of the above answers are "NO", then may proceed with Cephalosporin use.   . Rosuvastatin Other (See Comments)    Unknown      Consultations:  None   Procedures/Studies: US Renal  Result Date: 08/01/2017 CLINICAL DATA:  Flank pain EXAM: RENAL /  URINARY TRACT ULTRASOUND COMPLETE COMPARISON:  None. FINDINGS: Right Kidney: Length: 10.8 cm. Cysts are seen in the right kidney with the largest measuring 3.4 cm. Left Kidney: Length: 11.2 cm. A cyst measuring 2.3 cm is seen in the left kidney. Bladder: Appears normal for degree of bladder distention. IMPRESSION: Cystic changes in the kidneys.  No acute abnormalities. Electronically Signed   By: Dorise Bullion III M.D   On: 08/01/2017 10:43       Discharge Exam: Vitals:   08/01/17 2007 08/02/17 0517  BP: (!) 143/90 (!) 136/55  Pulse: 75 78  Resp: 20 20  Temp: 98 F (36.7 C) 98.3 F (36.8 C)  SpO2: 99% 97%   Vitals:   08/01/17 0447 08/01/17 1251 08/01/17 2007 08/02/17 0517  BP: 130/67 (!) 152/86 (!) 143/90 (!) 136/55  Pulse: 79 (!) 105 75 78  Resp: 20 18 20 20   Temp: 98.6 F (37 C) 99.1 F (37.3 C) 98 F (36.7 C) 98.3 F (36.8 C)  TempSrc: Oral Oral Oral Oral  SpO2: 97%  99% 97%  Weight:      Height:        General: Pt is alert, awake, not in acute distress Cardiovascular: RRR, S1/S2 +, no rubs, no gallops Respiratory: CTA bilaterally, no wheezing, no rhonchi Abdominal: Soft, NT, ND, bowel sounds + Extremities: no edema, no cyanosis    The results of significant diagnostics from this hospitalization (including imaging, microbiology, ancillary and laboratory) are listed below for reference.     Microbiology: Recent Results (from the past 240 hour(s))  Urine Culture     Status: Abnormal   Collection Time: 07/29/17  9:30 AM  Result Value Ref Range Status   MICRO NUMBER: [id number]  Final   SPECIMEN QUALITY: ADEQUATE  Final   Sample Source URINE  Final   STATUS: FINAL  Final   ISOLATE 1: Escherichia coli (A)  Final      Susceptibility   Escherichia coli - URINE CULTURE, REFLEX    AMOX/CLAVULANIC <=2 Sensitive     AMPICILLIN <=2  Sensitive     AMPICILLIN/SULBACTAM <=2 Sensitive     CEFAZOLIN* <=4 Not Reportable      * For infections other than uncomplicated UTIcaused by E. coli, K. pneumoniae or P. mirabilis:Cefazolin is resistant if MIC > or = 8 mcg/mL.(Distinguishing susceptible versus intermediatefor isolates with MIC < or = 4 mcg/mL requiresadditional testing.)For uncomplicated UTI caused by E. coli,K. pneumoniae or P. mirabilis: Cefazolin issusceptible if MIC <32 mcg/mL and predictssusceptible to the oral agents cefaclor, cefdinir,cefpodoxime, cefprozil, cefuroxime, cephalexinand loracarbef.    CEFEPIME <=1 Sensitive     CEFTRIAXONE <=1 Sensitive     CIPROFLOXACIN <=0.25 Sensitive     LEVOFLOXACIN <=0.12 Sensitive     ERTAPENEM <=0.5 Sensitive     GENTAMICIN <=1 Sensitive     IMIPENEM <=0.25 Sensitive     NITROFURANTOIN <=16 Sensitive     PIP/TAZO <=4 Sensitive     TOBRAMYCIN <=1 Sensitive     TRIMETH/SULFA* <=20 Sensitive      * For infections other than uncomplicated UTIcaused by E. coli, K. pneumoniae or P. mirabilis:Cefazolin is resistant if MIC > or = 8 mcg/mL.(Distinguishing susceptible versus intermediatefor isolates with MIC < or = 4 mcg/mL requiresadditional testing.)For uncomplicated UTI caused by E. coli,K. pneumoniae or P. mirabilis: Cefazolin issusceptible if MIC <32 mcg/mL and predictssusceptible to the oral agents cefaclor, cefdinir,cefpodoxime, cefprozil, cefuroxime, cephalexinand loracarbef.Legend:S = Susceptible  I = IntermediateR = Resistant  NS = Not susceptible* =  Not tested  NR = Not reported**NN = See antimicrobic comments  Blood Culture (routine x 2)     Status: None (Preliminary result)   Collection Time: 07/31/17 12:44 PM  Result Value Ref Range Status   Specimen Description BLOOD LEFT HAND  Final   Special Requests IN PEDIATRIC BOTTLE Blood Culture adequate volume  Final   Culture   Final    NO GROWTH 2 DAYS Performed at [facility], [place] [address], [place], [place] [address]     Report Status PENDING  Incomplete  Urine culture     Status: None   Collection Time: 07/31/17  2:40 PM  Result Value Ref Range Status   Specimen Description URINE, RANDOM  Final   Special Requests NONE  Final   Culture   Final    NO GROWTH Performed at [facility], [address], [place], [place] [address]    Report Status 08/01/2017 FINAL  Final  Blood Culture (routine x 2)     Status: None (Preliminary result)   Collection Time: 07/31/17  6:41 PM  Result Value Ref Range Status   Specimen Description BLOOD LEFT ARM  Final   Special Requests IN PEDIATRIC BOTTLE Blood Culture adequate volume  Final   Culture   Final    NO GROWTH 2 DAYS Performed at [facility], [place] [address] [place], [place] [address]    Report Status PENDING  Incomplete     Labs: BNP (last 3 results) No results for input(s): BNP in the last 8760 hours. Basic Metabolic Panel:  Recent Labs Lab 07/31/17 1244 08/01/17 0458 08/02/17 0443  NA 136 140 140  K 4.0 4.2 3.7  CL 106 111 110  CO2 21* 24 21*  GLUCOSE 141* 113* 111*  BUN 29* 21* 14  CREATININE 1.06 0.86 0.82  CALCIUM 8.3* 8.0* 8.5*   Liver Function Tests:  Recent Labs Lab 08/01/17 0458  AST 47*  ALT 27  ALKPHOS 56  BILITOT 0.9  PROT 5.6*  ALBUMIN 2.4*   No results for input(s): LIPASE, AMYLASE in the last 168 hours. No results for input(s): AMMONIA in the last 168 hours. CBC:  Recent Labs Lab 07/31/17 1244 08/01/17 0458 08/02/17 0443  WBC 6.0 5.3 6.0  NEUTROABS 5.0  --  4.0  HGB 11.7* 10.8* 11.2*  HCT 34.6* 31.6* 33.9*  MCV 81.4 82.5 82.1  PLT 97* 91* 119*   Cardiac Enzymes: No results for input(s): CKTOTAL, CKMB, CKMBINDEX, TROPONINI in the last 168 hours. BNP: Invalid input(s): POCBNP CBG: No results for input(s): GLUCAP in the last 168 hours. D-Dimer No results for input(s): DDIMER in the last 72 hours. Hgb A1c No results for input(s): HGBA1C in the last 72 hours. Lipid Profile No results for  input(s): CHOL, HDL, LDLCALC, TRIG, CHOLHDL, LDLDIRECT in the last 72 hours. Thyroid function studies No results for input(s): TSH, T4TOTAL, T3FREE, THYROIDAB in the last 72 hours.  Invalid input(s): FREET3 Anemia work up No results for input(s): VITAMINB12, FOLATE, FERRITIN, TIBC, IRON, RETICCTPCT in the last 72 hours. Urinalysis    Component Value Date/Time   COLORURINE AMBER (A) 07/31/2017 1440   APPEARANCEUR HAZY (A) 07/31/2017 1440   LABSPEC 1.022 07/31/2017 1440   PHURINE 5.0 07/31/2017 1440   GLUCOSEU NEGATIVE 07/31/2017 1440   GLUCOSEU NEGATIVE 11/03/2007 1058   HGBUR MODERATE (A) 07/31/2017 1440   BILIRUBINUR NEGATIVE 07/31/2017 1440   BILIRUBINUR 1+ 07/29/2017 0923   KETONESUR NEGATIVE 07/31/2017 1440  PROTEINUR >=300 (A) 07/31/2017 1440   UROBILINOGEN 0.2 07/29/2017 0923   UROBILINOGEN 0.2 05/17/2014 1158   NITRITE NEGATIVE 07/31/2017 1440   LEUKOCYTESUR TRACE (A) 07/31/2017 1440   Sepsis Labs Invalid input(s): PROCALCITONIN,  WBC,  LACTICIDVEN Microbiology Recent Results (from the past 240 hour(s))  Urine Culture     Status: Abnormal   Collection Time: 07/29/17  9:30 AM  Result Value Ref Range Status   MICRO NUMBER: [id number]  Final   SPECIMEN QUALITY: ADEQUATE  Final   Sample Source URINE  Final   STATUS: FINAL  Final   ISOLATE 1: Escherichia coli (A)  Final      Susceptibility   Escherichia coli - URINE CULTURE, REFLEX    AMOX/CLAVULANIC <=2 Sensitive     AMPICILLIN <=2 Sensitive     AMPICILLIN/SULBACTAM <=2 Sensitive     CEFAZOLIN* <=4 Not Reportable      * For infections other than uncomplicated UTIcaused by E. coli, K. pneumoniae or P. mirabilis:Cefazolin is resistant if MIC > or = 8 mcg/mL.(Distinguishing susceptible versus intermediatefor isolates with MIC < or = 4 mcg/mL requiresadditional testing.)For uncomplicated UTI caused by E. coli,K. pneumoniae or P. mirabilis: Cefazolin issusceptible if MIC <32 mcg/mL and predictssusceptible to the oral agents  cefaclor, cefdinir,cefpodoxime, cefprozil, cefuroxime, cephalexinand loracarbef.    CEFEPIME <=1 Sensitive     CEFTRIAXONE <=1 Sensitive     CIPROFLOXACIN <=0.25 Sensitive     LEVOFLOXACIN <=0.12 Sensitive     ERTAPENEM <=0.5 Sensitive     GENTAMICIN <=1 Sensitive     IMIPENEM <=0.25 Sensitive     NITROFURANTOIN <=16 Sensitive     PIP/TAZO <=4 Sensitive     TOBRAMYCIN <=1 Sensitive     TRIMETH/SULFA* <=20 Sensitive      * For infections other than uncomplicated UTIcaused by E. coli, K. pneumoniae or P. mirabilis:Cefazolin is resistant if MIC > or = 8 mcg/mL.(Distinguishing susceptible versus intermediatefor isolates with MIC < or = 4 mcg/mL requiresadditional testing.)For uncomplicated UTI caused by E. coli,K. pneumoniae or P. mirabilis: Cefazolin issusceptible if MIC <32 mcg/mL and predictssusceptible to the oral agents cefaclor, cefdinir,cefpodoxime, cefprozil, cefuroxime, cephalexinand loracarbef.Legend:S = Susceptible  I = IntermediateR = Resistant  NS = Not susceptible* = Not tested  NR = Not reported**NN = See antimicrobic comments  Blood Culture (routine x 2)     Status: None (Preliminary result)   Collection Time: 07/31/17 12:44 PM  Result Value Ref Range Status   Specimen Description BLOOD LEFT HAND  Final   Special Requests IN PEDIATRIC BOTTLE Blood Culture adequate volume  Final   Culture   Final    NO GROWTH 2 DAYS Performed at [facility], [place] [address] [place], [place] [address]    Report Status PENDING  Incomplete  Urine culture     Status: None   Collection Time: 07/31/17  2:40 PM  Result Value Ref Range Status   Specimen Description URINE, RANDOM  Final   Special Requests NONE  Final   Culture   Final    NO GROWTH Performed at [facility], [address] [place], [place] [address]    Report Status 08/01/2017 FINAL  Final  Blood Culture (routine x 2)     Status: None (Preliminary result)   Collection Time: 07/31/17  6:41 PM  Result Value Ref Range  Status   Specimen Description BLOOD LEFT ARM  Final   Special Requests IN PEDIATRIC BOTTLE Blood Culture adequate volume  Final   Culture  Final    NO GROWTH 2 DAYS Performed at [facility], [place] [address], [place], [place] [address]    Report Status PENDING  Incomplete     Time coordinating discharge: 40 minutes  SIGNED:  Dessa Phi, DO [facility] Pager [phone]  If 7PM-7AM, please contact night-coverage [email or web address] Password [facility] 08/02/2017, 11:54 AM

## 2017-08-02 NOTE — Care Management CC44 (Signed)
Condition Code 44 Documentation Completed  Patient Details  Name: CUSTER PIMENTA MRN: [id number] Date of Birth: Jan 17, 1936   Condition Code 44 given:  Yes Patient signature on Condition Code 44 notice:  Yes Documentation of 2 MD's agreement:  Yes Code 44 added to claim:  Yes    Erenest Rasher, RN 08/02/2017, 10:30 AM

## 2017-08-02 NOTE — Progress Notes (Signed)
Pt discharged to home, instruction reviewed with pt and spouse, acknowledge understanding. {t in stable condition. SRP,RN

## 2017-08-03 DIAGNOSIS — Z09 Encounter for follow-up examination after completed treatment for conditions other than malignant neoplasm: Secondary | ICD-10-CM | POA: Diagnosis not present

## 2017-08-03 DIAGNOSIS — H3342 Traction detachment of retina, left eye: Secondary | ICD-10-CM | POA: Diagnosis not present

## 2017-08-03 DIAGNOSIS — H33022 Retinal detachment with multiple breaks, left eye: Secondary | ICD-10-CM | POA: Diagnosis not present

## 2017-08-04 ENCOUNTER — Telehealth: Payer: Self-pay | Admitting: *Deleted

## 2017-08-04 DIAGNOSIS — Z87440 Personal history of urinary (tract) infections: Secondary | ICD-10-CM | POA: Diagnosis not present

## 2017-08-04 DIAGNOSIS — R8271 Bacteriuria: Secondary | ICD-10-CM | POA: Diagnosis not present

## 2017-08-04 LAB — BLOOD CULTURE ID PANEL (REFLEXED)

## 2017-08-04 NOTE — Telephone Encounter (Signed)
Transition Care Management Follow-up Telephone Call  Per Discharge Summary: Admit date: 07/31/2017 Discharge date: 08/02/2017  Admitted From: Home Disposition:  Home  Recommendations for Outpatient Follow-up:  1. Follow up with PCP in 1 week 2. Please follow up on the following pending results: final blood culture result, negative at time of discharge   Discharge Condition: Stable, improved CODE STATUS: Full  Diet recommendation: Heart healthy  --   How have you been since you were released from the hospital? "I feel like I've been shot 3 times." Patient reports continued urinary frequency, decreased appetite, fatigue, and weakness since discharge. He does not feel worse than when he left the hospital, but does not feel back to baseline. Encouraged him to stay hydrated, take atbx as directed, and rest.   Do you understand why you were in the hospital? yes   Do you understand the discharge instructions? yes   Where were you discharged to? Home   Items Reviewed:  Medications reviewed: yes  Allergies reviewed: yes  Dietary changes reviewed: yes  Referrals reviewed: no, none made   Functional Questionnaire:   Activities of Daily Living (ADLs):   He states they are independent in the following: ambulation, bathing and hygiene, feeding, continence, grooming, toileting and dressing States they require assistance with the following: none   Any transportation issues/concerns?: no   Any patient concerns? no   Confirmed importance and date/time of follow-up visits scheduled yes  Provider Appointment booked with [name] 08/06/17 @ 11:15am  Confirmed with patient if condition begins to worsen call PCP or go to the ER.  Patient was given the office number and encouraged to call back with question or concerns.  : yes

## 2017-08-04 NOTE — Telephone Encounter (Signed)
Noted thanks °

## 2017-08-04 NOTE — Telephone Encounter (Signed)
Unable to reach patient at time of TCM Call. Left message for patient to return call when available.  

## 2017-08-04 NOTE — Progress Notes (Signed)
   Called by microbiology lab this morning. Patient's 1 of 2 blood culture is positive for gram positive cocci in clusters. BCID is pending. Second bottle is negative to date. This likely represents a contaminant.    Dessa Phi, DO [facility] [email or web address] Password [facility] 08/04/2017, 7:46 AM

## 2017-08-05 LAB — CULTURE, BLOOD (ROUTINE X 2)
Culture: NO GROWTH
Special Requests: ADEQUATE

## 2017-08-06 ENCOUNTER — Encounter: Payer: Self-pay | Admitting: Family Medicine

## 2017-08-06 ENCOUNTER — Ambulatory Visit (INDEPENDENT_AMBULATORY_CARE_PROVIDER_SITE_OTHER): Payer: Medicare Other | Admitting: Family Medicine

## 2017-08-06 VITALS — BP 132/72 | HR 66 | Temp 97.9°F | Ht 73.0 in | Wt 194.2 lb

## 2017-08-06 DIAGNOSIS — R351 Nocturia: Secondary | ICD-10-CM

## 2017-08-06 DIAGNOSIS — N401 Enlarged prostate with lower urinary tract symptoms: Secondary | ICD-10-CM

## 2017-08-06 DIAGNOSIS — N12 Tubulo-interstitial nephritis, not specified as acute or chronic: Secondary | ICD-10-CM

## 2017-08-06 DIAGNOSIS — I10 Essential (primary) hypertension: Secondary | ICD-10-CM | POA: Diagnosis not present

## 2017-08-06 DIAGNOSIS — E538 Deficiency of other specified B group vitamins: Secondary | ICD-10-CM

## 2017-08-06 DIAGNOSIS — I2510 Atherosclerotic heart disease of native coronary artery without angina pectoris: Secondary | ICD-10-CM

## 2017-08-06 DIAGNOSIS — E785 Hyperlipidemia, unspecified: Secondary | ICD-10-CM

## 2017-08-06 LAB — CULTURE, BLOOD (ROUTINE X 2): Special Requests: ADEQUATE

## 2017-08-06 MED ORDER — CYANOCOBALAMIN 1000 MCG/ML IJ SOLN
1000.0000 ug | Freq: Once | INTRAMUSCULAR | Status: AC
  Administered 2017-08-06: 1000 ug via INTRAMUSCULAR

## 2017-08-06 MED ORDER — CEFDINIR 300 MG PO CAPS: 300.0000 mg | ORAL_CAPSULE | Freq: Two times a day (BID) | ORAL | 0 refills | Status: DC

## 2017-08-06 NOTE — Progress Notes (Signed)
Subjective:  Timothy Gallagher is a 81 y.o. year old very pleasant male patient who presents for transitional care management and hospital follow up for sepsis from pyelonephritis. Patient was hospitalized from 07/31/17 to . 08/02/17 A TCM phone call was completed on 08/04/17. Medical complexity moderate   Timothy Gallagher is a 81 year old male with CAD s/p CABG, HLD, HTN, BPH who presented to the ER after being found in office to have rigors, hypotension despite 2 days of antibiotic treatment for outpatient UTI- he was found to be septic in ER ultimately attributed to pyelonephritis. Before hospitalization was given IM ceftriaxone and keflex- 2 days later returned with rigors and hypotension. Had some confusion at home which was short term- could not recall who his wife was. Concern was for sepsis and sent to the ER. In ER noted to be febrile to 103 unfortunately and hypotensive- responded well to fluid bolus and IV antibiotics. Outpatient urine culture showed E. Coli which was pansensitive. He was sent home on vantin but had to be changed to omnicef due to expense. Plan originally was for 10 days outpatient but with new rx only given 7 days. We agreed today to extend another 3 days. Had blood cultures- 1 which was negative and other showing likely contaminate- micrococcus. Had renal ultrasound with renal cysts but no acute abnormalities. Due to confusion- technically had acute toxic encephalopathy- this fortunately resolved.   HTN- initially toprol was held outpatient- was able to be restarted on discharge  BPH- due to continued orthostatic issues had tried to hold flomax- he did not tolerate this and was restarted. Possible this contributed to initial UTI.   CAD s/p CABG- continued on aspirin and lipitor- was asymptomatic.    See problem oriented charting as well ROS- states still fatigued but much improved, no rigors, no fevers. No headache or blurry vision. Tolerating antibiotic and compliant.    Past  Medical History-  Patient Active Problem List   Diagnosis Date Noted  . Detached retina, left 06/30/2017    Priority: High  . AAA (abdominal aortic aneurysm) ([place]) 05/12/2017    Priority: High  . CAD (coronary artery disease) of artery bypass graft 05/19/2014    Priority: High  . BPPV (benign paroxysmal positional vertigo) 09/01/2016    Priority: Medium  . Insomnia 11/22/2014    Priority: Medium  . Anemia 07/20/2014    Priority: Medium  . BPH (benign prostatic hyperplasia) 12/05/2013    Priority: Medium  . Hyperlipidemia 03/13/2008    Priority: Medium  . B12 deficiency 06/07/2007    Priority: Medium  . Essential hypertension 04/09/2007    Priority: Medium  . Left sided abdominal pain 04/15/2017    Priority: Low  . Low back pain 02/06/2017    Priority: Low  . Internal and external bleeding hemorrhoids 01/22/2015    Priority: Low  . Palpitations 02/11/2013    Priority: Low  . OSTEOARTHRITIS, WRIST, RIGHT 08/05/2010    Priority: Low  . ACNE ROSACEA 06/27/2009    Priority: Low  . ESOPHAGEAL STRICTURE 04/27/2009    Priority: Low  . ACTINIC KERATOSIS, HEAD 04/18/2009    Priority: Low  . NECK PAIN 09/14/2008    Priority: Low  . NEUROPATHY, IDIOPATHIC PERIPHERAL NEC 08/11/2007    Priority: Low  . Irritable bowel syndrome 08/11/2007    Priority: Low  . ALLERGIC RHINITIS 04/09/2007    Priority: Low  . Thrombocytopenia ([place]) 07/31/2017  . Anal fissure 04/07/2016    Medications- reviewed and updated  A medical reconciliation was performed comparing current medicines to hospital discharge medications. Current Outpatient Prescriptions  Medication Sig Dispense Refill  . acetaminophen (TYLENOL) 500 MG tablet Take 500 mg by mouth daily as needed (for pain). Reported on 04/11/2016    . aspirin 81 MG EC tablet Take 1 tablet (81 mg total) by mouth daily.    . cefdinir (OMNICEF) 300 MG capsule Take 1 capsule (300 mg total) by mouth 2 (two) times daily. 6 capsule 0  . cetirizine  (ZYRTEC) 10 MG tablet Take 10 mg by mouth daily as needed for allergies.     . cyanocobalamin (,VITAMIN B-12,) 1000 MCG/ML injection Inject 1,000 mcg into the muscle every 30 (thirty) days.    . fluticasone (FLONASE) 50 MCG/ACT nasal spray USE 1 TO 2 SPRAYS IN EACH NOSTRIL DAILY AS NEEDED FOR ALLERGIES 16 g 3  . metoprolol succinate (TOPROL-XL) 25 MG 24 hr tablet Take 0.5 tablets (12.5 mg total) by mouth daily. 46 tablet 3  . Multiple Vitamins-Minerals (CENTRUM SILVER PO) Take 1 tablet by mouth daily.    [name] Kitchen zolpidem (AMBIEN) 5 MG tablet TAKE 1 TABLET BY MOUTH AT BEDTIME AS NEEDED FOR SLEEP 30 tablet 5  . atorvastatin (LIPITOR) 20 MG tablet Take 1 tablet (20 mg total) by mouth daily. 90 tablet 3   No current facility-administered medications for this visit.     Objective: BP 132/72   Pulse 66   Temp 97.9 F (36.6 C) (Oral)   Ht 6\' 1"  (1.854 m)   Wt 194 lb 3.2 oz (88.1 kg)   SpO2 97%   BMI 25.62 kg/m  Gen: NAD, resting comfortably CV: RRR no murmurs rubs or gallops Lungs: CTAB no crackles, wheeze, rhonchi Abdomen: soft/nontender/nondistended/normal bowel sounds. No rebound or guarding.  No suprapubic or cva tenderness Ext: no edema Skin: warm, dry Neuro:  Normal gait- walks much improved  Assessment/Plan:  Pyelonephritis. Sepsis from pyelo. E. Coli pan sesnitive. IV abx but discharged on vantin for 10 days. This was too expensive so changed to [facility] but was only given 7 days. We added 3 days more for total of 10 days outpatient and closer to 14 days overall. BPH- being off of flomax may have contributed UTI development so will resume this medication despite orthostatic symptoms. May have cystoscopy with urologist per their notes.   Essential hypertension- continue back on metoprolol- had held before with hypotension  Coronary artery disease involving native coronary artery of native heart without angina pectoris- asymptomatic, continue aspirin and statin  Hyperlipidemia,  unspecified hyperlipidemia type- continue statin.   Low vitamin B12 level - Plan: cyanocobalamin ((VITAMIN B-12)) injection 1,000 mcg. Given his monthly b12 injection today.   Meds ordered this encounter  Medications  . DISCONTD: cefdinir (OMNICEF) 300 MG capsule    Sig: Take 1 capsule by mouth 2 (two) times daily.    Refill:  0  . cefdinir (OMNICEF) 300 MG capsule    Sig: Take 1 capsule (300 mg total) by mouth 2 (two) times daily.    Dispense:  6 capsule    Refill:  0  . cyanocobalamin ((VITAMIN B-12)) injection 1,000 mcg   Return precautions advised. PRN return precautions given  [name], MD

## 2017-08-06 NOTE — Patient Instructions (Signed)
Glad you are feeling so much better. Lets extend the antibiotics for 3 more days  Any feelings going in the opposite direction -get back to see [place]

## 2017-08-09 NOTE — Assessment & Plan Note (Signed)
BPH- being off of flomax may have contributed UTI development so will resume this medication despite orthostatic symptoms.

## 2017-08-13 ENCOUNTER — Ambulatory Visit: Payer: Medicare Other | Admitting: Family Medicine

## 2017-08-17 ENCOUNTER — Encounter: Payer: Self-pay | Admitting: Family Medicine

## 2017-08-17 ENCOUNTER — Ambulatory Visit (INDEPENDENT_AMBULATORY_CARE_PROVIDER_SITE_OTHER): Payer: Medicare Other | Admitting: Family Medicine

## 2017-08-17 VITALS — BP 118/88 | HR 67 | Temp 98.0°F | Ht 72.0 in | Wt 189.8 lb

## 2017-08-17 DIAGNOSIS — R351 Nocturia: Secondary | ICD-10-CM

## 2017-08-17 DIAGNOSIS — N401 Enlarged prostate with lower urinary tract symptoms: Secondary | ICD-10-CM

## 2017-08-17 NOTE — Patient Instructions (Addendum)
Drop urine off before you leave.   Start flomax back on a daily basis to see if we can help nighttime symptoms. There is also one other medicine that can shrink your prostate which Im considering.

## 2017-08-17 NOTE — Progress Notes (Signed)
Subjective:  Timothy Gallagher is a 81 y.o. year old very pleasant male patient who presents for/with See problem oriented charting ROS- no fever, chills, nausea, vomiting, rigors.    Past Medical History-  Patient Active Problem List   Diagnosis Date Noted  . Detached retina, left 06/30/2017    Priority: High  . AAA (abdominal aortic aneurysm) ([place]) 05/12/2017    Priority: High  . CAD (coronary artery disease) of artery bypass graft 05/19/2014    Priority: High  . BPPV (benign paroxysmal positional vertigo) 09/01/2016    Priority: Medium  . Insomnia 11/22/2014    Priority: Medium  . Anemia 07/20/2014    Priority: Medium  . BPH (benign prostatic hyperplasia) 12/05/2013    Priority: Medium  . Hyperlipidemia 03/13/2008    Priority: Medium  . B12 deficiency 06/07/2007    Priority: Medium  . Essential hypertension 04/09/2007    Priority: Medium  . Left sided abdominal pain 04/15/2017    Priority: Low  . Low back pain 02/06/2017    Priority: Low  . Internal and external bleeding hemorrhoids 01/22/2015    Priority: Low  . Palpitations 02/11/2013    Priority: Low  . OSTEOARTHRITIS, WRIST, RIGHT 08/05/2010    Priority: Low  . ACNE ROSACEA 06/27/2009    Priority: Low  . ESOPHAGEAL STRICTURE 04/27/2009    Priority: Low  . ACTINIC KERATOSIS, HEAD 04/18/2009    Priority: Low  . NECK PAIN 09/14/2008    Priority: Low  . NEUROPATHY, IDIOPATHIC PERIPHERAL NEC 08/11/2007    Priority: Low  . Irritable bowel syndrome 08/11/2007    Priority: Low  . ALLERGIC RHINITIS 04/09/2007    Priority: Low  . Thrombocytopenia ([place]) 07/31/2017  . Anal fissure 04/07/2016    Medications- reviewed and updated Current Outpatient Prescriptions  Medication Sig Dispense Refill  . acetaminophen (TYLENOL) 500 MG tablet Take 500 mg by mouth daily as needed (for pain). Reported on 04/11/2016    . aspirin 81 MG EC tablet Take 1 tablet (81 mg total) by mouth daily.    . cetirizine (ZYRTEC) 10 MG tablet  Take 10 mg by mouth daily as needed for allergies.     . cyanocobalamin (,VITAMIN B-12,) 1000 MCG/ML injection Inject 1,000 mcg into the muscle every 30 (thirty) days.    . fluticasone (FLONASE) 50 MCG/ACT nasal spray USE 1 TO 2 SPRAYS IN EACH NOSTRIL DAILY AS NEEDED FOR ALLERGIES 16 g 3  . metoprolol succinate (TOPROL-XL) 25 MG 24 hr tablet Take 0.5 tablets (12.5 mg total) by mouth daily. 46 tablet 3  . Multiple Vitamins-Minerals (CENTRUM SILVER PO) Take 1 tablet by mouth daily.    [name] Kitchen zolpidem (AMBIEN) 5 MG tablet TAKE 1 TABLET BY MOUTH AT BEDTIME AS NEEDED FOR SLEEP 30 tablet 5  . atorvastatin (LIPITOR) 20 MG tablet Take 1 tablet (20 mg total) by mouth daily. 90 tablet 3   Objective: BP 118/88 (BP Location: Right Arm, Patient Position: Sitting, Cuff Size: Normal)   Pulse 67   Temp 98 F (36.7 C) (Oral)   Ht 6' (1.829 m)   Wt 189 lb 12.8 oz (86.1 kg)   SpO2 97%   BMI 25.74 kg/m  Gen: NAD, resting comfortably CV: RRR no murmurs rubs or gallops Lungs: CTAB no crackles, wheeze, rhonchi Abdomen: no suprapubic or cva tenderness Ext: no edema Skin: warm, dry  Assessment/Plan:  Nocturia - Plan: Urine Culture, Urinalysis, Urinalysis  Benign prostatic hyperplasia with nocturia S: peeing every 30-40 minutes all night long. When  he gets up in the morning - urination issues stop- back to normal. Sweaty at night as well. No fevers.   When he takes flomax has less issues. He has taken only intermittently.   Of very important note- patient hospitalized last month with pyelonephritis. He has not been febrile or had rigors and symptoms not worsening- in fact considered cancelling visit A/P: Nocturia suspect due to BPH- encouraged more regular flomax given he has tolerated it without ortho stasis lately. Will get UA and culture due to recent pyelonephritis but no clear systemic symptoms at present- gave strict return precautions though.   Will follow up closely as well given prior orthostatic  symptoms- strongly considering finasteride as well  Future Appointments Date Time Provider [place]  09/15/2017 1:15 PM [name], MD [facility] None   Return in about 4 weeks (around 09/14/2017).  Orders Placed This Encounter  Procedures  . Urine Culture    [facility]  . Urinalysis    Standing Status:   Future    Number of Occurrences:   1    Standing Expiration Date:   08/17/2018   Return precautions advised.  [name], MD

## 2017-08-18 ENCOUNTER — Telehealth: Payer: Self-pay | Admitting: Family Medicine

## 2017-08-18 LAB — URINALYSIS, ROUTINE W REFLEX MICROSCOPIC
Bilirubin Urine: NEGATIVE
Ketones, ur: NEGATIVE
Nitrite: POSITIVE — AB
RBC / HPF: NONE SEEN (ref 0–?)
Specific Gravity, Urine: >=1.03 — AB (ref 1.000–1.030)
Total Protein, Urine: 30 — AB
Urine Glucose: NEGATIVE
Urobilinogen, UA: 0.2 (ref 0.0–1.0)
pH: 5.5 (ref 5.0–8.0)

## 2017-08-18 NOTE — Telephone Encounter (Signed)
Butch Penny reviewed over lab results with patient earlier

## 2017-08-18 NOTE — Telephone Encounter (Signed)
Patient returning phone call about lab results. Okay to leave a detailed message on phone.

## 2017-08-19 ENCOUNTER — Other Ambulatory Visit: Payer: Self-pay | Admitting: Family Medicine

## 2017-08-19 MED ORDER — SULFAMETHOXAZOLE-TRIMETHOPRIM 800-160 MG PO TABS: 1.0000 | ORAL_TABLET | Freq: Two times a day (BID) | ORAL | 0 refills | Status: DC

## 2017-08-20 ENCOUNTER — Telehealth: Payer: Self-pay | Admitting: Family Medicine

## 2017-08-20 LAB — URINE CULTURE
MICRO NUMBER:: 81085699
SPECIMEN QUALITY:: ADEQUATE

## 2017-08-20 NOTE — Telephone Encounter (Signed)
Called and spoke with patient. Reviewed over lab results

## 2017-08-20 NOTE — Telephone Encounter (Signed)
Patient returning phone call. I did not see a note in epic to direct the call. Call patient to advise, okay to leave a detailed message.

## 2017-08-31 ENCOUNTER — Ambulatory Visit (INDEPENDENT_AMBULATORY_CARE_PROVIDER_SITE_OTHER): Payer: Medicare Other | Admitting: Nurse Practitioner

## 2017-08-31 ENCOUNTER — Encounter: Payer: Self-pay | Admitting: Nurse Practitioner

## 2017-08-31 VITALS — BP 130/80 | HR 72 | Ht 72.0 in | Wt 189.0 lb

## 2017-08-31 DIAGNOSIS — I25810 Atherosclerosis of coronary artery bypass graft(s) without angina pectoris: Secondary | ICD-10-CM | POA: Diagnosis not present

## 2017-08-31 DIAGNOSIS — I10 Essential (primary) hypertension: Secondary | ICD-10-CM | POA: Diagnosis not present

## 2017-08-31 NOTE — Progress Notes (Signed)
CARDIOLOGY OFFICE NOTE  Date:  08/31/2017     Timothy Gallagher Date of Birth: 1936-03-23 Medical Record #[id number]  PCP:  [name], MD  Cardiologist:  [name]    Chief Complaint  Patient presents with  . Coronary Artery Disease    14 month check - seen for Dr. [name]    History of Present Illness: Timothy Gallagher is a 81 y.o. male who presents today for a 14 month check. Seen for Dr. [name].   He has a known history of coronary artery disease, history of CABG (LIMA-LAD, SVG-PDA, SVG-OM, SVG-diagonal) by Dr. [name] 2015, hyperlipidemia, hypertension and history of chronic kidney disease.  Last seen in August of 2017. Was doing well.   Admitted back in September with UTI/pyelonephritis.   Comes in today. Here with his wife today. Remains on antibiotics for his UTI - has about 4 more days. Sounds like he was pretty sick from this. Was having what he describes as exertional fatigue prior to getting sick last month - trouble mowing the grass- this was the symptom that led to his CABG as well. No actual chest pain. Not short of breath but he was last month while sick with the sepsis/UTI. Noted RBBB with LAFB on EKG from September. He has not really resumed his activities since last month - has not mowed the grass.  Not dizzy or lightheaded. No syncope. He feels like his energy is improving. Had a detached retina at the first of August. Toprol has been cut back in the past due to low BP. BP ok at home for the most part. He feels like he is ok for the most part.   Past Medical History:  Diagnosis Date  . ACNE ROSACEA 06/27/2009  . ACTINIC KERATOSIS, HEAD 04/18/2009  . Acute maxillary sinusitis 05/14/2010  . ALLERGIC RHINITIS 04/09/2007  . Anal fissure 04/07/2016  . B12 DEFICIENCY 06/07/2007  . BACK PAIN WITH RADICULOPATHY 04/24/2008  . Cancer ([place])    skin  . CHEST WALL PAIN, ACUTE 06/15/2009  . Chronic maxillary sinusitis 05/29/2008  . COLITIS 04/27/2009  . COLONIC  POLYPS, HX OF 04/27/2009   tubular adenomas  . Coronary artery disease 05/12/2014   Cath 05/12/2014 w/ severe 3-vessel CAD and preserved LV function, EF 55%  . DERMATITIS, ATOPIC 10/12/2007  . DIVERTICULOSIS, COLON 04/27/2009  . ECCHYMOSES, SPONTANEOUS 06/27/2008  . Elevated sedimentation rate 05/02/2009  . ESOPHAGEAL STRICTURE 04/27/2009  . GASTRITIS, CHRONIC 04/27/2009  . HYPERLIPIDEMIA 03/13/2008  . HYPERTENSION 04/09/2007  . Internal bleeding hemorrhoids 01/22/2015   01/22/2015 Seen at anoscopy, grade 1 all 3 positions   . Irritable bowel syndrome 08/11/2007  . KIDNEY DISEASE 04/09/2007  . NECK PAIN 09/14/2008  . NEUROPATHY, IDIOPATHIC PERIPHERAL NEC 08/11/2007  . OSTEOARTHRITIS, WRIST, RIGHT 08/05/2010  . Postoperative delirium 05/20/2014  . S/P CABG x 4 05/19/2014   LIMA to LAD, SVG to diag, SVG to OM, SVG to PDA, EVH via right thigh and leg    Past Surgical History:  Procedure Laterality Date  . CARDIAC CATHETERIZATION    . COLONOSCOPY    . CORONARY ARTERY BYPASS GRAFT N/A 05/19/2014   Procedure: CORONARY ARTERY BYPASS GRAFTING (CABG);  Surgeon: Rexene Alberts, MD;  Location: [place] [facility];  Service: Open Heart Surgery;  Laterality: N/A;  Times 4 using left internal mammary artery and endoscopically harvested right saphenous vein  . ESOPHAGOGASTRODUODENOSCOPY    . FINGER SURGERY     cut off end of  finger  . FLEXIBLE SIGMOIDOSCOPY    . HEMORRHOID BANDING    . HERNIA REPAIR    . INCISION AND DRAINAGE WOUND WITH FOREIGN BODY REMOVAL Left 12/20/2013   Procedure: INCISION AND DRAINAGE LEFT INDEX FINGER;  Surgeon: Tennis Must, MD;  Location: [facility];  Service: Orthopedics;  Laterality: Left;  . INTRAOPERATIVE TRANSESOPHAGEAL ECHOCARDIOGRAM N/A 05/19/2014   Procedure: INTRAOPERATIVE TRANSESOPHAGEAL ECHOCARDIOGRAM;  Surgeon: Rexene Alberts, MD;  Location: [place];  Service: Open Heart Surgery;  Laterality: N/A;  . lamenectomy    . LEFT HEART CATHETERIZATION WITH CORONARY ANGIOGRAM N/A 05/12/2014   Procedure:  LEFT HEART CATHETERIZATION WITH CORONARY ANGIOGRAM;  Surgeon: Sinclair Grooms, MD;  Location: [facility] C[facility];  Service: Cardiovascular;  Laterality: N/A;  . LUMBAR FUSION    . TONSILLECTOMY    . VARICOSE VEIN SURGERY Left      Medications: Current Meds  Medication Sig  . acetaminophen (TYLENOL) 500 MG tablet Take 500 mg by mouth daily as needed (for pain). Reported on 04/11/2016  . aspirin 81 MG EC tablet Take 1 tablet (81 mg total) by mouth daily.  [name] Kitchen atorvastatin (LIPITOR) 20 MG tablet Take 1 tablet (20 mg total) by mouth daily.  . cetirizine (ZYRTEC) 10 MG tablet Take 10 mg by mouth daily as needed for allergies.   . cyanocobalamin (,VITAMIN B-12,) 1000 MCG/ML injection Inject 1,000 mcg into the muscle every 30 (thirty) days.  . fluticasone (FLONASE) 50 MCG/ACT nasal spray USE 1 TO 2 SPRAYS IN EACH NOSTRIL DAILY AS NEEDED FOR ALLERGIES  . metoprolol succinate (TOPROL-XL) 25 MG 24 hr tablet Take 0.5 tablets (12.5 mg total) by mouth daily.  . Multiple Vitamins-Minerals (CENTRUM SILVER PO) Take 1 tablet by mouth daily.  [name] Kitchen sulfamethoxazole-trimethoprim (BACTRIM DS,SEPTRA DS) 800-160 MG tablet Take 1 tablet by mouth 2 (two) times daily.  [name] Kitchen zolpidem (AMBIEN) 5 MG tablet TAKE 1 TABLET BY MOUTH AT BEDTIME AS NEEDED FOR SLEEP     Allergies: Allergies  Allergen Reactions  . Lipitor [Atorvastatin] Other (See Comments)    REACTION: nausea and blurred vision  . Trazodone And Nefazodone Other (See Comments)    dizzy  . Ciprofloxacin Swelling  . Mycophenolate Mofetil Other (See Comments)    REACTION: unspecified  . Amoxicillin Rash    Has patient had a PCN reaction causing immediate rash, facial/tongue/throat swelling, SOB or lightheadedness with hypotension: Unknown Has patient had a PCN reaction causing severe rash involving mucus membranes or skin necrosis: Unknown Has patient had a PCN reaction that required hospitalization: Unknown Has patient had a PCN reaction occurring within the last  10 years: Unknown If all of the above answers are "NO", then may proceed with Cephalosporin use.   [name] Kitchen Penicillins Rash    Has patient had a PCN reaction causing immediate rash, facial/tongue/throat swelling, SOB or lightheadedness with hypotension: Unknown Has patient had a PCN reaction causing severe rash involving mucus membranes or skin necrosis: Unknown Has patient had a PCN reaction that required hospitalization: Unknown Has patient had a PCN reaction occurring within the last 10 years: Unknown If all of the above answers are "NO", then may proceed with Cephalosporin use.   . Rosuvastatin Other (See Comments)    Unknown     Social History: The patient  reports that he quit smoking about 18 years ago. His smoking use included Cigarettes. He has a 21.00 pack-year smoking history. He has never used smokeless tobacco. He reports that he does not drink alcohol or use  drugs.   Family History: The patient's family history includes COPD in his father; Heart disease in his father; Hernia in his mother.   Review of Systems: Please see the history of present illness.   Otherwise, the review of systems is positive for none.   All other systems are reviewed and negative.   Physical Exam: VS:  BP 130/80 (BP Location: Left Arm, Patient Position: Sitting, Cuff Size: Normal)   Pulse 72   Ht 6' (1.829 m)   Wt 189 lb (85.7 kg)   BMI 25.63 kg/m  .  BMI Body mass index is 25.63 kg/m.  Wt Readings from Last 3 Encounters:  08/31/17 189 lb (85.7 kg)  08/17/17 189 lb 12.8 oz (86.1 kg)  08/06/17 194 lb 3.2 oz (88.1 kg)    General: Pleasant. Well developed, well nourished and in no acute distress.   HEENT: Normal.  Neck: Supple, no JVD, carotid bruits, or masses noted.  Cardiac: Regular rate and rhythm. No murmurs, rubs, or gallops. No edema.  Respiratory:  Lungs are clear to auscultation bilaterally with normal work of breathing.  GI: Soft and nontender.  MS: No deformity or atrophy. Gait and  ROM intact.  Skin: Warm and dry. Color is normal.  Neuro:  Strength and sensation are intact and no gross focal deficits noted.  Psych: Alert, appropriate and with normal affect.   LABORATORY DATA:  EKG:  EKG is ordered today. This shows NSR with RBBB and left axis. EKG from 07/2017 noted bifascicular block. Reviewed with Dr. [name].   Lab Results  Component Value Date   WBC 6.0 08/02/2017   HGB 11.2 (L) 08/02/2017   HCT 33.9 (L) 08/02/2017   PLT 119 (L) 08/02/2017   GLUCOSE 111 (H) 08/02/2017   CHOL 125 02/04/2017   TRIG 79 02/04/2017   HDL 44 02/04/2017   LDLDIRECT 91.0 05/23/2016   LDLCALC 65 02/04/2017   ALT 27 08/01/2017   AST 47 (H) 08/01/2017   NA 140 08/02/2017   K 3.7 08/02/2017   CL 110 08/02/2017   CREATININE 0.82 08/02/2017   BUN 14 08/02/2017   CO2 21 (L) 08/02/2017   TSH 1.64 05/23/2016   PSA 3.52 08/29/2013   INR 1.46 05/19/2014   HGBA1C 6.3 (H) 05/17/2014     BNP (last 3 results) No results for input(s): BNP in the last 8760 hours.  ProBNP (last 3 results) No results for input(s): PROBNP in the last 8760 hours.   Other Studies Reviewed Today:  Echocardiogram 05/19/2014: Transesophageal Study Conclusions  - Left ventricle: Systolic function was normal. The estimated ejection fraction was in the range of 55% to 60%. Wall motion was normal; there were no regional wall motion abnormalities. - Aortic valve: No evidence of vegetation. There was trivial regurgitation. - Mitral valve: No evidence of vegetation. There was mild regurgitation. - Left atrium: No evidence of thrombus in the appendage. - Atrial septum: No defect or patent foramen ovale was identified. - Tricuspid valve: No evidence of vegetation. - Pulmonic valve: No evidence of vegetation.  Assessment/Plan: 1. CAD - prior CABG back in 2015 - exertional fatigue is his reported symptom - has had some recurrence - just prior to being admitted with urosepsis - has not resumed regular  activities - would like for him to try and get back to his usual routine of activities. See back in about 2 to 3 months to recheck. He may need functional testing if he is not able to progress.   2.  HTN - BP ok on current regimen  3. HLD - on statin - needs lab - not fasting today - will check on return.   4. Fatigue - ?multifactorial but also need to be concerned for possible recurrent angina - see above.   5. Bifascicular block - just RBBB today noted on EKG - on low dose Toprol - no syncope noted - will continue for now - discussed with Dr. [name].   Current medicines are reviewed with the patient today.  The patient does not have concerns regarding medicines other than what has been noted above.  The following changes have been made:  See above.  Labs/ tests ordered today include:   No orders of the defined types were placed in this encounter.    Disposition:   FU with Dr. [name] in about 2 to 3 months to recheck.    Patient is agreeable to this plan and will call if any problems develop in the interim.   SignedTruitt Merle, NP  08/31/2017 11:00 AM  [facility] [address] [place], [place]  [address] Phone: [phone] Fax: [phone]

## 2017-08-31 NOTE — Patient Instructions (Addendum)
We will be checking the following labs today - NONE   Medication Instructions:    Continue with your current medicines.     Testing/Procedures To Be Arranged:  N/A  Follow-Up:   See Dr. [name] in about 2 to 3 months    Other Special Instructions:   Gradually resume your activities. If the fatigue remains persistent and you can't not resume your activities - let us know - we may need to do other cardiac testing.     If you need a refill on your cardiac medications before your next appointment, please call your pharmacy.   Call the [place] office at [phone] if you have any questions, problems or concerns.

## 2017-09-10 ENCOUNTER — Encounter: Payer: Self-pay | Admitting: Family Medicine

## 2017-09-10 ENCOUNTER — Ambulatory Visit (INDEPENDENT_AMBULATORY_CARE_PROVIDER_SITE_OTHER): Payer: Medicare Other | Admitting: Family Medicine

## 2017-09-10 VITALS — BP 128/72 | HR 68 | Temp 97.8°F | Ht 72.0 in | Wt 193.8 lb

## 2017-09-10 DIAGNOSIS — Z23 Encounter for immunization: Secondary | ICD-10-CM

## 2017-09-10 DIAGNOSIS — I10 Essential (primary) hypertension: Secondary | ICD-10-CM

## 2017-09-10 DIAGNOSIS — N401 Enlarged prostate with lower urinary tract symptoms: Secondary | ICD-10-CM | POA: Diagnosis not present

## 2017-09-10 DIAGNOSIS — R351 Nocturia: Secondary | ICD-10-CM

## 2017-09-10 DIAGNOSIS — N3000 Acute cystitis without hematuria: Secondary | ICD-10-CM

## 2017-09-10 NOTE — Assessment & Plan Note (Addendum)
S: BPH with nocturia. Last visit was peeing every 30-40 minutes all night long and feeling sweaty at night. Flomax helped slightly. He had been hospitalized for pyelonephritis prior. We opted to get urine culture which showed E. Coli pan sensitive and he was treated with bactrim for 2 weeks after he had prior been treated with third generation cephalosporin for 2 weeks.   He states this is the best he has felt in 6 months- started within a few days of bactrim. Compliant with flomax. Denies orthostasis A/P: I suspect BPH has contributed to UTIs. Continue flomax- lucky has not been orthostatic. Given severity of 1st of recent infections then recurrence- will check urine culture

## 2017-09-10 NOTE — Assessment & Plan Note (Signed)
S: controlled on repeat on metoprolol 12.5mg  XL. initital 150/86 BP Readings from Last 3 Encounters:  09/10/17 128/72  08/31/17 130/80  08/17/17 118/88  A/P: We discussed blood pressure goal of <140/90. Continue current meds:  Doing better on repeat.

## 2017-09-10 NOTE — Addendum Note (Signed)
Addended by: Mariam Dollar, Roselyn Reef M on: 09/10/2017 02:06 PM   Modules accepted: Orders

## 2017-09-10 NOTE — Patient Instructions (Signed)
Drop off urine before you go  Thanks for getting your flu shot

## 2017-09-10 NOTE — Progress Notes (Addendum)
Subjective:  Timothy Gallagher is a 81 y.o. year old very pleasant male patient who presents for/with See problem oriented charting ROS- no dysuria or polyuria. No nocturia. No fever or night sweats. No chest pain. No orthostasis   Past Medical History-  Patient Active Problem List   Diagnosis Date Noted  . Detached retina, left 06/30/2017    Priority: High  . AAA (abdominal aortic aneurysm) ([place]) 05/12/2017    Priority: High  . CAD (coronary artery disease) of artery bypass graft 05/19/2014    Priority: High  . BPPV (benign paroxysmal positional vertigo) 09/01/2016    Priority: Medium  . Insomnia 11/22/2014    Priority: Medium  . Anemia 07/20/2014    Priority: Medium  . BPH (benign prostatic hyperplasia) 12/05/2013    Priority: Medium  . Hyperlipidemia 03/13/2008    Priority: Medium  . B12 deficiency 06/07/2007    Priority: Medium  . Essential hypertension 04/09/2007    Priority: Medium  . Left sided abdominal pain 04/15/2017    Priority: Low  . Low back pain 02/06/2017    Priority: Low  . Internal and external bleeding hemorrhoids 01/22/2015    Priority: Low  . Palpitations 02/11/2013    Priority: Low  . OSTEOARTHRITIS, WRIST, RIGHT 08/05/2010    Priority: Low  . ACNE ROSACEA 06/27/2009    Priority: Low  . ESOPHAGEAL STRICTURE 04/27/2009    Priority: Low  . ACTINIC KERATOSIS, HEAD 04/18/2009    Priority: Low  . NECK PAIN 09/14/2008    Priority: Low  . NEUROPATHY, IDIOPATHIC PERIPHERAL NEC 08/11/2007    Priority: Low  . Irritable bowel syndrome 08/11/2007    Priority: Low  . ALLERGIC RHINITIS 04/09/2007    Priority: Low  . Thrombocytopenia ([place]) 07/31/2017  . Anal fissure 04/07/2016    Medications- reviewed and updated Current Outpatient Prescriptions  Medication Sig Dispense Refill  . acetaminophen (TYLENOL) 500 MG tablet Take 500 mg by mouth daily as needed (for pain). Reported on 04/11/2016    . aspirin 81 MG EC tablet Take 1 tablet (81 mg total) by mouth  daily.    . cetirizine (ZYRTEC) 10 MG tablet Take 10 mg by mouth daily as needed for allergies.     . cyanocobalamin (,VITAMIN B-12,) 1000 MCG/ML injection Inject 1,000 mcg into the muscle every 30 (thirty) days.    . fluticasone (FLONASE) 50 MCG/ACT nasal spray USE 1 TO 2 SPRAYS IN EACH NOSTRIL DAILY AS NEEDED FOR ALLERGIES 16 g 3  . metoprolol succinate (TOPROL-XL) 25 MG 24 hr tablet Take 0.5 tablets (12.5 mg total) by mouth daily. 46 tablet 3  . Multiple Vitamins-Minerals (CENTRUM SILVER PO) Take 1 tablet by mouth daily.    [name] Kitchen sulfamethoxazole-trimethoprim (BACTRIM DS,SEPTRA DS) 800-160 MG tablet Take 1 tablet by mouth 2 (two) times daily. 28 tablet 0  . zolpidem (AMBIEN) 5 MG tablet TAKE 1 TABLET BY MOUTH AT BEDTIME AS NEEDED FOR SLEEP 30 tablet 5  . atorvastatin (LIPITOR) 20 MG tablet Take 1 tablet (20 mg total) by mouth daily. 90 tablet 3   No current facility-administered medications for this visit.     Objective: BP 128/72   Pulse 68   Temp 97.8 F (36.6 C) (Oral)   Ht 6' (1.829 m)   Wt 193 lb 12.8 oz (87.9 kg)   SpO2 97%   BMI 26.28 kg/m  Gen: NAD, resting comfortably CV: RRR no murmurs rubs or gallops Lungs: CTAB no crackles, wheeze, rhonchi No suprapubic abdominal pain Ext: no  edema Skin: warm, dry  Assessment/Plan:  BPH (benign prostatic hyperplasia) S: BPH with nocturia. Last visit was peeing every 30-40 minutes all night long and feeling sweaty at night. Flomax helped slightly. He had been hospitalized for pyelonephritis prior. We opted to get urine culture which showed E. Coli pan sensitive and he was treated with bactrim for 2 weeks after he had prior been treated with third generation cephalosporin for 2 weeks.   He states this is the best he has felt in 6 months- started within a few days of bactrim. Compliant with flomax. Denies orthostasis A/P: I suspect BPH has contributed to UTIs. Continue flomax- lucky has not been orthostatic. Given severity of 1st of recent  infections then recurrence- will check urine culture  Essential hypertension S: controlled on repeat on metoprolol 12.5mg  XL. initital 150/86 BP Readings from Last 3 Encounters:  09/10/17 128/72  08/31/17 130/80  08/17/17 118/88  A/P: We discussed blood pressure goal of <140/90. Continue current meds:  Doing better on repeat.   Future Appointments Date Time Provider [place]  10/30/2017 10:00 AM Belva Crome, MD [facility]   Flu shot given Orders Placed This Encounter  Procedures  . Urine Culture    [facility]   Return precautions advised.  [name], MD

## 2017-09-11 LAB — URINE CULTURE
MICRO NUMBER:: 81196926
Result:: NO GROWTH
SPECIMEN QUALITY:: ADEQUATE

## 2017-09-14 ENCOUNTER — Ambulatory Visit: Payer: Medicare Other | Admitting: Family Medicine

## 2017-09-14 DIAGNOSIS — H33022 Retinal detachment with multiple breaks, left eye: Secondary | ICD-10-CM | POA: Diagnosis not present

## 2017-09-14 DIAGNOSIS — H35352 Cystoid macular degeneration, left eye: Secondary | ICD-10-CM | POA: Diagnosis not present

## 2017-09-14 DIAGNOSIS — H3342 Traction detachment of retina, left eye: Secondary | ICD-10-CM | POA: Diagnosis not present

## 2017-09-14 DIAGNOSIS — Z961 Presence of intraocular lens: Secondary | ICD-10-CM | POA: Diagnosis not present

## 2017-09-15 ENCOUNTER — Ambulatory Visit: Payer: Medicare Other | Admitting: Family Medicine

## 2017-09-16 DIAGNOSIS — H33052 Total retinal detachment, left eye: Secondary | ICD-10-CM | POA: Diagnosis not present

## 2017-09-16 DIAGNOSIS — Z961 Presence of intraocular lens: Secondary | ICD-10-CM | POA: Diagnosis not present

## 2017-09-16 DIAGNOSIS — H3522 Other non-diabetic proliferative retinopathy, left eye: Secondary | ICD-10-CM | POA: Diagnosis not present

## 2017-09-18 DIAGNOSIS — Z961 Presence of intraocular lens: Secondary | ICD-10-CM | POA: Diagnosis not present

## 2017-09-18 DIAGNOSIS — Z951 Presence of aortocoronary bypass graft: Secondary | ICD-10-CM | POA: Diagnosis not present

## 2017-09-18 DIAGNOSIS — Z87891 Personal history of nicotine dependence: Secondary | ICD-10-CM | POA: Diagnosis not present

## 2017-09-18 DIAGNOSIS — H3522 Other non-diabetic proliferative retinopathy, left eye: Secondary | ICD-10-CM | POA: Diagnosis not present

## 2017-09-18 DIAGNOSIS — N400 Enlarged prostate without lower urinary tract symptoms: Secondary | ICD-10-CM | POA: Diagnosis not present

## 2017-09-18 DIAGNOSIS — Z880 Allergy status to penicillin: Secondary | ICD-10-CM | POA: Diagnosis not present

## 2017-09-18 DIAGNOSIS — H33052 Total retinal detachment, left eye: Secondary | ICD-10-CM | POA: Diagnosis not present

## 2017-09-18 DIAGNOSIS — H3342 Traction detachment of retina, left eye: Secondary | ICD-10-CM | POA: Diagnosis not present

## 2017-09-18 DIAGNOSIS — Z9889 Other specified postprocedural states: Secondary | ICD-10-CM | POA: Diagnosis not present

## 2017-10-05 ENCOUNTER — Telehealth: Payer: Self-pay | Admitting: *Deleted

## 2017-10-05 NOTE — Telephone Encounter (Signed)
Yes thanks, may fill 

## 2017-10-05 NOTE — Telephone Encounter (Signed)
Received refill request from pharmacy for Zolpidem 5 mg one tablet at bedtime as needed for sleep. Please advise if okay to refill?

## 2017-10-06 ENCOUNTER — Other Ambulatory Visit: Payer: Self-pay

## 2017-10-06 MED ORDER — ZOLPIDEM TARTRATE 5 MG PO TABS: 5.0000 mg | ORAL_TABLET | Freq: Every evening | ORAL | 5 refills | Status: DC | PRN

## 2017-10-06 NOTE — Telephone Encounter (Signed)
Prescription printed and placed for signature

## 2017-10-19 ENCOUNTER — Ambulatory Visit: Payer: Medicare Other | Admitting: Family Medicine

## 2017-10-21 DIAGNOSIS — H33052 Total retinal detachment, left eye: Secondary | ICD-10-CM | POA: Diagnosis not present

## 2017-10-30 ENCOUNTER — Ambulatory Visit: Payer: Medicare Other | Admitting: Interventional Cardiology

## 2017-10-30 ENCOUNTER — Encounter (INDEPENDENT_AMBULATORY_CARE_PROVIDER_SITE_OTHER): Payer: Self-pay

## 2017-10-30 ENCOUNTER — Encounter: Payer: Self-pay | Admitting: Interventional Cardiology

## 2017-10-30 VITALS — BP 138/70 | HR 71 | Ht 73.0 in | Wt 194.4 lb

## 2017-10-30 DIAGNOSIS — I714 Abdominal aortic aneurysm, without rupture, unspecified: Secondary | ICD-10-CM

## 2017-10-30 DIAGNOSIS — I25810 Atherosclerosis of coronary artery bypass graft(s) without angina pectoris: Secondary | ICD-10-CM

## 2017-10-30 DIAGNOSIS — E785 Hyperlipidemia, unspecified: Secondary | ICD-10-CM | POA: Diagnosis not present

## 2017-10-30 DIAGNOSIS — I10 Essential (primary) hypertension: Secondary | ICD-10-CM

## 2017-10-30 NOTE — Progress Notes (Signed)
Cardiology Office Note    Date:  10/30/2017   ID:  Timothy Gallagher, DOB [birth year], MRN [id number]  PCP:  [name], MD  Cardiologist: Sinclair Grooms, MD   Chief Complaint  Patient presents with  . Coronary Artery Disease    History of Present Illness:  Timothy Gallagher is a 81 y.o. male who is seen for follow-up of coronary artery disease, history ofCABG (LIMA-LAD, SVG-PDA, SVG-OM, SVG-diagonal) by Dr. [name] 2015, hyperlipidemia, hypertension and history of kidney disease.   He is doing well from cardiac standpoint.  He denies angina, dyspnea, orthopnea, PND, leg swelling, syncope.  He has not needed nitroglycerin.  He has no limitations.    Past Medical History:  Diagnosis Date  . ACNE ROSACEA 06/27/2009  . ACTINIC KERATOSIS, HEAD 04/18/2009  . Acute maxillary sinusitis 05/14/2010  . ALLERGIC RHINITIS 04/09/2007  . Anal fissure 04/07/2016  . B12 DEFICIENCY 06/07/2007  . BACK PAIN WITH RADICULOPATHY 04/24/2008  . Cancer ([place])    skin  . CHEST WALL PAIN, ACUTE 06/15/2009  . Chronic maxillary sinusitis 05/29/2008  . COLITIS 04/27/2009  . COLONIC POLYPS, HX OF 04/27/2009   tubular adenomas  . Coronary artery disease 05/12/2014   Cath 05/12/2014 w/ severe 3-vessel CAD and preserved LV function, EF 55%  . DERMATITIS, ATOPIC 10/12/2007  . DIVERTICULOSIS, COLON 04/27/2009  . ECCHYMOSES, SPONTANEOUS 06/27/2008  . Elevated sedimentation rate 05/02/2009  . ESOPHAGEAL STRICTURE 04/27/2009  . GASTRITIS, CHRONIC 04/27/2009  . HYPERLIPIDEMIA 03/13/2008  . HYPERTENSION 04/09/2007  . Internal bleeding hemorrhoids 01/22/2015   01/22/2015 Seen at anoscopy, grade 1 all 3 positions   . Irritable bowel syndrome 08/11/2007  . KIDNEY DISEASE 04/09/2007  . NECK PAIN 09/14/2008  . NEUROPATHY, IDIOPATHIC PERIPHERAL NEC 08/11/2007  . OSTEOARTHRITIS, WRIST, RIGHT 08/05/2010  . Postoperative delirium 05/20/2014  . S/P CABG x 4 05/19/2014   LIMA to LAD, SVG to diag, SVG to OM, SVG to PDA, EVH via right thigh  and leg    Past Surgical History:  Procedure Laterality Date  . CARDIAC CATHETERIZATION    . COLONOSCOPY    . CORONARY ARTERY BYPASS GRAFT N/A 05/19/2014   Procedure: CORONARY ARTERY BYPASS GRAFTING (CABG);  Surgeon: Rexene Alberts, MD;  Location: [facility];  Service: Open Heart Surgery;  Laterality: N/A;  Times 4 using left internal mammary artery and endoscopically harvested right saphenous vein  . ESOPHAGOGASTRODUODENOSCOPY    . FINGER SURGERY     cut off end of finger  . FLEXIBLE SIGMOIDOSCOPY    . HEMORRHOID BANDING    . HERNIA REPAIR    . INCISION AND DRAINAGE WOUND WITH FOREIGN BODY REMOVAL Left 12/20/2013   Procedure: INCISION AND DRAINAGE LEFT INDEX FINGER;  Surgeon: Tennis Must, MD;  Location: [facility];  Service: Orthopedics;  Laterality: Left;  . INTRAOPERATIVE TRANSESOPHAGEAL ECHOCARDIOGRAM N/A 05/19/2014   Procedure: INTRAOPERATIVE TRANSESOPHAGEAL ECHOCARDIOGRAM;  Surgeon: Rexene Alberts, MD;  Location: [facility];  Service: Open Heart Surgery;  Laterality: N/A;  . lamenectomy    . LEFT HEART CATHETERIZATION WITH CORONARY ANGIOGRAM N/A 05/12/2014   Procedure: LEFT HEART CATHETERIZATION WITH CORONARY ANGIOGRAM;  Surgeon: Sinclair Grooms, MD;  Location: [facility];  Service: Cardiovascular;  Laterality: N/A;  . LUMBAR FUSION    . TONSILLECTOMY    . VARICOSE VEIN SURGERY Left     Current Medications: Outpatient Medications Prior to Visit  Medication Sig Dispense Refill  . acetaminophen (TYLENOL) 500 MG tablet Take 500  mg by mouth daily as needed (for pain). Reported on 04/11/2016    . aspirin 81 MG EC tablet Take 1 tablet (81 mg total) by mouth daily.    . cyanocobalamin (,VITAMIN B-12,) 1000 MCG/ML injection Inject 1,000 mcg into the muscle every 30 (thirty) days.    . fluticasone (FLONASE) 50 MCG/ACT nasal spray USE 1 TO 2 SPRAYS IN EACH NOSTRIL DAILY AS NEEDED FOR ALLERGIES 16 g 3  . metoprolol succinate (TOPROL-XL) 25 MG 24 hr tablet Take 0.5 tablets (12.5 mg total) by mouth  daily. 46 tablet 3  . Multiple Vitamins-Minerals (CENTRUM SILVER PO) Take 1 tablet by mouth daily.    [name] Kitchen zolpidem (AMBIEN) 5 MG tablet Take 1 tablet (5 mg total) at bedtime as needed by mouth. for sleep 30 tablet 5  . atorvastatin (LIPITOR) 20 MG tablet Take 1 tablet (20 mg total) by mouth daily. 90 tablet 3  . cetirizine (ZYRTEC) 10 MG tablet Take 10 mg by mouth daily as needed for allergies.     [name] Kitchen sulfamethoxazole-trimethoprim (BACTRIM DS,SEPTRA DS) 800-160 MG tablet Take 1 tablet by mouth 2 (two) times daily. (Patient not taking: Reported on 10/30/2017) 28 tablet 0   No facility-administered medications prior to visit.      Allergies:   Lipitor [atorvastatin]; Trazodone and nefazodone; Ciprofloxacin; Mycophenolate mofetil; Amoxicillin; Penicillins; and Rosuvastatin   Social History   Socioeconomic History  . Marital status: Married    Spouse name: None  . Number of children: 4  . Years of education: None  . Highest education level: None  Social Needs  . Financial resource strain: None  . Food insecurity - worry: None  . Food insecurity - inability: None  . Transportation needs - medical: None  . Transportation needs - non-medical: None  Occupational History  . Occupation: retired    Fish farm manager: RETIRED    Comment: from [employer]  . Smoking status: Former Smoker    Packs/day: 0.50    Years: 42.00    Pack years: 21.00    Types: Cigarettes    Last attempt to quit: 11/17/1998    Years since quitting: 18.9  . Smokeless tobacco: Never Used  Substance and Sexual Activity  . Alcohol use: No  . Drug use: No  . Sexual activity: Yes  Other Topics Concern  . None  Social History Narrative   Married 35 years (2nd marriage). 2 kids from previous marriage. 2 stepchildren. 8 grandkids.       Retired from [employer]: walking/exercise, building in shop-furniture (cut finger off in February doing this)   6 grand children - Has bought them all a  new car    Brother died of probably addiction   Sister also died; not sure of cause    He enjoys his life; Likes to play the keyboard; guitar    Had played in a gospel quartet      Family History:  The patient's family history includes COPD in his father; Heart disease in his father; Hernia in his mother.   ROS:   Please see the history of present illness.    Retinal detachment with significant vision loss left eye.  Has occasional headaches related to his eye. All other systems reviewed and are negative.   PHYSICAL EXAM:   VS:  BP 138/70   Pulse 71   Ht 6\' 1"  (1.854 m)   Wt 194 lb 6.4 oz (88.2 kg)   BMI 25.65  kg/m    GEN: Well nourished, well developed, in no acute distress  HEENT: normal  Neck: no JVD, carotid bruits, or masses Cardiac: RRR; no murmurs, rubs, or gallops,no edema  Respiratory:  clear to auscultation bilaterally, normal work of breathing GI: soft, nontender, nondistended, + BS MS: no deformity or atrophy  Skin: warm and dry, no rash Neuro:  Alert and Oriented x 3, Strength and sensation are intact Psych: euthymic mood, full affect  Wt Readings from Last 3 Encounters:  10/30/17 194 lb 6.4 oz (88.2 kg)  09/10/17 193 lb 12.8 oz (87.9 kg)  08/31/17 189 lb (85.7 kg)      Studies/Labs Reviewed:   EKG:  EKG no data today.  Sinus rhythm, left atrial abnormality, right bundle branch block with left axis deviation August 31, 2017.  Since prior tracing in September heart rate is slower.  Recent Labs: 08/01/2017: ALT 27 08/02/2017: BUN 14; Creatinine, Ser 0.82; Hemoglobin 11.2; Platelets 119; Potassium 3.7; Sodium 140   Lipid Panel    Component Value Date/Time   CHOL 125 02/04/2017 0902   TRIG 79 02/04/2017 0902   HDL 44 02/04/2017 0902   CHOLHDL 2.8 02/04/2017 0902   CHOLHDL 3.2 08/11/2016 0850   VLDL 20 08/11/2016 0850   LDLCALC 65 02/04/2017 0902   LDLDIRECT 91.0 05/23/2016 1346    Additional studies/ records that were reviewed today include:  June  2018 abdominal CT scan: IMPRESSION: 1. No acute abnormality. 2. Extensive sigmoid colon diverticulosis. 3. Small, nonobstructing bilateral renal calculi. 4. Small left inguinal hernia containing fat and small umbilical hernia containing fat. 5. Stable 3.3 cm infrarenal abdominal aortic aneurysm. Recommend followup by ultrasound in 3 years. This recommendation follows ACR consensus guidelines: White Paper of the ACR Incidental Findings Committee II on Vascular Findings. J Am Coll Radiol 2013; 10:789-794      ASSESSMENT:    1. Coronary artery disease involving coronary bypass graft of native heart without angina pectoris   2. Essential hypertension   3. Abdominal aortic aneurysm (AAA) without rupture ([place])   4. Hyperlipidemia, unspecified hyperlipidemia type      PLAN:  In order of problems listed above:  1. Stable without angina.  Do not estimated we would do any functional testing until 5 years out from bypass grafting. 2. Adequate control. 3. 3.3 cm infrarenal aortic aneurysm, stable with recommendation to follow-up in 3 years.   Clinical follow-up in 1 year.  No change in therapy.    Medication Adjustments/Labs and Tests Ordered: Current medicines are reviewed at length with the patient today.  Concerns regarding medicines are outlined above.  Medication changes, Labs and Tests ordered today are listed in the Patient Instructions below. Patient Instructions  Medication Instructions:  Your physician recommends that you continue on your current medications as directed. Please refer to the Current Medication list given to you today.  Labwork: Liver and Lipid today  Testing/Procedures: None  Follow-Up: Your physician wants you to follow-up in: 1 year with Dr. [name].  You will receive a reminder letter in the mail two months in advance. If you don't receive a letter, please call our office to schedule the follow-up appointment.   Any Other Special Instructions Will Be  Listed Below (If Applicable).     If you need a refill on your cardiac medications before your next appointment, please call your pharmacy.      Signed, Sinclair Grooms, MD  10/30/2017 11:15 AM    [facility]  [address] [place], [place]  [address] Phone: [phone]; Fax: [phone]

## 2017-10-30 NOTE — Patient Instructions (Signed)
Medication Instructions:  Your physician recommends that you continue on your current medications as directed. Please refer to the Current Medication list given to you today.  Labwork: Liver and Lipid today  Testing/Procedures: None  Follow-Up: Your physician wants you to follow-up in: 1 year with Dr. Smith.  You will receive a reminder letter in the mail two months in advance. If you don't receive a letter, please call our office to schedule the follow-up appointment.   Any Other Special Instructions Will Be Listed Below (If Applicable).     If you need a refill on your cardiac medications before your next appointment, please call your pharmacy.   

## 2017-11-04 ENCOUNTER — Encounter: Payer: Self-pay | Admitting: Family Medicine

## 2017-11-04 ENCOUNTER — Ambulatory Visit: Payer: Medicare Other | Admitting: Family Medicine

## 2017-11-04 VITALS — BP 136/76 | HR 73 | Temp 97.3°F | Ht 73.0 in | Wt 195.2 lb

## 2017-11-04 DIAGNOSIS — E785 Hyperlipidemia, unspecified: Secondary | ICD-10-CM | POA: Diagnosis not present

## 2017-11-04 DIAGNOSIS — K581 Irritable bowel syndrome with constipation: Secondary | ICD-10-CM | POA: Diagnosis not present

## 2017-11-04 MED ORDER — ATORVASTATIN CALCIUM 20 MG PO TABS: 20.0000 mg | ORAL_TABLET | Freq: Every day | ORAL | 3 refills | Status: DC

## 2017-11-04 NOTE — Patient Instructions (Signed)
Can also take colace/docusate 100mg  once a day in addition to your benefiber to help soften stool. If no bowel movement in 2-3 days can use miralax 1 capful in 8 oz of water.   Can hold off on doing labs until you come back for physical- that would be one full year from last cholesterol

## 2017-11-04 NOTE — Progress Notes (Signed)
Subjective:  Timothy Gallagher is a 81 y.o. year old very pleasant male patient who presents for/with See problem oriented charting ROS- No chest pain or shortness of breath. No headache or blurry vision.    Past Medical History-  Patient Active Problem List   Diagnosis Date Noted  . Detached retina, left 06/30/2017    Priority: High  . AAA (abdominal aortic aneurysm) ([place]) 05/12/2017    Priority: High  . CAD (coronary artery disease) of artery bypass graft 05/19/2014    Priority: High  . BPPV (benign paroxysmal positional vertigo) 09/01/2016    Priority: Medium  . Insomnia 11/22/2014    Priority: Medium  . Anemia 07/20/2014    Priority: Medium  . BPH (benign prostatic hyperplasia) 12/05/2013    Priority: Medium  . Hyperlipidemia 03/13/2008    Priority: Medium  . B12 deficiency 06/07/2007    Priority: Medium  . Essential hypertension 04/09/2007    Priority: Medium  . Left sided abdominal pain 04/15/2017    Priority: Low  . Low back pain 02/06/2017    Priority: Low  . Internal and external bleeding hemorrhoids 01/22/2015    Priority: Low  . Palpitations 02/11/2013    Priority: Low  . OSTEOARTHRITIS, WRIST, RIGHT 08/05/2010    Priority: Low  . ACNE ROSACEA 06/27/2009    Priority: Low  . ESOPHAGEAL STRICTURE 04/27/2009    Priority: Low  . ACTINIC KERATOSIS, HEAD 04/18/2009    Priority: Low  . NECK PAIN 09/14/2008    Priority: Low  . NEUROPATHY, IDIOPATHIC PERIPHERAL NEC 08/11/2007    Priority: Low  . Irritable bowel syndrome 08/11/2007    Priority: Low  . ALLERGIC RHINITIS 04/09/2007    Priority: Low  . Thrombocytopenia ([place]) 07/31/2017  . Anal fissure 04/07/2016    Medications- reviewed and updated Current Outpatient Medications  Medication Sig Dispense Refill  . acetaminophen (TYLENOL) 500 MG tablet Take 500 mg by mouth daily as needed (for pain). Reported on 04/11/2016    . aspirin 81 MG EC tablet Take 1 tablet (81 mg total) by mouth daily.    . cyanocobalamin  (,VITAMIN B-12,) 1000 MCG/ML injection Inject 1,000 mcg into the muscle every 30 (thirty) days.    . fluticasone (FLONASE) 50 MCG/ACT nasal spray USE 1 TO 2 SPRAYS IN EACH NOSTRIL DAILY AS NEEDED FOR ALLERGIES 16 g 3  . metoprolol succinate (TOPROL-XL) 25 MG 24 hr tablet Take 0.5 tablets (12.5 mg total) by mouth daily. 46 tablet 3  . Multiple Vitamins-Minerals (CENTRUM SILVER PO) Take 1 tablet by mouth daily.    [name] Kitchen zolpidem (AMBIEN) 5 MG tablet Take 1 tablet (5 mg total) at bedtime as needed by mouth. for sleep 30 tablet 5  . atorvastatin (LIPITOR) 20 MG tablet Take 1 tablet (20 mg total) by mouth daily. 90 tablet 3   No current facility-administered medications for this visit.     Objective: BP 136/76   Pulse 73   Temp (!) 97.3 F (36.3 C) (Oral)   Ht 6\' 1"  (1.854 m)   Wt 195 lb 3.2 oz (88.5 kg)   SpO2 98%   BMI 25.75 kg/m  Gen: NAD, resting comfortably CV: RRR no murmurs rubs or gallops Lungs: CTAB no crackles, wheeze, rhonchi Ext: no edema Skin: warm, dry Neuro: normal gait, able to stand up without using his hands  Assessment/Plan:  Hyperlipidemia S: well controlled on atorvastatin 20mg . No myalgias.  Lab Results  Component Value Date   CHOL 125 02/04/2017   HDL 44  02/04/2017   LDLCALC 65 02/04/2017   LDLDIRECT 91.0 05/23/2016   TRIG 79 02/04/2017   CHOLHDL 2.8 02/04/2017   A/P: patient comes in requesting updated lipid panel- we discussed insurance more likely to cover if full year from last lipid panel- in addition excellent control on last check with LDL under 70 at 65- continue current meds and recheck at physical in late march or early april  Irritable bowel syndrome S: dealing with constipation lately with rather firm stools. He has tried benefiber without relief A/P: from avs "Can also take colace/docusate 100mg  once a day in addition to your benefiber to help soften stool. If no bowel movement in 2-3 days can use miralax 1 capful in 8 oz of water. "   Future  Appointments  Date Time Provider [place]  12/23/2017  9:45 AM Gatha Mayer, MD [facility]  01/11/2018  1:15 PM [name], MD [facility]  03/08/2018  9:30 AM [name], MD [facility]   Return in about 4 months (around 03/05/2018) for physical, come fasting and we will update bloodwork.  Meds ordered this encounter  Medications  . atorvastatin (LIPITOR) 20 MG tablet    Sig: Take 1 tablet (20 mg total) by mouth daily.    Dispense:  90 tablet    Refill:  3    Return precautions advised.  [name], MD

## 2017-11-05 ENCOUNTER — Other Ambulatory Visit: Payer: Self-pay | Admitting: Family Medicine

## 2017-11-05 NOTE — Assessment & Plan Note (Signed)
S: well controlled on atorvastatin 20mg . No myalgias.  Lab Results  Component Value Date   CHOL 125 02/04/2017   HDL 44 02/04/2017   LDLCALC 65 02/04/2017   LDLDIRECT 91.0 05/23/2016   TRIG 79 02/04/2017   CHOLHDL 2.8 02/04/2017   A/P: patient comes in requesting updated lipid panel- we discussed insurance more likely to cover if full year from last lipid panel- in addition excellent control on last check with LDL under 70 at 65- continue current meds and recheck at physical in late march or early april

## 2017-11-05 NOTE — Assessment & Plan Note (Signed)
S: dealing with constipation lately with rather firm stools. He has tried benefiber without relief A/P: from avs "Can also take colace/docusate 100mg  once a day in addition to your benefiber to help soften stool. If no bowel movement in 2-3 days can use miralax 1 capful in 8 oz of water. "

## 2017-12-09 DIAGNOSIS — H3522 Other non-diabetic proliferative retinopathy, left eye: Secondary | ICD-10-CM | POA: Diagnosis not present

## 2017-12-09 DIAGNOSIS — H33052 Total retinal detachment, left eye: Secondary | ICD-10-CM | POA: Diagnosis not present

## 2017-12-15 ENCOUNTER — Telehealth: Payer: Self-pay | Admitting: Interventional Cardiology

## 2017-12-15 NOTE — Telephone Encounter (Signed)
48 hour Holter if irregular HR continues.

## 2017-12-15 NOTE — Telephone Encounter (Signed)
Patient c/o Palpitations:  High priority if patient c/o lightheadedness, shortness of breath, or chest pain  1) How long have you had palpitations/irregular HR/ Afib? Are you having the symptoms now? Today At 5am it started and no symptoms now  2) Are you currently experiencing lightheadedness, SOB or CP?  Sob   Do you have a history of afib (atrial fibrillation) or irregular heart rhythm? Unknown  3) Have you checked your BP or HR? (document readings if available):  98/72  Hr  126   108/76   Hr   114   94/60  Hr 68   97/64  Hr   106  4) Are you experiencing any other symptoms? Dizziness

## 2017-12-15 NOTE — Telephone Encounter (Signed)
Patient reports intermittent skipping heart beat this morning for about 2 hours. He states he'd have 2-3 really fast beats and then his HR would normalize. He noticed some SOB associated with the palpitations.  His last BP this morning was 97/64. HR=106 (he tried to catch this during an episode) He is taking his medications as directed - confirmed he took his Toprol this AM. He is currently asymptomatic and has not had any symptoms in 30 or so minutes. He says his HR is normal. He reports he drinks a couple cups of coffee qAM. Instructed him to avoid caffeine at this time. He understands Dr. [name] with be consulted and he will be called back with recommendations. The patient will call if symptoms worsen prior to that time.

## 2017-12-16 NOTE — Telephone Encounter (Signed)
Spoke with pt and made him aware of Dr. Thompson Caul recommendations.  Pt states he has not had any further issues since yesterday.  Advised to call if he starts to have these episodes more often and we can order the monitor.  Pt verbalized understanding and was in agreement with this plan.

## 2017-12-22 DIAGNOSIS — R351 Nocturia: Secondary | ICD-10-CM | POA: Diagnosis not present

## 2017-12-22 DIAGNOSIS — N400 Enlarged prostate without lower urinary tract symptoms: Secondary | ICD-10-CM | POA: Diagnosis not present

## 2017-12-23 ENCOUNTER — Ambulatory Visit: Payer: Medicare Other | Admitting: Internal Medicine

## 2017-12-28 ENCOUNTER — Ambulatory Visit: Payer: Medicare Other | Admitting: Family Medicine

## 2018-01-10 ENCOUNTER — Emergency Department (HOSPITAL_COMMUNITY): Payer: Medicare Other

## 2018-01-10 ENCOUNTER — Encounter (HOSPITAL_COMMUNITY): Payer: Self-pay | Admitting: Emergency Medicine

## 2018-01-10 ENCOUNTER — Emergency Department (HOSPITAL_COMMUNITY)
Admission: EM | Admit: 2018-01-10 | Discharge: 2018-01-10 | Disposition: A | Discharge status: Home / Self Care | Payer: Medicare Other | Attending: Emergency Medicine | Admitting: Emergency Medicine

## 2018-01-10 ENCOUNTER — Telehealth: Payer: Self-pay | Admitting: Physician Assistant

## 2018-01-10 ENCOUNTER — Other Ambulatory Visit: Payer: Self-pay

## 2018-01-10 DIAGNOSIS — I2510 Atherosclerotic heart disease of native coronary artery without angina pectoris: Secondary | ICD-10-CM | POA: Insufficient documentation

## 2018-01-10 DIAGNOSIS — Z87891 Personal history of nicotine dependence: Secondary | ICD-10-CM | POA: Insufficient documentation

## 2018-01-10 DIAGNOSIS — R42 Dizziness and giddiness: Secondary | ICD-10-CM | POA: Insufficient documentation

## 2018-01-10 DIAGNOSIS — Z85828 Personal history of other malignant neoplasm of skin: Secondary | ICD-10-CM | POA: Diagnosis not present

## 2018-01-10 DIAGNOSIS — N189 Chronic kidney disease, unspecified: Secondary | ICD-10-CM | POA: Insufficient documentation

## 2018-01-10 DIAGNOSIS — R7989 Other specified abnormal findings of blood chemistry: Secondary | ICD-10-CM | POA: Diagnosis not present

## 2018-01-10 DIAGNOSIS — Z7982 Long term (current) use of aspirin: Secondary | ICD-10-CM | POA: Insufficient documentation

## 2018-01-10 DIAGNOSIS — R002 Palpitations: Secondary | ICD-10-CM | POA: Insufficient documentation

## 2018-01-10 DIAGNOSIS — I493 Ventricular premature depolarization: Secondary | ICD-10-CM | POA: Insufficient documentation

## 2018-01-10 DIAGNOSIS — I129 Hypertensive chronic kidney disease with stage 1 through stage 4 chronic kidney disease, or unspecified chronic kidney disease: Secondary | ICD-10-CM | POA: Insufficient documentation

## 2018-01-10 DIAGNOSIS — Z951 Presence of aortocoronary bypass graft: Secondary | ICD-10-CM | POA: Insufficient documentation

## 2018-01-10 DIAGNOSIS — Z79899 Other long term (current) drug therapy: Secondary | ICD-10-CM | POA: Diagnosis not present

## 2018-01-10 DIAGNOSIS — R946 Abnormal results of thyroid function studies: Secondary | ICD-10-CM | POA: Diagnosis not present

## 2018-01-10 LAB — MAGNESIUM: Magnesium: 1.9 mg/dL (ref 1.7–2.4)

## 2018-01-10 LAB — BASIC METABOLIC PANEL
Anion gap: 10 (ref 5–15)
BUN: 18 mg/dL (ref 6–20)
CO2: 24 mmol/L (ref 22–32)
Calcium: 9.3 mg/dL (ref 8.9–10.3)
Chloride: 105 mmol/L (ref 101–111)
Creatinine, Ser: 1 mg/dL (ref 0.61–1.24)
GFR calc Af Amer: >60 mL/min (ref >60)
GFR calc non Af Amer: >60 mL/min (ref >60)
Glucose, Bld: 119 mg/dL — ABNORMAL HIGH (ref 65–99)
Potassium: 4.6 mmol/L (ref 3.5–5.1)
Sodium: 139 mmol/L (ref 135–145)

## 2018-01-10 LAB — CBC
HCT: 41.3 % (ref 39.0–52.0)
Hemoglobin: 13.1 g/dL (ref 13.0–17.0)
MCH: 26 pg (ref 26.0–34.0)
MCHC: 31.7 g/dL (ref 30.0–36.0)
MCV: 82.1 fL (ref 78.0–100.0)
Platelets: 164 10*3/uL (ref 150–400)
RBC: 5.03 MIL/uL (ref 4.22–5.81)
RDW: 14.7 % (ref 11.5–15.5)
WBC: 5.1 10*3/uL (ref 4.0–10.5)

## 2018-01-10 LAB — I-STAT TROPONIN, ED: Troponin i, poc: 0.01 ng/mL (ref 0.00–0.08)

## 2018-01-10 LAB — TSH: TSH: <0.01 u[IU]/mL (ref 0.350–4.500)

## 2018-01-10 MED ORDER — SODIUM CHLORIDE 0.9 % IV BOLUS (SEPSIS)
500.0000 mL | Freq: Once | INTRAVENOUS | Status: AC
  Administered 2018-01-10: 500 mL via INTRAVENOUS

## 2018-01-10 MED ORDER — SODIUM CHLORIDE 0.9 % IV BOLUS (SEPSIS): 500.0000 mL | Freq: Once | INTRAVENOUS | Status: DC

## 2018-01-10 NOTE — Discharge Instructions (Signed)
Please follow-up with your primary care doctor and your cardiologist.    Your TSH, thyroid-stimulating hormone, was very low today.  You have additional tests pending to help better classify if the thyroid function is too high or too low.  Please follow up with your doctor on these results.    Please call your cardiologist tomorrow for an appointment.  You may need a halter monitor.    Please avoid all caffeine as this can make palpitations worse.  Please do not take any nasal decongestants.

## 2018-01-10 NOTE — ED Triage Notes (Signed)
Pt. Stated, I started having irregular heartbeat and some dizziness this all started yesterday

## 2018-01-10 NOTE — ED Provider Notes (Signed)
[facility] EMERGENCY DEPARTMENT Provider Note   CSN: [id number] Arrival date & time: 01/10/18  9924     History   Chief Complaint Chief Complaint  Patient presents with  . Palpitations  . Dizziness    HPI CASYN BECVAR is a 82 y.o. male with a history of CABG x4, kidney disease, hypertension, hyperlipidemia, who presents today for evaluation of palpitations.  He reports that approximately 3 weeks ago he had an episode where he was aware of his heartbeat and felt like it was irregular.  He reports that that resolved without intervention, however it came back yesterday.  He reports that overnight while laying in bed he felt palpitations and got very dizzy while laying down.  He denies passing out.  He reports that he again had another episode of dizziness this morning and came here for evaluation.  He denies any chest pain or shortness of breath.  Currently he denies dizziness or headache.  He reports that when he feels his pulse in his wrist he can feel it is irregular.    HPI  Past Medical History:  Diagnosis Date  . ACNE ROSACEA 06/27/2009  . ACTINIC KERATOSIS, HEAD 04/18/2009  . Acute maxillary sinusitis 05/14/2010  . ALLERGIC RHINITIS 04/09/2007  . Anal fissure 04/07/2016  . B12 DEFICIENCY 06/07/2007  . BACK PAIN WITH RADICULOPATHY 04/24/2008  . Cancer ([place])    skin  . CHEST WALL PAIN, ACUTE 06/15/2009  . Chronic maxillary sinusitis 05/29/2008  . COLITIS 04/27/2009  . COLONIC POLYPS, HX OF 04/27/2009   tubular adenomas  . Coronary artery disease 05/12/2014   Cath 05/12/2014 w/ severe 3-vessel CAD and preserved LV function, EF 55%  . DERMATITIS, ATOPIC 10/12/2007  . DIVERTICULOSIS, COLON 04/27/2009  . ECCHYMOSES, SPONTANEOUS 06/27/2008  . Elevated sedimentation rate 05/02/2009  . ESOPHAGEAL STRICTURE 04/27/2009  . GASTRITIS, CHRONIC 04/27/2009  . HYPERLIPIDEMIA 03/13/2008  . HYPERTENSION 04/09/2007  . Internal bleeding hemorrhoids 01/22/2015   01/22/2015 Seen at  anoscopy, grade 1 all 3 positions   . Irritable bowel syndrome 08/11/2007  . KIDNEY DISEASE 04/09/2007  . NECK PAIN 09/14/2008  . NEUROPATHY, IDIOPATHIC PERIPHERAL NEC 08/11/2007  . OSTEOARTHRITIS, WRIST, RIGHT 08/05/2010  . Postoperative delirium 05/20/2014  . S/P CABG x 4 05/19/2014   LIMA to LAD, SVG to diag, SVG to OM, SVG to PDA, EVH via right thigh and leg    Patient Active Problem List   Diagnosis Date Noted  . Thrombocytopenia ([place]) 07/31/2017  . Detached retina, left 06/30/2017  . AAA (abdominal aortic aneurysm) ([place]) 05/12/2017  . Left sided abdominal pain 04/15/2017  . Low back pain 02/06/2017  . BPPV (benign paroxysmal positional vertigo) 09/01/2016  . Anal fissure 04/07/2016  . Internal and external bleeding hemorrhoids 01/22/2015  . Insomnia 11/22/2014  . Anemia 07/20/2014  . CAD (coronary artery disease) of artery bypass graft 05/19/2014  . BPH (benign prostatic hyperplasia) 12/05/2013  . Palpitations 02/11/2013  . OSTEOARTHRITIS, WRIST, RIGHT 08/05/2010  . ACNE ROSACEA 06/27/2009  . ESOPHAGEAL STRICTURE 04/27/2009  . ACTINIC KERATOSIS, HEAD 04/18/2009  . NECK PAIN 09/14/2008  . Hyperlipidemia 03/13/2008  . NEUROPATHY, IDIOPATHIC PERIPHERAL NEC 08/11/2007  . Irritable bowel syndrome 08/11/2007  . B12 deficiency 06/07/2007  . Essential hypertension 04/09/2007  . ALLERGIC RHINITIS 04/09/2007    Past Surgical History:  Procedure Laterality Date  . CARDIAC CATHETERIZATION    . COLONOSCOPY    . CORONARY ARTERY BYPASS GRAFT N/A 05/19/2014   Procedure: CORONARY ARTERY BYPASS GRAFTING (CABG);  Surgeon: Rexene Alberts, MD;  Location: [facility];  Service: Open Heart Surgery;  Laterality: N/A;  Times 4 using left internal mammary artery and endoscopically harvested right saphenous vein  . ESOPHAGOGASTRODUODENOSCOPY    . FINGER SURGERY     cut off end of finger  . FLEXIBLE SIGMOIDOSCOPY    . HEMORRHOID BANDING    . HERNIA REPAIR    . INCISION AND DRAINAGE WOUND WITH FOREIGN  BODY REMOVAL Left 12/20/2013   Procedure: INCISION AND DRAINAGE LEFT INDEX FINGER;  Surgeon: Tennis Must, MD;  Location: [facility];  Service: Orthopedics;  Laterality: Left;  . INTRAOPERATIVE TRANSESOPHAGEAL ECHOCARDIOGRAM N/A 05/19/2014   Procedure: INTRAOPERATIVE TRANSESOPHAGEAL ECHOCARDIOGRAM;  Surgeon: Rexene Alberts, MD;  Location: [facility];  Service: Open Heart Surgery;  Laterality: N/A;  . lamenectomy    . LEFT HEART CATHETERIZATION WITH CORONARY ANGIOGRAM N/A 05/12/2014   Procedure: LEFT HEART CATHETERIZATION WITH CORONARY ANGIOGRAM;  Surgeon: Sinclair Grooms, MD;  Location: [facility] C[facility] LAB;  Service: Cardiovascular;  Laterality: N/A;  . LUMBAR FUSION    . TONSILLECTOMY    . VARICOSE VEIN SURGERY Left        Home Medications    Prior to Admission medications   Medication Sig Start Date End Date Taking? Authorizing Provider  acetaminophen (TYLENOL) 500 MG tablet Take 500 mg by mouth daily as needed (for pain). Reported on 04/11/2016   Yes [provider]  aspirin 81 MG EC tablet Take 1 tablet (81 mg total) by mouth daily. Patient taking differently: Take 81 mg by mouth every evening.  06/19/15  Yes Belva Crome, MD  atorvastatin (LIPITOR) 20 MG tablet Take 1 tablet (20 mg total) by mouth daily. 11/04/17 02/02/18 Yes [name], MD  fluticasone (FLONASE) 50 MCG/ACT nasal spray SHAKE LIQUID AND USE 1 TO 2 SPRAYS IN EACH NOSTRIL DAILY AS NEEDED FOR ALLERGIES 11/05/17  Yes [name], MD  metoprolol succinate (TOPROL-XL) 25 MG 24 hr tablet Take 0.5 tablets (12.5 mg total) by mouth daily. 12/08/16  Yes [name], MD  Multiple Vitamins-Minerals (CENTRUM SILVER PO) Take 1 tablet by mouth daily.   Yes [provider]  tamsulosin (FLOMAX) 0.4 MG CAPS capsule Take 0.4 mg by mouth daily after supper.  12/22/17  Yes [provider]  zolpidem (AMBIEN) 5 MG tablet Take 1 tablet (5 mg total) at bedtime as needed by mouth. for sleep 10/06/17  Yes [name],  MD    Family History Family History  Problem Relation Age of Onset  . Hernia Mother   . Heart disease Father        smoker  . COPD Father   . Colon cancer Neg Hx     Social History Social History   Tobacco Use  . Smoking status: Former Smoker    Packs/day: 0.50    Years: 42.00    Pack years: 21.00    Types: Cigarettes    Last attempt to quit: 11/17/1998    Years since quitting: 19.1  . Smokeless tobacco: Never Used  Substance Use Topics  . Alcohol use: No  . Drug use: No     Allergies   Lipitor [atorvastatin]; Trazodone and nefazodone; Ciprofloxacin; Mycophenolate mofetil; Amoxicillin; Penicillins; and Rosuvastatin   Review of Systems Review of Systems  Constitutional: Negative for activity change, appetite change, chills, diaphoresis and fever.  Eyes: Negative for visual disturbance.  Respiratory: Negative for chest tightness and shortness of breath.   Cardiovascular: Positive  for palpitations. Negative for chest pain.  Gastrointestinal: Negative for nausea and vomiting.  Neurological: Positive for dizziness (Earlier, not now. ). Negative for speech difficulty, weakness and headaches.  All other systems reviewed and are negative.    Physical Exam Updated Vital Signs BP 125/71   Pulse (!) 56   Temp 97.8 F (36.6 C) (Oral)   Resp 16   Ht 6\' 1"  (1.854 m)   Wt 88.5 kg (195 lb)   SpO2 98%   BMI 25.73 kg/m   Physical Exam  Constitutional: He is oriented to person, place, and time. He appears well-developed and well-nourished. No distress.  HENT:  Head: Normocephalic and atraumatic.  Mouth/Throat: Oropharynx is clear and moist.  Eyes: Conjunctivae are normal.  Neck: Normal range of motion. Neck supple. No JVD present.  Cardiovascular: Normal rate, normal heart sounds, intact distal pulses and normal pulses. An irregular rhythm present. Frequent extrasystoles are present.  No murmur heard. Pulmonary/Chest: Effort normal and breath sounds normal. No stridor.  No respiratory distress. He has no wheezes. He exhibits no tenderness.  Abdominal: Soft. Bowel sounds are normal. He exhibits no distension. There is no tenderness.  Musculoskeletal: He exhibits no edema, tenderness or deformity.  Neurological: He is alert and oriented to person, place, and time.  Skin: Skin is warm and dry. He is not diaphoretic.  Psychiatric: He has a normal mood and affect. His behavior is normal.  Nursing note and vitals reviewed.    ED Treatments / Results  Labs (all labs ordered are listed, but only abnormal results are displayed) Labs Reviewed  BASIC METABOLIC PANEL - Abnormal; Notable for the following components:      Result Value   Glucose, Bld 119 (*)    All other components within normal limits  CBC  TSH  MAGNESIUM  T3, FREE  T4, FREE  I-STAT TROPONIN, ED    EKG  EKG Interpretation  Date/Time:  Sunday January 10 2018 09:02:46 EST Ventricular Rate:  83 PR Interval:  144 QRS Duration: 130 QT Interval:  410 QTC Calculation: 481 R Axis:   -44 Text Interpretation:  Sinus rhythm with frequent Premature ventricular complexes Left axis deviation Right bundle branch block Abnormal ECG frequent pvcs Otherwise no significant change Confirmed by Isla Pence [phone]) on 01/10/2018 9:42:22 AM       Radiology Dg Chest 2 View  Result Date: 01/10/2018 CLINICAL DATA:  Palpitations EXAM: CHEST  2 VIEW COMPARISON:  07/03/2014 chest radiograph. FINDINGS: Intact sternotomy wires. CABG clips overlie the mediastinum. Stable cardiomediastinal silhouette with normal heart size. No pneumothorax. No pleural effusion. Mildly hyperinflated lungs. No pulmonary edema. No acute consolidative airspace disease. IMPRESSION: No acute cardiopulmonary disease. Mildly hyperinflated lungs, suggesting COPD. Electronically Signed   By: Ilona Sorrel M.D.   On: 01/10/2018 09:31    Procedures Procedures (including critical care time)  Medications Ordered in ED Medications    sodium chloride 0.9 % bolus 500 mL (0 mLs Intravenous Stopped 01/10/18 1250)     Initial Impression / Assessment and Plan / ED Course  I have reviewed the triage vital signs and the nursing notes.  Pertinent labs & imaging results that were available during my care of the patient were reviewed by me and considered in my medical decision making (see chart for details).     Patient presents today for evaluation of palpitations, along with 2 episodes of feeling lightheaded and dizzy.  Basic labs were obtained with out significant electrolyte abnormalities.  He is not anemic,  normal white count.  Normal troponin.  TSH very low.  Sent T3 and T4, patient can follow up on with PCP.  EKG obtained and reviewed with multiple PVCs.  He was observed for multiple hours in the ED with out significant arrhythmia or deterioration.  Magnesium normal.  He was not having active chest pain, deferred a second troponin.  He was advised to avoid caffeine, stimulants and return if he develops chest pain, shortness of breath, repeat episode of dizziness or has any concerns.  He stated his understanding.  He was offered continued observation but stated he wished to leave.  Patient was seen and evaluated by Dr. Gilford Raid who agreed with my plan.    Final Clinical Impressions(s) / ED Diagnoses   Final diagnoses:  Palpitations  Frequent PVCs  Low TSH level    ED Discharge Orders    None       Ollen Gross 01/10/18 1632    Isla Pence, MD 01/13/18 [phone]

## 2018-01-10 NOTE — Telephone Encounter (Signed)
Patient having "skipped beats" since yesterday". He as dizzy last night but not this morning. No associated chest pain or SOB. SBP of low 100s to 120s. Hr of 80-100s. Last check 98/60 with HR of 83. He took 12.5mg  to Toprol XL this morning. Advised to take another one when SBP above 110. He will go to ER if worsening of symptoms otherwise will see me tomorrow at 2:30pm.

## 2018-01-11 ENCOUNTER — Ambulatory Visit: Payer: Medicare Other | Admitting: Family Medicine

## 2018-01-11 ENCOUNTER — Ambulatory Visit: Payer: Medicare Other | Admitting: Physician Assistant

## 2018-01-11 DIAGNOSIS — Z87438 Personal history of other diseases of male genital organs: Secondary | ICD-10-CM | POA: Diagnosis not present

## 2018-01-11 DIAGNOSIS — Z880 Allergy status to penicillin: Secondary | ICD-10-CM | POA: Diagnosis not present

## 2018-01-11 DIAGNOSIS — H31092 Other chorioretinal scars, left eye: Secondary | ICD-10-CM | POA: Diagnosis not present

## 2018-01-11 DIAGNOSIS — H3342 Traction detachment of retina, left eye: Secondary | ICD-10-CM | POA: Diagnosis not present

## 2018-01-11 DIAGNOSIS — Z961 Presence of intraocular lens: Secondary | ICD-10-CM | POA: Diagnosis not present

## 2018-01-11 DIAGNOSIS — H3522 Other non-diabetic proliferative retinopathy, left eye: Secondary | ICD-10-CM | POA: Diagnosis not present

## 2018-01-11 DIAGNOSIS — Z87891 Personal history of nicotine dependence: Secondary | ICD-10-CM | POA: Diagnosis not present

## 2018-01-11 DIAGNOSIS — Z79899 Other long term (current) drug therapy: Secondary | ICD-10-CM | POA: Diagnosis not present

## 2018-01-11 DIAGNOSIS — H3322 Serous retinal detachment, left eye: Secondary | ICD-10-CM | POA: Diagnosis not present

## 2018-01-11 DIAGNOSIS — Z951 Presence of aortocoronary bypass graft: Secondary | ICD-10-CM | POA: Diagnosis not present

## 2018-01-11 DIAGNOSIS — Z87440 Personal history of urinary (tract) infections: Secondary | ICD-10-CM | POA: Diagnosis not present

## 2018-01-11 DIAGNOSIS — H35372 Puckering of macula, left eye: Secondary | ICD-10-CM | POA: Diagnosis not present

## 2018-01-11 NOTE — Telephone Encounter (Signed)
Pt rescheduled appt to 3/13.  Do you want to change his Metoprolol or order a monitor in the meantime?

## 2018-01-12 ENCOUNTER — Ambulatory Visit: Payer: Medicare Other | Admitting: Family Medicine

## 2018-01-12 ENCOUNTER — Telehealth: Payer: Self-pay | Admitting: Family Medicine

## 2018-01-12 NOTE — Telephone Encounter (Signed)
Called patient and left a voicemail message asking for a return phone call. I did state patient can schedule for tomorrow as long as we have an opening.

## 2018-01-12 NOTE — Telephone Encounter (Signed)
See note.  Copied from [place]. Topic: Appointment Scheduling - Scheduling Inquiry for Clinic >> Jan 12, 2018  8:41 AM Corie Chiquito, [place] wrote: Reason for CRM: Patient had to cancel his appointment with Dr.Hunter @ 1:30 today. He would like to reschedule for tomorrow. He would like it if Roselyn Reef was to give him a call back. He can be reached at [phone].

## 2018-01-12 NOTE — Telephone Encounter (Signed)
Please do 48 hour holter

## 2018-01-13 ENCOUNTER — Ambulatory Visit: Payer: Medicare Other | Admitting: Family Medicine

## 2018-01-13 ENCOUNTER — Encounter: Payer: Self-pay | Admitting: Family Medicine

## 2018-01-13 VITALS — BP 132/74 | HR 64 | Temp 97.5°F | Ht 73.0 in | Wt 191.6 lb

## 2018-01-13 DIAGNOSIS — E041 Nontoxic single thyroid nodule: Secondary | ICD-10-CM

## 2018-01-13 DIAGNOSIS — E0590 Thyrotoxicosis, unspecified without thyrotoxic crisis or storm: Secondary | ICD-10-CM

## 2018-01-13 DIAGNOSIS — R002 Palpitations: Secondary | ICD-10-CM | POA: Diagnosis not present

## 2018-01-13 LAB — T4, FREE: Free T4: 0.74 ng/dL (ref 0.60–1.60)

## 2018-01-13 LAB — TSH: TSH: <0.01 u[IU]/mL — ABNORMAL LOW (ref 0.35–4.50)

## 2018-01-13 LAB — T3, FREE: T3, Free: 3.8 pg/mL (ref 2.3–4.2)

## 2018-01-13 NOTE — Telephone Encounter (Signed)
Left message to call back  

## 2018-01-13 NOTE — Telephone Encounter (Signed)
Spoke with pt and made him aware that Dr. [name] wanted to have pt wear a monitor.  Advised I would place order and have scheduler contact him with appt.  Pt verbalized understanding and was in agreement with this plan.

## 2018-01-13 NOTE — Assessment & Plan Note (Signed)
Likely secondary to increased PVCs secondary to new onset hyperthyroidism.  Given that his palpitations are improving, we do not need to make any further medication changes at this point.  Would consider increasing his dose of metoprolol succinate if palpations become more problematic however will continue 12.5 mg daily dose for the time being.  He has cardiology follow-up in 2 weeks.

## 2018-01-13 NOTE — Patient Instructions (Addendum)
It was very nice to meet you today.  We will not make any medication changes today.  We need to check blood work today.  We will also check an ultrasound of your thyroid.  I will also place a referral to endocrinology.  Take care, Dr Jerline Pain

## 2018-01-13 NOTE — Addendum Note (Signed)
Addended by: Loren Racer on: 01/13/2018 08:43 AM   Modules accepted: Orders

## 2018-01-13 NOTE — Assessment & Plan Note (Signed)
Patient has multiple nodules on his thyroid.  We will recheck his TSH and additionally check a T3 and T4 today.  We will place a referral to endocrinology.  I will also order a radioactive iodine uptake scan to further evaluate his thyroid nodules.  His vital signs are stable and is not having any other symptoms of hyperthyroidism.  Do not need to increase his dose of beta-blocker at this point.  Return precautions reviewed.

## 2018-01-13 NOTE — Progress Notes (Signed)
    Subjective:  Timothy Gallagher is a 82 y.o. male who presents today with a chief complaint of palpitations.   HPI:  Summary of ED Visit:  Pt presented to the ED on 01/10/1018 for palpitations. He was monitored in the ED and found to have frequent PVCs without any other arrhythmias noted.  Workup at that time also found a extremely low TSH-see below problem. Other work up was negative. Patient was deemed to be stable and discharged home.   Palpitations, established problem Patient with several year history of palpitations.  They worsened over the last week and he went to the emergency room on 01/10/2018 (see above summary).   Since being home over last couple days, his palpitations have improved.  Still has an occasional skipped beat.  No chest pain.  No shortness of breath.  No dizziness.  He will be seeing his cardiologist in 2 weeks.  He has cut out caffeine.  He does not take alcohol.  Hyperthyroidism, new problem As noted above, this was incidentally found during his ED visit 3 days ago.  TSH was found to be less than 0.010.  Aside from the palpitations, he denies any other symptoms.  No chest pain or shortness of breath.  No diarrhea.  No hair or skin changes.  No hot or cold intolerance.  He has a family history of thyroid disease-he is not sure if it is hypo-or hyperthyroidism.  No recent illnesses.  No neck pain.  ROS: Per HPI  PMH: He reports that he quit smoking about 19 years ago. His smoking use included cigarettes. He has a 21.00 pack-year smoking history. he has never used smokeless tobacco. He reports that he does not drink alcohol or use drugs.   Objective:  Physical Exam: BP 132/74 (BP Location: Left Arm, Patient Position: Sitting, Cuff Size: Normal)   Pulse 64   Temp (!) 97.5 F (36.4 C) (Oral)   Ht 6\' 1"  (1.854 m)   Wt 191 lb 9.6 oz (86.9 kg)   SpO2 98%   BMI 25.28 kg/m   Gen: NAD, resting comfortably HEENT: Multiple nontender nodules noted on thyroid gland  bilaterally. CV: RRR with no murmurs appreciated Pulm: NWOB, CTAB with no crackles, wheezes, or rhonchi GI: Normal bowel sounds present. Soft, Nontender, Nondistended.  Assessment/Plan:  Palpitations Likely secondary to increased PVCs secondary to new onset hyperthyroidism.  Given that his palpitations are improving, we do not need to make any further medication changes at this point.  Would consider increasing his dose of metoprolol succinate if palpations become more problematic however will continue 12.5 mg daily dose for the time being.  He has cardiology follow-up in 2 weeks.  Hyperthyroidism Patient has multiple nodules on his thyroid.  We will recheck his TSH and additionally check a T3 and T4 today.  We will place a referral to endocrinology.  I will also order a radioactive iodine uptake scan to further evaluate his thyroid nodules.  His vital signs are stable and is not having any other symptoms of hyperthyroidism.  Do not need to increase his dose of beta-blocker at this point.  Return precautions reviewed.  Algis Greenhouse. Jerline Pain, MD 01/13/2018 10:16 AM

## 2018-01-20 ENCOUNTER — Encounter: Payer: Self-pay | Admitting: Physician Assistant

## 2018-01-25 ENCOUNTER — Ambulatory Visit (INDEPENDENT_AMBULATORY_CARE_PROVIDER_SITE_OTHER): Payer: Medicare Other

## 2018-01-25 DIAGNOSIS — R002 Palpitations: Secondary | ICD-10-CM | POA: Diagnosis not present

## 2018-01-27 ENCOUNTER — Ambulatory Visit: Payer: Medicare Other | Admitting: Physician Assistant

## 2018-02-03 ENCOUNTER — Other Ambulatory Visit: Payer: Medicare Other | Admitting: *Deleted

## 2018-02-03 ENCOUNTER — Ambulatory Visit: Payer: Medicare Other | Admitting: Endocrinology

## 2018-02-03 ENCOUNTER — Encounter: Payer: Self-pay | Admitting: Endocrinology

## 2018-02-03 VITALS — BP 128/80 | HR 80 | Ht 73.0 in | Wt 191.0 lb

## 2018-02-03 DIAGNOSIS — E0590 Thyrotoxicosis, unspecified without thyrotoxic crisis or storm: Secondary | ICD-10-CM

## 2018-02-03 DIAGNOSIS — E785 Hyperlipidemia, unspecified: Secondary | ICD-10-CM | POA: Diagnosis not present

## 2018-02-03 DIAGNOSIS — I25810 Atherosclerosis of coronary artery bypass graft(s) without angina pectoris: Secondary | ICD-10-CM | POA: Diagnosis not present

## 2018-02-03 LAB — HEPATIC FUNCTION PANEL
ALT: 12 IU/L (ref 0–44)
AST: 20 IU/L (ref 0–40)
Albumin: 4 g/dL (ref 3.5–4.8)
Alkaline Phosphatase: 88 IU/L (ref 39–117)
Bilirubin Total: 0.4 mg/dL (ref 0.0–1.2)
Bilirubin, Direct: 0.13 mg/dL (ref 0.00–0.40)
Total Protein: 6.7 g/dL (ref 6.0–8.5)

## 2018-02-03 LAB — LIPID PANEL
Chol/HDL Ratio: 2.4 ratio (ref 0.0–5.0)
Cholesterol, Total: 120 mg/dL (ref 100–199)
HDL: 51 mg/dL (ref >39)
LDL Calculated: 57 mg/dL (ref 0–99)
Triglycerides: 58 mg/dL (ref 0–149)
VLDL Cholesterol Cal: 12 mg/dL (ref 5–40)

## 2018-02-03 LAB — TSH: TSH: 0.07 u[IU]/mL — ABNORMAL LOW (ref 0.35–4.50)

## 2018-02-03 LAB — T4, FREE: Free T4: 0.71 ng/dL (ref 0.60–1.60)

## 2018-02-03 NOTE — Progress Notes (Signed)
Subjective:    Patient ID: Timothy Gallagher, male    DOB: [birth year], 82 y.o.   MRN: [id number]  HPI Pt is referred by Dr Yong Channel, for hyperthyroidism.  Pt reports he was dx'ed with hyperthyroidism in early 2019.  He has never been on therapy for this.  He has never had XRT to the anterior neck, or thyroid surgery.  He has never had thyroid imaging.  He does not consume kelp or any other non-prescribed thyroid medication.  He has never been on amiodarone.  He has slight palpitations in the chest, and slight assoc sob.  Since on metoprolol, sxs are improved, but still present.  Past Medical History:  Diagnosis Date  . ACNE ROSACEA 06/27/2009  . ACTINIC KERATOSIS, HEAD 04/18/2009  . Acute maxillary sinusitis 05/14/2010  . ALLERGIC RHINITIS 04/09/2007  . Anal fissure 04/07/2016  . B12 DEFICIENCY 06/07/2007  . BACK PAIN WITH RADICULOPATHY 04/24/2008  . Cancer ([place])    skin  . CHEST WALL PAIN, ACUTE 06/15/2009  . Chronic maxillary sinusitis 05/29/2008  . COLITIS 04/27/2009  . COLONIC POLYPS, HX OF 04/27/2009   tubular adenomas  . Coronary artery disease 05/12/2014   Cath 05/12/2014 w/ severe 3-vessel CAD and preserved LV function, EF 55%  . DERMATITIS, ATOPIC 10/12/2007  . DIVERTICULOSIS, COLON 04/27/2009  . ECCHYMOSES, SPONTANEOUS 06/27/2008  . Elevated sedimentation rate 05/02/2009  . ESOPHAGEAL STRICTURE 04/27/2009  . GASTRITIS, CHRONIC 04/27/2009  . HYPERLIPIDEMIA 03/13/2008  . HYPERTENSION 04/09/2007  . Internal bleeding hemorrhoids 01/22/2015   01/22/2015 Seen at anoscopy, grade 1 all 3 positions   . Irritable bowel syndrome 08/11/2007  . KIDNEY DISEASE 04/09/2007  . NECK PAIN 09/14/2008  . NEUROPATHY, IDIOPATHIC PERIPHERAL NEC 08/11/2007  . OSTEOARTHRITIS, WRIST, RIGHT 08/05/2010  . Postoperative delirium 05/20/2014  . S/P CABG x 4 05/19/2014   LIMA to LAD, SVG to diag, SVG to OM, SVG to PDA, EVH via right thigh and leg    Past Surgical History:  Procedure Laterality Date  . CARDIAC CATHETERIZATION      . COLONOSCOPY    . CORONARY ARTERY BYPASS GRAFT N/A 05/19/2014   Procedure: CORONARY ARTERY BYPASS GRAFTING (CABG);  Surgeon: Rexene Alberts, MD;  Location: [place];  Service: Open Heart Surgery;  Laterality: N/A;  Times 4 using left internal mammary artery and endoscopically harvested right saphenous vein  . ESOPHAGOGASTRODUODENOSCOPY    . FINGER SURGERY     cut off end of finger  . FLEXIBLE SIGMOIDOSCOPY    . HEMORRHOID BANDING    . HERNIA REPAIR    . INCISION AND DRAINAGE WOUND WITH FOREIGN BODY REMOVAL Left 12/20/2013   Procedure: INCISION AND DRAINAGE LEFT INDEX FINGER;  Surgeon: Tennis Must, MD;  Location: [facility];  Service: Orthopedics;  Laterality: Left;  . INTRAOPERATIVE TRANSESOPHAGEAL ECHOCARDIOGRAM N/A 05/19/2014   Procedure: INTRAOPERATIVE TRANSESOPHAGEAL ECHOCARDIOGRAM;  Surgeon: Rexene Alberts, MD;  Location: [place] [facility];  Service: Open Heart Surgery;  Laterality: N/A;  . lamenectomy    . LEFT HEART CATHETERIZATION WITH CORONARY ANGIOGRAM N/A 05/12/2014   Procedure: LEFT HEART CATHETERIZATION WITH CORONARY ANGIOGRAM;  Surgeon: Sinclair Grooms, MD;  Location: [facility] CATH LAB;  Service: Cardiovascular;  Laterality: N/A;  . LUMBAR FUSION    . TONSILLECTOMY    . VARICOSE VEIN SURGERY Left     Social History   Socioeconomic History  . Marital status: Married    Spouse name: Not on file  . Number of children: 4  . Years of  education: Not on file  . Highest education level: Not on file  Occupational History  . Occupation: retired    Fish farm manager: RETIRED    Comment: from [employer]  . Financial resource strain: Not on file  . Food insecurity:    Worry: Not on file    Inability: Not on file  . Transportation needs:    Medical: Not on file    Non-medical: Not on file  Tobacco Use  . Smoking status: Former Smoker    Packs/day: 0.50    Years: 42.00    Pack years: 21.00    Types: Cigarettes    Last attempt to quit: 11/17/1998    Years since quitting: 19.2  . Smokeless  tobacco: Never Used  Substance and Sexual Activity  . Alcohol use: No  . Drug use: No  . Sexual activity: Yes  Lifestyle  . Physical activity:    Days per week: Not on file    Minutes per session: Not on file  . Stress: Not on file  Relationships  . Social connections:    Talks on phone: Not on file    Gets together: Not on file    Attends religious service: Not on file    Active member of club or organization: Not on file    Attends meetings of clubs or organizations: Not on file    Relationship status: Not on file  . Intimate partner violence:    Fear of current or ex partner: Not on file    Emotionally abused: Not on file    Physically abused: Not on file    Forced sexual activity: Not on file  Other Topics Concern  . Not on file  Social History Narrative   Married 35 years (2nd marriage). 2 kids from previous marriage. 2 stepchildren. 8 grandkids.       Retired from [employer]: walking/exercise, building in shop-furniture (cut finger off in February doing this)   6 grand children - Has bought them all a new car    Brother died of probably addiction   Sister also died; not sure of cause    He enjoys his life; Likes to play the keyboard; guitar    Had played in a gospel quartet     Current Outpatient Medications on File Prior to Visit  Medication Sig Dispense Refill  . acetaminophen (TYLENOL) 500 MG tablet Take 500 mg by mouth daily as needed (for pain). Reported on 04/11/2016    . aspirin 81 MG EC tablet Take 1 tablet (81 mg total) by mouth daily. (Patient taking differently: Take 81 mg by mouth every evening. )    . fluticasone (FLONASE) 50 MCG/ACT nasal spray SHAKE LIQUID AND USE 1 TO 2 SPRAYS IN EACH NOSTRIL DAILY AS NEEDED FOR ALLERGIES 16 g 5  . metoprolol succinate (TOPROL-XL) 25 MG 24 hr tablet Take 0.5 tablets (12.5 mg total) by mouth daily. 46 tablet 3  . Multiple Vitamins-Minerals (CENTRUM SILVER PO) Take 1 tablet by mouth daily.     . tamsulosin (FLOMAX) 0.4 MG CAPS capsule Take 0.4 mg by mouth daily after supper.   3  . zolpidem (AMBIEN) 5 MG tablet Take 1 tablet (5 mg total) at bedtime as needed by mouth. for sleep 30 tablet 5  . atorvastatin (LIPITOR) 20 MG tablet Take 1 tablet (20 mg total) by mouth daily. 90 tablet 3   No current facility-administered medications on file prior to visit.  Allergies  Allergen Reactions  . Lipitor [Atorvastatin] Other (See Comments)    REACTION: nausea and blurred vision  . Trazodone And Nefazodone Other (See Comments)    dizzy  . Ciprofloxacin Swelling  . Mycophenolate Mofetil Other (See Comments)    REACTION: unspecified  . Amoxicillin Rash    Has patient had a PCN reaction causing immediate rash, facial/tongue/throat swelling, SOB or lightheadedness with hypotension: Unknown Has patient had a PCN reaction causing severe rash involving mucus membranes or skin necrosis: Unknown Has patient had a PCN reaction that required hospitalization: Unknown Has patient had a PCN reaction occurring within the last 10 years: Unknown If all of the above answers are "NO", then may proceed with Cephalosporin use.   [name] Kitchen Penicillins Rash    Has patient had a PCN reaction causing immediate rash, facial/tongue/throat swelling, SOB or lightheadedness with hypotension: Unknown Has patient had a PCN reaction causing severe rash involving mucus membranes or skin necrosis: Unknown Has patient had a PCN reaction that required hospitalization: Unknown Has patient had a PCN reaction occurring within the last 10 years: Unknown If all of the above answers are "NO", then may proceed with Cephalosporin use.   . Rosuvastatin Other (See Comments)    Unknown     Family History  Problem Relation Age of Onset  . Hernia Mother   . Heart disease Father        smoker  . COPD Father   . Hyperthyroidism Son   . Colon cancer Neg Hx     BP 128/80   Pulse 80   Ht 6\' 1"  (1.854 m)   Wt 191 lb (86.6 kg)    SpO2 98%   BMI 25.20 kg/m    Review of Systems denies weight loss, headache, hoarseness, diplopia, edema, cough diarrhea, polyuria, muscle weakness, excessive diaphoresis, tremor, anxiety, heat intolerance, easy bruising, and rhinorrhea.       Objective:   Physical Exam VS: see vs page GEN: no distress HEAD: head: no deformity eyes: no periorbital swelling, no proptosis external nose and ears are normal mouth: no lesion seen NECK: supple, thyroid is not enlarged CHEST WALL: no deformity LUNGS: clear to auscultation CV: reg rate and rhythm, no murmur ABD: abdomen is soft, nontender.  no hepatosplenomegaly.  not distended.  no hernia MUSCULOSKELETAL: muscle bulk and strength are grossly normal.  no obvious joint swelling.  gait is normal and steady EXTEMITIES: no deformity.  no edema PULSES: no carotid bruit NEURO:  cn 2-12 grossly intact.   readily moves all 4's.  sensation is intact to touch on all 4's.  Moderate tremor of the hands.   SKIN:  Normal texture and temperature.  No rash or suspicious lesion is visible.  Not diaphoretic.  NODES:  None palpable at the neck.   PSYCH: alert, well-oriented.  Does not appear anxious nor depressed.    Lab Results  Component Value Date   TSH <0.01 Repeated and verified X2. (L) 01/13/2018   I personally reviewed electrocardiogram tracing (01/11/18): Indication: palpitations Impression: NSR with occ pvc.  No MI.  No hypertrophy.   Compared to 2018: RBBB is better      Assessment & Plan:  Hyperthyroidism, new to me.   Patient Instructions  blood tests are requested for you today.  We'll let you know about the results. If it is still high, we would next check a thyroid "scan" (a special, but easy and painless type of thyroid x ray).  It works like this: you go  to the x-ray department of the hospital to swallow a pill, which contains a miniscule amount of radiation.  You will not notice any symptoms from this.  You will go back to the  x-ray department the next day, to lie down in front of a camera.  The results of this will be sent to me.   Based on the results, i hope to order for you a treatment pill of radioactive iodine.  Although it is a larger amount of radiation, you will again notice no symptoms from this.  The pill is gone from your body in a few days (during which you should stay away from other people), but takes several months to work.  Therefore, please return here approximately 6-8 weeks after the treatment.  This treatment has been available for many years, and the only known side-effect is an underactive thyroid.  It is possible that i would eventually prescribe for you a thyroid hormone pill, which is very inexpensive.  You don't have to worry about side-effects of this thyroid hormone pill, because it is the same molecule your thyroid makes.

## 2018-02-03 NOTE — Patient Instructions (Addendum)
blood tests are requested for you today.  We'll let you know about the results. If it is still high, we would next check a thyroid "scan" (a special, but easy and painless type of thyroid x ray).  It works like this: you go to the x-ray department of the hospital to swallow a pill, which contains a miniscule amount of radiation.  You will not notice any symptoms from this.  You will go back to the x-ray department the next day, to lie down in front of a camera.  The results of this will be sent to me.   Based on the results, i hope to order for you a treatment pill of radioactive iodine.  Although it is a larger amount of radiation, you will again notice no symptoms from this.  The pill is gone from your body in a few days (during which you should stay away from other people), but takes several months to work.  Therefore, please return here approximately 6-8 weeks after the treatment.  This treatment has been available for many years, and the only known side-effect is an underactive thyroid.  It is possible that i would eventually prescribe for you a thyroid hormone pill, which is very inexpensive.  You don't have to worry about side-effects of this thyroid hormone pill, because it is the same molecule your thyroid makes.

## 2018-02-08 NOTE — Progress Notes (Deleted)
Cardiology Office Note    Date:  02/08/2018   ID:  Timothy Gallagher, DOB 1936-07-04, MRN [id number]  PCP:  [name], MD  Cardiologist:  Dr. [name]   Chief Complaint: Palpitations  History of Present Illness:   Timothy Gallagher is a 82 y.o. male with hx of coronary artery disease s/p CABG (LIMA-LAD, SVG-PDA, SVG-OM, SVG-diagonal) by Dr. [id number], hyperlipidemia, hypertension, AAA 3.3cm and history of kidney disease presents for evaluation of palpitations.   He was doing well on cardiac stand point without angina when last seen by Dr. [name] 10/2017.  At baseline the patient has intermittent palpitations (aysmptomatic) over the years however is got worsen in Jan 2019. Indecently he was found to have TSH  less than 0.010 consistence with hyperthyroidism. The patient was seen by Endocrinologist 01/2018 a    Past Medical History:  Diagnosis Date  . ACNE ROSACEA 06/27/2009  . ACTINIC KERATOSIS, HEAD 04/18/2009  . Acute maxillary sinusitis 05/14/2010  . ALLERGIC RHINITIS 04/09/2007  . Anal fissure 04/07/2016  . B12 DEFICIENCY 06/07/2007  . BACK PAIN WITH RADICULOPATHY 04/24/2008  . Cancer ([place])    skin  . CHEST WALL PAIN, ACUTE 06/15/2009  . Chronic maxillary sinusitis 05/29/2008  . COLITIS 04/27/2009  . COLONIC POLYPS, HX OF 04/27/2009   tubular adenomas  . Coronary artery disease 05/12/2014   Cath 05/12/2014 w/ severe 3-vessel CAD and preserved LV function, EF 55%  . DERMATITIS, ATOPIC 10/12/2007  . DIVERTICULOSIS, COLON 04/27/2009  . ECCHYMOSES, SPONTANEOUS 06/27/2008  . Elevated sedimentation rate 05/02/2009  . ESOPHAGEAL STRICTURE 04/27/2009  . GASTRITIS, CHRONIC 04/27/2009  . HYPERLIPIDEMIA 03/13/2008  . HYPERTENSION 04/09/2007  . Internal bleeding hemorrhoids 01/22/2015   01/22/2015 Seen at anoscopy, grade 1 all 3 positions   . Irritable bowel syndrome 08/11/2007  . KIDNEY DISEASE 04/09/2007  . NECK PAIN 09/14/2008  . NEUROPATHY, IDIOPATHIC PERIPHERAL NEC 08/11/2007  . OSTEOARTHRITIS,  WRIST, RIGHT 08/05/2010  . Postoperative delirium 05/20/2014  . S/P CABG x 4 05/19/2014   LIMA to LAD, SVG to diag, SVG to OM, SVG to PDA, EVH via right thigh and leg    Past Surgical History:  Procedure Laterality Date  . CARDIAC CATHETERIZATION    . COLONOSCOPY    . CORONARY ARTERY BYPASS GRAFT N/A 05/19/2014   Procedure: CORONARY ARTERY BYPASS GRAFTING (CABG);  Surgeon: Rexene Alberts, MD;  Location: [place];  Service: Open Heart Surgery;  Laterality: N/A;  Times 4 using left internal mammary artery and endoscopically harvested right saphenous vein  . ESOPHAGOGASTRODUODENOSCOPY    . FINGER SURGERY     cut off end of finger  . FLEXIBLE SIGMOIDOSCOPY    . HEMORRHOID BANDING    . HERNIA REPAIR    . INCISION AND DRAINAGE WOUND WITH FOREIGN BODY REMOVAL Left 12/20/2013   Procedure: INCISION AND DRAINAGE LEFT INDEX FINGER;  Surgeon: Tennis Must, MD;  Location: [facility];  Service: Orthopedics;  Laterality: Left;  . INTRAOPERATIVE TRANSESOPHAGEAL ECHOCARDIOGRAM N/A 05/19/2014   Procedure: INTRAOPERATIVE TRANSESOPHAGEAL ECHOCARDIOGRAM;  Surgeon: Rexene Alberts, MD;  Location: [place];  Service: Open Heart Surgery;  Laterality: N/A;  . lamenectomy    . LEFT HEART CATHETERIZATION WITH CORONARY ANGIOGRAM N/A 05/12/2014   Procedure: LEFT HEART CATHETERIZATION WITH CORONARY ANGIOGRAM;  Surgeon: Sinclair Grooms, MD;  Location: [facility];  Service: Cardiovascular;  Laterality: N/A;  . LUMBAR FUSION    . TONSILLECTOMY    . VARICOSE VEIN SURGERY Left  Current Medications: Prior to Admission medications   Medication Sig Start Date End Date Taking? Authorizing Provider  acetaminophen (TYLENOL) 500 MG tablet Take 500 mg by mouth daily as needed (for pain). Reported on 04/11/2016    [provider]  aspirin 81 MG EC tablet Take 1 tablet (81 mg total) by mouth daily. Patient taking differently: Take 81 mg by mouth every evening.  06/19/15   Belva Crome, MD  atorvastatin (LIPITOR) 20 MG tablet Take  1 tablet (20 mg total) by mouth daily. 11/04/17 02/02/18  [name], MD  fluticasone (FLONASE) 50 MCG/ACT nasal spray SHAKE LIQUID AND USE 1 TO 2 SPRAYS IN EACH NOSTRIL DAILY AS NEEDED FOR ALLERGIES 11/05/17   [name], MD  metoprolol succinate (TOPROL-XL) 25 MG 24 hr tablet Take 0.5 tablets (12.5 mg total) by mouth daily. 12/08/16   [name], MD  Multiple Vitamins-Minerals (CENTRUM SILVER PO) Take 1 tablet by mouth daily.    [provider]  tamsulosin (FLOMAX) 0.4 MG CAPS capsule Take 0.4 mg by mouth daily after supper.  12/22/17   [provider]  zolpidem (AMBIEN) 5 MG tablet Take 1 tablet (5 mg total) at bedtime as needed by mouth. for sleep 10/06/17   [name], MD    Allergies:   Lipitor [atorvastatin]; Trazodone and nefazodone; Ciprofloxacin; Mycophenolate mofetil; Amoxicillin; Penicillins; and Rosuvastatin   Social History   Socioeconomic History  . Marital status: Married    Spouse name: Not on file  . Number of children: 4  . Years of education: Not on file  . Highest education level: Not on file  Occupational History  . Occupation: retired    Fish farm manager: RETIRED    Comment: from [employer]  . Financial resource strain: Not on file  . Food insecurity:    Worry: Not on file    Inability: Not on file  . Transportation needs:    Medical: Not on file    Non-medical: Not on file  Tobacco Use  . Smoking status: Former Smoker    Packs/day: 0.50    Years: 42.00    Pack years: 21.00    Types: Cigarettes    Last attempt to quit: 11/17/1998    Years since quitting: 19.2  . Smokeless tobacco: Never Used  Substance and Sexual Activity  . Alcohol use: No  . Drug use: No  . Sexual activity: Yes  Lifestyle  . Physical activity:    Days per week: Not on file    Minutes per session: Not on file  . Stress: Not on file  Relationships  . Social connections:    Talks on phone: Not on file    Gets together: Not on file     Attends religious service: Not on file    Active member of club or organization: Not on file    Attends meetings of clubs or organizations: Not on file    Relationship status: Not on file  Other Topics Concern  . Not on file  Social History Narrative   Married 35 years (2nd marriage). 2 kids from previous marriage. 2 stepchildren. 8 grandkids.       Retired from [employer]: walking/exercise, building in shop-furniture (cut finger off in February doing this)   6 grand children - Has bought them all a new car    Brother died of probably addiction   Sister also died; not sure of cause  He enjoys his life; Likes to play the keyboard; guitar    Had played in a gospel quartet      Family History:  The patient's family history includes COPD in his father; Heart disease in his father; Hernia in his mother; Hyperthyroidism in his son. ***  ROS:   Please see the history of present illness.    ROS All other systems reviewed and are negative.   PHYSICAL EXAM:   VS:  There were no vitals taken for this visit.   GEN: Well nourished, well developed, in no acute distress  HEENT: normal  Neck: no JVD, carotid bruits, or masses Cardiac: ***RRR; no murmurs, rubs, or gallops,no edema  Respiratory:  clear to auscultation bilaterally, normal work of breathing GI: soft, nontender, nondistended, + BS MS: no deformity or atrophy  Skin: warm and dry, no rash Neuro:  Alert and Oriented x 3, Strength and sensation are intact Psych: euthymic mood, full affect  Wt Readings from Last 3 Encounters:  02/03/18 191 lb (86.6 kg)  01/13/18 191 lb 9.6 oz (86.9 kg)  01/10/18 195 lb (88.5 kg)      Studies/Labs Reviewed:   EKG:  EKG is ordered today.  The ekg ordered today demonstrates ***  Recent Labs: 01/10/2018: BUN 18; Creatinine, Ser 1.00; Hemoglobin 13.1; Magnesium 1.9; Platelets 164; Potassium 4.6; Sodium 139 02/03/2018: ALT 12; TSH 0.07   Lipid Panel    Component  Value Date/Time   CHOL 120 02/03/2018 0857   TRIG 58 02/03/2018 0857   HDL 51 02/03/2018 0857   CHOLHDL 2.4 02/03/2018 0857   CHOLHDL 3.2 08/11/2016 0850   VLDL 20 08/11/2016 0850   LDLCALC 57 02/03/2018 0857   LDLDIRECT 91.0 05/23/2016 1346    Additional studies/ records that were reviewed today include:   Echocardiogram:  Cardiac Catheterization:     ASSESSMENT & PLAN:    1. ***    Medication Adjustments/Labs and Tests Ordered: Current medicines are reviewed at length with the patient today.  Concerns regarding medicines are outlined above.  Medication changes, Labs and Tests ordered today are listed in the Patient Instructions below. There are no Patient Instructions on file for this visit.   Jarrett Soho, [place]  02/08/2018 9:02 AM    [address], [place], [place]  [address] Phone: [phone]; Fax: [phone]

## 2018-02-09 ENCOUNTER — Ambulatory Visit: Payer: Medicare Other | Admitting: Physician Assistant

## 2018-02-09 ENCOUNTER — Other Ambulatory Visit: Payer: Medicare Other

## 2018-02-17 ENCOUNTER — Other Ambulatory Visit (INDEPENDENT_AMBULATORY_CARE_PROVIDER_SITE_OTHER): Payer: Medicare Other

## 2018-02-17 ENCOUNTER — Other Ambulatory Visit: Payer: Self-pay | Admitting: Endocrinology

## 2018-02-17 DIAGNOSIS — E0590 Thyrotoxicosis, unspecified without thyrotoxic crisis or storm: Secondary | ICD-10-CM | POA: Diagnosis not present

## 2018-02-17 LAB — TSH: TSH: 0.26 u[IU]/mL — ABNORMAL LOW (ref 0.35–4.50)

## 2018-02-17 LAB — T4, FREE: Free T4: 0.55 ng/dL — ABNORMAL LOW (ref 0.60–1.60)

## 2018-02-18 DIAGNOSIS — H3522 Other non-diabetic proliferative retinopathy, left eye: Secondary | ICD-10-CM | POA: Diagnosis not present

## 2018-02-19 ENCOUNTER — Telehealth: Payer: Self-pay

## 2018-02-19 NOTE — Telephone Encounter (Signed)
Patient scheduled for Monday at 1:00

## 2018-02-19 NOTE — Telephone Encounter (Signed)
Copied from [place] [phone]. Topic: Appointment Scheduling - Scheduling Inquiry for Clinic >> Feb 19, 2018  8:07 AM Synthia Innocent wrote: Reason for CRM: Pulled muscle in right side of chest and is requesting to see Dr Yong Channel, declined to see another provider. Work in? Please advise

## 2018-02-22 ENCOUNTER — Ambulatory Visit: Payer: Medicare Other | Admitting: Family Medicine

## 2018-02-23 ENCOUNTER — Ambulatory Visit: Payer: Medicare Other | Admitting: Internal Medicine

## 2018-03-04 ENCOUNTER — Other Ambulatory Visit: Payer: Self-pay | Admitting: Family Medicine

## 2018-03-04 ENCOUNTER — Ambulatory Visit (INDEPENDENT_AMBULATORY_CARE_PROVIDER_SITE_OTHER): Payer: Medicare Other | Admitting: Endocrinology

## 2018-03-04 ENCOUNTER — Encounter: Payer: Self-pay | Admitting: Endocrinology

## 2018-03-04 VITALS — BP 138/68 | HR 60 | Resp 16 | Wt 192.0 lb

## 2018-03-04 DIAGNOSIS — E0590 Thyrotoxicosis, unspecified without thyrotoxic crisis or storm: Secondary | ICD-10-CM | POA: Diagnosis not present

## 2018-03-04 LAB — T4, FREE: Free T4: 0.61 ng/dL (ref 0.60–1.60)

## 2018-03-04 LAB — TSH: TSH: 0.24 u[IU]/mL — ABNORMAL LOW (ref 0.35–4.50)

## 2018-03-04 MED ORDER — METOPROLOL SUCCINATE ER 25 MG PO TB24: ORAL_TABLET | ORAL | 1 refills | Status: DC

## 2018-03-04 NOTE — Progress Notes (Signed)
Subjective:    Patient ID: Timothy Gallagher, male    DOB: 1936/09/10, 82 y.o.   MRN: [id number]  HPI Pt returns for f/u of hyperthyroidism (dx'ed early 2019; he has never been on therapy for this, as f/u TFT have improved; he has never had thyroid imaging; he takes toprol for CAD, not thyroid).  pt states he feels well in general. Past Medical History:  Diagnosis Date  . ACNE ROSACEA 06/27/2009  . ACTINIC KERATOSIS, HEAD 04/18/2009  . Acute maxillary sinusitis 05/14/2010  . ALLERGIC RHINITIS 04/09/2007  . Anal fissure 04/07/2016  . B12 DEFICIENCY 06/07/2007  . BACK PAIN WITH RADICULOPATHY 04/24/2008  . Cancer ([place])    skin  . CHEST WALL PAIN, ACUTE 06/15/2009  . Chronic maxillary sinusitis 05/29/2008  . COLITIS 04/27/2009  . COLONIC POLYPS, HX OF 04/27/2009   tubular adenomas  . Coronary artery disease 05/12/2014   Cath 05/12/2014 w/ severe 3-vessel CAD and preserved LV function, EF 55%  . DERMATITIS, ATOPIC 10/12/2007  . DIVERTICULOSIS, COLON 04/27/2009  . ECCHYMOSES, SPONTANEOUS 06/27/2008  . Elevated sedimentation rate 05/02/2009  . ESOPHAGEAL STRICTURE 04/27/2009  . GASTRITIS, CHRONIC 04/27/2009  . HYPERLIPIDEMIA 03/13/2008  . HYPERTENSION 04/09/2007  . Internal bleeding hemorrhoids 01/22/2015   01/22/2015 Seen at anoscopy, grade 1 all 3 positions   . Irritable bowel syndrome 08/11/2007  . KIDNEY DISEASE 04/09/2007  . NECK PAIN 09/14/2008  . NEUROPATHY, IDIOPATHIC PERIPHERAL NEC 08/11/2007  . OSTEOARTHRITIS, WRIST, RIGHT 08/05/2010  . Postoperative delirium 05/20/2014  . S/P CABG x 4 05/19/2014   LIMA to LAD, SVG to diag, SVG to OM, SVG to PDA, EVH via right thigh and leg    Past Surgical History:  Procedure Laterality Date  . CARDIAC CATHETERIZATION    . COLONOSCOPY    . CORONARY ARTERY BYPASS GRAFT N/A 05/19/2014   Procedure: CORONARY ARTERY BYPASS GRAFTING (CABG);  Surgeon: Rexene Alberts, MD;  Location: [place];  Service: Open Heart Surgery;  Laterality: N/A;  Times 4 using left internal mammary  artery and endoscopically harvested right saphenous vein  . ESOPHAGOGASTRODUODENOSCOPY    . FINGER SURGERY     cut off end of finger  . FLEXIBLE SIGMOIDOSCOPY    . HEMORRHOID BANDING    . HERNIA REPAIR    . INCISION AND DRAINAGE WOUND WITH FOREIGN BODY REMOVAL Left 12/20/2013   Procedure: INCISION AND DRAINAGE LEFT INDEX FINGER;  Surgeon: Tennis Must, MD;  Location: [facility];  Service: Orthopedics;  Laterality: Left;  . INTRAOPERATIVE TRANSESOPHAGEAL ECHOCARDIOGRAM N/A 05/19/2014   Procedure: INTRAOPERATIVE TRANSESOPHAGEAL ECHOCARDIOGRAM;  Surgeon: Rexene Alberts, MD;  Location: [place];  Service: Open Heart Surgery;  Laterality: N/A;  . lamenectomy    . LEFT HEART CATHETERIZATION WITH CORONARY ANGIOGRAM N/A 05/12/2014   Procedure: LEFT HEART CATHETERIZATION WITH CORONARY ANGIOGRAM;  Surgeon: Sinclair Grooms, MD;  Location: [facility] C[facility];  Service: Cardiovascular;  Laterality: N/A;  . LUMBAR FUSION    . TONSILLECTOMY    . VARICOSE VEIN SURGERY Left     Social History   Socioeconomic History  . Marital status: Married    Spouse name: Not on file  . Number of children: 4  . Years of education: Not on file  . Highest education level: Not on file  Occupational History  . Occupation: retired    Fish farm manager: RETIRED    Comment: from [employer]  . Financial resource strain: Not on file  . Food insecurity:  Worry: Not on file    Inability: Not on file  . Transportation needs:    Medical: Not on file    Non-medical: Not on file  Tobacco Use  . Smoking status: Former Smoker    Packs/day: 0.50    Years: 42.00    Pack years: 21.00    Types: Cigarettes    Last attempt to quit: 11/17/1998    Years since quitting: 19.3  . Smokeless tobacco: Never Used  Substance and Sexual Activity  . Alcohol use: No  . Drug use: No  . Sexual activity: Yes  Lifestyle  . Physical activity:    Days per week: Not on file    Minutes per session: Not on file  . Stress: Not on file    Relationships  . Social connections:    Talks on phone: Not on file    Gets together: Not on file    Attends religious service: Not on file    Active member of club or organization: Not on file    Attends meetings of clubs or organizations: Not on file    Relationship status: Not on file  . Intimate partner violence:    Fear of current or ex partner: Not on file    Emotionally abused: Not on file    Physically abused: Not on file    Forced sexual activity: Not on file  Other Topics Concern  . Not on file  Social History Narrative   Married 35 years (2nd marriage). 2 kids from previous marriage. 2 stepchildren. 8 grandkids.       Retired from [employer]: walking/exercise, building in shop-furniture (cut finger off in February doing this)   6 grand children - Has bought them all a new car    Brother died of probably addiction   Sister also died; not sure of cause    He enjoys his life; Likes to play the keyboard; guitar    Had played in a gospel quartet     Current Outpatient Medications on File Prior to Visit  Medication Sig Dispense Refill  . acetaminophen (TYLENOL) 500 MG tablet Take 500 mg by mouth daily as needed (for pain). Reported on 04/11/2016    . aspirin 81 MG EC tablet Take 1 tablet (81 mg total) by mouth daily. (Patient taking differently: Take 81 mg by mouth every evening. )    . fluticasone (FLONASE) 50 MCG/ACT nasal spray SHAKE LIQUID AND USE 1 TO 2 SPRAYS IN EACH NOSTRIL DAILY AS NEEDED FOR ALLERGIES 16 g 5  . Multiple Vitamins-Minerals (CENTRUM SILVER PO) Take 1 tablet by mouth daily.    . tamsulosin (FLOMAX) 0.4 MG CAPS capsule Take 0.4 mg by mouth daily after supper.   3  . zolpidem (AMBIEN) 5 MG tablet Take 1 tablet (5 mg total) at bedtime as needed by mouth. for sleep 30 tablet 5  . atorvastatin (LIPITOR) 20 MG tablet Take 1 tablet (20 mg total) by mouth daily. 90 tablet 3   No current facility-administered medications on file  prior to visit.     Allergies  Allergen Reactions  . Lipitor [Atorvastatin] Other (See Comments)    REACTION: nausea and blurred vision  . Trazodone And Nefazodone Other (See Comments)    dizzy  . Ciprofloxacin Swelling  . Mycophenolate Mofetil Other (See Comments)    REACTION: unspecified  . Amoxicillin Rash    Has patient had a PCN reaction causing immediate rash, facial/tongue/throat swelling,  SOB or lightheadedness with hypotension: Unknown Has patient had a PCN reaction causing severe rash involving mucus membranes or skin necrosis: Unknown Has patient had a PCN reaction that required hospitalization: Unknown Has patient had a PCN reaction occurring within the last 10 years: Unknown If all of the above answers are "NO", then may proceed with Cephalosporin use.   [name] Kitchen Penicillins Rash    Has patient had a PCN reaction causing immediate rash, facial/tongue/throat swelling, SOB or lightheadedness with hypotension: Unknown Has patient had a PCN reaction causing severe rash involving mucus membranes or skin necrosis: Unknown Has patient had a PCN reaction that required hospitalization: Unknown Has patient had a PCN reaction occurring within the last 10 years: Unknown If all of the above answers are "NO", then may proceed with Cephalosporin use.   . Rosuvastatin Other (See Comments)    Unknown     Family History  Problem Relation Age of Onset  . Hernia Mother   . Heart disease Father        smoker  . COPD Father   . Hyperthyroidism Son   . Colon cancer Neg Hx     BP 138/68   Pulse 60   Resp 16   Wt 192 lb (87.1 kg)   SpO2 98%   BMI 25.33 kg/m   Review of Systems Denies palpitations.     Objective:   Physical Exam VITAL SIGNS:  See vs page GENERAL: no distress NECK: There is no palpable thyroid enlargement.  No thyroid nodule is palpable.  No palpable lymphadenopathy at the anterior neck.   Lab Results  Component Value Date   TSH 0.26 (L) 02/17/2018        Assessment & Plan:  Hyperthyroidism: uncertain etiology.  Due to spontaneous improvement, we'll continue to follow.  Borderline bradycardia: we discussed.  Please continue the same metoprolol for now  Patient Instructions  You no longer need the metoprolol for the thyroid.  However, Dr Yong Channel or [name] may want you to continue. blood tests are requested for you today.  We'll let you know about the results.   We still don't know the cause of the thyroid abnormality.  We'll have to continue to follow it over time.   Please come back for a follow-up appointment in 1 month.

## 2018-03-04 NOTE — Patient Instructions (Addendum)
You no longer need the metoprolol for the thyroid.  However, Dr Yong Channel or [name] may want you to continue. blood tests are requested for you today.  We'll let you know about the results.   We still don't know the cause of the thyroid abnormality.  We'll have to continue to follow it over time.   Please come back for a follow-up appointment in 1 month.

## 2018-03-05 ENCOUNTER — Ambulatory Visit: Payer: Self-pay | Admitting: *Deleted

## 2018-03-05 NOTE — Telephone Encounter (Signed)
Contacted pt and made him aware of the [facility] at [facility]; pt offered and accepted appointment with Dr Clearance Coots at 1115; pt verbalizes understanding.

## 2018-03-05 NOTE — Telephone Encounter (Signed)
Pt called with hurting on the right side of his chest for 3 weeks; the pain is intermittent; he thinks he pulled a muscle; recommendations made per protocol to include seeing a physiain within 24 hours; pt given options of offered appointment at [facility], or going to urgent care or ED; he declines and states that the pain is easing off since he took some tylenol; he also states that he will wait to see Dr Yong Channel at his previously scheduled appointment on 03/08/18.  Reason for Disposition . [1] Chest pain lasting <= 5 minutes AND [2] NO chest pain or cardiac symptoms now(Exceptions: pains lasting a few seconds)  Answer Assessment - Initial Assessment Questions 1. LOCATION: "Where does it hurt?"       Right side of chest; described as sharp, burning  2. RADIATION: "Does the pain go anywhere else?" (e.g., into neck, jaw, arms, back)     no 3. ONSET: "When did the chest pain begin?" (Minutes, hours or days)      Weeks ago 4. PATTERN "Does the pain come and go, or has it been constant since it started?"  "Does it get worse with exertion?"      intermittent 5. DURATION: "How long does it last" (e.g., seconds, minutes, hours)     minutes 6. SEVERITY: "How bad is the pain?"  (e.g., Scale 1-10; mild, moderate, or severe)    - MILD (1-3): doesn't interfere with normal activities     - MODERATE (4-7): interferes with normal activities or awakens from sleep    - SEVERE (8-10): excruciating pain, unable to do any normal activities       moderate 7. CARDIAC RISK FACTORS: "Do you have any history of heart problems or risk factors for heart disease?" (e.g., prior heart attack, angina; high blood pressure, diabetes, being overweight, high cholesterol, smoking, or strong family history of heart disease)     Bypass surgery 8. PULMONARY RISK FACTORS: "Do you have any history of lung disease?"  (e.g., blood clots in lung, asthma, emphysema, birth control pills)     no 9. CAUSE: "What do you think is causing the  chest pain?"     Not sure 10. OTHER SYMPTOMS: "Do you have any other symptoms?" (e.g., dizziness, nausea, vomiting, sweating, fever, difficulty breathing, cough)       \no 11. PREGNANCY: "Is there any chance you are pregnant?" "When was your last menstrual period?"       n/a  Protocols used: CHEST PAIN-A-AH

## 2018-03-06 ENCOUNTER — Ambulatory Visit: Payer: Medicare Other | Admitting: Family Medicine

## 2018-03-06 ENCOUNTER — Encounter: Payer: Self-pay | Admitting: Family Medicine

## 2018-03-06 VITALS — BP 142/84 | HR 62 | Temp 97.9°F | Ht 73.0 in | Wt 195.0 lb

## 2018-03-06 DIAGNOSIS — R079 Chest pain, unspecified: Secondary | ICD-10-CM | POA: Diagnosis not present

## 2018-03-06 NOTE — Assessment & Plan Note (Signed)
Atypical in nature.  Having pain to palpation.  No reproduction with muscular movements.  Possible for  costochondritis.  Atypical chest pain possible. -Advised to be seen in the emergency department but he deferred today. -Counseled on supportive care. -Has follow-up with primary doctor on Monday and provided indications on when to seek immediate care.

## 2018-03-06 NOTE — Progress Notes (Signed)
Timothy Gallagher - 82 y.o. male MRN [id number]  Date of birth: June 19, 1936  SUBJECTIVE:  Including CC & ROS.  Chief Complaint  Patient presents with  . Right lateral pain    Timothy Gallagher is a 82 y.o. male that is presenting with right lateral chest wall pain. Ongoing three weeks. He noticed it one morning. Pain is located upper chest and radiates right lateral aspect. Pain is intermittent. Pain is described as a burning sensation. Pain is more noticeable when he is not active. Denies movements exacerbate the pain. He has been taking tylenol and Aspercreme. Denies injury.  He felt the pain had improved today.  Bypass was in 2015.   Review of Systems  Constitutional: Negative for fever.  HENT: Negative for congestion.   Respiratory: Negative for cough.   Cardiovascular: Positive for chest pain.  Gastrointestinal: Negative for abdominal pain.  Musculoskeletal: Negative for back pain.  Skin: Negative for color change.    HISTORY: Past Medical, Surgical, Social, and Family History Reviewed & Updated per EMR.   Pertinent Historical Findings include:  Past Medical History:  Diagnosis Date  . ACNE ROSACEA 06/27/2009  . ACTINIC KERATOSIS, HEAD 04/18/2009  . Acute maxillary sinusitis 05/14/2010  . ALLERGIC RHINITIS 04/09/2007  . Anal fissure 04/07/2016  . B12 DEFICIENCY 06/07/2007  . BACK PAIN WITH RADICULOPATHY 04/24/2008  . Cancer ([place])    skin  . CHEST WALL PAIN, ACUTE 06/15/2009  . Chronic maxillary sinusitis 05/29/2008  . COLITIS 04/27/2009  . COLONIC POLYPS, HX OF 04/27/2009   tubular adenomas  . Coronary artery disease 05/12/2014   Cath 05/12/2014 w/ severe 3-vessel CAD and preserved LV function, EF 55%  . DERMATITIS, ATOPIC 10/12/2007  . DIVERTICULOSIS, COLON 04/27/2009  . ECCHYMOSES, SPONTANEOUS 06/27/2008  . Elevated sedimentation rate 05/02/2009  . ESOPHAGEAL STRICTURE 04/27/2009  . GASTRITIS, CHRONIC 04/27/2009  . HYPERLIPIDEMIA 03/13/2008  . HYPERTENSION 04/09/2007  . Internal  bleeding hemorrhoids 01/22/2015   01/22/2015 Seen at anoscopy, grade 1 all 3 positions   . Irritable bowel syndrome 08/11/2007  . KIDNEY DISEASE 04/09/2007  . NECK PAIN 09/14/2008  . NEUROPATHY, IDIOPATHIC PERIPHERAL NEC 08/11/2007  . OSTEOARTHRITIS, WRIST, RIGHT 08/05/2010  . Postoperative delirium 05/20/2014  . S/P CABG x 4 05/19/2014   LIMA to LAD, SVG to diag, SVG to OM, SVG to PDA, EVH via right thigh and leg    Past Surgical History:  Procedure Laterality Date  . CARDIAC CATHETERIZATION    . COLONOSCOPY    . CORONARY ARTERY BYPASS GRAFT N/A 05/19/2014   Procedure: CORONARY ARTERY BYPASS GRAFTING (CABG);  Surgeon: Rexene Alberts, MD;  Location: [place];  Service: Open Heart Surgery;  Laterality: N/A;  Times 4 using left internal mammary artery and endoscopically harvested right saphenous vein  . ESOPHAGOGASTRODUODENOSCOPY    . FINGER SURGERY     cut off end of finger  . FLEXIBLE SIGMOIDOSCOPY    . HEMORRHOID BANDING    . HERNIA REPAIR    . INCISION AND DRAINAGE WOUND WITH FOREIGN BODY REMOVAL Left 12/20/2013   Procedure: INCISION AND DRAINAGE LEFT INDEX FINGER;  Surgeon: Tennis Must, MD;  Location: [facility];  Service: Orthopedics;  Laterality: Left;  . INTRAOPERATIVE TRANSESOPHAGEAL ECHOCARDIOGRAM N/A 05/19/2014   Procedure: INTRAOPERATIVE TRANSESOPHAGEAL ECHOCARDIOGRAM;  Surgeon: Rexene Alberts, MD;  Location: [place];  Service: Open Heart Surgery;  Laterality: N/A;  . lamenectomy    . LEFT HEART CATHETERIZATION WITH CORONARY ANGIOGRAM N/A 05/12/2014   Procedure: LEFT HEART CATHETERIZATION  WITH CORONARY ANGIOGRAM;  Surgeon: Sinclair Grooms, MD;  Location: [facility] CATH LAB;  Service: Cardiovascular;  Laterality: N/A;  . LUMBAR FUSION    . TONSILLECTOMY    . VARICOSE VEIN SURGERY Left     Allergies  Allergen Reactions  . Lipitor [Atorvastatin] Other (See Comments)    REACTION: nausea and blurred vision  . Trazodone And Nefazodone Other (See Comments)    dizzy  . Ciprofloxacin Swelling  .  Mycophenolate Mofetil Other (See Comments)    REACTION: unspecified  . Amoxicillin Rash    Has patient had a PCN reaction causing immediate rash, facial/tongue/throat swelling, SOB or lightheadedness with hypotension: Unknown Has patient had a PCN reaction causing severe rash involving mucus membranes or skin necrosis: Unknown Has patient had a PCN reaction that required hospitalization: Unknown Has patient had a PCN reaction occurring within the last 10 years: Unknown If all of the above answers are "NO", then may proceed with Cephalosporin use.   [name] Kitchen Penicillins Rash    Has patient had a PCN reaction causing immediate rash, facial/tongue/throat swelling, SOB or lightheadedness with hypotension: Unknown Has patient had a PCN reaction causing severe rash involving mucus membranes or skin necrosis: Unknown Has patient had a PCN reaction that required hospitalization: Unknown Has patient had a PCN reaction occurring within the last 10 years: Unknown If all of the above answers are "NO", then may proceed with Cephalosporin use.   . Rosuvastatin Other (See Comments)    Unknown     Family History  Problem Relation Age of Onset  . Hernia Mother   . Heart disease Father        smoker  . COPD Father   . Hyperthyroidism Son   . Colon cancer Neg Hx      Social History   Socioeconomic History  . Marital status: Married    Spouse name: Not on file  . Number of children: 4  . Years of education: Not on file  . Highest education level: Not on file  Occupational History  . Occupation: retired    Fish farm manager: RETIRED    Comment: from [employer]  . Financial resource strain: Not on file  . Food insecurity:    Worry: Not on file    Inability: Not on file  . Transportation needs:    Medical: Not on file    Non-medical: Not on file  Tobacco Use  . Smoking status: Former Smoker    Packs/day: 0.50    Years: 42.00    Pack years: 21.00    Types: Cigarettes    Last attempt to  quit: 11/17/1998    Years since quitting: 19.3  . Smokeless tobacco: Never Used  Substance and Sexual Activity  . Alcohol use: No  . Drug use: No  . Sexual activity: Yes  Lifestyle  . Physical activity:    Days per week: Not on file    Minutes per session: Not on file  . Stress: Not on file  Relationships  . Social connections:    Talks on phone: Not on file    Gets together: Not on file    Attends religious service: Not on file    Active member of club or organization: Not on file    Attends meetings of clubs or organizations: Not on file    Relationship status: Not on file  . Intimate partner violence:    Fear of current or ex partner: Not on file    Emotionally  abused: Not on file    Physically abused: Not on file    Forced sexual activity: Not on file  Other Topics Concern  . Not on file  Social History Narrative   Married 35 years (2nd marriage). 2 kids from previous marriage. 2 stepchildren. 8 grandkids.       Retired from [employer]: walking/exercise, building in shop-furniture (cut finger off in February doing this)   6 grand children - Has bought them all a new car    Brother died of probably addiction   Sister also died; not sure of cause    He enjoys his life; Likes to play the keyboard; guitar    Had played in a gospel quartet      PHYSICAL EXAM:  VS: BP (!) 142/84 (BP Location: Left Arm, Patient Position: Sitting, Cuff Size: Normal)   Pulse 62   Temp 97.9 F (36.6 C) (Oral)   Ht 6\' 1"  (1.854 m)   Wt 195 lb (88.5 kg)   SpO2 98%   BMI 25.73 kg/m  Physical Exam Gen: NAD, alert, cooperative with exam, well-appearing ENT: normal lips, normal nasal mucosa,  Eye: normal EOM, normal conjunctiva and lids CV:  no edema, +2 pedal pulses, regular rate and rhythm, S1-S2   Resp: no accessory muscle use, non-labored, clear to auscultation bilaterally, no crackles or wheezes  Skin: no rashes, no areas of induration  Neuro: normal  tone, normal sensation to touch Psych:  normal insight, alert and oriented MSK: Normal gait, normal strength  Tenderness to palpation over the right chest. Normal right shoulder range of motion. No pain to pushing or pulling. Neurovascular intact      ASSESSMENT & PLAN:   Chest pain Atypical in nature.  Having pain to palpation.  No reproduction with muscular movements.  Possible for  costochondritis.  Atypical chest pain possible. -Advised to be seen in the emergency department but he deferred today. -Counseled on supportive care. -Has follow-up with primary doctor on Monday and provided indications on when to seek immediate care.

## 2018-03-06 NOTE — Patient Instructions (Signed)
Please seek immediate care if your symptoms worsen  Please try Aspercreme with lidocaine for the pain.   Costochondritis Costochondritis is swelling and irritation (inflammation) of the tissue (cartilage) that connects your ribs to your breastbone (sternum). This causes pain in the front of your chest. Usually, the pain:  Starts gradually.  Is in more than one rib.  This condition usually goes away on its own over time. Follow these instructions at home:  Do not do anything that makes your pain worse.  If directed, put ice on the painful area: ? Put ice in a plastic bag. ? Place a towel between your skin and the bag. ? Leave the ice on for 20 minutes, 2-3 times a day.  If directed, put heat on the affected area as often as told by your doctor. Use the heat source that your doctor tells you to use, such as a moist heat pack or a heating pad. ? Place a towel between your skin and the heat source. ? Leave the heat on for 20-30 minutes. ? Take off the heat if your skin turns bright red. This is very important if you cannot feel pain, heat, or cold. You may have a greater risk of getting burned.  Take over-the-counter and prescription medicines only as told by your doctor.  Return to your normal activities as told by your doctor. Ask your doctor what activities are safe for you.  Keep all follow-up visits as told by your doctor. This is important. Contact a doctor if:  You have chills or a fever.  Your pain does not go away or it gets worse.  You have a cough that does not go away. Get help right away if:  You are short of breath. This information is not intended to replace advice given to you by your health care provider. Make sure you discuss any questions you have with your health care provider. Document Released: 04/21/2008 Document Revised: 05/23/2016 Document Reviewed: 02/27/2016 Elsevier Interactive Patient Education  [employer].

## 2018-03-08 ENCOUNTER — Encounter: Payer: Self-pay | Admitting: Family Medicine

## 2018-03-08 ENCOUNTER — Ambulatory Visit (INDEPENDENT_AMBULATORY_CARE_PROVIDER_SITE_OTHER): Payer: Medicare Other | Admitting: Family Medicine

## 2018-03-08 VITALS — BP 122/76 | HR 65 | Temp 97.5°F | Ht 73.0 in | Wt 193.2 lb

## 2018-03-08 DIAGNOSIS — I10 Essential (primary) hypertension: Secondary | ICD-10-CM

## 2018-03-08 DIAGNOSIS — Z0001 Encounter for general adult medical examination with abnormal findings: Secondary | ICD-10-CM

## 2018-03-08 DIAGNOSIS — E785 Hyperlipidemia, unspecified: Secondary | ICD-10-CM

## 2018-03-08 DIAGNOSIS — I25810 Atherosclerosis of coronary artery bypass graft(s) without angina pectoris: Secondary | ICD-10-CM

## 2018-03-08 DIAGNOSIS — R079 Chest pain, unspecified: Secondary | ICD-10-CM | POA: Diagnosis not present

## 2018-03-08 DIAGNOSIS — E0590 Thyrotoxicosis, unspecified without thyrotoxic crisis or storm: Secondary | ICD-10-CM

## 2018-03-08 DIAGNOSIS — I714 Abdominal aortic aneurysm, without rupture, unspecified: Secondary | ICD-10-CM

## 2018-03-08 DIAGNOSIS — G4700 Insomnia, unspecified: Secondary | ICD-10-CM | POA: Diagnosis not present

## 2018-03-08 DIAGNOSIS — Z0000 Encounter for general adult medical examination without abnormal findings: Secondary | ICD-10-CM

## 2018-03-08 DIAGNOSIS — E538 Deficiency of other specified B group vitamins: Secondary | ICD-10-CM | POA: Diagnosis not present

## 2018-03-08 MED ORDER — PREDNISONE 20 MG PO TABS: ORAL_TABLET | ORAL | 0 refills | Status: DC

## 2018-03-08 NOTE — Assessment & Plan Note (Signed)
Chest pain in patient with CAD history and CABG 2015 (anticipated next functional test 2020 as long as no issues per Dr. [name])  3 weeks ago was out building a few porch swings. Noted a few days later - some soreness in the right chest going around to the right back. Getting better. No exertional chest pain. Worse with pushing on the area.  About 5/10. No exertional pain. Not relieved by rest.  Strongly suspect costochondritis. No signs of cardiac involvement. EKG reassuring. Will trial prednisone low dose 7 day course. Want to avoid nsaids with CAD history.

## 2018-03-08 NOTE — Patient Instructions (Addendum)
discussed Tdap (if gets cut or scrape we can give this to you here) and shingrix at pharmacy  No changes today other than 7 day trial of prednisone for the right chest. If not improving within 2-3 weeks or if new or worsening symptoms see [place] immediately

## 2018-03-08 NOTE — Assessment & Plan Note (Signed)
History of low b12- received injections at least through last year- not sure why these were stopped. We will check b12 levels. May need to restart vs. Oral b12

## 2018-03-08 NOTE — Assessment & Plan Note (Signed)
Hypertension- on metoprolol 12.5mg  daily, doing well on repeat check today. Was having some anxiety around ekg and seemed to improve after reassured on test

## 2018-03-08 NOTE — Progress Notes (Signed)
Phone: [phone]  Subjective:  Patient presents today for their annual physical. Chief complaint-noted.   See problem oriented charting- ROS- full  review of systems was completed and negative except for: right sided chest pain worse with palpation  The following were reviewed and entered/updated in epic: Past Medical History:  Diagnosis Date  . ACNE ROSACEA 06/27/2009  . ACTINIC KERATOSIS, HEAD 04/18/2009  . Acute maxillary sinusitis 05/14/2010  . ALLERGIC RHINITIS 04/09/2007  . Anal fissure 04/07/2016  . B12 DEFICIENCY 06/07/2007  . BACK PAIN WITH RADICULOPATHY 04/24/2008  . Cancer ([place])    skin  . CHEST WALL PAIN, ACUTE 06/15/2009  . Chronic maxillary sinusitis 05/29/2008  . COLITIS 04/27/2009  . COLONIC POLYPS, HX OF 04/27/2009   tubular adenomas  . Coronary artery disease 05/12/2014   Cath 05/12/2014 w/ severe 3-vessel CAD and preserved LV function, EF 55%  . DERMATITIS, ATOPIC 10/12/2007  . DIVERTICULOSIS, COLON 04/27/2009  . ECCHYMOSES, SPONTANEOUS 06/27/2008  . Elevated sedimentation rate 05/02/2009  . ESOPHAGEAL STRICTURE 04/27/2009  . GASTRITIS, CHRONIC 04/27/2009  . HYPERLIPIDEMIA 03/13/2008  . HYPERTENSION 04/09/2007  . Internal bleeding hemorrhoids 01/22/2015   01/22/2015 Seen at anoscopy, grade 1 all 3 positions   . Irritable bowel syndrome 08/11/2007  . KIDNEY DISEASE 04/09/2007  . NECK PAIN 09/14/2008  . NEUROPATHY, IDIOPATHIC PERIPHERAL NEC 08/11/2007  . OSTEOARTHRITIS, WRIST, RIGHT 08/05/2010  . Postoperative delirium 05/20/2014  . S/P CABG x 4 05/19/2014   LIMA to LAD, SVG to diag, SVG to OM, SVG to PDA, EVH via right thigh and leg   Patient Active Problem List   Diagnosis Date Noted  . Hyperthyroidism 01/13/2018    Priority: High  . Detached retina, left 06/30/2017    Priority: High  . AAA (abdominal aortic aneurysm) ([place]) 05/12/2017    Priority: High  . CAD (coronary artery disease) of artery bypass graft 05/19/2014    Priority: High  . BPPV (benign paroxysmal  positional vertigo) 09/01/2016    Priority: Medium  . Insomnia 11/22/2014    Priority: Medium  . Anemia 07/20/2014    Priority: Medium  . BPH (benign prostatic hyperplasia) 12/05/2013    Priority: Medium  . Hyperlipidemia 03/13/2008    Priority: Medium  . B12 deficiency 06/07/2007    Priority: Medium  . Essential hypertension 04/09/2007    Priority: Medium  . Left sided abdominal pain 04/15/2017    Priority: Low  . Low back pain 02/06/2017    Priority: Low  . Anal fissure 04/07/2016    Priority: Low  . Internal and external bleeding hemorrhoids 01/22/2015    Priority: Low  . Palpitations 02/11/2013    Priority: Low  . OSTEOARTHRITIS, WRIST, RIGHT 08/05/2010    Priority: Low  . ACNE ROSACEA 06/27/2009    Priority: Low  . ESOPHAGEAL STRICTURE 04/27/2009    Priority: Low  . ACTINIC KERATOSIS, HEAD 04/18/2009    Priority: Low  . NECK PAIN 09/14/2008    Priority: Low  . NEUROPATHY, IDIOPATHIC PERIPHERAL NEC 08/11/2007    Priority: Low  . Irritable bowel syndrome 08/11/2007    Priority: Low  . ALLERGIC RHINITIS 04/09/2007    Priority: Low  . Chest pain 03/06/2018   Past Surgical History:  Procedure Laterality Date  . CARDIAC CATHETERIZATION    . COLONOSCOPY    . CORONARY ARTERY BYPASS GRAFT N/A 05/19/2014   Procedure: CORONARY ARTERY BYPASS GRAFTING (CABG);  Surgeon: Rexene Alberts, MD;  Location: [facility];  Service: Open Heart Surgery;  Laterality: N/A;  Times 4 using left internal mammary artery and endoscopically harvested right saphenous vein  . ESOPHAGOGASTRODUODENOSCOPY    . FINGER SURGERY     cut off end of finger  . FLEXIBLE SIGMOIDOSCOPY    . HEMORRHOID BANDING    . HERNIA REPAIR    . INCISION AND DRAINAGE WOUND WITH FOREIGN BODY REMOVAL Left 12/20/2013   Procedure: INCISION AND DRAINAGE LEFT INDEX FINGER;  Surgeon: Tennis Must, MD;  Location: [facility];  Service: Orthopedics;  Laterality: Left;  . INTRAOPERATIVE TRANSESOPHAGEAL ECHOCARDIOGRAM N/A 05/19/2014    Procedure: INTRAOPERATIVE TRANSESOPHAGEAL ECHOCARDIOGRAM;  Surgeon: Rexene Alberts, MD;  Location: [facility];  Service: Open Heart Surgery;  Laterality: N/A;  . lamenectomy    . LEFT HEART CATHETERIZATION WITH CORONARY ANGIOGRAM N/A 05/12/2014   Procedure: LEFT HEART CATHETERIZATION WITH CORONARY ANGIOGRAM;  Surgeon: Sinclair Grooms, MD;  Location: [facility] C[facility];  Service: Cardiovascular;  Laterality: N/A;  . LUMBAR FUSION    . TONSILLECTOMY    . VARICOSE VEIN SURGERY Left     Family History  Problem Relation Age of Onset  . Hernia Mother   . Heart disease Father        smoker  . COPD Father   . Hyperthyroidism Son   . Colon cancer Neg Hx     Medications- reviewed and updated Current Outpatient Medications  Medication Sig Dispense Refill  . acetaminophen (TYLENOL) 500 MG tablet Take 500 mg by mouth daily as needed (for pain). Reported on 04/11/2016    . aspirin 81 MG EC tablet Take 1 tablet (81 mg total) by mouth daily. (Patient taking differently: Take 81 mg by mouth every evening. )    . fluticasone (FLONASE) 50 MCG/ACT nasal spray SHAKE LIQUID AND USE 1 TO 2 SPRAYS IN EACH NOSTRIL DAILY AS NEEDED FOR ALLERGIES 16 g 5  . metoprolol succinate (TOPROL-XL) 25 MG 24 hr tablet TAKE 1/2 TABLET(12.5 MG) BY MOUTH DAILY 46 tablet 1  . Multiple Vitamins-Minerals (CENTRUM SILVER PO) Take 1 tablet by mouth daily.    . tamsulosin (FLOMAX) 0.4 MG CAPS capsule Take 0.4 mg by mouth daily after supper.   3  . zolpidem (AMBIEN) 5 MG tablet Take 1 tablet (5 mg total) at bedtime as needed by mouth. for sleep 30 tablet 5  . atorvastatin (LIPITOR) 20 MG tablet Take 1 tablet (20 mg total) by mouth daily. 90 tablet 3  . predniSONE (DELTASONE) 20 MG tablet Take 1 tablet by mouth daily for 5 days, then 1/2 tablet daily for 2 days 6 tablet 0   No current facility-administered medications for this visit.     Allergies-reviewed and updated Allergies  Allergen Reactions  . Lipitor [Atorvastatin] Other (See  Comments)    REACTION: nausea and blurred vision  . Trazodone And Nefazodone Other (See Comments)    dizzy  . Ciprofloxacin Swelling  . Mycophenolate Mofetil Other (See Comments)    REACTION: unspecified  . Amoxicillin Rash    Has patient had a PCN reaction causing immediate rash, facial/tongue/throat swelling, SOB or lightheadedness with hypotension: Unknown Has patient had a PCN reaction causing severe rash involving mucus membranes or skin necrosis: Unknown Has patient had a PCN reaction that required hospitalization: Unknown Has patient had a PCN reaction occurring within the last 10 years: Unknown If all of the above answers are "NO", then may proceed with Cephalosporin use.   [name] Kitchen Penicillins Rash    Has patient had a PCN reaction causing immediate rash, facial/tongue/throat swelling,  SOB or lightheadedness with hypotension: Unknown Has patient had a PCN reaction causing severe rash involving mucus membranes or skin necrosis: Unknown Has patient had a PCN reaction that required hospitalization: Unknown Has patient had a PCN reaction occurring within the last 10 years: Unknown If all of the above answers are "NO", then may proceed with Cephalosporin use.   . Rosuvastatin Other (See Comments)    Unknown     Social History   Social History Narrative   Married 35 years (2nd marriage). 2 kids from previous marriage. 2 stepchildren. 8 grandkids.       Retired from [employer]: walking/exercise, building in shop-furniture (cut finger off in February doing this)   6 grand children - Has bought them all a new car    Brother died of probably addiction   Sister also died; not sure of cause    He enjoys his life; Likes to play the keyboard; guitar    Had played in a gospel quartet     Objective: BP 122/76   Pulse 65   Temp (!) 97.5 F (36.4 C) (Oral)   Ht 6\' 1"  (1.854 m)   Wt 193 lb 3.2 oz (87.6 kg)   SpO2 97%   BMI 25.49 kg/m  Gen: NAD, resting  comfortably HEENT: Mucous membranes are moist. Oropharynx normal Neck: no thyromegaly CV: RRR no murmurs rubs or gallops Reproducible right chest wall pain Lungs: CTAB no crackles, wheeze, rhonchi Abdomen: soft/nontender/nondistended/normal bowel sounds. No rebound or guarding.  Ext: no edema Skin: warm, dry Neuro: grossly normal, moves all extremities, PERRLA  EKG: 1st degree block with rate 63, left axis, otherwise normal intervals, no hypertrophy. Right bundle branch block. EKG compared to 01/11/18 and unchanged from previous.   Assessment/Plan:  82 y.o. male presenting for annual physical.  Health Maintenance counseling: 1. Anticipatory guidance: Patient counseled regarding regular dental exams -q6 months, eye exams - at least yearly but usually more regularly, wearing seatbelts.  2. Risk factor reduction:  Advised patient of need for regular exercise and diet rich and fruits and vegetables to reduce risk of heart attack and stroke. Exercise- staying active- no regular planned exercise. Diet-reasonable diet - balanced and fair amount of plant based portoin.  Wt Readings from Last 3 Encounters:  03/08/18 193 lb 3.2 oz (87.6 kg)  03/06/18 195 lb (88.5 kg)  03/04/18 192 lb (87.1 kg)  3. Immunizations/screenings/ancillary studies- discussed Tdap and shingrix at pharmacy Immunization History  Administered Date(s) Administered  . Influenza Split 08/11/2011, 08/05/2012  . Influenza Whole 08/31/2007, 08/15/2008, 08/20/2009, 08/05/2010  . Influenza, High Dose Seasonal PF 07/28/2016, 09/10/2017  . Influenza,inj,Quad PF,6+ Mos 07/15/2013, 07/20/2014, 08/10/2015  . Pneumococcal Conjugate-13 11/04/2013  . Pneumococcal Polysaccharide-23 02/05/2015  . Td 03/18/2007  . Zoster 04/16/2011  4. Prostate cancer screening-  BPH- on flomax with history of retention issues. Following with urology who wants him to stay on flomax. PSA in February under 2.5- following up at least yearly due to history  retention. UAs with urology  5. Colon cancer screening - 2011- was updated and no polyps. Notes say 10 years but he would be above age 33 so likely done.  6. Skin cancer screening- Dr. Jarome Matin but last seen years ago- advised regular sunscreen use. Denies worrisome, changing, or new skin lesions.   Status of chronic or acute concerns   History left detached retina- vision now stable but still not perfect  CAD (coronary artery disease) of  artery bypass graft Chest pain in patient with CAD history and CABG 2015 (anticipated next functional test 2020 as long as no issues per Dr. [name])  3 weeks ago was out building a few porch swings. Noted a few days later - some soreness in the right chest going around to the right back. Getting better. No exertional chest pain. Worse with pushing on the area.  About 5/10. No exertional pain. Not relieved by rest.  Strongly suspect costochondritis. No signs of cardiac involvement. EKG reassuring. Will trial prednisone low dose 7 day course. Want to avoid nsaids with CAD history.   AAA (abdominal aortic aneurysm) ([facility]) AAA- stable at 3.3 cm 05/11/17 and advised 3 year follow up ultrasound. Will plan on this 2021  B12 deficiency History of low b12- received injections at least through last year- not sure why these were stopped. We will check b12 levels. May need to restart vs. Oral b12   Essential hypertension Hypertension- on metoprolol 12.5mg  daily, doing well on repeat check today. Was having some anxiety around ekg and seemed to improve after reassured on test  Hyperlipidemia HLD- on atorvastatin 20mg - update lipids. LDL goal under 70  Insomnia Insomnia -  on ambien 5mg - sleeps 2-3 hours- failed benadryl or melatonin. No falls or driving issues. He doesn't feel tired the next day  Hyperthyroidism Hyperthyroidism- following with Dr. Loanne Drilling. Seems to be improving without intervention right now- he has follow up in 1 month   Future Appointments    Date Time Provider [place]  04/09/2018  9:15 AM Renato Shin, MD [facility] None   Lab/Order associations: Preventative health care - Plan: CBC, Comprehensive metabolic panel, Lipid panel, Vitamin B12  Chest pain, unspecified type - Plan: EKG 12-Lead  Abdominal aortic aneurysm (AAA) without rupture (HCC)  Hyperlipidemia, unspecified hyperlipidemia type - Plan: CBC, Comprehensive metabolic panel, Lipid panel  B12 deficiency - Plan: Vitamin B12  Meds ordered this encounter  Medications  . predniSONE (DELTASONE) 20 MG tablet    Sig: Take 1 tablet by mouth daily for 5 days, then 1/2 tablet daily for 2 days    Dispense:  6 tablet    Refill:  0   Return precautions advised.  [name], MD

## 2018-03-08 NOTE — Assessment & Plan Note (Signed)
HLD- on atorvastatin 20mg - update lipids. LDL goal under 70

## 2018-03-08 NOTE — Assessment & Plan Note (Signed)
Insomnia -  on ambien 5mg - sleeps 2-3 hours- failed benadryl or melatonin. No falls or driving issues. He doesn't feel tired the next day

## 2018-03-08 NOTE — Assessment & Plan Note (Signed)
Hyperthyroidism- following with Dr. Loanne Drilling. Seems to be improving without intervention right now- he has follow up in 1 month

## 2018-03-08 NOTE — Addendum Note (Signed)
Addended by: Everrett Coombe on: 03/08/2018 11:28 AM   Modules accepted: Orders

## 2018-03-08 NOTE — Assessment & Plan Note (Signed)
AAA- stable at 3.3 cm 05/11/17 and advised 3 year follow up ultrasound. Will plan on this 2021

## 2018-03-09 ENCOUNTER — Other Ambulatory Visit (INDEPENDENT_AMBULATORY_CARE_PROVIDER_SITE_OTHER): Payer: Medicare Other

## 2018-03-09 DIAGNOSIS — Z0000 Encounter for general adult medical examination without abnormal findings: Secondary | ICD-10-CM | POA: Diagnosis not present

## 2018-03-09 LAB — COMPREHENSIVE METABOLIC PANEL
ALT: 13 U/L (ref 0–53)
AST: 17 U/L (ref 0–37)
Albumin: 3.8 g/dL (ref 3.5–5.2)
Alkaline Phosphatase: 78 U/L (ref 39–117)
BUN: 21 mg/dL (ref 6–23)
CO2: 29 mEq/L (ref 19–32)
Calcium: 9.3 mg/dL (ref 8.4–10.5)
Chloride: 107 mEq/L (ref 96–112)
Creatinine, Ser: 0.93 mg/dL (ref 0.40–1.50)
GFR: 83.16 mL/min (ref >60.00)
Glucose, Bld: 92 mg/dL (ref 70–99)
Potassium: 4.7 mEq/L (ref 3.5–5.1)
Sodium: 142 mEq/L (ref 135–145)
Total Bilirubin: 0.4 mg/dL (ref 0.2–1.2)
Total Protein: 7.1 g/dL (ref 6.0–8.3)

## 2018-03-09 LAB — LIPID PANEL
Cholesterol: 123 mg/dL (ref 0–200)
HDL: 51.1 mg/dL (ref >39.00)
LDL Cholesterol: 60 mg/dL (ref 0–99)
NonHDL: 71.46
Total CHOL/HDL Ratio: 2
Triglycerides: 55 mg/dL (ref 0.0–149.0)
VLDL: 11 mg/dL (ref 0.0–40.0)

## 2018-03-09 LAB — VITAMIN B12: Vitamin B-12: 129 pg/mL — ABNORMAL LOW (ref 211–911)

## 2018-03-09 LAB — CBC
HCT: 39.6 % (ref 39.0–52.0)
Hemoglobin: 12.8 g/dL — ABNORMAL LOW (ref 13.0–17.0)
MCHC: 32.4 g/dL (ref 30.0–36.0)
MCV: 79.7 fl (ref 78.0–100.0)
Platelets: 161 10*3/uL (ref 150.0–400.0)
RBC: 4.97 Mil/uL (ref 4.22–5.81)
RDW: 16.6 % — ABNORMAL HIGH (ref 11.5–15.5)
WBC: 6.5 10*3/uL (ref 4.0–10.5)

## 2018-03-09 NOTE — Progress Notes (Signed)
Vitamin B12 levels are low.  We need to go back to weekly injections for a month and then go back to once a month injections.  Team please help set him up for this Your CBC was largely normal (blood counts, infection fighting cells, platelets).  Very mild anemia which could be caused by low B12  Your CMET was normal (kidney, liver, and electrolytes, blood sugar). Your cholesterol looks great

## 2018-03-15 ENCOUNTER — Ambulatory Visit (INDEPENDENT_AMBULATORY_CARE_PROVIDER_SITE_OTHER): Payer: Medicare Other

## 2018-03-15 DIAGNOSIS — E538 Deficiency of other specified B group vitamins: Secondary | ICD-10-CM

## 2018-03-15 MED ORDER — CYANOCOBALAMIN 1000 MCG/ML IJ SOLN
1000.0000 ug | Freq: Once | INTRAMUSCULAR | Status: AC
  Administered 2018-03-15: 1000 ug via INTRAMUSCULAR

## 2018-03-15 NOTE — Progress Notes (Signed)
Patient received vitamin B12 1000 mcg in right deltoid.  Tolerated without difficulty.  Scheduled for another injection in 1 week.

## 2018-03-22 ENCOUNTER — Ambulatory Visit (INDEPENDENT_AMBULATORY_CARE_PROVIDER_SITE_OTHER): Payer: Medicare Other | Admitting: *Deleted

## 2018-03-22 ENCOUNTER — Telehealth: Payer: Self-pay | Admitting: *Deleted

## 2018-03-22 DIAGNOSIS — E538 Deficiency of other specified B group vitamins: Secondary | ICD-10-CM | POA: Diagnosis not present

## 2018-03-22 MED ORDER — CYANOCOBALAMIN 1000 MCG/ML IJ SOLN
1000.0000 ug | Freq: Once | INTRAMUSCULAR | Status: AC
  Administered 2018-03-22: 1000 ug via INTRAMUSCULAR

## 2018-03-22 NOTE — Progress Notes (Signed)
Patient arrived for 2nd weekly injection of B12.Tolerated well. Scheduled patient for next B12 injection.

## 2018-03-22 NOTE — Telephone Encounter (Signed)
I would wait and discuss with Dr. Wendy Poet since he sees him next week.  Usually taking Flomax makes you pee less at night.

## 2018-03-22 NOTE — Telephone Encounter (Signed)
Patient arrived for his nurse visit. He states that he missed two doses of his Tamsulosin and he noticed a big difference in the number of times he had to get up during the night to use the restroom. He would like to know if Dr Yong Channel would be ok with him stopping his Tamsulosin. He wasn't sure if he needed to follow up with Dr Matilde Sprang [facility]) to obtain this answer. He states he saw him recently and everything checked out ok and he follows up next week. Please advise.

## 2018-03-23 NOTE — Telephone Encounter (Signed)
Called patient and he said he would schedule a appointment with his urologist.

## 2018-03-23 NOTE — Telephone Encounter (Signed)
Noted  

## 2018-03-29 ENCOUNTER — Ambulatory Visit (INDEPENDENT_AMBULATORY_CARE_PROVIDER_SITE_OTHER): Payer: Medicare Other

## 2018-03-29 DIAGNOSIS — L723 Sebaceous cyst: Secondary | ICD-10-CM | POA: Diagnosis not present

## 2018-03-29 DIAGNOSIS — L98499 Non-pressure chronic ulcer of skin of other sites with unspecified severity: Secondary | ICD-10-CM | POA: Diagnosis not present

## 2018-03-29 DIAGNOSIS — E538 Deficiency of other specified B group vitamins: Secondary | ICD-10-CM | POA: Diagnosis not present

## 2018-03-29 DIAGNOSIS — D485 Neoplasm of uncertain behavior of skin: Secondary | ICD-10-CM | POA: Diagnosis not present

## 2018-03-29 DIAGNOSIS — L821 Other seborrheic keratosis: Secondary | ICD-10-CM | POA: Diagnosis not present

## 2018-03-29 DIAGNOSIS — Z85828 Personal history of other malignant neoplasm of skin: Secondary | ICD-10-CM | POA: Diagnosis not present

## 2018-03-29 MED ORDER — CYANOCOBALAMIN 1000 MCG/ML IJ SOLN
1000.0000 ug | Freq: Once | INTRAMUSCULAR | Status: AC
  Administered 2018-03-29: 1000 ug via INTRAMUSCULAR

## 2018-03-29 NOTE — Progress Notes (Signed)
Patient received B12 1000 mcg IM in right deltoid.  Tolerated without difficulty.  Will schedule B12 injection in 1 week.

## 2018-03-31 DIAGNOSIS — H3522 Other non-diabetic proliferative retinopathy, left eye: Secondary | ICD-10-CM | POA: Diagnosis not present

## 2018-03-31 DIAGNOSIS — H33052 Total retinal detachment, left eye: Secondary | ICD-10-CM | POA: Diagnosis not present

## 2018-04-05 ENCOUNTER — Ambulatory Visit (INDEPENDENT_AMBULATORY_CARE_PROVIDER_SITE_OTHER): Payer: Medicare Other

## 2018-04-05 DIAGNOSIS — E538 Deficiency of other specified B group vitamins: Secondary | ICD-10-CM

## 2018-04-05 MED ORDER — CYANOCOBALAMIN 1000 MCG/ML IJ SOLN
1000.0000 ug | Freq: Once | INTRAMUSCULAR | Status: AC
  Administered 2018-04-05: 1000 ug via INTRAMUSCULAR

## 2018-04-05 NOTE — Progress Notes (Signed)
Patient received vitamin B12 1000 mcg IM in left deltoid.  Tolerated without difficulty.  Will schedule appointment for another B12 injection in 1 month.

## 2018-04-09 ENCOUNTER — Ambulatory Visit: Payer: Medicare Other | Admitting: Endocrinology

## 2018-04-10 ENCOUNTER — Other Ambulatory Visit: Payer: Self-pay | Admitting: Family Medicine

## 2018-04-13 ENCOUNTER — Ambulatory Visit: Payer: Self-pay | Admitting: *Deleted

## 2018-04-13 ENCOUNTER — Ambulatory Visit: Payer: Medicare Other | Admitting: Family Medicine

## 2018-04-13 ENCOUNTER — Encounter: Payer: Self-pay | Admitting: Family Medicine

## 2018-04-13 ENCOUNTER — Ambulatory Visit: Payer: Medicare Other | Admitting: Endocrinology

## 2018-04-13 VITALS — BP 128/78 | HR 86 | Temp 97.7°F | Ht 73.0 in | Wt 190.8 lb

## 2018-04-13 DIAGNOSIS — E538 Deficiency of other specified B group vitamins: Secondary | ICD-10-CM

## 2018-04-13 DIAGNOSIS — R351 Nocturia: Secondary | ICD-10-CM | POA: Diagnosis not present

## 2018-04-13 DIAGNOSIS — N401 Enlarged prostate with lower urinary tract symptoms: Secondary | ICD-10-CM | POA: Diagnosis not present

## 2018-04-13 DIAGNOSIS — I10 Essential (primary) hypertension: Secondary | ICD-10-CM | POA: Diagnosis not present

## 2018-04-13 DIAGNOSIS — Z0289 Encounter for other administrative examinations: Secondary | ICD-10-CM

## 2018-04-13 LAB — POC URINALSYSI DIPSTICK (AUTOMATED)
Bilirubin, UA: NEGATIVE
Blood, UA: NEGATIVE
Glucose, UA: NEGATIVE
Ketones, UA: NEGATIVE
Leukocytes, UA: NEGATIVE
Nitrite, UA: NEGATIVE
Protein, UA: POSITIVE — AB
Spec Grav, UA: >=1.03 — AB (ref 1.010–1.025)
Urobilinogen, UA: 1 E.U./dL
pH, UA: 5.5 (ref 5.0–8.0)

## 2018-04-13 LAB — VITAMIN B12: Vitamin B-12: 427 pg/mL (ref 211–911)

## 2018-04-13 NOTE — Telephone Encounter (Signed)
Pt called with complaints of low BP and dizziness which started a week ago; he states that he "didnt' write down" previous readings; his last reading 111/92 HR 105 taken on his left arm; he also states that he stopped taking his metoprolol 3 days ago; the pt reports that he has not had dizziness this morning; the pt also states that he had to hold onto something because he was so dizzy; recommendations made per nurse triage protocol to include seeing a physician within 24 hours; pt offered and accepted appointment with Dr [name], [facility] 04/13/18 at 0930; he verbalizes understanding; will route to office for notification of this upcoming appointment.    Reason for Disposition . [3] Systolic BP 66-440 AND [3] taking blood pressure medications AND [3] NOT dizzy, lightheaded or weak  Answer Assessment - Initial Assessment Questions 1. BLOOD PRESSURE: "What is the blood pressure?" "Did you take at least two measurements 5 minutes apart?"     124/77 HR 0600 111/92 0630 2. ONSET: "When did you take your blood pressure?"     0600 and 0630 3. HOW: "How did you obtain the blood pressure?" (e.g., visiting nurse, automatic home BP monitor)     Home cuff 4. HISTORY: "Do you have a history of low blood pressure?" "What is your blood pressure normally?"     110s/90s 5. MEDICATIONS: "Are you taking any medications for blood pressure?" If yes: "Have they been changed recently?"     yes 6. PULSE RATE: "Do you know what your pulse rate is?"      105 7. OTHER SYMPTOMS: "Have you been sick recently?" "Have you had a recent injury?"     no 8. PREGNANCY: "Is there any chance you are pregnant?" "When was your last menstrual period?"     n/a  Protocols used: LOW BLOOD PRESSURE-A-AH

## 2018-04-13 NOTE — Progress Notes (Signed)
Your b12 was normal- ok to move to monthly shots- make sure not to miss any though Urine shows some mild dehydration- pick up your water intake. Slight amount of protein in urine and we can recheck this another time when you are hydrated- but no signs of infection

## 2018-04-13 NOTE — Assessment & Plan Note (Signed)
S: controlled mild poorly on first check at 140/72, looks great on repeat   He stopped his metoprolol half tablet a few days ago. Some time ago also on telmisartan but had fatigue and dizziness in AM with this and had to stop.  BP Readings from Last 3 Encounters:  04/13/18 128/78  03/08/18 122/76  03/06/18 (!) 142/84  A/P: We discussed blood pressure goal of <140/90. He is at goal on no medications so he will remain off metoprolol unless BP trends up

## 2018-04-13 NOTE — Progress Notes (Signed)
Subjective:  Timothy Gallagher is a 82 y.o. year old very pleasant male patient who presents for/with See problem oriented charting ROS- had some lightheadedness- now improed. Has nocturia. No chest pain or shortness of breath reported.    Past Medical History-  Patient Active Problem List   Diagnosis Date Noted  . Hyperthyroidism 01/13/2018    Priority: High  . Detached retina, left 06/30/2017    Priority: High  . AAA (abdominal aortic aneurysm) ([place]) 05/12/2017    Priority: High  . CAD (coronary artery disease) of artery bypass graft 05/19/2014    Priority: High  . BPPV (benign paroxysmal positional vertigo) 09/01/2016    Priority: Medium  . Insomnia 11/22/2014    Priority: Medium  . Anemia 07/20/2014    Priority: Medium  . BPH (benign prostatic hyperplasia) 12/05/2013    Priority: Medium  . Hyperlipidemia 03/13/2008    Priority: Medium  . B12 deficiency 06/07/2007    Priority: Medium  . Essential hypertension 04/09/2007    Priority: Medium  . Left sided abdominal pain 04/15/2017    Priority: Low  . Low back pain 02/06/2017    Priority: Low  . Anal fissure 04/07/2016    Priority: Low  . Internal and external bleeding hemorrhoids 01/22/2015    Priority: Low  . Palpitations 02/11/2013    Priority: Low  . OSTEOARTHRITIS, WRIST, RIGHT 08/05/2010    Priority: Low  . ACNE ROSACEA 06/27/2009    Priority: Low  . ESOPHAGEAL STRICTURE 04/27/2009    Priority: Low  . ACTINIC KERATOSIS, HEAD 04/18/2009    Priority: Low  . NECK PAIN 09/14/2008    Priority: Low  . NEUROPATHY, IDIOPATHIC PERIPHERAL NEC 08/11/2007    Priority: Low  . Irritable bowel syndrome 08/11/2007    Priority: Low  . ALLERGIC RHINITIS 04/09/2007    Priority: Low  . Chest pain 03/06/2018    Medications- reviewed and updated Current Outpatient Medications  Medication Sig Dispense Refill  . acetaminophen (TYLENOL) 500 MG tablet Take 500 mg by mouth daily as needed (for pain). Reported on 04/11/2016    .  aspirin 81 MG EC tablet Take 1 tablet (81 mg total) by mouth daily. (Patient taking differently: Take 81 mg by mouth every evening. )    . fluticasone (FLONASE) 50 MCG/ACT nasal spray SHAKE LIQUID AND USE 1 TO 2 SPRAYS IN EACH NOSTRIL DAILY AS NEEDED FOR ALLERGIES 16 g 5  . Multiple Vitamins-Minerals (CENTRUM SILVER PO) Take 1 tablet by mouth daily.    . predniSONE (DELTASONE) 20 MG tablet Take 1 tablet by mouth daily for 5 days, then 1/2 tablet daily for 2 days 6 tablet 0  . tamsulosin (FLOMAX) 0.4 MG CAPS capsule Take 0.4 mg by mouth daily after supper.   3  . zolpidem (AMBIEN) 5 MG tablet Take 1 tablet (5 mg total) at bedtime as needed by mouth. for sleep 30 tablet 5  . atorvastatin (LIPITOR) 20 MG tablet Take 1 tablet (20 mg total) by mouth daily. 90 tablet 3   No current facility-administered medications for this visit.     Objective: BP 128/78   Pulse 86   Temp 97.7 F (36.5 C) (Oral)   Ht 6\' 1"  (1.854 m)   Wt 190 lb 12.8 oz (86.5 kg)   SpO2 98%   BMI 25.17 kg/m  Gen: NAD, resting comfortably CV: RRR no murmurs rubs or gallops Lungs: CTAB no crackles, wheeze, rhonchi Abdomen: soft/nontender/nondistended/normal bowel sounds. No rebound or guarding.  No suprapubic tenderness  Ext: no edema Skin: warm, dry Neuro: normal speech  Results for orders placed or performed in visit on 04/13/18 (from the past 24 hour(s))  Vitamin B12     Status: None   Collection Time: 04/13/18 10:06 AM  Result Value Ref Range   Vitamin B-12 427 211 - 911 pg/mL  POCT Urinalysis Dipstick (Automated)     Status: Abnormal   Collection Time: 04/13/18 10:25 AM  Result Value Ref Range   Color, UA Yellow    Clarity, UA Clear    Glucose, UA Negative Negative   Bilirubin, UA Negative    Ketones, UA Negative    Spec Grav, UA >=1.030 (A) 1.010 - 1.025   Blood, UA Negative    pH, UA 5.5 5.0 - 8.0   Protein, UA Positive (A) Negative   Urobilinogen, UA 1.0 0.2 or 1.0 E.U./dL   Nitrite, UA Negative     Leukocytes, UA Negative Negative   Assessment/Plan:  Essential hypertension S: controlled mild poorly on first check at 140/72, looks great on repeat   He stopped his metoprolol half tablet a few days ago. Some time ago also on telmisartan but had fatigue and dizziness in AM with this and had to stop.  BP Readings from Last 3 Encounters:  04/13/18 128/78  03/08/18 122/76  03/06/18 (!) 142/84  A/P: We discussed blood pressure goal of <140/90. He is at goal on no medications so he will remain off metoprolol unless BP trends up  B12 deficiency S:  has been on weekly injections to get b12 back up as of last month- has had 4 shots and ready to go to once a month if levels ok Lab Results  Component Value Date   VITAMINB12 427 04/13/2018  A/P: updated b12 today and normal - back to monthly injections since adequately repeated. We stressed importance of regularly doing these to keep level up  BPH (benign prostatic hyperplasia) S: peeing 3-4x a night- no recent change. Darker color and some smell to it. Drinking more water to try to stay better hydrated. He remains on flomax A/P: UA largely reassuring other than some protein and dehydration. we discussed cutting off liquids at 7 or 8 at night but still drinking adequately in the day.   Since lightheadedness resolved off metoprolol- we can leave flomax for now. Patient had UTI off flomax in the past likely related to some level of retention   Future Appointments  Date Time Provider [place]  05/14/2018  9:00 AM [facility] NURSE [facility]  09/27/2018  8:45 AM [name], MD [facility]   Lab/Order associations: B12 deficiency - Plan: Vitamin B12  Nocturia - Plan: POCT Urinalysis Dipstick (Automated)  Return precautions advised.  [name], MD

## 2018-04-13 NOTE — Telephone Encounter (Signed)
Note from our 9:30 patient

## 2018-04-13 NOTE — Assessment & Plan Note (Signed)
S:  has been on weekly injections to get b12 back up as of last month- has had 4 shots and ready to go to once a month if levels ok Lab Results  Component Value Date   VITAMINB12 427 04/13/2018  A/P: updated b12 today and normal - back to monthly injections since adequately repeated. We stressed importance of regularly doing these to keep level up

## 2018-04-13 NOTE — Patient Instructions (Addendum)
Health Maintenance Due  Topic Date Due  . TETANUS/TDAP - Please have it done at your pharmacy, for its not covered in the office. 03/17/2017   Blood pressure looks fine off the metoprolol and glad the dizziness is gone.   Continue off the metorpolol for now. If you notice blood pressure more than 2-3 days in a row over 140/90 please give me a call and we will get a plan together.   Schedule a nurse visit for b12 shot before you leave  Please stop by lab before you go

## 2018-04-13 NOTE — Assessment & Plan Note (Signed)
S: peeing 3-4x a night- no recent change. Darker color and some smell to it. Drinking more water to try to stay better hydrated. He remains on flomax A/P: UA largely reassuring other than some protein and dehydration. we discussed cutting off liquids at 7 or 8 at night but still drinking adequately in the day.   Since lightheadedness resolved off metoprolol- we can leave flomax for now. Patient had UTI off flomax in the past likely related to some level of retention

## 2018-04-14 ENCOUNTER — Other Ambulatory Visit: Payer: Self-pay | Admitting: Family Medicine

## 2018-04-20 DIAGNOSIS — M4187 Other forms of scoliosis, lumbosacral region: Secondary | ICD-10-CM | POA: Diagnosis not present

## 2018-04-20 DIAGNOSIS — M47816 Spondylosis without myelopathy or radiculopathy, lumbar region: Secondary | ICD-10-CM | POA: Diagnosis not present

## 2018-04-27 DIAGNOSIS — N400 Enlarged prostate without lower urinary tract symptoms: Secondary | ICD-10-CM | POA: Diagnosis not present

## 2018-04-27 DIAGNOSIS — R350 Frequency of micturition: Secondary | ICD-10-CM | POA: Diagnosis not present

## 2018-04-27 DIAGNOSIS — R351 Nocturia: Secondary | ICD-10-CM | POA: Diagnosis not present

## 2018-04-30 ENCOUNTER — Encounter: Payer: Self-pay | Admitting: Family Medicine

## 2018-05-12 ENCOUNTER — Encounter: Payer: Self-pay | Admitting: Family Medicine

## 2018-05-12 ENCOUNTER — Ambulatory Visit: Payer: Medicare Other | Admitting: Family Medicine

## 2018-05-12 VITALS — BP 138/78 | HR 76 | Temp 97.9°F | Ht 73.0 in | Wt 192.0 lb

## 2018-05-12 DIAGNOSIS — I10 Essential (primary) hypertension: Secondary | ICD-10-CM

## 2018-05-12 DIAGNOSIS — E538 Deficiency of other specified B group vitamins: Secondary | ICD-10-CM

## 2018-05-12 DIAGNOSIS — K1120 Sialoadenitis, unspecified: Secondary | ICD-10-CM | POA: Diagnosis not present

## 2018-05-12 MED ORDER — CYANOCOBALAMIN 1000 MCG/ML IJ SOLN
1000.0000 ug | Freq: Once | INTRAMUSCULAR | Status: AC
  Administered 2018-05-12: 1000 ug via INTRAMUSCULAR

## 2018-05-12 MED ORDER — CLINDAMYCIN HCL 150 MG PO CAPS: 450.0000 mg | ORAL_CAPSULE | Freq: Three times a day (TID) | ORAL | 0 refills | Status: DC

## 2018-05-12 NOTE — Progress Notes (Signed)
Subjective:  Timothy MOREFIELD is a 82 y.o. year old very pleasant male patient who presents for/with See problem oriented charting ROS- no fever or chills. Does have jaw pain and some fatigue. No chest pain or shortness of breath   Past Medical History-  Patient Active Problem List   Diagnosis Date Noted  . Hyperthyroidism 01/13/2018    Priority: High  . Detached retina, left 06/30/2017    Priority: High  . AAA (abdominal aortic aneurysm) ([place]) 05/12/2017    Priority: High  . CAD (coronary artery disease) of artery bypass graft 05/19/2014    Priority: High  . BPPV (benign paroxysmal positional vertigo) 09/01/2016    Priority: Medium  . Insomnia 11/22/2014    Priority: Medium  . Anemia 07/20/2014    Priority: Medium  . BPH (benign prostatic hyperplasia) 12/05/2013    Priority: Medium  . Hyperlipidemia 03/13/2008    Priority: Medium  . B12 deficiency 06/07/2007    Priority: Medium  . Essential hypertension 04/09/2007    Priority: Medium  . Left sided abdominal pain 04/15/2017    Priority: Low  . Low back pain 02/06/2017    Priority: Low  . Anal fissure 04/07/2016    Priority: Low  . Internal and external bleeding hemorrhoids 01/22/2015    Priority: Low  . Palpitations 02/11/2013    Priority: Low  . OSTEOARTHRITIS, WRIST, RIGHT 08/05/2010    Priority: Low  . ACNE ROSACEA 06/27/2009    Priority: Low  . ESOPHAGEAL STRICTURE 04/27/2009    Priority: Low  . ACTINIC KERATOSIS, HEAD 04/18/2009    Priority: Low  . NECK PAIN 09/14/2008    Priority: Low  . NEUROPATHY, IDIOPATHIC PERIPHERAL NEC 08/11/2007    Priority: Low  . Irritable bowel syndrome 08/11/2007    Priority: Low  . ALLERGIC RHINITIS 04/09/2007    Priority: Low  . Chest pain 03/06/2018    Medications- reviewed and updated Current Outpatient Medications  Medication Sig Dispense Refill  . acetaminophen (TYLENOL) 500 MG tablet Take 500 mg by mouth daily as needed (for pain). Reported on 04/11/2016    . aspirin  81 MG EC tablet Take 1 tablet (81 mg total) by mouth daily. (Patient taking differently: Take 81 mg by mouth every evening. )    . atorvastatin (LIPITOR) 20 MG tablet Take 1 tablet (20 mg total) by mouth daily. 90 tablet 3  . clindamycin (CLEOCIN) 150 MG capsule Take 3 capsules (450 mg total) by mouth 3 (three) times daily. 90 capsule 0  . fluticasone (FLONASE) 50 MCG/ACT nasal spray SHAKE LIQUID AND USE 1 TO 2 SPRAYS IN EACH NOSTRIL DAILY AS NEEDED FOR ALLERGIES 16 g 5  . Multiple Vitamins-Minerals (CENTRUM SILVER PO) Take 1 tablet by mouth daily.    . predniSONE (DELTASONE) 20 MG tablet Take 1 tablet by mouth daily for 5 days, then 1/2 tablet daily for 2 days 6 tablet 0  . tamsulosin (FLOMAX) 0.4 MG CAPS capsule Take 0.4 mg by mouth daily after supper.   3  . zolpidem (AMBIEN) 5 MG tablet TAKE 1 TABLET BY MOUTH AT BEDTIME AS NEEDED FOR SLEEP 30 tablet 5   No current facility-administered medications for this visit.     Objective: BP 138/78   Pulse 76   Temp 97.9 F (36.6 C) (Oral)   Ht 6\' 1"  (1.854 m)   Wt 192 lb (87.1 kg)   SpO2 97%   BMI 25.33 kg/m  Gen: NAD, resting comfortably Some rhinorrhea in both nares, some  drainage in pharynx. No purulent material in oral cavity. Patient is very tender to palpation just below his ear to up over jaw and down underneath left jaw. Slight click with opening and closing jaw fully.  CV: RRR no murmurs rubs or gallops Lungs: CTAB no crackles, wheeze, rhonchi Abdomen: soft/nontender/nondistended/normal bowel sounds.  Ext: no edema Skin: warm, dry   Assessment/Plan:  Parotitis S:  had been having low back pain in the past and saw Dr. Patrice Paradise- and cleared up on its own. 2 weeks ago started with pain in left jaw area. He feels some knots underneath chin. Hurts in particular with chewing- has to take small bites of food. Pain level right now 5/10, up to 10 with palpation. Never had anything like this last before- but has had a day or two in past with  similar pain. Pain has eased up some. Was having a hard time sleeping at first.   Pain doesn't worsen with walking or exerting himself. No left arm pain. No chest pain or shortness of breath. Occasional mild dizziness- ok if stands slowly and is on flomax  No cough, congestion, runny nose.  A/P: Parotitis per history and exam From avs: "I think you have an infected parotid gland. Use clindamycin 3x a day with 3 pills each time. This should help heal it up- if you are not continuing to improve by Friday see me or one of my colleagues. If symptoms worsen see [place] sooner. Also happy to see you next week if not continuing to improve. Definitely if not resolved by end of antibiotics see [place] back"  Essential hypertension S: controlled on no rx though is high normal BP Readings from Last 3 Encounters:  05/12/18 138/78  04/13/18 128/78  03/08/18 122/76  A/P: We discussed blood pressure goal of <140/90. Continue without meds for now but BP trending up and may have to restart- hopeful dont have to as he gets easily lightheaded on meds    Future Appointments  Date Time Provider [place]  05/17/2018  7:30 AM Renato Shin, MD [facility] None  06/15/2018  9:00 AM [facility] NURSE [facility]  09/27/2018  8:45 AM [name], MD [facility]   Lab/Order associations: Parotitis  Essential hypertension  Vitamin B12 deficiency - Plan: cyanocobalamin ((VITAMIN B-12)) injection 1,000 mcg  Meds ordered this encounter  Medications  . cyanocobalamin ((VITAMIN B-12)) injection 1,000 mcg  . clindamycin (CLEOCIN) 150 MG capsule    Sig: Take 3 capsules (450 mg total) by mouth 3 (three) times daily.    Dispense:  90 capsule    Refill:  0   Return precautions advised- also gave handout on parotitis [name], MD

## 2018-05-12 NOTE — Patient Instructions (Addendum)
Health Maintenance Due  Topic Date Due  . I would also like for you to sign up for an annual wellness visit with one of our nurses, Cassie or Manuela Schwartz, who both specialize in the annual wellness visit. This is a free benefit under medicare that may help [place] find additional ways to help you. Some highlights are reviewing medications, lifestyle, and doing a dementia screen.   . Please check with your pharmacy to see if they have the shingrix vaccine. If they do- please get this immunization and update [place] by phone call or mychart with dates you receive the vaccine   . TETANUS/TDAP - Informed patient to have it done at his pharmacy, and call our office with the date so we can update it in our system. 03/17/2017   Please call Dr. Cordelia Pen office back for follow up- try not to hit cancel next time they call you!   I think you have an infected parotid gland. Use clindamycin 3x a day with 3 pills each time. This should help heal it up- if you are not continuing to improve by Friday see me or one of my colleagues. If symptoms worsen see [place] sooner. Also happy to see you next week if not continuing to improve. Definitely if not resolved by end of antibiotics see [place] back   Parotitis Parotitis is irritation and swelling (inflammation) of one or both of your parotid glands. These glands produce saliva. They are found on each side of your face, below and in front of your earlobes. The saliva that they produce comes out of tiny openings (ducts) inside your cheeks. Parotitis may cause sudden swelling and pain (acute parotitis). It can also cause repeated episodes of swelling and pain or continued swelling that may or may not be painful (chronic parotitis). What are the causes? Causes of this condition include:  Bacterial infections.  Viral infections, such as mumps and HIV.  Blockage (obstruction) of saliva flow through the parotid glands. This can be from a stone, scar tissue, or tumor.  Diseases that cause your  body's defense system to attack salivary glands and cause inflammation (autoimmune diseases).  What increases the risk? This condition is more likely to develop in:  People who are 1 or older.  People who do not drink enough fluids (dehydration).  People who drink too much alcohol.  People who have a dry mouth.  People who have poor dental hygiene.  People who have diabetes.  People who have gout.  People who have a long-term illness.  People who have had X-ray treatments to the head and neck.  People who take certain medicines.  What are the signs or symptoms? Symptoms of this condition depend on the cause. Symptoms may include:  Swelling under and in front of the ear. This may get worse after eating.  Redness of the skin over the parotid gland.  Pain and tenderness over the parotid gland. This may get worse after eating.  Fever or chills.  Pus coming from the ducts inside the mouth.  Dry mouth.  A bad taste in the mouth.  How is this diagnosed? This condition is diagnosed with a medical history and physical exam. You may also have tests to find the cause of parotitis. These tests may include:  Doing blood tests to check for autoimmune disease or a viral infection.  Taking a sample of fluid from the parotid gland to test for infection.  Injecting the ducts of the gland with a dye before taking  X-rays (sialogram).  Having other imaging studies of the gland, including X-rays, ultrasound, MRI, or CT scan.  Checking the opening of the gland for a stone or obstruction.  Placing a needle into the gland to remove tissue for a biopsy (fine needle aspiration).  How is this treated? Treatment for this condition depends on the cause. Treatment may include:  Antibiotic medicine for a bacterial infection.  Drinking more fluids.  Removing a stone or obstruction.  Treating an underlying disease that is causing parotitis.  Surgery to drain an infection, remove a  growth, or remove the whole gland (parotidectomy).  Treatment may not be needed if parotid swelling goes away with home care. Follow these instructions at home: Medicines  Take over-the-counter and prescription medicines only as told by your health care provider.  If you were prescribed an antibiotic, take it as told by your health care provider. Do not stop taking the antibiotic even if you start to feel better. Managing pain and swelling  Apply warm compresses to the affected area as told by your health care provider.  Gently massage the parotid glands as told by your health care provider. General instructions   Drink enough fluid to keep your urine clear or pale yellow.  Try sucking on sour candy. This may help to make your mouth less dry and may stimulate the flow of saliva.  Keep your mouth clean and moist. Gargle with a salt-water mixture 3-4 times per day, or as needed. To make a salt-water mixture, completely dissolve -1 tsp of salt in 1 cup of warm water.  Maintain good oral health. ? Brush your teeth at least two times per day. ? Floss your teeth every day. ? See your dentist regularly.  Do not use tobacco products, including cigarettes, chewing tobacco, or e-cigarettes. If you need help quitting, ask your health care provider.  Keep all follow-up visits as told by your health care provider. This is important. Contact a health care provider if:  You have a fever or chills.  You have new symptoms.  Your symptoms get worse.  Your symptoms do not improve with treatment. This information is not intended to replace advice given to you by your health care provider. Make sure you discuss any questions you have with your health care provider. Document Released: 04/25/2002 Document Revised: 04/10/2016 Document Reviewed: 03/29/2015 Elsevier Interactive Patient Education  [employer].

## 2018-05-13 NOTE — Assessment & Plan Note (Signed)
S: controlled on no rx though is high normal BP Readings from Last 3 Encounters:  05/12/18 138/78  04/13/18 128/78  03/08/18 122/76  A/P: We discussed blood pressure goal of <140/90. Continue without meds for now but BP trending up and may have to restart- hopeful dont have to as he gets easily lightheaded on meds

## 2018-05-14 ENCOUNTER — Ambulatory Visit: Payer: Medicare Other

## 2018-05-17 ENCOUNTER — Telehealth: Payer: Self-pay | Admitting: Endocrinology

## 2018-05-17 ENCOUNTER — Ambulatory Visit (INDEPENDENT_AMBULATORY_CARE_PROVIDER_SITE_OTHER): Payer: Medicare Other | Admitting: Endocrinology

## 2018-05-17 ENCOUNTER — Encounter: Payer: Self-pay | Admitting: Endocrinology

## 2018-05-17 VITALS — BP 132/72 | HR 85 | Wt 190.0 lb

## 2018-05-17 DIAGNOSIS — E0590 Thyrotoxicosis, unspecified without thyrotoxic crisis or storm: Secondary | ICD-10-CM | POA: Diagnosis not present

## 2018-05-17 LAB — T4, FREE: Free T4: 1.24 ng/dL (ref 0.60–1.60)

## 2018-05-17 LAB — TSH: TSH: <0.01 u[IU]/mL — ABNORMAL LOW (ref 0.35–4.50)

## 2018-05-17 MED ORDER — METHIMAZOLE 10 MG PO TABS: 10.0000 mg | ORAL_TABLET | Freq: Every day | ORAL | 3 refills | Status: DC

## 2018-05-17 NOTE — Patient Instructions (Signed)
blood tests are requested for you today.  We'll let you know about the results.   Please come back for a follow-up appointment in 3 months.   

## 2018-05-17 NOTE — Telephone Encounter (Signed)
I have already spoken to patient regarding this.

## 2018-05-17 NOTE — Telephone Encounter (Signed)
Please call patient at ph# [phone]: lab results. Pharmacy has script but patient thought Dr. Loanne Drilling would call him first.

## 2018-05-17 NOTE — Progress Notes (Signed)
Subjective:    Patient ID: Timothy Gallagher, male    DOB: 1936/05/11, 82 y.o.   MRN: [id number]  HPI Pt returns for f/u of hyperthyroidism (dx'ed early 2019; he has never been on therapy for this, as f/u TFT have improved; he has never had thyroid imaging; he takes toprol for CAD, not thyroid).  pt states he feels well in general.   Past Medical History:  Diagnosis Date  . ACNE ROSACEA 06/27/2009  . ACTINIC KERATOSIS, HEAD 04/18/2009  . Acute maxillary sinusitis 05/14/2010  . ALLERGIC RHINITIS 04/09/2007  . Anal fissure 04/07/2016  . B12 DEFICIENCY 06/07/2007  . BACK PAIN WITH RADICULOPATHY 04/24/2008  . Cancer ([place])    skin  . CHEST WALL PAIN, ACUTE 06/15/2009  . Chronic maxillary sinusitis 05/29/2008  . COLITIS 04/27/2009  . COLONIC POLYPS, HX OF 04/27/2009   tubular adenomas  . Coronary artery disease 05/12/2014   Cath 05/12/2014 w/ severe 3-vessel CAD and preserved LV function, EF 55%  . DERMATITIS, ATOPIC 10/12/2007  . DIVERTICULOSIS, COLON 04/27/2009  . ECCHYMOSES, SPONTANEOUS 06/27/2008  . Elevated sedimentation rate 05/02/2009  . ESOPHAGEAL STRICTURE 04/27/2009  . GASTRITIS, CHRONIC 04/27/2009  . HYPERLIPIDEMIA 03/13/2008  . HYPERTENSION 04/09/2007  . Internal bleeding hemorrhoids 01/22/2015   01/22/2015 Seen at anoscopy, grade 1 all 3 positions   . Irritable bowel syndrome 08/11/2007  . KIDNEY DISEASE 04/09/2007  . NECK PAIN 09/14/2008  . NEUROPATHY, IDIOPATHIC PERIPHERAL NEC 08/11/2007  . OSTEOARTHRITIS, WRIST, RIGHT 08/05/2010  . Postoperative delirium 05/20/2014  . S/P CABG x 4 05/19/2014   LIMA to LAD, SVG to diag, SVG to OM, SVG to PDA, EVH via right thigh and leg    Past Surgical History:  Procedure Laterality Date  . CARDIAC CATHETERIZATION    . COLONOSCOPY    . CORONARY ARTERY BYPASS GRAFT N/A 05/19/2014   Procedure: CORONARY ARTERY BYPASS GRAFTING (CABG);  Surgeon: Rexene Alberts, MD;  Location: [facility];  Service: Open Heart Surgery;  Laterality: N/A;  Times 4 using left internal  mammary artery and endoscopically harvested right saphenous vein  . ESOPHAGOGASTRODUODENOSCOPY    . FINGER SURGERY     cut off end of finger  . FLEXIBLE SIGMOIDOSCOPY    . HEMORRHOID BANDING    . HERNIA REPAIR    . INCISION AND DRAINAGE WOUND WITH FOREIGN BODY REMOVAL Left 12/20/2013   Procedure: INCISION AND DRAINAGE LEFT INDEX FINGER;  Surgeon: Tennis Must, MD;  Location: [facility];  Service: Orthopedics;  Laterality: Left;  . INTRAOPERATIVE TRANSESOPHAGEAL ECHOCARDIOGRAM N/A 05/19/2014   Procedure: INTRAOPERATIVE TRANSESOPHAGEAL ECHOCARDIOGRAM;  Surgeon: Rexene Alberts, MD;  Location: [facility];  Service: Open Heart Surgery;  Laterality: N/A;  . lamenectomy    . LEFT HEART CATHETERIZATION WITH CORONARY ANGIOGRAM N/A 05/12/2014   Procedure: LEFT HEART CATHETERIZATION WITH CORONARY ANGIOGRAM;  Surgeon: Sinclair Grooms, MD;  Location[facility];  Service: Cardiovascular;  Laterality: N/A;  . LUMBAR FUSION    . TONSILLECTOMY    . VARICOSE VEIN SURGERY Left     Social History   Socioeconomic History  . Marital status: Married    Spouse name: Not on file  . Number of children: 4  . Years of education: Not on file  . Highest education level: Not on file  Occupational History  . Occupation: retired    Fish farm manager: RETIRED    Comment: from [employer]  . Financial resource strain: Not on file  . Food insecurity:  Worry: Not on file    Inability: Not on file  . Transportation needs:    Medical: Not on file    Non-medical: Not on file  Tobacco Use  . Smoking status: Former Smoker    Packs/day: 0.50    Years: 42.00    Pack years: 21.00    Types: Cigarettes    Last attempt to quit: 11/17/1998    Years since quitting: 19.5  . Smokeless tobacco: Never Used  Substance and Sexual Activity  . Alcohol use: No  . Drug use: No  . Sexual activity: Yes  Lifestyle  . Physical activity:    Days per week: Not on file    Minutes per session: Not on file  . Stress: Not on file    Relationships  . Social connections:    Talks on phone: Not on file    Gets together: Not on file    Attends religious service: Not on file    Active member of club or organization: Not on file    Attends meetings of clubs or organizations: Not on file    Relationship status: Not on file  . Intimate partner violence:    Fear of current or ex partner: Not on file    Emotionally abused: Not on file    Physically abused: Not on file    Forced sexual activity: Not on file  Other Topics Concern  . Not on file  Social History Narrative   Married 35 years (2nd marriage). 2 kids from previous marriage. 2 stepchildren. 8 grandkids.       Retired from [employer]: walking/exercise, building in shop-furniture (cut finger off in February doing this)   6 grand children - Has bought them all a new car    Brother died of probably addiction   Sister also died; not sure of cause    He enjoys his life; Likes to play the keyboard; guitar    Had played in a gospel quartet     Current Outpatient Medications on File Prior to Visit  Medication Sig Dispense Refill  . acetaminophen (TYLENOL) 500 MG tablet Take 500 mg by mouth daily as needed (for pain). Reported on 04/11/2016    . aspirin 81 MG EC tablet Take 1 tablet (81 mg total) by mouth daily. (Patient taking differently: Take 81 mg by mouth every evening. )    . clindamycin (CLEOCIN) 150 MG capsule Take 3 capsules (450 mg total) by mouth 3 (three) times daily. 90 capsule 0  . fluticasone (FLONASE) 50 MCG/ACT nasal spray SHAKE LIQUID AND USE 1 TO 2 SPRAYS IN EACH NOSTRIL DAILY AS NEEDED FOR ALLERGIES 16 g 5  . Multiple Vitamins-Minerals (CENTRUM SILVER PO) Take 1 tablet by mouth daily.    . tamsulosin (FLOMAX) 0.4 MG CAPS capsule Take 0.4 mg by mouth daily after supper.   3  . zolpidem (AMBIEN) 5 MG tablet TAKE 1 TABLET BY MOUTH AT BEDTIME AS NEEDED FOR SLEEP 30 tablet 5  . atorvastatin (LIPITOR) 20 MG tablet Take 1  tablet (20 mg total) by mouth daily. 90 tablet 3   No current facility-administered medications on file prior to visit.     Allergies  Allergen Reactions  . Lipitor [Atorvastatin] Other (See Comments)    REACTION: nausea and blurred vision  . Trazodone And Nefazodone Other (See Comments)    dizzy  . Ciprofloxacin Swelling  . Mycophenolate Mofetil Other (See Comments)    REACTION:  unspecified  . Amoxicillin Rash    Has patient had a PCN reaction causing immediate rash, facial/tongue/throat swelling, SOB or lightheadedness with hypotension: Unknown Has patient had a PCN reaction causing severe rash involving mucus membranes or skin necrosis: Unknown Has patient had a PCN reaction that required hospitalization: Unknown Has patient had a PCN reaction occurring within the last 10 years: Unknown If all of the above answers are "NO", then may proceed with Cephalosporin use.   [name] Kitchen Penicillins Rash    Has patient had a PCN reaction causing immediate rash, facial/tongue/throat swelling, SOB or lightheadedness with hypotension: Unknown Has patient had a PCN reaction causing severe rash involving mucus membranes or skin necrosis: Unknown Has patient had a PCN reaction that required hospitalization: Unknown Has patient had a PCN reaction occurring within the last 10 years: Unknown If all of the above answers are "NO", then may proceed with Cephalosporin use.   . Rosuvastatin Other (See Comments)    Unknown     Family History  Problem Relation Age of Onset  . Hernia Mother   . Heart disease Father        smoker  . COPD Father   . Hyperthyroidism Son   . Colon cancer Neg Hx     BP 132/72 (BP Location: Left Arm, Patient Position: Sitting, Cuff Size: Normal)   Pulse 85   Wt 190 lb (86.2 kg)   SpO2 97%   BMI 25.07 kg/m    Review of Systems Denies pain at the ant neck.      Objective:   Physical Exam VITAL SIGNS:  See vs page GENERAL: no distress NECK: There is no palpable thyroid  enlargement.  No thyroid nodule is palpable.  No palpable lymphadenopathy at the anterior neck.    Lab Results  Component Value Date   TSH <0.01 (L) 05/17/2018      Assessment & Plan:  Hyperthyroidism, new to me: we discussed rx options.  he declines RAI.  I rx'ed tapazole.   Patient Instructions  blood tests are requested for you today.  We'll let you know about the results. Please come back for a follow-up appointment in 3 months

## 2018-05-25 ENCOUNTER — Ambulatory Visit: Payer: Medicare Other | Admitting: Family Medicine

## 2018-06-07 ENCOUNTER — Ambulatory Visit: Payer: Medicare Other | Admitting: Family Medicine

## 2018-06-07 ENCOUNTER — Encounter: Payer: Self-pay | Admitting: Family Medicine

## 2018-06-07 VITALS — BP 132/76 | HR 74 | Temp 98.5°F | Ht 73.0 in | Wt 189.8 lb

## 2018-06-07 DIAGNOSIS — E0590 Thyrotoxicosis, unspecified without thyrotoxic crisis or storm: Secondary | ICD-10-CM | POA: Diagnosis not present

## 2018-06-07 DIAGNOSIS — I10 Essential (primary) hypertension: Secondary | ICD-10-CM

## 2018-06-07 DIAGNOSIS — E538 Deficiency of other specified B group vitamins: Secondary | ICD-10-CM | POA: Diagnosis not present

## 2018-06-07 MED ORDER — CYANOCOBALAMIN 1000 MCG/ML IJ SOLN
1000.0000 ug | Freq: Once | INTRAMUSCULAR | Status: AC
Start: 2018-06-07 — End: 2018-06-07
  Administered 2018-06-07: 1000 ug via INTRAMUSCULAR

## 2018-06-07 NOTE — Progress Notes (Signed)
Subjective:  Timothy Gallagher is a 82 y.o. year old very pleasant male patient who presents for/with See problem oriented charting ROS- dizziness wit standing. No chest pain reported. No shortness of breath reported. No edema.    Past Medical History-  Patient Active Problem List   Diagnosis Date Noted  . Hyperthyroidism 01/13/2018    Priority: High  . Detached retina, left 06/30/2017    Priority: High  . AAA (abdominal aortic aneurysm) ([place]) 05/12/2017    Priority: High  . CAD (coronary artery disease) of artery bypass graft 05/19/2014    Priority: High  . BPPV (benign paroxysmal positional vertigo) 09/01/2016    Priority: Medium  . Insomnia 11/22/2014    Priority: Medium  . Anemia 07/20/2014    Priority: Medium  . BPH (benign prostatic hyperplasia) 12/05/2013    Priority: Medium  . Hyperlipidemia 03/13/2008    Priority: Medium  . B12 deficiency 06/07/2007    Priority: Medium  . Essential hypertension 04/09/2007    Priority: Medium  . Left sided abdominal pain 04/15/2017    Priority: Low  . Low back pain 02/06/2017    Priority: Low  . Anal fissure 04/07/2016    Priority: Low  . Internal and external bleeding hemorrhoids 01/22/2015    Priority: Low  . Palpitations 02/11/2013    Priority: Low  . OSTEOARTHRITIS, WRIST, RIGHT 08/05/2010    Priority: Low  . ACNE ROSACEA 06/27/2009    Priority: Low  . ESOPHAGEAL STRICTURE 04/27/2009    Priority: Low  . ACTINIC KERATOSIS, HEAD 04/18/2009    Priority: Low  . NECK PAIN 09/14/2008    Priority: Low  . NEUROPATHY, IDIOPATHIC PERIPHERAL NEC 08/11/2007    Priority: Low  . Irritable bowel syndrome 08/11/2007    Priority: Low  . ALLERGIC RHINITIS 04/09/2007    Priority: Low  . Chest pain 03/06/2018    Medications- reviewed and updated Current Outpatient Medications  Medication Sig Dispense Refill  . acetaminophen (TYLENOL) 500 MG tablet Take 500 mg by mouth daily as needed (for pain). Reported on 04/11/2016    . aspirin  81 MG EC tablet Take 1 tablet (81 mg total) by mouth daily. (Patient taking differently: Take 81 mg by mouth every evening. )    . atorvastatin (LIPITOR) 20 MG tablet Take 1 tablet (20 mg total) by mouth daily. 90 tablet 3  . clindamycin (CLEOCIN) 150 MG capsule Take 3 capsules (450 mg total) by mouth 3 (three) times daily. 90 capsule 0  . fluticasone (FLONASE) 50 MCG/ACT nasal spray SHAKE LIQUID AND USE 1 TO 2 SPRAYS IN EACH NOSTRIL DAILY AS NEEDED FOR ALLERGIES 16 g 5  . methimazole (TAPAZOLE) 10 MG tablet Take 1 tablet (10 mg total) by mouth daily. 30 tablet 3  . Multiple Vitamins-Minerals (CENTRUM SILVER PO) Take 1 tablet by mouth daily.    . tamsulosin (FLOMAX) 0.4 MG CAPS capsule Take 0.4 mg by mouth daily after supper.   3  . zolpidem (AMBIEN) 5 MG tablet TAKE 1 TABLET BY MOUTH AT BEDTIME AS NEEDED FOR SLEEP 30 tablet 5   No current facility-administered medications for this visit.     Objective: BP 132/76 (BP Location: Left Arm, Patient Position: Sitting, Cuff Size: Normal)   Pulse 74   Temp 98.5 F (36.9 C) (Oral)   Ht 6\' 1"  (1.854 m)   Wt 189 lb 12.8 oz (86.1 kg)   SpO2 96%   BMI 25.04 kg/m  Gen: NAD, resting comfortably CV: RRR no murmurs  rubs or gallops Lungs: CTAB no crackles, wheeze, rhonchi Abdomen: soft/nontender/nondistended/normal bowel sounds.  Ext: no edema Skin: warm, dry  Assessment/Plan:  Parotitis S: patient treated for parotitis with clindamycin 3x a day 450mg  a few weeks ago A/P:thrilled with resolution with outpatient treatment  Essential hypertension S: controlled on no rx at present BP Readings from Last 3 Encounters:  06/07/18 132/76  05/17/18 132/72  05/12/18 138/78  A/P: We discussed blood pressure goal of <140/90. Continue without rx.   Hyperthyroidism S: started methimazole this month- slightly more dizzy- no falls fortunately. Dr. Loanne Drilling providing excellent care A/P: I dont see dizziness as side effect of methimazole on up to date.  Discussed with patient didn't think this was cause and would definitely continue. Discussed flomax more likely cause but with severe UTI/hospitalization he had off meds last year didn't feel we could stop it at present    Future Appointments  Date Time Provider [place]  06/28/2018  7:30 AM Renato Shin, MD [facility] None  07/08/2018  9:00 AM [facility] NURSE [facility]  08/17/2018  9:00 AM Renato Shin, MD [facility] None  09/27/2018  8:45 AM [name], MD [facility]   Return in about 1 month (around 07/08/2018) for Monthly B12 Injection.  We have a visit in 4 months planne din November  Lab/Order associations: Vitamin B12 deficiency - Plan: cyanocobalamin ((VITAMIN B-12)) injection 1,000 mcg  Essential hypertension  Hyperthyroidism  Meds ordered this encounter  Medications  . cyanocobalamin ((VITAMIN B-12)) injection 1,000 mcg   Return precautions advised.  [name], MD

## 2018-06-07 NOTE — Assessment & Plan Note (Signed)
S: started methimazole this month- slightly more dizzy- no falls fortunately. Dr. Loanne Drilling providing excellent care A/P: I dont see dizziness as side effect of methimazole on up to date. Discussed with patient didn't think this was cause and would definitely continue. Discussed flomax more likely cause but with severe UTI/hospitalization he had off meds last year didn't feel we could stop it at present

## 2018-06-07 NOTE — Patient Instructions (Addendum)
I would also like for you to sign up for an annual wellness visit with one of our nurses, Cassie or Manuela Schwartz, who both specialize in the annual wellness visit. This is a free benefit under medicare that may help Timothy Gallagher find additional ways to help you. Some highlights are reviewing medications, lifestyle, and doing a dementia screen.  Please check with your pharmacy to see if they have the shingrix vaccine. If they do- please get this immunization and update Timothy Gallagher by phone call or mychart with dates you receive the vaccine  No changes today

## 2018-06-07 NOTE — Assessment & Plan Note (Signed)
S: controlled on no rx at present BP Readings from Last 3 Encounters:  06/07/18 132/76  05/17/18 132/72  05/12/18 138/78  A/P: We discussed blood pressure goal of <140/90. Continue without rx.

## 2018-06-14 ENCOUNTER — Ambulatory Visit (INDEPENDENT_AMBULATORY_CARE_PROVIDER_SITE_OTHER): Payer: Medicare Other

## 2018-06-14 VITALS — BP 138/72 | HR 82 | Ht 73.0 in | Wt 190.2 lb

## 2018-06-14 DIAGNOSIS — Z0000 Encounter for general adult medical examination without abnormal findings: Secondary | ICD-10-CM | POA: Diagnosis not present

## 2018-06-14 NOTE — Progress Notes (Signed)
PCP notes:Return in about 1 month (around 07/08/2018) for Monthly B12 Injection.  We have a visit in 4 months planned in November    Health maintenance:Up to date   Abnormal screenings: None   Patient concerns:None    Nurse concerns:None   Next PCP appt: 09/27/2018

## 2018-06-14 NOTE — Progress Notes (Addendum)
Subjective:   Timothy Gallagher is a 82 y.o. male who presents for Medicare Annual/Subsequent preventive examination.  Review of Systems:  No ROS.  Medicare Wellness Visit. Additional risk factors are reflected in the social history. Cardiac Risk Factors include: advanced age (>26men, >71 women);male gender  Patient lives in a single story home with his wife. No animals in the home. Does have grand children that he is active with.   Sons live close by. Patient and wife attend different churches each Sunday.   Patient works crosswords puzzles daily in the newspaper. Plays instruments (piano, guitar, harmonica), Enjoys working in the garden  Patient sleeps 4-5 hours a night. Does take Ambien to help sleep. No CPAP.  Objective:    Vitals: BP 138/72 (BP Location: Left Arm, Patient Position: Sitting, Cuff Size: Large)   Pulse 82   Ht 6\' 1"  (1.854 m)   Wt 190 lb 3.2 oz (86.3 kg)   SpO2 96%   BMI 25.09 kg/m   Body mass index is 25.09 kg/m.  Advanced Directives 06/14/2018 07/31/2017 07/31/2017 04/07/2017 05/20/2014 05/12/2014 12/20/2013  Does Patient Have a Medical Advance Directive? No No No No Patient does not have advance directive Patient does not have advance directive Patient does not have advance directive;Patient would not like information  Would patient like information on creating a medical advance directive? No - Patient declined No - Patient declined - - - - -  Pre-existing out of facility DNR order (yellow form or pink MOST form) - - - - No - -    Tobacco Social History   Tobacco Use  Smoking Status Former Smoker  . Packs/day: 0.50  . Years: 42.00  . Pack years: 21.00  . Types: Cigarettes  . Last attempt to quit: 11/17/1998  . Years since quitting: 19.5  Smokeless Tobacco Never Used     Counseling given: Not Answered      Past Medical History:  Diagnosis Date  . ACNE ROSACEA 06/27/2009  . ACTINIC KERATOSIS, HEAD 04/18/2009  . Acute maxillary sinusitis 05/14/2010  .  ALLERGIC RHINITIS 04/09/2007  . Anal fissure 04/07/2016  . B12 DEFICIENCY 06/07/2007  . BACK PAIN WITH RADICULOPATHY 04/24/2008  . Cancer ([place])    skin  . CHEST WALL PAIN, ACUTE 06/15/2009  . Chronic maxillary sinusitis 05/29/2008  . COLITIS 04/27/2009  . COLONIC POLYPS, HX OF 04/27/2009   tubular adenomas  . Coronary artery disease 05/12/2014   Cath 05/12/2014 w/ severe 3-vessel CAD and preserved LV function, EF 55%  . DERMATITIS, ATOPIC 10/12/2007  . DIVERTICULOSIS, COLON 04/27/2009  . ECCHYMOSES, SPONTANEOUS 06/27/2008  . Elevated sedimentation rate 05/02/2009  . ESOPHAGEAL STRICTURE 04/27/2009  . GASTRITIS, CHRONIC 04/27/2009  . HYPERLIPIDEMIA 03/13/2008  . HYPERTENSION 04/09/2007  . Internal bleeding hemorrhoids 01/22/2015   01/22/2015 Seen at anoscopy, grade 1 all 3 positions   . Irritable bowel syndrome 08/11/2007  . KIDNEY DISEASE 04/09/2007  . NECK PAIN 09/14/2008  . NEUROPATHY, IDIOPATHIC PERIPHERAL NEC 08/11/2007  . OSTEOARTHRITIS, WRIST, RIGHT 08/05/2010  . Postoperative delirium 05/20/2014  . S/P CABG x 4 05/19/2014   LIMA to LAD, SVG to diag, SVG to OM, SVG to PDA, EVH via right thigh and leg   Past Surgical History:  Procedure Laterality Date  . CARDIAC CATHETERIZATION    . COLONOSCOPY    . CORONARY ARTERY BYPASS GRAFT N/A 05/19/2014   Procedure: CORONARY ARTERY BYPASS GRAFTING (CABG);  Surgeon: Rexene Alberts, MD;  Location: [place];  Service: Open Heart Surgery;  Laterality: N/A;  Times 4 using left internal mammary artery and endoscopically harvested right saphenous vein  . ESOPHAGOGASTRODUODENOSCOPY    . FINGER SURGERY     cut off end of finger  . FLEXIBLE SIGMOIDOSCOPY    . HEMORRHOID BANDING    . HERNIA REPAIR    . INCISION AND DRAINAGE WOUND WITH FOREIGN BODY REMOVAL Left 12/20/2013   Procedure: INCISION AND DRAINAGE LEFT INDEX FINGER;  Surgeon: Tennis Must, MD;  Location: [facility];  Service: Orthopedics;  Laterality: Left;  . INTRAOPERATIVE TRANSESOPHAGEAL ECHOCARDIOGRAM N/A  05/19/2014   Procedure: INTRAOPERATIVE TRANSESOPHAGEAL ECHOCARDIOGRAM;  Surgeon: Rexene Alberts, MD;  Location: [facility];  Service: Open Heart Surgery;  Laterality: N/A;  . lamenectomy    . LEFT HEART CATHETERIZATION WITH CORONARY ANGIOGRAM N/A 05/12/2014   Procedure: LEFT HEART CATHETERIZATION WITH CORONARY ANGIOGRAM;  Surgeon: Sinclair Grooms, MD;  Location: [facility];  Service: Cardiovascular;  Laterality: N/A;  . LUMBAR FUSION    . TONSILLECTOMY    . VARICOSE VEIN SURGERY Left    Family History  Problem Relation Age of Onset  . Hernia Mother   . Heart disease Father        smoker  . COPD Father   . Heart attack Brother   . Early death Daughter   . Colon cancer Neg Hx    Social History   Socioeconomic History  . Marital status: Married    Spouse name: Not on file  . Number of children: 4  . Years of education: Not on file  . Highest education level: Not on file  Occupational History  . Occupation: retired    Fish farm manager: RETIRED    Comment: from [employer]  . Financial resource strain: Not on file  . Food insecurity:    Worry: Not on file    Inability: Not on file  . Transportation needs:    Medical: Not on file    Non-medical: Not on file  Tobacco Use  . Smoking status: Former Smoker    Packs/day: 0.50    Years: 42.00    Pack years: 21.00    Types: Cigarettes    Last attempt to quit: 11/17/1998    Years since quitting: 19.5  . Smokeless tobacco: Never Used  Substance and Sexual Activity  . Alcohol use: No  . Drug use: No  . Sexual activity: Yes  Lifestyle  . Physical activity:    Days per week: Not on file    Minutes per session: Not on file  . Stress: Not on file  Relationships  . Social connections:    Talks on phone: Not on file    Gets together: Not on file    Attends religious service: Not on file    Active member of club or organization: Not on file    Attends meetings of clubs or organizations: Not on file    Relationship status: Not  on file  Other Topics Concern  . Not on file  Social History Narrative   Married 35 years (2nd marriage). 2 kids from previous marriage. 2 stepchildren. 8 grandkids.       Retired from [employer]: walking/exercise, building in shop-furniture (cut finger off in February doing this)   6 grand children - Has bought them all a new car    Brother died of probably addiction   Sister also died; not sure of cause    He enjoys his  life; Likes to play the keyboard; guitar    Had played in a gospel quartet     Outpatient Encounter Medications as of 06/14/2018  Medication Sig  . acetaminophen (TYLENOL) 500 MG tablet Take 500 mg by mouth daily as needed (for pain). Reported on 04/11/2016  . aspirin 81 MG EC tablet Take 1 tablet (81 mg total) by mouth daily. (Patient taking differently: Take 81 mg by mouth every evening. )  . fluticasone (FLONASE) 50 MCG/ACT nasal spray SHAKE LIQUID AND USE 1 TO 2 SPRAYS IN EACH NOSTRIL DAILY AS NEEDED FOR ALLERGIES  . methimazole (TAPAZOLE) 10 MG tablet Take 1 tablet (10 mg total) by mouth daily.  . Multiple Vitamins-Minerals (CENTRUM SILVER PO) Take 1 tablet by mouth daily.  . tamsulosin (FLOMAX) 0.4 MG CAPS capsule Take 0.4 mg by mouth daily after supper.   . zolpidem (AMBIEN) 5 MG tablet TAKE 1 TABLET BY MOUTH AT BEDTIME AS NEEDED FOR SLEEP  . atorvastatin (LIPITOR) 20 MG tablet Take 1 tablet (20 mg total) by mouth daily.  . [DISCONTINUED] clindamycin (CLEOCIN) 150 MG capsule Take 3 capsules (450 mg total) by mouth 3 (three) times daily.   No facility-administered encounter medications on file as of 06/14/2018.     Activities of Daily Living In your present state of health, do you have any difficulty performing the following activities: 06/14/2018 07/31/2017  Hearing? N N  Comment Wears hearing aids bilaterally -  Vision? Y Y  Comment Sees eye dr frequently, hx of detached retina in left eye had detached retina surgery on left    Difficulty concentrating or making decisions? N N  Walking or climbing stairs? N N  Dressing or bathing? N N  Doing errands, shopping? N N  Preparing Food and eating ? N -  Using the Toilet? N -  In the past six months, have you accidently leaked urine? N -  Do you have problems with loss of bowel control? N -  Managing your Medications? N -  Managing your Finances? N -  Housekeeping or managing your Housekeeping? N -  Some recent data might be hidden    Patient Care Team: [name], MD as PCP - General (Family Medicine) Bjorn Loser, MD as Consulting Physician (Urology)   Assessment:   This is a routine wellness examination for Timothy Gallagher.  Exercise Activities and Dietary recommendations Current Exercise Habits: Home exercise routine, Type of exercise: walking, Time (Minutes): 60, Frequency (Times/Week): 7, Weekly Exercise (Minutes/Week): 420, Intensity: Mild, Exercise limited by: cardiac condition(s)   Breakfast; Donuts and coffee (decaffinated with sugar and milk), bacon and eggs Lunch: Meatloaf, potato salad, pineapple cake Dinner: Tomato sandwiches   Drinks up to a gallon of water a day, drinks green tea, occasional regular soda Goals    . Exercise 150 min/wk Moderate Activity     Increase physical activity        Fall Risk Fall Risk  06/14/2018 04/13/2018 04/07/2017 03/09/2017 02/19/2016  Falls in the past year? No No No No No     Depression Screen PHQ 2/9 Scores 06/14/2018 04/13/2018 04/07/2017 03/09/2017  PHQ - 2 Score 0 0 0 0    Cognitive Function MMSE - Mini Mental State Exam 04/07/2017  Not completed: (No Data)     6CIT Screen 06/14/2018  What Year? 0 points  What month? 0 points  What time? 0 points  Count back from 20 0 points  Months in reverse 0 points    Immunization History  Administered  Date(s) Administered  . Influenza Split 08/11/2011, 08/05/2012  . Influenza Whole 08/31/2007, 08/15/2008, 08/20/2009, 08/05/2010  . Influenza, High Dose  Seasonal PF 07/28/2016, 09/10/2017  . Influenza,inj,Quad PF,6+ Mos 07/15/2013, 07/20/2014, 08/10/2015  . Pneumococcal Conjugate-13 11/04/2013  . Pneumococcal Polysaccharide-23 02/05/2015  . Td 03/18/2007  . Tdap 06/04/2018  . Zoster 04/16/2011      Screening Tests Health Maintenance  Topic Date Due  . INFLUENZA VACCINE  06/17/2018  . TETANUS/TDAP  06/04/2028  . PNA vac Low Risk Adult  Completed      Plan:    Follow Up with PCP  I have personally reviewed and noted the following in the patient's chart:   . Medical and social history . Use of alcohol, tobacco or illicit drugs  . Current medications and supplements . Functional ability and status . Nutritional status . Physical activity . Advanced directives . List of other physicians . Vitals . Screenings to include cognitive, depression, and falls . Referrals and appointments  In addition, I have reviewed and discussed with patient certain preventive protocols, quality metrics, and best practice recommendations. A written personalized care plan for preventive services as well as general preventive health recommendations were provided to patient.     [place], [place]  4/36/0677

## 2018-06-14 NOTE — Patient Instructions (Signed)
Timothy Gallagher , Thank you for taking time to come for your Medicare Wellness Visit. I appreciate your ongoing commitment to your health goals. Please review the following plan we discussed and let me know if I can assist you in the future.   These are the goals we discussed: Goals    . Exercise 150 min/wk Moderate Activity     Increase physical activity        This is a list of the screening recommended for you and due dates:  Health Maintenance  Topic Date Due  . Flu Shot  06/17/2018  . Tetanus Vaccine  06/04/2028  . Pneumonia vaccines  Completed   Preventive Care for Adults  A healthy lifestyle and preventive care can promote health and wellness. Preventive health guidelines for adults include the following key practices.  . A routine yearly physical is a good way to check with your health care provider about your health and preventive screening. It is a chance to share any concerns and updates on your health and to receive a thorough exam.  . Visit your dentist for a routine exam and preventive care every 6 months. Brush your teeth twice a day and floss once a day. Good oral hygiene prevents tooth decay and gum disease.  . The frequency of eye exams is based on your age, health, family medical history, use  of contact lenses, and other factors. Follow your health care provider's recommendations for frequency of eye exams.  . Eat a healthy diet. Foods like vegetables, fruits, whole grains, low-fat dairy products, and lean protein foods contain the nutrients you need without too many calories. Decrease your intake of foods high in solid fats, added sugars, and salt. Eat the right amount of calories for you. Get information about a proper diet from your health care provider, if necessary.  . Regular physical exercise is one of the most important things you can do for your health. Most adults should get at least 150 minutes of moderate-intensity exercise (any activity that increases  your heart rate and causes you to sweat) each week. In addition, most adults need muscle-strengthening exercises on 2 or more days a week.  Silver Sneakers may be a benefit available to you. To determine eligibility, you may visit the website: www.silversneakers.com or contact program at 346-530-6038 Mon-Fri between 8AM-8PM.   . Maintain a healthy weight. The body mass index (BMI) is a screening tool to identify possible weight problems. It provides an estimate of body fat based on height and weight. Your health care provider can find your BMI and can help you achieve or maintain a healthy weight.   For adults 20 years and older: ? A BMI below 18.5 is considered underweight. ? A BMI of 18.5 to 24.9 is normal. ? A BMI of 25 to 29.9 is considered overweight. ? A BMI of 30 and above is considered obese.   . Maintain normal blood lipids and cholesterol levels by exercising and minimizing your intake of saturated fat. Eat a balanced diet with plenty of fruit and vegetables. Blood tests for lipids and cholesterol should begin at age 67 and be repeated every 5 years. If your lipid or cholesterol levels are high, you are over 50, or you are at high risk for heart disease, you may need your cholesterol levels checked more frequently. Ongoing high lipid and cholesterol levels should be treated with medicines if diet and exercise are not working.  . If you smoke, find out from your health  care provider how to quit. If you do not use tobacco, please do not start.  . If you choose to drink alcohol, please do not consume more than 2 drinks per day. One drink is considered to be 12 ounces (355 mL) of beer, 5 ounces (148 mL) of wine, or 1.5 ounces (44 mL) of liquor.  . If you are 35-18 years old, ask your health care provider if you should take aspirin to prevent strokes.  . Use sunscreen. Apply sunscreen liberally and repeatedly throughout the day. You should seek shade when your shadow is shorter than you.  Protect yourself by wearing long sleeves, pants, a wide-brimmed hat, and sunglasses year round, whenever you are outdoors.  . Once a month, do a whole body skin exam, using a mirror to look at the skin on your back. Tell your health care provider of new moles, moles that have irregular borders, moles that are larger than a pencil eraser, or moles that have changed in shape or color.

## 2018-06-14 NOTE — Progress Notes (Signed)
I have personally reviewed the Medicare Annual Wellness questionnaire and have noted 1. The patient's medical and social history 2. Their use of alcohol, tobacco or illicit drugs 3. Their current medications and supplements 4. The patient's functional ability including ADL's, fall risks, home safety risks and hearing or visual impairment. 5. Diet and physical activities 6. Evidence for depression or mood disorders 7. Reviewed Updated provider list, see scanned forms and [facility] Snapshot.   The patients weight, height, BMI and visual acuity have been recorded in the chart I have made referrals, counseling and provided education to the patient based review of the above and I have provided the pt with a written personalized care plan for preventive services.  I have provided the patient with a copy of your personalized plan for preventive services. Instructed to take the time to review along with their updated medication list.  Sheria Rosello, DO  

## 2018-06-15 ENCOUNTER — Ambulatory Visit: Payer: Medicare Other

## 2018-06-28 ENCOUNTER — Encounter: Payer: Self-pay | Admitting: Endocrinology

## 2018-06-28 ENCOUNTER — Ambulatory Visit (INDEPENDENT_AMBULATORY_CARE_PROVIDER_SITE_OTHER): Payer: Medicare Other | Admitting: Endocrinology

## 2018-06-28 VITALS — BP 149/82 | HR 81 | Wt 190.0 lb

## 2018-06-28 DIAGNOSIS — E0590 Thyrotoxicosis, unspecified without thyrotoxic crisis or storm: Secondary | ICD-10-CM | POA: Diagnosis not present

## 2018-06-28 LAB — TSH: TSH: 0.98 u[IU]/mL (ref 0.35–4.50)

## 2018-06-28 LAB — T4, FREE: Free T4: 0.62 ng/dL (ref 0.60–1.60)

## 2018-06-28 NOTE — Patient Instructions (Signed)
blood tests are requested for you today.  We'll let you know about the results.   If ever you have fever while taking methimazole, stop it and call us, even if the reason is obvious, because of the risk of a rare side-effect.  Please come back for a follow-up appointment in 2 months.   

## 2018-06-28 NOTE — Progress Notes (Signed)
Subjective:    Patient ID: Timothy Gallagher, male    DOB: [birth year], 82 y.o.   MRN: [id number]  HPI Pt returns for f/u of hyperthyroidism (dx'ed early 2019; TFT  Improved initially, but recurred in July, 2019, and was started on tapazole; he has never had thyroid imaging; he takes toprol for CAD, not thyroid).  pt states he feels better in general.  Past Medical History:  Diagnosis Date  . ACNE ROSACEA 06/27/2009  . ACTINIC KERATOSIS, HEAD 04/18/2009  . Acute maxillary sinusitis 05/14/2010  . ALLERGIC RHINITIS 04/09/2007  . Anal fissure 04/07/2016  . B12 DEFICIENCY 06/07/2007  . BACK PAIN WITH RADICULOPATHY 04/24/2008  . Cancer ([place])    skin  . CHEST WALL PAIN, ACUTE 06/15/2009  . Chronic maxillary sinusitis 05/29/2008  . COLITIS 04/27/2009  . COLONIC POLYPS, HX OF 04/27/2009   tubular adenomas  . Coronary artery disease 05/12/2014   Cath 05/12/2014 w/ severe 3-vessel CAD and preserved LV function, EF 55%  . DERMATITIS, ATOPIC 10/12/2007  . DIVERTICULOSIS, COLON 04/27/2009  . ECCHYMOSES, SPONTANEOUS 06/27/2008  . Elevated sedimentation rate 05/02/2009  . ESOPHAGEAL STRICTURE 04/27/2009  . GASTRITIS, CHRONIC 04/27/2009  . HYPERLIPIDEMIA 03/13/2008  . HYPERTENSION 04/09/2007  . Internal bleeding hemorrhoids 01/22/2015   01/22/2015 Seen at anoscopy, grade 1 all 3 positions   . Irritable bowel syndrome 08/11/2007  . KIDNEY DISEASE 04/09/2007  . NECK PAIN 09/14/2008  . NEUROPATHY, IDIOPATHIC PERIPHERAL NEC 08/11/2007  . OSTEOARTHRITIS, WRIST, RIGHT 08/05/2010  . Postoperative delirium 05/20/2014  . S/P CABG x 4 05/19/2014   LIMA to LAD, SVG to diag, SVG to OM, SVG to PDA, EVH via right thigh and leg    Past Surgical History:  Procedure Laterality Date  . CARDIAC CATHETERIZATION    . COLONOSCOPY    . CORONARY ARTERY BYPASS GRAFT N/A 05/19/2014   Procedure: CORONARY ARTERY BYPASS GRAFTING (CABG);  Surgeon: Rexene Alberts, MD;  Location: [place];  Service: Open Heart Surgery;  Laterality: N/A;  Times 4 using  left internal mammary artery and endoscopically harvested right saphenous vein  . ESOPHAGOGASTRODUODENOSCOPY    . FINGER SURGERY     cut off end of finger  . FLEXIBLE SIGMOIDOSCOPY    . HEMORRHOID BANDING    . HERNIA REPAIR    . INCISION AND DRAINAGE WOUND WITH FOREIGN BODY REMOVAL Left 12/20/2013   Procedure: INCISION AND DRAINAGE LEFT INDEX FINGER;  Surgeon: Tennis Must, MD;  Location: [facility];  Service: Orthopedics;  Laterality: Left;  . INTRAOPERATIVE TRANSESOPHAGEAL ECHOCARDIOGRAM N/A 05/19/2014   Procedure: INTRAOPERATIVE TRANSESOPHAGEAL ECHOCARDIOGRAM;  Surgeon: Rexene Alberts, MD;  Location: [place];  Service: Open Heart Surgery;  Laterality: N/A;  . lamenectomy    . LEFT HEART CATHETERIZATION WITH CORONARY ANGIOGRAM N/A 05/12/2014   Procedure: LEFT HEART CATHETERIZATION WITH CORONARY ANGIOGRAM;  Surgeon: Sinclair Grooms, MD;  Location: [facility];  Service: Cardiovascular;  Laterality: N/A;  . LUMBAR FUSION    . TONSILLECTOMY    . VARICOSE VEIN SURGERY Left     Social History   Socioeconomic History  . Marital status: Married    Spouse name: Not on file  . Number of children: 4  . Years of education: Not on file  . Highest education level: Not on file  Occupational History  . Occupation: retired    Fish farm manager: RETIRED    Comment: from [employer]  . Financial resource strain: Not on file  . Food insecurity:  Worry: Not on file    Inability: Not on file  . Transportation needs:    Medical: Not on file    Non-medical: Not on file  Tobacco Use  . Smoking status: Former Smoker    Packs/day: 0.50    Years: 42.00    Pack years: 21.00    Types: Cigarettes    Last attempt to quit: 11/17/1998    Years since quitting: 19.6  . Smokeless tobacco: Never Used  Substance and Sexual Activity  . Alcohol use: No  . Drug use: No  . Sexual activity: Yes  Lifestyle  . Physical activity:    Days per week: Not on file    Minutes per session: Not on file  . Stress:  Not on file  Relationships  . Social connections:    Talks on phone: Not on file    Gets together: Not on file    Attends religious service: Not on file    Active member of club or organization: Not on file    Attends meetings of clubs or organizations: Not on file    Relationship status: Not on file  . Intimate partner violence:    Fear of current or ex partner: Not on file    Emotionally abused: Not on file    Physically abused: Not on file    Forced sexual activity: Not on file  Other Topics Concern  . Not on file  Social History Narrative   Married 35 years (2nd marriage). 2 kids from previous marriage. 2 stepchildren. 8 grandkids.       Retired from [employer]: walking/exercise, building in shop-furniture (cut finger off in February doing this)   6 grand children - Has bought them all a new car    Brother died of probably addiction   Sister also died; not sure of cause    He enjoys his life; Likes to play the keyboard; guitar    Had played in a gospel quartet     Current Outpatient Medications on File Prior to Visit  Medication Sig Dispense Refill  . acetaminophen (TYLENOL) 500 MG tablet Take 500 mg by mouth daily as needed (for pain). Reported on 04/11/2016    . aspirin 81 MG EC tablet Take 1 tablet (81 mg total) by mouth daily. (Patient taking differently: Take 81 mg by mouth every evening. )    . fluticasone (FLONASE) 50 MCG/ACT nasal spray SHAKE LIQUID AND USE 1 TO 2 SPRAYS IN EACH NOSTRIL DAILY AS NEEDED FOR ALLERGIES 16 g 5  . methimazole (TAPAZOLE) 10 MG tablet Take 1 tablet (10 mg total) by mouth daily. 30 tablet 3  . Multiple Vitamins-Minerals (CENTRUM SILVER PO) Take 1 tablet by mouth daily.    . tamsulosin (FLOMAX) 0.4 MG CAPS capsule Take 0.4 mg by mouth daily after supper.   3  . zolpidem (AMBIEN) 5 MG tablet TAKE 1 TABLET BY MOUTH AT BEDTIME AS NEEDED FOR SLEEP 30 tablet 5  . atorvastatin (LIPITOR) 20 MG tablet Take 1 tablet (20 mg  total) by mouth daily. 90 tablet 3   No current facility-administered medications on file prior to visit.     Allergies  Allergen Reactions  . Lipitor [Atorvastatin] Other (See Comments)    REACTION: nausea and blurred vision  . Trazodone And Nefazodone Other (See Comments)    dizzy  . Ciprofloxacin Swelling  . Mycophenolate Mofetil Other (See Comments)    REACTION: unspecified  . Amoxicillin  Rash    Has patient had a PCN reaction causing immediate rash, facial/tongue/throat swelling, SOB or lightheadedness with hypotension: Unknown Has patient had a PCN reaction causing severe rash involving mucus membranes or skin necrosis: Unknown Has patient had a PCN reaction that required hospitalization: Unknown Has patient had a PCN reaction occurring within the last 10 years: Unknown If all of the above answers are "NO", then may proceed with Cephalosporin use.   [name] Kitchen Penicillins Rash    Has patient had a PCN reaction causing immediate rash, facial/tongue/throat swelling, SOB or lightheadedness with hypotension: Unknown Has patient had a PCN reaction causing severe rash involving mucus membranes or skin necrosis: Unknown Has patient had a PCN reaction that required hospitalization: Unknown Has patient had a PCN reaction occurring within the last 10 years: Unknown If all of the above answers are "NO", then may proceed with Cephalosporin use.   . Rosuvastatin Other (See Comments)    Unknown     Family History  Problem Relation Age of Onset  . Hernia Mother   . Heart disease Father        smoker  . COPD Father   . Heart attack Brother   . Early death Daughter   . Colon cancer Neg Hx     BP (!) 149/82 (BP Location: Left Arm, Patient Position: Sitting, Cuff Size: Normal)   Pulse 81   Wt 190 lb (86.2 kg)   BMI 25.07 kg/m    Review of Systems Denies fever    Objective:   Physical Exam VITAL SIGNS:  See vs page GENERAL: no distress NECK: There is no palpable thyroid enlargement.   No thyroid nodule is palpable.  No palpable lymphadenopathy at the anterior neck.       Assessment & Plan:  Hyperthyroidism, uncertain etiology, recurrent. We discussed rx options.  he declines RAI.   CAD: in this context, he needs to maintain euthyroidism  Patient Instructions  blood tests are requested for you today.  We'll let you know about the results. If ever you have fever while taking methimazole, stop it and call us, even if the reason is obvious, because of the risk of a rare side-effect.  Please come back for a follow-up appointment in 2 months.

## 2018-07-08 ENCOUNTER — Ambulatory Visit: Payer: Medicare Other

## 2018-07-09 ENCOUNTER — Encounter: Payer: Self-pay | Admitting: Family Medicine

## 2018-07-09 ENCOUNTER — Ambulatory Visit: Payer: Medicare Other | Admitting: Family Medicine

## 2018-07-09 VITALS — BP 130/80 | HR 70 | Temp 98.3°F | Ht 73.0 in | Wt 189.8 lb

## 2018-07-09 DIAGNOSIS — R195 Other fecal abnormalities: Secondary | ICD-10-CM | POA: Diagnosis not present

## 2018-07-09 DIAGNOSIS — E0590 Thyrotoxicosis, unspecified without thyrotoxic crisis or storm: Secondary | ICD-10-CM

## 2018-07-09 DIAGNOSIS — H01021 Squamous blepharitis right upper eyelid: Secondary | ICD-10-CM | POA: Diagnosis not present

## 2018-07-09 DIAGNOSIS — H04123 Dry eye syndrome of bilateral lacrimal glands: Secondary | ICD-10-CM | POA: Diagnosis not present

## 2018-07-09 DIAGNOSIS — E538 Deficiency of other specified B group vitamins: Secondary | ICD-10-CM

## 2018-07-09 DIAGNOSIS — H43391 Other vitreous opacities, right eye: Secondary | ICD-10-CM | POA: Diagnosis not present

## 2018-07-09 DIAGNOSIS — H01022 Squamous blepharitis right lower eyelid: Secondary | ICD-10-CM | POA: Diagnosis not present

## 2018-07-09 DIAGNOSIS — I10 Essential (primary) hypertension: Secondary | ICD-10-CM

## 2018-07-09 MED ORDER — CYANOCOBALAMIN 1000 MCG/ML IJ SOLN
1000.0000 ug | Freq: Once | INTRAMUSCULAR | Status: AC
  Administered 2018-07-09: 1000 ug via INTRAMUSCULAR

## 2018-07-09 MED ORDER — CYANOCOBALAMIN 1000 MCG/ML IJ SOLN: 1000.0000 ug | Freq: Once | INTRAMUSCULAR | Status: DC

## 2018-07-09 NOTE — Assessment & Plan Note (Signed)
Lab Results  Component Value Date   TSH 0.98 06/28/2018  with normal TSH dont strongly suspect thyroid is cause of loose stools

## 2018-07-09 NOTE — Progress Notes (Signed)
Subjective:  Timothy Gallagher is a 82 y.o. year old very pleasant male patient who presents for/with See problem oriented charting ROS- no nausea, vomiting, abdominal pain. Does have sudden bowel movements with losoe stool. No fever or chills.    Past Medical History-  Patient Active Problem List   Diagnosis Date Noted  . Hyperthyroidism 01/13/2018    Priority: High  . Detached retina, left 06/30/2017    Priority: High  . AAA (abdominal aortic aneurysm) ([place]) 05/12/2017    Priority: High  . CAD (coronary artery disease) of artery bypass graft 05/19/2014    Priority: High  . BPPV (benign paroxysmal positional vertigo) 09/01/2016    Priority: Medium  . Insomnia 11/22/2014    Priority: Medium  . Anemia 07/20/2014    Priority: Medium  . BPH (benign prostatic hyperplasia) 12/05/2013    Priority: Medium  . Hyperlipidemia 03/13/2008    Priority: Medium  . B12 deficiency 06/07/2007    Priority: Medium  . Essential hypertension 04/09/2007    Priority: Medium  . Left sided abdominal pain 04/15/2017    Priority: Low  . Low back pain 02/06/2017    Priority: Low  . Anal fissure 04/07/2016    Priority: Low  . Internal and external bleeding hemorrhoids 01/22/2015    Priority: Low  . Palpitations 02/11/2013    Priority: Low  . OSTEOARTHRITIS, WRIST, RIGHT 08/05/2010    Priority: Low  . ACNE ROSACEA 06/27/2009    Priority: Low  . ESOPHAGEAL STRICTURE 04/27/2009    Priority: Low  . ACTINIC KERATOSIS, HEAD 04/18/2009    Priority: Low  . NECK PAIN 09/14/2008    Priority: Low  . NEUROPATHY, IDIOPATHIC PERIPHERAL NEC 08/11/2007    Priority: Low  . Irritable bowel syndrome 08/11/2007    Priority: Low  . ALLERGIC RHINITIS 04/09/2007    Priority: Low  . Chest pain 03/06/2018    Medications- reviewed and updated Current Outpatient Medications  Medication Sig Dispense Refill  . acetaminophen (TYLENOL) 500 MG tablet Take 500 mg by mouth daily as needed (for pain). Reported on  04/11/2016    . aspirin 81 MG EC tablet Take 1 tablet (81 mg total) by mouth daily. (Patient taking differently: Take 81 mg by mouth every evening. )    . fluticasone (FLONASE) 50 MCG/ACT nasal spray SHAKE LIQUID AND USE 1 TO 2 SPRAYS IN EACH NOSTRIL DAILY AS NEEDED FOR ALLERGIES 16 g 5  . methimazole (TAPAZOLE) 10 MG tablet Take 1 tablet (10 mg total) by mouth daily. 30 tablet 3  . Multiple Vitamins-Minerals (CENTRUM SILVER PO) Take 1 tablet by mouth daily.    . tamsulosin (FLOMAX) 0.4 MG CAPS capsule Take 0.4 mg by mouth daily after supper.   3  . zolpidem (AMBIEN) 5 MG tablet TAKE 1 TABLET BY MOUTH AT BEDTIME AS NEEDED FOR SLEEP 30 tablet 5  . atorvastatin (LIPITOR) 20 MG tablet Take 1 tablet (20 mg total) by mouth daily. 90 tablet 3   No current facility-administered medications for this visit.     Objective: BP 130/80 (BP Location: Left Arm, Patient Position: Sitting, Cuff Size: Normal)   Pulse 70   Temp 98.3 F (36.8 C) (Oral)   Ht 6\' 1"  (1.854 m)   Wt 189 lb 12.8 oz (86.1 kg)   BMI 25.04 kg/m  Gen: NAD, resting comfortably, appears well CV: RRR no murmurs rubs or gallops Lungs: CTAB no crackles, wheeze, rhonchi Abdomen: soft/nontender/nondistended/normal bowel sounds.  Ext: no edema Skin: warm, dry  Assessment/Plan:  Loose bowel movements/red stool S: patient complains of abnormal bowel movements for 3 weeks. Having some liquid and then small chunks. Has seen some red mixed in but does have some red popsicles as well ( no melena or obvious bright red blood Has BM everyday up to 3-4 x a night. 4-5 total for the day. Not a bad odor to it. No unintentional weight loss.  A/P: up to date from 2011 on colonoscopy- doubt malignancy as cause of bowel change. dont see diarrhea as side effect on [email or web address] for methimazole. from avs  "This could be due to the antibiotics you were on within 6 weeks- it may have cleared out the good bacteria in your gut and caused your bowel issues. Lets  try either align probiotic or activia yogurt daily to see if we can get some of the good bacteria back.   If you are not improving in 2-3 weeks OR if you worsen- let me know as I would want to collect some of your stool and test it.   Thyroid can mes up bowels but your last numbers looked so good I dont think that's the cause   I do want you to do some stool cards to make sure no blood in thes tool- pick these up from lab before you leave"  B12 deficiency S:  b12 has been adequately repleted. He continues on monthly injections to keep level up Lab Results  Component Value Date   [id number] 098 04/13/2018  A/P: gave b12 shot today- continue monthly injections  Essential hypertension S: controlled on no rx today. Has had lightheadedness issues on metoprolol even low dose and fatigue and lightheadedness on telmisartan BP Readings from Last 3 Encounters:  07/09/18 130/80  06/28/18 (!) 149/82 at endocrine  06/14/18 138/72  A/P: We discussed blood pressure goal of <140/90. Continue without meds  Hyperthyroidism Lab Results  Component Value Date   TSH 0.98 06/28/2018  with normal TSH dont strongly suspect thyroid is cause of loose stools  Future Appointments  Date Time Provider [place]  08/17/2018  9:00 AM Renato Shin, MD [facility] None  08/31/2018  9:00 AM Renato Shin, MD [facility] None  09/27/2018  8:45 AM [name], MD [facility]  06/21/2019 11:00 AM [facility] HEALTH COACH [facility]   Lab/Order associations: Vitamin B12 deficiency - Plan: cyanocobalamin ((VITAMIN B-12)) injection 1,000 mcg, DISCONTINUED: cyanocobalamin ((VITAMIN B-12)) injection 1,000 mcg  Loose bowel movement  Red stool - Plan: Fecal occult blood, imunochemical  B12 deficiency  Essential hypertension  Hyperthyroidism  Meds ordered this encounter  Medications  . DISCONTD: cyanocobalamin ((VITAMIN B-12)) injection 1,000 mcg  . cyanocobalamin ((VITAMIN B-12)) injection 1,000  mcg   Return precautions advised.  [name], MD

## 2018-07-09 NOTE — Patient Instructions (Addendum)
Health Maintenance Due  Topic Date Due  . INFLUENZA VACCINE - Please schedule in Fall 06/17/2018   . Please check with your pharmacy to see if they have the shingrix vaccine. If they do- please get this immunization and update [place] by phone call or mychart with dates you receive the vaccine.    This could be due to the antibiotics you were on within 6 weeks- it may have cleared out the good bacteria in your gut and caused your bowel issues. Lets try either align probiotic or activia yogurt daily to see if we can get some of the good bacteria back.   If you are not improving in 2-3 weeks OR if you worsen- let me know as I would want to collect some of your stool and test it.   Thyroid can mes up bowels but your last numbers looked so good I dont think that's the cause   I do want you to do some stool cards to make sure no blood in thes tool- pick these up from lab before you leave

## 2018-07-09 NOTE — Assessment & Plan Note (Signed)
S:  b12 has been adequately repleted. He continues on monthly injections to keep level up Lab Results  Component Value Date   VITAMINB12 427 04/13/2018  A/P: gave b12 shot today- continue monthly injections

## 2018-07-09 NOTE — Assessment & Plan Note (Signed)
S: controlled on no rx today. Has had lightheadedness issues on metoprolol even low dose and fatigue and lightheadedness on telmisartan BP Readings from Last 3 Encounters:  07/09/18 130/80  06/28/18 (!) 149/82 at endocrine  06/14/18 138/72  A/P: We discussed blood pressure goal of <140/90. Continue without meds

## 2018-07-12 ENCOUNTER — Telehealth: Payer: Self-pay | Admitting: Family Medicine

## 2018-07-12 NOTE — Telephone Encounter (Signed)
See note.   Copied from [place] [phone]. Topic: Inquiry >> Jul 12, 2018  8:04 AM Margot Ables wrote: Reason for CRM: pt states he messed up the stool sample kit and needs to come in to pick up another one. It won't be today but in the next few days. Can one be at the front for him to pick up?

## 2018-07-12 NOTE — Telephone Encounter (Signed)
Marcene Brawn, when pt comes he just needs to go to the lab they have the supplies. Thanks.

## 2018-07-15 ENCOUNTER — Ambulatory Visit: Payer: Self-pay | Admitting: Family Medicine

## 2018-07-15 ENCOUNTER — Ambulatory Visit: Payer: Medicare Other | Admitting: Family Medicine

## 2018-07-15 ENCOUNTER — Telehealth: Payer: Self-pay | Admitting: Internal Medicine

## 2018-07-15 ENCOUNTER — Other Ambulatory Visit: Payer: Medicare Other

## 2018-07-15 NOTE — Telephone Encounter (Signed)
Attempted to return call.  No answer/machine 

## 2018-07-15 NOTE — Telephone Encounter (Signed)
Noted  

## 2018-07-15 NOTE — Telephone Encounter (Signed)
See note

## 2018-07-15 NOTE — Telephone Encounter (Signed)
Patient is calling to report he has seen blood when he wipes for 2 weeks now. Pink/red on tissue. He has had accompanying diarrhea and he does have a history of hemorrhoids.Appointment scheduled for evaluation of rectal bleeding.  Reason for Disposition . MILD rectal bleeding (more than just a few drops or streaks)  Answer Assessment - Initial Assessment Questions 1. APPEARANCE of BLOOD: "What color is it?" "Is it passed separately, on the surface of the stool, or mixed in with the stool?"      Pink/red, just sees stain when wipes 2. AMOUNT: "How much blood was passed?"      Tissue only 3. FREQUENCY: "How many times has blood been passed with the stools?"      every time wipes 4. ONSET: "When was the blood first seen in the stools?" (Days or weeks)      2 weeks 5. DIARRHEA: "Is there also some diarrhea?" If so, ask: "How many diarrhea stools were passed in past 24 hours?"      Loose stools- 5 times/am- watery 6. CONSTIPATION: "Do you have constipation?" If so, "How bad is it?"     no 7. RECURRENT SYMPTOMS: "Have you had blood in your stools before?" If so, ask: "When was the last time?" and "What happened that time?"      no 8. BLOOD THINNERS: "Do you take any blood thinners?" (e.g., Coumadin/warfarin, Pradaxa/dabigatran, aspirin)     ASA daily 9. OTHER SYMPTOMS: "Do you have any other symptoms?"  (e.g., abdominal pain, vomiting, dizziness, fever)     Abdominal discomfort- 2 weeks 10. PREGNANCY: "Is there any chance you are pregnant?" "When was your last menstrual period?"       n/a  Protocols used: RECTAL BLEEDING-A-AH

## 2018-07-16 ENCOUNTER — Encounter: Payer: Self-pay | Admitting: Family Medicine

## 2018-07-16 ENCOUNTER — Ambulatory Visit: Payer: Medicare Other | Admitting: Family Medicine

## 2018-07-16 ENCOUNTER — Other Ambulatory Visit (INDEPENDENT_AMBULATORY_CARE_PROVIDER_SITE_OTHER): Payer: Medicare Other

## 2018-07-16 VITALS — BP 136/82 | HR 83 | Temp 98.6°F | Ht 73.0 in | Wt 187.0 lb

## 2018-07-16 DIAGNOSIS — R195 Other fecal abnormalities: Secondary | ICD-10-CM

## 2018-07-16 LAB — CBC
HCT: 40 % (ref 39.0–52.0)
Hemoglobin: 13 g/dL (ref 13.0–17.0)
MCHC: 32.6 g/dL (ref 30.0–36.0)
MCV: 81.5 fl (ref 78.0–100.0)
Platelets: 209 10*3/uL (ref 150.0–400.0)
RBC: 4.91 Mil/uL (ref 4.22–5.81)
RDW: 15.8 % — ABNORMAL HIGH (ref 11.5–15.5)
WBC: 6.5 10*3/uL (ref 4.0–10.5)

## 2018-07-16 LAB — COMPREHENSIVE METABOLIC PANEL
ALT: 12 U/L (ref 0–53)
AST: 15 U/L (ref 0–37)
Albumin: 3.7 g/dL (ref 3.5–5.2)
Alkaline Phosphatase: 97 U/L (ref 39–117)
BUN: 13 mg/dL (ref 6–23)
CO2: 27 mEq/L (ref 19–32)
Calcium: 9 mg/dL (ref 8.4–10.5)
Chloride: 104 mEq/L (ref 96–112)
Creatinine, Ser: 1.05 mg/dL (ref 0.40–1.50)
GFR: 72.22 mL/min (ref >60.00)
Glucose, Bld: 111 mg/dL — ABNORMAL HIGH (ref 70–99)
Potassium: 4.7 mEq/L (ref 3.5–5.1)
Sodium: 139 mEq/L (ref 135–145)
Total Bilirubin: 0.6 mg/dL (ref 0.2–1.2)
Total Protein: 6.7 g/dL (ref 6.0–8.3)

## 2018-07-16 LAB — FECAL OCCULT BLOOD, IMMUNOCHEMICAL: Fecal Occult Bld: POSITIVE — AB

## 2018-07-16 NOTE — Progress Notes (Signed)
Subjective:  Timothy Gallagher is a 82 y.o. year old very pleasant male patient who presents for/with See problem oriented charting ROS- some dizziness, no fever or chills. No chest pain. Pinkish discoloration of stool with some mucus.    Past Medical History-  Patient Active Problem List   Diagnosis Date Noted  . Hyperthyroidism 01/13/2018    Priority: High  . Detached retina, left 06/30/2017    Priority: High  . AAA (abdominal aortic aneurysm) ([place]) 05/12/2017    Priority: High  . CAD (coronary artery disease) of artery bypass graft 05/19/2014    Priority: High  . BPPV (benign paroxysmal positional vertigo) 09/01/2016    Priority: Medium  . Insomnia 11/22/2014    Priority: Medium  . Anemia 07/20/2014    Priority: Medium  . BPH (benign prostatic hyperplasia) 12/05/2013    Priority: Medium  . Hyperlipidemia 03/13/2008    Priority: Medium  . B12 deficiency 06/07/2007    Priority: Medium  . Essential hypertension 04/09/2007    Priority: Medium  . Left sided abdominal pain 04/15/2017    Priority: Low  . Low back pain 02/06/2017    Priority: Low  . Anal fissure 04/07/2016    Priority: Low  . Internal and external bleeding hemorrhoids 01/22/2015    Priority: Low  . Palpitations 02/11/2013    Priority: Low  . OSTEOARTHRITIS, WRIST, RIGHT 08/05/2010    Priority: Low  . ACNE ROSACEA 06/27/2009    Priority: Low  . ESOPHAGEAL STRICTURE 04/27/2009    Priority: Low  . ACTINIC KERATOSIS, HEAD 04/18/2009    Priority: Low  . NECK PAIN 09/14/2008    Priority: Low  . NEUROPATHY, IDIOPATHIC PERIPHERAL NEC 08/11/2007    Priority: Low  . Irritable bowel syndrome 08/11/2007    Priority: Low  . ALLERGIC RHINITIS 04/09/2007    Priority: Low  . Chest pain 03/06/2018    Medications- reviewed and updated Current Outpatient Medications  Medication Sig Dispense Refill  . acetaminophen (TYLENOL) 500 MG tablet Take 500 mg by mouth daily as needed (for pain). Reported on 04/11/2016    .  aspirin 81 MG EC tablet Take 1 tablet (81 mg total) by mouth daily. (Patient taking differently: Take 81 mg by mouth every evening. )    . atorvastatin (LIPITOR) 20 MG tablet Take 1 tablet (20 mg total) by mouth daily. 90 tablet 3  . fluticasone (FLONASE) 50 MCG/ACT nasal spray SHAKE LIQUID AND USE 1 TO 2 SPRAYS IN EACH NOSTRIL DAILY AS NEEDED FOR ALLERGIES 16 g 5  . methimazole (TAPAZOLE) 10 MG tablet Take 1 tablet (10 mg total) by mouth daily. 30 tablet 3  . Multiple Vitamins-Minerals (CENTRUM SILVER PO) Take 1 tablet by mouth daily.    . tamsulosin (FLOMAX) 0.4 MG CAPS capsule Take 0.4 mg by mouth daily after supper.   3  . zolpidem (AMBIEN) 5 MG tablet TAKE 1 TABLET BY MOUTH AT BEDTIME AS NEEDED FOR SLEEP 30 tablet 5   No current facility-administered medications for this visit.     Objective: BP 136/82   Pulse 83   Temp 98.6 F (37 C) (Oral)   Ht 6\' 1"  (1.854 m)   Wt 187 lb (84.8 kg)   SpO2 96%   BMI 24.67 kg/m  Gen: NAD, resting comfortably CV: RRR no murmurs rubs or gallops Lungs: CTAB no crackles, wheeze, rhonchi Abdomen: soft/nontender/nondistended/normal bowel sounds. No rebound or guarding.  Ext: no edema Skin: warm, dry Rectal: no external hemorrhoids, internal exam done but  no stool on fingertip so did not do FOBT- no obvious internal hemorrhoids or discomfort on exam   Assessment/Plan:  Loose stools - Plan: C. difficile GDH and Toxin A/B, Fecal lactoferrin, quant, Stool culture, CBC, Comprehensive metabolic panel S: up multiple times last night- gets small ball of stool and a little bit of liquid. No abdominal pain. No fever or chills. Mild stomach discomfort- pepto helped or having bowel movement helps.. Pink and slimy looking at end of the stool. Not throwing up. At least 5 movements at day at least. Some red/pinkish on toilet tissue when he wipes- he has stopped his red posicles  Has been using align yogurt and no help so far. Half teaspoon of pepto calmed stomach  this stomach A/P: Diarrhea continued- possibly infectious. Positive for blood- easily could be due to internal hemorrhoids which he has a history of- though no external hemorrhoids noted on exam. Diarrhea not listed as side effect of metimazole and his thyroid levels have been ok. We will rule out infectious causes with stool sample. Luckily cbc, cmp largely reassuring today  Lightheaded with this- not eating or drinking particularly well- encouraged good hydration alternating gatorade and water . No longer on any BP medicines we can hold.   Probiotics have not been helpful- he can use pepto bismol prn as he has found helpful   Future Appointments  Date Time Provider [place]  08/10/2018  9:00 AM [facility] NURSE [facility]  08/17/2018  9:00 AM Renato Shin, MD [facility] None  08/31/2018  9:00 AM Renato Shin, MD [facility] None  09/01/2018  2:45 PM Gatha Mayer, MD [facility]  09/27/2018  8:45 AM [name], MD [facility]  06/21/2019 11:00 AM [facility] HEALTH COACH [facility]   Lab/Order associations: Loose stools - Plan: C. difficile GDH and Toxin A/B, Fecal lactoferrin, quant, Stool culture, CBC, Comprehensive metabolic panel  Return precautions advised.  [name], MD

## 2018-07-16 NOTE — Patient Instructions (Addendum)
Please stop by the lab for stool collection kit. Would be great if you could bring back today- might have results by mid next week. If your symptoms continue into next week please bring in on Tuesday if you cant get sample by here today.   Lets make sure you are not getting anemic for possible blood loss and also check kidney/liver/electrolytes  Please stop by lab before you go  

## 2018-07-20 ENCOUNTER — Other Ambulatory Visit: Payer: Medicare Other

## 2018-07-20 DIAGNOSIS — R195 Other fecal abnormalities: Secondary | ICD-10-CM

## 2018-07-20 NOTE — Telephone Encounter (Signed)
Patient was asking for an earlier appt.  He is offered an appt with APP.  He declines.  He wants to keep the appt with Dr. [name] on 10/16.  He will call back if he changes his mind

## 2018-07-21 MED ORDER — VANCOMYCIN HCL 250 MG PO CAPS: 250.0000 mg | ORAL_CAPSULE | Freq: Four times a day (QID) | ORAL | 0 refills | Status: DC

## 2018-07-21 NOTE — Addendum Note (Signed)
Addended by: [name] on: 07/21/2018 04:04 PM   Modules accepted: Orders

## 2018-07-22 ENCOUNTER — Telehealth: Payer: Self-pay | Admitting: Family Medicine

## 2018-07-22 NOTE — Telephone Encounter (Signed)
Copied from [place] [id number]. Topic: Quick Communication - See Telephone Encounter >> Jul 22, 2018  4:44 PM Blase Mess A wrote: CRM for notification. See Telephone encounter for: 07/22/18. Patient's wife called and stated that insurance will not pay for vancomycin (VANCOCIN) 250 MG capsule [[id number]]   it is going to be $2,500.  Can the doctor call some other medication in?  Please advise. Patients call back number [phone] (H)

## 2018-07-23 ENCOUNTER — Other Ambulatory Visit: Payer: Self-pay

## 2018-07-23 MED ORDER — METRONIDAZOLE 500 MG PO TABS: 500.0000 mg | ORAL_TABLET | Freq: Three times a day (TID) | ORAL | 0 refills | Status: DC

## 2018-07-23 NOTE — Telephone Encounter (Signed)
Prior Authorization submitted for zolpidem (AMBIEN) 5 MG tablet. Pending approval at this time

## 2018-07-23 NOTE — Telephone Encounter (Signed)
I sent in an alternate- not generally as effective but should be much cheaper and hope it clears issue for patient.

## 2018-07-23 NOTE — Addendum Note (Signed)
Addended by: [name] on: 07/23/2018 10:15 PM   Modules accepted: Orders

## 2018-07-23 NOTE — Telephone Encounter (Signed)
Please advise on change of medication

## 2018-07-24 ENCOUNTER — Other Ambulatory Visit: Payer: Self-pay | Admitting: Family Medicine

## 2018-07-24 LAB — FECAL LACTOFERRIN, QUANT
Fecal Lactoferrin: POSITIVE — AB
MICRO NUMBER:: 91049486
SPECIMEN QUALITY:: ADEQUATE

## 2018-07-24 LAB — STOOL CULTURE
MICRO NUMBER:: 91049483
MICRO NUMBER:: 91049485
MICRO NUMBER:: 91049487
SHIGA RESULT:: NOT DETECTED
SPECIMEN QUALITY:: ADEQUATE
SPECIMEN QUALITY:: ADEQUATE
SPECIMEN QUALITY:: ADEQUATE

## 2018-07-24 LAB — C. DIFFICILE GDH AND TOXIN A/B
GDH ANTIGEN: DETECTED
MICRO NUMBER:: 91049484
SPECIMEN QUALITY:: ADEQUATE
TOXIN A AND B: DETECTED

## 2018-07-24 MED ORDER — METRONIDAZOLE 500 MG PO TABS: 500.0000 mg | ORAL_TABLET | Freq: Three times a day (TID) | ORAL | 0 refills | Status: AC

## 2018-07-24 NOTE — Progress Notes (Signed)
[name] called because he did not have Rx at his pharmacy. According to Amy,nurse on call, he was supposed to get an abx to replaced Vancomycin because he could not afford Rx. He is having diarrhea,small amounts. I see that Metronidazole was sent on 07/23/18, I re-sent Rx with same instructions.  Timothy Tootle [place], MD

## 2018-07-26 NOTE — Telephone Encounter (Signed)
Called and confirmed that patient has picked up his prescription and started it.

## 2018-07-27 DIAGNOSIS — Z961 Presence of intraocular lens: Secondary | ICD-10-CM | POA: Diagnosis not present

## 2018-07-27 DIAGNOSIS — H3522 Other non-diabetic proliferative retinopathy, left eye: Secondary | ICD-10-CM | POA: Diagnosis not present

## 2018-07-27 DIAGNOSIS — H33052 Total retinal detachment, left eye: Secondary | ICD-10-CM | POA: Diagnosis not present

## 2018-07-28 ENCOUNTER — Ambulatory Visit: Payer: Self-pay | Admitting: Family Medicine

## 2018-07-28 NOTE — Telephone Encounter (Signed)
Called patient who called with c/o dizziness and weakness for several days Pt states he thinks it is caused by Flagyl that he is taking for C Diff. Pt states that the weakness  lessens between doses. Pt states weakness keeps him from normal activities. He say that is BM's are formed now. He is drinking Nurse, mental health and urinating adequately. He denies bloody stools. He denies CP weakness on one side or any other complaints. Care advice given per protocol.   Reason for Disposition . [1] MODERATE weakness (i.e., interferes with work, school, normal activities) AND [2] persists > 3 days  Answer Assessment - Initial Assessment Questions 1. DESCRIPTION: "Describe how you are feeling."     Weakness, woozy headed 2. SEVERITY: "How bad is it?"  "Can you stand and walk?"   - MILD - Feels weak or tired, but does not interfere with work, school or normal activities   - [place] to stand and walk; weakness interferes with work, school, or normal activities   - SEVERE - Unable to stand or walk     moderate 3. ONSET:  "When did the weakness begin?"     1 week 4. CAUSE: "What do you think is causing the weakness?"     unknown 5. MEDICINES: "Have you recently started a new medicine or had a change in the amount of a medicine?"     Flagyl  6. OTHER SYMPTOMS: "Do you have any other symptoms?" (e.g., chest pain, fever, cough, SOB, vomiting, diarrhea, bleeding, other areas of pain)     no  Protocols used: WEAKNESS (GENERALIZED) AND FATIGUE-A-AH

## 2018-07-28 NOTE — Telephone Encounter (Signed)
Appointment tomorrow is reasonable- hoping he can continue the medicine due to the c diff

## 2018-07-29 ENCOUNTER — Ambulatory Visit: Payer: Medicare Other | Admitting: Family Medicine

## 2018-07-29 ENCOUNTER — Encounter: Payer: Self-pay | Admitting: Family Medicine

## 2018-07-29 VITALS — BP 122/70 | HR 80 | Temp 97.7°F | Ht 73.0 in | Wt 187.4 lb

## 2018-07-29 DIAGNOSIS — A0472 Enterocolitis due to Clostridium difficile, not specified as recurrent: Secondary | ICD-10-CM | POA: Diagnosis not present

## 2018-07-29 DIAGNOSIS — R42 Dizziness and giddiness: Secondary | ICD-10-CM | POA: Diagnosis not present

## 2018-07-29 NOTE — Patient Instructions (Addendum)
I do think this could be a side effect of the metronidazole/flagyl  We could stop this and put you on the super expensive medicine but you preferred to just finish it and see if you start to feel better a few days after that  If you are not feeling better- then please see [place] back sometime next week  Please let our nurses know how you are doing on the 24th

## 2018-07-29 NOTE — Progress Notes (Signed)
Subjective:  Timothy Gallagher is a 82 y.o. year old very pleasant male patient who presents for/with See problem oriented charting ROS- no chest pain or shortness of breath. Dizziness but no headaches. Feels weak with walking and just diffusely   Past Medical History-  Patient Active Problem List   Diagnosis Date Noted  . Hyperthyroidism 01/13/2018    Priority: High  . Detached retina, left 06/30/2017    Priority: High  . AAA (abdominal aortic aneurysm) ([place]) 05/12/2017    Priority: High  . CAD (coronary artery disease) of artery bypass graft 05/19/2014    Priority: High  . BPPV (benign paroxysmal positional vertigo) 09/01/2016    Priority: Medium  . Insomnia 11/22/2014    Priority: Medium  . Anemia 07/20/2014    Priority: Medium  . BPH (benign prostatic hyperplasia) 12/05/2013    Priority: Medium  . Hyperlipidemia 03/13/2008    Priority: Medium  . B12 deficiency 06/07/2007    Priority: Medium  . Essential hypertension 04/09/2007    Priority: Medium  . Left sided abdominal pain 04/15/2017    Priority: Low  . Low back pain 02/06/2017    Priority: Low  . Anal fissure 04/07/2016    Priority: Low  . Internal and external bleeding hemorrhoids 01/22/2015    Priority: Low  . Palpitations 02/11/2013    Priority: Low  . OSTEOARTHRITIS, WRIST, RIGHT 08/05/2010    Priority: Low  . ACNE ROSACEA 06/27/2009    Priority: Low  . ESOPHAGEAL STRICTURE 04/27/2009    Priority: Low  . ACTINIC KERATOSIS, HEAD 04/18/2009    Priority: Low  . NECK PAIN 09/14/2008    Priority: Low  . NEUROPATHY, IDIOPATHIC PERIPHERAL NEC 08/11/2007    Priority: Low  . Irritable bowel syndrome 08/11/2007    Priority: Low  . ALLERGIC RHINITIS 04/09/2007    Priority: Low  . Chest pain 03/06/2018    Medications- reviewed and updated Current Outpatient Medications  Medication Sig Dispense Refill  . acetaminophen (TYLENOL) 500 MG tablet Take 500 mg by mouth daily as needed (for pain). Reported on  04/11/2016    . aspirin 81 MG EC tablet Take 1 tablet (81 mg total) by mouth daily. (Patient taking differently: Take 81 mg by mouth every evening. )    . fluticasone (FLONASE) 50 MCG/ACT nasal spray SHAKE LIQUID AND USE 1 TO 2 SPRAYS IN EACH NOSTRIL DAILY AS NEEDED FOR ALLERGIES 16 g 5  . methimazole (TAPAZOLE) 10 MG tablet Take 1 tablet (10 mg total) by mouth daily. 30 tablet 3  . metroNIDAZOLE (FLAGYL) 500 MG tablet Take 1 tablet (500 mg total) by mouth 3 (three) times daily for 10 days. 30 tablet 0  . Multiple Vitamins-Minerals (CENTRUM SILVER PO) Take 1 tablet by mouth daily.    . tamsulosin (FLOMAX) 0.4 MG CAPS capsule Take 0.4 mg by mouth daily after supper.   3  . zolpidem (AMBIEN) 5 MG tablet TAKE 1 TABLET BY MOUTH AT BEDTIME AS NEEDED FOR SLEEP 30 tablet 5  . atorvastatin (LIPITOR) 20 MG tablet Take 1 tablet (20 mg total) by mouth daily. 90 tablet 3   No current facility-administered medications for this visit.     Objective: BP 122/70 (BP Location: Left Arm, Patient Position: Sitting, Cuff Size: Normal)   Pulse 80   Temp 97.7 F (36.5 C) (Oral)   Ht 6\' 1"  (1.854 m)   Wt 187 lb 6.1 oz (85 kg)   SpO2 97%   BMI 24.72 kg/m  Gen: NAD,  resting comfortably CV: RRR no murmurs rubs or gallops Lungs: CTAB no crackles, wheeze, rhonchi Abdomen: soft/nontender/nondistended/normal bowel sounds Ext: no edema Skin: warm, dry Neuro: 5/5 strength in upper and lower extremities  Low back without midline tenderness- does have pain with palpation of Paraspinous muscles mildly.  Assessment/Plan:  C. difficile diarrhea  Lightheadedness S: Patient started on flagyl 5-6 days ago for c diff colitis. He was unable to afford the vancomycin. Since starting medicine within a day or two it made him have a feeling of diffuse mild muscle weakness. He states intermittent lightheadedness and states can happen at anytime but goes away- today when examining him only occurred with standing. Denies vertigo.  Some mild myalgias including in low back- hot showers help.   Luckily diarrhea has resolved A/P: I encouraged patient to complete full course of flagyl to avoid recurrence of C. Diff diarrhea- he agrees to finish. He thought the lightheadedness and weakness was coming from the medicine but just wanted to be sure. I did encourage him to push to stay well hydrated as symptoms sound orthostatic- he agrees to work on this. If he has new or worsening symptoms or symptoms that fail to resolve when he finishes the medicine next week he should return to care  I offered labs including CBC, cmp, CK (though doubt rhabdomyolysis) - he declines for now but agrees to do these if symptoms fail to improve  We discussed if symptoms of diarrhea remain resolved that he could cancel GI appoitnment he made for about a month out  Future Appointments  Date Time Provider [place]  08/10/2018  9:00 AM [facility] NURSE [facility]  08/17/2018  9:00 AM Renato Shin, MD [facility] None  08/31/2018  9:00 AM Renato Shin, MD [facility] None  09/01/2018  2:45 PM Gatha Mayer, MD [facility]  09/27/2018  8:45 AM [name], MD [facility]  06/21/2019 11:00 AM [facility] HEALTH COACH [facility]   Return precautions advised.  [name], MD

## 2018-07-30 ENCOUNTER — Telehealth: Payer: Self-pay | Admitting: Interventional Cardiology

## 2018-07-30 NOTE — Telephone Encounter (Signed)
° °  Pt c/o BP issue: STAT if pt c/o blurred vision, one-sided weakness or slurred speech  1. What are your last 5 BP readings? 101/64, 82/56, 113/65, 94/55  2. Are you having any other symptoms (ex. Dizziness, headache, blurred vision, passed out)? Dizziness, weakness  3. What is your BP issue? Patient concerned BP too low

## 2018-07-30 NOTE — Telephone Encounter (Signed)
Spoke with pt and he states all of these BPs are from today and he is dizzy and weak.  Pt currently with Cdiff.  States sx didn't start until he started the Flagyl.  Pt seen PCP yesterday and PCP told him the Flagyl can cause these sx.  Advised pt to increase fluids to help keep BP up.  Advised if sx worsen to contact PCP since sx didn't start until he started the Flagyl.  Pt very appreciative for call.

## 2018-08-03 ENCOUNTER — Telehealth: Payer: Self-pay

## 2018-08-03 NOTE — Telephone Encounter (Signed)
Prior authorization submitted for zolpidem (AMBIEN) 5 MG tablet. Pending at this time

## 2018-08-10 ENCOUNTER — Ambulatory Visit (INDEPENDENT_AMBULATORY_CARE_PROVIDER_SITE_OTHER): Payer: Medicare Other

## 2018-08-10 DIAGNOSIS — E538 Deficiency of other specified B group vitamins: Secondary | ICD-10-CM | POA: Diagnosis not present

## 2018-08-10 MED ORDER — CYANOCOBALAMIN 1000 MCG/ML IJ SOLN
1000.0000 ug | Freq: Once | INTRAMUSCULAR | Status: AC
  Administered 2018-08-10: 1000 ug via INTRAMUSCULAR

## 2018-08-10 NOTE — Patient Instructions (Signed)
There are no preventive care reminders to display for this patient.  Depression screen PHQ 2/9 06/14/2018 04/13/2018 04/07/2017  Decreased Interest 0 0 0  Down, Depressed, Hopeless 0 0 0  PHQ - 2 Score 0 0 0  Some recent data might be hidden   

## 2018-08-10 NOTE — Progress Notes (Signed)
Patient in today for a B12 injection due to B12 deficiency. Administered in right arm. Patient tolerated well.

## 2018-08-13 ENCOUNTER — Encounter: Payer: Self-pay | Admitting: Family Medicine

## 2018-08-13 ENCOUNTER — Ambulatory Visit (INDEPENDENT_AMBULATORY_CARE_PROVIDER_SITE_OTHER): Payer: Medicare Other

## 2018-08-13 DIAGNOSIS — Z23 Encounter for immunization: Secondary | ICD-10-CM

## 2018-08-17 ENCOUNTER — Telehealth: Payer: Self-pay | Admitting: Family Medicine

## 2018-08-17 ENCOUNTER — Ambulatory Visit: Payer: Medicare Other | Admitting: Endocrinology

## 2018-08-17 DIAGNOSIS — Z0289 Encounter for other administrative examinations: Secondary | ICD-10-CM

## 2018-08-17 NOTE — Telephone Encounter (Signed)
Patient called after hours on 9/29 stating he had diarrhea and has been given medication for it but he has run out and it has gotten worse. He had an accident on Saturday where "it just came out." Patient stated he would drink Gatorade and see if he can be worked into Dr. Ansel Bong schedule.   Contact patient to discuss

## 2018-08-19 NOTE — Telephone Encounter (Signed)
Called and spoke to patient. He states he has had intermittent diarrhea since the weekend. He states he had diarrhea once yesterday and once today, took imodium both times which helped. Patient states his diarrhea had completely resolved but now is intermittent but not as bad as it was. Patient has an eye procedure scheduled for Monday and a follow up Tuesday so he will not be able to come in those days. He states he is not sure if he should come in or if it is ok to wait until November. Dr Yong Channel, please advise. GI referral was cancelled because diarrhea had subsided.

## 2018-08-19 NOTE — Telephone Encounter (Signed)
Ask him to give me an update next week. With history of C. Diff- if this persists may want to see him in to retest. Encourage him to remain well hydrated and make sure no blood in stool or melena.

## 2018-08-20 NOTE — Telephone Encounter (Signed)
Called and spoke with patient. He did wish to make an appt with Dr Yong Channel. Due to his schedule and Dr Ronney Lion schedule his appt is 08/30/18. I explained to patient that if he starts getting worsening symptoms and feels like he needs to come in sooner to give office a call and he can see another provider. Patient stated understanding. He states the diarrhea has not gotten worse but he has been having cramps.

## 2018-08-23 DIAGNOSIS — Z79899 Other long term (current) drug therapy: Secondary | ICD-10-CM | POA: Diagnosis not present

## 2018-08-23 DIAGNOSIS — Z87891 Personal history of nicotine dependence: Secondary | ICD-10-CM | POA: Diagnosis not present

## 2018-08-23 DIAGNOSIS — Z951 Presence of aortocoronary bypass graft: Secondary | ICD-10-CM | POA: Diagnosis not present

## 2018-08-23 DIAGNOSIS — Z87440 Personal history of urinary (tract) infections: Secondary | ICD-10-CM | POA: Diagnosis not present

## 2018-08-23 DIAGNOSIS — Z87442 Personal history of urinary calculi: Secondary | ICD-10-CM | POA: Diagnosis not present

## 2018-08-23 DIAGNOSIS — E0590 Thyrotoxicosis, unspecified without thyrotoxic crisis or storm: Secondary | ICD-10-CM | POA: Diagnosis not present

## 2018-08-23 DIAGNOSIS — N400 Enlarged prostate without lower urinary tract symptoms: Secondary | ICD-10-CM | POA: Diagnosis not present

## 2018-08-23 DIAGNOSIS — Z9842 Cataract extraction status, left eye: Secondary | ICD-10-CM | POA: Diagnosis not present

## 2018-08-23 DIAGNOSIS — Z7982 Long term (current) use of aspirin: Secondary | ICD-10-CM | POA: Diagnosis not present

## 2018-08-23 DIAGNOSIS — Z961 Presence of intraocular lens: Secondary | ICD-10-CM | POA: Diagnosis not present

## 2018-08-23 DIAGNOSIS — H33022 Retinal detachment with multiple breaks, left eye: Secondary | ICD-10-CM | POA: Diagnosis not present

## 2018-08-23 DIAGNOSIS — Z9841 Cataract extraction status, right eye: Secondary | ICD-10-CM | POA: Diagnosis not present

## 2018-08-23 DIAGNOSIS — I2510 Atherosclerotic heart disease of native coronary artery without angina pectoris: Secondary | ICD-10-CM | POA: Diagnosis not present

## 2018-08-30 ENCOUNTER — Ambulatory Visit (INDEPENDENT_AMBULATORY_CARE_PROVIDER_SITE_OTHER): Payer: Medicare Other | Admitting: Family Medicine

## 2018-08-30 ENCOUNTER — Encounter: Payer: Self-pay | Admitting: Family Medicine

## 2018-08-30 VITALS — BP 116/76 | HR 86 | Temp 98.0°F | Ht 73.0 in | Wt 182.2 lb

## 2018-08-30 DIAGNOSIS — R195 Other fecal abnormalities: Secondary | ICD-10-CM | POA: Diagnosis not present

## 2018-08-30 DIAGNOSIS — E538 Deficiency of other specified B group vitamins: Secondary | ICD-10-CM

## 2018-08-30 MED ORDER — CYANOCOBALAMIN 1000 MCG/ML IJ SOLN
1000.0000 ug | Freq: Once | INTRAMUSCULAR | Status: AC
  Administered 2018-08-30: 1000 ug via INTRAMUSCULAR

## 2018-08-30 NOTE — Patient Instructions (Addendum)
I am glad diarrhea has resolved but sorry the soft stools have continued. Lets try Align or Florastor probiotics which you can get at your pharmacy for up to 2-4 months to see if this helps get your stools back in normal shape.   If diarrhea returns- reach out to me and I will order the C. Diff test again to be on safe side.

## 2018-08-30 NOTE — Progress Notes (Signed)
Subjective:  Timothy Gallagher is a 82 y.o. year old very pleasant male patient who presents for/with See problem oriented charting ROS- no recent lightheadedness. No chest pain. Did have some diarrhea 2 weeks ago now just loose stools   Past Medical History-  Patient Active Problem List   Diagnosis Date Noted  . Hyperthyroidism 01/13/2018    Priority: High  . Detached retina, left 06/30/2017    Priority: High  . AAA (abdominal aortic aneurysm) ([place]) 05/12/2017    Priority: High  . CAD (coronary artery disease) of artery bypass graft 05/19/2014    Priority: High  . BPPV (benign paroxysmal positional vertigo) 09/01/2016    Priority: Medium  . Insomnia 11/22/2014    Priority: Medium  . Anemia 07/20/2014    Priority: Medium  . BPH (benign prostatic hyperplasia) 12/05/2013    Priority: Medium  . Hyperlipidemia 03/13/2008    Priority: Medium  . B12 deficiency 06/07/2007    Priority: Medium  . Essential hypertension 04/09/2007    Priority: Medium  . Left sided abdominal pain 04/15/2017    Priority: Low  . Low back pain 02/06/2017    Priority: Low  . Anal fissure 04/07/2016    Priority: Low  . Internal and external bleeding hemorrhoids 01/22/2015    Priority: Low  . Palpitations 02/11/2013    Priority: Low  . OSTEOARTHRITIS, WRIST, RIGHT 08/05/2010    Priority: Low  . ACNE ROSACEA 06/27/2009    Priority: Low  . ESOPHAGEAL STRICTURE 04/27/2009    Priority: Low  . ACTINIC KERATOSIS, HEAD 04/18/2009    Priority: Low  . NECK PAIN 09/14/2008    Priority: Low  . NEUROPATHY, IDIOPATHIC PERIPHERAL NEC 08/11/2007    Priority: Low  . Irritable bowel syndrome 08/11/2007    Priority: Low  . ALLERGIC RHINITIS 04/09/2007    Priority: Low  . Chest pain 03/06/2018    Medications- reviewed and updated Current Outpatient Medications  Medication Sig Dispense Refill  . acetaminophen (TYLENOL) 500 MG tablet Take 500 mg by mouth daily as needed (for pain). Reported on 04/11/2016    .  aspirin 81 MG EC tablet Take 1 tablet (81 mg total) by mouth daily. (Patient taking differently: Take 81 mg by mouth every evening. )    . atropine 1 % ophthalmic solution Apply to eye.    . cyclopentolate (CYCLODRYL,CYCLOGYL) 1 % ophthalmic solution PLACE 1 DROP INTO LEFT EYE BID  2  . fluticasone (FLONASE) 50 MCG/ACT nasal spray SHAKE LIQUID AND USE 1 TO 2 SPRAYS IN EACH NOSTRIL DAILY AS NEEDED FOR ALLERGIES 16 g 5  . methimazole (TAPAZOLE) 10 MG tablet Take 1 tablet (10 mg total) by mouth daily. 30 tablet 3  . Multiple Vitamins-Minerals (CENTRUM SILVER PO) Take 1 tablet by mouth daily.    [name] Kitchen ofloxacin (OCUFLOX) 0.3 % ophthalmic solution Apply to eye.    . prednisoLONE acetate (PRED FORTE) 1 % ophthalmic suspension Apply to eye.    . tamsulosin (FLOMAX) 0.4 MG CAPS capsule Take 0.4 mg by mouth daily after supper.   3  . zolpidem (AMBIEN) 5 MG tablet TAKE 1 TABLET BY MOUTH AT BEDTIME AS NEEDED FOR SLEEP 30 tablet 5  . atorvastatin (LIPITOR) 20 MG tablet Take 1 tablet (20 mg total) by mouth daily. 90 tablet 3   No current facility-administered medications for this visit.     Objective: BP 116/76 (BP Location: Left Arm, Patient Position: Sitting, Cuff Size: Large)   Pulse 86   Temp 98 F (  36.7 C) (Oral)   Ht 6\' 1"  (1.854 m)   Wt 182 lb 3.2 oz (82.6 kg)   SpO2 95%   BMI 24.04 kg/m  Gen: NAD, resting comfortably CV: RRR no murmurs rubs or gallops Lungs: CTAB no crackles, wheeze, rhonchi Abdomen: soft/nontender/nondistended. No rebound or guarding.  Ext: no edema Skin: warm, dry Neuro: no lightheadedness with standing  Assessment/Plan:  Loose stools S: history of C. Diff treated last month- could not afford the vancomycin. He took a full course of flagyl. About 2 weeks ago did take some imodium for diarrhea ( only took once). No longer having diarrhea but is having loose stools on and off about every other day and when he has the loose stools may have 2 in a day. Type 5 on bristol  stool chart. Denies recent lightheadedness A/P: 82 year old male with history diarrhea with C. Diff s/p treatment who is having looser stools that have not full returned to prior baseline of firmer stools.  from avs "I am glad diarrhea has resolved but sorry the soft stools have continued. Lets try Align or Florastor probiotics which you can get at your pharmacy for up to 2-4 months to see if this helps get your stools back in normal shape.   If diarrhea returns- reach out to me and I will order the C. Diff test again to be on safe side. "  B12 deficiency S: B12 drops when has period off injections such as 03/09/18. B12 responded to weekly injections at that time with b12 in may of 427.  A/P: continue q4 week injections. Its at about 3 weeks today but will give today then can repeat at 08/27/18 visit in about 4 weeks then go back to nurse visits likely    Future Appointments  Date Time Provider [place]  08/31/2018  9:00 AM Renato Shin, MD [facility] None  09/27/2018  8:45 AM [name], MD [facility]  11/22/2018  9:00 AM Belva Crome, MD [facility]  06/21/2019 11:00 AM [facility] HEALTH COACH [facility]   Lab/Order associations: B12 deficiency - Plan: cyanocobalamin ((VITAMIN B-12)) injection 1,000 mcg  Meds ordered this encounter  Medications  . cyanocobalamin ((VITAMIN B-12)) injection 1,000 mcg   Return precautions advised.  [name], MD

## 2018-08-30 NOTE — Assessment & Plan Note (Signed)
S: B12 drops when has period off injections such as 03/09/18. B12 responded to weekly injections at that time with b12 in may of 427.  A/P: continue q4 week injections. Its at about 3 weeks today but will give today then can repeat at 08/27/18 visit in about 4 weeks then go back to nurse visits likely

## 2018-08-31 ENCOUNTER — Ambulatory Visit (INDEPENDENT_AMBULATORY_CARE_PROVIDER_SITE_OTHER): Payer: Medicare Other | Admitting: Endocrinology

## 2018-08-31 ENCOUNTER — Encounter: Payer: Self-pay | Admitting: Endocrinology

## 2018-08-31 ENCOUNTER — Ambulatory Visit: Payer: Medicare Other | Admitting: Internal Medicine

## 2018-08-31 VITALS — BP 130/70 | HR 95 | Ht 73.0 in | Wt 183.0 lb

## 2018-08-31 DIAGNOSIS — E0590 Thyrotoxicosis, unspecified without thyrotoxic crisis or storm: Secondary | ICD-10-CM | POA: Diagnosis not present

## 2018-08-31 DIAGNOSIS — H33052 Total retinal detachment, left eye: Secondary | ICD-10-CM | POA: Diagnosis not present

## 2018-08-31 LAB — T4, FREE: Free T4: 0.7 ng/dL (ref 0.60–1.60)

## 2018-08-31 LAB — TSH: TSH: 4.04 u[IU]/mL (ref 0.35–4.50)

## 2018-08-31 NOTE — Progress Notes (Signed)
Subjective:    Patient ID: Timothy Gallagher, male    DOB: [birth year], 82 y.o.   MRN: [id number]  HPI Pt returns for f/u of hyperthyroidism (dx'ed early 2019; TFT  Improved initially, but recurred in July, 2019, and was started on tapazole; he has never had thyroid imaging; he takes toprol for CAD, not thyroid; he declines RAI).  pt states he feels wel in general.   Past Medical History:  Diagnosis Date  . ACNE ROSACEA 06/27/2009  . ACTINIC KERATOSIS, HEAD 04/18/2009  . Acute maxillary sinusitis 05/14/2010  . ALLERGIC RHINITIS 04/09/2007  . Anal fissure 04/07/2016  . B12 DEFICIENCY 06/07/2007  . BACK PAIN WITH RADICULOPATHY 04/24/2008  . Cancer ([place])    skin  . CHEST WALL PAIN, ACUTE 06/15/2009  . Chronic maxillary sinusitis 05/29/2008  . COLITIS 04/27/2009  . COLONIC POLYPS, HX OF 04/27/2009   tubular adenomas  . Coronary artery disease 05/12/2014   Cath 05/12/2014 w/ severe 3-vessel CAD and preserved LV function, EF 55%  . DERMATITIS, ATOPIC 10/12/2007  . DIVERTICULOSIS, COLON 04/27/2009  . ECCHYMOSES, SPONTANEOUS 06/27/2008  . Elevated sedimentation rate 05/02/2009  . ESOPHAGEAL STRICTURE 04/27/2009  . GASTRITIS, CHRONIC 04/27/2009  . HYPERLIPIDEMIA 03/13/2008  . HYPERTENSION 04/09/2007  . Internal bleeding hemorrhoids 01/22/2015   01/22/2015 Seen at anoscopy, grade 1 all 3 positions   . Irritable bowel syndrome 08/11/2007  . KIDNEY DISEASE 04/09/2007  . NECK PAIN 09/14/2008  . NEUROPATHY, IDIOPATHIC PERIPHERAL NEC 08/11/2007  . OSTEOARTHRITIS, WRIST, RIGHT 08/05/2010  . Postoperative delirium 05/20/2014  . S/P CABG x 4 05/19/2014   LIMA to LAD, SVG to diag, SVG to OM, SVG to PDA, EVH via right thigh and leg    Past Surgical History:  Procedure Laterality Date  . CARDIAC CATHETERIZATION    . COLONOSCOPY    . CORONARY ARTERY BYPASS GRAFT N/A 05/19/2014   Procedure: CORONARY ARTERY BYPASS GRAFTING (CABG);  Surgeon: Rexene Alberts, MD;  Location: [place] [facility];  Service: Open Heart Surgery;  Laterality: N/A;   Times 4 using left internal mammary artery and endoscopically harvested right saphenous vein  . ESOPHAGOGASTRODUODENOSCOPY    . FINGER SURGERY     cut off end of finger  . FLEXIBLE SIGMOIDOSCOPY    . HEMORRHOID BANDING    . HERNIA REPAIR    . INCISION AND DRAINAGE WOUND WITH FOREIGN BODY REMOVAL Left 12/20/2013   Procedure: INCISION AND DRAINAGE LEFT INDEX FINGER;  Surgeon: Tennis Must, MD;  Location: [facility];  Service: Orthopedics;  Laterality: Left;  . INTRAOPERATIVE TRANSESOPHAGEAL ECHOCARDIOGRAM N/A 05/19/2014   Procedure: INTRAOPERATIVE TRANSESOPHAGEAL ECHOCARDIOGRAM;  Surgeon: Rexene Alberts, MD;  Location: [place];  Service: Open Heart Surgery;  Laterality: N/A;  . lamenectomy    . LEFT HEART CATHETERIZATION WITH CORONARY ANGIOGRAM N/A 05/12/2014   Procedure: LEFT HEART CATHETERIZATION WITH CORONARY ANGIOGRAM;  Surgeon: Sinclair Grooms, MD;  Location: [facility] CATH LAB;  Service: Cardiovascular;  Laterality: N/A;  . LUMBAR FUSION    . TONSILLECTOMY    . VARICOSE VEIN SURGERY Left     Social History   Socioeconomic History  . Marital status: Married    Spouse name: Not on file  . Number of children: 4  . Years of education: Not on file  . Highest education level: Not on file  Occupational History  . Occupation: retired    Fish farm manager: RETIRED    Comment: from [employer]  . Financial resource strain: Not on file  .  Food insecurity:    Worry: Not on file    Inability: Not on file  . Transportation needs:    Medical: Not on file    Non-medical: Not on file  Tobacco Use  . Smoking status: Former Smoker    Packs/day: 0.50    Years: 42.00    Pack years: 21.00    Types: Cigarettes    Last attempt to quit: 11/17/1998    Years since quitting: 19.8  . Smokeless tobacco: Never Used  Substance and Sexual Activity  . Alcohol use: No  . Drug use: No  . Sexual activity: Yes  Lifestyle  . Physical activity:    Days per week: Not on file    Minutes per session: Not on file    . Stress: Not on file  Relationships  . Social connections:    Talks on phone: Not on file    Gets together: Not on file    Attends religious service: Not on file    Active member of club or organization: Not on file    Attends meetings of clubs or organizations: Not on file    Relationship status: Not on file  . Intimate partner violence:    Fear of current or ex partner: Not on file    Emotionally abused: Not on file    Physically abused: Not on file    Forced sexual activity: Not on file  Other Topics Concern  . Not on file  Social History Narrative   Married 35 years (2nd marriage). 2 kids from previous marriage. 2 stepchildren. 8 grandkids.       Retired from [employer]: walking/exercise, building in shop-furniture (cut finger off in February doing this)   6 grand children - Has bought them all a new car    Brother died of probably addiction   Sister also died; not sure of cause    He enjoys his life; Likes to play the keyboard; guitar    Had played in a gospel quartet     Current Outpatient Medications on File Prior to Visit  Medication Sig Dispense Refill  . acetaminophen (TYLENOL) 500 MG tablet Take 500 mg by mouth daily as needed (for pain). Reported on 04/11/2016    . aspirin 81 MG EC tablet Take 1 tablet (81 mg total) by mouth daily. (Patient taking differently: Take 81 mg by mouth every evening. )    . atropine 1 % ophthalmic solution Apply to eye.    . cyclopentolate (CYCLODRYL,CYCLOGYL) 1 % ophthalmic solution PLACE 1 DROP INTO LEFT EYE BID  2  . fluticasone (FLONASE) 50 MCG/ACT nasal spray SHAKE LIQUID AND USE 1 TO 2 SPRAYS IN EACH NOSTRIL DAILY AS NEEDED FOR ALLERGIES 16 g 5  . Multiple Vitamins-Minerals (CENTRUM SILVER PO) Take 1 tablet by mouth daily.    [name] Kitchen ofloxacin (OCUFLOX) 0.3 % ophthalmic solution Apply to eye.    . prednisoLONE acetate (PRED FORTE) 1 % ophthalmic suspension Apply to eye.    . tamsulosin (FLOMAX) 0.4 MG CAPS  capsule Take 0.4 mg by mouth daily after supper.   3  . zolpidem (AMBIEN) 5 MG tablet TAKE 1 TABLET BY MOUTH AT BEDTIME AS NEEDED FOR SLEEP 30 tablet 5  . atorvastatin (LIPITOR) 20 MG tablet Take 1 tablet (20 mg total) by mouth daily. 90 tablet 3   No current facility-administered medications on file prior to visit.     Allergies  Allergen Reactions  .  Lipitor [Atorvastatin] Other (See Comments)    REACTION: nausea and blurred vision  . Trazodone And Nefazodone Other (See Comments)    dizzy  . Ciprofloxacin Swelling  . Mycophenolate Mofetil Other (See Comments)    REACTION: unspecified  . Amoxicillin Rash    Has patient had a PCN reaction causing immediate rash, facial/tongue/throat swelling, SOB or lightheadedness with hypotension: Unknown Has patient had a PCN reaction causing severe rash involving mucus membranes or skin necrosis: Unknown Has patient had a PCN reaction that required hospitalization: Unknown Has patient had a PCN reaction occurring within the last 10 years: Unknown If all of the above answers are "NO", then may proceed with Cephalosporin use.   [name] Kitchen Penicillins Rash    Has patient had a PCN reaction causing immediate rash, facial/tongue/throat swelling, SOB or lightheadedness with hypotension: Unknown Has patient had a PCN reaction causing severe rash involving mucus membranes or skin necrosis: Unknown Has patient had a PCN reaction that required hospitalization: Unknown Has patient had a PCN reaction occurring within the last 10 years: Unknown If all of the above answers are "NO", then may proceed with Cephalosporin use.   . Rosuvastatin Other (See Comments)    Unknown     Family History  Problem Relation Age of Onset  . Hernia Mother   . Heart disease Father        smoker  . COPD Father   . Heart attack Brother   . Early death Daughter   . Colon cancer Neg Hx     BP 130/70 (BP Location: Left Arm, Patient Position: Sitting, Cuff Size: Normal)   Pulse 95    Ht 6\' 1"  (1.854 m)   Wt 183 lb (83 kg)   SpO2 96%   BMI 24.14 kg/m    Review of Systems Denies fever.      Objective:   Physical Exam VITAL SIGNS:  See vs page GENERAL: no distress NECK: There is no palpable thyroid enlargement.  No thyroid nodule is palpable.  No palpable lymphadenopathy at the anterior neck.    Lab Results  Component Value Date   TSH 4.04 08/31/2018      Assessment & Plan:  Hyperthyroidism: well-controlled CAD: in this context, he needs to maintain euthyroidism   Patient Instructions  blood tests are requested for you today.  We'll let you know about the results. If ever you have fever while taking methimazole, stop it and call us, even if the reason is obvious, because of the risk of a rare side-effect.  Please come back for a follow-up appointment in 4 months.

## 2018-08-31 NOTE — Patient Instructions (Signed)
blood tests are requested for you today.  We'll let you know about the results. If ever you have fever while taking methimazole, stop it and call us, even if the reason is obvious, because of the risk of a rare side-effect. Please come back for a follow-up appointment in 4 months.  

## 2018-09-01 ENCOUNTER — Encounter

## 2018-09-01 ENCOUNTER — Ambulatory Visit: Payer: Medicare Other | Admitting: Internal Medicine

## 2018-09-01 ENCOUNTER — Telehealth: Payer: Self-pay

## 2018-09-01 ENCOUNTER — Other Ambulatory Visit: Payer: Self-pay

## 2018-09-01 MED ORDER — METHIMAZOLE 10 MG PO TABS: 10.0000 mg | ORAL_TABLET | Freq: Every day | ORAL | 3 refills | Status: DC

## 2018-09-01 NOTE — Telephone Encounter (Signed)
-----   Message from Renato Shin, MD sent at 08/31/2018  6:08 PM EDT ----- please call patient: Normal.  Please continue the same medication. I'll see you next time.

## 2018-09-01 NOTE — Telephone Encounter (Signed)
Refill request for methimazole approved by Dr. Loanne Drilling and sent to pt pharmacy of choice.

## 2018-09-01 NOTE — Telephone Encounter (Signed)
Called pt and informed of lab results. Verbalized acceptance and understanding.

## 2018-09-02 ENCOUNTER — Telehealth: Payer: Self-pay | Admitting: Family Medicine

## 2018-09-02 NOTE — Telephone Encounter (Signed)
See note

## 2018-09-02 NOTE — Telephone Encounter (Signed)
Spoke with pts wife to inform. Verbalized understanding.

## 2018-09-02 NOTE — Telephone Encounter (Signed)
Copied from [place] [id number]. Topic: General - Other >> Sep 02, 2018  8:13 AM Keene Breath wrote: Reason for CRM: Patient called to inform the doctor that the medicine he prescribed is not helping his stomach issues or his diarrhea.  Patient feels he needs to take something else.  Please advise.  CB# [phone].

## 2018-09-02 NOTE — Telephone Encounter (Signed)
Probiotics are not a short term solution- we need him to take these consistently for at least a month to see if helps. If symptoms worsen from loose stool to diarrhea- I need to know that so I can repeat his C. Diff test

## 2018-09-08 ENCOUNTER — Other Ambulatory Visit: Payer: Self-pay | Admitting: Endocrinology

## 2018-09-10 ENCOUNTER — Other Ambulatory Visit: Payer: Self-pay | Admitting: Family Medicine

## 2018-09-27 ENCOUNTER — Ambulatory Visit: Payer: Medicare Other | Admitting: Family Medicine

## 2018-09-27 ENCOUNTER — Encounter: Payer: Self-pay | Admitting: Family Medicine

## 2018-09-27 VITALS — BP 118/70 | HR 85 | Temp 97.7°F | Ht 73.0 in | Wt 184.6 lb

## 2018-09-27 DIAGNOSIS — E0590 Thyrotoxicosis, unspecified without thyrotoxic crisis or storm: Secondary | ICD-10-CM

## 2018-09-27 DIAGNOSIS — E538 Deficiency of other specified B group vitamins: Secondary | ICD-10-CM | POA: Diagnosis not present

## 2018-09-27 DIAGNOSIS — E785 Hyperlipidemia, unspecified: Secondary | ICD-10-CM | POA: Diagnosis not present

## 2018-09-27 MED ORDER — CYANOCOBALAMIN 1000 MCG/ML IJ SOLN
1000.0000 ug | Freq: Once | INTRAMUSCULAR | Status: AC
  Administered 2018-09-27: 1000 ug via INTRAMUSCULAR

## 2018-09-27 NOTE — Progress Notes (Signed)
Subjective:  Timothy Gallagher is a 82 y.o. year old very pleasant male patient who presents for/with See problem oriented charting ROS- poor sleep, loose stools have been much improved though did have some last night, no edema, no reported chest pain  Past Medical History-  Patient Active Problem List   Diagnosis Date Noted  . Hyperthyroidism 01/13/2018    Priority: High  . Detached retina, left 06/30/2017    Priority: High  . AAA (abdominal aortic aneurysm) ([place]) 05/12/2017    Priority: High  . CAD (coronary artery disease) of artery bypass graft 05/19/2014    Priority: High  . BPPV (benign paroxysmal positional vertigo) 09/01/2016    Priority: Medium  . Insomnia 11/22/2014    Priority: Medium  . Anemia 07/20/2014    Priority: Medium  . BPH (benign prostatic hyperplasia) 12/05/2013    Priority: Medium  . Hyperlipidemia 03/13/2008    Priority: Medium  . B12 deficiency 06/07/2007    Priority: Medium  . Essential hypertension 04/09/2007    Priority: Medium  . Left sided abdominal pain 04/15/2017    Priority: Low  . Low back pain 02/06/2017    Priority: Low  . Anal fissure 04/07/2016    Priority: Low  . Internal and external bleeding hemorrhoids 01/22/2015    Priority: Low  . Palpitations 02/11/2013    Priority: Low  . OSTEOARTHRITIS, WRIST, RIGHT 08/05/2010    Priority: Low  . ACNE ROSACEA 06/27/2009    Priority: Low  . ESOPHAGEAL STRICTURE 04/27/2009    Priority: Low  . ACTINIC KERATOSIS, HEAD 04/18/2009    Priority: Low  . NECK PAIN 09/14/2008    Priority: Low  . NEUROPATHY, IDIOPATHIC PERIPHERAL NEC 08/11/2007    Priority: Low  . Irritable bowel syndrome 08/11/2007    Priority: Low  . ALLERGIC RHINITIS 04/09/2007    Priority: Low  . Chest pain 03/06/2018    Medications- reviewed and updated Current Outpatient Medications  Medication Sig Dispense Refill  . acetaminophen (TYLENOL) 500 MG tablet Take 500 mg by mouth daily as needed (for pain). Reported on  04/11/2016    . aspirin 81 MG EC tablet Take 1 tablet (81 mg total) by mouth daily. (Patient taking differently: Take 81 mg by mouth every evening. )    . atropine 1 % ophthalmic solution Apply to eye.    . cyclopentolate (CYCLODRYL,CYCLOGYL) 1 % ophthalmic solution PLACE 1 DROP INTO LEFT EYE BID  2  . fluticasone (FLONASE) 50 MCG/ACT nasal spray SHAKE LIQUID AND USE 1 TO 2 SPRAYS IN EACH NOSTRIL DAILY AS NEEDED FOR ALLERGIES 16 g 5  . methimazole (TAPAZOLE) 10 MG tablet Take 1 tablet (10 mg total) by mouth daily. 30 tablet 3  . Multiple Vitamins-Minerals (CENTRUM SILVER PO) Take 1 tablet by mouth daily.    . tamsulosin (FLOMAX) 0.4 MG CAPS capsule Take 0.4 mg by mouth daily after supper.   3  . zolpidem (AMBIEN) 5 MG tablet TAKE 1 TABLET BY MOUTH AT BEDTIME AS NEEDED FOR SLEEP 30 tablet 5  . atorvastatin (LIPITOR) 20 MG tablet Take 1 tablet (20 mg total) by mouth daily. 90 tablet 3   No current facility-administered medications for this visit.     Objective: BP 118/70 (BP Location: Left Arm, Patient Position: Sitting, Cuff Size: Large)   Pulse 85   Temp 97.7 F (36.5 C) (Oral)   Ht 6\' 1"  (1.854 m)   Wt 184 lb 9.6 oz (83.7 kg)   SpO2 95%   BMI  24.36 kg/m  Gen: NAD, resting comfortably CV: RRR no murmurs rubs or gallops Lungs: CTAB no crackles, wheeze, rhonchi Abdomen: soft/nontender Ext: no edema Skin: warm, dry  Assessment/Plan:  Other notes: 1.ambien allows about 4 hours a sleep 2. Bowels have done much better on align probiotic. Does wonder if ate something a little off as had several loose stools last night but other than that has been feeling good.   Hyperthyroidism S: On thyroid medication-methimazole through endocrinology  Did have a few weeks of night sweats but has stopped in last week.  Lab Results  Component Value Date   TSH 4.04 08/31/2018  A/P: asked patient to follow up if recurrence of the night sweats- TSH levels have been good and has follow up scheduled  with Dr. Loanne Drilling so appears stable  Hyperlipidemia S:  controlled on atorvastatin 20mg  Lab Results  Component Value Date   CHOL 123 03/09/2018   HDL 51.10 03/09/2018   LDLCALC 60 03/09/2018   LDLDIRECT 91.0 05/23/2016   TRIG 55.0 03/09/2018   CHOLHDL 2 03/09/2018   A/P: LDL under 60 at goal- repeat next April 23rd or later   Future Appointments  Date Time Provider [place]  10/27/2018  9:30 AM [facility] NURSE [facility]  11/22/2018  9:00 AM Belva Crome, MD [facility]  12/28/2018  9:20 AM [name], MD [facility]  01/03/2019  9:00 AM Renato Shin, MD [facility] None  06/21/2019 11:00 AM [facility] HEALTH COACH [facility]   1 month B12 shot 3 to 6 months with me or sooner if needed  Lab/Order associations: B12 deficiency - Plan: cyanocobalamin ((VITAMIN B-12)) injection 1,000 mcg  Hyperthyroidism  Hyperlipidemia, unspecified hyperlipidemia type  Meds ordered this encounter  Medications  . cyanocobalamin ((VITAMIN B-12)) injection 1,000 mcg   Return precautions advised.  [name], MD

## 2018-09-27 NOTE — Assessment & Plan Note (Signed)
S:  controlled on atorvastatin 20mg  Lab Results  Component Value Date   CHOL 123 03/09/2018   HDL 51.10 03/09/2018   LDLCALC 60 03/09/2018   LDLDIRECT 91.0 05/23/2016   TRIG 55.0 03/09/2018   CHOLHDL 2 03/09/2018   A/P: LDL under 60 at goal- repeat next April 23rd or later

## 2018-09-27 NOTE — Assessment & Plan Note (Signed)
S: On thyroid medication-methimazole through endocrinology  Did have a few weeks of night sweats but has stopped in last week.  Lab Results  Component Value Date   TSH 4.04 08/31/2018  A/P: asked patient to follow up if recurrence of the night sweats- TSH levels have been good and has follow up scheduled with Dr. Loanne Drilling so appears stable

## 2018-09-27 NOTE — Patient Instructions (Addendum)
No changes today  Continue the align- glad it seems to be helping (other than the rough night last night)   Make sure to get your next b12 shot scheduled in a month  Why dont we check in 3-6 months from now- happy to see you sooner if needed

## 2018-09-28 ENCOUNTER — Telehealth: Payer: Self-pay | Admitting: Family Medicine

## 2018-09-28 NOTE — Telephone Encounter (Signed)
Copied from [place] [id number]. Topic: General - Other >> Sep 28, 2018  8:46 AM Oneta Rack wrote: Relation to pt: self  Call back number: [phone]   Reason for call:  Patient was seen 09/27/18 by [name] and PCP advised to follow up if symptoms didn't improve, patient experiencing pain in lower right side, night sweats and patient was unable to sleep, please advise

## 2018-09-28 NOTE — Telephone Encounter (Signed)
Night sweats resolved at time of last visit. Pain was not reported- is he having good solid bowel movements? At least 1 soft BM a day? That's a good place to start- if not have him do miralax 1 capful daily and update me in 1 week- if pain worsens see [place] sooner

## 2018-09-29 NOTE — Telephone Encounter (Signed)
Called and left a voicemail message asking for a return phone call 

## 2018-09-30 NOTE — Telephone Encounter (Signed)
Spoke to pt told him calling regards to pain right lower quadrant and night sweats. Asked pt if having good solid bowel movements at least one a day? Pt said yes bowels are fine and pain is gone it comes and goes. Told pt okay continue to monitor if pain becomes severe please let us know and schedule an appt. Pt verbalized understanding.

## 2018-10-05 DIAGNOSIS — H33052 Total retinal detachment, left eye: Secondary | ICD-10-CM | POA: Diagnosis not present

## 2018-10-15 ENCOUNTER — Other Ambulatory Visit: Payer: Self-pay | Admitting: Family Medicine

## 2018-10-20 DIAGNOSIS — M545 Low back pain: Secondary | ICD-10-CM | POA: Diagnosis not present

## 2018-10-21 DIAGNOSIS — M5136 Other intervertebral disc degeneration, lumbar region: Secondary | ICD-10-CM | POA: Insufficient documentation

## 2018-10-27 ENCOUNTER — Ambulatory Visit (INDEPENDENT_AMBULATORY_CARE_PROVIDER_SITE_OTHER): Payer: Medicare Other

## 2018-10-27 DIAGNOSIS — E538 Deficiency of other specified B group vitamins: Secondary | ICD-10-CM | POA: Diagnosis not present

## 2018-10-27 MED ORDER — CYANOCOBALAMIN 1000 MCG/ML IJ SOLN
1000.0000 ug | Freq: Once | INTRAMUSCULAR | Status: AC
  Administered 2018-10-27: 1000 ug via INTRAMUSCULAR

## 2018-10-27 NOTE — Progress Notes (Signed)
Per orders of [name], injection of Vitamin b12 1000 mcg given right deltoid IM by Kevan Ny, CMA  Patient tolerated injection well.  Pt will schedule his next injection at check out for 1 month.

## 2018-11-04 ENCOUNTER — Ambulatory Visit: Payer: Medicare Other | Admitting: Internal Medicine

## 2018-11-04 ENCOUNTER — Ambulatory Visit: Payer: Medicare Other | Admitting: Interventional Cardiology

## 2018-11-15 ENCOUNTER — Ambulatory Visit: Payer: Self-pay

## 2018-11-15 NOTE — Telephone Encounter (Signed)
Pt called to states that he has sinus symptoms that he would like to treat with OTC allergy medication. Pt denies pain to sinus areas.  He denies sore throat and and cough. He states that his nose is very runny and last night it stopped up when he went to bed and he had to sit up all night. Home care advice read to patient. Pt verbalized understanding of all instructions. Pt was advised to seek assistance pf pharmacist for OTC medication advice. Pt verbalized understanding of these instructions.  Reason for Disposition . [1] Sinus congestion as part of a cold AND [2] present < 10 days  Answer Assessment - Initial Assessment Questions 1. LOCATION: "Where does it hurt?"      no 2. ONSET: "When did the sinus pain start?"  (e.g., hours, days)      Couple days ago 3. SEVERITY: "How bad is the pain?"   (Scale 1-10; mild, moderate or severe)   - MILD (1-3): doesn't interfere with normal activities    - MODERATE (4-7): interferes with normal activities (e.g., work or school) or awakens from sleep   - SEVERE (8-10): excruciating pain and patient unable to do any normal activities        no 4. RECURRENT SYMPTOM: "Have you ever had sinus problems before?" If so, ask: "When was the last time?" and "What happened that time?"      Sinus issues before 5. NASAL CONGESTION: "Is the nose blocked?" If so, ask, "Can you open it or must you breathe through the mouth?"     runny 6. NASAL DISCHARGE: "Do you have discharge from your nose?" If so ask, "What color?"     clear 7. FEVER: "Do you have a fever?" If so, ask: "What is it, how was it measured, and when did it start?"      no 8. OTHER SYMPTOMS: "Do you have any other symptoms?" (e.g., sore throat, cough, earache, difficulty breathing)     no 9. PREGNANCY: "Is there any chance you are pregnant?" "When was your last menstrual period?"     N/A  Protocols used: SINUS PAIN OR CONGESTION-A-AH

## 2018-11-16 DIAGNOSIS — H33052 Total retinal detachment, left eye: Secondary | ICD-10-CM | POA: Diagnosis not present

## 2018-11-18 DIAGNOSIS — R351 Nocturia: Secondary | ICD-10-CM | POA: Diagnosis not present

## 2018-11-18 DIAGNOSIS — N400 Enlarged prostate without lower urinary tract symptoms: Secondary | ICD-10-CM | POA: Diagnosis not present

## 2018-11-19 ENCOUNTER — Ambulatory Visit (INDEPENDENT_AMBULATORY_CARE_PROVIDER_SITE_OTHER): Payer: Medicare Other | Admitting: Family Medicine

## 2018-11-19 ENCOUNTER — Encounter: Payer: Self-pay | Admitting: Family Medicine

## 2018-11-19 VITALS — BP 122/80 | HR 91 | Temp 98.0°F | Ht 73.0 in | Wt 185.0 lb

## 2018-11-19 DIAGNOSIS — B9689 Other specified bacterial agents as the cause of diseases classified elsewhere: Secondary | ICD-10-CM

## 2018-11-19 DIAGNOSIS — E538 Deficiency of other specified B group vitamins: Secondary | ICD-10-CM

## 2018-11-19 DIAGNOSIS — Z8619 Personal history of other infectious and parasitic diseases: Secondary | ICD-10-CM | POA: Diagnosis not present

## 2018-11-19 DIAGNOSIS — J329 Chronic sinusitis, unspecified: Secondary | ICD-10-CM

## 2018-11-19 MED ORDER — DOXYCYCLINE HYCLATE 100 MG PO TABS: 100.0000 mg | ORAL_TABLET | Freq: Two times a day (BID) | ORAL | 0 refills | Status: DC

## 2018-11-19 MED ORDER — CYANOCOBALAMIN 1000 MCG/ML IJ SOLN
1000.0000 ug | Freq: Once | INTRAMUSCULAR | Status: AC
  Administered 2018-11-19: 1000 ug via INTRAMUSCULAR

## 2018-11-19 NOTE — Patient Instructions (Addendum)
Sinsusitis/Sinus infection Bacterial based on: Symptoms >10 days  Treatment: -considered steroid: we opted out of prednisone -other symptomatic care with mucinex but have to stay well hydrated -Antibiotic indicated: yes  Finally, we reviewed reasons to return to care including if symptoms worsen or persist or new concerns arise (particularly fever or shortness of breath)  Need another b12 shot in a month   Meds ordered this encounter  Medications  . doxycycline (VIBRA-TABS) 100 MG tablet    Sig: Take 1 tablet (100 mg total) by mouth 2 (two) times daily for 7 days.    Dispense:  14 tablet    Refill:  0

## 2018-11-19 NOTE — Progress Notes (Signed)
PCP: [name], MD  Subjective:  Timothy Gallagher is a 83 y.o. year old very pleasant male patient who presents with sinusitis symptoms including nasal congestion, sinus tenderness -other symptoms include: Cough, congestion. Some soreness in abdomen from coughing. . Blood in nose. Yellow discharge from nose. Also coughing this up.  -day of illness:Day 11 today -Symptoms are improving slightly today -previous treatments: Rest, hydration. -sick contacts/travel/risks: denies flu exposure.  -Hx of: C. difficile so he wants to be cautious about antibiotic choice  Patient also updates me that he was taken off of Flomax and started on Myrbetriq by his urologist-his first dose of Myrbetriq was today  ROS-denies fever, SOB, NVD, tooth pain  Pertinent Past Medical History-  Patient Active Problem List   Diagnosis Date Noted  . Hyperthyroidism 01/13/2018    Priority: High  . Detached retina, left 06/30/2017    Priority: High  . AAA (abdominal aortic aneurysm) ([place]) 05/12/2017    Priority: High  . CAD (coronary artery disease) of artery bypass graft 05/19/2014    Priority: High  . BPPV (benign paroxysmal positional vertigo) 09/01/2016    Priority: Medium  . Insomnia 11/22/2014    Priority: Medium  . Anemia 07/20/2014    Priority: Medium  . BPH (benign prostatic hyperplasia) 12/05/2013    Priority: Medium  . Hyperlipidemia 03/13/2008    Priority: Medium  . B12 deficiency 06/07/2007    Priority: Medium  . Essential hypertension 04/09/2007    Priority: Medium  . Left sided abdominal pain 04/15/2017    Priority: Low  . Low back pain 02/06/2017    Priority: Low  . Anal fissure 04/07/2016    Priority: Low  . Internal and external bleeding hemorrhoids 01/22/2015    Priority: Low  . Palpitations 02/11/2013    Priority: Low  . OSTEOARTHRITIS, WRIST, RIGHT 08/05/2010    Priority: Low  . ACNE ROSACEA 06/27/2009    Priority: Low  . ESOPHAGEAL STRICTURE 04/27/2009    Priority:  Low  . ACTINIC KERATOSIS, HEAD 04/18/2009    Priority: Low  . NECK PAIN 09/14/2008    Priority: Low  . NEUROPATHY, IDIOPATHIC PERIPHERAL NEC 08/11/2007    Priority: Low  . Irritable bowel syndrome 08/11/2007    Priority: Low  . ALLERGIC RHINITIS 04/09/2007    Priority: Low  . Chest pain 03/06/2018    Medications- reviewed  Current Outpatient Medications  Medication Sig Dispense Refill  . acetaminophen (TYLENOL) 500 MG tablet Take 500 mg by mouth daily as needed (for pain). Reported on 04/11/2016    . aspirin 81 MG EC tablet Take 1 tablet (81 mg total) by mouth daily. (Patient taking differently: Take 81 mg by mouth every evening. )    . fluticasone (FLONASE) 50 MCG/ACT nasal spray SHAKE LIQUID AND USE 1 TO 2 SPRAYS IN EACH NOSTRIL DAILY AS NEEDED FOR ALLERGIES 16 g 5  . methimazole (TAPAZOLE) 10 MG tablet Take 1 tablet (10 mg total) by mouth daily. 30 tablet 3  . mirabegron ER (MYRBETRIQ) 50 MG TB24 tablet Take 50 mg by mouth daily.    . Multiple Vitamins-Minerals (CENTRUM SILVER PO) Take 1 tablet by mouth daily.    [name] Kitchen zolpidem (AMBIEN) 5 MG tablet TAKE 1 TABLET BY MOUTH AT BEDTIME AS NEEDED FOR SLEEP 30 tablet 5  . atorvastatin (LIPITOR) 20 MG tablet Take 1 tablet (20 mg total) by mouth daily. 90 tablet 3   No current facility-administered medications for this visit.     Objective: BP  122/80 (BP Location: Left Arm, Patient Position: Sitting, Cuff Size: Normal)   Pulse 91   Temp 98 F (36.7 C) (Oral)   Ht 6\' 1"  (1.854 m)   Wt 185 lb (83.9 kg)   SpO2 94%   BMI 24.41 kg/m  Gen: NAD, resting comfortably HEENT: Turbinates erythematous with yellow drainage with some dried blood noted, TM normal, pharynx mildly erythematous with no tonsilar exudate or edema, bilateral maxillary and frontal sinus tenderness CV: RRR no murmurs rubs or gallops Lungs: CTAB no crackles, wheeze, rhonchi Ext: no edema Skin: warm, dry, no rash  Assessment/Plan:  Sinsusitis/Sinus infection Bacterial  based on: Symptoms >10 days  Treatment: -considered steroid: we opted out of prednisone -other symptomatic care with mucinex but have to stay well hydrated -Antibiotic indicated: yes.  Patient had C. difficile earlier this year and then had some recurrent diarrhea issues even after that.  We initially considered Augmentin but then opted to trial doxycycline instead with hopes this would be less likely to produce diarrhea  Finally, we reviewed reasons to return to care including if symptoms worsen or persist or new concerns arise (particularly fever or shortness of breath)  Meds ordered this encounter  Medications  . cyanocobalamin ((VITAMIN B-12)) injection 1,000 mcg  . doxycycline (VIBRA-TABS) 100 MG tablet    Sig: Take 1 tablet (100 mg total) by mouth 2 (two) times daily for 7 days.    Dispense:  14 tablet    Refill:  0   [name], MD

## 2018-11-21 NOTE — Progress Notes (Signed)
Cardiology Office Note:    Date:  11/22/2018   ID:  [name], DOB 03-08-1936, MRN [id number]  PCP:  [name], MD  Cardiologist:  No primary care provider on file.   Referring MD: [name], MD   Chief Complaint  Patient presents with  . Coronary Artery Disease    History of Present Illness:    Timothy Gallagher is a 83 y.o. male with a hx of coronary artery disease, history of CABG (LIMA-LAD, SVG-PDA, SVG-OM, SVG-diagonal) by Dr. [name] 2015, hyperlipidemia, AAA (3.3 cm), hypertension, and history of kidney disease.   Says he has not seen me in a long time and wonders why.  He denies angina, dyspnea, orthopnea, PND, and edema.  He has not needed to use nitroglycerin.  Has left calf discomfort and cramping.  Some difficulty with ambulating.  Story is not classic for claudication.  Chart review demonstrates "abdominal aortic aneurysm by CT", with status size of 3.3 cm.  He did not know about this.  We discussed the implications.   Past Medical History:  Diagnosis Date  . ACNE ROSACEA 06/27/2009  . ACTINIC KERATOSIS, HEAD 04/18/2009  . Acute maxillary sinusitis 05/14/2010  . ALLERGIC RHINITIS 04/09/2007  . Anal fissure 04/07/2016  . B12 DEFICIENCY 06/07/2007  . BACK PAIN WITH RADICULOPATHY 04/24/2008  . Cancer ([place])    skin  . CHEST WALL PAIN, ACUTE 06/15/2009  . Chronic maxillary sinusitis 05/29/2008  . COLITIS 04/27/2009  . COLONIC POLYPS, HX OF 04/27/2009   tubular adenomas  . Coronary artery disease 05/12/2014   Cath 05/12/2014 w/ severe 3-vessel CAD and preserved LV function, EF 55%  . DERMATITIS, ATOPIC 10/12/2007  . DIVERTICULOSIS, COLON 04/27/2009  . ECCHYMOSES, SPONTANEOUS 06/27/2008  . Elevated sedimentation rate 05/02/2009  . ESOPHAGEAL STRICTURE 04/27/2009  . GASTRITIS, CHRONIC 04/27/2009  . HYPERLIPIDEMIA 03/13/2008  . HYPERTENSION 04/09/2007  . Internal bleeding hemorrhoids 01/22/2015   01/22/2015 Seen at anoscopy, grade 1 all 3 positions   . Irritable bowel  syndrome 08/11/2007  . KIDNEY DISEASE 04/09/2007  . NECK PAIN 09/14/2008  . NEUROPATHY, IDIOPATHIC PERIPHERAL NEC 08/11/2007  . OSTEOARTHRITIS, WRIST, RIGHT 08/05/2010  . Postoperative delirium 05/20/2014  . S/P CABG x 4 05/19/2014   LIMA to LAD, SVG to diag, SVG to OM, SVG to PDA, EVH via right thigh and leg    Past Surgical History:  Procedure Laterality Date  . CARDIAC CATHETERIZATION    . COLONOSCOPY    . CORONARY ARTERY BYPASS GRAFT N/A 05/19/2014   Procedure: CORONARY ARTERY BYPASS GRAFTING (CABG);  Surgeon: Rexene Alberts, MD;  Location: [facility];  Service: Open Heart Surgery;  Laterality: N/A;  Times 4 using left internal mammary artery and endoscopically harvested right saphenous vein  . ESOPHAGOGASTRODUODENOSCOPY    . FINGER SURGERY     cut off end of finger  . FLEXIBLE SIGMOIDOSCOPY    . HEMORRHOID BANDING    . HERNIA REPAIR    . INCISION AND DRAINAGE WOUND WITH FOREIGN BODY REMOVAL Left 12/20/2013   Procedure: INCISION AND DRAINAGE LEFT INDEX FINGER;  Surgeon: Tennis Must, MD;  Location: [facility];  Service: Orthopedics;  Laterality: Left;  . INTRAOPERATIVE TRANSESOPHAGEAL ECHOCARDIOGRAM N/A 05/19/2014   Procedure: INTRAOPERATIVE TRANSESOPHAGEAL ECHOCARDIOGRAM;  Surgeon: Rexene Alberts, MD;  Location: [facility];  Service: Open Heart Surgery;  Laterality: N/A;  . lamenectomy    . LEFT HEART CATHETERIZATION WITH CORONARY ANGIOGRAM N/A 05/12/2014   Procedure: LEFT HEART CATHETERIZATION WITH CORONARY ANGIOGRAM;  Surgeon: Sinclair Grooms, MD;  [facility] C[facility];  Service: Cardiovascular;  Laterality: N/A;  . LUMBAR FUSION    . TONSILLECTOMY    . VARICOSE VEIN SURGERY Left     Current Medications: Current Meds  Medication Sig  . acetaminophen (TYLENOL) 500 MG tablet Take 500 mg by mouth daily as needed (for pain). Reported on 04/11/2016  . aspirin 81 MG EC tablet Take 1 tablet (81 mg total) by mouth daily. (Patient taking differently: Take 81 mg by mouth every evening. )  . atorvastatin  (LIPITOR) 20 MG tablet Take 1 tablet (20 mg total) by mouth daily.  [name] Kitchen doxycycline (VIBRA-TABS) 100 MG tablet Take 100 mg by mouth 2 (two) times daily.  . fluticasone (FLONASE) 50 MCG/ACT nasal spray SHAKE LIQUID AND USE 1 TO 2 SPRAYS IN EACH NOSTRIL DAILY AS NEEDED FOR ALLERGIES  . methimazole (TAPAZOLE) 10 MG tablet Take 1 tablet (10 mg total) by mouth daily.  . mirabegron ER (MYRBETRIQ) 50 MG TB24 tablet Take 50 mg by mouth daily.  . Multiple Vitamins-Minerals (CENTRUM SILVER PO) Take 1 tablet by mouth daily.  [name] Kitchen zolpidem (AMBIEN) 5 MG tablet TAKE 1 TABLET BY MOUTH AT BEDTIME AS NEEDED FOR SLEEP  . [DISCONTINUED] doxycycline (VIBRA-TABS) 100 MG tablet Take 1 tablet (100 mg total) by mouth 2 (two) times daily for 7 days.     Allergies:   Lipitor [atorvastatin]; Trazodone and nefazodone; Ciprofloxacin; Mycophenolate mofetil; Amoxicillin; Penicillins; and Rosuvastatin   Social History   Socioeconomic History  . Marital status: Married    Spouse name: Not on file  . Number of children: 4  . Years of education: Not on file  . Highest education level: Not on file  Occupational History  . Occupation: retired    Fish farm manager: RETIRED    Comment: from [employer]  . Financial resource strain: Not on file  . Food insecurity:    Worry: Not on file    Inability: Not on file  . Transportation needs:    Medical: Not on file    Non-medical: Not on file  Tobacco Use  . Smoking status: Former Smoker    Packs/day: 0.50    Years: 42.00    Pack years: 21.00    Types: Cigarettes    Last attempt to quit: 11/17/1998    Years since quitting: 20.0  . Smokeless tobacco: Never Used  Substance and Sexual Activity  . Alcohol use: No  . Drug use: No  . Sexual activity: Yes  Lifestyle  . Physical activity:    Days per week: Not on file    Minutes per session: Not on file  . Stress: Not on file  Relationships  . Social connections:    Talks on phone: Not on file    Gets together: Not on  file    Attends religious service: Not on file    Active member of club or organization: Not on file    Attends meetings of clubs or organizations: Not on file    Relationship status: Not on file  Other Topics Concern  . Not on file  Social History Narrative   Married 35 years (2nd marriage). 2 kids from previous marriage. 2 stepchildren. 8 grandkids.       Retired from [employer]: walking/exercise, building in shop-furniture (cut finger off in February doing this)   6 grand children - Has bought them all a new car  Brother died of probably addiction   Sister also died; not sure of cause    He enjoys his life; Likes to play the keyboard; guitar    Had played in a gospel quartet      Family History: The patient's family history includes COPD in his father; Early death in his daughter; Heart attack in his brother; Heart disease in his father; Hernia in his mother. There is no history of Colon cancer.  ROS:   Please see the history of present illness.    Decreased memory.  Pain and cramping in the left calf.  Slowing physical activity.  All other systems reviewed and are negative.  EKGs/Labs/Other Studies Reviewed:    The following studies were reviewed today: Abdominal CT scan June 2018: IMPRESSION: 1. No acute abnormality. 2. Extensive sigmoid colon diverticulosis. 3. Small, nonobstructing bilateral renal calculi. 4. Small left inguinal hernia containing fat and small umbilical hernia containing fat. 5. Stable 3.3 cm infrarenal abdominal aortic aneurysm. Recommend followup by ultrasound in 3 years. This recommendation follows ACR consensus guidelines: White Paper of the ACR Incidental Findings Committee II on Vascular Findings. Natasha Mead Coll Radiol 2013; [phone]  EKG:  EKG is performed on today's visit but in review of tracing from April 2019, right bundle branch block, left anterior hemiblock, and normal sinus rhythm are noted.  Recent  Labs: 01/10/2018: Magnesium 1.9 07/16/2018: ALT 12; BUN 13; Creatinine, Ser 1.05; Hemoglobin 13.0; Platelets 209.0; Potassium 4.7; Sodium 139 08/31/2018: TSH 4.04  Recent Lipid Panel    Component Value Date/Time   CHOL 123 03/09/2018 0850   CHOL 120 02/03/2018 0857   TRIG 55.0 03/09/2018 0850   HDL 51.10 03/09/2018 0850   HDL 51 02/03/2018 0857   CHOLHDL 2 03/09/2018 0850   VLDL 11.0 03/09/2018 0850   LDLCALC 60 03/09/2018 0850   LDLCALC 57 02/03/2018 0857   LDLDIRECT 91.0 05/23/2016 1346    Physical Exam:    VS:  BP 138/76   Pulse (!) 58   Ht 6\' 1"  (1.854 m)   Wt 183 lb (83 kg)   SpO2 96%   BMI 24.14 kg/m     Wt Readings from Last 3 Encounters:  11/22/18 183 lb (83 kg)  11/19/18 185 lb (83.9 kg)  09/27/18 184 lb 9.6 oz (83.7 kg)     GEN: Appears consistent with age.. No acute distress HEENT: Normal NECK: No JVD. LYMPHATICS: No lymphadenopathy CARDIAC: RRR.  No murmur, gallop, edema VASCULAR: Pulses decreased left posterior tibial., Bruits are not present in the carotids. RESPIRATORY:  Clear to auscultation without rales, wheezing or rhonchi  ABDOMEN: Soft, non-tender, non-distended, No pulsatile mass, MUSCULOSKELETAL: No deformity  SKIN: Warm and dry NEUROLOGIC:  Alert and oriented x 3 PSYCHIATRIC:  Normal affect   ASSESSMENT:    1. Coronary artery disease involving coronary bypass graft of native heart without angina pectoris   2. Essential hypertension   3. Abdominal aortic aneurysm (AAA) without rupture ([place])   4. Hyperlipidemia, unspecified hyperlipidemia type   5. Palpitations   6. Claudication in peripheral vascular disease ([place])    PLAN:    In order of problems listed above:  1. No symptoms to suggest ischemia.  Secondary risk prevention is covered in detail. 2. Target blood pressure 130/80 mmHg.  Reminded the importance of salt restriction and diet. 3. Abdominal ultrasound will be performed to have a baseline non-radiation related method of  follow-up. 4. Target less than 70.  Most recent LDL was under 80 in  April 5. No specific complaints. 6. Left calf discomfort, possibly claudication.  Will get bilateral lower extremity Doppler evaluation.  Guideline directed therapy for left ventricular systolic dysfunction: Angiotensin receptor-neprilysin inhibitor (ARNI)-Entresto; beta-blocker therapy - carvedilol or metoprolol succinate; mineralocorticoid receptor antagonist (MRA) therapy -spironolactone or eplerenone.  These therapies have been shown to improve clinical outcomes including reduction of rehospitalization survival, and acute heart failure.    Medication Adjustments/Labs and Tests Ordered: Current medicines are reviewed at length with the patient today.  Concerns regarding medicines are outlined above.  No orders of the defined types were placed in this encounter.  No orders of the defined types were placed in this encounter.   There are no Patient Instructions on file for this visit.   Signed, Sinclair Grooms, MD  11/22/2018 9:41 AM    [place]

## 2018-11-22 ENCOUNTER — Ambulatory Visit: Payer: Medicare Other | Admitting: Interventional Cardiology

## 2018-11-22 ENCOUNTER — Encounter: Payer: Self-pay | Admitting: Interventional Cardiology

## 2018-11-22 VITALS — BP 138/76 | HR 58 | Ht 73.0 in | Wt 183.0 lb

## 2018-11-22 DIAGNOSIS — R002 Palpitations: Secondary | ICD-10-CM

## 2018-11-22 DIAGNOSIS — I714 Abdominal aortic aneurysm, without rupture, unspecified: Secondary | ICD-10-CM

## 2018-11-22 DIAGNOSIS — I10 Essential (primary) hypertension: Secondary | ICD-10-CM | POA: Diagnosis not present

## 2018-11-22 DIAGNOSIS — E785 Hyperlipidemia, unspecified: Secondary | ICD-10-CM | POA: Diagnosis not present

## 2018-11-22 DIAGNOSIS — I739 Peripheral vascular disease, unspecified: Secondary | ICD-10-CM

## 2018-11-22 DIAGNOSIS — I25810 Atherosclerosis of coronary artery bypass graft(s) without angina pectoris: Secondary | ICD-10-CM | POA: Diagnosis not present

## 2018-11-22 NOTE — Patient Instructions (Signed)
Medication Instructions:  Your physician recommends that you continue on your current medications as directed. Please refer to the Current Medication list given to you today.  If you need a refill on your cardiac medications before your next appointment, please call your pharmacy.   Lab work: None If you have labs (blood work) drawn today and your tests are completely normal, you will receive your results only by: [name] Kitchen MyChart Message (if you have MyChart) OR . A paper copy in the mail If you have any lab test that is abnormal or we need to change your treatment, we will call you to review the results.  Testing/Procedures: Your physician has requested that you have an abdominal aorta duplex. During this test, an ultrasound is used to evaluate the aorta. Allow 30 minutes for this exam. Do not eat after midnight the day before and avoid carbonated beverages  Your physician has requested that you have a lower or upper extremity arterial duplex. This test is an ultrasound of the arteries in the legs or arms. It looks at arterial blood flow in the legs and arms. Allow one hour for Lower and Upper Arterial scans. There are no restrictions or special instructions  Follow-Up: At [facility], you and your health needs are our priority.  As part of our continuing mission to provide you with exceptional heart care, we have created designated Provider Care Teams.  These Care Teams include your primary Cardiologist (physician) and Advanced Practice Providers (APPs -  Physician Assistants and Nurse Practitioners) who all work together to provide you with the care you need, when you need it. You will need a follow up appointment in 12 months.  Please call our office 2 months in advance to schedule this appointment.  You may see Dr. [name] or one of the following Advanced Practice Providers on your designated Care Team:   Truitt Merle, NP Cecilie Kicks, NP . Kathyrn Drown, NP  Any Other Special Instructions  Will Be Listed Below (If Applicable).

## 2018-11-23 ENCOUNTER — Telehealth: Payer: Self-pay | Admitting: Endocrinology

## 2018-11-23 NOTE — Telephone Encounter (Signed)
Patients wife called stating she would like to speak with the office manager. Did not state why.

## 2018-11-24 NOTE — Telephone Encounter (Signed)
Spoke with patients wife she is asking for the 2 NS fees to be removed from the patient's account, a request was sent a while back for the removal of the 08/17/18 incident so I will be resending another note to charge correction for both 08/17/18 and 04/13/18 with hopes this will be addressed this time

## 2018-11-25 ENCOUNTER — Ambulatory Visit: Payer: Medicare Other

## 2018-11-30 ENCOUNTER — Other Ambulatory Visit (HOSPITAL_COMMUNITY): Payer: Self-pay | Admitting: Interventional Cardiology

## 2018-11-30 DIAGNOSIS — I739 Peripheral vascular disease, unspecified: Secondary | ICD-10-CM

## 2018-12-06 ENCOUNTER — Other Ambulatory Visit: Payer: Self-pay | Admitting: Family Medicine

## 2018-12-07 ENCOUNTER — Inpatient Hospital Stay (HOSPITAL_COMMUNITY): Admission: RE | Admit: 2018-12-07 | Payer: Medicare Other | Source: Ambulatory Visit

## 2018-12-07 ENCOUNTER — Other Ambulatory Visit (HOSPITAL_COMMUNITY): Payer: Medicare Other

## 2018-12-10 ENCOUNTER — Encounter: Payer: Self-pay | Admitting: Family Medicine

## 2018-12-10 ENCOUNTER — Ambulatory Visit (INDEPENDENT_AMBULATORY_CARE_PROVIDER_SITE_OTHER): Payer: Medicare Other | Admitting: Family Medicine

## 2018-12-10 VITALS — BP 136/80 | HR 68 | Temp 97.7°F | Ht 73.0 in | Wt 185.4 lb

## 2018-12-10 DIAGNOSIS — B9789 Other viral agents as the cause of diseases classified elsewhere: Secondary | ICD-10-CM | POA: Diagnosis not present

## 2018-12-10 DIAGNOSIS — J329 Chronic sinusitis, unspecified: Secondary | ICD-10-CM | POA: Diagnosis not present

## 2018-12-10 NOTE — Progress Notes (Signed)
PCP: [name], MD  Subjective:  Timothy Gallagher is a 83 y.o. year old very pleasant male patient who presents with sinusitis symptoms including nasal congestion, sinus tenderness -other symptoms include: Clear mixed with bloody discharge from his nose.  He also get a similar amount from his throat-the blood really scared him -day of illness: At least #7 -Symptoms are improving-once he had the discharge from last night cleared-he has felt much better -previous treatments: Tylenol for sinus pressure -sick contacts/travel/risks: denies flu exposure.   ROS-denies fever, SOB, nausea, vomiting, diarrhea, tooth pain  Pertinent Past Medical History-  Patient Active Problem List   Diagnosis Date Noted  . Hyperthyroidism 01/13/2018    Priority: High  . Detached retina, left 06/30/2017    Priority: High  . AAA (abdominal aortic aneurysm) ([place]) 05/12/2017    Priority: High  . CAD (coronary artery disease) of artery bypass graft 05/19/2014    Priority: High  . BPPV (benign paroxysmal positional vertigo) 09/01/2016    Priority: Medium  . Insomnia 11/22/2014    Priority: Medium  . Anemia 07/20/2014    Priority: Medium  . BPH (benign prostatic hyperplasia) 12/05/2013    Priority: Medium  . Hyperlipidemia 03/13/2008    Priority: Medium  . B12 deficiency 06/07/2007    Priority: Medium  . Essential hypertension 04/09/2007    Priority: Medium  . Left sided abdominal pain 04/15/2017    Priority: Low  . Low back pain 02/06/2017    Priority: Low  . Anal fissure 04/07/2016    Priority: Low  . Internal and external bleeding hemorrhoids 01/22/2015    Priority: Low  . Palpitations 02/11/2013    Priority: Low  . OSTEOARTHRITIS, WRIST, RIGHT 08/05/2010    Priority: Low  . ACNE ROSACEA 06/27/2009    Priority: Low  . ESOPHAGEAL STRICTURE 04/27/2009    Priority: Low  . ACTINIC KERATOSIS, HEAD 04/18/2009    Priority: Low  . NECK PAIN 09/14/2008    Priority: Low  . NEUROPATHY,  IDIOPATHIC PERIPHERAL NEC 08/11/2007    Priority: Low  . Irritable bowel syndrome 08/11/2007    Priority: Low  . ALLERGIC RHINITIS 04/09/2007    Priority: Low  . Chest pain 03/06/2018    Medications- reviewed  Current Outpatient Medications  Medication Sig Dispense Refill  . acetaminophen (TYLENOL) 500 MG tablet Take 500 mg by mouth daily as needed (for pain). Reported on 04/11/2016    . aspirin 81 MG EC tablet Take 1 tablet (81 mg total) by mouth daily. (Patient taking differently: Take 81 mg by mouth every evening. )    . atorvastatin (LIPITOR) 20 MG tablet TAKE 1 TABLET(20 MG) BY MOUTH DAILY 90 tablet 3  . fluticasone (FLONASE) 50 MCG/ACT nasal spray SHAKE LIQUID AND USE 1 TO 2 SPRAYS IN EACH NOSTRIL DAILY AS NEEDED FOR ALLERGIES 16 g 5  . methimazole (TAPAZOLE) 10 MG tablet Take 1 tablet (10 mg total) by mouth daily. 30 tablet 3  . mirabegron ER (MYRBETRIQ) 50 MG TB24 tablet Take 50 mg by mouth daily.    . Multiple Vitamins-Minerals (CENTRUM SILVER PO) Take 1 tablet by mouth daily.    [name] Kitchen zolpidem (AMBIEN) 5 MG tablet TAKE 1 TABLET BY MOUTH AT BEDTIME AS NEEDED FOR SLEEP 30 tablet 5   No current facility-administered medications for this visit.     Objective: BP 136/80 (BP Location: Left Arm, Patient Position: Sitting, Cuff Size: Large)   Pulse 68   Temp 97.7 F (36.5 C) (Oral)  Ht 6\' 1"  (1.854 m)   Wt 185 lb 6.4 oz (84.1 kg)   SpO2 96%   BMI 24.46 kg/m  Gen: NAD, resting comfortably HEENT: Turbinates erythematous with yellow drainage- no bloody areas noted, TM normal, pharynx mildly erythematous with no tonsilar exudate or edema, no sinus tenderness CV: RRR no murmurs rubs or gallops Lungs: CTAB no crackles, wheeze, rhonchi Ext: no edema Skin: warm, dry, no rash Neuro: grossly normal, moves all extremities  Assessment/Plan:  Sinus infection/Sinusitis Viral based on <10 days, no double sickening, lack of severity of symptoms in first 3 days. Educated on signs that  bacterial infection may have developed (symptoms over 10 days, double sickening).   My only concern with this diagnosis is that he did have a bacterial sinusitis earlier in January-it did seem to that completely cleared.  Since he is improving today we opted to continue to monitor through Monday-if he fails to continue to improve would send in a longer course 10 days and likely try cefdinir 300 mg twice a day instead of doxycycline again.  Finally, we reviewed reasons to return to care including if symptoms worsen or persist  (despite above treatments) or new concerns arise (particularly fever or shortness of breath)  [name], MD

## 2018-12-10 NOTE — Patient Instructions (Addendum)
I think your nasal congestion is due to a virus and finally starting to break up- glad you got some good stuff out last night. Common to have some light amount of blood/nasal irritation.   Let's wait until Monday to see how you are doing as it seems like you are getting slightly better- if not continuing to improve by Monday- I will send in 10 days of doxycyline  Let us know if you continue to have blood mixed with your sinus congestion by Monday as well

## 2018-12-13 ENCOUNTER — Telehealth: Payer: Self-pay | Admitting: Family Medicine

## 2018-12-13 NOTE — Telephone Encounter (Signed)
Copied from [place] [id number]. Topic: Quick Communication - See Telephone Encounter >> Dec 13, 2018  8:37 AM Reyne Dumas L wrote: CRM for notification. See Telephone encounter for: 12/13/18.  Pt states he was supposed to call with an update today.  Pt states he is doing well and no need for medication.

## 2018-12-13 NOTE — Telephone Encounter (Signed)
See note

## 2018-12-13 NOTE — Telephone Encounter (Signed)
Noted  

## 2018-12-14 ENCOUNTER — Ambulatory Visit: Payer: Self-pay | Admitting: Podiatry

## 2018-12-16 ENCOUNTER — Ambulatory Visit: Payer: Medicare Other | Admitting: Podiatry

## 2018-12-16 ENCOUNTER — Encounter: Payer: Self-pay | Admitting: Podiatry

## 2018-12-16 DIAGNOSIS — Q828 Other specified congenital malformations of skin: Secondary | ICD-10-CM | POA: Diagnosis not present

## 2018-12-16 NOTE — Progress Notes (Signed)
He presents today with a chief complaint of painful area to the plantar aspect of the right heel.  He states that he is been doing very well for a long time but now it has recurred.  Objective: Vital signs are stable alert and oriented x3.  Pulses are palpable.  He has no pain on palpation medial calcaneal tubercle though he does have a reactive hyperkeratotic lesion and porokeratotic lesion at the same site of palpation.  There is no open lesions or wounds.  Assessment: Porokeratotic lesion plantar aspect of the right heel.  Plan: Mechanical resection today and then chemical destruction under lesion salicylic salicylic acid was placed under occlusion to be washed off thoroughly in the next 3 days.  I will follow-up with him in 6 weeks

## 2018-12-21 ENCOUNTER — Ambulatory Visit (INDEPENDENT_AMBULATORY_CARE_PROVIDER_SITE_OTHER): Payer: Medicare Other

## 2018-12-21 DIAGNOSIS — E538 Deficiency of other specified B group vitamins: Secondary | ICD-10-CM

## 2018-12-21 MED ORDER — CYANOCOBALAMIN 1000 MCG/ML IJ SOLN
1000.0000 ug | Freq: Once | INTRAMUSCULAR | Status: AC
  Administered 2018-12-21: 1000 ug via INTRAMUSCULAR

## 2018-12-21 NOTE — Progress Notes (Signed)
Per orders of Dr. Hunter, injection of vitamin B12 1000 mcg given in right deltoid by Dontavian Marchi, CMA.  Patient tolerated injection well.  

## 2018-12-24 DIAGNOSIS — R350 Frequency of micturition: Secondary | ICD-10-CM | POA: Diagnosis not present

## 2018-12-24 DIAGNOSIS — N320 Bladder-neck obstruction: Secondary | ICD-10-CM | POA: Diagnosis not present

## 2018-12-28 ENCOUNTER — Ambulatory Visit: Payer: Medicare Other | Admitting: Family Medicine

## 2018-12-31 ENCOUNTER — Ambulatory Visit (HOSPITAL_COMMUNITY)
Admission: RE | Admit: 2018-12-31 | Discharge: 2018-12-31 | Disposition: A | Discharge status: Home / Self Care | Payer: Medicare Other | Source: Ambulatory Visit | Attending: Cardiology | Admitting: Cardiology

## 2018-12-31 ENCOUNTER — Ambulatory Visit (HOSPITAL_BASED_OUTPATIENT_CLINIC_OR_DEPARTMENT_OTHER)
Admission: RE | Admit: 2018-12-31 | Discharge: 2018-12-31 | Disposition: A | Discharge status: Home / Self Care | Payer: Medicare Other | Source: Ambulatory Visit | Attending: Interventional Cardiology | Admitting: Interventional Cardiology

## 2018-12-31 DIAGNOSIS — I714 Abdominal aortic aneurysm, without rupture, unspecified: Secondary | ICD-10-CM

## 2018-12-31 DIAGNOSIS — I739 Peripheral vascular disease, unspecified: Secondary | ICD-10-CM | POA: Diagnosis not present

## 2019-01-03 ENCOUNTER — Ambulatory Visit: Payer: Medicare Other | Admitting: Endocrinology

## 2019-01-03 ENCOUNTER — Telehealth: Payer: Self-pay | Admitting: *Deleted

## 2019-01-03 DIAGNOSIS — I714 Abdominal aortic aneurysm, without rupture, unspecified: Secondary | ICD-10-CM

## 2019-01-03 NOTE — Telephone Encounter (Signed)
DPR ok to s/w pt's wife. Pt's wife has been notified of both test results by phone with verbal understanding. Pt's wife is aware we will repeat AAA U/S in 1 year. I did advise if pt has any questions please call the office [phone]. I will place order for the AAA U/S.

## 2019-01-03 NOTE — Telephone Encounter (Signed)
-----   Message from Belva Crome, MD sent at 01/01/2019 12:24 PM EST ----- Let the patient know aorta is unchanged c/w 2018. Repeat duplex in 1 year. A copy will be sent to [name], MD

## 2019-01-07 ENCOUNTER — Telehealth: Payer: Self-pay | Admitting: Family Medicine

## 2019-01-07 NOTE — Telephone Encounter (Signed)
Timothy Gallagher- can you please schedule pt for January 19 2019 11:20pm  same day for left side pain. Pt was advised to call us back if sx worsen.

## 2019-01-07 NOTE — Telephone Encounter (Signed)
Copied from [place] [id number]. Topic: General - Inquiry >> Jan 07, 2019  8:34 AM Margot Ables wrote: Reason for CRM: pain in left side under ribs x 1-2 weeks, it eases off after he is up and moving around but it starts back a while after he lays down, pt requesting appt with [name] on 3/4 in the morning (he is scheduled for a B12 inj with nurse at 9:30am 3/4). Please call to advise and appt made, pt may not be home so please leave detailed msg.

## 2019-01-07 NOTE — Telephone Encounter (Signed)
Noted! Will route and verbally discuss with [name].

## 2019-01-07 NOTE — Telephone Encounter (Signed)
That's fine- can use same day slot- would be better if we could move to another day that week if I have a non same day slot available- we could just give b12 on that day  He should be seen sooner with new or worsening symptoms

## 2019-01-12 NOTE — Telephone Encounter (Signed)
Patient has been contacted and scheduled and understands he will have his b12 also at his visit.

## 2019-01-12 NOTE — Telephone Encounter (Signed)
Noted Thanks you

## 2019-01-19 ENCOUNTER — Ambulatory Visit (INDEPENDENT_AMBULATORY_CARE_PROVIDER_SITE_OTHER): Payer: Medicare Other | Admitting: Family Medicine

## 2019-01-19 ENCOUNTER — Ambulatory Visit: Payer: Medicare Other

## 2019-01-19 ENCOUNTER — Encounter: Payer: Self-pay | Admitting: Family Medicine

## 2019-01-19 ENCOUNTER — Ambulatory Visit (INDEPENDENT_AMBULATORY_CARE_PROVIDER_SITE_OTHER): Payer: Medicare Other

## 2019-01-19 ENCOUNTER — Telehealth: Payer: Self-pay | Admitting: Family Medicine

## 2019-01-19 DIAGNOSIS — E538 Deficiency of other specified B group vitamins: Secondary | ICD-10-CM

## 2019-01-19 MED ORDER — CYANOCOBALAMIN 1000 MCG/ML IJ SOLN
1000.0000 ug | Freq: Once | INTRAMUSCULAR | Status: AC
  Administered 2019-01-19: 1000 ug via INTRAMUSCULAR

## 2019-01-19 NOTE — Patient Instructions (Signed)
There are no preventive care reminders to display for this patient.  Depression screen [facility] 2/9 06/14/2018 04/13/2018 04/07/2017  Decreased Interest 0 0 0  Down, Depressed, Hopeless 0 0 0  PHQ - 2 Score 0 0 0  Some recent data might be hidden

## 2019-01-19 NOTE — Telephone Encounter (Signed)
Received request from patient for refill on methimazole. From Dr. Cordelia Pen last note he recommended 4 month follow up. Per patient- he has been released. I am going to recommend he call to schedule follow up with Dr. Loanne Drilling- will cc Dr. Loanne Drilling and team please let patient know to call for follow up. If Dr. Loanne Drilling prefers a different plan from his last note- please let us know.

## 2019-01-19 NOTE — Telephone Encounter (Signed)
The refill request should be routed to me.  He was advised to sched f/u here, but has apparently declined.  He can have 1 refill by me if he wants to f/u here.

## 2019-01-19 NOTE — Progress Notes (Signed)
Per orders of [name], injection of  b12 given in Left deltoid by Franco Collet. Patient tolerated injection well.

## 2019-01-20 NOTE — Progress Notes (Signed)
Patient's symptoms had resolved by time of visit and we converted to b12 injection/nurse visit alone.

## 2019-01-20 NOTE — Patient Instructions (Signed)
There are no preventive care reminders to display for this patient.  Depression screen [facility] 2/9 06/14/2018 04/13/2018 04/07/2017  Decreased Interest 0 0 0  Down, Depressed, Hopeless 0 0 0  PHQ - 2 Score 0 0 0  Some recent data might be hidden

## 2019-01-26 ENCOUNTER — Encounter: Payer: Self-pay | Admitting: Endocrinology

## 2019-01-26 ENCOUNTER — Other Ambulatory Visit: Payer: Self-pay

## 2019-01-26 ENCOUNTER — Ambulatory Visit (INDEPENDENT_AMBULATORY_CARE_PROVIDER_SITE_OTHER): Payer: Medicare Other | Admitting: Endocrinology

## 2019-01-26 VITALS — BP 140/74 | HR 74 | Ht 73.0 in | Wt 188.2 lb

## 2019-01-26 DIAGNOSIS — E0590 Thyrotoxicosis, unspecified without thyrotoxic crisis or storm: Secondary | ICD-10-CM

## 2019-01-26 LAB — TSH: TSH: 16.81 u[IU]/mL — ABNORMAL HIGH (ref 0.35–4.50)

## 2019-01-26 LAB — T4, FREE: Free T4: 0.68 ng/dL (ref 0.60–1.60)

## 2019-01-26 MED ORDER — METHIMAZOLE 5 MG PO TABS: 5.0000 mg | ORAL_TABLET | Freq: Every day | ORAL | 3 refills | Status: DC

## 2019-01-26 NOTE — Patient Instructions (Addendum)
Your blood pressure is high today.  Please see your primary care provider soon, to have it rechecked blood tests are requested for you today.  We'll let you know about the results. If ever you have fever while taking methimazole, stop it and call us, even if the reason is obvious, because of the risk of a rare side-effect.   Please come back for a follow-up appointment in 6 months.

## 2019-01-26 NOTE — Progress Notes (Signed)
Subjective:    Patient ID: Timothy Gallagher, male    DOB: 10/19/1936, 83 y.o.   MRN: [id number]  HPI Pt returns for f/u of hyperthyroidism (dx'ed early 2019; TFT improved initially, but recurred in July, 2019, and was started on tapazole; he has never had thyroid imaging; he takes toprol for CAD, not thyroid; he declines RAI).  pt states he feels wel in general.   Past Medical History:  Diagnosis Date  . ACNE ROSACEA 06/27/2009  . ACTINIC KERATOSIS, HEAD 04/18/2009  . Acute maxillary sinusitis 05/14/2010  . ALLERGIC RHINITIS 04/09/2007  . Anal fissure 04/07/2016  . B12 DEFICIENCY 06/07/2007  . BACK PAIN WITH RADICULOPATHY 04/24/2008  . Cancer ([place])    skin  . CHEST WALL PAIN, ACUTE 06/15/2009  . Chronic maxillary sinusitis 05/29/2008  . COLITIS 04/27/2009  . COLONIC POLYPS, HX OF 04/27/2009   tubular adenomas  . Coronary artery disease 05/12/2014   Cath 05/12/2014 w/ severe 3-vessel CAD and preserved LV function, EF 55%  . DERMATITIS, ATOPIC 10/12/2007  . DIVERTICULOSIS, COLON 04/27/2009  . ECCHYMOSES, SPONTANEOUS 06/27/2008  . Elevated sedimentation rate 05/02/2009  . ESOPHAGEAL STRICTURE 04/27/2009  . GASTRITIS, CHRONIC 04/27/2009  . HYPERLIPIDEMIA 03/13/2008  . HYPERTENSION 04/09/2007  . Internal bleeding hemorrhoids 01/22/2015   01/22/2015 Seen at anoscopy, grade 1 all 3 positions   . Irritable bowel syndrome 08/11/2007  . KIDNEY DISEASE 04/09/2007  . NECK PAIN 09/14/2008  . NEUROPATHY, IDIOPATHIC PERIPHERAL NEC 08/11/2007  . OSTEOARTHRITIS, WRIST, RIGHT 08/05/2010  . Postoperative delirium 05/20/2014  . S/P CABG x 4 05/19/2014   LIMA to LAD, SVG to diag, SVG to OM, SVG to PDA, EVH via right thigh and leg    Past Surgical History:  Procedure Laterality Date  . CARDIAC CATHETERIZATION    . COLONOSCOPY    . CORONARY ARTERY BYPASS GRAFT N/A 05/19/2014   Procedure: CORONARY ARTERY BYPASS GRAFTING (CABG);  Surgeon: Rexene Alberts, MD;  Location: [place];  Service: Open Heart Surgery;  Laterality: N/A;   Times 4 using left internal mammary artery and endoscopically harvested right saphenous vein  . ESOPHAGOGASTRODUODENOSCOPY    . FINGER SURGERY     cut off end of finger  . FLEXIBLE SIGMOIDOSCOPY    . HEMORRHOID BANDING    . HERNIA REPAIR    . INCISION AND DRAINAGE WOUND WITH FOREIGN BODY REMOVAL Left 12/20/2013   Procedure: INCISION AND DRAINAGE LEFT INDEX FINGER;  Surgeon: Tennis Must, MD;  Location: [facility];  Service: Orthopedics;  Laterality: Left;  . INTRAOPERATIVE TRANSESOPHAGEAL ECHOCARDIOGRAM N/A 05/19/2014   Procedure: INTRAOPERATIVE TRANSESOPHAGEAL ECHOCARDIOGRAM;  Surgeon: Rexene Alberts, MD;  Location: [place];  Service: Open Heart Surgery;  Laterality: N/A;  . lamenectomy    . LEFT HEART CATHETERIZATION WITH CORONARY ANGIOGRAM N/A 05/12/2014   Procedure: LEFT HEART CATHETERIZATION WITH CORONARY ANGIOGRAM;  Surgeon: Sinclair Grooms, MD;  Location[facility];  Service: Cardiovascular;  Laterality: N/A;  . LUMBAR FUSION    . TONSILLECTOMY    . VARICOSE VEIN SURGERY Left     Social History   Socioeconomic History  . Marital status: Married    Spouse name: Not on file  . Number of children: 4  . Years of education: Not on file  . Highest education level: Not on file  Occupational History  . Occupation: retired    Fish farm manager: RETIRED    Comment: from [employer]  . Financial resource strain: Not on file  .  Food insecurity:    Worry: Not on file    Inability: Not on file  . Transportation needs:    Medical: Not on file    Non-medical: Not on file  Tobacco Use  . Smoking status: Former Smoker    Packs/day: 0.50    Years: 42.00    Pack years: 21.00    Types: Cigarettes    Last attempt to quit: 11/17/1998    Years since quitting: 20.2  . Smokeless tobacco: Never Used  Substance and Sexual Activity  . Alcohol use: No  . Drug use: No  . Sexual activity: Yes  Lifestyle  . Physical activity:    Days per week: Not on file    Minutes per session: Not on file   . Stress: Not on file  Relationships  . Social connections:    Talks on phone: Not on file    Gets together: Not on file    Attends religious service: Not on file    Active member of club or organization: Not on file    Attends meetings of clubs or organizations: Not on file    Relationship status: Not on file  . Intimate partner violence:    Fear of current or ex partner: Not on file    Emotionally abused: Not on file    Physically abused: Not on file    Forced sexual activity: Not on file  Other Topics Concern  . Not on file  Social History Narrative   Married 35 years (2nd marriage). 2 kids from previous marriage. 2 stepchildren. 8 grandkids.       Retired from [employer]: walking/exercise, building in shop-furniture (cut finger off in February doing this)   6 grand children - Has bought them all a new car    Brother died of probably addiction   Sister also died; not sure of cause    He enjoys his life; Likes to play the keyboard; guitar    Had played in a gospel quartet     Current Outpatient Medications on File Prior to Visit  Medication Sig Dispense Refill  . acetaminophen (TYLENOL) 500 MG tablet Take 500 mg by mouth daily as needed (for pain). Reported on 04/11/2016    . aspirin 81 MG EC tablet Take 1 tablet (81 mg total) by mouth daily. (Patient taking differently: Take 81 mg by mouth every evening. )    . atorvastatin (LIPITOR) 20 MG tablet TAKE 1 TABLET(20 MG) BY MOUTH DAILY 90 tablet 3  . fluticasone (FLONASE) 50 MCG/ACT nasal spray SHAKE LIQUID AND USE 1 TO 2 SPRAYS IN EACH NOSTRIL DAILY AS NEEDED FOR ALLERGIES 16 g 5  . mirabegron ER (MYRBETRIQ) 50 MG TB24 tablet Take 50 mg by mouth daily.    . Multiple Vitamins-Minerals (CENTRUM SILVER PO) Take 1 tablet by mouth daily.    . tamsulosin (FLOMAX) 0.4 MG CAPS capsule TK 1 C PO QD    . zolpidem (AMBIEN) 5 MG tablet TAKE 1 TABLET BY MOUTH AT BEDTIME AS NEEDED FOR SLEEP 30 tablet 5   No  current facility-administered medications on file prior to visit.     Allergies  Allergen Reactions  . Lipitor [Atorvastatin] Other (See Comments)    REACTION: nausea and blurred vision  . Trazodone And Nefazodone Other (See Comments)    dizzy  . Ciprofloxacin Swelling  . Mycophenolate Mofetil Other (See Comments)    REACTION: unspecified  . Amoxicillin Rash  Has patient had a PCN reaction causing immediate rash, facial/tongue/throat swelling, SOB or lightheadedness with hypotension: Unknown Has patient had a PCN reaction causing severe rash involving mucus membranes or skin necrosis: Unknown Has patient had a PCN reaction that required hospitalization: Unknown Has patient had a PCN reaction occurring within the last 10 years: Unknown If all of the above answers are "NO", then may proceed with Cephalosporin use.   [name] Kitchen Penicillins Rash    Has patient had a PCN reaction causing immediate rash, facial/tongue/throat swelling, SOB or lightheadedness with hypotension: Unknown Has patient had a PCN reaction causing severe rash involving mucus membranes or skin necrosis: Unknown Has patient had a PCN reaction that required hospitalization: Unknown Has patient had a PCN reaction occurring within the last 10 years: Unknown If all of the above answers are "NO", then may proceed with Cephalosporin use.   . Rosuvastatin Other (See Comments)    Unknown     Family History  Problem Relation Age of Onset  . Hernia Mother   . Heart disease Father        smoker  . COPD Father   . Heart attack Brother   . Early death Daughter   . Colon cancer Neg Hx     BP 140/74 (BP Location: Left Arm, Patient Position: Sitting, Cuff Size: Normal)   Pulse 74   Ht 6\' 1"  (1.854 m)   Wt 188 lb 3.2 oz (85.4 kg)   SpO2 97%   BMI 24.83 kg/m     Review of Systems He denies fever.      Objective:   Physical Exam VITAL SIGNS:  See vs page GENERAL: no distress NECK: There is no palpable thyroid  enlargement.  No thyroid nodule is palpable.  No palpable lymphadenopathy at the anterior neck.   Lab Results  Component Value Date   TSH 16.81 (H) 01/26/2019      Assessment & Plan:  HTN: is noted today Hyperthyroidism: overcontrolled.  stay off tapazole x 1 week, then resume at 5 mg daily.    Patient Instructions  Your blood pressure is high today.  Please see your primary care provider soon, to have it rechecked blood tests are requested for you today.  We'll let you know about the results. If ever you have fever while taking methimazole, stop it and call us, even if the reason is obvious, because of the risk of a rare side-effect.   Please come back for a follow-up appointment in 6 months.

## 2019-01-27 ENCOUNTER — Ambulatory Visit: Payer: Medicare Other | Admitting: Podiatry

## 2019-02-22 ENCOUNTER — Ambulatory Visit: Payer: Medicare Other | Admitting: Family Medicine

## 2019-02-22 ENCOUNTER — Ambulatory Visit: Payer: Medicare Other

## 2019-03-11 ENCOUNTER — Ambulatory Visit: Payer: Medicare Other | Admitting: Family Medicine

## 2019-03-14 ENCOUNTER — Ambulatory Visit: Payer: Medicare Other | Admitting: Endocrinology

## 2019-03-16 ENCOUNTER — Telehealth: Payer: Self-pay | Admitting: Interventional Cardiology

## 2019-03-16 NOTE — Patient Instructions (Addendum)
There are no preventive care reminders to display for this patient.  Depression screen [facility] 2/9 06/14/2018 04/13/2018 04/07/2017  Decreased Interest 0 0 0  Down, Depressed, Hopeless 0 0 0  PHQ - 2 Score 0 0 0  Some recent data might be hidden   Phone visit

## 2019-03-16 NOTE — Telephone Encounter (Signed)
Called patient back. Patient stated for about a couple of weeks he had some hypotension here and there, with some dizziness and weakness. Patient stated it usually goes away after a while. Patient is not on any BP medications. Encouraged patient to drink some fluids and have a small salty snack the next time this happens and see if that helps. Patient stated he drinks a lot of tea and he eats fine. Encouraged patient to call his PCP as well for advisement since he was recently dx with hyperthyroidism and started on medication for this. Will send message to Dr. [name] for further advisement.

## 2019-03-16 NOTE — Telephone Encounter (Signed)
New message   Pt c/o BP issue: STAT if pt c/o blurred vision, one-sided weakness or slurred speech  1. What are your last 5 BP readings? 93/66 103 hr   109/72 102 hr  These readings were for today  2. Are you having any other symptoms (ex. Dizziness, headache, blurred vision, passed out)? Dizziness and weakness  3. What is your BP issue? Patient states that his b/p is dropping

## 2019-03-17 ENCOUNTER — Ambulatory Visit (INDEPENDENT_AMBULATORY_CARE_PROVIDER_SITE_OTHER): Payer: Medicare Other | Admitting: Family Medicine

## 2019-03-17 ENCOUNTER — Encounter: Payer: Self-pay | Admitting: Family Medicine

## 2019-03-17 VITALS — BP 118/70 | HR 90 | Ht 73.0 in | Wt 188.0 lb

## 2019-03-17 DIAGNOSIS — I10 Essential (primary) hypertension: Secondary | ICD-10-CM

## 2019-03-17 DIAGNOSIS — E538 Deficiency of other specified B group vitamins: Secondary | ICD-10-CM | POA: Diagnosis not present

## 2019-03-17 DIAGNOSIS — N401 Enlarged prostate with lower urinary tract symptoms: Secondary | ICD-10-CM

## 2019-03-17 DIAGNOSIS — G4700 Insomnia, unspecified: Secondary | ICD-10-CM | POA: Diagnosis not present

## 2019-03-17 DIAGNOSIS — Z7189 Other specified counseling: Secondary | ICD-10-CM

## 2019-03-17 DIAGNOSIS — R351 Nocturia: Secondary | ICD-10-CM

## 2019-03-17 MED ORDER — ZOLPIDEM TARTRATE 5 MG PO TABS: 5.0000 mg | ORAL_TABLET | Freq: Every evening | ORAL | 5 refills | Status: DC | PRN

## 2019-03-17 NOTE — Progress Notes (Deleted)
Phone [phone]   Subjective:  Virtual visit via phonenote Chief Complaint  Patient presents with  . Hypertension    This visit type was conducted due to national recommendations for restrictions regarding the COVID-19 Pandemic (e.g. social distancing).  This format is felt to be most appropriate for this patient at this time balancing risks to patient and risks to population by having him in for in person visit.  All issues noted in this document were discussed and addressed.  No physical exam was performed (except for noted visual exam or audio findings with Telehealth visits).  The patient has consented to conduct a Telehealth visit and understands insurance will be billed.   Our team/I connected with [name] on 03/17/19 at  8:00 AM EDT by phone (patient did not have equipment for webex) and verified that I am speaking with the correct person using two identifiers.  Location patient: [facility]*** Location provider: [facility], office*** Persons participating in the virtual visit: *** patient  Time on phone: *** minutes Counseling provided about ***  Our team/I discussed the limitations of evaluation and management by telemedicine and the availability of in person appointments. In light of current covid-19 pandemic, patient also understands that we are trying to protect them by minimizing in office contact if at all possible.  The patient expressed consent for telemedicine visit and agreed to proceed. Patient understands insurance will be billed.   ROS- ***   Past Medical History-  Patient Active Problem List   Diagnosis Date Noted  . Chest pain 03/06/2018  . Hyperthyroidism 01/13/2018  . Detached retina, left 06/30/2017  . AAA (abdominal aortic aneurysm) ([place]) 05/12/2017  . Left sided abdominal pain 04/15/2017  . Low back pain 02/06/2017  . BPPV (benign paroxysmal positional vertigo) 09/01/2016  . Anal fissure 04/07/2016  . Internal and external bleeding hemorrhoids  01/22/2015  . Insomnia 11/22/2014  . Anemia 07/20/2014  . CAD (coronary artery disease) of artery bypass graft 05/19/2014  . BPH (benign prostatic hyperplasia) 12/05/2013  . Palpitations 02/11/2013  . OSTEOARTHRITIS, WRIST, RIGHT 08/05/2010  . ACNE ROSACEA 06/27/2009  . ESOPHAGEAL STRICTURE 04/27/2009  . ACTINIC KERATOSIS, HEAD 04/18/2009  . NECK PAIN 09/14/2008  . Hyperlipidemia 03/13/2008  . NEUROPATHY, IDIOPATHIC PERIPHERAL NEC 08/11/2007  . Irritable bowel syndrome 08/11/2007  . B12 deficiency 06/07/2007  . Essential hypertension 04/09/2007  . ALLERGIC RHINITIS 04/09/2007    Medications- reviewed and updated Current Outpatient Medications  Medication Sig Dispense Refill  . acetaminophen (TYLENOL) 500 MG tablet Take 500 mg by mouth daily as needed (for pain). Reported on 04/11/2016    . aspirin 81 MG EC tablet Take 1 tablet (81 mg total) by mouth daily. (Patient taking differently: Take 81 mg by mouth every evening. )    . atorvastatin (LIPITOR) 20 MG tablet TAKE 1 TABLET(20 MG) BY MOUTH DAILY 90 tablet 3  . fluticasone (FLONASE) 50 MCG/ACT nasal spray SHAKE LIQUID AND USE 1 TO 2 SPRAYS IN EACH NOSTRIL DAILY AS NEEDED FOR ALLERGIES 16 g 5  . methimazole (TAPAZOLE) 5 MG tablet Take 1 tablet (5 mg total) by mouth daily. 30 tablet 3  . Multiple Vitamins-Minerals (CENTRUM SILVER PO) Take 1 tablet by mouth daily.    . tamsulosin (FLOMAX) 0.4 MG CAPS capsule TK 1 C PO QD    . zolpidem (AMBIEN) 5 MG tablet TAKE 1 TABLET BY MOUTH AT BEDTIME AS NEEDED FOR SLEEP 30 tablet 5  . mirabegron ER (MYRBETRIQ) 50 MG TB24 tablet Take 50 mg  by mouth daily.     No current facility-administered medications for this visit.      Objective:  BP 118/70 (BP Location: Left Arm, Patient Position: Sitting, Cuff Size: Normal)   Pulse 90   Ht 6\' 1"  (1.854 m)   Wt 188 lb (85.3 kg)   BMI 24.80 kg/m  self reported vitals *** Nonlabored voice, normal speech      Assessment and Plan   # *** S:***   A/P: ***   Future Appointments  Date Time Provider [place]  03/17/2019  8:00 AM [name], MD [facility]  07/29/2019 10:30 AM Renato Shin, MD [facility] None   No follow-ups on file.  Lab/Order associations: No diagnosis found.  No orders of the defined types were placed in this encounter.   Return precautions advised.  Brendell L Tyus, CMA

## 2019-03-17 NOTE — Progress Notes (Signed)
Phone [phone]   Subjective:  Virtual visit via phonenote Chief Complaint  Patient presents with  . Hypertension    dizzness  . Follow-up   This visit type was conducted due to national recommendations for restrictions regarding the COVID-19 Pandemic (e.g. social distancing).  This format is felt to be most appropriate for this patient at this time balancing risks to patient and risks to population by having him in for in person visit.  All issues noted in this document were discussed and addressed.  No physical exam was performed (except for noted visual exam or audio findings with Telehealth visits).  The patient has consented to conduct a Telehealth visit and understands insurance will be billed.   Our team/I connected with [name] on 03/17/19 at  8:00 AM EDT by phone (patient did not have equipment for webex) and verified that I am speaking with the correct person using two identifiers.  Location patient: [place] Location provider: [facility], office Persons participating in the virtual visit:  patient  Time on phone: 12 minutes Counseling provided about hypertension, covid 19, hypotension, BPH, UTI risks  Our team/I discussed the limitations of evaluation and management by telemedicine and the availability of in person appointments. In light of current covid-19 pandemic, patient also understands that we are trying to protect them by minimizing in office contact if at all possible.  The patient expressed consent for telemedicine visit and agreed to proceed. Patient understands insurance will be billed.   ROS- No chest pain or shortness of breath. No headache or new blurry vision (other than history left sided detached retina). Some mild dizziness.    Past Medical History-  Patient Active Problem List   Diagnosis Date Noted  . Hyperthyroidism 01/13/2018    Priority: High  . Detached retina, left 06/30/2017    Priority: High  . AAA (abdominal aortic aneurysm) ([place])  05/12/2017    Priority: High  . CAD (coronary artery disease) of artery bypass graft 05/19/2014    Priority: High  . BPPV (benign paroxysmal positional vertigo) 09/01/2016    Priority: Medium  . Insomnia 11/22/2014    Priority: Medium  . Anemia 07/20/2014    Priority: Medium  . BPH (benign prostatic hyperplasia) 12/05/2013    Priority: Medium  . Hyperlipidemia 03/13/2008    Priority: Medium  . B12 deficiency 06/07/2007    Priority: Medium  . Essential hypertension 04/09/2007    Priority: Medium  . Left sided abdominal pain 04/15/2017    Priority: Low  . Low back pain 02/06/2017    Priority: Low  . Anal fissure 04/07/2016    Priority: Low  . Internal and external bleeding hemorrhoids 01/22/2015    Priority: Low  . Palpitations 02/11/2013    Priority: Low  . OSTEOARTHRITIS, WRIST, RIGHT 08/05/2010    Priority: Low  . ACNE ROSACEA 06/27/2009    Priority: Low  . ESOPHAGEAL STRICTURE 04/27/2009    Priority: Low  . ACTINIC KERATOSIS, HEAD 04/18/2009    Priority: Low  . NECK PAIN 09/14/2008    Priority: Low  . NEUROPATHY, IDIOPATHIC PERIPHERAL NEC 08/11/2007    Priority: Low  . Irritable bowel syndrome 08/11/2007    Priority: Low  . ALLERGIC RHINITIS 04/09/2007    Priority: Low  . Chest pain 03/06/2018    Medications- reviewed and updated Current Outpatient Medications  Medication Sig Dispense Refill  . acetaminophen (TYLENOL) 500 MG tablet Take 500 mg by mouth daily as needed (for pain). Reported on 04/11/2016    .  aspirin 81 MG EC tablet Take 1 tablet (81 mg total) by mouth daily. (Patient taking differently: Take 81 mg by mouth every evening. )    . atorvastatin (LIPITOR) 20 MG tablet TAKE 1 TABLET(20 MG) BY MOUTH DAILY 90 tablet 3  . fluticasone (FLONASE) 50 MCG/ACT nasal spray SHAKE LIQUID AND USE 1 TO 2 SPRAYS IN EACH NOSTRIL DAILY AS NEEDED FOR ALLERGIES 16 g 5  . methimazole (TAPAZOLE) 5 MG tablet Take 1 tablet (5 mg total) by mouth daily. 30 tablet 3  .  Multiple Vitamins-Minerals (CENTRUM SILVER PO) Take 1 tablet by mouth daily.    . tamsulosin (FLOMAX) 0.4 MG CAPS capsule TK 1 C PO QD    . zolpidem (AMBIEN) 5 MG tablet Take 1 tablet (5 mg total) by mouth at bedtime as needed. for sleep 30 tablet 5  . mirabegron ER (MYRBETRIQ) 50 MG TB24 tablet Take 50 mg by mouth daily.     No current facility-administered medications for this visit.      Objective:  BP 118/70 (BP Location: Left Arm, Patient Position: Sitting, Cuff Size: Normal)   Pulse 90   Ht 6\' 1"  (1.854 m)   Wt 188 lb (85.3 kg)   BMI 24.80 kg/m  self reported vitals  Nonlabored voice, normal speech   Repeat 129/76    Assessment and Plan   # Hypertension/BPH/insomnia S:patient is no longer on BP medications. He still notes lightheadedness at times- wondered if it was related to blood pressure as often notes pressure into 110s over 60s or 70s. He called cardiology and they recommended drinking extra water and getting salty snack.  A/P: blood pressure is well controlled- I wonder if issues are related to his flomax but he needs to take this due to fact he has had UTIs when coming off- such as 06/2017 trial. Also discussed Lorrin Mais could potentially cause some lingering issues at least in morning hours- we discussed trialing off of that if he can still sleep. I did still refill the ambien in case this is not effective for him   # B12 deficiency/covid 19 education S:patient is fearful of getting covid 19 if leaves the home . His kids have done a great job bringing groceries to him and helping he and his wife stay home- he is to continue this.  A/P: b12 has dropped in the past off injections- we are going to have him do oral b12 1064mcg for next month or two or however long it takes for restrictions to be slowed and for him to feel safer coming to the office.    #also recently had TSH checked and some medication adjustments- he is planning on going back in 6 months- appears that's  scheduled  Future Appointments  Date Time Provider [facility]  07/29/2019 10:30 AM Renato Shin, MD [facility] None   Lab/Order associations: Essential hypertension  Benign prostatic hyperplasia with nocturia  B12 deficiency  Insomnia, unspecified type  Educated About Covid-19 Virus Infection  Meds ordered this encounter  Medications  . zolpidem (AMBIEN) 5 MG tablet    Sig: Take 1 tablet (5 mg total) by mouth at bedtime as needed. for sleep    Dispense:  30 tablet    Refill:  5    Return precautions advised.  [name], MD

## 2019-03-17 NOTE — Telephone Encounter (Signed)
Pt seen by PCP this morning

## 2019-03-17 NOTE — Addendum Note (Signed)
Addended by: [name] on: 03/17/2019 02:35 PM   Modules accepted: Level of Service

## 2019-04-27 ENCOUNTER — Ambulatory Visit: Payer: Self-pay

## 2019-04-27 NOTE — Telephone Encounter (Signed)
Tomorrow is reasonable-this has been an ongoing issue

## 2019-04-27 NOTE — Telephone Encounter (Signed)
Incoming call from Patient with a complaint of Dizziness .  Rates it mild Reports having vertigo the other day.  States that he has experienced dizziness of f and on for about a year. Aggravating factors bending down to pick up something.  Reviewed protocol and care advise with Patient .  Transferered call to [place] to Schedule appointment.  Patient voiced understanding.     Reason for Disposition . [1] MODERATE dizziness (e.g., interferes with normal activities) AND [2] has NOT been evaluated by physician for this  (Exception: dizziness caused by heat exposure, sudden standing, or poor fluid intake)  Answer Assessment - Initial Assessment Questions 1. DESCRIPTION: "Describe your dizziness."     Have to get up easy then sit doen. 2. LIGHTHEADED: "Do you feel lightheaded?" (e.g., somewhat faint, woozy, weak upon standing)     *No Answer* 3. VERTIGO: "Do you feel like either you or the room is spinning or tilting?" (i.e. vertigo)     denies 4. SEVERITY: "How bad is it?"  "Do you feel like you are going to faint?" "Can you stand and walk?"   - MILD - walking normally   - MODERATE - interferes with normal activities (e.g., work, school)    - SEVERE - unable to stand, requires support to walk, feels like passing out now.      Had vertigo the other day,  Had to lay down near half the day 5. ONSET:  "When did the dizziness begin?"    A year or more sporatically 6. AGGRAVATING FACTORS: "Does anything make it worse?" (e.g., standing, change in head position)    Benind down to pick up something.  Taking B-12 injectios 7. HEART RATE: "Can you tell me your heart rate?" "How many beats in 15 seconds?"  (Note: not all patients can do this)      81 8. CAUSE: "What do you think is causing the dizziness?"      9. RECURRENT SYMPTOM: "Have you had dizziness before?" If so, ask: "When was the last time?" "What happened that time?"     no 10. OTHER SYMPTOMS: "Do you have any other symptoms?" (e.g.,  fever, chest pain, vomiting, diarrhea, bleeding)     Denies 11. PREGNANCY: "Is there any chance you are pregnant?" "When was your last menstrual period?"      NA  Protocols used: DIZZINESS [facility]

## 2019-04-27 NOTE — Telephone Encounter (Signed)
Forwarding to Dr. Hunter as FYI.  

## 2019-04-27 NOTE — Telephone Encounter (Signed)
Patient was scheduled for tomorrow due to patient only wanting tomorrow. Please advise as needed

## 2019-04-28 ENCOUNTER — Ambulatory Visit (INDEPENDENT_AMBULATORY_CARE_PROVIDER_SITE_OTHER): Payer: Medicare Other | Admitting: Family Medicine

## 2019-04-28 ENCOUNTER — Other Ambulatory Visit: Payer: Self-pay

## 2019-04-28 ENCOUNTER — Encounter: Payer: Self-pay | Admitting: Family Medicine

## 2019-04-28 VITALS — BP 122/72 | HR 78 | Temp 98.0°F | Ht 73.0 in | Wt 182.0 lb

## 2019-04-28 DIAGNOSIS — E0590 Thyrotoxicosis, unspecified without thyrotoxic crisis or storm: Secondary | ICD-10-CM

## 2019-04-28 DIAGNOSIS — I10 Essential (primary) hypertension: Secondary | ICD-10-CM | POA: Diagnosis not present

## 2019-04-28 DIAGNOSIS — E538 Deficiency of other specified B group vitamins: Secondary | ICD-10-CM

## 2019-04-28 DIAGNOSIS — R5383 Other fatigue: Secondary | ICD-10-CM

## 2019-04-28 DIAGNOSIS — I951 Orthostatic hypotension: Secondary | ICD-10-CM

## 2019-04-28 DIAGNOSIS — E785 Hyperlipidemia, unspecified: Secondary | ICD-10-CM

## 2019-04-28 LAB — COMPREHENSIVE METABOLIC PANEL
ALT: 10 U/L (ref 0–53)
AST: 16 U/L (ref 0–37)
Albumin: 3.7 g/dL (ref 3.5–5.2)
Alkaline Phosphatase: 86 U/L (ref 39–117)
BUN: 15 mg/dL (ref 6–23)
CO2: 27 mEq/L (ref 19–32)
Calcium: 8.7 mg/dL (ref 8.4–10.5)
Chloride: 106 mEq/L (ref 96–112)
Creatinine, Ser: 0.93 mg/dL (ref 0.40–1.50)
GFR: 78.01 mL/min (ref >60.00)
Glucose, Bld: 89 mg/dL (ref 70–99)
Potassium: 4.3 mEq/L (ref 3.5–5.1)
Sodium: 140 mEq/L (ref 135–145)
Total Bilirubin: 0.5 mg/dL (ref 0.2–1.2)
Total Protein: 6.4 g/dL (ref 6.0–8.3)

## 2019-04-28 LAB — LIPID PANEL
Cholesterol: 121 mg/dL (ref 0–200)
HDL: 43.7 mg/dL (ref >39.00)
LDL Cholesterol: 59 mg/dL (ref 0–99)
NonHDL: 77.24
Total CHOL/HDL Ratio: 3
Triglycerides: 90 mg/dL (ref 0.0–149.0)
VLDL: 18 mg/dL (ref 0.0–40.0)

## 2019-04-28 LAB — VITAMIN B12: Vitamin B-12: 140 pg/mL — ABNORMAL LOW (ref 211–911)

## 2019-04-28 LAB — CBC
HCT: 36.7 % — ABNORMAL LOW (ref 39.0–52.0)
Hemoglobin: 11.8 g/dL — ABNORMAL LOW (ref 13.0–17.0)
MCHC: 32.1 g/dL (ref 30.0–36.0)
MCV: 82.3 fl (ref 78.0–100.0)
Platelets: 156 10*3/uL (ref 150.0–400.0)
RBC: 4.47 Mil/uL (ref 4.22–5.81)
RDW: 16.3 % — ABNORMAL HIGH (ref 11.5–15.5)
WBC: 4 10*3/uL (ref 4.0–10.5)

## 2019-04-28 LAB — TSH: TSH: 3.92 u[IU]/mL (ref 0.35–4.50)

## 2019-04-28 LAB — T4, FREE: Free T4: 0.84 ng/dL (ref 0.60–1.60)

## 2019-04-28 MED ORDER — CYANOCOBALAMIN 1000 MCG/ML IJ SOLN
1000.0000 ug | Freq: Once | INTRAMUSCULAR | Status: AC
  Administered 2019-04-28: 1000 ug via INTRAMUSCULAR

## 2019-04-28 NOTE — Progress Notes (Signed)
Phone [phone]   Subjective:  Timothy Gallagher is a 83 y.o. year old very pleasant male patient who presents for/with See problem oriented charting Chief Complaint  Patient presents with  . Dizziness   ROS- No fever, chills, cough, shortness of breath, body aches, sore throat, or loss of taste or smell. Does have dizziness with standing   Past Medical History-  Patient Active Problem List   Diagnosis Date Noted  . Hyperthyroidism 01/13/2018    Priority: High  . Detached retina, left 06/30/2017    Priority: High  . AAA (abdominal aortic aneurysm) ([place]) 05/12/2017    Priority: High  . CAD (coronary artery disease) of artery bypass graft 05/19/2014    Priority: High  . BPPV (benign paroxysmal positional vertigo) 09/01/2016    Priority: Medium  . Insomnia 11/22/2014    Priority: Medium  . Anemia 07/20/2014    Priority: Medium  . BPH (benign prostatic hyperplasia) 12/05/2013    Priority: Medium  . Hyperlipidemia 03/13/2008    Priority: Medium  . B12 deficiency 06/07/2007    Priority: Medium  . Essential hypertension 04/09/2007    Priority: Medium  . Left sided abdominal pain 04/15/2017    Priority: Low  . Low back pain 02/06/2017    Priority: Low  . Anal fissure 04/07/2016    Priority: Low  . Internal and external bleeding hemorrhoids 01/22/2015    Priority: Low  . Palpitations 02/11/2013    Priority: Low  . OSTEOARTHRITIS, WRIST, RIGHT 08/05/2010    Priority: Low  . ACNE ROSACEA 06/27/2009    Priority: Low  . ESOPHAGEAL STRICTURE 04/27/2009    Priority: Low  . ACTINIC KERATOSIS, HEAD 04/18/2009    Priority: Low  . NECK PAIN 09/14/2008    Priority: Low  . NEUROPATHY, IDIOPATHIC PERIPHERAL NEC 08/11/2007    Priority: Low  . Irritable bowel syndrome 08/11/2007    Priority: Low  . ALLERGIC RHINITIS 04/09/2007    Priority: Low  . Chest pain 03/06/2018    Medications- reviewed and updated Current Outpatient Medications  Medication Sig Dispense Refill  .  acetaminophen (TYLENOL) 500 MG tablet Take 500 mg by mouth daily as needed (for pain). Reported on 04/11/2016    . aspirin 81 MG EC tablet Take 1 tablet (81 mg total) by mouth daily. (Patient taking differently: Take 81 mg by mouth every evening. )    . atorvastatin (LIPITOR) 20 MG tablet TAKE 1 TABLET(20 MG) BY MOUTH DAILY 90 tablet 3  . fluticasone (FLONASE) 50 MCG/ACT nasal spray SHAKE LIQUID AND USE 1 TO 2 SPRAYS IN EACH NOSTRIL DAILY AS NEEDED FOR ALLERGIES 16 g 5  . methimazole (TAPAZOLE) 5 MG tablet Take 1 tablet (5 mg total) by mouth daily. 30 tablet 3  . mirabegron ER (MYRBETRIQ) 50 MG TB24 tablet Take 50 mg by mouth daily.    . Multiple Vitamins-Minerals (CENTRUM SILVER PO) Take 1 tablet by mouth daily.    . tamsulosin (FLOMAX) 0.4 MG CAPS capsule TK 1 C PO QD    . zolpidem (AMBIEN) 5 MG tablet Take 1 tablet (5 mg total) by mouth at bedtime as needed. for sleep 30 tablet 5   No current facility-administered medications for this visit.      Objective:  BP 122/72 (BP Location: Left Arm, Patient Position: Sitting, Cuff Size: Normal)   Pulse 78   Temp 98 F (36.7 C) (Oral)   Ht 6\' 1"  (1.854 m)   Wt 182 lb (82.6 kg)   SpO2 97%  BMI 24.01 kg/m  Gen: NAD, resting comfortably CV: RRR no murmurs rubs or gallops Lungs: CTAB no crackles, wheeze, rhonchi Abdomen: soft/nontender/nondistended/normal bowel sounds. No rebound or guarding.  Ext: no edema Skin: warm, dry Neuro: able to stand quickly today and appears balanced    Assessment and Plan   #atigue/Dizziness/BPH/B12 deficiency/hypertension S: Sx off and on x 1 year. Worse with bending down to pick something up.   Taking oral b12- states feels fatigued . Last b12 injection 3-4 months ago.  Feeling fatigued recentlyLow energy 3 months. Also feels like his dizziness has been worse in this time frame  BPH symptoms reasonably controlled on Flomax with no recent UTI A/P: Fatigue could certainly be caused by low B12 Lab Results   Component Value Date   VITAMINB12 140 (L) 04/28/2019  -We restarted his injections today and he will continue these monthly.  We obtained a B12 level before getting labs today -In regards to his dizziness I think this is related to orthostatic hypotension- advised patient to remain well-hydrated.  We also discussed Flomax likely contributes to orthostatic hypotension but he needs this due to severe UTI in the past. -Hypertension is controlled without medication still.  #hyperlipidemia S: Well controlled on atorvastatin 20 mg daily Lab Results  Component Value Date   CHOL 121 04/28/2019   HDL 43.70 04/28/2019   LDLCALC 59 04/28/2019   LDLDIRECT 91.0 05/23/2016   TRIG 90.0 04/28/2019   CHOLHDL 3 04/28/2019   A/P:  Stable. Continue current medications.  #Hypothyroidism-primarily managed by Dr. Cordelia Pen: On thyroid medication-methimazole 5 mg daily Lab Results  Component Value Date   TSH 3.92 04/28/2019  A/P: TSH and T4 within normal range today-he will keep follow-up with Dr. Loanne Drilling.  We checked this today to make sure it was not the cause of his fatigue or dizziness   Future Appointments  Date Time Provider [place]  05/26/2019 10:00 AM [facility] NURSE [facility]  07/29/2019 10:30 AM Renato Shin, MD [facility] None   Lab/Order associations:   ICD-10-CM   1. B12 deficiency  E53.8 Vitamin B12    cyanocobalamin ((VITAMIN B-12)) injection 1,000 mcg  2. Hyperlipidemia, unspecified hyperlipidemia type  E78.5 CBC    Comprehensive metabolic panel    Lipid panel  3. Essential hypertension  I10   4. Hyperthyroidism  E05.90 TSH    T4, free  5. Fatigue, unspecified type  R53.83   6. Orthostatic hypotension  I95.1    Return precautions advised.  [name], MD

## 2019-04-28 NOTE — Patient Instructions (Addendum)
b12 injection today. Lets get you back on monthly. Schedule nurse visit before you leave.   Make sure to stay well hydrated as that can cause dizziness. Your flomax can cause dizziness but we think you need that one.   Please stop by lab before you go If you do not have mychart- we will call you about results within 5 business days of [place] receiving them.  If you have mychart- we will send your results within 3 business days of [place] receiving them.  If abnormal or we want to clarify a result, we will call or mychart you to make sure you receive the message.  If you have questions or concerns or don't hear within 5-7 days, please send [place] a message or call us.

## 2019-05-26 ENCOUNTER — Ambulatory Visit: Payer: Medicare Other

## 2019-05-26 ENCOUNTER — Other Ambulatory Visit: Payer: Self-pay

## 2019-05-26 DIAGNOSIS — E538 Deficiency of other specified B group vitamins: Secondary | ICD-10-CM

## 2019-05-26 MED ORDER — CYANOCOBALAMIN 1000 MCG/ML IJ SOLN
1000.0000 ug | Freq: Once | INTRAMUSCULAR | Status: AC
  Administered 2019-05-26: 1000 ug via INTRAMUSCULAR

## 2019-05-26 NOTE — Progress Notes (Signed)
Per orders of [name], injection of B12 given in right deltoid by Lilli Light. Patient tolerated injection well. Next B12 injection scheduled for 1 month.

## 2019-05-31 ENCOUNTER — Other Ambulatory Visit: Payer: Self-pay | Admitting: Endocrinology

## 2019-06-21 ENCOUNTER — Ambulatory Visit: Payer: Medicare Other

## 2019-06-28 ENCOUNTER — Other Ambulatory Visit: Payer: Self-pay

## 2019-06-28 ENCOUNTER — Ambulatory Visit (INDEPENDENT_AMBULATORY_CARE_PROVIDER_SITE_OTHER): Payer: Medicare Other

## 2019-06-28 DIAGNOSIS — E538 Deficiency of other specified B group vitamins: Secondary | ICD-10-CM

## 2019-06-28 MED ORDER — CYANOCOBALAMIN 1000 MCG/ML IJ SOLN
1000.0000 ug | Freq: Once | INTRAMUSCULAR | Status: AC
  Administered 2019-06-28: 1000 ug via INTRAMUSCULAR

## 2019-06-28 NOTE — Progress Notes (Signed)
Per orders of [name], injection of Vitamin b12 given left deltoid IM by Clearnce Sorrel Zellmer, CMA Patient tolerated injection well, and will return in 1 month for his next injection.

## 2019-07-22 DIAGNOSIS — H35352 Cystoid macular degeneration, left eye: Secondary | ICD-10-CM | POA: Diagnosis not present

## 2019-07-22 DIAGNOSIS — H3322 Serous retinal detachment, left eye: Secondary | ICD-10-CM | POA: Diagnosis not present

## 2019-07-22 DIAGNOSIS — H353111 Nonexudative age-related macular degeneration, right eye, early dry stage: Secondary | ICD-10-CM | POA: Diagnosis not present

## 2019-07-27 ENCOUNTER — Other Ambulatory Visit: Payer: Self-pay

## 2019-07-28 ENCOUNTER — Ambulatory Visit (INDEPENDENT_AMBULATORY_CARE_PROVIDER_SITE_OTHER): Payer: Medicare Other

## 2019-07-28 DIAGNOSIS — E538 Deficiency of other specified B group vitamins: Secondary | ICD-10-CM

## 2019-07-28 DIAGNOSIS — Z23 Encounter for immunization: Secondary | ICD-10-CM

## 2019-07-28 MED ORDER — CYANOCOBALAMIN 1000 MCG/ML IJ SOLN
1000.0000 ug | Freq: Once | INTRAMUSCULAR | Status: AC
  Administered 2019-07-28: 1000 ug via INTRAMUSCULAR

## 2019-07-28 NOTE — Progress Notes (Signed)
Per orders of Dr.Hunter , injection of cyanocobalamin given in rt deltoid by Sandford Craze. RN  Patient tolerated injection well.

## 2019-07-28 NOTE — Progress Notes (Signed)
I have reviewed and agree with note, evaluation, plan.   Nan Maya, MD  

## 2019-07-29 ENCOUNTER — Other Ambulatory Visit: Payer: Self-pay

## 2019-07-29 ENCOUNTER — Ambulatory Visit (INDEPENDENT_AMBULATORY_CARE_PROVIDER_SITE_OTHER): Payer: Medicare Other | Admitting: Endocrinology

## 2019-07-29 ENCOUNTER — Encounter: Payer: Self-pay | Admitting: Endocrinology

## 2019-07-29 VITALS — BP 150/80 | HR 81 | Ht 73.0 in | Wt 184.4 lb

## 2019-07-29 DIAGNOSIS — E0590 Thyrotoxicosis, unspecified without thyrotoxic crisis or storm: Secondary | ICD-10-CM

## 2019-07-29 DIAGNOSIS — I10 Essential (primary) hypertension: Secondary | ICD-10-CM

## 2019-07-29 LAB — T4, FREE: Free T4: 0.79 ng/dL (ref 0.60–1.60)

## 2019-07-29 LAB — TSH: TSH: 3.77 u[IU]/mL (ref 0.35–4.50)

## 2019-07-29 NOTE — Patient Instructions (Addendum)
Your blood pressure is high today.  Please see your primary care provider soon, to have it rechecked Blood tests are requested for you today.  We'll let you know about the results.  If ever you have fever while taking methimazole, stop it and call us, even if the reason is obvious, because of the risk of a rare side-effect. Please come back for a follow-up appointment in 6 months

## 2019-07-29 NOTE — Progress Notes (Signed)
Subjective:    Patient ID: Timothy Gallagher, male    DOB: 12/30/1935, 83 y.o.   MRN: [id number]  HPI Pt returns for f/u of hyperthyroidism (dx'ed early 2019; TFT improved initially, but recurred in July, 2019, and was started on tapazole; he has never had thyroid imaging; he takes toprol for CAD, not thyroid; he declines RAI).  pt states he feels wel in general.   Past Medical History:  Diagnosis Date  . ACNE ROSACEA 06/27/2009  . ACTINIC KERATOSIS, HEAD 04/18/2009  . Acute maxillary sinusitis 05/14/2010  . ALLERGIC RHINITIS 04/09/2007  . Anal fissure 04/07/2016  . B12 DEFICIENCY 06/07/2007  . BACK PAIN WITH RADICULOPATHY 04/24/2008  . Cancer ([place])    skin  . CHEST WALL PAIN, ACUTE 06/15/2009  . Chronic maxillary sinusitis 05/29/2008  . COLITIS 04/27/2009  . COLONIC POLYPS, HX OF 04/27/2009   tubular adenomas  . Coronary artery disease 05/12/2014   Cath 05/12/2014 w/ severe 3-vessel CAD and preserved LV function, EF 55%  . DERMATITIS, ATOPIC 10/12/2007  . DIVERTICULOSIS, COLON 04/27/2009  . ECCHYMOSES, SPONTANEOUS 06/27/2008  . Elevated sedimentation rate 05/02/2009  . ESOPHAGEAL STRICTURE 04/27/2009  . GASTRITIS, CHRONIC 04/27/2009  . HYPERLIPIDEMIA 03/13/2008  . HYPERTENSION 04/09/2007  . Internal bleeding hemorrhoids 01/22/2015   01/22/2015 Seen at anoscopy, grade 1 all 3 positions   . Irritable bowel syndrome 08/11/2007  . KIDNEY DISEASE 04/09/2007  . NECK PAIN 09/14/2008  . NEUROPATHY, IDIOPATHIC PERIPHERAL NEC 08/11/2007  . OSTEOARTHRITIS, WRIST, RIGHT 08/05/2010  . Postoperative delirium 05/20/2014  . S/P CABG x 4 05/19/2014   LIMA to LAD, SVG to diag, SVG to OM, SVG to PDA, EVH via right thigh and leg    Past Surgical History:  Procedure Laterality Date  . CARDIAC CATHETERIZATION    . COLONOSCOPY    . CORONARY ARTERY BYPASS GRAFT N/A 05/19/2014   Procedure: CORONARY ARTERY BYPASS GRAFTING (CABG);  Surgeon: Rexene Alberts, MD;  Location: [facility];  Service: Open Heart Surgery;  Laterality: N/A;   Times 4 using left internal mammary artery and endoscopically harvested right saphenous vein  . ESOPHAGOGASTRODUODENOSCOPY    . FINGER SURGERY     cut off end of finger  . FLEXIBLE SIGMOIDOSCOPY    . HEMORRHOID BANDING    . HERNIA REPAIR    . INCISION AND DRAINAGE WOUND WITH FOREIGN BODY REMOVAL Left 12/20/2013   Procedure: INCISION AND DRAINAGE LEFT INDEX FINGER;  Surgeon: Tennis Must, MD;  Location: [facility];  Service: Orthopedics;  Laterality: Left;  . INTRAOPERATIVE TRANSESOPHAGEAL ECHOCARDIOGRAM N/A 05/19/2014   Procedure: INTRAOPERATIVE TRANSESOPHAGEAL ECHOCARDIOGRAM;  Surgeon: Rexene Alberts, MD;  Location: [facility];  Service: Open Heart Surgery;  Laterality: N/A;  . lamenectomy    . LEFT HEART CATHETERIZATION WITH CORONARY ANGIOGRAM N/A 05/12/2014   Procedure: LEFT HEART CATHETERIZATION WITH CORONARY ANGIOGRAM;  Surgeon: Sinclair Grooms, MD;  Location: [facility] CATH [facility];  Service: Cardiovascular;  Laterality: N/A;  . LUMBAR FUSION    . TONSILLECTOMY    . VARICOSE VEIN SURGERY Left     Social History   Socioeconomic History  . Marital status: Married    Spouse name: Not on file  . Number of children: 4  . Years of education: Not on file  . Highest education level: Not on file  Occupational History  . Occupation: retired    Fish farm manager: RETIRED    Comment: from [employer]  . Financial resource strain: Not on file  .  Food insecurity    Worry: Not on file    Inability: Not on file  . Transportation needs    Medical: Not on file    Non-medical: Not on file  Tobacco Use  . Smoking status: Former Smoker    Packs/day: 0.50    Years: 42.00    Pack years: 21.00    Types: Cigarettes    Quit date: 11/17/1998    Years since quitting: 20.7  . Smokeless tobacco: Never Used  Substance and Sexual Activity  . Alcohol use: No  . Drug use: No  . Sexual activity: Yes  Lifestyle  . Physical activity    Days per week: Not on file    Minutes per session: Not on file  . Stress:  Not on file  Relationships  . Social Herbalist on phone: Not on file    Gets together: Not on file    Attends religious service: Not on file    Active member of club or organization: Not on file    Attends meetings of clubs or organizations: Not on file    Relationship status: Not on file  . Intimate partner violence    Fear of current or ex partner: Not on file    Emotionally abused: Not on file    Physically abused: Not on file    Forced sexual activity: Not on file  Other Topics Concern  . Not on file  Social History Narrative   Married 35 years (2nd marriage). 2 kids from previous marriage. 2 stepchildren. 8 grandkids.       Retired from [employer]: walking/exercise, building in shop-furniture (cut finger off in February doing this)   6 grand children - Has bought them all a new car    Brother died of probably addiction   Sister also died; not sure of cause    He enjoys his life; Likes to play the keyboard; guitar    Had played in a gospel quartet     Current Outpatient Medications on File Prior to Visit  Medication Sig Dispense Refill  . acetaminophen (TYLENOL) 500 MG tablet Take 500 mg by mouth daily as needed (for pain). Reported on 04/11/2016    . aspirin 81 MG EC tablet Take 1 tablet (81 mg total) by mouth daily. (Patient taking differently: Take 81 mg by mouth every evening. )    . atorvastatin (LIPITOR) 20 MG tablet TAKE 1 TABLET(20 MG) BY MOUTH DAILY 90 tablet 3  . fluticasone (FLONASE) 50 MCG/ACT nasal spray SHAKE LIQUID AND USE 1 TO 2 SPRAYS IN EACH NOSTRIL DAILY AS NEEDED FOR ALLERGIES 16 g 5  . methimazole (TAPAZOLE) 5 MG tablet TAKE 1 TABLET(5 MG) BY MOUTH DAILY 30 tablet 3  . mirabegron ER (MYRBETRIQ) 50 MG TB24 tablet Take 50 mg by mouth daily.    . Multiple Vitamins-Minerals (CENTRUM SILVER PO) Take 1 tablet by mouth daily.    . tamsulosin (FLOMAX) 0.4 MG CAPS capsule TK 1 C PO QD    . zolpidem (AMBIEN) 5 MG tablet  Take 1 tablet (5 mg total) by mouth at bedtime as needed. for sleep 30 tablet 5   No current facility-administered medications on file prior to visit.     Allergies  Allergen Reactions  . Lipitor [Atorvastatin] Other (See Comments)    REACTION: nausea and blurred vision  . Trazodone And Nefazodone Other (See Comments)    dizzy  . Ciprofloxacin Swelling  .  Mycophenolate Mofetil Other (See Comments)    REACTION: unspecified  . Amoxicillin Rash    Has patient had a PCN reaction causing immediate rash, facial/tongue/throat swelling, SOB or lightheadedness with hypotension: Unknown Has patient had a PCN reaction causing severe rash involving mucus membranes or skin necrosis: Unknown Has patient had a PCN reaction that required hospitalization: Unknown Has patient had a PCN reaction occurring within the last 10 years: Unknown If all of the above answers are "NO", then may proceed with Cephalosporin use.   [name] Kitchen Penicillins Rash    Has patient had a PCN reaction causing immediate rash, facial/tongue/throat swelling, SOB or lightheadedness with hypotension: Unknown Has patient had a PCN reaction causing severe rash involving mucus membranes or skin necrosis: Unknown Has patient had a PCN reaction that required hospitalization: Unknown Has patient had a PCN reaction occurring within the last 10 years: Unknown If all of the above answers are "NO", then may proceed with Cephalosporin use.   . Rosuvastatin Other (See Comments)    Unknown     Family History  Problem Relation Age of Onset  . Hernia Mother   . Heart disease Father        smoker  . COPD Father   . Heart attack Brother   . Early death Daughter   . Colon cancer Neg Hx     BP (!) 150/80 (BP Location: Left Arm, Patient Position: Sitting, Cuff Size: Normal)   Pulse 81   Ht 6\' 1"  (1.854 m)   Wt 184 lb 6.4 oz (83.6 kg)   SpO2 98%   BMI 24.33 kg/m    Review of Systems Denies fever.      Objective:   Physical Exam VITAL  SIGNS:  See vs page.   GENERAL: no distress.  NECK: There is no palpable thyroid enlargement.  No thyroid nodule is palpable.  No palpable lymphadenopathy at the anterior neck.     Lab Results  Component Value Date   TSH 3.77 07/29/2019      Assessment & Plan:  Hyperthyroidism: well-controlled HTN: is noted today   Patient Instructions  Your blood pressure is high today.  Please see your primary care provider soon, to have it rechecked Blood tests are requested for you today.  We'll let you know about the results.  If ever you have fever while taking methimazole, stop it and call us, even if the reason is obvious, because of the risk of a rare side-effect. Please come back for a follow-up appointment in 6 months

## 2019-08-04 ENCOUNTER — Telehealth: Payer: Self-pay | Admitting: Family Medicine

## 2019-08-04 ENCOUNTER — Ambulatory Visit (INDEPENDENT_AMBULATORY_CARE_PROVIDER_SITE_OTHER): Payer: Medicare Other | Admitting: Family Medicine

## 2019-08-04 ENCOUNTER — Encounter: Payer: Self-pay | Admitting: Family Medicine

## 2019-08-04 VITALS — Ht 73.0 in | Wt 189.0 lb

## 2019-08-04 DIAGNOSIS — J3489 Other specified disorders of nose and nasal sinuses: Secondary | ICD-10-CM | POA: Diagnosis not present

## 2019-08-04 NOTE — Telephone Encounter (Signed)
Patient requesting call back from [name] or CMA to discuss nose bleeds starting yesterday. Patient has an appointment today, 08/04/2019, with Dr. Rogers Blocker. Patient states that every time he bends over his nose starts to bleed. Patient feels as if this is not related to allergies and would like to discuss further and get Dr. Ansel Bong advice. Please advise.

## 2019-08-04 NOTE — Progress Notes (Addendum)
Patient: Timothy Gallagher MRN: [id number] DOB: 12/08/1935 PCP: [name], MD     I connected with [name] on 08/04/19 at 10:00AM by a video enabled telemedicine application and verified that I am speaking with the correct person using two identifiers.  Location patient: Home Location provider: [facility], Office Persons participating in this virtual visit: Mak Mcquown and DR. Rogers Blocker  I discussed the limitations of evaluation and management by telemedicine and the availability of in person appointments. The patient expressed understanding and agreed to proceed.   Interactive audio and video telecommunications were attempted between this provider and patient, however failed, due to patient having technical difficulties OR patient did not have access to video capability.  We continued and completed visit with audio only.    Subjective:  Chief Complaint  Patient presents with  . sinus drainage    HPI: The patient is a 83 y.o. male who presents today for sinus drainage. It started yesterday with sinus drainage. He states, "my nose was pouring like a spigot." clear in color, no purulent or foul smelling drainage. NO trauma to face/head. He did not take any medication yesterday. He had some pressure on the right side of his forehead and right eye. He denies any other symptoms including no shortness of breath, cough, sore throat, ear pain, fever/chills. No loss of taste or smell. Has not been around any sick contacts or covid exposure. He went to bed last night and woke up today and he has had no issues. He states everything is back to normal. Lasted less than 12 hours.   Review of Systems  Constitutional: Negative for chills, fatigue and fever.  HENT: Positive for rhinorrhea. Negative for congestion, postnasal drip, sinus pressure, sinus pain and sore throat.   Eyes: Negative for photophobia and pain.  Respiratory: Negative for cough, chest tightness and shortness of breath.    Cardiovascular: Negative for chest pain, palpitations and leg swelling.  Gastrointestinal: Negative for abdominal pain, diarrhea, nausea and vomiting.  Musculoskeletal: Negative for back pain and neck pain.  Neurological: Negative for dizziness and headaches.  Psychiatric/Behavioral: Negative for sleep disturbance.    Allergies Patient is allergic to lipitor [atorvastatin]; trazodone and nefazodone; ciprofloxacin; mycophenolate mofetil; amoxicillin; penicillins; and rosuvastatin.  Past Medical History Patient  has a past medical history of ACNE ROSACEA (06/27/2009), ACTINIC KERATOSIS, HEAD (04/18/2009), Acute maxillary sinusitis (05/14/2010), ALLERGIC RHINITIS (04/09/2007), Anal fissure (04/07/2016), B12 DEFICIENCY (06/07/2007), BACK PAIN WITH RADICULOPATHY (04/24/2008), Cancer ([place]), CHEST WALL PAIN, ACUTE (06/15/2009), Chronic maxillary sinusitis (05/29/2008), COLITIS (04/27/2009), COLONIC POLYPS, HX OF (04/27/2009), Coronary artery disease (05/12/2014), DERMATITIS, ATOPIC (10/12/2007), DIVERTICULOSIS, COLON (04/27/2009), ECCHYMOSES, SPONTANEOUS (06/27/2008), Elevated sedimentation rate (05/02/2009), ESOPHAGEAL STRICTURE (04/27/2009), GASTRITIS, CHRONIC (04/27/2009), HYPERLIPIDEMIA (03/13/2008), HYPERTENSION (04/09/2007), Internal bleeding hemorrhoids (01/22/2015), Irritable bowel syndrome (08/11/2007), KIDNEY DISEASE (04/09/2007), NECK PAIN (09/14/2008), NEUROPATHY, IDIOPATHIC PERIPHERAL NEC (08/11/2007), OSTEOARTHRITIS, WRIST, RIGHT (08/05/2010), Postoperative delirium (05/20/2014), and S/P CABG x 4 (05/19/2014).  Surgical History Patient  has a past surgical history that includes Hernia repair; lamenectomy; Lumbar fusion; Tonsillectomy; Incision and drainage wound with foreign body removal (Left, 12/20/2013); Esophagogastroduodenoscopy; Colonoscopy; Flexible sigmoidoscopy; Varicose vein surgery (Left); Cardiac catheterization; Finger surgery; Coronary artery bypass graft (N/A, 05/19/2014); Intraoprative transesophageal  echocardiogram (N/A, 05/19/2014); left heart catheterization with coronary angiogram (N/A, 05/12/2014); and Hemorrhoid banding.  Family History Pateint's family history includes COPD in his father; Early death in his daughter; Heart attack in his brother; Heart disease in his father; Hernia in his mother.  Social History Patient  reports that  he quit smoking about 20 years ago. His smoking use included cigarettes. He has a 21.00 pack-year smoking history. He has never used smokeless tobacco. He reports that he does not drink alcohol or use drugs.    Objective: Vitals:   08/04/19 0930  Weight: 189 lb (85.7 kg)  Height: 6\' 1"  (1.854 m)    Body mass index is 24.94 kg/m.      Assessment/plan: 1. Rhinorrhea Less than 12 hours. symptom free. Discussed sounds like allergies. Recommended if returns to start flonase and a zyrtec or claritin. Symptoms change or worsen to f/u with pcp.   Return if symptoms worsen or fail to improve.  >15 minutes spent in face to face contact with patient discussing history, physical and plan of care.   Orma Flaming, MD [facility]  08/04/2019

## 2019-08-04 NOTE — Telephone Encounter (Signed)
Called and spoke with patient, he says that he starts to have blood tinged nasal drainage and sometimes just blood, from the right nostril when he bends over. He saw. Dr. Rogers Blocker and was told to try Flonase and either Claritin or Zytrec and she thought it was allergy related. Pt is not sure about it being allergy related and just wanted to let [name] know about it.   Forwarding as FYI.

## 2019-08-05 NOTE — Telephone Encounter (Signed)
Confusing situation here- notes state clear fluid from patient's nose-sounds like rhinorrhea.  Patient is reported this time seems to have changed to blood-tinged/blood from the nose.  For clear fluid/runny nose-allergies would be a primary concern.  Runny nose is also potential symptomatic over 19 and if you would like to be tested we can certainly do that.  A low-dose Claritin or Zyrtec at bedtime would be reasonable for allergies.  If he is having bloody nose- Flonase can actually increase risk of bloody nose so would not pursue that.  Aspirin increases his risk of bloody noses well but he needs that for his heart  Can use a same-day slot next week if patient would like to schedule follow-up with me.

## 2019-08-08 NOTE — Telephone Encounter (Signed)
Called pt and left VM to call the office.  

## 2019-08-09 NOTE — Telephone Encounter (Signed)
Patient called in stating that the nose bleeds have gone away. Patient is not in need of help at this time. Please advise.

## 2019-08-09 NOTE — Telephone Encounter (Signed)
Patient has been scheduled for 08/12/19 for his AWV.

## 2019-08-09 NOTE — Telephone Encounter (Signed)
Called pt and confirmed that nose bleeds have subsided.   He said that he was contacted about scheduling his medicare wellness visit.   Please call pt to schedule MWV with Loma Sousa, thanks!

## 2019-08-12 ENCOUNTER — Ambulatory Visit (INDEPENDENT_AMBULATORY_CARE_PROVIDER_SITE_OTHER): Payer: Medicare Other

## 2019-08-12 ENCOUNTER — Other Ambulatory Visit: Payer: Self-pay

## 2019-08-12 DIAGNOSIS — Z0000 Encounter for general adult medical examination without abnormal findings: Secondary | ICD-10-CM

## 2019-08-12 NOTE — Patient Instructions (Signed)
Timothy Gallagher , Thank you for taking time to come for your Medicare Wellness Visit. I appreciate your ongoing commitment to your health goals. Please review the following plan we discussed and let me know if I can assist you in the future.   Screening recommendations/referrals: Colorectal Screening: up to date; last 11/21/09  Vision and Dental Exams: Recommended annual ophthalmology exams for early detection of glaucoma and other disorders of the eye Recommended annual dental exams for proper oral hygiene  Vaccinations: Influenza vaccine: completed 07/28/19 Pneumococcal vaccine: up to date; last 02/05/15 Tdap vaccine: up to date; last 06/04/18  Shingles vaccine: Please call your insurance company to determine your out of pocket expense for the Shingrix vaccine. You may receive this vaccine at your local pharmacy.  Advanced directives: Advance directives discussed with you today. I have provided a copy for you to complete at home and have notarized. Once this is complete please bring a copy in to our office so we can scan it into your chart.  Goals: Recommend to drink at least 6-8 8oz glasses of water per day and continue to stay active.  Next appointment: Please schedule your Annual Wellness Visit with your Nurse Health Advisor in one year.  Preventive Care 81 Years and Older, Male Preventive care refers to lifestyle choices and visits with your health care provider that can promote health and wellness. What does preventive care include?  A yearly physical exam. This is also called an annual well check.  Dental exams once or twice a year.  Routine eye exams. Ask your health care provider how often you should have your eyes checked.  Personal lifestyle choices, including:  Daily care of your teeth and gums.  Regular physical activity.  Eating a healthy diet.  Avoiding tobacco and drug use.  Limiting alcohol use.  Practicing safe sex.  Taking low doses of aspirin every day if  recommended by your health care provider..  Taking vitamin and mineral supplements as recommended by your health care provider. What happens during an annual well check? The services and screenings done by your health care provider during your annual well check will depend on your age, overall health, lifestyle risk factors, and family history of disease. Counseling  Your health care provider may ask you questions about your:  Alcohol use.  Tobacco use.  Drug use.  Emotional well-being.  Home and relationship well-being.  Sexual activity.  Eating habits.  History of falls.  Memory and ability to understand (cognition).  Work and work Statistician. Screening  You may have the following tests or measurements:  Height, weight, and BMI.  Blood pressure.  Lipid and cholesterol levels. These may be checked every 5 years, or more frequently if you are over 81 years old.  Skin check.  Lung cancer screening. You may have this screening every year starting at age 10 if you have a 30-pack-year history of smoking and currently smoke or have quit within the past 15 years.  Fecal occult blood test (FOBT) of the stool. You may have this test every year starting at age 5.  Flexible sigmoidoscopy or colonoscopy. You may have a sigmoidoscopy every 5 years or a colonoscopy every 10 years starting at age 81.  Prostate cancer screening. Recommendations will vary depending on your family history and other risks.  Hepatitis C blood test.  Hepatitis B blood test.  Sexually transmitted disease (STD) testing.  Diabetes screening. This is done by checking your blood sugar (glucose) after you have not eaten  for a while (fasting). You may have this done every 1-3 years.  Abdominal aortic aneurysm (AAA) screening. You may need this if you are a current or former smoker.  Osteoporosis. You may be screened starting at age 35 if you are at high risk. Talk with your health care provider about  your test results, treatment options, and if necessary, the need for more tests. Vaccines  Your health care provider may recommend certain vaccines, such as:  Influenza vaccine. This is recommended every year.  Tetanus, diphtheria, and acellular pertussis (Tdap, Td) vaccine. You may need a Td booster every 10 years.  Zoster vaccine. You may need this after age 81.  Pneumococcal 13-valent conjugate (PCV13) vaccine. One dose is recommended after age 81.  Pneumococcal polysaccharide (PPSV23) vaccine. One dose is recommended after age 81. Talk to your health care provider about which screenings and vaccines you need and how often you need them. This information is not intended to replace advice given to you by your health care provider. Make sure you discuss any questions you have with your health care provider. Document Released: 11/30/2015 Document Revised: 07/23/2016 Document Reviewed: 09/04/2015 Elsevier Interactive Patient Education  2017 [employer] Prevention in the Home Falls can cause injuries. They can happen to people of all ages. There are many things you can do to make your home safe and to help prevent falls. What can I do on the outside of my home?  Regularly fix the edges of walkways and driveways and fix any cracks.  Remove anything that might make you trip as you walk through a door, such as a raised step or threshold.  Trim any bushes or trees on the path to your home.  Use bright outdoor lighting.  Clear any walking paths of anything that might make someone trip, such as rocks or tools.  Regularly check to see if handrails are loose or broken. Make sure that both sides of any steps have handrails.  Any raised decks and porches should have guardrails on the edges.  Have any leaves, snow, or ice cleared regularly.  Use sand or salt on walking paths during winter.  Clean up any spills in your garage right away. This includes oil or grease spills. What  can I do in the bathroom?  Use night lights.  Install grab bars by the toilet and in the tub and shower. Do not use towel bars as grab bars.  Use non-skid mats or decals in the tub or shower.  If you need to sit down in the shower, use a plastic, non-slip stool.  Keep the floor dry. Clean up any water that spills on the floor as soon as it happens.  Remove soap buildup in the tub or shower regularly.  Attach bath mats securely with double-sided non-slip rug tape.  Do not have throw rugs and other things on the floor that can make you trip. What can I do in the bedroom?  Use night lights.  Make sure that you have a light by your bed that is easy to reach.  Do not use any sheets or blankets that are too big for your bed. They should not hang down onto the floor.  Have a firm chair that has side arms. You can use this for support while you get dressed.  Do not have throw rugs and other things on the floor that can make you trip. What can I do in the kitchen?  Clean up any spills right  away.  Avoid walking on wet floors.  Keep items that you use a lot in easy-to-reach places.  If you need to reach something above you, use a strong step stool that has a grab bar.  Keep electrical cords out of the way.  Do not use floor polish or wax that makes floors slippery. If you must use wax, use non-skid floor wax.  Do not have throw rugs and other things on the floor that can make you trip. What can I do with my stairs?  Do not leave any items on the stairs.  Make sure that there are handrails on both sides of the stairs and use them. Fix handrails that are broken or loose. Make sure that handrails are as long as the stairways.  Check any carpeting to make sure that it is firmly attached to the stairs. Fix any carpet that is loose or worn.  Avoid having throw rugs at the top or bottom of the stairs. If you do have throw rugs, attach them to the floor with carpet tape.  Make sure  that you have a light switch at the top of the stairs and the bottom of the stairs. If you do not have them, ask someone to add them for you. What else can I do to help prevent falls?  Wear shoes that:  Do not have high heels.  Have rubber bottoms.  Are comfortable and fit you well.  Are closed at the toe. Do not wear sandals.  If you use a stepladder:  Make sure that it is fully opened. Do not climb a closed stepladder.  Make sure that both sides of the stepladder are locked into place.  Ask someone to hold it for you, if possible.  Clearly mark and make sure that you can see:  Any grab bars or handrails.  First and last steps.  Where the edge of each step is.  Use tools that help you move around (mobility aids) if they are needed. These include:  Canes.  Walkers.  Scooters.  Crutches.  Turn on the lights when you go into a dark area. Replace any light bulbs as soon as they burn out.  Set up your furniture so you have a clear path. Avoid moving your furniture around.  If any of your floors are uneven, fix them.  If there are any pets around you, be aware of where they are.  Review your medicines with your doctor. Some medicines can make you feel dizzy. This can increase your chance of falling. Ask your doctor what other things that you can do to help prevent falls. This information is not intended to replace advice given to you by your health care provider. Make sure you discuss any questions you have with your health care provider. Document Released: 08/30/2009 Document Revised: 04/10/2016 Document Reviewed: 12/08/2014 Elsevier Interactive Patient Education  2017 [employer].

## 2019-08-12 NOTE — Progress Notes (Signed)
This visit is being conducted via phone call due to the COVID-19 pandemic. This patient has given me verbal consent via phone to conduct this visit, patient states they are participating from their home address. Some vital signs may be absent or patient reported.   Patient identification: identified by name, DOB, and current address.   Subjective:   Timothy Gallagher is a 83 y.o. male who presents for Medicare Annual/Subsequent preventive examination.  Review of Systems:   Cardiac Risk Factors include: advanced age (>62men, >19 women);male gender;dyslipidemia     Objective:    Vitals: There were no vitals taken for this visit.  There is no height or weight on file to calculate BMI.  Advanced Directives 08/12/2019 06/14/2018 07/31/2017 07/31/2017 04/07/2017 05/20/2014 05/12/2014  Does Patient Have a Medical Advance Directive? No No No No No Patient does not have advance directive Patient does not have advance directive  Would patient like information on creating a medical advance directive? Yes (MAU/Ambulatory/Procedural Areas - Information given) No - Patient declined No - Patient declined - - - -  Pre-existing out of facility DNR order (yellow form or pink MOST form) - - - - - No -    Tobacco Social History   Tobacco Use  Smoking Status Former Smoker  . Packs/day: 0.50  . Years: 42.00  . Pack years: 21.00  . Types: Cigarettes  . Quit date: 11/17/1998  . Years since quitting: 20.7  Smokeless Tobacco Never Used     Counseling given: Not Answered   Clinical Intake:  Pre-visit preparation completed: Yes  Pain : No/denies pain  Diabetes: No  How often do you need to have someone help you when you read instructions, pamphlets, or other written materials from your doctor or pharmacy?: 1 - Never  Interpreter Needed?: No  Information entered by :: Denman George LPN  Past Medical History:  Diagnosis Date  . ACNE ROSACEA 06/27/2009  . ACTINIC KERATOSIS, HEAD 04/18/2009  . Acute  maxillary sinusitis 05/14/2010  . ALLERGIC RHINITIS 04/09/2007  . Anal fissure 04/07/2016  . B12 DEFICIENCY 06/07/2007  . BACK PAIN WITH RADICULOPATHY 04/24/2008  . Cancer ([place])    skin  . CHEST WALL PAIN, ACUTE 06/15/2009  . Chronic maxillary sinusitis 05/29/2008  . COLITIS 04/27/2009  . COLONIC POLYPS, HX OF 04/27/2009   tubular adenomas  . Coronary artery disease 05/12/2014   Cath 05/12/2014 w/ severe 3-vessel CAD and preserved LV function, EF 55%  . DERMATITIS, ATOPIC 10/12/2007  . DIVERTICULOSIS, COLON 04/27/2009  . ECCHYMOSES, SPONTANEOUS 06/27/2008  . Elevated sedimentation rate 05/02/2009  . ESOPHAGEAL STRICTURE 04/27/2009  . GASTRITIS, CHRONIC 04/27/2009  . HYPERLIPIDEMIA 03/13/2008  . HYPERTENSION 04/09/2007  . Internal bleeding hemorrhoids 01/22/2015   01/22/2015 Seen at anoscopy, grade 1 all 3 positions   . Irritable bowel syndrome 08/11/2007  . KIDNEY DISEASE 04/09/2007  . NECK PAIN 09/14/2008  . NEUROPATHY, IDIOPATHIC PERIPHERAL NEC 08/11/2007  . OSTEOARTHRITIS, WRIST, RIGHT 08/05/2010  . Postoperative delirium 05/20/2014  . S/P CABG x 4 05/19/2014   LIMA to LAD, SVG to diag, SVG to OM, SVG to PDA, EVH via right thigh and leg   Past Surgical History:  Procedure Laterality Date  . CARDIAC CATHETERIZATION    . COLONOSCOPY    . CORONARY ARTERY BYPASS GRAFT N/A 05/19/2014   Procedure: CORONARY ARTERY BYPASS GRAFTING (CABG);  Surgeon: Rexene Alberts, MD;  Location: [facility];  Service: Open Heart Surgery;  Laterality: N/A;  Times 4 using left internal mammary  artery and endoscopically harvested right saphenous vein  . ESOPHAGOGASTRODUODENOSCOPY    . FINGER SURGERY     cut off end of finger  . FLEXIBLE SIGMOIDOSCOPY    . HEMORRHOID BANDING    . HERNIA REPAIR    . INCISION AND DRAINAGE WOUND WITH FOREIGN BODY REMOVAL Left 12/20/2013   Procedure: INCISION AND DRAINAGE LEFT INDEX FINGER;  Surgeon: Tennis Must, MD;  Location: [facility];  Service: Orthopedics;  Laterality: Left;  . INTRAOPERATIVE  TRANSESOPHAGEAL ECHOCARDIOGRAM N/A 05/19/2014   Procedure: INTRAOPERATIVE TRANSESOPHAGEAL ECHOCARDIOGRAM;  Surgeon: Rexene Alberts, MD;  Location: [facility];  Service: Open Heart Surgery;  Laterality: N/A;  . lamenectomy    . LEFT HEART CATHETERIZATION WITH CORONARY ANGIOGRAM N/A 05/12/2014   Procedure: LEFT HEART CATHETERIZATION WITH CORONARY ANGIOGRAM;  Surgeon: Sinclair Grooms, MD;  Location: [facility];  Service: Cardiovascular;  Laterality: N/A;  . LUMBAR FUSION    . TONSILLECTOMY    . VARICOSE VEIN SURGERY Left    Family History  Problem Relation Age of Onset  . Hernia Mother   . Heart disease Father        smoker  . COPD Father   . Heart attack Brother   . Early death Daughter   . Colon cancer Neg Hx    Social History   Socioeconomic History  . Marital status: Married    Spouse name: Not on file  . Number of children: 4  . Years of education: Not on file  . Highest education level: Not on file  Occupational History  . Occupation: retired    Fish farm manager: RETIRED    Comment: from [employer]  . Financial resource strain: Not on file  . Food insecurity    Worry: Not on file    Inability: Not on file  . Transportation needs    Medical: Not on file    Non-medical: Not on file  Tobacco Use  . Smoking status: Former Smoker    Packs/day: 0.50    Years: 42.00    Pack years: 21.00    Types: Cigarettes    Quit date: 11/17/1998    Years since quitting: 20.7  . Smokeless tobacco: Never Used  Substance and Sexual Activity  . Alcohol use: No  . Drug use: No  . Sexual activity: Yes  Lifestyle  . Physical activity    Days per week: Not on file    Minutes per session: Not on file  . Stress: Not on file  Relationships  . Social Herbalist on phone: Not on file    Gets together: Not on file    Attends religious service: Not on file    Active member of club or organization: Not on file    Attends meetings of clubs or organizations: Not on file     Relationship status: Not on file  Other Topics Concern  . Not on file  Social History Narrative   Married 35 years (2nd marriage). 2 kids from previous marriage. 2 stepchildren. 8 grandkids.       Retired from [employer]: walking/exercise, building in shop-furniture (cut finger off in February doing this)   6 grand children - Has bought them all a new car    Brother died of probably addiction   Sister also died; not sure of cause    He enjoys his life; Likes to play the keyboard; guitar    Had  played in a gospel quartet    Enjoys working with wood     Outpatient Encounter Medications as of 08/12/2019  Medication Sig  . acetaminophen (TYLENOL) 500 MG tablet Take 500 mg by mouth daily as needed (for pain). Reported on 04/11/2016  . aspirin 81 MG EC tablet Take 1 tablet (81 mg total) by mouth daily. (Patient taking differently: Take 81 mg by mouth every evening. )  . atorvastatin (LIPITOR) 20 MG tablet TAKE 1 TABLET(20 MG) BY MOUTH DAILY  . fluticasone (FLONASE) 50 MCG/ACT nasal spray SHAKE LIQUID AND USE 1 TO 2 SPRAYS IN EACH NOSTRIL DAILY AS NEEDED FOR ALLERGIES  . ketorolac (ACULAR) 0.4 % SOLN 1 drop 4 (four) times daily.  . methimazole (TAPAZOLE) 5 MG tablet TAKE 1 TABLET(5 MG) BY MOUTH DAILY  . mirabegron ER (MYRBETRIQ) 50 MG TB24 tablet Take 50 mg by mouth daily.  . Multiple Vitamins-Minerals (CENTRUM SILVER PO) Take 1 tablet by mouth daily.  . Multiple Vitamins-Minerals (PRESERVISION AREDS 2 PO) Take by mouth.  . tamsulosin (FLOMAX) 0.4 MG CAPS capsule TK 1 C PO QD  . zolpidem (AMBIEN) 5 MG tablet Take 1 tablet (5 mg total) by mouth at bedtime as needed. for sleep   No facility-administered encounter medications on file as of 08/12/2019.     Activities of Daily Living In your present state of health, do you have any difficulty performing the following activities: 08/12/2019  Hearing? N  Vision? N  Difficulty concentrating or making decisions? N   Walking or climbing stairs? N  Dressing or bathing? N  Doing errands, shopping? N  Preparing Food and eating ? N  Using the Toilet? N  In the past six months, have you accidently leaked urine? N  Do you have problems with loss of bowel control? N  Managing your Medications? N  Managing your Finances? N  Some recent data might be hidden    Patient Care Team: [name], MD as PCP - General (Family Medicine) Bjorn Loser, MD as Consulting Physician (Urology) Alanda Slim, Neena Rhymes, MD as Consulting Physician (Ophthalmology) Renato Shin, MD as Consulting Physician (Endocrinology) Belva Crome, MD as Consulting Physician (Cardiology) Leonie Man, MD as Consulting Physician (Cardiology)   Assessment:   This is a routine wellness examination for Danzel.  Exercise Activities and Dietary recommendations Current Exercise Habits: The patient does not participate in regular exercise at present  Goals    . Exercise 150 min/wk Moderate Activity     Increase physical activity        Fall Risk Fall Risk  08/12/2019 06/14/2018 04/13/2018 04/07/2017 03/09/2017  Falls in the past year? 0 No No No No  Number falls in past yr: 0 - - - -  Injury with Fall? 0 - - - -  Follow up Education provided;Falls prevention discussed;Falls evaluation completed - - - -   Is the patient's home free of loose throw rugs in walkways, pet beds, electrical cords, etc?   yes      Grab bars in the bathroom? yes      Handrails on the stairs?   yes      Adequate lighting?   yes  Depression Screen PHQ 2/9 Scores 08/12/2019 04/28/2019 06/14/2018 04/13/2018  PHQ - 2 Score 0 0 0 0    Cognitive Function-no cognitive concerns at this time  Alert? Yes         Normal Appearance? N/a  Oriented to person? Yes  Place? Yes  Time? Yes  Recall of three objects? Yes  Can perform simple calculations? Yes  Displays appropriate judgment? Yes  Can read the correct time from a watch face? Yes   MMSE -  Mini Mental State Exam 04/07/2017  Not completed: (No Data)     6CIT Screen 06/14/2018  What Year? 0 points  What month? 0 points  What time? 0 points  Count back from 20 0 points  Months in reverse 0 points    Immunization History  Administered Date(s) Administered  . Fluad Quad(high Dose 65+) 07/28/2019  . Influenza Split 08/11/2011, 08/05/2012  . Influenza Whole 08/31/2007, 08/15/2008, 08/20/2009, 08/05/2010  . Influenza, High Dose Seasonal PF 07/28/2016, 09/10/2017, 08/13/2018  . Influenza,inj,Quad PF,6+ Mos 07/15/2013, 07/20/2014, 08/10/2015  . Pneumococcal Conjugate-13 11/04/2013  . Pneumococcal Polysaccharide-23 02/05/2015  . Td 03/18/2007  . Tdap 06/04/2018  . Zoster 04/16/2011    Qualifies for Shingles Vaccine? Discussed and patient will check with pharmacy for coverage.  Patient education handout provided    Screening Tests Health Maintenance  Topic Date Due  . TETANUS/TDAP  06/04/2028  . INFLUENZA VACCINE  Completed  . PNA vac Low Risk Adult  Completed   Cancer Screenings: Lung: Low Dose CT Chest recommended if Age 42-80 years, 30 pack-year currently smoking OR have quit w/in 15years. Patient does not qualify. Colorectal: not indicated; last colonoscopy 11/21/09     Plan:  I have personally reviewed and addressed the Medicare Annual Wellness questionnaire and have noted the following in the patient's chart:  A. Medical and social history B. Use of alcohol, tobacco or illicit drugs  C. Current medications and supplements D. Functional ability and status E.  Nutritional status F.  Physical activity G. Advance directives H. List of other physicians I.  Hospitalizations, surgeries, and ER visits in previous 12 months J.  [place] such as hearing and vision if needed, cognitive and depression L. Referrals, records requested, and appointments- none   In addition, I have reviewed and discussed with patient certain preventive protocols, quality  metrics, and best practice recommendations. A written personalized care plan for preventive services as well as general preventive health recommendations were provided to patient.   Signed,  Denman George, LPN  Nurse Health Advisor   Nurse Notes: No additional

## 2019-08-12 NOTE — Progress Notes (Signed)
I have reviewed and agree with note, evaluation, plan.   Stephen Hunter, MD  

## 2019-08-25 NOTE — Progress Notes (Signed)
Phone [phone]   Subjective:  Timothy Gallagher is a 83 y.o. year old very pleasant male patient who presents for/with See problem oriented charting Chief Complaint  Patient presents with  . Follow-up  . leg soreness  . b12 shot   ROS- No chest pain or shortness of breath. No headache. Working with eye doctor on some mild vision issues.  Occasional lightheadedness in AM.    Past Medical History-  Patient Active Problem List   Diagnosis Date Noted  . Hyperthyroidism 01/13/2018    Priority: High  . Detached retina, left 06/30/2017    Priority: High  . AAA (abdominal aortic aneurysm) ([place]) 05/12/2017    Priority: High  . CAD (coronary artery disease) of artery bypass graft 05/19/2014    Priority: High  . BPPV (benign paroxysmal positional vertigo) 09/01/2016    Priority: Medium  . Insomnia 11/22/2014    Priority: Medium  . Anemia 07/20/2014    Priority: Medium  . BPH (benign prostatic hyperplasia) 12/05/2013    Priority: Medium  . Hyperlipidemia 03/13/2008    Priority: Medium  . B12 deficiency 06/07/2007    Priority: Medium  . Essential hypertension 04/09/2007    Priority: Medium  . Chronic obstructive pulmonary disease ([place]) 08/26/2019    Priority: Low  . Paroxysmal atrial fibrillation ([place]) 08/26/2019    Priority: Low  . Left sided abdominal pain 04/15/2017    Priority: Low  . Low back pain 02/06/2017    Priority: Low  . Anal fissure 04/07/2016    Priority: Low  . Internal and external bleeding hemorrhoids 01/22/2015    Priority: Low  . Palpitations 02/11/2013    Priority: Low  . OSTEOARTHRITIS, WRIST, RIGHT 08/05/2010    Priority: Low  . ACNE ROSACEA 06/27/2009    Priority: Low  . ESOPHAGEAL STRICTURE 04/27/2009    Priority: Low  . ACTINIC KERATOSIS, HEAD 04/18/2009    Priority: Low  . NECK PAIN 09/14/2008    Priority: Low  . NEUROPATHY, IDIOPATHIC PERIPHERAL NEC 08/11/2007    Priority: Low  . Irritable bowel syndrome 08/11/2007    Priority: Low  .  ALLERGIC RHINITIS 04/09/2007    Priority: Low    Medications- reviewed and updated Current Outpatient Medications  Medication Sig Dispense Refill  . acetaminophen (TYLENOL) 500 MG tablet Take 500 mg by mouth daily as needed (for pain). Reported on 04/11/2016    . aspirin 81 MG EC tablet Take 1 tablet (81 mg total) by mouth daily. (Patient taking differently: Take 81 mg by mouth every evening. )    . atorvastatin (LIPITOR) 20 MG tablet TAKE 1 TABLET(20 MG) BY MOUTH DAILY 90 tablet 3  . fluticasone (FLONASE) 50 MCG/ACT nasal spray SHAKE LIQUID AND USE 1 TO 2 SPRAYS IN EACH NOSTRIL DAILY AS NEEDED FOR ALLERGIES 16 g 5  . ketorolac (ACULAR) 0.4 % SOLN 1 drop 4 (four) times daily.    . methimazole (TAPAZOLE) 5 MG tablet TAKE 1 TABLET(5 MG) BY MOUTH DAILY 30 tablet 3  . mirabegron ER (MYRBETRIQ) 50 MG TB24 tablet Take 50 mg by mouth daily.    . Multiple Vitamins-Minerals (CENTRUM SILVER PO) Take 1 tablet by mouth daily.    . Multiple Vitamins-Minerals (PRESERVISION AREDS 2 PO) Take by mouth.    . tamsulosin (FLOMAX) 0.4 MG CAPS capsule TK 1 C PO QD    . zolpidem (AMBIEN) 5 MG tablet Take 1 tablet (5 mg total) by mouth at bedtime as needed. for sleep 30 tablet 5  No current facility-administered medications for this visit.      Objective:  BP 136/84   Pulse 68   Temp 98.2 F (36.8 C)   Ht 6\' 1"  (1.854 m)   Wt 186 lb 9.6 oz (84.6 kg)   SpO2 98%   BMI 24.62 kg/m  Gen: NAD, resting comfortably CV: RRR no murmurs rubs or gallops Lungs: CTAB no crackles, wheeze, rhonchi Abdomen: soft/nontender/nondistended/normal bowel sounds.  Ext: no edema Skin: warm, dry Neuro: normal gait and speech Msk: mild tenderness over medial knee down onto upper shin    Assessment and Plan  Soreness in legs S:Pt complains of soreness and tenderness in his right calf/shin after dropping a 80lb bag of fertilizer on that leg a couple weeks ago. Walking regularly without difficulty A/P: mild issue and seems to  be improving- will monitor. Do not think he needs x-ray    B12 deficiency S: Patient had a period of time off injections and B12 level got low during COVID-19.  We have restarted injections. Lab Results  Component Value Date   VITAMINB12 140 (L) 04/28/2019   A/P: suspect improving- we opted to push bloodwork to next visit as just done in june  Chronic obstructive pulmonary disease, unspecified COPD type ([place]), Chronic S: COPD per x-ray July 03, 2014- patient denies wheezing or other issues.  Continue to monitor.  Former smoker but quit smoking in 2000 A/P: discussed issues- appears stable without serious symptoms- continue to monitor  Paroxysmal atrial fibrillation ([place]), Chronic S: History of postoperative atrial fibrillation I believe in 2015-no obvious recurrence.  We have not put him on anticoagulation as a result.. no palpitations A/P: no recurrence since 2015- remain off anticoagulant- monitor for recurrence- went over potential symptoms  Coronary artery disease  S: Patient remains chest pain and shortness of breath 3.  He is compliant with aspirin and statin. A/P: stable- continue current meds  #hypertension S: controlled on no medicine today. Does have orthostatic issues so cautious about adding meds BP Readings from Last 3 Encounters:  08/26/19 136/84  07/29/19 (!) 150/80  04/28/19 122/72  A/P: Stable. Continue without meds.   Recommended follow up: 6 months follow up or sooner if needed Future Appointments  Date Time Provider [place]  01/26/2020 10:30 AM Renato Shin, MD [facility] None   Lab/Order associations:   ICD-10-CM   1. Chronic obstructive pulmonary disease, unspecified COPD type ([place]) Chronic J44.9   2. Paroxysmal atrial fibrillation ([facility]) Chronic I48.0   3. Coronary artery disease involving native coronary artery of native heart with angina pectoris ([facility]) Chronic I25.119   4. B12 deficiency  E53.8 cyanocobalamin ((VITAMIN B-12)) injection 1,000  mcg  5. Essential hypertension  I10     Meds ordered this encounter  Medications  . cyanocobalamin ((VITAMIN B-12)) injection 1,000 mcg   Return precautions advised.  [name], MD

## 2019-08-25 NOTE — Patient Instructions (Addendum)
No changes today- will do bloodwork next visit with me  Make sure to schedule nurse visits monthly for your b12 to keep that up

## 2019-08-26 ENCOUNTER — Encounter: Payer: Self-pay | Admitting: Family Medicine

## 2019-08-26 ENCOUNTER — Ambulatory Visit (INDEPENDENT_AMBULATORY_CARE_PROVIDER_SITE_OTHER): Payer: Medicare Other | Admitting: Family Medicine

## 2019-08-26 ENCOUNTER — Other Ambulatory Visit: Payer: Self-pay

## 2019-08-26 ENCOUNTER — Ambulatory Visit: Payer: Medicare Other

## 2019-08-26 VITALS — BP 136/84 | HR 68 | Temp 98.2°F | Ht 73.0 in | Wt 186.6 lb

## 2019-08-26 DIAGNOSIS — I10 Essential (primary) hypertension: Secondary | ICD-10-CM

## 2019-08-26 DIAGNOSIS — J449 Chronic obstructive pulmonary disease, unspecified: Secondary | ICD-10-CM | POA: Diagnosis not present

## 2019-08-26 DIAGNOSIS — I25119 Atherosclerotic heart disease of native coronary artery with unspecified angina pectoris: Secondary | ICD-10-CM | POA: Diagnosis not present

## 2019-08-26 DIAGNOSIS — I480 Paroxysmal atrial fibrillation: Secondary | ICD-10-CM | POA: Diagnosis not present

## 2019-08-26 DIAGNOSIS — G63 Polyneuropathy in diseases classified elsewhere: Secondary | ICD-10-CM | POA: Insufficient documentation

## 2019-08-26 DIAGNOSIS — E538 Deficiency of other specified B group vitamins: Secondary | ICD-10-CM

## 2019-08-26 DIAGNOSIS — Z8679 Personal history of other diseases of the circulatory system: Secondary | ICD-10-CM | POA: Insufficient documentation

## 2019-08-26 MED ORDER — CYANOCOBALAMIN 1000 MCG/ML IJ SOLN
1000.0000 ug | Freq: Once | INTRAMUSCULAR | Status: AC
  Administered 2019-08-26: 1000 ug via INTRAMUSCULAR

## 2019-08-26 NOTE — Assessment & Plan Note (Signed)
S: COPD per x-ray July 03, 2014- patient denies wheezing or other issues.  Continue to monitor.  Former smoker but quit smoking in 2000 A/P: discussed issues- appears stable without serious symptoms- continue to monitor

## 2019-08-26 NOTE — Assessment & Plan Note (Signed)
S: History of postoperative atrial fibrillation I believe in 2015-no obvious recurrence.  We have not put him on anticoagulation as a result.. no palpitations A/P: no recurrence since 2015- remain off anticoagulant- monitor for recurrence- went over potential symptoms

## 2019-09-02 DIAGNOSIS — H35352 Cystoid macular degeneration, left eye: Secondary | ICD-10-CM | POA: Diagnosis not present

## 2019-09-05 ENCOUNTER — Other Ambulatory Visit: Payer: Self-pay

## 2019-09-05 ENCOUNTER — Telehealth: Payer: Self-pay | Admitting: Family Medicine

## 2019-09-05 NOTE — Telephone Encounter (Signed)
Yes this is the same leg and its aching and not swollen. Pt will try below recommendations and will follow up with [place] in a week.

## 2019-09-05 NOTE — Telephone Encounter (Signed)
Please confirm this is the area where he dropped the fertilizer on his leg.  Please also confirm he still has no swelling in the leg  I think it would be reasonable to try Tylenol 650 mg 3 times a day for 5 days as well as try heat 20 minutes 3 times a day-have him give [place] an update in a week if not improving and certainly sooner if worsening.

## 2019-09-05 NOTE — Telephone Encounter (Signed)
See below, did not see any f/u or next steps in your note from his ov other than "to monitor, don't think xray is needed".

## 2019-09-05 NOTE — Telephone Encounter (Signed)
Copied from [place] [phone]. Topic: General - Inquiry >> Sep 05, 2019 11:42 AM Richardo Priest, NT wrote: Reason for CRM: Patient called in stating his right leg has not gotten any better and would like advice on what to do next.

## 2019-09-14 ENCOUNTER — Other Ambulatory Visit: Payer: Self-pay | Admitting: Family Medicine

## 2019-09-19 ENCOUNTER — Other Ambulatory Visit: Payer: Self-pay

## 2019-09-19 DIAGNOSIS — E0590 Thyrotoxicosis, unspecified without thyrotoxic crisis or storm: Secondary | ICD-10-CM

## 2019-09-19 MED ORDER — METHIMAZOLE 5 MG PO TABS: 5.0000 mg | ORAL_TABLET | Freq: Every day | ORAL | 3 refills | Status: DC

## 2019-09-27 ENCOUNTER — Ambulatory Visit (INDEPENDENT_AMBULATORY_CARE_PROVIDER_SITE_OTHER): Payer: Medicare Other

## 2019-09-27 ENCOUNTER — Other Ambulatory Visit: Payer: Self-pay

## 2019-09-27 DIAGNOSIS — E538 Deficiency of other specified B group vitamins: Secondary | ICD-10-CM

## 2019-09-27 MED ORDER — CYANOCOBALAMIN 1000 MCG/ML IJ SOLN
1000.0000 ug | Freq: Once | INTRAMUSCULAR | Status: AC
  Administered 2019-09-27: 1000 ug via INTRAMUSCULAR

## 2019-10-03 NOTE — Progress Notes (Signed)
Patient received B12 injection per nursing staff.  I approved of this.  [name], MD

## 2019-10-26 ENCOUNTER — Other Ambulatory Visit: Payer: Self-pay

## 2019-10-27 ENCOUNTER — Ambulatory Visit (INDEPENDENT_AMBULATORY_CARE_PROVIDER_SITE_OTHER): Payer: Medicare Other

## 2019-10-27 DIAGNOSIS — E538 Deficiency of other specified B group vitamins: Secondary | ICD-10-CM | POA: Diagnosis not present

## 2019-10-27 MED ORDER — CYANOCOBALAMIN 1000 MCG/ML IJ SOLN
1000.0000 ug | Freq: Once | INTRAMUSCULAR | Status: AC
  Administered 2019-10-27: 1000 ug via INTRAMUSCULAR

## 2019-11-03 ENCOUNTER — Other Ambulatory Visit: Payer: Self-pay

## 2019-11-03 ENCOUNTER — Ambulatory Visit: Payer: Medicare Other | Admitting: Podiatry

## 2019-11-03 DIAGNOSIS — Q828 Other specified congenital malformations of skin: Secondary | ICD-10-CM

## 2019-11-04 NOTE — Progress Notes (Signed)
Per orders of Dr.Hunter , injection of b12  given in rt deltoid  by Sandford Craze. Patient tolerated injection well.

## 2019-11-06 NOTE — Progress Notes (Signed)
He presents today chief complaint of painful lesion to the plantar aspect of the left heel.  Objective: Vital signs are stable alert and oriented x3.  Pulses are palpable.  Solitary porokeratotic lesion plantar aspect of the left heel.  No erythema edema cellulitis drainage or odor.  Assessment: Pain in limb secondary to porokeratotic lesion.  Plan: Discussed etiology pathology conservative versus surgical therapy at this point the nucleated the lesion placed salicylic acid under occlusion to be left on for 3 days before washing off thoroughly.  He understands this is amendable to it we will follow-up with me in 6 weeks if necessary.

## 2019-11-09 NOTE — Progress Notes (Signed)
I have reviewed and agree with note, evaluation, plan.   Gunnison Chahal, MD  

## 2019-11-24 ENCOUNTER — Other Ambulatory Visit: Payer: Self-pay | Admitting: Family Medicine

## 2019-11-28 ENCOUNTER — Other Ambulatory Visit: Payer: Self-pay

## 2019-11-29 ENCOUNTER — Ambulatory Visit (INDEPENDENT_AMBULATORY_CARE_PROVIDER_SITE_OTHER): Payer: Medicare Other

## 2019-11-29 DIAGNOSIS — E538 Deficiency of other specified B group vitamins: Secondary | ICD-10-CM | POA: Diagnosis not present

## 2019-11-29 MED ORDER — CYANOCOBALAMIN 1000 MCG/ML IJ SOLN
1000.0000 ug | Freq: Once | INTRAMUSCULAR | Status: AC
  Administered 2019-11-29: 1000 ug via INTRAMUSCULAR

## 2019-11-29 NOTE — Progress Notes (Signed)
I have reviewed and agree with note, evaluation, plan.   Berle Fitz, MD  

## 2019-11-29 NOTE — Progress Notes (Signed)
Per orders of [name], injection of Vitamin b12, 1000  mcg given left deltoid IM by Kevan Ny, CMA Patient tolerated injection well.  Patient will return in 1 month for his next injection.

## 2019-12-12 DIAGNOSIS — H35352 Cystoid macular degeneration, left eye: Secondary | ICD-10-CM | POA: Diagnosis not present

## 2019-12-16 ENCOUNTER — Ambulatory Visit: Payer: Medicare Other

## 2019-12-22 ENCOUNTER — Ambulatory Visit: Payer: Medicare Other | Attending: Internal Medicine

## 2019-12-22 DIAGNOSIS — Z23 Encounter for immunization: Secondary | ICD-10-CM | POA: Insufficient documentation

## 2019-12-22 NOTE — Progress Notes (Incomplete)
   Covid-19 Vaccination Clinic  Name:  Timothy Gallagher    MRN: [id number] DOB: 1936-11-01  12/22/2019  Mr. Winterhalter was observed post Covid-19 immunization for {COVID Vaccine Observation Times:23551} without incidence. He was provided with Vaccine Information Sheet and instruction to access the V-Safe system.   Mr. Sinex was instructed to call 911 with any severe reactions post vaccine: [name] Kitchen Difficulty breathing  . Swelling of your face and throat  . A fast heartbeat  . A bad rash all over your body  . Dizziness and weakness    Immunizations Administered    Name Date Dose VIS Date Route   Pfizer COVID-19 Vaccine 12/22/2019  8:48 AM 0.3 mL 10/28/2019 Intramuscular   Manufacturer: [place]   Lot: [id number]   [place]: [id number]

## 2019-12-22 NOTE — Progress Notes (Signed)
   Covid-19 Vaccination Clinic  Name:  Timothy Gallagher    MRN: [id number] DOB: 1936-09-18  12/22/2019  [name] was observed post Covid-19 immunization for 15 minutes without incidence. He was provided with Vaccine Information Sheet and instruction to access the V-Safe system.   [name] was instructed to call 911 with any severe reactions post vaccine: [name] Kitchen Difficulty breathing  . Swelling of your face and throat  . A fast heartbeat  . A bad rash all over your body  . Dizziness and weakness    Immunizations Administered    Name Date Dose VIS Date Route   Pfizer COVID-19 Vaccine 12/22/2019  8:48 AM 0.3 mL 10/28/2019 Intramuscular   Manufacturer: [place]   Lot: [id number]   [place]: [id number]

## 2019-12-23 ENCOUNTER — Telehealth: Payer: Self-pay | Admitting: Family Medicine

## 2019-12-23 NOTE — Telephone Encounter (Signed)
Noted  

## 2019-12-23 NOTE — Telephone Encounter (Signed)
Pt called stating he has been experiencing dizziness. Pt states he received his second COVID vaccine shot yesterday and wasn't sure if that is why he has been dizzy. Transferred pt to triage line and will schedule appt to see [name].

## 2019-12-29 ENCOUNTER — Ambulatory Visit: Payer: Medicare Other

## 2019-12-30 ENCOUNTER — Ambulatory Visit (INDEPENDENT_AMBULATORY_CARE_PROVIDER_SITE_OTHER): Payer: Medicare Other | Admitting: Family Medicine

## 2019-12-30 ENCOUNTER — Other Ambulatory Visit: Payer: Self-pay

## 2019-12-30 ENCOUNTER — Encounter: Payer: Self-pay | Admitting: Family Medicine

## 2019-12-30 VITALS — BP 138/68 | HR 100 | Temp 98.0°F | Ht 73.0 in | Wt 195.6 lb

## 2019-12-30 DIAGNOSIS — N3001 Acute cystitis with hematuria: Secondary | ICD-10-CM | POA: Diagnosis not present

## 2019-12-30 DIAGNOSIS — R300 Dysuria: Secondary | ICD-10-CM

## 2019-12-30 LAB — POC URINALSYSI DIPSTICK (AUTOMATED)
Bilirubin, UA: POSITIVE
Blood, UA: POSITIVE
Glucose, UA: NEGATIVE
Ketones, UA: NEGATIVE
Nitrite, UA: POSITIVE
Protein, UA: POSITIVE — AB
Spec Grav, UA: >=1.03 — AB (ref 1.010–1.025)
Urobilinogen, UA: 0.2 E.U./dL
pH, UA: 5.5 (ref 5.0–8.0)

## 2019-12-30 MED ORDER — CEPHALEXIN 500 MG PO CAPS: 500.0000 mg | ORAL_CAPSULE | Freq: Four times a day (QID) | ORAL | 0 refills | Status: DC

## 2019-12-30 NOTE — Patient Instructions (Addendum)
I agree this is likely a urinary tract infection. Hopefully you have caught this early enough so the antibiotics can work   In 2018 when we saw you in office you were having some confusion- if that develops or you get chills or fever like you had before- you need to go to the hospital  Get 2 doses of antibiotics in today and then 4 doses tomorrow- if not improving by Monday let me know  Recommended follow up: as needed for new or worsening symptoms- otherwise would like to see you at least every 6 months

## 2019-12-30 NOTE — Progress Notes (Signed)
69 

## 2019-12-30 NOTE — Progress Notes (Signed)
Phone [phone] In person visit   Subjective:   Timothy Gallagher is a 84 y.o. year old very pleasant male patient who presents for/with See problem oriented charting Chief Complaint  Patient presents with  . Dysuria    x 2 days    This visit occurred during the SARS-CoV-2 public health emergency.  Safety protocols were in place, including screening questions prior to the visit, additional usage of staff PPE, and extensive cleaning of exam room while observing appropriate contact time as indicated for disinfecting solutions.   Past Medical History-  Patient Active Problem List   Diagnosis Date Noted  . Hyperthyroidism 01/13/2018    Priority: High  . Detached retina, left 06/30/2017    Priority: High  . AAA (abdominal aortic aneurysm) ([place]) 05/12/2017    Priority: High  . CAD (coronary artery disease) of artery bypass graft 05/19/2014    Priority: High  . BPPV (benign paroxysmal positional vertigo) 09/01/2016    Priority: Medium  . Insomnia 11/22/2014    Priority: Medium  . Anemia 07/20/2014    Priority: Medium  . BPH (benign prostatic hyperplasia) 12/05/2013    Priority: Medium  . Hyperlipidemia 03/13/2008    Priority: Medium  . B12 deficiency 06/07/2007    Priority: Medium  . Essential hypertension 04/09/2007    Priority: Medium  . Chronic obstructive pulmonary disease ([place]) 08/26/2019    Priority: Low  . Paroxysmal atrial fibrillation ([place]) 08/26/2019    Priority: Low  . Left sided abdominal pain 04/15/2017    Priority: Low  . Low back pain 02/06/2017    Priority: Low  . Anal fissure 04/07/2016    Priority: Low  . Internal and external bleeding hemorrhoids 01/22/2015    Priority: Low  . Palpitations 02/11/2013    Priority: Low  . OSTEOARTHRITIS, WRIST, RIGHT 08/05/2010    Priority: Low  . ACNE ROSACEA 06/27/2009    Priority: Low  . ESOPHAGEAL STRICTURE 04/27/2009    Priority: Low  . ACTINIC KERATOSIS, HEAD 04/18/2009    Priority: Low  . NECK PAIN  09/14/2008    Priority: Low  . NEUROPATHY, IDIOPATHIC PERIPHERAL NEC 08/11/2007    Priority: Low  . Irritable bowel syndrome 08/11/2007    Priority: Low  . ALLERGIC RHINITIS 04/09/2007    Priority: Low    Medications- reviewed and updated Current Outpatient Medications  Medication Sig Dispense Refill  . aspirin 81 MG EC tablet Take 1 tablet (81 mg total) by mouth daily. (Patient taking differently: Take 81 mg by mouth every evening. )    . atorvastatin (LIPITOR) 20 MG tablet TAKE 1 TABLET(20 MG) BY MOUTH DAILY 90 tablet 3  . fluticasone (FLONASE) 50 MCG/ACT nasal spray SHAKE LIQUID AND USE 1 TO 2 SPRAYS IN EACH NOSTRIL DAILY AS NEEDED FOR ALLERGIES 16 g 5  . methimazole (TAPAZOLE) 5 MG tablet Take 1 tablet (5 mg total) by mouth daily. 30 tablet 3  . mirabegron ER (MYRBETRIQ) 50 MG TB24 tablet Take 50 mg by mouth daily.    . Multiple Vitamins-Minerals (CENTRUM SILVER PO) Take 1 tablet by mouth daily.    . Multiple Vitamins-Minerals (PRESERVISION AREDS 2 PO) Take by mouth.    . tamsulosin (FLOMAX) 0.4 MG CAPS capsule TK 1 C PO QD    . zolpidem (AMBIEN) 5 MG tablet TAKE 1 TABLET(5 MG) BY MOUTH AT BEDTIME AS NEEDED FOR SLEEP 30 tablet 5  . acetaminophen (TYLENOL) 500 MG tablet Take 500 mg by mouth daily as needed (for pain). Reported  on 04/11/2016    . cephALEXin (KEFLEX) 500 MG capsule Take 1 capsule (500 mg total) by mouth 4 (four) times daily. 28 capsule 0   No current facility-administered medications for this visit.     Objective:  BP 138/68   Pulse 100   Temp 98 F (36.7 C) (Temporal)   Ht 6\' 1"  (1.854 m)   Wt 195 lb 9.6 oz (88.7 kg)   SpO2 100%   BMI 25.81 kg/m  Gen: NAD, resting comfortably CV: Regular not tachycardic on my exam no murmurs rubs or gallops Lungs: CTAB no crackles, wheeze, rhonchi Abdomen: soft/nontender/nondistended/normal bowel sounds. No rebound or guarding.  No CVA tenderness Ext: no edema Skin: warm, dry Patient declined rectal exam   Results for  orders placed or performed in visit on 12/30/19 (from the past 24 hour(s))  POCT Urinalysis Dipstick (Automated)     Status: Abnormal   Collection Time: 12/30/19  3:36 PM  Result Value Ref Range   Color, UA golden    Clarity, UA cloudy    Glucose, UA Negative Negative   Bilirubin, UA positive    Ketones, UA negative    Spec Grav, UA >=1.030 (A) 1.010 - 1.025   Blood, UA positive    pH, UA 5.5 5.0 - 8.0   Protein, UA Positive (A) Negative   Urobilinogen, UA 0.2 0.2 or 1.0 E.U./dL   Nitrite, UA positive    Leukocytes, UA Small (1+) (A) Negative      Assessment and Plan  #Concern for UTI  S:Patient states that he has had some discomfort with urination for two days- burns in lower portion of abdomen with urination. Denies any blood in urine. States that he as had increased frequency in the last few weeks. No fever, nausea or vomiting. He has had some abdominal pain on LRQ. Some fatigue but got some covid vaccine on Monday.  A/P: 84 year old male with BPH and history of pyelonephritis/sepsis in September 2018 presenting with more mild symptoms that he had at that time with some urinary frequency and mild dysuria.  UA concerning for UTI.  Previously when he had sepsis he had been off tamsulosin-currently listed as taking tamsulosin fortunately.  We will start Keflex 500 mg 4 times a day-looks like his renal function can tolerate this based off last labs.  Has done okay with cephalosporins in the past despite penicillin allergy.  We will get a urine culture.  He will follow-up if he has new or worsening symptoms and seek care over the weekend if has significant worsening of symptoms -Also appears mildly dehydrated-encouraged increased hydration based off high specific gravity -Declined rectal exam  Recommended follow up:  Future Appointments  Date Time Provider [place]  01/02/2020  9:00 AM [facility]ASC 1 [facility]  01/05/2020 10:00 AM [facility] NURSE [facility]  01/11/2020  4:20  PM Belva Crome, MD [facility]  02/24/2020 10:40 AM [name], MD [facility]  07/25/2020 10:45 AM Renato Shin, MD [facility] None    Lab/Order associations:   ICD-10-CM   1. Acute cystitis with hematuria  N30.01   2. Dysuria  R30.0 POCT Urinalysis Dipstick (Automated)    Urine Culture   Meds ordered this encounter  Medications  . cephALEXin (KEFLEX) 500 MG capsule    Sig: Take 1 capsule (500 mg total) by mouth 4 (four) times daily.    Dispense:  28 capsule    Refill:  0   Return precautions advised.  Annie Main  Yong Channel, MD

## 2019-12-31 ENCOUNTER — Observation Stay (HOSPITAL_COMMUNITY)
Admission: EM | Admit: 2019-12-31 | Discharge: 2020-01-01 | Disposition: A | Discharge status: Home / Self Care | Payer: Medicare Other | Attending: Internal Medicine | Admitting: Internal Medicine

## 2019-12-31 ENCOUNTER — Other Ambulatory Visit: Payer: Self-pay

## 2019-12-31 ENCOUNTER — Emergency Department (HOSPITAL_COMMUNITY): Payer: Medicare Other

## 2019-12-31 ENCOUNTER — Encounter (HOSPITAL_COMMUNITY): Payer: Self-pay | Admitting: Emergency Medicine

## 2019-12-31 DIAGNOSIS — Z981 Arthrodesis status: Secondary | ICD-10-CM | POA: Insufficient documentation

## 2019-12-31 DIAGNOSIS — Z7982 Long term (current) use of aspirin: Secondary | ICD-10-CM | POA: Insufficient documentation

## 2019-12-31 DIAGNOSIS — R4182 Altered mental status, unspecified: Secondary | ICD-10-CM

## 2019-12-31 DIAGNOSIS — G629 Polyneuropathy, unspecified: Secondary | ICD-10-CM | POA: Diagnosis not present

## 2019-12-31 DIAGNOSIS — I700 Atherosclerosis of aorta: Secondary | ICD-10-CM | POA: Insufficient documentation

## 2019-12-31 DIAGNOSIS — Z79899 Other long term (current) drug therapy: Secondary | ICD-10-CM | POA: Insufficient documentation

## 2019-12-31 DIAGNOSIS — I480 Paroxysmal atrial fibrillation: Secondary | ICD-10-CM | POA: Diagnosis not present

## 2019-12-31 DIAGNOSIS — Z885 Allergy status to narcotic agent status: Secondary | ICD-10-CM | POA: Diagnosis not present

## 2019-12-31 DIAGNOSIS — F0390 Unspecified dementia without behavioral disturbance: Secondary | ICD-10-CM | POA: Insufficient documentation

## 2019-12-31 DIAGNOSIS — N400 Enlarged prostate without lower urinary tract symptoms: Secondary | ICD-10-CM | POA: Diagnosis not present

## 2019-12-31 DIAGNOSIS — R443 Hallucinations, unspecified: Secondary | ICD-10-CM | POA: Insufficient documentation

## 2019-12-31 DIAGNOSIS — Z86010 Personal history of colonic polyps: Secondary | ICD-10-CM | POA: Diagnosis not present

## 2019-12-31 DIAGNOSIS — N390 Urinary tract infection, site not specified: Secondary | ICD-10-CM | POA: Diagnosis not present

## 2019-12-31 DIAGNOSIS — Z87891 Personal history of nicotine dependence: Secondary | ICD-10-CM | POA: Diagnosis not present

## 2019-12-31 DIAGNOSIS — R410 Disorientation, unspecified: Secondary | ICD-10-CM

## 2019-12-31 DIAGNOSIS — Z881 Allergy status to other antibiotic agents status: Secondary | ICD-10-CM | POA: Diagnosis not present

## 2019-12-31 DIAGNOSIS — J449 Chronic obstructive pulmonary disease, unspecified: Secondary | ICD-10-CM | POA: Insufficient documentation

## 2019-12-31 DIAGNOSIS — E785 Hyperlipidemia, unspecified: Secondary | ICD-10-CM | POA: Insufficient documentation

## 2019-12-31 DIAGNOSIS — I2510 Atherosclerotic heart disease of native coronary artery without angina pectoris: Secondary | ICD-10-CM | POA: Diagnosis not present

## 2019-12-31 DIAGNOSIS — N3000 Acute cystitis without hematuria: Secondary | ICD-10-CM

## 2019-12-31 DIAGNOSIS — E0590 Thyrotoxicosis, unspecified without thyrotoxic crisis or storm: Secondary | ICD-10-CM | POA: Diagnosis not present

## 2019-12-31 DIAGNOSIS — Z03818 Encounter for observation for suspected exposure to other biological agents ruled out: Secondary | ICD-10-CM | POA: Diagnosis not present

## 2019-12-31 DIAGNOSIS — R42 Dizziness and giddiness: Secondary | ICD-10-CM | POA: Diagnosis not present

## 2019-12-31 DIAGNOSIS — Z951 Presence of aortocoronary bypass graft: Secondary | ICD-10-CM | POA: Diagnosis not present

## 2019-12-31 DIAGNOSIS — Z87440 Personal history of urinary (tract) infections: Secondary | ICD-10-CM | POA: Insufficient documentation

## 2019-12-31 DIAGNOSIS — I4510 Unspecified right bundle-branch block: Secondary | ICD-10-CM | POA: Diagnosis not present

## 2019-12-31 DIAGNOSIS — R110 Nausea: Secondary | ICD-10-CM | POA: Diagnosis not present

## 2019-12-31 DIAGNOSIS — I10 Essential (primary) hypertension: Secondary | ICD-10-CM | POA: Diagnosis not present

## 2019-12-31 DIAGNOSIS — R000 Tachycardia, unspecified: Secondary | ICD-10-CM | POA: Diagnosis not present

## 2019-12-31 DIAGNOSIS — K589 Irritable bowel syndrome without diarrhea: Secondary | ICD-10-CM | POA: Insufficient documentation

## 2019-12-31 DIAGNOSIS — M19031 Primary osteoarthritis, right wrist: Secondary | ICD-10-CM | POA: Insufficient documentation

## 2019-12-31 DIAGNOSIS — Z880 Allergy status to penicillin: Secondary | ICD-10-CM | POA: Diagnosis not present

## 2019-12-31 DIAGNOSIS — Z20822 Contact with and (suspected) exposure to covid-19: Secondary | ICD-10-CM | POA: Diagnosis not present

## 2019-12-31 DIAGNOSIS — Z888 Allergy status to other drugs, medicaments and biological substances status: Secondary | ICD-10-CM | POA: Insufficient documentation

## 2019-12-31 DIAGNOSIS — Z825 Family history of asthma and other chronic lower respiratory diseases: Secondary | ICD-10-CM | POA: Insufficient documentation

## 2019-12-31 DIAGNOSIS — Z8249 Family history of ischemic heart disease and other diseases of the circulatory system: Secondary | ICD-10-CM | POA: Insufficient documentation

## 2019-12-31 LAB — I-STAT CHEM 8, ED
BUN: 16 mg/dL (ref 8–23)
Calcium, Ion: 1.15 mmol/L (ref 1.15–1.40)
Chloride: 102 mmol/L (ref 98–111)
Creatinine, Ser: 1 mg/dL (ref 0.61–1.24)
Glucose, Bld: 126 mg/dL — ABNORMAL HIGH (ref 70–99)
HCT: 43 % (ref 39.0–52.0)
Hemoglobin: 14.6 g/dL (ref 13.0–17.0)
Potassium: 4.2 mmol/L (ref 3.5–5.1)
Sodium: 138 mmol/L (ref 135–145)
TCO2: 25 mmol/L (ref 22–32)

## 2019-12-31 LAB — CBC WITH DIFFERENTIAL/PLATELET
Abs Immature Granulocytes: 0.05 10*3/uL (ref 0.00–0.07)
Basophils Absolute: 0 10*3/uL (ref 0.0–0.1)
Basophils Relative: 0 %
Eosinophils Absolute: 0 10*3/uL (ref 0.0–0.5)
Eosinophils Relative: 0 %
HCT: 43.4 % (ref 39.0–52.0)
Hemoglobin: 13.7 g/dL (ref 13.0–17.0)
Immature Granulocytes: 1 %
Lymphocytes Relative: 7 %
Lymphs Abs: 0.7 10*3/uL (ref 0.7–4.0)
MCH: 26.6 pg (ref 26.0–34.0)
MCHC: 31.6 g/dL (ref 30.0–36.0)
MCV: 84.1 fL (ref 80.0–100.0)
Monocytes Absolute: 0.4 10*3/uL (ref 0.1–1.0)
Monocytes Relative: 4 %
Neutro Abs: 8.2 10*3/uL — ABNORMAL HIGH (ref 1.7–7.7)
Neutrophils Relative %: 88 %
Platelets: 155 10*3/uL (ref 150–400)
RBC: 5.16 MIL/uL (ref 4.22–5.81)
RDW: 15.5 % (ref 11.5–15.5)
WBC: 9.3 10*3/uL (ref 4.0–10.5)
nRBC: 0 % (ref 0.0–0.2)

## 2019-12-31 LAB — URINALYSIS, ROUTINE W REFLEX MICROSCOPIC
Bacteria, UA: NONE SEEN
Bilirubin Urine: NEGATIVE
Glucose, UA: NEGATIVE mg/dL
Ketones, ur: 5 mg/dL — AB
Nitrite: NEGATIVE
Protein, ur: >=300 mg/dL — AB
Specific Gravity, Urine: 1.018 (ref 1.005–1.030)
pH: 6 (ref 5.0–8.0)

## 2019-12-31 LAB — RESPIRATORY PANEL BY RT PCR (FLU A&B, COVID)
Influenza A by PCR: NEGATIVE
Influenza B by PCR: NEGATIVE
SARS Coronavirus 2 by RT PCR: NEGATIVE

## 2019-12-31 LAB — TSH: TSH: 1.719 u[IU]/mL (ref 0.350–4.500)

## 2019-12-31 MED ORDER — SODIUM CHLORIDE 0.9 % IV SOLN
1.0000 g | INTRAVENOUS | Status: DC
  Administered 2020-01-01: 09:00:00 1 g via INTRAVENOUS
  Filled 2019-12-31: qty 1

## 2019-12-31 MED ORDER — POLYETHYLENE GLYCOL 3350 17 G PO PACK: 17.0000 g | PACK | Freq: Every day | ORAL | Status: DC | PRN

## 2019-12-31 MED ORDER — TAMSULOSIN HCL 0.4 MG PO CAPS
0.4000 mg | ORAL_CAPSULE | Freq: Every day | ORAL | Status: DC
  Administered 2019-12-31 – 2020-01-01 (×2): 0.4 mg via ORAL
  Filled 2019-12-31 (×2): qty 1

## 2019-12-31 MED ORDER — ATORVASTATIN CALCIUM 10 MG PO TABS
20.0000 mg | ORAL_TABLET | Freq: Every day | ORAL | Status: DC
  Administered 2019-12-31 – 2020-01-01 (×2): 20 mg via ORAL
  Filled 2019-12-31: qty 1
  Filled 2019-12-31: qty 2

## 2019-12-31 MED ORDER — FLUTICASONE PROPIONATE 50 MCG/ACT NA SUSP
2.0000 | Freq: Every day | NASAL | Status: DC | PRN
  Filled 2019-12-31: qty 16

## 2019-12-31 MED ORDER — SODIUM CHLORIDE 0.9 % IV SOLN
1.0000 g | Freq: Once | INTRAVENOUS | Status: AC
  Administered 2019-12-31: 03:00:00 1 g via INTRAVENOUS
  Filled 2019-12-31: qty 10

## 2019-12-31 MED ORDER — METHIMAZOLE 5 MG PO TABS
5.0000 mg | ORAL_TABLET | Freq: Every day | ORAL | Status: DC
  Administered 2019-12-31 – 2020-01-01 (×2): 5 mg via ORAL
  Filled 2019-12-31 (×2): qty 1

## 2019-12-31 MED ORDER — SODIUM CHLORIDE 0.9 % IV SOLN: INTRAVENOUS | Status: AC

## 2019-12-31 MED ORDER — ACETAMINOPHEN 650 MG RE SUPP: 650.0000 mg | Freq: Four times a day (QID) | RECTAL | Status: DC | PRN

## 2019-12-31 MED ORDER — ASPIRIN 81 MG PO TBEC
81.0000 mg | DELAYED_RELEASE_TABLET | Freq: Every day | ORAL | Status: DC
  Administered 2019-12-31: 81 mg via ORAL
  Filled 2019-12-31 (×2): qty 1

## 2019-12-31 MED ORDER — SODIUM CHLORIDE 0.9 % IV BOLUS
1000.0000 mL | Freq: Once | INTRAVENOUS | Status: AC
  Administered 2019-12-31: 1000 mL via INTRAVENOUS

## 2019-12-31 MED ORDER — ACETAMINOPHEN 325 MG PO TABS: 650.0000 mg | ORAL_TABLET | Freq: Four times a day (QID) | ORAL | Status: DC | PRN

## 2019-12-31 NOTE — ED Provider Notes (Signed)
[facility] DEPT Provider Note   CSN: [id number] Arrival date & time: 12/31/19  0156     History Chief Complaint  Patient presents with  . Urinary Tract Infection    confusion    Timothy Gallagher is a 84 y.o. male.  The history is provided by the patient and the EMS personnel. The history is limited by the condition of the patient.  Urinary Tract Infection Presenting symptoms: dysuria   Context: spontaneously   Relieved by:  Nothing Worsened by:  Nothing Ineffective treatments:  Prescription drugs (keflex) Associated symptoms: no abdominal pain, no fever, no groin pain and no vomiting   Risk factors: no kidney stones   Altered Mental Status Presenting symptoms: confusion   Presenting symptoms comment:  Hallucinations Severity:  Moderate Most recent episode:  Today Episode history:  Single Timing:  Constant Progression:  Improving Chronicity:  Recurrent Context: recent infection   Associated symptoms: no abdominal pain, no difficulty breathing, no fever, no slurred speech, no suicidal behavior, no visual change, no vomiting and no weakness        Past Medical History:  Diagnosis Date  . ACNE ROSACEA 06/27/2009  . ACTINIC KERATOSIS, HEAD 04/18/2009  . Acute maxillary sinusitis 05/14/2010  . ALLERGIC RHINITIS 04/09/2007  . Anal fissure 04/07/2016  . B12 DEFICIENCY 06/07/2007  . BACK PAIN WITH RADICULOPATHY 04/24/2008  . Cancer ([place])    skin  . CHEST WALL PAIN, ACUTE 06/15/2009  . Chronic maxillary sinusitis 05/29/2008  . COLITIS 04/27/2009  . COLONIC POLYPS, HX OF 04/27/2009   tubular adenomas  . Coronary artery disease 05/12/2014   Cath 05/12/2014 w/ severe 3-vessel CAD and preserved LV function, EF 55%  . DERMATITIS, ATOPIC 10/12/2007  . DIVERTICULOSIS, COLON 04/27/2009  . ECCHYMOSES, SPONTANEOUS 06/27/2008  . Elevated sedimentation rate 05/02/2009  . ESOPHAGEAL STRICTURE 04/27/2009  . GASTRITIS, CHRONIC 04/27/2009  . HYPERLIPIDEMIA 03/13/2008    . HYPERTENSION 04/09/2007  . Internal bleeding hemorrhoids 01/22/2015   01/22/2015 Seen at anoscopy, grade 1 all 3 positions   . Irritable bowel syndrome 08/11/2007  . KIDNEY DISEASE 04/09/2007  . NECK PAIN 09/14/2008  . NEUROPATHY, IDIOPATHIC PERIPHERAL NEC 08/11/2007  . OSTEOARTHRITIS, WRIST, RIGHT 08/05/2010  . Postoperative delirium 05/20/2014  . S/P CABG x 4 05/19/2014   LIMA to LAD, SVG to diag, SVG to OM, SVG to PDA, EVH via right thigh and leg    Patient Active Problem List   Diagnosis Date Noted  . Chronic obstructive pulmonary disease ([place]) 08/26/2019  . Paroxysmal atrial fibrillation ([place]) 08/26/2019  . Hyperthyroidism 01/13/2018  . Detached retina, left 06/30/2017  . AAA (abdominal aortic aneurysm) ([place]) 05/12/2017  . Left sided abdominal pain 04/15/2017  . Low back pain 02/06/2017  . BPPV (benign paroxysmal positional vertigo) 09/01/2016  . Anal fissure 04/07/2016  . Internal and external bleeding hemorrhoids 01/22/2015  . Insomnia 11/22/2014  . Anemia 07/20/2014  . CAD (coronary artery disease) of artery bypass graft 05/19/2014  . BPH (benign prostatic hyperplasia) 12/05/2013  . Palpitations 02/11/2013  . OSTEOARTHRITIS, WRIST, RIGHT 08/05/2010  . ACNE ROSACEA 06/27/2009  . ESOPHAGEAL STRICTURE 04/27/2009  . ACTINIC KERATOSIS, HEAD 04/18/2009  . NECK PAIN 09/14/2008  . Hyperlipidemia 03/13/2008  . NEUROPATHY, IDIOPATHIC PERIPHERAL NEC 08/11/2007  . Irritable bowel syndrome 08/11/2007  . B12 deficiency 06/07/2007  . Essential hypertension 04/09/2007  . ALLERGIC RHINITIS 04/09/2007    Past Surgical History:  Procedure Laterality Date  . CARDIAC CATHETERIZATION    . COLONOSCOPY    .  CORONARY ARTERY BYPASS GRAFT N/A 05/19/2014   Procedure: CORONARY ARTERY BYPASS GRAFTING (CABG);  Surgeon: Rexene Alberts, MD;  Location: [facility];  Service: Open Heart Surgery;  Laterality: N/A;  Times 4 using left internal mammary artery and endoscopically harvested right saphenous vein  .  ESOPHAGOGASTRODUODENOSCOPY    . FINGER SURGERY     cut off end of finger  . FLEXIBLE SIGMOIDOSCOPY    . HEMORRHOID BANDING    . HERNIA REPAIR    . INCISION AND DRAINAGE WOUND WITH FOREIGN BODY REMOVAL Left 12/20/2013   Procedure: INCISION AND DRAINAGE LEFT INDEX FINGER;  Surgeon: Tennis Must, MD;  Location: [facility];  Service: Orthopedics;  Laterality: Left;  . INTRAOPERATIVE TRANSESOPHAGEAL ECHOCARDIOGRAM N/A 05/19/2014   Procedure: INTRAOPERATIVE TRANSESOPHAGEAL ECHOCARDIOGRAM;  Surgeon: Rexene Alberts, MD;  Location: [facility];  Service: Open Heart Surgery;  Laterality: N/A;  . lamenectomy    . LEFT HEART CATHETERIZATION WITH CORONARY ANGIOGRAM N/A 05/12/2014   Procedure: LEFT HEART CATHETERIZATION WITH CORONARY ANGIOGRAM;  Surgeon: Sinclair Grooms, MD;  Location: [facility] C[facility];  Service: Cardiovascular;  Laterality: N/A;  . LUMBAR FUSION    . TONSILLECTOMY    . VARICOSE VEIN SURGERY Left        Family History  Problem Relation Age of Onset  . Hernia Mother   . Heart disease Father        smoker  . COPD Father   . Heart attack Brother   . Early death Daughter   . Colon cancer Neg Hx     Social History   Tobacco Use  . Smoking status: Former Smoker    Packs/day: 0.50    Years: 42.00    Pack years: 21.00    Types: Cigarettes    Quit date: 11/17/1998    Years since quitting: 21.1  . Smokeless tobacco: Never Used  Substance Use Topics  . Alcohol use: No  . Drug use: No    Home Medications Prior to Admission medications   Medication Sig Start Date End Date Taking? Authorizing Provider  acetaminophen (TYLENOL) 500 MG tablet Take 500 mg by mouth daily as needed (for pain). Reported on 04/11/2016   Yes [provider]  aspirin 81 MG EC tablet Take 1 tablet (81 mg total) by mouth daily. Patient taking differently: Take 81 mg by mouth every evening.  06/19/15  Yes Belva Crome, MD  atorvastatin (LIPITOR) 20 MG tablet TAKE 1 TABLET(20 MG) BY MOUTH DAILY Patient taking  differently: Take 20 mg by mouth daily.  11/24/19  Yes [name], MD  cephALEXin (KEFLEX) 500 MG capsule Take 1 capsule (500 mg total) by mouth 4 (four) times daily. 12/30/19  Yes [name], MD  fluticasone (FLONASE) 50 MCG/ACT nasal spray SHAKE LIQUID AND USE 1 TO 2 SPRAYS IN EACH NOSTRIL DAILY AS NEEDED FOR ALLERGIES Patient taking differently: Place 2 sprays into both nostrils daily as needed for allergies.  09/13/18  Yes [name], MD  methimazole (TAPAZOLE) 5 MG tablet Take 1 tablet (5 mg total) by mouth daily. 09/19/19  Yes Renato Shin, MD  mirabegron ER (MYRBETRIQ) 50 MG TB24 tablet Take 50 mg by mouth daily.   Yes [provider]  Multiple Vitamins-Minerals (CENTRUM SILVER PO) Take 1 tablet by mouth daily.   Yes [provider]  Multiple Vitamins-Minerals (PRESERVISION AREDS 2 PO) Take by mouth.   Yes [provider]  tamsulosin (FLOMAX) 0.4 MG CAPS capsule Take 0.4 mg  by mouth daily.  12/23/18  Yes [provider]  zolpidem (AMBIEN) 5 MG tablet TAKE 1 TABLET(5 MG) BY MOUTH AT BEDTIME AS NEEDED FOR SLEEP Patient taking differently: Take 5 mg by mouth at bedtime as needed for sleep.  09/19/19  Yes [name], MD    Allergies    Lipitor [atorvastatin], Trazodone and nefazodone, Ciprofloxacin, Mycophenolate mofetil, Amoxicillin, Penicillins, and Rosuvastatin  Review of Systems   Review of Systems  Unable to perform ROS: Mental status change  Constitutional: Negative for fever.  Respiratory: Negative for cough.   Gastrointestinal: Negative for abdominal pain and vomiting.  Genitourinary: Positive for dysuria.  Neurological: Negative for weakness.  Psychiatric/Behavioral: Positive for confusion.    Physical Exam Updated Vital Signs BP (!) 146/78   Pulse (!) 102   Resp (!) 21   SpO2 98%   Physical Exam Vitals and nursing note reviewed.  Constitutional:      General: He is not in acute distress.    Appearance: Normal  appearance.  HENT:     Head: Normocephalic and atraumatic.     Nose: Nose normal.  Eyes:     Conjunctiva/sclera: Conjunctivae normal.     Pupils: Pupils are equal, round, and reactive to light.  Cardiovascular:     Rate and Rhythm: Normal rate and regular rhythm.     Pulses: Normal pulses.     Heart sounds: Normal heart sounds.  Pulmonary:     Effort: Pulmonary effort is normal.     Breath sounds: Normal breath sounds.  Abdominal:     General: Abdomen is flat. Bowel sounds are normal.     Tenderness: There is no abdominal tenderness. There is no guarding or rebound.  Musculoskeletal:        General: Normal range of motion.     Cervical back: Normal range of motion and neck supple.  Skin:    General: Skin is warm and dry.     Capillary Refill: Capillary refill takes less than 2 seconds.  Neurological:     Mental Status: He is alert.     Deep Tendon Reflexes: Reflexes normal.     Comments: Confused   Psychiatric:        Mood and Affect: Mood normal.        Behavior: Behavior normal.     ED Results / Procedures / Treatments   Labs (all labs ordered are listed, but only abnormal results are displayed) Results for orders placed or performed during the hospital encounter of 12/31/19  CBC with Differential/Platelet  Result Value Ref Range   WBC 9.3 4.0 - 10.5 K/uL   RBC 5.16 4.22 - 5.81 MIL/uL   Hemoglobin 13.7 13.0 - 17.0 g/dL   HCT 43.4 39.0 - 52.0 %   MCV 84.1 80.0 - 100.0 fL   MCH 26.6 26.0 - 34.0 pg   MCHC 31.6 30.0 - 36.0 g/dL   RDW 15.5 11.5 - 15.5 %   Platelets 155 150 - 400 K/uL   nRBC 0.0 0.0 - 0.2 %   Neutrophils Relative % 88 %   Neutro Abs 8.2 (H) 1.7 - 7.7 K/uL   Lymphocytes Relative 7 %   Lymphs Abs 0.7 0.7 - 4.0 K/uL   Monocytes Relative 4 %   Monocytes Absolute 0.4 0.1 - 1.0 K/uL   Eosinophils Relative 0 %   Eosinophils Absolute 0.0 0.0 - 0.5 K/uL   Basophils Relative 0 %   Basophils Absolute 0.0 0.0 - 0.1 K/uL  Immature Granulocytes 1 %   Abs  Immature Granulocytes 0.05 0.00 - 0.07 K/uL  Urinalysis, Routine w reflex microscopic  Result Value Ref Range   Color, Urine YELLOW YELLOW   APPearance CLEAR CLEAR   Specific Gravity, Urine 1.018 1.005 - 1.030   pH 6.0 5.0 - 8.0   Glucose, UA NEGATIVE NEGATIVE mg/dL   Hgb urine dipstick SMALL (A) NEGATIVE   Bilirubin Urine NEGATIVE NEGATIVE   Ketones, ur 5 (A) NEGATIVE mg/dL   Protein, ur >=300 (A) NEGATIVE mg/dL   Nitrite NEGATIVE NEGATIVE   Leukocytes,Ua SMALL (A) NEGATIVE   RBC / HPF 6-10 0 - 5 RBC/hpf   WBC, UA 21-50 0 - 5 WBC/hpf   Bacteria, UA NONE SEEN NONE SEEN   Squamous Epithelial / LPF 0-5 0 - 5   Mucus PRESENT   I-stat chem 8, ED (not at [facility] or [facility])  Result Value Ref Range   Sodium 138 135 - 145 mmol/L   Potassium 4.2 3.5 - 5.1 mmol/L   Chloride 102 98 - 111 mmol/L   BUN 16 8 - 23 mg/dL   Creatinine, Ser 1.00 0.61 - 1.24 mg/dL   Glucose, Bld 126 (H) 70 - 99 mg/dL   Calcium, Ion 1.15 1.15 - 1.40 mmol/L   TCO2 25 22 - 32 mmol/L   Hemoglobin 14.6 13.0 - 17.0 g/dL   HCT 43.0 39.0 - 52.0 %   No results found.  Radiology No results found.  Procedures Procedures (including critical care time)  Medications Ordered in ED Medications  cefTRIAXone (ROCEPHIN) 1 g in sodium chloride 0.9 % 100 mL IVPB (0 g Intravenous Stopped 12/31/19 0344)    ED Course  I have reviewed the triage vital signs and the nursing notes.  Pertinent labs & imaging results that were available during my care of the patient were reviewed by me and considered in my medical decision making (see chart for details).    UTI, failure of outpatient management.  NACPD on CXr.  THIS IS LIKE PREVIOUS UTI.  Final Clinical Impression(s) / ED Diagnoses Final diagnoses:  Lower urinary tract infectious disease  Altered mental status, unspecified altered mental status type  admit to medicine    Marino Rogerson, MD 12/31/19 [id number]

## 2019-12-31 NOTE — H&P (Addendum)
History and Physical        Hospital Admission Note Date: 12/31/2019  Patient name: Timothy Gallagher Medical record number: [id number] Date of birth: 1936/01/12 Age: 84 y.o. Gender: male  PCP: [name], MD  Patient coming from: Home Lives with: Wife At baseline, ambulates: Independently  Chief Complaint    Dysuria Confusion   HPI:   This is an 84 year old male with past medical history of BPH, pyelonephritis/sepsis in September 2018, CAD status post CABG, hyperlipidemia who was recently seen by his PCP on 2/12 for dysuria x2 days and urinary frequency found to have positive UA and started on treatment with Keflex 500 mg 4 times daily however brought in to [facility] ED early a.m. of 2/13 for confusion and hallucinations with nausea but no vomiting.  Had associated chills, sweats.  Currently, patient denies any complaints of dysuria, confusion, hallucinations at this time.  Wife over the phone states patient did not have visual or auditory hallucinations but was very confused and saying "strange things ."  She said this happened last time he had a urinary tract infection.  He did take Ambien last night.  Notable ED labs: No leukocytosis, no anemia, no electrolyte abnormalities, glucose 126.  UA from 2/13 with small leukocytes, > 300 protein.  UA from 2/12: Cloudy, positive nitrites, small positive RBC leukocytes, ED treatments: Ceftriaxone ED imaging: CXR unremarkable, CT brain without acute changes ED vitals:   Vitals:   12/31/19 0732 12/31/19 0746  BP: 130/79   Pulse: 91   Resp: 19 (!) 25  Temp:    SpO2: 99%     Review of Systems:  Review of Systems  Constitutional: Positive for chills and diaphoresis.  HENT: Negative.   Eyes: Negative.  Negative for blurred vision.       Chronic left eye droop and poor vision  Respiratory: Negative for cough and shortness of breath.    Cardiovascular: Negative for chest pain and palpitations.  Gastrointestinal: Negative for abdominal pain, nausea and vomiting.  Genitourinary: Positive for dysuria and frequency. Negative for flank pain.  Musculoskeletal: Negative for back pain and myalgias.  Skin: Negative for rash.  Neurological: Negative for dizziness and headaches.  Psychiatric/Behavioral:       No auditory or visual hallucinations. Chronic memory loss not acute  All other systems reviewed and are negative.   Medical/Social/Family History   Past Medical History: Past Medical History:  Diagnosis Date  . ACNE ROSACEA 06/27/2009  . ACTINIC KERATOSIS, HEAD 04/18/2009  . Acute maxillary sinusitis 05/14/2010  . ALLERGIC RHINITIS 04/09/2007  . Anal fissure 04/07/2016  . B12 DEFICIENCY 06/07/2007  . BACK PAIN WITH RADICULOPATHY 04/24/2008  . Cancer ([place])    skin  . CHEST WALL PAIN, ACUTE 06/15/2009  . Chronic maxillary sinusitis 05/29/2008  . COLITIS 04/27/2009  . COLONIC POLYPS, HX OF 04/27/2009   tubular adenomas  . Coronary artery disease 05/12/2014   Cath 05/12/2014 w/ severe 3-vessel CAD and preserved LV function, EF 55%  . DERMATITIS, ATOPIC 10/12/2007  . DIVERTICULOSIS, COLON 04/27/2009  . ECCHYMOSES, SPONTANEOUS 06/27/2008  . Elevated sedimentation rate 05/02/2009  . ESOPHAGEAL STRICTURE 04/27/2009  . GASTRITIS, CHRONIC 04/27/2009  . HYPERLIPIDEMIA 03/13/2008  . HYPERTENSION 04/09/2007  .  Internal bleeding hemorrhoids 01/22/2015   01/22/2015 Seen at anoscopy, grade 1 all 3 positions   . Irritable bowel syndrome 08/11/2007  . KIDNEY DISEASE 04/09/2007  . NECK PAIN 09/14/2008  . NEUROPATHY, IDIOPATHIC PERIPHERAL NEC 08/11/2007  . OSTEOARTHRITIS, WRIST, RIGHT 08/05/2010  . Postoperative delirium 05/20/2014  . S/P CABG x 4 05/19/2014   LIMA to LAD, SVG to diag, SVG to OM, SVG to PDA, EVH via right thigh and leg    Past Surgical History:  Procedure Laterality Date  . CARDIAC CATHETERIZATION    . COLONOSCOPY    . CORONARY  ARTERY BYPASS GRAFT N/A 05/19/2014   Procedure: CORONARY ARTERY BYPASS GRAFTING (CABG);  Surgeon: Rexene Alberts, MD;  Location: [place];  Service: Open Heart Surgery;  Laterality: N/A;  Times 4 using left internal mammary artery and endoscopically harvested right saphenous vein  . ESOPHAGOGASTRODUODENOSCOPY    . FINGER SURGERY     cut off end of finger  . FLEXIBLE SIGMOIDOSCOPY    . HEMORRHOID BANDING    . HERNIA REPAIR    . INCISION AND DRAINAGE WOUND WITH FOREIGN BODY REMOVAL Left 12/20/2013   Procedure: INCISION AND DRAINAGE LEFT INDEX FINGER;  Surgeon: Tennis Must, MD;  Location: [facility];  Service: Orthopedics;  Laterality: Left;  . INTRAOPERATIVE TRANSESOPHAGEAL ECHOCARDIOGRAM N/A 05/19/2014   Procedure: INTRAOPERATIVE TRANSESOPHAGEAL ECHOCARDIOGRAM;  Surgeon: Rexene Alberts, MD;  Location: [place];  Service: Open Heart Surgery;  Laterality: N/A;  . lamenectomy    . LEFT HEART CATHETERIZATION WITH CORONARY ANGIOGRAM N/A 05/12/2014   Procedure: LEFT HEART CATHETERIZATION WITH CORONARY ANGIOGRAM;  Surgeon: Sinclair Grooms, MD;  Location: [facility];  Service: Cardiovascular;  Laterality: N/A;  . LUMBAR FUSION    . TONSILLECTOMY    . VARICOSE VEIN SURGERY Left     Medications: Prior to Admission medications   Medication Sig Start Date End Date Taking? Authorizing Provider  acetaminophen (TYLENOL) 500 MG tablet Take 500 mg by mouth daily as needed (for pain). Reported on 04/11/2016   Yes [provider]  aspirin 81 MG EC tablet Take 1 tablet (81 mg total) by mouth daily. Patient taking differently: Take 81 mg by mouth every evening.  06/19/15  Yes Belva Crome, MD  atorvastatin (LIPITOR) 20 MG tablet TAKE 1 TABLET(20 MG) BY MOUTH DAILY Patient taking differently: Take 20 mg by mouth daily.  11/24/19  Yes [name], MD  cephALEXin (KEFLEX) 500 MG capsule Take 1 capsule (500 mg total) by mouth 4 (four) times daily. 12/30/19  Yes [name], MD  fluticasone (FLONASE) 50  MCG/ACT nasal spray SHAKE LIQUID AND USE 1 TO 2 SPRAYS IN EACH NOSTRIL DAILY AS NEEDED FOR ALLERGIES Patient taking differently: Place 2 sprays into both nostrils daily as needed for allergies.  09/13/18  Yes [name], MD  methimazole (TAPAZOLE) 5 MG tablet Take 1 tablet (5 mg total) by mouth daily. 09/19/19  Yes Renato Shin, MD  mirabegron ER (MYRBETRIQ) 50 MG TB24 tablet Take 50 mg by mouth daily.   Yes [provider]  Multiple Vitamins-Minerals (CENTRUM SILVER PO) Take 1 tablet by mouth daily.   Yes [provider]  Multiple Vitamins-Minerals (PRESERVISION AREDS 2 PO) Take by mouth.   Yes [provider]  tamsulosin (FLOMAX) 0.4 MG CAPS capsule Take 0.4 mg by mouth daily.  12/23/18  Yes [provider]  zolpidem (AMBIEN) 5 MG tablet TAKE 1 TABLET(5 MG) BY MOUTH AT BEDTIME AS NEEDED  FOR SLEEP Patient taking differently: Take 5 mg by mouth at bedtime as needed for sleep.  09/19/19  Yes [name], MD    Allergies:   Allergies  Allergen Reactions  . Lipitor [Atorvastatin] Other (See Comments)    REACTION: nausea and blurred vision  . Trazodone And Nefazodone Other (See Comments)    dizzy  . Ciprofloxacin Swelling  . Mycophenolate Mofetil Other (See Comments)    REACTION: unspecified  . Amoxicillin Rash    Has patient had a PCN reaction causing immediate rash, facial/tongue/throat swelling, SOB or lightheadedness with hypotension: Unknown Has patient had a PCN reaction causing severe rash involving mucus membranes or skin necrosis: Unknown Has patient had a PCN reaction that required hospitalization: Unknown Has patient had a PCN reaction occurring within the last 10 years: Unknown If all of the above answers are "NO", then may proceed with Cephalosporin use.   [name] Kitchen Penicillins Rash    Has patient had a PCN reaction causing immediate rash, facial/tongue/throat swelling, SOB or lightheadedness with hypotension: Unknown Has patient had a PCN  reaction causing severe rash involving mucus membranes or skin necrosis: Unknown Has patient had a PCN reaction that required hospitalization: Unknown Has patient had a PCN reaction occurring within the last 10 years: Unknown If all of the above answers are "NO", then may proceed with Cephalosporin use.   . Rosuvastatin Other (See Comments)    Unknown     Social History:  reports that he quit smoking about 21 years ago. His smoking use included cigarettes. He has a 21.00 pack-year smoking history. He has never used smokeless tobacco. He reports that he does not drink alcohol or use drugs.  Family History: Family History  Problem Relation Age of Onset  . Hernia Mother   . Heart disease Father        smoker  . COPD Father   . Heart attack Brother   . Early death Daughter   . Colon cancer Neg Hx      Objective   Physical Exam: Blood pressure 130/79, pulse 91, temperature 98 F (36.7 C), temperature source Oral, resp. rate (!) 25, SpO2 99 %.  Physical Exam Vitals and nursing note reviewed.  Constitutional:      Appearance: Normal appearance.  HENT:     Head: Normocephalic and atraumatic.  Eyes:     Conjunctiva/sclera: Conjunctivae normal.     Comments: Left eye droop  Cardiovascular:     Rate and Rhythm: Normal rate and regular rhythm.  Pulmonary:     Effort: Pulmonary effort is normal.     Breath sounds: Normal breath sounds.  Abdominal:     General: Abdomen is flat.     Palpations: Abdomen is soft.  Musculoskeletal:        General: No swelling or tenderness.  Skin:    Coloration: Skin is not jaundiced or pale.  Neurological:     Mental Status: He is alert and oriented to person, place, and time. Mental status is at baseline.  Psychiatric:        Mood and Affect: Mood normal.        Behavior: Behavior normal.     LABS on Admission: I have personally reviewed all the labs and imagings below    Basic Metabolic Panel: Recent Labs  Lab 12/31/19 0326  NA 138    K 4.2  CL 102  GLUCOSE 126*  BUN 16  CREATININE 1.00   Liver Function Tests: No results for input(s): AST,  ALT, ALKPHOS, BILITOT, PROT, ALBUMIN in the last 168 hours. No results for input(s): LIPASE, AMYLASE in the last 168 hours. No results for input(s): AMMONIA in the last 168 hours. CBC: Recent Labs  Lab 12/31/19 0311 12/31/19 0326  WBC 9.3  --   NEUTROABS 8.2*  --   HGB 13.7 14.6  HCT 43.4 43.0  MCV 84.1  --   PLT 155  --    Cardiac Enzymes: No results for input(s): CKTOTAL, CKMB, CKMBINDEX, TROPONINI in the last 168 hours. BNP: Invalid input(s): POCBNP CBG: No results for input(s): GLUCAP in the last 168 hours.  Radiological Exams on Admission:  CT Head Wo Contrast  Result Date: 12/31/2019 CLINICAL DATA:  Confusion. Recent urinary tract infection. EXAM: CT HEAD WITHOUT CONTRAST TECHNIQUE: Contiguous axial images were obtained from the base of the skull through the vertex without intravenous contrast. COMPARISON:  09/18/08 FINDINGS: Brain: No evidence of acute infarction, hemorrhage, hydrocephalus, extra-axial collection or mass lesion/mass effect. There is mild diffuse low-attenuation within the subcortical and periventricular white matter compatible with chronic microvascular disease. Prominence of the ventricles and CSF spaces identified compatible with brain atrophy. Vascular: No hyperdense vessel or unexpected calcification. Skull: Normal. Negative for fracture or focal lesion. Sinuses/Orbits: No acute finding. Other: None. IMPRESSION: 1. No acute intracranial abnormalities. 2. Chronic small vessel ischemic disease and brain atrophy. Electronically Signed   By: Kerby Moors M.D.   On: 12/31/2019 05:32   DG Chest Portable 1 View  Result Date: 12/31/2019 CLINICAL DATA:  Confusion and hallucinations. EXAM: PORTABLE CHEST 1 VIEW COMPARISON:  01/10/2018 FINDINGS: Previous median sternotomy and CABG. Heart size is normal. Chronic aortic atherosclerosis. The lungs are clear.  The vascularity is normal. No effusions. IMPRESSION: No active disease.  Previous CABG.  Aortic atherosclerosis. Electronically Signed   By: Nelson Chimes M.D.   On: 12/31/2019 04:54      EKG: Independently reviewed.  RBBB, no ST elevation.  EKG at baseline   A & P   Active Problems:   UTI (urinary tract infection)   1. Confusion secondary to UTI, likely exacerbated by medications a. Afebrile, AAOx3, hemodynamically stable on room air, no leukocytosis b. Positive UA at PCP office c. Continue IV ceftriaxone d. Checking blood culture e. Orthostatic vital signs f. Follow-up urine cultures g. Hold Ambien and Myrbetriq h. Observe overnight, likely discharge in a.m. 2. CAD status post CABG a. EKG at baseline b. Continue aspirin and Lipitor 3. Insomnia a. Hold Ambien 4. BPH a. Continue tamsulosin b. Hold Myrbetriq 5. Hyperthyroidism on methimazole a. Check TSH   DVT prophylaxis: SCDs   Code Status: Full Diet: Heart healthy Family Communication: Admission, patients condition and plan of care including tests being ordered have been discussed with the patient who indicates understanding and agrees with the plan and Code Status. Patient's wife was updated by bedside phone Disposition Plan: The appropriate patient status for this patient is OBSERVATION. Observation status is judged to be reasonable and necessary in order to provide the required intensity of service to ensure the patient's safety. The patient's presenting symptoms, physical exam findings, and initial radiographic and laboratory data in the context of their medical condition is felt to place them at decreased risk for further clinical deterioration. Furthermore, it is anticipated that the patient will be medically stable for discharge from the hospital within 2 midnights of admission. The following factors support the patient status of observation.   " The patient's presenting symptoms include confusion, dysuria. " The  physical  exam findings include AAOx3. " The initial radiographic and laboratory data are unremarkable.    Consultants  . None  Procedures  . None  Time Spent on Admission: 55 minutes   Harold Hedge, DO [facility] 12/31/2019, 8:06 AM

## 2019-12-31 NOTE — ED Triage Notes (Signed)
Patient BIB by [facility] dx of UTI 1 days ago by his PCP prescribed oral Antiboitic,wife called regarding patient having confusion and hallucination but was alert and oriented with EMS , no pain , has nausea but no vomiting

## 2019-12-31 NOTE — Progress Notes (Signed)
   Vital Signs MEWS/VS Documentation      12/31/2019 1642 12/31/2019 1643 12/31/2019 1645 12/31/2019 1701   MEWS Score:  2  3  5   0   MEWS Score Color:  Yellow  Yellow  Red  Green   Resp:  20  --  --  18   Pulse:  (!) 103  (!) 111  (!) 124  96   BP:  137/81  118/70  (!) 77/53  135/77   Temp:  (!) 100.6 F (38.1 C)  --  --  99.8 F (37.7 C)   O2 Device:  Room Air  --  --  Room Air           Imogene Burn 12/31/2019,5:08 PM HCT performing orthostatic vitals on pt per order. Pt's BP dropped from sitting to standing which lowered Blood Pressure, increased HR, and pt had a low grade fever. Pt was not experiencing any difficulties breathing, seeing, no c/o pain. HCT had pt sit back in bed and notified RN. Reassessment complete, no new abnormals, pt AxO-4. Provider notified and he ordered IV fluids. While speaking w/ provider pt's temp, BP, and HR returned within normal limits. Will continue to monitor under [place] guidelines.

## 2020-01-01 DIAGNOSIS — N390 Urinary tract infection, site not specified: Secondary | ICD-10-CM | POA: Diagnosis not present

## 2020-01-01 DIAGNOSIS — R4182 Altered mental status, unspecified: Secondary | ICD-10-CM

## 2020-01-01 DIAGNOSIS — N3000 Acute cystitis without hematuria: Secondary | ICD-10-CM | POA: Diagnosis not present

## 2020-01-01 LAB — CBC
HCT: 36.5 % — ABNORMAL LOW (ref 39.0–52.0)
Hemoglobin: 11.6 g/dL — ABNORMAL LOW (ref 13.0–17.0)
MCH: 26.7 pg (ref 26.0–34.0)
MCHC: 31.8 g/dL (ref 30.0–36.0)
MCV: 83.9 fL (ref 80.0–100.0)
Platelets: 120 10*3/uL — ABNORMAL LOW (ref 150–400)
RBC: 4.35 MIL/uL (ref 4.22–5.81)
RDW: 15.8 % — ABNORMAL HIGH (ref 11.5–15.5)
WBC: 9.8 10*3/uL (ref 4.0–10.5)
nRBC: 0 % (ref 0.0–0.2)

## 2020-01-01 LAB — URINE CULTURE
Culture: <10000 — AB
MICRO NUMBER:: 10147017
SPECIMEN QUALITY:: ADEQUATE
Special Requests: NORMAL

## 2020-01-01 LAB — BASIC METABOLIC PANEL
Anion gap: 9 (ref 5–15)
BUN: 15 mg/dL (ref 8–23)
CO2: 22 mmol/L (ref 22–32)
Calcium: 8.3 mg/dL — ABNORMAL LOW (ref 8.9–10.3)
Chloride: 107 mmol/L (ref 98–111)
Creatinine, Ser: 0.74 mg/dL (ref 0.61–1.24)
GFR calc Af Amer: >60 mL/min (ref >60)
GFR calc non Af Amer: >60 mL/min (ref >60)
Glucose, Bld: 113 mg/dL — ABNORMAL HIGH (ref 70–99)
Potassium: 3.8 mmol/L (ref 3.5–5.1)
Sodium: 138 mmol/L (ref 135–145)

## 2020-01-01 MED ORDER — ASPIRIN EC 81 MG PO TBEC
81.0000 mg | DELAYED_RELEASE_TABLET | Freq: Every day | ORAL | Status: DC
  Administered 2020-01-01: 09:00:00 81 mg via ORAL
  Filled 2020-01-01: qty 1

## 2020-01-01 NOTE — Discharge Summary (Signed)
Physician Discharge Summary  Timothy Gallagher [id number] DOB: [birth year] DOA: 12/31/2019  PCP: [name], MD  Admit date: 12/31/2019 Discharge date: 01/01/2020  Admitted From:home Disposition:  home Recommendations for Outpatient Follow-up:  1. Follow up with PCP in 1-2 weeks 2. Please obtain BMP/CBC in one week  Home Health none Equipment/Devices:none Discharge Condition stable CODE STATUS full Diet recommendation:cardiac Brief/Interim Summary:84 year old male with past medical history of BPH, pyelonephritis/sepsis in September 2018, CAD status post CABG, hyperlipidemia who was recently seen by his PCP on 2/12 for dysuria x2 days and urinary frequency found to have positive UA and started on treatment with Keflex 500 mg 4 times daily however brought in to [facility] ED early a.m. of 2/13 for confusion and hallucinations with nausea but no vomiting.  Had associated chills, sweats.  Currently, patient denies any complaints of dysuria, confusion, hallucinations at this time.  Wife over the phone states patient did not have visual or auditory hallucinations but was very confused and saying "strange things ."  She said this happened last time he had a urinary tract infection.  He did take Ambien last night.  Notable ED labs: No leukocytosis, no anemia, no electrolyte abnormalities, glucose 126.  UA from 2/13 with small leukocytes, > 300 protein.  UA from 2/12: Cloudy, positive nitrites, small positive RBC leukocytes, ED treatments: Ceftriaxone ED imaging: CXR unremarkable, CT brain without acute changes Discharge Diagnoses:  Active Problems:   UTI (urinary tract infection)   #1 altered mental status-likely secondary to partially treated UTI with dehydration in the setting of mild cognitive impairment.  Per wife patient has severe short-term memory loss and starting to get worse. His urine culture had less than 10,000 insignificant growth.  He was started on Keflex 2 days prior to  admission to the hospital.  He was found to be orthostatic treated with IV fluids. He ambulated in the hallway with staff without any dizziness. He will be discharged home I have discussed with the wife to continue the course of Keflex which he started as an outpatient. Follow-up with PCP for memory issues possible early dementia with cognitive impairment. DC Ambien as it can worsen the mental status and dementia.  #2 CAD continue aspirin Lipitor  #3 BPH continue tamsulosin and Myrbetriq.  #4 hyperthyroidism continue methimazole.  Normal TSH.  Estimated body mass index is 25.99 kg/m as calculated from the following:   Height as of this encounter: 6\' 1"  (1.854 m).   Weight as of this encounter: 89.4 kg.  Discharge Instructions   Allergies as of 01/01/2020      Reactions   Lipitor [atorvastatin] Other (See Comments)   REACTION: nausea and blurred vision   Trazodone And Nefazodone Other (See Comments)   dizzy   Ciprofloxacin Swelling   Mycophenolate Mofetil Other (See Comments)   REACTION: unspecified   Amoxicillin Rash   Has patient had a PCN reaction causing immediate rash, facial/tongue/throat swelling, SOB or lightheadedness with hypotension: Unknown Has patient had a PCN reaction causing severe rash involving mucus membranes or skin necrosis: Unknown Has patient had a PCN reaction that required hospitalization: Unknown Has patient had a PCN reaction occurring within the last 10 years: Unknown If all of the above answers are "NO", then may proceed with Cephalosporin use.   Penicillins Rash   Has patient had a PCN reaction causing immediate rash, facial/tongue/throat swelling, SOB or lightheadedness with hypotension: Unknown Has patient had a PCN reaction causing severe rash involving mucus membranes or skin necrosis: Unknown  Has patient had a PCN reaction that required hospitalization: Unknown Has patient had a PCN reaction occurring within the last 10 years: Unknown If all of  the above answers are "NO", then may proceed with Cephalosporin use.   Rosuvastatin Other (See Comments)   Unknown       Medication List    STOP taking these medications   zolpidem 5 MG tablet Commonly known as: AMBIEN     TAKE these medications   acetaminophen 500 MG tablet Commonly known as: TYLENOL Take 500 mg by mouth daily as needed (for pain). Reported on 04/11/2016   aspirin 81 MG EC tablet Take 1 tablet (81 mg total) by mouth daily. What changed: when to take this   atorvastatin 20 MG tablet Commonly known as: LIPITOR TAKE 1 TABLET(20 MG) BY MOUTH DAILY What changed: See the new instructions.   CENTRUM SILVER PO Take 1 tablet by mouth daily.   PRESERVISION AREDS 2 PO Take by mouth.   cephALEXin 500 MG capsule Commonly known as: KEFLEX Take 1 capsule (500 mg total) by mouth 4 (four) times daily.   fluticasone 50 MCG/ACT nasal spray Commonly known as: FLONASE SHAKE LIQUID AND USE 1 TO 2 SPRAYS IN EACH NOSTRIL DAILY AS NEEDED FOR ALLERGIES What changed: See the new instructions.   methimazole 5 MG tablet Commonly known as: TAPAZOLE Take 1 tablet (5 mg total) by mouth daily.   Myrbetriq 50 MG Tb24 tablet Generic drug: mirabegron ER Take 50 mg by mouth daily.   tamsulosin 0.4 MG Caps capsule Commonly known as: FLOMAX Take 0.4 mg by mouth daily.       Allergies  Allergen Reactions  . Lipitor [Atorvastatin] Other (See Comments)    REACTION: nausea and blurred vision  . Trazodone And Nefazodone Other (See Comments)    dizzy  . Ciprofloxacin Swelling  . Mycophenolate Mofetil Other (See Comments)    REACTION: unspecified  . Amoxicillin Rash    Has patient had a PCN reaction causing immediate rash, facial/tongue/throat swelling, SOB or lightheadedness with hypotension: Unknown Has patient had a PCN reaction causing severe rash involving mucus membranes or skin necrosis: Unknown Has patient had a PCN reaction that required hospitalization: Unknown Has  patient had a PCN reaction occurring within the last 10 years: Unknown If all of the above answers are "NO", then may proceed with Cephalosporin use.   [name] Kitchen Penicillins Rash    Has patient had a PCN reaction causing immediate rash, facial/tongue/throat swelling, SOB or lightheadedness with hypotension: Unknown Has patient had a PCN reaction causing severe rash involving mucus membranes or skin necrosis: Unknown Has patient had a PCN reaction that required hospitalization: Unknown Has patient had a PCN reaction occurring within the last 10 years: Unknown If all of the above answers are "NO", then may proceed with Cephalosporin use.   . Rosuvastatin Other (See Comments)    Unknown     Consultations: none Procedures/Studies: CT Head Wo Contrast  Result Date: 12/31/2019 CLINICAL DATA:  Confusion. Recent urinary tract infection. EXAM: CT HEAD WITHOUT CONTRAST TECHNIQUE: Contiguous axial images were obtained from the base of the skull through the vertex without intravenous contrast. COMPARISON:  09/18/08 FINDINGS: Brain: No evidence of acute infarction, hemorrhage, hydrocephalus, extra-axial collection or mass lesion/mass effect. There is mild diffuse low-attenuation within the subcortical and periventricular white matter compatible with chronic microvascular disease. Prominence of the ventricles and CSF spaces identified compatible with brain atrophy. Vascular: No hyperdense vessel or unexpected calcification. Skull: Normal. Negative for fracture  or focal lesion. Sinuses/Orbits: No acute finding. Other: None. IMPRESSION: 1. No acute intracranial abnormalities. 2. Chronic small vessel ischemic disease and brain atrophy. Electronically Signed   By: Kerby Moors M.D.   On: 12/31/2019 05:32   DG Chest Portable 1 View  Result Date: 12/31/2019 CLINICAL DATA:  Confusion and hallucinations. EXAM: PORTABLE CHEST 1 VIEW COMPARISON:  01/10/2018 FINDINGS: Previous median sternotomy and CABG. Heart size is  normal. Chronic aortic atherosclerosis. The lungs are clear. The vascularity is normal. No effusions. IMPRESSION: No active disease.  Previous CABG.  Aortic atherosclerosis. Electronically Signed   By: Nelson Chimes M.D.   On: 12/31/2019 04:54    (Echo, Carotid, EGD, Colonoscopy, ERCP)    Subjective: Awake alert oriented to hospital Discharge Exam: Vitals:   01/01/20 0502 01/01/20 0903  BP: (!) 144/86 (!) 143/85  Pulse: 90 89  Resp: (!) 21 18  Temp: 98.6 F (37 C) (!) 97.4 F (36.3 C)  SpO2: 96% 97%   Vitals:   12/31/19 2207 01/01/20 0113 01/01/20 0502 01/01/20 0903  BP: 138/77 131/83 (!) 144/86 (!) 143/85  Pulse: 86 91 90 89  Resp: (!) 24 (!) 23 (!) 21 18  Temp: 98.6 F (37 C) 98.8 F (37.1 C) 98.6 F (37 C) (!) 97.4 F (36.3 C)  TempSrc: Oral Oral Oral Oral  SpO2: 97% 97% 96% 97%  Weight:      Height:        General: Pt is alert, awake, not in acute distress Cardiovascular: RRR, S1/S2 +, no rubs, no gallops Respiratory: CTA bilaterally, no wheezing, no rhonchi Abdominal: Soft, NT, ND, bowel sounds + Extremities: no edema, no cyanosis    The results of significant diagnostics from this hospitalization (including imaging, microbiology, ancillary and laboratory) are listed below for reference.     Microbiology: Recent Results (from the past 240 hour(s))  Respiratory Panel by RT PCR (Flu A&B, Covid) - Nasopharyngeal Swab     Status: None   Collection Time: 12/31/19  2:22 AM   Specimen: Nasopharyngeal Swab  Result Value Ref Range Status   SARS Coronavirus 2 by RT PCR NEGATIVE NEGATIVE Final    Comment: (NOTE) SARS-CoV-2 target nucleic acids are NOT DETECTED. The SARS-CoV-2 RNA is generally detectable in upper respiratoy specimens during the acute phase of infection. The lowest concentration of SARS-CoV-2 viral copies this assay can detect is 131 copies/mL. A negative result does not preclude SARS-Cov-2 infection and should not be used as the sole basis for  treatment or other patient management decisions. A negative result may occur with  improper specimen collection/handling, submission of specimen other than nasopharyngeal swab, presence of viral mutation(s) within the areas targeted by this assay, and inadequate number of viral copies (<131 copies/mL). A negative result must be combined with clinical observations, patient history, and epidemiological information. The expected result is Negative. Fact Sheet for Patients:  PinkCheek.be Fact Sheet for Healthcare Providers:  GravelBags.it This test is not yet ap proved or cleared by the [place] FDA and  has been authorized for detection and/or diagnosis of SARS-CoV-2 by FDA under an Emergency Use Authorization (EUA). This EUA will remain  in effect (meaning this test can be used) for the duration of the COVID-19 declaration under Section 564(b)(1) of the Act, 21 U.S.C. section 360bbb-3(b)(1), unless the authorization is terminated or revoked sooner.    Influenza A by PCR NEGATIVE NEGATIVE Final   Influenza B by PCR NEGATIVE NEGATIVE Final    Comment: (NOTE) The Xpert Xpress  SARS-CoV-2/FLU/RSV assay is intended as an aid in  the diagnosis of influenza from Nasopharyngeal swab specimens and  should not be used as a sole basis for treatment. Nasal washings and  aspirates are unacceptable for Xpert Xpress SARS-CoV-2/FLU/RSV  testing. Fact Sheet for Patients: PinkCheek.be Fact Sheet for Healthcare Providers: GravelBags.it This test is not yet approved or cleared by the [place] FDA and  has been authorized for detection and/or diagnosis of SARS-CoV-2 by  FDA under an Emergency Use Authorization (EUA). This EUA will remain  in effect (meaning this test can be used) for the duration of the  Covid-19 declaration under Section 564(b)(1) of the Act, 21  U.S.C. section  360bbb-3(b)(1), unless the authorization is  terminated or revoked. Performed at [facility], [place] [address], [place], [place] [address]   Urine culture     Status: Abnormal   Collection Time: 12/31/19  3:32 AM   Specimen: Urine, Random  Result Value Ref Range Status   Specimen Description   Final    URINE, RANDOM Performed at [facility] [address], [place], [place] [address]    Special Requests   Final    Normal Performed at [facility], [place] [address], [place], [place] [address]    Culture (A)  Final    <10,000 COLONIES/mL INSIGNIFICANT GROWTH Performed at [facility] [address] [place], [place] [address]    Report Status 01/01/2020 FINAL  Final     Labs: BNP (last 3 results) No results for input(s): BNP in the last 8760 hours. Basic Metabolic Panel: Recent Labs  Lab 12/31/19 0326 01/01/20 0607  NA 138 138  K 4.2 3.8  CL 102 107  CO2  --  22  GLUCOSE 126* 113*  BUN 16 15  CREATININE 1.00 0.74  CALCIUM  --  8.3*   Liver Function Tests: No results for input(s): AST, ALT, ALKPHOS, BILITOT, PROT, ALBUMIN in the last 168 hours. No results for input(s): LIPASE, AMYLASE in the last 168 hours. No results for input(s): AMMONIA in the last 168 hours. CBC: Recent Labs  Lab 12/31/19 0311 12/31/19 0326 01/01/20 0607  WBC 9.3  --  9.8  NEUTROABS 8.2*  --   --   HGB 13.7 14.6 11.6*  HCT 43.4 43.0 36.5*  MCV 84.1  --  83.9  PLT 155  --  120*   Cardiac Enzymes: No results for input(s): CKTOTAL, CKMB, CKMBINDEX, TROPONINI in the last 168 hours. BNP: Invalid input(s): POCBNP CBG: No results for input(s): GLUCAP in the last 168 hours. D-Dimer No results for input(s): DDIMER in the last 72 hours. Hgb A1c No results for input(s): HGBA1C in the last 72 hours. Lipid Profile No results for input(s): CHOL, HDL, LDLCALC, TRIG, CHOLHDL, LDLDIRECT in the last 72 hours. Thyroid function  studies Recent Labs    12/31/19 1015  TSH 1.719   Anemia work up No results for input(s): VITAMINB12, FOLATE, FERRITIN, TIBC, IRON, RETICCTPCT in the last 72 hours. Urinalysis    Component Value Date/Time   COLORURINE YELLOW 12/31/2019 0332   APPEARANCEUR CLEAR 12/31/2019 0332   LABSPEC 1.018 12/31/2019 0332   PHURINE 6.0 12/31/2019 0332   GLUCOSEU NEGATIVE 12/31/2019 0332   GLUCOSEU NEGATIVE 08/17/2017 1419   HGBUR SMALL (A) 12/31/2019 0332   BILIRUBINUR NEGATIVE 12/31/2019 0332   BILIRUBINUR positive 12/30/2019 1536   KETONESUR 5 (A) 12/31/2019 0332   PROTEINUR >=300 (A) 12/31/2019 0332   UROBILINOGEN 0.2 12/30/2019  1536   UROBILINOGEN 0.2 08/17/2017 1419   NITRITE NEGATIVE 12/31/2019 0332   LEUKOCYTESUR SMALL (A) 12/31/2019 0332   Sepsis Labs Invalid input(s): PROCALCITONIN,  WBC,  LACTICIDVEN Microbiology Recent Results (from the past 240 hour(s))  Respiratory Panel by RT PCR (Flu A&B, Covid) - Nasopharyngeal Swab     Status: None   Collection Time: 12/31/19  2:22 AM   Specimen: Nasopharyngeal Swab  Result Value Ref Range Status   SARS Coronavirus 2 by RT PCR NEGATIVE NEGATIVE Final    Comment: (NOTE) SARS-CoV-2 target nucleic acids are NOT DETECTED. The SARS-CoV-2 RNA is generally detectable in upper respiratoy specimens during the acute phase of infection. The lowest concentration of SARS-CoV-2 viral copies this assay can detect is 131 copies/mL. A negative result does not preclude SARS-Cov-2 infection and should not be used as the sole basis for treatment or other patient management decisions. A negative result may occur with  improper specimen collection/handling, submission of specimen other than nasopharyngeal swab, presence of viral mutation(s) within the areas targeted by this assay, and inadequate number of viral copies (<131 copies/mL). A negative result must be combined with clinical observations, patient history, and epidemiological information.  The expected result is Negative. Fact Sheet for Patients:  PinkCheek.be Fact Sheet for Healthcare Providers:  GravelBags.it This test is not yet ap proved or cleared by the [place] FDA and  has been authorized for detection and/or diagnosis of SARS-CoV-2 by FDA under an Emergency Use Authorization (EUA). This EUA will remain  in effect (meaning this test can be used) for the duration of the COVID-19 declaration under Section 564(b)(1) of the Act, 21 U.S.C. section 360bbb-3(b)(1), unless the authorization is terminated or revoked sooner.    Influenza A by PCR NEGATIVE NEGATIVE Final   Influenza B by PCR NEGATIVE NEGATIVE Final    Comment: (NOTE) The Xpert Xpress SARS-CoV-2/FLU/RSV assay is intended as an aid in  the diagnosis of influenza from Nasopharyngeal swab specimens and  should not be used as a sole basis for treatment. Nasal washings and  aspirates are unacceptable for Xpert Xpress SARS-CoV-2/FLU/RSV  testing. Fact Sheet for Patients: PinkCheek.be Fact Sheet for Healthcare Providers: GravelBags.it This test is not yet approved or cleared by the [place] FDA and  has been authorized for detection and/or diagnosis of SARS-CoV-2 by  FDA under an Emergency Use Authorization (EUA). This EUA will remain  in effect (meaning this test can be used) for the duration of the  Covid-19 declaration under Section 564(b)(1) of the Act, 21  U.S.C. section 360bbb-3(b)(1), unless the authorization is  terminated or revoked. Performed at [facility], [place] [address], [place], [place] [address]   Urine culture     Status: Abnormal   Collection Time: 12/31/19  3:32 AM   Specimen: Urine, Random  Result Value Ref Range Status   Specimen Description   Final    URINE, RANDOM Performed at [facility] [address]  [place], [place] [address]    Special Requests   Final    Normal Performed at [facility], [place] [address] [place], [place] [address]    Culture (A)  Final    <10,000 COLONIES/mL INSIGNIFICANT GROWTH Performed at [facility] [address], [place], [place] [address]    Report Status 01/01/2020 FINAL  Final     Time coordinating discharge:  39 minutes  SIGNED:   Georgette Shell, MD  [facility] 01/01/2020, 10:29 AM Pager  If 7PM-7AM, please contact night-coverage [email or web address] Password [email or web address]

## 2020-01-01 NOTE — Progress Notes (Signed)
Pt being discharged to home with wife. Discharge information and medication education provided to pt.

## 2020-01-02 ENCOUNTER — Ambulatory Visit (HOSPITAL_COMMUNITY): Payer: Medicare Other

## 2020-01-03 ENCOUNTER — Telehealth: Payer: Self-pay | Admitting: Interventional Cardiology

## 2020-01-03 NOTE — Telephone Encounter (Signed)
Patient is calling to inquire about whether or not it is necessary for him to have a scan prior to his appointment scheduled for 01/11/20. Please call to advise.

## 2020-01-03 NOTE — Telephone Encounter (Signed)
Called the patient to arrange 1 year AAA duplex prior to 2/24 visit with Dr. [name]. The only availability for duplex is tomorrow at 1000. The patient states he cannot make that appointment.  He and his wife wish to reschedule both duplex AND Dr. Thompson Caul visit so it can be done prior to visit.  They understand Dr. Thompson Caul nurse will call to schedule.

## 2020-01-04 ENCOUNTER — Other Ambulatory Visit (HOSPITAL_COMMUNITY): Payer: Medicare Other

## 2020-01-04 NOTE — Telephone Encounter (Signed)
Spoke with wife and made her aware his AAA has been scheduled for 2/23 at 4pm and we will keep the appt with Dr. [name] on 2/24.  Wife appreciative for call.

## 2020-01-05 ENCOUNTER — Ambulatory Visit: Payer: Medicare Other

## 2020-01-05 LAB — CULTURE, BLOOD (SINGLE)
Culture: NO GROWTH
Special Requests: ADEQUATE

## 2020-01-10 ENCOUNTER — Ambulatory Visit (HOSPITAL_COMMUNITY): Payer: Medicare Other

## 2020-01-10 NOTE — Progress Notes (Signed)
Cardiology Office Note:    Date:  01/11/2020   ID:  Timothy Gallagher, DOB 04-12-1936, MRN [id number]  PCP:  [name], MD  Cardiologist:  Sinclair Grooms, MD   Referring MD: [name], MD   Chief Complaint  Patient presents with  . Coronary Artery Disease    History of Present Illness:    Timothy Gallagher is a 84 y.o. male with a hx of coronary artery disease, history of CABG (LIMA-LAD, SVG-PDA, SVG-OM, SVG-diagonal) by Dr. [id number], hyperlipidemia, AAA (3.3 cm), hypertension, and history of kidney disease.  He denies chest pain, orthopnea, PND, palpitations, and syncope.  There is no peripheral edema.  He can remember that he had previous coronary bypass surgery but after reminded, he did remember the 4 vessels were bypassed.  He denies claudication.  He has not had syncope.  Past Medical History:  Diagnosis Date  . ACNE ROSACEA 06/27/2009  . ACTINIC KERATOSIS, HEAD 04/18/2009  . Acute maxillary sinusitis 05/14/2010  . ALLERGIC RHINITIS 04/09/2007  . Anal fissure 04/07/2016  . B12 DEFICIENCY 06/07/2007  . BACK PAIN WITH RADICULOPATHY 04/24/2008  . Cancer ([place])    skin  . CHEST WALL PAIN, ACUTE 06/15/2009  . Chronic maxillary sinusitis 05/29/2008  . COLITIS 04/27/2009  . COLONIC POLYPS, HX OF 04/27/2009   tubular adenomas  . Coronary artery disease 05/12/2014   Cath 05/12/2014 w/ severe 3-vessel CAD and preserved LV function, EF 55%  . DERMATITIS, ATOPIC 10/12/2007  . DIVERTICULOSIS, COLON 04/27/2009  . ECCHYMOSES, SPONTANEOUS 06/27/2008  . Elevated sedimentation rate 05/02/2009  . ESOPHAGEAL STRICTURE 04/27/2009  . GASTRITIS, CHRONIC 04/27/2009  . HYPERLIPIDEMIA 03/13/2008  . HYPERTENSION 04/09/2007  . Internal bleeding hemorrhoids 01/22/2015   01/22/2015 Seen at anoscopy, grade 1 all 3 positions   . Irritable bowel syndrome 08/11/2007  . KIDNEY DISEASE 04/09/2007  . NECK PAIN 09/14/2008  . NEUROPATHY, IDIOPATHIC PERIPHERAL NEC 08/11/2007  . OSTEOARTHRITIS, WRIST, RIGHT  08/05/2010  . Postoperative delirium 05/20/2014  . S/P CABG x 4 05/19/2014   LIMA to LAD, SVG to diag, SVG to OM, SVG to PDA, EVH via right thigh and leg    Past Surgical History:  Procedure Laterality Date  . CARDIAC CATHETERIZATION    . COLONOSCOPY    . CORONARY ARTERY BYPASS GRAFT N/A 05/19/2014   Procedure: CORONARY ARTERY BYPASS GRAFTING (CABG);  Surgeon: Rexene Alberts, MD;  Location: [facility];  Service: Open Heart Surgery;  Laterality: N/A;  Times 4 using left internal mammary artery and endoscopically harvested right saphenous vein  . ESOPHAGOGASTRODUODENOSCOPY    . FINGER SURGERY     cut off end of finger  . FLEXIBLE SIGMOIDOSCOPY    . HEMORRHOID BANDING    . HERNIA REPAIR    . INCISION AND DRAINAGE WOUND WITH FOREIGN BODY REMOVAL Left 12/20/2013   Procedure: INCISION AND DRAINAGE LEFT INDEX FINGER;  Surgeon: Tennis Must, MD;  Location: [facility];  Service: Orthopedics;  Laterality: Left;  . INTRAOPERATIVE TRANSESOPHAGEAL ECHOCARDIOGRAM N/A 05/19/2014   Procedure: INTRAOPERATIVE TRANSESOPHAGEAL ECHOCARDIOGRAM;  Surgeon: Rexene Alberts, MD;  Location: [place];  Service: Open Heart Surgery;  Laterality: N/A;  . lamenectomy    . LEFT HEART CATHETERIZATION WITH CORONARY ANGIOGRAM N/A 05/12/2014   Procedure: LEFT HEART CATHETERIZATION WITH CORONARY ANGIOGRAM;  Surgeon: Sinclair Grooms, MD;  Location: [facility] C[facility];  Service: Cardiovascular;  Laterality: N/A;  . LUMBAR FUSION    . TONSILLECTOMY    . VARICOSE VEIN  SURGERY Left     Current Medications: Current Meds  Medication Sig  . acetaminophen (TYLENOL) 500 MG tablet Take 500 mg by mouth daily as needed (for pain). Reported on 04/11/2016  . aspirin 81 MG EC tablet Take 1 tablet (81 mg total) by mouth daily.  [name] Kitchen atorvastatin (LIPITOR) 20 MG tablet TAKE 1 TABLET(20 MG) BY MOUTH DAILY  . methimazole (TAPAZOLE) 5 MG tablet Take 1 tablet (5 mg total) by mouth daily.  . Multiple Vitamins-Minerals (CENTRUM SILVER PO) Take 1 tablet by mouth daily.      Allergies:   Lipitor [atorvastatin], Trazodone and nefazodone, Ciprofloxacin, Mycophenolate mofetil, Amoxicillin, Penicillins, and Rosuvastatin   Social History   Socioeconomic History  . Marital status: Married    Spouse name: Not on file  . Number of children: 4  . Years of education: Not on file  . Highest education level: Not on file  Occupational History  . Occupation: retired    Fish farm manager: RETIRED    Comment: from [place] Use  . Smoking status: Former Smoker    Packs/day: 0.50    Years: 42.00    Pack years: 21.00    Types: Cigarettes    Quit date: 11/17/1998    Years since quitting: 21.1  . Smokeless tobacco: Never Used  Substance and Sexual Activity  . Alcohol use: No  . Drug use: No  . Sexual activity: Yes  Other Topics Concern  . Not on file  Social History Narrative   Married 35 years (2nd marriage). 2 kids from previous marriage. 2 stepchildren. 8 grandkids.       Retired from [employer]: walking/exercise, building in shop-furniture (cut finger off in February doing this)   6 grand children - Has bought them all a new car    Brother died of probably addiction   Sister also died; not sure of cause    He enjoys his life; Likes to play the keyboard; guitar    Had played in a gospel quartet    Enjoys working with wood    Social Determinants of Radio broadcast assistant Strain:   . Difficulty of Paying Living Expenses: Not on file  Food Insecurity:   . Worried About Charity fundraiser in the Last Year: Not on file  . Ran Out of Food in the Last Year: Not on file  Transportation Needs:   . Lack of Transportation (Medical): Not on file  . Lack of Transportation (Non-Medical): Not on file  Physical Activity:   . Days of Exercise per Week: Not on file  . Minutes of Exercise per Session: Not on file  Stress:   . Feeling of Stress : Not on file  Social Connections:   . Frequency of Communication with Friends and  Family: Not on file  . Frequency of Social Gatherings with Friends and Family: Not on file  . Attends Religious Services: Not on file  . Active Member of Clubs or Organizations: Not on file  . Attends Archivist Meetings: Not on file  . Marital Status: Not on file     Family History: The patient's family history includes COPD in his father; Early death in his daughter; Heart attack in his brother; Heart disease in his father; Hernia in his mother. There is no history of Colon cancer.  ROS:   Please see the history of present illness.    Decreasing memory but not troublesome to him.  He had a COVID-19 vaccine.  He says he forgets things like his wife's birthday his son's birthday is names of people etc.  This has not troubled him.  All other systems reviewed and are negative.  EKGs/Labs/Other Studies Reviewed:    The following studies were reviewed today: No new data  EKG:  EKG normal sinus rhythm, right bundle branch block, left axis deviation.  On the tracing performed in February 2021.  Recent Labs: 12/31/2019: TSH 1.719 01/11/2020: ALT 20; BUN 13; Creatinine, Ser 0.87; Hemoglobin 11.7; Platelets 235.0; Potassium 4.8; Sodium 142  Recent Lipid Panel    Component Value Date/Time   CHOL 121 04/28/2019 1149   CHOL 120 02/03/2018 0857   TRIG 90.0 04/28/2019 1149   HDL 43.70 04/28/2019 1149   HDL 51 02/03/2018 0857   CHOLHDL 3 04/28/2019 1149   VLDL 18.0 04/28/2019 1149   LDLCALC 59 04/28/2019 1149   LDLCALC 57 02/03/2018 0857   LDLDIRECT 91.0 05/23/2016 1346    Physical Exam:    VS:  BP (!) 142/76   Pulse 75   Ht 6\' 2"  (1.88 m)   Wt 194 lb (88 kg)   SpO2 97%   BMI 24.91 kg/m     Wt Readings from Last 3 Encounters:  01/11/20 194 lb (88 kg)  01/11/20 192 lb 9.6 oz (87.4 kg)  12/31/19 197 lb (89.4 kg)     GEN: Slender.. No acute distress HEENT: Normal NECK: No JVD. LYMPHATICS: No lymphadenopathy CARDIAC:  RRR without murmur, gallop, or edema. VASCULAR:   Normal Pulses. No bruits. RESPIRATORY:  Clear to auscultation without rales, wheezing or rhonchi  ABDOMEN: Soft, non-tender, non-distended, No pulsatile mass, MUSCULOSKELETAL: No deformity  SKIN: Warm and dry NEUROLOGIC:  Alert and oriented x 3.  Decreased memory. PSYCHIATRIC:  Normal affect   ASSESSMENT:    1. Coronary artery disease involving coronary bypass graft of native heart without angina pectoris   2. Abdominal aortic aneurysm (AAA) without rupture ([place])   3. Claudication in peripheral vascular disease ([place])   4. Essential hypertension   5. Hyperlipidemia, unspecified hyperlipidemia type   6. Palpitations   7. Educated about COVID-19 virus infection    PLAN:    In order of problems listed above:  1. Secondary prevention is discussed. 2. This needs to be followed. 3. Denies claudication. 4. Blood pressure is upper normal for his age range.  Low-salt diet. 5. LDL target less than 70.  Pulse most recently 38 in June 2020. 6. No complaints of palpitations. 7. He has received the COVID-19 vaccine.  He is practicing social distancing and mask wearing.  Overall education and awareness concerning primary/secondary risk prevention was discussed in detail: LDL less than 70, hemoglobin A1c less than 7, blood pressure target less than 130/80 mmHg, >150 minutes of moderate aerobic activity per week, avoidance of smoking, weight control (via diet and exercise), and continued surveillance/management of/for obstructive sleep apnea.    Medication Adjustments/Labs and Tests Ordered: Current medicines are reviewed at length with the patient today.  Concerns regarding medicines are outlined above.  No orders of the defined types were placed in this encounter.  No orders of the defined types were placed in this encounter.   There are no Patient Instructions on file for this visit.   Signed, Sinclair Grooms, MD  01/11/2020 4:32 PM    [facility]

## 2020-01-11 ENCOUNTER — Ambulatory Visit (INDEPENDENT_AMBULATORY_CARE_PROVIDER_SITE_OTHER): Payer: Medicare Other | Admitting: Family Medicine

## 2020-01-11 ENCOUNTER — Encounter: Payer: Self-pay | Admitting: Family Medicine

## 2020-01-11 ENCOUNTER — Ambulatory Visit: Payer: Medicare Other | Admitting: Interventional Cardiology

## 2020-01-11 ENCOUNTER — Other Ambulatory Visit: Payer: Self-pay

## 2020-01-11 ENCOUNTER — Encounter: Payer: Self-pay | Admitting: Interventional Cardiology

## 2020-01-11 VITALS — BP 142/76 | HR 75 | Ht 74.0 in | Wt 194.0 lb

## 2020-01-11 VITALS — BP 132/78 | HR 78 | Temp 98.3°F | Ht 73.0 in | Wt 192.6 lb

## 2020-01-11 DIAGNOSIS — Z7189 Other specified counseling: Secondary | ICD-10-CM

## 2020-01-11 DIAGNOSIS — R002 Palpitations: Secondary | ICD-10-CM

## 2020-01-11 DIAGNOSIS — N401 Enlarged prostate with lower urinary tract symptoms: Secondary | ICD-10-CM

## 2020-01-11 DIAGNOSIS — I714 Abdominal aortic aneurysm, without rupture, unspecified: Secondary | ICD-10-CM

## 2020-01-11 DIAGNOSIS — E538 Deficiency of other specified B group vitamins: Secondary | ICD-10-CM

## 2020-01-11 DIAGNOSIS — E785 Hyperlipidemia, unspecified: Secondary | ICD-10-CM

## 2020-01-11 DIAGNOSIS — N390 Urinary tract infection, site not specified: Secondary | ICD-10-CM | POA: Diagnosis not present

## 2020-01-11 DIAGNOSIS — I739 Peripheral vascular disease, unspecified: Secondary | ICD-10-CM | POA: Diagnosis not present

## 2020-01-11 DIAGNOSIS — R410 Disorientation, unspecified: Secondary | ICD-10-CM

## 2020-01-11 DIAGNOSIS — I480 Paroxysmal atrial fibrillation: Secondary | ICD-10-CM

## 2020-01-11 DIAGNOSIS — I25810 Atherosclerosis of coronary artery bypass graft(s) without angina pectoris: Secondary | ICD-10-CM

## 2020-01-11 DIAGNOSIS — I10 Essential (primary) hypertension: Secondary | ICD-10-CM

## 2020-01-11 DIAGNOSIS — J449 Chronic obstructive pulmonary disease, unspecified: Secondary | ICD-10-CM

## 2020-01-11 DIAGNOSIS — G4700 Insomnia, unspecified: Secondary | ICD-10-CM

## 2020-01-11 DIAGNOSIS — R351 Nocturia: Secondary | ICD-10-CM

## 2020-01-11 DIAGNOSIS — E0590 Thyrotoxicosis, unspecified without thyrotoxic crisis or storm: Secondary | ICD-10-CM

## 2020-01-11 LAB — CBC WITH DIFFERENTIAL/PLATELET
Basophils Absolute: 0 10*3/uL (ref 0.0–0.1)
Basophils Relative: 0.5 % (ref 0.0–3.0)
Eosinophils Absolute: 0.1 10*3/uL (ref 0.0–0.7)
Eosinophils Relative: 2.2 % (ref 0.0–5.0)
HCT: 36.3 % — ABNORMAL LOW (ref 39.0–52.0)
Hemoglobin: 11.7 g/dL — ABNORMAL LOW (ref 13.0–17.0)
Lymphocytes Relative: 31.2 % (ref 12.0–46.0)
Lymphs Abs: 1.8 10*3/uL (ref 0.7–4.0)
MCHC: 32.3 g/dL (ref 30.0–36.0)
MCV: 81.6 fl (ref 78.0–100.0)
Monocytes Absolute: 0.4 10*3/uL (ref 0.1–1.0)
Monocytes Relative: 7.7 % (ref 3.0–12.0)
Neutro Abs: 3.4 10*3/uL (ref 1.4–7.7)
Neutrophils Relative %: 58.4 % (ref 43.0–77.0)
Platelets: 235 10*3/uL (ref 150.0–400.0)
RBC: 4.45 Mil/uL (ref 4.22–5.81)
RDW: 16.1 % — ABNORMAL HIGH (ref 11.5–15.5)
WBC: 5.7 10*3/uL (ref 4.0–10.5)

## 2020-01-11 LAB — COMPREHENSIVE METABOLIC PANEL
ALT: 20 U/L (ref 0–53)
AST: 19 U/L (ref 0–37)
Albumin: 3.4 g/dL — ABNORMAL LOW (ref 3.5–5.2)
Alkaline Phosphatase: 75 U/L (ref 39–117)
BUN: 13 mg/dL (ref 6–23)
CO2: 28 mEq/L (ref 19–32)
Calcium: 9 mg/dL (ref 8.4–10.5)
Chloride: 107 mEq/L (ref 96–112)
Creatinine, Ser: 0.87 mg/dL (ref 0.40–1.50)
GFR: 84.11 mL/min (ref >60.00)
Glucose, Bld: 97 mg/dL (ref 70–99)
Potassium: 4.8 mEq/L (ref 3.5–5.1)
Sodium: 142 mEq/L (ref 135–145)
Total Bilirubin: 0.4 mg/dL (ref 0.2–1.2)
Total Protein: 6.4 g/dL (ref 6.0–8.3)

## 2020-01-11 MED ORDER — CYANOCOBALAMIN 1000 MCG/ML IJ SOLN
1000.0000 ug | Freq: Once | INTRAMUSCULAR | Status: AC
  Administered 2020-01-11: 10:00:00 1000 ug via INTRAMUSCULAR

## 2020-01-11 NOTE — Progress Notes (Signed)
Phone [phone]   Subjective:  Timothy Gallagher is a 84 y.o. year old very pleasant male patient who presents for transitional care management (but BCBS does not cover) and hospital follow up for lower UTI. Patient was hospitalized from 12/31/19 to 01/01/20.  84 year old male with history of pyelonephritis/sepsis in September 2018 as well as CAD status post CABG who presented to the hospital with UTI.  He had seen me on the day prior and was started on Keflex 500 mg 4 times a day but was eventually brought into the hospital due to confusion  with nausea but no vomiting.  Also had chills and sweats.  By the time he arrived in the hospital he was not having confusion but wife reported he had been very confused and saying "strange things".  He did take an Ambien for sleep at night  Patient was thought to have altered mental status related to partially treated UTI along with dehydration in the setting of mild cognitive impairment.  Wife reported during hospitalization he has had some short-term memory issues and starting to get worse overall.  Urine culture by time of admission had insignificant growth.  He was orthostatic during hospitalization received IV fluids.  He was discharged home on Keflex with plan for outpatient follow-up.  Due to memory issues Ambien was stopped which is completely appropriate. Reports has slept ok without ambien  For coronary artery disease was continued on aspirin and Lipitor.  For BPH was continued on tamsulosin -hopefully tamsulosin helps prevent more significant illness like he had in 2018.  For hypothyroidism he was continued on methimazole and TSH was normal  For b12 deficiency was due today.   He reports no dysuria or polyuria more than normal    See problem oriented charting as well  Past Medical History-  Patient Active Problem List   Diagnosis Date Noted  . Hyperthyroidism 01/13/2018    Priority: High  . Detached retina, left 06/30/2017     Priority: High  . AAA (abdominal aortic aneurysm) ([place]) 05/12/2017    Priority: High  . CAD (coronary artery disease) of artery bypass graft 05/19/2014    Priority: High  . BPPV (benign paroxysmal positional vertigo) 09/01/2016    Priority: Medium  . Insomnia 11/22/2014    Priority: Medium  . Anemia 07/20/2014    Priority: Medium  . BPH (benign prostatic hyperplasia) 12/05/2013    Priority: Medium  . Hyperlipidemia 03/13/2008    Priority: Medium  . B12 deficiency 06/07/2007    Priority: Medium  . Essential hypertension 04/09/2007    Priority: Medium  . Chronic obstructive pulmonary disease ([place]) 08/26/2019    Priority: Low  . Paroxysmal atrial fibrillation ([place]) 08/26/2019    Priority: Low  . Left sided abdominal pain 04/15/2017    Priority: Low  . Low back pain 02/06/2017    Priority: Low  . Anal fissure 04/07/2016    Priority: Low  . Internal and external bleeding hemorrhoids 01/22/2015    Priority: Low  . Palpitations 02/11/2013    Priority: Low  . OSTEOARTHRITIS, WRIST, RIGHT 08/05/2010    Priority: Low  . ACNE ROSACEA 06/27/2009    Priority: Low  . ESOPHAGEAL STRICTURE 04/27/2009    Priority: Low  . ACTINIC KERATOSIS, HEAD 04/18/2009    Priority: Low  . NECK PAIN 09/14/2008    Priority: Low  . NEUROPATHY, IDIOPATHIC PERIPHERAL NEC 08/11/2007    Priority: Low  . Irritable bowel syndrome 08/11/2007    Priority: Low  . ALLERGIC  RHINITIS 04/09/2007    Priority: Low    Medications- reviewed and updated  A medical reconciliation was performed comparing current medicines to hospital discharge medications. Current Outpatient Medications  Medication Sig Dispense Refill  . acetaminophen (TYLENOL) 500 MG tablet Take 500 mg by mouth daily as needed (for pain). Reported on 04/11/2016    . aspirin 81 MG EC tablet Take 1 tablet (81 mg total) by mouth daily.    [name] Kitchen atorvastatin (LIPITOR) 20 MG tablet TAKE 1 TABLET(20 MG) BY MOUTH DAILY 90 tablet 3  . methimazole (TAPAZOLE)  5 MG tablet Take 1 tablet (5 mg total) by mouth daily. 30 tablet 3  . Multiple Vitamins-Minerals (CENTRUM SILVER PO) Take 1 tablet by mouth daily.    . tamsulosin (FLOMAX) 0.4 MG CAPS capsule Take 0.4 mg by mouth. Prescribed by urology     No current facility-administered medications for this visit.   Objective  Objective:  BP 132/78   Pulse 78   Temp 98.3 F (36.8 C)   Ht 6\' 1"  (1.854 m)   Wt 192 lb 9.6 oz (87.4 kg)   SpO2 97%   BMI 25.41 kg/m  Gen: NAD, resting comfortably CV: RRR no murmurs rubs or gallops Lungs: CTAB no crackles, wheeze, rhonchi Abdomen: soft/nontender/nondistended/normal bowel sounds. No rebound or guarding.  Ext: no edema Skin: warm, dry Neuro: grossly normal, moves all extremities, no obvious cognitive issues   Assessment and Plan:   Hospital follow-up/not a TCM as has [employer]  Lower urinary tract infectious disease Patient with UTI from E. coli noted on culture on 12/30/19 when he was started on Keflex-luckily it was pansensitive.  Unfortunately he ended up developing altered mental status leading to hospitalization-this was likely combination of UTI plus dehydration-he felt much better once rehydrated.  Ambien may also have contributed to his symptoms of confusion and that was permanently discontinued  BPH (benign prostatic hyperplasia) BPH likely contributes to patient's propensity for UTI-when charting I noted that tamsulosin was no longer on his list-I called and confirmed that he is still taking this and it has been prescribed by Dr. McDermott-I would prefer for him to remain on this as it seems to have at least prevented more severe infections like the one he had in 2018.  Insomnia Temazepam-insurance no longer covering.  Trial of Ambien are effective but may have contributed to altered mental status with UTI February 2021.  Fortunately patient is sleeping okay off of Ambien and reports he actually feels like he is feeling better  overall-I recommended we permanently discontinue Ambien -Previously failed trazodone in 2016 -In 2019 reported failing Benadryl and melatonin.  CAD (coronary artery disease) of artery bypass graft Patient is compliant with aspirin and atorvastatin.  Last LDL was good control at 59 last June-likely repeat June or later.  Patient did not report chest pain or shortness of breath.  Asymptomatic-continue current medications   AAA (abdominal aortic aneurysm) (HCC) Blood pressure slightly high considering history of infrarenal abdominal aortic aneurysm.  With that being said has had issues with orthostatic hypotension so I do not feel strongly about adding blood pressure medicine at this time-we will continue current medicine and repeat aneurysm evaluation in June or later of this year  B12 deficiency B12 has dropped in the past with oral supplementation-we updated B12 monthly injection today.  Chronic obstructive pulmonary disease ([place]) Patient denies issues with shortness of breath or wheezing.  Noted on imaging in 2015-continue to monitor  Paroxysmal atrial fibrillation (  [place]) No obvious recurrence.  History of postoperative atrial fibrillation I believe in 2015.  He will remain off anticoagulation unless has recurrence.  He did not report any palpitations today.  Continue to monitor without anticoagulant/medication  Hyperthyroidism Remains on methimazole-most recent thyroid levels were reassuring Lab Results  Component Value Date   TSH 1.719 12/31/2019      Recommended follow up: Has follow-up in April schedule Future Appointments  Date Time Provider [place]  01/24/2020  9:00 AM [facility] 3 [facility]  02/24/2020 10:40 AM [name], MD [facility]  07/25/2020 10:45 AM Renato Shin, MD [facility] None    Lab/Order associations:   ICD-10-CM   1. Lower urinary tract infectious disease  N39.0   2. Delirium  R41.0   3. Coronary artery disease involving coronary  bypass graft of native heart without angina pectoris  I25.810   4. Hyperlipidemia, unspecified hyperlipidemia type  E78.5   5. Essential hypertension  I10 CBC with Differential/Platelet    Comprehensive metabolic panel  6. Abdominal aortic aneurysm (AAA) without rupture (HCC)  I71.4   7. Benign prostatic hyperplasia with nocturia  N40.1    R35.1   8. Insomnia, unspecified type  G47.00   9. B12 deficiency  E53.8 cyanocobalamin ((VITAMIN B-12)) injection 1,000 mcg  10. Chronic obstructive pulmonary disease, unspecified COPD type ([place])  J44.9   11. Paroxysmal atrial fibrillation (HCC) Chronic I48.0     Meds ordered this encounter  Medications  . cyanocobalamin ((VITAMIN B-12)) injection 1,000 mcg    Return precautions advised.  [name], MD

## 2020-01-11 NOTE — Assessment & Plan Note (Signed)
Patient denies issues with shortness of breath or wheezing.  Noted on imaging in 2015-continue to monitor

## 2020-01-11 NOTE — Patient Instructions (Addendum)
So glad you are doing better  You told me you are not having issues with your memory anymore- if you do have these again or wife is concerned we can do further memory testing.   Stay off the [place] as that seems to be helping.   Follow up with new or worsening symptoms  Please stop by lab before you go If you do not have mychart- we will call you about results within 5 business days of [place] receiving them.  If you have mychart- we will send your results within 3 business days of [place] receiving them.  If abnormal or we want to clarify a result, we will call or mychart you to make sure you receive the message.  If you have questions or concerns or don't hear within 5 business days, please send [place] a message or call us.

## 2020-01-11 NOTE — Assessment & Plan Note (Signed)
Temazepam-insurance no longer covering.  Trial of Ambien are effective but may have contributed to altered mental status with UTI February 2021.  Fortunately patient is sleeping okay off of Ambien and reports he actually feels like he is feeling better overall-I recommended we permanently discontinue Ambien -Previously failed trazodone in 2016 -In 2019 reported failing Benadryl and melatonin.

## 2020-01-11 NOTE — Assessment & Plan Note (Signed)
BPH likely contributes to patient's propensity for UTI-when charting I noted that tamsulosin was no longer on his list-I called and confirmed that he is still taking this and it has been prescribed by Dr. McDermott-I would prefer for him to remain on this as it seems to have at least prevented more severe infections like the one he had in 2018.

## 2020-01-11 NOTE — Assessment & Plan Note (Signed)
Patient with UTI from E. coli noted on culture on 12/30/19 when he was started on Keflex-luckily it was pansensitive.  Unfortunately he ended up developing altered mental status leading to hospitalization-this was likely combination of UTI plus dehydration-he felt much better once rehydrated.  Ambien may also have contributed to his symptoms of confusion and that was permanently discontinued

## 2020-01-11 NOTE — Assessment & Plan Note (Signed)
B12 has dropped in the past with oral supplementation-we updated B12 monthly injection today.

## 2020-01-11 NOTE — Assessment & Plan Note (Signed)
No obvious recurrence.  History of postoperative atrial fibrillation I believe in 2015.  He will remain off anticoagulation unless has recurrence.  He did not report any palpitations today.  Continue to monitor without anticoagulant/medication

## 2020-01-11 NOTE — Assessment & Plan Note (Signed)
Remains on methimazole-most recent thyroid levels were reassuring Lab Results  Component Value Date   TSH 1.719 12/31/2019

## 2020-01-11 NOTE — Patient Instructions (Signed)

## 2020-01-11 NOTE — Assessment & Plan Note (Signed)
Blood pressure slightly high considering history of infrarenal abdominal aortic aneurysm.  With that being said has had issues with orthostatic hypotension so I do not feel strongly about adding blood pressure medicine at this time-we will continue current medicine and repeat aneurysm evaluation in June or later of this year

## 2020-01-11 NOTE — Assessment & Plan Note (Signed)
Patient is compliant with aspirin and atorvastatin.  Last LDL was good control at 59 last June-likely repeat June or later.  Patient did not report chest pain or shortness of breath.  Asymptomatic-continue current medications

## 2020-01-12 ENCOUNTER — Ambulatory Visit: Payer: Medicare Other

## 2020-01-12 ENCOUNTER — Telehealth: Payer: Self-pay

## 2020-01-12 NOTE — Telephone Encounter (Signed)
Patient was told by [name] that if he wasn't feeling better [name] would call him in some medication regarding his kidney.

## 2020-01-12 NOTE — Telephone Encounter (Signed)
Called and spoke with patient, advised that [name] is out of the office until Monday. He said that he is fine to wait until he returns. Told pt that if having any sx of UTI or change with memory, he would be evaluated at the ED or urgent care. Pt verbalized understanding.   Forwarding to [name] to address when he returns on Monday, pt aware.

## 2020-01-16 ENCOUNTER — Other Ambulatory Visit: Payer: Self-pay

## 2020-01-16 NOTE — Progress Notes (Signed)
Error

## 2020-01-16 NOTE — Telephone Encounter (Signed)
What symptoms is he having? Lets get him in ASAP for UA and urine culture as well as urine gram stain- I assuem frequent urination?

## 2020-01-16 NOTE — Telephone Encounter (Signed)
Called and f/u with pt and pt states everything has cleared up and he is no longer having symptoms. He did not wish to leave a urine sample since he was better. I informed him to call us back if anything changes.

## 2020-01-18 ENCOUNTER — Telehealth: Payer: Self-pay | Admitting: Family Medicine

## 2020-01-18 NOTE — Telephone Encounter (Signed)
Patient states he is using the bathroom all night long. [name] called in something for him for this problem in the past. Can he call it in to the pharmacy again. Patients number is [phone].

## 2020-01-19 ENCOUNTER — Other Ambulatory Visit: Payer: Self-pay

## 2020-01-19 ENCOUNTER — Other Ambulatory Visit (INDEPENDENT_AMBULATORY_CARE_PROVIDER_SITE_OTHER): Payer: Medicare Other

## 2020-01-19 DIAGNOSIS — R350 Frequency of micturition: Secondary | ICD-10-CM

## 2020-01-19 LAB — POCT URINALYSIS DIPSTICK
Bilirubin, UA: POSITIVE
Blood, UA: NEGATIVE
Glucose, UA: NEGATIVE
Ketones, UA: NEGATIVE
Leukocytes, UA: NEGATIVE
Nitrite, UA: NEGATIVE
Protein, UA: POSITIVE — AB
Spec Grav, UA: >=1.03 — AB (ref 1.010–1.025)
Urobilinogen, UA: 0.2 E.U./dL
pH, UA: 5.5 (ref 5.0–8.0)

## 2020-01-19 MED ORDER — CEPHALEXIN 500 MG PO CAPS: 500.0000 mg | ORAL_CAPSULE | Freq: Four times a day (QID) | ORAL | 0 refills | Status: DC

## 2020-01-19 NOTE — Progress Notes (Signed)
Ordered

## 2020-01-19 NOTE — Telephone Encounter (Signed)
Pt was just seen on 01/11/20. I spoke with pt on 01/12/20 informing him that you wanted him to come drop off a sample and pt declined due to not having any symptoms and feeling better. Do you want pt to come and leave a sample since he is urinating all night or will you send in a prior script?

## 2020-01-19 NOTE — Telephone Encounter (Signed)
He has a history of severe UTI.  We really need a urine culture and urinalysis at a minimum

## 2020-01-19 NOTE — Telephone Encounter (Signed)
Called and spoke with pt and he will come leave a urine sample.

## 2020-01-19 NOTE — Addendum Note (Signed)
Addended by: Christiana Fuchs on: 01/19/2020 08:55 AM   Modules accepted: Orders

## 2020-01-20 LAB — URINE CULTURE
MICRO NUMBER:: 10215034
SPECIMEN QUALITY:: ADEQUATE

## 2020-01-23 ENCOUNTER — Other Ambulatory Visit: Payer: Self-pay | Admitting: Endocrinology

## 2020-01-23 DIAGNOSIS — E0590 Thyrotoxicosis, unspecified without thyrotoxic crisis or storm: Secondary | ICD-10-CM

## 2020-01-24 ENCOUNTER — Ambulatory Visit (HOSPITAL_COMMUNITY)
Admission: RE | Admit: 2020-01-24 | Discharge: 2020-01-24 | Disposition: A | Discharge status: Home / Self Care | Payer: Medicare Other | Source: Ambulatory Visit | Attending: Internal Medicine | Admitting: Internal Medicine

## 2020-01-24 ENCOUNTER — Other Ambulatory Visit: Payer: Self-pay

## 2020-01-24 ENCOUNTER — Other Ambulatory Visit: Payer: Self-pay | Admitting: Interventional Cardiology

## 2020-01-24 DIAGNOSIS — I714 Abdominal aortic aneurysm, without rupture, unspecified: Secondary | ICD-10-CM

## 2020-01-26 ENCOUNTER — Ambulatory Visit: Payer: Medicare Other | Admitting: Endocrinology

## 2020-02-08 ENCOUNTER — Other Ambulatory Visit: Payer: Self-pay

## 2020-02-08 ENCOUNTER — Ambulatory Visit (INDEPENDENT_AMBULATORY_CARE_PROVIDER_SITE_OTHER): Payer: Medicare Other

## 2020-02-08 DIAGNOSIS — E538 Deficiency of other specified B group vitamins: Secondary | ICD-10-CM | POA: Diagnosis not present

## 2020-02-08 MED ORDER — CYANOCOBALAMIN 1000 MCG/ML IJ SOLN
1000.0000 ug | Freq: Once | INTRAMUSCULAR | Status: AC
  Administered 2020-02-08: 1000 ug via INTRAMUSCULAR

## 2020-02-08 NOTE — Progress Notes (Signed)
I have reviewed and agree with note, evaluation, plan.   Raelynne Ludwick, MD  

## 2020-02-08 NOTE — Progress Notes (Signed)
Patient came into the office today to receive his Vitamin B12 injection. Patient tolerated the injection well. No questions or concerns at this time.

## 2020-02-24 ENCOUNTER — Ambulatory Visit (INDEPENDENT_AMBULATORY_CARE_PROVIDER_SITE_OTHER): Payer: Medicare Other | Admitting: Family Medicine

## 2020-02-24 ENCOUNTER — Other Ambulatory Visit: Payer: Self-pay

## 2020-02-24 ENCOUNTER — Encounter: Payer: Self-pay | Admitting: Family Medicine

## 2020-02-24 VITALS — BP 136/78 | HR 73 | Temp 98.6°F | Ht 74.0 in | Wt 195.6 lb

## 2020-02-24 DIAGNOSIS — I714 Abdominal aortic aneurysm, without rupture, unspecified: Secondary | ICD-10-CM

## 2020-02-24 DIAGNOSIS — E785 Hyperlipidemia, unspecified: Secondary | ICD-10-CM

## 2020-02-24 DIAGNOSIS — R809 Proteinuria, unspecified: Secondary | ICD-10-CM

## 2020-02-24 DIAGNOSIS — I10 Essential (primary) hypertension: Secondary | ICD-10-CM | POA: Diagnosis not present

## 2020-02-24 DIAGNOSIS — I25810 Atherosclerosis of coronary artery bypass graft(s) without angina pectoris: Secondary | ICD-10-CM

## 2020-02-24 LAB — MICROALBUMIN / CREATININE URINE RATIO
Creatinine,U: 146.3 mg/dL
Microalb Creat Ratio: 26.4 mg/g (ref 0.0–30.0)
Microalb, Ur: 38.6 mg/dL — ABNORMAL HIGH (ref 0.0–1.9)

## 2020-02-24 LAB — URINALYSIS, MICROSCOPIC ONLY: RBC / HPF: NONE SEEN (ref 0–?)

## 2020-02-24 NOTE — Progress Notes (Signed)
Phone [phone] In person visit   Subjective:   Timothy Gallagher is a 84 y.o. year old very pleasant male patient who presents for/with See problem oriented charting Chief Complaint  Patient presents with  . Follow-up  . Hypertension  . Hyperlipidemia    This visit occurred during the SARS-CoV-2 public health emergency.  Safety protocols were in place, including screening questions prior to the visit, additional usage of staff PPE, and extensive cleaning of exam room while observing appropriate contact time as indicated for disinfecting solutions.   Past Medical History-  Patient Active Problem List   Diagnosis Date Noted  . Hyperthyroidism 01/13/2018    Priority: High  . Detached retina, left 06/30/2017    Priority: High  . AAA (abdominal aortic aneurysm) ([place]) 05/12/2017    Priority: High  . CAD (coronary artery disease) of artery bypass graft 05/19/2014    Priority: High  . BPPV (benign paroxysmal positional vertigo) 09/01/2016    Priority: Medium  . Insomnia 11/22/2014    Priority: Medium  . Anemia 07/20/2014    Priority: Medium  . BPH (benign prostatic hyperplasia) 12/05/2013    Priority: Medium  . Hyperlipidemia 03/13/2008    Priority: Medium  . B12 deficiency 06/07/2007    Priority: Medium  . Essential hypertension 04/09/2007    Priority: Medium  . Chronic obstructive pulmonary disease ([place]) 08/26/2019    Priority: Low  . Paroxysmal atrial fibrillation ([place]) 08/26/2019    Priority: Low  . Left sided abdominal pain 04/15/2017    Priority: Low  . Low back pain 02/06/2017    Priority: Low  . Anal fissure 04/07/2016    Priority: Low  . Internal and external bleeding hemorrhoids 01/22/2015    Priority: Low  . Palpitations 02/11/2013    Priority: Low  . OSTEOARTHRITIS, WRIST, RIGHT 08/05/2010    Priority: Low  . ACNE ROSACEA 06/27/2009    Priority: Low  . ESOPHAGEAL STRICTURE 04/27/2009    Priority: Low  . ACTINIC KERATOSIS, HEAD 04/18/2009   Priority: Low  . NECK PAIN 09/14/2008    Priority: Low  . NEUROPATHY, IDIOPATHIC PERIPHERAL NEC 08/11/2007    Priority: Low  . Irritable bowel syndrome 08/11/2007    Priority: Low  . ALLERGIC RHINITIS 04/09/2007    Priority: Low    Medications- reviewed and updated Current Outpatient Medications  Medication Sig Dispense Refill  . acetaminophen (TYLENOL) 500 MG tablet Take 500 mg by mouth daily as needed (for pain). Reported on 04/11/2016    . aspirin 81 MG EC tablet Take 1 tablet (81 mg total) by mouth daily.    [name] Kitchen atorvastatin (LIPITOR) 20 MG tablet TAKE 1 TABLET(20 MG) BY MOUTH DAILY 90 tablet 3  . methimazole (TAPAZOLE) 5 MG tablet TAKE 1 TABLET(5 MG) BY MOUTH DAILY 30 tablet 3  . Multiple Vitamins-Minerals (CENTRUM SILVER PO) Take 1 tablet by mouth daily.    . tamsulosin (FLOMAX) 0.4 MG CAPS capsule Take 0.4 mg by mouth. Prescribed by urology     No current facility-administered medications for this visit.     Objective:  BP 136/78   Pulse 73   Temp 98.6 F (37 C)   Ht 6\' 2"  (1.88 m)   Wt 195 lb 9.6 oz (88.7 kg)   SpO2 98%   BMI 25.11 kg/m  Gen: NAD, resting comfortably CV: RRR no murmurs rubs or gallops Lungs: CTAB no crackles, wheeze, rhonchi Ext: no edema Skin: warm, dry Neuro: grossly normal, moves all extremities  Assessment and Plan   #hypertension S: currently not on medicine- orthostatic issues in the past BP Readings from Last 3 Encounters:  02/24/20 136/78  01/11/20 (!) 142/76  01/11/20 132/78  A/P: blood pressure with improved control today. Systolic high normal with CAD history but I also get concerned about orthostasis/falls and he is doing better off meds  #hyperlipidemia/CAD S: compliant with Atorvastatin 20Mg  with LDL at goal under 70. Takes aspirin 81mg   In past had issues tolerating statins including pravastatin and atorvastatin- glad hes doing ok now.   For CAD- remains chest pain and shortness of breath free and has close follow up  with Dr. [name] of Cardiology Lab Results  Component Value Date   CHOL 121 04/28/2019   HDL 43.70 04/28/2019   LDLCALC 59 04/28/2019   LDLDIRECT 91.0 05/23/2016   TRIG 90.0 04/28/2019   CHOLHDL 3 04/28/2019   A/P: good control on last check of cholesterol- offered repeat today and we ultimately opted to wait until next visit as recently did cbc and cmp  CAD- appears asymptomatic. He is compliant with aspirin.   # AAA S:05/11/17 Stable 3.3 cm infrarenal abdominal aortic aneurysm. Recommend followup by ultrasound in 3 years  A/P: hopefully stable- update AAA screen at this time   # hyperthyroidism- on methimazole followed by Dr. Loanne Drilling. Did have recent follow up. Next visit in september Lab Results  Component Value Date   TSH 1.719 12/31/2019   # BPH- patient on flomax through urology- needs this due to sepsis with UTI in the past but contributes to orthostasis  #anemic on last check- intermittent mild anemia but not worsening- will trend with next cbc- im ok waiting until next visit  # proteinuria on last labs- we will get urine microscopic and microalb/cr ratio to further evaluate. dehydration may have been the cause on last check.   Recommended follow up: Return in about 6 months (around 08/25/2020) for physical or sooner if needed. Future Appointments  Date Time Provider [place]  03/13/2020 10:30 AM [facility] NURSE [facility]  07/25/2020 10:45 AM Renato Shin, MD [facility] None    Lab/Order associations:   ICD-10-CM   1. Coronary artery disease involving coronary bypass graft of native heart without angina pectoris  I25.810   2. Abdominal aortic aneurysm (AAA) without rupture ([place])  I71.4 VAS [place] AAA DUPLEX  3. Hyperlipidemia, unspecified hyperlipidemia type  E78.5   4. Essential hypertension  I10   5. Proteinuria, unspecified type  R80.9 Microalbumin / creatinine urine ratio    Urine Microscopic   Return precautions advised.  [name], MD

## 2020-02-24 NOTE — Assessment & Plan Note (Signed)
S:05/11/17 Stable 3.3 cm infrarenal abdominal aortic aneurysm. Recommend followup by ultrasound in 3 years  A/P: hopefully stable- update AAA screen at this time

## 2020-02-24 NOTE — Patient Instructions (Addendum)
We will call you within two weeks about your referral for aneurysm follow up. If you do not hear within 3 weeks, give [place] a call.   We opted to skip labs this time since just did in February- we will do full labs next visit including cholesterol.   Please stop by lab before you go for just a urine If you do not have mychart- we will call you about results within 5 business days of [place] receiving them.  If you have mychart- we will send your results within 3 business days of [place] receiving them.  If abnormal or we want to clarify a result, we will call or mychart you to make sure you receive the message.  If you have questions or concerns or don't hear within 5 business days, please send [place] a message or call us.     Recommended follow up: Return in about 6 months (around 08/25/2020) for physical or sooner if needed.

## 2020-03-02 ENCOUNTER — Telehealth: Payer: Self-pay

## 2020-03-02 MED ORDER — TIZANIDINE HCL 2 MG PO TABS: 1.0000 mg | ORAL_TABLET | Freq: Three times a day (TID) | ORAL | 0 refills | Status: DC | PRN

## 2020-03-02 NOTE — Telephone Encounter (Signed)
Called and spoke with pt and he states he pulled a muscle in his shoulder 2 weeks ago and its not getting better, he would like for a muscle relaxer to be called in. I advise pt that muscle relaxers can cause falls at times so to make sure he has a cane or walker with him while taking the muscle relaxer.

## 2020-03-02 NOTE — Telephone Encounter (Signed)
Patient states that he pulled a muscle and would like for Dr.Hunter to call some medication in for me. Offered patient 03/06/20 and he declined the appt. Patient would like for a nurse to return his call

## 2020-03-02 NOTE — Addendum Note (Signed)
Addended by: [name] on: 03/02/2020 02:40 PM   Modules accepted: Orders

## 2020-03-02 NOTE — Telephone Encounter (Signed)
I sent in a muscle relaxant for him to try

## 2020-03-12 ENCOUNTER — Encounter: Payer: Self-pay | Admitting: Family Medicine

## 2020-03-12 ENCOUNTER — Telehealth: Payer: Self-pay | Admitting: Family Medicine

## 2020-03-12 ENCOUNTER — Ambulatory Visit (INDEPENDENT_AMBULATORY_CARE_PROVIDER_SITE_OTHER): Payer: Medicare Other | Admitting: Family Medicine

## 2020-03-12 ENCOUNTER — Other Ambulatory Visit: Payer: Self-pay

## 2020-03-12 VITALS — BP 138/70 | HR 97 | Temp 98.6°F | Ht 74.0 in | Wt 196.8 lb

## 2020-03-12 DIAGNOSIS — I10 Essential (primary) hypertension: Secondary | ICD-10-CM

## 2020-03-12 DIAGNOSIS — M25511 Pain in right shoulder: Secondary | ICD-10-CM

## 2020-03-12 DIAGNOSIS — E538 Deficiency of other specified B group vitamins: Secondary | ICD-10-CM | POA: Diagnosis not present

## 2020-03-12 MED ORDER — CYANOCOBALAMIN 1000 MCG/ML IJ SOLN
1000.0000 ug | Freq: Once | INTRAMUSCULAR | Status: AC
  Administered 2020-03-12: 1000 ug via INTRAMUSCULAR

## 2020-03-12 NOTE — Patient Instructions (Addendum)
84 year old male with right shoulder blade pain that overall is improving-he still has some pain in the pinpoint area.  I recommended he use heat 15 to 20 minutes 3 times a day for the next week.  If he continues to improve he may monitor at home.  If his symptoms fail to improve or worsen I would like for him to see our sports medicine doctors.  If he has new or worsening symptoms he should let us know  Recommended follow up: Return for next already scheduled visit OR  as needed for new, worsening, persistent symptoms.

## 2020-03-12 NOTE — Telephone Encounter (Signed)
Ok to make app ° °

## 2020-03-12 NOTE — Progress Notes (Signed)
Phone [phone] In person visit   Subjective:   Timothy Gallagher is a 84 y.o. year old very pleasant male patient who presents for/with See problem oriented charting Chief Complaint  Patient presents with  . Shoulder Pain    right    This visit occurred during the SARS-CoV-2 public health emergency.  Safety protocols were in place, including screening questions prior to the visit, additional usage of staff PPE, and extensive cleaning of exam room while observing appropriate contact time as indicated for disinfecting solutions.   Past Medical History-  Patient Active Problem List   Diagnosis Date Noted  . Hyperthyroidism 01/13/2018    Priority: High  . Detached retina, left 06/30/2017    Priority: High  . AAA (abdominal aortic aneurysm) ([place]) 05/12/2017    Priority: High  . CAD (coronary artery disease) of artery bypass graft 05/19/2014    Priority: High  . BPPV (benign paroxysmal positional vertigo) 09/01/2016    Priority: Medium  . Insomnia 11/22/2014    Priority: Medium  . Anemia 07/20/2014    Priority: Medium  . BPH (benign prostatic hyperplasia) 12/05/2013    Priority: Medium  . Hyperlipidemia 03/13/2008    Priority: Medium  . B12 deficiency 06/07/2007    Priority: Medium  . Essential hypertension 04/09/2007    Priority: Medium  . Chronic obstructive pulmonary disease ([place]) 08/26/2019    Priority: Low  . Paroxysmal atrial fibrillation ([place]) 08/26/2019    Priority: Low  . Left sided abdominal pain 04/15/2017    Priority: Low  . Low back pain 02/06/2017    Priority: Low  . Anal fissure 04/07/2016    Priority: Low  . Internal and external bleeding hemorrhoids 01/22/2015    Priority: Low  . Palpitations 02/11/2013    Priority: Low  . OSTEOARTHRITIS, WRIST, RIGHT 08/05/2010    Priority: Low  . ACNE ROSACEA 06/27/2009    Priority: Low  . ESOPHAGEAL STRICTURE 04/27/2009    Priority: Low  . ACTINIC KERATOSIS, HEAD 04/18/2009    Priority: Low  . NECK PAIN  09/14/2008    Priority: Low  . NEUROPATHY, IDIOPATHIC PERIPHERAL NEC 08/11/2007    Priority: Low  . Irritable bowel syndrome 08/11/2007    Priority: Low  . ALLERGIC RHINITIS 04/09/2007    Priority: Low    Medications- reviewed and updated Current Outpatient Medications  Medication Sig Dispense Refill  . acetaminophen (TYLENOL) 500 MG tablet Take 500 mg by mouth daily as needed (for pain). Reported on 04/11/2016    . atorvastatin (LIPITOR) 20 MG tablet TAKE 1 TABLET(20 MG) BY MOUTH DAILY 90 tablet 3  . methimazole (TAPAZOLE) 5 MG tablet TAKE 1 TABLET(5 MG) BY MOUTH DAILY 30 tablet 3  . Multiple Vitamins-Minerals (CENTRUM SILVER PO) Take 1 tablet by mouth daily.    . tamsulosin (FLOMAX) 0.4 MG CAPS capsule Take 0.4 mg by mouth. Prescribed by urology    . tiZANidine (ZANAFLEX) 2 MG tablet Take 0.5-1 tablets (1-2 mg total) by mouth every 8 (eight) hours as needed for muscle spasms. 12 tablet 0  . aspirin 81 MG EC tablet Take 1 tablet (81 mg total) by mouth daily. (Patient not taking: Reported on 03/12/2020)     No current facility-administered medications for this visit.     Objective:  BP 138/70   Pulse 97   Temp 98.6 F (37 C) (Temporal)   Ht 6\' 2"  (1.88 m)   Wt 196 lb 12.8 oz (89.3 kg)   SpO2 97%   BMI 25.27  kg/m  Gen: NAD, resting comfortably CV: RRR no murmurs rubs or gallops Lungs: CTAB no crackles, wheeze, rhonchi Ext: no edema Skin: warm, dry MSK: Good range of motion of the shoulders without significant worsening of pain.  Patient does have some pain with palpation of the shoulder blade over lateral aspect that is moderately tender.    Assessment and Plan   # Shoulder blade pain - right S: Started about two weeks ago. Right side. Shoulder into back. Has taken some Tizanidine that helps some. Right now pain is a 5/10. Pain has gotten to 10/10. Pain described as continuous hurting. Has tried resting and no much help. Denies any injury or fall- was doing some lifting for  his wife which could have triggered it. No pleuritic chest pain A/P: 84 year old male with right shoulder blade pain that overall is improving-he still has some pain in the pinpoint area.  I recommended he use heat 15 to 20 minutes 3 times a day for the next week.  If he continues to improve he may monitor at home.  If his symptoms fail to improve or worsen I would like for him to see our sports medicine doctors.  If he has new or worsening symptoms he should let us know. I think a trigger point injection might be helpful- is really tense in one area. Can use tizanidine for muscle spasm - he has not felt overly fatigued or off balance on this   #hypertension S: medication: none at this time Home readings #s: reports #s even better than we had in office today BP Readings from Last 3 Encounters:  03/12/20 138/70  02/24/20 136/78  01/11/20 (!) 142/76  A/P: Stable. Continue without medications.   Recommended follow up: Return for next already scheduled visit OR as needed for new, worsening, persistent symptoms. Future Appointments  Date Time Provider [place]  07/25/2020 10:45 AM Renato Shin, MD [facility] None  08/29/2020 10:40 AM Yong Channel Brayton Mars, MD [facility]    Lab/Order associations:   ICD-10-CM   1. Acute pain of right shoulder  M25.511   2. Essential hypertension  I10   3. B12 deficiency  E53.8 cyanocobalamin ((VITAMIN B-12)) injection 1,000 mcg    Meds ordered this encounter  Medications  . cyanocobalamin ((VITAMIN B-12)) injection 1,000 mcg   Return precautions advised.  [name], MD

## 2020-03-12 NOTE — Telephone Encounter (Signed)
Scheduled pt appt at 3 today with [name]

## 2020-03-12 NOTE — Telephone Encounter (Signed)
Patient called in saying he has been having some R shoulder pain for the last two weeks, hasnt not gotten any better and offered an appointment with another provider for tomorrow but states he wanted to see Dr.Hunter. Can I schedule patient in same day slot for Wednesday at 10 am?

## 2020-03-13 ENCOUNTER — Ambulatory Visit: Payer: Medicare Other

## 2020-03-26 ENCOUNTER — Telehealth: Payer: Self-pay

## 2020-03-26 NOTE — Telephone Encounter (Signed)
  LAST APPOINTMENT DATE: 03/12/2020   NEXT APPOINTMENT DATE:@6 /02/2020  MEDICATION:tamsulosin (FLOMAX) 0.4 MG CAPS capsule  PHARMACY: [facility] [id number], [address] AT [place] Phone:  [phone]  Fax:  [phone]       **Let patient know to contact pharmacy at the end of the day to make sure medication is ready. **  ** Please notify patient to allow 48-72 hours to process**  **Encourage patient to contact the pharmacy for refills or they can request refills through [facility]  CLINICAL FILLS OUT ALL BELOW:   LAST REFILL:  QTY:  REFILL DATE:    OTHER COMMENTS:    Okay for refill?  Please advise

## 2020-03-26 NOTE — Addendum Note (Signed)
Addended by: Thomes Cake on: 03/26/2020 03:48 PM   Modules accepted: Orders

## 2020-04-11 NOTE — Progress Notes (Signed)
Phone: [phone]   Subjective:  Patient presents today for their annual physical. Chief complaint-noted.   See problem oriented charting- ROS- full  review of systems was completed and negative  except for: occasional dizziness, right upper back and chest pain improved, nocturia stable  The following were reviewed and entered/updated in epic: Past Medical History:  Diagnosis Date  . ACNE ROSACEA 06/27/2009  . ACTINIC KERATOSIS, HEAD 04/18/2009  . Acute maxillary sinusitis 05/14/2010  . ALLERGIC RHINITIS 04/09/2007  . Anal fissure 04/07/2016  . B12 DEFICIENCY 06/07/2007  . BACK PAIN WITH RADICULOPATHY 04/24/2008  . Cancer ([place])    skin  . CHEST WALL PAIN, ACUTE 06/15/2009  . Chronic maxillary sinusitis 05/29/2008  . COLITIS 04/27/2009  . COLONIC POLYPS, HX OF 04/27/2009   tubular adenomas  . Coronary artery disease 05/12/2014   Cath 05/12/2014 w/ severe 3-vessel CAD and preserved LV function, EF 55%  . DERMATITIS, ATOPIC 10/12/2007  . DIVERTICULOSIS, COLON 04/27/2009  . ECCHYMOSES, SPONTANEOUS 06/27/2008  . Elevated sedimentation rate 05/02/2009  . ESOPHAGEAL STRICTURE 04/27/2009  . GASTRITIS, CHRONIC 04/27/2009  . HYPERLIPIDEMIA 03/13/2008  . HYPERTENSION 04/09/2007  . Internal bleeding hemorrhoids 01/22/2015   01/22/2015 Seen at anoscopy, grade 1 all 3 positions   . Irritable bowel syndrome 08/11/2007  . KIDNEY DISEASE 04/09/2007  . NECK PAIN 09/14/2008  . NEUROPATHY, IDIOPATHIC PERIPHERAL NEC 08/11/2007  . OSTEOARTHRITIS, WRIST, RIGHT 08/05/2010  . Postoperative delirium 05/20/2014  . S/P CABG x 4 05/19/2014   LIMA to LAD, SVG to diag, SVG to OM, SVG to PDA, EVH via right thigh and leg   Patient Active Problem List   Diagnosis Date Noted  . Paroxysmal atrial fibrillation ([place]) 08/26/2019    Priority: High  . Hyperthyroidism 01/13/2018    Priority: High  . Detached retina, left 06/30/2017    Priority: High  . AAA (abdominal aortic aneurysm) ([place]) 05/12/2017    Priority: High  . CAD  (coronary artery disease) of artery bypass graft 05/19/2014    Priority: High  . BPPV (benign paroxysmal positional vertigo) 09/01/2016    Priority: Medium  . Insomnia 11/22/2014    Priority: Medium  . Anemia 07/20/2014    Priority: Medium  . BPH (benign prostatic hyperplasia) 12/05/2013    Priority: Medium  . Hyperlipidemia 03/13/2008    Priority: Medium  . B12 deficiency 06/07/2007    Priority: Medium  . Essential hypertension 04/09/2007    Priority: Medium  . Chronic obstructive pulmonary disease ([place]) 08/26/2019    Priority: Low  . Left sided abdominal pain 04/15/2017    Priority: Low  . Low back pain 02/06/2017    Priority: Low  . Anal fissure 04/07/2016    Priority: Low  . Internal and external bleeding hemorrhoids 01/22/2015    Priority: Low  . Palpitations 02/11/2013    Priority: Low  . OSTEOARTHRITIS, WRIST, RIGHT 08/05/2010    Priority: Low  . ACNE ROSACEA 06/27/2009    Priority: Low  . ESOPHAGEAL STRICTURE 04/27/2009    Priority: Low  . ACTINIC KERATOSIS, HEAD 04/18/2009    Priority: Low  . NECK PAIN 09/14/2008    Priority: Low  . NEUROPATHY, IDIOPATHIC PERIPHERAL NEC 08/11/2007    Priority: Low  . Irritable bowel syndrome 08/11/2007    Priority: Low  . ALLERGIC RHINITIS 04/09/2007    Priority: Low   Past Surgical History:  Procedure Laterality Date  . CARDIAC CATHETERIZATION    . COLONOSCOPY    . CORONARY ARTERY BYPASS GRAFT N/A 05/19/2014  Procedure: CORONARY ARTERY BYPASS GRAFTING (CABG);  Surgeon: Rexene Alberts, MD;  Location: [facility];  Service: Open Heart Surgery;  Laterality: N/A;  Times 4 using left internal mammary artery and endoscopically harvested right saphenous vein  . ESOPHAGOGASTRODUODENOSCOPY    . FINGER SURGERY     cut off end of finger  . FLEXIBLE SIGMOIDOSCOPY    . HEMORRHOID BANDING    . HERNIA REPAIR    . INCISION AND DRAINAGE WOUND WITH FOREIGN BODY REMOVAL Left 12/20/2013   Procedure: INCISION AND DRAINAGE LEFT INDEX FINGER;   Surgeon: Tennis Must, MD;  Location: [facility];  Service: Orthopedics;  Laterality: Left;  . INTRAOPERATIVE TRANSESOPHAGEAL ECHOCARDIOGRAM N/A 05/19/2014   Procedure: INTRAOPERATIVE TRANSESOPHAGEAL ECHOCARDIOGRAM;  Surgeon: Rexene Alberts, MD;  Location: [facility];  Service: Open Heart Surgery;  Laterality: N/A;  . lamenectomy    . LEFT HEART CATHETERIZATION WITH CORONARY ANGIOGRAM N/A 05/12/2014   Procedure: LEFT HEART CATHETERIZATION WITH CORONARY ANGIOGRAM;  Surgeon: Sinclair Grooms, MD;  Location: [facility] CATH [facility];  Service: Cardiovascular;  Laterality: N/A;  . LUMBAR FUSION    . TONSILLECTOMY    . VARICOSE VEIN SURGERY Left     Family History  Problem Relation Age of Onset  . Hernia Mother   . Heart disease Father        smoker  . COPD Father   . Heart attack Brother   . Early death Daughter   . Colon cancer Neg Hx     Medications- reviewed and updated Current Outpatient Medications  Medication Sig Dispense Refill  . acetaminophen (TYLENOL) 500 MG tablet Take 500 mg by mouth daily as needed (for pain). Reported on 04/11/2016    . aspirin 81 MG EC tablet Take 1 tablet (81 mg total) by mouth daily.    [name] Kitchen atorvastatin (LIPITOR) 20 MG tablet TAKE 1 TABLET(20 MG) BY MOUTH DAILY 90 tablet 3  . methimazole (TAPAZOLE) 5 MG tablet TAKE 1 TABLET(5 MG) BY MOUTH DAILY 30 tablet 3  . Multiple Vitamins-Minerals (CENTRUM SILVER PO) Take 1 tablet by mouth daily.    . tamsulosin (FLOMAX) 0.4 MG CAPS capsule Take 0.4 mg by mouth. Prescribed by urology     No current facility-administered medications for this visit.    Allergies-reviewed and updated Allergies  Allergen Reactions  . Lipitor [Atorvastatin] Other (See Comments)    REACTION: nausea and blurred vision  . Trazodone And Nefazodone Other (See Comments)    dizzy  . Ciprofloxacin Swelling  . Mycophenolate Mofetil Other (See Comments)    REACTION: unspecified  . Amoxicillin Rash    Has patient had a PCN reaction causing immediate rash,  facial/tongue/throat swelling, SOB or lightheadedness with hypotension: Unknown Has patient had a PCN reaction causing severe rash involving mucus membranes or skin necrosis: Unknown Has patient had a PCN reaction that required hospitalization: Unknown Has patient had a PCN reaction occurring within the last 10 years: Unknown If all of the above answers are "NO", then may proceed with Cephalosporin use.   [name] Kitchen Penicillins Rash    Has patient had a PCN reaction causing immediate rash, facial/tongue/throat swelling, SOB or lightheadedness with hypotension: Unknown Has patient had a PCN reaction causing severe rash involving mucus membranes or skin necrosis: Unknown Has patient had a PCN reaction that required hospitalization: Unknown Has patient had a PCN reaction occurring within the last 10 years: Unknown If all of the above answers are "NO", then may proceed with Cephalosporin use.   . Rosuvastatin  Other (See Comments)    Unknown     Social History   Social History Narrative   Married 35 years (2nd marriage). 2 kids from previous marriage (lost a 12rd child hit by car at age 4). 2 stepchildren. 6 grandkids. 2 greatgrandkids.       Retired from [employer]: walking/exercise, building in shop-furniture (cut finger off in February doing this)   6 grand children - Has bought them all a new car    Brother died of probably addiction   Sister also died; not sure of cause    He enjoys his life; Likes to play the keyboard; guitar    Had played in a gospel quartet    Enjoys working with wood    Objective  Objective:  BP 120/72   Pulse 71   Temp 98.2 F (36.8 C)   Ht 6\' 2"  (1.88 m)   Wt 194 lb 12.8 oz (88.4 kg)   SpO2 96%   BMI 25.01 kg/m  Gen: NAD, resting comfortably HEENT: Mucous membranes are moist. Oropharynx normal Neck: no thyromegaly CV: RRR no murmurs rubs or gallops Lungs: CTAB no crackles, wheeze, rhonchi Abdomen:  soft/nontender/nondistended/normal bowel sounds. No rebound or guarding.  Ext: no edema Skin: warm, dry Neuro: grossly normal, moves all extremities, PERRLA- left eye slightly red per baseline since detached retina    Assessment and Plan  84 y.o. male presenting for annual physical.  Health Maintenance counseling: 1. Anticipatory guidance: Patient counseled regarding regular dental exams - dentures- has dentist if needed, eye exams - yearly- history of detached retina on the left unfortunately,  avoiding smoking and second hand smoke , limiting alcohol to 2 beverages per day - doesn't drink.   2. Risk factor reduction:  Advised patient of need for regular exercise and diet rich and fruits and vegetables to reduce risk of heart attack and stroke. Exercise- active at home- encouraged walking- hed prefer activities like building and mowing. Diet- reasonably healthy- BMI right at 25. Recommended avoiding more weight gain Wt Readings from Last 3 Encounters:  04/20/20 194 lb 12.8 oz (88.4 kg)  03/12/20 196 lb 12.8 oz (89.3 kg)  02/24/20 195 lb 9.6 oz (88.7 kg)  3. Immunizations/screenings/ancillary studies- up to date Immunization History  Administered Date(s) Administered  . Fluad Quad(high Dose 65+) 07/28/2019  . Influenza Split 08/11/2011, 08/05/2012  . Influenza Whole 08/31/2007, 08/15/2008, 08/20/2009, 08/05/2010  . Influenza, High Dose Seasonal PF 07/28/2016, 09/10/2017, 08/13/2018  . Influenza,inj,Quad PF,6+ Mos 07/15/2013, 07/20/2014, 08/10/2015  . Influenza,inj,quad, With Preservative 08/17/2018  . PFIZER SARS-COV-2 Vaccination 11/28/2019, 12/22/2019  . Pneumococcal Conjugate-13 11/04/2013  . Pneumococcal Polysaccharide-23 02/05/2015  . Td 03/18/2007  . Tdap 06/04/2018  . Zoster 04/16/2011  . Zoster Recombinat (Shingrix) 08/15/2019, 10/31/2019   4. Prostate cancer screening-  Past age based screening recommendations  Lab Results  Component Value Date   PSA 3.18 09/05/2013   PSA  3.52 08/29/2013   PSA 3.16 02/12/2012   5. Colon cancer screening - normal colonoscopy 11/21/2009- no repeat due to age 44. Skin cancer screening- sees as needed. advised regular sunscreen use. Denies worrisome, changing, or new skin lesions.  7. Former smoker- quit in 200 8. STD screening - opts out  Status of chronic or acute concerns   B12 deficiency - Plan: cyanocobalamin ((VITAMIN B-12)) injection 1,000 mcg. Once a month- update b12 with labs. Consider every 5 weeks if #s high - hed like to space out if  he can.  Lab Results  Component Value Date   VITAMINB12 140 (L) 04/28/2019   #Coronary artery disease involving coronary bypass graft of native heart without angina pectoris # HLD -no chest pain or shortness of breath -compliant with aspirin and atorvastatin -cad appears stable- continue current meds -almost due for full lipid panel will update at this time- for HLD- continue current meds likely Lab Results  Component Value Date   CHOL 121 04/28/2019   HDL 43.70 04/28/2019   LDLCALC 59 04/28/2019   LDLDIRECT 91.0 05/23/2016   TRIG 90.0 04/28/2019   CHOLHDL 3 04/28/2019   Essential hypertension- stable off medication recently- had been removed due to orthostatic hypotension  Benign prostatic hyperplasia with nocturia- reasonable control on tamsulosin  Hyperthyroidism- close follow up with Dr. Loanne Drilling. On methimazole  Proteinuria noted on prior urines but urine micro normal. microalb/cr ratio <30 on 02/25/20  History of a fib- no obvious recurrence from 2015- cardiology has been ok with no NOAC- aspirin alone with no recurrence since postop a fib.   Recommended follow up: keep October visit- likely due AWV Future Appointments  Date Time Provider [place]  07/25/2020 10:45 AM Renato Shin, MD [facility] None  08/29/2020 10:40 AM [name], MD [facility]   Lab/Order associations: not fasting- donuts for breakfast and toast   ICD-10-CM   1. Preventative  health care  Z00.00   2. B12 deficiency  E53.8 cyanocobalamin ((VITAMIN B-12)) injection 1,000 mcg  3. Coronary artery disease involving coronary bypass graft of native heart without angina pectoris  I25.810   4. Abdominal aortic aneurysm (AAA) without rupture (HCC)  I71.4   5. Essential hypertension  I10   6. Hyperlipidemia, unspecified hyperlipidemia type  E78.5   7. Benign prostatic hyperplasia with nocturia  N40.1    R35.1   8. Hyperthyroidism  E05.90     Meds ordered this encounter  Medications  . cyanocobalamin ((VITAMIN B-12)) injection 1,000 mcg    Return precautions advised.  [name], MD

## 2020-04-20 ENCOUNTER — Other Ambulatory Visit: Payer: Self-pay

## 2020-04-20 ENCOUNTER — Encounter: Payer: Self-pay | Admitting: Family Medicine

## 2020-04-20 ENCOUNTER — Ambulatory Visit (INDEPENDENT_AMBULATORY_CARE_PROVIDER_SITE_OTHER): Payer: Medicare Other | Admitting: Family Medicine

## 2020-04-20 VITALS — BP 120/72 | HR 71 | Temp 98.2°F | Ht 74.0 in | Wt 194.8 lb

## 2020-04-20 DIAGNOSIS — Z0000 Encounter for general adult medical examination without abnormal findings: Secondary | ICD-10-CM | POA: Diagnosis not present

## 2020-04-20 DIAGNOSIS — I10 Essential (primary) hypertension: Secondary | ICD-10-CM | POA: Diagnosis not present

## 2020-04-20 DIAGNOSIS — I714 Abdominal aortic aneurysm, without rupture, unspecified: Secondary | ICD-10-CM

## 2020-04-20 DIAGNOSIS — N401 Enlarged prostate with lower urinary tract symptoms: Secondary | ICD-10-CM

## 2020-04-20 DIAGNOSIS — E785 Hyperlipidemia, unspecified: Secondary | ICD-10-CM

## 2020-04-20 DIAGNOSIS — E538 Deficiency of other specified B group vitamins: Secondary | ICD-10-CM | POA: Diagnosis not present

## 2020-04-20 DIAGNOSIS — I25810 Atherosclerosis of coronary artery bypass graft(s) without angina pectoris: Secondary | ICD-10-CM | POA: Diagnosis not present

## 2020-04-20 DIAGNOSIS — E0590 Thyrotoxicosis, unspecified without thyrotoxic crisis or storm: Secondary | ICD-10-CM

## 2020-04-20 DIAGNOSIS — R351 Nocturia: Secondary | ICD-10-CM

## 2020-04-20 LAB — COMPREHENSIVE METABOLIC PANEL
ALT: 10 U/L (ref 0–53)
AST: 15 U/L (ref 0–37)
Albumin: 3.9 g/dL (ref 3.5–5.2)
Alkaline Phosphatase: 89 U/L (ref 39–117)
BUN: 17 mg/dL (ref 6–23)
CO2: 28 mEq/L (ref 19–32)
Calcium: 9.1 mg/dL (ref 8.4–10.5)
Chloride: 106 mEq/L (ref 96–112)
Creatinine, Ser: 1 mg/dL (ref 0.40–1.50)
GFR: 71.57 mL/min (ref >60.00)
Glucose, Bld: 97 mg/dL (ref 70–99)
Potassium: 4.3 mEq/L (ref 3.5–5.1)
Sodium: 141 mEq/L (ref 135–145)
Total Bilirubin: 0.4 mg/dL (ref 0.2–1.2)
Total Protein: 6.5 g/dL (ref 6.0–8.3)

## 2020-04-20 LAB — CBC WITH DIFFERENTIAL/PLATELET
Basophils Absolute: 0 10*3/uL (ref 0.0–0.1)
Basophils Relative: 0.6 % (ref 0.0–3.0)
Eosinophils Absolute: 0.2 10*3/uL (ref 0.0–0.7)
Eosinophils Relative: 3.8 % (ref 0.0–5.0)
HCT: 38.9 % — ABNORMAL LOW (ref 39.0–52.0)
Hemoglobin: 12.6 g/dL — ABNORMAL LOW (ref 13.0–17.0)
Lymphocytes Relative: 34.3 % (ref 12.0–46.0)
Lymphs Abs: 1.7 10*3/uL (ref 0.7–4.0)
MCHC: 32.4 g/dL (ref 30.0–36.0)
MCV: 84.9 fl (ref 78.0–100.0)
Monocytes Absolute: 0.5 10*3/uL (ref 0.1–1.0)
Monocytes Relative: 9 % (ref 3.0–12.0)
Neutro Abs: 2.6 10*3/uL (ref 1.4–7.7)
Neutrophils Relative %: 52.3 % (ref 43.0–77.0)
Platelets: 162 10*3/uL (ref 150.0–400.0)
RBC: 4.58 Mil/uL (ref 4.22–5.81)
RDW: 16.1 % — ABNORMAL HIGH (ref 11.5–15.5)
WBC: 5 10*3/uL (ref 4.0–10.5)

## 2020-04-20 LAB — LIPID PANEL
Cholesterol: 125 mg/dL (ref 0–200)
HDL: 42.2 mg/dL (ref >39.00)
LDL Cholesterol: 61 mg/dL (ref 0–99)
NonHDL: 82.62
Total CHOL/HDL Ratio: 3
Triglycerides: 109 mg/dL (ref 0.0–149.0)
VLDL: 21.8 mg/dL (ref 0.0–40.0)

## 2020-04-20 LAB — VITAMIN B12: Vitamin B-12: >1526 pg/mL — ABNORMAL HIGH (ref 211–911)

## 2020-04-20 MED ORDER — CYANOCOBALAMIN 1000 MCG/ML IJ SOLN
1000.0000 ug | Freq: Once | INTRAMUSCULAR | Status: AC
  Administered 2020-04-20: 1000 ug via INTRAMUSCULAR

## 2020-04-20 NOTE — Assessment & Plan Note (Signed)
05/11/17 Stable 3.3 cm infrarenal abdominal aortic aneurysm. Recommend followup by ultrasound in 3 years. Dr. [name] recommended repeat 01/24/20 and this was unchanged- 3 year repeat would be reasonable

## 2020-04-20 NOTE — Patient Instructions (Addendum)
Please stop by lab before you go If you have mychart- we will send your results within 3 business days of [place] receiving them.  If you do not have mychart- we will call you about results within 5 business days of [place] receiving them.    Glad you are doing well!  We ended up going ahead and doing your physical today-we will try to do your annual wellness visit at the next visit  Make sure to behavior so between now and then!  And get your monthly B12 shots-if B12 looks really good may try to space it out every 5 weeks  Just for you- I am willing to see your neighbor but she has to make sure she says your name and that you and I already talked about me seeing her.

## 2020-04-23 ENCOUNTER — Other Ambulatory Visit: Payer: Self-pay

## 2020-04-23 DIAGNOSIS — D649 Anemia, unspecified: Secondary | ICD-10-CM

## 2020-04-27 ENCOUNTER — Other Ambulatory Visit (INDEPENDENT_AMBULATORY_CARE_PROVIDER_SITE_OTHER): Payer: Medicare Other

## 2020-04-27 DIAGNOSIS — D649 Anemia, unspecified: Secondary | ICD-10-CM

## 2020-04-27 LAB — FECAL OCCULT BLOOD, IMMUNOCHEMICAL: Fecal Occult Bld: NEGATIVE

## 2020-05-03 ENCOUNTER — Ambulatory Visit: Payer: Medicare Other | Admitting: Podiatry

## 2020-05-16 ENCOUNTER — Other Ambulatory Visit: Payer: Self-pay | Admitting: Endocrinology

## 2020-05-16 DIAGNOSIS — E0590 Thyrotoxicosis, unspecified without thyrotoxic crisis or storm: Secondary | ICD-10-CM

## 2020-05-23 NOTE — Progress Notes (Deleted)
Phone [phone] In person visit   Subjective:   Timothy Gallagher is a 84 y.o. year old very pleasant male patient who presents for/with See problem oriented charting No chief complaint on file.   This visit occurred during the SARS-CoV-2 public health emergency.  Safety protocols were in place, including screening questions prior to the visit, additional usage of staff PPE, and extensive cleaning of exam room while observing appropriate contact time as indicated for disinfecting solutions.   Past Medical History-  Patient Active Problem List   Diagnosis Date Noted  . Chronic obstructive pulmonary disease ([place]) 08/26/2019  . History of atrial fibrillation 08/26/2019  . Hyperthyroidism 01/13/2018  . Detached retina, left 06/30/2017  . AAA (abdominal aortic aneurysm) ([place]) 05/12/2017  . Left sided abdominal pain 04/15/2017  . Low back pain 02/06/2017  . BPPV (benign paroxysmal positional vertigo) 09/01/2016  . Anal fissure 04/07/2016  . Internal and external bleeding hemorrhoids 01/22/2015  . Insomnia 11/22/2014  . Anemia 07/20/2014  . CAD (coronary artery disease) of artery bypass graft 05/19/2014  . BPH (benign prostatic hyperplasia) 12/05/2013  . Palpitations 02/11/2013  . OSTEOARTHRITIS, WRIST, RIGHT 08/05/2010  . ACNE ROSACEA 06/27/2009  . ESOPHAGEAL STRICTURE 04/27/2009  . ACTINIC KERATOSIS, HEAD 04/18/2009  . NECK PAIN 09/14/2008  . Hyperlipidemia 03/13/2008  . NEUROPATHY, IDIOPATHIC PERIPHERAL NEC 08/11/2007  . Irritable bowel syndrome 08/11/2007  . B12 deficiency 06/07/2007  . Essential hypertension 04/09/2007  . ALLERGIC RHINITIS 04/09/2007    Medications- reviewed and updated Current Outpatient Medications  Medication Sig Dispense Refill  . acetaminophen (TYLENOL) 500 MG tablet Take 500 mg by mouth daily as needed (for pain). Reported on 04/11/2016    . aspirin 81 MG EC tablet Take 1 tablet (81 mg total) by mouth daily.    [name] Kitchen atorvastatin (LIPITOR) 20 MG  tablet TAKE 1 TABLET(20 MG) BY MOUTH DAILY 90 tablet 3  . methimazole (TAPAZOLE) 5 MG tablet TAKE 1 TABLET(5 MG) BY MOUTH DAILY 30 tablet 3  . Multiple Vitamins-Minerals (CENTRUM SILVER PO) Take 1 tablet by mouth daily.    . tamsulosin (FLOMAX) 0.4 MG CAPS capsule Take 0.4 mg by mouth. Prescribed by urology     No current facility-administered medications for this visit.     Objective:  There were no vitals taken for this visit. Gen: NAD, resting comfortably CV: RRR no murmurs rubs or gallops Lungs: CTAB no crackles, wheeze, rhonchi Abdomen: soft/nontender/nondistended/normal bowel sounds. No rebound or guarding.  Ext: no edema Skin: warm, dry Neuro: grossly normal, moves all extremities  ***    Assessment and Plan  *** ***08/12/2019 awv ***04/20/20 cpe  ***october 2021 visit should be AWV likely  ***Proteinuria. urine micro normal. microalb/cr ratio <30 on 02/25/20  ***Your B12 level was normal-actually on the high end-I am okay with spacing B12 checks to every 5 weeks and rechecking at next visit 04/21/20  No problem-specific Assessment & Plan notes found for this encounter.   Recommended follow up: ***No follow-ups on file. Future Appointments  Date Time Provider [place]  05/24/2020 10:00 AM [name], MD [facility] [place]  07/25/2020 10:45 AM Renato Shin, MD [facility] None  08/29/2020 10:40 AM [name], MD [facility]    Lab/Order associations: No diagnosis found.  No orders of the defined types were placed in this encounter.   Time Spent: *** minutes of total time (11:41 AM***- 11:41 AM***) was spent on the date of the encounter performing the following actions: chart review prior to seeing  the patient, obtaining history, performing a medically necessary exam, counseling on the treatment plan, placing orders, and documenting in our EHR.   Return precautions advised.  Clyde Lundborg, CMA

## 2020-05-24 ENCOUNTER — Ambulatory Visit (INDEPENDENT_AMBULATORY_CARE_PROVIDER_SITE_OTHER): Payer: Medicare Other

## 2020-05-24 ENCOUNTER — Ambulatory Visit: Payer: Medicare Other | Admitting: Family Medicine

## 2020-05-24 ENCOUNTER — Other Ambulatory Visit: Payer: Self-pay

## 2020-05-24 DIAGNOSIS — E538 Deficiency of other specified B group vitamins: Secondary | ICD-10-CM | POA: Diagnosis not present

## 2020-05-24 MED ORDER — CYANOCOBALAMIN 1000 MCG/ML IJ SOLN
1000.0000 ug | Freq: Once | INTRAMUSCULAR | Status: AC
  Administered 2020-05-24: 1000 ug via INTRAMUSCULAR

## 2020-05-24 NOTE — Progress Notes (Signed)
Per orders of [name], injection of B-12 given by Francella Solian in left deltoid. Patient tolerated injection well. Patient will make appointment for 5 week.

## 2020-06-15 ENCOUNTER — Ambulatory Visit: Payer: Medicare Other | Admitting: Family Medicine

## 2020-06-28 ENCOUNTER — Ambulatory Visit (INDEPENDENT_AMBULATORY_CARE_PROVIDER_SITE_OTHER): Payer: Medicare Other

## 2020-06-28 ENCOUNTER — Other Ambulatory Visit: Payer: Self-pay

## 2020-06-28 DIAGNOSIS — E538 Deficiency of other specified B group vitamins: Secondary | ICD-10-CM | POA: Diagnosis not present

## 2020-06-28 DIAGNOSIS — D649 Anemia, unspecified: Secondary | ICD-10-CM

## 2020-06-28 MED ORDER — CYANOCOBALAMIN 1000 MCG/ML IJ SOLN
1000.0000 ug | Freq: Once | INTRAMUSCULAR | Status: AC
Start: 2020-06-28 — End: 2020-06-28
  Administered 2020-06-28: 1000 ug via INTRAMUSCULAR

## 2020-06-28 NOTE — Progress Notes (Signed)
Per orders of [name], injection of B12 given by Serita Sheller in right deltoid. Patient tolerated injection well.

## 2020-07-25 ENCOUNTER — Other Ambulatory Visit: Payer: Self-pay

## 2020-07-25 ENCOUNTER — Encounter: Payer: Self-pay | Admitting: Endocrinology

## 2020-07-25 ENCOUNTER — Ambulatory Visit (INDEPENDENT_AMBULATORY_CARE_PROVIDER_SITE_OTHER): Payer: Medicare Other | Admitting: Endocrinology

## 2020-07-25 VITALS — BP 150/86 | HR 96 | Ht 74.0 in | Wt 197.0 lb

## 2020-07-25 DIAGNOSIS — E0590 Thyrotoxicosis, unspecified without thyrotoxic crisis or storm: Secondary | ICD-10-CM | POA: Diagnosis not present

## 2020-07-25 LAB — TSH: TSH: 6.65 u[IU]/mL — ABNORMAL HIGH (ref 0.35–4.50)

## 2020-07-25 LAB — T4, FREE: Free T4: 0.68 ng/dL (ref 0.60–1.60)

## 2020-07-25 MED ORDER — METHIMAZOLE 5 MG PO TABS: 2.5000 mg | ORAL_TABLET | Freq: Every day | ORAL | 3 refills | Status: DC

## 2020-07-25 NOTE — Patient Instructions (Addendum)
Your blood pressure is high today.  Please see your primary care provider soon, to have it rechecked Blood tests are requested for you today.  We'll let you know about the results.  If ever you have fever while taking methimazole, stop it and call us, even if the reason is obvious, because of the risk of a rare side-effect.  It is best to never miss the medication.  However, if you do miss it, next best is to double up the next time.   Please come back for a follow-up appointment in 6 months.   

## 2020-07-25 NOTE — Progress Notes (Signed)
Subjective:    Patient ID: Timothy Gallagher, male    DOB: [birth year], 84 y.o.   MRN: [id number]  HPI Pt returns for f/u of hyperthyroidism (dx'ed early 2019; TFT improved initially, but recurred in July, 2019, and was started on tapazole; he has never had thyroid imaging; he takes toprol for CAD, not thyroid; he declines RAI).  pt states he feels wel in general.   Past Medical History:  Diagnosis Date  . ACNE ROSACEA 06/27/2009  . ACTINIC KERATOSIS, HEAD 04/18/2009  . Acute maxillary sinusitis 05/14/2010  . ALLERGIC RHINITIS 04/09/2007  . Anal fissure 04/07/2016  . B12 DEFICIENCY 06/07/2007  . BACK PAIN WITH RADICULOPATHY 04/24/2008  . Cancer ([place])    skin  . CHEST WALL PAIN, ACUTE 06/15/2009  . Chronic maxillary sinusitis 05/29/2008  . COLITIS 04/27/2009  . COLONIC POLYPS, HX OF 04/27/2009   tubular adenomas  . Coronary artery disease 05/12/2014   Cath 05/12/2014 w/ severe 3-vessel CAD and preserved LV function, EF 55%  . DERMATITIS, ATOPIC 10/12/2007  . DIVERTICULOSIS, COLON 04/27/2009  . ECCHYMOSES, SPONTANEOUS 06/27/2008  . Elevated sedimentation rate 05/02/2009  . ESOPHAGEAL STRICTURE 04/27/2009  . GASTRITIS, CHRONIC 04/27/2009  . HYPERLIPIDEMIA 03/13/2008  . HYPERTENSION 04/09/2007  . Internal bleeding hemorrhoids 01/22/2015   01/22/2015 Seen at anoscopy, grade 1 all 3 positions   . Irritable bowel syndrome 08/11/2007  . KIDNEY DISEASE 04/09/2007  . NECK PAIN 09/14/2008  . NEUROPATHY, IDIOPATHIC PERIPHERAL NEC 08/11/2007  . OSTEOARTHRITIS, WRIST, RIGHT 08/05/2010  . Postoperative delirium 05/20/2014  . S/P CABG x 4 05/19/2014   LIMA to LAD, SVG to diag, SVG to OM, SVG to PDA, EVH via right thigh and leg    Past Surgical History:  Procedure Laterality Date  . CARDIAC CATHETERIZATION    . COLONOSCOPY    . CORONARY ARTERY BYPASS GRAFT N/A 05/19/2014   Procedure: CORONARY ARTERY BYPASS GRAFTING (CABG);  Surgeon: Rexene Alberts, MD;  Location: [facility];  Service: Open Heart Surgery;  Laterality: N/A;   Times 4 using left internal mammary artery and endoscopically harvested right saphenous vein  . ESOPHAGOGASTRODUODENOSCOPY    . FINGER SURGERY     cut off end of finger  . FLEXIBLE SIGMOIDOSCOPY    . HEMORRHOID BANDING    . HERNIA REPAIR    . INCISION AND DRAINAGE WOUND WITH FOREIGN BODY REMOVAL Left 12/20/2013   Procedure: INCISION AND DRAINAGE LEFT INDEX FINGER;  Surgeon: Tennis Must, MD;  Location: [facility];  Service: Orthopedics;  Laterality: Left;  . INTRAOPERATIVE TRANSESOPHAGEAL ECHOCARDIOGRAM N/A 05/19/2014   Procedure: INTRAOPERATIVE TRANSESOPHAGEAL ECHOCARDIOGRAM;  Surgeon: Rexene Alberts, MD;  Location: [facility];  Service: Open Heart Surgery;  Laterality: N/A;  . lamenectomy    . LEFT HEART CATHETERIZATION WITH CORONARY ANGIOGRAM N/A 05/12/2014   Procedure: LEFT HEART CATHETERIZATION WITH CORONARY ANGIOGRAM;  Surgeon: Sinclair Grooms, MD;  Location: [facility] CATH LAB;  Service: Cardiovascular;  Laterality: N/A;  . LUMBAR FUSION    . TONSILLECTOMY    . VARICOSE VEIN SURGERY Left     Social History   Socioeconomic History  . Marital status: Married    Spouse name: Not on file  . Number of children: 4  . Years of education: Not on file  . Highest education level: Not on file  Occupational History  . Occupation: retired    Fish farm manager: RETIRED    Comment: from [place] Use  . Smoking status: Former Smoker    Packs/day:  0.50    Years: 42.00    Pack years: 21.00    Types: Cigarettes    Quit date: 11/17/1998    Years since quitting: 21.7  . Smokeless tobacco: Never Used  Vaping Use  . Vaping Use: Never used  Substance and Sexual Activity  . Alcohol use: No  . Drug use: No  . Sexual activity: Yes  Other Topics Concern  . Not on file  Social History Narrative   Married 35 years (2nd marriage). 2 kids from previous marriage (lost a 61rd child hit by car at age 28). 2 stepchildren. 6 grandkids. 2 greatgrandkids.       Retired from [employer]:  walking/exercise, building in shop-furniture (cut finger off in February doing this)   6 grand children - Has bought them all a new car    Brother died of probably addiction   Sister also died; not sure of cause    He enjoys his life; Likes to play the keyboard; guitar    Had played in a gospel quartet    Enjoys working with wood    Social Determinants of Radio broadcast assistant Strain:   . Difficulty of Paying Living Expenses: Not on file  Food Insecurity:   . Worried About Charity fundraiser in the Last Year: Not on file  . Ran Out of Food in the Last Year: Not on file  Transportation Needs:   . Lack of Transportation (Medical): Not on file  . Lack of Transportation (Non-Medical): Not on file  Physical Activity:   . Days of Exercise per Week: Not on file  . Minutes of Exercise per Session: Not on file  Stress:   . Feeling of Stress : Not on file  Social Connections:   . Frequency of Communication with Friends and Family: Not on file  . Frequency of Social Gatherings with Friends and Family: Not on file  . Attends Religious Services: Not on file  . Active Member of Clubs or Organizations: Not on file  . Attends Archivist Meetings: Not on file  . Marital Status: Not on file  Intimate Partner Violence:   . Fear of Current or Ex-Partner: Not on file  . Emotionally Abused: Not on file  . Physically Abused: Not on file  . Sexually Abused: Not on file    Current Outpatient Medications on File Prior to Visit  Medication Sig Dispense Refill  . acetaminophen (TYLENOL) 500 MG tablet Take 500 mg by mouth daily as needed (for pain). Reported on 04/11/2016    . aspirin 81 MG EC tablet Take 1 tablet (81 mg total) by mouth daily.    [name] Kitchen atorvastatin (LIPITOR) 20 MG tablet TAKE 1 TABLET(20 MG) BY MOUTH DAILY 90 tablet 3  . Multiple Vitamins-Minerals (CENTRUM SILVER PO) Take 1 tablet by mouth daily.    . tamsulosin (FLOMAX) 0.4 MG CAPS capsule Take 0.4 mg by mouth. Prescribed  by urology     No current facility-administered medications on file prior to visit.    Allergies  Allergen Reactions  . Lipitor [Atorvastatin] Other (See Comments)    REACTION: nausea and blurred vision  . Trazodone And Nefazodone Other (See Comments)    dizzy  . Ciprofloxacin Swelling  . Mycophenolate Mofetil Other (See Comments)    REACTION: unspecified  . Amoxicillin Rash    Has patient had a PCN reaction causing immediate rash, facial/tongue/throat swelling, SOB or lightheadedness with hypotension: Unknown  Has patient had a PCN reaction causing severe rash involving mucus membranes or skin necrosis: Unknown Has patient had a PCN reaction that required hospitalization: Unknown Has patient had a PCN reaction occurring within the last 10 years: Unknown If all of the above answers are "NO", then may proceed with Cephalosporin use.   [name] Kitchen Penicillins Rash    Has patient had a PCN reaction causing immediate rash, facial/tongue/throat swelling, SOB or lightheadedness with hypotension: Unknown Has patient had a PCN reaction causing severe rash involving mucus membranes or skin necrosis: Unknown Has patient had a PCN reaction that required hospitalization: Unknown Has patient had a PCN reaction occurring within the last 10 years: Unknown If all of the above answers are "NO", then may proceed with Cephalosporin use.   . Rosuvastatin Other (See Comments)    Unknown     Family History  Problem Relation Age of Onset  . Hernia Mother   . Heart disease Father        smoker  . COPD Father   . Heart attack Brother   . Early death Daughter   . Colon cancer Neg Hx     BP (!) 150/86   Pulse 96   Ht 6\' 2"  (1.88 m)   Wt 197 lb (89.4 kg)   SpO2 96%   BMI 25.29 kg/m    Review of Systems Denies fever    Objective:   Physical Exam VITAL SIGNS:  See vs page GENERAL: no distress NECK: There is no palpable thyroid enlargement.  No thyroid nodule is palpable.  No palpable  lymphadenopathy at the anterior neck.     Lab Results  Component Value Date   TSH 6.65 (H) 07/25/2020      Assessment & Plan:  Hypothyroidism: side effect of tapazole.  stop taking the methimazole x 1 week. Then resume at 1/2 pill per day. Please come back for a follow-up appointment in 2 months.

## 2020-07-26 ENCOUNTER — Telehealth: Payer: Self-pay

## 2020-07-26 NOTE — Telephone Encounter (Signed)
RESULTS  Results were reviewed by Dr. Ellison. Called pt to inform about results as well as new orders. Using closed-loop communication, pt verbalized complete acceptance and understanding of all information provided. No further questions nor concerns were voiced at this time. 

## 2020-07-26 NOTE — Telephone Encounter (Signed)
-----   Message from Renato Shin, MD sent at 07/25/2020  6:34 PM EDT ----- please contact patient: Thyroid has gone slightly low.  Please stop taking the methimazole x 1 week.  Then resume at 1/2 pill per day.  Please come back for a follow-up appointment in 2 months.

## 2020-07-30 DIAGNOSIS — H353111 Nonexudative age-related macular degeneration, right eye, early dry stage: Secondary | ICD-10-CM | POA: Diagnosis not present

## 2020-07-30 DIAGNOSIS — H35352 Cystoid macular degeneration, left eye: Secondary | ICD-10-CM | POA: Diagnosis not present

## 2020-08-01 DIAGNOSIS — R351 Nocturia: Secondary | ICD-10-CM | POA: Diagnosis not present

## 2020-08-01 DIAGNOSIS — R3912 Poor urinary stream: Secondary | ICD-10-CM | POA: Diagnosis not present

## 2020-08-02 ENCOUNTER — Other Ambulatory Visit: Payer: Self-pay

## 2020-08-02 ENCOUNTER — Ambulatory Visit (INDEPENDENT_AMBULATORY_CARE_PROVIDER_SITE_OTHER): Payer: Medicare Other | Admitting: *Deleted

## 2020-08-02 ENCOUNTER — Encounter: Payer: Self-pay | Admitting: *Deleted

## 2020-08-02 DIAGNOSIS — E538 Deficiency of other specified B group vitamins: Secondary | ICD-10-CM

## 2020-08-02 MED ORDER — CYANOCOBALAMIN 1000 MCG/ML IJ SOLN
1000.0000 ug | Freq: Once | INTRAMUSCULAR | Status: AC
  Administered 2020-08-02: 1000 ug via INTRAMUSCULAR

## 2020-08-02 NOTE — Progress Notes (Signed)
I have reviewed and agree with note, evaluation, plan.   Tyneisha Hegeman, MD  

## 2020-08-02 NOTE — Progress Notes (Signed)
Per orders of [name], injection of Cyanocobalamin 1000 mcg given by Anselmo Pickler, LPN in left deltoid. Patient tolerated injection well. Patient will make appointment for 1 month

## 2020-08-06 ENCOUNTER — Ambulatory Visit (INDEPENDENT_AMBULATORY_CARE_PROVIDER_SITE_OTHER): Payer: Medicare Other | Admitting: Family Medicine

## 2020-08-06 ENCOUNTER — Encounter: Payer: Self-pay | Admitting: Family Medicine

## 2020-08-06 ENCOUNTER — Other Ambulatory Visit: Payer: Self-pay

## 2020-08-06 ENCOUNTER — Telehealth: Payer: Self-pay | Admitting: Family Medicine

## 2020-08-06 VITALS — BP 110/70 | HR 83 | Temp 98.8°F | Ht 74.0 in | Wt 196.4 lb

## 2020-08-06 DIAGNOSIS — I10 Essential (primary) hypertension: Secondary | ICD-10-CM

## 2020-08-06 DIAGNOSIS — E538 Deficiency of other specified B group vitamins: Secondary | ICD-10-CM

## 2020-08-06 DIAGNOSIS — R413 Other amnesia: Secondary | ICD-10-CM | POA: Diagnosis not present

## 2020-08-06 DIAGNOSIS — R5383 Other fatigue: Secondary | ICD-10-CM

## 2020-08-06 NOTE — Telephone Encounter (Signed)
Patients wife called in and just wanted to let Dr.Hunter know that she thinks her husband has Dementia and in the last month it has gotten a lot worse. Patient wife is very worried she doesn't know how to bring it up to him without his getting defensive.

## 2020-08-06 NOTE — Patient Instructions (Addendum)
Health Maintenance Due  Topic Date Due  . INFLUENZA VACCINE Declined in office flu shot . You wanted to feel a little better 06/17/2020   covid 19 booster is due when you feel up to it at your pharmacy  Things seem pretty stable to me on your energy- you seem to have ebs and flows in this and also you recently had your thyroid medicine adjusted and I wonder if that contributes.   You did have some decreased memory on exam and I referred you to neurology. We will call you within two weeks about your referral to neurology. If you do not hear within 3 weeks, give [place] a call.   Please stop by lab before you go If you have mychart- we will send your results within 3 business days of [place] receiving them.  If you do not have mychart- we will call you about results within 5 business days of [place] receiving them.  *please note we are currently using [facility] which has a longer processing time than [place] typically so labs may not come back as quickly as in the past *please also note that you will see labs on mychart as soon as they post. I will later go in and write notes on them- will say "notes from [name]"  Recommended follow up: keep October 13th visit so we can check back in and se ehow you are doing- if you are feeling worse please let us know.

## 2020-08-06 NOTE — Telephone Encounter (Signed)
See below

## 2020-08-06 NOTE — Telephone Encounter (Signed)
Noted! Thank you

## 2020-08-06 NOTE — Progress Notes (Addendum)
Phone [phone] In person visit   Subjective:   Timothy Gallagher is a 84 y.o. year old very pleasant male patient who presents for/with See problem oriented charting Chief Complaint  Patient presents with  . Fatigue   This visit occurred during the SARS-CoV-2 public health emergency.  Safety protocols were in place, including screening questions prior to the visit, additional usage of staff PPE, and extensive cleaning of exam room while observing appropriate contact time as indicated for disinfecting solutions.   Past Medical History-  Patient Active Problem List   Diagnosis Date Noted  . Hyperthyroidism 01/13/2018    Priority: High  . Detached retina, left 06/30/2017    Priority: High  . AAA (abdominal aortic aneurysm) ([place]) 05/12/2017    Priority: High  . CAD (coronary artery disease) of artery bypass graft 05/19/2014    Priority: High  . History of atrial fibrillation 08/26/2019    Priority: Medium  . BPPV (benign paroxysmal positional vertigo) 09/01/2016    Priority: Medium  . Insomnia 11/22/2014    Priority: Medium  . Anemia 07/20/2014    Priority: Medium  . BPH (benign prostatic hyperplasia) 12/05/2013    Priority: Medium  . Hyperlipidemia 03/13/2008    Priority: Medium  . B12 deficiency 06/07/2007    Priority: Medium  . Essential hypertension 04/09/2007    Priority: Medium  . Chronic obstructive pulmonary disease ([place]) 08/26/2019    Priority: Low  . Left sided abdominal pain 04/15/2017    Priority: Low  . Low back pain 02/06/2017    Priority: Low  . Anal fissure 04/07/2016    Priority: Low  . Internal and external bleeding hemorrhoids 01/22/2015    Priority: Low  . Palpitations 02/11/2013    Priority: Low  . OSTEOARTHRITIS, WRIST, RIGHT 08/05/2010    Priority: Low  . ACNE ROSACEA 06/27/2009    Priority: Low  . ESOPHAGEAL STRICTURE 04/27/2009    Priority: Low  . ACTINIC KERATOSIS, HEAD 04/18/2009    Priority: Low  . NECK PAIN 09/14/2008     Priority: Low  . NEUROPATHY, IDIOPATHIC PERIPHERAL NEC 08/11/2007    Priority: Low  . Irritable bowel syndrome 08/11/2007    Priority: Low  . ALLERGIC RHINITIS 04/09/2007    Priority: Low    Medications- reviewed and updated Current Outpatient Medications  Medication Sig Dispense Refill  . acetaminophen (TYLENOL) 500 MG tablet Take 500 mg by mouth daily as needed (for pain). Reported on 04/11/2016    . aspirin 81 MG EC tablet Take 1 tablet (81 mg total) by mouth daily.    [name] Kitchen atorvastatin (LIPITOR) 20 MG tablet TAKE 1 TABLET(20 MG) BY MOUTH DAILY 90 tablet 3  . methimazole (TAPAZOLE) 5 MG tablet Take 0.5 tablets (2.5 mg total) by mouth daily. 30 tablet 3  . Multiple Vitamins-Minerals (CENTRUM SILVER PO) Take 1 tablet by mouth daily.    . tamsulosin (FLOMAX) 0.4 MG CAPS capsule Take 0.4 mg by mouth. Prescribed by urology     No current facility-administered medications for this visit.     Objective:  BP 110/70   Pulse 83   Temp 98.8 F (37.1 C) (Temporal)   Ht 6\' 2"  (1.88 m)   Wt 196 lb 6.4 oz (89.1 kg)   SpO2 95%   BMI 25.22 kg/m  Gen: NAD, resting comfortably CV: RRR no murmurs rubs or gallops Lungs: CTAB no crackles, wheeze, rhonchi Abdomen: soft/nontender/nondistended/normal bowel sounds.  Ext: no edema Skin: warm, dry Neuro: MMSE 25/30 including losing 2 for  delayed recall    Assessment and Plan  #Fatigue/low energy S: Patient mentioned that he has been feeling fatigued recently. He feels like his energy levels have been lower for at least 3 weeks but always feels somewhat weak. He feels like he has been less balanced- worse with standing but is on flomax. He received his Vitamin B12 injection last week- has not missed doses. No chest pain or shortness of breath with it  Outside of fatigue- no covid symptoms  Two nights ago didn't sleep well and felt very poorly yesterday- feels better today after sleeping well. Decided to schedule visit to see what might be causing  fatigue Lab Results  Component Value Date   VITAMINB12 >1526 (H) 04/20/2020  A/P: unclear cause of fatigue but appears to primarily be a chronic issue. Check cbc, cmp. tsh followed by endocrine (recent adjustment and that could have contributed to symptoms). Will check b12.  -reports staying well hydrated at lest 3-4 16 oz glasses of water a day - I think his balance issues with standing could be contirbuted to by flomax but with prior UTI issues leading to hospitalization I dont think its wise to reduce dose -chronic fatigue. From original reports thought this was more acute and testing was ordered for covid (doubt this is the cause of symptoms) - no chest pain or shortness of breath reported- doubt CAD as cause -worsening fatigue yesterday may have bene related to poor nights sleep - feels better today - no burning with peeing or frequent urination to suggest UTI (or chills as hes had before)  # Memory loss S:wife reported concern about potential memory loss/dementia. Patient states he has had issues at least a year- he thinks pretty stable recently. Wife reports worsening. He seems somewhat defensive about the issu  A/P: memory loss with MMSE 25/30 today with HS education and reported worsening of memory per wife mychart message -refer to neurology -b12 levels have been normal but will check today - tsh level being worked on with endocrine - does not need HIV/rpr screening- only active with wife -denies depressed mood  # b12 deficiency S:compliant with monthly shots- had last week. Did space to 5 weeks so will check to make sure levels ok Lab Results  Component Value Date   VITAMINB12 >1526 (H) 04/20/2020    A/P: hopefully b12 controlled. Update level today and plan to continue monthly injections   Recommended follow up: as needed for new or worsening symptoms Future Appointments  Date Time Provider [place]  08/29/2020 10:40 AM [name], MD [facility]    09/04/2020  9:30 AM [facility] NURSE [facility]  10/01/2020 10:00 AM Renato Shin, MD [facility] None  01/24/2021 10:00 AM Renato Shin, MD [facility] None    Lab/Order associations:   ICD-10-CM   1. Memory loss  R41.3 Ambulatory referral to Neurology  2. B12 deficiency  E53.8 Vitamin B12    CANCELED: Vitamin B12  3. Essential hypertension  I10 CBC With Differential/Platelet    COMPLETE METABOLIC PANEL WITH GFR  4. Fatigue, unspecified type  R53.83 Novel Coronavirus, NAA ([facility])    Return precautions advised.  [name], MD

## 2020-08-06 NOTE — Addendum Note (Signed)
Addended by: Milton Ferguson D on: 08/06/2020 04:26 PM   Modules accepted: Orders

## 2020-08-06 NOTE — Addendum Note (Signed)
Addended by: Thomes Cake on: 08/06/2020 04:37 PM   Modules accepted: Orders

## 2020-08-07 LAB — COMPLETE METABOLIC PANEL WITH GFR
AG Ratio: 1.4 (calc) (ref 1.0–2.5)
ALT: 9 U/L (ref 9–46)
AST: 14 U/L (ref 10–35)
Albumin: 3.8 g/dL (ref 3.6–5.1)
Alkaline phosphatase (APISO): 77 U/L (ref 35–144)
BUN: 18 mg/dL (ref 7–25)
CO2: 25 mmol/L (ref 20–32)
Calcium: 8.8 mg/dL (ref 8.6–10.3)
Chloride: 107 mmol/L (ref 98–110)
Creat: 0.98 mg/dL (ref 0.70–1.11)
GFR, Est African American: 83 mL/min/{1.73_m2} (ref >=60)
GFR, Est Non African American: 72 mL/min/{1.73_m2} (ref >=60)
Globulin: 2.8 g/dL (calc) (ref 1.9–3.7)
Glucose, Bld: 92 mg/dL (ref 65–99)
Potassium: 4.3 mmol/L (ref 3.5–5.3)
Sodium: 142 mmol/L (ref 135–146)
Total Bilirubin: 0.4 mg/dL (ref 0.2–1.2)
Total Protein: 6.6 g/dL (ref 6.1–8.1)

## 2020-08-07 LAB — CBC WITH DIFFERENTIAL/PLATELET
Absolute Monocytes: 489 cells/uL (ref 200–950)
Basophils Absolute: 19 cells/uL (ref 0–200)
Basophils Relative: 0.4 %
Eosinophils Absolute: 193 cells/uL (ref 15–500)
Eosinophils Relative: 4.1 %
HCT: 37.2 % — ABNORMAL LOW (ref 38.5–50.0)
Hemoglobin: 12.3 g/dL — ABNORMAL LOW (ref 13.2–17.1)
Lymphs Abs: 1716 cells/uL (ref 850–3900)
MCH: 28.6 pg (ref 27.0–33.0)
MCHC: 33.1 g/dL (ref 32.0–36.0)
MCV: 86.5 fL (ref 80.0–100.0)
MPV: 10.1 fL (ref 7.5–12.5)
Monocytes Relative: 10.4 %
Neutro Abs: 2284 cells/uL (ref 1500–7800)
Neutrophils Relative %: 48.6 %
Platelets: 172 10*3/uL (ref 140–400)
RBC: 4.3 10*6/uL (ref 4.20–5.80)
RDW: 13.9 % (ref 11.0–15.0)
Total Lymphocyte: 36.5 %
WBC: 4.7 10*3/uL (ref 3.8–10.8)

## 2020-08-07 LAB — VITAMIN B12: Vitamin B-12: 579 pg/mL (ref 200–1100)

## 2020-08-07 LAB — SARS-COV-2, NAA 2 DAY TAT

## 2020-08-07 LAB — NOVEL CORONAVIRUS, NAA: SARS-CoV-2, NAA: NOT DETECTED

## 2020-08-08 ENCOUNTER — Ambulatory Visit: Payer: Medicare Other | Admitting: Family Medicine

## 2020-08-09 ENCOUNTER — Ambulatory Visit: Payer: Medicare Other | Admitting: Family Medicine

## 2020-08-28 NOTE — Progress Notes (Signed)
Phone [phone] In person visit   Subjective:   Timothy Gallagher is a 84 y.o. year old very pleasant male patient who presents for/with See problem oriented charting Chief Complaint  Patient presents with  . Annual Exam    This visit occurred during the SARS-CoV-2 public health emergency.  Safety protocols were in place, including screening questions prior to the visit, additional usage of staff PPE, and extensive cleaning of exam room while observing appropriate contact time as indicated for disinfecting solutions.   Past Medical History-  Patient Active Problem List   Diagnosis Date Noted  . Hyperthyroidism 01/13/2018    Priority: High  . Detached retina, left 06/30/2017    Priority: High  . AAA (abdominal aortic aneurysm) ([place]) 05/12/2017    Priority: High  . CAD (coronary artery disease) of artery bypass graft 05/19/2014    Priority: High  . History of atrial fibrillation 08/26/2019    Priority: Medium  . BPPV (benign paroxysmal positional vertigo) 09/01/2016    Priority: Medium  . Insomnia 11/22/2014    Priority: Medium  . Anemia 07/20/2014    Priority: Medium  . BPH (benign prostatic hyperplasia) 12/05/2013    Priority: Medium  . Hyperlipidemia 03/13/2008    Priority: Medium  . B12 deficiency 06/07/2007    Priority: Medium  . Essential hypertension 04/09/2007    Priority: Medium  . Chronic obstructive pulmonary disease ([place]) 08/26/2019    Priority: Low  . Left sided abdominal pain 04/15/2017    Priority: Low  . Low back pain 02/06/2017    Priority: Low  . Anal fissure 04/07/2016    Priority: Low  . Internal and external bleeding hemorrhoids 01/22/2015    Priority: Low  . Palpitations 02/11/2013    Priority: Low  . OSTEOARTHRITIS, WRIST, RIGHT 08/05/2010    Priority: Low  . ACNE ROSACEA 06/27/2009    Priority: Low  . ESOPHAGEAL STRICTURE 04/27/2009    Priority: Low  . ACTINIC KERATOSIS, HEAD 04/18/2009    Priority: Low  . NECK PAIN 09/14/2008     Priority: Low  . NEUROPATHY, IDIOPATHIC PERIPHERAL NEC 08/11/2007    Priority: Low  . Irritable bowel syndrome 08/11/2007    Priority: Low  . ALLERGIC RHINITIS 04/09/2007    Priority: Low    Medications- reviewed and updated Current Outpatient Medications  Medication Sig Dispense Refill  . acetaminophen (TYLENOL) 500 MG tablet Take 500 mg by mouth daily as needed (for pain). Reported on 04/11/2016    . atorvastatin (LIPITOR) 20 MG tablet TAKE 1 TABLET(20 MG) BY MOUTH DAILY 90 tablet 3  . methimazole (TAPAZOLE) 5 MG tablet Take 0.5 tablets (2.5 mg total) by mouth daily. 30 tablet 3  . Multiple Vitamins-Minerals (CENTRUM SILVER PO) Take 1 tablet by mouth daily.    . tamsulosin (FLOMAX) 0.4 MG CAPS capsule Take 0.4 mg by mouth. Prescribed by urology    . aspirin 81 MG EC tablet Take 1 tablet (81 mg total) by mouth daily. (Patient not taking: Reported on 08/29/2020)     No current facility-administered medications for this visit.     Objective:  BP 122/70   Pulse 70   Temp 98.7 F (37.1 C) (Temporal)   Resp 18   Ht 6\' 2"  (1.88 m)   Wt 197 lb 6.4 oz (89.5 kg)   SpO2 98%   BMI 25.34 kg/m  Gen: NAD, resting comfortably CV: RRR no murmurs rubs or gallops Lungs: CTAB no crackles, wheeze, rhonchi Ext: no edema Skin: warm, dry  Assessment and Plan   #Fatigue/low energy-chronic issue at this point. CBC, CMP, TSH (followed by endocrinology), B12 largely normal about a month ago. We did Covid testing as well which was negative at last visit-done due to acute worsening. He states has improved some. Walking seems to help some- walks around the house mainly- encouraged him to keep moving   #Memory loss wife has been concerned about potential memory loss/dementia. Patient admitted having memory issues for at least a year. Wife felt like symptoms were worsening. MMSE 25/30 last visit. We referred to neurology. Declined HIV/RPR screening. Declined depressed mood. - he declined neurology  foloow up when called- still wants to hold off for now- likely recheck MMSe every 6 months at least  #B12 deficiency-patient doing B12 injections every 5 weeks. B12 levels have been adequate  #CAD with CABG without angina- no chest pain or shortness of breath #hyperlipidemia S: Medication: Aspirin 81 mg, atorvastatin 20 mg. Lab Results  Component Value Date   CHOL 125 04/20/2020   HDL 42.20 04/20/2020   LDLCALC 61 04/20/2020   LDLDIRECT 91.0 05/23/2016   TRIG 109.0 04/20/2020   CHOLHDL 3 04/20/2020   A/P: CAD stable/asymptomatic- continue current meds. LD at goal with LDL under 70 within 4 months.   #hypertension S: medication: no medicine. Still with some intermittent orthostatic symptoms (tamsulosin likely contributes) BP Readings from Last 3 Encounters:  08/29/20 122/70  08/06/20 110/70  07/25/20 (!) 150/86  A/P: controlled without medicine- continue to monitor.    #BPH S:  Reasonable control on tamsulosin . Still with some frequency A/P: remain on tamsulosin despite orthostatic symptoms with history of history of severe UTI/sepsis.   #Hypothyroidism-follows with Dr. Loanne Drilling. Remains on methimazole.  #History of atrial fibrillation-no obvious recurrence since 2015. Cardiology has been okay with aspirin alone and no NOAC.  # vision issues after prior detached retina  # back pain- prior surgery and if not careful can tweak the back  #AAA 3.3 cm in 2018 and 2021- plan for 3 year repeat so march 2024. Continue risk factor modification  Recommended follow up: Return in about 4 months (around 12/30/2020). Future Appointments  Date Time Provider [place]  09/04/2020  9:30 AM [facility] NURSE [facility]  10/01/2020 10:00 AM Renato Shin, MD [facility] None  01/24/2021 10:00 AM Renato Shin, MD [facility] None   Lab/Order associations:   ICD-10-CM   1. Essential hypertension  I10   2. B12 deficiency  E53.8   3. Hyperlipidemia, unspecified hyperlipidemia type   E78.5   4. Coronary artery disease involving coronary bypass graft of native heart without angina pectoris  I25.810   5. Abdominal aortic aneurysm (AAA) without rupture (H[facility])  I71.4   6. Benign prostatic hyperplasia with nocturia  N40.1    R35.1   7. Hyperthyroidism  E05.90    Return precautions advised.  [name], MD

## 2020-08-28 NOTE — Patient Instructions (Addendum)
b12 injection today  4 month follow up or sooner if you need it- make sure to get monthly or every 5 week b12 injections  No changes today  Can call neurology at [place] back if you change your mind on memory testing. We will plan to recheck every 6-12 months

## 2020-08-29 ENCOUNTER — Encounter: Payer: Self-pay | Admitting: Family Medicine

## 2020-08-29 ENCOUNTER — Other Ambulatory Visit: Payer: Self-pay

## 2020-08-29 ENCOUNTER — Ambulatory Visit (INDEPENDENT_AMBULATORY_CARE_PROVIDER_SITE_OTHER): Payer: Medicare Other | Admitting: Family Medicine

## 2020-08-29 VITALS — BP 122/70 | HR 70 | Temp 98.7°F | Resp 18 | Ht 74.0 in | Wt 197.4 lb

## 2020-08-29 DIAGNOSIS — E0590 Thyrotoxicosis, unspecified without thyrotoxic crisis or storm: Secondary | ICD-10-CM

## 2020-08-29 DIAGNOSIS — R351 Nocturia: Secondary | ICD-10-CM

## 2020-08-29 DIAGNOSIS — I10 Essential (primary) hypertension: Secondary | ICD-10-CM | POA: Diagnosis not present

## 2020-08-29 DIAGNOSIS — I714 Abdominal aortic aneurysm, without rupture, unspecified: Secondary | ICD-10-CM

## 2020-08-29 DIAGNOSIS — E785 Hyperlipidemia, unspecified: Secondary | ICD-10-CM | POA: Diagnosis not present

## 2020-08-29 DIAGNOSIS — I25810 Atherosclerosis of coronary artery bypass graft(s) without angina pectoris: Secondary | ICD-10-CM | POA: Diagnosis not present

## 2020-08-29 DIAGNOSIS — N401 Enlarged prostate with lower urinary tract symptoms: Secondary | ICD-10-CM

## 2020-08-29 DIAGNOSIS — E538 Deficiency of other specified B group vitamins: Secondary | ICD-10-CM | POA: Diagnosis not present

## 2020-08-29 MED ORDER — CYANOCOBALAMIN 1000 MCG/ML IJ SOLN
1000.0000 ug | Freq: Once | INTRAMUSCULAR | Status: AC
  Administered 2020-08-29: 1000 ug via INTRAMUSCULAR

## 2020-08-29 NOTE — Addendum Note (Signed)
Addended by: Thomes Cake on: 08/29/2020 02:37 PM   Modules accepted: Orders

## 2020-08-31 DIAGNOSIS — H524 Presbyopia: Secondary | ICD-10-CM | POA: Diagnosis not present

## 2020-09-04 ENCOUNTER — Ambulatory Visit: Payer: Medicare Other

## 2020-09-21 ENCOUNTER — Ambulatory Visit: Payer: Medicare Other

## 2020-09-27 ENCOUNTER — Ambulatory Visit (INDEPENDENT_AMBULATORY_CARE_PROVIDER_SITE_OTHER): Payer: Medicare Other

## 2020-09-27 DIAGNOSIS — Z0000 Encounter for general adult medical examination without abnormal findings: Secondary | ICD-10-CM | POA: Diagnosis not present

## 2020-09-27 NOTE — Patient Instructions (Signed)
Timothy Gallagher , Thank you for taking time to come for your Medicare Wellness Visit. I appreciate your ongoing commitment to your health goals. Please review the following plan we discussed and let me know if I can assist you in the future.   Screening recommendations/referrals: Colonoscopy: No longer required Recommended yearly ophthalmology/optometry visit for glaucoma screening and checkup Recommended yearly dental visit for hygiene and checkup  Vaccinations: Influenza vaccine: Done 08/22/20 Up to date Pneumococcal vaccine: Up to date Tdap vaccine: Up to date Shingles vaccine: Completed 08/15/19 7 10/31/19   Covid-19: Completed 11/28/19 & 12/22/19  Advanced directives: Declined at this time  Conditions/risks identified: none at this time  Next appointment: Follow up in one year for your annual wellness visit.   Preventive Care 1 Years and Older, Male Preventive care refers to lifestyle choices and visits with your health care provider that can promote health and wellness. What does preventive care include?  A yearly physical exam. This is also called an annual well check.  Dental exams once or twice a year.  Routine eye exams. Ask your health care provider how often you should have your eyes checked.  Personal lifestyle choices, including:  Daily care of your teeth and gums.  Regular physical activity.  Eating a healthy diet.  Avoiding tobacco and drug use.  Limiting alcohol use.  Practicing safe sex.  Taking low doses of aspirin every day.  Taking vitamin and mineral supplements as recommended by your health care provider. What happens during an annual well check? The services and screenings done by your health care provider during your annual well check will depend on your age, overall health, lifestyle risk factors, and family history of disease. Counseling  Your health care provider may ask you questions about your:  Alcohol use.  Tobacco use.  Drug  use.  Emotional well-being.  Home and relationship well-being.  Sexual activity.  Eating habits.  History of falls.  Memory and ability to understand (cognition).  Work and work Statistician. Screening  You may have the following tests or measurements:  Height, weight, and BMI.  Blood pressure.  Lipid and cholesterol levels. These may be checked every 5 years, or more frequently if you are over 47 years old.  Skin check.  Lung cancer screening. You may have this screening every year starting at age 18 if you have a 30-pack-year history of smoking and currently smoke or have quit within the past 15 years.  Fecal occult blood test (FOBT) of the stool. You may have this test every year starting at age 59.  Flexible sigmoidoscopy or colonoscopy. You may have a sigmoidoscopy every 5 years or a colonoscopy every 10 years starting at age 23.  Prostate cancer screening. Recommendations will vary depending on your family history and other risks.  Hepatitis C blood test.  Hepatitis B blood test.  Sexually transmitted disease (STD) testing.  Diabetes screening. This is done by checking your blood sugar (glucose) after you have not eaten for a while (fasting). You may have this done every 1-3 years.  Abdominal aortic aneurysm (AAA) screening. You may need this if you are a current or former smoker.  Osteoporosis. You may be screened starting at age 44 if you are at high risk. Talk with your health care provider about your test results, treatment options, and if necessary, the need for more tests. Vaccines  Your health care provider may recommend certain vaccines, such as:  Influenza vaccine. This is recommended every year.  Tetanus,  diphtheria, and acellular pertussis (Tdap, Td) vaccine. You may need a Td booster every 10 years.  Zoster vaccine. You may need this after age 37.  Pneumococcal 13-valent conjugate (PCV13) vaccine. One dose is recommended after age  61.  Pneumococcal polysaccharide (PPSV23) vaccine. One dose is recommended after age 79. Talk to your health care provider about which screenings and vaccines you need and how often you need them. This information is not intended to replace advice given to you by your health care provider. Make sure you discuss any questions you have with your health care provider. Document Released: 11/30/2015 Document Revised: 07/23/2016 Document Reviewed: 09/04/2015 Elsevier Interactive Patient Education  2017 [place] Prevention in the Home Falls can cause injuries. They can happen to people of all ages. There are many things you can do to make your home safe and to help prevent falls. What can I do on the outside of my home?  Regularly fix the edges of walkways and driveways and fix any cracks.  Remove anything that might make you trip as you walk through a door, such as a raised step or threshold.  Trim any bushes or trees on the path to your home.  Use bright outdoor lighting.  Clear any walking paths of anything that might make someone trip, such as rocks or tools.  Regularly check to see if handrails are loose or broken. Make sure that both sides of any steps have handrails.  Any raised decks and porches should have guardrails on the edges.  Have any leaves, snow, or ice cleared regularly.  Use sand or salt on walking paths during winter.  Clean up any spills in your garage right away. This includes oil or grease spills. What can I do in the bathroom?  Use night lights.  Install grab bars by the toilet and in the tub and shower. Do not use towel bars as grab bars.  Use non-skid mats or decals in the tub or shower.  If you need to sit down in the shower, use a plastic, non-slip stool.  Keep the floor dry. Clean up any water that spills on the floor as soon as it happens.  Remove soap buildup in the tub or shower regularly.  Attach bath mats securely with double-sided  non-slip rug tape.  Do not have throw rugs and other things on the floor that can make you trip. What can I do in the bedroom?  Use night lights.  Make sure that you have a light by your bed that is easy to reach.  Do not use any sheets or blankets that are too big for your bed. They should not hang down onto the floor.  Have a firm chair that has side arms. You can use this for support while you get dressed.  Do not have throw rugs and other things on the floor that can make you trip. What can I do in the kitchen?  Clean up any spills right away.  Avoid walking on wet floors.  Keep items that you use a lot in easy-to-reach places.  If you need to reach something above you, use a strong step stool that has a grab bar.  Keep electrical cords out of the way.  Do not use floor polish or wax that makes floors slippery. If you must use wax, use non-skid floor wax.  Do not have throw rugs and other things on the floor that can make you trip. What can I do with  my stairs?  Do not leave any items on the stairs.  Make sure that there are handrails on both sides of the stairs and use them. Fix handrails that are broken or loose. Make sure that handrails are as long as the stairways.  Check any carpeting to make sure that it is firmly attached to the stairs. Fix any carpet that is loose or worn.  Avoid having throw rugs at the top or bottom of the stairs. If you do have throw rugs, attach them to the floor with carpet tape.  Make sure that you have a light switch at the top of the stairs and the bottom of the stairs. If you do not have them, ask someone to add them for you. What else can I do to help prevent falls?  Wear shoes that:  Do not have high heels.  Have rubber bottoms.  Are comfortable and fit you well.  Are closed at the toe. Do not wear sandals.  If you use a stepladder:  Make sure that it is fully opened. Do not climb a closed stepladder.  Make sure that both  sides of the stepladder are locked into place.  Ask someone to hold it for you, if possible.  Clearly mark and make sure that you can see:  Any grab bars or handrails.  First and last steps.  Where the edge of each step is.  Use tools that help you move around (mobility aids) if they are needed. These include:  Canes.  Walkers.  Scooters.  Crutches.  Turn on the lights when you go into a dark area. Replace any light bulbs as soon as they burn out.  Set up your furniture so you have a clear path. Avoid moving your furniture around.  If any of your floors are uneven, fix them.  If there are any pets around you, be aware of where they are.  Review your medicines with your doctor. Some medicines can make you feel dizzy. This can increase your chance of falling. Ask your doctor what other things that you can do to help prevent falls. This information is not intended to replace advice given to you by your health care provider. Make sure you discuss any questions you have with your health care provider. Document Released: 08/30/2009 Document Revised: 04/10/2016 Document Reviewed: 12/08/2014 Elsevier Interactive Patient Education  2017 [employer].

## 2020-09-27 NOTE — Progress Notes (Addendum)
Virtual Visit via Telephone Note  I connected with  Timothy Gallagher on 09/27/20 at  1:45 PM EST by telephone and verified that I am speaking with the correct person using two identifiers.  Medicare Annual Wellness visit completed telephonically due to Covid-19 pandemic.   Persons participating in this call: This Health Coach and this patient and wife Timothy Gallagher   Location: Patient: Home Provider: Office   I discussed the limitations, risks, security and privacy concerns of performing an evaluation and management service by telephone and the availability of in person appointments. The patient expressed understanding and agreed to proceed.  Unable to perform video visit due to video visit attempted and failed and/or patient does not have video capability.   Some vital signs may be absent or patient reported.   Willette Brace, LPN    Subjective:   Timothy Gallagher is a 84 y.o. male who presents for Medicare Annual/Subsequent preventive examination.  Review of Systems     Cardiac Risk Factors include: advanced age (>71men, >41 women);male gender;hypertension;dyslipidemia     Objective:    There were no vitals filed for this visit. There is no height or weight on file to calculate BMI.  Advanced Directives 12/31/2019 08/12/2019 06/14/2018 07/31/2017 07/31/2017 04/07/2017 05/20/2014  Does Patient Have a Medical Advance Directive? No No No No No No Patient does not have advance directive  Would patient like information on creating a medical advance directive? No - Patient declined Yes (MAU/Ambulatory/Procedural Areas - Information given) No - Patient declined No - Patient declined - - -  Pre-existing out of facility DNR order (yellow form or pink MOST form) - - - - - - No    Current Medications (verified) Outpatient Encounter Medications as of 09/27/2020  Medication Sig  . acetaminophen (TYLENOL) 500 MG tablet Take 500 mg by mouth daily as needed (for pain). Reported on 04/11/2016  .  atorvastatin (LIPITOR) 20 MG tablet TAKE 1 TABLET(20 MG) BY MOUTH DAILY  . methimazole (TAPAZOLE) 5 MG tablet Take 0.5 tablets (2.5 mg total) by mouth daily.  . Multiple Vitamins-Minerals (CENTRUM SILVER PO) Take 1 tablet by mouth daily.  . tamsulosin (FLOMAX) 0.4 MG CAPS capsule Take 0.4 mg by mouth. Prescribed by urology  . aspirin 81 MG EC tablet Take 1 tablet (81 mg total) by mouth daily. (Patient not taking: Reported on 08/29/2020)   No facility-administered encounter medications on file as of 09/27/2020.    Allergies (verified) Lipitor [atorvastatin], Trazodone and nefazodone, Ciprofloxacin, Mycophenolate mofetil, Amoxicillin, Penicillins, and Rosuvastatin   History: Past Medical History:  Diagnosis Date  . ACNE ROSACEA 06/27/2009  . ACTINIC KERATOSIS, HEAD 04/18/2009  . Acute maxillary sinusitis 05/14/2010  . ALLERGIC RHINITIS 04/09/2007  . Anal fissure 04/07/2016  . B12 DEFICIENCY 06/07/2007  . BACK PAIN WITH RADICULOPATHY 04/24/2008  . Cancer ([place])    skin  . CHEST WALL PAIN, ACUTE 06/15/2009  . Chronic maxillary sinusitis 05/29/2008  . COLITIS 04/27/2009  . COLONIC POLYPS, HX OF 04/27/2009   tubular adenomas  . Coronary artery disease 05/12/2014   Cath 05/12/2014 w/ severe 3-vessel CAD and preserved LV function, EF 55%  . DERMATITIS, ATOPIC 10/12/2007  . DIVERTICULOSIS, COLON 04/27/2009  . ECCHYMOSES, SPONTANEOUS 06/27/2008  . Elevated sedimentation rate 05/02/2009  . ESOPHAGEAL STRICTURE 04/27/2009  . GASTRITIS, CHRONIC 04/27/2009  . HYPERLIPIDEMIA 03/13/2008  . HYPERTENSION 04/09/2007  . Internal bleeding hemorrhoids 01/22/2015   01/22/2015 Seen at anoscopy, grade 1 all 3 positions   . Irritable bowel syndrome  08/11/2007  . KIDNEY DISEASE 04/09/2007  . NECK PAIN 09/14/2008  . NEUROPATHY, IDIOPATHIC PERIPHERAL NEC 08/11/2007  . OSTEOARTHRITIS, WRIST, RIGHT 08/05/2010  . Postoperative delirium 05/20/2014  . S/P CABG x 4 05/19/2014   LIMA to LAD, SVG to diag, SVG to OM, SVG to PDA, EVH via  right thigh and leg   Past Surgical History:  Procedure Laterality Date  . CARDIAC CATHETERIZATION    . COLONOSCOPY    . CORONARY ARTERY BYPASS GRAFT N/A 05/19/2014   Procedure: CORONARY ARTERY BYPASS GRAFTING (CABG);  Surgeon: Rexene Alberts, MD;  Location: [facility];  Service: Open Heart Surgery;  Laterality: N/A;  Times 4 using left internal mammary artery and endoscopically harvested right saphenous vein  . ESOPHAGOGASTRODUODENOSCOPY    . FINGER SURGERY     cut off end of finger  . FLEXIBLE SIGMOIDOSCOPY    . HEMORRHOID BANDING    . HERNIA REPAIR    . INCISION AND DRAINAGE WOUND WITH FOREIGN BODY REMOVAL Left 12/20/2013   Procedure: INCISION AND DRAINAGE LEFT INDEX FINGER;  Surgeon: Tennis Must, MD;  Location: [facility];  Service: Orthopedics;  Laterality: Left;  . INTRAOPERATIVE TRANSESOPHAGEAL ECHOCARDIOGRAM N/A 05/19/2014   Procedure: INTRAOPERATIVE TRANSESOPHAGEAL ECHOCARDIOGRAM;  Surgeon: Rexene Alberts, MD;  Location: [facility];  Service: Open Heart Surgery;  Laterality: N/A;  . lamenectomy    . LEFT HEART CATHETERIZATION WITH CORONARY ANGIOGRAM N/A 05/12/2014   Procedure: LEFT HEART CATHETERIZATION WITH CORONARY ANGIOGRAM;  Surgeon: Sinclair Grooms, MD;  Location: [facility];  Service: Cardiovascular;  Laterality: N/A;  . LUMBAR FUSION    . TONSILLECTOMY    . VARICOSE VEIN SURGERY Left    Family History  Problem Relation Age of Onset  . Hernia Mother   . Heart disease Father        smoker  . COPD Father   . Heart attack Brother   . Early death Daughter   . Colon cancer Neg Hx    Social History   Socioeconomic History  . Marital status: Married    Spouse name: Not on file  . Number of children: 4  . Years of education: Not on file  . Highest education level: Not on file  Occupational History  . Occupation: retired    Fish farm manager: RETIRED    Comment: from [place] Use  . Smoking status: Former Smoker    Packs/day: 0.50    Years: 42.00    Pack years: 21.00     Types: Cigarettes    Quit date: 11/17/1998    Years since quitting: 21.8  . Smokeless tobacco: Never Used  Vaping Use  . Vaping Use: Never used  Substance and Sexual Activity  . Alcohol use: No  . Drug use: No  . Sexual activity: Yes  Other Topics Concern  . Not on file  Social History Narrative   Married 35 years (2nd marriage). 2 kids from previous marriage (lost a 53rd child hit by car at age 70). 2 stepchildren. 6 grandkids. 2 greatgrandkids.       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 Difficulty of Paying Living Expenses: Not hard at all  Food Insecurity: No Food Insecurity  . Worried About Charity fundraiser in the Last Year: Never true  . Ran Out of Food in the Last Year: Never true  Transportation Needs: No Transportation Needs  . Lack of Transportation (Medical): No  . Lack of Transportation (Non-Medical): No  Physical Activity: Inactive  . Days of Exercise per Week: 0 days  . Minutes of Exercise per Session: 0 min  Stress: No Stress Concern Present  . Feeling of Stress : Not at all  Social Connections: Moderately Isolated  . Frequency of Communication with Friends and Family: More than three times a week  . Frequency of Social Gatherings with Friends and Family: Three times a week  . Attends Religious Services: Never  . Active Member of Clubs or Organizations: No  . Attends Archivist Meetings: Never  . Marital Status: Married    Tobacco Counseling Counseling given: Not Answered   Clinical Intake:  Pre-visit preparation  completed: Yes  Pain : No/denies pain     BMI - recorded: 25.34 Nutritional Status: BMI 25 -29 Overweight Nutritional Risks: None Diabetes: No  How often do you need to have someone help you when you read instructions, pamphlets, or other written materials from your doctor or pharmacy?: 1 - Never  Diabetic?No  Interpreter Needed?: No  Information entered by :: Charlott Rakes, LPN   Activities of Daily Living In your present state of health, do you have any difficulty performing the following activities: 09/27/2020 12/31/2019  Hearing? Y N  Vision? N Y  Difficulty concentrating or making decisions? N N  Walking or climbing stairs? N N  Dressing or bathing? N N  Doing errands, shopping? N N  Preparing Food and eating ? N -  Using the Toilet? N -  In the past six months, have you accidently leaked urine? N -  Do you have problems with loss of bowel control? N -  Managing your Medications? N -  Managing your Finances? N -  Housekeeping or managing your Housekeeping? N -  Some recent data might be hidden    Patient Care Team: [name], MD as PCP - General (Family Medicine) Belva Crome, MD as PCP - Cardiology (Cardiology) Bjorn Loser, MD as Consulting Physician (Urology) Alanda Slim, Neena Rhymes, MD as Consulting Physician (Ophthalmology) Renato Shin, MD as Consulting Physician (Endocrinology) Leonie Man, MD as Consulting Physician (Cardiology)  Indicate any recent Medical Services you may have received from other than [facility] providers in the past year (date may be approximate).     Assessment:   This is a routine wellness examination for Emmanuel.  Hearing/Vision screen  Hearing Screening   125Hz  250Hz  500Hz  1000Hz  2000Hz  3000Hz  4000Hz  6000Hz  8000Hz   Right ear:           Left ear:           Comments: Mild loss   Vision Screening Comments: Dr Alanda Slim follow up with eye exams  Dietary issues and exercise activities discussed: Current Exercise  Habits: The patient does not participate in regular exercise at present (always doing yard work)  Goals    . Exercise 150 min/wk Moderate Activity     Increase physical activity     . Patient Stated     None  at this time      Depression Screen PHQ 2/9 Scores 09/27/2020 02/24/2020 01/11/2020 08/26/2019 08/12/2019 04/28/2019 06/14/2018  PHQ - 2 Score 0 0 0 0 0 0 0    Fall Risk Fall Risk  09/27/2020 08/29/2020 08/26/2019 08/12/2019 06/14/2018  Falls in the past year? 0 0 0 0 No  Number falls in past yr: 0 0 0 0 -  Injury with Fall? 0 0 0 0 -  Risk for fall due to : Impaired vision - - - -  Follow up Falls prevention discussed - - Education provided;Falls prevention discussed;Falls evaluation completed -    Any stairs in or around the home? Yes  If so, are there any without handrails? Yes  Home free of loose throw rugs in walkways, pet beds, electrical cords, etc? Yes  Adequate lighting in your home to reduce risk of falls? Yes   ASSISTIVE DEVICES UTILIZED TO PREVENT FALLS:  Life alert? No  Use of a cane, walker or w/c? No  Grab bars in the bathroom? Yes  Shower chair or bench in shower? Yes  Elevated toilet seat or a handicapped toilet? Yes   TIMED UP AND GO:  Was the test performed? No .      Cognitive Function:Declines 6CIT  MMSE - Mini Mental State Exam 04/07/2017  Not completed: (No Data)     6CIT Screen 06/14/2018  What Year? 0 points  What month? 0 points  What time? 0 points  Count back from 20 0 points  Months in reverse 0 points    Immunizations Immunization History  Administered Date(s) Administered  . Fluad Quad(high Dose 65+) 07/28/2019, 08/22/2020  . Influenza Split 08/11/2011, 08/05/2012  . Influenza Whole 08/31/2007, 08/15/2008, 08/20/2009, 08/05/2010  . Influenza, High Dose Seasonal PF 07/28/2016, 09/10/2017, 08/13/2018  . Influenza,inj,Quad PF,6+ Mos 07/15/2013, 07/20/2014, 08/10/2015  . Influenza,inj,quad, With Preservative 08/17/2018  . PFIZER  SARS-COV-2 Vaccination 11/28/2019, 12/22/2019, 08/28/2020  . Pneumococcal Conjugate-13 11/04/2013  . Pneumococcal Polysaccharide-23 02/05/2015  . Td 03/18/2007  . Tdap 06/04/2018  . Zoster 04/16/2011  . Zoster Recombinat (Shingrix) 08/15/2019, 10/31/2019    TDAP status: Up to date   Flu Vaccine status: Up to date Done 08/22/20  Pneumococcal vaccine status: Up to date Covid-19 vaccine status: Completed vaccines  Qualifies for Shingles Vaccine? Yes   Zostavax completed Yes   Shingrix Completed?: Yes  Screening Tests Health Maintenance  Topic Date Due  . TETANUS/TDAP  06/04/2028  . INFLUENZA VACCINE  Completed  . COVID-19 Vaccine  Completed  . PNA vac Low Risk Adult  Completed    Health Maintenance  There are no preventive care reminders to display for this patient.  Colorectal cancer screening: No longer required.     Additional Screening:   Vision Screening: Recommended annual ophthalmology exams for early detection of glaucoma and other disorders of the eye. Is the patient up to date with their annual eye exam?  Yes  Who is the provider or what is the name of the office in which the patient attends annual eye exams? Dr Alanda Slim    Dental Screening: Recommended annual dental exams for proper oral hygiene  Community Resource Referral / Chronic Care Management: CRR required this visit?  No   CCM required this visit?  No      Plan:     I have personally reviewed and noted the following in the patient's chart:   . Medical and social history . Use of alcohol, tobacco or illicit drugs  . Current  medications and supplements . Functional ability and status . Nutritional status . Physical activity . Advanced directives . List of other physicians . Hospitalizations, surgeries, and ER visits in previous 12 months . Vitals . Screenings to include cognitive, depression, and falls . Referrals and appointments  In addition, I have reviewed and discussed with  patient certain preventive protocols, quality metrics, and best practice recommendations. A written personalized care plan for preventive services as well as general preventive health recommendations were provided to patient.     Willette Brace, LPN   [id number]   Nurse Notes: None

## 2020-10-01 ENCOUNTER — Other Ambulatory Visit: Payer: Self-pay

## 2020-10-01 ENCOUNTER — Encounter: Payer: Self-pay | Admitting: Endocrinology

## 2020-10-01 ENCOUNTER — Telehealth: Payer: Self-pay

## 2020-10-01 ENCOUNTER — Ambulatory Visit (INDEPENDENT_AMBULATORY_CARE_PROVIDER_SITE_OTHER): Payer: Medicare Other | Admitting: Endocrinology

## 2020-10-01 VITALS — BP 128/60 | HR 66 | Ht 76.0 in | Wt 198.0 lb

## 2020-10-01 DIAGNOSIS — E0590 Thyrotoxicosis, unspecified without thyrotoxic crisis or storm: Secondary | ICD-10-CM

## 2020-10-01 LAB — TSH: TSH: 4.62 u[IU]/mL — ABNORMAL HIGH (ref 0.35–4.50)

## 2020-10-01 LAB — T4, FREE: Free T4: 0.84 ng/dL (ref 0.60–1.60)

## 2020-10-01 MED ORDER — METHIMAZOLE 5 MG PO TABS: 2.5000 mg | ORAL_TABLET | ORAL | 3 refills | Status: DC

## 2020-10-01 NOTE — Progress Notes (Signed)
Subjective:    Patient ID: Timothy Gallagher, male    DOB: 20-Apr-1936, 84 y.o.   MRN: [id number]  HPI Pt returns for f/u of hyperthyroidism (dx'ed early 2019; TFT improved initially, but recurred in July, 2019, and was started on tapazole; he has never had thyroid imaging; he takes toprol for CAD, not thyroid; he declines RAI).  Since the tapazole was decreased, pt states he feels no different, and well in general.  Past Medical History:  Diagnosis Date  . ACNE ROSACEA 06/27/2009  . ACTINIC KERATOSIS, HEAD 04/18/2009  . Acute maxillary sinusitis 05/14/2010  . ALLERGIC RHINITIS 04/09/2007  . Anal fissure 04/07/2016  . B12 DEFICIENCY 06/07/2007  . BACK PAIN WITH RADICULOPATHY 04/24/2008  . Cancer ([place])    skin  . CHEST WALL PAIN, ACUTE 06/15/2009  . Chronic maxillary sinusitis 05/29/2008  . COLITIS 04/27/2009  . COLONIC POLYPS, HX OF 04/27/2009   tubular adenomas  . Coronary artery disease 05/12/2014   Cath 05/12/2014 w/ severe 3-vessel CAD and preserved LV function, EF 55%  . DERMATITIS, ATOPIC 10/12/2007  . DIVERTICULOSIS, COLON 04/27/2009  . ECCHYMOSES, SPONTANEOUS 06/27/2008  . Elevated sedimentation rate 05/02/2009  . ESOPHAGEAL STRICTURE 04/27/2009  . GASTRITIS, CHRONIC 04/27/2009  . HYPERLIPIDEMIA 03/13/2008  . HYPERTENSION 04/09/2007  . Internal bleeding hemorrhoids 01/22/2015   01/22/2015 Seen at anoscopy, grade 1 all 3 positions   . Irritable bowel syndrome 08/11/2007  . KIDNEY DISEASE 04/09/2007  . NECK PAIN 09/14/2008  . NEUROPATHY, IDIOPATHIC PERIPHERAL NEC 08/11/2007  . OSTEOARTHRITIS, WRIST, RIGHT 08/05/2010  . Postoperative delirium 05/20/2014  . S/P CABG x 4 05/19/2014   LIMA to LAD, SVG to diag, SVG to OM, SVG to PDA, EVH via right thigh and leg    Past Surgical History:  Procedure Laterality Date  . CARDIAC CATHETERIZATION    . COLONOSCOPY    . CORONARY ARTERY BYPASS GRAFT N/A 05/19/2014   Procedure: CORONARY ARTERY BYPASS GRAFTING (CABG);  Surgeon: Rexene Alberts, MD;  Location: [facility];  Service: Open Heart Surgery;  Laterality: N/A;  Times 4 using left internal mammary artery and endoscopically harvested right saphenous vein  . ESOPHAGOGASTRODUODENOSCOPY    . FINGER SURGERY     cut off end of finger  . FLEXIBLE SIGMOIDOSCOPY    . HEMORRHOID BANDING    . HERNIA REPAIR    . INCISION AND DRAINAGE WOUND WITH FOREIGN BODY REMOVAL Left 12/20/2013   Procedure: INCISION AND DRAINAGE LEFT INDEX FINGER;  Surgeon: Tennis Must, MD;  Location: [facility];  Service: Orthopedics;  Laterality: Left;  . INTRAOPERATIVE TRANSESOPHAGEAL ECHOCARDIOGRAM N/A 05/19/2014   Procedure: INTRAOPERATIVE TRANSESOPHAGEAL ECHOCARDIOGRAM;  Surgeon: Rexene Alberts, MD;  Location: [facility];  Service: Open Heart Surgery;  Laterality: N/A;  . lamenectomy    . LEFT HEART CATHETERIZATION WITH CORONARY ANGIOGRAM N/A 05/12/2014   Procedure: LEFT HEART CATHETERIZATION WITH CORONARY ANGIOGRAM;  Surgeon: Sinclair Grooms, MD;  Location: [facility] CATH [facility];  Service: Cardiovascular;  Laterality: N/A;  . LUMBAR FUSION    . TONSILLECTOMY    . VARICOSE VEIN SURGERY Left     Social History   Socioeconomic History  . Marital status: Married    Spouse name: Not on file  . Number of children: 4  . Years of education: Not on file  . Highest education level: Not on file  Occupational History  . Occupation: retired    Fish farm manager: RETIRED    Comment: from [place] Use  . Smoking  status: Former Smoker    Packs/day: 0.50    Years: 42.00    Pack years: 21.00    Types: Cigarettes    Quit date: 11/17/1998    Years since quitting: 21.8  . Smokeless tobacco: Never Used  Vaping Use  . Vaping Use: Never used  Substance and Sexual Activity  . Alcohol use: No  . Drug use: No  . Sexual activity: Yes  Other Topics Concern  . Not on file  Social History Narrative   Married 35 years (2nd marriage). 2 kids from previous marriage (lost a 35rd child hit by car at age 58). 2 stepchildren. 6 grandkids. 2 greatgrandkids.        Retired from [employer]: walking/exercise, building in shop-furniture (cut finger off in February doing this)   6 grand children - Has bought them all a new car    Brother died of probably addiction   Sister also died; not sure of cause    He enjoys his life; Likes to play the keyboard; guitar    Had played in a gospel quartet    Enjoys working with wood    Social Determinants of Radio broadcast assistant Strain: Low Risk   . Difficulty of Paying Living Expenses: Not hard at all  Food Insecurity: No Food Insecurity  . Worried About Charity fundraiser in the Last Year: Never true  . Ran Out of Food in the Last Year: Never true  Transportation Needs: No Transportation Needs  . Lack of Transportation (Medical): No  . Lack of Transportation (Non-Medical): No  Physical Activity: Inactive  . Days of Exercise per Week: 0 days  . Minutes of Exercise per Session: 0 min  Stress: No Stress Concern Present  . Feeling of Stress : Not at all  Social Connections: Moderately Isolated  . Frequency of Communication with Friends and Family: More than three times a week  . Frequency of Social Gatherings with Friends and Family: Three times a week  . Attends Religious Services: Never  . Active Member of Clubs or Organizations: No  . Attends Archivist Meetings: Never  . Marital Status: Married  Human resources officer Violence: Not At Risk  . Fear of Current or Ex-Partner: No  . Emotionally Abused: No  . Physically Abused: No  . Sexually Abused: No    Current Outpatient Medications on File Prior to Visit  Medication Sig Dispense Refill  . acetaminophen (TYLENOL) 500 MG tablet Take 500 mg by mouth daily as needed (for pain). Reported on 04/11/2016    . aspirin 81 MG EC tablet Take 1 tablet (81 mg total) by mouth daily.    [name] Kitchen atorvastatin (LIPITOR) 20 MG tablet TAKE 1 TABLET(20 MG) BY MOUTH DAILY 90 tablet 3  . Multiple Vitamins-Minerals (CENTRUM SILVER PO) Take  1 tablet by mouth daily.    . tamsulosin (FLOMAX) 0.4 MG CAPS capsule Take 0.4 mg by mouth. Prescribed by urology     No current facility-administered medications on file prior to visit.    Allergies  Allergen Reactions  . Lipitor [Atorvastatin] Other (See Comments)    REACTION: nausea and blurred vision  . Trazodone And Nefazodone Other (See Comments)    dizzy  . Ciprofloxacin Swelling  . Mycophenolate Mofetil Other (See Comments)    REACTION: unspecified  . Amoxicillin Rash    Has patient had a PCN reaction causing immediate rash, facial/tongue/throat swelling, SOB or lightheadedness with hypotension: Unknown  Has patient had a PCN reaction causing severe rash involving mucus membranes or skin necrosis: Unknown Has patient had a PCN reaction that required hospitalization: Unknown Has patient had a PCN reaction occurring within the last 10 years: Unknown If all of the above answers are "NO", then may proceed with Cephalosporin use.   [name] Kitchen Penicillins Rash    Has patient had a PCN reaction causing immediate rash, facial/tongue/throat swelling, SOB or lightheadedness with hypotension: Unknown Has patient had a PCN reaction causing severe rash involving mucus membranes or skin necrosis: Unknown Has patient had a PCN reaction that required hospitalization: Unknown Has patient had a PCN reaction occurring within the last 10 years: Unknown If all of the above answers are "NO", then may proceed with Cephalosporin use.   . Rosuvastatin Other (See Comments)    Unknown     Family History  Problem Relation Age of Onset  . Hernia Mother   . Heart disease Father        smoker  . COPD Father   . Heart attack Brother   . Early death Daughter   . Colon cancer Neg Hx     BP 128/60   Pulse 66   Ht 6\' 4"  (1.93 m)   Wt 198 lb (89.8 kg)   SpO2 98%   BMI 24.10 kg/m    Review of Systems Denies fever    Objective:   Physical Exam VITAL SIGNS:  See vs page GENERAL: no distress NECK:  There is no palpable thyroid enlargement.  No thyroid nodule is palpable.  No palpable lymphadenopathy at the anterior neck.   Lab Results  Component Value Date   TSH 4.62 (H) 10/01/2020      Assessment & Plan:  Hyperthyroidism, overcontrolled, due to tapazole.  I have sent a prescription to your pharmacy, to reduce it. Please come back for a follow-up appointment in 2 months.

## 2020-10-01 NOTE — Patient Instructions (Addendum)
B12 deficiency- team please give injection today  sounds like orthostatic hypotension- lightheadedness with change in position. tamsulosin from urology likely contributes but with prior urinary infections being so serious do not think we can stop this. We discussed increasing hydration perhaps add an extra glass of water each day. Try to change positions slowly and hold onto something to avoid falls.   Keep February visit but keep getting monthly b12 shots- schedule before you leave

## 2020-10-01 NOTE — Patient Instructions (Addendum)
Blood tests are requested for you today.  We'll let you know about the results.  If ever you have fever while taking methimazole, stop it and call us, even if the reason is obvious, because of the risk of a rare side-effect. It is best to never miss the medication.  However, if you do miss it, next best is to double up the next time.   Please come back for a follow-up appointment in 6 months.

## 2020-10-01 NOTE — Telephone Encounter (Signed)
Please schedule pt for Wednesday at 8:40 as he has been experiencing dizzy spells. . Add b12 to visit.

## 2020-10-01 NOTE — Progress Notes (Signed)
Phone [phone] In person visit   Subjective:   Timothy Gallagher is a 84 y.o. year old very pleasant male patient who presents for/with See problem oriented charting Chief Complaint  Patient presents with  . Dizziness   This visit occurred during the SARS-CoV-2 public health emergency.  Safety protocols were in place, including screening questions prior to the visit, additional usage of staff PPE, and extensive cleaning of exam room while observing appropriate contact time as indicated for disinfecting solutions.   Past Medical History-  Patient Active Problem List   Diagnosis Date Noted  . Hyperthyroidism 01/13/2018    Priority: High  . Detached retina, left 06/30/2017    Priority: High  . AAA (abdominal aortic aneurysm) ([place]) 05/12/2017    Priority: High  . CAD (coronary artery disease) of artery bypass graft 05/19/2014    Priority: High  . History of atrial fibrillation 08/26/2019    Priority: Medium  . BPPV (benign paroxysmal positional vertigo) 09/01/2016    Priority: Medium  . Insomnia 11/22/2014    Priority: Medium  . Anemia 07/20/2014    Priority: Medium  . BPH (benign prostatic hyperplasia) 12/05/2013    Priority: Medium  . Hyperlipidemia 03/13/2008    Priority: Medium  . B12 deficiency 06/07/2007    Priority: Medium  . Essential hypertension 04/09/2007    Priority: Medium  . Chronic obstructive pulmonary disease ([place]) 08/26/2019    Priority: Low  . Left sided abdominal pain 04/15/2017    Priority: Low  . Low back pain 02/06/2017    Priority: Low  . Anal fissure 04/07/2016    Priority: Low  . Internal and external bleeding hemorrhoids 01/22/2015    Priority: Low  . Palpitations 02/11/2013    Priority: Low  . OSTEOARTHRITIS, WRIST, RIGHT 08/05/2010    Priority: Low  . ACNE ROSACEA 06/27/2009    Priority: Low  . ESOPHAGEAL STRICTURE 04/27/2009    Priority: Low  . ACTINIC KERATOSIS, HEAD 04/18/2009    Priority: Low  . NECK PAIN 09/14/2008     Priority: Low  . NEUROPATHY, IDIOPATHIC PERIPHERAL NEC 08/11/2007    Priority: Low  . Irritable bowel syndrome 08/11/2007    Priority: Low  . ALLERGIC RHINITIS 04/09/2007    Priority: Low    Medications- reviewed and updated Current Outpatient Medications  Medication Sig Dispense Refill  . acetaminophen (TYLENOL) 500 MG tablet Take 500 mg by mouth daily as needed (for pain). Reported on 04/11/2016    . aspirin 81 MG EC tablet Take 1 tablet (81 mg total) by mouth daily.    [name] Kitchen atorvastatin (LIPITOR) 20 MG tablet TAKE 1 TABLET(20 MG) BY MOUTH DAILY 90 tablet 3  . methimazole (TAPAZOLE) 5 MG tablet Take 0.5 tablets (2.5 mg total) by mouth 3 (three) times a week. 20 tablet 3  . Multiple Vitamins-Minerals (CENTRUM SILVER PO) Take 1 tablet by mouth daily.    . tamsulosin (FLOMAX) 0.4 MG CAPS capsule Take 0.4 mg by mouth. Prescribed by urology     No current facility-administered medications for this visit.     Objective:  BP 128/78   Pulse 76   Temp 97.9 F (36.6 C) (Temporal)   Resp 18   Ht 6\' 2"  (1.88 m)   Wt 197 lb 9.6 oz (89.6 kg)   SpO2 97%   BMI 25.37 kg/m  Gen: NAD, resting comfortably CV: RRR no murmurs rubs or gallops Lungs: CTAB no crackles, wheeze, rhonchi Ext: no edema Skin: warm, dry Neuro: grossly normal, moves  all extremities      Assessment and Plan  # intermittent Dizziness with changing from seated to standing position S:Patient mentioned that he had a dizzy spell yesterday when he got off the couch. No dizziness at present. Gets these every so often. Episodes last a few seconds. Drinks probably 2 glasses of water a day possibly more. Happens perhaps 10-12 x a week.  No ches tpain or shortness of breath with episodes. No heart racing or skipping a beat  Not having symptoms today but BP went from 132/72 seated to 122/62 standing- diastolic would be within orthostatic range.  A/P: sounds like orthostatic hypotension- lightheadedness with change in position.  tamsulosin from urology likely contributes but with prior urinary infections being so serious do not think we can stop this. We discussed increasing hydration perhaps add an extra glass of water each day. Try to change positions slowly and hold onto something to avoid falls.   # B12 deficiency S: Current treatment/medication (oral vs. IM): monthly injections  Lab Results  Component Value Date   [id number] 505 08/06/2020  A/P: good control last check- update b12 with labs today   Recommended follow up:  Keep February visit Future Appointments  Date Time Provider [place]  01/01/2021 11:20 AM [name], MD [facility]  03/26/2021 10:00 AM Renato Shin, MD [facility] None  10/03/2021  1:45 PM [facility] HEALTH COACH [facility]    Lab/Order associations:   ICD-10-CM   1. Orthostatic hypotension  I95.1   2. B12 deficiency  E53.8    Return precautions advised.  [name], MD

## 2020-10-01 NOTE — Telephone Encounter (Signed)
Patient has been scheduled

## 2020-10-03 ENCOUNTER — Other Ambulatory Visit: Payer: Self-pay

## 2020-10-03 ENCOUNTER — Ambulatory Visit: Payer: Medicare Other

## 2020-10-03 ENCOUNTER — Ambulatory Visit (INDEPENDENT_AMBULATORY_CARE_PROVIDER_SITE_OTHER): Payer: Medicare Other | Admitting: Family Medicine

## 2020-10-03 ENCOUNTER — Encounter: Payer: Self-pay | Admitting: Family Medicine

## 2020-10-03 VITALS — BP 128/78 | HR 76 | Temp 97.9°F | Resp 18 | Ht 74.0 in | Wt 197.6 lb

## 2020-10-03 DIAGNOSIS — E538 Deficiency of other specified B group vitamins: Secondary | ICD-10-CM | POA: Diagnosis not present

## 2020-10-03 DIAGNOSIS — I951 Orthostatic hypotension: Secondary | ICD-10-CM

## 2020-10-03 MED ORDER — CYANOCOBALAMIN 1000 MCG/ML IJ SOLN
1000.0000 ug | Freq: Once | INTRAMUSCULAR | Status: AC
  Administered 2020-10-03: 1000 ug via INTRAMUSCULAR

## 2020-10-03 NOTE — Progress Notes (Signed)
Outbound call to patient advising reduction of Methimazole to 3 times a week and to come back for a follow up appointment in 2 months. Patient verbalized understanding.

## 2020-10-03 NOTE — Addendum Note (Signed)
Addended by: Thomes Cake on: 10/03/2020 09:21 AM   Modules accepted: Orders

## 2020-11-01 ENCOUNTER — Ambulatory Visit: Payer: Medicare Other

## 2020-11-01 ENCOUNTER — Ambulatory Visit (INDEPENDENT_AMBULATORY_CARE_PROVIDER_SITE_OTHER): Payer: Medicare Other | Admitting: Family Medicine

## 2020-11-01 ENCOUNTER — Encounter: Payer: Self-pay | Admitting: Family Medicine

## 2020-11-01 ENCOUNTER — Other Ambulatory Visit: Payer: Self-pay

## 2020-11-01 VITALS — BP 136/86 | HR 75 | Temp 98.5°F | Ht 74.0 in | Wt 197.4 lb

## 2020-11-01 DIAGNOSIS — I951 Orthostatic hypotension: Secondary | ICD-10-CM | POA: Diagnosis not present

## 2020-11-01 DIAGNOSIS — R413 Other amnesia: Secondary | ICD-10-CM | POA: Diagnosis not present

## 2020-11-01 DIAGNOSIS — E538 Deficiency of other specified B group vitamins: Secondary | ICD-10-CM | POA: Diagnosis not present

## 2020-11-01 DIAGNOSIS — I10 Essential (primary) hypertension: Secondary | ICD-10-CM | POA: Diagnosis not present

## 2020-11-01 MED ORDER — CYANOCOBALAMIN 1000 MCG/ML IJ SOLN
1000.0000 ug | Freq: Once | INTRAMUSCULAR | Status: AC
  Administered 2020-11-01: 1000 ug via INTRAMUSCULAR

## 2020-11-01 NOTE — Patient Instructions (Addendum)
b12 injection today  Sorry to hear about dizziness- once again the flomax is likely contributing but we need to continue this. I want you to make usre to remain well hydrated and rise slowly to help avoid worsening issues. We discussed potential neuroimaging like MRI but you wanted to hold off for now- I think that's reasonable since this is likely position change related.   Slight memory loss noted- we have referred you to neurology in the past but when they call you do not end up going- I want you to get family opinion on if they are noticing any changes- if they are please reconsider and let me refer you again to neurology  Recommended follow up: Schedule b12 in a month and then 2 months and I will see you in February unless you need [place] sooner

## 2020-11-01 NOTE — Progress Notes (Signed)
Phone [phone] In person visit   Subjective:   Timothy Gallagher is a 84 y.o. year old very pleasant male patient who presents for/with See problem oriented charting Chief Complaint  Patient presents with  . Dizziness    Intermittent dizziness for about a month   . B12 Injection   This visit occurred during the SARS-CoV-2 public health emergency.  Safety protocols were in place, including screening questions prior to the visit, additional usage of staff PPE, and extensive cleaning of exam room while observing appropriate contact time as indicated for disinfecting solutions.   Past Medical History-  Patient Active Problem List   Diagnosis Date Noted  . Hyperthyroidism 01/13/2018    Priority: High  . Detached retina, left 06/30/2017    Priority: High  . AAA (abdominal aortic aneurysm) ([place]) 05/12/2017    Priority: High  . CAD (coronary artery disease) of artery bypass graft 05/19/2014    Priority: High  . Memory loss 11/01/2020    Priority: Medium  . History of atrial fibrillation 08/26/2019    Priority: Medium  . BPPV (benign paroxysmal positional vertigo) 09/01/2016    Priority: Medium  . Insomnia 11/22/2014    Priority: Medium  . Anemia 07/20/2014    Priority: Medium  . BPH (benign prostatic hyperplasia) 12/05/2013    Priority: Medium  . Hyperlipidemia 03/13/2008    Priority: Medium  . B12 deficiency 06/07/2007    Priority: Medium  . Essential hypertension 04/09/2007    Priority: Medium  . Chronic obstructive pulmonary disease ([place]) 08/26/2019    Priority: Low  . Left sided abdominal pain 04/15/2017    Priority: Low  . Low back pain 02/06/2017    Priority: Low  . Anal fissure 04/07/2016    Priority: Low  . Internal and external bleeding hemorrhoids 01/22/2015    Priority: Low  . Palpitations 02/11/2013    Priority: Low  . OSTEOARTHRITIS, WRIST, RIGHT 08/05/2010    Priority: Low  . ACNE ROSACEA 06/27/2009    Priority: Low  . ESOPHAGEAL STRICTURE  04/27/2009    Priority: Low  . ACTINIC KERATOSIS, HEAD 04/18/2009    Priority: Low  . NECK PAIN 09/14/2008    Priority: Low  . NEUROPATHY, IDIOPATHIC PERIPHERAL NEC 08/11/2007    Priority: Low  . Irritable bowel syndrome 08/11/2007    Priority: Low  . ALLERGIC RHINITIS 04/09/2007    Priority: Low    Medications- reviewed and updated Current Outpatient Medications  Medication Sig Dispense Refill  . acetaminophen (TYLENOL) 500 MG tablet Take 500 mg by mouth daily as needed (for pain). Reported on 04/11/2016    . aspirin 81 MG EC tablet Take 1 tablet (81 mg total) by mouth daily.    [name] Kitchen atorvastatin (LIPITOR) 20 MG tablet TAKE 1 TABLET(20 MG) BY MOUTH DAILY 90 tablet 3  . methimazole (TAPAZOLE) 5 MG tablet Take 0.5 tablets (2.5 mg total) by mouth 3 (three) times a week. 20 tablet 3  . Multiple Vitamins-Minerals (CENTRUM SILVER PO) Take 1 tablet by mouth daily.    . tamsulosin (FLOMAX) 0.4 MG CAPS capsule Take 0.4 mg by mouth. Prescribed by urology     No current facility-administered medications for this visit.     Objective:  BP 136/86   Pulse 75   Temp 98.5 F (36.9 C) (Temporal)   Ht 6\' 2"  (1.88 m)   Wt 197 lb 6.4 oz (89.5 kg)   SpO2 98%   BMI 25.34 kg/m  Gen: NAD, resting comfortably CV: RRR no  murmurs rubs or gallops Lungs: CTAB no crackles, wheeze, rhonchi Ext: no edema Skin: warm, dry     Assessment and Plan   # B12 deficiency S: Current treatment/medication (oral vs. IM): b12 injections monthly  Lab Results  Component Value Date   [id number] 321 08/06/2020  A/P: B12 is trending down with once every 5-week injections and we wanted to make sure did not trend down lower -we are back on monthly schedule-consider repeating B12 with next blood work  #hypertension/orthostatic intolerance S: medication: controlled off meds  Patient reports continued issues with lightheadedness when getting up from breakfast in the morning-seems to be short-lived but it does bother  him.  Feels like once he is active during the day is not having any issues.  We discussed how Flomax plays a role  BP Readings from Last 3 Encounters:  11/01/20 136/86  10/03/20 128/78  10/01/20 128/60  A/P: Hypertension well-controlled without medication.  Patient does have orthostatic intolerance-recommended staying hydrated and changing positions slowly.  We discussed how Flomax likely contributes but we need to continue that medication due to history of cystitis issue leading to hospitalization - with dizzy spells since positional do not think cardiac monitor helpful. Doubt recurrent a fib but discussed with patient could ask cardiology opinion next visit.  -also with positional do on think MRI very helpful (he also declines this) -hyperthyroidism stable and not likely the cause of symptoms   #Memory concerns- patient denies any concerns. Patient with 10th grade education. MMSE Today 24/30 and in September 25/30 (did not place in flow chart)so essentially stable. I am concerned about lack of being able to do 3 word recall and encouraged neurology visit which he declines.  MMSE - Mini Mental State Exam 11/01/2020 11/01/2020 04/07/2017  Not completed: - - (No Data)  Orientation to time 3 3 -  Orientation to Place 4 4 -  Registration 3 3 -  Attention/ Calculation 5 5 -  Recall 0 0 -  Language- name 2 objects 2 2 -  Language- repeat 1 1 -  Language- follow 3 step command 3 3 -  Language- read & follow direction 1 1 -  Write a sentence 1 1 -  Copy design 1 1 -  Total score 24 24 -  *duplicate readings from today  Recommended follow up: 1 month b12 and then see me in 2 months as scheduled Future Appointments  Date Time Provider [place]  11/13/2020  2:45 PM [place], [place] [facility]  12/05/2020  9:00 AM [facility] NURSE [facility]  01/01/2021 11:20 AM [name], MD [facility]  03/26/2021 10:00 AM Renato Shin, MD [facility] None  10/03/2021  1:45 PM  [facility] HEALTH COACH [facility]    Lab/Order associations:   ICD-10-CM   1. B12 deficiency  E53.8 cyanocobalamin ((VITAMIN B-12)) injection 1,000 mcg  2. Essential hypertension  I10   3. Orthostatic hypotension  I95.1   4. Memory loss  R41.3     Meds ordered this encounter  Medications  . cyanocobalamin ((VITAMIN B-12)) injection 1,000 mcg      Return precautions advised.  [name], MD

## 2020-11-01 NOTE — Assessment & Plan Note (Signed)
#  Memory concerns- patient denies any concerns. Patient with 10th grade education. MMSE Today 24/30 and in September 25/30 (did not place in flow chart)so essentially stable. I am concerned about lack of being able to do 3 word recall and encouraged neurology visit which he declines.  MMSE - Mini Mental State Exam 11/01/2020 11/01/2020 04/07/2017  Not completed: - - (No Data)  Orientation to time 3 3 -  Orientation to Place 4 4 -  Registration 3 3 -  Attention/ Calculation 5 5 -  Recall 0 0 -  Language- name 2 objects 2 2 -  Language- repeat 1 1 -  Language- follow 3 step command 3 3 -  Language- read & follow direction 1 1 -  Write a sentence 1 1 -  Copy design 1 1 -  Total score 24 24 -  *duplicate readings from today

## 2020-11-13 ENCOUNTER — Ambulatory Visit: Payer: Medicare Other | Admitting: Podiatry

## 2020-11-20 ENCOUNTER — Other Ambulatory Visit: Payer: Self-pay

## 2020-11-20 ENCOUNTER — Ambulatory Visit (INDEPENDENT_AMBULATORY_CARE_PROVIDER_SITE_OTHER): Payer: Medicare Other

## 2020-11-20 ENCOUNTER — Ambulatory Visit (INDEPENDENT_AMBULATORY_CARE_PROVIDER_SITE_OTHER): Payer: Medicare Other | Admitting: Podiatry

## 2020-11-20 ENCOUNTER — Other Ambulatory Visit: Payer: Self-pay | Admitting: Podiatry

## 2020-11-20 DIAGNOSIS — M778 Other enthesopathies, not elsewhere classified: Secondary | ICD-10-CM | POA: Diagnosis not present

## 2020-11-20 DIAGNOSIS — M79671 Pain in right foot: Secondary | ICD-10-CM

## 2020-11-20 NOTE — Progress Notes (Signed)
He presents today chief complaint of pain across the dorsal aspect of the right foot.  He states that it bothers me so much that can hardly sleep at nights and if I roll a particular way or step a particular way that is very painful.  He also relates that some shoe gear is worse than others.  He denies any changes in his past medical history medications and allergies he does relate an injury to the fifth digit that he thinks may have contributed to the cause of the pain to the dorsal aspect of the right foot.  Objective: Vital signs are stable he is alert and oriented x3 pulses are palpable.  Neurologic sensorium is intact degenerative flexors are intact muscle strength is normal symmetrical.  He has a large nonpulsatile mass to the dorsal aspect of the right foot just beneath the dorsalis pedis of the deep peroneal nerve.  This area is exquisitely tender for him and painful on palpation.  Radiographs taken today do demonstrate osteoarthritic changes of the midfoot particularly the second and third tarsometatarsal joints.  Assessment: Pain in limb secondary to capsulitis osteoarthritis and peroneal neuritis dorsal aspect of the right foot.  Plan: At this point we injected the dorsal aspect of the right foot 20 mg Kenalog 5 mg Marcaine across the dorsal aspect of the foot with local anesthetic.  He tolerated procedure well without complications we will notify me with questions or concerns or as to whether or not he needs to return.

## 2020-11-29 ENCOUNTER — Telehealth: Payer: Self-pay

## 2020-11-29 ENCOUNTER — Other Ambulatory Visit: Payer: Self-pay

## 2020-11-29 MED ORDER — ATORVASTATIN CALCIUM 20 MG PO TABS
ORAL_TABLET | ORAL | 3 refills | Status: DC
Start: 2020-11-29 — End: 2021-05-16

## 2020-11-29 NOTE — Telephone Encounter (Signed)
  LAST APPOINTMENT DATE: 11/20/2020   NEXT APPOINTMENT DATE:@1 /19/2022  MEDICATION: atorvastatin (LIPITOR) 20 MG tablet  PHARMACY:[id number] - [place], [place] - [address] AT [address]  COMMENTS: Patient is completely out and states the pharmacy has faxed over something several times.

## 2020-11-29 NOTE — Telephone Encounter (Signed)
Medication has been refilled and sent to the pharmacy  

## 2020-12-05 ENCOUNTER — Ambulatory Visit: Payer: Medicare Other

## 2020-12-11 ENCOUNTER — Ambulatory Visit (INDEPENDENT_AMBULATORY_CARE_PROVIDER_SITE_OTHER): Payer: Medicare Other

## 2020-12-11 ENCOUNTER — Other Ambulatory Visit: Payer: Self-pay

## 2020-12-11 DIAGNOSIS — E538 Deficiency of other specified B group vitamins: Secondary | ICD-10-CM | POA: Diagnosis not present

## 2020-12-11 MED ORDER — CYANOCOBALAMIN 1000 MCG/ML IJ SOLN
1000.0000 ug | Freq: Once | INTRAMUSCULAR | Status: AC
  Administered 2020-12-11: 1000 ug via INTRAMUSCULAR

## 2020-12-11 NOTE — Progress Notes (Signed)
Per orders of [name], injection of B-12 given by Tobe Sos in left deltoid. Patient tolerated injection well. Patient will make appointment for 5 week.

## 2020-12-31 NOTE — Progress Notes (Signed)
Phone [phone] In person visit   Subjective:   Timothy Gallagher is a 85 y.o. year old very pleasant male patient who presents for/with See problem oriented charting Chief Complaint  Patient presents with  . Hypertension   This visit occurred during the SARS-CoV-2 public health emergency.  Safety protocols were in place, including screening questions prior to the visit, additional usage of staff PPE, and extensive cleaning of exam room while observing appropriate contact time as indicated for disinfecting solutions.   Past Medical History-  Patient Active Problem List   Diagnosis Date Noted  . Hyperthyroidism 01/13/2018    Priority: High  . Detached retina, left 06/30/2017    Priority: High  . AAA (abdominal aortic aneurysm) ([place]) 05/12/2017    Priority: High  . CAD (coronary artery disease) of artery bypass graft 05/19/2014    Priority: High  . Memory loss 11/01/2020    Priority: Medium  . History of atrial fibrillation 08/26/2019    Priority: Medium  . BPPV (benign paroxysmal positional vertigo) 09/01/2016    Priority: Medium  . Insomnia 11/22/2014    Priority: Medium  . Anemia 07/20/2014    Priority: Medium  . BPH (benign prostatic hyperplasia) 12/05/2013    Priority: Medium  . Hyperlipidemia 03/13/2008    Priority: Medium  . B12 deficiency 06/07/2007    Priority: Medium  . Essential hypertension 04/09/2007    Priority: Medium  . Chronic obstructive pulmonary disease ([place]) 08/26/2019    Priority: Low  . Left sided abdominal pain 04/15/2017    Priority: Low  . Low back pain 02/06/2017    Priority: Low  . Anal fissure 04/07/2016    Priority: Low  . Internal and external bleeding hemorrhoids 01/22/2015    Priority: Low  . Palpitations 02/11/2013    Priority: Low  . OSTEOARTHRITIS, WRIST, RIGHT 08/05/2010    Priority: Low  . ACNE ROSACEA 06/27/2009    Priority: Low  . ESOPHAGEAL STRICTURE 04/27/2009    Priority: Low  . ACTINIC KERATOSIS, HEAD 04/18/2009     Priority: Low  . NECK PAIN 09/14/2008    Priority: Low  . NEUROPATHY, IDIOPATHIC PERIPHERAL NEC 08/11/2007    Priority: Low  . Irritable bowel syndrome 08/11/2007    Priority: Low  . ALLERGIC RHINITIS 04/09/2007    Priority: Low    Medications- reviewed and updated Current Outpatient Medications  Medication Sig Dispense Refill  . acetaminophen (TYLENOL) 500 MG tablet Take 500 mg by mouth daily as needed (for pain). Reported on 04/11/2016    . aspirin 81 MG EC tablet Take 1 tablet (81 mg total) by mouth daily.    [name] Kitchen atorvastatin (LIPITOR) 20 MG tablet TAKE 1 TABLET(20 MG) BY MOUTH DAILY 90 tablet 3  . methimazole (TAPAZOLE) 5 MG tablet Take 0.5 tablets (2.5 mg total) by mouth 3 (three) times a week. 20 tablet 3  . Multiple Vitamins-Minerals (CENTRUM SILVER PO) Take 1 tablet by mouth daily.    . tamsulosin (FLOMAX) 0.4 MG CAPS capsule Take 0.4 mg by mouth. Prescribed by urology     No current facility-administered medications for this visit.     Objective:  BP 138/82   Pulse 72   Temp 98.2 F (36.8 C) (Temporal)   Ht 6\' 2"  (1.88 m)   Wt 197 lb (89.4 kg)   SpO2 98%   BMI 25.29 kg/m  Gen: NAD, resting comfortably CV: RRR no murmurs rubs or gallops Lungs: CTAB no crackles, wheeze, rhonchi Ext: no edema Skin: warm, dry  Assessment and Plan   #CAD- with history of CABG in 2015 - follows with Dr. Daneen Schick #hyperlipidemia - LDL goal under 70 S: Medication:atorvastatin 20 mg, aspirin 81 mg  -no chest pain or shortness of breath   A/P: CAD asymptomatic- continue to monitor - HLD has been controlled with LDL under 70 too soon for repeat  %hyperthyroidism- follows with Dr. Loanne Drilling S: compliant On thyroid medication- methimazole 2.5 mg - now down to 3x a week Lab Results  Component Value Date   TSH 4.62 (H) 10/01/2020  A/P:with reduction we will update tsh and free t4 and forward to Dr. Loanne Drilling- it looked like he wanted at 2 month repeat and has now been 3 months.     #hypertension/orthostatic hypotension S: medication:  none -orthostatic intolerance on ARB and beta blocker in past- and BP has trended down. Still with some orthostatic issues on tamsulosin - still with intermittent dizziness with position changes A/P: stable/controlled and orthostatics have not been bad lately- continue to monitor  # AAA S:Stable 3.3 cm infrarenal AAA on 01/24/20  A/P: has been stable- looks like Dr. [name] scheduled this for march 2022  # BPH S:history of severe UTI leading to hospitalization. Orthostatic issues but feel we have to leave him on flomax  A/P: Stable. Continue current medications.    # B12 deficiency S: Current treatment/medication (oral vs. IM):  1029mcg injections montly Lab Results  Component Value Date   [id number] 824 08/06/2020  A/P: well controlled on last check- will update at least yearly   #history of left retina detachment- permanent loss of vision   # Hyperglycemia/insulin resistance/prediabetes- peak a1c of 6.3 S:  Medication: none Exercise and diet- trying to stay active. Discussed could reduce portion sizes A/P: hopefully has not progressed to diabetes- update a1c  #strained his right side a week ago-improving at this point  Recommended follow up: Return in about 4 months (around 05/01/2021) for physical or sooner if needed. Future Appointments  Date Time Provider [place]  01/16/2021  9:00 AM [facility] NURSE [facility] P[facility]  01/23/2021  9:00 AM [facility] NL VASC 4 [facility]  03/26/2021 10:00 AM Renato Shin, MD [facility] None  10/03/2021  1:45 PM [facility] HEALTH COACH [facility] PEC    Lab/Order associations:   ICD-10-CM   1. Essential hypertension  I10   2. Hyperthyroidism  E05.90 TSH    T4, free  3. Hyperlipidemia, unspecified hyperlipidemia type  E78.5 CBC with Differential/Platelet    Comprehensive metabolic panel  4. B12 deficiency  E53.8   5. Hyperglycemia  R73.9 Hemoglobin A1c  6. Coronary artery disease  involving coronary bypass graft of native heart without angina pectoris  I25.810   7. Abdominal aortic aneurysm (AAA) without rupture ([facility])  I71.4    Return precautions advised.  [name], MD

## 2020-12-31 NOTE — Patient Instructions (Addendum)
Schedule lab visit on 01/16/21 when you come by for your b12 injection  Tell the Mrs. I am sorry for keeping you so long today!   No changes today- make sure only taking half tablet of methimazole three times a week as instructed by Dr. Loanne Drilling  Keep getting b12 injections once a month  Recommended follow up: Return in about 4 months (around 05/01/2021) for physical or sooner if needed.

## 2021-01-01 ENCOUNTER — Ambulatory Visit (INDEPENDENT_AMBULATORY_CARE_PROVIDER_SITE_OTHER): Payer: Medicare Other | Admitting: Family Medicine

## 2021-01-01 ENCOUNTER — Other Ambulatory Visit: Payer: Self-pay

## 2021-01-01 ENCOUNTER — Encounter: Payer: Self-pay | Admitting: Family Medicine

## 2021-01-01 VITALS — BP 138/82 | HR 72 | Temp 98.2°F | Ht 74.0 in | Wt 197.0 lb

## 2021-01-01 DIAGNOSIS — E538 Deficiency of other specified B group vitamins: Secondary | ICD-10-CM

## 2021-01-01 DIAGNOSIS — E0590 Thyrotoxicosis, unspecified without thyrotoxic crisis or storm: Secondary | ICD-10-CM | POA: Diagnosis not present

## 2021-01-01 DIAGNOSIS — I10 Essential (primary) hypertension: Secondary | ICD-10-CM | POA: Diagnosis not present

## 2021-01-01 DIAGNOSIS — E785 Hyperlipidemia, unspecified: Secondary | ICD-10-CM | POA: Diagnosis not present

## 2021-01-01 DIAGNOSIS — R739 Hyperglycemia, unspecified: Secondary | ICD-10-CM

## 2021-01-01 DIAGNOSIS — I714 Abdominal aortic aneurysm, without rupture, unspecified: Secondary | ICD-10-CM

## 2021-01-01 DIAGNOSIS — I25810 Atherosclerosis of coronary artery bypass graft(s) without angina pectoris: Secondary | ICD-10-CM

## 2021-01-16 ENCOUNTER — Other Ambulatory Visit: Payer: Medicare Other

## 2021-01-16 ENCOUNTER — Ambulatory Visit: Payer: Medicare Other

## 2021-01-17 ENCOUNTER — Ambulatory Visit (INDEPENDENT_AMBULATORY_CARE_PROVIDER_SITE_OTHER): Payer: Medicare Other | Admitting: *Deleted

## 2021-01-17 ENCOUNTER — Encounter: Payer: Self-pay | Admitting: *Deleted

## 2021-01-17 DIAGNOSIS — E538 Deficiency of other specified B group vitamins: Secondary | ICD-10-CM

## 2021-01-17 MED ORDER — CYANOCOBALAMIN 1000 MCG/ML IJ SOLN
1000.0000 ug | Freq: Once | INTRAMUSCULAR | Status: AC
  Administered 2021-01-17: 1000 ug via INTRAMUSCULAR

## 2021-01-17 NOTE — Progress Notes (Signed)
Per orders of Dr. Hunter, injection of Cyanocobalamin 1000 mcg/ml given IM by Donna Orphanos, LPN in right deltoid. Patient tolerated injection well. Patient will make appointment for 1 month.   

## 2021-01-23 ENCOUNTER — Ambulatory Visit (HOSPITAL_COMMUNITY)
Admission: RE | Admit: 2021-01-23 | Discharge: 2021-01-23 | Disposition: A | Discharge status: Home / Self Care | Payer: Medicare Other | Source: Ambulatory Visit | Attending: Cardiology | Admitting: Cardiology

## 2021-01-23 ENCOUNTER — Other Ambulatory Visit: Payer: Self-pay

## 2021-01-23 ENCOUNTER — Other Ambulatory Visit (HOSPITAL_COMMUNITY): Payer: Self-pay | Admitting: Interventional Cardiology

## 2021-01-23 DIAGNOSIS — I714 Abdominal aortic aneurysm, without rupture, unspecified: Secondary | ICD-10-CM

## 2021-01-24 ENCOUNTER — Ambulatory Visit: Payer: Medicare Other | Admitting: Endocrinology

## 2021-02-14 ENCOUNTER — Ambulatory Visit: Payer: Medicare Other

## 2021-02-19 ENCOUNTER — Other Ambulatory Visit: Payer: Self-pay

## 2021-02-19 ENCOUNTER — Encounter: Payer: Self-pay | Admitting: Podiatry

## 2021-02-19 ENCOUNTER — Ambulatory Visit: Payer: Medicare Other | Admitting: Podiatry

## 2021-02-19 DIAGNOSIS — M722 Plantar fascial fibromatosis: Secondary | ICD-10-CM | POA: Diagnosis not present

## 2021-02-19 MED ORDER — TRIAMCINOLONE ACETONIDE 40 MG/ML IJ SUSP
40.0000 mg | Freq: Once | INTRAMUSCULAR | Status: AC
  Administered 2021-02-19: 40 mg

## 2021-02-19 NOTE — Progress Notes (Signed)
He presents today states that the top of the right foot is doing much better however his heels have been bothering him recently.  He does relate that sometimes the pain goes away if he lays on his side.  Objective: Vital signs are stable he is alert oriented x3.  Pulses are palpable.  He has a palpable nodular mass on plantar aspect of the bilateral heels with fat pad atrophy if feels like a bursa or bursitis.  He also has tenderness on palpation at the plantar fascial calcaneal insertion sites bilaterally.  Assessment: Resolving neuritis dorsal aspect of the right foot with plantar fasciitis and calcaneal bursitis plantar aspect of the bilateral foot.  Plan: At this point I went ahead and injected the bilateral heels 20 mg Kenalog 5 mg Marcaine to the point of maximal tenderness.  Tolerated procedure well without complications.  I will follow-up with him on an as-needed basis.

## 2021-02-21 ENCOUNTER — Other Ambulatory Visit: Payer: Self-pay

## 2021-02-21 ENCOUNTER — Ambulatory Visit (INDEPENDENT_AMBULATORY_CARE_PROVIDER_SITE_OTHER): Payer: Medicare Other

## 2021-02-21 DIAGNOSIS — E538 Deficiency of other specified B group vitamins: Secondary | ICD-10-CM

## 2021-02-21 MED ORDER — CYANOCOBALAMIN 1000 MCG/ML IJ SOLN
1000.0000 ug | Freq: Once | INTRAMUSCULAR | Status: AC
  Administered 2021-02-21: 1000 ug via INTRAMUSCULAR

## 2021-02-21 NOTE — Progress Notes (Signed)
Timothy Gallagher Mention 85 year old male presents in office today for B12 injection per [name], MD. Administered CYANOCOBALAMIN 1,048mcg IM left deltoid. Patient tolerated injection well, and is aware to return next month for next injection.

## 2021-03-04 NOTE — Progress Notes (Signed)
I have reviewed and agree with note, evaluation, plan.   Doaa Kendzierski, MD  

## 2021-03-19 ENCOUNTER — Encounter: Payer: Self-pay | Admitting: Podiatry

## 2021-03-19 ENCOUNTER — Ambulatory Visit: Payer: Medicare Other | Admitting: Podiatry

## 2021-03-19 ENCOUNTER — Other Ambulatory Visit: Payer: Self-pay

## 2021-03-19 DIAGNOSIS — D2371 Other benign neoplasm of skin of right lower limb, including hip: Secondary | ICD-10-CM

## 2021-03-19 DIAGNOSIS — M722 Plantar fascial fibromatosis: Secondary | ICD-10-CM

## 2021-03-19 MED ORDER — TRIAMCINOLONE ACETONIDE 40 MG/ML IJ SUSP
20.0000 mg | Freq: Once | INTRAMUSCULAR | Status: AC
  Administered 2021-03-19: 20 mg

## 2021-03-19 NOTE — Progress Notes (Signed)
He presents today for follow-up of his Planter fasciitis states that is the bottom of his heel is bothering him.  Objective: Vital signs stable alert oriented x3.  He has mild tenderness on palpation medial calcaneal tubercle but also has a solitary porokeratotic lesion.  No erythema edema cellulitis drainage or odor.  Assessment: Pain in limb secondary to Planter fasciitis right.  Benign skin lesion plantar heel right.  Plan: Debrided the benign skin lesion.  I injected the right heel with 20 mg Kenalog 5 mg Marcaine point maximal tenderness right.  Follow-up with him as needed.

## 2021-03-24 NOTE — Progress Notes (Signed)
Cardiology Office Note:    Date:  03/26/2021   ID:  Timothy Gallagher, DOB 10/22/1936, MRN [id number]  PCP:  [name], MD  Cardiologist:  Sinclair Grooms, MD   Referring MD: [name], MD   Chief Complaint  Patient presents with  . Coronary Artery Disease    History of Present Illness:    Timothy Gallagher is a 85 y.o. male with a hx of  a hx of coronary artery disease, history of CABG (LIMA-LAD, SVG-PDA, SVG-OM, SVG-diagonal) by Dr. [id number], hyperlipidemia,AAA (3.6 cm, 2022),hypertension,and history of kidney disease.  Of the knee by his wife.  They seem to have active though he has a memory deficit.  No admission to this.  Says he is doing well.  Moves around without chest pain or excessive shortness of breath.  Moving around less so now than he did previously.  Has occasional sharp left shooting chest discomfort.  This lasts seconds and goes away and has no correlation with activity.  Is not getting regular exercise.  Has not had syncope or palpitations.  Past Medical History:  Diagnosis Date  . ACNE ROSACEA 06/27/2009  . ACTINIC KERATOSIS, HEAD 04/18/2009  . Acute maxillary sinusitis 05/14/2010  . ALLERGIC RHINITIS 04/09/2007  . Anal fissure 04/07/2016  . B12 DEFICIENCY 06/07/2007  . BACK PAIN WITH RADICULOPATHY 04/24/2008  . Cancer ([place])    skin  . CHEST WALL PAIN, ACUTE 06/15/2009  . Chronic maxillary sinusitis 05/29/2008  . COLITIS 04/27/2009  . COLONIC POLYPS, HX OF 04/27/2009   tubular adenomas  . Coronary artery disease 05/12/2014   Cath 05/12/2014 w/ severe 3-vessel CAD and preserved LV function, EF 55%  . DERMATITIS, ATOPIC 10/12/2007  . DIVERTICULOSIS, COLON 04/27/2009  . ECCHYMOSES, SPONTANEOUS 06/27/2008  . Elevated sedimentation rate 05/02/2009  . ESOPHAGEAL STRICTURE 04/27/2009  . GASTRITIS, CHRONIC 04/27/2009  . HYPERLIPIDEMIA 03/13/2008  . HYPERTENSION 04/09/2007  . Internal bleeding hemorrhoids 01/22/2015   01/22/2015 Seen at anoscopy, grade 1 all 3  positions   . Irritable bowel syndrome 08/11/2007  . KIDNEY DISEASE 04/09/2007  . NECK PAIN 09/14/2008  . NEUROPATHY, IDIOPATHIC PERIPHERAL NEC 08/11/2007  . OSTEOARTHRITIS, WRIST, RIGHT 08/05/2010  . Postoperative delirium 05/20/2014  . S/P CABG x 4 05/19/2014   LIMA to LAD, SVG to diag, SVG to OM, SVG to PDA, EVH via right thigh and leg    Past Surgical History:  Procedure Laterality Date  . CARDIAC CATHETERIZATION    . COLONOSCOPY    . CORONARY ARTERY BYPASS GRAFT N/A 05/19/2014   Procedure: CORONARY ARTERY BYPASS GRAFTING (CABG);  Surgeon: Rexene Alberts, MD;  Location: [facility];  Service: Open Heart Surgery;  Laterality: N/A;  Times 4 using left internal mammary artery and endoscopically harvested right saphenous vein  . ESOPHAGOGASTRODUODENOSCOPY    . FINGER SURGERY     cut off end of finger  . FLEXIBLE SIGMOIDOSCOPY    . HEMORRHOID BANDING    . HERNIA REPAIR    . INCISION AND DRAINAGE WOUND WITH FOREIGN BODY REMOVAL Left 12/20/2013   Procedure: INCISION AND DRAINAGE LEFT INDEX FINGER;  Surgeon: Tennis Must, MD;  Location: [facility];  Service: Orthopedics;  Laterality: Left;  . INTRAOPERATIVE TRANSESOPHAGEAL ECHOCARDIOGRAM N/A 05/19/2014   Procedure: INTRAOPERATIVE TRANSESOPHAGEAL ECHOCARDIOGRAM;  Surgeon: Rexene Alberts, MD;  Location: [facility];  Service: Open Heart Surgery;  Laterality: N/A;  . lamenectomy    . LEFT HEART CATHETERIZATION WITH CORONARY ANGIOGRAM N/A 05/12/2014   Procedure:  LEFT HEART CATHETERIZATION WITH CORONARY ANGIOGRAM;  Surgeon: Sinclair Grooms, MD;  Location: [facility];  Service: Cardiovascular;  Laterality: N/A;  . LUMBAR FUSION    . TONSILLECTOMY    . VARICOSE VEIN SURGERY Left     Current Medications: Current Meds  Medication Sig  . acetaminophen (TYLENOL) 500 MG tablet Take 500 mg by mouth daily as needed (for pain). Reported on 04/11/2016  . aspirin 81 MG EC tablet Take 1 tablet (81 mg total) by mouth daily.  [name] Kitchen atorvastatin (LIPITOR) 20 MG tablet TAKE 1  TABLET(20 MG) BY MOUTH DAILY  . methimazole (TAPAZOLE) 5 MG tablet Take 0.5 tablets (2.5 mg total) by mouth 3 (three) times a week.  . Multiple Vitamins-Minerals (CENTRUM SILVER PO) Take 1 tablet by mouth daily.  . tamsulosin (FLOMAX) 0.4 MG CAPS capsule Take 0.4 mg by mouth. Prescribed by urology     Allergies:   Lipitor [atorvastatin], Trazodone and nefazodone, Ciprofloxacin, Mycophenolate mofetil, Amoxicillin, Penicillins, and Rosuvastatin   Social History   Socioeconomic History  . Marital status: Married    Spouse name: Not on file  . Number of children: 4  . Years of education: Not on file  . Highest education level: Not on file  Occupational History  . Occupation: retired    Fish farm manager: RETIRED    Comment: from [place] Use  . Smoking status: Former Smoker    Packs/day: 0.50    Years: 42.00    Pack years: 21.00    Types: Cigarettes    Quit date: 11/17/1998    Years since quitting: 22.3  . Smokeless tobacco: Never Used  Vaping Use  . Vaping Use: Never used  Substance and Sexual Activity  . Alcohol use: No  . Drug use: No  . Sexual activity: Yes  Other Topics Concern  . Not on file  Social History Narrative   Married 35 years (2nd marriage). 2 kids from previous marriage (lost a 70rd child hit by car at age 65). 2 stepchildren. 6 grandkids. 2 greatgrandkids.       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 Difficulty of Paying Living Expenses: Not hard at all  Food Insecurity: No Food Insecurity  . Worried About Charity fundraiser in the  Last Year: Never true  . Ran Out of Food in the Last Year: Never true  Transportation Needs: No Transportation Needs  . Lack of Transportation (Medical): No  . Lack of Transportation (Non-Medical): No  Physical Activity: Inactive  . Days of Exercise per Week: 0 days  . Minutes of Exercise per Session: 0 min  Stress: No Stress Concern Present  . Feeling of Stress : Not at all  Social Connections: Moderately Isolated  . Frequency of Communication with Friends and Family: More than three times a week  . Frequency of Social Gatherings with Friends and Family: Three times a week  . Attends Religious Services: Never  . Active Member of Clubs  or Organizations: No  . Attends Archivist Meetings: Never  . Marital Status: Married     Family History: The patient's family history includes COPD in his father; Early death in his daughter; Heart attack in his brother; Heart disease in his father; Hernia in his mother. There is no history of Colon cancer.  ROS:   Please see the history of present illness.    Denies orthopnea, PND, claudication.  Not careful with carbohydrates in his diet.  All other systems reviewed and are negative.  EKGs/Labs/Other Studies Reviewed:    The following studies were reviewed today: Abdominal duplex March 2022: Summary:  Abdominal Aorta: Aortoiliac atherosclerosis. There is evidence of abnormal  dilatation of the mid and distal Abdominal aorta. The largest aortic  measurement is 3.6 cm. The largest aortic diameter remains essentially  unchanged compared to prior exam.  Previous diameter measurement was 3.4 cm obtained on 01/24/2020.   Stenosis:  Widely patent bilateral common and external iliac arteries without  evidence of stenosis.   IVC/Iliac: Patent IVC.   EKG:  EKG normal sinus rhythm, hemiblock.  Compared to February 2021, Right bundle branch block has resolved.     Recent Labs: 08/06/2020: ALT 9; BUN 18; Creat 0.98; Hemoglobin 12.3;  Platelets 172; Potassium 4.3; Sodium 142 10/01/2020: TSH 4.62  Recent Lipid Panel    Component Value Date/Time   CHOL 125 04/20/2020 1147   CHOL 120 02/03/2018 0857   TRIG 109.0 04/20/2020 1147   HDL 42.20 04/20/2020 1147   HDL 51 02/03/2018 0857   CHOLHDL 3 04/20/2020 1147   VLDL 21.8 04/20/2020 1147   LDLCALC 61 04/20/2020 1147   LDLCALC 57 02/03/2018 0857   LDLDIRECT 91.0 05/23/2016 1346    Physical Exam:    VS:  BP 132/82   Pulse 83   Ht 6\' 2"  (1.88 m)   Wt 195 lb (88.5 kg)   SpO2 98%   BMI 25.04 kg/m     Wt Readings from Last 3 Encounters:  03/26/21 195 lb (88.5 kg)  03/26/21 195 lb 6.4 oz (88.6 kg)  03/25/21 195 lb 12.8 oz (88.8 kg)     GEN: Overweight/abdominal obesity. No acute distress HEENT: Normal NECK: No JVD. LYMPHATICS: No lymphadenopathy CARDIAC: No murmur. RRR no gallop, or edema. VASCULAR:  Normal Pulses. No bruits. RESPIRATORY:  Clear to auscultation without rales, wheezing or rhonchi  ABDOMEN: Soft, non-tender, non-distended, No pulsatile mass, MUSCULOSKELETAL: No deformity  SKIN: Warm and dry NEUROLOGIC:  Alert and oriented x 3 PSYCHIATRIC:  Normal affect   ASSESSMENT:    1. Coronary artery disease involving coronary bypass graft of native heart without angina pectoris   2. Essential hypertension   3. Hyperlipidemia, unspecified hyperlipidemia type   4. Abdominal aortic aneurysm (AAA) without rupture ([place])   5. Right bundle branch block    PLAN:    In order of problems listed above:  1. Secondary prevention discussed 2. Low-salt diet discussed.  Continue tamsulosin and low-salt diet 3. Continue Lipitor 20 mg/day.  Most recent LDL was 61 in June.  This will be done again at his upcoming visit with [name]. 4. Abdominal aortic aneurysm now 3.6 cm in diameter.  Repeat duplex scan in 1 year.  There has been slow growth over the last several years from 3.3 cm. 5. Resolved on today's EKG.  Overall education and awareness concerning  secondary risk prevention was discussed in detail: LDL less than 70, hemoglobin A1c less than 7, blood pressure target less  than 130/80 mmHg, >150 minutes of moderate aerobic activity per week, avoidance of smoking, weight control (via diet and exercise), and continued surveillance/management of/for obstructive sleep apnea.  We emphasized safe moderate activity attempting to achieve relief 30 minutes of movement per day    Medication Adjustments/Labs and Tests Ordered: Current medicines are reviewed at length with the patient today.  Concerns regarding medicines are outlined above.  Orders Placed This Encounter  Procedures  . EKG 12-Lead   No orders of the defined types were placed in this encounter.   There are no Patient Instructions on file for this visit.   Signed, Sinclair Grooms, MD  03/26/2021 12:03 PM    [place]

## 2021-03-25 ENCOUNTER — Other Ambulatory Visit: Payer: Self-pay

## 2021-03-25 ENCOUNTER — Encounter: Payer: Self-pay | Admitting: Family Medicine

## 2021-03-25 ENCOUNTER — Ambulatory Visit (INDEPENDENT_AMBULATORY_CARE_PROVIDER_SITE_OTHER): Payer: Medicare Other | Admitting: Family Medicine

## 2021-03-25 VITALS — BP 146/82 | HR 80 | Temp 98.2°F | Ht 74.0 in | Wt 195.8 lb

## 2021-03-25 DIAGNOSIS — E538 Deficiency of other specified B group vitamins: Secondary | ICD-10-CM

## 2021-03-25 DIAGNOSIS — I10 Essential (primary) hypertension: Secondary | ICD-10-CM | POA: Diagnosis not present

## 2021-03-25 DIAGNOSIS — R109 Unspecified abdominal pain: Secondary | ICD-10-CM

## 2021-03-25 MED ORDER — CYANOCOBALAMIN 1000 MCG/ML IJ SOLN
1000.0000 ug | Freq: Once | INTRAMUSCULAR | Status: AC
  Administered 2021-03-25: 1000 ug via INTRAMUSCULAR

## 2021-03-25 NOTE — Patient Instructions (Signed)
It was very nice to see you today!  I am glad your abdominal pain went away.  Please let us know if it returns.    We will give you B12 injection today.  Take care, Dr Jerline Pain  PLEASE NOTE:  If you had any lab tests please let us know if you have not heard back within a few days. You may see your results on mychart before we have a chance to review them but we will give you a call once they are reviewed by [place]. If we ordered any referrals today, please let us know if you have not heard from their office within the next week.   Please try these tips to maintain a healthy lifestyle:   Eat at least 3 REAL meals and 1-2 snacks per day.  Aim for no more than 5 hours between eating.  If you eat breakfast, please do so within one hour of getting up.    Each meal should contain half fruits/vegetables, one quarter protein, and one quarter carbs (no bigger than a computer mouse)   Cut down on sweet beverages. This includes juice, soda, and sweet tea.     Drink at least 1 glass of water with each meal and aim for at least 8 glasses per day   Exercise at least 150 minutes every week.

## 2021-03-25 NOTE — Progress Notes (Signed)
   Timothy Gallagher is a 85 y.o. male who presents today for an office visit.  Assessment/Plan:  New/Acute Problems: Abdominal pain Reassuring exam today.  No red flags.  Possibly muscular strain.  He will let us know if symptoms return.  Do not need to do any further work-up at this point as his symptoms have been resolved for several days.  Chronic Problems Addressed Today: B12 Deficiency B12 injection given IM today.  Will need to have repeat levels checked soon.  Essential Hypertension Borderline elevated though at goal per JNC 8.  Home readings typically less than 140/90.  Less than 140/90 at his last office visit with his PCP.  We will continue monitoring for now.  He will let us know if persistently elevated.    Subjective:  HPI:   See A/P for status of chronic conditions patient would like to have his B12 injection today.  He has no other acute complaints today.  Couple of weeks ago developed left lower quadrant pain.  This radiated into his mid abdomen and his right upper quadrant.  This lasted for a few days and then subsided with aspirin.  No constipation or diarrhea.  No melena or hematochezia.  He has not had any symptoms for several days.       Objective:  Physical Exam: BP (!) 146/82   Pulse 80   Temp 98.2 F (36.8 C) (Temporal)   Ht 6\' 2"  (1.88 m)   Wt 195 lb 12.8 oz (88.8 kg)   SpO2 97%   BMI 25.14 kg/m   Gen: No acute distress, resting comfortably CV: Regular rate and rhythm with no murmurs appreciated Pulm: Normal work of breathing, clear to auscultation bilaterally with no crackles, wheezes, or rhonchi GI: Soft, nontender, nondistended Neuro: Grossly normal, moves all extremities Psych: Normal affect and thought content      Zelphia Glover M. Jerline Pain, MD 03/25/2021 10:40 AM

## 2021-03-26 ENCOUNTER — Ambulatory Visit: Payer: Medicare Other | Admitting: Interventional Cardiology

## 2021-03-26 ENCOUNTER — Ambulatory Visit: Payer: Medicare Other | Admitting: Endocrinology

## 2021-03-26 ENCOUNTER — Encounter: Payer: Self-pay | Admitting: Interventional Cardiology

## 2021-03-26 VITALS — BP 160/80 | HR 82 | Ht 74.0 in | Wt 195.4 lb

## 2021-03-26 VITALS — BP 132/82 | HR 83 | Ht 74.0 in | Wt 195.0 lb

## 2021-03-26 DIAGNOSIS — I25810 Atherosclerosis of coronary artery bypass graft(s) without angina pectoris: Secondary | ICD-10-CM | POA: Diagnosis not present

## 2021-03-26 DIAGNOSIS — I714 Abdominal aortic aneurysm, without rupture, unspecified: Secondary | ICD-10-CM

## 2021-03-26 DIAGNOSIS — E0590 Thyrotoxicosis, unspecified without thyrotoxic crisis or storm: Secondary | ICD-10-CM

## 2021-03-26 DIAGNOSIS — I4510 Unspecified right bundle-branch block: Secondary | ICD-10-CM

## 2021-03-26 DIAGNOSIS — I10 Essential (primary) hypertension: Secondary | ICD-10-CM

## 2021-03-26 DIAGNOSIS — E785 Hyperlipidemia, unspecified: Secondary | ICD-10-CM

## 2021-03-26 LAB — T4, FREE: Free T4: 0.93 ng/dL (ref 0.60–1.60)

## 2021-03-26 LAB — TSH: TSH: 0.98 u[IU]/mL (ref 0.35–4.50)

## 2021-03-26 NOTE — Patient Instructions (Signed)
Medication Instructions:  Your physician recommends that you continue on your current medications as directed. Please refer to the Current Medication list given to you today.  *If you need a refill on your cardiac medications before your next appointment, please call your pharmacy*   Lab Work: None If you have labs (blood work) drawn today and your tests are completely normal, you will receive your results only by: . MyChart Message (if you have MyChart) OR . A paper copy in the mail If you have any lab test that is abnormal or we need to change your treatment, we will call you to review the results.   Testing/Procedures: None   Follow-Up: At [facility], you and your health needs are our priority.  As part of our continuing mission to provide you with exceptional heart care, we have created designated Provider Care Teams.  These Care Teams include your primary Cardiologist (physician) and Advanced Practice Providers (APPs -  Physician Assistants and Nurse Practitioners) who all work together to provide you with the care you need, when you need it.  We recommend signing up for the patient portal called "MyChart".  Sign up information is provided on this After Visit Summary.  MyChart is used to connect with patients for Virtual Visits (Telemedicine).  Patients are able to view lab/test results, encounter notes, upcoming appointments, etc.  Non-urgent messages can be sent to your provider as well.   To learn more about what you can do with MyChart, go to [email or web address].    Your next appointment:   1 year(s)  The format for your next appointment:   In Person  Provider:   You may see Henry W Smith III, MD or one of the following Advanced Practice Providers on your designated Care Team:    Jill McDaniel, NP    Other Instructions  Your provider recommends that you maintain 150 minutes per week of moderate aerobic activity.   

## 2021-03-26 NOTE — Progress Notes (Signed)
Subjective:    Patient ID: Timothy Gallagher, male    DOB: 1936-02-24, 85 y.o.   MRN: [id number]  HPI Pt returns for f/u of hyperthyroidism (dx'ed early 2019; TFT improved initially, but recurred in July, 2019, and was started on tapazole; he has never had thyroid imaging; he takes toprol for CAD, not thyroid; he declines RAI).  Since the tapazole was decreased, pt states he feels no different, and well in general.   Past Medical History:  Diagnosis Date  . ACNE ROSACEA 06/27/2009  . ACTINIC KERATOSIS, HEAD 04/18/2009  . Acute maxillary sinusitis 05/14/2010  . ALLERGIC RHINITIS 04/09/2007  . Anal fissure 04/07/2016  . B12 DEFICIENCY 06/07/2007  . BACK PAIN WITH RADICULOPATHY 04/24/2008  . Cancer ([place])    skin  . CHEST WALL PAIN, ACUTE 06/15/2009  . Chronic maxillary sinusitis 05/29/2008  . COLITIS 04/27/2009  . COLONIC POLYPS, HX OF 04/27/2009   tubular adenomas  . Coronary artery disease 05/12/2014   Cath 05/12/2014 w/ severe 3-vessel CAD and preserved LV function, EF 55%  . DERMATITIS, ATOPIC 10/12/2007  . DIVERTICULOSIS, COLON 04/27/2009  . ECCHYMOSES, SPONTANEOUS 06/27/2008  . Elevated sedimentation rate 05/02/2009  . ESOPHAGEAL STRICTURE 04/27/2009  . GASTRITIS, CHRONIC 04/27/2009  . HYPERLIPIDEMIA 03/13/2008  . HYPERTENSION 04/09/2007  . Internal bleeding hemorrhoids 01/22/2015   01/22/2015 Seen at anoscopy, grade 1 all 3 positions   . Irritable bowel syndrome 08/11/2007  . KIDNEY DISEASE 04/09/2007  . NECK PAIN 09/14/2008  . NEUROPATHY, IDIOPATHIC PERIPHERAL NEC 08/11/2007  . OSTEOARTHRITIS, WRIST, RIGHT 08/05/2010  . Postoperative delirium 05/20/2014  . S/P CABG x 4 05/19/2014   LIMA to LAD, SVG to diag, SVG to OM, SVG to PDA, EVH via right thigh and leg    Past Surgical History:  Procedure Laterality Date  . CARDIAC CATHETERIZATION    . COLONOSCOPY    . CORONARY ARTERY BYPASS GRAFT N/A 05/19/2014   Procedure: CORONARY ARTERY BYPASS GRAFTING (CABG);  Surgeon: Rexene Alberts, MD;  Location: [facility];  Service: Open Heart Surgery;  Laterality: N/A;  Times 4 using left internal mammary artery and endoscopically harvested right saphenous vein  . ESOPHAGOGASTRODUODENOSCOPY    . FINGER SURGERY     cut off end of finger  . FLEXIBLE SIGMOIDOSCOPY    . HEMORRHOID BANDING    . HERNIA REPAIR    . INCISION AND DRAINAGE WOUND WITH FOREIGN BODY REMOVAL Left 12/20/2013   Procedure: INCISION AND DRAINAGE LEFT INDEX FINGER;  Surgeon: Tennis Must, MD;  Location: [facility];  Service: Orthopedics;  Laterality: Left;  . INTRAOPERATIVE TRANSESOPHAGEAL ECHOCARDIOGRAM N/A 05/19/2014   Procedure: INTRAOPERATIVE TRANSESOPHAGEAL ECHOCARDIOGRAM;  Surgeon: Rexene Alberts, MD;  Location: [facility];  Service: Open Heart Surgery;  Laterality: N/A;  . lamenectomy    . LEFT HEART CATHETERIZATION WITH CORONARY ANGIOGRAM N/A 05/12/2014   Procedure: LEFT HEART CATHETERIZATION WITH CORONARY ANGIOGRAM;  Surgeon: Sinclair Grooms, MD;  Location: [facility] C[facility];  Service: Cardiovascular;  Laterality: N/A;  . LUMBAR FUSION    . TONSILLECTOMY    . VARICOSE VEIN SURGERY Left     Social History   Socioeconomic History  . Marital status: Married    Spouse name: Not on file  . Number of children: 4  . Years of education: Not on file  . Highest education level: Not on file  Occupational History  . Occupation: retired    Fish farm manager: RETIRED    Comment: from [place] Use  .  Smoking status: Former Smoker    Packs/day: 0.50    Years: 42.00    Pack years: 21.00    Types: Cigarettes    Quit date: 11/17/1998    Years since quitting: 22.3  . Smokeless tobacco: Never Used  Vaping Use  . Vaping Use: Never used  Substance and Sexual Activity  . Alcohol use: No  . Drug use: No  . Sexual activity: Yes  Other Topics Concern  . Not on file  Social History Narrative   Married 35 years (2nd marriage). 2 kids from previous marriage (lost a 43rd child hit by car at age 73). 2 stepchildren. 6 grandkids. 2 greatgrandkids.        Retired from [employer]: walking/exercise, building in shop-furniture (cut finger off in February doing this)   6 grand children - Has bought them all a new car    Brother died of probably addiction   Sister also died; not sure of cause    He enjoys his life; Likes to play the keyboard; guitar    Had played in a gospel quartet    Enjoys working with wood    Social Determinants of Radio broadcast assistant Strain: Low Risk   . Difficulty of Paying Living Expenses: Not hard at all  Food Insecurity: No Food Insecurity  . Worried About Charity fundraiser in the Last Year: Never true  . Ran Out of Food in the Last Year: Never true  Transportation Needs: No Transportation Needs  . Lack of Transportation (Medical): No  . Lack of Transportation (Non-Medical): No  Physical Activity: Inactive  . Days of Exercise per Week: 0 days  . Minutes of Exercise per Session: 0 min  Stress: No Stress Concern Present  . Feeling of Stress : Not at all  Social Connections: Moderately Isolated  . Frequency of Communication with Friends and Family: More than three times a week  . Frequency of Social Gatherings with Friends and Family: Three times a week  . Attends Religious Services: Never  . Active Member of Clubs or Organizations: No  . Attends Archivist Meetings: Never  . Marital Status: Married  Human resources officer Violence: Not At Risk  . Fear of Current or Ex-Partner: No  . Emotionally Abused: No  . Physically Abused: No  . Sexually Abused: No    Current Outpatient Medications on File Prior to Visit  Medication Sig Dispense Refill  . acetaminophen (TYLENOL) 500 MG tablet Take 500 mg by mouth daily as needed (for pain). Reported on 04/11/2016    . aspirin 81 MG EC tablet Take 1 tablet (81 mg total) by mouth daily.    [name] Kitchen atorvastatin (LIPITOR) 20 MG tablet TAKE 1 TABLET(20 MG) BY MOUTH DAILY 90 tablet 3  . methimazole (TAPAZOLE) 5 MG tablet Take 0.5 tablets  (2.5 mg total) by mouth 3 (three) times a week. 20 tablet 3  . Multiple Vitamins-Minerals (CENTRUM SILVER PO) Take 1 tablet by mouth daily.    . tamsulosin (FLOMAX) 0.4 MG CAPS capsule Take 0.4 mg by mouth. Prescribed by urology     No current facility-administered medications on file prior to visit.    Allergies  Allergen Reactions  . Lipitor [Atorvastatin] Other (See Comments)    REACTION: nausea and blurred vision  . Trazodone And Nefazodone Other (See Comments)    dizzy  . Ciprofloxacin Swelling  . Mycophenolate Mofetil Other (See Comments)    REACTION: unspecified  .  Amoxicillin Rash    Has patient had a PCN reaction causing immediate rash, facial/tongue/throat swelling, SOB or lightheadedness with hypotension: Unknown Has patient had a PCN reaction causing severe rash involving mucus membranes or skin necrosis: Unknown Has patient had a PCN reaction that required hospitalization: Unknown Has patient had a PCN reaction occurring within the last 10 years: Unknown If all of the above answers are "NO", then may proceed with Cephalosporin use.   [name] Kitchen Penicillins Rash    Has patient had a PCN reaction causing immediate rash, facial/tongue/throat swelling, SOB or lightheadedness with hypotension: Unknown Has patient had a PCN reaction causing severe rash involving mucus membranes or skin necrosis: Unknown Has patient had a PCN reaction that required hospitalization: Unknown Has patient had a PCN reaction occurring within the last 10 years: Unknown If all of the above answers are "NO", then may proceed with Cephalosporin use.   . Rosuvastatin Other (See Comments)    Unknown     Family History  Problem Relation Age of Onset  . Hernia Mother   . Heart disease Father        smoker  . COPD Father   . Heart attack Brother   . Early death Daughter   . Colon cancer Neg Hx     BP (!) 160/80 (BP Location: Right Arm, Patient Position: Sitting, Cuff Size: Normal)   Pulse 82   Ht 6\' 2"   (1.88 m)   Wt 195 lb 6.4 oz (88.6 kg)   SpO2 96%   BMI 25.09 kg/m    Review of Systems Denies fever.      Objective:   Physical Exam VITAL SIGNS:  See vs page GENERAL: no distress NECK: There is no palpable thyroid enlargement.  No thyroid nodule is palpable.  No palpable lymphadenopathy at the anterior neck.   Lab Results  Component Value Date   TSH 0.98 03/26/2021       Assessment & Plan:  Hyperthyroidism: well-controlled.  Please continue the same methimazole.

## 2021-03-26 NOTE — Patient Instructions (Addendum)
Your blood pressure is high today.  Please see your primary care provider soon, to have it rechecked Blood tests are requested for you today.  We'll let you know about the results.  If ever you have fever while taking methimazole, stop it and call us, even if the reason is obvious, because of the risk of a rare side-effect.  It is best to never miss the medication.  However, if you do miss it, next best is to double up the next time.   Please come back for a follow-up appointment in 6 months.   

## 2021-03-28 ENCOUNTER — Ambulatory Visit: Payer: Medicare Other

## 2021-04-09 ENCOUNTER — Encounter: Payer: Self-pay | Admitting: Podiatry

## 2021-04-09 ENCOUNTER — Ambulatory Visit: Payer: Medicare Other | Admitting: Podiatry

## 2021-04-09 ENCOUNTER — Telehealth: Payer: Self-pay | Admitting: *Deleted

## 2021-04-09 ENCOUNTER — Other Ambulatory Visit: Payer: Self-pay

## 2021-04-09 DIAGNOSIS — M722 Plantar fascial fibromatosis: Secondary | ICD-10-CM

## 2021-04-09 MED ORDER — TRIAMCINOLONE ACETONIDE 40 MG/ML IJ SUSP
20.0000 mg | Freq: Once | INTRAMUSCULAR | Status: AC
Start: 2021-04-09 — End: 2021-04-09
  Administered 2021-04-09: 20 mg

## 2021-04-09 NOTE — Telephone Encounter (Signed)
Patient was told to get certain type of shoe at last visit, can't remember the name. Please advise.

## 2021-04-10 NOTE — Telephone Encounter (Signed)
I called Mr.Weatherall this morning  to let him know that Dr.hyatt recommends him to get  new balance or with [facility].

## 2021-04-10 NOTE — Progress Notes (Signed)
He presents today for follow-up of his right heel he states that the heel and the whole outside of the foot hurts.  Objective: Vital signs are stable he is alert oriented x3 he still retains some pain to the plantar medial aspect of the calcaneus right heel at the plantar fashion calcaneal insertion site this is resulting in pain to the fourth fifth tarsometatarsal joint of that foot.  Assessment: Planter fasciitis with lateral compensatory syndrome right.  Plan: We discussed appropriate shoe gear and once again today discussing [facility] as well as new balance tennis shoes and I reinjected his right heel 20 mg of Kenalog 5 mg Marcaine.

## 2021-04-30 ENCOUNTER — Other Ambulatory Visit: Payer: Self-pay

## 2021-04-30 ENCOUNTER — Ambulatory Visit (INDEPENDENT_AMBULATORY_CARE_PROVIDER_SITE_OTHER): Payer: Medicare Other | Admitting: *Deleted

## 2021-04-30 DIAGNOSIS — E538 Deficiency of other specified B group vitamins: Secondary | ICD-10-CM | POA: Diagnosis not present

## 2021-04-30 MED ORDER — CYANOCOBALAMIN 1000 MCG/ML IJ SOLN
1000.0000 ug | Freq: Once | INTRAMUSCULAR | Status: AC
  Administered 2021-04-30: 1000 ug via INTRAMUSCULAR

## 2021-04-30 NOTE — Progress Notes (Signed)
I have reviewed the patient's encounter and agree with the documentation.  Algis Greenhouse. Jerline Pain, MD 04/30/2021 9:07 AM

## 2021-04-30 NOTE — Progress Notes (Signed)
Per orders of Dr. Jerline Pain, injection of Cyanocobalamin 1000 mcg/ml  given IM by [facility] Fam  in right deltoid. Patient tolerated injection well. Patient will make appointment for 1 month

## 2021-05-16 ENCOUNTER — Encounter: Payer: Self-pay | Admitting: Family Medicine

## 2021-05-16 ENCOUNTER — Ambulatory Visit (INDEPENDENT_AMBULATORY_CARE_PROVIDER_SITE_OTHER): Payer: Medicare Other | Admitting: Family Medicine

## 2021-05-16 ENCOUNTER — Other Ambulatory Visit: Payer: Self-pay

## 2021-05-16 ENCOUNTER — Telehealth: Payer: Self-pay

## 2021-05-16 ENCOUNTER — Encounter: Payer: Medicare Other | Admitting: Family Medicine

## 2021-05-16 VITALS — BP 138/84 | HR 80 | Temp 97.8°F | Ht 74.0 in | Wt 193.2 lb

## 2021-05-16 DIAGNOSIS — J449 Chronic obstructive pulmonary disease, unspecified: Secondary | ICD-10-CM

## 2021-05-16 DIAGNOSIS — E538 Deficiency of other specified B group vitamins: Secondary | ICD-10-CM

## 2021-05-16 DIAGNOSIS — R739 Hyperglycemia, unspecified: Secondary | ICD-10-CM

## 2021-05-16 DIAGNOSIS — E785 Hyperlipidemia, unspecified: Secondary | ICD-10-CM | POA: Diagnosis not present

## 2021-05-16 DIAGNOSIS — E0590 Thyrotoxicosis, unspecified without thyrotoxic crisis or storm: Secondary | ICD-10-CM

## 2021-05-16 DIAGNOSIS — I25810 Atherosclerosis of coronary artery bypass graft(s) without angina pectoris: Secondary | ICD-10-CM

## 2021-05-16 DIAGNOSIS — I10 Essential (primary) hypertension: Secondary | ICD-10-CM

## 2021-05-16 DIAGNOSIS — Z0000 Encounter for general adult medical examination without abnormal findings: Secondary | ICD-10-CM | POA: Diagnosis not present

## 2021-05-16 DIAGNOSIS — I714 Abdominal aortic aneurysm, without rupture, unspecified: Secondary | ICD-10-CM

## 2021-05-16 LAB — COMPREHENSIVE METABOLIC PANEL
ALT: 13 U/L (ref 0–53)
AST: 19 U/L (ref 0–37)
Albumin: 3.9 g/dL (ref 3.5–5.2)
Alkaline Phosphatase: 75 U/L (ref 39–117)
BUN: 15 mg/dL (ref 6–23)
CO2: 27 mEq/L (ref 19–32)
Calcium: 9 mg/dL (ref 8.4–10.5)
Chloride: 106 mEq/L (ref 96–112)
Creatinine, Ser: 0.91 mg/dL (ref 0.40–1.50)
GFR: 78.28 mL/min (ref >60.00)
Glucose, Bld: 89 mg/dL (ref 70–99)
Potassium: 4.1 mEq/L (ref 3.5–5.1)
Sodium: 141 mEq/L (ref 135–145)
Total Bilirubin: 0.6 mg/dL (ref 0.2–1.2)
Total Protein: 7 g/dL (ref 6.0–8.3)

## 2021-05-16 LAB — CBC WITH DIFFERENTIAL/PLATELET
Basophils Absolute: 0 10*3/uL (ref 0.0–0.1)
Basophils Relative: 0.5 % (ref 0.0–3.0)
Eosinophils Absolute: 0.2 10*3/uL (ref 0.0–0.7)
Eosinophils Relative: 3.4 % (ref 0.0–5.0)
HCT: 40.4 % (ref 39.0–52.0)
Hemoglobin: 13.4 g/dL (ref 13.0–17.0)
Lymphocytes Relative: 24.9 % (ref 12.0–46.0)
Lymphs Abs: 1.3 10*3/uL (ref 0.7–4.0)
MCHC: 33.1 g/dL (ref 30.0–36.0)
MCV: 84.9 fl (ref 78.0–100.0)
Monocytes Absolute: 0.4 10*3/uL (ref 0.1–1.0)
Monocytes Relative: 7.4 % (ref 3.0–12.0)
Neutro Abs: 3.4 10*3/uL (ref 1.4–7.7)
Neutrophils Relative %: 63.8 % (ref 43.0–77.0)
Platelets: 162 10*3/uL (ref 150.0–400.0)
RBC: 4.76 Mil/uL (ref 4.22–5.81)
RDW: 16.2 % — ABNORMAL HIGH (ref 11.5–15.5)
WBC: 5.3 10*3/uL (ref 4.0–10.5)

## 2021-05-16 LAB — POC URINALSYSI DIPSTICK (AUTOMATED)
Bilirubin, UA: NEGATIVE
Blood, UA: NEGATIVE
Glucose, UA: NEGATIVE
Ketones, UA: NEGATIVE
Leukocytes, UA: NEGATIVE
Nitrite, UA: NEGATIVE
Protein, UA: POSITIVE — AB
Spec Grav, UA: 1.025 (ref 1.010–1.025)
Urobilinogen, UA: 0.2 E.U./dL
pH, UA: 5.5 (ref 5.0–8.0)

## 2021-05-16 LAB — HEMOGLOBIN A1C: Hgb A1c MFr Bld: 6.4 % (ref 4.6–6.5)

## 2021-05-16 LAB — VITAMIN B12: Vitamin B-12: >1550 pg/mL — ABNORMAL HIGH (ref 211–911)

## 2021-05-16 LAB — LIPID PANEL
Cholesterol: 139 mg/dL (ref 0–200)
HDL: 47.5 mg/dL (ref >39.00)
LDL Cholesterol: 72 mg/dL (ref 0–99)
NonHDL: 91.77
Total CHOL/HDL Ratio: 3
Triglycerides: 98 mg/dL (ref 0.0–149.0)
VLDL: 19.6 mg/dL (ref 0.0–40.0)

## 2021-05-16 MED ORDER — CYANOCOBALAMIN 1000 MCG/ML IJ SOLN
1000.0000 ug | Freq: Once | INTRAMUSCULAR | Status: AC
  Administered 2021-05-16: 1000 ug via INTRAMUSCULAR

## 2021-05-16 MED ORDER — ROSUVASTATIN CALCIUM 20 MG PO TABS: 20.0000 mg | ORAL_TABLET | Freq: Every day | ORAL | 3 refills | Status: DC

## 2021-05-16 NOTE — Patient Instructions (Addendum)
Health Maintenance Due  Topic Date Due   COVID-19 Vaccine (4 - Booster for [employer] series) please complete this 11/28/2020   Please do a B-12 injection before you leave today and schedule monthly.  Please stop by lab before you go If you have mychart- we will send your results within 3 business days of [place] receiving them.  If you do not have mychart- we will call you about results within 5 business days of [place] receiving them.  *please also note that you will see labs on mychart as soon as they post. I will later go in and write notes on them- will say "notes from [name]"  No changes today unless labs lead [place] to make changes  Recommended follow up: Return in about 6 months (around 11/15/2021) for follow-up- or sooner if needed.

## 2021-05-16 NOTE — Telephone Encounter (Signed)
Pt has CPE today.

## 2021-05-16 NOTE — Addendum Note (Signed)
Addended by: Linton Ham on: 05/16/2021 09:08 AM   Modules accepted: Orders

## 2021-05-16 NOTE — Addendum Note (Signed)
Addended by: Loura Back on: 05/16/2021 09:32 AM   Modules accepted: Orders

## 2021-05-16 NOTE — Progress Notes (Signed)
Phone: [phone]   Subjective:  Patient presents today for their annual physical. Chief complaint-noted.   See problem oriented charting- ROS- full  review of systems was completed and negative  except for: baseline dizziness no change  The following were reviewed and entered/updated in epic: Past Medical History:  Diagnosis Date   ACNE ROSACEA 06/27/2009   ACTINIC KERATOSIS, HEAD 04/18/2009   Acute maxillary sinusitis 05/14/2010   ALLERGIC RHINITIS 04/09/2007   Anal fissure 04/07/2016   B12 DEFICIENCY 06/07/2007   BACK PAIN WITH RADICULOPATHY 04/24/2008   Cancer ([place])    skin   CHEST WALL PAIN, ACUTE 06/15/2009   Chronic maxillary sinusitis 05/29/2008   COLITIS 04/27/2009   COLONIC POLYPS, HX OF 04/27/2009   tubular adenomas   Coronary artery disease 05/12/2014   Cath 05/12/2014 w/ severe 3-vessel CAD and preserved LV function, EF 55%   DERMATITIS, ATOPIC 10/12/2007   DIVERTICULOSIS, COLON 04/27/2009   ECCHYMOSES, SPONTANEOUS 06/27/2008   Elevated sedimentation rate 05/02/2009   ESOPHAGEAL STRICTURE 04/27/2009   GASTRITIS, CHRONIC 04/27/2009   HYPERLIPIDEMIA 03/13/2008   HYPERTENSION 04/09/2007   Internal bleeding hemorrhoids 01/22/2015   01/22/2015 Seen at anoscopy, grade 1 all 3 positions    Irritable bowel syndrome 08/11/2007   KIDNEY DISEASE 04/09/2007   NECK PAIN 09/14/2008   NEUROPATHY, IDIOPATHIC PERIPHERAL NEC 08/11/2007   OSTEOARTHRITIS, WRIST, RIGHT 08/05/2010   Postoperative delirium 05/20/2014   S/P CABG x 4 05/19/2014   LIMA to LAD, SVG to diag, SVG to OM, SVG to PDA, EVH via right thigh and leg   Patient Active Problem List   Diagnosis Date Noted   Hyperthyroidism 01/13/2018    Priority: High   Detached retina, left 06/30/2017    Priority: High   AAA (abdominal aortic aneurysm) ([place]) 05/12/2017    Priority: High   CAD (coronary artery disease) of artery bypass graft 05/19/2014    Priority: High   Memory loss 11/01/2020    Priority: Medium   History of atrial fibrillation  08/26/2019    Priority: Medium   BPPV (benign paroxysmal positional vertigo) 09/01/2016    Priority: Medium   Insomnia 11/22/2014    Priority: Medium   Anemia 07/20/2014    Priority: Medium   BPH (benign prostatic hyperplasia) 12/05/2013    Priority: Medium   Hyperlipidemia 03/13/2008    Priority: Medium   B12 deficiency 06/07/2007    Priority: Medium   Essential hypertension 04/09/2007    Priority: Medium   Chronic obstructive pulmonary disease ([place]) 08/26/2019    Priority: Low   Left sided abdominal pain 04/15/2017    Priority: Low   Low back pain 02/06/2017    Priority: Low   Anal fissure 04/07/2016    Priority: Low   Internal and external bleeding hemorrhoids 01/22/2015    Priority: Low   Palpitations 02/11/2013    Priority: Low   OSTEOARTHRITIS, WRIST, RIGHT 08/05/2010    Priority: Low   ACNE ROSACEA 06/27/2009    Priority: Low   ESOPHAGEAL STRICTURE 04/27/2009    Priority: Low   ACTINIC KERATOSIS, HEAD 04/18/2009    Priority: Low   NECK PAIN 09/14/2008    Priority: Low   NEUROPATHY, IDIOPATHIC PERIPHERAL NEC 08/11/2007    Priority: Low   Irritable bowel syndrome 08/11/2007    Priority: Low   ALLERGIC RHINITIS 04/09/2007    Priority: Low   Past Surgical History:  Procedure Laterality Date   CARDIAC CATHETERIZATION     COLONOSCOPY     CORONARY ARTERY BYPASS GRAFT  N/A 05/19/2014   Procedure: CORONARY ARTERY BYPASS GRAFTING (CABG);  Surgeon: Rexene Alberts, MD;  Location: [facility];  Service: Open Heart Surgery;  Laterality: N/A;  Times 4 using left internal mammary artery and endoscopically harvested right saphenous vein   ESOPHAGOGASTRODUODENOSCOPY     FINGER SURGERY     cut off end of finger   FLEXIBLE SIGMOIDOSCOPY     HEMORRHOID BANDING     HERNIA REPAIR     INCISION AND DRAINAGE WOUND WITH FOREIGN BODY REMOVAL Left 12/20/2013   Procedure: INCISION AND DRAINAGE LEFT INDEX FINGER;  Surgeon: Tennis Must, MD;  Location: [facility];  Service: Orthopedics;   Laterality: Left;   INTRAOPERATIVE TRANSESOPHAGEAL ECHOCARDIOGRAM N/A 05/19/2014   Procedure: INTRAOPERATIVE TRANSESOPHAGEAL ECHOCARDIOGRAM;  Surgeon: Rexene Alberts, MD;  Location: [facility];  Service: Open Heart Surgery;  Laterality: N/A;   lamenectomy     LEFT HEART CATHETERIZATION WITH CORONARY ANGIOGRAM N/A 05/12/2014   Procedure: LEFT HEART CATHETERIZATION WITH CORONARY ANGIOGRAM;  Surgeon: Sinclair Grooms, MD;  Location[facility];  Service: Cardiovascular;  Laterality: N/A;   LUMBAR FUSION     TONSILLECTOMY     VARICOSE VEIN SURGERY Left     Family History  Problem Relation Age of Onset   Hernia Mother    Heart disease Father        smoker   COPD Father    Heart attack Brother    Early death Daughter    Colon cancer Neg Hx     Medications- reviewed and updated Current Outpatient Medications  Medication Sig Dispense Refill   acetaminophen (TYLENOL) 500 MG tablet Take 500 mg by mouth daily as needed (for pain). Reported on 04/11/2016     aspirin 81 MG EC tablet Take 1 tablet (81 mg total) by mouth daily.     atorvastatin (LIPITOR) 20 MG tablet TAKE 1 TABLET(20 MG) BY MOUTH DAILY 90 tablet 3   methimazole (TAPAZOLE) 5 MG tablet Take 0.5 tablets (2.5 mg total) by mouth 3 (three) times a week. 20 tablet 3   Multiple Vitamins-Minerals (CENTRUM SILVER PO) Take 1 tablet by mouth daily.     tamsulosin (FLOMAX) 0.4 MG CAPS capsule Take 0.4 mg by mouth. Prescribed by urology     No current facility-administered medications for this visit.    Allergies-reviewed and updated Allergies  Allergen Reactions   Lipitor [Atorvastatin] Other (See Comments)    REACTION: nausea and blurred vision   Trazodone And Nefazodone Other (See Comments)    dizzy   Ciprofloxacin Swelling   Mycophenolate Mofetil Other (See Comments)    REACTION: unspecified   Amoxicillin Rash    Has patient had a PCN reaction causing immediate rash, facial/tongue/throat swelling, SOB or lightheadedness with  hypotension: Unknown Has patient had a PCN reaction causing severe rash involving mucus membranes or skin necrosis: Unknown Has patient had a PCN reaction that required hospitalization: Unknown Has patient had a PCN reaction occurring within the last 10 years: Unknown If all of the above answers are "NO", then may proceed with Cephalosporin use.    Penicillins Rash    Has patient had a PCN reaction causing immediate rash, facial/tongue/throat swelling, SOB or lightheadedness with hypotension: Unknown Has patient had a PCN reaction causing severe rash involving mucus membranes or skin necrosis: Unknown Has patient had a PCN reaction that required hospitalization: Unknown Has patient had a PCN reaction occurring within the last 10 years: Unknown If all of the above answers are "NO",  then may proceed with Cephalosporin use.    Rosuvastatin Other (See Comments)    Unknown     Social History   Social History Narrative   Married 35 years (2nd marriage). 2 kids from previous marriage (lost a 47rd child hit by car at age 24). 2 stepchildren. 6 grandkids. 2 greatgrandkids.       Retired from [employer]: walking/exercise, building in shop-furniture (cut finger off in February doing this)   6 grand children - Has bought them all a new car    Brother died of probably addiction   Sister also died; not sure of cause    He enjoys his life; Likes to play the keyboard; guitar    Had played in a gospel quartet    Enjoys working with wood    Objective  Objective:  BP 138/84   Pulse 80   Temp 97.8 F (36.6 C) (Temporal)   Ht 6\' 2"  (1.88 m)   Wt 193 lb 3.2 oz (87.6 kg)   SpO2 96%   BMI 24.81 kg/m  Gen: NAD, resting comfortably HEENT: Mucous membranes are moist. Oropharynx normal- dentures present Neck: no thyromegaly CV: RRR no murmurs rubs or gallops Lungs: CTAB no crackles, wheeze, rhonchi Abdomen: soft/nontender/nondistended/normal bowel sounds. No rebound or  guarding.  Ext: no edema Skin: warm, dry Neuro: grossly normal, moves all extremities   Assessment and Plan  85 y.o. male presenting for annual physical.  Health Maintenance counseling: 1. Anticipatory guidance: Patient counseled regarding regular dental exams -dentures- has dentist if needed, eye exams -yearly- history of detatched retina on the left unfortunately,  avoiding smoking and second hand smoke , limiting alcohol to 2 beverages per day -doesn't drink.   2. Risk factor reduction:  Advised patient of need for regular exercise and diet rich and fruits and vegetables to reduce risk of heart attack and stroke. Exercise- active at home- encouraged walking last year- he has mowed yard. Diet-reasonably healthy- Weight stable since last physical Wt Readings from Last 3 Encounters:  05/16/21 193 lb 3.2 oz (87.6 kg)  03/26/21 195 lb (88.5 kg)  03/26/21 195 lb 6.4 oz (88.6 kg)  3. Immunizations/screenings/ancillary studies- Prevnar 20recommended today-declines for now. Recommended COVID-19 vaccination #4  Immunization History  Administered Date(s) Administered   Fluad Quad(high Dose 65+) 07/28/2019, 08/22/2020   Influenza Split 08/11/2011, 08/05/2012   Influenza Whole 08/31/2007, 08/15/2008, 08/20/2009, 08/05/2010   Influenza, High Dose Seasonal PF 07/28/2016, 09/10/2017, 08/13/2018   Influenza,inj,Quad PF,6+ Mos 07/15/2013, 07/20/2014, 08/10/2015   Influenza,inj,quad, With Preservative 08/17/2018   PFIZER(Purple Top)SARS-COV-2 Vaccination 11/28/2019, 12/22/2019, 08/28/2020   Pneumococcal Conjugate-13 11/04/2013   Pneumococcal Polysaccharide-23 02/05/2015   Td 03/18/2007   Tdap 06/04/2018   Zoster Recombinat (Shingrix) 08/15/2019, 10/31/2019   Zoster, Live 04/16/2011   4. Prostate cancer screening- Past age based screening recommendations-patient denies recent change in urinary symptoms   5. Colon cancer screening - Normal colonoscopy 11/21/2009- no repeat due to age.  No blood in the  stool or melena reported. Did have some loose stool last nigh- he will let us know if persists.   6. Skin cancer screening- sees dermatology as needed. advised regular sunscreen use. Denies worrisome, changing, or new skin lesions.  7. Former smoker- quit in 2000- get UA with labs.  Patient with known aortic aneurysm-recently checked March 2022 with repeat in 2 years planned.  Measured 3.6 cm 8. STD screening - optsout as monogamous  Status of chronic or acute  concerns   #CAD- with history of CABG in 2015 - follows with Dr. Daneen Schick #hyperlipidemia - LDL goal under 70 S: Medication:atorvastatin 20 mg, aspirin 81 mg  Lab Results  Component Value Date   CHOL 125 04/20/2020   HDL 42.20 04/20/2020   LDLCALC 61 04/20/2020   LDLDIRECT 91.0 05/23/2016   TRIG 109.0 04/20/2020   CHOLHDL 3 04/20/2020  A/P:  lipids hopefully stable- update lipid panel today. Continue current meds for now  CAD asymptomatic- continue current meds  #hyperthyroidism- follows with Dr. Loanne Drilling S: compliant On thyroid medication- methimazole 2.5 mg BID Lab Results Lab Results  Component Value Date   TSH 0.98 03/26/2021  A/P:Patient will continue to follow closely with Dr. Ezekiel Slocumb he has been doing reasonably well lately.  #hypertension/orthostatic hypotension S: medication:  none -orthostatic intolerance on ARB and beta blocker in past- and BP has trended down. Still with some orthostatic issues on tamsulosin BP Readings from Last 3 Encounters:  05/16/21 138/84  03/26/21 132/82  03/26/21 (!) 160/80  A/P: Stable. Continue current medications.   # AAA S:Stable 3.3 cm infrarenal AAA on 01/24/20-increased to 3.6 cm in 2022 with 2-year repeat planned A/P: continue cardiology follow up and BP control   # BPH S:history of severe UTI leading to hospitalization. Orthostatic issues but feel we have to leave him on flomax  A/P: Stable. Continue current medications.    # B12 deficiency S: Current  treatment/medication (oral vs. IM):  1058mcg injections montly Lab Results  Component Value Date   [id number] 08/06/2020  A/P: hopefully stable- update b12 today. Continue current meds for now . Reports needs injection today  # Hyperglycemia/insulin resistance/prediabetes- peak a1c of 6.3 S: Medication: none  Lab Results  Component Value Date   HGBA1C 6.3 (H) 05/17/2014   HGBA1C 6.1 (H) 05/17/2007  A/P: hopefully stable- update a1c today  #COPD-noted on prior imaging 01/10/2018-patient with no active symptoms-continue to monitor   #denies memory changes recently- concern for memory loss in past  Recommended follow up: Return in about 6 months (around 11/15/2021) for foll. Future Appointments  Date Time Provider [facility]  05/21/2021  9:45 AM [place], Bennie Pierini T, [place] [facility]  10/01/2021 10:00 AM Renato Shin, MD [facility] None  10/03/2021  1:45 PM [facility]   Lab/Order associations: fasting   ICD-10-CM   1. Preventative health care  Z00.00     2. Hyperlipidemia, unspecified hyperlipidemia type  E78.5     3. B12 deficiency  E53.8     4. Hyperglycemia  R73.9     5. Chronic obstructive pulmonary disease, unspecified COPD type (HCC) Chronic J44.9     6. Hyperthyroidism  E05.90     7. Coronary artery disease involving coronary bypass graft of native heart without angina pectoris  I25.810     8. Abdominal aortic aneurysm (AAA) without rupture (HCC)  I71.4     9. Essential hypertension  I10       No orders of the defined types were placed in this encounter.  I,Harris Phan,acting as a Education administrator for [name], MD.,have documented all relevant documentation on the behalf of [name], MD,as directed by  [name], MD while in the presence of [name], MD.   I, [name], MD, have reviewed all documentation for this visit. The documentation on 05/16/21 for the exam, diagnosis, procedures, and orders are all  accurate and complete.   Return precautions advised.  [name], MD

## 2021-05-16 NOTE — Telephone Encounter (Signed)
Nurse Assessment Nurse: Rufina Falco, RN, Deb Date/Time (Eastern Time): 05/16/2021 6:37:16 AM Confirm and document reason for call. If symptomatic, describe symptoms. ---Caller states he has diarrhea and feels weak. Symptoms started overnight. Does the patient have any new or worsening symptoms? ---Yes Will a triage be completed? ---Yes Related visit to physician within the last 2 weeks? ---N/A Does the PT have any chronic conditions? (i.e. diabetes, asthma, this includes High risk factors for pregnancy, etc.) ---No Is this a behavioral health or substance abuse call? ---No Guidelines Guideline Title Affirmed Question Affirmed Notes Nurse Date/Time (Eastern Time) Diarrhea Black or tarry bowel movements (Exception: chronicunchanged black-grey bowel movements AND is taking iron pills or PeptoBismol) Rufina Falco, RN, Deb 05/16/2021 6:37:51 AM Disp. Time Eilene Ghazi Time) Disposition Final User PLEASE NOTE: All timestamps contained within this report are represented as [place] Standard Time. CONFIDENTIALTY NOTICE: This fax transmission is intended only for the addressee. It contains information that is legally privileged, confidential or otherwise protected from use or disclosure. If you are not the intended recipient, you are strictly prohibited from reviewing, disclosing, copying using or disseminating any of this information or taking any action in reliance on or regarding this information. If you have received this fax in error, please notify us immediately by telephone so that we can arrange for its return to [place]. Phone: [phone], Toll-Free: [phone], Fax: [phone] Page: 2 of 2 Call Id: [id number] 05/16/2021 6:39:11 AM Go to ED Now Yes Rufina Falco, RN, Deb Caller Disagree/Comply Disagree Caller Understands Yes PreDisposition InappropriateToAsk Care Advice Given Per Guideline CARE ADVICE given per Diarrhea (Adult) guideline. ANOTHER ADULT SHOULD DRIVE: * It is better and safer if another adult  drives instead of you. GO TO ED NOW: * You need to be seen in the Emergency Department. * Go to the ED at ___________ [place] now. Drive carefully. Referrals GO TO FACILITY REFUSE

## 2021-05-21 ENCOUNTER — Other Ambulatory Visit: Payer: Self-pay

## 2021-05-21 ENCOUNTER — Ambulatory Visit: Payer: Medicare Other | Admitting: Podiatry

## 2021-05-21 DIAGNOSIS — M7751 Other enthesopathy of right foot: Secondary | ICD-10-CM | POA: Diagnosis not present

## 2021-05-21 MED ORDER — DEXAMETHASONE SODIUM PHOSPHATE 120 MG/30ML IJ SOLN
2.0000 mg | Freq: Once | INTRAMUSCULAR | Status: AC
  Administered 2021-05-21: 2 mg via INTRA_ARTICULAR

## 2021-05-22 NOTE — Progress Notes (Signed)
He presents today for follow-up of the pain to his right foot around the plantar fascial.  States that really there is not been that much pain around the heel but I have had some mobic here as he refers to the fifth metatarsal head pointing to it.  He denies any trauma to that.  Objective: Vital signs are stable he is alert and orient x3 no reproducible pain on palpation of the heel or medial lateral compression of the heel.  He does have tenderness on palpation of the fifth metatarsal without edema without erythema.  The majority of the discomfort is located in a fluctuant area along the lateral and plantar lateral aspect of the fifth metatarsal head right foot.  Assessment: Resolving Planter fasciitis bursitis capsulitis fifth metatarsophalangeal joint right foot.  Plan: Injected the area today with 2 mg of dexamethasone local anesthetic and he will follow-up with [place] on an as-needed basis.

## 2021-05-31 ENCOUNTER — Other Ambulatory Visit: Payer: Self-pay

## 2021-05-31 ENCOUNTER — Encounter: Payer: Medicare Other | Admitting: Physician Assistant

## 2021-05-31 NOTE — Progress Notes (Signed)
Acute Office Visit  Subjective:    Patient ID: Timothy Gallagher, male    DOB: July 14, 1936, 85 y.o.   MRN: [id number]  Chief Complaint  Patient presents with   Fatigue   Dizziness    HPI Patient is in today for fatigue and dizziness. Pt states he has had these symptoms for years and it only happens when he stands up too quickly. He does not known who made the appointment for him today and says he does not need to be seen.  Past Medical History:  Diagnosis Date   ACNE ROSACEA 06/27/2009   ACTINIC KERATOSIS, HEAD 04/18/2009   Acute maxillary sinusitis 05/14/2010   ALLERGIC RHINITIS 04/09/2007   Anal fissure 04/07/2016   B12 DEFICIENCY 06/07/2007   BACK PAIN WITH RADICULOPATHY 04/24/2008   Cancer ([place])    skin   CHEST WALL PAIN, ACUTE 06/15/2009   Chronic maxillary sinusitis 05/29/2008   COLITIS 04/27/2009   COLONIC POLYPS, HX OF 04/27/2009   tubular adenomas   Coronary artery disease 05/12/2014   Cath 05/12/2014 w/ severe 3-vessel CAD and preserved LV function, EF 55%   DERMATITIS, ATOPIC 10/12/2007   DIVERTICULOSIS, COLON 04/27/2009   ECCHYMOSES, SPONTANEOUS 06/27/2008   Elevated sedimentation rate 05/02/2009   ESOPHAGEAL STRICTURE 04/27/2009   GASTRITIS, CHRONIC 04/27/2009   HYPERLIPIDEMIA 03/13/2008   HYPERTENSION 04/09/2007   Internal bleeding hemorrhoids 01/22/2015   01/22/2015 Seen at anoscopy, grade 1 all 3 positions    Irritable bowel syndrome 08/11/2007   KIDNEY DISEASE 04/09/2007   NECK PAIN 09/14/2008   NEUROPATHY, IDIOPATHIC PERIPHERAL NEC 08/11/2007   OSTEOARTHRITIS, WRIST, RIGHT 08/05/2010   Postoperative delirium 05/20/2014   S/P CABG x 4 05/19/2014   LIMA to LAD, SVG to diag, SVG to OM, SVG to PDA, EVH via right thigh and leg    Past Surgical History:  Procedure Laterality Date   CARDIAC CATHETERIZATION     COLONOSCOPY     CORONARY ARTERY BYPASS GRAFT N/A 05/19/2014   Procedure: CORONARY ARTERY BYPASS GRAFTING (CABG);  Surgeon: Rexene Alberts, MD;  Location: [facility];  Service: Open  Heart Surgery;  Laterality: N/A;  Times 4 using left internal mammary artery and endoscopically harvested right saphenous vein   ESOPHAGOGASTRODUODENOSCOPY     FINGER SURGERY     cut off end of finger   FLEXIBLE SIGMOIDOSCOPY     HEMORRHOID BANDING     HERNIA REPAIR     INCISION AND DRAINAGE WOUND WITH FOREIGN BODY REMOVAL Left 12/20/2013   Procedure: INCISION AND DRAINAGE LEFT INDEX FINGER;  Surgeon: Tennis Must, MD;  Location: [facility];  Service: Orthopedics;  Laterality: Left;   INTRAOPERATIVE TRANSESOPHAGEAL ECHOCARDIOGRAM N/A 05/19/2014   Procedure: INTRAOPERATIVE TRANSESOPHAGEAL ECHOCARDIOGRAM;  Surgeon: Rexene Alberts, MD;  Location: [facility];  Service: Open Heart Surgery;  Laterality: N/A;   lamenectomy     LEFT HEART CATHETERIZATION WITH CORONARY ANGIOGRAM N/A 05/12/2014   Procedure: LEFT HEART CATHETERIZATION WITH CORONARY ANGIOGRAM;  Surgeon: Sinclair Grooms, MD;  Location: [facility] CATH [facility];  Service: Cardiovascular;  Laterality: N/A;   LUMBAR FUSION     TONSILLECTOMY     VARICOSE VEIN SURGERY Left     Family History  Problem Relation Age of Onset   Hernia Mother    Heart disease Father        smoker   COPD Father    Heart attack Brother    Early death Daughter    Colon cancer Neg Hx  Social History   Socioeconomic History   Marital status: Married    Spouse name: Not on file   Number of children: 4   Years of education: Not on file   Highest education level: Not on file  Occupational History   Occupation: retired    Fish farm manager: RETIRED    Comment: from [place] Use   Smoking status: Former    Packs/day: 0.50    Years: 42.00    Pack years: 21.00    Types: Cigarettes    Quit date: 11/17/1998    Years since quitting: 22.5   Smokeless tobacco: Never  Vaping Use   Vaping Use: Never used  Substance and Sexual Activity   Alcohol use: No   Drug use: No   Sexual activity: Yes  Other Topics Concern   Not on file  Social History Narrative   Married 35 years  (2nd marriage). 2 kids from previous marriage (lost a 43rd child hit by car at age 35). 2 stepchildren. 6 grandkids. 2 greatgrandkids.       Retired from [employer]: walking/exercise, building in shop-furniture (cut finger off in February doing this)   6 grand children - Has bought them all a new car    Brother died of probably addiction   Sister also died; not sure of cause    He enjoys his life; Likes to play the keyboard; guitar    Had played in a gospel quartet    Enjoys working with wood    Social Determinants of Radio broadcast assistant Strain: Low Risk    Difficulty of Paying Living Expenses: Not hard at all  Food Insecurity: No Food Insecurity   Worried About Charity fundraiser in the Last Year: Never true   Arboriculturist in the Last Year: Never true  Transportation Needs: No Transportation Needs   Lack of Transportation (Medical): No   Lack of Transportation (Non-Medical): No  Physical Activity: Inactive   Days of Exercise per Week: 0 days   Minutes of Exercise per Session: 0 min  Stress: No Stress Concern Present   Feeling of Stress : Not at all  Social Connections: Moderately Isolated   Frequency of Communication with Friends and Family: More than three times a week   Frequency of Social Gatherings with Friends and Family: Three times a week   Attends Religious Services: Never   Active Member of Clubs or Organizations: No   Attends Archivist Meetings: Never   Marital Status: Married  Human resources officer Violence: Not At Risk   Fear of Current or Ex-Partner: No   Emotionally Abused: No   Physically Abused: No   Sexually Abused: No    Outpatient Medications Prior to Visit  Medication Sig Dispense Refill   acetaminophen (TYLENOL) 500 MG tablet Take 500 mg by mouth daily as needed (for pain). Reported on 04/11/2016     aspirin 81 MG EC tablet Take 1 tablet (81 mg total) by mouth daily.     methimazole (TAPAZOLE) 5 MG tablet  Take 0.5 tablets (2.5 mg total) by mouth 3 (three) times a week. 20 tablet 3   Multiple Vitamins-Minerals (CENTRUM SILVER PO) Take 1 tablet by mouth daily.     rosuvastatin (CRESTOR) 20 MG tablet Take 1 tablet (20 mg total) by mouth daily. 90 tablet 3   tamsulosin (FLOMAX) 0.4 MG CAPS capsule Take 0.4 mg by mouth. Prescribed by urology  No facility-administered medications prior to visit.    Allergies  Allergen Reactions   Lipitor [Atorvastatin] Other (See Comments)    REACTION: nausea and blurred vision   Trazodone And Nefazodone Other (See Comments)    dizzy   Ciprofloxacin Swelling   Mycophenolate Mofetil Other (See Comments)    REACTION: unspecified   Amoxicillin Rash    Has patient had a PCN reaction causing immediate rash, facial/tongue/throat swelling, SOB or lightheadedness with hypotension: Unknown Has patient had a PCN reaction causing severe rash involving mucus membranes or skin necrosis: Unknown Has patient had a PCN reaction that required hospitalization: Unknown Has patient had a PCN reaction occurring within the last 10 years: Unknown If all of the above answers are "NO", then may proceed with Cephalosporin use.    Penicillins Rash    Has patient had a PCN reaction causing immediate rash, facial/tongue/throat swelling, SOB or lightheadedness with hypotension: Unknown Has patient had a PCN reaction causing severe rash involving mucus membranes or skin necrosis: Unknown Has patient had a PCN reaction that required hospitalization: Unknown Has patient had a PCN reaction occurring within the last 10 years: Unknown If all of the above answers are "NO", then may proceed with Cephalosporin use.    Rosuvastatin Other (See Comments)    Unknown     Review of Systems     Objective:    Physical Exam  BP 128/81   Pulse 81   Temp (!) 97 F (36.1 C)   Ht 6\' 2"  (1.88 m)   Wt 191 lb 3.2 oz (86.7 kg)   SpO2 98%   BMI 24.55 kg/m  Wt Readings from Last 3 Encounters:   05/31/21 191 lb 3.2 oz (86.7 kg)  05/16/21 193 lb 3.2 oz (87.6 kg)  03/26/21 195 lb (88.5 kg)    Health Maintenance Due  Topic Date Due   COVID-19 Vaccine (4 - Booster for Pf[employer] series) 11/28/2020    There are no preventive care reminders to display for this patient.   Lab Results  Component Value Date   TSH 0.98 03/26/2021   Lab Results  Component Value Date   WBC 5.3 05/16/2021   HGB 13.4 05/16/2021   HCT 40.4 05/16/2021   MCV 84.9 05/16/2021   PLT 162.0 05/16/2021   Lab Results  Component Value Date   NA 141 05/16/2021   K 4.1 05/16/2021   CO2 27 05/16/2021   GLUCOSE 89 05/16/2021   BUN 15 05/16/2021   CREATININE 0.91 05/16/2021   BILITOT 0.6 05/16/2021   ALKPHOS 75 05/16/2021   AST 19 05/16/2021   ALT 13 05/16/2021   PROT 7.0 05/16/2021   ALBUMIN 3.9 05/16/2021   CALCIUM 9.0 05/16/2021   ANIONGAP 9 01/01/2020   GFR 78.28 05/16/2021   Lab Results  Component Value Date   CHOL 139 05/16/2021   Lab Results  Component Value Date   HDL 47.50 05/16/2021   Lab Results  Component Value Date   LDLCALC 72 05/16/2021   Lab Results  Component Value Date   TRIG 98.0 05/16/2021   Lab Results  Component Value Date   CHOLHDL 3 05/16/2021   Lab Results  Component Value Date   HGBA1C 6.4 05/16/2021       Assessment & Plan:   Problem List Items Addressed This Visit   None    No orders of the defined types were placed in this encounter.    Zyere Jiminez M Nyliah Nierenberg, PA-C

## 2021-07-04 ENCOUNTER — Ambulatory Visit (INDEPENDENT_AMBULATORY_CARE_PROVIDER_SITE_OTHER): Payer: Medicare Other

## 2021-07-04 ENCOUNTER — Other Ambulatory Visit: Payer: Self-pay

## 2021-07-04 DIAGNOSIS — E538 Deficiency of other specified B group vitamins: Secondary | ICD-10-CM

## 2021-07-04 MED ORDER — CYANOCOBALAMIN 1000 MCG/ML IJ SOLN
1000.0000 ug | Freq: Once | INTRAMUSCULAR | Status: AC
  Administered 2021-07-04: 1000 ug via INTRAMUSCULAR

## 2021-07-04 NOTE — Progress Notes (Signed)
Per orders of [name], injection of B-12 given by Tobe Sos in right deltoid. Patient tolerated injection well. Patient will make appointment for 1 month.

## 2021-08-05 DIAGNOSIS — H524 Presbyopia: Secondary | ICD-10-CM | POA: Diagnosis not present

## 2021-08-05 DIAGNOSIS — H35372 Puckering of macula, left eye: Secondary | ICD-10-CM | POA: Diagnosis not present

## 2021-08-05 DIAGNOSIS — H353131 Nonexudative age-related macular degeneration, bilateral, early dry stage: Secondary | ICD-10-CM | POA: Diagnosis not present

## 2021-08-05 DIAGNOSIS — Z8669 Personal history of other diseases of the nervous system and sense organs: Secondary | ICD-10-CM | POA: Diagnosis not present

## 2021-08-06 ENCOUNTER — Ambulatory Visit (INDEPENDENT_AMBULATORY_CARE_PROVIDER_SITE_OTHER): Payer: Medicare Other

## 2021-08-06 ENCOUNTER — Other Ambulatory Visit: Payer: Self-pay

## 2021-08-06 DIAGNOSIS — E538 Deficiency of other specified B group vitamins: Secondary | ICD-10-CM | POA: Diagnosis not present

## 2021-08-06 MED ORDER — CYANOCOBALAMIN 1000 MCG/ML IJ SOLN
1000.0000 ug | Freq: Once | INTRAMUSCULAR | Status: AC
  Administered 2021-08-06: 1000 ug via INTRAMUSCULAR

## 2021-08-06 NOTE — Progress Notes (Signed)
After obtaining consent, and per orders of [name], injection of vitamin B12 given by Loralyn Freshwater.   Patient tolerated injection well.

## 2021-09-10 ENCOUNTER — Other Ambulatory Visit: Payer: Self-pay

## 2021-09-10 ENCOUNTER — Ambulatory Visit (INDEPENDENT_AMBULATORY_CARE_PROVIDER_SITE_OTHER): Payer: Medicare Other

## 2021-09-10 DIAGNOSIS — E538 Deficiency of other specified B group vitamins: Secondary | ICD-10-CM | POA: Diagnosis not present

## 2021-09-10 MED ORDER — CYANOCOBALAMIN 1000 MCG/ML IJ SOLN
1000.0000 ug | Freq: Once | INTRAMUSCULAR | Status: AC
  Administered 2021-09-10: 1000 ug via INTRAMUSCULAR

## 2021-09-10 NOTE — Progress Notes (Signed)
Timothy Gallagher, 85 year old male presents in office today for b12 injection per [name]. Tolerated injection well, states he gets injections every 5 weeks.

## 2021-09-30 ENCOUNTER — Encounter: Payer: Self-pay | Admitting: Family Medicine

## 2021-09-30 ENCOUNTER — Ambulatory Visit (INDEPENDENT_AMBULATORY_CARE_PROVIDER_SITE_OTHER): Payer: Medicare Other | Admitting: Family Medicine

## 2021-09-30 ENCOUNTER — Other Ambulatory Visit: Payer: Self-pay

## 2021-09-30 VITALS — BP 130/70 | HR 72 | Temp 98.5°F | Ht 74.0 in | Wt 193.8 lb

## 2021-09-30 DIAGNOSIS — I25810 Atherosclerosis of coronary artery bypass graft(s) without angina pectoris: Secondary | ICD-10-CM

## 2021-09-30 DIAGNOSIS — E785 Hyperlipidemia, unspecified: Secondary | ICD-10-CM | POA: Diagnosis not present

## 2021-09-30 DIAGNOSIS — I714 Abdominal aortic aneurysm, without rupture, unspecified: Secondary | ICD-10-CM

## 2021-09-30 DIAGNOSIS — I10 Essential (primary) hypertension: Secondary | ICD-10-CM | POA: Diagnosis not present

## 2021-09-30 DIAGNOSIS — M79604 Pain in right leg: Secondary | ICD-10-CM

## 2021-09-30 DIAGNOSIS — R739 Hyperglycemia, unspecified: Secondary | ICD-10-CM

## 2021-09-30 DIAGNOSIS — I951 Orthostatic hypotension: Secondary | ICD-10-CM

## 2021-09-30 NOTE — Progress Notes (Signed)
Phone [phone] In person visit   Subjective:   Timothy Gallagher is a 85 y.o. year old very pleasant male patient who presents for/with See problem oriented charting Chief Complaint  Patient presents with   Follow-up    Pt c/o right Leg pain only that does not hurt all the time but has been coming and going for a while now.   This visit occurred during the SARS-CoV-2 public health emergency.  Safety protocols were in place, including screening questions prior to the visit, additional usage of staff PPE, and extensive cleaning of exam room while observing appropriate contact time as indicated for disinfecting solutions.   Past Medical History-  Patient Active Problem List   Diagnosis Date Noted   Hyperthyroidism 01/13/2018    Priority: High   Detached retina, left 06/30/2017    Priority: High   AAA (abdominal aortic aneurysm) 05/12/2017    Priority: High   CAD (coronary artery disease) of artery bypass graft 05/19/2014    Priority: High   Memory loss 11/01/2020    Priority: Medium    History of atrial fibrillation 08/26/2019    Priority: Medium    BPPV (benign paroxysmal positional vertigo) 09/01/2016    Priority: Medium    Insomnia 11/22/2014    Priority: Medium    Anemia 07/20/2014    Priority: Medium    BPH (benign prostatic hyperplasia) 12/05/2013    Priority: Medium    Hyperlipidemia 03/13/2008    Priority: Medium    B12 deficiency 06/07/2007    Priority: Medium    Essential hypertension 04/09/2007    Priority: Medium    Chronic obstructive pulmonary disease ([place]) 08/26/2019    Priority: Low   Left sided abdominal pain 04/15/2017    Priority: Low   Low back pain 02/06/2017    Priority: Low   Anal fissure 04/07/2016    Priority: Low   Internal and external bleeding hemorrhoids 01/22/2015    Priority: Low   Palpitations 02/11/2013    Priority: Low   OSTEOARTHRITIS, WRIST, RIGHT 08/05/2010    Priority: Low   ACNE ROSACEA 06/27/2009    Priority: Low    ESOPHAGEAL STRICTURE 04/27/2009    Priority: Low   ACTINIC KERATOSIS, HEAD 04/18/2009    Priority: Low   NECK PAIN 09/14/2008    Priority: Low   NEUROPATHY, IDIOPATHIC PERIPHERAL NEC 08/11/2007    Priority: Low   Irritable bowel syndrome 08/11/2007    Priority: Low   ALLERGIC RHINITIS 04/09/2007    Priority: Low    Medications- reviewed and updated Current Outpatient Medications  Medication Sig Dispense Refill   acetaminophen (TYLENOL) 500 MG tablet Take 500 mg by mouth daily as needed (for pain). Reported on 04/11/2016     aspirin 81 MG EC tablet Take 1 tablet (81 mg total) by mouth daily.     methimazole (TAPAZOLE) 5 MG tablet Take 0.5 tablets (2.5 mg total) by mouth 3 (three) times a week. 20 tablet 3   Multiple Vitamins-Minerals (CENTRUM SILVER PO) Take 1 tablet by mouth daily.     rosuvastatin (CRESTOR) 20 MG tablet Take 1 tablet (20 mg total) by mouth daily. 90 tablet 3   tamsulosin (FLOMAX) 0.4 MG CAPS capsule Take 0.4 mg by mouth. Prescribed by urology     No current facility-administered medications for this visit.     Objective:  BP 130/70   Pulse 72   Temp 98.5 F (36.9 C)   Ht 6\' 2"  (1.88 m)   Wt 193  lb 12.8 oz (87.9 kg)   SpO2 97%   BMI 24.88 kg/m  Gen: NAD, resting comfortably CV: RRR no murmurs rubs or gallops Lungs: CTAB no crackles, wheeze, rhonchi Ext: no edema-no calf pain or swelling in particular Skin: warm, dry MSK: No calf pain or swelling.  No knee pain noted.  Pain with palpation of musculature lateral to tibia.  Normal strength in lower extremities.  Negative straight leg raise    Assessment and Plan   # right leg pain S:patient with right leg pain for 2-3 years that comes and goes- starts around the knee and then goes down into lateral lower leg.  No numbness or tingling. No incontinence. No leg weakness. Gets better with movement after a few minutes. Up to moderate pain. No pain on left side. More likely to occur after sititng- hurts when  stands up but gets better with mvoement.   No calf swelling- no calf pain- pain in musculature proximal to Tibia.  A/P: Patient with 2 to 3 years of intermittent right lower leg pain lateral portion without associated back pain though reports history of back surgery-very tender in musculature proximal to tibia laterally.  No calf pain or swelling noted-doubt DVT.  Also strongly consider physical therapy.  With duration of symptoms he would like to see sports medicine I think is reasonable-if he has new or worsening symptoms should certainly let me know  #CAD- with history of CABG in 2015 - followed with Dr. Daneen Schick #hyperlipidemia - LDL goal under 70 S: Medication: Rosuvastatin 20 mg, aspirin 81 mg daily  No chest pain or shortness of breath reported today Lab Results  Component Value Date   CHOL 139 05/16/2021   HDL 47.50 05/16/2021   LDLCALC 72 05/16/2021   LDLDIRECT 91.0 05/23/2016   TRIG 98.0 05/16/2021   CHOLHDL 3 05/16/2021   A/P: CAD is asymptomatic-continue current medication.  LDL slightly above goal last visit-encouraged compliance with rosuvastatin 20 mg advance from atorvastatin 20 mg last visit-update lipids with next blood work  #hyperthyroidism- followed with Dr. Loanne Drilling S: compliant On thyroid medication- methimazole 2.5 mg BID Lab Results  Component Value Date   TSH 0.98 03/26/2021    A/P: Has been stable-continue current medication  #hypertension/orthostatic hypotension S: medication:  none -orthostatic intolerance on ARB and beta blocker in past- and BP had trended down. Still with some orthostatic issues on tamsulosin BP Readings from Last 3 Encounters:  09/30/21 130/70  05/31/21 128/81  05/16/21 138/84  A/P: Well-controlled without medication-continue to monitor without meds  # AAA S:Stable 3.3 cm infrarenal AAA on 01/24/20-increased to 3.6 cm in 2022 with 2-year repeat planned- controlled by cardiology A/P: Has been stable-follow-up 2024-continue to  control risk factors for progression piquantly blood pressure while being mindful of orthostatic risks  # BPH S:history of severe UTI leading to hospitalization. Orthostatic issues but felt we had to leave him on flomax  A/P: Stable-continue current medication  # Hyperglycemia/insulin resistance/prediabetes- peak a1c of 6.4sp S: Medication: none  Lab Results  Component Value Date   HGBA1C 6.4 05/16/2021   HGBA1C 6.3 (H) 05/17/2014   HGBA1C 6.1 (H) 05/17/2007    A/P: Too soon for repeat-continue to monitor with next blood work  Recommended follow up: No follow-ups on file. Future Appointments  Date Time Provider [place]  10/01/2021 10:00 AM Renato Shin, MD [facility] None  10/03/2021  1:45 PM [facility] HEALTH COACH [facility]  10/15/2021 10:30 AM [facility] NURSE [facility]  11/27/2021  8:40 AM [name], MD [facility]    Lab/Order associations:   ICD-10-CM   1. Right leg pain  M79.604 Ambulatory referral to Sports Medicine    2. Coronary artery disease involving coronary bypass graft of native heart without angina pectoris  I25.810     3. Essential hypertension  I10     4. Hyperlipidemia, unspecified hyperlipidemia type  E78.5     5. Orthostatic hypotension  I95.1     6. Abdominal aortic aneurysm (AAA) without rupture, unspecified part  I71.40     7. Hyperglycemia  R73.9       No orders of the defined types were placed in this encounter.   I,Jada Bradford,acting as a scribe for [name], MD.,have documented all relevant documentation on the behalf of [name], MD,as directed by  [name], MD while in the presence of [name], MD.   I, [name], MD, have reviewed all documentation for this visit. The documentation on 09/30/21 for the exam, diagnosis, procedures, and orders are all accurate and complete.   Return precautions advised.  [name], MD

## 2021-09-30 NOTE — Patient Instructions (Addendum)
Health Maintenance Due  Topic Date Due   COVID-19 Vaccine (4 - Booster for [employer] series)-  Please consider new bivalent booster shot at your local pharmacy. When you receive, please send [place] the date so we can have it in our system.  10/23/2020   INFLUENZA VACCINE - team please get the date of his flu shot from his pharmacy- if he has not had this year can give to him 06/17/2021   We will call you within two weeks about your referral to Sports Medicine. If you do not hear within 2 weeks, give [place] a call.  If you have worsening pain prior to that please let me know- if calf pain or swelling let us know as well  Please try diet and exercising to help with your cholesterol. Try to get at least 150 minutes per week (30 minutes a day)!  Recommended follow up: keep January visit or sooner if needed

## 2021-10-01 ENCOUNTER — Ambulatory Visit: Payer: Medicare Other | Admitting: Endocrinology

## 2021-10-01 VITALS — BP 168/82 | HR 72 | Ht 74.0 in | Wt 195.0 lb

## 2021-10-01 DIAGNOSIS — E0590 Thyrotoxicosis, unspecified without thyrotoxic crisis or storm: Secondary | ICD-10-CM | POA: Diagnosis not present

## 2021-10-01 LAB — TSH: TSH: 0.02 u[IU]/mL — ABNORMAL LOW (ref 0.35–5.50)

## 2021-10-01 LAB — T4, FREE: Free T4: 1.43 ng/dL (ref 0.60–1.60)

## 2021-10-01 MED ORDER — METHIMAZOLE 5 MG PO TABS
5.0000 mg | ORAL_TABLET | Freq: Every day | ORAL | 3 refills | Status: DC
Start: 2021-10-01 — End: 2022-09-04

## 2021-10-01 NOTE — Progress Notes (Signed)
Subjective:    Patient ID: Timothy Gallagher, male    DOB: January 03, 1936, 85 y.o.   MRN: [id number]  HPI Pt returns for f/u of hyperthyroidism (dx'ed early 2019; TFT improved initially, but recurred in July, 2019, and was started on tapazole; he has never had thyroid imaging; he takes toprol for CAD, not thyroid; he declines RAI).  pt states he feels no different, and well in general.  Specifically, he denies palpitations and tremor.  BP is normal at home.   Past Medical History:  Diagnosis Date   ACNE ROSACEA 06/27/2009   ACTINIC KERATOSIS, HEAD 04/18/2009   Acute maxillary sinusitis 05/14/2010   ALLERGIC RHINITIS 04/09/2007   Anal fissure 04/07/2016   B12 DEFICIENCY 06/07/2007   BACK PAIN WITH RADICULOPATHY 04/24/2008   Cancer ([place])    skin   CHEST WALL PAIN, ACUTE 06/15/2009   Chronic maxillary sinusitis 05/29/2008   COLITIS 04/27/2009   COLONIC POLYPS, HX OF 04/27/2009   tubular adenomas   Coronary artery disease 05/12/2014   Cath 05/12/2014 w/ severe 3-vessel CAD and preserved LV function, EF 55%   DERMATITIS, ATOPIC 10/12/2007   DIVERTICULOSIS, COLON 04/27/2009   ECCHYMOSES, SPONTANEOUS 06/27/2008   Elevated sedimentation rate 05/02/2009   ESOPHAGEAL STRICTURE 04/27/2009   GASTRITIS, CHRONIC 04/27/2009   HYPERLIPIDEMIA 03/13/2008   HYPERTENSION 04/09/2007   Internal bleeding hemorrhoids 01/22/2015   01/22/2015 Seen at anoscopy, grade 1 all 3 positions    Irritable bowel syndrome 08/11/2007   KIDNEY DISEASE 04/09/2007   NECK PAIN 09/14/2008   NEUROPATHY, IDIOPATHIC PERIPHERAL NEC 08/11/2007   OSTEOARTHRITIS, WRIST, RIGHT 08/05/2010   Postoperative delirium 05/20/2014   S/P CABG x 4 05/19/2014   LIMA to LAD, SVG to diag, SVG to OM, SVG to PDA, EVH via right thigh and leg    Past Surgical History:  Procedure Laterality Date   CARDIAC CATHETERIZATION     COLONOSCOPY     CORONARY ARTERY BYPASS GRAFT N/A 05/19/2014   Procedure: CORONARY ARTERY BYPASS GRAFTING (CABG);  Surgeon: Rexene Alberts, MD;   Location: [place];  Service: Open Heart Surgery;  Laterality: N/A;  Times 4 using left internal mammary artery and endoscopically harvested right saphenous vein   ESOPHAGOGASTRODUODENOSCOPY     FINGER SURGERY     cut off end of finger   FLEXIBLE SIGMOIDOSCOPY     HEMORRHOID BANDING     HERNIA REPAIR     INCISION AND DRAINAGE WOUND WITH FOREIGN BODY REMOVAL Left 12/20/2013   Procedure: INCISION AND DRAINAGE LEFT INDEX FINGER;  Surgeon: Tennis Must, MD;  Location[facility];  Service: Orthopedics;  Laterality: Left;   INTRAOPERATIVE TRANSESOPHAGEAL ECHOCARDIOGRAM N/A 05/19/2014   Procedure: INTRAOPERATIVE TRANSESOPHAGEAL ECHOCARDIOGRAM;  Surgeon: Rexene Alberts, MD;  Location: [place];  Service: Open Heart Surgery;  Laterality: N/A;   lamenectomy     LEFT HEART CATHETERIZATION WITH CORONARY ANGIOGRAM N/A 05/12/2014   Procedure: LEFT HEART CATHETERIZATION WITH CORONARY ANGIOGRAM;  Surgeon: Sinclair Grooms, MD;  Location: [facility] CATH [facility];  Service: Cardiovascular;  Laterality: N/A;   LUMBAR FUSION     TONSILLECTOMY     VARICOSE VEIN SURGERY Left     Social History   Socioeconomic History   Marital status: Married    Spouse name: Not on file   Number of children: 4   Years of education: Not on file   Highest education level: Not on file  Occupational History   Occupation: retired    Fish farm manager: RETIRED  Comment: from [employer]  Tobacco Use   Smoking status: Former    Packs/day: 0.50    Years: 42.00    Pack years: 21.00    Types: Cigarettes    Quit date: 11/17/1998    Years since quitting: 22.8   Smokeless tobacco: Never  Vaping Use   Vaping Use: Never used  Substance and Sexual Activity   Alcohol use: No   Drug use: No   Sexual activity: Yes  Other Topics Concern   Not on file  Social History Narrative   Married 35 years (2nd marriage). 2 kids from previous marriage (lost a 7rd child hit by car at age 73). 2 stepchildren. 6 grandkids. 2 greatgrandkids.       Retired from [place] [employer]: walking/exercise, building in shop-furniture (cut finger off in February doing this)   6 grand children - Has bought them all a new car    Brother died of probably addiction   Sister also died; not sure of cause    He enjoys his life; Likes to play the keyboard; guitar    Had played in a gospel quartet    Enjoys working with wood    Social Determinants of Radio broadcast assistant Strain: Low Risk    Difficulty of Paying Living Expenses: Not hard at all  Food Insecurity: No Food Insecurity   Worried About Charity fundraiser in the Last Year: Never true   Arboriculturist in the Last Year: Never true  Transportation Needs: No Transportation Needs   Lack of Transportation (Medical): No   Lack of Transportation (Non-Medical): No  Physical Activity: Inactive   Days of Exercise per Week: 0 days   Minutes of Exercise per Session: 0 min  Stress: No Stress Concern Present   Feeling of Stress : Not at all  Social Connections: Moderately Isolated   Frequency of Communication with Friends and Family: More than three times a week   Frequency of Social Gatherings with Friends and Family: More than three times a week   Attends Religious Services: Never   Marine scientist or Organizations: No   Attends Music therapist: Never   Marital Status: Married  Human resources officer Violence: Not At Risk   Fear of Current or Ex-Partner: No   Emotionally Abused: No   Physically Abused: No   Sexually Abused: No    Current Outpatient Medications on File Prior to Visit  Medication Sig Dispense Refill   acetaminophen (TYLENOL) 500 MG tablet Take 500 mg by mouth daily as needed (for pain). Reported on 04/11/2016     aspirin 81 MG EC tablet Take 1 tablet (81 mg total) by mouth daily.     Multiple Vitamins-Minerals (CENTRUM SILVER PO) Take 1 tablet by mouth daily.     rosuvastatin (CRESTOR) 20 MG tablet Take 1 tablet (20 mg total) by mouth daily. 90 tablet 3    tamsulosin (FLOMAX) 0.4 MG CAPS capsule Take 0.4 mg by mouth. Prescribed by urology     No current facility-administered medications on file prior to visit.    Allergies  Allergen Reactions   Lipitor [Atorvastatin] Other (See Comments)    REACTION: nausea and blurred vision   Trazodone And Nefazodone Other (See Comments)    dizzy   Ciprofloxacin Swelling   Mycophenolate Mofetil Other (See Comments)    REACTION: unspecified   Amoxicillin Rash    Has patient had a PCN reaction  causing immediate rash, facial/tongue/throat swelling, SOB or lightheadedness with hypotension: Unknown Has patient had a PCN reaction causing severe rash involving mucus membranes or skin necrosis: Unknown Has patient had a PCN reaction that required hospitalization: Unknown Has patient had a PCN reaction occurring within the last 10 years: Unknown If all of the above answers are "NO", then may proceed with Cephalosporin use.    Penicillins Rash    Has patient had a PCN reaction causing immediate rash, facial/tongue/throat swelling, SOB or lightheadedness with hypotension: Unknown Has patient had a PCN reaction causing severe rash involving mucus membranes or skin necrosis: Unknown Has patient had a PCN reaction that required hospitalization: Unknown Has patient had a PCN reaction occurring within the last 10 years: Unknown If all of the above answers are "NO", then may proceed with Cephalosporin use.    Rosuvastatin Other (See Comments)    Unknown     Family History  Problem Relation Age of Onset   Hernia Mother    Heart disease Father        smoker   COPD Father    Heart attack Brother    Early death Daughter    Colon cancer Neg Hx     BP (!) 168/82 (BP Location: Right Arm, Patient Position: Sitting, Cuff Size: Large)   Pulse 72   Ht 6\' 2"  (1.88 m)   Wt 195 lb (88.5 kg)   SpO2 97%   BMI 25.04 kg/m    Review of Systems Denies fever    Objective:   Physical Exam VITAL SIGNS:  See vs  page GENERAL: no distress NECK: There is no palpable thyroid enlargement.  No thyroid nodule is palpable.  No palpable lymphadenopathy at the anterior neck.   Lab Results  Component Value Date   TSH 0.02 (L) 10/01/2021       Assessment & Plan:  Hyperthyroidism: uncontrolled.  increase the methimazole to 5 mg daily

## 2021-10-01 NOTE — Patient Instructions (Addendum)
Blood tests are requested for you today.  We'll let you know about the results.  If ever you have fever while taking methimazole, stop it and call us, even if the reason is obvious, because of the risk of a rare side-effect.  It is best to never miss the medication.  However, if you do miss it, next best is to double up the next time.    Please come back for a follow-up appointment in 6 months.

## 2021-10-03 ENCOUNTER — Ambulatory Visit (INDEPENDENT_AMBULATORY_CARE_PROVIDER_SITE_OTHER): Payer: Medicare Other

## 2021-10-03 ENCOUNTER — Other Ambulatory Visit: Payer: Self-pay

## 2021-10-03 VITALS — BP 130/68 | HR 88 | Temp 98.2°F | Wt 197.0 lb

## 2021-10-03 DIAGNOSIS — Z0000 Encounter for general adult medical examination without abnormal findings: Secondary | ICD-10-CM | POA: Diagnosis not present

## 2021-10-03 NOTE — Progress Notes (Addendum)
Subjective:   ADDIS TUOHY is a 85 y.o. male who presents for Medicare Annual/Subsequent preventive examination.  Review of Systems     Cardiac Risk Factors include: advanced age (>29men, >65 women);hypertension;dyslipidemia;male gender     Objective:    Today's Vitals   10/03/21 1336 10/03/21 1357  BP: 138/82 130/68  Pulse: 88   Temp: 98.2 F (36.8 C)   SpO2: 95%   Weight: 197 lb (89.4 kg)    Body mass index is 25.29 kg/m.  Advanced Directives 10/03/2021 12/31/2019 08/12/2019 06/14/2018 07/31/2017 07/31/2017 04/07/2017  Does Patient Have a Medical Advance Directive? Yes No No No No No No  Does patient want to make changes to medical advance directive? Yes (MAU/Ambulatory/Procedural Areas - Information given) - - - - - -  Would patient like information on creating a medical advance directive? - No - Patient declined Yes (MAU/Ambulatory/Procedural Areas - Information given) No - Patient declined No - Patient declined - -  Pre-existing out of facility DNR order (yellow form or pink MOST form) - - - - - - -    Current Medications (verified) Outpatient Encounter Medications as of 10/03/2021  Medication Sig   acetaminophen (TYLENOL) 500 MG tablet Take 500 mg by mouth daily as needed (for pain). Reported on 04/11/2016   aspirin 81 MG EC tablet Take 1 tablet (81 mg total) by mouth daily.   methimazole (TAPAZOLE) 5 MG tablet Take 1 tablet (5 mg total) by mouth daily.   Multiple Vitamins-Minerals (CENTRUM SILVER PO) Take 1 tablet by mouth daily.   rosuvastatin (CRESTOR) 20 MG tablet Take 1 tablet (20 mg total) by mouth daily.   tamsulosin (FLOMAX) 0.4 MG CAPS capsule Take 0.4 mg by mouth. Prescribed by urology   No facility-administered encounter medications on file as of 10/03/2021.    Allergies (verified) Lipitor [atorvastatin], Trazodone and nefazodone, Ciprofloxacin, Mycophenolate mofetil, Amoxicillin, Penicillins, and Rosuvastatin   History: Past Medical History:  Diagnosis  Date   ACNE ROSACEA 06/27/2009   ACTINIC KERATOSIS, HEAD 04/18/2009   Acute maxillary sinusitis 05/14/2010   ALLERGIC RHINITIS 04/09/2007   Anal fissure 04/07/2016   B12 DEFICIENCY 06/07/2007   BACK PAIN WITH RADICULOPATHY 04/24/2008   Cancer ([place])    skin   CHEST WALL PAIN, ACUTE 06/15/2009   Chronic maxillary sinusitis 05/29/2008   COLITIS 04/27/2009   COLONIC POLYPS, HX OF 04/27/2009   tubular adenomas   Coronary artery disease 05/12/2014   Cath 05/12/2014 w/ severe 3-vessel CAD and preserved LV function, EF 55%   DERMATITIS, ATOPIC 10/12/2007   DIVERTICULOSIS, COLON 04/27/2009   ECCHYMOSES, SPONTANEOUS 06/27/2008   Elevated sedimentation rate 05/02/2009   ESOPHAGEAL STRICTURE 04/27/2009   GASTRITIS, CHRONIC 04/27/2009   HYPERLIPIDEMIA 03/13/2008   HYPERTENSION 04/09/2007   Internal bleeding hemorrhoids 01/22/2015   01/22/2015 Seen at anoscopy, grade 1 all 3 positions    Irritable bowel syndrome 08/11/2007   KIDNEY DISEASE 04/09/2007   NECK PAIN 09/14/2008   NEUROPATHY, IDIOPATHIC PERIPHERAL NEC 08/11/2007   OSTEOARTHRITIS, WRIST, RIGHT 08/05/2010   Postoperative delirium 05/20/2014   S/P CABG x 4 05/19/2014   LIMA to LAD, SVG to diag, SVG to OM, SVG to PDA, EVH via right thigh and leg   Past Surgical History:  Procedure Laterality Date   CARDIAC CATHETERIZATION     COLONOSCOPY     CORONARY ARTERY BYPASS GRAFT N/A 05/19/2014   Procedure: CORONARY ARTERY BYPASS GRAFTING (CABG);  Surgeon: Rexene Alberts, MD;  Location: [place];  Service: Open Heart  Surgery;  Laterality: N/A;  Times 4 using left internal mammary artery and endoscopically harvested right saphenous vein   ESOPHAGOGASTRODUODENOSCOPY     FINGER SURGERY     cut off end of finger   FLEXIBLE SIGMOIDOSCOPY     HEMORRHOID BANDING     HERNIA REPAIR     INCISION AND DRAINAGE WOUND WITH FOREIGN BODY REMOVAL Left 12/20/2013   Procedure: INCISION AND DRAINAGE LEFT INDEX FINGER;  Surgeon: Tennis Must, MD;  Location[facility];  Service: Orthopedics;   Laterality: Left;   INTRAOPERATIVE TRANSESOPHAGEAL ECHOCARDIOGRAM N/A 05/19/2014   Procedure: INTRAOPERATIVE TRANSESOPHAGEAL ECHOCARDIOGRAM;  Surgeon: Rexene Alberts, MD;  Location: [place];  Service: Open Heart Surgery;  Laterality: N/A;   lamenectomy     LEFT HEART CATHETERIZATION WITH CORONARY ANGIOGRAM N/A 05/12/2014   Procedure: LEFT HEART CATHETERIZATION WITH CORONARY ANGIOGRAM;  Surgeon: Sinclair Grooms, MD;  Location: [facility];  Service: Cardiovascular;  Laterality: N/A;   LUMBAR FUSION     TONSILLECTOMY     VARICOSE VEIN SURGERY Left    Family History  Problem Relation Age of Onset   Hernia Mother    Heart disease Father        smoker   COPD Father    Heart attack Brother    Early death Daughter    Colon cancer Neg Hx    Social History   Socioeconomic History   Marital status: Married    Spouse name: Not on file   Number of children: 4   Years of education: Not on file   Highest education level: Not on file  Occupational History   Occupation: retired    Fish farm manager: RETIRED    Comment: from [place] Use   Smoking status: Former    Packs/day: 0.50    Years: 42.00    Pack years: 21.00    Types: Cigarettes    Quit date: 11/17/1998    Years since quitting: 22.8   Smokeless tobacco: Never  Vaping Use   Vaping Use: Never used  Substance and Sexual Activity   Alcohol use: No   Drug use: No   Sexual activity: Yes  Other Topics Concern   Not on file  Social History Narrative   Married 35 years (2nd marriage). 2 kids from previous marriage (lost a 52rd child hit by car at age 36). 2 stepchildren. 6 grandkids. 2 greatgrandkids.       Retired from [employer] [employer]: walking/exercise, building in shop-furniture (cut finger off in February doing this)   6 grand children - Has bought them all a new car    Brother died of probably addiction   Sister also died; not sure of cause    He enjoys his life; Likes to play the keyboard; guitar     Had played in a gospel quartet    Enjoys working with wood    Social Determinants of Radio broadcast assistant Strain: Low Risk    Difficulty of Paying Living Expenses: Not hard at all  Food Insecurity: No Food Insecurity   Worried About Charity fundraiser in the Last Year: Never true   Arboriculturist in the Last Year: Never true  Transportation Needs: No Transportation Needs   Lack of Transportation (Medical): No   Lack of Transportation (Non-Medical): No  Physical Activity: Inactive   Days of Exercise per Week: 0 days   Minutes of Exercise per Session: 0  min  Stress: No Stress Concern Present   Feeling of Stress : Not at all  Social Connections: Moderately Isolated   Frequency of Communication with Friends and Family: More than three times a week   Frequency of Social Gatherings with Friends and Family: More than three times a week   Attends Religious Services: Never   Marine scientist or Organizations: No   Attends Music therapist: Never   Marital Status: Married    Tobacco Counseling Counseling given: Not Answered   Clinical Intake:  Pre-visit preparation completed: Yes  Pain : No/denies pain     BMI - recorded: 25.29 Nutritional Status: BMI 25 -29 Overweight Nutritional Risks: None Diabetes: No     Diabetic?no  Interpreter Needed?: No  Information entered by :: Charlott Rakes, LPN   Activities of Daily Living In your present state of health, do you have any difficulty performing the following activities: 10/03/2021  Hearing? Y  Comment HOH  Vision? N  Difficulty concentrating or making decisions? N  Walking or climbing stairs? N  Dressing or bathing? N  Doing errands, shopping? N  Preparing Food and eating ? N  Using the Toilet? N  In the past six months, have you accidently leaked urine? N  Do you have problems with loss of bowel control? N  Managing your Medications? N  Managing your Finances? N  Housekeeping or  managing your Housekeeping? N  Some recent data might be hidden    Patient Care Team: [name], MD as PCP - General (Family Medicine) Belva Crome, MD as PCP - Cardiology (Cardiology) Bjorn Loser, MD as Consulting Physician (Urology) Alanda Slim, Neena Rhymes, MD as Consulting Physician (Ophthalmology) Renato Shin, MD as Consulting Physician (Endocrinology) Leonie Man, MD as Consulting Physician (Cardiology)  Indicate any recent Medical Services you may have received from other than [facility] providers in the past year (date may be approximate).     Assessment:   This is a routine wellness examination for Kun[place].  Hearing/Vision screen Hearing Screening - Comments:: Pt HOH  Vision Screening - Comments:: Pt follows up with Dr Alanda Slim for annual eye exams   Dietary issues and exercise activities discussed: Current Exercise Habits: The patient does not participate in regular exercise at present   Goals Addressed             This Visit's Progress    Patient Stated       None at this time       Depression Screen PHQ 2/9 Scores 10/03/2021 05/16/2021 01/01/2021 09/27/2020 02/24/2020 01/11/2020 08/26/2019  PHQ - 2 Score 0 0 0 0 0 0 0    Fall Risk Fall Risk  10/03/2021 05/16/2021 09/27/2020 08/29/2020 08/26/2019  Falls in the past year? 0 0 0 0 0  Number falls in past yr: 0 0 0 0 0  Injury with Fall? 0 0 0 0 0  Risk for fall due to : Impaired vision No Fall Risks Impaired vision - -  Follow up Falls prevention discussed Falls evaluation completed Falls prevention discussed - -    FALL RISK PREVENTION PERTAINING TO THE HOME:  Any stairs in or around the home? No  If so, are there any without handrails? No  Home free of loose throw rugs in walkways, pet beds, electrical cords, etc? Yes  Adequate lighting in your home to reduce risk of falls? Yes   ASSISTIVE DEVICES UTILIZED TO PREVENT FALLS:  Life alert? No  Use of a  cane, walker or w/c? No  Grab bars in the  bathroom? No  Shower chair or bench in shower? Yes  Elevated toilet seat or a handicapped toilet? No   TIMED UP AND GO:  Was the test performed? Yes .  Length of time to ambulate 10 feet: 15 sec.   Gait steady and fast without use of assistive device  Cognitive Function: MMSE - Mini Mental State Exam 11/01/2020 11/01/2020 04/07/2017  Not completed: - - (No Data)  Orientation to time 3 3 -  Orientation to Place 4 4 -  Registration 3 3 -  Attention/ Calculation 5 5 -  Recall 0 0 -  Language- name 2 objects 2 2 -  Language- repeat 1 1 -  Language- follow 3 step command 3 3 -  Language- read & follow direction 1 1 -  Write a sentence 1 1 -  Copy design 1 1 -  Total score 24 24 -     6CIT Screen 10/03/2021 06/14/2018  What Year? 4 points 0 points  What month? 0 points 0 points  What time? 0 points 0 points  Count back from 20 0 points 0 points  Months in reverse 4 points 0 points  Repeat phrase 10 points -  Total Score 18 -    Immunizations Immunization History  Administered Date(s) Administered   Fluad Quad(high Dose 65+) 07/28/2019, 08/22/2020   Influenza Split 08/11/2011, 08/05/2012   Influenza Whole 08/31/2007, 08/15/2008, 08/20/2009, 08/05/2010   Influenza, High Dose Seasonal PF 07/28/2016, 09/10/2017, 08/13/2018, 08/23/2021   Influenza,inj,Quad PF,6+ Mos 07/15/2013, 07/20/2014, 08/10/2015   Influenza,inj,quad, With Preservative 08/17/2018   PFIZER(Purple Top)SARS-COV-2 Vaccination 12/02/2019, 12/22/2019, 08/28/2020   Pneumococcal Conjugate-13 11/04/2013   Pneumococcal Polysaccharide-23 02/05/2015   Td 03/18/2007   Tdap 06/04/2018   Zoster Recombinat (Shingrix) 08/15/2019, 10/31/2019   Zoster, Live 04/16/2011    TDAP status: Up to date  Flu Vaccine status: Up to date  Pneumococcal vaccine status: Up to date  Covid-19 vaccine status: Completed vaccines  Qualifies for Shingles Vaccine? Yes   Zostavax completed Yes   Shingrix Completed?: Yes  Screening  Tests Health Maintenance  Topic Date Due   COVID-19 Vaccine (4 - Booster for Pfizer series) 10/23/2020   TETANUS/TDAP  06/04/2028   Pneumonia Vaccine 20+ Years old  Completed   INFLUENZA VACCINE  Completed   Zoster Vaccines- Shingrix  Completed   HPV VACCINES  Aged Out    Health Maintenance  Health Maintenance Due  Topic Date Due   COVID-19 Vaccine (4 - Booster for Pfizer series) 10/23/2020    Colorectal cancer screening: No longer required.    Additional Screening:  Vision Screening: Recommended annual ophthalmology exams for early detection of glaucoma and other disorders of the eye. Is the patient up to date with their annual eye exam?  Yes  Who is the provider or what is the name of the office in which the patient attends annual eye exams? Dr Alanda Slim  If pt is not established with a provider, would they like to be referred to a provider to establish care? No .   Dental Screening: Recommended annual dental exams for proper oral hygiene  Community Resource Referral / Chronic Care Management: CRR required this visit?  No   CCM required this visit?  No      Plan:     I have personally reviewed and noted the following in the patient's chart:   Medical and social history Use of alcohol, tobacco or illicit drugs  Current medications and supplements including opioid prescriptions. Patient is not currently taking opioid prescriptions. Functional ability and status Nutritional status Physical activity Advanced directives List of other physicians Hospitalizations, surgeries, and ER visits in previous 12 months Vitals Screenings to include cognitive, depression, and falls Referrals and appointments  In addition, I have reviewed and discussed with patient certain preventive protocols, quality metrics, and best practice recommendations. A written personalized care plan for preventive services as well as general preventive health recommendations were provided to patient.      Willette Brace, LPN   [id number]   Nurse Notes: None

## 2021-10-03 NOTE — Patient Instructions (Signed)
Timothy Gallagher , Thank you for taking time to come for your Medicare Wellness Visit. I appreciate your ongoing commitment to your health goals. Please review the following plan we discussed and let me know if I can assist you in the future.   Screening recommendations/referrals: Colonoscopy: No longer required  Recommended yearly ophthalmology/optometry visit for glaucoma screening and checkup Recommended yearly dental visit for hygiene and checkup  Vaccinations: Influenza vaccine: Done 08/23/21  Pneumococcal vaccine: Up to date Tdap vaccine: Done 06/04/18  Shingles vaccine: Completed 9/28, 10/31/19   Covid-19: Completed 1/11, 2/4, 08/28/20  Advanced directives: Advance directive discussed with you today. I have provided a copy for you to complete at home and have notarized. Once this is complete please bring a copy in to our office so we can scan it into your chart.  Conditions/risks identified: none at this time   Next appointment: Follow up in one year for your annual wellness visit.   Preventive Care 83 Years and Older, Male Preventive care refers to lifestyle choices and visits with your health care provider that can promote health and wellness. What does preventive care include? A yearly physical exam. This is also called an annual well check. Dental exams once or twice a year. Routine eye exams. Ask your health care provider how often you should have your eyes checked. Personal lifestyle choices, including: Daily care of your teeth and gums. Regular physical activity. Eating a healthy diet. Avoiding tobacco and drug use. Limiting alcohol use. Practicing safe sex. Taking low doses of aspirin every day. Taking vitamin and mineral supplements as recommended by your health care provider. What happens during an annual well check? The services and screenings done by your health care provider during your annual well check will depend on your age, overall health, lifestyle risk  factors, and family history of disease. Counseling  Your health care provider may ask you questions about your: Alcohol use. Tobacco use. Drug use. Emotional well-being. Home and relationship well-being. Sexual activity. Eating habits. History of falls. Memory and ability to understand (cognition). Work and work Statistician. Screening  You may have the following tests or measurements: Height, weight, and BMI. Blood pressure. Lipid and cholesterol levels. These may be checked every 5 years, or more frequently if you are over 66 years old. Skin check. Lung cancer screening. You may have this screening every year starting at age 68 if you have a 30-pack-year history of smoking and currently smoke or have quit within the past 15 years. Fecal occult blood test (FOBT) of the stool. You may have this test every year starting at age 39. Flexible sigmoidoscopy or colonoscopy. You may have a sigmoidoscopy every 5 years or a colonoscopy every 10 years starting at age 31. Prostate cancer screening. Recommendations will vary depending on your family history and other risks. Hepatitis C blood test. Hepatitis B blood test. Sexually transmitted disease (STD) testing. Diabetes screening. This is done by checking your blood sugar (glucose) after you have not eaten for a while (fasting). You may have this done every 1-3 years. Abdominal aortic aneurysm (AAA) screening. You may need this if you are a current or former smoker. Osteoporosis. You may be screened starting at age 83 if you are at high risk. Talk with your health care provider about your test results, treatment options, and if necessary, the need for more tests. Vaccines  Your health care provider may recommend certain vaccines, such as: Influenza vaccine. This is recommended every year. Tetanus, diphtheria, and acellular  pertussis (Tdap, Td) vaccine. You may need a Td booster every 10 years. Zoster vaccine. You may need this after age  72. Pneumococcal 13-valent conjugate (PCV13) vaccine. One dose is recommended after age 62. Pneumococcal polysaccharide (PPSV23) vaccine. One dose is recommended after age 36. Talk to your health care provider about which screenings and vaccines you need and how often you need them. This information is not intended to replace advice given to you by your health care provider. Make sure you discuss any questions you have with your health care provider. Document Released: 11/30/2015 Document Revised: 07/23/2016 Document Reviewed: 09/04/2015 Elsevier Interactive Patient Education  2017 [employer] Prevention in the Home Falls can cause injuries. They can happen to people of all ages. There are many things you can do to make your home safe and to help prevent falls. What can I do on the outside of my home? Regularly fix the edges of walkways and driveways and fix any cracks. Remove anything that might make you trip as you walk through a door, such as a raised step or threshold. Trim any bushes or trees on the path to your home. Use bright outdoor lighting. Clear any walking paths of anything that might make someone trip, such as rocks or tools. Regularly check to see if handrails are loose or broken. Make sure that both sides of any steps have handrails. Any raised decks and porches should have guardrails on the edges. Have any leaves, snow, or ice cleared regularly. Use sand or salt on walking paths during winter. Clean up any spills in your garage right away. This includes oil or grease spills. What can I do in the bathroom? Use night lights. Install grab bars by the toilet and in the tub and shower. Do not use towel bars as grab bars. Use non-skid mats or decals in the tub or shower. If you need to sit down in the shower, use a plastic, non-slip stool. Keep the floor dry. Clean up any water that spills on the floor as soon as it happens. Remove soap buildup in the tub or shower  regularly. Attach bath mats securely with double-sided non-slip rug tape. Do not have throw rugs and other things on the floor that can make you trip. What can I do in the bedroom? Use night lights. Make sure that you have a light by your bed that is easy to reach. Do not use any sheets or blankets that are too big for your bed. They should not hang down onto the floor. Have a firm chair that has side arms. You can use this for support while you get dressed. Do not have throw rugs and other things on the floor that can make you trip. What can I do in the kitchen? Clean up any spills right away. Avoid walking on wet floors. Keep items that you use a lot in easy-to-reach places. If you need to reach something above you, use a strong step stool that has a grab bar. Keep electrical cords out of the way. Do not use floor polish or wax that makes floors slippery. If you must use wax, use non-skid floor wax. Do not have throw rugs and other things on the floor that can make you trip. What can I do with my stairs? Do not leave any items on the stairs. Make sure that there are handrails on both sides of the stairs and use them. Fix handrails that are broken or loose. Make sure that handrails  are as long as the stairways. Check any carpeting to make sure that it is firmly attached to the stairs. Fix any carpet that is loose or worn. Avoid having throw rugs at the top or bottom of the stairs. If you do have throw rugs, attach them to the floor with carpet tape. Make sure that you have a light switch at the top of the stairs and the bottom of the stairs. If you do not have them, ask someone to add them for you. What else can I do to help prevent falls? Wear shoes that: Do not have high heels. Have rubber bottoms. Are comfortable and fit you well. Are closed at the toe. Do not wear sandals. If you use a stepladder: Make sure that it is fully opened. Do not climb a closed stepladder. Make sure that  both sides of the stepladder are locked into place. Ask someone to hold it for you, if possible. Clearly mark and make sure that you can see: Any grab bars or handrails. First and last steps. Where the edge of each step is. Use tools that help you move around (mobility aids) if they are needed. These include: Canes. Walkers. Scooters. Crutches. Turn on the lights when you go into a dark area. Replace any light bulbs as soon as they burn out. Set up your furniture so you have a clear path. Avoid moving your furniture around. If any of your floors are uneven, fix them. If there are any pets around you, be aware of where they are. Review your medicines with your doctor. Some medicines can make you feel dizzy. This can increase your chance of falling. Ask your doctor what other things that you can do to help prevent falls. This information is not intended to replace advice given to you by your health care provider. Make sure you discuss any questions you have with your health care provider. Document Released: 08/30/2009 Document Revised: 04/10/2016 Document Reviewed: 12/08/2014 Elsevier Interactive Patient Education  2017 [employer].

## 2021-10-05 ENCOUNTER — Other Ambulatory Visit: Payer: Self-pay | Admitting: Endocrinology

## 2021-10-05 DIAGNOSIS — E0590 Thyrotoxicosis, unspecified without thyrotoxic crisis or storm: Secondary | ICD-10-CM

## 2021-10-15 ENCOUNTER — Other Ambulatory Visit: Payer: Self-pay

## 2021-10-15 ENCOUNTER — Ambulatory Visit (INDEPENDENT_AMBULATORY_CARE_PROVIDER_SITE_OTHER): Payer: Medicare Other

## 2021-10-15 DIAGNOSIS — E538 Deficiency of other specified B group vitamins: Secondary | ICD-10-CM | POA: Diagnosis not present

## 2021-10-15 MED ORDER — CYANOCOBALAMIN 1000 MCG/ML IJ SOLN
1000.0000 ug | Freq: Once | INTRAMUSCULAR | Status: AC
  Administered 2021-10-15: 1000 ug via INTRAMUSCULAR

## 2021-10-15 NOTE — Progress Notes (Signed)
Pt received b12 today and tolerated it fine.

## 2021-10-18 DIAGNOSIS — R351 Nocturia: Secondary | ICD-10-CM | POA: Diagnosis not present

## 2021-11-06 ENCOUNTER — Telehealth: Payer: Self-pay

## 2021-11-06 NOTE — Telephone Encounter (Signed)
See note below

## 2021-11-06 NOTE — Telephone Encounter (Signed)
Patient son called in and is worried about the patient he thinks he is having some memory issues. Patient doesn't want his father to know he called but would like to see if [name] could do some kind of test on him next time he comes in.

## 2021-11-07 NOTE — Telephone Encounter (Signed)
Noted- not on DPR so cannot communicate back with son- I have been following patients memory and I have concern as well- he has declined neurology follow up

## 2021-11-12 ENCOUNTER — Encounter: Payer: Self-pay | Admitting: Family Medicine

## 2021-11-12 ENCOUNTER — Ambulatory Visit (INDEPENDENT_AMBULATORY_CARE_PROVIDER_SITE_OTHER): Payer: Medicare Other | Admitting: Family Medicine

## 2021-11-12 ENCOUNTER — Other Ambulatory Visit: Payer: Self-pay

## 2021-11-12 ENCOUNTER — Ambulatory Visit: Payer: Medicare Other | Admitting: Family Medicine

## 2021-11-12 VITALS — BP 140/80 | HR 52 | Temp 98.2°F | Ht 74.0 in | Wt 190.4 lb

## 2021-11-12 DIAGNOSIS — R413 Other amnesia: Secondary | ICD-10-CM | POA: Diagnosis not present

## 2021-11-12 DIAGNOSIS — E538 Deficiency of other specified B group vitamins: Secondary | ICD-10-CM | POA: Diagnosis not present

## 2021-11-12 DIAGNOSIS — R42 Dizziness and giddiness: Secondary | ICD-10-CM

## 2021-11-12 MED ORDER — CYANOCOBALAMIN 1000 MCG/ML IJ SOLN
1000.0000 ug | Freq: Once | INTRAMUSCULAR | Status: AC
  Administered 2021-11-12: 16:00:00 1000 ug via INTRAMUSCULAR

## 2021-11-12 NOTE — Progress Notes (Signed)
Subjective:     Patient ID: Timothy Gallagher, male    DOB: 1936/02/25, 85 y.o.   MRN: [id number]  Chief Complaint  Patient presents with   Hypotension    Blood pressure has been running low, started 3 days ago  get dizzy at times    HPI Came in to get "shot" .  Not sure why he's being seen today. Dizzy last wk-bp's were "low" But got better now.  Lasted 1-2 days.  He's not sure of his meds.   B12 deficiency-getting monthly injections.  Last 10/15/21. no n/t. No balance problems now but has had in past.  Long time-memory trouble. Not getting lost driving. Not forgetting names of people he knows.  Wife has done finances long time.  (There was a message from son re memory-has appt w/PCP 1/11)  There are no preventive care reminders to display for this patient.  Past Medical History:  Diagnosis Date   ACNE ROSACEA 06/27/2009   ACTINIC KERATOSIS, HEAD 04/18/2009   Acute maxillary sinusitis 05/14/2010   ALLERGIC RHINITIS 04/09/2007   Anal fissure 04/07/2016   B12 DEFICIENCY 06/07/2007   BACK PAIN WITH RADICULOPATHY 04/24/2008   Cancer ([place])    skin   CHEST WALL PAIN, ACUTE 06/15/2009   Chronic maxillary sinusitis 05/29/2008   COLITIS 04/27/2009   COLONIC POLYPS, HX OF 04/27/2009   tubular adenomas   Coronary artery disease 05/12/2014   Cath 05/12/2014 w/ severe 3-vessel CAD and preserved LV function, EF 55%   DERMATITIS, ATOPIC 10/12/2007   DIVERTICULOSIS, COLON 04/27/2009   ECCHYMOSES, SPONTANEOUS 06/27/2008   Elevated sedimentation rate 05/02/2009   ESOPHAGEAL STRICTURE 04/27/2009   GASTRITIS, CHRONIC 04/27/2009   HYPERLIPIDEMIA 03/13/2008   HYPERTENSION 04/09/2007   Internal bleeding hemorrhoids 01/22/2015   01/22/2015 Seen at anoscopy, grade 1 all 3 positions    Irritable bowel syndrome 08/11/2007   KIDNEY DISEASE 04/09/2007   NECK PAIN 09/14/2008   NEUROPATHY, IDIOPATHIC PERIPHERAL NEC 08/11/2007   OSTEOARTHRITIS, WRIST, RIGHT 08/05/2010   Postoperative delirium 05/20/2014   S/P CABG x 4  05/19/2014   LIMA to LAD, SVG to diag, SVG to OM, SVG to PDA, EVH via right thigh and leg    Past Surgical History:  Procedure Laterality Date   CARDIAC CATHETERIZATION     COLONOSCOPY     CORONARY ARTERY BYPASS GRAFT N/A 05/19/2014   Procedure: CORONARY ARTERY BYPASS GRAFTING (CABG);  Surgeon: Rexene Alberts, MD;  Location: [facility];  Service: Open Heart Surgery;  Laterality: N/A;  Times 4 using left internal mammary artery and endoscopically harvested right saphenous vein   ESOPHAGOGASTRODUODENOSCOPY     FINGER SURGERY     cut off end of finger   FLEXIBLE SIGMOIDOSCOPY     HEMORRHOID BANDING     HERNIA REPAIR     INCISION AND DRAINAGE WOUND WITH FOREIGN BODY REMOVAL Left 12/20/2013   Procedure: INCISION AND DRAINAGE LEFT INDEX FINGER;  Surgeon: Tennis Must, MD;  Location: [facility];  Service: Orthopedics;  Laterality: Left;   INTRAOPERATIVE TRANSESOPHAGEAL ECHOCARDIOGRAM N/A 05/19/2014   Procedure: INTRAOPERATIVE TRANSESOPHAGEAL ECHOCARDIOGRAM;  Surgeon: Rexene Alberts, MD;  Location: [facility];  Service: Open Heart Surgery;  Laterality: N/A;   lamenectomy     LEFT HEART CATHETERIZATION WITH CORONARY ANGIOGRAM N/A 05/12/2014   Procedure: LEFT HEART CATHETERIZATION WITH CORONARY ANGIOGRAM;  Surgeon: Sinclair Grooms, MD;  Location: [facility];  Service: Cardiovascular;  Laterality: N/A;   LUMBAR FUSION     TONSILLECTOMY  VARICOSE VEIN SURGERY Left     Outpatient Medications Prior to Visit  Medication Sig Dispense Refill   acetaminophen (TYLENOL) 500 MG tablet Take 500 mg by mouth daily as needed (for pain). Reported on 04/11/2016     aspirin 81 MG EC tablet Take 1 tablet (81 mg total) by mouth daily.     methimazole (TAPAZOLE) 5 MG tablet Take 1 tablet (5 mg total) by mouth daily. 90 tablet 3   Multiple Vitamins-Minerals (CENTRUM SILVER PO) Take 1 tablet by mouth daily.     rosuvastatin (CRESTOR) 20 MG tablet Take 1 tablet (20 mg total) by mouth daily. 90 tablet 3   tamsulosin (FLOMAX) 0.4  MG CAPS capsule Take 0.4 mg by mouth. Prescribed by urology     No facility-administered medications prior to visit.    Allergies  Allergen Reactions   Lipitor [Atorvastatin] Other (See Comments)    REACTION: nausea and blurred vision   Trazodone And Nefazodone Other (See Comments)    dizzy   Ciprofloxacin Swelling   Mycophenolate Mofetil Other (See Comments)    REACTION: unspecified   Amoxicillin Rash    Has patient had a PCN reaction causing immediate rash, facial/tongue/throat swelling, SOB or lightheadedness with hypotension: Unknown Has patient had a PCN reaction causing severe rash involving mucus membranes or skin necrosis: Unknown Has patient had a PCN reaction that required hospitalization: Unknown Has patient had a PCN reaction occurring within the last 10 years: Unknown If all of the above answers are "NO", then may proceed with Cephalosporin use.    Penicillins Rash    Has patient had a PCN reaction causing immediate rash, facial/tongue/throat swelling, SOB or lightheadedness with hypotension: Unknown Has patient had a PCN reaction causing severe rash involving mucus membranes or skin necrosis: Unknown Has patient had a PCN reaction that required hospitalization: Unknown Has patient had a PCN reaction occurring within the last 10 years: Unknown If all of the above answers are "NO", then may proceed with Cephalosporin use.    Rosuvastatin Other (See Comments)    Unknown    MWU:[id number] except as noted in HPI      Objective:     BP 140/80    Pulse (!) 52    Temp 98.2 F (36.8 C) (Temporal)    Ht 6\' 2"  (1.88 m)    Wt 190 lb 6 oz (86.4 kg)    SpO2 99%    BMI 24.44 kg/m  Wt Readings from Last 3 Encounters:  11/12/21 190 lb 6 oz (86.4 kg)  10/03/21 197 lb (89.4 kg)  10/01/21 195 lb (88.5 kg)        Gen: WDWN NAD elderly male HEENT: NCAT, conjunctiva not injected, sclera nonicteric EOMI NECK:  supple, no thyromegaly, no nodes, no carotid  bruits CARDIAC: RRR, S1S2+, no murmur.  LUNGS: CTAB. No wheezes EXT:  no edema MSK: no gross abnormalities.  NEURO: A&O x3.  CN II-XII intact. Can't recall president.  Can't recall it was just Christmas(but got correct). Clock draw ok except hands for 2:45-at 2 and between 4 and 5.  Animals- able to get 10 w/some prompting.   PSYCH: normal mood. Good eye contact  Assessment & Plan:   Problem List Items Addressed This Visit       Other   B12 deficiency - Primary   Other Visit Diagnoses     Memory impairment       Dizziness          B12 deficiency-got injection today.  Memory impairment-some difficulty naming animals and w/clock draw-will defer further testing to PCP. Dizziness-per pt, resolved.  Orthostatics does drop.  Doesn't feel it now.  Not sure if taking tamsulosin or not.  Advised to drink more fluids.  Has f/u pcp in 2 weeks.   No orders of the defined types were placed in this encounter.   Wellington Hampshire., MD

## 2021-11-12 NOTE — Patient Instructions (Signed)
It was very nice to see you today!  Keep active.  Monitor blood pressures
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
## 2021-11-21 NOTE — Telephone Encounter (Signed)
See below
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
## 2021-11-27 ENCOUNTER — Other Ambulatory Visit: Payer: Self-pay

## 2021-11-27 ENCOUNTER — Ambulatory Visit (INDEPENDENT_AMBULATORY_CARE_PROVIDER_SITE_OTHER): Payer: Medicare Other | Admitting: Family Medicine

## 2021-11-27 ENCOUNTER — Encounter: Payer: Self-pay | Admitting: Family Medicine

## 2021-11-27 VITALS — BP 136/86 | HR 74 | Temp 98.0°F | Ht 74.0 in | Wt 193.7 lb

## 2021-11-27 DIAGNOSIS — R413 Other amnesia: Secondary | ICD-10-CM | POA: Diagnosis not present

## 2021-11-27 DIAGNOSIS — R739 Hyperglycemia, unspecified: Secondary | ICD-10-CM | POA: Diagnosis not present

## 2021-11-27 DIAGNOSIS — I714 Abdominal aortic aneurysm, without rupture, unspecified: Secondary | ICD-10-CM | POA: Insufficient documentation

## 2021-11-27 DIAGNOSIS — E785 Hyperlipidemia, unspecified: Secondary | ICD-10-CM

## 2021-11-27 DIAGNOSIS — I25810 Atherosclerosis of coronary artery bypass graft(s) without angina pectoris: Secondary | ICD-10-CM | POA: Diagnosis not present

## 2021-11-27 DIAGNOSIS — E0590 Thyrotoxicosis, unspecified without thyrotoxic crisis or storm: Secondary | ICD-10-CM | POA: Diagnosis not present

## 2021-11-27 DIAGNOSIS — J449 Chronic obstructive pulmonary disease, unspecified: Secondary | ICD-10-CM

## 2021-11-27 DIAGNOSIS — I7143 Infrarenal abdominal aortic aneurysm, without rupture: Secondary | ICD-10-CM

## 2021-11-27 LAB — HEMOGLOBIN A1C: Hgb A1c MFr Bld: 6.4 % (ref 4.6–6.5)

## 2021-11-27 LAB — COMPREHENSIVE METABOLIC PANEL
ALT: 10 U/L (ref 0–53)
AST: 16 U/L (ref 0–37)
Albumin: 3.8 g/dL (ref 3.5–5.2)
Alkaline Phosphatase: 69 U/L (ref 39–117)
BUN: 14 mg/dL (ref 6–23)
CO2: 28 mEq/L (ref 19–32)
Calcium: 9.1 mg/dL (ref 8.4–10.5)
Chloride: 107 mEq/L (ref 96–112)
Creatinine, Ser: 0.91 mg/dL (ref 0.40–1.50)
GFR: 77.99 mL/min (ref >60.00)
Glucose, Bld: 89 mg/dL (ref 70–99)
Potassium: 4.5 mEq/L (ref 3.5–5.1)
Sodium: 143 mEq/L (ref 135–145)
Total Bilirubin: 0.6 mg/dL (ref 0.2–1.2)
Total Protein: 6.5 g/dL (ref 6.0–8.3)

## 2021-11-27 LAB — TSH: TSH: 0.24 u[IU]/mL — ABNORMAL LOW (ref 0.35–5.50)

## 2021-11-27 LAB — T4, FREE: Free T4: 0.84 ng/dL (ref 0.60–1.60)

## 2021-11-27 LAB — LDL CHOLESTEROL, DIRECT: Direct LDL: 75 mg/dL

## 2021-11-27 NOTE — Patient Instructions (Addendum)
I am concerned about your memory- worsening from last check. Furhter workup needed We will call you within two weeks about your referral to neurology. If you do not hear within 2 weeks, give [place] a call.  We will call you within two weeks about your referral to MRI Brain. If you do not hear within 2 weeks, give [place] a call.   Encourage you to share my concern about memory with family. Sounds like you are not having issues with driving yet such as getting lost but if this were to occur we would need to look at pulling back on driving.   Recommended follow up: Return in about 3 months (around 02/25/2022) for follow up- or sooner if needed. -hopefully you see neurology prior to this visit but can take a few months to get in

## 2021-11-27 NOTE — Progress Notes (Signed)
Phone [phone] In person visit   Subjective:   Timothy Gallagher is a 86 y.o. year old very pleasant male patient who presents for/with See problem oriented charting Chief Complaint  Patient presents with   Follow-up   Hypertension   memory issues (per daughter)    This visit occurred during the SARS-CoV-2 public health emergency.  Safety protocols were in place, including screening questions prior to the visit, additional usage of staff PPE, and extensive cleaning of exam room while observing appropriate contact time as indicated for disinfecting solutions.   Past Medical History-  Patient Active Problem List   Diagnosis Date Noted   Hyperthyroidism 01/13/2018    Priority: High   Detached retina, left 06/30/2017    Priority: High   Infrarenal abdominal aortic aneurysm (AAA) without rupture 05/12/2017    Priority: High   CAD (coronary artery disease) of artery bypass graft 05/19/2014    Priority: High   Memory loss 11/01/2020    Priority: Medium    History of atrial fibrillation 08/26/2019    Priority: Medium    BPPV (benign paroxysmal positional vertigo) 09/01/2016    Priority: Medium    Insomnia 11/22/2014    Priority: Medium    Anemia 07/20/2014    Priority: Medium    BPH (benign prostatic hyperplasia) 12/05/2013    Priority: Medium    Hyperlipidemia 03/13/2008    Priority: Medium    B12 deficiency 06/07/2007    Priority: Medium    Essential hypertension 04/09/2007    Priority: Medium    Chronic obstructive pulmonary disease ([place]) 08/26/2019    Priority: Low   Left sided abdominal pain 04/15/2017    Priority: Low   Low back pain 02/06/2017    Priority: Low   Anal fissure 04/07/2016    Priority: Low   Internal and external bleeding hemorrhoids 01/22/2015    Priority: Low   Palpitations 02/11/2013    Priority: Low   OSTEOARTHRITIS, WRIST, RIGHT 08/05/2010    Priority: Low   ACNE ROSACEA 06/27/2009    Priority: Low   ESOPHAGEAL STRICTURE 04/27/2009     Priority: Low   ACTINIC KERATOSIS, HEAD 04/18/2009    Priority: Low   NECK PAIN 09/14/2008    Priority: Low   NEUROPATHY, IDIOPATHIC PERIPHERAL NEC 08/11/2007    Priority: Low   Irritable bowel syndrome 08/11/2007    Priority: Low   ALLERGIC RHINITIS 04/09/2007    Priority: Low   Abdominal aortic aneurysm (AAA) without rupture, unspecified part 11/27/2021    Medications- reviewed and updated Current Outpatient Medications  Medication Sig Dispense Refill   acetaminophen (TYLENOL) 500 MG tablet Take 500 mg by mouth daily as needed (for pain). Reported on 04/11/2016     aspirin 81 MG EC tablet Take 1 tablet (81 mg total) by mouth daily.     methimazole (TAPAZOLE) 5 MG tablet Take 1 tablet (5 mg total) by mouth daily. 90 tablet 3   Multiple Vitamins-Minerals (CENTRUM SILVER PO) Take 1 tablet by mouth daily.     rosuvastatin (CRESTOR) 20 MG tablet Take 1 tablet (20 mg total) by mouth daily. 90 tablet 3   tamsulosin (FLOMAX) 0.4 MG CAPS capsule Take 0.4 mg by mouth. Prescribed by urology     No current facility-administered medications for this visit.     Objective:  BP 136/86    Pulse 74    Temp 98 F (36.7 C)    Ht 6\' 2"  (1.88 m)    Wt 193 lb 11.2 oz (  87.9 kg)    BMI 24.87 kg/m  Gen: NAD, resting comfortably CV: RRR no murmurs rubs or gallops Lungs: CTAB no crackles, wheeze, rhonchi Ext: no edema Skin: warm, dry Neuro: 20/30 MMSE    Assessment and Plan   # Memory loss S: Wife reported memory loss concern 08/06/2020.  MMSE at that time 25/30 with high school education.  We referred him to neurology but he ultimately opted out.  B12 was adequate June 2022-he is on injections every 5 weeks.  TSH levels are monitored by endocrine.  Not sexually active outside of marriage.  Has declined depressed mood.  Chronic small vessel ischemic disease on head CT 12/31/2019-stroke could potentially contribute to memory loss.  Other family members have reached out with concern about memory as well.   At times he will get agitated and even use aggressive language- he denies this today   On recheck 11/01/2020 was 24 out of 30.  Repeat today at 20/30- team will log A/P: worsening memory loss- concern for dementia  -Discussed MRI of the brain-he agrees for now  -Discussed referral back to neurologyhe agrees for now -both bordered   #CAD- with history of CABG in 2015 - follows with Dr. Daneen Schick #hyperlipidemia - LDL goal under 70 S: Medication:atorvastatin 20 mg--> rosuvastatin 20 mg, aspirin 81 mg   Lab Results  Component Value Date   CHOL 139 05/16/2021   HDL 47.50 05/16/2021   LDLCALC 72 05/16/2021   LDLDIRECT 91.0 05/23/2016   TRIG 98.0 05/16/2021   CHOLHDL 3 05/16/2021   A/P: with change to rosuvastatin hoping LDL improved (also does not cross blood brain barrier as much as atorvastatin in regards to memory issues)- update LDL with labs today   No chest pain or shortness of breath- asymptomatic from CAD_ continue current meds  %hyperthyroidism- follows with Dr. Loanne Drilling S: compliant On thyroid medication- methimazole 5 mg daily- increased last visit Lab Results  Component Value Date   TSH 0.02 (L) 10/01/2021  A/P:hopefully improved- update tsh and t4 and encourage Dr. Loanne Drilling follow up if levels are off   #hypertension/orthostatic hypotension S: medication:  none -orthostatic intolerance on ARB and beta blocker in past- and BP has trended down. Still with some orthostatic issues on tamsulosin but bad UTI off meds A/P: controlled on repeat without meds- continue current meds  # AAA S:Stable 3.3 cm infrarenal AAA on 01/24/20- repeat 3 years planned  A/P: has been stable- update next year   # BPH S:history of severe UTI leading to hospitalization. Orthostatic issues but feel we have to leave him on flomax  A/P: reasonably stable- continue current meds   # B12 deficiency S: Current treatment/medication (oral vs. IM):  1071mcg injections monthly- just had on 11/12/21 A/P:  not due yet- continue to monitor   #history of left retina detachment- permanent loss of vision - no recent worsening  # Hyperglycemia/insulin resistance/prediabetes- peak a1c of 6.4 S:  Medication:  none Lab Results  Component Value Date   HGBA1C 6.4 05/16/2021   A/P: update a1c- prefers to minimize meds  Recommended follow up: Return in about 3 months (around 02/25/2022) for follow up- or sooner if needed. Future Appointments  Date Time Provider [place]  04/01/2022 10:00 AM Renato Shin, MD [facility] None  10/16/2022  1:45 PM [facility] HEALTH COACH [facility]   Lab/Order associations:   ICD-10-CM   1. Memory loss  R41.3 MR Brain Wo Contrast    Ambulatory referral to Neurology  2. Hyperthyroidism  E05.90 TSH    T4, free    3. Hyperlipidemia, unspecified hyperlipidemia type  E78.5 LDL cholesterol, direct    Comprehensive metabolic panel    4. Coronary artery disease involving coronary bypass graft of native heart without angina pectoris  I25.810 LDL cholesterol, direct    Comprehensive metabolic panel    5. Hyperglycemia  R73.9 Hemoglobin A1c    6. Chronic obstructive pulmonary disease, unspecified COPD type (HCC) Chronic J44.9     7. Infrarenal abdominal aortic aneurysm (AAA) without rupture  I71.43       No orders of the defined types were placed in this encounter.   Return precautions advised.  [name], MD

## 2021-11-28 ENCOUNTER — Ambulatory Visit: Payer: Medicare Other | Admitting: Physician Assistant

## 2021-11-28 ENCOUNTER — Encounter: Payer: Self-pay | Admitting: Physician Assistant

## 2021-11-28 VITALS — BP 137/67 | HR 77 | Resp 18 | Ht 74.0 in | Wt 193.0 lb

## 2021-11-28 DIAGNOSIS — R413 Other amnesia: Secondary | ICD-10-CM

## 2021-11-28 DIAGNOSIS — F0150 Vascular dementia without behavioral disturbance: Secondary | ICD-10-CM

## 2021-11-28 DIAGNOSIS — F01B3 Vascular dementia, moderate, with mood disturbance: Secondary | ICD-10-CM | POA: Insufficient documentation

## 2021-11-28 MED ORDER — DONEPEZIL HCL 10 MG PO TABS: ORAL_TABLET | ORAL | 11 refills | Status: DC

## 2021-11-28 NOTE — Progress Notes (Addendum)
Assessment/Plan:   Timothy Gallagher is a very pleasant 86 y.o. year old RH male with risk factors including  age, hypertension, hyperlipidemia, B12 deficiency, orthostatic hypotension, hyperthyroidism, seen today for evaluation of memory loss suspicious for mild to moderate dementia without behavioral disturbance. MoCA today 14/30 with deficiency in memory, attention, language, abstraction, delayed recall 0/5, orientation 2/6.  MRI has been ordered by PCP, awaiting results.  B12 is therapeutic, thyroid is very low and is being followed by Endocrine.    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  Start Donepezil 10 mg :Take half tablet (5 mg) daily for 2 weeks, then increase to the full tablet at 10 mg daily. Side effects discussed   Agree with B12 injections by PCP Needs to follow-up on his abnormal thyroid levels with endocrine Discussed the importance of regular daily schedule with inclusion of crossword puzzles to maintain brain function.  Continue to monitor mood with PCP.  Stay active at least 30 minutes at least 3 times a week.  Naps should be scheduled and should be no longer than 60 minutes and should not occur after 2 PM.  Control cardiovascular risk factors  Mediterranean diet is recommended  Folllow up once results above are available   Subjective:    The patient is seen in neurologic consultation at the request of [name], [name]  The patient is accompanied by his wife who supplements the history. This is a 86 y.o. year old RH  male who has had memory issues for about 2 years, although he denies any symptoms.  Apparently, he had been referred by PCP for evaluation in September 2021, but he never told his wife about it, and  in today's visit, he adamantly refuses that he has any memory problems.  His wife reports that he is repeating the same stories and asking the same questions and this has been worse over the last 6 months.  Sometimes he does not remember where he has left objects.  He continues to drive only with his wife by his side, she denies that the patient is disoriented but she reports that he only drives short distances.  He denies having any irritability or mood changes, but his wife rolls her eyes after he states that he is "happy-go-lucky person".  Denies any depression.  He admits to not sleeping well because he has to get up around 3 times at night to urinate.  He denies any sleepwalking, REM behavior, hallucinations or paranoia.  He is independent of bathing and dressing, and his wife denies any hygiene concerns.  His medications are in a pillbox, but he forgets to take several doses.  His wife is in charge of the finances for the last 5 years.  His appetite is "too good "and denies trouble swallowing.  He does not cook.  He ambulates without difficulty, he has some chronic right leg pain due to arthritis, and is going to be seen by orthopedics soon.  He denies any falls or head injuries.  He does not use a cane to ambulate.  He denies a history of seizure, headaches, double vision, focal numbness or tingling, unilateral weakness, tremors or anosmia.  He has been evaluated for dizziness in view of orthostatic hypotension. He denies any constipation  or diarrhea.  He denies a history of sleep apnea, alcohol or tobacco.  Family history negative for Alzheimer's disease.  He has high School education.  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  Last CT of the head on 12/31/2019 showed chronic small vessel ischemic changes.  A1c 6.4 TSH 0.24 with free T4 0.84, LDL normal at 75, chemistries normal, B12 normal.  MRI of the brain  pending   Allergies  Allergen Reactions   Lipitor [Atorvastatin] Other (See Comments)    REACTION: nausea and blurred vision   Trazodone And Nefazodone Other (See Comments)    dizzy   Ciprofloxacin Swelling   Mycophenolate Mofetil Other (See Comments)    REACTION: unspecified   Amoxicillin Rash    Has patient had a PCN reaction causing immediate rash, facial/tongue/throat swelling, SOB or lightheadedness with hypotension: Unknown Has patient had a PCN reaction causing severe rash involving mucus membranes or skin necrosis: Unknown Has patient had a PCN reaction that required hospitalization: Unknown Has patient had a PCN reaction occurring within the last 10 years: Unknown If all of the above answers are "NO", then may proceed with Cephalosporin use.    Penicillins Rash    Has patient had a PCN reaction causing immediate rash, facial/tongue/throat swelling, SOB or lightheadedness with hypotension: Unknown Has patient had a PCN reaction causing severe rash involving mucus membranes or skin necrosis: Unknown Has patient had a PCN reaction that required hospitalization: Unknown Has patient had a PCN reaction occurring within the last 10 years: Unknown If all of the above answers are "NO", then may proceed with Cephalosporin use.    Rosuvastatin Other (See Comments)    Unknown     Current Outpatient Medications  Medication Instructions   acetaminophen (TYLENOL) 500 mg, Oral, Daily PRN, Reported on 04/11/2016   aspirin 81 mg, Oral, Daily   methimazole (TAPAZOLE) 5 mg, Oral, Daily   Multiple Vitamins-Minerals (CENTRUM SILVER PO) 1 tablet, Oral, Daily   rosuvastatin (CRESTOR) 20 mg, Oral, Daily   tamsulosin (FLOMAX) 0.4 mg, Oral, Prescribed by urology      VITALS:   Vitals:   11/28/21 1327  BP: 137/67  Pulse: 77  Resp: 18  SpO2: 95%  Weight: 193 lb (87.5 kg)  Height: 6\' 2"  (1.88 m)   Depression screen [facility] 2/9 10/03/2021 05/16/2021 01/01/2021 09/27/2020 02/24/2020  Decreased  Interest 0 0 0 0 0  Down, Depressed, Hopeless 0 0 0 0 0  PHQ - 2 Score 0 0 0 0 0  Some recent data might be hidden    PHYSICAL EXAM   HEENT:  Normocephalic, atraumatic. The mucous membranes are moist. The superficial temporal arteries are without ropiness or tenderness. Cardiovascular: Regular rate and rhythm. Lungs: Clear to auscultation bilaterally. Neck: There are no carotid bruits noted bilaterally.  NEUROLOGICAL: No flowsheet data found. MMSE - Mini Mental State Exam 11/27/2021 11/01/2020 11/01/2020  Not completed: - - -  Orientation to time 2 3 3   Orientation to Place 4 4 4   Registration 3 3 3   Attention/ Calculation 3 5 5   Recall 0 0 0  Language- name 2 objects 2 2 2   Language- repeat 1 1 1   Language- follow 3 step command 3 3 3   Language- read & follow direction 1 1 1   Write a sentence 1 1 1   Copy design 0 1 1  Total score 20 24 24  No flowsheet data found.   Orientation:  Alert and oriented to person, place and time. No aphasia or dysarthria. Fund of knowledge is appropriate. Recent memory impaired and remote memory intact.  Attention and concentration are normal.  Able to name objects and repeat phrases. Delayed recall  /5 Cranial nerves: There is good facial symmetry. Extraocular muscles are intact and visual fields are full to confrontational testing. Speech is fluent and clear. Soft palate rises symmetrically and there is no tongue deviation. Hearing is intact to conversational tone. Tone: Tone is good throughout. Sensation: Sensation is intact to light touch and pinprick throughout. Vibration is intact at the bilateral big toe.There is no extinction with double simultaneous stimulation. There is no sensory dermatomal level identified. Coordination: The patient has no difficulty with RAM's or FNF bilaterally. Normal finger to nose  Motor: Strength is 5/5 in the bilateral upper and lower extremities. There is no pronator drift. There are no fasciculations  noted. DTR's: Deep tendon reflexes are 2/4 at the bilateral biceps, triceps, brachioradialis, patella and achilles.  Plantar responses are downgoing bilaterally. Gait and Station: The patient is able to ambulate without difficulty.The patient is able to heel toe walk without any difficulty.The patient is able to ambulate in a tandem fashion. The patient is able to stand in the Romberg position.     Thank you for allowing [place] the opportunity to participate in the care of this nice patient. Please do not hesitate to contact us for any questions or concerns.   Total time spent on today's visit was 60 minutes, including both face-to-face time and nonface-to-face time.  Time included that spent on review of records (prior notes available to me/labs/imaging if pertinent), discussing treatment and goals, answering patient's questions and coordinating care.  Cc:  [name], MD  Sharene Butters 11/28/2021 1:34 PM

## 2021-11-28 NOTE — Patient Instructions (Signed)
It was a pleasure to see you today at our office.   Recommendations:  Follow up in 3  months We will start donepezil half tablet (5mg ) daily for 2  weeks.  If you are tolerating the medication, then after 2 weeks, we will increase the dose to a full tablet of 10 mg daily.  Side effects include nausea, vomiting, diarrhea, vivid dreams, and muscle cramps.  Please call the clinic if you experience any of these symptoms.  Await for the results of the MRI for further recommendations  Make sure you visit the thyroid doctor for adjustment of the medicines    RECOMMENDATIONS FOR ALL PATIENTS WITH MEMORY PROBLEMS: 1. Continue to exercise (Recommend 30 minutes of walking everyday, or 3 hours every week) 2. Increase social interactions - continue going to [place] and enjoy social gatherings with friends and family 3. Eat healthy, avoid fried foods and eat more fruits and vegetables 4. Maintain adequate blood pressure, blood sugar, and blood cholesterol level. Reducing the risk of stroke and cardiovascular disease also helps promoting better memory. 5. Avoid stressful situations. Live a simple life and avoid aggravations. Organize your time and prepare for the next day in anticipation. 6. Sleep well, avoid any interruptions of sleep and avoid any distractions in the bedroom that may interfere with adequate sleep quality 7. Avoid sugar, avoid sweets as there is a strong link between excessive sugar intake, diabetes, and cognitive impairment We discussed the Mediterranean diet, which has been shown to help patients reduce the risk of progressive memory disorders and reduces cardiovascular risk. This includes eating fish, eat fruits and green leafy vegetables, nuts like almonds and hazelnuts, walnuts, and also use olive oil. Avoid fast foods and fried foods as much as possible. Avoid sweets and sugar as sugar use has been linked to worsening of memory function.  There is always a concern of gradual progression  of memory problems. If this is the case, then we may need to adjust level of care according to patient needs. Support, both to the patient and caregiver, should then be put into place.    FALL PRECAUTIONS: Be cautious when walking. Scan the area for obstacles that may increase the risk of trips and falls. When getting up in the mornings, sit up at the edge of the bed for a few minutes before getting out of bed. Consider elevating the bed at the head end to avoid drop of blood pressure when getting up. Walk always in a well-lit room (use night lights in the walls). Avoid area rugs or power cords from appliances in the middle of the walkways. Use a walker or a cane if necessary and consider physical therapy for balance exercise. Get your eyesight checked regularly.  FINANCIAL OVERSIGHT: Supervision, especially oversight when making financial decisions or transactions is also recommended.  HOME SAFETY: Consider the safety of the kitchen when operating appliances like stoves, microwave oven, and blender. Consider having supervision and share cooking responsibilities until no longer able to participate in those. Accidents with firearms and other hazards in the house should be identified and addressed as well.   ABILITY TO BE LEFT ALONE: If patient is unable to contact 911 operator, consider using LifeLine, or when the need is there, arrange for someone to stay with patients. Smoking is a fire hazard, consider supervision or cessation. Risk of wandering should be assessed by caregiver and if detected at any point, supervision and safe proof recommendations should be instituted.  MEDICATION SUPERVISION: Inability to self-administer  medication needs to be constantly addressed. Implement a mechanism to ensure safe administration of the medications.   DRIVING: Regarding driving, in patients with progressive memory problems, driving will be impaired. We advise to have someone else do the driving if trouble  finding directions or if minor accidents are reported. Independent driving assessment is available to determine safety of driving.   If you are interested in the driving assessment, you can contact the following:  [employer] in [place]  [place] [phone]  [place]  [facility] [phone] or [phone]

## 2021-12-10 ENCOUNTER — Ambulatory Visit: Payer: Medicare Other | Admitting: Endocrinology

## 2021-12-10 ENCOUNTER — Other Ambulatory Visit: Payer: Self-pay

## 2021-12-10 VITALS — BP 150/84 | HR 81 | Ht 74.0 in | Wt 191.2 lb

## 2021-12-10 DIAGNOSIS — E0590 Thyrotoxicosis, unspecified without thyrotoxic crisis or storm: Secondary | ICD-10-CM | POA: Diagnosis not present

## 2021-12-10 NOTE — Progress Notes (Signed)
Subjective:    Patient ID: Timothy Gallagher, male    DOB: 01-18-1936, 86 y.o.   MRN: [id number]  HPI Pt returns for f/u of hyperthyroidism (dx'ed early 2019; TFT improved initially, but recurred in July, 2019, and was started on tapazole; he has never had thyroid imaging; he takes toprol for CAD, not thyroid; he declines RAI).  pt states he feels no different, and well in general.  Specifically, he denies palpitations and tremor.  BP is normal at home.   Past Medical History:  Diagnosis Date   ACNE ROSACEA 06/27/2009   ACTINIC KERATOSIS, HEAD 04/18/2009   Acute maxillary sinusitis 05/14/2010   ALLERGIC RHINITIS 04/09/2007   Anal fissure 04/07/2016   B12 DEFICIENCY 06/07/2007   BACK PAIN WITH RADICULOPATHY 04/24/2008   Cancer ([place])    skin   CHEST WALL PAIN, ACUTE 06/15/2009   Chronic maxillary sinusitis 05/29/2008   COLITIS 04/27/2009   COLONIC POLYPS, HX OF 04/27/2009   tubular adenomas   Coronary artery disease 05/12/2014   Cath 05/12/2014 w/ severe 3-vessel CAD and preserved LV function, EF 55%   DERMATITIS, ATOPIC 10/12/2007   DIVERTICULOSIS, COLON 04/27/2009   ECCHYMOSES, SPONTANEOUS 06/27/2008   Elevated sedimentation rate 05/02/2009   ESOPHAGEAL STRICTURE 04/27/2009   GASTRITIS, CHRONIC 04/27/2009   HYPERLIPIDEMIA 03/13/2008   HYPERTENSION 04/09/2007   Internal bleeding hemorrhoids 01/22/2015   01/22/2015 Seen at anoscopy, grade 1 all 3 positions    Irritable bowel syndrome 08/11/2007   KIDNEY DISEASE 04/09/2007   NECK PAIN 09/14/2008   NEUROPATHY, IDIOPATHIC PERIPHERAL NEC 08/11/2007   OSTEOARTHRITIS, WRIST, RIGHT 08/05/2010   Postoperative delirium 05/20/2014   S/P CABG x 4 05/19/2014   LIMA to LAD, SVG to diag, SVG to OM, SVG to PDA, EVH via right thigh and leg    Past Surgical History:  Procedure Laterality Date   CARDIAC CATHETERIZATION     COLONOSCOPY     CORONARY ARTERY BYPASS GRAFT N/A 05/19/2014   Procedure: CORONARY ARTERY BYPASS GRAFTING (CABG);  Surgeon: Rexene Alberts, MD;   Location: [facility];  Service: Open Heart Surgery;  Laterality: N/A;  Times 4 using left internal mammary artery and endoscopically harvested right saphenous vein   ESOPHAGOGASTRODUODENOSCOPY     FINGER SURGERY     cut off end of finger   FLEXIBLE SIGMOIDOSCOPY     HEMORRHOID BANDING     HERNIA REPAIR     INCISION AND DRAINAGE WOUND WITH FOREIGN BODY REMOVAL Left 12/20/2013   Procedure: INCISION AND DRAINAGE LEFT INDEX FINGER;  Surgeon: Tennis Must, MD;  Location: [facility];  Service: Orthopedics;  Laterality: Left;   INTRAOPERATIVE TRANSESOPHAGEAL ECHOCARDIOGRAM N/A 05/19/2014   Procedure: INTRAOPERATIVE TRANSESOPHAGEAL ECHOCARDIOGRAM;  Surgeon: Rexene Alberts, MD;  Location: [facility];  Service: Open Heart Surgery;  Laterality: N/A;   lamenectomy     LEFT HEART CATHETERIZATION WITH CORONARY ANGIOGRAM N/A 05/12/2014   Procedure: LEFT HEART CATHETERIZATION WITH CORONARY ANGIOGRAM;  Surgeon: Sinclair Grooms, MD;  Location: [facility];  Service: Cardiovascular;  Laterality: N/A;   LUMBAR FUSION     TONSILLECTOMY     VARICOSE VEIN SURGERY Left     Social History   Socioeconomic History   Marital status: Married    Spouse name: Not on file   Number of children: 4   Years of education: 11   Highest education level: Not on file  Occupational History   Occupation: retired    Fish farm manager: RETIRED    Comment: from  [employer]  Tobacco Use   Smoking status: Former    Packs/day: 0.50    Years: 42.00    Pack years: 21.00    Types: Cigarettes    Quit date: 11/17/1998    Years since quitting: 23.0   Smokeless tobacco: Never  Vaping Use   Vaping Use: Never used  Substance and Sexual Activity   Alcohol use: No   Drug use: No   Sexual activity: Yes  Other Topics Concern   Not on file  Social History Narrative   Married 35 years (2nd marriage). 2 kids from previous marriage (lost a 40rd child hit by car at age 43). 2 stepchildren. 6 grandkids. 2 greatgrandkids.       Retired from [employer]: walking/exercise, building in shop-furniture (cut finger off in February doing this)   6 grand children - Has bought them all a new car    Brother died of probably addiction   Sister also died; not sure of cause    He enjoys his life; Likes to play the keyboard; guitar    Had played in a gospel quartet    Enjoys working with wood    Right handed   Drinks caffeine   Social Determinants of Radio broadcast assistant Strain: Low Risk    Difficulty of Paying Living Expenses: Not hard at all  Food Insecurity: No Food Insecurity   Worried About Charity fundraiser in the Last Year: Never true   Arboriculturist in the Last Year: Never true  Transportation Needs: No Transportation Needs   Lack of Transportation (Medical): No   Lack of Transportation (Non-Medical): No  Physical Activity: Inactive   Days of Exercise per Week: 0 days   Minutes of Exercise per Session: 0 min  Stress: No Stress Concern Present   Feeling of Stress : Not at all  Social Connections: Moderately Isolated   Frequency of Communication with Friends and Family: More than three times a week   Frequency of Social Gatherings with Friends and Family: More than three times a week   Attends Religious Services: Never   Marine scientist or Organizations: No   Attends Music therapist: Never   Marital Status: Married  Human resources officer Violence: Not At Risk   Fear of Current or Ex-Partner: No   Emotionally Abused: No   Physically Abused: No   Sexually Abused: No    Current Outpatient Medications on File Prior to Visit  Medication Sig Dispense Refill   acetaminophen (TYLENOL) 500 MG tablet Take 500 mg by mouth daily as needed (for pain). Reported on 04/11/2016     aspirin 81 MG EC tablet Take 1 tablet (81 mg total) by mouth daily.     donepezil (ARICEPT) 10 MG tablet Take half tablet (5 mg) daily for 2 weeks, then increase to the full tablet at 10 mg daily 30 tablet 11    methimazole (TAPAZOLE) 5 MG tablet Take 1 tablet (5 mg total) by mouth daily. 90 tablet 3   Multiple Vitamins-Minerals (CENTRUM SILVER PO) Take 1 tablet by mouth daily.     rosuvastatin (CRESTOR) 20 MG tablet Take 1 tablet (20 mg total) by mouth daily. 90 tablet 3   tamsulosin (FLOMAX) 0.4 MG CAPS capsule Take 0.4 mg by mouth. Prescribed by urology     No current facility-administered medications on file prior to visit.    Allergies  Allergen Reactions  Lipitor [Atorvastatin] Other (See Comments)    REACTION: nausea and blurred vision   Trazodone And Nefazodone Other (See Comments)    dizzy   Ciprofloxacin Swelling   Mycophenolate Mofetil Other (See Comments)    REACTION: unspecified   Amoxicillin Rash    Has patient had a PCN reaction causing immediate rash, facial/tongue/throat swelling, SOB or lightheadedness with hypotension: Unknown Has patient had a PCN reaction causing severe rash involving mucus membranes or skin necrosis: Unknown Has patient had a PCN reaction that required hospitalization: Unknown Has patient had a PCN reaction occurring within the last 10 years: Unknown If all of the above answers are "NO", then may proceed with Cephalosporin use.    Penicillins Rash    Has patient had a PCN reaction causing immediate rash, facial/tongue/throat swelling, SOB or lightheadedness with hypotension: Unknown Has patient had a PCN reaction causing severe rash involving mucus membranes or skin necrosis: Unknown Has patient had a PCN reaction that required hospitalization: Unknown Has patient had a PCN reaction occurring within the last 10 years: Unknown If all of the above answers are "NO", then may proceed with Cephalosporin use.    Rosuvastatin Other (See Comments)    Unknown     Family History  Problem Relation Age of Onset   Hernia Mother    Heart disease Father        smoker   COPD Father    Heart attack Brother    Early death Daughter    Colon cancer Neg Hx      BP (!) 150/84    Pulse 81    Ht 6\' 2"  (1.88 m)    Wt 191 lb 3.2 oz (86.7 kg)    SpO2 98%    BMI 24.55 kg/m    Review of Systems Denies fever.      Objective:   Physical Exam VITAL SIGNS:  See vs page GENERAL: no distress NECK: There is no palpable thyroid enlargement.  No thyroid nodule is palpable.  No palpable lymphadenopathy at the anterior neck.     Lab Results  Component Value Date   TSH 0.24 (L) 11/27/2021      Assessment & Plan:  Hyperthyroidism: uncontrolled.     Patient Instructions  Please continue the same methimazole.  With more time, it might work even more.  If ever you have fever while taking methimazole, stop it and call us, even if the reason is obvious, because of the risk of a rare side-effect.  It is best to never miss the medication.  However, if you do miss it, next best is to double up the next time.    Please come back for a follow-up appointment in 2 months.

## 2021-12-10 NOTE — Patient Instructions (Addendum)
Please continue the same methimazole.  With more time, it might work even more.  If ever you have fever while taking methimazole, stop it and call us, even if the reason is obvious, because of the risk of a rare side-effect.  It is best to never miss the medication.  However, if you do miss it, next best is to double up the next time.    Please come back for a follow-up appointment in 2 months.

## 2021-12-19 ENCOUNTER — Ambulatory Visit
Admission: RE | Admit: 2021-12-19 | Discharge: 2021-12-19 | Disposition: A | Discharge status: Home / Self Care | Payer: Medicare Other | Source: Ambulatory Visit | Attending: Family Medicine | Admitting: Family Medicine

## 2021-12-19 ENCOUNTER — Other Ambulatory Visit: Payer: Self-pay

## 2021-12-19 DIAGNOSIS — I639 Cerebral infarction, unspecified: Secondary | ICD-10-CM | POA: Diagnosis not present

## 2021-12-19 DIAGNOSIS — R413 Other amnesia: Secondary | ICD-10-CM

## 2021-12-19 DIAGNOSIS — G319 Degenerative disease of nervous system, unspecified: Secondary | ICD-10-CM | POA: Diagnosis not present

## 2021-12-19 DIAGNOSIS — I6782 Cerebral ischemia: Secondary | ICD-10-CM | POA: Diagnosis not present

## 2021-12-20 ENCOUNTER — Telehealth: Payer: Self-pay | Admitting: Family Medicine

## 2021-12-20 NOTE — Telephone Encounter (Signed)
Pt's wife called in to discuss MRI results.

## 2021-12-23 NOTE — Telephone Encounter (Signed)
Called and reviewed results with pt wife.

## 2022-01-07 ENCOUNTER — Ambulatory Visit: Payer: Medicare Other

## 2022-01-13 ENCOUNTER — Other Ambulatory Visit: Payer: Self-pay

## 2022-01-13 ENCOUNTER — Encounter: Payer: Self-pay | Admitting: Family Medicine

## 2022-01-13 ENCOUNTER — Ambulatory Visit (INDEPENDENT_AMBULATORY_CARE_PROVIDER_SITE_OTHER): Payer: Medicare Other | Admitting: Family Medicine

## 2022-01-13 VITALS — BP 140/78 | HR 69 | Temp 98.3°F | Ht 74.0 in | Wt 193.0 lb

## 2022-01-13 DIAGNOSIS — R739 Hyperglycemia, unspecified: Secondary | ICD-10-CM | POA: Diagnosis not present

## 2022-01-13 DIAGNOSIS — E538 Deficiency of other specified B group vitamins: Secondary | ICD-10-CM | POA: Diagnosis not present

## 2022-01-13 DIAGNOSIS — I10 Essential (primary) hypertension: Secondary | ICD-10-CM | POA: Diagnosis not present

## 2022-01-13 DIAGNOSIS — E785 Hyperlipidemia, unspecified: Secondary | ICD-10-CM

## 2022-01-13 DIAGNOSIS — I951 Orthostatic hypotension: Secondary | ICD-10-CM | POA: Diagnosis not present

## 2022-01-13 MED ORDER — ROSUVASTATIN CALCIUM 20 MG PO TABS: 20.0000 mg | ORAL_TABLET | Freq: Every day | ORAL | 3 refills | Status: DC

## 2022-01-13 MED ORDER — ROSUVASTATIN CALCIUM 40 MG PO TABS: 40.0000 mg | ORAL_TABLET | Freq: Every day | ORAL | 3 refills | Status: DC

## 2022-01-13 MED ORDER — CYANOCOBALAMIN 1000 MCG/ML IJ SOLN
1000.0000 ug | Freq: Once | INTRAMUSCULAR | Status: AC
  Administered 2022-01-13: 1000 ug via INTRAMUSCULAR

## 2022-01-13 NOTE — Progress Notes (Addendum)
Phone [phone] In person visit   Subjective:   Timothy Gallagher is a 86 y.o. year old very pleasant male patient who presents for/with See problem oriented charting Chief Complaint  Patient presents with   Leg Pain    Pt c/o bilateral leg pain when he drives/rides that comes and goes.   This visit occurred during the SARS-CoV-2 public health emergency.  Safety protocols were in place, including screening questions prior to the visit, additional usage of staff PPE, and extensive cleaning of exam room while observing appropriate contact time as indicated for disinfecting solutions.   Past Medical History-  Patient Active Problem List   Diagnosis Date Noted   Hyperthyroidism 01/13/2018    Priority: High   Detached retina, left 06/30/2017    Priority: High   Infrarenal abdominal aortic aneurysm (AAA) without rupture 05/12/2017    Priority: High   CAD (coronary artery disease) of artery bypass graft 05/19/2014    Priority: High   Memory loss 11/01/2020    Priority: Medium    History of atrial fibrillation 08/26/2019    Priority: Medium    BPPV (benign paroxysmal positional vertigo) 09/01/2016    Priority: Medium    Insomnia 11/22/2014    Priority: Medium    Anemia 07/20/2014    Priority: Medium    BPH (benign prostatic hyperplasia) 12/05/2013    Priority: Medium    Hyperlipidemia 03/13/2008    Priority: Medium    B12 deficiency 06/07/2007    Priority: Medium    Essential hypertension 04/09/2007    Priority: Medium    Chronic obstructive pulmonary disease ([place]) 08/26/2019    Priority: Low   Left sided abdominal pain 04/15/2017    Priority: Low   Low back pain 02/06/2017    Priority: Low   Anal fissure 04/07/2016    Priority: Low   Internal and external bleeding hemorrhoids 01/22/2015    Priority: Low   Palpitations 02/11/2013    Priority: Low   OSTEOARTHRITIS, WRIST, RIGHT 08/05/2010    Priority: Low   ACNE ROSACEA 06/27/2009    Priority: Low   ESOPHAGEAL  STRICTURE 04/27/2009    Priority: Low   ACTINIC KERATOSIS, HEAD 04/18/2009    Priority: Low   NECK PAIN 09/14/2008    Priority: Low   NEUROPATHY, IDIOPATHIC PERIPHERAL NEC 08/11/2007    Priority: Low   Irritable bowel syndrome 08/11/2007    Priority: Low   ALLERGIC RHINITIS 04/09/2007    Priority: Low   Mixed vascular and neurodegenerative dementia without behavioral disturbance ([place]) 11/28/2021   Abdominal aortic aneurysm (AAA) without rupture, unspecified part 11/27/2021    Medications- reviewed and updated Current Outpatient Medications  Medication Sig Dispense Refill   acetaminophen (TYLENOL) 500 MG tablet Take 500 mg by mouth daily as needed (for pain). Reported on 04/11/2016     aspirin 81 MG EC tablet Take 1 tablet (81 mg total) by mouth daily.     donepezil (ARICEPT) 10 MG tablet Take half tablet (5 mg) daily for 2 weeks, then increase to the full tablet at 10 mg daily 30 tablet 11   methimazole (TAPAZOLE) 5 MG tablet Take 1 tablet (5 mg total) by mouth daily. 90 tablet 3   Multiple Vitamins-Minerals (CENTRUM SILVER PO) Take 1 tablet by mouth daily.     tamsulosin (FLOMAX) 0.4 MG CAPS capsule Take 0.4 mg by mouth. Prescribed by urology     rosuvastatin (CRESTOR) 40 MG tablet Take 1 tablet (40 mg total) by mouth daily. Dose  increase 01/13/22 due to heart disease and LDL goal under 70 90 tablet 3   No current facility-administered medications for this visit.     Objective:  BP 140/78    Pulse 69    Temp 98.3 F (36.8 C)    Ht 6\' 2"  (1.88 m)    Wt 193 lb (87.5 kg)    SpO2 96%    BMI 24.78 kg/m  Gen: NAD, resting comfortably CV: RRR no murmurs rubs or gallops Lungs: CTAB no crackles, wheeze, rhonchi Ext: no edema Skin: warm, dry Neuro: grossly normal, moves all extremities Msk: no leg weakness- good ROM of leg, no pain with palpation of hamstring or against resisted flexion    Assessment and Plan   # Primarily right hamstring pain S:Today, pt stated when driving he  experiences leg pain. Reports about a legs worth of issues. If gets up and moves it tends to get better. He states is not very frequent. Usually the right side and in his hamstring- if places his hand there it improves. Even if moves from seated position feels better.   Worse with damp weather. No issues with walking.  -gets some back pain with it- history of back surgery A/P: he states not significantly bothered by this in the end- did not want home exercises, PT, sports medicine, or further workup or meds- wants to monitor only.  -Doubt significant lumbar disc issues but could have mild intermittent nerve irritation  # B12 deficiency S: Current treatment/medication (oral vs. IM): 1000 mcg monthly  Lab Results  Component Value Date   VITAMINB12 >1550 (H) 05/16/2021  A/P: injection given today- has been well controlled- continue monthly injections   #hypertension/orthostatic hypotension S: medication:  none -orthostatic intolerance on ARB and beta blocker in past- and BP had trended down. Still with some orthostatic issues on tamsulosin BP Readings from Last 3 Encounters:  01/13/22 140/78  12/10/21 (!) 150/84  11/28/21 137/67  A/P: mildly elevated but with history of orthostatic issues worse on BP meds- continue to monitor without medicine- encouraged him to exercise regularly as well   #hyperlipidemia S: Medication:rosuvastatin 20 mg  Lab Results  Component Value Date   CHOL 139 05/16/2021   HDL 47.50 05/16/2021   LDLCALC 72 05/16/2021   LDLDIRECT 75.0 11/27/2021   TRIG 98.0 05/16/2021   CHOLHDL 3 05/16/2021   A/P: LDL just slightly above goal for CAD history- increase to rosuvastatin 40 mg- team was to update for patient after last labs but does not appear he was reached  Recommended follow up: Return for next already scheduled visit or sooner if needed- in may. Future Appointments  Date Time Provider [place]  02/11/2022  9:00 AM Renato Shin, MD [facility] None   03/05/2022  9:30 AM Rondel Jumbo, PA-C [facility] None  03/17/2022 10:40 AM [name], MD [facility]  10/16/2022  1:45 PM [facility] HEALTH COACH [facility]   Lab/Order associations:   ICD-10-CM   1. Essential hypertension  I10     2. B12 deficiency  E53.8 cyanocobalamin ((VITAMIN B-12)) injection 1,000 mcg    3. Orthostatic hypotension  I95.1     4. Hyperglycemia  R73.9     5. Hyperlipidemia, unspecified hyperlipidemia type  E78.5      Meds ordered this encounter  Medications   cyanocobalamin ((VITAMIN B-12)) injection 1,000 mcg   DISCONTD: rosuvastatin (CRESTOR) 20 MG tablet    Sig: Take 1 tablet (20 mg total) by mouth daily.  Dispense:  90 tablet    Refill:  3   rosuvastatin (CRESTOR) 40 MG tablet    Sig: Take 1 tablet (40 mg total) by mouth daily. Dose increase 01/13/22 due to heart disease and LDL goal under 70    Dispense:  90 tablet    Refill:  3   I,Jada Bradford,acting as a scribe for [name], MD.,have documented all relevant documentation on the behalf of [name], MD,as directed by  [name], MD while in the presence of [name], MD.  I, [name], MD, have reviewed all documentation for this visit. The documentation on 01/13/22 for the exam, diagnosis, procedures, and orders are all accurate and complete.  Return precautions advised.  [name], MD

## 2022-01-13 NOTE — Patient Instructions (Addendum)
Increase rosuvastatin to 40mg - we can recheck at next visit -let me know how you do with this   Blood pressure hair high today but you have not done well with blood pressure meds in past- continue to monitor unless worsens  Recommended follow up: Return for next already scheduled visit or sooner if needed- in may.

## 2022-01-27 NOTE — Progress Notes (Incomplete)
[name] [place] Phone [phone] In person visit   Subjective:   Timothy Gallagher is a 86 y.o. year old very pleasant male patient who presents for/with See problem oriented charting No chief complaint on file.   This visit occurred during the SARS-CoV-2 public health emergency.  Safety protocols were in place, including screening questions prior to the visit, additional usage of staff PPE, and extensive cleaning of exam room while observing appropriate contact time as indicated for disinfecting solutions.   Past Medical History-  Patient Active Problem List   Diagnosis Date Noted   Mixed vascular and neurodegenerative dementia without behavioral disturbance ([place]) 11/28/2021   Abdominal aortic aneurysm (AAA) without rupture, unspecified part 11/27/2021   Memory loss 11/01/2020   Chronic obstructive pulmonary disease ([place]) 08/26/2019   History of atrial fibrillation 08/26/2019   Hyperthyroidism 01/13/2018   Detached retina, left 06/30/2017   Infrarenal abdominal aortic aneurysm (AAA) without rupture 05/12/2017   Left sided abdominal pain 04/15/2017   Low back pain 02/06/2017   BPPV (benign paroxysmal positional vertigo) 09/01/2016   Anal fissure 04/07/2016   Internal and external bleeding hemorrhoids 01/22/2015   Insomnia 11/22/2014   Anemia 07/20/2014   CAD (coronary artery disease) of artery bypass graft 05/19/2014   BPH (benign prostatic hyperplasia) 12/05/2013   Palpitations 02/11/2013   OSTEOARTHRITIS, WRIST, RIGHT 08/05/2010   ACNE ROSACEA 06/27/2009   ESOPHAGEAL STRICTURE 04/27/2009   ACTINIC KERATOSIS, HEAD 04/18/2009   NECK PAIN 09/14/2008   Hyperlipidemia 03/13/2008   NEUROPATHY, IDIOPATHIC PERIPHERAL NEC 08/11/2007   Irritable bowel syndrome 08/11/2007   B12 deficiency 06/07/2007   Essential hypertension 04/09/2007   ALLERGIC RHINITIS 04/09/2007    Medications- reviewed and updated Current Outpatient Medications  Medication Sig Dispense Refill   acetaminophen  (TYLENOL) 500 MG tablet Take 500 mg by mouth daily as needed (for pain). Reported on 04/11/2016     aspirin 81 MG EC tablet Take 1 tablet (81 mg total) by mouth daily.     donepezil (ARICEPT) 10 MG tablet Take half tablet (5 mg) daily for 2 weeks, then increase to the full tablet at 10 mg daily 30 tablet 11   methimazole (TAPAZOLE) 5 MG tablet Take 1 tablet (5 mg total) by mouth daily. 90 tablet 3   Multiple Vitamins-Minerals (CENTRUM SILVER PO) Take 1 tablet by mouth daily.     rosuvastatin (CRESTOR) 40 MG tablet Take 1 tablet (40 mg total) by mouth daily. Dose increase 01/13/22 due to heart disease and LDL goal under 70 90 tablet 3   tamsulosin (FLOMAX) 0.4 MG CAPS capsule Take 0.4 mg by mouth. Prescribed by urology     No current facility-administered medications for this visit.     Objective:  There were no vitals taken for this visit. Gen: NAD, resting comfortably CV: RRR no murmurs rubs or gallops Lungs: CTAB no crackles, wheeze, rhonchi Abdomen: soft/nontender/nondistended/normal bowel sounds. No rebound or guarding.  Ext: no edema Skin: warm, dry Neuro: grossly normal, moves all extremities  ***    Assessment and Plan   *** son wants memory check *** ***Proteinuria. urine micro normal. microalb/cr ratio <30 on 02/25/20 ***severe UTI so remains on BPH meds despite orthostatic symptoms  #MMSE typically around 24 out of 30- declined neurology follow-up as of December 2021-can discuss again next visit*** repeat again january 2023 planned ***   #CAD- with history of CABG in 2015 - follows with Dr. Daneen Schick #hyperlipidemia - LDL goal under 70 S: Medication:rosuvastatin 40 mg and aspirin 81 mg -  atorvastatin 20 mg in the past Lab Results  Component Value Date   CHOL 139 05/16/2021   HDL 47.50 05/16/2021   LDLCALC 72 05/16/2021   LDLDIRECT 75.0 11/27/2021   TRIG 98.0 05/16/2021   CHOLHDL 3 05/16/2021   A/P: ***  #hyperthyroidism- follows with Dr. Loanne Drilling S: compliant On  thyroid medication- methimazole 5 mg  Lab Results Component Value Date TSH 4.62 (H) 10/01/2020 Lab Results  Component Value Date   TSH 0.24 (L) 11/27/2021    A/P:***   #hypertension/orthostatic hypotension S: medication:  none -orthostatic intolerance on ARB and beta blocker in past- and BP has trended down. Still with some orthostatic issues on tamsulosin Home readings #s: *** BP Readings from Last 3 Encounters:  01/13/22 140/78  12/10/21 (!) 150/84  11/28/21 137/67  A/P: ***  # AAA S:Stable 3.3 cm infrarenal AAA on 01/24/20- repeat 3 years planned  A/P: ***   # BPH S:history of severe UTI leading to hospitalization. Orthostatic issues but feel we have to leave him on flomax  A/P: ***   # B12 deficiency S: Current treatment/medication (oral vs. IM):  1019mg injections montly Lab Results  Component Value Date   VITAMINB12 >1550 (H) 05/16/2021   A/P: ***   #history of left retina detachment- permanent loss of vision ***   # Hyperglycemia/insulin resistance/prediabetes- peak a1c of 6.4 S: Medication: *** Exercise and diet- *** Lab Results  Component Value Date   HGBA1C 6.4 11/27/2021   HGBA1C 6.4 05/16/2021   HGBA1C 6.3 (H) 05/17/2014    A/P: ***  There are no preventive care reminders to display for this patient.  Recommended follow up: No follow-ups on file. Future Appointments  Date Time Provider [place] 02/11/2022  9:00 AM ERenato Shin MD [facility] None  03/05/2022  9:30 AM WRondel Jumbo PA-C [facility] None  03/17/2022 10:40 AM HMarin Olp MD [facility]  10/16/2022  1:45 PM [facility] HEALTH COACH [facility]    Lab/Order associations: No diagnosis found.  No orders of the defined types were placed in this encounter.   Return precautions advised.  JBurnett Corrente

## 2022-01-28 ENCOUNTER — Telehealth: Payer: Self-pay | Admitting: Physician Assistant

## 2022-01-28 NOTE — Telephone Encounter (Signed)
Patient called and said his donepezil is not getting filled because the dosage directions changed after two weeks to one a day from 1/2 a day. ? ?[facility] on [address] ? ?Patient is out of the medicine. ? ?90 days increment ?

## 2022-01-29 ENCOUNTER — Other Ambulatory Visit: Payer: Self-pay | Admitting: Physician Assistant

## 2022-01-29 MED ORDER — DONEPEZIL HCL 10 MG PO TABS: 10.0000 mg | ORAL_TABLET | Freq: Every day | ORAL | 3 refills | Status: DC

## 2022-02-11 ENCOUNTER — Ambulatory Visit: Payer: Medicare Other | Admitting: Endocrinology

## 2022-02-11 ENCOUNTER — Other Ambulatory Visit: Payer: Self-pay

## 2022-02-11 ENCOUNTER — Encounter: Payer: Self-pay | Admitting: Endocrinology

## 2022-02-11 VITALS — BP 148/90 | HR 70 | Ht 74.0 in | Wt 190.4 lb

## 2022-02-11 DIAGNOSIS — E0590 Thyrotoxicosis, unspecified without thyrotoxic crisis or storm: Secondary | ICD-10-CM

## 2022-02-11 LAB — TSH: TSH: 3.86 u[IU]/mL (ref 0.35–5.50)

## 2022-02-11 LAB — T4, FREE: Free T4: 0.72 ng/dL (ref 0.60–1.60)

## 2022-02-11 NOTE — Patient Instructions (Addendum)
Please continue the same methimazole.  With more time, it might work even more.   ?If ever you have fever while taking methimazole, stop it and call us, even if the reason is obvious, because of the risk of a rare side-effect.  ?It is best to never miss the medication.  However, if you do miss it, next best is to double up the next time.   ?Please come back for a follow-up appointment in 4 months.   ?

## 2022-02-11 NOTE — Progress Notes (Signed)
? ?Subjective:  ? ? Patient ID: Timothy Gallagher, male    DOB: Oct 23, 1936, 86 y.o.   MRN: [id number] ? ?HPI ?Pt returns for f/u of hyperthyroidism (dx'ed early 2019; TFT improved initially, but recurred in July, 2019, and was started on tapazole; he has never had thyroid imaging; he takes toprol for CAD, not thyroid; he declines RAI).  pt states he feels no different, and well in general.  Specifically, he denies palpitations and tremor. Pt again says BP is normal at home.    ?Past Medical History:  ?Diagnosis Date  ? ACNE ROSACEA 06/27/2009  ? ACTINIC KERATOSIS, HEAD 04/18/2009  ? Acute maxillary sinusitis 05/14/2010  ? ALLERGIC RHINITIS 04/09/2007  ? Anal fissure 04/07/2016  ? B12 DEFICIENCY 06/07/2007  ? BACK PAIN WITH RADICULOPATHY 04/24/2008  ? Cancer [facility])   ? skin  ? CHEST WALL PAIN, ACUTE 06/15/2009  ? Chronic maxillary sinusitis 05/29/2008  ? COLITIS 04/27/2009  ? COLONIC POLYPS, HX OF 04/27/2009  ? tubular adenomas  ? Coronary artery disease 05/12/2014  ? Cath 05/12/2014 w/ severe 3-vessel CAD and preserved LV function, EF 55%  ? DERMATITIS, ATOPIC 10/12/2007  ? DIVERTICULOSIS, COLON 04/27/2009  ? ECCHYMOSES, SPONTANEOUS 06/27/2008  ? Elevated sedimentation rate 05/02/2009  ? ESOPHAGEAL STRICTURE 04/27/2009  ? GASTRITIS, CHRONIC 04/27/2009  ? HYPERLIPIDEMIA 03/13/2008  ? HYPERTENSION 04/09/2007  ? Internal bleeding hemorrhoids 01/22/2015  ? 01/22/2015 Seen at anoscopy, grade 1 all 3 positions   ? Irritable bowel syndrome 08/11/2007  ? KIDNEY DISEASE 04/09/2007  ? NECK PAIN 09/14/2008  ? NEUROPATHY, IDIOPATHIC PERIPHERAL NEC 08/11/2007  ? OSTEOARTHRITIS, WRIST, RIGHT 08/05/2010  ? Postoperative delirium 05/20/2014  ? S/P CABG x 4 05/19/2014  ? LIMA to LAD, SVG to diag, SVG to OM, SVG to PDA, EVH via right thigh and leg  ? ? ?Past Surgical History:  ?Procedure Laterality Date  ? CARDIAC CATHETERIZATION    ? COLONOSCOPY    ? CORONARY ARTERY BYPASS GRAFT N/A 05/19/2014  ? Procedure: CORONARY ARTERY BYPASS GRAFTING (CABG);  Surgeon: Rexene Alberts, MD;  Location: [place];  Service: Open Heart Surgery;  Laterality: N/A;  Times 4 using left internal mammary artery and endoscopically harvested right saphenous vein  ? ESOPHAGOGASTRODUODENOSCOPY    ? FINGER SURGERY    ? cut off end of finger  ? FLEXIBLE SIGMOIDOSCOPY    ? HEMORRHOID BANDING    ? HERNIA REPAIR    ? INCISION AND DRAINAGE WOUND WITH FOREIGN BODY REMOVAL Left 12/20/2013  ? Procedure: INCISION AND DRAINAGE LEFT INDEX FINGER;  Surgeon: Tennis Must, MD;  Location: [facility];  Service: Orthopedics;  Laterality: Left;  ? INTRAOPERATIVE TRANSESOPHAGEAL ECHOCARDIOGRAM N/A 05/19/2014  ? Procedure: INTRAOPERATIVE TRANSESOPHAGEAL ECHOCARDIOGRAM;  Surgeon: Rexene Alberts, MD;  Location: [place];  Service: Open Heart Surgery;  Laterality: N/A;  ? lamenectomy    ? LEFT HEART CATHETERIZATION WITH CORONARY ANGIOGRAM N/A 05/12/2014  ? Procedure: LEFT HEART CATHETERIZATION WITH CORONARY ANGIOGRAM;  Surgeon: Sinclair Grooms, MD;  Location: [facility] CATH LAB;  Service: Cardiovascular;  Laterality: N/A;  ? LUMBAR FUSION    ? TONSILLECTOMY    ? VARICOSE VEIN SURGERY Left   ? ? ?Social History  ? ?Socioeconomic History  ? Marital status: Married  ?  Spouse name: Not on file  ? Number of children: 4  ? Years of education: 9  ? Highest education level: Not on file  ?Occupational History  ? Occupation: retired  ?  Employer: RETIRED  ?  Comment: from [employer]  ?Tobacco Use  ? Smoking status: Former  ?  Packs/day: 0.50  ?  Years: 42.00  ?  Pack years: 21.00  ?  Types: Cigarettes  ?  Quit date: 11/17/1998  ?  Years since quitting: 23.2  ? Smokeless tobacco: Never  ?Vaping Use  ? Vaping Use: Never used  ?Substance and Sexual Activity  ? Alcohol use: No  ? Drug use: No  ? Sexual activity: Yes  ?Other Topics Concern  ? Not on file  ?Social History Narrative  ? Married 35 years (2nd marriage). 2 kids from previous marriage (lost a 60rd child hit by car at age 64). 2 stepchildren. 6 grandkids. 2 greatgrandkids.   ?   ? Retired from [employer]  ?   ? Hobbies: walking/exercise, building in shop-furniture (cut finger off in February doing this)  ? 6 grand children - Has bought them all a new car   ? Brother died of probably addiction  ? Sister also died; not sure of cause   ? He enjoys his life; Likes to play the keyboard; guitar   ? Had played in a gospel quartet   ? Enjoys working with wood   ? Right handed  ? Drinks caffeine  ? ?Social Determinants of Health  ? ?Financial Resource Strain: Low Risk   ? Difficulty of Paying Living Expenses: Not hard at all  ?Food Insecurity: No Food Insecurity  ? Worried About Charity fundraiser in the Last Year: Never true  ? Ran Out of Food in the Last Year: Never true  ?Transportation Needs: No Transportation Needs  ? Lack of Transportation (Medical): No  ? Lack of Transportation (Non-Medical): No  ?Physical Activity: Inactive  ? Days of Exercise per Week: 0 days  ? Minutes of Exercise per Session: 0 min  ?Stress: No Stress Concern Present  ? Feeling of Stress : Not at all  ?Social Connections: Moderately Isolated  ? Frequency of Communication with Friends and Family: More than three times a week  ? Frequency of Social Gatherings with Friends and Family: More than three times a week  ? Attends Religious Services: Never  ? Active Member of Clubs or Organizations: No  ? Attends Archivist Meetings: Never  ? Marital Status: Married  ?Intimate Partner Violence: Not At Risk  ? Fear of Current or Ex-Partner: No  ? Emotionally Abused: No  ? Physically Abused: No  ? Sexually Abused: No  ? ? ?Current Outpatient Medications on File Prior to Visit  ?Medication Sig Dispense Refill  ? acetaminophen (TYLENOL) 500 MG tablet Take 500 mg by mouth daily as needed (for pain). Reported on 04/11/2016    ? aspirin 81 MG EC tablet Take 1 tablet (81 mg total) by mouth daily.    ? donepezil (ARICEPT) 10 MG tablet Take 1 tablet (10 mg total) by mouth daily. 90 tablet 3  ? methimazole (TAPAZOLE) 5 MG tablet Take 1  tablet (5 mg total) by mouth daily. 90 tablet 3  ? Multiple Vitamins-Minerals (CENTRUM SILVER PO) Take 1 tablet by mouth daily.    ? rosuvastatin (CRESTOR) 40 MG tablet Take 1 tablet (40 mg total) by mouth daily. Dose increase 01/13/22 due to heart disease and LDL goal under 70 90 tablet 3  ? tamsulosin (FLOMAX) 0.4 MG CAPS capsule Take 0.4 mg by mouth. Prescribed by urology    ? ?No current facility-administered medications on file prior to visit.  ? ? ?Allergies  ?  Allergen Reactions  ? Lipitor [Atorvastatin] Other (See Comments)  ?  REACTION: nausea and blurred vision  ? Trazodone And Nefazodone Other (See Comments)  ?  dizzy  ? Ciprofloxacin Swelling  ? Mycophenolate Mofetil Other (See Comments)  ?  REACTION: unspecified  ? Amoxicillin Rash  ?  Has patient had a PCN reaction causing immediate rash, facial/tongue/throat swelling, SOB or lightheadedness with hypotension: Unknown ?Has patient had a PCN reaction causing severe rash involving mucus membranes or skin necrosis: Unknown ?Has patient had a PCN reaction that required hospitalization: Unknown ?Has patient had a PCN reaction occurring within the last 10 years: Unknown ?If all of the above answers are "NO", then may proceed with Cephalosporin use. ?  ? Penicillins Rash  ?  Has patient had a PCN reaction causing immediate rash, facial/tongue/throat swelling, SOB or lightheadedness with hypotension: Unknown ?Has patient had a PCN reaction causing severe rash involving mucus membranes or skin necrosis: Unknown ?Has patient had a PCN reaction that required hospitalization: Unknown ?Has patient had a PCN reaction occurring within the last 10 years: Unknown ?If all of the above answers are "NO", then may proceed with Cephalosporin use. ?  ? Rosuvastatin Other (See Comments)  ?  Unknown   ? ? ?Family History  ?Problem Relation Age of Onset  ? Hernia Mother   ? Heart disease Father   ?     smoker  ? COPD Father   ? Heart attack Brother   ? Early death Daughter   ?  Colon cancer Neg Hx   ? ? ?BP (!) 148/90 (BP Location: Left Arm, Patient Position: Sitting, Cuff Size: Normal)   Pulse 70   Ht '6\' 2"'$  (1.88 m)   Wt 190 lb 6.4 oz (86.4 kg)   SpO2 99%   BMI 24.45 kg/m?  ? ? ?Review

## 2022-02-18 ENCOUNTER — Ambulatory Visit: Payer: Medicare Other

## 2022-02-25 ENCOUNTER — Ambulatory Visit (INDEPENDENT_AMBULATORY_CARE_PROVIDER_SITE_OTHER): Payer: Medicare Other

## 2022-02-25 DIAGNOSIS — E538 Deficiency of other specified B group vitamins: Secondary | ICD-10-CM | POA: Diagnosis not present

## 2022-02-25 MED ORDER — CYANOCOBALAMIN 1000 MCG/ML IJ SOLN
1000.0000 ug | Freq: Once | INTRAMUSCULAR | Status: AC
  Administered 2022-02-25: 1000 ug via INTRAMUSCULAR

## 2022-02-25 NOTE — Progress Notes (Signed)
Pt here for B12 injection for Dr. Hunter. Injection given in left deltoid. Pt tolerated well.   

## 2022-03-05 ENCOUNTER — Ambulatory Visit: Payer: Medicare Other | Admitting: Physician Assistant

## 2022-03-05 ENCOUNTER — Encounter: Payer: Self-pay | Admitting: Physician Assistant

## 2022-03-05 VITALS — BP 171/81 | HR 71 | Ht 74.0 in | Wt 189.0 lb

## 2022-03-05 DIAGNOSIS — F0150 Vascular dementia without behavioral disturbance: Secondary | ICD-10-CM | POA: Diagnosis not present

## 2022-03-05 NOTE — Patient Instructions (Addendum)
It was a pleasure to see you today at our office.  ? ?Recommendations: ? ?Follow up in 6 months ?Continue donepezil 10 mg daily.   ?Make sure you visit the thyroid doctor for adjustment of the medicines  ?Monitor driving  ? ? ?RECOMMENDATIONS FOR ALL PATIENTS WITH MEMORY PROBLEMS: ?1. Continue to exercise (Recommend 30 minutes of walking everyday, or 3 hours every week) ?2. Increase social interactions - continue going to [place] and enjoy social gatherings with friends and family ?3. Eat healthy, avoid fried foods and eat more fruits and vegetables ?4. Maintain adequate blood pressure, blood sugar, and blood cholesterol level. Reducing the risk of stroke and cardiovascular disease also helps promoting better memory. ?5. Avoid stressful situations. Live a simple life and avoid aggravations. Organize your time and prepare for the next day in anticipation. ?6. Sleep well, avoid any interruptions of sleep and avoid any distractions in the bedroom that may interfere with adequate sleep quality ?7. Avoid sugar, avoid sweets as there is a strong link between excessive sugar intake, diabetes, and cognitive impairment ?We discussed the Mediterranean diet, which has been shown to help patients reduce the risk of progressive memory disorders and reduces cardiovascular risk. This includes eating fish, eat fruits and green leafy vegetables, nuts like almonds and hazelnuts, walnuts, and also use olive oil. Avoid fast foods and fried foods as much as possible. Avoid sweets and sugar as sugar use has been linked to worsening of memory function. ? ?There is always a concern of gradual progression of memory problems. If this is the case, then we may need to adjust level of care according to patient needs. Support, both to the patient and caregiver, should then be put into place.  ? ? ?FALL PRECAUTIONS: Be cautious when walking. Scan the area for obstacles that may increase the risk of trips and falls. When getting up in the mornings,  sit up at the edge of the bed for a few minutes before getting out of bed. Consider elevating the bed at the head end to avoid drop of blood pressure when getting up. Walk always in a well-lit room (use night lights in the walls). Avoid area rugs or power cords from appliances in the middle of the walkways. Use a walker or a cane if necessary and consider physical therapy for balance exercise. Get your eyesight checked regularly. ? ?FINANCIAL OVERSIGHT: Supervision, especially oversight when making financial decisions or transactions is also recommended. ? ?HOME SAFETY: Consider the safety of the kitchen when operating appliances like stoves, microwave oven, and blender. Consider having supervision and share cooking responsibilities until no longer able to participate in those. Accidents with firearms and other hazards in the house should be identified and addressed as well. ? ? ?ABILITY TO BE LEFT ALONE: If patient is unable to contact 911 operator, consider using LifeLine, or when the need is there, arrange for someone to stay with patients. Smoking is a fire hazard, consider supervision or cessation. Risk of wandering should be assessed by caregiver and if detected at any point, supervision and safe proof recommendations should be instituted. ? ?MEDICATION SUPERVISION: Inability to self-administer medication needs to be constantly addressed. Implement a mechanism to ensure safe administration of the medications. ? ? ?DRIVING: Regarding driving, in patients with progressive memory problems, driving will be impaired. We advise to have someone else do the driving if trouble finding directions or if minor accidents are reported. Independent driving assessment is available to determine safety of driving. ? ? ?If  you are interested in the driving assessment, you can contact the following: ? ?[facility] [employer] in [place] ? ?[place] [phone] ? ?[facility]  [phone] ? ?[facility] [phone] or [phone] ?  ?

## 2022-03-05 NOTE — Progress Notes (Signed)
? ?Assessment/Plan:  ? ?Dementia likely due to mixed vascular and Alzheimer's disease ? ?MRI brain without evidence of acute findings, but with mild-to-moderate chronic small-vessel ischemic changes within the cerebral white matter, mild to moderate generalized cerebral atrophy and mild cerebellar atrophy, and sub centimeter chronic infarct within the superior right cerebellar hemisphere. Patient on donepezil 10 mg daily, tolerating well. Denies any recent cognitive changes.  ? ? Recommendations:  ? ?Discussed safety both in and out of the home.  ?Discussed the importance of regular daily schedule with inclusion of crossword puzzles to maintain brain function.  ?Continue to monitor mood by PCP ?Stay active at least 30 minutes at least 3 times a week.  ?Naps should be scheduled and should be no longer than 60 minutes and should not occur after 2 PM.  ?Mediterranean diet is recommended  ?Control cardiovascular risk factors  ?Continue Donepezil  10 mg  Side effects were discussed ?Monitor driving  ?Follow up in  6  months. ? ? ?Case discussed with Dr. Delice Lesch who agrees with the plan ? ?  ? ?Subjective:  ? ? ?Timothy Gallagher is a very pleasant 86 y.o. RH male  seen today in follow up for memory loss. This patient is accompanied in the office by his wife who supplements the history.  Previous records as well as any outside records available were reviewed prior to todays visit.  Patient was last seen at our office on 11/27/21   at which time his MMSE was 20 /30. Patient is currently on donepezil 10 mg daily, tolerating well. MRI brain with mild-to-moderate chronic small-vessel ischemic changes within the cerebral white matter, mild to moderate generalized cerebral atrophy and mild cerebellar atrophy, and sub centimeter chronic infarct within the superior right cerebellar hemisphere. ?Since last visit, patient reports that memory is about the same. He continues to repeat himself. Sometimes he does not remember where he  has left objects.  He continues to drive only with his wife by his side, she denies that the patient is disoriented but she reports that he only drives short distances.  He denies having any irritability or mood changes, but his wife rolls her eyes after he states that he is "happy-go-lucky person".  Denies any depression.  He admits to not sleeping well because he has to get up around 3 times at night to urinate.  He denies any sleepwalking, REM behavior, hallucinations or paranoia.  He is independent of bathing and dressing, and his wife denies any hygiene concerns.  His medications are in a pillbox, but he forgets to take several doses.  His wife is in charge of the finances for the last 5 years.  His appetite is "too good "and denies trouble swallowing.  He does not cook.  He ambulates without difficulty, he has some chronic right leg pain due to arthritis.  He denies any falls or head injuries.  He does not use a cane to ambulate.  He denies a history of seizure, headaches, double vision, focal numbness or tingling, unilateral weakness, tremors or anosmia.  He has been evaluated for dizziness in view of orthostatic hypotension. He denies any constipation or diarrhea.  He denies a history of sleep apnea, alcohol or tobacco.  . ?   ? Initial Visit 11/27/21 The patient is seen in neurologic consultation at the request of [name], [name]  The patient is accompanied by his wife who supplements the history. ?This is a 86 y.o. year  old RH  male who has had memory issues for about 2 years, although he denies any symptoms.  Apparently, he had been referred by PCP for evaluation in September 2021, but he never told his wife about it, and in today's visit, he adamantly refuses that he has any memory problems.  His wife reports that he is repeating the same stories and asking the same questions and this has been worse over the last 6 months.  Sometimes he does not remember where he has  left objects.  He continues to drive only with his wife by his side, she denies that the patient is disoriented but she reports that he only drives short distances.  He denies having any irritability or mood changes, but his wife rolls her eyes after he states that he is "happy-go-lucky person".  Denies any depression.  He admits to not sleeping well because he has to get up around 3 times at night to urinate.  He denies any sleepwalking, REM behavior, hallucinations or paranoia.  He is independent of bathing and dressing, and his wife denies any hygiene concerns.  His medications are in a pillbox, but he forgets to take several doses.  His wife is in charge of the finances for the last 5 years.  His appetite is "too good "and denies trouble swallowing.  He does not cook.  He ambulates without difficulty, he has some chronic right leg pain due to arthritis, and is going to be seen by orthopedics soon.  He denies any falls or head injuries.  He does not use a cane to ambulate.  He denies a history of seizure, headaches, double vision, focal numbness or tingling, unilateral weakness, tremors or anosmia.  He has been evaluated for dizziness in view of orthostatic hypotension. ?He denies any constipation or diarrhea.  He denies a history of sleep apnea, alcohol or tobacco.  Family history negative for Alzheimer's disease.  He has high School education.  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  Last CT of the head on 12/31/2019 showed chronic small vessel ischemic changes. ?  ?Labs  A1c 6.4 TSH 0.24 with free T4 0.84, LDL normal at 75, chemistries normal, B12 normal. ?  ?MRI of the brain 12/19/21 No evidence of acute intracranial abnormality.3. Mild-to-moderate chronic small-vessel ischemic changes within the cerebral white matter.4. Subcentimeter chronic infarct within the superior right cerebellar hemisphere.5. Mild-to-moderate  generalized cerebral atrophy.6. Comparatively mild cerebellar atrophy. ? ?PREVIOUS MEDICATIONS:  ? ?CURRENT MEDICATIONS:  ?Outpatient Encounter Medications as of 03/05/2022  ?Medication Sig  ? acetaminophen (TYLENOL) 500 MG tablet Take 500 mg by mouth daily as needed (for pain). Reported on 04/11/2016  ? aspirin 81 MG EC tablet Take 1 tablet (81 mg total) by mouth daily.  ? donepezil (ARICEPT) 10 MG tablet Take 1 tablet (10 mg total) by mouth daily.  ? methimazole (TAPAZOLE) 5 MG tablet Take 1 tablet (5 mg total) by mouth daily.  ? Multiple Vitamins-Minerals (CENTRUM SILVER PO) Take 1 tablet by mouth daily.  ? rosuvastatin (CRESTOR) 40 MG tablet Take 1 tablet (40 mg total) by mouth daily. Dose increase 01/13/22 due to heart disease and LDL goal under 70  ? tamsulosin (FLOMAX) 0.4 MG CAPS capsule Take 0.4 mg by mouth. Prescribed by urology  ? ?No facility-administered encounter medications on file as of 03/05/2022.  ? ? ? ?Objective:  ?  ? ?PHYSICAL EXAMINATION:   ? ?  VITALS:   ?Vitals:  ? 03/05/22 0915  ?BP: (!) 171/81  ?Pulse: 71  ?SpO2: 96%  ?Weight: 189 lb (85.7 kg)  ?Height: '6\' 2"'$  (1.88 m)  ? ? ?GEN:  The patient appears stated age and is in NAD. ?HEENT:  Normocephalic, atraumatic.  ? ?Neurological examination: ? ?General: NAD, well-groomed, appears stated age. ?Orientation: The patient is alert. Oriented to person, place and date ?Cranial nerves: There is good facial symmetry.The speech is fluent and clear. No aphasia or dysarthria. Fund of knowledge is appropriate. Recent and remote memory are impaired. Attention and concentration are reduced.  Able to name objects and repeat phrases.  Hearing is intact to conversational tone.    ?Sensation: Sensation is intact to light touch throughout ?Motor: Strength is at least antigravity x4. ?Tremors: none  ?DTR's 2/4 in UE/LE  ? ?   ? View : No data to display.  ?  ?  ?  ?  ? ?  11/27/2021  ?  9:24 AM 11/01/2020  ?  8:08 PM 11/01/2020  ?  4:42 PM  ?MMSE - Mini Mental State  Exam  ?Orientation to time '2 3 3  '$ ?Orientation to Place '4 4 4  '$ ?Registration '3 3 3  '$ ?Attention/ Calculation '3 5 5  '$ ?Recall 0 0 0  ?Language- name 2 objects '2 2 2  '$ ?Language- repeat '1 1 1  '$ ?Language- follow 3

## 2022-03-11 ENCOUNTER — Telehealth: Payer: Self-pay | Admitting: Physician Assistant

## 2022-03-11 NOTE — Telephone Encounter (Signed)
Patients wife called about some code on husbands visit. She has a few questions. ?

## 2022-03-17 ENCOUNTER — Ambulatory Visit (INDEPENDENT_AMBULATORY_CARE_PROVIDER_SITE_OTHER): Payer: Medicare Other | Admitting: Family Medicine

## 2022-03-17 ENCOUNTER — Encounter: Payer: Self-pay | Admitting: Family Medicine

## 2022-03-17 VITALS — BP 140/70 | HR 51 | Temp 98.5°F | Ht 74.0 in | Wt 191.0 lb

## 2022-03-17 DIAGNOSIS — R739 Hyperglycemia, unspecified: Secondary | ICD-10-CM

## 2022-03-17 DIAGNOSIS — E0590 Thyrotoxicosis, unspecified without thyrotoxic crisis or storm: Secondary | ICD-10-CM

## 2022-03-17 DIAGNOSIS — I7143 Infrarenal abdominal aortic aneurysm, without rupture: Secondary | ICD-10-CM

## 2022-03-17 DIAGNOSIS — N401 Enlarged prostate with lower urinary tract symptoms: Secondary | ICD-10-CM

## 2022-03-17 DIAGNOSIS — R351 Nocturia: Secondary | ICD-10-CM

## 2022-03-17 DIAGNOSIS — I951 Orthostatic hypotension: Secondary | ICD-10-CM

## 2022-03-17 DIAGNOSIS — E538 Deficiency of other specified B group vitamins: Secondary | ICD-10-CM | POA: Diagnosis not present

## 2022-03-17 DIAGNOSIS — I10 Essential (primary) hypertension: Secondary | ICD-10-CM | POA: Diagnosis not present

## 2022-03-17 DIAGNOSIS — I25810 Atherosclerosis of coronary artery bypass graft(s) without angina pectoris: Secondary | ICD-10-CM | POA: Diagnosis not present

## 2022-03-17 MED ORDER — SERTRALINE HCL 25 MG PO TABS: 25.0000 mg | ORAL_TABLET | Freq: Every day | ORAL | 3 refills | Status: DC

## 2022-03-17 MED ORDER — CYANOCOBALAMIN 1000 MCG/ML IJ SOLN
1000.0000 ug | Freq: Once | INTRAMUSCULAR | Status: AC
  Administered 2022-03-17: 1000 ug via INTRAMUSCULAR

## 2022-03-17 NOTE — Addendum Note (Signed)
Addended by: Clyde Lundborg A on: 03/17/2022 11:29 AM ? ? Modules accepted: Orders ? ?

## 2022-03-17 NOTE — Patient Instructions (Addendum)
Team b12 shot today then have him schedule follow up in 1 month ? ?dementia is stable. Patient admits sometimes he gets irritable at home- he agrees to take sertraline 25 mg (very low dose compared to max '200mg'$ ) to see if we can help take the edge off of anxiety/irritability. He wil take once a day in the morning and encouraged follow up in 2 months. He can get a b12 shot in 1 month (nurse visit) and then see me in 2 months (doctor visit) ?- any thoughts of self harm or of harming others call us or 911 immediately (family can also call on your behalf) ? ?Recommended follow up: Return in about 2 months (around 05/17/2022) for followup or sooner if needed.Schedule b4 you leave. ?

## 2022-04-01 ENCOUNTER — Ambulatory Visit: Payer: Medicare Other | Admitting: Endocrinology

## 2022-04-17 ENCOUNTER — Ambulatory Visit: Payer: Medicare Other

## 2022-04-21 ENCOUNTER — Encounter: Payer: Self-pay | Admitting: Family Medicine

## 2022-04-21 ENCOUNTER — Ambulatory Visit (INDEPENDENT_AMBULATORY_CARE_PROVIDER_SITE_OTHER): Payer: Medicare Other | Admitting: Family Medicine

## 2022-04-21 VITALS — BP 118/60 | HR 71 | Temp 97.2°F | Ht 74.0 in | Wt 184.6 lb

## 2022-04-21 DIAGNOSIS — E538 Deficiency of other specified B group vitamins: Secondary | ICD-10-CM

## 2022-04-21 DIAGNOSIS — Z8673 Personal history of transient ischemic attack (TIA), and cerebral infarction without residual deficits: Secondary | ICD-10-CM

## 2022-04-21 DIAGNOSIS — I25810 Atherosclerosis of coronary artery bypass graft(s) without angina pectoris: Secondary | ICD-10-CM

## 2022-04-21 DIAGNOSIS — I10 Essential (primary) hypertension: Secondary | ICD-10-CM | POA: Diagnosis not present

## 2022-04-21 DIAGNOSIS — E785 Hyperlipidemia, unspecified: Secondary | ICD-10-CM

## 2022-04-21 MED ORDER — CYANOCOBALAMIN 1000 MCG/ML IJ SOLN
1000.0000 ug | Freq: Once | INTRAMUSCULAR | Status: AC
  Administered 2022-04-21: 1000 ug via INTRAMUSCULAR

## 2022-04-21 NOTE — Patient Instructions (Addendum)
We will call you within two weeks about your referral to look for plaque on carotid arteries (with history of stroke in balance center that likely causes your dizziness). If you do not hear within 2 weeks, give [place] a call.    B12 shot today  Recommended follow up: Return in about 3 months (around 07/22/2022) for followup or sooner if needed.Schedule b4 you leave. You can cancel your visit on July 6th- and instead just come in for b12 shot around then and then 1 month later, in 3 months we can see each other and give you shot at that time

## 2022-04-21 NOTE — Progress Notes (Signed)
Phone [phone] In person visit   Subjective:   Timothy Gallagher is a 86 y.o. year old very pleasant male patient who presents for/with See problem oriented charting Chief Complaint  Patient presents with   b12 deficiency   Hypertension   dizzy spells    Pt c/o dizzy spells that have been going on for ages.   Past Medical History-  Patient Active Problem List   Diagnosis Date Noted   Hyperthyroidism 01/13/2018    Priority: High   Detached retina, left 06/30/2017    Priority: High   Infrarenal abdominal aortic aneurysm (AAA) without rupture ([place]) 05/12/2017    Priority: High   CAD (coronary artery disease) of artery bypass graft 05/19/2014    Priority: High   Memory loss 11/01/2020    Priority: Medium    History of atrial fibrillation 08/26/2019    Priority: Medium    BPPV (benign paroxysmal positional vertigo) 09/01/2016    Priority: Medium    Insomnia 11/22/2014    Priority: Medium    Anemia 07/20/2014    Priority: Medium    BPH (benign prostatic hyperplasia) 12/05/2013    Priority: Medium    Hyperlipidemia 03/13/2008    Priority: Medium    B12 deficiency 06/07/2007    Priority: Medium    Essential hypertension 04/09/2007    Priority: Medium    Chronic obstructive pulmonary disease ([place]) 08/26/2019    Priority: Low   Left sided abdominal pain 04/15/2017    Priority: Low   Low back pain 02/06/2017    Priority: Low   Anal fissure 04/07/2016    Priority: Low   Internal and external bleeding hemorrhoids 01/22/2015    Priority: Low   Palpitations 02/11/2013    Priority: Low   OSTEOARTHRITIS, WRIST, RIGHT 08/05/2010    Priority: Low   ACNE ROSACEA 06/27/2009    Priority: Low   ESOPHAGEAL STRICTURE 04/27/2009    Priority: Low   ACTINIC KERATOSIS, HEAD 04/18/2009    Priority: Low   NECK PAIN 09/14/2008    Priority: Low   NEUROPATHY, IDIOPATHIC PERIPHERAL NEC 08/11/2007    Priority: Low   Irritable bowel syndrome 08/11/2007    Priority: Low   ALLERGIC  RHINITIS 04/09/2007    Priority: Low   Mixed vascular and neurodegenerative dementia without behavioral disturbance ([place]) 11/28/2021   Abdominal aortic aneurysm (AAA) without rupture, unspecified part ([place]) 11/27/2021    Medications- reviewed and updated Current Outpatient Medications  Medication Sig Dispense Refill   aspirin 81 MG EC tablet Take 1 tablet (81 mg total) by mouth daily.     donepezil (ARICEPT) 10 MG tablet Take 1 tablet (10 mg total) by mouth daily. 90 tablet 3   methimazole (TAPAZOLE) 5 MG tablet Take 1 tablet (5 mg total) by mouth daily. 90 tablet 3   Multiple Vitamins-Minerals (CENTRUM SILVER PO) Take 1 tablet by mouth daily.     rosuvastatin (CRESTOR) 40 MG tablet Take 1 tablet (40 mg total) by mouth daily. Dose increase 01/13/22 due to heart disease and LDL goal under 70 90 tablet 3   sertraline (ZOLOFT) 25 MG tablet Take 1 tablet (25 mg total) by mouth daily. 30 tablet 3   tamsulosin (FLOMAX) 0.4 MG CAPS capsule Take 0.4 mg by mouth. Prescribed by urology     No current facility-administered medications for this visit.     Objective:  BP 118/60   Pulse 71   Temp (!) 97.2 F (36.2 C)   Ht '6\' 2"'$  (1.88 m)  Wt 184 lb 9.6 oz (83.7 kg)   SpO2 95%   BMI 23.70 kg/m  Gen: NAD, resting comfortably CV: RRR no murmurs rubs or gallops Lungs: CTAB no crackles, wheeze, rhonchi Abdomen: soft/nontender/nondistended/normal bowel sounds.  Ext: no edema Skin: warm, dry Neuro: some repetition     Assessment and Plan    #Dementia likely due to mixed vascular and Alzheimer's dementia-follows with Dr. Toy Baker, PA #irritability #Dizziness-chronic issue with prior cerebellar stroke noted on MRI 12/19/2021 S: Medication: Donepezil 10 mg, trial zoloft 25 mg starting 03/17/22 (trying low-dose due to orthostatic risk).  TSH was in normal range  Reports from family that patient has been increasingly irritable.  He admitted to some irritability at last visit and we started  low-dose sertraline  A/P: Dementia has been stable-continue current medication - he reports less irritability- continue sertraline 25 mg  #CAD- with history of CABG in 2015 - follows with Dr. Daneen Schick but also with stroke history #hyperlipidemia - LDL goal under 70 S: Medication:aincreased in January to rosuvastatin 40 mg, aspirin 81 mg   -no chest pain or SOB Lab Results  Component Value Date   CHOL 139 05/16/2021   HDL 47.50 05/16/2021   LDLCALC 72 05/16/2021   LDLDIRECT 75.0 11/27/2021   TRIG 98.0 05/16/2021   CHOLHDL 3 05/16/2021   A/P: CAD asymptomatic- continue current meds. For cholesterol- recheck full lipids next visit  #hypertension/orthostatic hypotension S: medication:  none -orthostatic intolerance on ARB and beta blocker in past- and BP has trended down. Still with some orthostatic issues on tamsulosin BP Readings from Last 3 Encounters:  04/21/22 118/60  03/17/22 140/70  03/05/22 (!) 171/81   A/P: Controlled. Continue off prior BP meds  # Trouble swallowing S:mentions mild intermittent swallowing issues- if drinks water or beverages goes right down . Occasional morning nausea A/P: if any worsening he will let us know- may refer to GI for potential endoscopy   # BPH S:history of severe UTI leading to hospitalization. Orthostatic issues but feel we have to leave him on flomax  A/P: he asks about stopping flomax- recommended againstwith prior hospitalization- he agrees to continue  # B12 deficiency S: Current treatment/medication (oral vs. IM):  1047mg injections montly A/P: well controlled in past- give injection today   Recommended follow up: Return in about 3 months (around 07/22/2022) for followup or sooner if needed.Schedule b4 you leave. Future Appointments  Date Time Provider [place] 05/22/2022 10:00 AM HMarin Olp MD [facility]  09/04/2022  9:30 AM WRondel Jumbo PA-C [facility] None  10/16/2022  1:45 PM [facility] HEALTH COACH [facility]    Lab/Order associations:   ICD-10-CM   1. Coronary artery disease involving coronary bypass graft of native heart without angina pectoris  I25.810 VAS [place]    2. Essential hypertension  I10 VAS [place]    3. Hyperlipidemia, unspecified hyperlipidemia type  E78.5 VAS [place]    4. History of stroke  Z86.73 VAS [place]      No orders of the defined types were placed in this encounter.   Return precautions advised.  SGarret Reddish MD

## 2022-04-23 ENCOUNTER — Telehealth: Payer: Self-pay | Admitting: Family Medicine

## 2022-04-23 NOTE — Telephone Encounter (Signed)
Specialty is requesting a call back regarding [[id number]] Procedure: VAS US CAROTID  States they need to know if this requires a prior authorization.

## 2022-05-06 ENCOUNTER — Ambulatory Visit (HOSPITAL_COMMUNITY)
Admission: RE | Admit: 2022-05-06 | Discharge: 2022-05-06 | Disposition: A | Discharge status: Home / Self Care | Payer: Medicare Other | Source: Ambulatory Visit | Attending: Family Medicine | Admitting: Family Medicine

## 2022-05-06 DIAGNOSIS — E785 Hyperlipidemia, unspecified: Secondary | ICD-10-CM | POA: Diagnosis not present

## 2022-05-06 DIAGNOSIS — I25810 Atherosclerosis of coronary artery bypass graft(s) without angina pectoris: Secondary | ICD-10-CM | POA: Diagnosis not present

## 2022-05-06 DIAGNOSIS — I10 Essential (primary) hypertension: Secondary | ICD-10-CM | POA: Diagnosis not present

## 2022-05-06 DIAGNOSIS — Z8673 Personal history of transient ischemic attack (TIA), and cerebral infarction without residual deficits: Secondary | ICD-10-CM | POA: Diagnosis not present

## 2022-05-06 NOTE — Progress Notes (Signed)
Carotid artery duplex has been completed. Preliminary results can be found in CV Proc through chart review.   05/06/22 10:12 AM Timothy Gallagher RVT

## 2022-05-21 ENCOUNTER — Ambulatory Visit: Payer: Medicare Other

## 2022-05-22 ENCOUNTER — Ambulatory Visit (INDEPENDENT_AMBULATORY_CARE_PROVIDER_SITE_OTHER): Payer: Medicare Other | Admitting: Family Medicine

## 2022-05-22 ENCOUNTER — Ambulatory Visit: Payer: Medicare Other | Admitting: Family Medicine

## 2022-05-22 ENCOUNTER — Encounter: Payer: Self-pay | Admitting: Family Medicine

## 2022-05-22 VITALS — BP 102/64 | HR 70 | Temp 98.6°F | Ht 74.0 in | Wt 184.6 lb

## 2022-05-22 DIAGNOSIS — I25810 Atherosclerosis of coronary artery bypass graft(s) without angina pectoris: Secondary | ICD-10-CM

## 2022-05-22 DIAGNOSIS — E785 Hyperlipidemia, unspecified: Secondary | ICD-10-CM

## 2022-05-22 DIAGNOSIS — E538 Deficiency of other specified B group vitamins: Secondary | ICD-10-CM

## 2022-05-22 DIAGNOSIS — R1310 Dysphagia, unspecified: Secondary | ICD-10-CM

## 2022-05-22 DIAGNOSIS — I10 Essential (primary) hypertension: Secondary | ICD-10-CM

## 2022-05-22 MED ORDER — CYANOCOBALAMIN 1000 MCG/ML IJ SOLN
1000.0000 ug | Freq: Once | INTRAMUSCULAR | Status: AC
  Administered 2022-05-22: 1000 ug via INTRAMUSCULAR

## 2022-05-22 NOTE — Progress Notes (Signed)
Pt tolerated b12 well. 

## 2022-05-22 NOTE — Progress Notes (Signed)
Phone [phone] In person visit   Subjective:   Timothy Gallagher is a 86 y.o. year old very pleasant male patient who presents for/with See problem oriented charting Chief Complaint  Patient presents with   Follow-up   Hypertension   Hyperlipidemia   b12 deficiency   Past Medical History-  Patient Active Problem List   Diagnosis Date Noted   Mixed vascular and neurodegenerative dementia without behavioral disturbance ([place]) 11/28/2021    Priority: High   Hyperthyroidism 01/13/2018    Priority: High   Detached retina, left 06/30/2017    Priority: High   Infrarenal abdominal aortic aneurysm (AAA) without rupture ([place]) 05/12/2017    Priority: High   CAD (coronary artery disease) of artery bypass graft 05/19/2014    Priority: High   Abdominal aortic aneurysm (AAA) without rupture, unspecified part ([place]) 11/27/2021    Priority: Medium    History of atrial fibrillation 08/26/2019    Priority: Medium    BPPV (benign paroxysmal positional vertigo) 09/01/2016    Priority: Medium    Insomnia 11/22/2014    Priority: Medium    Anemia 07/20/2014    Priority: Medium    BPH (benign prostatic hyperplasia) 12/05/2013    Priority: Medium    Hyperlipidemia 03/13/2008    Priority: Medium    B12 deficiency 06/07/2007    Priority: Medium    Essential hypertension 04/09/2007    Priority: Medium    Chronic obstructive pulmonary disease ([place]) 08/26/2019    Priority: Low   Left sided abdominal pain 04/15/2017    Priority: Low   Low back pain 02/06/2017    Priority: Low   Anal fissure 04/07/2016    Priority: Low   Internal and external bleeding hemorrhoids 01/22/2015    Priority: Low   Palpitations 02/11/2013    Priority: Low   OSTEOARTHRITIS, WRIST, RIGHT 08/05/2010    Priority: Low   ACNE ROSACEA 06/27/2009    Priority: Low   ESOPHAGEAL STRICTURE 04/27/2009    Priority: Low   ACTINIC KERATOSIS, HEAD 04/18/2009    Priority: Low   NECK PAIN 09/14/2008    Priority: Low    NEUROPATHY, IDIOPATHIC PERIPHERAL NEC 08/11/2007    Priority: Low   Irritable bowel syndrome 08/11/2007    Priority: Low   ALLERGIC RHINITIS 04/09/2007    Priority: Low    Medications- reviewed and updated Current Outpatient Medications  Medication Sig Dispense Refill   aspirin 81 MG EC tablet Take 1 tablet (81 mg total) by mouth daily.     donepezil (ARICEPT) 10 MG tablet Take 1 tablet (10 mg total) by mouth daily. 90 tablet 3   methimazole (TAPAZOLE) 5 MG tablet Take 1 tablet (5 mg total) by mouth daily. 90 tablet 3   Multiple Vitamins-Minerals (CENTRUM SILVER PO) Take 1 tablet by mouth daily.     rosuvastatin (CRESTOR) 40 MG tablet Take 1 tablet (40 mg total) by mouth daily. Dose increase 01/13/22 due to heart disease and LDL goal under 70 90 tablet 3   sertraline (ZOLOFT) 25 MG tablet Take 1 tablet (25 mg total) by mouth daily. 30 tablet 3   tamsulosin (FLOMAX) 0.4 MG CAPS capsule Take 0.4 mg by mouth. Prescribed by urology     No current facility-administered medications for this visit.     Objective:  BP 102/64   Pulse 70   Temp 98.6 F (37 C)   Ht '6\' 2"'$  (1.88 m)   Wt 184 lb 9.6 oz (83.7 kg)   SpO2 96%  BMI 23.70 kg/m  Gen: NAD, resting comfortably CV: RRR no murmurs rubs or gallops Lungs: CTAB no crackles, wheeze, rhonchi  Ext: no edema Skin: warm, dry    Assessment and Plan   # trouble swallowing S:patient has told me last 2 visits he is having trouble swallowing at least twice a week. He reflects back and thinks perhaps up to a year. Overall stable  A/P: Will refer to GI for their opinion- ? endoscopy  #Dementia likely due to mixed vascular and Alzheimer's dementia-follows with Dr. Toy Baker, PA #irritability- trial zoloft 25 mg starting 03/17/22 S: Medication: Donepezil 10 mg -Most recent MRI 12/19/2021 prior cerebellar stroke  Patient admitted to increased irritability in may- was started on zoloft 25 mg and has found this helpful A/P: dementia  overall stable. Feels less irritable on zoloft 20 mg- continue current meds    #CAD- with history of CABG in 2015 - follows with Dr. Daneen Schick #hyperlipidemia - LDL goal under 70 S: Medication: rosuvastatin 81 mg, aspirin 81 mg   -no chest pain or shortness of breath Lab Results  Component Value Date   CHOL 139 05/16/2021   HDL 47.50 05/16/2021   LDLCALC 72 05/16/2021   LDLDIRECT 75.0 11/27/2021   TRIG 98.0 05/16/2021   CHOLHDL 3 05/16/2021   A/P:  due for lipid panel- he states doesn't have time for labs today- wants to do at September visit  %hyperthyroidism- follows with Dr. Loanne Drilling S: compliant On thyroid medication- methimazole 5 mg daily Lab Results  Component Value Date   TSH 3.86 02/11/2022  A/P:Patient will be transitioning within [place]- I will likely check tsh at least with next labs  in september  #hypertension/orthostatic hypotension S: medication:  none -orthostatic intolerance on ARB and beta blocker in past- and BP has trended down. Still with some orthostatic issues on tamsulosin A/P: blood pressure runs rather low honestly now- continue to monitor  # AAA S:Stable 3.3 cm infrarenal AAA on 01/24/20- repeat 3 years planned  A/P: has been [place]- repeat next year   # BPH S:history of severe UTI leading to hospitalization. Orthostatic issues but feel we have to leave him on flomax  A/P: he states reasonably controlled- cotninue current meds   # B12 deficiency S: Current treatment/medication (oral vs. IM): received 103mg injections monthly Lab Results  Component Value Date   VITAMINB12 >1550 ([place]) 05/16/2021  A/P: Controlled. Continue current medications- update b12 next visit   # Hyperglycemia/insulin resistance/prediabetes- peak a1c of 6.4 S:  Medication: none Lab Results  Component Value Date   HGBA1C 6.4 11/27/2021   A/P: will check a1c next visit- continue current meds  Recommended follow up: Return for next already scheduled visit or sooner if  needed. Future Appointments  Date Time Provider [place] 06/25/2022 10:00 AM [facility] NURSE [facility]  07/22/2022 10:20 AM HMarin Olp MD [facility]  09/04/2022  9:30 AM WRondel Jumbo PA-C [facility] None  10/16/2022  1:45 PM [facility] HEALTH COACH [facility]   Lab/Order associations:   ICD-10-CM   1. B12 deficiency  E53.8 cyanocobalamin ((VITAMIN B-12)) injection 1,000 mcg    2. Dysphagia, unspecified type  R13.10 Ambulatory referral to Gastroenterology    3. Coronary artery disease involving coronary bypass graft of native heart without angina pectoris  I25.810     4. Hyperlipidemia, unspecified hyperlipidemia type  E78.5     5. Essential hypertension  I10       Meds ordered this encounter  Medications   cyanocobalamin ((VITAMIN B-12)) injection 1,000 mcg    Return precautions advised.  [name], MD

## 2022-05-22 NOTE — Patient Instructions (Addendum)
[facility] contact (due to trouble swallowing) Please call to schedule visit Address: [address], [place], [place] [address] Phone: [phone]   Recommended follow up: Return for next already scheduled visit or sooner if needed.

## 2022-05-23 ENCOUNTER — Encounter: Payer: Self-pay | Admitting: Physician Assistant

## 2022-06-17 ENCOUNTER — Encounter: Payer: Self-pay | Admitting: Physician Assistant

## 2022-06-17 ENCOUNTER — Ambulatory Visit: Payer: Medicare Other | Admitting: Physician Assistant

## 2022-06-17 VITALS — BP 98/50 | HR 68 | Ht 71.0 in | Wt 182.4 lb

## 2022-06-17 DIAGNOSIS — R42 Dizziness and giddiness: Secondary | ICD-10-CM | POA: Diagnosis not present

## 2022-06-17 DIAGNOSIS — R1013 Epigastric pain: Secondary | ICD-10-CM | POA: Diagnosis not present

## 2022-06-17 DIAGNOSIS — R1310 Dysphagia, unspecified: Secondary | ICD-10-CM

## 2022-06-17 MED ORDER — OMEPRAZOLE 20 MG PO CPDR: 20.0000 mg | DELAYED_RELEASE_CAPSULE | Freq: Every day | ORAL | 5 refills | Status: DC

## 2022-06-17 NOTE — Patient Instructions (Signed)
If you are age 84 or older, your body mass index should be between 23-30. Your Body mass index is 25.44 kg/m. If this is out of the aforementioned range listed, please consider follow up with your Primary Care Provider.  If you are age 16 or younger, your body mass index should be between 19-25. Your Body mass index is 25.44 kg/m. If this is out of the aformentioned range listed, please consider follow up with your Primary Care Provider.   We have sent the following medications to your pharmacy for you to pick up at your convenience: Omeprazole 20 mg    You have been scheduled for a Barium Esophogram at [facility] (1st floor of the hospital) on 06/24/22 at 9:30am. Please arrive 30 minutes prior to your appointment for registration. Make certain not to have anything to eat or drink 3 hours prior to your test. If you need to reschedule for any reason, please contact radiology at [phone] to do so. __________________________________________________________________ A barium swallow is an examination that concentrates on views of the esophagus. This tends to be a double contrast exam (barium and two liquids which, when combined, create a gas to distend the wall of the oesophagus) or single contrast (non-ionic iodine based). The study is usually tailored to your symptoms so a good history is essential. Attention is paid during the study to the form, structure and configuration of the esophagus, looking for functional disorders (such as aspiration, dysphagia, achalasia, motility and reflux) EXAMINATION You may be asked to change into a gown, depending on the type of swallow being performed. A radiologist and radiographer will perform the procedure. The radiologist will advise you of the type of contrast selected for your procedure and direct you during the exam. You will be asked to stand, sit or lie in several different positions and to hold a small amount of fluid in your mouth before being  asked to swallow while the imaging is performed .In some instances you may be asked to swallow barium coated marshmallows to assess the motility of a solid food bolus. The exam can be recorded as a digital or video fluoroscopy procedure. POST PROCEDURE It will take 1-2 days for the barium to pass through your system. To facilitate this, it is important, unless otherwise directed, to increase your fluids for the next 24-48hrs and to resume your normal diet.  This test typically takes about 30 minutes to perform.  The [facility] providers would like to encourage you to use [facility] to communicate with providers for non-urgent requests or questions.  Due to long hold times on the telephone, sending your provider a message by [facility] may be a faster and more efficient way to get a response.  Please allow 48 business hours for a response.  Please remember that this is for non-urgent requests.   It was a pleasure to see you today!  Thank you for trusting me with your gastrointestinal care!    Ellouise Newer, PA-C

## 2022-06-17 NOTE — Progress Notes (Signed)
Chief Complaint: Dysphagia  HPI:    Timothy Gallagher is an 86 year old male with a past medical history as listed below including dementia, esophageal stricture and CAD status post CABG in 2015, known to Dr. [name], who was referred to me by [name], MD for a complaint of dysphagia.      06/06/2002 EGD for dysphagia with an esophageal stricture and chronic gastritis.    04/22/2017 patient seen in clinic by Dr. [name] for rectal bleeding.  At that time diagnosed with internal and external hemorrhoids.  Started fiber.    05/22/2022 office visit with PCP.  They discussed dementia likely due to mixed vascular and Alzheimer's and also trouble swallowing which she had mentioned on the past 2 visits.  Described trouble swallowing at least twice a week over the past year.    Today, the patient presents to clinic accompanied by his wife who assists with history.  They explained that he has had some trouble swallowing typically 1 or 2 days out of the week over the past few months.  Patient's wife tells me he even had trouble swallowing liquids about 2 weeks ago, but that all seems better.  In fact over this past week he has had no trouble at all.  He denies any overt heartburn or reflux symptoms.    They also complain the patient has been dizzy on a daily basis for weeks.    Denies fever, chills, weight loss or abdominal pain.  Past Medical History:  Diagnosis Date   ACNE ROSACEA 06/27/2009   ACTINIC KERATOSIS, HEAD 04/18/2009   Acute maxillary sinusitis 05/14/2010   ALLERGIC RHINITIS 04/09/2007   Anal fissure 04/07/2016   B12 DEFICIENCY 06/07/2007   BACK PAIN WITH RADICULOPATHY 04/24/2008   Cancer ([place])    skin   CHEST WALL PAIN, ACUTE 06/15/2009   Chronic maxillary sinusitis 05/29/2008   COLITIS 04/27/2009   COLONIC POLYPS, HX OF 04/27/2009   tubular adenomas   Coronary artery disease 05/12/2014   Cath 05/12/2014 w/ severe 3-vessel CAD and preserved LV function, EF 55%   DERMATITIS, ATOPIC 10/12/2007    DIVERTICULOSIS, COLON 04/27/2009   ECCHYMOSES, SPONTANEOUS 06/27/2008   Elevated sedimentation rate 05/02/2009   ESOPHAGEAL STRICTURE 04/27/2009   GASTRITIS, CHRONIC 04/27/2009   HYPERLIPIDEMIA 03/13/2008   HYPERTENSION 04/09/2007   Internal bleeding hemorrhoids 01/22/2015   01/22/2015 Seen at anoscopy, grade 1 all 3 positions    Irritable bowel syndrome 08/11/2007   KIDNEY DISEASE 04/09/2007   NECK PAIN 09/14/2008   NEUROPATHY, IDIOPATHIC PERIPHERAL NEC 08/11/2007   OSTEOARTHRITIS, WRIST, RIGHT 08/05/2010   Postoperative delirium 05/20/2014   S/P CABG x 4 05/19/2014   LIMA to LAD, SVG to diag, SVG to OM, SVG to PDA, EVH via right thigh and leg    Past Surgical History:  Procedure Laterality Date   CARDIAC CATHETERIZATION     COLONOSCOPY     CORONARY ARTERY BYPASS GRAFT N/A 05/19/2014   Procedure: CORONARY ARTERY BYPASS GRAFTING (CABG);  Surgeon: Rexene Alberts, MD;  Location: [facility];  Service: Open Heart Surgery;  Laterality: N/A;  Times 4 using left internal mammary artery and endoscopically harvested right saphenous vein   ESOPHAGOGASTRODUODENOSCOPY     FINGER SURGERY     cut off end of finger   FLEXIBLE SIGMOIDOSCOPY     HEMORRHOID BANDING     HERNIA REPAIR     INCISION AND DRAINAGE WOUND WITH FOREIGN BODY REMOVAL Left 12/20/2013   Procedure: INCISION AND DRAINAGE LEFT INDEX FINGER;  Surgeon: Tennis Must, MD;  Location: [facility];  Service: Orthopedics;  Laterality: Left;   INTRAOPERATIVE TRANSESOPHAGEAL ECHOCARDIOGRAM N/A 05/19/2014   Procedure: INTRAOPERATIVE TRANSESOPHAGEAL ECHOCARDIOGRAM;  Surgeon: Rexene Alberts, MD;  Location: [facility];  Service: Open Heart Surgery;  Laterality: N/A;   lamenectomy     LEFT HEART CATHETERIZATION WITH CORONARY ANGIOGRAM N/A 05/12/2014   Procedure: LEFT HEART CATHETERIZATION WITH CORONARY ANGIOGRAM;  Surgeon: Sinclair Grooms, MD;  Location: [facility] C[facility];  Service: Cardiovascular;  Laterality: N/A;   LUMBAR FUSION     TONSILLECTOMY     VARICOSE VEIN SURGERY Left      Current Outpatient Medications  Medication Sig Dispense Refill   aspirin 81 MG EC tablet Take 1 tablet (81 mg total) by mouth daily.     donepezil (ARICEPT) 10 MG tablet Take 1 tablet (10 mg total) by mouth daily. 90 tablet 3   methimazole (TAPAZOLE) 5 MG tablet Take 1 tablet (5 mg total) by mouth daily. 90 tablet 3   Multiple Vitamins-Minerals (CENTRUM SILVER PO) Take 1 tablet by mouth daily.     rosuvastatin (CRESTOR) 40 MG tablet Take 1 tablet (40 mg total) by mouth daily. Dose increase 01/13/22 due to heart disease and LDL goal under 70 90 tablet 3   sertraline (ZOLOFT) 25 MG tablet Take 1 tablet (25 mg total) by mouth daily. 30 tablet 3   tamsulosin (FLOMAX) 0.4 MG CAPS capsule Take 0.4 mg by mouth. Prescribed by urology     No current facility-administered medications for this visit.    Allergies as of 06/17/2022 - Review Complete 05/22/2022  Allergen Reaction Noted   Lipitor [atorvastatin] Other (See Comments) 03/21/2008   Trazodone and nefazodone Other (See Comments) 05/28/2015   Ciprofloxacin Swelling 04/09/2007   Mycophenolate mofetil Other (See Comments) 04/09/2007   Amoxicillin Rash 04/09/2007   Penicillins Rash 12/20/2013   Rosuvastatin Other (See Comments) 03/28/2008    Family History  Problem Relation Age of Onset   Hernia Mother    Heart disease Father        smoker   COPD Father    Heart attack Brother    Early death Daughter    Colon cancer Neg Hx     Social History   Socioeconomic History   Marital status: Married    Spouse name: Not on file   Number of children: 4   Years of education: 11   Highest education level: Not on file  Occupational History   Occupation: retired    Fish farm manager: RETIRED    Comment: from [place] Use   Smoking status: Former    Packs/day: 0.50    Years: 42.00    Total pack years: 21.00    Types: Cigarettes    Quit date: 11/17/1998    Years since quitting: 23.5   Smokeless tobacco: Never  Vaping Use   Vaping  Use: Never used  Substance and Sexual Activity   Alcohol use: No   Drug use: No   Sexual activity: Yes  Other Topics Concern   Not on file  Social History Narrative   Married 35 years (2nd marriage). 2 kids from previous marriage (lost a 44rd child hit by car at age 46). 2 stepchildren. 6 grandkids. 2 greatgrandkids.       Retired from [employer]: walking/exercise, building in shop-furniture (cut finger off in February doing this)   6 grand children - Has bought them all a new  car    Brother died of probably addiction   Sister also died; not sure of cause    He enjoys his life; Likes to play the keyboard; guitar    Had played in a gospel quartet    Enjoys working with wood    Right handed   Drinks caffeine   Social Determinants of Health   Financial Resource Strain: Low Risk  (10/03/2021)   Overall Financial Resource Strain (CARDIA)    Difficulty of Paying Living Expenses: Not hard at all  Food Insecurity: No Food Insecurity (10/03/2021)   Hunger Vital Sign    Worried About Running Out of Food in the Last Year: Never true    Ran Out of Food in the Last Year: Never true  Transportation Needs: No Transportation Needs (10/03/2021)   PRAPARE - Hydrologist (Medical): No    Lack of Transportation (Non-Medical): No  Physical Activity: Inactive (10/03/2021)   Exercise Vital Sign    Days of Exercise per Week: 0 days    Minutes of Exercise per Session: 0 min  Stress: No Stress Concern Present (10/03/2021)   [place]    Feeling of Stress : Not at all  Social Connections: Moderately Isolated (10/03/2021)   Social Connection and Isolation Panel [NHANES]    Frequency of Communication with Friends and Family: More than three times a week    Frequency of Social Gatherings with Friends and Family: More than three times a week    Attends Religious Services: Never     Marine scientist or Organizations: No    Attends Archivist Meetings: Never    Marital Status: Married  Human resources officer Violence: Not At Risk (10/03/2021)   Humiliation, Afraid, Rape, and Kick questionnaire    Fear of Current or Ex-Partner: No    Emotionally Abused: No    Physically Abused: No    Sexually Abused: No    Review of Systems:    Constitutional: No weight loss, fever or chills Skin: No rash Cardiovascular: No chest pain   Respiratory: No SOB  Gastrointestinal: See HPI and otherwise negative Genitourinary: No dysuria  Neurological: +dizziness Musculoskeletal: No new muscle or joint pain Hematologic: No bleeding  Psychiatric: No history of depression or anxiety   Physical Exam:  Vital signs: BP (!) 98/50 (BP Location: Left Arm, Patient Position: Sitting, Cuff Size: Normal)   Pulse 68 Comment: irregular  Ht '5\' 11"'$  (1.803 m) Comment: height measured without shoes  Wt 182 lb 6 oz (82.7 kg)   BMI 25.44 kg/m    Constitutional:   Pleasant Caucasian male appears to be in NAD, Well developed, Well nourished, alert and cooperative Head:  Normocephalic and atraumatic. Eyes:   PEERL, EOMI. No icterus. Conjunctiva pink. Ears:  Normal auditory acuity. Neck:  Supple Throat: Oral cavity and pharynx without inflammation, swelling or lesion.  Respiratory: Respirations even and unlabored. Lungs clear to auscultation bilaterally.   No wheezes, crackles, or rhonchi.  Cardiovascular: Normal S1, S2. No MRG. Regular rate and rhythm. No peripheral edema, cyanosis or pallor.  Gastrointestinal:  Soft, nondistended, mild epigastric TTP with some involuntary guarding,. Normal bowel sounds. No appreciable masses or hepatomegaly. Rectal:  Not performed.  Msk:  Symmetrical without gross deformities. Without edema, no deformity or joint abnormality.  Neurologic:  Alert and  oriented x4;  grossly normal neurologically.  Skin:   Dry and intact without significant lesions or  rashes. Psychiatric:  Demonstrates good judgement and reason without abnormal affect or behaviors.  RELEVANT LABS AND IMAGING: CBC    Component Value Date/Time   WBC 5.3 05/16/2021 0907   RBC 4.76 05/16/2021 0907   HGB 13.4 05/16/2021 0907   HCT 40.4 05/16/2021 0907   PLT 162.0 05/16/2021 0907   MCV 84.9 05/16/2021 0907   MCH 28.6 08/06/2020 1627   MCHC 33.1 05/16/2021 0907   RDW 16.2 (H) 05/16/2021 0907   LYMPHSABS 1.3 05/16/2021 0907   MONOABS 0.4 05/16/2021 0907   EOSABS 0.2 05/16/2021 0907   BASOSABS 0.0 05/16/2021 0907    CMP     Component Value Date/Time   NA 143 11/27/2021 0930   K 4.5 11/27/2021 0930   CL 107 11/27/2021 0930   CO2 28 11/27/2021 0930   GLUCOSE 89 11/27/2021 0930   BUN 14 11/27/2021 0930   CREATININE 0.91 11/27/2021 0930   CREATININE 0.98 08/06/2020 1627   CALCIUM 9.1 11/27/2021 0930   PROT 6.5 11/27/2021 0930   PROT 6.7 02/03/2018 0857   ALBUMIN 3.8 11/27/2021 0930   ALBUMIN 4.0 02/03/2018 0857   AST 16 11/27/2021 0930   ALT 10 11/27/2021 0930   ALKPHOS 69 11/27/2021 0930   BILITOT 0.6 11/27/2021 0930   BILITOT 0.4 02/03/2018 0857   GFRNONAA 72 08/06/2020 1627   GFRAA 83 08/06/2020 1627    Assessment: 1.  Dysphagia: History of an esophageal stricture last dilated in 2003, no overt heartburn or reflux symptoms the patient does have epigastric tenderness and trouble swallowing typically 1 to 2 days out of the week; consider gastritis +/- esophagitis +/- esophageal stricture +/- dysmotility 2.  Epigastric pain: On exam, patient unaware of this until I pressed on him; consider relation to above 3.  Dizziness: Consider possible relation to hypotension, BP 98/50 today  Plan: 1.  Due to patient's infrequency of symptoms, scheduled patient for a barium swallow with tablet for initial steps in evaluation.  Pending results could consider EGD. 2.  Started the patient on Omeprazole 20 mg every morning, 30-60 minutes before breakfast.  #30 with 5  refills. 3.  Discussed dizziness with the patient and his wife.  His blood pressure was very low today.  Recommend they keep track of this 3 times a day over the next week or so and report back to his PCP.  He may need help with his blood pressure. 4.  Patient to follow in clinic with [place] per recommendations after above.  Ellouise Newer, PA-C [facility] 06/17/2022, 11:13 AM  Cc: [name], MD

## 2022-06-19 ENCOUNTER — Ambulatory Visit (INDEPENDENT_AMBULATORY_CARE_PROVIDER_SITE_OTHER): Payer: Medicare Other | Admitting: Internal Medicine

## 2022-06-19 VITALS — BP 90/60 | HR 80 | Temp 98.6°F | Ht 71.0 in | Wt 181.6 lb

## 2022-06-19 DIAGNOSIS — R110 Nausea: Secondary | ICD-10-CM

## 2022-06-19 DIAGNOSIS — R1030 Lower abdominal pain, unspecified: Secondary | ICD-10-CM

## 2022-06-19 DIAGNOSIS — R1031 Right lower quadrant pain: Secondary | ICD-10-CM

## 2022-06-19 MED ORDER — ONDANSETRON 4 MG PO TBDP
4.0000 mg | ORAL_TABLET | Freq: Three times a day (TID) | ORAL | 0 refills | Status: DC | PRN
Start: 2022-06-19 — End: 2022-08-18

## 2022-06-19 MED ORDER — ONDANSETRON HCL 4 MG PO TABS: 4.0000 mg | ORAL_TABLET | Freq: Three times a day (TID) | ORAL | 0 refills | Status: DC | PRN

## 2022-06-19 NOTE — Progress Notes (Signed)
Timothy Gallagher is a 86 y.o. male who presents today for an office visit.  Assessment/Plan:  Overview: I am not sure he has abdominal pain he does not really have any right now and he does not seem dehydrated even though his blood pressure is in the low normal range but seems normal for him.  His physical exam was very benign he did not look ill at all but his wife was very concerned and was the only one who was really able to give a reliable history.  He did not seem ill enough to warrant going to the ER so we just did a CAT scan and lab work-up to get that approved as an outpatient and I advised her to take him to the ER if he gets worse.    Problem List Items Addressed This Visit   None Visit Diagnoses     Lower abdominal pain    -  Primary   Nausea       Right lower quadrant abdominal pain       Relevant Orders   CBC   Comp Met (CMET)   Amylase   Lipase   Lactic acid, plasma           Subjective:  HPI:  This is an 86 year old male with past medical history significant for esophageal strictures irritable bowel syndrome coronary artery disease anemia internal and external bleeding hemorrhoids and anal fissure left-sided abdominal pain infrarenal abdominal aortic aneurysm without rupture atrial fibrillation degenerated lumbar disks and mixed vascular and neurodegenerative dementia.  He presents for upset stomach for the last 2 weeks and vomiting last night. Pertinent medications include aspirin donepizil methimazole metoprolol omeprazole and sertraline.   It looks like his last colonoscopy was June 2010 and did also note diverticulosis and colon polyps.  It looks like his last EGD was back in 2003. Normally he follows with [name].  BP has been running low per patient:  BP Readings from Last 3 Encounters:  06/19/22 90/60  06/17/22 (!) 98/50  05/22/22 102/64    He c/o lower abdominal pain, wakes up with it about every morning for the last month or so.  The abd pain comes and  goes and is not present currently.   Last CT abdomen 2018 CLINICAL DATA:  Left lower quadrant abdominal tenderness for the past month. Left upper quadrant intermittent tenderness for the past year. Clinical concern for diverticulitis.   EXAM: CT ABDOMEN AND PELVIS WITH CONTRAST   TECHNIQUE: Multidetector CT imaging of the abdomen and pelvis was performed using the standard protocol following bolus administration of intravenous contrast.   CONTRAST:  166m ISOVUE-300 IOPAMIDOL (ISOVUE-300) INJECTION 61%   COMPARISON:  06/10/2016.   FINDINGS: Lower chest: Minimal right lower lobe atelectasis or scarring.   Hepatobiliary: Multiple small liver cysts without significant change. Normal appearing gallbladder.   Pancreas: Unremarkable. No pancreatic ductal dilatation or surrounding inflammatory changes.   Spleen: Normal in size without focal abnormality.   Adrenals/Urinary Tract: Multiple bilateral renal cysts. Small bilateral renal calculi. The largest is in the lower pole of the right kidney, measuring 5 mm in maximum diameter. No bladder or ureteral calculi and no hydronephrosis. Normal appearing adrenal glands.   Stomach/Bowel: Multiple sigmoid colon diverticula. No evidence of diverticulitis. No evidence of appendicitis. Normal appearing stomach and small bowel.   Vascular/Lymphatic: Atheromatous arterial calcifications. 3.3 cm infrarenal abdominal aortic aneurysm without significant change. No enlarged lymph nodes.   Reproductive: Moderately enlarged prostate gland.  Other: Small left inguinal hernia containing fat. Small umbilical hernia containing fat.   Musculoskeletal: Lumbar and lower thoracic spine degenerative changes and dextroconvex scoliosis.   IMPRESSION: 1. No acute abnormality. 2. Extensive sigmoid colon diverticulosis. 3. Small, nonobstructing bilateral renal calculi. 4. Small left inguinal hernia containing fat and small umbilical hernia  containing fat. 5. Stable 3.3 cm infrarenal abdominal aortic aneurysm. Recommend followup by ultrasound in 3 years.    He says the pain hasnt been there that long though.  He says he goes every year to get the AAA checked and it was checked last year.  H/o diverticulitis long time ago but no flares lately, Denies any fevers recently.   He takes medicine for enlarged prostate gland.  He has appt next week with Elv[facility] Sidle for esophagram for difficulty swallowing.   His GI doctor is Dr. Leroy Kennedy- but saw someeone else to stand in who ordered the barium swallow  History taking is very limited due to dementia- His wife reports mod severe symptoms.  Patient's dementia is severe enough as to prevent him from even remembering having vomiting or abdominal pain so the history is uncertain but is taken mainly from his wife who says he was complaining of a lot of abdominal pain and had vomiting last night         Objective:  Physical Exam: BP 90/60 (BP Location: Left Arm)   Pulse 80   Temp 98.6 F (37 C) (Temporal)   Ht _0  (1.803 m)   Wt 181 lb 9.6 oz (82.4 kg)   SpO2 95%   BMI 25.33 kg/m    Gen: No acute distress, resting comfortably Psych: Normal affect and thought content, but very unreliable memory/recall.   Problem specific physical exam findings:  It all 4 quadrants and he denied any pain in any of them but he did indicate sometimes he does have pain in the lower quadrants bilaterally.  He reported a little bit of dizziness with standing but it did not seem off balance when he did and his oral mucosa was very moist and pink.        Loralee Pacas, MD 06/19/2022 5:21 PM

## 2022-06-20 LAB — COMPREHENSIVE METABOLIC PANEL
ALT: 16 U/L (ref 0–53)
AST: 19 U/L (ref 0–37)
Albumin: 3.4 g/dL — ABNORMAL LOW (ref 3.5–5.2)
Alkaline Phosphatase: 65 U/L (ref 39–117)
BUN: 15 mg/dL (ref 6–23)
CO2: 26 mEq/L (ref 19–32)
Calcium: 8.8 mg/dL (ref 8.4–10.5)
Chloride: 106 mEq/L (ref 96–112)
Creatinine, Ser: 1.03 mg/dL (ref 0.40–1.50)
GFR: 66.95 mL/min (ref >60.00)
Glucose, Bld: 110 mg/dL — ABNORMAL HIGH (ref 70–99)
Potassium: 4 mEq/L (ref 3.5–5.1)
Sodium: 141 mEq/L (ref 135–145)
Total Bilirubin: 0.3 mg/dL (ref 0.2–1.2)
Total Protein: 6.3 g/dL (ref 6.0–8.3)

## 2022-06-20 LAB — CBC
HCT: 37.6 % — ABNORMAL LOW (ref 39.0–52.0)
Hemoglobin: 12.2 g/dL — ABNORMAL LOW (ref 13.0–17.0)
MCHC: 32.4 g/dL (ref 30.0–36.0)
MCV: 84.8 fl (ref 78.0–100.0)
Platelets: 215 10*3/uL (ref 150.0–400.0)
RBC: 4.44 Mil/uL (ref 4.22–5.81)
RDW: 15.5 % (ref 11.5–15.5)
WBC: 5.8 10*3/uL (ref 4.0–10.5)

## 2022-06-20 LAB — LIPASE: Lipase: 21 U/L (ref 11.0–59.0)

## 2022-06-20 LAB — LACTIC ACID, PLASMA: LACTIC ACID: 1.1 mmol/L (ref 0.4–1.8)

## 2022-06-20 LAB — AMYLASE: Amylase: 33 U/L (ref 27–131)

## 2022-06-20 NOTE — Progress Notes (Signed)
Very mild chronic anemia for years now.  Not to worry about.  Probably just need more iron in the food.  Rest of labs good.  Possibly the kidneys are getting a little worse, but there has been very mildly reduced kidney function for years.  Its technically stage 2 kidney disease. Recommend stay hydrated.   Labs didn't really reveal any cause for the abdominal pain but the lightheadedness with standing likely due to mild dehydration.  Kidneys are good enough to have contrast and CT scan... but if problem totally resolves I recommend cancelling the CT scan we planned.

## 2022-06-24 ENCOUNTER — Ambulatory Visit (HOSPITAL_COMMUNITY)
Admission: RE | Admit: 2022-06-24 | Discharge: 2022-06-24 | Disposition: A | Discharge status: Home / Self Care | Payer: Medicare Other | Source: Ambulatory Visit | Attending: Physician Assistant | Admitting: Physician Assistant

## 2022-06-24 DIAGNOSIS — K224 Dyskinesia of esophagus: Secondary | ICD-10-CM | POA: Diagnosis not present

## 2022-06-24 DIAGNOSIS — R1013 Epigastric pain: Secondary | ICD-10-CM | POA: Diagnosis not present

## 2022-06-24 DIAGNOSIS — R1310 Dysphagia, unspecified: Secondary | ICD-10-CM | POA: Insufficient documentation

## 2022-06-25 ENCOUNTER — Ambulatory Visit (INDEPENDENT_AMBULATORY_CARE_PROVIDER_SITE_OTHER): Payer: Medicare Other | Admitting: *Deleted

## 2022-06-25 DIAGNOSIS — E538 Deficiency of other specified B group vitamins: Secondary | ICD-10-CM | POA: Diagnosis not present

## 2022-06-25 MED ORDER — CYANOCOBALAMIN 1000 MCG/ML IJ SOLN
1000.0000 ug | Freq: Once | INTRAMUSCULAR | Status: AC
  Administered 2022-06-25: 1000 ug via INTRAMUSCULAR

## 2022-06-25 NOTE — Progress Notes (Signed)
Patient presents for B12 injection today. Patient received her B12 injection in Left Deltoid. Patient tolerated injection well.  Documentation entered in [facility] in [place].

## 2022-07-03 ENCOUNTER — Encounter (HOSPITAL_COMMUNITY): Payer: Self-pay

## 2022-07-03 ENCOUNTER — Ambulatory Visit (HOSPITAL_COMMUNITY)
Admission: RE | Admit: 2022-07-03 | Discharge: 2022-07-03 | Disposition: A | Discharge status: Home / Self Care | Payer: Medicare Other | Source: Ambulatory Visit | Attending: Internal Medicine | Admitting: Internal Medicine

## 2022-07-03 DIAGNOSIS — R1030 Lower abdominal pain, unspecified: Secondary | ICD-10-CM | POA: Insufficient documentation

## 2022-07-03 DIAGNOSIS — R110 Nausea: Secondary | ICD-10-CM | POA: Insufficient documentation

## 2022-07-03 DIAGNOSIS — R1031 Right lower quadrant pain: Secondary | ICD-10-CM | POA: Insufficient documentation

## 2022-07-03 DIAGNOSIS — N200 Calculus of kidney: Secondary | ICD-10-CM | POA: Diagnosis not present

## 2022-07-03 DIAGNOSIS — K5730 Diverticulosis of large intestine without perforation or abscess without bleeding: Secondary | ICD-10-CM | POA: Diagnosis not present

## 2022-07-03 MED ORDER — SODIUM CHLORIDE (PF) 0.9 % IJ SOLN
INTRAMUSCULAR | Status: AC
  Filled 2022-07-03: qty 50

## 2022-07-03 MED ORDER — IOHEXOL 300 MG/ML  SOLN: 100.0000 mL | Freq: Once | INTRAMUSCULAR | Status: DC | PRN

## 2022-07-03 MED ORDER — IOHEXOL 300 MG/ML  SOLN
100.0000 mL | Freq: Once | INTRAMUSCULAR | Status: AC | PRN
  Administered 2022-07-03: 100 mL via INTRAVENOUS

## 2022-07-05 ENCOUNTER — Other Ambulatory Visit: Payer: Self-pay | Admitting: Family Medicine

## 2022-07-07 ENCOUNTER — Encounter: Payer: Self-pay | Admitting: Internal Medicine

## 2022-07-07 DIAGNOSIS — I723 Aneurysm of iliac artery: Secondary | ICD-10-CM

## 2022-07-07 DIAGNOSIS — N200 Calculus of kidney: Secondary | ICD-10-CM

## 2022-07-07 DIAGNOSIS — K5790 Diverticulosis of intestine, part unspecified, without perforation or abscess without bleeding: Secondary | ICD-10-CM | POA: Insufficient documentation

## 2022-07-07 DIAGNOSIS — N400 Enlarged prostate without lower urinary tract symptoms: Secondary | ICD-10-CM | POA: Insufficient documentation

## 2022-07-07 HISTORY — DX: Calculus of kidney: N20.0

## 2022-07-07 HISTORY — DX: Aneurysm of iliac artery: I72.3

## 2022-07-07 HISTORY — DX: Diverticulosis of intestine, part unspecified, without perforation or abscess without bleeding: K57.90

## 2022-07-07 NOTE — Progress Notes (Signed)
CT abdomen showed a lot of the old problems but none of these really explain the recent low blood pressure, dehydration, dizziness- and abdominal pain.  Overall I suspect there is poor blood flow to the abdomen, and that is what is causing the abdominal pain.   I recommend he discuss the issue with his gastroenterologist (refer if he agrees) and [name].  Drink lots of fluid in the meantime. Also, try to increase atorvastatin from 20->40 mg daily. Share these recommendations and results with [name].   I updated the problem list already.   1. Nonobstructive bilateral nephrolithiasis measuring up to 5 mm on the right and 6 mm on the left. 2. Colonic diverticulosis with no acute diverticulitis. 3. Stable suprarenal abdominal aorta aneurysm (3.1 cm) and interval increase in size of an infrarenal abdominal aorta aneurysm (3.7 cm.) Recommend follow-up ultrasound every 2 years. This recommendation follows ACR consensus guidelines: White Paper of the ACR Incidental Findings Committee II on Vascular Findings. J Am Coll Radiol 2013; 10:789-794. 4. Aneurysmal left common iliac artery (1.6 cm). 5. Prostatomegaly.

## 2022-07-08 ENCOUNTER — Telehealth: Payer: Self-pay | Admitting: Internal Medicine

## 2022-07-08 ENCOUNTER — Telehealth: Payer: Self-pay | Admitting: Physician Assistant

## 2022-07-08 NOTE — Telephone Encounter (Signed)
Patient has called back.  I have read him Dr. Dennard Nip response to Ct.   Patient understood. Patient is going to call back later to make a follow up appt with [name].  He is going to also call Dr. [name] to schedule appt with [facility].   Please make sure updated script for atorvastatin gets sent to pharmacy.

## 2022-07-08 NOTE — Telephone Encounter (Signed)
F/yI, see results. Ok to increase med?

## 2022-07-08 NOTE — Telephone Encounter (Signed)
Patients wife called states the patient has been having a lot stomach issues and vomiting seeking advise.

## 2022-07-08 NOTE — Telephone Encounter (Signed)
Someone added metoprolol to his list on 06/19/22- if he is on this please stop this (please call and see what he is taking)- make sure to remove from med list. Tell him to liberalize his salt intake to see if this will help raise pressures some as well.

## 2022-07-08 NOTE — Telephone Encounter (Signed)
On second look- patient is on rosuvastatin 40 mg daily already- please confirm this with patient.   Tiffany added in atorvastatin 20 mg and metoprolol 25 mg XR on 06/19/22- both of these should be removed (after confirming with patient that he is not taking these- and if he is having him stop). I am not sure where these were pulled from.

## 2022-07-08 NOTE — Telephone Encounter (Signed)
Yes thanks to increasing atorvastatin as suggested in ct note - thanks daily #90 with 3 refills

## 2022-07-08 NOTE — Telephone Encounter (Signed)
Returned call to patient's wife. She states that patient has been nauseated and having intermittent vomiting for the past 3-4 weeks. No changes in medication or diet. He is still taking Omeprazole 20 mg every morning. Wife reports that pt's BP is still running low. I advised pt's wife to keep records of patient's BP readings consistently several times a day. I informed pt's wife that she should reach out to Dr. Ansel Bong nurse to make them aware. She states that pt was seen in the office and there was no concern for low BP even though she voiced her concerns. I told pt's wife that his medications may need to be adjusted, but this would be his PCPs decision. I told her to ask if pt can hold BP meds for a couple of days to see if any improvement in symptoms. Pt does have a prescription for Zofran, I told her that pt can take this every 8 hours PRN. Wife verbalized understanding and had no concerns at the end of the call.

## 2022-07-08 NOTE — Telephone Encounter (Signed)
Caller states: -returning a call from Dr. Dennard Nip assistant. -About imaging results.  Caller requests: -call back.  -Will be available all day.

## 2022-07-08 NOTE — Telephone Encounter (Signed)
From last OV note:  #hypertension/orthostatic hypotension S: medication:  none -orthostatic intolerance on ARB and beta blocker in past- and BP has trended down. Still with some orthostatic issues on tamsulosin A/P: blood pressure runs rather low honestly now- continue to monitor  Pt wife never comes to visits and does not like things discussed with her. Do you want to see pt in office regarding this?

## 2022-07-09 NOTE — Addendum Note (Signed)
Addended by: Clyde Lundborg A on: 07/09/2022 09:45 AM   Modules accepted: Orders

## 2022-07-09 NOTE — Telephone Encounter (Signed)
Called and spoke with pt and below message given. 

## 2022-07-09 NOTE — Telephone Encounter (Signed)
Called and spoke with pt wife and confirmed pt not taking Atorvastatin or Metoprolol, they have both been moved off list. Also, confirmed pt on Rosuvastatin.

## 2022-07-14 ENCOUNTER — Telehealth: Payer: Self-pay | Admitting: Physician Assistant

## 2022-07-14 NOTE — Telephone Encounter (Signed)
Patient's wife called requesting an appointment for patient as he is still having severe abdominal pain.  She said the nausea medicine helps with the nausea, but it does not help the pain subside.  She said it is not helping whatever his issue is.  Patient has been scheduled an OV with [facility] for 9/28, but they both feel he needs to be seen sooner than that.  Please call patient's wife and advise.  Thank you.

## 2022-07-14 NOTE — Telephone Encounter (Signed)
Returned call to patient's wife. She stated that she called in to schedule a follow up appt. I informed her that pt has the next available appt. I told her that she can call back periodically to see if there have been any cancellations. She has been advised that pt should go to urgent care or ER if patient is having severe symptoms. Pt's wife verbalized understanding and had no other concerns.

## 2022-07-22 ENCOUNTER — Ambulatory Visit (INDEPENDENT_AMBULATORY_CARE_PROVIDER_SITE_OTHER): Payer: Medicare Other | Admitting: Family Medicine

## 2022-07-22 ENCOUNTER — Encounter: Payer: Self-pay | Admitting: Family Medicine

## 2022-07-22 VITALS — BP 120/80 | HR 69 | Temp 97.7°F | Ht 71.0 in | Wt 178.2 lb

## 2022-07-22 DIAGNOSIS — E0590 Thyrotoxicosis, unspecified without thyrotoxic crisis or storm: Secondary | ICD-10-CM | POA: Diagnosis not present

## 2022-07-22 DIAGNOSIS — E538 Deficiency of other specified B group vitamins: Secondary | ICD-10-CM | POA: Diagnosis not present

## 2022-07-22 DIAGNOSIS — E785 Hyperlipidemia, unspecified: Secondary | ICD-10-CM

## 2022-07-22 DIAGNOSIS — R739 Hyperglycemia, unspecified: Secondary | ICD-10-CM | POA: Diagnosis not present

## 2022-07-22 DIAGNOSIS — I25810 Atherosclerosis of coronary artery bypass graft(s) without angina pectoris: Secondary | ICD-10-CM | POA: Diagnosis not present

## 2022-07-22 LAB — COMPREHENSIVE METABOLIC PANEL
ALT: 16 U/L (ref 0–53)
AST: 21 U/L (ref 0–37)
Albumin: 3.7 g/dL (ref 3.5–5.2)
Alkaline Phosphatase: 76 U/L (ref 39–117)
BUN: 13 mg/dL (ref 6–23)
CO2: 28 mEq/L (ref 19–32)
Calcium: 9.4 mg/dL (ref 8.4–10.5)
Chloride: 105 mEq/L (ref 96–112)
Creatinine, Ser: 1.03 mg/dL (ref 0.40–1.50)
GFR: 66.91 mL/min (ref >60.00)
Glucose, Bld: 86 mg/dL (ref 70–99)
Potassium: 5.3 mEq/L — ABNORMAL HIGH (ref 3.5–5.1)
Sodium: 141 mEq/L (ref 135–145)
Total Bilirubin: 0.4 mg/dL (ref 0.2–1.2)
Total Protein: 7.2 g/dL (ref 6.0–8.3)

## 2022-07-22 LAB — CBC WITH DIFFERENTIAL/PLATELET
Basophils Absolute: 0 10*3/uL (ref 0.0–0.1)
Basophils Relative: 0.4 % (ref 0.0–3.0)
Eosinophils Absolute: 0.1 10*3/uL (ref 0.0–0.7)
Eosinophils Relative: 2 % (ref 0.0–5.0)
HCT: 40.1 % (ref 39.0–52.0)
Hemoglobin: 13.3 g/dL (ref 13.0–17.0)
Lymphocytes Relative: 23.1 % (ref 12.0–46.0)
Lymphs Abs: 1.6 10*3/uL (ref 0.7–4.0)
MCHC: 33 g/dL (ref 30.0–36.0)
MCV: 85.7 fl (ref 78.0–100.0)
Monocytes Absolute: 0.5 10*3/uL (ref 0.1–1.0)
Monocytes Relative: 7.7 % (ref 3.0–12.0)
Neutro Abs: 4.7 10*3/uL (ref 1.4–7.7)
Neutrophils Relative %: 66.8 % (ref 43.0–77.0)
Platelets: 179 10*3/uL (ref 150.0–400.0)
RBC: 4.68 Mil/uL (ref 4.22–5.81)
RDW: 16.5 % — ABNORMAL HIGH (ref 11.5–15.5)
WBC: 7.1 10*3/uL (ref 4.0–10.5)

## 2022-07-22 LAB — LIPID PANEL
Cholesterol: 107 mg/dL (ref 0–200)
HDL: 41.9 mg/dL (ref >39.00)
LDL Cholesterol: 48 mg/dL (ref 0–99)
NonHDL: 64.68
Total CHOL/HDL Ratio: 3
Triglycerides: 85 mg/dL (ref 0.0–149.0)
VLDL: 17 mg/dL (ref 0.0–40.0)

## 2022-07-22 LAB — T4, FREE: Free T4: 0.76 ng/dL (ref 0.60–1.60)

## 2022-07-22 LAB — HEMOGLOBIN A1C: Hgb A1c MFr Bld: 6.5 % (ref 4.6–6.5)

## 2022-07-22 LAB — TSH: TSH: 5.6 u[IU]/mL — ABNORMAL HIGH (ref 0.35–5.50)

## 2022-07-22 LAB — VITAMIN B12: Vitamin B-12: >1500 pg/mL — ABNORMAL HIGH (ref 211–911)

## 2022-07-22 MED ORDER — FLUOXETINE HCL 10 MG PO CAPS: 10.0000 mg | ORAL_CAPSULE | Freq: Every day | ORAL | 11 refills | Status: DC

## 2022-07-22 MED ORDER — CYANOCOBALAMIN 1000 MCG/ML IJ SOLN
1000.0000 ug | Freq: Once | INTRAMUSCULAR | Status: AC
  Administered 2022-07-22: 1000 ug via INTRAMUSCULAR

## 2022-07-22 NOTE — Patient Instructions (Addendum)
B12 at end of visit  Stop sertraline 25 mg after dose today, start citalopram 10 mg tomorrow. I am hoping this helps the stomach- I am glad he had reassuring CT scan. Reflux could also cause nausea but would think more at night - may be worth a trial of meds if not improving- or could refer back to gastroenterology  Please stop by lab before you go If you have mychart- we will send your results within 3 business days of [place] receiving them.  If you do not have mychart- we will call you about results within 5 business days of [place] receiving them.  *please also note that you will see labs on mychart as soon as they post. I will later go in and write notes on them- will say "notes from [name]"   Recommended follow up: Return in about 3 months (around 10/21/2022) for followup or sooner if needed.Schedule b4 you leave.

## 2022-07-22 NOTE — Addendum Note (Signed)
Addended by: Clyde Lundborg A on: 07/22/2022 11:18 AM   Modules accepted: Orders

## 2022-07-22 NOTE — Progress Notes (Signed)
Phone [phone] In person visit   Subjective:   Timothy Gallagher is a 86 y.o. year old very pleasant male patient who presents for/with See problem oriented charting Chief Complaint  Patient presents with   Follow-up   Hypertension   Nausea    Pt wife states pt wakes up nauseated every morning and this has been going on for 2 months now and has experienced weight loss.   bruise on arm    Pt c/o bruise on right for arm that's tender.   Past Medical History-  Patient Active Problem List   Diagnosis Date Noted   Mixed vascular and neurodegenerative dementia without behavioral disturbance ([place]) 11/28/2021    Priority: High   Hyperthyroidism 01/13/2018    Priority: High   Detached retina, left 06/30/2017    Priority: High   AAA (abdominal aortic aneurysm) ([place]) 05/12/2017    Priority: High   CAD (coronary artery disease) of artery bypass graft 05/19/2014    Priority: High   Hyperglycemia 07/22/2022    Priority: Medium    Abdominal aortic aneurysm (AAA) without rupture, unspecified part ([place]) 11/27/2021    Priority: Medium    History of atrial fibrillation 08/26/2019    Priority: Medium    BPPV (benign paroxysmal positional vertigo) 09/01/2016    Priority: Medium    Insomnia 11/22/2014    Priority: Medium    Anemia 07/20/2014    Priority: Medium    BPH (benign prostatic hyperplasia) 12/05/2013    Priority: Medium    Hyperlipidemia 03/13/2008    Priority: Medium    B12 deficiency 06/07/2007    Priority: Medium    Essential hypertension 04/09/2007    Priority: Medium    Chronic obstructive pulmonary disease ([place]) 08/26/2019    Priority: Low   Left sided abdominal pain 04/15/2017    Priority: Low   Low back pain 02/06/2017    Priority: Low   Anal fissure 04/07/2016    Priority: Low   Internal and external bleeding hemorrhoids 01/22/2015    Priority: Low   Palpitations 02/11/2013    Priority: Low   OSTEOARTHRITIS, WRIST, RIGHT 08/05/2010    Priority: Low   ACNE  ROSACEA 06/27/2009    Priority: Low   ESOPHAGEAL STRICTURE 04/27/2009    Priority: Low   ACTINIC KERATOSIS, HEAD 04/18/2009    Priority: Low   NECK PAIN 09/14/2008    Priority: Low   NEUROPATHY, IDIOPATHIC PERIPHERAL NEC 08/11/2007    Priority: Low   Irritable bowel syndrome 08/11/2007    Priority: Low   ALLERGIC RHINITIS 04/09/2007    Priority: Low   Bilateral nephrolithiasis 07/07/2022   Diverticular disease 07/07/2022   Iliac aneurysm ([place]) 07/07/2022   Enlarged prostate 07/07/2022   Degeneration of lumbar intervertebral disc 10/21/2018    Medications- reviewed and updated Current Outpatient Medications  Medication Sig Dispense Refill   aspirin 81 MG EC tablet Take 1 tablet (81 mg total) by mouth daily.     cyclopentolate (CYCLODRYL,CYCLOGYL) 1 % ophthalmic solution PLACE 1 DROP INTO LEFT EYE BID     donepezil (ARICEPT) 10 MG tablet Take 1 tablet (10 mg total) by mouth daily. 90 tablet 3   FLUoxetine (PROZAC) 10 MG capsule Take 1 capsule (10 mg total) by mouth daily. 30 capsule 11   fluticasone (FLONASE) 50 MCG/ACT nasal spray SHAKE LQ AND U 1 TO 2 SPRAYS IEN D PRF ALLERGIES     methimazole (TAPAZOLE) 5 MG tablet Take 1 tablet (5 mg total) by mouth daily. [place]  tablet 3   Multiple Vitamins-Minerals (CENTRUM SILVER PO) Take 1 tablet by mouth daily.     omeprazole (PRILOSEC) 20 MG capsule Take 1 capsule (20 mg total) by mouth daily. 30 capsule 5   ondansetron (ZOFRAN-ODT) 4 MG disintegrating tablet Take 1 tablet (4 mg total) by mouth every 8 (eight) hours as needed for nausea or vomiting. 20 tablet 0   rosuvastatin (CRESTOR) 40 MG tablet Take 1 tablet (40 mg total) by mouth daily. Dose increase 01/13/22 due to heart disease and LDL goal under 70 90 tablet 3   tamsulosin (FLOMAX) 0.4 MG CAPS capsule Take 0.4 mg by mouth. Prescribed by urology     No current facility-administered medications for this visit.     Objective:  BP 120/80   Pulse 69   Temp 97.7 F (36.5 C)   Ht 5'  11" (1.803 m)   Wt 178 lb 3.2 oz (80.8 kg)   SpO2 96%   BMI 24.85 kg/m  Gen: NAD, resting comfortably CV: RRR no murmurs rubs or gallops Lungs: CTAB no crackles, wheeze, rhonchi Abdomen: soft/nontender/nondistended/normal bowel sounds. Ext: no edema Skin: warm, dry    Assessment and Plan   # Nausea S:wakes up nauseated every morning for last 2-3 months per wife . We did start sertraline 25 mg back in may. Occasional abdominal pain- usually periumbilical . Has vomited once a week but feels like it 3x a week. No nighttime issues.  -reassuring CT 07/03/22 with Dr. Randol Kern A/P: ? Medication side effect as started within a month of starting sertraline- Stop sertraline 25 mg after dose today, start citalopram 10 mg tomorrow. I am hoping this helps the stomach- I am glad he had reassuring CT scan. Reflux could also cause nausea but would think more at night - may be worth a trial of meds if not improving- or could refer back to gastroenterology   # Hematoma on right forearm- hit about a week and half ago- does have tenderness- not getting worse- we discussed should gradually resolve but if worsens to let us know- can refer to sports medicine to consider ultrasound.   #Dementia likely due to mixed vascular and Alzheimer's dementia-follows with Dr. Delice Lesch or Sharene Butters, PA #irritability- trial zoloft 25 mg starting 03/17/22 S: Medication: Donepezil 10 mg -Most recent MRI 12/19/2021 prior cerebellar stroke -mild worsening per wife A/P: dementia noted- continue current meds and neurology follow up. See above but switching SSRI to see if helps with nausea -more repetitive   #CAD- with history of CABG in 2015 - follows with Dr. Daneen Schick #hyperlipidemia - LDL goal under 70 S: Medication: rosuvastatin 81 mg, aspirin 81 mg   Lab Results  Component Value Date   CHOL 139 05/16/2021   HDL 47.50 05/16/2021   LDLCALC 72 05/16/2021   LDLDIRECT 75.0 11/27/2021   TRIG 98.0 05/16/2021   CHOLHDL 3  05/16/2021   A/P: CAD- asymptomatic- continue current meds lipids-hopefully stable- update lipid panel today. Continue current meds for now  %hyperthyroidism- follows with Dr. Loanne Drilling- will be seeing Dr. Cruzita Lederer S: compliant On thyroid medication- methimazole 5 mg daily Lab Results  Component Value Date   TSH 3.86 02/11/2022  A/P hopefully stable- update TSH, free t4 today. Continue current meds for now   #hypertension/orthostatic hypotension S: medication:  none -orthostatic intolerance on ARB and beta blocker in past- and BP has trended down. Still with some orthostatic issues on tamsulosin BP Readings from Last 3 Encounters:  07/22/22 120/80  06/19/22 90/60  06/17/22 (!) 98/50  A/P: blood pressure controlled but still with orthostatic issues- unfortunately prior history of severe UTI leading to hospitalization. Orthostatic issues but feel we have to leave him on flomax   # B12 deficiency S: Current treatment/medication (oral vs. IM):  1038mg injections montly Lab Results  Component Value Date   VITAMINB12 >1550 (H) 05/16/2021  A/P: update b12 today- also will receive b12 today.   # Hyperglycemia/insulin resistance/prediabetes- peak a1c of 6.4 S:  Medication: none Lab Results  Component Value Date   HGBA1C 6.4 11/27/2021   HGBA1C 6.4 05/16/2021   HGBA1C 6.3 (H) 05/17/2014  A/P: hopefully stable or improved- update a1c today. Continue without meds for now  Recommended follow up: Return in about 3 months (around 10/21/2022) for followup or sooner if needed.Schedule b4 you leave. Future Appointments  Date Time Provider [place] 08/14/2022 10:30 AM [facility] 08/18/2022 10:40 AM SBelva Crome MD [facility]  09/01/2022 10:20 AM GPhilemon Kingdom MD [facility] None  09/04/2022  9:30 AM WRondel Jumbo PA-C [facility] None  10/16/2022  1:45 PM [facility] HE[facility] COACH [facility]   Lab/Order associations:   ICD-10-CM    1. Coronary artery disease involving coronary bypass graft of native heart without angina pectoris  I25.810     2. Hyperlipidemia, unspecified hyperlipidemia type  E78.5 CBC with Differential/Platelet    Comprehensive metabolic panel    Lipid panel    3. B12 deficiency  E53.8 Vitamin B12    4. Hyperglycemia  R73.9 HgB A1c    5. Hyperthyroidism  E05.90 TSH    T4, free     Meds ordered this encounter  Medications   FLUoxetine (PROZAC) 10 MG capsule    Sig: Take 1 capsule (10 mg total) by mouth daily.    Dispense:  30 capsule    Refill:  11   Return precautions advised.  SGarret Reddish MD

## 2022-07-25 ENCOUNTER — Telehealth: Payer: Self-pay | Admitting: Family Medicine

## 2022-07-25 NOTE — Telephone Encounter (Signed)
Patient's wife [name] returned call. Requests to be called at Ph# [phone].

## 2022-07-25 NOTE — Telephone Encounter (Signed)
Returned Timothy Gallagher's call and labs reviewed.

## 2022-08-07 DIAGNOSIS — Z8669 Personal history of other diseases of the nervous system and sense organs: Secondary | ICD-10-CM | POA: Diagnosis not present

## 2022-08-07 DIAGNOSIS — H35372 Puckering of macula, left eye: Secondary | ICD-10-CM | POA: Diagnosis not present

## 2022-08-07 DIAGNOSIS — H353131 Nonexudative age-related macular degeneration, bilateral, early dry stage: Secondary | ICD-10-CM | POA: Diagnosis not present

## 2022-08-13 NOTE — Progress Notes (Unsigned)
08/14/2022 Timothy Gallagher [id number] 1936-10-15  Referring provider: [name], MD Primary GI doctor: Dr. Carlean Gallagher  ASSESSMENT AND PLAN:   Assessment and Plan 86 y.o. male here for assessment of the following: 1. Dysphagia, unspecified type   2. Abdominal pain, left lower quadrant   3. Chronic idiopathic constipation   4. Hypothyroidism, unspecified type   5. Loss of weight   6. Dysuria    Dysphagia  has improved with omeprazole 20 mg daily, barium swallow without any strictures, did show dysmotility.  Increase omeprazole to 40 mg daily, if continues to have issues with swallowing we will suggest modified barium swallow with speech pathology.  Lower abdominal discomfort,  recent CT abdomen pelvis August showing constipation, no other issues. Add on MiraLAX, fiber supplement.  Hypothyroidism, recently slightly in hypothyroidism range could be contributing to some of symptoms, suggest taking omeprazole at night did not decrease absorption of thyroid medication.  Has follow-up with endocrinology.  Loss of weight Patient has history of dementia, has some weight loss over last 3 months about 20 pounds, wife states has decreased appetite. Normal CT abdomen and pelvis. May want to consider chest x-ray Question needs medication such as Remeron. Add on Ensure/boost.  Dysuria We will check urine and send labs to Timothy Gallagher per patient request.   Orders Placed This Encounter  Procedures   Urinalysis, Routine w reflex microscopic    Meds ordered this encounter  Medications   omeprazole (PRILOSEC) 20 MG capsule    Sig: Take 2 capsules (40 mg total) by mouth daily.    Dispense:  30 capsule    Refill:  0    History of Present Illness:  86 y.o. male  with a past medical history of dementia, coronary artery disease status post bypass 2015, esophageal stricture/oropharyngeal dysphagia with dementia, gastritis and others listed below, returns to clinic today for  evaluation of AB pain and dysphagia.[name] Kitchen  06/17/2022 office visit with Timothy Gallagher for dysphagia, epigastric discomfort.  Started on omeprazole 20 mg.   Had hypotension at that visit discussed following up with PCP. 06/24/2022 barium swallow showed no lesions or strictures, significant esophageal dysmotility.  If continuing issues send to [facility] with speech pathology. 07/03/2022 CT abdomen pelvis with contrast for acute abdominal pain showed nonobstructing bilateral nephrolithiasis, diverticulosis with no acute diverticulitis, stable suprarenal aortic aneurysm 3.1 cm and interval increase in infrarenal aortic aneurysm 3.7 cm, prostamegaly.  This also did show stool throughout the majority of the colon.  07/22/2022 office visit with PCP for nausea in the morning, weight loss.  Stop sertraline which is new medication. Wife is here, has been married 35 years.   Normally has lower or periumbilical AB pain, RLQ pain, comes and goes.  Can be minutes to hours for her AB pain.  Wife states he has nausea every morning, zofran helps.  States swallowing has improved some, but the night before last, still had some issues with it going down. He is on the omeprazole once a day 20 mg.  He has had weight loss of 12 lbs since May.  Had constipation on Ct scan, states has BM daily but due to dementia he is poor historian.  Per wife, has some burning with urination.   Had recent normal labs with PCP 07/22/2022  Wt Readings from Last 8 Encounters:  08/14/22 178 lb 6 oz (80.9 kg)  07/22/22 178 lb 3.2 oz (80.8 kg)  06/19/22 181 lb 9.6 oz (82.4 kg)  06/17/22 182 lb  6 oz (82.7 kg)  05/22/22 184 lb 9.6 oz (83.7 kg)  04/21/22 184 lb 9.6 oz (83.7 kg)  03/17/22 191 lb (86.6 kg)  03/05/22 189 lb (85.7 kg)     He  reports that he quit smoking about 23 years ago. His smoking use included cigarettes. He has a 21.00 pack-year smoking history. He has never used smokeless tobacco. He reports that he does not drink alcohol and  does not use drugs. His family history includes COPD in his father; Early death in his daughter; Heart attack in his brother and father; Heart disease in his father; Hernia in his mother.   Current Medications:   Current Outpatient Medications (Endocrine & Metabolic):    methimazole (TAPAZOLE) 5 MG tablet, Take 1 tablet (5 mg total) by mouth daily.  Current Outpatient Medications (Cardiovascular):    rosuvastatin (CRESTOR) 40 MG tablet, Take 1 tablet (40 mg total) by mouth daily. Dose increase 01/13/22 due to heart disease and LDL goal under 70  Current Outpatient Medications (Respiratory):    fluticasone (FLONASE) 50 MCG/ACT nasal spray, SHAKE LQ AND U 1 TO 2 SPRAYS IEN D PRF ALLERGIES  Current Outpatient Medications (Analgesics):    aspirin 81 MG EC tablet, Take 1 tablet (81 mg total) by mouth daily.   Current Outpatient Medications (Other):    donepezil (ARICEPT) 10 MG tablet, Take 1 tablet (10 mg total) by mouth daily.   FLUoxetine (PROZAC) 10 MG capsule, Take 1 capsule (10 mg total) by mouth daily.   Multiple Vitamins-Minerals (CENTRUM SILVER PO), Take 1 tablet by mouth daily.   tamsulosin (FLOMAX) 0.4 MG CAPS capsule, Take 0.4 mg by mouth. Prescribed by urology   omeprazole (PRILOSEC) 20 MG capsule, Take 2 capsules (40 mg total) by mouth daily.   ondansetron (ZOFRAN-ODT) 4 MG disintegrating tablet, Take 1 tablet (4 mg total) by mouth every 8 (eight) hours as needed for nausea or vomiting. (Patient not taking: Reported on 08/14/2022)  Surgical History:  He  has a past surgical history that includes Hernia repair; lamenectomy; Lumbar fusion; Tonsillectomy; Incision and drainage wound with foreign body removal (Left, 12/20/2013); Esophagogastroduodenoscopy; Colonoscopy; Flexible sigmoidoscopy; Varicose vein surgery (Left); Cardiac catheterization; Finger surgery; Coronary artery bypass graft (N/A, 05/19/2014); Intraoprative transesophageal echocardiogram (N/A, 05/19/2014); left heart  catheterization with coronary angiogram (N/A, 05/12/2014); and Hemorrhoid banding.  Current Medications, Allergies, Past Medical History, Past Surgical History, Family History and Social History were reviewed in [employer] record.  Physical Exam: BP 112/64   Pulse 73   Ht '5\' 11"'$  (1.803 m)   Wt 178 lb 6 oz (80.9 kg)   BMI 24.88 kg/m  General:   Pleasant, well developed male in no acute distress Heart : Regular rate and rhythm; no murmurs Pulm: Clear anteriorly; no wheezing Abdomen:  Soft, Flat AB, Sluggish bowel sounds. mild tenderness in the RLQ. Without guarding and Without rebound, No organomegaly appreciated. Rectal: Not evaluated Extremities:  without  edema. Neurologic:  Alert and  oriented x4;  No focal deficits. Poor short term memory Psych:  Cooperative. Normal mood and affect.   Vladimir Crofts, Timothy-C 08/14/22

## 2022-08-14 ENCOUNTER — Encounter: Payer: Self-pay | Admitting: Physician Assistant

## 2022-08-14 ENCOUNTER — Ambulatory Visit: Payer: Medicare Other | Admitting: Physician Assistant

## 2022-08-14 ENCOUNTER — Ambulatory Visit (INDEPENDENT_AMBULATORY_CARE_PROVIDER_SITE_OTHER): Payer: Medicare Other | Admitting: Family

## 2022-08-14 ENCOUNTER — Other Ambulatory Visit (INDEPENDENT_AMBULATORY_CARE_PROVIDER_SITE_OTHER): Payer: Medicare Other

## 2022-08-14 ENCOUNTER — Telehealth: Payer: Self-pay

## 2022-08-14 ENCOUNTER — Other Ambulatory Visit: Payer: Self-pay

## 2022-08-14 ENCOUNTER — Encounter: Payer: Self-pay | Admitting: Family

## 2022-08-14 VITALS — BP 124/73 | HR 71 | Temp 98.2°F | Ht 71.0 in | Wt 180.0 lb

## 2022-08-14 VITALS — BP 112/64 | HR 73 | Ht 71.0 in | Wt 178.4 lb

## 2022-08-14 DIAGNOSIS — R8290 Unspecified abnormal findings in urine: Secondary | ICD-10-CM

## 2022-08-14 DIAGNOSIS — R1032 Left lower quadrant pain: Secondary | ICD-10-CM | POA: Diagnosis not present

## 2022-08-14 DIAGNOSIS — E039 Hypothyroidism, unspecified: Secondary | ICD-10-CM

## 2022-08-14 DIAGNOSIS — R1310 Dysphagia, unspecified: Secondary | ICD-10-CM | POA: Diagnosis not present

## 2022-08-14 DIAGNOSIS — R300 Dysuria: Secondary | ICD-10-CM

## 2022-08-14 DIAGNOSIS — N3000 Acute cystitis without hematuria: Secondary | ICD-10-CM

## 2022-08-14 DIAGNOSIS — K5904 Chronic idiopathic constipation: Secondary | ICD-10-CM | POA: Diagnosis not present

## 2022-08-14 DIAGNOSIS — R634 Abnormal weight loss: Secondary | ICD-10-CM

## 2022-08-14 LAB — URINALYSIS, ROUTINE W REFLEX MICROSCOPIC
Ketones, ur: NEGATIVE
Nitrite: NEGATIVE
Specific Gravity, Urine: 1.025 (ref 1.000–1.030)
Total Protein, Urine: 30 — AB
Urine Glucose: NEGATIVE
Urobilinogen, UA: 1 (ref 0.0–1.0)
pH: 6 (ref 5.0–8.0)

## 2022-08-14 MED ORDER — OMEPRAZOLE 20 MG PO CPDR: 40.0000 mg | DELAYED_RELEASE_CAPSULE | Freq: Every day | ORAL | 0 refills | Status: DC

## 2022-08-14 MED ORDER — CEPHALEXIN 500 MG PO CAPS: 500.0000 mg | ORAL_CAPSULE | Freq: Four times a day (QID) | ORAL | 0 refills | Status: DC

## 2022-08-14 NOTE — Patient Instructions (Addendum)
Your provider has requested that you go to the basement level for lab work before leaving today. Press "B" on the elevator. The lab is located at the first door on the left as you exit the elevator.    Behavioral and Dietary Strategies for Management of Esophageal Dysmotility/dysphagia 1. Take reflux medications 30+ minutes before food at dinner INCREASE THE OMEPRAZOLE TO 40 MG A DAY for at least a month 2. Begin meals with warm beverage 3. Eat smaller more frequent meals 4. Eat slowly, taking small bites and sips 5. Alternate solids and liquids 6. Avoid foods/liquids that increase acid production 7. Sit upright during and for 30+ minutes after meals to facilitate esophageal clearing 8. Can try altoid melting in mouth before food  If this is not improving your swallowing, can refer to speech therapy.   Miralax is an osmotic laxative.  It only brings more water into the stool.  This is safe to take daily.  Can take up to 17 gram of miralax twice a day.  Mix with juice or coffee.  Start 1 capful at night for 3-4 days and reassess your response in 3-4 days.  You can increase and decrease the dose based on your response.  Remember, it can take up to 3-4 days to take effect OR for the effects to wear off.   I often pair this with benefiber in the morning to help assure the stool is not too loose.   Add on ensure/boost Talk with primary care about urinary burning and about possible Chest xray  Toileting tips to help with your constipation - Drink at least 64-80 ounces of water/liquid per day. - Establish a time to try to move your bowels every day.  For many people, this is after a cup of coffee or after a meal such as breakfast. - Sit all of the way back on the toilet keeping your back fairly straight and while sitting up, try to rest the tops of your forearms on your upper thighs.   - Raising your feet with a step stool/squatty potty can be helpful to improve the angle that allows your  stool to pass through the rectum. - Relax the rectum feeling it bulge toward the toilet water.  If you feel your rectum raising toward your body, you are contracting rather than relaxing. - Breathe in and slowly exhale. "Belly breath" by expanding your belly towards your belly button. Keep belly expanded as you gently direct pressure down and back to the anus.  A low pitched GRRR sound can assist with increasing intra-abdominal pressure.  - Repeat 3-4 times. If unsuccessful, contract the pelvic floor to restore normal tone and get off the toilet.  Avoid excessive straining. - To reduce excessive wiping by teaching your anus to normally contract, place hands on outer aspect of knees and resist knee movement outward.  Hold 5-10 second then place hands just inside of knees and resist inwar  Thank you for entrusting me with your care and for choosing [facility], Vicie Mutters, P.A.-C

## 2022-08-14 NOTE — Telephone Encounter (Signed)
Lab add on sheet faxed for urine culture. Patient's wife to call and have patient follow up with PCP.    [name], MD  Carl Best, RN  I did not see urine culture processing on him- if this was not completed and cannot be added on- can you advise him to follow up with [place] for a visit- we will collect urine culture at that point   Thanks  Annie Main- his PCP

## 2022-08-14 NOTE — Progress Notes (Signed)
Patient ID: Timothy Gallagher, male    DOB: [birth year], 86 y.o.   MRN: [id number]  Chief Complaint  Patient presents with   Dysuria    Pt c/o burning during urination for about a week. Pt was seen at GI yesterday and his urine is showing abnormalities. Would like to be prescribed antibiotics.     HPI:      UTI:  seen by GI office this am and urine checked, found positive for UTI. Pt reports having some dysuria, no hematuria or back/flank pain, no cloudiness or foul odor. Pt with wife who states he does not drink enough water, last UTI was more than a year ago.  Assessment & Plan:  1. Acute cystitis without hematuria allergies to PCN & Cipro, sending Keflex, advised pt & wife on use & SE, drink at least 2L of water qd. Will notify re culture results.  - cephALEXin (KEFLEX) 500 MG capsule; Take 1 capsule (500 mg total) by mouth 4 (four) times daily for 7 days.  Dispense: 28 capsule; Refill: 0'   Subjective:    Outpatient Medications Prior to Visit  Medication Sig Dispense Refill   aspirin 81 MG EC tablet Take 1 tablet (81 mg total) by mouth daily.     donepezil (ARICEPT) 10 MG tablet Take 1 tablet (10 mg total) by mouth daily. 90 tablet 3   FLUoxetine (PROZAC) 10 MG capsule Take 1 capsule (10 mg total) by mouth daily. 30 capsule 11   fluticasone (FLONASE) 50 MCG/ACT nasal spray SHAKE LQ AND U 1 TO 2 SPRAYS IEN D PRF ALLERGIES     methimazole (TAPAZOLE) 5 MG tablet Take 1 tablet (5 mg total) by mouth daily. 90 tablet 3   Multiple Vitamins-Minerals (CENTRUM SILVER PO) Take 1 tablet by mouth daily.     omeprazole (PRILOSEC) 20 MG capsule Take 2 capsules (40 mg total) by mouth daily. 30 capsule 0   ondansetron (ZOFRAN-ODT) 4 MG disintegrating tablet Take 1 tablet (4 mg total) by mouth every 8 (eight) hours as needed for nausea or vomiting. 20 tablet 0   rosuvastatin (CRESTOR) 40 MG tablet Take 1 tablet (40 mg total) by mouth daily. Dose increase 01/13/22 due to heart disease and LDL goal under  70 90 tablet 3   tamsulosin (FLOMAX) 0.4 MG CAPS capsule Take 0.4 mg by mouth. Prescribed by urology     No facility-administered medications prior to visit.   Past Medical History:  Diagnosis Date   ACNE ROSACEA 06/27/2009   ACTINIC KERATOSIS, HEAD 04/18/2009   Acute maxillary sinusitis 05/14/2010   ALLERGIC RHINITIS 04/09/2007   Anal fissure 04/07/2016   B12 DEFICIENCY 06/07/2007   BACK PAIN WITH RADICULOPATHY 04/24/2008   Bilateral nephrolithiasis 07/07/2022   CT abd 06/2022 1. Nonobstructive bilateral nephrolithiasis measuring up to 5 mm on the right and 6 mm on the left.   Cancer [facility])    skin   CHEST WALL PAIN, ACUTE 06/15/2009   Chronic maxillary sinusitis 05/29/2008   COLITIS 04/27/2009   COLONIC POLYPS, HX OF 04/27/2009   tubular adenomas   Coronary artery disease 05/12/2014   Cath 05/12/2014 w/ severe 3-vessel CAD and preserved LV function, EF 55%   DERMATITIS, ATOPIC 10/12/2007   Diverticular disease 07/07/2022   CT abd 06/2022 . Colonic diverticulosis with no acute diverticulitis.   DIVERTICULOSIS, COLON 04/27/2009   ECCHYMOSES, SPONTANEOUS 06/27/2008   Elevated sedimentation rate 05/02/2009   ESOPHAGEAL STRICTURE 04/27/2009   GASTRITIS, CHRONIC 04/27/2009   HYPERLIPIDEMIA 03/13/2008  HYPERTENSION 04/09/2007   Hyperthyroidism    Iliac aneurysm ([place]) 07/07/2022   CT abd 06/2022 . Aneurysmal left common iliac artery (1.6 cm).     Internal bleeding hemorrhoids 01/22/2015   01/22/2015 Seen at anoscopy, grade 1 all 3 positions    Irritable bowel syndrome 08/11/2007   KIDNEY DISEASE 04/09/2007   NECK PAIN 09/14/2008   NEUROPATHY, IDIOPATHIC PERIPHERAL NEC 08/11/2007   OSTEOARTHRITIS, WRIST, RIGHT 08/05/2010   Postoperative delirium 05/20/2014   S/P CABG x 4 05/19/2014   LIMA to LAD, SVG to diag, SVG to OM, SVG to PDA, EVH via right thigh and leg   Past Surgical History:  Procedure Laterality Date   CARDIAC CATHETERIZATION     COLONOSCOPY     CORONARY ARTERY BYPASS  GRAFT N/A 05/19/2014   Procedure: CORONARY ARTERY BYPASS GRAFTING (CABG);  Surgeon: Rexene Alberts, MD;  Location: [place];  Service: Open Heart Surgery;  Laterality: N/A;  Times 4 using left internal mammary artery and endoscopically harvested right saphenous vein   ESOPHAGOGASTRODUODENOSCOPY     FINGER SURGERY     cut off end of finger   FLEXIBLE SIGMOIDOSCOPY     HEMORRHOID BANDING     HERNIA REPAIR     INCISION AND DRAINAGE WOUND WITH FOREIGN BODY REMOVAL Left 12/20/2013   Procedure: INCISION AND DRAINAGE LEFT INDEX FINGER;  Surgeon: Tennis Must, MD;  Location: [facility];  Service: Orthopedics;  Laterality: Left;   INTRAOPERATIVE TRANSESOPHAGEAL ECHOCARDIOGRAM N/A 05/19/2014   Procedure: INTRAOPERATIVE TRANSESOPHAGEAL ECHOCARDIOGRAM;  Surgeon: Rexene Alberts, MD;  Location: [place];  Service: Open Heart Surgery;  Laterality: N/A;   lamenectomy     LEFT HEART CATHETERIZATION WITH CORONARY ANGIOGRAM N/A 05/12/2014   Procedure: LEFT HEART CATHETERIZATION WITH CORONARY ANGIOGRAM;  Surgeon: Sinclair Grooms, MD;  Location: [facility] CATH LAB;  Service: Cardiovascular;  Laterality: N/A;   LUMBAR FUSION     TONSILLECTOMY     VARICOSE VEIN SURGERY Left    Allergies  Allergen Reactions   Lipitor [Atorvastatin] Other (See Comments)    REACTION: nausea and blurred vision   Trazodone And Nefazodone Other (See Comments)    dizzy   Ciprofloxacin Swelling   Mycophenolate Mofetil Other (See Comments)    REACTION: unspecified   Amoxicillin Rash    Has patient had a PCN reaction causing immediate rash, facial/tongue/throat swelling, SOB or lightheadedness with hypotension: Unknown Has patient had a PCN reaction causing severe rash involving mucus membranes or skin necrosis: Unknown Has patient had a PCN reaction that required hospitalization: Unknown Has patient had a PCN reaction occurring within the last 10 years: Unknown If all of the above answers are "NO", then may proceed with Cephalosporin use.     Penicillins Rash    Has patient had a PCN reaction causing immediate rash, facial/tongue/throat swelling, SOB or lightheadedness with hypotension: Unknown Has patient had a PCN reaction causing severe rash involving mucus membranes or skin necrosis: Unknown Has patient had a PCN reaction that required hospitalization: Unknown Has patient had a PCN reaction occurring within the last 10 years: Unknown If all of the above answers are "NO", then may proceed with Cephalosporin use.    Rosuvastatin Other (See Comments)    Unknown       Objective:    Physical Exam Vitals and nursing note reviewed.  Constitutional:      General: He is not in acute distress.    Appearance: Normal appearance.  HENT:     Head: Normocephalic.  Cardiovascular:     Rate and Rhythm: Normal rate and regular rhythm.  Pulmonary:     Effort: Pulmonary effort is normal.     Breath sounds: Normal breath sounds.  Musculoskeletal:        General: Normal range of motion.     Cervical back: Normal range of motion.  Skin:    General: Skin is warm and dry.  Neurological:     Mental Status: He is alert and oriented to person, place, and time.  Psychiatric:        Mood and Affect: Mood normal.    BP 124/73 (BP Location: Left Arm, Patient Position: Sitting, Cuff Size: Large)   Pulse 71   Temp 98.2 F (36.8 C) (Temporal)   Ht '5\' 11"'$  (1.803 m)   Wt 180 lb (81.6 kg)   SpO2 97%   BMI 25.10 kg/m  Wt Readings from Last 3 Encounters:  08/14/22 180 lb (81.6 kg)  08/14/22 178 lb 6 oz (80.9 kg)  07/22/22 178 lb 3.2 oz (80.8 kg)       Jeanie Sewer, NP

## 2022-08-15 ENCOUNTER — Ambulatory Visit: Payer: Medicare Other | Admitting: Family Medicine

## 2022-08-17 LAB — URINE CULTURE
MICRO NUMBER:: 13981438
SPECIMEN QUALITY:: ADEQUATE

## 2022-08-17 NOTE — Progress Notes (Unsigned)
Cardiology Office Note:    Date:  08/18/2022   ID:  Timothy Gallagher, DOB 1936-04-26, MRN [id number]  PCP:  [name], MD  Cardiologist:  Sinclair Grooms, MD   Referring MD: [name], MD   Chief Complaint  Patient presents with   Hyperlipidemia   Hypertension   Coronary Artery Disease    History of Present Illness:    Timothy Gallagher is a 86 y.o. male with a hx of coronary artery disease, history of CABG (LIMA-LAD, SVG-PDA, SVG-OM, SVG-diagonal) by Dr. [name] 2015, hyperlipidemia, AAA (3.6 cm, 2022), hypertension, and history of kidney disease.   He is doing okay.  He denies chest pain.  His wife notes that he complains of dizziness.  These seem to be episodes associated with standing from sitting or upright position from squatting or bending over.  No dizziness with lying down.  Denies chest pain and shortness of breath.  Past Medical History:  Diagnosis Date   ACNE ROSACEA 06/27/2009   ACTINIC KERATOSIS, HEAD 04/18/2009   Acute maxillary sinusitis 05/14/2010   ALLERGIC RHINITIS 04/09/2007   Anal fissure 04/07/2016   B12 DEFICIENCY 06/07/2007   BACK PAIN WITH RADICULOPATHY 04/24/2008   Bilateral nephrolithiasis 07/07/2022   CT abd 06/2022 1. Nonobstructive bilateral nephrolithiasis measuring up to 5 mm on the right and 6 mm on the left.   Cancer [facility])    skin   CHEST WALL PAIN, ACUTE 06/15/2009   Chronic maxillary sinusitis 05/29/2008   COLITIS 04/27/2009   COLONIC POLYPS, HX OF 04/27/2009   tubular adenomas   Coronary artery disease 05/12/2014   Cath 05/12/2014 w/ severe 3-vessel CAD and preserved LV function, EF 55%   DERMATITIS, ATOPIC 10/12/2007   Diverticular disease 07/07/2022   CT abd 06/2022 . Colonic diverticulosis with no acute diverticulitis.   DIVERTICULOSIS, COLON 04/27/2009   ECCHYMOSES, SPONTANEOUS 06/27/2008   Elevated sedimentation rate 05/02/2009   ESOPHAGEAL STRICTURE 04/27/2009   GASTRITIS, CHRONIC 04/27/2009   HYPERLIPIDEMIA  03/13/2008   HYPERTENSION 04/09/2007   Hyperthyroidism    Iliac aneurysm ([place]) 07/07/2022   CT abd 06/2022 . Aneurysmal left common iliac artery (1.6 cm).     Internal bleeding hemorrhoids 01/22/2015   01/22/2015 Seen at anoscopy, grade 1 all 3 positions    Irritable bowel syndrome 08/11/2007   KIDNEY DISEASE 04/09/2007   NECK PAIN 09/14/2008   NEUROPATHY, IDIOPATHIC PERIPHERAL NEC 08/11/2007   OSTEOARTHRITIS, WRIST, RIGHT 08/05/2010   Postoperative delirium 05/20/2014   S/P CABG x 4 05/19/2014   LIMA to LAD, SVG to diag, SVG to OM, SVG to PDA, EVH via right thigh and leg    Past Surgical History:  Procedure Laterality Date   CARDIAC CATHETERIZATION     COLONOSCOPY     CORONARY ARTERY BYPASS GRAFT N/A 05/19/2014   Procedure: CORONARY ARTERY BYPASS GRAFTING (CABG);  Surgeon: Rexene Alberts, MD;  Location: [place];  Service: Open Heart Surgery;  Laterality: N/A;  Times 4 using left internal mammary artery and endoscopically harvested right saphenous vein   ESOPHAGOGASTRODUODENOSCOPY     FINGER SURGERY     cut off end of finger   FLEXIBLE SIGMOIDOSCOPY     HEMORRHOID BANDING     HERNIA REPAIR     INCISION AND DRAINAGE WOUND WITH FOREIGN BODY REMOVAL Left 12/20/2013   Procedure: INCISION AND DRAINAGE LEFT INDEX FINGER;  Surgeon: Tennis Must, MD;  [facility];  Service: Orthopedics;  Laterality: Left;   INTRAOPERATIVE TRANSESOPHAGEAL ECHOCARDIOGRAM  N/A 05/19/2014   Procedure: INTRAOPERATIVE TRANSESOPHAGEAL ECHOCARDIOGRAM;  Surgeon: Rexene Alberts, MD;  Location: [place];  Service: Open Heart Surgery;  Laterality: N/A;   lamenectomy     LEFT HEART CATHETERIZATION WITH CORONARY ANGIOGRAM N/A 05/12/2014   Procedure: LEFT HEART CATHETERIZATION WITH CORONARY ANGIOGRAM;  Surgeon: Sinclair Grooms, MD;  Location: [facility] CATH LAB;  Service: Cardiovascular;  Laterality: N/A;   LUMBAR FUSION     TONSILLECTOMY     VARICOSE VEIN SURGERY Left     Current Medications: Current Meds  Medication Sig    aspirin 81 MG EC tablet Take 1 tablet (81 mg total) by mouth daily.   cephALEXin (KEFLEX) 500 MG capsule Take 1 capsule (500 mg total) by mouth 4 (four) times daily for 7 days.   donepezil (ARICEPT) 10 MG tablet Take 1 tablet (10 mg total) by mouth daily.   FLUoxetine (PROZAC) 10 MG capsule Take 1 capsule (10 mg total) by mouth daily.   fluticasone (FLONASE) 50 MCG/ACT nasal spray SHAKE LQ AND U 1 TO 2 SPRAYS IEN D PRF ALLERGIES   methimazole (TAPAZOLE) 5 MG tablet Take 1 tablet (5 mg total) by mouth daily.   Multiple Vitamins-Minerals (CENTRUM SILVER PO) Take 1 tablet by mouth daily.   omeprazole (PRILOSEC) 20 MG capsule Take 2 capsules (40 mg total) by mouth daily.   rosuvastatin (CRESTOR) 40 MG tablet Take 1 tablet (40 mg total) by mouth daily. Dose increase 01/13/22 due to heart disease and LDL goal under 70   tamsulosin (FLOMAX) 0.4 MG CAPS capsule Take 0.4 mg by mouth. Prescribed by urology     Allergies:   Lipitor [atorvastatin], Trazodone and nefazodone, Ciprofloxacin, Mycophenolate mofetil, Amoxicillin, Penicillins, and Rosuvastatin   Social History   Socioeconomic History   Marital status: Married    Spouse name: Not on file   Number of children: 3   Years of education: 11   Highest education level: Not on file  Occupational History   Occupation: retired    Fish farm manager: RETIRED    Comment: from [place] Use   Smoking status: Former    Packs/day: 0.50    Years: 42.00    Total pack years: 21.00    Types: Cigarettes    Quit date: 11/17/1998    Years since quitting: 23.7   Smokeless tobacco: Never  Vaping Use   Vaping Use: Never used  Substance and Sexual Activity   Alcohol use: No   Drug use: No   Sexual activity: Yes  Other Topics Concern   Not on file  Social History Narrative   Married 35 years (2nd marriage). 2 kids from previous marriage (lost a 60rd child hit by car at age 32). 2 stepchildren. 6 grandkids. 2 greatgrandkids.       Retired from [employer]: walking/exercise, building in shop-furniture (cut finger off in February doing this)   6 grand children - Has bought them all a new car    Brother died of probably addiction   Sister also died; not sure of cause    He enjoys his life; Likes to play the keyboard; guitar    Had played in a gospel quartet    Enjoys working with wood    Right handed   Drinks caffeine   Social Determinants of Health   Financial Resource Strain: Low Risk  (10/03/2021)   Overall Financial Resource Strain (CARDIA)    Difficulty of Paying Living Expenses: Not  hard at all  Food Insecurity: No Food Insecurity (10/03/2021)   Hunger Vital Sign    Worried About Running Out of Food in the Last Year: Never true    Ran Out of Food in the Last Year: Never true  Transportation Needs: No Transportation Needs (10/03/2021)   PRAPARE - Hydrologist (Medical): No    Lack of Transportation (Non-Medical): No  Physical Activity: Inactive (10/03/2021)   Exercise Vital Sign    Days of Exercise per Week: 0 days    Minutes of Exercise per Session: 0 min  Stress: No Stress Concern Present (10/03/2021)   [place]    Feeling of Stress : Not at all  Social Connections: Moderately Isolated (10/03/2021)   Social Connection and Isolation Panel [NHANES]    Frequency of Communication with Friends and Family: More than three times a week    Frequency of Social Gatherings with Friends and Family: More than three times a week    Attends Religious Services: Never    Marine scientist or Organizations: No    Attends Music therapist: Never    Marital Status: Married     Family History: The patient's family history includes COPD in his father; Early death in his daughter; Heart attack in his brother and father; Heart disease in his father; Hernia in his mother. There is no history of Colon  cancer, Esophageal cancer, or Pancreatic cancer.  ROS:   Please see the history of present illness.    Decreased memory.  All other systems reviewed and are negative.  EKGs/Labs/Other Studies Reviewed:    The following studies were reviewed today: VAS [place] AAA DUPLEX 01/23/2021: Summary:  Abdominal Aorta: Aortoiliac atherosclerosis. There is evidence of abnormal  dilatation of the mid and distal Abdominal aorta. The largest aortic  measurement is 3.6 cm. The largest aortic diameter remains essentially  unchanged compared to prior exam.  Previous diameter measurement was 3.4 cm obtained on 01/24/2020.   Stenosis:  Widely patent bilateral common and external iliac arteries without  evidence of stenosis.   IVC/Iliac: Patent IVC.     *See table(s) above for measurements and observations.  Suggest follow up study in 12 months.   Bilateral Carotid Doppler 04/2022: Summary:  Right Carotid: Velocities in the right ICA are consistent with a 1-39%  stenosis.   Left Carotid: Velocities in the left ICA are consistent with a 1-39%  stenosis.   Vertebrals: Bilateral vertebral arteries demonstrate antegrade flow.   EKG:  EKG normal sinus rhythm, poor R wave progression V1 and V2.  Nonspecific T wave flattening.  Recent Labs: 07/22/2022: ALT 16; BUN 13; Creatinine, Ser 1.03; Hemoglobin 13.3; Platelets 179.0; Potassium 5.3; Sodium 141; TSH 5.60  Recent Lipid Panel    Component Value Date/Time   CHOL 107 07/22/2022 1121   CHOL 120 02/03/2018 0857   TRIG 85.0 07/22/2022 1121   HDL 41.90 07/22/2022 1121   HDL 51 02/03/2018 0857   CHOLHDL 3 07/22/2022 1121   VLDL 17.0 07/22/2022 1121   LDLCALC 48 07/22/2022 1121   LDLCALC 57 02/03/2018 0857   LDLDIRECT 75.0 11/27/2021 0930    Physical Exam:    VS:  BP 104/70   Pulse 69   Ht '5\' 11"'$  (1.803 m)   Wt 179 lb 6.4 oz (81.4 kg)   SpO2 97%   BMI 25.02 kg/m     Wt Readings from Last 3 Encounters:  08/18/22 179 lb 6.4 oz (81.4 kg)   08/14/22 180 lb (81.6 kg)  08/14/22 178 lb 6 oz (80.9 kg)     GEN: Slender. No acute distress HEENT: Normal NECK: No JVD. LYMPHATICS: No lymphadenopathy CARDIAC: No murmur. RRR no gallop, or edema. VASCULAR:  Normal Pulses. No bruits. RESPIRATORY:  Clear to auscultation without rales, wheezing or rhonchi  ABDOMEN: Soft, non-tender, non-distended, No pulsatile mass, MUSCULOSKELETAL: No deformity  SKIN: Warm and dry NEUROLOGIC:  Alert and oriented x 3 PSYCHIATRIC:  Normal affect   ASSESSMENT:    1. Coronary artery disease involving coronary bypass graft of native heart without angina pectoris   2. Essential hypertension   3. Hyperlipidemia, unspecified hyperlipidemia type   4. Infrarenal abdominal aortic aneurysm (AAA) without rupture ([place])   5. Dizziness    PLAN:    In order of problems listed above:  Secondary prevention reviewed Not on any antihypertensive therapy at this point. Continue Crestor We did not discuss May have orthostasis related to tamsulosin.  I advised him to make sure that he stands for 5 seconds before he starts walking.  Overall education and awareness concerning secondary risk prevention was discussed in detail: LDL less than 70, hemoglobin A1c less than 7, blood pressure target less than 130/80 mmHg, >150 minutes of moderate aerobic activity per week, avoidance of smoking, weight control (via diet and exercise), and continued surveillance/management of/for obstructive sleep apnea.    Medication Adjustments/Labs and Tests Ordered: Current medicines are reviewed at length with the patient today.  Concerns regarding medicines are outlined above.  Orders Placed This Encounter  Procedures   EKG 12-Lead   No orders of the defined types were placed in this encounter.   Patient Instructions  Medication Instructions:  Your physician recommends that you continue on your current medications as directed. Please refer to the Current Medication list given  to you today.  *If you need a refill on your cardiac medications before your next appointment, please call your pharmacy*  Lab Work: NONE  Testing/Procedures: NONE  Follow-Up: At [facility], you and your health needs are our priority.  As part of our continuing mission to provide you with exceptional heart care, we have created designated Provider Care Teams.  These Care Teams include your primary Cardiologist (physician) and Advanced Practice Providers (APPs -  Physician Assistants and Nurse Practitioners) who all work together to provide you with the care you need, when you need it.  Your next appointment:   1 year(s)  The format for your next appointment:   In Person  Provider:   Sinclair Grooms, MD   Important Information About Sugar         Signed, Sinclair Grooms, MD  08/18/2022 11:41 AM    [place]

## 2022-08-18 ENCOUNTER — Encounter: Payer: Self-pay | Admitting: Interventional Cardiology

## 2022-08-18 ENCOUNTER — Ambulatory Visit: Payer: Medicare Other | Attending: Interventional Cardiology | Admitting: Interventional Cardiology

## 2022-08-18 VITALS — BP 104/70 | HR 69 | Ht 71.0 in | Wt 179.4 lb

## 2022-08-18 DIAGNOSIS — I10 Essential (primary) hypertension: Secondary | ICD-10-CM

## 2022-08-18 DIAGNOSIS — E785 Hyperlipidemia, unspecified: Secondary | ICD-10-CM | POA: Diagnosis not present

## 2022-08-18 DIAGNOSIS — R42 Dizziness and giddiness: Secondary | ICD-10-CM

## 2022-08-18 DIAGNOSIS — I7143 Infrarenal abdominal aortic aneurysm, without rupture: Secondary | ICD-10-CM | POA: Diagnosis not present

## 2022-08-18 DIAGNOSIS — I25810 Atherosclerosis of coronary artery bypass graft(s) without angina pectoris: Secondary | ICD-10-CM

## 2022-08-18 DIAGNOSIS — I4510 Unspecified right bundle-branch block: Secondary | ICD-10-CM

## 2022-08-18 MED ORDER — NITROFURANTOIN MONOHYD MACRO 100 MG PO CAPS: 100.0000 mg | ORAL_CAPSULE | Freq: Two times a day (BID) | ORAL | 0 refills | Status: DC

## 2022-08-18 NOTE — Patient Instructions (Signed)
Medication Instructions:  Your physician recommends that you continue on your current medications as directed. Please refer to the Current Medication list given to you today.  *If you need a refill on your cardiac medications before your next appointment, please call your pharmacy*  Lab Work: NONE  Testing/Procedures: NONE  Follow-Up: At [facility], you and your health needs are our priority.  As part of our continuing mission to provide you with exceptional heart care, we have created designated Provider Care Teams.  These Care Teams include your primary Cardiologist (physician) and Advanced Practice Providers (APPs -  Physician Assistants and Nurse Practitioners) who all work together to provide you with the care you need, when you need it.  Your next appointment:   1 year(s)  The format for your next appointment:   In Person  Provider:   Henry W Smith III, MD    Important Information About Sugar       

## 2022-08-21 ENCOUNTER — Ambulatory Visit (INDEPENDENT_AMBULATORY_CARE_PROVIDER_SITE_OTHER): Payer: Medicare Other

## 2022-08-21 DIAGNOSIS — E538 Deficiency of other specified B group vitamins: Secondary | ICD-10-CM

## 2022-08-21 MED ORDER — CYANOCOBALAMIN 1000 MCG/ML IJ SOLN
1000.0000 ug | Freq: Once | INTRAMUSCULAR | Status: AC
  Administered 2022-08-21: 1000 ug via INTRAMUSCULAR

## 2022-08-21 NOTE — Progress Notes (Addendum)
Pt received B12 injection in lt deltoid. Pt tolerated injection well.

## 2022-08-29 ENCOUNTER — Telehealth: Payer: Self-pay | Admitting: Family Medicine

## 2022-08-29 NOTE — Telephone Encounter (Signed)
Caller states: - Pharmacy informed her macrobid 100 mg was sent in on 10/02 by PCP when she was picking up different meds - She was confused because Timothy Gallagher, who pt last saw for UTI prescribed keflex.  - Patient had finished keflex 500 mg as prescribed, 1 cap qid x 7 days  Caller requests: -A callback from PCP or PCP team to explain why medication was sent in

## 2022-08-29 NOTE — Telephone Encounter (Signed)
Called and spoke with [name] and referenced lab result from 09/28, questions answered.

## 2022-09-01 ENCOUNTER — Ambulatory Visit: Payer: Medicare Other | Admitting: Internal Medicine

## 2022-09-01 ENCOUNTER — Encounter: Payer: Self-pay | Admitting: Internal Medicine

## 2022-09-01 VITALS — BP 108/60 | HR 91 | Ht 71.0 in | Wt 178.4 lb

## 2022-09-01 DIAGNOSIS — E0590 Thyrotoxicosis, unspecified without thyrotoxic crisis or storm: Secondary | ICD-10-CM | POA: Diagnosis not present

## 2022-09-01 LAB — T3, FREE: T3, Free: 3.2 pg/mL (ref 2.3–4.2)

## 2022-09-01 LAB — TSH: TSH: 5.37 u[IU]/mL (ref 0.35–5.50)

## 2022-09-01 LAB — T4, FREE: Free T4: 0.7 ng/dL (ref 0.60–1.60)

## 2022-09-01 NOTE — Progress Notes (Unsigned)
Patient ID: Timothy Gallagher, male   DOB: [birth year], 86 y.o.   MRN: [id number]  HPI  Timothy Gallagher is a 86 y.o.-year-old maleyear-old male, returning for follow-up for thyrotoxicosis.  He previously saw Dr. Loanne Drilling, last 7 months ago.  He is here with his wife who offers part of the information especially related to past medical history, previous studies done and medication.  Reviewed history: -Patient was diagnosed with thyrotoxicosis in 2019. -He did not have imaging or thyroid antibody checks -He is currently on methimazole 5 mg daily  I reviewed pt's thyroid tests: Lab Results  Component Value Date   TSH 5.60 (H) 07/22/2022   TSH 3.86 02/11/2022   TSH 0.24 (L) 11/27/2021   TSH 0.02 (L) 10/01/2021   TSH 0.98 03/26/2021   TSH 4.62 (H) 10/01/2020   TSH 6.65 (H) 07/25/2020   TSH 1.719 12/31/2019   TSH 3.77 07/29/2019   TSH 3.92 04/28/2019   FREET4 0.76 07/22/2022   FREET4 0.72 02/11/2022   FREET4 0.84 11/27/2021   FREET4 1.43 10/01/2021   FREET4 0.93 03/26/2021   FREET4 0.84 10/01/2020   FREET4 0.68 07/25/2020   FREET4 0.79 07/29/2019   FREET4 0.84 04/28/2019   FREET4 0.68 01/26/2019   T3FREE 3.8 01/13/2018   Antithyroid antibodies: No results found for: "TSI"  Pt denies: - feeling nodules in neck - hoarseness - dysphagia >> improved after starting Omeprazole - choking - SOB with lying down  He had a Ba swallow (06/24/2022): 1. No esophageal mucosal lesions or strictures seen. 2. Significant esophageal dysmotility. 3. Esophagus does clear in the upright position with both water and dry swallowing, allowing for normal passage of a barium tablet.  He denies: - fatigue - excessive sweating/heat intolerance - tremors - anxiety - palpitations - weight loss  Pt has a FH of thyroid ds.: + son. No FH of thyroid cancer. No h/o radiation tx to head or neck. No recent contrast studies (last CT scanning with contrast was 2 months ago). No steroid use. No herbal supplements. No  Biotin use.  Pt. also has a history of CAD - s/p CABG x4, HTN, CKD, HL, B12 deficiency, nephrolithiasis. He saw ophthalmology at [facility] (Dr. Vallarie Mare) for the following: 1. Recurrent subtotal retinal detachment OS 2. Proliferative vitreoretinopathy OS 3. History of chronic retinal detachment OS 4. s/p retinal detachment repair with vitrectomy, membrane peel, laser and gas 06/17/17  ROS: Constitutional: + see HPI  Past Medical History:  Diagnosis Date   ACNE ROSACEA 06/27/2009   ACTINIC KERATOSIS, HEAD 04/18/2009   Acute maxillary sinusitis 05/14/2010   ALLERGIC RHINITIS 04/09/2007   Anal fissure 04/07/2016   B12 DEFICIENCY 06/07/2007   BACK PAIN WITH RADICULOPATHY 04/24/2008   Bilateral nephrolithiasis 07/07/2022   CT abd 06/2022 1. Nonobstructive bilateral nephrolithiasis measuring up to 5 mm on the right and 6 mm on the left.   Cancer [facility])    skin   CHEST WALL PAIN, ACUTE 06/15/2009   Chronic maxillary sinusitis 05/29/2008   COLITIS 04/27/2009   COLONIC POLYPS, HX OF 04/27/2009   tubular adenomas   Coronary artery disease 05/12/2014   Cath 05/12/2014 w/ severe 3-vessel CAD and preserved LV function, EF 55%   DERMATITIS, ATOPIC 10/12/2007   Diverticular disease 07/07/2022   CT abd 06/2022 . Colonic diverticulosis with no acute diverticulitis.   DIVERTICULOSIS, COLON 04/27/2009   ECCHYMOSES, SPONTANEOUS 06/27/2008   Elevated sedimentation rate 05/02/2009   ESOPHAGEAL STRICTURE 04/27/2009   GASTRITIS, CHRONIC 04/27/2009   HYPERLIPIDEMIA 03/13/2008  HYPERTENSION 04/09/2007   Hyperthyroidism    Iliac aneurysm ([place]) 07/07/2022   CT abd 06/2022 . Aneurysmal left common iliac artery (1.6 cm).     Internal bleeding hemorrhoids 01/22/2015   01/22/2015 Seen at anoscopy, grade 1 all 3 positions    Irritable bowel syndrome 08/11/2007   KIDNEY DISEASE 04/09/2007   NECK PAIN 09/14/2008   NEUROPATHY, IDIOPATHIC PERIPHERAL NEC 08/11/2007   OSTEOARTHRITIS, WRIST, RIGHT 08/05/2010   Postoperative  delirium 05/20/2014   S/P CABG x 4 05/19/2014   LIMA to LAD, SVG to diag, SVG to OM, SVG to PDA, EVH via right thigh and leg   Past Surgical History:  Procedure Laterality Date   CARDIAC CATHETERIZATION     COLONOSCOPY     CORONARY ARTERY BYPASS GRAFT N/A 05/19/2014   Procedure: CORONARY ARTERY BYPASS GRAFTING (CABG);  Surgeon: Rexene Alberts, MD;  Location: [place] [facility];  Service: Open Heart Surgery;  Laterality: N/A;  Times 4 using left internal mammary artery and endoscopically harvested right saphenous vein   ESOPHAGOGASTRODUODENOSCOPY     FINGER SURGERY     cut off end of finger   FLEXIBLE SIGMOIDOSCOPY     HEMORRHOID BANDING     HERNIA REPAIR     INCISION AND DRAINAGE WOUND WITH FOREIGN BODY REMOVAL Left 12/20/2013   Procedure: INCISION AND DRAINAGE LEFT INDEX FINGER;  Surgeon: Tennis Must, MD;  Location: [facility];  Service: Orthopedics;  Laterality: Left;   INTRAOPERATIVE TRANSESOPHAGEAL ECHOCARDIOGRAM N/A 05/19/2014   Procedure: INTRAOPERATIVE TRANSESOPHAGEAL ECHOCARDIOGRAM;  Surgeon: Rexene Alberts, MD;  Location: [place];  Service: Open Heart Surgery;  Laterality: N/A;   lamenectomy     LEFT HEART CATHETERIZATION WITH CORONARY ANGIOGRAM N/A 05/12/2014   Procedure: LEFT HEART CATHETERIZATION WITH CORONARY ANGIOGRAM;  Surgeon: Sinclair Grooms, MD;  Location: [facility] CATH LAB;  Service: Cardiovascular;  Laterality: N/A;   LUMBAR FUSION     TONSILLECTOMY     VARICOSE VEIN SURGERY Left    Social History   Socioeconomic History   Marital status: Married    Spouse name: Not on file   Number of children: 3   Years of education: 11   Highest education level: Not on file  Occupational History   Occupation: retired    Fish farm manager: RETIRED    Comment: from [place] Use   Smoking status: Former    Packs/day: 0.50    Years: 42.00    Total pack years: 21.00    Types: Cigarettes    Quit date: 11/17/1998    Years since quitting: 23.8   Smokeless tobacco: Never  Vaping Use   Vaping Use:  Never used  Substance and Sexual Activity   Alcohol use: No   Drug use: No   Sexual activity: Yes  Other Topics Concern   Not on file  Social History Narrative   Married 35 years (2nd marriage). 2 kids from previous marriage (lost a 54rd child hit by car at age 46). 2 stepchildren. 6 grandkids. 2 greatgrandkids.       Retired from [employer]: walking/exercise, building in shop-furniture (cut finger off in February doing this)   6 grand children - Has bought them all a new car    Brother died of probably addiction   Sister also died; not sure of cause    He enjoys his life; Likes to play the keyboard; guitar    Had played in a gospel quartet  Enjoys working with wood    Right handed   Drinks caffeine   Social Determinants of Health   Financial Resource Strain: Low Risk  (10/03/2021)   Overall Financial Resource Strain (CARDIA)    Difficulty of Paying Living Expenses: Not hard at all  Food Insecurity: No Food Insecurity (10/03/2021)   Hunger Vital Sign    Worried About Running Out of Food in the Last Year: Never true    Ran Out of Food in the Last Year: Never true  Transportation Needs: No Transportation Needs (10/03/2021)   PRAPARE - Hydrologist (Medical): No    Lack of Transportation (Non-Medical): No  Physical Activity: Inactive (10/03/2021)   Exercise Vital Sign    Days of Exercise per Week: 0 days    Minutes of Exercise per Session: 0 min  Stress: No Stress Concern Present (10/03/2021)   [place]    Feeling of Stress : Not at all  Social Connections: Moderately Isolated (10/03/2021)   Social Connection and Isolation Panel [NHANES]    Frequency of Communication with Friends and Family: More than three times a week    Frequency of Social Gatherings with Friends and Family: More than three times a week    Attends Religious Services: Never     Marine scientist or Organizations: No    Attends Archivist Meetings: Never    Marital Status: Married  Human resources officer Violence: Not At Risk (10/03/2021)   Humiliation, Afraid, Rape, and Kick questionnaire    Fear of Current or Ex-Partner: No    Emotionally Abused: No    Physically Abused: No    Sexually Abused: No   Current Outpatient Medications on File Prior to Visit  Medication Sig Dispense Refill   aspirin 81 MG EC tablet Take 1 tablet (81 mg total) by mouth daily.     donepezil (ARICEPT) 10 MG tablet Take 1 tablet (10 mg total) by mouth daily. 90 tablet 3   FLUoxetine (PROZAC) 10 MG capsule Take 1 capsule (10 mg total) by mouth daily. 30 capsule 11   fluticasone (FLONASE) 50 MCG/ACT nasal spray SHAKE LQ AND U 1 TO 2 SPRAYS IEN D PRF ALLERGIES     methimazole (TAPAZOLE) 5 MG tablet Take 1 tablet (5 mg total) by mouth daily. 90 tablet 3   Multiple Vitamins-Minerals (CENTRUM SILVER PO) Take 1 tablet by mouth daily.     nitrofurantoin, macrocrystal-monohydrate, (MACROBID) 100 MG capsule Take 1 capsule (100 mg total) by mouth 2 (two) times daily. 20 capsule 0   omeprazole (PRILOSEC) 20 MG capsule Take 2 capsules (40 mg total) by mouth daily. 30 capsule 0   rosuvastatin (CRESTOR) 40 MG tablet Take 1 tablet (40 mg total) by mouth daily. Dose increase 01/13/22 due to heart disease and LDL goal under 70 90 tablet 3   tamsulosin (FLOMAX) 0.4 MG CAPS capsule Take 0.4 mg by mouth. Prescribed by urology     No current facility-administered medications on file prior to visit.   Allergies  Allergen Reactions   Lipitor [Atorvastatin] Other (See Comments)    REACTION: nausea and blurred vision   Trazodone And Nefazodone Other (See Comments)    dizzy   Ciprofloxacin Swelling   Mycophenolate Mofetil Other (See Comments)    REACTION: unspecified   Amoxicillin Rash    Has patient had a PCN reaction causing immediate rash, facial/tongue/throat swelling, SOB or lightheadedness with  hypotension: Unknown Has  patient had a PCN reaction causing severe rash involving mucus membranes or skin necrosis: Unknown Has patient had a PCN reaction that required hospitalization: Unknown Has patient had a PCN reaction occurring within the last 10 years: Unknown If all of the above answers are "NO", then may proceed with Cephalosporin use.    Penicillins Rash    Has patient had a PCN reaction causing immediate rash, facial/tongue/throat swelling, SOB or lightheadedness with hypotension: Unknown Has patient had a PCN reaction causing severe rash involving mucus membranes or skin necrosis: Unknown Has patient had a PCN reaction that required hospitalization: Unknown Has patient had a PCN reaction occurring within the last 10 years: Unknown If all of the above answers are "NO", then may proceed with Cephalosporin use.    Rosuvastatin Other (See Comments)    Unknown    Family History  Problem Relation Age of Onset   Hernia Mother    Heart disease Father        smoker   COPD Father    Heart attack Father    Heart attack Brother    Early death Daughter    Colon cancer Neg Hx    Esophageal cancer Neg Hx    Pancreatic cancer Neg Hx    PE: BP 108/60 (BP Location: Right Arm, Patient Position: Sitting, Cuff Size: Normal)   Pulse 91   Ht '5\' 11"'$  (1.803 m)   Wt 178 lb 6.4 oz (80.9 kg)   SpO2 93%   BMI 24.88 kg/m  Wt Readings from Last 3 Encounters:  09/01/22 178 lb 6.4 oz (80.9 kg)  08/18/22 179 lb 6.4 oz (81.4 kg)  08/14/22 180 lb (81.6 kg)   Constitutional: normal weight, in NAD Eyes: no exophthalmos, no lid lag, no stare ENT: no thyromegaly, no thyroid bruits, no cervical lymphadenopathy Cardiovascular: RRR, No MRG Respiratory: CTA B Musculoskeletal: no deformities, strength intact in all 4 Skin: no rashes Neurological: + tremor with outstretched hands, DTR normal in all 4  ASSESSMENT: 1. Thyrotoxicosis - dx'ed 2019  PLAN:  1. Patient with a h/o low TSH, with  thyrotoxic sxs: weight loss, heat intolerance, hyperdefecation, palpitations, anxiety.  He is currently on methimazole 5 mg daily.  He also takes a beta-blocker, Toprol, for coronary artery disease.  Per review of Dr. Cordelia Pen note, he declined RAI treatment in the past. - he does not appear to have exogenous causes for the low TSH.  - We discussed that possible causes of thyrotoxicosis are:  Graves ds   Thyroiditis toxic multinodular goiter/ toxic adenoma (I cannot feel nodules at palpation of his thyroid). - will check the TSH, fT3 and fT4 and also add thyroid stimulating antibodies to screen for Graves' disease.  - If the tests remain abnormal, we may need an uptake and scan to differentiate between the 3 above possible etiologies  - possible modalities of treatment for the above conditions, to include continuing methimazole use, radioactive iodine ablation or (last resort) surgery.  For now, we will continue methimazole, since he responded well to a low dose. - we may need to do thyroid ultrasound depending on the results of the uptake and scan (if a cold nodule is present) - at today's visit, he appears euthyroid. He is not tachycardic (at the time of the exam) but he has tremors with outstretched hands.  He does not complain of excessive weight loss, heat intolerance, or anxiety. - no signs of Graves' ophthalmopathy: he does not have any double vision, blurry vision, eye pain,  chemosis. - RTC in 6 months, but likely sooner for repeat labs  Needs refills.  Philemon Kingdom, MD PhD [facility] Endocr[facility]ology

## 2022-09-01 NOTE — Patient Instructions (Addendum)
Please stop at the lab.  Please continue Methimazole 5 mg daily.  You should have an endocrinology follow-up appointment in 6 months.  Hyperthyroidism Hyperthyroidism refers to a thyroid gland that is too active or overactive. The thyroid gland is a small gland located in the lower front part of the neck, just in front of the windpipe (trachea). This gland makes hormones that: Help control how the body uses food for energy (metabolism). Help the heart and brain work well. Keep your bones strong. When the thyroid is overactive, it produces too much of a hormone called thyroxine. What are the causes? This condition may be caused by: Graves' disease. This is a disorder in which the body's disease-fighting system (immune system) attacks the thyroid gland. This is the most common cause. Inflammation of the thyroid gland. A tumor in the thyroid gland. Use of certain medicines, including: Prescription thyroid hormone replacement. Herbal supplements that mimic thyroid hormones. Amiodarone therapy. Solid or fluid-filled lumps within your thyroid gland (thyroid nodules). Taking in a large amount of iodine from foods or medicines. What increases the risk? You are more likely to develop this condition if: You are male. You have a family history of thyroid conditions. You smoke tobacco. You use a medicine called lithium. You take medicines that affect the immune system (immunosuppressants). What are the signs or symptoms? Symptoms of this condition include: Nervousness. Inability to tolerate heat. Diarrhea. Rapid heart rate. Shaky hands. Restlessness. Sleep problems. Other symptoms may include: Heart skipping beats or making extra beats. Unexplained weight loss. Change in the texture of hair or skin. Loss of menstruation. Fatigue. Enlarged thyroid gland or a lump in the thyroid (nodule). You may also have symptoms of Graves' disease, which may include: Protruding eyes. Dry  eyes. Red or swollen eyes. Problems with vision. How is this diagnosed? This condition may be diagnosed based on: Your symptoms and medical history. A physical exam. Blood tests. Thyroid ultrasound. This test involves using sound waves to produce images of the thyroid gland. A thyroid scan. A radioactive substance is injected into a vein, and images show how much iodine is present in the thyroid. Radioactive iodine uptake test (RAIU). A small amount of radioactive iodine is given by mouth to see how much iodine the thyroid absorbs after a certain amount of time. How is this treated? Treatment depends on the cause and severity of the condition. Treatment may include: Medicines to reduce the amount of thyroid hormone your body makes. Medicines to help manage your symptoms. Radioactive iodine treatment (radioiodine therapy). This involves swallowing a small dose of radioactive iodine, in capsule or liquid form, to kill thyroid cells. Surgery to remove part or all of your thyroid gland. You may need to take thyroid hormone replacement medicine for the rest of your life after thyroid surgery. Follow these instructions at home:  Take over-the-counter and prescription medicines only as told by your health care provider. Do not use any products that contain nicotine or tobacco. These products include cigarettes, chewing tobacco, and vaping devices, such as e-cigarettes. If you need help quitting, ask your health care provider. Follow any instructions from your health care provider about diet. You may be instructed to limit foods that contain iodine. Keep all follow-up visits. You will need to have blood tests regularly so that your health care provider can monitor your condition. Where to find more information [employer] of Diabetes and Digestive and Kidney Diseases: [employer] Contact a health care provider if: Your symptoms do not get  better with treatment. You have a fever. You have  abdominal pain. You feel dizzy. You are taking thyroid hormone replacement medicine and: You have symptoms of depression. You feel like you are tired all the time. You gain weight. Get help right away if: You have sudden, unexplained confusion or other mental changes. You have chest pain. You have fast or irregular heartbeats (palpitations). You have difficulty breathing. These symptoms may be an emergency. Get help right away. Call 911. Do not wait to see if the symptoms will go away. Do not drive yourself to the hospital. Summary The thyroid gland is a small gland located in the lower front part of the neck, just in front of the windpipe. Hyperthyroidism is when the thyroid gland is too active and produces too much of a hormone called thyroxine. The most common cause is Graves' disease, a disorder in which your immune system attacks the thyroid gland. Hyperthyroidism can cause various symptoms, such as unexplained weight loss, nervousness, inability to tolerate heat, or changes in your heartbeat. Treatment may include medicine to reduce the amount of thyroid hormone your body makes, radioiodine therapy, surgery, or medicines to manage symptoms. This information is not intended to replace advice given to you by your health care provider. Make sure you discuss any questions you have with your health care provider. Document Revised: 12/27/2021 Document Reviewed: 12/27/2021 Elsevier Patient Education  [place].

## 2022-09-03 LAB — THYROID STIMULATING IMMUNOGLOBULIN: TSI: 204 % baseline — ABNORMAL HIGH (ref <140)

## 2022-09-04 ENCOUNTER — Encounter: Payer: Self-pay | Admitting: Physician Assistant

## 2022-09-04 ENCOUNTER — Ambulatory Visit: Payer: Medicare Other | Admitting: Physician Assistant

## 2022-09-04 VITALS — BP 105/68 | HR 72 | Resp 20 | Ht 71.0 in | Wt 179.0 lb

## 2022-09-04 DIAGNOSIS — F0390 Unspecified dementia without behavioral disturbance: Secondary | ICD-10-CM

## 2022-09-04 DIAGNOSIS — F0150 Vascular dementia without behavioral disturbance: Secondary | ICD-10-CM

## 2022-09-04 MED ORDER — METHIMAZOLE 5 MG PO TABS: 5.0000 mg | ORAL_TABLET | Freq: Every day | ORAL | 3 refills | Status: DC

## 2022-09-04 MED ORDER — DONEPEZIL HCL 10 MG PO TABS
10.0000 mg | ORAL_TABLET | Freq: Every day | ORAL | 3 refills | Status: DC
Start: 2022-09-04 — End: 2023-09-09

## 2022-09-04 NOTE — Progress Notes (Signed)
Assessment/Plan:   Dementia likely due to mixed Alzheimer's and vascular disease  Timothy Gallagher is a very pleasant 86 y.o. RH male seen today in follow up for memory loss. Patient is currently on donepezil 10 mg daily.  February 2023 MRI brain personally reviewed was remarkable for mild to moderate chronic ischemic changes within the cerebral white matter, mild to moderate generalized cerebral atrophy and mild cerebellar atrophy, and subcentimeter chronic infarct within the superior right cerebellar hemisphere.  MMSE today is 23/30, essentially stable from prior.        Follow up in  6 months. Recommend good control of cardiovascular risk factors.    Continue to control mood as per PCP Continue donepezil 10 mg daily Continue B12 supplementation Monitor driving     Subjective:    This patient is accompanied in the office by his wife who supplements the history.  Previous records as well as any outside records available were reviewed prior to todays visit.  Last seen 03/05/2022.  Last MMSE 11/27/2021 was 20/30   Any changes in memory since last visit?  "About the same ", he may forget occasionally recent conversations, or names of people.  However, this is no different from prior. repeats oneself?  Endorsed by his wife, he "repeats himself over and over and over ".  Patient denies Disoriented when walking into a room?  Patient denies   Leaving objects in unusual places?  Patient denies.  Sometimes he does not remember where he left the keys or other objects Ambulates  with difficulty?   Patient denies   Recent falls?  Patient denies   Any head injuries?  Patient denies   History of seizures?   Patient denies   Wandering behavior?  Patient denies   Patient drives? He only drives short distances, longest to [employer].  His wife reports that he needs to be constantly monitored "all he does is driving all day, back-and-forth, and I have to be with him all the time because I am afraid  that he is going to get lost ". Any mood changes since last visit?  Patient denies   Any worsening depression?:  Patient denies   Hallucinations?  Patient denies   Paranoia?  Patient denies   Patient reports that he does not sleep well because he has to get up about 3 times at night to urinate., without vivid dreams, REM behavior or sleepwalking   History of sleep apnea?  Patient denies   Any hygiene concerns?  Patient denies   Independent of bathing and dressing?  Endorsed  Does the patient needs help with medications?  Wife in charge and she prepares the pillbox Who is in charge of the finances?  Wife is in charge   Any changes in appetite?  Patient denies  decreased by wife report.  Patient have trouble swallowing? Patient denies   Does the patient cook?  Patient denies   Any kitchen accidents such as leaving the stove on? Patient denies   Any headaches?  Patient denies   Double vision? Patient denies   Any focal numbness or tingling?  Patient denies   Chronic back pain Patient denies   Unilateral weakness?  Patient denies   Any tremors?  Patient denies   Any history of anosmia?  Patient denies   Any incontinence of urine?  Patient denies   Any bowel dysfunction?   Patient denies      Patient lives with: wife   Initial Visit 11/27/21 [name], [name]  The patient is accompanied by his wife who supplements the history. This is a 86 y.o. year old RH  male who has had memory issues for about 2 years, although he denies any symptoms.  Apparently, he had been referred by PCP for evaluation in September 2021, but he never told his wife about it, and in today's visit, he adamantly refuses that he has any memory problems.  His wife reports that he is repeating the same stories and asking the same questions and this has been worse over the last 6 months.  Sometimes he does not remember where he has  left objects.  He continues to drive only with his wife by his side, she denies that the patient is disoriented but she reports that he only drives short distances.  He denies having any irritability or mood changes, but his wife rolls her eyes after he states that he is "happy-go-lucky person".  Denies any depression.  He admits to not sleeping well because he has to get up around 3 times at night to urinate.  He denies any sleepwalking, REM behavior, hallucinations or paranoia.  He is independent of bathing and dressing, and his wife denies any hygiene concerns.  His medications are in a pillbox, but he forgets to take several doses.  His wife is in charge of the finances for the last 5 years.  His appetite is "too good "and denies trouble swallowing.  He does not cook.  He ambulates without difficulty, he has some chronic right leg pain due to arthritis, and is going to be seen by orthopedics soon.  He denies any falls or head injuries.  He does not use a cane to ambulate.  He denies a history of seizure, headaches, double vision, focal numbness or tingling, unilateral weakness, tremors or anosmia.  He has been evaluated for dizziness in view of orthostatic hypotension. He denies any constipation or diarrhea.  He denies a history of sleep apnea, alcohol or tobacco.  Family history negative for Alzheimer's disease.  He has high School education.  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  Last CT of the head on 12/31/2019 showed chronic small vessel ischemic changes.   Labs  A1c 6.4 TSH 0.24 with free T4 0.84, LDL normal at 75, chemistries normal, B12 normal.   MRI of the brain 12/19/21 No evidence of acute intracranial abnormality.3. Mild-to-moderate chronic small-vessel ischemic changes within the cerebral white matter.4. Subcentimeter chronic infarct within the superior right cerebellar hemisphere.5. Mild-to-moderate  generalized cerebral atrophy.6. Comparatively mild cerebellar atrophy.  PREVIOUS MEDICATIONS:   CURRENT MEDICATIONS:  Outpatient Encounter Medications as of 09/04/2022  Medication Sig   aspirin 81 MG EC tablet Take 1 tablet (81 mg total) by mouth daily.   FLUoxetine (PROZAC) 10 MG capsule Take 1 capsule (10 mg total) by mouth daily.   fluticasone (FLONASE) 50 MCG/ACT nasal spray SHAKE LQ AND U 1 TO 2 SPRAYS IEN D PRF ALLERGIES   methimazole (TAPAZOLE) 5 MG tablet Take 1 tablet (5 mg total) by mouth daily.   Multiple Vitamins-Minerals (CENTRUM SILVER PO) Take 1 tablet by mouth daily.   nitrofurantoin, macrocrystal-monohydrate, (MACROBID) 100 MG capsule Take 1 capsule (100 mg total) by mouth 2 (two) times daily.   omeprazole (PRILOSEC) 20 MG capsule Take 2 capsules (40 mg  total) by mouth daily.   rosuvastatin (CRESTOR) 40 MG tablet Take 1 tablet (40 mg total) by mouth daily. Dose increase 01/13/22 due to heart disease and LDL goal under 70   tamsulosin (FLOMAX) 0.4 MG CAPS capsule Take 0.4 mg by mouth. Prescribed by urology   [DISCONTINUED] donepezil (ARICEPT) 10 MG tablet Take 1 tablet (10 mg total) by mouth daily.   donepezil (ARICEPT) 10 MG tablet Take 1 tablet (10 mg total) by mouth daily.   [DISCONTINUED] methimazole (TAPAZOLE) 5 MG tablet Take 1 tablet (5 mg total) by mouth daily.   No facility-administered encounter medications on file as of 09/04/2022.       09/04/2022    9:00 AM 11/27/2021    9:24 AM 11/01/2020    8:08 PM  MMSE - Mini Mental State Exam  Orientation to time '3 2 3  '$ Orientation to Place '5 4 4  '$ Registration '3 3 3  '$ Attention/ Calculation '5 3 5  '$ Recall 0 0 0  Language- name 2 objects '2 2 2  '$ Language- repeat '1 1 1  '$ Language- follow 3 step command '3 3 3  '$ Language- read & follow direction '1 1 1  '$ Write a sentence 0 1 1  Copy design 0 0 1  Total score '23 20 24       '$ No data to display          Objective:     PHYSICAL EXAMINATION:    VITALS:   Vitals:    09/04/22 0926  BP: 105/68  Pulse: 72  Resp: 20  SpO2: 99%  Weight: 179 lb (81.2 kg)  Height: '5\' 11"'$  (1.803 m)    GEN:  The patient appears stated age and is in NAD. HEENT:  Normocephalic, atraumatic.   Neurological examination:  General: NAD, well-groomed, appears stated age. Orientation: The patient is alert. Oriented to person, place not to date Cranial nerves: There is good facial symmetry.The speech is fluent and clear. No aphasia or dysarthria. Fund of knowledge is appropriate. Recent and remote memory are impaired. Attention and concentration are reduced.  Able to name objects and repeat phrases.  Hearing is intact to conversational tone.   Delayed recall 0/3 Sensation: Sensation is intact to light touch throughout Motor: Strength is at least antigravity x4. Tremors: none  DTR's 2/4 in UE/LE     Movement examination: Tone: There is normal tone in the UE/LE Abnormal movements:  no tremor.  No myoclonus.  No asterixis.   Coordination:  There is no decremation with RAM's. Normal finger to nose  Gait and Station: The patient has no difficulty arising out of a deep-seated chair without the use of the hands. The patient's stride length is good.  Gait is cautious and narrow.    Thank you for allowing [place] the opportunity to participate in the care of this nice patient. Please do not hesitate to contact us for any questions or concerns.   Total time spent on today's visit was 22 minutes dedicated to this patient today, preparing to see patient, examining the patient, ordering tests and/or medications and counseling the patient, documenting clinical information in the EHR or other health record, independently interpreting results and communicating results to the patient/family, discussing treatment and goals, answering patient's questions and coordinating care.  Cc:  [name], MD  Sharene Butters 09/04/2022 12:08 PM

## 2022-09-04 NOTE — Patient Instructions (Addendum)
It was a pleasure to see you today at our office.   Recommendations:  Follow up in 6 months Continue donepezil 10 mg daily.   Make sure you visit the thyroid doctor for monitoring of the medicines  Continue B12 supplement Monitor driving    RECOMMENDATIONS FOR ALL PATIENTS WITH MEMORY PROBLEMS: 1. Continue to exercise (Recommend 30 minutes of walking everyday, or 3 hours every week) 2. Increase social interactions - continue going to [place] and enjoy social gatherings with friends and family 3. Eat healthy, avoid fried foods and eat more fruits and vegetables 4. Maintain adequate blood pressure, blood sugar, and blood cholesterol level. Reducing the risk of stroke and cardiovascular disease also helps promoting better memory. 5. Avoid stressful situations. Live a simple life and avoid aggravations. Organize your time and prepare for the next day in anticipation. 6. Sleep well, avoid any interruptions of sleep and avoid any distractions in the bedroom that may interfere with adequate sleep quality 7. Avoid sugar, avoid sweets as there is a strong link between excessive sugar intake, diabetes, and cognitive impairment We discussed the Mediterranean diet, which has been shown to help patients reduce the risk of progressive memory disorders and reduces cardiovascular risk. This includes eating fish, eat fruits and green leafy vegetables, nuts like almonds and hazelnuts, walnuts, and also use olive oil. Avoid fast foods and fried foods as much as possible. Avoid sweets and sugar as sugar use has been linked to worsening of memory function.  There is always a concern of gradual progression of memory problems. If this is the case, then we may need to adjust level of care according to patient needs. Support, both to the patient and caregiver, should then be put into place.    FALL PRECAUTIONS: Be cautious when walking. Scan the area for obstacles that may increase the risk of trips and falls. When  getting up in the mornings, sit up at the edge of the bed for a few minutes before getting out of bed. Consider elevating the bed at the head end to avoid drop of blood pressure when getting up. Walk always in a well-lit room (use night lights in the walls). Avoid area rugs or power cords from appliances in the middle of the walkways. Use a walker or a cane if necessary and consider physical therapy for balance exercise. Get your eyesight checked regularly.  FINANCIAL OVERSIGHT: Supervision, especially oversight when making financial decisions or transactions is also recommended.  HOME SAFETY: Consider the safety of the kitchen when operating appliances like stoves, microwave oven, and blender. Consider having supervision and share cooking responsibilities until no longer able to participate in those. Accidents with firearms and other hazards in the house should be identified and addressed as well.   ABILITY TO BE LEFT ALONE: If patient is unable to contact 911 operator, consider using [employer], or when the need is there, arrange for someone to stay with patients. Smoking is a fire hazard, consider supervision or cessation. Risk of wandering should be assessed by caregiver and if detected at any point, supervision and safe proof recommendations should be instituted.  MEDICATION SUPERVISION: Inability to self-administer medication needs to be constantly addressed. Implement a mechanism to ensure safe administration of the medications.   DRIVING: Regarding driving, in patients with progressive memory problems, driving will be impaired. We advise to have someone else do the driving if trouble finding directions or if minor accidents are reported. Independent driving assessment is available to determine safety of driving.  If you are interested in the driving assessment, you can contact the following:  [employer] in [place]  [place]  [phone]  [place]  [facility] [phone] or [phone]

## 2022-09-23 ENCOUNTER — Ambulatory Visit (INDEPENDENT_AMBULATORY_CARE_PROVIDER_SITE_OTHER): Payer: Medicare Other

## 2022-09-23 DIAGNOSIS — E538 Deficiency of other specified B group vitamins: Secondary | ICD-10-CM | POA: Diagnosis not present

## 2022-09-23 DIAGNOSIS — N200 Calculus of kidney: Secondary | ICD-10-CM

## 2022-09-23 MED ORDER — CYANOCOBALAMIN 1000 MCG/ML IJ SOLN
1000.0000 ug | Freq: Once | INTRAMUSCULAR | Status: AC
  Administered 2022-09-23: 1000 ug via INTRAMUSCULAR

## 2022-09-23 NOTE — Progress Notes (Signed)
Pt here for B12 injection for Dr. Hunter. Injection given in right deltoid. Pt tolerated well.   

## 2022-10-16 ENCOUNTER — Telehealth: Payer: Self-pay

## 2022-10-16 ENCOUNTER — Ambulatory Visit (INDEPENDENT_AMBULATORY_CARE_PROVIDER_SITE_OTHER): Payer: Medicare Other

## 2022-10-16 VITALS — BP 130/84 | HR 54 | Temp 97.3°F | Wt 181.2 lb

## 2022-10-16 DIAGNOSIS — Z0000 Encounter for general adult medical examination without abnormal findings: Secondary | ICD-10-CM

## 2022-10-16 NOTE — Progress Notes (Signed)
Subjective:   Timothy Gallagher is a 86 y.o. male who presents for Medicare Annual/Subsequent preventive examination.  Review of Systems     Cardiac Risk Factors include: advanced age (>4mn, >>27women);dyslipidemia;hypertension;male gender     Objective:    Today's Vitals   10/16/22 1348  BP: (!) 148/82  Pulse: (!) 54  Temp: (!) 97.3 F (36.3 C)  SpO2: 98%  Weight: 181 lb 3.2 oz (82.2 kg)   Body mass index is 25.27 kg/m.     10/16/2022    1:56 PM 09/04/2022    9:27 AM 03/05/2022    9:17 AM 11/28/2021    1:27 PM 10/03/2021    1:45 PM 12/31/2019   11:00 AM 08/12/2019   11:34 AM  Advanced Directives  Does Patient Have a Medical Advance Directive? No No No Yes Yes No No  Does patient want to make changes to medical advance directive?     Yes (MAU/Ambulatory/Procedural Areas - Information given)    Would patient like information on creating a medical advance directive? No - Patient declined     No - Patient declined Yes (MAU/Ambulatory/Procedural Areas - Information given)    Current Medications (verified) Outpatient Encounter Medications as of 10/16/2022  Medication Sig   aspirin 81 MG EC tablet Take 1 tablet (81 mg total) by mouth daily.   donepezil (ARICEPT) 10 MG tablet Take 1 tablet (10 mg total) by mouth daily.   FLUoxetine (PROZAC) 10 MG capsule Take 1 capsule (10 mg total) by mouth daily.   fluticasone (FLONASE) 50 MCG/ACT nasal spray SHAKE LQ AND U 1 TO 2 SPRAYS IEN D PRF ALLERGIES   methimazole (TAPAZOLE) 5 MG tablet Take 1 tablet (5 mg total) by mouth daily.   Multiple Vitamins-Minerals (CENTRUM SILVER PO) Take 1 tablet by mouth daily.   omeprazole (PRILOSEC) 20 MG capsule Take 2 capsules (40 mg total) by mouth daily.   rosuvastatin (CRESTOR) 40 MG tablet Take 1 tablet (40 mg total) by mouth daily. Dose increase 01/13/22 due to heart disease and LDL goal under 70   tamsulosin (FLOMAX) 0.4 MG CAPS capsule Take 0.4 mg by mouth. Prescribed by urology    [DISCONTINUED] nitrofurantoin, macrocrystal-monohydrate, (MACROBID) 100 MG capsule Take 1 capsule (100 mg total) by mouth 2 (two) times daily.   No facility-administered encounter medications on file as of 10/16/2022.    Allergies (verified) Lipitor [atorvastatin], Trazodone and nefazodone, Ciprofloxacin, Mycophenolate mofetil, Amoxicillin, Penicillins, and Rosuvastatin   History: Past Medical History:  Diagnosis Date   ACNE ROSACEA 06/27/2009   ACTINIC KERATOSIS, HEAD 04/18/2009   Acute maxillary sinusitis 05/14/2010   ALLERGIC RHINITIS 04/09/2007   Anal fissure 04/07/2016   B12 DEFICIENCY 06/07/2007   BACK PAIN WITH RADICULOPATHY 04/24/2008   Bilateral nephrolithiasis 07/07/2022   CT abd 06/2022 1. Nonobstructive bilateral nephrolithiasis measuring up to 5 mm on the right and 6 mm on the left.   Cancer ([facility]    skin   CHEST WALL PAIN, ACUTE 06/15/2009   Chronic maxillary sinusitis 05/29/2008   COLITIS 04/27/2009   COLONIC POLYPS, HX OF 04/27/2009   tubular adenomas   Coronary artery disease 05/12/2014   Cath 05/12/2014 w/ severe 3-vessel CAD and preserved LV function, EF 55%   DERMATITIS, ATOPIC 10/12/2007   Diverticular disease 07/07/2022   CT abd 06/2022 . Colonic diverticulosis with no acute diverticulitis.   DIVERTICULOSIS, COLON 04/27/2009   ECCHYMOSES, SPONTANEOUS 06/27/2008   Elevated sedimentation rate 05/02/2009   ESOPHAGEAL STRICTURE 04/27/2009   GASTRITIS, CHRONIC  04/27/2009   HYPERLIPIDEMIA 03/13/2008   HYPERTENSION 04/09/2007   Hyperthyroidism    Iliac aneurysm ([place]) 07/07/2022   CT abd 06/2022 . Aneurysmal left common iliac artery (1.6 cm).     Internal bleeding hemorrhoids 01/22/2015   01/22/2015 Seen at anoscopy, grade 1 all 3 positions    Irritable bowel syndrome 08/11/2007   KIDNEY DISEASE 04/09/2007   NECK PAIN 09/14/2008   NEUROPATHY, IDIOPATHIC PERIPHERAL NEC 08/11/2007   OSTEOARTHRITIS, WRIST, RIGHT 08/05/2010   Postoperative delirium 05/20/2014   S/P  CABG x 4 05/19/2014   LIMA to LAD, SVG to diag, SVG to OM, SVG to PDA, EVH via right thigh and leg   Past Surgical History:  Procedure Laterality Date   CARDIAC CATHETERIZATION     COLONOSCOPY     CORONARY ARTERY BYPASS GRAFT N/A 05/19/2014   Procedure: CORONARY ARTERY BYPASS GRAFTING (CABG);  Surgeon: Rexene Alberts, MD;  Location: [place];  Service: Open Heart Surgery;  Laterality: N/A;  Times 4 using left internal mammary artery and endoscopically harvested right saphenous vein   ESOPHAGOGASTRODUODENOSCOPY     FINGER SURGERY     cut off end of finger   FLEXIBLE SIGMOIDOSCOPY     HEMORRHOID BANDING     HERNIA REPAIR     INCISION AND DRAINAGE WOUND WITH FOREIGN BODY REMOVAL Left 12/20/2013   Procedure: INCISION AND DRAINAGE LEFT INDEX FINGER;  Surgeon: Tennis Must, MD;  Location: [facility];  Service: Orthopedics;  Laterality: Left;   INTRAOPERATIVE TRANSESOPHAGEAL ECHOCARDIOGRAM N/A 05/19/2014   Procedure: INTRAOPERATIVE TRANSESOPHAGEAL ECHOCARDIOGRAM;  Surgeon: Rexene Alberts, MD;  Location: [place];  Service: Open Heart Surgery;  Laterality: N/A;   lamenectomy     LEFT HEART CATHETERIZATION WITH CORONARY ANGIOGRAM N/A 05/12/2014   Procedure: LEFT HEART CATHETERIZATION WITH CORONARY ANGIOGRAM;  Surgeon: Sinclair Grooms, MD;  Location: [facility] C[facility] LAB;  Service: Cardiovascular;  Laterality: N/A;   LUMBAR FUSION     TONSILLECTOMY     VARICOSE VEIN SURGERY Left    Family History  Problem Relation Age of Onset   Hernia Mother    Heart disease Father        smoker   COPD Father    Heart attack Father    Heart attack Brother    Early death Daughter    Colon cancer Neg Hx    Esophageal cancer Neg Hx    Pancreatic cancer Neg Hx    Social History   Socioeconomic History   Marital status: Married    Spouse name: Not on file   Number of children: 3   Years of education: 11   Highest education level: Not on file  Occupational History   Occupation: retired    Fish farm manager: RETIRED    Comment:  from [place] Use   Smoking status: Former    Packs/day: 0.50    Years: 42.00    Total pack years: 21.00    Types: Cigarettes    Quit date: 11/17/1998    Years since quitting: 23.9   Smokeless tobacco: Never  Vaping Use   Vaping Use: Never used  Substance and Sexual Activity   Alcohol use: No   Drug use: No   Sexual activity: Yes  Other Topics Concern   Not on file  Social History Narrative   Married 30 years (2nd marriage). 2 kids from previous marriage (lost a 57rd child hit by car at age 34). 2 stepchildren. 6 grandkids. 2 greatgrandkids.  Retired from [employer]: walking/exercise, building in shop-furniture (cut finger off in February doing this)   6 grand children - Has bought them all a new car    Brother died of probably addiction   Sister also died; not sure of cause    He enjoys his life; Likes to play the keyboard; guitar    Had played in a gospel quartet    Enjoys working with wood    Right handed   Drinks caffeine   Social Determinants of Health   Financial Resource Strain: Low Risk  (10/16/2022)   Overall Financial Resource Strain (CARDIA)    Difficulty of Paying Living Expenses: Not hard at all  Food Insecurity: No Food Insecurity (10/16/2022)   Hunger Vital Sign    Worried About Running Out of Food in the Last Year: Never true    Ran Out of Food in the Last Year: Never true  Transportation Needs: No Transportation Needs (10/16/2022)   PRAPARE - Hydrologist (Medical): No    Lack of Transportation (Non-Medical): No  Physical Activity: Inactive (10/16/2022)   Exercise Vital Sign    Days of Exercise per Week: 0 days    Minutes of Exercise per Session: 0 min  Stress: No Stress Concern Present (10/16/2022)   [place]    Feeling of Stress : Not at all  Social Connections: Moderately Isolated (10/16/2022)   Social  Connection and Isolation Panel [NHANES]    Frequency of Communication with Friends and Family: Once a week    Frequency of Social Gatherings with Friends and Family: More than three times a week    Attends Religious Services: Never    Marine scientist or Organizations: No    Attends Music therapist: Never    Marital Status: Married    Tobacco Counseling Counseling given: Not Answered   Clinical Intake:  Pre-visit preparation completed: Yes  Pain : No/denies pain     BMI - recorded: 25.27 Nutritional Status: BMI 25 -29 Overweight Nutritional Risks: None Diabetes: No  How often do you need to have someone help you when you read instructions, pamphlets, or other written materials from your doctor or pharmacy?: 1 - Never  Diabetic?no  Interpreter Needed?: No  Information entered by :: Charlott Rakes, LPN   Activities of Daily Living    10/16/2022    1:57 PM  In your present state of health, do you have any difficulty performing the following activities:  Hearing? 1  Comment HOH hearing issue  Vision? 0  Difficulty concentrating or making decisions? 0  Walking or climbing stairs? 0  Dressing or bathing? 0  Doing errands, shopping? 0  Preparing Food and eating ? N  Using the Toilet? N  In the past six months, have you accidently leaked urine? N  Do you have problems with loss of bowel control? N  Managing your Medications? N  Managing your Finances? N  Housekeeping or managing your Housekeeping? N    Patient Care Team: [name], MD as PCP - General (Family Medicine) Belva Crome, MD as PCP - Cardiology (Cardiology) Bjorn Loser, MD as Consulting Physician (Urology) Alanda Slim, Neena Rhymes, MD as Consulting Physician (Ophthalmology) Renato Shin, MD (Inactive) as Consulting Physician (Endocrinology) Leonie Man, MD as Consulting Physician (Cardiology) Elease Hashimoto (Neurology)  Indicate any recent Medical  Services you may have received  from other than [facility] providers in the past year (date may be approximate).     Assessment:   This is a routine wellness examination for Leeandre.  Hearing/Vision screen Hearing Screening - Comments:: Pt has hearing aid HOH  Vision Screening - Comments:: Pt follows up with Dr Alanda Slim for annul eye exams   Dietary issues and exercise activities discussed: Current Exercise Habits: The patient does not participate in regular exercise at present   Goals Addressed             This Visit's Progress    Patient Stated       Stay healthy        Depression Screen    10/16/2022    1:55 PM 06/19/2022    4:18 PM 10/03/2021    1:44 PM 05/16/2021    8:36 AM 01/01/2021   11:21 AM 09/27/2020    1:55 PM 02/24/2020   10:27 AM  PHQ 2/9 Scores  PHQ - 2 Score 0 0 0 0 0 0 0    Fall Risk    10/16/2022    1:57 PM 09/04/2022    9:27 AM 06/19/2022    4:18 PM 03/05/2022    9:17 AM 11/28/2021    1:26 PM  [place] in the past year? 0 0 0 0 0  Number falls in past yr: 0 0 0 0 0  Injury with Fall? 0 0 0 0 0  Risk for fall due to : Impaired vision  No Fall Risks    Follow up Falls prevention discussed Falls evaluation completed Falls evaluation completed      FALL RISK PREVENTION PERTAINING TO THE HOME:  Any stairs in or around the home? No  If so, are there any without handrails? No  Home free of loose throw rugs in walkways, pet beds, electrical cords, etc? Yes  Adequate lighting in your home to reduce risk of falls? Yes   ASSISTIVE DEVICES UTILIZED TO PREVENT FALLS:  Life alert? No  Use of a cane, walker or w/c? No  Grab bars in the bathroom? Yes  Shower chair or bench in shower? Yes  Elevated toilet seat or a handicapped toilet? No   TIMED UP AND GO:  Was the test performed? Yes .  Length of time to ambulate 10 feet: 10 sec.   Gait steady and fast without use of assistive device  Cognitive Function:    09/04/2022    9:00 AM 11/27/2021     9:24 AM 11/01/2020    8:08 PM 11/01/2020    4:42 PM  MMSE - Mini Mental State Exam  Orientation to time '3 2 3 3  '$ Orientation to Place '5 4 4 4  '$ Registration '3 3 3 3  '$ Attention/ Calculation '5 3 5 5  '$ Recall 0 0 0 0  Language- name 2 objects '2 2 2 2  '$ Language- repeat '1 1 1 1  '$ Language- follow 3 step command '3 3 3 3  '$ Language- read & follow direction '1 1 1 1  '$ Write a sentence 0 '1 1 1  '$ Copy design 0 0 1 1  Total score '23 20 24 24        '$ 10/16/2022    1:58 PM 10/03/2021    1:49 PM 06/14/2018    9:14 AM  6CIT Screen  What Year? 4 points 4 points 0 points  What month? 0 points 0 points 0 points  What time? 0 points 0 points 0 points  Count  back from 20 0 points 0 points 0 points  Months in reverse 4 points 4 points 0 points  Repeat phrase 2 points 10 points   Total Score 10 points 18 points     Immunizations Immunization History  Administered Date(s) Administered   Fluad Quad(high Dose 65+) 07/28/2019, 08/22/2020   Influenza Split 08/11/2011, 08/05/2012   Influenza Whole 08/31/2007, 08/15/2008, 08/20/2009, 08/05/2010   Influenza, High Dose Seasonal PF 07/28/2016, 09/10/2017, 08/13/2018, 08/23/2021   Influenza,inj,Quad PF,6+ Mos 07/15/2013, 07/20/2014, 08/10/2015   Influenza,inj,quad, With Preservative 08/17/2018   PFIZER(Purple Top)SARS-COV-2 Vaccination 12/02/2019, 12/22/2019, 08/28/2020   Pfizer Covid-19 Vaccine Bivalent Booster 52yr & up 10/16/2021   Pneumococcal Conjugate-13 11/04/2013   Pneumococcal Polysaccharide-23 02/05/2015   Td 03/18/2007   Tdap 06/04/2018   Zoster Recombinat (Shingrix) 08/15/2019, 10/31/2019   Zoster, Live 04/16/2011    TDAP status: Up to date  Flu Vaccine status: Up to date pt stated   Pneumococcal vaccine status: Up to date  Covid-19 vaccine status: Completed vaccines  Qualifies for Shingles Vaccine? Yes   Zostavax completed Yes   Shingrix Completed?: Yes  Screening Tests Health Maintenance  Topic Date Due   INFLUENZA VACCINE   06/17/2022   COVID-19 Vaccine (5 - 2023-24 season) 07/18/2022   Medicare Annual Wellness (AWV)  10/17/2023   DTaP/Tdap/Td (3 - Td or Tdap) 06/04/2028   Pneumonia Vaccine 86 Years old  Completed   Zoster Vaccines- Shingrix  Completed   HPV VACCINES  Aged Out    Health Maintenance  Health Maintenance Due  Topic Date Due   INFLUENZA VACCINE  06/17/2022   COVID-19 Vaccine (5 - 2023-24 season) 07/18/2022    Colorectal cancer screening: No longer required.    Additional Screening:   Vision Screening: Recommended annual ophthalmology exams for early detection of glaucoma and other disorders of the eye. Is the patient up to date with their annual eye exam?  Yes  Who is the provider or what is the name of the office in which the patient attends annual eye exams? Dr MAlanda Slim If pt is not established with a provider, would they like to be referred to a provider to establish care? No .   Dental Screening: Recommended annual dental exams for proper oral hygiene  Community Resource Referral / Chronic Care Management: CRR required this visit?  No   CCM required this visit?  No      Plan:     I have personally reviewed and noted the following in the patient's chart:   Medical and social history Use of alcohol, tobacco or illicit drugs  Current medications and supplements including opioid prescriptions. Patient is not currently taking opioid prescriptions. Functional ability and status Nutritional status Physical activity Advanced directives List of other physicians Hospitalizations, surgeries, and ER visits in previous 12 months Vitals Screenings to include cognitive, depression, and falls Referrals and appointments  In addition, I have reviewed and discussed with patient certain preventive protocols, quality metrics, and best practice recommendations. A written personalized care plan for preventive services as well as general preventive health recommendations were provided to  patient.     TWillette Brace LPN   [id number]  Nurse Notes: pt is asking if he is supose to be taking sertraline 25 mg they had a script for it and has 2 bottles but have not started it, please advise

## 2022-10-16 NOTE — Patient Instructions (Signed)
Timothy Gallagher , Thank you for taking time to come for your Medicare Wellness Visit. I appreciate your ongoing commitment to your health goals. Please review the following plan we discussed and let me know if I can assist you in the future.   These are the goals we discussed:  Goals      Exercise 150 min/wk Moderate Activity     Increase physical activity      Patient Stated     None at this time     Patient Stated     None at this time        This is a list of the screening recommended for you and due dates:  Health Maintenance  Topic Date Due   Flu Shot  06/17/2022   COVID-19 Vaccine (5 - 2023-24 season) 07/18/2022   Medicare Annual Wellness Visit  10/03/2022   DTaP/Tdap/Td vaccine (3 - Td or Tdap) 06/04/2028   Pneumonia Vaccine  Completed   Zoster (Shingles) Vaccine  Completed   HPV Vaccine  Aged Out    Advanced directives: Please bring a copy of your health care power of attorney and living will to the office at your convenience.  Conditions/risks identified: stay healthy   Next appointment: Follow up in one year for your annual wellness visit.   Preventive Care 54 Years and Older, Male  Preventive care refers to lifestyle choices and visits with your health care provider that can promote health and wellness. What does preventive care include? A yearly physical exam. This is also called an annual well check. Dental exams once or twice a year. Routine eye exams. Ask your health care provider how often you should have your eyes checked. Personal lifestyle choices, including: Daily care of your teeth and gums. Regular physical activity. Eating a healthy diet. Avoiding tobacco and drug use. Limiting alcohol use. Practicing safe sex. Taking low doses of aspirin every day. Taking vitamin and mineral supplements as recommended by your health care provider. What happens during an annual well check? The services and screenings done by your health care provider during  your annual well check will depend on your age, overall health, lifestyle risk factors, and family history of disease. Counseling  Your health care provider may ask you questions about your: Alcohol use. Tobacco use. Drug use. Emotional well-being. Home and relationship well-being. Sexual activity. Eating habits. History of falls. Memory and ability to understand (cognition). Work and work Statistician. Screening  You may have the following tests or measurements: Height, weight, and BMI. Blood pressure. Lipid and cholesterol levels. These may be checked every 5 years, or more frequently if you are over 83 years old. Skin check. Lung cancer screening. You may have this screening every year starting at age 74 if you have a 30-pack-year history of smoking and currently smoke or have quit within the past 15 years. Fecal occult blood test (FOBT) of the stool. You may have this test every year starting at age 51. Flexible sigmoidoscopy or colonoscopy. You may have a sigmoidoscopy every 5 years or a colonoscopy every 10 years starting at age 62. Prostate cancer screening. Recommendations will vary depending on your family history and other risks. Hepatitis C blood test. Hepatitis B blood test. Sexually transmitted disease (STD) testing. Diabetes screening. This is done by checking your blood sugar (glucose) after you have not eaten for a while (fasting). You may have this done every 1-3 years. Abdominal aortic aneurysm (AAA) screening. You may need this if you are a  current or former smoker. Osteoporosis. You may be screened starting at age 51 if you are at high risk. Talk with your health care provider about your test results, treatment options, and if necessary, the need for more tests. Vaccines  Your health care provider may recommend certain vaccines, such as: Influenza vaccine. This is recommended every year. Tetanus, diphtheria, and acellular pertussis (Tdap, Td) vaccine. You may need  a Td booster every 10 years. Zoster vaccine. You may need this after age 45. Pneumococcal 13-valent conjugate (PCV13) vaccine. One dose is recommended after age 38. Pneumococcal polysaccharide (PPSV23) vaccine. One dose is recommended after age 63. Talk to your health care provider about which screenings and vaccines you need and how often you need them. This information is not intended to replace advice given to you by your health care provider. Make sure you discuss any questions you have with your health care provider. Document Released: 11/30/2015 Document Revised: 07/23/2016 Document Reviewed: 09/04/2015 Elsevier Interactive Patient Education  2017 [place] Prevention in the Home Falls can cause injuries. They can happen to people of all ages. There are many things you can do to make your home safe and to help prevent falls. What can I do on the outside of my home? Regularly fix the edges of walkways and driveways and fix any cracks. Remove anything that might make you trip as you walk through a door, such as a raised step or threshold. Trim any bushes or trees on the path to your home. Use bright outdoor lighting. Clear any walking paths of anything that might make someone trip, such as rocks or tools. Regularly check to see if handrails are loose or broken. Make sure that both sides of any steps have handrails. Any raised decks and porches should have guardrails on the edges. Have any leaves, snow, or ice cleared regularly. Use sand or salt on walking paths during winter. Clean up any spills in your garage right away. This includes oil or grease spills. What can I do in the bathroom? Use night lights. Install grab bars by the toilet and in the tub and shower. Do not use towel bars as grab bars. Use non-skid mats or decals in the tub or shower. If you need to sit down in the shower, use a plastic, non-slip stool. Keep the floor dry. Clean up any water that spills on the  floor as soon as it happens. Remove soap buildup in the tub or shower regularly. Attach bath mats securely with double-sided non-slip rug tape. Do not have throw rugs and other things on the floor that can make you trip. What can I do in the bedroom? Use night lights. Make sure that you have a light by your bed that is easy to reach. Do not use any sheets or blankets that are too big for your bed. They should not hang down onto the floor. Have a firm chair that has side arms. You can use this for support while you get dressed. Do not have throw rugs and other things on the floor that can make you trip. What can I do in the kitchen? Clean up any spills right away. Avoid walking on wet floors. Keep items that you use a lot in easy-to-reach places. If you need to reach something above you, use a strong step stool that has a grab bar. Keep electrical cords out of the way. Do not use floor polish or wax that makes floors slippery. If you must use  wax, use non-skid floor wax. Do not have throw rugs and other things on the floor that can make you trip. What can I do with my stairs? Do not leave any items on the stairs. Make sure that there are handrails on both sides of the stairs and use them. Fix handrails that are broken or loose. Make sure that handrails are as long as the stairways. Check any carpeting to make sure that it is firmly attached to the stairs. Fix any carpet that is loose or worn. Avoid having throw rugs at the top or bottom of the stairs. If you do have throw rugs, attach them to the floor with carpet tape. Make sure that you have a light switch at the top of the stairs and the bottom of the stairs. If you do not have them, ask someone to add them for you. What else can I do to help prevent falls? Wear shoes that: Do not have high heels. Have rubber bottoms. Are comfortable and fit you well. Are closed at the toe. Do not wear sandals. If you use a stepladder: Make sure that  it is fully opened. Do not climb a closed stepladder. Make sure that both sides of the stepladder are locked into place. Ask someone to hold it for you, if possible. Clearly mark and make sure that you can see: Any grab bars or handrails. First and last steps. Where the edge of each step is. Use tools that help you move around (mobility aids) if they are needed. These include: Canes. Walkers. Scooters. Crutches. Turn on the lights when you go into a dark area. Replace any light bulbs as soon as they burn out. Set up your furniture so you have a clear path. Avoid moving your furniture around. If any of your floors are uneven, fix them. If there are any pets around you, be aware of where they are. Review your medicines with your doctor. Some medicines can make you feel dizzy. This can increase your chance of falling. Ask your doctor what other things that you can do to help prevent falls. This information is not intended to replace advice given to you by your health care provider. Make sure you discuss any questions you have with your health care provider. Document Released: 08/30/2009 Document Revised: 04/10/2016 Document Reviewed: 12/08/2014 Elsevier Interactive Patient Education  2017 [employer].

## 2022-10-16 NOTE — Telephone Encounter (Signed)
No answer will call back for further instruction

## 2022-10-17 ENCOUNTER — Telehealth: Payer: Self-pay

## 2022-10-17 NOTE — Telephone Encounter (Signed)
Spoke with wife and verbal understanding confirmed prozac only, I was also able to locate his immunizations, He was unaware of where he got them and what he had taken. Information added to health maintenance as requested.    Charlott Rakes

## 2022-10-17 NOTE — Telephone Encounter (Signed)
Great job! thanks

## 2022-10-21 ENCOUNTER — Encounter: Payer: Self-pay | Admitting: Family Medicine

## 2022-10-21 ENCOUNTER — Ambulatory Visit (INDEPENDENT_AMBULATORY_CARE_PROVIDER_SITE_OTHER): Payer: Medicare Other | Admitting: Family Medicine

## 2022-10-21 VITALS — BP 132/78 | HR 67 | Temp 98.0°F | Ht 71.0 in | Wt 180.8 lb

## 2022-10-21 DIAGNOSIS — R739 Hyperglycemia, unspecified: Secondary | ICD-10-CM

## 2022-10-21 DIAGNOSIS — I25810 Atherosclerosis of coronary artery bypass graft(s) without angina pectoris: Secondary | ICD-10-CM

## 2022-10-21 DIAGNOSIS — E538 Deficiency of other specified B group vitamins: Secondary | ICD-10-CM | POA: Diagnosis not present

## 2022-10-21 DIAGNOSIS — I714 Abdominal aortic aneurysm, without rupture, unspecified: Secondary | ICD-10-CM

## 2022-10-21 DIAGNOSIS — I10 Essential (primary) hypertension: Secondary | ICD-10-CM

## 2022-10-21 DIAGNOSIS — E785 Hyperlipidemia, unspecified: Secondary | ICD-10-CM

## 2022-10-21 NOTE — Progress Notes (Signed)
Phone [phone] In person visit   Subjective:   Timothy Gallagher is a 86 y.o. year old very pleasant male patient who presents for/with See problem oriented charting Chief Complaint  Patient presents with   Follow-up    No questions or concerns    Hypertension    Past Medical History-  Patient Active Problem List   Diagnosis Date Noted   Mixed vascular and neurodegenerative dementia without behavioral disturbance ([place]) 11/28/2021    Priority: High   Hyperthyroidism 01/13/2018    Priority: High   Detached retina, left 06/30/2017    Priority: High   AAA (abdominal aortic aneurysm) ([place]) 05/12/2017    Priority: High   CAD (coronary artery disease) of artery bypass graft 05/19/2014    Priority: High   Hyperglycemia 07/22/2022    Priority: Medium    Iliac aneurysm ([place]) 07/07/2022    Priority: Medium    Abdominal aortic aneurysm (AAA) without rupture, unspecified part ([place]) 11/27/2021    Priority: Medium    History of atrial fibrillation 08/26/2019    Priority: Medium    BPPV (benign paroxysmal positional vertigo) 09/01/2016    Priority: Medium    Insomnia 11/22/2014    Priority: Medium    Anemia 07/20/2014    Priority: Medium    BPH (benign prostatic hyperplasia) 12/05/2013    Priority: Medium    Hyperlipidemia 03/13/2008    Priority: Medium    B12 deficiency 06/07/2007    Priority: Medium    Essential hypertension 04/09/2007    Priority: Medium    Diverticular disease 07/07/2022    Priority: Low   Chronic obstructive pulmonary disease ([place]) 08/26/2019    Priority: Low   Left sided abdominal pain 04/15/2017    Priority: Low   Low back pain 02/06/2017    Priority: Low   Anal fissure 04/07/2016    Priority: Low   Internal and external bleeding hemorrhoids 01/22/2015    Priority: Low   Palpitations 02/11/2013    Priority: Low   OSTEOARTHRITIS, WRIST, RIGHT 08/05/2010    Priority: Low   ACNE ROSACEA 06/27/2009    Priority: Low   ESOPHAGEAL STRICTURE  04/27/2009    Priority: Low   ACTINIC KERATOSIS, HEAD 04/18/2009    Priority: Low   NECK PAIN 09/14/2008    Priority: Low   NEUROPATHY, IDIOPATHIC PERIPHERAL NEC 08/11/2007    Priority: Low   Irritable bowel syndrome 08/11/2007    Priority: Low   ALLERGIC RHINITIS 04/09/2007    Priority: Low   Bilateral nephrolithiasis 07/07/2022   Enlarged prostate 07/07/2022   Degeneration of lumbar intervertebral disc 10/21/2018    Medications- reviewed and updated Current Outpatient Medications  Medication Sig Dispense Refill   aspirin 81 MG EC tablet Take 1 tablet (81 mg total) by mouth daily.     donepezil (ARICEPT) 10 MG tablet Take 1 tablet (10 mg total) by mouth daily. 90 tablet 3   FLUoxetine (PROZAC) 10 MG capsule Take 1 capsule (10 mg total) by mouth daily. 30 capsule 11   fluticasone (FLONASE) 50 MCG/ACT nasal spray SHAKE LQ AND U 1 TO 2 SPRAYS IEN D PRF ALLERGIES     methimazole (TAPAZOLE) 5 MG tablet Take 1 tablet (5 mg total) by mouth daily. 90 tablet 3   Multiple Vitamins-Minerals (CENTRUM SILVER PO) Take 1 tablet by mouth daily.     omeprazole (PRILOSEC) 20 MG capsule Take 2 capsules (40 mg total) by mouth daily. 30 capsule 0   rosuvastatin (CRESTOR) 40 MG tablet Take  1 tablet (40 mg total) by mouth daily. Dose increase 01/13/22 due to heart disease and LDL goal under 70 90 tablet 3   tamsulosin (FLOMAX) 0.4 MG CAPS capsule Take 0.4 mg by mouth. Prescribed by urology     No current facility-administered medications for this visit.     Objective:  BP 132/78   Pulse 67   Temp 98 F (36.7 C) (Temporal)   Ht '5\' 11"'$  (1.803 m)   Wt 180 lb 12.8 oz (82 kg)   SpO2 97%   BMI 25.22 kg/m  Gen: NAD, resting comfortably CV: RRR no murmurs rubs or gallops Lungs: CTAB no crackles, wheeze, rhonchi Ext: no edema Skin: warm, dry     Assessment and Plan   #Dementia likely due to mixed vascular and Alzheimer's dementia-follows with Dr. Toy Baker, PA #irritability- trial  zoloft 25 mg starting 03/17/22 (had nausea)- later changed to fluoxetine 10 mg (no issues with nausea) S: Medication: Donepezil 10 mg- has not noted any worsening -Most recent MRI 12/19/2021 prior cerebellar stroke -improved irritability on fluoxetine- nausea better  A/P: Dementia- Controlled. Continue current medications.  Irritability- improved on fluoxetine with no side effects mentioned- continue current medications     #CAD- with history of CABG in 2015 - follows with Dr. Daneen Schick #hyperlipidemia - LDL goal under 70 S: Medication: rosuvastatin 81 mg, aspirin 81 mg   No chest pain or shortness of breath Lab Results  Component Value Date   CHOL 107 07/22/2022   HDL 41.90 07/22/2022   LDLCALC 48 07/22/2022   LDLDIRECT 75.0 11/27/2021   TRIG 85.0 07/22/2022   CHOLHDL 3 07/22/2022   A/P: cad asymptomatic- continue current medications  Lipids - Controlled. Continue current medications. LDL at goal under 70  %hyperthyroidism- follows with Dr. Cruzita Lederer S: compliant On thyroid medication- methimazole 2.5 mg BID Lab Results  Component Value Date   TSH 5.37 09/01/2022  A/P:Controlled. Continue current medications. - continue endocrine follow up   #hypertension/orthostatic hypotension S: medication:  none -orthostatic intolerance on ARB and beta blocker in past- and BP has trended down. Still with some orthostatic issues on tamsulosin0 but not since last visit BP Readings from Last 3 Encounters:  10/21/22 132/78  10/16/22 130/84  09/04/22 105/68  A/P: doing well without meds- hesitant to push for systolic <762 with age and orthostatic issues  # AAA- Stable 3.3 cm infrarenal AAA on 01/24/20- repeat 3 years planned - repeat next year  # BPH S:history of severe UTI leading to hospitalization. Orthostatic issues but feel we have to leave him on flomax  A/P: doing well- continue current meds   # B12 deficiency S: Current treatment/medication (oral vs. IM):  1069mg injections montly Lab  Results  Component Value Date   VITAMINB12 >1500 (H) 07/22/2022  A/P: suspect stable- continue current medications    # Hyperglycemia/insulin resistance/prediabetes- peak a1c of 6.4 S:  Medication: none Lab Results  Component Value Date   HGBA1C 6.5 07/22/2022   HGBA1C 6.4 11/27/2021   HGBA1C 6.4 05/16/2021   A/P: if has another a1c in this range will need to diagnose diabetes - recommended regular exercise 30 minutes a day and healthy eating - diet rich in vegetables and low in processed food. Avoid sugars like desserts or sodas as much as possible  Recommended follow up: Return in about 4 months (around 02/20/2023) for physical or sooner if needed.Schedule b4 you leave. Future Appointments  Date Time Provider [place] 03/05/2023 10:00 AM GPhilemon Kingdom  MD [facility] None  03/10/2023  9:00 AM Wertman, Coralee Pesa, PA-C [facility] None   Lab/Order associations:   ICD-10-CM   1. Coronary artery disease involving coronary bypass graft of native heart without angina pectoris  I25.810     2. Abdominal aortic aneurysm (AAA) without rupture, unspecified part ([place])  I71.40     3. B12 deficiency  E53.8     4. Hyperlipidemia, unspecified hyperlipidemia type  E78.5     5. Essential hypertension  I10     6. Hyperglycemia  R73.9       No orders of the defined types were placed in this encounter.   Return precautions advised.  [name], MD

## 2022-10-21 NOTE — Addendum Note (Signed)
Addended by: [name] on: 10/21/2022 12:01 PM   Modules accepted: Level of Service

## 2022-10-21 NOTE — Patient Instructions (Addendum)
Health Maintenance Due  Topic Date Due   COVID-19 Vaccine (6 - 2023-24 season) 10/21/2022  Team please try to get date of this from him  B12 injection today  - recommended regular exercise 30 minutes a day and healthy eating - diet rich in vegetables and low in processed food. Avoid sugars like desserts or sodas as much as possible  No labs today  Recommended follow up: Return in about 4 months (around 02/20/2023) for physical or sooner if needed.Schedule b4 you leave. -schedule monthly b12 visits

## 2022-11-28 ENCOUNTER — Encounter: Payer: Self-pay | Admitting: Family Medicine

## 2022-11-28 ENCOUNTER — Ambulatory Visit (INDEPENDENT_AMBULATORY_CARE_PROVIDER_SITE_OTHER): Payer: BLUE CROSS/BLUE SHIELD | Admitting: Family Medicine

## 2022-11-28 VITALS — BP 118/68 | HR 63 | Temp 98.5°F | Ht 71.0 in | Wt 182.2 lb

## 2022-11-28 DIAGNOSIS — F0150 Vascular dementia without behavioral disturbance: Secondary | ICD-10-CM | POA: Diagnosis not present

## 2022-11-28 DIAGNOSIS — E538 Deficiency of other specified B group vitamins: Secondary | ICD-10-CM | POA: Diagnosis not present

## 2022-11-28 DIAGNOSIS — I25810 Atherosclerosis of coronary artery bypass graft(s) without angina pectoris: Secondary | ICD-10-CM | POA: Diagnosis not present

## 2022-11-28 DIAGNOSIS — E785 Hyperlipidemia, unspecified: Secondary | ICD-10-CM | POA: Diagnosis not present

## 2022-11-28 MED ORDER — FLUOXETINE HCL 20 MG PO CAPS: 20.0000 mg | ORAL_CAPSULE | Freq: Every day | ORAL | 3 refills | Status: DC

## 2022-11-28 MED ORDER — CYANOCOBALAMIN 1000 MCG/ML IJ SOLN
1000.0000 ug | Freq: Once | INTRAMUSCULAR | Status: AC
  Administered 2022-11-28: 1000 ug via INTRAMUSCULAR

## 2022-11-28 NOTE — Progress Notes (Signed)
Pt received b12 in left deltoid during appt, pt tolerated well.

## 2022-11-28 NOTE — Progress Notes (Signed)
Phone [phone] In person visit   Subjective:   Timothy Gallagher is a 87 y.o. year old very pleasant male patient who presents for/with See problem oriented charting Chief Complaint  Patient presents with   B12 Injections    Pt here for b12 injections and back problems    Follow-up   Past Medical History-  Patient Active Problem List   Diagnosis Date Noted   Mixed vascular and neurodegenerative dementia without behavioral disturbance ([place]) 11/28/2021    Priority: High   Hyperthyroidism 01/13/2018    Priority: High   Detached retina, left 06/30/2017    Priority: High   AAA (abdominal aortic aneurysm) ([place]) 05/12/2017    Priority: High   CAD (coronary artery disease) of artery bypass graft 05/19/2014    Priority: High   Hyperglycemia 07/22/2022    Priority: Medium    Iliac aneurysm ([place]) 07/07/2022    Priority: Medium    Abdominal aortic aneurysm (AAA) without rupture, unspecified part ([place]) 11/27/2021    Priority: Medium    History of atrial fibrillation 08/26/2019    Priority: Medium    BPPV (benign paroxysmal positional vertigo) 09/01/2016    Priority: Medium    Insomnia 11/22/2014    Priority: Medium    Anemia 07/20/2014    Priority: Medium    BPH (benign prostatic hyperplasia) 12/05/2013    Priority: Medium    Hyperlipidemia 03/13/2008    Priority: Medium    B12 deficiency 06/07/2007    Priority: Medium    Essential hypertension 04/09/2007    Priority: Medium    Diverticular disease 07/07/2022    Priority: Low   Chronic obstructive pulmonary disease ([place]) 08/26/2019    Priority: Low   Left sided abdominal pain 04/15/2017    Priority: Low   Low back pain 02/06/2017    Priority: Low   Anal fissure 04/07/2016    Priority: Low   Internal and external bleeding hemorrhoids 01/22/2015    Priority: Low   Palpitations 02/11/2013    Priority: Low   OSTEOARTHRITIS, WRIST, RIGHT 08/05/2010    Priority: Low   ACNE ROSACEA 06/27/2009    Priority: Low    ESOPHAGEAL STRICTURE 04/27/2009    Priority: Low   ACTINIC KERATOSIS, HEAD 04/18/2009    Priority: Low   NECK PAIN 09/14/2008    Priority: Low   NEUROPATHY, IDIOPATHIC PERIPHERAL NEC 08/11/2007    Priority: Low   Irritable bowel syndrome 08/11/2007    Priority: Low   ALLERGIC RHINITIS 04/09/2007    Priority: Low   Bilateral nephrolithiasis 07/07/2022   Enlarged prostate 07/07/2022   Degeneration of lumbar intervertebral disc 10/21/2018    Medications- reviewed and updated Current Outpatient Medications  Medication Sig Dispense Refill   aspirin 81 MG EC tablet Take 1 tablet (81 mg total) by mouth daily.     donepezil (ARICEPT) 10 MG tablet Take 1 tablet (10 mg total) by mouth daily. 90 tablet 3   fluticasone (FLONASE) 50 MCG/ACT nasal spray SHAKE LQ AND U 1 TO 2 SPRAYS IEN D PRF ALLERGIES     methimazole (TAPAZOLE) 5 MG tablet Take 1 tablet (5 mg total) by mouth daily. 90 tablet 3   Multiple Vitamins-Minerals (CENTRUM SILVER PO) Take 1 tablet by mouth daily.     omeprazole (PRILOSEC) 20 MG capsule Take 2 capsules (40 mg total) by mouth daily. 30 capsule 0   rosuvastatin (CRESTOR) 40 MG tablet Take 1 tablet (40 mg total) by mouth daily. Dose increase 01/13/22 due to heart disease  and LDL goal under 70 90 tablet 3   tamsulosin (FLOMAX) 0.4 MG CAPS capsule Take 0.4 mg by mouth. Prescribed by urology     FLUoxetine (PROZAC) 20 MG capsule Take 1 capsule (20 mg total) by mouth daily. 90 capsule 3   No current facility-administered medications for this visit.     Objective:  BP 118/68   Pulse 63   Temp 98.5 F (36.9 C) (Temporal)   Ht '5\' 11"'$  (1.803 m)   Wt 182 lb 3.2 oz (82.6 kg)   SpO2 97%   BMI 25.41 kg/m  Gen: NAD, resting comfortably CV: RRR no murmurs rubs or gallops Lungs: CTAB no crackles, wheeze, rhonchi  Ext: no edema Skin: warm, dry Msk: right lateral back pain over thoracic back. No arm weakness. No pain over cervical spine. 5/5 grip strength and push and pull     Assessment and Plan   # Right upper back pain S:2-3 weeks. Does not recall heavy lifting or an injury. Hurts in the am when he wakes up- sore to touch in area. Sleeps on both sides so not always on this side. Mild burning of tongue but reports doing better- warm salt- wife says 2-3 weeks but comes and goes (also wonder about burning mouth syndrome- could be anxiety/depression relatedd.   . Takes MV. No right arm pain or neck pain. No shortness of breath  -voltaren and heat both helpful- he reports this resolves issues A/P: area is painful- but with how quickly it resolves likely not more significant issues- continue heat and voltaren gel. Could refer to PT or [facility] [place] [facility]  #Dementia likely due to mixed vascular and Alzheimer's dementia-follows with Dr. Toy Baker, PA #irritability- trial zoloft 25 mg starting 03/17/22 (had nausea)- later changed to fluoxetine 10 mg (no issues with nausea) S: Medication: Donepezil 10 mg -Most recent MRI 12/19/2021 prior cerebellar stroke  Reports from family that patient has been increasingly irritable despite medicine at '10mg'$  fluoxetine.  A/P: Dementia ongoing issues- continue follow up with neurology -from my perspective with worsening irritability- will increase prozac 20 mg -very small chance of increasing orthostatic symptoms-  discussed this with wife- but we feel jointly that benefits outweigh risks  #CAD- with history of CABG in 2015 - follows with Dr. Daneen Schick #hyperlipidemia - LDL goal under 70 S: Medication: rosuvastatin 40 mg, aspirin 81 mg   -no chest pain or shortness of breath  Lab Results  Component Value Date   CHOL 107 07/22/2022   HDL 41.90 07/22/2022   LDLCALC 48 07/22/2022   LDLDIRECT 75.0 11/27/2021   TRIG 85.0 07/22/2022   CHOLHDL 3 07/22/2022   A/P: lipids at goal. CAD asymptomatic- continue current medications   #hypertension/orthostatic hypotension S: medication:  none -orthostatic intolerance on ARB and beta  blocker in past- and BP has trended down. Still with some orthostatic issues on tamsulosin BP Readings from Last 3 Encounters:  11/28/22 118/68  10/21/22 132/78  10/16/22 130/84  A/P: stable without medicines- continue to monitor. Still orthostatic issues noted- but do ont think we can stop his flomax with history of severe UTI leading to hospitalization.   # B12 deficiency S: Current treatment/medication (oral vs. IM):  1051mg injections monthly- last shot December 5th before today Lab Results  Component Value Date   VITAMINB12 >1500 (H) 07/22/2022  A/P: has been doing well/controlled- received maintenance injection today- check b12 at least annually   # Hyperglycemia/insulin resistance/prediabetes- peak a1c of 6.4 S:  Medication: none  Exercise and diet- not exercising, eating a lot of snacks- cookies, potato chips, cheese doodles Lab Results  Component Value Date   HGBA1C 6.5 07/22/2022   HGBA1C 6.4 11/27/2021   HGBA1C 6.4 05/16/2021  A/P: prediabetes previously diagnosed but last a1c in diabetes range- if we has another test in this range will formally call it diabetes -has space to improve diet with healthier snacks  Recommended follow up: Return in about 4 months (around 03/29/2023) for followup or sooner if needed.Schedule b4 you leave. Future Appointments  Date Time Provider [facility]  03/05/2023 10:00 AM Philemon Kingdom, MD [facility] None  03/10/2023  9:00 AM Rondel Jumbo, PA-C [facility] None    Lab/Order associations:   ICD-10-CM   1. B12 deficiency  E53.8 cyanocobalamin (VITAMIN B12) injection 1,000 mcg    2. Mixed vascular and neurodegenerative dementia without behavioral disturbance (HCC)  F01.50     3. Coronary artery disease involving coronary bypass graft of native heart without angina pectoris  I25.810     4. Hyperlipidemia, unspecified hyperlipidemia type  E78.5       Meds ordered this encounter  Medications   cyanocobalamin (VITAMIN B12)  injection 1,000 mcg   FLUoxetine (PROZAC) 20 MG capsule    Sig: Take 1 capsule (20 mg total) by mouth daily.    Dispense:  90 capsule    Refill:  3    Return precautions advised.  [name], MD

## 2022-11-28 NOTE — Patient Instructions (Addendum)
Continue b12 shots monthly    will increase prozac 20 mg  Consider labs next time  If back issues do not continue to quickly resolve with heat or voltaren gel or other topicals please call us back- continue sports medicine consult or physical therapy  Recommended follow up: Return in about 4 months (around 03/29/2023) for followup or sooner if needed.Schedule b4 you leave.

## 2022-12-17 ENCOUNTER — Telehealth: Payer: Self-pay

## 2022-12-17 NOTE — Telephone Encounter (Signed)
Tried to contact wife [name] [phone], line busy at 2:54pm, needs to contact [employer]
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
## 2022-12-17 NOTE — Telephone Encounter (Signed)
Pt's daughter called in wanting to schedule a sooner appt. She stated that they are trying to figure out how to keep the pt from driving. The pt doesn't listen to anything they say and after him getting lost yesterday they are not sure what all to do with him. I let her know she is not on the DPR. She said she is with the pt's wife right now.

## 2022-12-18 ENCOUNTER — Encounter: Payer: Self-pay | Admitting: Family Medicine

## 2022-12-18 ENCOUNTER — Ambulatory Visit (INDEPENDENT_AMBULATORY_CARE_PROVIDER_SITE_OTHER): Payer: Medicare Other | Admitting: Family Medicine

## 2022-12-18 VITALS — BP 99/59 | HR 85 | Temp 98.3°F | Ht 71.0 in | Wt 181.0 lb

## 2022-12-18 DIAGNOSIS — R32 Unspecified urinary incontinence: Secondary | ICD-10-CM

## 2022-12-18 DIAGNOSIS — F0150 Vascular dementia without behavioral disturbance: Secondary | ICD-10-CM

## 2022-12-18 NOTE — Telephone Encounter (Signed)
Timothy Gallagher [phone], wife advised, thanked me for calling

## 2022-12-18 NOTE — Telephone Encounter (Signed)
No answer again at 200 12/18/2022
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
## 2022-12-18 NOTE — Patient Instructions (Addendum)
I recommend you stop driving. I am going to file my concern with the DMV after the silver alert that I received.   Also strongly recommend working with attorney for living will and health care power of attorney  Please stop by lab before you go- for urine If you have mychart- we will send your results within 3 business days of [place] receiving them.  If you do not have mychart- we will call you about results within 5 business days of [place] receiving them.  *please also note that you will see labs on mychart as soon as they post. I will later go in and write notes on them- will say "notes from [name]"   Recommended follow up: Return in about 3 months (around 03/18/2023) for followup or sooner if needed.Schedule b4 you leave.

## 2022-12-19 ENCOUNTER — Telehealth: Payer: Self-pay | Admitting: Anesthesiology
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
## 2022-12-20 LAB — URINE CULTURE
MICRO NUMBER:: 14505742
SPECIMEN QUALITY:: ADEQUATE

## 2022-12-22 ENCOUNTER — Other Ambulatory Visit: Payer: Self-pay | Admitting: Family Medicine

## 2022-12-22 MED ORDER — NITROFURANTOIN MONOHYD MACRO 100 MG PO CAPS: 100.0000 mg | ORAL_CAPSULE | Freq: Two times a day (BID) | ORAL | 0 refills | Status: DC

## 2022-12-31 ENCOUNTER — Encounter: Payer: Self-pay | Admitting: Family Medicine

## 2022-12-31 ENCOUNTER — Ambulatory Visit (INDEPENDENT_AMBULATORY_CARE_PROVIDER_SITE_OTHER): Payer: Medicare Other | Admitting: Family Medicine

## 2022-12-31 ENCOUNTER — Ambulatory Visit (HOSPITAL_COMMUNITY)
Admission: RE | Admit: 2022-12-31 | Discharge: 2022-12-31 | Disposition: A | Discharge status: Home / Self Care | Payer: Medicare Other | Source: Ambulatory Visit | Attending: Family Medicine | Admitting: Family Medicine

## 2022-12-31 VITALS — BP 124/72 | HR 65 | Temp 98.2°F | Ht 71.0 in | Wt 184.6 lb

## 2022-12-31 DIAGNOSIS — I25810 Atherosclerosis of coronary artery bypass graft(s) without angina pectoris: Secondary | ICD-10-CM | POA: Diagnosis not present

## 2022-12-31 DIAGNOSIS — E538 Deficiency of other specified B group vitamins: Secondary | ICD-10-CM

## 2022-12-31 DIAGNOSIS — R0789 Other chest pain: Secondary | ICD-10-CM | POA: Insufficient documentation

## 2022-12-31 DIAGNOSIS — I10 Essential (primary) hypertension: Secondary | ICD-10-CM

## 2022-12-31 DIAGNOSIS — E785 Hyperlipidemia, unspecified: Secondary | ICD-10-CM

## 2022-12-31 MED ORDER — CYANOCOBALAMIN 1000 MCG/ML IJ SOLN
1000.0000 ug | Freq: Once | INTRAMUSCULAR | Status: AC
  Administered 2022-12-31: 1000 ug via INTRAMUSCULAR

## 2022-12-31 NOTE — Patient Instructions (Addendum)
Please go to [facility]  - located [address] across the [address] from [place] - in the basement - Hours: 8:30-5:00 PM M-F (with lunch from 12:30- 1 PM). You do NOT need an appointment.    Lets get a chest x-ray Discussed EKG but you wanted to hold off for now Try heating pad 15 minutes 3x a day to see if that helps If worsening or other new symptoms see me back  B12 injection today  Recommended follow up: Return for as needed for new, worsening, persistent symptoms.

## 2022-12-31 NOTE — Progress Notes (Signed)
Phone [phone] In person visit   Subjective:   Timothy Gallagher is a 87 y.o. year old very pleasant male patient who presents for/with See problem oriented charting Chief Complaint  Patient presents with   chest tenderness    (NO MASK)Pt c/o right sided chest tenderness x5-6 month that goes and comes.   Past Medical History-  Patient Active Problem List   Diagnosis Date Noted   Mixed vascular and neurodegenerative dementia without behavioral disturbance ([place]) 11/28/2021    Priority: High   Hyperthyroidism 01/13/2018    Priority: High   Detached retina, left 06/30/2017    Priority: High   AAA (abdominal aortic aneurysm) ([place]) 05/12/2017    Priority: High   CAD (coronary artery disease) of artery bypass graft 05/19/2014    Priority: High   Hyperglycemia 07/22/2022    Priority: Medium    Iliac aneurysm ([place]) 07/07/2022    Priority: Medium    Abdominal aortic aneurysm (AAA) without rupture, unspecified part ([place]) 11/27/2021    Priority: Medium    History of atrial fibrillation 08/26/2019    Priority: Medium    BPPV (benign paroxysmal positional vertigo) 09/01/2016    Priority: Medium    Insomnia 11/22/2014    Priority: Medium    Anemia 07/20/2014    Priority: Medium    BPH (benign prostatic hyperplasia) 12/05/2013    Priority: Medium    Hyperlipidemia 03/13/2008    Priority: Medium    B12 deficiency 06/07/2007    Priority: Medium    Essential hypertension 04/09/2007    Priority: Medium    Diverticular disease 07/07/2022    Priority: Low   Chronic obstructive pulmonary disease ([place]) 08/26/2019    Priority: Low   Left sided abdominal pain 04/15/2017    Priority: Low   Low back pain 02/06/2017    Priority: Low   Anal fissure 04/07/2016    Priority: Low   Internal and external bleeding hemorrhoids 01/22/2015    Priority: Low   Palpitations 02/11/2013    Priority: Low   OSTEOARTHRITIS, WRIST, RIGHT 08/05/2010    Priority: Low   ACNE ROSACEA 06/27/2009     Priority: Low   ESOPHAGEAL STRICTURE 04/27/2009    Priority: Low   ACTINIC KERATOSIS, HEAD 04/18/2009    Priority: Low   NECK PAIN 09/14/2008    Priority: Low   NEUROPATHY, IDIOPATHIC PERIPHERAL NEC 08/11/2007    Priority: Low   Irritable bowel syndrome 08/11/2007    Priority: Low   ALLERGIC RHINITIS 04/09/2007    Priority: Low   Bilateral nephrolithiasis 07/07/2022   Enlarged prostate 07/07/2022   Degeneration of lumbar intervertebral disc 10/21/2018    Medications- reviewed and updated Current Outpatient Medications  Medication Sig Dispense Refill   aspirin 81 MG EC tablet Take 1 tablet (81 mg total) by mouth daily.     donepezil (ARICEPT) 10 MG tablet Take 1 tablet (10 mg total) by mouth daily. 90 tablet 3   FLUoxetine (PROZAC) 20 MG capsule Take 1 capsule (20 mg total) by mouth daily. 90 capsule 3   methimazole (TAPAZOLE) 5 MG tablet Take 1 tablet (5 mg total) by mouth daily. 90 tablet 3   Multiple Vitamins-Minerals (CENTRUM SILVER PO) Take 1 tablet by mouth daily.     nitrofurantoin, macrocrystal-monohydrate, (MACROBID) 100 MG capsule Take 1 capsule (100 mg total) by mouth 2 (two) times daily. 20 capsule 0   rosuvastatin (CRESTOR) 40 MG tablet Take 1 tablet (40 mg total) by mouth daily. Dose increase 01/13/22 due to  heart disease and LDL goal under 70 90 tablet 3   tamsulosin (FLOMAX) 0.4 MG CAPS capsule Take 0.4 mg by mouth. Prescribed by urology     No current facility-administered medications for this visit.     Objective:  BP 124/72   Pulse 65   Temp 98.2 F (36.8 C)   Ht 5' 11"$  (1.803 m)   Wt 184 lb 9.6 oz (83.7 kg)   SpO2 98%   BMI 25.75 kg/m  Gen: NAD, resting comfortably CV: RRR no murmurs rubs or gallops Right-sided chest wall tenderness noted without rash-about 5-8 cm lateral to the nipple almost in a band from upper chest to lower portion of the chest.  No lymphadenopathy noted.  No gynecomastia noted Lungs: CTAB no crackles, wheeze, rhonchi Abdomen:  soft/nontender/nondistended/normal bowel sounds. No rebound or guarding.  Ext: no edema Skin: warm, dry     Assessment and Plan   # Right-sided chest pain S: Family reports patient has been complaining of right-sided chest tenderness for a month. Happens every single day- wakes up with right lateral chest wall pain that goes away with tylenol but often comes back later in the day and requires a second dose. No shortness of breath with this. Not exertional. Does hurt when he pushes on the chest wall. No fall or injury that we are aware of but does have memory loss so may have forgotten. No neck pain or arm pain. He reports as mild pain but can be a throbbing pain. Feels like worse on days he sleeps on it.  -wakes up with it- not worse after meals   He saw cardiology Dr. [name] [phone] but he reported no chest pain at that time A/P: Right-sided chest wall pain-history is somewhat limited by patient's memory loss/dementia-I wonder if he may have fallen and hit his chest and some point.  No exertional symptoms to suggest cardiac cause and we discussed cardiac workup but he and family wanted to hold off for now-no shortness of breath, no left-sided or central chest pain, no exertional symptoms.  Also doubt something like pulmonary embolism-reassuring vital signs and no distress-no tachycardia or hypoxia.  Does have a history of AAA but no known thoracic aneurysm-if had widened mediastinum would have lower threshold for CT angiogram  From [facility]:  " Lets get a chest x-ray Discussed EKG but you wanted to hold off for now Try heating pad 15 minutes 3x a day to see if that helps If worsening or other new symptoms see me back"  # UTI-patient still finishing antibiotic for recent UTI  #CAD- with history of CABG in 2015 - follows with Dr. Daneen Schick #hyperlipidemia - LDL goal under 70 S: Medication: rosuvastatin 40 mg, aspirin 81 mg   Lab Results  Component Value Date   CHOL 107 07/22/2022   HDL 41.90  07/22/2022   LDLCALC 48 07/22/2022   LDLDIRECT 75.0 11/27/2021   TRIG 85.0 07/22/2022   CHOLHDL 3 07/22/2022    A/P: CAD asymptomatic-as above do not think right-sided chest pain symptoms likely cardiac related but offered EKG he declined - Lipids with excellent control-continue current medication for this and CAD  #hypertension/orthostatic hypotension S: medication:  none -orthostatic intolerance on ARB and beta blocker in past- and BP has trended down. Still with some orthostatic issues on tamsulosin A/P: Blood pressure remains very well-controlled without medication  # B12 deficiency S: Current treatment/medication (oral vs. IM):  1079mg injections monthly   Lab Results  Component Value Date  VITAMINB12 >1500 (H) 07/22/2022   A/P: Well-controlled with monthly injections-for convenience he wanted injection today and we will postpone next injection to 5 weeks  Recommended follow up: Return for as needed for new, worsening, persistent symptoms. Future Appointments  Date Time Provider [place]  03/05/2023 10:00 AM Philemon Kingdom, MD [facility] None  03/10/2023  9:00 AM Rondel Jumbo, PA-C [facility] None    Lab/Order associations:   ICD-10-CM   1. B12 deficiency  E53.8 cyanocobalamin (VITAMIN B12) injection 1,000 mcg    2. Right-sided chest wall pain  R07.89 DG Chest 2 View    3. Coronary artery disease involving coronary bypass graft of native heart without angina pectoris  I25.810     4. Essential hypertension  I10     5. Hyperlipidemia, unspecified hyperlipidemia type  E78.5       Meds ordered this encounter  Medications   cyanocobalamin (VITAMIN B12) injection 1,000 mcg    Return precautions advised.  [name], MD

## 2023-01-05 ENCOUNTER — Encounter: Payer: Self-pay | Admitting: Family Medicine

## 2023-01-05 DIAGNOSIS — I700 Atherosclerosis of aorta: Secondary | ICD-10-CM | POA: Insufficient documentation

## 2023-01-15 ENCOUNTER — Telehealth: Payer: Self-pay | Admitting: Physician Assistant

## 2023-01-15 ENCOUNTER — Ambulatory Visit: Payer: Medicare Other | Admitting: Family Medicine

## 2023-01-15 NOTE — Telephone Encounter (Signed)
Pt's wife called in stating the pt had wondered off overnight a little while back and the police office told her that they cannot issue a silver alert if they do not have documented proof that he has dementia. She would like a letter to be mailed to her (not the patient) stating he has been diagnosed with dementia. That way if he ever wonders off again the police will be able to issue a Silver Alert for him.

## 2023-01-16 NOTE — Telephone Encounter (Signed)
Letter written

## 2023-01-28 DIAGNOSIS — H35372 Puckering of macula, left eye: Secondary | ICD-10-CM | POA: Diagnosis not present

## 2023-01-28 DIAGNOSIS — H31002 Unspecified chorioretinal scars, left eye: Secondary | ICD-10-CM | POA: Diagnosis not present

## 2023-01-28 DIAGNOSIS — H353131 Nonexudative age-related macular degeneration, bilateral, early dry stage: Secondary | ICD-10-CM | POA: Diagnosis not present

## 2023-02-10 ENCOUNTER — Ambulatory Visit (INDEPENDENT_AMBULATORY_CARE_PROVIDER_SITE_OTHER): Payer: Medicare Other | Admitting: Family Medicine

## 2023-02-10 ENCOUNTER — Encounter: Payer: Self-pay | Admitting: Family Medicine

## 2023-02-10 VITALS — BP 122/62 | HR 71 | Temp 97.9°F | Ht 71.0 in | Wt 185.2 lb

## 2023-02-10 DIAGNOSIS — E538 Deficiency of other specified B group vitamins: Secondary | ICD-10-CM | POA: Diagnosis not present

## 2023-02-10 DIAGNOSIS — R0789 Other chest pain: Secondary | ICD-10-CM | POA: Diagnosis not present

## 2023-02-10 MED ORDER — CYANOCOBALAMIN 1000 MCG/ML IJ SOLN
1000.0000 ug | Freq: Once | INTRAMUSCULAR | Status: AC
  Administered 2023-02-10: 1000 ug via INTRAMUSCULAR

## 2023-02-10 NOTE — Progress Notes (Signed)
Subjective:     Patient ID: Timothy Gallagher, male    DOB: [birth year], 87 y.o.   MRN: [id number]  Chief Complaint  Patient presents with   Pain    Right -sided pain off and on for about 7 weeks, no pain today     HPI-here w/wife Intermitt R sided pain for 7 wks.  Ok today.  Had cxr-ok.  Will last 1-2 days. Today is fine.  No ppt factor. Taking tylenol 1-2x/day.  Occ aleve.no n/v/d/c.  More like a "pulled muscle" but no know injury(reviewed Dr. Ansel Bong notes-dementia so if fell/injured, may not remember). Not exertional, not food related  There are no preventive care reminders to display for this patient.  Past Medical History:  Diagnosis Date   ACNE ROSACEA 06/27/2009   ACTINIC KERATOSIS, HEAD 04/18/2009   Acute maxillary sinusitis 05/14/2010   ALLERGIC RHINITIS 04/09/2007   Anal fissure 04/07/2016   B12 DEFICIENCY 06/07/2007   BACK PAIN WITH RADICULOPATHY 04/24/2008   Bilateral nephrolithiasis 07/07/2022   CT abd 06/2022 1. Nonobstructive bilateral nephrolithiasis measuring up to 5 mm on the right and 6 mm on the left.   Cancer [facility])    skin   CHEST WALL PAIN, ACUTE 06/15/2009   Chronic maxillary sinusitis 05/29/2008   COLITIS 04/27/2009   COLONIC POLYPS, HX OF 04/27/2009   tubular adenomas   Coronary artery disease 05/12/2014   Cath 05/12/2014 w/ severe 3-vessel CAD and preserved LV function, EF 55%   DERMATITIS, ATOPIC 10/12/2007   Diverticular disease 07/07/2022   CT abd 06/2022 . Colonic diverticulosis with no acute diverticulitis.   DIVERTICULOSIS, COLON 04/27/2009   ECCHYMOSES, SPONTANEOUS 06/27/2008   Elevated sedimentation rate 05/02/2009   ESOPHAGEAL STRICTURE 04/27/2009   GASTRITIS, CHRONIC 04/27/2009   HYPERLIPIDEMIA 03/13/2008   HYPERTENSION 04/09/2007   Hyperthyroidism    Iliac aneurysm ([place]) 07/07/2022   CT abd 06/2022 . Aneurysmal left common iliac artery (1.6 cm).     Internal bleeding hemorrhoids 01/22/2015   01/22/2015 Seen at anoscopy, grade 1 all 3  positions    Irritable bowel syndrome 08/11/2007   KIDNEY DISEASE 04/09/2007   NECK PAIN 09/14/2008   NEUROPATHY, IDIOPATHIC PERIPHERAL NEC 08/11/2007   OSTEOARTHRITIS, WRIST, RIGHT 08/05/2010   Postoperative delirium 05/20/2014   S/P CABG x 4 05/19/2014   LIMA to LAD, SVG to diag, SVG to OM, SVG to PDA, EVH via right thigh and leg    Past Surgical History:  Procedure Laterality Date   CARDIAC CATHETERIZATION     COLONOSCOPY     CORONARY ARTERY BYPASS GRAFT N/A 05/19/2014   Procedure: CORONARY ARTERY BYPASS GRAFTING (CABG);  Surgeon: Rexene Alberts, MD;  Location: [place];  Service: Open Heart Surgery;  Laterality: N/A;  Times 4 using left internal mammary artery and endoscopically harvested right saphenous vein   ESOPHAGOGASTRODUODENOSCOPY     FINGER SURGERY     cut off end of finger   FLEXIBLE SIGMOIDOSCOPY     HEMORRHOID BANDING     HERNIA REPAIR     INCISION AND DRAINAGE WOUND WITH FOREIGN BODY REMOVAL Left 12/20/2013   Procedure: INCISION AND DRAINAGE LEFT INDEX FINGER;  Surgeon: Tennis Must, MD;  Location: [facility];  Service: Orthopedics;  Laterality: Left;   INTRAOPERATIVE TRANSESOPHAGEAL ECHOCARDIOGRAM N/A 05/19/2014   Procedure: INTRAOPERATIVE TRANSESOPHAGEAL ECHOCARDIOGRAM;  Surgeon: Rexene Alberts, MD;  Location: [place];  Service: Open Heart Surgery;  Laterality: N/A;   lamenectomy     LEFT HEART CATHETERIZATION WITH CORONARY ANGIOGRAM  N/A 05/12/2014   Procedure: LEFT HEART CATHETERIZATION WITH CORONARY ANGIOGRAM;  Surgeon: Sinclair Grooms, MD;  Location: [facility];  Service: Cardiovascular;  Laterality: N/A;   LUMBAR FUSION     TONSILLECTOMY     VARICOSE VEIN SURGERY Left     Outpatient Medications Prior to Visit  Medication Sig Dispense Refill   aspirin 81 MG EC tablet Take 1 tablet (81 mg total) by mouth daily.     donepezil (ARICEPT) 10 MG tablet Take 1 tablet (10 mg total) by mouth daily. 90 tablet 3   FLUoxetine (PROZAC) 20 MG capsule Take 1 capsule (20 mg total)  by mouth daily. 90 capsule 3   methimazole (TAPAZOLE) 5 MG tablet Take 1 tablet (5 mg total) by mouth daily. 90 tablet 3   Multiple Vitamins-Minerals (CENTRUM SILVER PO) Take 1 tablet by mouth daily.     nitrofurantoin, macrocrystal-monohydrate, (MACROBID) 100 MG capsule Take 1 capsule (100 mg total) by mouth 2 (two) times daily. 20 capsule 0   rosuvastatin (CRESTOR) 40 MG tablet Take 1 tablet (40 mg total) by mouth daily. Dose increase 01/13/22 due to heart disease and LDL goal under 70 90 tablet 3   tamsulosin (FLOMAX) 0.4 MG CAPS capsule Take 0.4 mg by mouth. Prescribed by urology     No facility-administered medications prior to visit.    Allergies  Allergen Reactions   Lipitor [Atorvastatin] Other (See Comments)    REACTION: nausea and blurred vision   Trazodone And Nefazodone Other (See Comments)    dizzy   Ciprofloxacin Swelling   Mycophenolate Mofetil Other (See Comments)    REACTION: unspecified   Amoxicillin Rash    Has patient had a PCN reaction causing immediate rash, facial/tongue/throat swelling, SOB or lightheadedness with hypotension: Unknown Has patient had a PCN reaction causing severe rash involving mucus membranes or skin necrosis: Unknown Has patient had a PCN reaction that required hospitalization: Unknown Has patient had a PCN reaction occurring within the last 10 years: Unknown If all of the above answers are "NO", then may proceed with Cephalosporin use.    Penicillins Rash    Has patient had a PCN reaction causing immediate rash, facial/tongue/throat swelling, SOB or lightheadedness with hypotension: Unknown Has patient had a PCN reaction causing severe rash involving mucus membranes or skin necrosis: Unknown Has patient had a PCN reaction that required hospitalization: Unknown Has patient had a PCN reaction occurring within the last 10 years: Unknown If all of the above answers are "NO", then may proceed with Cephalosporin use.    Rosuvastatin Other (See  Comments)    Unknown    ROS neg/noncontributory except as noted HPI/below      Objective:     BP 122/62 (BP Location: Left Arm, Patient Position: Sitting, Cuff Size: Normal)   Pulse 71   Temp 97.9 F (36.6 C) (Temporal)   Ht 5\' 11"  (1.803 m)   Wt 185 lb 4 oz (84 kg)   SpO2 95%   BMI 25.84 kg/m  Wt Readings from Last 3 Encounters:  02/10/23 185 lb 4 oz (84 kg)  12/31/22 184 lb 9.6 oz (83.7 kg)  12/18/22 181 lb (82.1 kg)    Physical Exam   Gen: WDWN NAD HEENT: NCAT, conjunctiva not injected, sclera nonicteric CARDIAC: RRR, S1S2+, no murmur.  LUNGS: CTAB. No wheezes ABDOMEN:  BS+, soft, NTND, No HSM, no masses EXT:  no edema MSK: no gross abnormalities. No TTP entire chest wall-front and back.  No rash. No  bruising NEURO: A&O.  CN II-XII intact.   Pt joking a lot PSYCH: normal mood. Good eye contact  Reviewed notes from 2/14 and cxr.       Assessment & Plan:   Problem List Items Addressed This Visit       Other   B12 deficiency   Other Visit Diagnoses     Right-sided chest wall pain    -  Primary     1.  Right chest wall pain-may have been more of a muscle pull.  Doubt cardiac.  Doubt GI.  Chest x-ray was negative.  It resolves easily with Tylenol.  Some days does not take any's, some days takes it twice a day.  Advised that due to age, things do take longer to heal.  Taking Tylenol daily to twice daily is fine.  Continue to monitor.  If getting worse, not resolving in 1 month or so, follow-up. 2.  B12 deficiency-B12 injection today  Meds ordered this encounter  Medications   cyanocobalamin (VITAMIN B12) injection 1,000 mcg    Wellington Hampshire, MD

## 2023-03-03 ENCOUNTER — Ambulatory Visit: Payer: Medicare Other | Admitting: Family Medicine

## 2023-03-05 ENCOUNTER — Encounter: Payer: Self-pay | Admitting: Internal Medicine

## 2023-03-05 ENCOUNTER — Ambulatory Visit: Payer: Medicare Other | Admitting: Internal Medicine

## 2023-03-05 VITALS — BP 138/86 | HR 82 | Ht 71.0 in | Wt 182.0 lb

## 2023-03-05 DIAGNOSIS — E0590 Thyrotoxicosis, unspecified without thyrotoxic crisis or storm: Secondary | ICD-10-CM

## 2023-03-05 DIAGNOSIS — E0500 Thyrotoxicosis with diffuse goiter without thyrotoxic crisis or storm: Secondary | ICD-10-CM | POA: Diagnosis not present

## 2023-03-05 LAB — TSH: TSH: 7.85 u[IU]/mL — ABNORMAL HIGH (ref 0.35–5.50)

## 2023-03-05 LAB — T3, FREE: T3, Free: 2.8 pg/mL (ref 2.3–4.2)

## 2023-03-05 LAB — T4, FREE: Free T4: 0.74 ng/dL (ref 0.60–1.60)

## 2023-03-05 MED ORDER — METHIMAZOLE 5 MG PO TABS: 2.5000 mg | ORAL_TABLET | Freq: Every day | ORAL | 3 refills | Status: DC

## 2023-03-05 NOTE — Progress Notes (Signed)
Patient ID: Timothy Gallagher, male   DOB: Jun 19, 1936, 87 y.o.   MRN: [id number]  HPI  Timothy Gallagher is a 87 y.o.-year-old male, returning for follow-up for thyrotoxicosis, diagnosed as Graves' disease 08/2022.  He previously saw Dr. Everardo All, but last visit with me 6 months ago.  He is here with his wife who offers part of the information especially related to past medical history, previous studies done and medication.  Interim history: Patient feels well, without complaints. However, it is difficult to extract information from him as he jokes continuously.  Wife confirms that he did not complain to her about new symptoms in the last 6 months  Reviewed history: -Patient was diagnosed with thyrotoxicosis in 2019 -We confirmed Graves' disease in 08/2022 by elevated TSI's -He did not have imaging or thyroid antibody checks -He is currently on methimazole 5 mg daily  I reviewed pt's thyroid tests: Lab Results  Component Value Date   TSH 5.37 09/01/2022   TSH 5.60 (H) 07/22/2022   TSH 3.86 02/11/2022   TSH 0.24 (L) 11/27/2021   TSH 0.02 (L) 10/01/2021   TSH 0.98 03/26/2021   TSH 4.62 (H) 10/01/2020   TSH 6.65 (H) 07/25/2020   TSH 1.719 12/31/2019   TSH 3.77 07/29/2019   FREET4 0.70 09/01/2022   FREET4 0.76 07/22/2022   FREET4 0.72 02/11/2022   FREET4 0.84 11/27/2021   FREET4 1.43 10/01/2021   FREET4 0.93 03/26/2021   FREET4 0.84 10/01/2020   FREET4 0.68 07/25/2020   FREET4 0.79 07/29/2019   FREET4 0.84 04/28/2019   T3FREE 3.2 09/01/2022   T3FREE 3.8 01/13/2018   Antithyroid antibodies: Lab Results  Component Value Date   TSI 204 (H) 09/01/2022   Pt denies: - feeling nodules in neck - hoarseness - dysphagia >> improved after starting Omeprazole - choking  Ba swallow (06/24/2022): 1. No esophageal mucosal lesions or strictures seen. 2. Significant esophageal dysmotility. 3. Esophagus does clear in the upright position with both water and dry swallowing, allowing for  normal passage of a barium tablet.  He denies: - fatigue - excessive sweating/heat intolerance - tremors (he has mild tremors on physical exam - chronic) - anxiety - palpitations - weight loss  Pt has a FH of thyroid ds.: + son. No FH of thyroid cancer. No h/o radiation tx to head or neck. No recent contrast studies (last CT scanning with contrast was 2 months ago). No steroid use. No herbal supplements. No Biotin use.  Pt. also has a history of CAD - s/p CABG x4, HTN, CKD, HL, B12 deficiency, nephrolithiasis. He saw ophthalmology at [facility] (Dr. Johny Drilling) for the following: 1. Recurrent subtotal retinal detachment OS 2. Proliferative vitreoretinopathy OS 3. History of chronic retinal detachment OS 4. s/p retinal detachment repair with vitrectomy, membrane peel, laser and gas 06/17/17  ROS: Constitutional: + see HPI  Past Medical History:  Diagnosis Date   ACNE ROSACEA 06/27/2009   ACTINIC KERATOSIS, HEAD 04/18/2009   Acute maxillary sinusitis 05/14/2010   ALLERGIC RHINITIS 04/09/2007   Anal fissure 04/07/2016   B12 DEFICIENCY 06/07/2007   BACK PAIN WITH RADICULOPATHY 04/24/2008   Bilateral nephrolithiasis 07/07/2022   CT abd 06/2022 1. Nonobstructive bilateral nephrolithiasis measuring up to 5 mm on the right and 6 mm on the left.   Cancer [facility])    skin   CHEST WALL PAIN, ACUTE 06/15/2009   Chronic maxillary sinusitis 05/29/2008   COLITIS 04/27/2009   COLONIC POLYPS, HX OF 04/27/2009   tubular adenomas  Coronary artery disease 05/12/2014   Cath 05/12/2014 w/ severe 3-vessel CAD and preserved LV function, EF 55%   DERMATITIS, ATOPIC 10/12/2007   Diverticular disease 07/07/2022   CT abd 06/2022 . Colonic diverticulosis with no acute diverticulitis.   DIVERTICULOSIS, COLON 04/27/2009   ECCHYMOSES, SPONTANEOUS 06/27/2008   Elevated sedimentation rate 05/02/2009   ESOPHAGEAL STRICTURE 04/27/2009   GASTRITIS, CHRONIC 04/27/2009   HYPERLIPIDEMIA 03/13/2008   HYPERTENSION 04/09/2007    Hyperthyroidism    Iliac aneurysm (HCC) 07/07/2022   CT abd 06/2022 . Aneurysmal left common iliac artery (1.6 cm).     Internal bleeding hemorrhoids 01/22/2015   01/22/2015 Seen at anoscopy, grade 1 all 3 positions    Irritable bowel syndrome 08/11/2007   KIDNEY DISEASE 04/09/2007   NECK PAIN 09/14/2008   NEUROPATHY, IDIOPATHIC PERIPHERAL NEC 08/11/2007   OSTEOARTHRITIS, WRIST, RIGHT 08/05/2010   Postoperative delirium 05/20/2014   S/P CABG x 4 05/19/2014   LIMA to LAD, SVG to diag, SVG to OM, SVG to PDA, EVH via right thigh and leg   Past Surgical History:  Procedure Laterality Date   CARDIAC CATHETERIZATION     COLONOSCOPY     CORONARY ARTERY BYPASS GRAFT N/A 05/19/2014   Procedure: CORONARY ARTERY BYPASS GRAFTING (CABG);  Surgeon: Purcell Nails, MD;  Location: [facility] OR;  Service: Open Heart Surgery;  Laterality: N/A;  Times 4 using left internal mammary artery and endoscopically harvested right saphenous vein   ESOPHAGOGASTRODUODENOSCOPY     FINGER SURGERY     cut off end of finger   FLEXIBLE SIGMOIDOSCOPY     HEMORRHOID BANDING     HERNIA REPAIR     INCISION AND DRAINAGE WOUND WITH FOREIGN BODY REMOVAL Left 12/20/2013   Procedure: INCISION AND DRAINAGE LEFT INDEX FINGER;  Surgeon: Tami Ribas, MD;  Location: [facility] ORS;  Service: Orthopedics;  Laterality: Left;   INTRAOPERATIVE TRANSESOPHAGEAL ECHOCARDIOGRAM N/A 05/19/2014   Procedure: INTRAOPERATIVE TRANSESOPHAGEAL ECHOCARDIOGRAM;  Surgeon: Purcell Nails, MD;  Location: [facility] OR;  Service: Open Heart Surgery;  Laterality: N/A;   lamenectomy     LEFT HEART CATHETERIZATION WITH CORONARY ANGIOGRAM N/A 05/12/2014   Procedure: LEFT HEART CATHETERIZATION WITH CORONARY ANGIOGRAM;  Surgeon: Lesleigh Noe, MD;  Location: [facility] CATH LAB;  Service: Cardiovascular;  Laterality: N/A;   LUMBAR FUSION     TONSILLECTOMY     VARICOSE VEIN SURGERY Left    Social History   Socioeconomic History   Marital status: Married    Spouse name: Not on file    Number of children: 3   Years of education: 11   Highest education level: Not on file  Occupational History   Occupation: retired    Associate Professor: RETIRED    Comment: from [employer]  Tobacco Use   Smoking status: Former    Packs/day: 0.50    Years: 42.00    Additional pack years: 0.00    Total pack years: 21.00    Types: Cigarettes    Quit date: 11/17/1998    Years since quitting: 24.3   Smokeless tobacco: Never  Vaping Use   Vaping Use: Never used  Substance and Sexual Activity   Alcohol use: No   Drug use: No   Sexual activity: Yes  Other Topics Concern   Not on file  Social History Narrative   Married 35 years (2nd marriage). 2 kids from previous marriage (lost a 3rd child hit by car at age 54). 2 stepchildren. 6 grandkids. 2 greatgrandkids.  Retired from [employer]      Hobbies: walking/exercise, building in shop-furniture (cut finger off in February doing this)   6 grand children - Has bought them all a new car    Brother died of probably addiction   Sister also died; not sure of cause    He enjoys his life; Likes to play the keyboard; guitar    Had played in a gospel quartet    Enjoys working with wood    Right handed   Drinks caffeine   Social Determinants of Health   Financial Resource Strain: Low Risk  (10/16/2022)   Overall Financial Resource Strain (CARDIA)    Difficulty of Paying Living Expenses: Not hard at all  Food Insecurity: No Food Insecurity (10/16/2022)   Hunger Vital Sign    Worried About Running Out of Food in the Last Year: Never true    Ran Out of Food in the Last Year: Never true  Transportation Needs: No Transportation Needs (10/16/2022)   PRAPARE - Administrator, Civil Service (Medical): No    Lack of Transportation (Non-Medical): No  Physical Activity: Inactive (10/16/2022)   Exercise Vital Sign    Days of Exercise per Week: 0 days    Minutes of Exercise per Session: 0 min  Stress: No Stress Concern  Present (10/16/2022)   [employer] of Occupational Health - Occupational Stress Questionnaire    Feeling of Stress : Not at all  Social Connections: Moderately Isolated (10/16/2022)   Social Connection and Isolation Panel [NHANES]    Frequency of Communication with Friends and Family: Once a week    Frequency of Social Gatherings with Friends and Family: More than three times a week    Attends Religious Services: Never    Database administrator or Organizations: No    Attends Banker Meetings: Never    Marital Status: Married  Catering manager Violence: Not At Risk (10/16/2022)   Humiliation, Afraid, Rape, and Kick questionnaire    Fear of Current or Ex-Partner: No    Emotionally Abused: No    Physically Abused: No    Sexually Abused: No   Current Outpatient Medications on File Prior to Visit  Medication Sig Dispense Refill   aspirin 81 MG EC tablet Take 1 tablet (81 mg total) by mouth daily.     donepezil (ARICEPT) 10 MG tablet Take 1 tablet (10 mg total) by mouth daily. 90 tablet 3   FLUoxetine (PROZAC) 20 MG capsule Take 1 capsule (20 mg total) by mouth daily. 90 capsule 3   methimazole (TAPAZOLE) 5 MG tablet Take 1 tablet (5 mg total) by mouth daily. 90 tablet 3   Multiple Vitamins-Minerals (CENTRUM SILVER PO) Take 1 tablet by mouth daily.     nitrofurantoin, macrocrystal-monohydrate, (MACROBID) 100 MG capsule Take 1 capsule (100 mg total) by mouth 2 (two) times daily. 20 capsule 0   rosuvastatin (CRESTOR) 40 MG tablet Take 1 tablet (40 mg total) by mouth daily. Dose increase 01/13/22 due to heart disease and LDL goal under 70 90 tablet 3   tamsulosin (FLOMAX) 0.4 MG CAPS capsule Take 0.4 mg by mouth. Prescribed by urology     No current facility-administered medications on file prior to visit.   Allergies  Allergen Reactions   Lipitor [Atorvastatin] Other (See Comments)    REACTION: nausea and blurred vision   Trazodone And Nefazodone Other (See Comments)     dizzy   Ciprofloxacin Swelling   Mycophenolate Mofetil  Other (See Comments)    REACTION: unspecified   Amoxicillin Rash    Has patient had a PCN reaction causing immediate rash, facial/tongue/throat swelling, SOB or lightheadedness with hypotension: Unknown Has patient had a PCN reaction causing severe rash involving mucus membranes or skin necrosis: Unknown Has patient had a PCN reaction that required hospitalization: Unknown Has patient had a PCN reaction occurring within the last 10 years: Unknown If all of the above answers are "NO", then may proceed with Cephalosporin use.    Penicillins Rash    Has patient had a PCN reaction causing immediate rash, facial/tongue/throat swelling, SOB or lightheadedness with hypotension: Unknown Has patient had a PCN reaction causing severe rash involving mucus membranes or skin necrosis: Unknown Has patient had a PCN reaction that required hospitalization: Unknown Has patient had a PCN reaction occurring within the last 10 years: Unknown If all of the above answers are "NO", then may proceed with Cephalosporin use.    Rosuvastatin Other (See Comments)    Unknown    Family History  Problem Relation Age of Onset   Hernia Mother    Heart disease Father        smoker   COPD Father    Heart attack Father    Heart attack Brother    Early death Daughter    Colon cancer Neg Hx    Esophageal cancer Neg Hx    Pancreatic cancer Neg Hx    PE: BP 138/86 (BP Location: Right Arm, Patient Position: Sitting, Cuff Size: Normal)   Pulse 82   Ht  (1.803 m)   Wt 182 lb (82.6 kg)   SpO2 95%   BMI 25.38 kg/m  Wt Readings from Last 3 Encounters:  03/05/23 182 lb (82.6 kg)  02/10/23 185 lb 4 oz (84 kg)  12/31/22 184 lb 9.6 oz (83.7 kg)   Constitutional: normal weight, in NAD Eyes: no exophthalmos, no lid lag, no stare ENT: no thyromegaly, no thyroid bruits, no cervical lymphadenopathy Cardiovascular: RRR, No MRG Respiratory: CTA  B Musculoskeletal: no deformities Skin: no rashes Neurological: + mild tremor with outstretched hands  ASSESSMENT: 1.  Graves' disease - dx'ed as thyrotoxicosis in 2019, then as Graves ds. In 2023  PLAN:  1. Patient with a history of low TSH with thyrotoxic symptoms: Weight loss, heat intolerance, hyperdefecation, palpitations, anxiety, improved after starting methimazole.  He is currently on 5 mg daily.  He also takes a beta-blocker, Toprol, for coronary artery disease.  Per review of Dr. George Hugh note, he declined RAI treatment in the past. -At last visit, we discussed about possible causes for thyrotoxicosis to include Graves' disease, thyroiditis, and toxic nodules.  We checked his TSI antibodies and these were elevated, pointing towards Graves' disease as the underlying cause for his thyrotoxicosis. -We discussed about different treatments for Graves' disease to include continuing methimazole, radioactive iodine ablation goal, last resort, surgery.  Since he was doing well on low-dose methimazole, we continued the thionamide. -At today's visit, we will repeat his TFTs and see if we can decrease the methimazole dose -At today's visit he appears euthyroid.  He has no tachycardia, but has tremor with outstretched hands, which are chronic for him.  No excessive weight loss (he gained 4 pounds since last visit), heat intolerance, insomnia, anxiety. -Also, no signs of active Graves' ophthalmopathy: No double vision, blurry vision, eye pain, chemosis -I will see him back in 6 months, but possibly sooner for labs  Needs an updated prescription if we  end up changing the methimazole dose.  Component     Latest Ref Rng 03/05/2023  TSH     0.35 - 5.50 uIU/mL 7.85 (H)   T4,Free(Direct)     0.60 - 1.60 ng/dL 1.61   Triiodothyronine,Free,Serum     2.3 - 4.2 pg/mL 2.8   TSH is elevated.  Will decrease the methimazole to half a tablet daily and recheck his test in 4 weeks.  Carlus Pavlov, MD  PhD [facility] Endocrinology

## 2023-03-05 NOTE — Patient Instructions (Signed)
Please stop at the lab.  Please continue Methimazole 5 mg daily.  You should have an endocrinology follow-up appointment in 6 months. 

## 2023-03-10 ENCOUNTER — Ambulatory Visit: Payer: Medicare Other | Admitting: Physician Assistant

## 2023-03-10 ENCOUNTER — Encounter: Payer: Self-pay | Admitting: Physician Assistant

## 2023-03-10 VITALS — BP 118/69 | HR 72 | Resp 18 | Ht 71.0 in | Wt 183.0 lb

## 2023-03-10 DIAGNOSIS — F0150 Vascular dementia without behavioral disturbance: Secondary | ICD-10-CM

## 2023-03-10 DIAGNOSIS — R413 Other amnesia: Secondary | ICD-10-CM

## 2023-03-10 MED ORDER — MEMANTINE HCL 10 MG PO TABS: ORAL_TABLET | ORAL | 11 refills | Status: DC

## 2023-03-10 NOTE — Patient Instructions (Addendum)
It was a pleasure to see you today at our office.   Recommendations:  Follow up in 6 months Continue donepezil 10 mg daily.    Start Memantine 10 mg: Take 1 tablet (10 mg at night) for 2 weeks, then increase to 1 tablet (10 mg) twice a day.    Make sure to check the hearing  Continue B12 supplement    RECOMMENDATIONS FOR ALL PATIENTS WITH MEMORY PROBLEMS: 1. Continue to exercise (Recommend 30 minutes of walking everyday, or 3 hours every week) 2. Increase social interactions - continue going to [place] and enjoy social gatherings with friends and family 3. Eat healthy, avoid fried foods and eat more fruits and vegetables 4. Maintain adequate blood pressure, blood sugar, and blood cholesterol level. Reducing the risk of stroke and cardiovascular disease also helps promoting better memory. 5. Avoid stressful situations. Live a simple life and avoid aggravations. Organize your time and prepare for the next day in anticipation. 6. Sleep well, avoid any interruptions of sleep and avoid any distractions in the bedroom that may interfere with adequate sleep quality 7. Avoid sugar, avoid sweets as there is a strong link between excessive sugar intake, diabetes, and cognitive impairment We discussed the Mediterranean diet, which has been shown to help patients reduce the risk of progressive memory disorders and reduces cardiovascular risk. This includes eating fish, eat fruits and green leafy vegetables, nuts like almonds and hazelnuts, walnuts, and also use olive oil. Avoid fast foods and fried foods as much as possible. Avoid sweets and sugar as sugar use has been linked to worsening of memory function.  There is always a concern of gradual progression of memory problems. If this is the case, then we may need to adjust level of care according to patient needs. Support, both to the patient and caregiver, should then be put into place.    FALL PRECAUTIONS: Be cautious when walking. Scan the area for  obstacles that may increase the risk of trips and falls. When getting up in the mornings, sit up at the edge of the bed for a few minutes before getting out of bed. Consider elevating the bed at the head end to avoid drop of blood pressure when getting up. Walk always in a well-lit room (use night lights in the walls). Avoid area rugs or power cords from appliances in the middle of the walkways. Use a walker or a cane if necessary and consider physical therapy for balance exercise. Get your eyesight checked regularly.  FINANCIAL OVERSIGHT: Supervision, especially oversight when making financial decisions or transactions is also recommended.  HOME SAFETY: Consider the safety of the kitchen when operating appliances like stoves, microwave oven, and blender. Consider having supervision and share cooking responsibilities until no longer able to participate in those. Accidents with firearms and other hazards in the house should be identified and addressed as well.   ABILITY TO BE LEFT ALONE: If patient is unable to contact 911 operator, consider using LifeLine, or when the need is there, arrange for someone to stay with patients. Smoking is a fire hazard, consider supervision or cessation. Risk of wandering should be assessed by caregiver and if detected at any point, supervision and safe proof recommendations should be instituted.  MEDICATION SUPERVISION: Inability to self-administer medication needs to be constantly addressed. Implement a mechanism to ensure safe administration of the medications.   DRIVING: Regarding driving, in patients with progressive memory problems, driving will be impaired. We advise to have someone else do the driving  if trouble finding directions or if minor accidents are reported. Independent driving assessment is available to determine safety of driving.   If you are interested in the driving assessment, you can contact the following:  [employer] in [place]  [phone]  [facility] [phone]  [facility] [phone]  [facility] [phone] or [phone]

## 2023-03-10 NOTE — Progress Notes (Signed)
Assessment/Plan:   Dementia likely due to Alzheimer's disease and likely vascular etiology  Timothy Gallagher is a very pleasant 87 y.o. RH male with a history of Grave's disease, B12 deficiency seen today in follow up for memory loss. Patient is currently on donepezil 10 mg daily. MMSE today is 17/30, demonstrating some decline from prior in Oct 16109 at 23/30. He no longer drives but able to perform most ADLs.  He is HOH which may hinder some of his comprehension. February 2023 MRI brain personally reviewed was remarkable for mild to moderate chronic ischemic changes within the cerebral white matter, mild to moderate generalized cerebral atrophy and mild cerebellar atrophy, and subcentimeter chronic infarct within the superior right cerebellar hemisphere.       Follow up in 6  months. Continue donepezil 10 mg daily. Side effects were discussed   Start Memantine 10 mg: Take 1 tablet (10 mg at night) for 2 weeks, then increase to 1 tablet (10 mg) twice a day.   Side effects discussed Recommend good control of her cardiovascular risk factors Continue to control mood as per PCP Recommend hearing checkup  Recommend increasing activities for memory stimulation      Subjective:    This patient is accompanied in the office by his wife  who supplements the history.  Previous records as well as any outside records available were reviewed prior to todays visit. Patient was last seen on  09/04/22 at which time his MMSE was 23/30    Any changes in memory since last visit?  " memory is worse"-wife reports. He may forget recent conversations or names of people  more often, He does not like to do word finding  anymore "He does nothing, watching TV and watching the squirrels outside" . He likes going to [employer]. He shows poor insight about his memory issues.  repeats oneself?  Endorsed by his wife "all the time ".  He denies. Disoriented when walking into a room?  Patient denies  Leaving objects in  unusual places?  He may misplace his keys or other objects, but not on unusual places.  Wandering behavior?  denies   Any personality changes since last visit?  Wife endorses short of temper sometimes  Any worsening depression?:  denies   Hallucinations or paranoia?  Wife reports that he  "sees people, especially deceased family members and babies". Seizures?    denies    Any sleep changes?  He does not sleep well, because he has to get up to urinate at night at least 3 times.  "Sometimes he jumps at night. Denies vivid dreams  or sleepwalking   Sleep apnea?   denies   Any hygiene concerns?   Endorsed by wife, she needs to remind him Independent of bathing and dressing?  Endorsed  Does the patient needs help with medications?  Wife is in charge, she prepares the pillbox  Who is in charge of the finances?  Wife is in charge     Any changes in appetite?  Denies. He likes snacks    Patient have trouble swallowing?  denies   Does the patient cook?  Any kitchen accidents such as leaving the stove on? Patient denies   Any headaches?   denies   Chronic back pain  denies   Ambulates with difficulty? He has chronic arthritic pain in the knees which may limit some mobility   Recent falls or head injuries? denies     Unilateral weakness, numbness or tingling?  denies   Any tremors?  denies   Any anosmia?  Patient denies   Any incontinence of urine? Endorsed Any bowel dysfunction?   denies      Patient lives  with wife  Does the patient drive?  He no longer drives since January (surrendered the driver's license).   Initial Visit 11/27/21 The patient is seen in neurologic consultation at the request of [name], [name]  The patient is accompanied by his wife who supplements the history. This is a 87 y.o. year old RH  male who has had memory issues for about 2 years, although he denies any symptoms.  Apparently, he had been referred by PCP for evaluation in September  2021, but he never told his wife about it, and in today's visit, he adamantly refuses that he has any memory problems.  His wife reports that he is repeating the same stories and asking the same questions and this has been worse over the last 6 months.  Sometimes he does not remember where he has left objects.  He continues to drive only with his wife by his side, she denies that the patient is disoriented but she reports that he only drives short distances.  He denies having any irritability or mood changes, but his wife rolls her eyes after he states that he is "happy-go-lucky person".  Denies any depression.  He admits to not sleeping well because he has to get up around 3 times at night to urinate.  He denies any sleepwalking, REM behavior, hallucinations or paranoia.  He is independent of bathing and dressing, and his wife denies any hygiene concerns.  His medications are in a pillbox, but he forgets to take several doses.  His wife is in charge of the finances for the last 5 years.  His appetite is "too good "and denies trouble swallowing.  He does not cook.  He ambulates without difficulty, he has some chronic right leg pain due to arthritis, and is going to be seen by orthopedics soon.  He denies any falls or head injuries.  He does not use a cane to ambulate.  He denies a history of seizure, headaches, double vision, focal numbness or tingling, unilateral weakness, tremors or anosmia.  He has been evaluated for dizziness in view of orthostatic hypotension. He denies any constipation or diarrhea.  He denies a history of sleep apnea, alcohol or tobacco.  Family history negative for Alzheimer's disease.  He has high School education.  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  Last CT of the head on 12/31/2019 showed chronic small vessel ischemic changes.   Labs  A1c 6.4 TSH 0.24 with free T4 0.84, LDL normal at 75,  chemistries normal, B12 normal.   MRI of the brain 12/19/21 No evidence of acute intracranial abnormality.3. Mild-to-moderate chronic small-vessel ischemic changes within the cerebral white matter.4. Subcentimeter chronic infarct within the superior right cerebellar hemisphere.5. Mild-to-moderate generalized cerebral atrophy.6. Comparatively mild cerebellar atrophy.   PREVIOUS MEDICATIONS:   CURRENT MEDICATIONS:  Outpatient Encounter Medications as of 03/10/2023  Medication Sig   memantine (NAMENDA) 10 MG tablet Take 1 tablet (10 mg at night) for 2 weeks, then increase to 1 tablet (10 mg) twice a day   aspirin 81 MG EC tablet Take 1 tablet (81 mg total) by mouth daily.   donepezil (ARICEPT) 10 MG tablet Take 1  tablet (10 mg total) by mouth daily.   FLUoxetine (PROZAC) 20 MG capsule Take 1 capsule (20 mg total) by mouth daily.   methimazole (TAPAZOLE) 5 MG tablet Take 0.5 tablets (2.5 mg total) by mouth daily.   Multiple Vitamins-Minerals (CENTRUM SILVER PO) Take 1 tablet by mouth daily.   nitrofurantoin, macrocrystal-monohydrate, (MACROBID) 100 MG capsule Take 1 capsule (100 mg total) by mouth 2 (two) times daily.   rosuvastatin (CRESTOR) 40 MG tablet Take 1 tablet (40 mg total) by mouth daily. Dose increase 01/13/22 due to heart disease and LDL goal under 70   tamsulosin (FLOMAX) 0.4 MG CAPS capsule Take 0.4 mg by mouth. Prescribed by urology   No facility-administered encounter medications on file as of 03/10/2023.       03/10/2023    9:00 AM 09/04/2022    9:00 AM 11/27/2021    9:24 AM  MMSE - Mini Mental State Exam  Orientation to time 2 3 2   Orientation to Place 3 5 4   Registration 3 3 3   Attention/ Calculation 0 5 3  Recall 1 0 0  Language- name 2 objects 2 2 2   Language- repeat 1 1 1   Language- follow 3 step command 3 3 3   Language- read & follow direction 1 1 1   Write a sentence 1 0 1  Copy design 0 0 0  Total score 17 23 20        No data to display          Objective:      PHYSICAL EXAMINATION:    VITALS:   Vitals:   03/10/23 0842  BP: 118/69  Pulse: 72  Resp: 18  SpO2: 95%  Weight: 183 lb (83 kg)  Height: 5\' 11"  (1.803 m)    GEN:  The patient appears stated age and is in NAD. HEENT:  Normocephalic, atraumatic.   Neurological examination:  General: NAD, well-groomed, appears stated age. Orientation: The patient is alert. Oriented to person, not to place, or date. Knows city and state Cranial nerves: There is good facial symmetry.The speech is fluent and clear. No aphasia or dysarthria. Fund of knowledge is reduced. Recent and remote memory are impaired. Attention and concentration are reduced.  Able to name objects and repeat phrases.  Hearing is reduced to conversational tone.   Sensation: Sensation is intact to light touch throughout Motor: Strength is at least antigravity x4. DTR's 2/4 in UE/LE     Movement examination: Tone: There is normal tone in the UE/LE Abnormal movements:  no tremor.  No myoclonus.  No asterixis.   Coordination:  There is no decremation with RAM's. Normal finger to nose  Gait and Station: The patient has no difficulty arising out of a deep-seated chair without the use of the hands. The patient's stride length is good.  Gait is cautious and narrow.    Thank you for allowing [place] the opportunity to participate in the care of this nice patient. Please do not hesitate to contact us for any questions or concerns.   Total time spent on today's visit was 30 minutes dedicated to this patient today, preparing to see patient, examining the patient, ordering tests and/or medications and counseling the patient, documenting clinical information in the EHR or other health record, independently interpreting results and communicating results to the patient/family, discussing treatment and goals, answering patient's questions and coordinating care.  Cc:  [name], MD  Marlowe Kays 03/10/2023 9:28 AM

## 2023-03-17 ENCOUNTER — Ambulatory Visit (INDEPENDENT_AMBULATORY_CARE_PROVIDER_SITE_OTHER): Payer: Medicare Other

## 2023-03-17 DIAGNOSIS — E538 Deficiency of other specified B group vitamins: Secondary | ICD-10-CM

## 2023-03-17 MED ORDER — CYANOCOBALAMIN 1000 MCG/ML IJ SOLN
1000.0000 ug | Freq: Once | INTRAMUSCULAR | Status: AC
Start: 2023-03-17 — End: 2023-03-17
  Administered 2023-03-17: 1000 ug via INTRAMUSCULAR

## 2023-03-17 NOTE — Progress Notes (Signed)
Timothy Gallagher 87 yr old male presents to office today for monthly B12 injection per Tana Conch, MD. Administered CYANOCOBALAMIN 1,000 mcg IM left arm. Patient tolerated well.

## 2023-03-28 ENCOUNTER — Encounter (HOSPITAL_COMMUNITY): Payer: Self-pay | Admitting: Emergency Medicine

## 2023-03-28 ENCOUNTER — Ambulatory Visit (HOSPITAL_COMMUNITY)
Admission: EM | Admit: 2023-03-28 | Discharge: 2023-03-28 | Disposition: A | Discharge status: Home / Self Care | Payer: Medicare Other | Attending: Emergency Medicine | Admitting: Emergency Medicine

## 2023-03-28 DIAGNOSIS — R519 Headache, unspecified: Secondary | ICD-10-CM | POA: Diagnosis not present

## 2023-03-28 DIAGNOSIS — R220 Localized swelling, mass and lump, head: Secondary | ICD-10-CM | POA: Diagnosis not present

## 2023-03-28 MED ORDER — CLINDAMYCIN HCL 300 MG PO CAPS: 300.0000 mg | ORAL_CAPSULE | Freq: Three times a day (TID) | ORAL | 0 refills | Status: AC

## 2023-03-28 NOTE — Discharge Instructions (Signed)
Based on your exam there are no abnormalities to the teeth but I do have concerns of infection to your jaw and therefore will begin antibiotics  Take clindamycin every 8 hours for the next 7 days, if you begin to have diarrhea it is related to this medicine and you may use over-the-counter Imodium as needed to help slow this down  You may continue use of over-the-counter pain medicine as it has been helpful  You may use ice and heat over the affected area in 10 to 15-minute intervals  Please follow-up with his urgent care or your primary doctor if any of your symptoms continue to persist past use of the medicine

## 2023-03-28 NOTE — ED Provider Notes (Signed)
[facility]    CSN: [id number] Arrival date & time: 03/28/23  1630      History   Chief Complaint Chief Complaint  Patient presents with   Dental Pain    HPI Timothy Gallagher is a 87 y.o. male.   Presents for evaluation of left-sided jaw pain beginning 1 day ago, began to experience swelling and chills this morning upon awakening.  Attempted use of over-the-counter NSAIDs which has been helpful in managing swelling and pain.  Denies need for dental work, difficulty or trouble swallowing or difficulty breathing.  Symptoms have not occurred before.  Denies fever.   Past Medical History:  Diagnosis Date   ACNE ROSACEA 06/27/2009   ACTINIC KERATOSIS, HEAD 04/18/2009   Acute maxillary sinusitis 05/14/2010   ALLERGIC RHINITIS 04/09/2007   Anal fissure 04/07/2016   B12 DEFICIENCY 06/07/2007   BACK PAIN WITH RADICULOPATHY 04/24/2008   Bilateral nephrolithiasis 07/07/2022   CT abd 06/2022 1. Nonobstructive bilateral nephrolithiasis measuring up to 5 mm on the right and 6 mm on the left.   Cancer [facility])    skin   CHEST WALL PAIN, ACUTE 06/15/2009   Chronic maxillary sinusitis 05/29/2008   COLITIS 04/27/2009   COLONIC POLYPS, HX OF 04/27/2009   tubular adenomas   Coronary artery disease 05/12/2014   Cath 05/12/2014 w/ severe 3-vessel CAD and preserved LV function, EF 55%   DERMATITIS, ATOPIC 10/12/2007   Diverticular disease 07/07/2022   CT abd 06/2022 . Colonic diverticulosis with no acute diverticulitis.   DIVERTICULOSIS, COLON 04/27/2009   ECCHYMOSES, SPONTANEOUS 06/27/2008   Elevated sedimentation rate 05/02/2009   ESOPHAGEAL STRICTURE 04/27/2009   GASTRITIS, CHRONIC 04/27/2009   HYPERLIPIDEMIA 03/13/2008   HYPERTENSION 04/09/2007   Hyperthyroidism    Iliac aneurysm (HCC) 07/07/2022   CT abd 06/2022 . Aneurysmal left common iliac artery (1.6 cm).     Internal bleeding hemorrhoids 01/22/2015   01/22/2015 Seen at anoscopy, grade 1 all 3 positions    Irritable bowel  syndrome 08/11/2007   KIDNEY DISEASE 04/09/2007   NECK PAIN 09/14/2008   NEUROPATHY, IDIOPATHIC PERIPHERAL NEC 08/11/2007   OSTEOARTHRITIS, WRIST, RIGHT 08/05/2010   Postoperative delirium 05/20/2014   S/P CABG x 4 05/19/2014   LIMA to LAD, SVG to diag, SVG to OM, SVG to PDA, EVH via right thigh and leg    Patient Active Problem List   Diagnosis Date Noted   Graves disease 03/05/2023   Aortic atherosclerosis (HCC) 01/05/2023   Hyperglycemia 07/22/2022   Bilateral nephrolithiasis 07/07/2022   Diverticular disease 07/07/2022   Iliac aneurysm (HCC) 07/07/2022   Enlarged prostate 07/07/2022   Mixed vascular and neurodegenerative dementia without behavioral disturbance (HCC) 11/28/2021   Abdominal aortic aneurysm (AAA) without rupture, unspecified part (HCC) 11/27/2021   Chronic obstructive pulmonary disease (HCC) 08/26/2019   History of atrial fibrillation 08/26/2019   Degeneration of lumbar intervertebral disc 10/21/2018   Hyperthyroidism 01/13/2018   Detached retina, left 06/30/2017   AAA (abdominal aortic aneurysm) (HCC) 05/12/2017   Left sided abdominal pain 04/15/2017   Low back pain 02/06/2017   BPPV (benign paroxysmal positional vertigo) 09/01/2016   Anal fissure 04/07/2016   Internal and external bleeding hemorrhoids 01/22/2015   Insomnia 11/22/2014   Anemia 07/20/2014   CAD (coronary artery disease) of artery bypass graft 05/19/2014   BPH (benign prostatic hyperplasia) 12/05/2013   Palpitations 02/11/2013   OSTEOARTHRITIS, WRIST, RIGHT 08/05/2010   ACNE ROSACEA 06/27/2009   ESOPHAGEAL STRICTURE 04/27/2009   ACTINIC KERATOSIS, HEAD 04/18/2009  NECK PAIN 09/14/2008   Hyperlipidemia 03/13/2008   NEUROPATHY, IDIOPATHIC PERIPHERAL NEC 08/11/2007   Irritable bowel syndrome 08/11/2007   B12 deficiency 06/07/2007   Essential hypertension 04/09/2007   ALLERGIC RHINITIS 04/09/2007    Past Surgical History:  Procedure Laterality Date   CARDIAC CATHETERIZATION      COLONOSCOPY     CORONARY ARTERY BYPASS GRAFT N/A 05/19/2014   Procedure: CORONARY ARTERY BYPASS GRAFTING (CABG);  Surgeon: Purcell Nails, MD;  Location: [facility] OR;  Service: Open Heart Surgery;  Laterality: N/A;  Times 4 using left internal mammary artery and endoscopically harvested right saphenous vein   ESOPHAGOGASTRODUODENOSCOPY     FINGER SURGERY     cut off end of finger   FLEXIBLE SIGMOIDOSCOPY     HEMORRHOID BANDING     HERNIA REPAIR     INCISION AND DRAINAGE WOUND WITH FOREIGN BODY REMOVAL Left 12/20/2013   Procedure: INCISION AND DRAINAGE LEFT INDEX FINGER;  Surgeon: Tami Ribas, MD;  Location: [facility] ORS;  Service: Orthopedics;  Laterality: Left;   INTRAOPERATIVE TRANSESOPHAGEAL ECHOCARDIOGRAM N/A 05/19/2014   Procedure: INTRAOPERATIVE TRANSESOPHAGEAL ECHOCARDIOGRAM;  Surgeon: Purcell Nails, MD;  Location: [facility] OR;  Service: Open Heart Surgery;  Laterality: N/A;   lamenectomy     LEFT HEART CATHETERIZATION WITH CORONARY ANGIOGRAM N/A 05/12/2014   Procedure: LEFT HEART CATHETERIZATION WITH CORONARY ANGIOGRAM;  Surgeon: Lesleigh Noe, MD;  Location: [facility] CATH LAB;  Service: Cardiovascular;  Laterality: N/A;   LUMBAR FUSION     TONSILLECTOMY     VARICOSE VEIN SURGERY Left        Home Medications    Prior to Admission medications   Medication Sig Start Date End Date Taking? Authorizing Provider  aspirin 81 MG EC tablet Take 1 tablet (81 mg total) by mouth daily. 06/19/15   Lyn Records, MD  donepezil (ARICEPT) 10 MG tablet Take 1 tablet (10 mg total) by mouth daily. 09/04/22   Marcos Eke, PA-C  FLUoxetine (PROZAC) 20 MG capsule Take 1 capsule (20 mg total) by mouth daily. 11/28/22   [name], MD  memantine (NAMENDA) 10 MG tablet Take 1 tablet (10 mg at night) for 2 weeks, then increase to 1 tablet (10 mg) twice a day 03/10/23   Marcos Eke, PA-C  methimazole (TAPAZOLE) 5 MG tablet Take 0.5 tablets (2.5 mg total) by mouth daily. 03/05/23   Carlus Pavlov, MD  Multiple  Vitamins-Minerals (CENTRUM SILVER PO) Take 1 tablet by mouth daily.    [provider]  nitrofurantoin, macrocrystal-monohydrate, (MACROBID) 100 MG capsule Take 1 capsule (100 mg total) by mouth 2 (two) times daily. 12/22/22   [name], MD  rosuvastatin (CRESTOR) 40 MG tablet Take 1 tablet (40 mg total) by mouth daily. Dose increase 01/13/22 due to heart disease and LDL goal under 70 01/13/22   [name], MD  tamsulosin (FLOMAX) 0.4 MG CAPS capsule Take 0.4 mg by mouth. Prescribed by urology    [provider]    Family History Family History  Problem Relation Age of Onset   Hernia Mother    Heart disease Father        smoker   COPD Father    Heart attack Father    Heart attack Brother    Early death Daughter    Colon cancer Neg Hx    Esophageal cancer Neg Hx    Pancreatic cancer Neg Hx     Social History Social History   Tobacco  Use   Smoking status: Former    Packs/day: 0.50    Years: 42.00    Additional pack years: 0.00    Total pack years: 21.00    Types: Cigarettes    Quit date: 11/17/1998    Years since quitting: 24.3   Smokeless tobacco: Never  Vaping Use   Vaping Use: Never used  Substance Use Topics   Alcohol use: No   Drug use: No     Allergies   Lipitor [atorvastatin], Trazodone and nefazodone, Ciprofloxacin, Mycophenolate mofetil, Amoxicillin, Penicillins, and Rosuvastatin   Review of Systems Review of Systems   Physical Exam Triage Vital Signs ED Triage Vitals [03/28/23 1702]  Enc Vitals Group     BP 124/67     Pulse Rate 79     Resp 16     Temp 99.5 F (37.5 C)     Temp src      SpO2 95 %     Weight      Height      Head Circumference      Peak Flow      Pain Score 6     Pain Loc      Pain Edu?      Excl. in GC?    No data found.  Updated Vital Signs BP 124/67 (BP Location: Left Arm)   Pulse 79   Temp 99.5 F (37.5 C)   Resp 16   SpO2 95%   Visual Acuity Right Eye Distance:   Left Eye  Distance:   Bilateral Distance:    Right Eye Near:   Left Eye Near:    Bilateral Near:     Physical Exam Constitutional:      Appearance: Normal appearance.  HENT:     Head:     Comments: Mild to moderate left jaw swelling at the close of the mandible, tenderness noted, no ecchymosis or deformity, able to complete range of motion of the jaw, no abnormalities to the oral cavity, cervical adenopathy present to the left, no abnormality to the ear Eyes:     Extraocular Movements: Extraocular movements intact.  Pulmonary:     Effort: Pulmonary effort is normal.  Neurological:     Mental Status: He is alert and oriented to person, place, and time. Mental status is at baseline.      UC Treatments / Results  Labs (all labs ordered are listed, but only abnormal results are displayed) Labs Reviewed - No data to display  EKG   Radiology No results found.  Procedures Procedures (including critical care time)  Medications Ordered in UC Medications - No data to display  Initial Impression / Assessment and Plan / UC Course  I have reviewed the triage vital signs and the nursing notes.  Pertinent labs & imaging results that were available during my care of the patient were reviewed by me and considered in my medical decision making (see chart for details).  Left facial pain, left facial swelling  Unknown cause but presentation and symptomology is consistent with infection therefore we will provide bacterial coverage, clindamycin sent to pharmacy, discussed side effects and use of Imodium and increase fluid intake if diarrhea occurs, recommended continued supportive care through use of over-the-counter analgesics, may also use heat or ice, may continue diet as tolerated, advise follow-up with urgent care or primary doctor if symptoms persist past use of medication Final Clinical Impressions(s) / UC Diagnoses   Final diagnoses:  None   Discharge Instructions  None    ED  Prescriptions   None    PDMP not reviewed this encounter.   Valinda Hoar, NP 03/28/23 1731

## 2023-03-28 NOTE — ED Triage Notes (Signed)
Left sided jaw swelling and pain beginning yesterday and worsening into today. Unsure of whether it's dental related

## 2023-04-07 ENCOUNTER — Other Ambulatory Visit: Payer: Self-pay

## 2023-04-07 MED ORDER — ROSUVASTATIN CALCIUM 40 MG PO TABS: 40.0000 mg | ORAL_TABLET | Freq: Every day | ORAL | 3 refills | Status: DC

## 2023-04-09 ENCOUNTER — Ambulatory Visit (INDEPENDENT_AMBULATORY_CARE_PROVIDER_SITE_OTHER): Payer: Medicare Other

## 2023-04-09 DIAGNOSIS — E538 Deficiency of other specified B group vitamins: Secondary | ICD-10-CM

## 2023-04-09 MED ORDER — CYANOCOBALAMIN 1000 MCG/ML IJ SOLN
1000.0000 ug | Freq: Once | INTRAMUSCULAR | Status: AC
Start: 2023-04-09 — End: 2023-04-09
  Administered 2023-04-09: 1000 ug via INTRAMUSCULAR

## 2023-04-09 NOTE — Progress Notes (Signed)
Pt here for B12 injection for Dr. Hunter. Injection given in right deltoid. Pt tolerated well.   

## 2023-05-04 DIAGNOSIS — F02B4 Dementia in other diseases classified elsewhere, moderate, with anxiety: Secondary | ICD-10-CM | POA: Diagnosis not present

## 2023-05-12 ENCOUNTER — Telehealth: Payer: Self-pay | Admitting: Physician Assistant

## 2023-05-12 NOTE — Telephone Encounter (Signed)
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  Spouse is looking to see for the future.  She would like to have a call back.

## 2023-05-12 NOTE — Telephone Encounter (Signed)
Pt wife wants to know what level of Dementia patient has

## 2023-05-13 ENCOUNTER — Telehealth: Payer: Self-pay | Admitting: Physician Assistant

## 2023-05-13 NOTE — Telephone Encounter (Signed)
Timothy Gallagher isn't available, will call back.

## 2023-05-13 NOTE — Telephone Encounter (Signed)
Wife advised, thanked me for calling.

## 2023-05-13 NOTE — Telephone Encounter (Signed)
Unable to give information to Daughter until release is signed. Did speak with POA [name], we will be happy to fill out whatever is needed per Marlowe Kays, PA-C.

## 2023-05-13 NOTE — Telephone Encounter (Signed)
Please call patient daughter needs some information about what level of the dementia is if they want to get him to a place to help with him

## 2023-05-19 ENCOUNTER — Ambulatory Visit (INDEPENDENT_AMBULATORY_CARE_PROVIDER_SITE_OTHER): Payer: Medicare Other

## 2023-05-19 DIAGNOSIS — E538 Deficiency of other specified B group vitamins: Secondary | ICD-10-CM | POA: Diagnosis not present

## 2023-05-19 MED ORDER — CYANOCOBALAMIN 1000 MCG/ML IJ SOLN
1000.0000 ug | Freq: Once | INTRAMUSCULAR | Status: AC
Start: 2023-05-19 — End: 2023-05-19
  Administered 2023-05-19: 1000 ug via INTRAMUSCULAR

## 2023-05-19 NOTE — Progress Notes (Signed)
Pt tolerated b12 injecetion well.

## 2023-06-01 ENCOUNTER — Ambulatory Visit: Payer: Medicare Other | Admitting: Family Medicine

## 2023-06-18 ENCOUNTER — Ambulatory Visit (INDEPENDENT_AMBULATORY_CARE_PROVIDER_SITE_OTHER): Payer: Medicare Other

## 2023-06-18 DIAGNOSIS — E538 Deficiency of other specified B group vitamins: Secondary | ICD-10-CM

## 2023-06-18 MED ORDER — CYANOCOBALAMIN 1000 MCG/ML IJ SOLN
1000.0000 ug | Freq: Once | INTRAMUSCULAR | Status: AC
Start: 2023-06-18 — End: 2023-06-18
  Administered 2023-06-18: 1000 ug via INTRAMUSCULAR

## 2023-06-18 NOTE — Progress Notes (Signed)
Pt tolerated b12 injection well. 

## 2023-07-02 ENCOUNTER — Ambulatory Visit (INDEPENDENT_AMBULATORY_CARE_PROVIDER_SITE_OTHER): Payer: Medicare Other | Admitting: Family Medicine

## 2023-07-02 ENCOUNTER — Encounter: Payer: Self-pay | Admitting: Family Medicine

## 2023-07-02 VITALS — BP 122/68 | HR 80 | Temp 97.5°F | Ht 71.0 in | Wt 184.8 lb

## 2023-07-02 DIAGNOSIS — E785 Hyperlipidemia, unspecified: Secondary | ICD-10-CM | POA: Diagnosis not present

## 2023-07-02 DIAGNOSIS — I714 Abdominal aortic aneurysm, without rupture, unspecified: Secondary | ICD-10-CM

## 2023-07-02 DIAGNOSIS — R739 Hyperglycemia, unspecified: Secondary | ICD-10-CM

## 2023-07-02 DIAGNOSIS — E538 Deficiency of other specified B group vitamins: Secondary | ICD-10-CM

## 2023-07-02 DIAGNOSIS — E0590 Thyrotoxicosis, unspecified without thyrotoxic crisis or storm: Secondary | ICD-10-CM | POA: Diagnosis not present

## 2023-07-02 DIAGNOSIS — J449 Chronic obstructive pulmonary disease, unspecified: Secondary | ICD-10-CM

## 2023-07-02 DIAGNOSIS — I25810 Atherosclerosis of coronary artery bypass graft(s) without angina pectoris: Secondary | ICD-10-CM | POA: Diagnosis not present

## 2023-07-02 DIAGNOSIS — I10 Essential (primary) hypertension: Secondary | ICD-10-CM

## 2023-07-02 LAB — LIPID PANEL
Cholesterol: 104 mg/dL (ref 0–200)
HDL: 44.8 mg/dL (ref >39.00)
LDL Cholesterol: 35 mg/dL (ref 0–99)
NonHDL: 58.78
Total CHOL/HDL Ratio: 2
Triglycerides: 121 mg/dL (ref 0.0–149.0)
VLDL: 24.2 mg/dL (ref 0.0–40.0)

## 2023-07-02 LAB — COMPREHENSIVE METABOLIC PANEL
ALT: 17 U/L (ref 0–53)
AST: 22 U/L (ref 0–37)
Albumin: 3.8 g/dL (ref 3.5–5.2)
Alkaline Phosphatase: 69 U/L (ref 39–117)
BUN: 16 mg/dL (ref 6–23)
CO2: 29 mEq/L (ref 19–32)
Calcium: 9 mg/dL (ref 8.4–10.5)
Chloride: 104 mEq/L (ref 96–112)
Creatinine, Ser: 0.97 mg/dL (ref 0.40–1.50)
GFR: 71.44 mL/min (ref >60.00)
Glucose, Bld: 86 mg/dL (ref 70–99)
Potassium: 4.6 mEq/L (ref 3.5–5.1)
Sodium: 140 mEq/L (ref 135–145)
Total Bilirubin: 0.4 mg/dL (ref 0.2–1.2)
Total Protein: 6.5 g/dL (ref 6.0–8.3)

## 2023-07-02 LAB — CBC WITH DIFFERENTIAL/PLATELET
Basophils Absolute: 0 10*3/uL (ref 0.0–0.1)
Basophils Relative: 0.4 % (ref 0.0–3.0)
Eosinophils Absolute: 0.2 10*3/uL (ref 0.0–0.7)
Eosinophils Relative: 3 % (ref 0.0–5.0)
HCT: 39.7 % (ref 39.0–52.0)
Hemoglobin: 12.6 g/dL — ABNORMAL LOW (ref 13.0–17.0)
Lymphocytes Relative: 30.4 % (ref 12.0–46.0)
Lymphs Abs: 1.8 10*3/uL (ref 0.7–4.0)
MCHC: 31.8 g/dL (ref 30.0–36.0)
MCV: 87 fl (ref 78.0–100.0)
Monocytes Absolute: 0.6 10*3/uL (ref 0.1–1.0)
Monocytes Relative: 9.7 % (ref 3.0–12.0)
Neutro Abs: 3.4 10*3/uL (ref 1.4–7.7)
Neutrophils Relative %: 56.5 % (ref 43.0–77.0)
Platelets: 163 10*3/uL (ref 150.0–400.0)
RBC: 4.56 Mil/uL (ref 4.22–5.81)
RDW: 16 % — ABNORMAL HIGH (ref 11.5–15.5)
WBC: 6 10*3/uL (ref 4.0–10.5)

## 2023-07-02 LAB — T4, FREE: Free T4: 0.73 ng/dL (ref 0.60–1.60)

## 2023-07-02 LAB — TSH: TSH: 2.83 u[IU]/mL (ref 0.35–5.50)

## 2023-07-02 LAB — VITAMIN B12: Vitamin B-12: 333 pg/mL (ref 211–911)

## 2023-07-02 LAB — T3, FREE: T3, Free: 2.9 pg/mL (ref 2.3–4.2)

## 2023-07-02 NOTE — Progress Notes (Signed)
Phone [phone] In person visit   Subjective:   Timothy Gallagher is a 87 y.o. year old very pleasant male patient who presents for/with See problem oriented charting Chief Complaint  Patient presents with   Hypertension    Past Medical History-  Patient Active Problem List   Diagnosis Date Noted   Mixed vascular and neurodegenerative dementia without behavioral disturbance (HCC) 11/28/2021    Priority: High   Hyperthyroidism 01/13/2018    Priority: High   Detached retina, left 06/30/2017    Priority: High   AAA (abdominal aortic aneurysm) (HCC) 05/12/2017    Priority: High   CAD (coronary artery disease) of artery bypass graft 05/19/2014    Priority: High   Aortic atherosclerosis (HCC) 01/05/2023    Priority: Medium    Hyperglycemia 07/22/2022    Priority: Medium    Iliac aneurysm (HCC) 07/07/2022    Priority: Medium    Abdominal aortic aneurysm (AAA) without rupture, unspecified part (HCC) 11/27/2021    Priority: Medium    History of atrial fibrillation 08/26/2019    Priority: Medium    BPPV (benign paroxysmal positional vertigo) 09/01/2016    Priority: Medium    Insomnia 11/22/2014    Priority: Medium    Anemia 07/20/2014    Priority: Medium    BPH (benign prostatic hyperplasia) 12/05/2013    Priority: Medium    Hyperlipidemia 03/13/2008    Priority: Medium    B12 deficiency 06/07/2007    Priority: Medium    Essential hypertension 04/09/2007    Priority: Medium    Diverticular disease 07/07/2022    Priority: Low   Chronic obstructive pulmonary disease (HCC) 08/26/2019    Priority: Low   Left sided abdominal pain 04/15/2017    Priority: Low   Low back pain 02/06/2017    Priority: Low   Anal fissure 04/07/2016    Priority: Low   Internal and external bleeding hemorrhoids 01/22/2015    Priority: Low   Palpitations 02/11/2013    Priority: Low   OSTEOARTHRITIS, WRIST, RIGHT 08/05/2010    Priority: Low   ACNE ROSACEA 06/27/2009    Priority: Low    ESOPHAGEAL STRICTURE 04/27/2009    Priority: Low   ACTINIC KERATOSIS, HEAD 04/18/2009    Priority: Low   NECK PAIN 09/14/2008    Priority: Low   NEUROPATHY, IDIOPATHIC PERIPHERAL NEC 08/11/2007    Priority: Low   Irritable bowel syndrome 08/11/2007    Priority: Low   ALLERGIC RHINITIS 04/09/2007    Priority: Low   Graves disease 03/05/2023   Bilateral nephrolithiasis 07/07/2022   Enlarged prostate 07/07/2022   Degeneration of lumbar intervertebral disc 10/21/2018    Medications- reviewed and updated Current Outpatient Medications  Medication Sig Dispense Refill   aspirin 81 MG EC tablet Take 1 tablet (81 mg total) by mouth daily.     donepezil (ARICEPT) 10 MG tablet Take 1 tablet (10 mg total) by mouth daily. 90 tablet 3   FLUoxetine (PROZAC) 20 MG capsule Take 1 capsule (20 mg total) by mouth daily. 90 capsule 3   memantine (NAMENDA) 10 MG tablet Take 1 tablet (10 mg at night) for 2 weeks, then increase to 1 tablet (10 mg) twice a day 60 tablet 11   methimazole (TAPAZOLE) 5 MG tablet Take 0.5 tablets (2.5 mg total) by mouth daily. 45 tablet 3   Multiple Vitamins-Minerals (CENTRUM SILVER PO) Take 1 tablet by mouth daily.     rosuvastatin (CRESTOR) 40 MG tablet Take 1 tablet (40 mg total)  by mouth daily. Dose increase 01/13/22 due to heart disease and LDL goal under 70 90 tablet 3   tamsulosin (FLOMAX) 0.4 MG CAPS capsule Take 0.4 mg by mouth. Prescribed by urology     No current facility-administered medications for this visit.     Objective:  BP 122/68   Pulse 80   Temp (!) 97.5 F (36.4 C)   Ht 5\' 11"  (1.803 m)   Wt 184 lb 12.8 oz (83.8 kg)   SpO2 95%   BMI 25.77 kg/m  Gen: NAD, resting comfortably CV: RRR no murmurs rubs or gallops Lungs: CTAB no crackles, wheeze, rhonchi Abdomen: soft/nontender/nondistended/normal bowel sounds. No rebound or guarding.  Ext: no edema Skin: warm, dry Neuro: looks to wife for many answers    Assessment and Plan   #Dementia likely  due to mixed vascular and Alzheimer's dementia-follows with Dr. Brent Bulla, PA #irritability- trial zoloft 25 mg starting 03/17/22 (had nausea)- later changed to fluoxetine 10 mg (no issues with nausea) and later increased to 20 mg S: Medication: Donepezil 10 mg- ongoing memory issues- also on memantine 10 mg BID -Most recent MRI 12/19/2021 prior cerebellar stroke  -has had irritability issues- phq9 of 0 today- still has issues even on medicine  A/P: dementia ongoing- continue current medications  - irritability imperfect but reasonable control- continue current medications - considered rexulti     #CAD- with history of CABG in 2015 - follows with Dr. Verdis Prime #hyperlipidemia - LDL goal under 70 S: Medication: rosuvastatin 40 mg, aspirin 81 mg   -no chest pain or shortness of breath reported- occasional right sided chest soreness- takes tylenol for this Lab Results  Component Value Date   CHOL 107 07/22/2022   HDL 41.90 07/22/2022   LDLCALC 48 07/22/2022   LDLDIRECT 75.0 11/27/2021   TRIG 85.0 07/22/2022   CHOLHDL 3 07/22/2022   A/P: CAD asymptomatic - I do not think that the right sided chest pain is heart related-but still encouraged cardiology follow up for their opinion- continue current medications for now  - update cholesterol today  %hyperthyroidism- follows with Dr. Elvera Lennox S: compliant On thyroid medication- methimazole 2.5 mg BID A/P:stable - continue current medications and check TSH, t3, t4   #hypertension/orthostatic hypotension S: medication:  none -orthostatic intolerance on ARB and beta blocker in past- and BP has trended down. Still with some orthostatic issues on tamsulosin- ongoing dizziness issues A/P: blood pressure well controlled continue without medication(s) - still with orthostatic symptoms- could loosen salt in diet some and stay well hydrated -tamsulosin increases  # AAA S:07/03/22 ct- 3. Stable suprarenal abdominal aorta aneurysm (3.1 cm) and  interval increase in size of an infrarenal abdominal aorta aneurysm (3.7 cm.) Recommend follow-up ultrasound every 2 years. This recommendation follows ACR consensus guidelines: White Paper of the ACR Incidental Findings Committee II on Vascular Findings. J Am Coll Radiol 2013; 10:789-794.  A/P: repeat due 2025- continue to monitor - doubt right sided chest pain related to this   # BPH S:history of severe UTI leading to hospitalization. Orthostatic issues but feel we have to leave him on flomax  A/P: stable- continue current medicines    # B12 deficiency S: Current treatment/medication (oral vs. IM):  injections montly- just had 06/18/23 Lab Results  Component Value Date   VITAMINB12 >1500 (H) 07/22/2022  A/P: hopefully stable- update b12 today. Continue current meds for now    # Hyperglycemia/insulin resistance/prediabetes- peak a1c of 6.5 S:  Medication: none Exercise  and diet- rather sedentary. Rather thirsty Lab Results  Component Value Date   HGBA1C 6.5 07/22/2022   HGBA1C 6.4 11/27/2021   HGBA1C 6.4 05/16/2021   A/P: if a1c 6.5 or above- will diagnose diabetes   #copd- on imaging in past- no symptoms- monitor- glad he is not smoking  Recommended follow up: Return in about 6 months (around 01/02/2024) for physical or sooner if needed.Schedule b4 you leave. Future Appointments  Date Time Provider Department Center  09/04/2023 10:20 AM Carlus Pavlov, MD [id number] None  09/09/2023  9:30 AM Marcos Eke, PA-C [facility] None    Lab/Order associations: not fasting   ICD-10-CM   1. Coronary artery disease involving coronary bypass graft of native heart without angina pectoris  I25.810     2. Essential hypertension  I10 Comprehensive metabolic panel    CBC with Differential/Platelet    Lipid panel    3. Hyperlipidemia, unspecified hyperlipidemia type  E78.5 Comprehensive metabolic panel    CBC with Differential/Platelet    Lipid panel    4. Hyperglycemia   R73.9     5. Chronic obstructive pulmonary disease, unspecified COPD type (HCC) Chronic J44.9     6. Abdominal aortic aneurysm (AAA) without rupture, unspecified part (HCC) Chronic I71.40     7. B12 deficiency  E53.8 Vitamin B12    8. Hyperthyroidism  E05.90 TSH    T4, free    T3, free      No orders of the defined types were placed in this encounter.   Return precautions advised.  Tana Conch, MD

## 2023-07-02 NOTE — Patient Instructions (Addendum)
Let us know if you get a flu or COVID vaccine this fall.  Go ahead and call cardiology for follow up - last October was last visit- mention right sided chest pain   Please stop by lab before you go If you have mychart- we will send your results within 3 business days of [place] receiving them.  If you do not have mychart- we will call you about results within 5 business days of [place] receiving them.  *please also note that you will see labs on mychart as soon as they post. I will later go in and write notes on them- will say "notes from Dr. Durene Cal"   Recommended follow up: Return in about 6 months (around 01/02/2024) for physical or sooner if needed.Schedule b4 you leave.

## 2023-07-02 NOTE — Addendum Note (Signed)
Addended by: [name] on: 07/02/2023 06:24 PM   Modules accepted: Level of Service

## 2023-07-21 ENCOUNTER — Ambulatory Visit: Payer: Medicare Other

## 2023-07-22 ENCOUNTER — Ambulatory Visit (INDEPENDENT_AMBULATORY_CARE_PROVIDER_SITE_OTHER): Payer: Medicare Other | Admitting: Family

## 2023-07-22 ENCOUNTER — Encounter: Payer: Self-pay | Admitting: Family

## 2023-07-22 ENCOUNTER — Ambulatory Visit: Payer: Medicare Other | Admitting: Family

## 2023-07-22 VITALS — BP 155/84 | HR 64 | Temp 98.0°F | Ht 71.0 in | Wt 185.5 lb

## 2023-07-22 DIAGNOSIS — E538 Deficiency of other specified B group vitamins: Secondary | ICD-10-CM | POA: Diagnosis not present

## 2023-07-22 DIAGNOSIS — R0781 Pleurodynia: Secondary | ICD-10-CM

## 2023-07-22 MED ORDER — CYANOCOBALAMIN 1000 MCG/ML IJ SOLN
1000.0000 ug | Freq: Once | INTRAMUSCULAR | Status: AC
Start: 2023-07-22 — End: 2023-07-22
  Administered 2023-07-22: 1000 ug via INTRAMUSCULAR

## 2023-07-22 NOTE — Progress Notes (Signed)
Patient ID: Timothy Gallagher, male    DOB: 02-16-1936, 87 y.o.   MRN: [id number]  Chief Complaint  Patient presents with   Abdominal Pain    Pt c/o abdominal pain on right side, Present for months. Has tried tylenol which slightly helped last night.    HPI:      Right side/chest pain:  pt here with wife who reports pt has had this pain for over 6 mos now and nothing has been done.  Pain is on the right side of his chest wall. Pain is constant, wife states he is taking Tylenol frequently during the day & night and she is concerned he is overdosing. Per review of his EMR, he had an X-ray in February which was normal. He describes the pain as tender and sore, not throbbing or burning. He states he pushes on his side and it is sore. The pain has remained the same over the past six months. The patient has also used Voltaren gel, which helps somewhat. Denies hx of coughing or rib injury.   Assessment & Plan:  1. Rib pain on right side ongoing pain, unsure if truly constant or intermittent tenderness when pt touches the area and b/c of dementia thinks he needs tylenol. Will refer to Ortho to see if possibly new imaging needed. Advised ok to continue using Voltaren gel on area and can look for OTC Lidocaine patches to use twice a day prn.  - Ambulatory referral to Orthopedic Surgery  2. B12 deficiency  - cyanocobalamin (VITAMIN B12) injection 1,000 mcg   Subjective:    Outpatient Medications Prior to Visit  Medication Sig Dispense Refill   aspirin 81 MG EC tablet Take 1 tablet (81 mg total) by mouth daily.     donepezil (ARICEPT) 10 MG tablet Take 1 tablet (10 mg total) by mouth daily. 90 tablet 3   FLUoxetine (PROZAC) 20 MG capsule Take 1 capsule (20 mg total) by mouth daily. 90 capsule 3   memantine (NAMENDA) 10 MG tablet Take 1 tablet (10 mg at night) for 2 weeks, then increase to 1 tablet (10 mg) twice a day 60 tablet 11   methimazole (TAPAZOLE) 5 MG tablet Take 0.5 tablets (2.5 mg total)  by mouth daily. 45 tablet 3   Multiple Vitamins-Minerals (CENTRUM SILVER PO) Take 1 tablet by mouth daily.     rosuvastatin (CRESTOR) 40 MG tablet Take 1 tablet (40 mg total) by mouth daily. Dose increase 01/13/22 due to heart disease and LDL goal under 70 90 tablet 3   tamsulosin (FLOMAX) 0.4 MG CAPS capsule Take 0.4 mg by mouth. Prescribed by urology     No facility-administered medications prior to visit.   Past Medical History:  Diagnosis Date   ACNE ROSACEA 06/27/2009   ACTINIC KERATOSIS, HEAD 04/18/2009   Acute maxillary sinusitis 05/14/2010   ALLERGIC RHINITIS 04/09/2007   Anal fissure 04/07/2016   B12 DEFICIENCY 06/07/2007   BACK PAIN WITH RADICULOPATHY 04/24/2008   Bilateral nephrolithiasis 07/07/2022   CT abd 06/2022 1. Nonobstructive bilateral nephrolithiasis measuring up to 5 mm on the right and 6 mm on the left.   Cancer [facility])    skin   CHEST WALL PAIN, ACUTE 06/15/2009   Chronic maxillary sinusitis 05/29/2008   COLITIS 04/27/2009   COLONIC POLYPS, HX OF 04/27/2009   tubular adenomas   Coronary artery disease 05/12/2014   Cath 05/12/2014 w/ severe 3-vessel CAD and preserved LV function, EF 55%   DERMATITIS, ATOPIC 10/12/2007  Diverticular disease 07/07/2022   CT abd 06/2022 . Colonic diverticulosis with no acute diverticulitis.   DIVERTICULOSIS, COLON 04/27/2009   ECCHYMOSES, SPONTANEOUS 06/27/2008   Elevated sedimentation rate 05/02/2009   ESOPHAGEAL STRICTURE 04/27/2009   GASTRITIS, CHRONIC 04/27/2009   HYPERLIPIDEMIA 03/13/2008   HYPERTENSION 04/09/2007   Hyperthyroidism    Iliac aneurysm (HCC) 07/07/2022   CT abd 06/2022 . Aneurysmal left common iliac artery (1.6 cm).     Internal bleeding hemorrhoids 01/22/2015   01/22/2015 Seen at anoscopy, grade 1 all 3 positions    Irritable bowel syndrome 08/11/2007   KIDNEY DISEASE 04/09/2007   NECK PAIN 09/14/2008   NEUROPATHY, IDIOPATHIC PERIPHERAL NEC 08/11/2007   OSTEOARTHRITIS, WRIST, RIGHT 08/05/2010   Postoperative  delirium 05/20/2014   S/P CABG x 4 05/19/2014   LIMA to LAD, SVG to diag, SVG to OM, SVG to PDA, EVH via right thigh and leg   Past Surgical History:  Procedure Laterality Date   CARDIAC CATHETERIZATION     COLONOSCOPY     CORONARY ARTERY BYPASS GRAFT N/A 05/19/2014   Procedure: CORONARY ARTERY BYPASS GRAFTING (CABG);  Surgeon: Purcell Nails, MD;  Location: [facility] OR;  Service: Open Heart Surgery;  Laterality: N/A;  Times 4 using left internal mammary artery and endoscopically harvested right saphenous vein   ESOPHAGOGASTRODUODENOSCOPY     FINGER SURGERY     cut off end of finger   FLEXIBLE SIGMOIDOSCOPY     HEMORRHOID BANDING     HERNIA REPAIR     INCISION AND DRAINAGE WOUND WITH FOREIGN BODY REMOVAL Left 12/20/2013   Procedure: INCISION AND DRAINAGE LEFT INDEX FINGER;  Surgeon: Tami Ribas, MD;  Location: [facility] ORS;  Service: Orthopedics;  Laterality: Left;   INTRAOPERATIVE TRANSESOPHAGEAL ECHOCARDIOGRAM N/A 05/19/2014   Procedure: INTRAOPERATIVE TRANSESOPHAGEAL ECHOCARDIOGRAM;  Surgeon: Purcell Nails, MD;  Location: [facility] OR;  Service: Open Heart Surgery;  Laterality: N/A;   lamenectomy     LEFT HEART CATHETERIZATION WITH CORONARY ANGIOGRAM N/A 05/12/2014   Procedure: LEFT HEART CATHETERIZATION WITH CORONARY ANGIOGRAM;  Surgeon: Lesleigh Noe, MD;  Location: [facility] CATH LAB;  Service: Cardiovascular;  Laterality: N/A;   LUMBAR FUSION     TONSILLECTOMY     VARICOSE VEIN SURGERY Left    Allergies  Allergen Reactions   Lipitor [Atorvastatin] Other (See Comments)    REACTION: nausea and blurred vision   Trazodone And Nefazodone Other (See Comments)    dizzy   Ciprofloxacin Swelling   Mycophenolate Mofetil Other (See Comments)    REACTION: unspecified   Amoxicillin Rash    Has patient had a PCN reaction causing immediate rash, facial/tongue/throat swelling, SOB or lightheadedness with hypotension: Unknown Has patient had a PCN reaction causing severe rash involving mucus membranes or skin  necrosis: Unknown Has patient had a PCN reaction that required hospitalization: Unknown Has patient had a PCN reaction occurring within the last 10 years: Unknown If all of the above answers are "NO", then may proceed with Cephalosporin use.    Penicillins Rash    Has patient had a PCN reaction causing immediate rash, facial/tongue/throat swelling, SOB or lightheadedness with hypotension: Unknown Has patient had a PCN reaction causing severe rash involving mucus membranes or skin necrosis: Unknown Has patient had a PCN reaction that required hospitalization: Unknown Has patient had a PCN reaction occurring within the last 10 years: Unknown If all of the above answers are "NO", then may proceed with Cephalosporin use.    Rosuvastatin Other (See Comments)  Unknown       Objective:    Physical Exam Vitals and nursing note reviewed.  Constitutional:      General: He is not in acute distress.    Appearance: Normal appearance.  HENT:     Head: Normocephalic.  Cardiovascular:     Rate and Rhythm: Normal rate and regular rhythm.  Pulmonary:     Effort: Pulmonary effort is normal.     Breath sounds: Normal breath sounds.  Chest:     Chest wall: Tenderness present. No mass, swelling or crepitus.    Musculoskeletal:        General: Normal range of motion.     Cervical back: Normal range of motion.  Skin:    General: Skin is warm and dry.  Neurological:     Mental Status: He is alert and oriented to person, place, and time.  Psychiatric:        Mood and Affect: Mood normal.    BP (!) 155/84 (BP Location: Left Arm, Patient Position: Sitting, Cuff Size: Large)   Pulse 64   Temp 98 F (36.7 C) (Temporal)   Ht 5\' 11"  (1.803 m)   Wt 185 lb 8 oz (84.1 kg)   SpO2 97%   BMI 25.87 kg/m  Wt Readings from Last 3 Encounters:  07/22/23 185 lb 8 oz (84.1 kg)  07/02/23 184 lb 12.8 oz (83.8 kg)  03/10/23 183 lb (83 kg)      Dulce Sellar, NP

## 2023-07-22 NOTE — Patient Instructions (Addendum)
It was very nice to see you today!   I am sending a referral to our Orthopedic office.  OK to keep using Voltaren gel on the area that is sore 3-4 times per day.   You can also look for Lidocaine patches over the counter to apply to the area.     PLEASE NOTE:  If you had any lab tests please let us know if you have not heard back within a few days. You may see your results on MyChart before we have a chance to review them but we will give you a call once they are reviewed by [place]. If we ordered any referrals today, please let us know if you have not heard from their office within the next week.

## 2023-07-29 ENCOUNTER — Ambulatory Visit: Payer: Medicare Other | Admitting: Orthopaedic Surgery

## 2023-07-29 ENCOUNTER — Encounter: Payer: Self-pay | Admitting: Orthopaedic Surgery

## 2023-07-29 DIAGNOSIS — R0781 Pleurodynia: Secondary | ICD-10-CM

## 2023-07-29 NOTE — Progress Notes (Signed)
Office Visit Note   Patient: Timothy Gallagher           Date of Birth: [birth year]           MRN: [id number] Visit Date: 07/29/2023              Requested by: Dulce Sellar, NP [address] [place],  [place] [address] PCP: [name], MD   Assessment & Plan: Visit Diagnoses:  1. Rib pain on right side     Plan: Impression is right sided rib pain.  At this point, symptoms are constant and have progressively worsened.  Chest x-ray from February of this year was unremarkable.  We have discussed obtaining a noncontrast CT scan for further evaluation.  Will call him with the results and make the appropriate referral at that time.  He will call us with concerns or questions in the meantime.  Follow-Up Instructions: Return if symptoms worsen or fail to improve.   Orders:  No orders of the defined types were placed in this encounter.  No orders of the defined types were placed in this encounter.     Procedures: No procedures performed   Clinical Data: No additional findings.   Subjective: Chief Complaint  Patient presents with   Chest - Pain    Right sided anterior rib pain    HPI patient is a pleasantly demented 87 year old gentleman who comes in today with his wife.  He is here with right sided rib pain for about 9 months or so.  He denies any injury or change in activity.  The pain he has seems to be to the anterior aspect around the area of T5, just under the right breast.  Wife tells me that the symptoms appear to be constant and have worsened over time.  He wakes up in the morning and takes a Tylenol and notes that he takes this throughout the day.  Patient himself denies any worsening symptoms with activity such as coughing, breathing, sneezing etc.  He has not taken any anti-inflammatories topical or oral.  Wife tells me he complains of shortness of breath when he goes down the driveway to get the trash.  He does have a history of a AAA which is being  followed by cardiology.  Wife notes he has seen Dr. Katrinka Blazing for this but he retired in December.  No history of cancer or DVT/PE.  He has recently seen his PCP who referred him to [place].  Of note, chest x-ray from 01/03/2023 was unremarkable.  Review of Systems as detailed in HPI.  All others reviewed and are negative.   Objective: Vital Signs: There were no vitals taken for this visit.  Physical Exam well-developed well-nourished gentleman in no acute distress.  Alert and oriented x 3.  Ortho Exam chest exam: Moderate point tenderness to palpation just below the right breast.  I am unable to appreciate any other chest wall pain.  Specialty Comments:  No specialty comments available.  Imaging: No new imaging   PMFS History: Patient Active Problem List   Diagnosis Date Noted   Graves disease 03/05/2023   Aortic atherosclerosis (HCC) 01/05/2023   Hyperglycemia 07/22/2022   Bilateral nephrolithiasis 07/07/2022   Diverticular disease 07/07/2022   Iliac aneurysm (HCC) 07/07/2022   Enlarged prostate 07/07/2022   Mixed vascular and neurodegenerative dementia without behavioral disturbance (HCC) 11/28/2021   Abdominal aortic aneurysm (AAA) without rupture, unspecified part (HCC) 11/27/2021   Chronic obstructive pulmonary disease (HCC) 08/26/2019  History of atrial fibrillation 08/26/2019   Degeneration of lumbar intervertebral disc 10/21/2018   Hyperthyroidism 01/13/2018   Detached retina, left 06/30/2017   AAA (abdominal aortic aneurysm) (HCC) 05/12/2017   Left sided abdominal pain 04/15/2017   Low back pain 02/06/2017   BPPV (benign paroxysmal positional vertigo) 09/01/2016   Anal fissure 04/07/2016   Internal and external bleeding hemorrhoids 01/22/2015   Insomnia 11/22/2014   Anemia 07/20/2014   CAD (coronary artery disease) of artery bypass graft 05/19/2014   BPH (benign prostatic hyperplasia) 12/05/2013   Palpitations 02/11/2013   OSTEOARTHRITIS, WRIST, RIGHT 08/05/2010    ACNE ROSACEA 06/27/2009   ESOPHAGEAL STRICTURE 04/27/2009   ACTINIC KERATOSIS, HEAD 04/18/2009   NECK PAIN 09/14/2008   Hyperlipidemia 03/13/2008   NEUROPATHY, IDIOPATHIC PERIPHERAL NEC 08/11/2007   Irritable bowel syndrome 08/11/2007   B12 deficiency 06/07/2007   Essential hypertension 04/09/2007   ALLERGIC RHINITIS 04/09/2007   Past Medical History:  Diagnosis Date   ACNE ROSACEA 06/27/2009   ACTINIC KERATOSIS, HEAD 04/18/2009   Acute maxillary sinusitis 05/14/2010   ALLERGIC RHINITIS 04/09/2007   Anal fissure 04/07/2016   B12 DEFICIENCY 06/07/2007   BACK PAIN WITH RADICULOPATHY 04/24/2008   Bilateral nephrolithiasis 07/07/2022   CT abd 06/2022 1. Nonobstructive bilateral nephrolithiasis measuring up to 5 mm on the right and 6 mm on the left.   Cancer [facility])    skin   CHEST WALL PAIN, ACUTE 06/15/2009   Chronic maxillary sinusitis 05/29/2008   COLITIS 04/27/2009   COLONIC POLYPS, HX OF 04/27/2009   tubular adenomas   Coronary artery disease 05/12/2014   Cath 05/12/2014 w/ severe 3-vessel CAD and preserved LV function, EF 55%   DERMATITIS, ATOPIC 10/12/2007   Diverticular disease 07/07/2022   CT abd 06/2022 . Colonic diverticulosis with no acute diverticulitis.   DIVERTICULOSIS, COLON 04/27/2009   ECCHYMOSES, SPONTANEOUS 06/27/2008   Elevated sedimentation rate 05/02/2009   ESOPHAGEAL STRICTURE 04/27/2009   GASTRITIS, CHRONIC 04/27/2009   HYPERLIPIDEMIA 03/13/2008   HYPERTENSION 04/09/2007   Hyperthyroidism    Iliac aneurysm (HCC) 07/07/2022   CT abd 06/2022 . Aneurysmal left common iliac artery (1.6 cm).     Internal bleeding hemorrhoids 01/22/2015   01/22/2015 Seen at anoscopy, grade 1 all 3 positions    Irritable bowel syndrome 08/11/2007   KIDNEY DISEASE 04/09/2007   NECK PAIN 09/14/2008   NEUROPATHY, IDIOPATHIC PERIPHERAL NEC 08/11/2007   OSTEOARTHRITIS, WRIST, RIGHT 08/05/2010   Postoperative delirium 05/20/2014   S/P CABG x 4 05/19/2014   LIMA to LAD, SVG to diag,  SVG to OM, SVG to PDA, EVH via right thigh and leg    Family History  Problem Relation Age of Onset   Hernia Mother    Heart disease Father        smoker   COPD Father    Heart attack Father    Heart attack Brother    Early death Daughter    Colon cancer Neg Hx    Esophageal cancer Neg Hx    Pancreatic cancer Neg Hx     Past Surgical History:  Procedure Laterality Date   CARDIAC CATHETERIZATION     COLONOSCOPY     CORONARY ARTERY BYPASS GRAFT N/A 05/19/2014   Procedure: CORONARY ARTERY BYPASS GRAFTING (CABG);  Surgeon: Purcell Nails, MD;  Location: [facility] OR;  Service: Open Heart Surgery;  Laterality: N/A;  Times 4 using left internal mammary artery and endoscopically harvested right saphenous vein   ESOPHAGOGASTRODUODENOSCOPY     FINGER SURGERY  cut off end of finger   FLEXIBLE SIGMOIDOSCOPY     HEMORRHOID BANDING     HERNIA REPAIR     INCISION AND DRAINAGE WOUND WITH FOREIGN BODY REMOVAL Left 12/20/2013   Procedure: INCISION AND DRAINAGE LEFT INDEX FINGER;  Surgeon: Tami Ribas, MD;  Location: [facility] ORS;  Service: Orthopedics;  Laterality: Left;   INTRAOPERATIVE TRANSESOPHAGEAL ECHOCARDIOGRAM N/A 05/19/2014   Procedure: INTRAOPERATIVE TRANSESOPHAGEAL ECHOCARDIOGRAM;  Surgeon: Purcell Nails, MD;  Location: [facility] OR;  Service: Open Heart Surgery;  Laterality: N/A;   lamenectomy     LEFT HEART CATHETERIZATION WITH CORONARY ANGIOGRAM N/A 05/12/2014   Procedure: LEFT HEART CATHETERIZATION WITH CORONARY ANGIOGRAM;  Surgeon: Lesleigh Noe, MD;  Location: [facility] CATH LAB;  Service: Cardiovascular;  Laterality: N/A;   LUMBAR FUSION     TONSILLECTOMY     VARICOSE VEIN SURGERY Left    Social History   Occupational History   Occupation: retired    Associate Professor: RETIRED    Comment: from [employer]  Tobacco Use   Smoking status: Former    Current packs/day: 0.00    Average packs/day: 0.5 packs/day for 42.0 years (21.0 ttl pk-yrs)    Types: Cigarettes    Start date: 11/17/1956    Quit date:  11/17/1998    Years since quitting: 24.7   Smokeless tobacco: Never  Vaping Use   Vaping status: Never Used  Substance and Sexual Activity   Alcohol use: No   Drug use: No   Sexual activity: Yes

## 2023-08-04 ENCOUNTER — Encounter: Payer: Self-pay | Admitting: Orthopaedic Surgery

## 2023-08-05 ENCOUNTER — Encounter: Payer: Self-pay | Admitting: Orthopaedic Surgery

## 2023-08-10 ENCOUNTER — Ambulatory Visit: Payer: Medicare Other | Admitting: Physician Assistant

## 2023-08-13 ENCOUNTER — Ambulatory Visit
Admission: RE | Admit: 2023-08-13 | Discharge: 2023-08-13 | Disposition: A | Discharge status: Home / Self Care | Payer: Medicare Other | Source: Ambulatory Visit | Attending: Orthopaedic Surgery | Admitting: Orthopaedic Surgery

## 2023-08-13 DIAGNOSIS — R0781 Pleurodynia: Secondary | ICD-10-CM | POA: Diagnosis not present

## 2023-08-13 DIAGNOSIS — I7121 Aneurysm of the ascending aorta, without rupture: Secondary | ICD-10-CM | POA: Diagnosis not present

## 2023-08-13 DIAGNOSIS — I700 Atherosclerosis of aorta: Secondary | ICD-10-CM | POA: Diagnosis not present

## 2023-08-26 ENCOUNTER — Encounter: Payer: Self-pay | Admitting: Orthopaedic Surgery

## 2023-08-26 ENCOUNTER — Ambulatory Visit: Payer: Medicare Other | Admitting: Orthopaedic Surgery

## 2023-08-26 DIAGNOSIS — R0781 Pleurodynia: Secondary | ICD-10-CM | POA: Diagnosis not present

## 2023-08-26 NOTE — Progress Notes (Signed)
Office Visit Note   Patient: Timothy Gallagher           Date of Birth: 01-03-1936           MRN: [id number] Visit Date: 08/26/2023              Requested by: [name], MD [address] [place],  [place] [address] PCP: [name], MD   Assessment & Plan: Visit Diagnoses:  1. Rib pain on right side     Plan: CT scan is negative for rib abnormality.  There is an incidental finding of enlargement of the thoracic aorta.  We will send a referral to CT surgery for further evaluation and treatment.  Follow-up with me as needed.  Follow-Up Instructions: No follow-ups on file.   Orders:  No orders of the defined types were placed in this encounter.  No orders of the defined types were placed in this encounter.     Procedures: No procedures performed   Clinical Data: No additional findings.   Subjective: Chief Complaint  Patient presents with   Chest - Follow-up    CT review    HPI Patient returns today to discuss recent CT scan. Review of Systems   Objective: Vital Signs: There were no vitals taken for this visit.  Physical Exam  Ortho Exam Exam is unchanged. Specialty Comments:  No specialty comments available.  Imaging: No results found.   PMFS History: Patient Active Problem List   Diagnosis Date Noted   Graves disease 03/05/2023   Aortic atherosclerosis (HCC) 01/05/2023   Hyperglycemia 07/22/2022   Bilateral nephrolithiasis 07/07/2022   Diverticular disease 07/07/2022   Iliac aneurysm (HCC) 07/07/2022   Enlarged prostate 07/07/2022   Mixed vascular and neurodegenerative dementia without behavioral disturbance (HCC) 11/28/2021   Abdominal aortic aneurysm (AAA) without rupture, unspecified part (HCC) 11/27/2021   Chronic obstructive pulmonary disease (HCC) 08/26/2019   History of atrial fibrillation 08/26/2019   Degeneration of lumbar intervertebral disc 10/21/2018   Hyperthyroidism 01/13/2018   Detached retina, left 06/30/2017    AAA (abdominal aortic aneurysm) (HCC) 05/12/2017   Left sided abdominal pain 04/15/2017   Low back pain 02/06/2017   BPPV (benign paroxysmal positional vertigo) 09/01/2016   Anal fissure 04/07/2016   Internal and external bleeding hemorrhoids 01/22/2015   Insomnia 11/22/2014   Anemia 07/20/2014   CAD (coronary artery disease) of artery bypass graft 05/19/2014   BPH (benign prostatic hyperplasia) 12/05/2013   Palpitations 02/11/2013   OSTEOARTHRITIS, WRIST, RIGHT 08/05/2010   ACNE ROSACEA 06/27/2009   ESOPHAGEAL STRICTURE 04/27/2009   ACTINIC KERATOSIS, HEAD 04/18/2009   NECK PAIN 09/14/2008   Hyperlipidemia 03/13/2008   NEUROPATHY, IDIOPATHIC PERIPHERAL NEC 08/11/2007   Irritable bowel syndrome 08/11/2007   B12 deficiency 06/07/2007   Essential hypertension 04/09/2007   ALLERGIC RHINITIS 04/09/2007   Past Medical History:  Diagnosis Date   ACNE ROSACEA 06/27/2009   ACTINIC KERATOSIS, HEAD 04/18/2009   Acute maxillary sinusitis 05/14/2010   ALLERGIC RHINITIS 04/09/2007   Anal fissure 04/07/2016   B12 DEFICIENCY 06/07/2007   BACK PAIN WITH RADICULOPATHY 04/24/2008   Bilateral nephrolithiasis 07/07/2022   CT abd 06/2022 1. Nonobstructive bilateral nephrolithiasis measuring up to 5 mm on the right and 6 mm on the left.   Cancer [facility])    skin   CHEST WALL PAIN, ACUTE 06/15/2009   Chronic maxillary sinusitis 05/29/2008   COLITIS 04/27/2009   COLONIC POLYPS, HX OF 04/27/2009   tubular adenomas   Coronary artery disease  05/12/2014   Cath 05/12/2014 w/ severe 3-vessel CAD and preserved LV function, EF 55%   DERMATITIS, ATOPIC 10/12/2007   Diverticular disease 07/07/2022   CT abd 06/2022 . Colonic diverticulosis with no acute diverticulitis.   DIVERTICULOSIS, COLON 04/27/2009   ECCHYMOSES, SPONTANEOUS 06/27/2008   Elevated sedimentation rate 05/02/2009   ESOPHAGEAL STRICTURE 04/27/2009   GASTRITIS, CHRONIC 04/27/2009   HYPERLIPIDEMIA 03/13/2008   HYPERTENSION 04/09/2007    Hyperthyroidism    Iliac aneurysm (HCC) 07/07/2022   CT abd 06/2022 . Aneurysmal left common iliac artery (1.6 cm).     Internal bleeding hemorrhoids 01/22/2015   01/22/2015 Seen at anoscopy, grade 1 all 3 positions    Irritable bowel syndrome 08/11/2007   KIDNEY DISEASE 04/09/2007   NECK PAIN 09/14/2008   NEUROPATHY, IDIOPATHIC PERIPHERAL NEC 08/11/2007   OSTEOARTHRITIS, WRIST, RIGHT 08/05/2010   Postoperative delirium 05/20/2014   S/P CABG x 4 05/19/2014   LIMA to LAD, SVG to diag, SVG to OM, SVG to PDA, EVH via right thigh and leg    Family History  Problem Relation Age of Onset   Hernia Mother    Heart disease Father        smoker   COPD Father    Heart attack Father    Heart attack Brother    Early death Daughter    Colon cancer Neg Hx    Esophageal cancer Neg Hx    Pancreatic cancer Neg Hx     Past Surgical History:  Procedure Laterality Date   CARDIAC CATHETERIZATION     COLONOSCOPY     CORONARY ARTERY BYPASS GRAFT N/A 05/19/2014   Procedure: CORONARY ARTERY BYPASS GRAFTING (CABG);  Surgeon: Purcell Nails, MD;  Location[facility] OR;  Service: Open Heart Surgery;  Laterality: N/A;  Times 4 using left internal mammary artery and endoscopically harvested right saphenous vein   ESOPHAGOGASTRODUODENOSCOPY     FINGER SURGERY     cut off end of finger   FLEXIBLE SIGMOIDOSCOPY     HEMORRHOID BANDING     HERNIA REPAIR     INCISION AND DRAINAGE WOUND WITH FOREIGN BODY REMOVAL Left 12/20/2013   Procedure: INCISION AND DRAINAGE LEFT INDEX FINGER;  Surgeon: Tami Ribas, MD;  Location: [facility] ORS;  Service: Orthopedics;  Laterality: Left;   INTRAOPERATIVE TRANSESOPHAGEAL ECHOCARDIOGRAM N/A 05/19/2014   Procedure: INTRAOPERATIVE TRANSESOPHAGEAL ECHOCARDIOGRAM;  Surgeon: Purcell Nails, MD;  Location: [facility] OR;  Service: Open Heart Surgery;  Laterality: N/A;   lamenectomy     LEFT HEART CATHETERIZATION WITH CORONARY ANGIOGRAM N/A 05/12/2014   Procedure: LEFT HEART CATHETERIZATION WITH CORONARY  ANGIOGRAM;  Surgeon: Lesleigh Noe, MD;  Location: [facility] CATH LAB;  Service: Cardiovascular;  Laterality: N/A;   LUMBAR FUSION     TONSILLECTOMY     VARICOSE VEIN SURGERY Left    Social History   Occupational History   Occupation: retired    Associate Professor: RETIRED    Comment: from [employer]  Tobacco Use   Smoking status: Former    Current packs/day: 0.00    Average packs/day: 0.5 packs/day for 42.0 years (21.0 ttl pk-yrs)    Types: Cigarettes    Start date: 11/17/1956    Quit date: 11/17/1998    Years since quitting: 24.7   Smokeless tobacco: Never  Vaping Use   Vaping status: Never Used  Substance and Sexual Activity   Alcohol use: No   Drug use: No   Sexual activity: Yes

## 2023-08-26 NOTE — Addendum Note (Signed)
Addended by: Wendi Maya on: 08/26/2023 10:52 AM   Modules accepted: Orders

## 2023-08-27 ENCOUNTER — Ambulatory Visit: Payer: Medicare Other

## 2023-08-27 DIAGNOSIS — E538 Deficiency of other specified B group vitamins: Secondary | ICD-10-CM

## 2023-08-27 DIAGNOSIS — Z23 Encounter for immunization: Secondary | ICD-10-CM | POA: Diagnosis not present

## 2023-08-27 MED ORDER — CYANOCOBALAMIN 1000 MCG/ML IJ SOLN
1000.0000 ug | Freq: Once | INTRAMUSCULAR | Status: AC
Start: 2023-08-27 — End: 2023-08-27
  Administered 2023-08-27: 1000 ug via INTRAMUSCULAR

## 2023-08-27 NOTE — Progress Notes (Addendum)
Patient is in office today for a nurse visit for B12 Injection, per PCP's order. Patient Injection was given in the  Right deltoid. Patient tolerated injection well.

## 2023-08-27 NOTE — Addendum Note (Signed)
Addended by: Samara Deist on: 08/27/2023 02:52 PM   Modules accepted: Orders

## 2023-09-04 ENCOUNTER — Telehealth: Payer: Self-pay

## 2023-09-04 ENCOUNTER — Encounter: Payer: Self-pay | Admitting: Internal Medicine

## 2023-09-04 ENCOUNTER — Ambulatory Visit: Payer: Medicare Other | Admitting: Internal Medicine

## 2023-09-04 VITALS — BP 120/60 | HR 85 | Ht 71.0 in | Wt 188.2 lb

## 2023-09-04 DIAGNOSIS — E0590 Thyrotoxicosis, unspecified without thyrotoxic crisis or storm: Secondary | ICD-10-CM | POA: Diagnosis not present

## 2023-09-04 DIAGNOSIS — E0500 Thyrotoxicosis with diffuse goiter without thyrotoxic crisis or storm: Secondary | ICD-10-CM

## 2023-09-04 LAB — T3, FREE: T3, Free: 3.1 pg/mL (ref 2.3–4.2)

## 2023-09-04 LAB — TSH: TSH: 1.52 u[IU]/mL (ref 0.35–5.50)

## 2023-09-04 LAB — T4, FREE: Free T4: 0.84 ng/dL (ref 0.60–1.60)

## 2023-09-04 MED ORDER — METHIMAZOLE 5 MG PO TABS
2.5000 mg | ORAL_TABLET | Freq: Every day | ORAL | 3 refills | Status: DC
Start: 2023-09-04 — End: 2024-03-25

## 2023-09-04 NOTE — Telephone Encounter (Signed)
Results and medication advisement given to spouse, no further questions at this time.

## 2023-09-04 NOTE — Progress Notes (Signed)
Patient ID: Glenice Bow, male   DOB: September 16, 1936, 87 y.o.   MRN: [id number]  HPI  MAKO ZHAO is a 87 y.o.-year-old male, returning for follow-up for thyrotoxicosis, diagnosed as Graves' disease 08/2022.  He previously saw Dr. Everardo All, but last visit with me 6 months ago.  He is here with his wife who offers part of the information especially related to past medical history, previous studies done and medication.  Interim history: Patient feels well, without complaints. However, it is difficult to extract information from him as he jokes continuously.  Wife offers most of the history and confirms that he has no complaints about new symptoms. He has an increased appetite (snacks) - not new.  Reviewed and addended history: Patient was diagnosed with thyrotoxicosis in 2019. We confirmed Graves' disease in 08/2022 by elevated TSI's. He did not have imaging or thyroid antibody checks. Previously on MMI (methimazole) 5 mg daily. In 02/2023, we decreased the dose of MMI to 2.5 mg daily (takes 5 mg every other day).  I reviewed pt's thyroid tests: Lab Results  Component Value Date   TSH 2.83 07/02/2023   TSH 7.85 (H) 03/05/2023   TSH 5.37 09/01/2022   TSH 5.60 (H) 07/22/2022   TSH 3.86 02/11/2022   TSH 0.24 (L) 11/27/2021   TSH 0.02 (L) 10/01/2021   TSH 0.98 03/26/2021   TSH 4.62 (H) 10/01/2020   TSH 6.65 (H) 07/25/2020   FREET4 0.73 07/02/2023   FREET4 0.74 03/05/2023   FREET4 0.70 09/01/2022   FREET4 0.76 07/22/2022   FREET4 0.72 02/11/2022   FREET4 0.84 11/27/2021   FREET4 1.43 10/01/2021   FREET4 0.93 03/26/2021   FREET4 0.84 10/01/2020   FREET4 0.68 07/25/2020   T3FREE 2.9 07/02/2023   T3FREE 2.8 03/05/2023   T3FREE 3.2 09/01/2022   T3FREE 3.8 01/13/2018   Antithyroid antibodies: Lab Results  Component Value Date   TSI 204 (H) 09/01/2022   Pt denies: - feeling nodules in neck - hoarseness - dysphagia >> improved after starting Omeprazole - choking  Ba swallow  (06/24/2022): 1. No esophageal mucosal lesions or strictures seen. 2. Significant esophageal dysmotility. 3. Esophagus does clear in the upright position with both water and dry swallowing, allowing for normal passage of a barium tablet.  He denies: - fatigue - excessive sweating/heat intolerance - tremors (he has mild tremors on physical exam - chronic) - anxiety - palpitations - weight loss  Pt has a FH of thyroid ds.: + son. No FH of thyroid cancer. No h/o radiation tx to head or neck. No recent contrast studies. No steroid use. No herbal supplements. No Biotin use.  Pt. also has a history of CAD - s/p CABG x4, HTN, CKD, HL, B12 deficiency, nephrolithiasis.  He is on Namenda per Dr. Everlena Cooper. He saw ophthalmology at [facility] (Dr. Johny Drilling) for the following: 1. Recurrent subtotal retinal detachment OS 2. Proliferative vitreoretinopathy OS 3. History of chronic retinal detachment OS 4. s/p retinal detachment repair with vitrectomy, membrane peel, laser and gas 06/17/17  ROS: Constitutional: + see HPI  Past Medical History:  Diagnosis Date   ACNE ROSACEA 06/27/2009   ACTINIC KERATOSIS, HEAD 04/18/2009   Acute maxillary sinusitis 05/14/2010   ALLERGIC RHINITIS 04/09/2007   Anal fissure 04/07/2016   B12 DEFICIENCY 06/07/2007   BACK PAIN WITH RADICULOPATHY 04/24/2008   Bilateral nephrolithiasis 07/07/2022   CT abd 06/2022 1. Nonobstructive bilateral nephrolithiasis measuring up to 5 mm on the right and 6 mm on the left.  Cancer [facility])    skin   CHEST WALL PAIN, ACUTE 06/15/2009   Chronic maxillary sinusitis 05/29/2008   COLITIS 04/27/2009   COLONIC POLYPS, HX OF 04/27/2009   tubular adenomas   Coronary artery disease 05/12/2014   Cath 05/12/2014 w/ severe 3-vessel CAD and preserved LV function, EF 55%   DERMATITIS, ATOPIC 10/12/2007   Diverticular disease 07/07/2022   CT abd 06/2022 . Colonic diverticulosis with no acute diverticulitis.   DIVERTICULOSIS, COLON 04/27/2009   ECCHYMOSES,  SPONTANEOUS 06/27/2008   Elevated sedimentation rate 05/02/2009   ESOPHAGEAL STRICTURE 04/27/2009   GASTRITIS, CHRONIC 04/27/2009   HYPERLIPIDEMIA 03/13/2008   HYPERTENSION 04/09/2007   Hyperthyroidism    Iliac aneurysm (HCC) 07/07/2022   CT abd 06/2022 . Aneurysmal left common iliac artery (1.6 cm).     Internal bleeding hemorrhoids 01/22/2015   01/22/2015 Seen at anoscopy, grade 1 all 3 positions    Irritable bowel syndrome 08/11/2007   KIDNEY DISEASE 04/09/2007   NECK PAIN 09/14/2008   NEUROPATHY, IDIOPATHIC PERIPHERAL NEC 08/11/2007   OSTEOARTHRITIS, WRIST, RIGHT 08/05/2010   Postoperative delirium 05/20/2014   S/P CABG x 4 05/19/2014   LIMA to LAD, SVG to diag, SVG to OM, SVG to PDA, EVH via right thigh and leg   Past Surgical History:  Procedure Laterality Date   CARDIAC CATHETERIZATION     COLONOSCOPY     CORONARY ARTERY BYPASS GRAFT N/A 05/19/2014   Procedure: CORONARY ARTERY BYPASS GRAFTING (CABG);  Surgeon: Purcell Nails, MD;  Location: [facility] OR;  Service: Open Heart Surgery;  Laterality: N/A;  Times 4 using left internal mammary artery and endoscopically harvested right saphenous vein   ESOPHAGOGASTRODUODENOSCOPY     FINGER SURGERY     cut off end of finger   FLEXIBLE SIGMOIDOSCOPY     HEMORRHOID BANDING     HERNIA REPAIR     INCISION AND DRAINAGE WOUND WITH FOREIGN BODY REMOVAL Left 12/20/2013   Procedure: INCISION AND DRAINAGE LEFT INDEX FINGER;  Surgeon: Tami Ribas, MD;  Location: [facility] ORS;  Service: Orthopedics;  Laterality: Left;   INTRAOPERATIVE TRANSESOPHAGEAL ECHOCARDIOGRAM N/A 05/19/2014   Procedure: INTRAOPERATIVE TRANSESOPHAGEAL ECHOCARDIOGRAM;  Surgeon: Purcell Nails, MD;  Location: [facility] OR;  Service: Open Heart Surgery;  Laterality: N/A;   lamenectomy     LEFT HEART CATHETERIZATION WITH CORONARY ANGIOGRAM N/A 05/12/2014   Procedure: LEFT HEART CATHETERIZATION WITH CORONARY ANGIOGRAM;  Surgeon: Lesleigh Noe, MD;  Location: [facility] CATH LAB;  Service: Cardiovascular;   Laterality: N/A;   LUMBAR FUSION     TONSILLECTOMY     VARICOSE VEIN SURGERY Left    Social History   Socioeconomic History   Marital status: Married    Spouse name: Not on file   Number of children: 3   Years of education: 11   Highest education level: Not on file  Occupational History   Occupation: retired    Associate Professor: RETIRED    Comment: from [employer]  Tobacco Use   Smoking status: Former    Current packs/day: 0.00    Average packs/day: 0.5 packs/day for 42.0 years (21.0 ttl pk-yrs)    Types: Cigarettes    Start date: 11/17/1956    Quit date: 11/17/1998    Years since quitting: 24.8   Smokeless tobacco: Never  Vaping Use   Vaping status: Never Used  Substance and Sexual Activity   Alcohol use: No   Drug use: No   Sexual activity: Yes  Other Topics Concern   Not on  file  Social History Narrative   Married 35 years (2nd marriage). 2 kids from previous marriage (lost a 3rd child hit by car at age 66). 2 stepchildren. 6 grandkids. 2 greatgrandkids.       Retired from [employer]      Hobbies: walking/exercise, building in shop-furniture (cut finger off in February doing this)   6 grand children - Has bought them all a new car    Brother died of probably addiction   Sister also died; not sure of cause    He enjoys his life; Likes to play the keyboard; guitar    Had played in a gospel quartet    Enjoys working with wood    Right handed   Drinks caffeine   Social Determinants of Health   Financial Resource Strain: Low Risk  (10/16/2022)   Overall Financial Resource Strain (CARDIA)    Difficulty of Paying Living Expenses: Not hard at all  Food Insecurity: No Food Insecurity (10/16/2022)   Hunger Vital Sign    Worried About Running Out of Food in the Last Year: Never true    Ran Out of Food in the Last Year: Never true  Transportation Needs: No Transportation Needs (10/16/2022)   PRAPARE - Administrator, Civil Service (Medical): No     Lack of Transportation (Non-Medical): No  Physical Activity: Inactive (10/16/2022)   Exercise Vital Sign    Days of Exercise per Week: 0 days    Minutes of Exercise per Session: 0 min  Stress: No Stress Concern Present (10/16/2022)   [employer] of Occupational Health - Occupational Stress Questionnaire    Feeling of Stress : Not at all  Social Connections: Moderately Isolated (10/16/2022)   Social Connection and Isolation Panel [NHANES]    Frequency of Communication with Friends and Family: Once a week    Frequency of Social Gatherings with Friends and Family: More than three times a week    Attends Religious Services: Never    Database administrator or Organizations: No    Attends Banker Meetings: Never    Marital Status: Married  Catering manager Violence: Not At Risk (10/16/2022)   Humiliation, Afraid, Rape, and Kick questionnaire    Fear of Current or Ex-Partner: No    Emotionally Abused: No    Physically Abused: No    Sexually Abused: No   Current Outpatient Medications on File Prior to Visit  Medication Sig Dispense Refill   aspirin 81 MG EC tablet Take 1 tablet (81 mg total) by mouth daily.     donepezil (ARICEPT) 10 MG tablet Take 1 tablet (10 mg total) by mouth daily. 90 tablet 3   FLUoxetine (PROZAC) 20 MG capsule Take 1 capsule (20 mg total) by mouth daily. 90 capsule 3   memantine (NAMENDA) 10 MG tablet Take 1 tablet (10 mg at night) for 2 weeks, then increase to 1 tablet (10 mg) twice a day 60 tablet 11   methimazole (TAPAZOLE) 5 MG tablet Take 0.5 tablets (2.5 mg total) by mouth daily. 45 tablet 3   Multiple Vitamins-Minerals (CENTRUM SILVER PO) Take 1 tablet by mouth daily.     rosuvastatin (CRESTOR) 40 MG tablet Take 1 tablet (40 mg total) by mouth daily. Dose increase 01/13/22 due to heart disease and LDL goal under 70 90 tablet 3   tamsulosin (FLOMAX) 0.4 MG CAPS capsule Take 0.4 mg by mouth. Prescribed by urology     No current  facility-administered medications  on file prior to visit.   Allergies  Allergen Reactions   Lipitor [Atorvastatin] Other (See Comments)    REACTION: nausea and blurred vision   Trazodone And Nefazodone Other (See Comments)    dizzy   Ciprofloxacin Swelling   Mycophenolate Mofetil Other (See Comments)    REACTION: unspecified   Amoxicillin Rash    Has patient had a PCN reaction causing immediate rash, facial/tongue/throat swelling, SOB or lightheadedness with hypotension: Unknown Has patient had a PCN reaction causing severe rash involving mucus membranes or skin necrosis: Unknown Has patient had a PCN reaction that required hospitalization: Unknown Has patient had a PCN reaction occurring within the last 10 years: Unknown If all of the above answers are "NO", then may proceed with Cephalosporin use.    Penicillins Rash    Has patient had a PCN reaction causing immediate rash, facial/tongue/throat swelling, SOB or lightheadedness with hypotension: Unknown Has patient had a PCN reaction causing severe rash involving mucus membranes or skin necrosis: Unknown Has patient had a PCN reaction that required hospitalization: Unknown Has patient had a PCN reaction occurring within the last 10 years: Unknown If all of the above answers are "NO", then may proceed with Cephalosporin use.    Rosuvastatin Other (See Comments)    Unknown    Family History  Problem Relation Age of Onset   Hernia Mother    Heart disease Father        smoker   COPD Father    Heart attack Father    Heart attack Brother    Early death Daughter    Colon cancer Neg Hx    Esophageal cancer Neg Hx    Pancreatic cancer Neg Hx    PE: BP 120/60   Pulse 85   Ht 5\' 11"  (1.803 m)   Wt 188 lb 3.2 oz (85.4 kg)   SpO2 96%   BMI 26.25 kg/m  Wt Readings from Last 3 Encounters:  09/04/23 188 lb 3.2 oz (85.4 kg)  07/22/23 185 lb 8 oz (84.1 kg)  07/02/23 184 lb 12.8 oz (83.8 kg)   Constitutional: normal weight, in  NAD Eyes: no exophthalmos, no lid lag, no stare ENT: no thyromegaly, no thyroid bruits, no cervical lymphadenopathy Cardiovascular: RRR, No MRG Respiratory: CTA B Musculoskeletal: no deformities Skin: no rashes Neurological: + mild tremor with outstretched hands  ASSESSMENT: 1.  Graves' disease - dx'ed as thyrotoxicosis in 2019, then as Graves ds. In 2023  PLAN:  1. Patient with a history of a low TSH and elevated TSI antibodies, with thyrotoxic symptoms: Weight loss, heat intolerance, hyperdefecation, palpitations, anxiety, all improved after starting methimazole.  He previously declined RAI treatment per review of Dr. George Hugh notes.  At last visit, we discussed about different treatment options for Graves' disease including continuing methimazole, RAI ablation, or surgery, but since she was doing well on methimazole, we continued this. - At last visit, he was on 5 mg of methimazole daily but the TSH was elevated, at 7.85 so I recommended to decrease the dose to half a tablet daily.  A repeat set of TFTs were normal in 06/2023. - He tolerates methimazole well.  He is also on a beta-blocker, Toprol, for CAD. - he appears euthyroid, without complaints.  He has tremor with outstretched hands, which is chronic for him.  No excessive weight loss, heat intolerance, insomnia, anxiety.  Also, no tachycardia.  Patient mentions that he eats frequently throughout the day and wife confirms that he is eating many  snacks.  However, this has been present for a long time. - He has no signs of active Graves' ophthalmopathy including double vision, blurry vision, eye pain, chemosis - At today's visit, we will check his TFTs and change the methimazole dose accordingly - OTW I will see him back in 1 year but for labs in 6 mo  He needs refills of methimazole for 1 year.  Component     Latest Ref Rng 09/04/2023  TSH     0.35 - 5.50 uIU/mL 1.52   T4,Free(Direct)     0.60 - 1.60 ng/dL 1.61    Triiodothyronine,Free,Serum     2.3 - 4.2 pg/mL 3.1   Normal TFTs.  Will repeat his TFTs in 6 months, but continue the same dose of methimazole for now.  Carlus Pavlov, MD PhD [facility] Endocrinology

## 2023-09-04 NOTE — Patient Instructions (Signed)
Please stop at the lab.  Please continue Methimazole 5 mg every other day.  You should have an endocrinology follow-up appointment in 1 year and in  6 months for labs.

## 2023-09-04 NOTE — Telephone Encounter (Signed)
-----   Message from Carlus Pavlov sent at 09/04/2023 12:50 PM EDT ----- Can you please call pt.:  Patient's thyroid tests are normal so he can continue the same dose of methimazole, 5 mg every other day.  I refilled this for him.

## 2023-09-09 ENCOUNTER — Ambulatory Visit: Payer: Medicare Other | Admitting: Physician Assistant

## 2023-09-09 ENCOUNTER — Encounter: Payer: Self-pay | Admitting: Physician Assistant

## 2023-09-09 VITALS — BP 112/73 | HR 82 | Resp 20 | Ht 71.0 in | Wt 190.0 lb

## 2023-09-09 DIAGNOSIS — F0150 Vascular dementia without behavioral disturbance: Secondary | ICD-10-CM | POA: Diagnosis not present

## 2023-09-09 MED ORDER — DONEPEZIL HCL 23 MG PO TABS: 23.0000 mg | ORAL_TABLET | Freq: Every day | ORAL | 11 refills | Status: DC

## 2023-09-09 MED ORDER — MEMANTINE HCL 10 MG PO TABS: ORAL_TABLET | ORAL | 11 refills | Status: DC
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
## 2023-09-09 NOTE — Patient Instructions (Addendum)
It was a pleasure to see you today at our office.   Recommendations:  Follow up in 6 months  Increase donepezil to 23 mg daily.   Continue Memantine 10 mg twice a day.    Make sure to check the hearing  Continue B12 supplements  Consider [facility]  [address], [place] [address] [phone]  Hours of Operation Mondays to Thursdays: 8 am to 8 pm,Fridays: 9 am to 8 pm, Saturdays: 9 am to 1 pm Sundays: Closed  [email or web address]   PLease feel free to call Sidney Ace, Social Worker at [facility] at [phone] for guidance if placement were needed at the facility. Also, you can call [facility] [phone] for guidance    RECOMMENDATIONS FOR ALL PATIENTS WITH MEMORY PROBLEMS: 1. Continue to exercise (Recommend 30 minutes of walking everyday, or 3 hours every week) 2. Increase social interactions - continue going to [place] and enjoy social gatherings with friends and family 3. Eat healthy, avoid fried foods and eat more fruits and vegetables 4. Maintain adequate blood pressure, blood sugar, and blood cholesterol level. Reducing the risk of stroke and cardiovascular disease also helps promoting better memory. 5. Avoid stressful situations. Live a simple life and avoid aggravations. Organize your time and prepare for the next day in anticipation. 6. Sleep well, avoid any interruptions of sleep and avoid any distractions in the bedroom that may interfere with adequate sleep quality 7. Avoid sugar, avoid sweets as there is a strong link between excessive sugar intake, diabetes, and cognitive impairment We discussed the Mediterranean diet, which has been shown to help patients reduce the risk of progressive memory disorders and reduces cardiovascular risk. This includes eating fish, eat fruits and green leafy vegetables, nuts like almonds and hazelnuts, walnuts,  and also use olive oil. Avoid fast foods and fried foods as much as possible. Avoid sweets and sugar as sugar use has been linked to worsening of memory function.  There is always a concern of gradual progression of memory problems. If this is the case, then we may need to adjust level of care according to patient needs. Support, both to the patient and caregiver, should then be put into place.    FALL PRECAUTIONS: Be cautious when walking. Scan the area for obstacles that may increase the risk of trips and falls. When getting up in the mornings, sit up at the edge of the bed for a few minutes before getting out of bed. Consider elevating the bed at the head end to avoid drop of blood pressure when getting up. Walk always in a well-lit room (use night lights in the walls). Avoid area rugs or power cords from appliances in the middle of the walkways. Use a walker or a cane if necessary and consider physical therapy for balance exercise. Get your eyesight checked regularly.  FINANCIAL OVERSIGHT: Supervision, especially oversight when making financial decisions or transactions is also recommended.  HOME SAFETY: Consider the safety of the kitchen when operating appliances like stoves, microwave oven, and blender. Consider having supervision and share cooking responsibilities until no longer able to participate in those. Accidents with firearms and other hazards in the house should be identified and addressed as well.   ABILITY TO BE LEFT ALONE: If patient is unable to contact 911 operator, consider using LifeLine, or when the need is there, arrange for someone to stay with patients. Smoking is a fire hazard, consider supervision or cessation. Risk of wandering should be assessed by caregiver  and if detected at any point, supervision and safe proof recommendations should be instituted.  MEDICATION SUPERVISION: Inability to self-administer medication needs to be constantly addressed. Implement a mechanism to  ensure safe administration of the medications.   DRIVING: Regarding driving, in patients with progressive memory problems, driving will be impaired. We advise to have someone else do the driving if trouble finding directions or if minor accidents are reported. Independent driving assessment is available to determine safety of driving.   If you are interested in the driving assessment, you can contact the following:  [employer] in [place] [phone]  [facility] [phone]  [facility] [phone]  [facility] [phone] or [phone]

## 2023-09-29 ENCOUNTER — Ambulatory Visit (INDEPENDENT_AMBULATORY_CARE_PROVIDER_SITE_OTHER): Payer: Medicare Other

## 2023-09-29 DIAGNOSIS — E538 Deficiency of other specified B group vitamins: Secondary | ICD-10-CM | POA: Diagnosis not present

## 2023-09-29 MED ORDER — CYANOCOBALAMIN 1000 MCG/ML IJ SOLN
1000.0000 ug | Freq: Once | INTRAMUSCULAR | Status: AC
Start: 2023-09-29 — End: 2023-09-29
  Administered 2023-09-29: 1000 ug via INTRAMUSCULAR

## 2023-09-29 NOTE — Progress Notes (Signed)
Patient is in office today for a nurse visit for B12 Injection. Patient Injection was given in the  Right deltoid. Patient tolerated injection well.

## 2023-09-30 ENCOUNTER — Other Ambulatory Visit: Payer: Self-pay | Admitting: Physician Assistant

## 2023-09-30 NOTE — Progress Notes (Signed)
[address]       [place] [address]             [phone]   PCP is [name], MD Referring Provider is [name], MD  Chief Complaint: Ascending thoracic aortic aneurysm   HPI: This is an 87 year old male with a past medical history of skin cancer, diverticular disease,thyrotoxicosis, Grave's disease, colonic polyps, back pain with radiculopathy, hypertension, hyperlipidemia, pre diabetes (HGA1C 6.5), hyperthyroidism, esophageal stricture, coronary artery disease (CABG x 20 May 2014), dementia who presents today  to establish surveillance of an incidentally found ascending thoracic aortic aneurysm. CT of the chest done in September 2024 showed mild aneurysmal dilatation of the ascending thoracic aorta measuring up to 4.2 cm. Patient denies chest pain, pressure, tightness. His wife sates he is sometimes dizzy when first getting up. He is accompanied by his wife who helps with answering questions.  Past Medical History:  Diagnosis Date   ACNE ROSACEA 06/27/2009   ACTINIC KERATOSIS, HEAD 04/18/2009   Acute maxillary sinusitis 05/14/2010   ALLERGIC RHINITIS 04/09/2007   Anal fissure 04/07/2016   B12 DEFICIENCY 06/07/2007   BACK PAIN WITH RADICULOPATHY 04/24/2008   Bilateral nephrolithiasis 07/07/2022   CT abd 06/2022 1. Nonobstructive bilateral nephrolithiasis measuring up to 5 mm on the right and 6 mm on the left.   Cancer [facility])    skin   CHEST WALL PAIN, ACUTE 06/15/2009   Chronic maxillary sinusitis 05/29/2008   COLITIS 04/27/2009   COLONIC POLYPS, HX OF 04/27/2009   tubular adenomas   Coronary artery disease 05/12/2014   Cath 05/12/2014 w/ severe 3-vessel CAD and preserved LV function, EF 55%   DERMATITIS, ATOPIC 10/12/2007   Diverticular disease 07/07/2022   CT abd 06/2022 . Colonic diverticulosis with no acute diverticulitis.   DIVERTICULOSIS, COLON 04/27/2009   ECCHYMOSES, SPONTANEOUS 06/27/2008   Elevated sedimentation rate 05/02/2009    ESOPHAGEAL STRICTURE 04/27/2009   GASTRITIS, CHRONIC 04/27/2009   HYPERLIPIDEMIA 03/13/2008   HYPERTENSION 04/09/2007   Hyperthyroidism    Iliac aneurysm (HCC) 07/07/2022   CT abd 06/2022 . Aneurysmal left common iliac artery (1.6 cm).     Internal bleeding hemorrhoids 01/22/2015   01/22/2015 Seen at anoscopy, grade 1 all 3 positions    Irritable bowel syndrome 08/11/2007   KIDNEY DISEASE 04/09/2007   NECK PAIN 09/14/2008   NEUROPATHY, IDIOPATHIC PERIPHERAL NEC 08/11/2007   OSTEOARTHRITIS, WRIST, RIGHT 08/05/2010   Postoperative delirium 05/20/2014   S/P CABG x 4 05/19/2014   LIMA to LAD, SVG to diag, SVG to OM, SVG to PDA, EVH via right thigh and leg    Past Surgical History:  Procedure Laterality Date   CARDIAC CATHETERIZATION     COLONOSCOPY     CORONARY ARTERY BYPASS GRAFT N/A 05/19/2014   Procedure: CORONARY ARTERY BYPASS GRAFTING (CABG);  Surgeon: Purcell Nails, MD;  Location: [facility] OR;  Service: Open Heart Surgery;  Laterality: N/A;  Times 4 using left internal mammary artery and endoscopically harvested right saphenous vein   ESOPHAGOGASTRODUODENOSCOPY     FINGER SURGERY     cut off end of finger   FLEXIBLE SIGMOIDOSCOPY     HEMORRHOID BANDING     HERNIA REPAIR     INCISION AND DRAINAGE WOUND WITH FOREIGN BODY REMOVAL Left 12/20/2013   Procedure: INCISION AND DRAINAGE LEFT INDEX FINGER;  Surgeon: Tami Ribas, MD;  Location: [facility] ORS;  Service: Orthopedics;  Laterality: Left;   INTRAOPERATIVE TRANSESOPHAGEAL ECHOCARDIOGRAM  N/A 05/19/2014   Procedure: INTRAOPERATIVE TRANSESOPHAGEAL ECHOCARDIOGRAM;  Surgeon: Purcell Nails, MD;  Location: [facility] OR;  Service: Open Heart Surgery;  Laterality: N/A;   lamenectomy     LEFT HEART CATHETERIZATION WITH CORONARY ANGIOGRAM N/A 05/12/2014   Procedure: LEFT HEART CATHETERIZATION WITH CORONARY ANGIOGRAM;  Surgeon: Lesleigh Noe, MD;  Location: [facility] CATH LAB;  Service: Cardiovascular;  Laterality: N/A;   LUMBAR FUSION     TONSILLECTOMY      VARICOSE VEIN SURGERY Left     Family History  Problem Relation Age of Onset   Hernia Mother    Heart disease Father        smoker   COPD Father    Heart attack Father    Heart attack Brother    Early death Daughter    Colon cancer Neg Hx    Esophageal cancer Neg Hx    Pancreatic cancer Neg Hx     Social History Social History   Tobacco Use   Smoking status: Former    Current packs/day: 0.00    Average packs/day: 0.5 packs/day for 42.0 years (21.0 ttl pk-yrs)    Types: Cigarettes    Start date: 11/17/1956    Quit date: 11/17/1998    Years since quitting: 24.8   Smokeless tobacco: Never  Vaping Use   Vaping status: Never Used  Substance Use Topics   Alcohol use: No   Drug use: No    Current Outpatient Medications  Medication Sig Dispense Refill   aspirin 81 MG EC tablet Take 1 tablet (81 mg total) by mouth daily.     donepezil (ARICEPT) 23 MG TABS tablet Take 1 tablet (23 mg total) by mouth daily. 30 tablet 11   FLUoxetine (PROZAC) 20 MG capsule Take 1 capsule (20 mg total) by mouth daily. 90 capsule 3   memantine (NAMENDA) 10 MG tablet Take 1 tablet  twice a day 60 tablet 11   methimazole (TAPAZOLE) 5 MG tablet Take 0.5 tablets (2.5 mg total) by mouth daily. 45 tablet 3   Multiple Vitamins-Minerals (CENTRUM SILVER PO) Take 1 tablet by mouth daily.     rosuvastatin (CRESTOR) 40 MG tablet Take 1 tablet (40 mg total) by mouth daily. Dose increase 01/13/22 due to heart disease and LDL goal under 70 90 tablet 3   tamsulosin (FLOMAX) 0.4 MG CAPS capsule Take 0.4 mg by mouth. Prescribed by urology     Allergies  Allergen Reactions   Lipitor [Atorvastatin] Other (See Comments)    REACTION: nausea and blurred vision   Trazodone And Nefazodone Other (See Comments)    dizzy   Ciprofloxacin Swelling   Mycophenolate Mofetil Other (See Comments)    REACTION: unspecified   Amoxicillin Rash    Has patient had a PCN reaction causing immediate rash, facial/tongue/throat swelling,  SOB or lightheadedness with hypotension: Unknown Has patient had a PCN reaction causing severe rash involving mucus membranes or skin necrosis: Unknown Has patient had a PCN reaction that required hospitalization: Unknown Has patient had a PCN reaction occurring within the last 10 years: Unknown If all of the above answers are "NO", then may proceed with Cephalosporin use.    Penicillins Rash    Has patient had a PCN reaction causing immediate rash, facial/tongue/throat swelling, SOB or lightheadedness with hypotension: Unknown Has patient had a PCN reaction causing severe rash involving mucus membranes or skin necrosis: Unknown Has patient had a PCN reaction that required hospitalization: Unknown Has patient had a PCN reaction occurring  within the last 10 years: Unknown If all of the above answers are "NO", then may proceed with Cephalosporin use.    Rosuvastatin Other (See Comments)    Unknown    Review of Systems:  Chest Pain [ N ]  Exertional SOB [ N ]  Pedal Edema [ N ] Syncope [  N]  General Review of Systems: [Y] = yes [ N]=no  Consitutional:   nausea [ N];  fever Klaus.Mock ];  Resp: cough Klaus.Mock ];  hemoptysis[N ];  GI: vomiting[ N]; melena[N ]; hematochezia [N];  [id number][ N]; Heme/Lymph: anemia[ N];  Neuro: TIA[N ];stroke[N ];  seizures[N ];   Vital Signs: Vitals:   10/05/23 1447  BP: (!) 95/49  Pulse: (!) 45  Resp: 20  SpO2: 95%    Physical Exam: CV-RRR, no murmur Neck-No carotid bruit Pulmonary-Clear to auscultation bilaterally Abdomen-Soft, non tender, bowel sounds present Extremities-No LE edema  Diagnostic Test: Narrative & Impression  CLINICAL DATA:  Intermittent right-sided rib pain for the past 3-4 years.   EXAM: CT CHEST WITHOUT CONTRAST   TECHNIQUE: Multidetector CT imaging of the chest was performed following the standard protocol without IV contrast.   RADIATION DOSE REDUCTION: This exam was performed according to the departmental  dose-optimization program which includes automated exposure control, adjustment of the mA and/or kV according to patient size and/or use of iterative reconstruction technique.   COMPARISON:  Chest x-ray dated December 31, 2022.   FINDINGS: Cardiovascular: Borderline cardiomegaly status post CABG. No pericardial effusion. Mild aneurysmal dilatation of the ascending thoracic aorta measuring up to 4.2 cm in diameter. Coronary, aortic arch, and branch vessel atherosclerotic vascular disease.   Mediastinum/Nodes: No enlarged mediastinal or axillary lymph nodes. Thyroid gland, trachea, and esophagus demonstrate no significant findings.   Lungs/Pleura: Biapical pleuroparenchymal scarring. Two calcified pleural plaques in the posterior right upper lobe. Mild centrilobular and paraseptal emphysema. No focal consolidation, pleural effusion, or pneumothorax.   Upper Abdomen: No acute abnormality. Punctate nonobstructive left renal calculi.   Musculoskeletal: No chest wall mass or suspicious bone lesions identified. The ribs are unremarkable.   IMPRESSION: 1. No acute intrathoracic process. No rib abnormality. 2. Mild aneurysmal dilatation of the ascending thoracic aorta measuring up to 4.2 cm. Recommend annual imaging followup by CTA or MRA. This recommendation follows 2010 ACCF/AHA/AATS/ACR/ASA/SCA/SCAI/SIR/STS/SVM Guidelines for the Diagnosis and Management of Patients with Thoracic Aortic Disease. Circulation. 2010; 121: J811-B147. Aortic aneurysm NOS (ICD10-I71.9) 3. Aortic Atherosclerosis (ICD10-I70.0) and Emphysema (ICD10-J43.9).     Electronically Signed   By: Obie Dredge M.D.   On: 08/26/2023 09:18     Impression and Plan: I discussed with wife his BP seems a little low compared to office visits with other providers. She has a BP cuff and will keep a record. She was asked to contact PCP if continues to be labile and/or dizziness continues. CTA with a 4.2 cm ascending  aortic aneurysm.  Echocardiogram done in July 2015 showed a tri leaflet valve with evidence of trivial aortic regurgitation.  We discussed the natural history and and risk factors for growth of ascending aortic aneurysms.  We covered the importance of smoking cessation, tight blood pressure control, refraining from lifting heavy objects, and avoiding fluoroquinolones.  The patient is aware of signs and symptoms of aortic dissection and when to present to the emergency department.  We will continue surveillance and a repeat CT of the chest without contrast was ordered for 1 year.     Ardelle Balls, PA-C [facility] [phone]

## 2023-10-05 ENCOUNTER — Institutional Professional Consult (permissible substitution): Payer: Medicare Other | Admitting: Physician Assistant

## 2023-10-05 VITALS — BP 95/49 | HR 45 | Resp 20 | Ht 71.0 in | Wt 199.0 lb

## 2023-10-05 DIAGNOSIS — I7121 Aneurysm of the ascending aorta, without rupture: Secondary | ICD-10-CM | POA: Diagnosis not present

## 2023-10-05 NOTE — Patient Instructions (Signed)
Risk Modification in those with ascending thoracic aortic aneurysm:  Continue good control of blood pressure (prefer SBP 130/80 or less)-will defer to PCP for further monitoring  2. Avoid fluoroquinolone antibiotics (I.e Ciprofloxacin, Avelox, Levofloxacin, Ofloxacin)  3.  Use of statin (to decrease cardiovascular risk)-continue Crestor.  4.  Exercise and activity limitations is individualized, but in general, contact sports are to be  avoided and one should avoid heavy lifting (defined as half of ideal body weight) and exercises involving sustained Valsalva maneuver.  5. Counseling for those suspected of having genetically mediated disease. First-degree relatives of those with TAA disease should be screened as well as those who have a connective tissue disease (I.e with Marfan syndrome, Ehlers-Danlos syndrome,  and Loeys-Dietz syndrome) or a  bicuspid aortic valve,have an increased risk for complications related to TAA. Patient does not have a history of connective tissue disease in the Family. Echo done in 2015 showed trivial aortic regurgitation and the aortic valve is tri cuspid.  6. He has no history of tobacco abuse

## 2023-10-20 ENCOUNTER — Ambulatory Visit (INDEPENDENT_AMBULATORY_CARE_PROVIDER_SITE_OTHER): Payer: Medicare Other

## 2023-10-20 VITALS — Wt 199.0 lb

## 2023-10-20 DIAGNOSIS — Z0000 Encounter for general adult medical examination without abnormal findings: Secondary | ICD-10-CM | POA: Diagnosis not present

## 2023-10-20 NOTE — Progress Notes (Signed)
Subjective:   Timothy Gallagher is a 87 y.o. male who presents for Medicare Annual/Subsequent preventive examination.  Visit Complete: Virtual I connected with  Tavari A Briguglio on 10/20/23 by a audio enabled telemedicine application and verified that I am speaking with the correct person using two identifiers.  Patient Location: Home  Provider Location: Home Office  I discussed the limitations of evaluation and management by telemedicine. The patient expressed understanding and agreed to proceed.  Vital Signs: Because this visit was a virtual/telehealth visit, some criteria may be missing or patient reported. Any vitals not documented were not able to be obtained and vitals that have been documented are patient reported.  Cardiac Risk Factors include: advanced age (>23men, >69 women);hypertension;dyslipidemia;male gender     Objective:    Today's Vitals   10/20/23 1400  Weight: 199 lb (90.3 kg)   Body mass index is 27.75 kg/m.     10/20/2023    2:04 PM 09/09/2023    9:27 AM 03/10/2023    8:54 AM 10/16/2022    1:56 PM 09/04/2022    9:27 AM 03/05/2022    9:17 AM 11/28/2021    1:27 PM  Advanced Directives  Does Patient Have a Medical Advance Directive? No Yes Yes No No No Yes  Type of Advance Directive  Healthcare Power of [employer] Power of Attorney      Does patient want to make changes to medical advance directive?  No - Patient declined No - Patient declined      Copy of Healthcare Power of Attorney in Chart?  No - copy requested No - copy requested      Would patient like information on creating a medical advance directive? No - Patient declined   No - Patient declined       Current Medications (verified) Outpatient Encounter Medications as of 10/20/2023  Medication Sig   aspirin 81 MG EC tablet Take 1 tablet (81 mg total) by mouth daily.   donepezil (ARICEPT) 23 MG TABS tablet Take 1 tablet (23 mg total) by mouth daily.   FLUoxetine (PROZAC) 20 MG capsule Take  1 capsule (20 mg total) by mouth daily.   memantine (NAMENDA) 10 MG tablet Take 1 tablet  twice a day   methimazole (TAPAZOLE) 5 MG tablet Take 0.5 tablets (2.5 mg total) by mouth daily.   Multiple Vitamins-Minerals (CENTRUM SILVER PO) Take 1 tablet by mouth daily.   rosuvastatin (CRESTOR) 40 MG tablet Take 1 tablet (40 mg total) by mouth daily. Dose increase 01/13/22 due to heart disease and LDL goal under 70   tamsulosin (FLOMAX) 0.4 MG CAPS capsule Take 0.4 mg by mouth. Prescribed by urology   No facility-administered encounter medications on file as of 10/20/2023.    Allergies (verified) Lipitor [atorvastatin], Trazodone and nefazodone, Ciprofloxacin, Mycophenolate mofetil, Amoxicillin, Penicillins, and Rosuvastatin   History: Past Medical History:  Diagnosis Date   ACNE ROSACEA 06/27/2009   ACTINIC KERATOSIS, HEAD 04/18/2009   Acute maxillary sinusitis 05/14/2010   ALLERGIC RHINITIS 04/09/2007   Anal fissure 04/07/2016   B12 DEFICIENCY 06/07/2007   BACK PAIN WITH RADICULOPATHY 04/24/2008   Bilateral nephrolithiasis 07/07/2022   CT abd 06/2022 1. Nonobstructive bilateral nephrolithiasis measuring up to 5 mm on the right and 6 mm on the left.   Cancer [facility])    skin   CHEST WALL PAIN, ACUTE 06/15/2009   Chronic maxillary sinusitis 05/29/2008   COLITIS 04/27/2009   COLONIC POLYPS, HX OF 04/27/2009   tubular adenomas  Coronary artery disease 05/12/2014   Cath 05/12/2014 w/ severe 3-vessel CAD and preserved LV function, EF 55%   DERMATITIS, ATOPIC 10/12/2007   Diverticular disease 07/07/2022   CT abd 06/2022 . Colonic diverticulosis with no acute diverticulitis.   DIVERTICULOSIS, COLON 04/27/2009   ECCHYMOSES, SPONTANEOUS 06/27/2008   Elevated sedimentation rate 05/02/2009   ESOPHAGEAL STRICTURE 04/27/2009   GASTRITIS, CHRONIC 04/27/2009   HYPERLIPIDEMIA 03/13/2008   HYPERTENSION 04/09/2007   Hyperthyroidism    Iliac aneurysm (HCC) 07/07/2022   CT abd 06/2022 . Aneurysmal left  common iliac artery (1.6 cm).     Internal bleeding hemorrhoids 01/22/2015   01/22/2015 Seen at anoscopy, grade 1 all 3 positions    Irritable bowel syndrome 08/11/2007   KIDNEY DISEASE 04/09/2007   NECK PAIN 09/14/2008   NEUROPATHY, IDIOPATHIC PERIPHERAL NEC 08/11/2007   OSTEOARTHRITIS, WRIST, RIGHT 08/05/2010   Postoperative delirium 05/20/2014   S/P CABG x 4 05/19/2014   LIMA to LAD, SVG to diag, SVG to OM, SVG to PDA, EVH via right thigh and leg   Past Surgical History:  Procedure Laterality Date   CARDIAC CATHETERIZATION     COLONOSCOPY     CORONARY ARTERY BYPASS GRAFT N/A 05/19/2014   Procedure: CORONARY ARTERY BYPASS GRAFTING (CABG);  Surgeon: Purcell Nails, MD;  Location: [facility] OR;  Service: Open Heart Surgery;  Laterality: N/A;  Times 4 using left internal mammary artery and endoscopically harvested right saphenous vein   ESOPHAGOGASTRODUODENOSCOPY     FINGER SURGERY     cut off end of finger   FLEXIBLE SIGMOIDOSCOPY     HEMORRHOID BANDING     HERNIA REPAIR     INCISION AND DRAINAGE WOUND WITH FOREIGN BODY REMOVAL Left 12/20/2013   Procedure: INCISION AND DRAINAGE LEFT INDEX FINGER;  Surgeon: Tami Ribas, MD;  Location: [facility] ORS;  Service: Orthopedics;  Laterality: Left;   INTRAOPERATIVE TRANSESOPHAGEAL ECHOCARDIOGRAM N/A 05/19/2014   Procedure: INTRAOPERATIVE TRANSESOPHAGEAL ECHOCARDIOGRAM;  Surgeon: Purcell Nails, MD;  Location: [facility] OR;  Service: Open Heart Surgery;  Laterality: N/A;   lamenectomy     LEFT HEART CATHETERIZATION WITH CORONARY ANGIOGRAM N/A 05/12/2014   Procedure: LEFT HEART CATHETERIZATION WITH CORONARY ANGIOGRAM;  Surgeon: Lesleigh Noe, MD;  Location: [facility] CATH LAB;  Service: Cardiovascular;  Laterality: N/A;   LUMBAR FUSION     TONSILLECTOMY     VARICOSE VEIN SURGERY Left    Family History  Problem Relation Age of Onset   Hernia Mother    Heart disease Father        smoker   COPD Father    Heart attack Father    Heart attack Brother    Early death  Daughter    Colon cancer Neg Hx    Esophageal cancer Neg Hx    Pancreatic cancer Neg Hx    Social History   Socioeconomic History   Marital status: Married    Spouse name: Not on file   Number of children: 3   Years of education: 11   Highest education level: Not on file  Occupational History   Occupation: retired    Associate Professor: RETIRED    Comment: from [employer]  Tobacco Use   Smoking status: Former    Current packs/day: 0.00    Average packs/day: 0.5 packs/day for 42.0 years (21.0 ttl pk-yrs)    Types: Cigarettes    Start date: 11/17/1956    Quit date: 11/17/1998    Years since quitting: 24.9   Smokeless tobacco: Never  Vaping Use   Vaping status: Never Used  Substance and Sexual Activity   Alcohol use: No   Drug use: No   Sexual activity: Yes  Other Topics Concern   Not on file  Social History Narrative   Married 35 years (2nd marriage). 2 kids from previous marriage (lost a 3rd child hit by car at age 5). 2 stepchildren. 6 grandkids. 2 greatgrandkids.       Retired from [employer]      Hobbies: walking/exercise, building in shop-furniture (cut finger off in February doing this)   6 grand children - Has bought them all a new car    Brother died of probably addiction   Sister also died; not sure of cause    He enjoys his life; Likes to play the keyboard; guitar    Had played in a gospel quartet    Enjoys working with wood    Right handed   Drinks caffeine   Social Determinants of Health   Financial Resource Strain: Low Risk  (10/20/2023)   Overall Financial Resource Strain (CARDIA)    Difficulty of Paying Living Expenses: Not hard at all  Food Insecurity: No Food Insecurity (10/20/2023)   Hunger Vital Sign    Worried About Running Out of Food in the Last Year: Never true    Ran Out of Food in the Last Year: Never true  Transportation Needs: No Transportation Needs (10/20/2023)   PRAPARE - Administrator, Civil Service (Medical): No     Lack of Transportation (Non-Medical): No  Physical Activity: Inactive (10/20/2023)   Exercise Vital Sign    Days of Exercise per Week: 0 days    Minutes of Exercise per Session: 0 min  Stress: No Stress Concern Present (10/20/2023)   [employer] of Occupational Health - Occupational Stress Questionnaire    Feeling of Stress : Not at all  Social Connections: Moderately Integrated (10/20/2023)   Social Connection and Isolation Panel [NHANES]    Frequency of Communication with Friends and Family: More than three times a week    Frequency of Social Gatherings with Friends and Family: More than three times a week    Attends Religious Services: More than 4 times per year    Active Member of [employer] or Organizations: No    Attends Engineer, structural: Never    Marital Status: Married    Tobacco Counseling Counseling given: Not Answered   Clinical Intake:  Pre-visit preparation completed: Yes  Pain : No/denies pain     BMI - recorded: 27.75 Nutritional Status: BMI 25 -29 Overweight Diabetes: No  How often do you need to have someone help you when you read instructions, pamphlets, or other written materials from your doctor or pharmacy?: 1 - Never  Interpreter Needed?: No  Information entered by :: Lanier Ensign, LPN   Activities of Daily Living    10/20/2023    2:02 PM  In your present state of health, do you have any difficulty performing the following activities:  Hearing? 0  Vision? 0  Difficulty concentrating or making decisions? 0  Walking or climbing stairs? 0  Dressing or bathing? 0  Doing errands, shopping? 0  Preparing Food and eating ? N  Using the Toilet? N  In the past six months, have you accidently leaked urine? N  Do you have problems with loss of bowel control? N  Managing your Medications? N  Managing your Finances? N  Housekeeping or managing your Housekeeping?  N    Patient Care Team: [name], MD as PCP - General (Family  Medicine) Lyn Records, MD (Inactive) as PCP - Cardiology (Cardiology) Alfredo Martinez, MD as Consulting Physician (Urology) Genia Del, Daisy Blossom, MD as Consulting Physician (Ophthalmology) Romero Belling, MD (Inactive) as Consulting Physician (Endocrinology) Marykay Lex, MD as Consulting Physician (Cardiology) Elwyn Reach (Neurology)  Indicate any recent Medical Services you may have received from other than [facility] providers in the past year (date may be approximate).     Assessment:   This is a routine wellness examination for Gaege.  Hearing/Vision screen Hearing Screening - Comments:: Pt denies HOH  Vision Screening - Comments:: Pt follows up with Dr Genia Del for annual eye exams    Goals Addressed             This Visit's Progress    Patient Stated       Maintain health and activity        Depression Screen    10/20/2023    2:05 PM 12/31/2022   10:41 AM 10/16/2022    1:55 PM 06/19/2022    4:18 PM 10/03/2021    1:44 PM 05/16/2021    8:36 AM 01/01/2021   11:21 AM  PHQ 2/9 Scores  PHQ - 2 Score 0 1 0 0 0 0 0  PHQ- 9 Score  2         Fall Risk    10/20/2023    2:07 PM 09/09/2023    9:27 AM 03/10/2023    8:53 AM 12/31/2022   10:41 AM 12/18/2022    9:50 AM  Fall Risk   Falls in the past year? 0 0 0 0 0  Number falls in past yr: 0 0 0 0 0  Injury with Fall? 0 0 0 0 0  Risk for fall due to : No Fall Risks   No Fall Risks No Fall Risks  Follow up Falls prevention discussed Falls evaluation completed Falls evaluation completed Falls evaluation completed Falls evaluation completed    MEDICARE RISK AT HOME: Medicare Risk at Home Any stairs in or around the home?: No If so, are there any without handrails?: No Home free of loose throw rugs in walkways, pet beds, electrical cords, etc?: Yes Adequate lighting in your home to reduce risk of falls?: Yes Life alert?: No Use of a cane, walker or w/c?: No Grab bars in the bathroom?: No Shower chair or bench  in shower?: No Elevated toilet seat or a handicapped toilet?: No  TIMED UP AND GO:  Was the test performed?  No    Cognitive Function:    10/20/2023    2:07 PM 09/09/2023   10:00 AM 03/10/2023    9:00 AM 09/04/2022    9:00 AM 11/27/2021    9:24 AM  MMSE - Mini Mental State Exam  Not completed: Unable to complete      Orientation to time  1 2 3 2   Orientation to Place  2 3 5 4   Registration  3 3 3 3   Attention/ Calculation  0 0 5 3  Recall  0 1 0 0  Language- name 2 objects  2 2 2 2   Language- repeat  1 1 1 1   Language- follow 3 step command  3 3 3 3   Language- read & follow direction  1 1 1 1   Write a sentence  1 1 0 1  Copy design  0 0 0 0  Total score  14 17 23 20         10/16/2022    1:58 PM 10/03/2021    1:49 PM 06/14/2018    9:14 AM  6CIT Screen  What Year? 4 points 4 points 0 points  What month? 0 points 0 points 0 points  What time? 0 points 0 points 0 points  Count back from 20 0 points 0 points 0 points  Months in reverse 4 points 4 points 0 points  Repeat phrase 2 points 10 points   Total Score 10 points 18 points     Immunizations Immunization History  Administered Date(s) Administered   Fluad Quad(high Dose 65+) 07/28/2019, 08/22/2020   Fluad Trivalent(High Dose 65+) 08/27/2023   Influenza Split 08/11/2011, 08/05/2012   Influenza Whole 08/31/2007, 08/15/2008, 08/20/2009, 08/05/2010   Influenza, High Dose Seasonal PF 07/28/2016, 09/10/2017, 08/13/2018, 08/23/2021   Influenza,inj,Quad PF,6+ Mos 07/15/2013, 07/20/2014, 08/10/2015   Influenza,inj,quad, With Preservative 08/17/2018   Influenza-Unspecified 08/26/2022   PFIZER(Purple Top)SARS-COV-2 Vaccination 12/02/2019, 12/22/2019, 08/28/2020, 08/26/2022   Pfizer Covid-19 Vaccine Bivalent Booster 57yrs & up 10/16/2021   Pneumococcal Conjugate-13 11/04/2013   Pneumococcal Polysaccharide-23 02/05/2015   Td 03/18/2007   Tdap 06/04/2018   Zoster Recombinant(Shingrix) 08/15/2019, 10/31/2019   Zoster, Live  04/16/2011    TDAP status: Up to date  Flu Vaccine status: Up to date  Pneumococcal vaccine status: Up to date  Covid-19 vaccine status: Information provided on how to obtain vaccines.   Qualifies for Shingles Vaccine? Yes   Zostavax completed Yes   Shingrix Completed?: Yes  Screening Tests Health Maintenance  Topic Date Due   COVID-19 Vaccine (6 - 2023-24 season) 07/19/2023   Medicare Annual Wellness (AWV)  10/19/2024   DTaP/Tdap/Td (3 - Td or Tdap) 06/04/2028   Pneumonia Vaccine 15+ Years old  Completed   INFLUENZA VACCINE  Completed   Zoster Vaccines- Shingrix  Completed   HPV VACCINES  Aged Out    Health Maintenance  Health Maintenance Due  Topic Date Due   COVID-19 Vaccine (6 - 2023-24 season) 07/19/2023    Colorectal cancer screening: No longer required.    Additional Screening:   Vision Screening: Recommended annual ophthalmology exams for early detection of glaucoma and other disorders of the eye. Is the patient up to date with their annual eye exam?  No  Who is the provider or what is the name of the office in which the patient attends annual eye exams? Pt request referral  If pt is not established with a provider, would they like to be referred to a provider to establish care? Yes .   Dental Screening: Recommended annual dental exams for proper oral hygiene   Community Resource Referral / Chronic Care Management: CRR required this visit?  No   CCM required this visit?  No     Plan:     I have personally reviewed and noted the following in the patient's chart:   Medical and social history Use of alcohol, tobacco or illicit drugs  Current medications and supplements including opioid prescriptions. Patient is not currently taking opioid prescriptions. Functional ability and status Nutritional status Physical activity Advanced directives List of other physicians Hospitalizations, surgeries, and ER visits in previous 12  months Vitals Screenings to include cognitive, depression, and falls Referrals and appointments  In addition, I have reviewed and discussed with patient certain preventive protocols, quality metrics, and best practice recommendations. A written personalized care plan for preventive services as well as general preventive health recommendations were provided to patient.  Marzella Schlein, LPN   95/12/8411   After Visit Summary: (Declined) Due to this being a telephonic visit, with patients personalized plan was offered to patient but patient Declined AVS at this time   Nurse Notes: none

## 2023-10-20 NOTE — Patient Instructions (Signed)
Timothy Gallagher , Thank you for taking time to come for your Medicare Wellness Visit. I appreciate your ongoing commitment to your health goals. Please review the following plan we discussed and let me know if I can assist you in the future.   Referrals/Orders/Follow-Ups/Clinician Recommendations: mainatain health and activity   This is a list of the screening recommended for you and due dates:  Health Maintenance  Topic Date Due   COVID-19 Vaccine (6 - 2023-24 season) 07/19/2023   Medicare Annual Wellness Visit  10/19/2024   DTaP/Tdap/Td vaccine (3 - Td or Tdap) 06/04/2028   Pneumonia Vaccine  Completed   Flu Shot  Completed   Zoster (Shingles) Vaccine  Completed   HPV Vaccine  Aged Out    Advanced directives: (Declined) Advance directive discussed with you today. Even though you declined this today, please call our office should you change your mind, and we can give you the proper paperwork for you to fill out.  Next Medicare Annual Wellness Visit scheduled for next year: Yes

## 2023-11-20 ENCOUNTER — Telehealth: Payer: Self-pay

## 2023-11-23 ENCOUNTER — Ambulatory Visit: Payer: Medicare Other | Attending: Cardiology | Admitting: Cardiology

## 2023-11-23 ENCOUNTER — Encounter: Payer: Self-pay | Admitting: Cardiology

## 2023-11-23 VITALS — BP 126/72 | HR 47 | Ht 74.0 in | Wt 185.8 lb

## 2023-11-23 DIAGNOSIS — I25810 Atherosclerosis of coronary artery bypass graft(s) without angina pectoris: Secondary | ICD-10-CM | POA: Diagnosis not present

## 2023-11-23 DIAGNOSIS — I10 Essential (primary) hypertension: Secondary | ICD-10-CM | POA: Diagnosis not present

## 2023-11-23 DIAGNOSIS — I7143 Infrarenal abdominal aortic aneurysm, without rupture: Secondary | ICD-10-CM

## 2023-11-23 DIAGNOSIS — E785 Hyperlipidemia, unspecified: Secondary | ICD-10-CM | POA: Diagnosis not present

## 2023-11-23 DIAGNOSIS — R42 Dizziness and giddiness: Secondary | ICD-10-CM

## 2023-11-23 DIAGNOSIS — I723 Aneurysm of iliac artery: Secondary | ICD-10-CM

## 2023-11-23 NOTE — Progress Notes (Signed)
 Cardiology Office Note:  .   Date:  11/23/2023  ID:  Timothy Gallagher, DOB June 22, 1936, MRN [id number] PCP: Timothy Garnette KIDD, MD  [facility] Providers Cardiologist:  Oneil Parchment, MD     History of Present Illness: .   Timothy Gallagher is a 88 y.o. male Discussed with the use of AI scribe   History of Present Illness   The patient is an 88 year old male with a history of abdominal aortic aneurysm (3.7cm), coronary artery disease, hypertension, hyperlipidemia, and stroke, memory impairment. He underwent CABG in 2015 Dr. Dusty and has been under surveillance for his aortic conditions.   Carotid dopplers performed in 2023 showed mild stenosis bilaterally.    An ultrasound of the abdominal aorta showed a dimension of 3.6 cm, and a CT scan of the abdomen in the same year showed an infrarenal abdominal aortic aneurysm of 3.7 by 3.6 cm.    A CT of the chest without contrast in 2024 showed a mildly dilated thoracic aorta at 4.2 cm. The patient has been under the care of cardiothoracic surgery for a trileaflet aortic valve noted on a prior echocardiogram.  The patient has been experiencing mild dizziness, particularly upon standing after squatting. Despite this, he reports feeling fine overall, wife present for history.    He is currently on aspirin  81 mg, rosuvastatin  40 mg, Aricept  and Namenda  for memory impairment, and methimazole  2.5 mg daily for hyperthyroidism. His last TSH was 1.5, hemoglobin A1c was 6.5, and LDL was 35. He has not reported any new symptoms or changes in his condition.        Studies Reviewed: [name]   EKG Interpretation Date/Time:  Monday November 23 2023 10:35:23 EST Ventricular Rate:  77 PR Interval:  176 QRS Duration:  92 QT Interval:  398 QTC Calculation: 450 R Axis:   70  Text Interpretation: Normal sinus rhythm Incomplete right bundle branch block When compared with ECG of 31-Dec-2019 07:33, No significant change since last tracing (QRS < duration)  Confirmed by Parchment Oneil ([id number]) on 11/23/2023 10:41:26 AM    Results   LABS TSH: 1.5 Hemoglobin A1c: 6.5 LDL: 35  RADIOLOGY Carotid Doppler: Mild stenosis bilaterally (05/06/2022) Abdominal Aorta Ultrasound: Largest dimension 3.6 cm (01/23/2021) Abdominal CT: Infrarenal abdominal aortic aneurysm 3.7 x 3.6 cm (07/03/2022) Chest CT without contrast: Thoracic aorta 4.2 cm, mildly dilated (08/13/2023)  DIAGNOSTIC Echocardiogram: Trileaflet aortic valve (10/05/2023) EKG: Normal     Risk Assessment/Calculations:            Physical Exam:   VS:  BP 126/72   Pulse (!) 47   Ht 6' 2 (1.88 m)   Wt 185 lb 12.8 oz (84.3 kg)   SpO2 98%   BMI 23.86 kg/m    Wt Readings from Last 3 Encounters:  11/23/23 185 lb 12.8 oz (84.3 kg)  10/20/23 199 lb (90.3 kg)  10/05/23 199 lb (90.3 kg)    GEN: Well nourished, well developed in no acute distress NECK: No JVD; No carotid bruits CARDIAC: RRR, no murmurs, no rubs, no gallops RESPIRATORY:  Clear to auscultation without rales, wheezing or rhonchi  ABDOMEN: Soft, non-tender, non-distended EXTREMITIES:  No edema; No deformity   ASSESSMENT AND PLAN: .    Assessment and Plan    Abdominal Aortic Aneurysm Infrarenal abdominal aortic aneurysm measuring 3.7 x 3.6 cm on CT (07/03/2022). Size is stable; no surgical intervention required. Discussed regular monitoring to prevent complications such as rupture. - Repeat ultrasound in two years  Thoracic Aortic Dilation Mildly dilated thoracic aorta measuring 4.2 cm on CT (08/13/2023). Discussed risks of expansion and rupture, and need for annual surveillance. - Order repeat CT of the chest without contrast in one year as directed by CT surgery clinic.  Coronary Artery Disease (CAD) CAD with CABG in 2015 (LIMA to LAD, SVG to PDA, SVG to OM, SVG to diagonal). Asymptomatic, on aspirin  and rosuvastatin . Emphasized medication adherence to prevent cardiac events. - Continue aspirin  81 mg daily, rosuvastatin   40 mg daily  Hypertension Hypertension managed with current medications. Emphasized blood pressure control to reduce cardiovascular risk. - Continue current antihypertensive therapy  Hyperlipidemia Hyperlipidemia managed with rosuvastatin  40 mg daily. LDL 35 mg/dL, indicating good control. Discussed benefits of maintaining low LDL levels. - Continue rosuvastatin  40 mg daily  History of Stroke History of stroke. No new neurological symptoms. On aspirin  for secondary prevention. Emphasized continued aspirin  therapy to reduce recurrent stroke risk. - Continue aspirin  81 mg daily  Hyperthyroidism Hyperthyroidism managed with methimazole  2.5 mg daily. TSH 1.5, indicating good control. Discussed need for regular thyroid  function tests. - Continue methimazole  2.5 mg daily  Memory Impairment Memory impairment managed with Aricept  and Namenda . Emphasized medication adherence to manage symptoms. - Continue Aricept  and Namenda   General Health Maintenance Reports occasional dizziness when standing up after squatting. Advised caution to prevent falls. - Advise caution when standing up from a squatting position to prevent dizziness  Follow-up - Schedule follow-up in one year with an APP - 2 years with me               Signed, Oneil Parchment, MD

## 2023-11-23 NOTE — Patient Instructions (Signed)
 Medication Instructions:  The current medical regimen is effective;  continue present plan and medications.  *If you need a refill on your cardiac medications before your next appointment, please call your pharmacy*   Follow-Up: At [facility], you and your health needs are our priority.  As part of our continuing mission to provide you with exceptional heart care, we have created designated Provider Care Teams.  These Care Teams include your primary Cardiologist (physician) and Advanced Practice Providers (APPs -  Physician Assistants and Nurse Practitioners) who all work together to provide you with the care you need, when you need it.  We recommend signing up for the patient portal called MyChart.  Sign up information is provided on this After Visit Summary.  MyChart is used to connect with patients for Virtual Visits (Telemedicine).  Patients are able to view lab/test results, encounter notes, upcoming appointments, etc.  Non-urgent messages can be sent to your provider as well.   To learn more about what you can do with MyChart, go to [email or web address].    Your next appointment:   1 year(s)  Provider:   Orren Fabry, PA-C, Jackee Alberts, NP, Rosaline Bane, NP, Glendia Ferrier, PA-C, or Artist Pouch, PA-C     Then, Dr Oneil Parchment will plan to see you again in 2 year(s).

## 2023-11-23 NOTE — Telephone Encounter (Signed)
 Med Rec complete, allergies and Pharmacy verified November 20 2023.

## 2023-12-01 ENCOUNTER — Other Ambulatory Visit: Payer: Self-pay | Admitting: Family Medicine

## 2023-12-08 DIAGNOSIS — H52223 Regular astigmatism, bilateral: Secondary | ICD-10-CM | POA: Diagnosis not present

## 2023-12-17 ENCOUNTER — Ambulatory Visit: Payer: Medicare Other

## 2023-12-17 DIAGNOSIS — E538 Deficiency of other specified B group vitamins: Secondary | ICD-10-CM

## 2023-12-17 MED ORDER — CYANOCOBALAMIN 1000 MCG/ML IJ SOLN
1000.0000 ug | Freq: Once | INTRAMUSCULAR | Status: AC
Start: 2023-12-17 — End: 2023-12-17
  Administered 2023-12-17: 1000 ug via INTRAMUSCULAR

## 2023-12-17 NOTE — Progress Notes (Signed)
Patient was given the b12 injection in the right arm today. Patient tolerated injection well. Donzetta Starch, CMA

## 2023-12-24 ENCOUNTER — Telehealth: Payer: Self-pay | Admitting: Physician Assistant

## 2023-12-24 NOTE — Telephone Encounter (Signed)
 Patients wife said she talked with husbands insurance company about his donepezil  23mg . The price has went up and they can't afford it. The insurance company was supposed to reach out to [employer] about changing the medication or getting help for the current one he is on. He has not had the medication for 3 days.

## 2023-12-25 ENCOUNTER — Other Ambulatory Visit: Payer: Self-pay | Admitting: Physician Assistant

## 2023-12-25 MED ORDER — DONEPEZIL HCL 10 MG PO TABS: 10.0000 mg | ORAL_TABLET | Freq: Every day | ORAL | 11 refills | Status: DC

## 2023-12-25 NOTE — Telephone Encounter (Signed)
I called patient an advised.

## 2024-01-12 ENCOUNTER — Encounter: Payer: Self-pay | Admitting: Family Medicine

## 2024-01-12 ENCOUNTER — Ambulatory Visit (INDEPENDENT_AMBULATORY_CARE_PROVIDER_SITE_OTHER): Payer: Medicare Other | Admitting: Family Medicine

## 2024-01-12 VITALS — BP 128/80 | HR 82 | Temp 97.9°F | Ht 74.0 in | Wt 185.0 lb

## 2024-01-12 DIAGNOSIS — R809 Proteinuria, unspecified: Secondary | ICD-10-CM | POA: Diagnosis not present

## 2024-01-12 DIAGNOSIS — R739 Hyperglycemia, unspecified: Secondary | ICD-10-CM | POA: Diagnosis not present

## 2024-01-12 DIAGNOSIS — E538 Deficiency of other specified B group vitamins: Secondary | ICD-10-CM

## 2024-01-12 DIAGNOSIS — J449 Chronic obstructive pulmonary disease, unspecified: Secondary | ICD-10-CM

## 2024-01-12 DIAGNOSIS — I10 Essential (primary) hypertension: Secondary | ICD-10-CM | POA: Diagnosis not present

## 2024-01-12 DIAGNOSIS — Z131 Encounter for screening for diabetes mellitus: Secondary | ICD-10-CM

## 2024-01-12 DIAGNOSIS — Z0000 Encounter for general adult medical examination without abnormal findings: Secondary | ICD-10-CM

## 2024-01-12 DIAGNOSIS — F0150 Vascular dementia without behavioral disturbance: Secondary | ICD-10-CM

## 2024-01-12 DIAGNOSIS — H16292 Other keratoconjunctivitis, left eye: Secondary | ICD-10-CM | POA: Diagnosis not present

## 2024-01-12 DIAGNOSIS — E785 Hyperlipidemia, unspecified: Secondary | ICD-10-CM

## 2024-01-12 DIAGNOSIS — E0590 Thyrotoxicosis, unspecified without thyrotoxic crisis or storm: Secondary | ICD-10-CM

## 2024-01-12 DIAGNOSIS — H16012 Central corneal ulcer, left eye: Secondary | ICD-10-CM | POA: Diagnosis not present

## 2024-01-12 LAB — URINALYSIS, ROUTINE W REFLEX MICROSCOPIC
Bilirubin Urine: NEGATIVE
Hgb urine dipstick: NEGATIVE
Leukocytes,Ua: NEGATIVE
Nitrite: NEGATIVE
Specific Gravity, Urine: 1.02 (ref 1.000–1.030)
Total Protein, Urine: 100 — AB
Urine Glucose: NEGATIVE
Urobilinogen, UA: 1 (ref 0.0–1.0)
pH: 7 (ref 5.0–8.0)

## 2024-01-12 LAB — CBC WITH DIFFERENTIAL/PLATELET
Basophils Absolute: 0 10*3/uL (ref 0.0–0.1)
Basophils Relative: 0.8 % (ref 0.0–3.0)
Eosinophils Absolute: 0.1 10*3/uL (ref 0.0–0.7)
Eosinophils Relative: 2.8 % (ref 0.0–5.0)
HCT: 38.6 % — ABNORMAL LOW (ref 39.0–52.0)
Hemoglobin: 12.3 g/dL — ABNORMAL LOW (ref 13.0–17.0)
Lymphocytes Relative: 28.1 % (ref 12.0–46.0)
Lymphs Abs: 1.5 10*3/uL (ref 0.7–4.0)
MCHC: 32 g/dL (ref 30.0–36.0)
MCV: 85.9 fL (ref 78.0–100.0)
Monocytes Absolute: 0.5 10*3/uL (ref 0.1–1.0)
Monocytes Relative: 8.8 % (ref 3.0–12.0)
Neutro Abs: 3.1 10*3/uL (ref 1.4–7.7)
Neutrophils Relative %: 59.5 % (ref 43.0–77.0)
Platelets: 179 10*3/uL (ref 150.0–400.0)
RBC: 4.49 Mil/uL (ref 4.22–5.81)
RDW: 16.1 % — ABNORMAL HIGH (ref 11.5–15.5)
WBC: 5.3 10*3/uL (ref 4.0–10.5)

## 2024-01-12 LAB — COMPREHENSIVE METABOLIC PANEL
ALT: 11 U/L (ref 0–53)
AST: 19 U/L (ref 0–37)
Albumin: 3.7 g/dL (ref 3.5–5.2)
Alkaline Phosphatase: 72 U/L (ref 39–117)
BUN: 16 mg/dL (ref 6–23)
CO2: 30 meq/L (ref 19–32)
Calcium: 8.7 mg/dL (ref 8.4–10.5)
Chloride: 104 meq/L (ref 96–112)
Creatinine, Ser: 0.94 mg/dL (ref 0.40–1.50)
GFR: 73.9 mL/min (ref >60.00)
Glucose, Bld: 93 mg/dL (ref 70–99)
Potassium: 4 meq/L (ref 3.5–5.1)
Sodium: 141 meq/L (ref 135–145)
Total Bilirubin: 0.5 mg/dL (ref 0.2–1.2)
Total Protein: 6.6 g/dL (ref 6.0–8.3)

## 2024-01-12 LAB — T4, FREE: Free T4: 0.92 ng/dL (ref 0.60–1.60)

## 2024-01-12 LAB — LIPID PANEL
Cholesterol: 115 mg/dL (ref 0–200)
HDL: 53.1 mg/dL (ref >39.00)
LDL Cholesterol: 46 mg/dL (ref 0–99)
NonHDL: 61.69
Total CHOL/HDL Ratio: 2
Triglycerides: 78 mg/dL (ref 0.0–149.0)
VLDL: 15.6 mg/dL (ref 0.0–40.0)

## 2024-01-12 LAB — MICROALBUMIN / CREATININE URINE RATIO
Creatinine,U: 167.3 mg/dL
Microalb Creat Ratio: 236.4 mg/g — ABNORMAL HIGH (ref 0.0–30.0)
Microalb, Ur: 39.6 mg/dL — ABNORMAL HIGH (ref 0.0–1.9)

## 2024-01-12 LAB — T3, FREE: T3, Free: 3.3 pg/mL (ref 2.3–4.2)

## 2024-01-12 LAB — HEMOGLOBIN A1C: Hgb A1c MFr Bld: 6.4 % (ref 4.6–6.5)

## 2024-01-12 LAB — VITAMIN B12: Vitamin B-12: >1537 pg/mL — ABNORMAL HIGH (ref 211–911)

## 2024-01-12 LAB — TSH: TSH: 1.79 u[IU]/mL (ref 0.35–5.50)

## 2024-01-12 MED ORDER — CYANOCOBALAMIN 1000 MCG/ML IJ SOLN
1000.0000 ug | Freq: Once | INTRAMUSCULAR | Status: AC
Start: 2024-01-12 — End: 2024-01-12
  Administered 2024-01-12: 1000 ug via INTRAMUSCULAR

## 2024-01-12 NOTE — Addendum Note (Signed)
 Addended by: Samara Deist on: 01/12/2024 10:56 AM   Modules accepted: Orders

## 2024-01-12 NOTE — Patient Instructions (Addendum)
-   eye is extremely irritated appearing and with prior surgery and retinal detachment recommend seeing Dr. Lorin Picket today with [facility]- call after you leave here or stop by their office- we may have to get you into ophthalmology as well.  -or could call Dr. Luciana Axe as well- if you need assist let me know   B12 injection today- schedule follow up in about a month  Please stop by lab before you go If you have mychart- we will send your results within 3 business days of [place] receiving them.  If you do not have mychart- we will call you about results within 5 business days of [place] receiving them.  *please also note that you will see labs on mychart as soon as they post. I will later go in and write notes on them- will say "notes from Dr. Durene Cal"   Recommended follow up: Return in about 6 months (around 07/11/2024) for followup or sooner if needed.Schedule b4 you leave.

## 2024-01-12 NOTE — Progress Notes (Signed)
 Phone: [phone]   Subjective:  Patient presents today for their annual physical. Chief complaint-noted.   See problem oriented charting- ROS- full  review of systems was completed and negative  except for: left eye redness, knot on right arm where he fell, shaky at times  The following were reviewed and entered/updated in epic: Past Medical History:  Diagnosis Date   ACNE ROSACEA 06/27/2009   ACTINIC KERATOSIS, HEAD 04/18/2009   Acute maxillary sinusitis 05/14/2010   ALLERGIC RHINITIS 04/09/2007   Anal fissure 04/07/2016   B12 DEFICIENCY 06/07/2007   BACK PAIN WITH RADICULOPATHY 04/24/2008   Bilateral nephrolithiasis 07/07/2022   CT abd 06/2022 1. Nonobstructive bilateral nephrolithiasis measuring up to 5 mm on the right and 6 mm on the left.   Cancer [facility])    skin   CHEST WALL PAIN, ACUTE 06/15/2009   Chronic maxillary sinusitis 05/29/2008   COLITIS 04/27/2009   COLONIC POLYPS, HX OF 04/27/2009   tubular adenomas   Coronary artery disease 05/12/2014   Cath 05/12/2014 w/ severe 3-vessel CAD and preserved LV function, EF 55%   DERMATITIS, ATOPIC 10/12/2007   Diverticular disease 07/07/2022   CT abd 06/2022 . Colonic diverticulosis with no acute diverticulitis.   DIVERTICULOSIS, COLON 04/27/2009   ECCHYMOSES, SPONTANEOUS 06/27/2008   Elevated sedimentation rate 05/02/2009   ESOPHAGEAL STRICTURE 04/27/2009   GASTRITIS, CHRONIC 04/27/2009   HYPERLIPIDEMIA 03/13/2008   HYPERTENSION 04/09/2007   Hyperthyroidism    Iliac aneurysm (HCC) 07/07/2022   CT abd 06/2022 . Aneurysmal left common iliac artery (1.6 cm).     Internal bleeding hemorrhoids 01/22/2015   01/22/2015 Seen at anoscopy, grade 1 all 3 positions    Irritable bowel syndrome 08/11/2007   KIDNEY DISEASE 04/09/2007   NECK PAIN 09/14/2008   NEUROPATHY, IDIOPATHIC PERIPHERAL NEC 08/11/2007   OSTEOARTHRITIS, WRIST, RIGHT 08/05/2010   Postoperative delirium 05/20/2014   S/P CABG x 4 05/19/2014   LIMA to LAD, SVG to  diag, SVG to OM, SVG to PDA, EVH via right thigh and leg   Patient Active Problem List   Diagnosis Date Noted   Mixed vascular and neurodegenerative dementia without behavioral disturbance (HCC) 11/28/2021    Priority: High   Hyperthyroidism 01/13/2018    Priority: High   Detached retina, left 06/30/2017    Priority: High   AAA (abdominal aortic aneurysm) (HCC) 05/12/2017    Priority: High   CAD (coronary artery disease) of artery bypass graft 05/19/2014    Priority: High   Aortic atherosclerosis (HCC) 01/05/2023    Priority: Medium    Hyperglycemia 07/22/2022    Priority: Medium    Iliac aneurysm (HCC) 07/07/2022    Priority: Medium    Abdominal aortic aneurysm (AAA) without rupture, unspecified part (HCC) 11/27/2021    Priority: Medium    History of atrial fibrillation 08/26/2019    Priority: Medium    BPPV (benign paroxysmal positional vertigo) 09/01/2016    Priority: Medium    Insomnia 11/22/2014    Priority: Medium    Anemia 07/20/2014    Priority: Medium    BPH (benign prostatic hyperplasia) 12/05/2013    Priority: Medium    Hyperlipidemia 03/13/2008    Priority: Medium    B12 deficiency 06/07/2007    Priority: Medium    Essential hypertension 04/09/2007    Priority: Medium    Diverticular disease 07/07/2022    Priority: Low   Chronic obstructive pulmonary disease (HCC) 08/26/2019    Priority: Low   Left sided abdominal pain 04/15/2017  Priority: Low   Low back pain 02/06/2017    Priority: Low   Anal fissure 04/07/2016    Priority: Low   Internal and external bleeding hemorrhoids 01/22/2015    Priority: Low   Palpitations 02/11/2013    Priority: Low   OSTEOARTHRITIS, WRIST, RIGHT 08/05/2010    Priority: Low   ACNE ROSACEA 06/27/2009    Priority: Low   ESOPHAGEAL STRICTURE 04/27/2009    Priority: Low   ACTINIC KERATOSIS, HEAD 04/18/2009    Priority: Low   NECK PAIN 09/14/2008    Priority: Low   NEUROPATHY, IDIOPATHIC PERIPHERAL NEC 08/11/2007     Priority: Low   Irritable bowel syndrome 08/11/2007    Priority: Low   ALLERGIC RHINITIS 04/09/2007    Priority: Low   Graves disease 03/05/2023   Bilateral nephrolithiasis 07/07/2022   Enlarged prostate 07/07/2022   Degeneration of lumbar intervertebral disc 10/21/2018   Past Surgical History:  Procedure Laterality Date   CARDIAC CATHETERIZATION     COLONOSCOPY     CORONARY ARTERY BYPASS GRAFT N/A 05/19/2014   Procedure: CORONARY ARTERY BYPASS GRAFTING (CABG);  Surgeon: Purcell Nails, MD;  Location: [facility] OR;  Service: Open Heart Surgery;  Laterality: N/A;  Times 4 using left internal mammary artery and endoscopically harvested right saphenous vein   ESOPHAGOGASTRODUODENOSCOPY     FINGER SURGERY     cut off end of finger   FLEXIBLE SIGMOIDOSCOPY     HEMORRHOID BANDING     HERNIA REPAIR     INCISION AND DRAINAGE WOUND WITH FOREIGN BODY REMOVAL Left 12/20/2013   Procedure: INCISION AND DRAINAGE LEFT INDEX FINGER;  Surgeon: Tami Ribas, MD;  Location: [facility] ORS;  Service: Orthopedics;  Laterality: Left;   INTRAOPERATIVE TRANSESOPHAGEAL ECHOCARDIOGRAM N/A 05/19/2014   Procedure: INTRAOPERATIVE TRANSESOPHAGEAL ECHOCARDIOGRAM;  Surgeon: Purcell Nails, MD;  Location: [facility] OR;  Service: Open Heart Surgery;  Laterality: N/A;   lamenectomy     LEFT HEART CATHETERIZATION WITH CORONARY ANGIOGRAM N/A 05/12/2014   Procedure: LEFT HEART CATHETERIZATION WITH CORONARY ANGIOGRAM;  Surgeon: Lesleigh Noe, MD;  Location: [facility] CATH LAB;  Service: Cardiovascular;  Laterality: N/A;   LUMBAR FUSION     TONSILLECTOMY     VARICOSE VEIN SURGERY Left     Family History  Problem Relation Age of Onset   Hernia Mother    Heart disease Father        smoker   COPD Father    Heart attack Father    Heart attack Brother    Early death Daughter    Colon cancer Neg Hx    Esophageal cancer Neg Hx    Pancreatic cancer Neg Hx     Medications- reviewed and updated Current Outpatient Medications  Medication Sig  Dispense Refill   aspirin 81 MG EC tablet Take 1 tablet (81 mg total) by mouth daily.     donepezil (ARICEPT) 10 MG tablet Take 1 tablet (10 mg total) by mouth daily. 30 tablet 11   FLUoxetine (PROZAC) 20 MG capsule TAKE 1 CAPSULE(20 MG) BY MOUTH DAILY 90 capsule 3   memantine (NAMENDA) 10 MG tablet Take 1 tablet  twice a day 60 tablet 11   methimazole (TAPAZOLE) 5 MG tablet Take 0.5 tablets (2.5 mg total) by mouth daily. 45 tablet 3   Multiple Vitamins-Minerals (CENTRUM SILVER PO) Take 1 tablet by mouth daily.     rosuvastatin (CRESTOR) 40 MG tablet Take 1 tablet (40 mg total) by mouth daily. Dose increase 01/13/22 due  to heart disease and LDL goal under 70 90 tablet 3   tamsulosin (FLOMAX) 0.4 MG CAPS capsule Take 0.4 mg by mouth. Prescribed by urology     No current facility-administered medications for this visit.    Allergies-reviewed and updated Allergies  Allergen Reactions   Lipitor [Atorvastatin] Other (See Comments)    REACTION: nausea and blurred vision   Trazodone And Nefazodone Other (See Comments)    dizzy   Ciprofloxacin Swelling   Mycophenolate Mofetil Other (See Comments)    REACTION: unspecified   Amoxicillin Rash    Has patient had a PCN reaction causing immediate rash, facial/tongue/throat swelling, SOB or lightheadedness with hypotension: Unknown Has patient had a PCN reaction causing severe rash involving mucus membranes or skin necrosis: Unknown Has patient had a PCN reaction that required hospitalization: Unknown Has patient had a PCN reaction occurring within the last 10 years: Unknown If all of the above answers are "NO", then may proceed with Cephalosporin use.    Penicillins Rash    Has patient had a PCN reaction causing immediate rash, facial/tongue/throat swelling, SOB or lightheadedness with hypotension: Unknown Has patient had a PCN reaction causing severe rash involving mucus membranes or skin necrosis: Unknown Has patient had a PCN reaction that  required hospitalization: Unknown Has patient had a PCN reaction occurring within the last 10 years: Unknown If all of the above answers are "NO", then may proceed with Cephalosporin use.    Rosuvastatin Other (See Comments)    Unknown     Social History   Social History Narrative   Married 35 years (2nd marriage). 2 kids from previous marriage (lost a 3rd child hit by car at age 40). 2 stepchildren. 6 grandkids. 2 greatgrandkids.       Retired from [employer]      Hobbies: walking/exercise, building in shop-furniture (cut finger off in February doing this)   6 grand children - Has bought them all a new car    Brother died of probably addiction   Sister also died; not sure of cause    He enjoys his life; Likes to play the keyboard; guitar    Had played in a gospel quartet    Enjoys working with wood    Right handed   Drinks caffeine   Objective  Objective:  BP 128/80   Pulse 82   Temp 97.9 F (36.6 C)   Ht 6\' 2"  (1.88 m)   Wt 185 lb (83.9 kg)   SpO2 97%   BMI 23.75 kg/m  Gen: NAD, resting comfortably HEENT: Mucous membranes are moist. Oropharynx normal, turbinates mildly erythematous Neck: no thyromegaly CV: RRR no murmurs rubs or gallops Lungs: CTAB no crackles, wheeze, rhonchi Abdomen: soft/nontender/nondistended/normal bowel sounds. No rebound or guarding.  Ext: no edema Skin: warm, dry Neuro: grossly normal, moves all extremities, left eye pupil distorted from prior surgery- sclera erythematous and conjunctiva is as well without discharge   Assessment and Plan  88 y.o. male presenting for annual physical.  Health Maintenance counseling: 1. Anticipatory guidance: Patient counseled regarding regular dental exams -q6 months, eye exams - yearly at least- see notes below,  avoiding smoking and second hand smoke , limiting alcohol to 2 beverages per day - none, no illicit drugs .   2. Risk factor reduction:  Advised patient of need for regular exercise  and diet rich and fruits and vegetables to reduce risk of heart attack and stroke.  Exercise- hard to exercise with balance issues at  times.  Diet/weight management-weight stbale from last visit.  Wt Readings from Last 3 Encounters:  01/12/24 185 lb (83.9 kg)  11/23/23 185 lb 12.8 oz (84.3 kg)  10/20/23 199 lb (90.3 kg)  3. Immunizations/screenings/ancillary studies- up to date other than holding off on covid Immunization History  Administered Date(s) Administered   Fluad Quad(high Dose 65+) 07/28/2019, 08/22/2020   Fluad Trivalent(High Dose 65+) 08/27/2023   Influenza Split 08/11/2011, 08/05/2012   Influenza Whole 08/31/2007, 08/15/2008, 08/20/2009, 08/05/2010   Influenza, High Dose Seasonal PF 07/28/2016, 09/10/2017, 08/13/2018, 08/23/2021   Influenza,inj,Quad PF,6+ Mos 07/15/2013, 07/20/2014, 08/10/2015   Influenza,inj,quad, With Preservative 08/17/2018   Influenza-Unspecified 08/26/2022   PFIZER(Purple Top)SARS-COV-2 Vaccination 12/02/2019, 12/22/2019, 08/28/2020, 08/26/2022   Pfizer Covid-19 Vaccine Bivalent Booster 73yrs & up 10/16/2021   Pneumococcal Conjugate-13 11/04/2013   Pneumococcal Polysaccharide-23 02/05/2015   Td 03/18/2007   Tdap 06/04/2018   Zoster Recombinant(Shingrix) 08/15/2019, 10/31/2019   Zoster, Live 04/16/2011  4. Prostate cancer screening- past age based screening recommendations    5. Colon cancer screening - past age based screening recommendations  6. Skin cancer screening- no recent visits. advised regular sunscreen use. Denies worrisome, changing, or new skin lesions.  7. Smoking associated screening (lung cancer screening, AAA screen 65-75, UA)- former smoker- no regular screening 8. STD screening - only active with wife  Status of chronic or acute concerns   #knot on the right arm- occurred after fall with a lot of bruising- seems to be getting better and less tender. Could be a hematoma 2 x 2 cm at least today- vs lipoma- monitor on follow up   -recommended cane or walker  #Left eye redness- eye of retinal detachment- went to eye doctor 5 weeks ago and was given drops for 7 days as well- redness went away completely but then returned. Some pain yesterday. Some stinging today. No change in vision - eye is extremely irritated appearing and with prior surgery and retinal detachment recommend seeing Dr. Lorin Picket today with [facility]- call after you leave here or stop by their office- we may have to get you into ophthalmology as well.  -or could call Dr. Luciana Axe as well- if you need assist let me know   #ongoing dizziness with standing- could stop Flomax but owrry about prior urinary tract infection which led to hospitalization  #B12 deficiency- check today and give 1000 mcg today as well   #Dementia likely due to mixed vascular and Alzheimer's dementia-follows with Dr. Brent Bulla, PA #irritability- trial zoloft 25 mg starting 03/17/22 (had nausea)- later changed to fluoxetine 10 mg (no issues with nausea) and later increased to 20 mg S: Medication: Donepezil 10 mg, memantine 10 mg  twice daily -Most recent MRI 12/19/2021 prior cerebellar stroke -avoid rexulti as a result  Fluoxetine helpful for irritability but still impefect A/P: dementia ongoing- continue to monitor and keep neurology follow up. For agitation- the fluoxetine is helpful     #CAD- with history of CABG in 2015 - prior Dr. Verdis Prime #hyperlipidemia - LDL goal under 70 S: Medication: rosuvastatin 40 mg, aspirin 81 mg   - no chest pain or shortness of breath  Lab Results  Component Value Date   CHOL 104 07/02/2023   HDL 44.80 07/02/2023   LDLCALC 35 07/02/2023   LDLDIRECT 75.0 11/27/2021   TRIG 121.0 07/02/2023   CHOLHDL 2 07/02/2023   A/P: coronary artery disease asymptomatic continue current medications  Lipids at goal- update today  %hyperthyroidism- follows with Dr. Elvera Lennox  S: compliant On thyroid medication- methimazole 2.5 mg  BID A/P:hopefully stable- update TSH, t3, t4 today. Continue current meds for now    #hypertension/orthostatic hypotension S: medication:  none -orthostatic intolerance on ARB and beta blocker in past- and BP has trended down. Still with some orthostatic issues on tamsulosin A/P: controlled without medications- continue to monitor   # AAA S:07/03/22 ct- 3. Stable suprarenal abdominal aorta aneurysm (3.1 cm) and interval increase in size of an infrarenal abdominal aorta aneurysm (3.7 cm.) Recommend follow-up ultrasound every 2 years. This recommendation follows ACR consensus guidelines: White Paper of the ACR Incidental Findings Committee II on Vascular Findings. J Am Coll Radiol 2013; 10:789-794.  A/P: due next visit- glad blood pressure controlled   # BPH S:history of severe UTI leading to hospitalization. Orthostatic issues but feel we have to leave him on flomax  A/P: reasonable control- continue current medications    # B12 deficiency S: Current treatment/medication (oral vs. IM):  injections montly A/P: reasonable to give monthly injection today then schedule follow up in a month   #history of left retina detachment- permanent loss of vision - see concern above  #copd - incidental finding- denies symptoms  # Hyperglycemia/insulin resistance/prediabetes- peak a1c of 6.5- check again today- if 6.5 or above will need to diagnose as diabetes  Lab Results  Component Value Date   HGBA1C 6.5 07/22/2022   HGBA1C 6.4 11/27/2021   HGBA1C 6.4 05/16/2021   Recommended follow up: Return in about 6 months (around 07/11/2024) for followup or sooner if needed.Schedule b4 you leave. Future Appointments  Date Time Provider Department Center  03/04/2024 10:00 AM [facility] None  03/09/2024  3:00 PM Marcos Eke, PA-C [facility] None  09/06/2024 10:00 AM Carlus Pavlov, MD [facility] None  10/31/2024  1:00 PM [facility] ANNUAL WELLNESS VISIT 1 [facility]EC    Lab/Order associations:NOT fasting- coffee and pills   ICD-10-CM   1. Preventative health care  Z00.00     2. Proteinuria, unspecified type  R80.9 Urinalysis, Routine w reflex microscopic    Microalbumin / creatinine urine ratio    3. Hyperthyroidism  E05.90 TSH    T4, free    T3, free    4. B12 deficiency  E53.8 Vitamin B12    5. Screening for diabetes mellitus  Z13.1 Hemoglobin A1c    6. Hyperglycemia  R73.9 Hemoglobin A1c    7. Hyperlipidemia, unspecified hyperlipidemia type  E78.5 Comprehensive metabolic panel    CBC with Differential/Platelet    Lipid panel    8. Essential hypertension  I10 Comprehensive metabolic panel    CBC with Differential/Platelet    Lipid panel      No orders of the defined types were placed in this encounter.   Return precautions advised.  Tana Conch, MD

## 2024-01-15 ENCOUNTER — Other Ambulatory Visit: Payer: Self-pay

## 2024-01-15 MED ORDER — VALSARTAN 40 MG PO TABS: 40.0000 mg | ORAL_TABLET | Freq: Every day | ORAL | 3 refills | Status: DC

## 2024-01-18 ENCOUNTER — Telehealth: Payer: Self-pay

## 2024-01-18 NOTE — Telephone Encounter (Signed)
 Returned call to pt wife. The below message was taken incorrectly, the medication in question was not b12 it was for the Valsartan which was sent in on 01/15/24 and pharmacy confirmed receipt of it at 8:51 am. I have made pt wife aware and she will check back with the pharmacy.  Copied from CRM [id number]. Topic: Clinical - Prescription Issue >> Jan 18, 2024  9:01 AM Dimitri Ped wrote: Reason for CRM: patient wife is calling about a appointment last week the patient was seen and the doctor said they would send in a mediucation but the pharmacy said no one has called anything in . The patient was seen on the 25th and I do see the medication on appointment  notes  Cyancobalamin is the medicqation I see in Tuesday after visit notes  [phone]

## 2024-01-25 ENCOUNTER — Emergency Department (HOSPITAL_COMMUNITY)

## 2024-01-25 ENCOUNTER — Ambulatory Visit: Payer: Self-pay | Admitting: Family Medicine

## 2024-01-25 ENCOUNTER — Ambulatory Visit
Admission: EM | Admit: 2024-01-25 | Discharge: 2024-01-25 | Disposition: A | Discharge status: Home / Self Care | Attending: Family Medicine | Admitting: Family Medicine

## 2024-01-25 ENCOUNTER — Observation Stay (HOSPITAL_COMMUNITY)
Admission: EM | Admit: 2024-01-25 | Discharge: 2024-01-27 | Disposition: A | Discharge status: Home / Self Care | Attending: Internal Medicine | Admitting: Internal Medicine

## 2024-01-25 DIAGNOSIS — E785 Hyperlipidemia, unspecified: Secondary | ICD-10-CM | POA: Insufficient documentation

## 2024-01-25 DIAGNOSIS — F0390 Unspecified dementia without behavioral disturbance: Secondary | ICD-10-CM | POA: Insufficient documentation

## 2024-01-25 DIAGNOSIS — N400 Enlarged prostate without lower urinary tract symptoms: Secondary | ICD-10-CM | POA: Diagnosis not present

## 2024-01-25 DIAGNOSIS — N1330 Unspecified hydronephrosis: Secondary | ICD-10-CM | POA: Insufficient documentation

## 2024-01-25 DIAGNOSIS — N23 Unspecified renal colic: Secondary | ICD-10-CM | POA: Diagnosis not present

## 2024-01-25 DIAGNOSIS — N132 Hydronephrosis with renal and ureteral calculous obstruction: Secondary | ICD-10-CM | POA: Diagnosis not present

## 2024-01-25 DIAGNOSIS — R109 Unspecified abdominal pain: Secondary | ICD-10-CM | POA: Diagnosis not present

## 2024-01-25 DIAGNOSIS — I714 Abdominal aortic aneurysm, without rupture, unspecified: Secondary | ICD-10-CM | POA: Insufficient documentation

## 2024-01-25 DIAGNOSIS — I10 Essential (primary) hypertension: Secondary | ICD-10-CM | POA: Insufficient documentation

## 2024-01-25 DIAGNOSIS — Z951 Presence of aortocoronary bypass graft: Secondary | ICD-10-CM | POA: Diagnosis not present

## 2024-01-25 DIAGNOSIS — N201 Calculus of ureter: Secondary | ICD-10-CM | POA: Diagnosis not present

## 2024-01-25 DIAGNOSIS — E039 Hypothyroidism, unspecified: Secondary | ICD-10-CM | POA: Insufficient documentation

## 2024-01-25 DIAGNOSIS — Z79899 Other long term (current) drug therapy: Secondary | ICD-10-CM | POA: Diagnosis not present

## 2024-01-25 DIAGNOSIS — N179 Acute kidney failure, unspecified: Principal | ICD-10-CM | POA: Insufficient documentation

## 2024-01-25 DIAGNOSIS — Z7982 Long term (current) use of aspirin: Secondary | ICD-10-CM | POA: Diagnosis not present

## 2024-01-25 DIAGNOSIS — Z87891 Personal history of nicotine dependence: Secondary | ICD-10-CM | POA: Diagnosis not present

## 2024-01-25 DIAGNOSIS — N368 Other specified disorders of urethra: Secondary | ICD-10-CM | POA: Diagnosis not present

## 2024-01-25 DIAGNOSIS — K5730 Diverticulosis of large intestine without perforation or abscess without bleeding: Secondary | ICD-10-CM | POA: Diagnosis not present

## 2024-01-25 DIAGNOSIS — I7143 Infrarenal abdominal aortic aneurysm, without rupture: Secondary | ICD-10-CM | POA: Diagnosis not present

## 2024-01-25 DIAGNOSIS — R1031 Right lower quadrant pain: Secondary | ICD-10-CM | POA: Diagnosis not present

## 2024-01-25 DIAGNOSIS — Q6211 Congenital occlusion of ureteropelvic junction: Secondary | ICD-10-CM | POA: Diagnosis not present

## 2024-01-25 DIAGNOSIS — Z85828 Personal history of other malignant neoplasm of skin: Secondary | ICD-10-CM | POA: Insufficient documentation

## 2024-01-25 LAB — COMPREHENSIVE METABOLIC PANEL
ALT: 22 U/L (ref 0–44)
AST: 37 U/L (ref 15–41)
Albumin: 3.5 g/dL (ref 3.5–5.0)
Alkaline Phosphatase: 65 U/L (ref 38–126)
Anion gap: 10 (ref 5–15)
BUN: 25 mg/dL — ABNORMAL HIGH (ref 8–23)
CO2: 24 mmol/L (ref 22–32)
Calcium: 8.9 mg/dL (ref 8.9–10.3)
Chloride: 106 mmol/L (ref 98–111)
Creatinine, Ser: 1.85 mg/dL — ABNORMAL HIGH (ref 0.61–1.24)
GFR, Estimated: 35 mL/min — ABNORMAL LOW (ref >60)
Glucose, Bld: 107 mg/dL — ABNORMAL HIGH (ref 70–99)
Potassium: 4.1 mmol/L (ref 3.5–5.1)
Sodium: 140 mmol/L (ref 135–145)
Total Bilirubin: 0.9 mg/dL (ref 0.0–1.2)
Total Protein: 7.3 g/dL (ref 6.5–8.1)

## 2024-01-25 LAB — URINALYSIS, ROUTINE W REFLEX MICROSCOPIC
Bacteria, UA: NONE SEEN
Bilirubin Urine: NEGATIVE
Glucose, UA: NEGATIVE mg/dL
Ketones, ur: NEGATIVE mg/dL
Leukocytes,Ua: NEGATIVE
Nitrite: NEGATIVE
Protein, ur: 100 mg/dL — AB
Specific Gravity, Urine: 1.019 (ref 1.005–1.030)
pH: 5 (ref 5.0–8.0)

## 2024-01-25 LAB — CBC
HCT: 41.2 % (ref 39.0–52.0)
Hemoglobin: 12.6 g/dL — ABNORMAL LOW (ref 13.0–17.0)
MCH: 27.3 pg (ref 26.0–34.0)
MCHC: 30.6 g/dL (ref 30.0–36.0)
MCV: 89.4 fL (ref 80.0–100.0)
Platelets: 170 10*3/uL (ref 150–400)
RBC: 4.61 MIL/uL (ref 4.22–5.81)
RDW: 15.7 % — ABNORMAL HIGH (ref 11.5–15.5)
WBC: 7.5 10*3/uL (ref 4.0–10.5)
nRBC: 0 % (ref 0.0–0.2)

## 2024-01-25 LAB — LIPASE, BLOOD: Lipase: 32 U/L (ref 11–51)

## 2024-01-25 MED ORDER — SODIUM CHLORIDE 0.9 % IV SOLN: INTRAVENOUS | Status: DC

## 2024-01-25 MED ORDER — POLYETHYLENE GLYCOL 3350 17 G PO PACK: 17.0000 g | PACK | Freq: Every day | ORAL | Status: DC | PRN

## 2024-01-25 MED ORDER — ENOXAPARIN SODIUM 40 MG/0.4ML IJ SOSY
40.0000 mg | PREFILLED_SYRINGE | Freq: Every day | INTRAMUSCULAR | Status: DC
Start: 2024-01-26 — End: 2024-01-27
  Administered 2024-01-26: 40 mg via SUBCUTANEOUS
  Filled 2024-01-25: qty 0.4

## 2024-01-25 MED ORDER — QUETIAPINE FUMARATE 50 MG PO TABS
25.0000 mg | ORAL_TABLET | Freq: Every day | ORAL | Status: AC
  Administered 2024-01-25: 25 mg via ORAL
  Filled 2024-01-25: qty 1

## 2024-01-25 MED ORDER — SODIUM CHLORIDE 0.9% FLUSH
3.0000 mL | Freq: Two times a day (BID) | INTRAVENOUS | Status: DC
  Administered 2024-01-25 – 2024-01-27 (×4): 3 mL via INTRAVENOUS

## 2024-01-25 MED ORDER — SODIUM CHLORIDE 0.9 % IV BOLUS
1000.0000 mL | Freq: Once | INTRAVENOUS | Status: AC
  Administered 2024-01-25: 1000 mL via INTRAVENOUS

## 2024-01-25 MED ORDER — ACETAMINOPHEN 325 MG PO TABS
650.0000 mg | ORAL_TABLET | Freq: Four times a day (QID) | ORAL | Status: DC | PRN
  Administered 2024-01-26: 650 mg via ORAL
  Filled 2024-01-25: qty 2

## 2024-01-25 MED ORDER — ACETAMINOPHEN 650 MG RE SUPP: 650.0000 mg | Freq: Four times a day (QID) | RECTAL | Status: DC | PRN

## 2024-01-25 MED ORDER — TAMSULOSIN HCL 0.4 MG PO CAPS
0.4000 mg | ORAL_CAPSULE | Freq: Two times a day (BID) | ORAL | Status: DC
  Administered 2024-01-25 – 2024-01-27 (×4): 0.4 mg via ORAL
  Filled 2024-01-25 (×4): qty 1

## 2024-01-25 NOTE — ED Provider Notes (Signed)
 [facility]  Note:  This document was prepared using Conservation officer, historic buildings and may include unintentional dictation errors.  MRN: [id number] DOB: 14-Jun-1936  Subjective:   Timothy Gallagher is a 88 y.o. male presenting for 4-day history of progressively worsening right-sided abdominal pain worse in the right lower quadrant.  Patient reports that symptoms are intermittent but stabbing and sharp.  They can be severe as well.  Has a history of diverticulosis.  Still has his appendix.  He also has a history of an abdominal aortic aneurysm.  Denies bloody stools, fever.  Last bowel movement was this morning was complete.  No current facility-administered medications for this encounter.  Current Outpatient Medications:    aspirin 81 MG EC tablet, Take 1 tablet (81 mg total) by mouth daily., Disp: , Rfl:    donepezil (ARICEPT) 10 MG tablet, Take 1 tablet (10 mg total) by mouth daily., Disp: 30 tablet, Rfl: 11   FLUoxetine (PROZAC) 20 MG capsule, TAKE 1 CAPSULE(20 MG) BY MOUTH DAILY, Disp: 90 capsule, Rfl: 3   memantine (NAMENDA) 10 MG tablet, Take 1 tablet  twice a day, Disp: 60 tablet, Rfl: 11   methimazole (TAPAZOLE) 5 MG tablet, Take 0.5 tablets (2.5 mg total) by mouth daily., Disp: 45 tablet, Rfl: 3   Multiple Vitamins-Minerals (CENTRUM SILVER PO), Take 1 tablet by mouth daily., Disp: , Rfl:    rosuvastatin (CRESTOR) 40 MG tablet, Take 1 tablet (40 mg total) by mouth daily. Dose increase 01/13/22 due to heart disease and LDL goal under 70, Disp: 90 tablet, Rfl: 3   tamsulosin (FLOMAX) 0.4 MG CAPS capsule, Take 0.4 mg by mouth. Prescribed by urology, Disp: , Rfl:    valsartan (DIOVAN) 40 MG tablet, Take 1 tablet (40 mg total) by mouth daily., Disp: 90 tablet, Rfl: 3   Allergies  Allergen Reactions   Lipitor [Atorvastatin] Other (See Comments)    REACTION: nausea and blurred vision   Trazodone And Nefazodone Other (See Comments)    dizzy   Ciprofloxacin  Swelling   Mycophenolate Mofetil Other (See Comments)    REACTION: unspecified   Amoxicillin Rash    Has patient had a PCN reaction causing immediate rash, facial/tongue/throat swelling, SOB or lightheadedness with hypotension: Unknown Has patient had a PCN reaction causing severe rash involving mucus membranes or skin necrosis: Unknown Has patient had a PCN reaction that required hospitalization: Unknown Has patient had a PCN reaction occurring within the last 10 years: Unknown If all of the above answers are "NO", then may proceed with Cephalosporin use.    Penicillins Rash    Has patient had a PCN reaction causing immediate rash, facial/tongue/throat swelling, SOB or lightheadedness with hypotension: Unknown Has patient had a PCN reaction causing severe rash involving mucus membranes or skin necrosis: Unknown Has patient had a PCN reaction that required hospitalization: Unknown Has patient had a PCN reaction occurring within the last 10 years: Unknown If all of the above answers are "NO", then may proceed with Cephalosporin use.    Rosuvastatin Other (See Comments)    Unknown     Past Medical History:  Diagnosis Date   ACNE ROSACEA 06/27/2009   ACTINIC KERATOSIS, HEAD 04/18/2009   Acute maxillary sinusitis 05/14/2010   ALLERGIC RHINITIS 04/09/2007   Anal fissure 04/07/2016   B12 DEFICIENCY 06/07/2007   BACK PAIN WITH RADICULOPATHY 04/24/2008   Bilateral nephrolithiasis 07/07/2022   CT abd 06/2022 1. Nonobstructive bilateral nephrolithiasis measuring up to 5 mm on  the right and 6 mm on the left.   Cancer [facility])    skin   CHEST WALL PAIN, ACUTE 06/15/2009   Chronic maxillary sinusitis 05/29/2008   COLITIS 04/27/2009   COLONIC POLYPS, HX OF 04/27/2009   tubular adenomas   Coronary artery disease 05/12/2014   Cath 05/12/2014 w/ severe 3-vessel CAD and preserved LV function, EF 55%   DERMATITIS, ATOPIC 10/12/2007   Diverticular disease 07/07/2022   CT abd 06/2022 . Colonic  diverticulosis with no acute diverticulitis.   DIVERTICULOSIS, COLON 04/27/2009   ECCHYMOSES, SPONTANEOUS 06/27/2008   Elevated sedimentation rate 05/02/2009   ESOPHAGEAL STRICTURE 04/27/2009   GASTRITIS, CHRONIC 04/27/2009   HYPERLIPIDEMIA 03/13/2008   HYPERTENSION 04/09/2007   Hyperthyroidism    Iliac aneurysm (HCC) 07/07/2022   CT abd 06/2022 . Aneurysmal left common iliac artery (1.6 cm).     Internal bleeding hemorrhoids 01/22/2015   01/22/2015 Seen at anoscopy, grade 1 all 3 positions    Irritable bowel syndrome 08/11/2007   KIDNEY DISEASE 04/09/2007   NECK PAIN 09/14/2008   NEUROPATHY, IDIOPATHIC PERIPHERAL NEC 08/11/2007   OSTEOARTHRITIS, WRIST, RIGHT 08/05/2010   Postoperative delirium 05/20/2014   S/P CABG x 4 05/19/2014   LIMA to LAD, SVG to diag, SVG to OM, SVG to PDA, EVH via right thigh and leg     Past Surgical History:  Procedure Laterality Date   CARDIAC CATHETERIZATION     COLONOSCOPY     CORONARY ARTERY BYPASS GRAFT N/A 05/19/2014   Procedure: CORONARY ARTERY BYPASS GRAFTING (CABG);  Surgeon: Purcell Nails, MD;  Location: [facility] OR;  Service: Open Heart Surgery;  Laterality: N/A;  Times 4 using left internal mammary artery and endoscopically harvested right saphenous vein   ESOPHAGOGASTRODUODENOSCOPY     FINGER SURGERY     cut off end of finger   FLEXIBLE SIGMOIDOSCOPY     HEMORRHOID BANDING     HERNIA REPAIR     INCISION AND DRAINAGE WOUND WITH FOREIGN BODY REMOVAL Left 12/20/2013   Procedure: INCISION AND DRAINAGE LEFT INDEX FINGER;  Surgeon: Tami Ribas, MD;  Location: [facility] ORS;  Service: Orthopedics;  Laterality: Left;   INTRAOPERATIVE TRANSESOPHAGEAL ECHOCARDIOGRAM N/A 05/19/2014   Procedure: INTRAOPERATIVE TRANSESOPHAGEAL ECHOCARDIOGRAM;  Surgeon: Purcell Nails, MD;  Location: [facility] OR;  Service: Open Heart Surgery;  Laterality: N/A;   lamenectomy     LEFT HEART CATHETERIZATION WITH CORONARY ANGIOGRAM N/A 05/12/2014   Procedure: LEFT HEART CATHETERIZATION WITH  CORONARY ANGIOGRAM;  Surgeon: Lesleigh Noe, MD;  Location: [facility] CATH LAB;  Service: Cardiovascular;  Laterality: N/A;   LUMBAR FUSION     TONSILLECTOMY     VARICOSE VEIN SURGERY Left     Family History  Problem Relation Age of Onset   Hernia Mother    Heart disease Father        smoker   COPD Father    Heart attack Father    Heart attack Brother    Early death Daughter    Colon cancer Neg Hx    Esophageal cancer Neg Hx    Pancreatic cancer Neg Hx     Social History   Tobacco Use   Smoking status: Former    Current packs/day: 0.00    Average packs/day: 0.5 packs/day for 42.0 years (21.0 ttl pk-yrs)    Types: Cigarettes    Start date: 11/17/1956    Quit date: 11/17/1998    Years since quitting: 25.2   Smokeless tobacco: Never  Vaping Use  Vaping status: Never Used  Substance Use Topics   Alcohol use: No   Drug use: No    ROS   Objective:   Vitals: BP (!) 168/87 (BP Location: Right Arm)   Pulse 74   Temp 98 F (36.7 C) (Oral)   Resp 20   SpO2 97%   Physical Exam Constitutional:      General: He is not in acute distress.    Appearance: Normal appearance. He is well-developed and normal weight. He is not ill-appearing, toxic-appearing or diaphoretic.  HENT:     Head: Normocephalic and atraumatic.     Right Ear: External ear normal.     Left Ear: External ear normal.     Nose: Nose normal.     Mouth/Throat:     Mouth: Mucous membranes are moist.     Pharynx: Oropharynx is clear.  Eyes:     General: No scleral icterus.       Right eye: No discharge.        Left eye: No discharge.     Extraocular Movements: Extraocular movements intact.  Cardiovascular:     Rate and Rhythm: Normal rate and regular rhythm.     Heart sounds: Normal heart sounds. No murmur heard.    No friction rub. No gallop.  Pulmonary:     Effort: Pulmonary effort is normal. No respiratory distress.     Breath sounds: Normal breath sounds. No stridor. No wheezing, rhonchi or rales.   Abdominal:     General: Bowel sounds are normal. There is no distension.     Palpations: Abdomen is soft. There is no mass.     Tenderness: There is abdominal tenderness in the right upper quadrant and right lower quadrant. There is guarding. There is no right CVA tenderness, left CVA tenderness or rebound.  Musculoskeletal:     Cervical back: Normal range of motion.  Neurological:     Mental Status: He is alert and oriented to person, place, and time.  Psychiatric:        Mood and Affect: Mood normal.        Behavior: Behavior normal.        Thought Content: Thought content normal.        Judgment: Judgment normal.     Assessment and Plan :   PDMP not reviewed this encounter.  1. Right sided abdominal pain    Patient is in need of a higher level of care than we can provide in the urgent care setting.  Advised that he present to the emergency room for workup and rule out of an acute abdomen, acute diverticulitis, acute appendicitis.  Emphasized that he will remain n.p.o. until seen by an APP or physician.  Discussed this with the patient and his wife, contract for safety and will present to the emergency room now.  Patient is hemodynamically stable and can present by personal vehicle.   Wallis Bamberg, [place] 01/25/24 [id number]

## 2024-01-25 NOTE — Assessment & Plan Note (Addendum)
 Previously known.  Currently at 4 cm.  Follow-up ultrasound routinly

## 2024-01-25 NOTE — ED Notes (Signed)
 Family leaving patient bedside at this time to go home

## 2024-01-25 NOTE — ED Triage Notes (Signed)
 Pt referred to ED from UC to r/o appendicitis. Pt c/o RLQ abd pain for 4-5 days with nausea. Hx largely provided by wife. Pt oriented to person and place only, which wife states is baseline.

## 2024-01-25 NOTE — Assessment & Plan Note (Addendum)
 Patient presented with right-sided ureteric colic noted to have acute kidney injury as well as right sided have hydronephrosis due to subcentimeter renal stone on CAT scan.  Patient currently has had resolution of symptoms.  At this time I will start the patient on tamsulosin, patient is s/p 1 L normal saline in the ER, continue with 125 cc/h tonight.  I will repeat an ultrasound in the morning to see if hydronephrosis has resolved. Urology engaged by ER provider note pending. No concern for infection at this time. Microscopic hematuria. Tamsulosin ordered

## 2024-01-25 NOTE — Consult Note (Signed)
 Urology Consult    Reason for consult: right proximal ureteral stone, elevated Cr  History of Present Illness: Timothy Gallagher is a 88 y.o. M who presented to the ED yesterday evening c/o RLQ pain.   CT scan reviewed - shows 7 mm proximal right ureteral stone with mild hydronephrosis. 7 mm stone in R lower pole. 9 mm stone in L lower pole. I also reviewed KUB today which clearly shows his stones  Cr 1.85 in ED(baseline around 1), UA unremarkable. Cr improved some today  At the time of my exam, patient is resting comfortably in bed. He is alert and able to answer questions, although most of the history is provided by his wife. His pain is improved, does have some intermittent nausea   Past Medical History:  Diagnosis Date   ACNE ROSACEA 06/27/2009   ACTINIC KERATOSIS, HEAD 04/18/2009   Acute maxillary sinusitis 05/14/2010   ALLERGIC RHINITIS 04/09/2007   Anal fissure 04/07/2016   B12 DEFICIENCY 06/07/2007   BACK PAIN WITH RADICULOPATHY 04/24/2008   Bilateral nephrolithiasis 07/07/2022   CT abd 06/2022 1. Nonobstructive bilateral nephrolithiasis measuring up to 5 mm on the right and 6 mm on the left.   Cancer [facility])    skin   CHEST WALL PAIN, ACUTE 06/15/2009   Chronic maxillary sinusitis 05/29/2008   COLITIS 04/27/2009   COLONIC POLYPS, HX OF 04/27/2009   tubular adenomas   Coronary artery disease 05/12/2014   Cath 05/12/2014 w/ severe 3-vessel CAD and preserved LV function, EF 55%   DERMATITIS, ATOPIC 10/12/2007   Diverticular disease 07/07/2022   CT abd 06/2022 . Colonic diverticulosis with no acute diverticulitis.   DIVERTICULOSIS, COLON 04/27/2009   ECCHYMOSES, SPONTANEOUS 06/27/2008   Elevated sedimentation rate 05/02/2009   ESOPHAGEAL STRICTURE 04/27/2009   GASTRITIS, CHRONIC 04/27/2009   HYPERLIPIDEMIA 03/13/2008   HYPERTENSION 04/09/2007   Hyperthyroidism    Iliac aneurysm (HCC) 07/07/2022   CT abd 06/2022 . Aneurysmal left common iliac artery (1.6 cm).     Internal  bleeding hemorrhoids 01/22/2015   01/22/2015 Seen at anoscopy, grade 1 all 3 positions    Irritable bowel syndrome 08/11/2007   KIDNEY DISEASE 04/09/2007   NECK PAIN 09/14/2008   NEUROPATHY, IDIOPATHIC PERIPHERAL NEC 08/11/2007   OSTEOARTHRITIS, WRIST, RIGHT 08/05/2010   Postoperative delirium 05/20/2014   S/P CABG x 4 05/19/2014   LIMA to LAD, SVG to diag, SVG to OM, SVG to PDA, EVH via right thigh and leg    Past Surgical History:  Procedure Laterality Date   CARDIAC CATHETERIZATION     COLONOSCOPY     CORONARY ARTERY BYPASS GRAFT N/A 05/19/2014   Procedure: CORONARY ARTERY BYPASS GRAFTING (CABG);  Surgeon: Purcell Nails, MD;  Location: [facility] OR;  Service: Open Heart Surgery;  Laterality: N/A;  Times 4 using left internal mammary artery and endoscopically harvested right saphenous vein   ESOPHAGOGASTRODUODENOSCOPY     FINGER SURGERY     cut off end of finger   FLEXIBLE SIGMOIDOSCOPY     HEMORRHOID BANDING     HERNIA REPAIR     INCISION AND DRAINAGE WOUND WITH FOREIGN BODY REMOVAL Left 12/20/2013   Procedure: INCISION AND DRAINAGE LEFT INDEX FINGER;  Surgeon: Tami Ribas, MD;  Location: [facility] ORS;  Service: Orthopedics;  Laterality: Left;   INTRAOPERATIVE TRANSESOPHAGEAL ECHOCARDIOGRAM N/A 05/19/2014   Procedure: INTRAOPERATIVE TRANSESOPHAGEAL ECHOCARDIOGRAM;  Surgeon: Purcell Nails, MD;  Location: [facility] OR;  Service: Open Heart Surgery;  Laterality: N/A;   lamenectomy  LEFT HEART CATHETERIZATION WITH CORONARY ANGIOGRAM N/A 05/12/2014   Procedure: LEFT HEART CATHETERIZATION WITH CORONARY ANGIOGRAM;  Surgeon: Lesleigh Noe, MD;  Location: [facility] CATH LAB;  Service: Cardiovascular;  Laterality: N/A;   LUMBAR FUSION     TONSILLECTOMY     VARICOSE VEIN SURGERY Left     Current Hospital Medications:  Home Meds:  No current facility-administered medications on file prior to encounter.   Current Outpatient Medications on File Prior to Encounter  Medication Sig Dispense Refill   aspirin 81  MG EC tablet Take 1 tablet (81 mg total) by mouth daily.     donepezil (ARICEPT) 10 MG tablet Take 1 tablet (10 mg total) by mouth daily. 30 tablet 11   FLUoxetine (PROZAC) 20 MG capsule TAKE 1 CAPSULE(20 MG) BY MOUTH DAILY (Patient taking differently: Take 20 mg by mouth daily.) 90 capsule 3   memantine (NAMENDA) 10 MG tablet Take 1 tablet  twice a day (Patient taking differently: Take 10 mg by mouth 2 (two) times daily. Take 1 tablet  twice a day) 60 tablet 11   methimazole (TAPAZOLE) 5 MG tablet Take 0.5 tablets (2.5 mg total) by mouth daily. (Patient taking differently: Take 2.5 mg by mouth every other day.) 45 tablet 3   Multiple Vitamins-Minerals (CENTRUM SILVER PO) Take 1 tablet by mouth daily.     rosuvastatin (CRESTOR) 40 MG tablet Take 1 tablet (40 mg total) by mouth daily. Dose increase 01/13/22 due to heart disease and LDL goal under 70 90 tablet 3   tamsulosin (FLOMAX) 0.4 MG CAPS capsule Take 0.4 mg by mouth daily. Prescribed by urology     valsartan (DIOVAN) 40 MG tablet Take 1 tablet (40 mg total) by mouth daily. 90 tablet 3     Scheduled Meds:  donepezil  10 mg Oral Daily   enoxaparin (LOVENOX) injection  40 mg Subcutaneous QHS   FLUoxetine  20 mg Oral Daily   irbesartan  37.5 mg Oral Daily   memantine  10 mg Oral BID   methIMAzole  2.5 mg Oral Daily   rosuvastatin  40 mg Oral Daily   sodium chloride flush  3 mL Intravenous Q12H   tamsulosin  0.4 mg Oral BID   Continuous Infusions:  sodium chloride 100 mL/hr at 01/26/24 0815   PRN Meds:.acetaminophen **OR** acetaminophen, ondansetron (ZOFRAN) IV, oxyCODONE, polyethylene glycol  Allergies:  Allergies  Allergen Reactions   Lipitor [Atorvastatin] Other (See Comments)    REACTION: nausea and blurred vision   Trazodone And Nefazodone Other (See Comments)    dizzy   Ciprofloxacin Swelling   Mycophenolate Mofetil Other (See Comments)    REACTION: unspecified   Amoxicillin Rash   Penicillins Rash   Rosuvastatin Other  (See Comments)    Unknown     Family History  Problem Relation Age of Onset   Hernia Mother    Heart disease Father        smoker   COPD Father    Heart attack Father    Heart attack Brother    Early death Daughter    Colon cancer Neg Hx    Esophageal cancer Neg Hx    Pancreatic cancer Neg Hx     Social History:  reports that he quit smoking about 25 years ago. His smoking use included cigarettes. He started smoking about 67 years ago. He has a 21 pack-year smoking history. He has never used smokeless tobacco. He reports that he does not drink alcohol and does not  use drugs.  ROS: A complete review of systems was performed.  All systems are negative except for pertinent findings as noted.  Physical Exam:  Vital signs in last 24 hours: Temp:  [98.1 F (36.7 C)-99 F (37.2 C)] 98.4 F (36.9 C) (03/11 0952) Pulse Rate:  [74-92] 81 (03/11 0952) Resp:  [15-18] 16 (03/11 0952) BP: (118-154)/(63-89) 124/71 (03/11 0952) SpO2:  [93 %-100 %] 93 % (03/11 0952) Constitutional:  Alert and oriented, No acute distress Cardiovascular: Regular rate and rhythm Respiratory: Normal respiratory effort, Lungs clear bilaterally GI: Abdomen is soft, nontender, nondistended, no abdominal masses Neurologic: Grossly intact, no focal deficits Psychiatric: Normal mood and affect  Laboratory Data:  Recent Labs    01/25/24 1149 01/26/24 0444  WBC 7.5 6.8  HGB 12.6* 11.2*  HCT 41.2 35.7*  PLT 170 146*    Recent Labs    01/25/24 1149 01/26/24 0444  NA 140 137  K 4.1 4.3  CL 106 110  GLUCOSE 107* 101*  BUN 25* 22  CALCIUM 8.9 8.2*  CREATININE 1.85* 1.61*     Results for orders placed or performed during the hospital encounter of 01/25/24 (from the past 24 hours)  APTT     Status: None   Collection Time: 01/26/24  4:44 AM  Result Value Ref Range   aPTT 30 24 - 36 seconds  Protime-INR     Status: None   Collection Time: 01/26/24  4:44 AM  Result Value Ref Range   Prothrombin Time  14.5 11.4 - 15.2 seconds   INR 1.1 0.8 - 1.2  Basic metabolic panel     Status: Abnormal   Collection Time: 01/26/24  4:44 AM  Result Value Ref Range   Sodium 137 135 - 145 mmol/L   Potassium 4.3 3.5 - 5.1 mmol/L   Chloride 110 98 - 111 mmol/L   CO2 21 (L) 22 - 32 mmol/L   Glucose, Bld 101 (H) 70 - 99 mg/dL   BUN 22 8 - 23 mg/dL   Creatinine, Ser 0.45 (H) 0.61 - 1.24 mg/dL   Calcium 8.2 (L) 8.9 - 10.3 mg/dL   GFR, Estimated 42 (L) >60 mL/min   Anion gap 6 5 - 15  CBC     Status: Abnormal   Collection Time: 01/26/24  4:44 AM  Result Value Ref Range   WBC 6.8 4.0 - 10.5 K/uL   RBC 4.06 (L) 4.22 - 5.81 MIL/uL   Hemoglobin 11.2 (L) 13.0 - 17.0 g/dL   HCT 40.9 (L) 81.1 - 91.4 %   MCV 87.9 80.0 - 100.0 fL   MCH 27.6 26.0 - 34.0 pg   MCHC 31.4 30.0 - 36.0 g/dL   RDW 78.2 (H) 95.6 - 21.3 %   Platelets 146 (L) 150 - 400 K/uL   nRBC 0.0 0.0 - 0.2 %   *Note: Due to a large number of results and/or encounters for the requested time period, some results have not been displayed. A complete set of results can be found in Results Review.   No results found for this or any previous visit (from the past 240 hours).  Renal Function: Recent Labs    01/25/24 1149 01/26/24 0444  CREATININE 1.85* 1.61*   CrCl cannot be calculated (Unknown ideal weight.).  Radiologic Imaging: DG Abd 1 View Result Date: 01/26/2024 CLINICAL DATA:  Known renal and ureteral stones EXAM: ABDOMEN - 1 VIEW COMPARISON:  CT from the previous day. FINDINGS: Bilateral nonobstructing lower pole renal stones are again  seen and stable from the prior CT. Right UPJ stone is noted and stable. Degenerative changes of lumbar spine are noted. No free air is seen. IMPRESSION: Bilateral renal and right ureteral stones stable from prior CT. Electronically Signed   By: Alcide Clever M.D.   On: 01/26/2024 10:00   US RENAL Result Date: 01/26/2024 CLINICAL DATA:  Follow-up right hydronephrosis EXAM: RENAL / URINARY TRACT ULTRASOUND  COMPLETE COMPARISON:  CT from the previous day. FINDINGS: Right Kidney: Renal measurements: 10.5 x 6.4 x 7.1 cm. = volume: 248 mL. Mild hydronephrosis is noted similar to that seen on the prior exam. Lower pole renal stone measuring up to 1 cm is noted and stable. The known UPJ stone is not well appreciated. Left Kidney: Renal measurements: 11.9 x 5.2 x 5.0 cm. = volume: 163 mL. 7 mm stone is noted in the lower pole similar to that seen on the prior exam cysts are noted in the upper and lower pole similar to that noted on prior CT. Largest of these measures up to 1.9 cm. No further follow-up is recommended. Bladder: Appears normal for degree of bladder distention. Other: None. IMPRESSION: Bilateral nonobstructing renal calculi. Stable right-sided hydronephrosis of a mild degree. Electronically Signed   By: Alcide Clever M.D.   On: 01/26/2024 09:59   CT ABDOMEN PELVIS WO CONTRAST Result Date: 01/25/2024 CLINICAL DATA:  Right lower quadrant abdominal pain. Concern for acute appendicitis. EXAM: CT ABDOMEN AND PELVIS WITHOUT CONTRAST TECHNIQUE: Multidetector CT imaging of the abdomen and pelvis was performed following the standard protocol without IV contrast. RADIATION DOSE REDUCTION: This exam was performed according to the departmental dose-optimization program which includes automated exposure control, adjustment of the mA and/or kV according to patient size and/or use of iterative reconstruction technique. COMPARISON:  CT abdomen pelvis dated 07/03/2022. FINDINGS: Evaluation of this exam is limited in the absence of intravenous contrast. Lower chest: The visualized lung bases are clear. There is coronary vascular calcification. No intra-abdominal free air or free fluid. Hepatobiliary: Subcentimeter hypodense lesions in the liver are too small to characterize but stable since the prior CT, likely cysts or hemangioma. No biliary ductal dilatation. The gallbladder is unremarkable. Pancreas: Unremarkable. No  pancreatic ductal dilatation or surrounding inflammatory changes. Spleen: Normal in size without focal abnormality. Adrenals/Urinary Tract: The adrenal glands unremarkable. There is a 7 mm stone in the proximal right ureter at the ureteropelvic junction with mild right hydronephrosis. Several additional nonobstructing right renal calculi measure up to 6 mm in the interpolar kidney and 7 mm in the lower pole. Multiple nonobstructing left renal calculi measure up to 10 mm in the inferior pole. There is no hydronephrosis on the left. Left renal cysts suboptimally evaluated on this noncontrast CT but present on the prior CT. Mildly trabeculated bladder wall suggestive of chronic bladder outlet obstruction. Stomach/Bowel: There is sigmoid diverticulosis. Moderate amount of stool throughout the colon. There is no bowel obstruction or active inflammation. The appendix is normal. Vascular/Lymphatic: Advanced aortoiliac atherosclerotic disease. There is a 4 cm infrarenal abdominal aortic aneurysm. The IVC is unremarkable. No portal venous gas. There is no adenopathy. Reproductive: Enlarged prostate gland measuring 5.8 cm in transverse axial diameter. The seminal vesicles are symmetric. Other: Small fat containing umbilical and left inguinal hernias. Musculoskeletal: Osteopenia with degenerative changes. No acute osseous pathology. IMPRESSION: 1. A 7 mm right UPJ stone with mild right hydronephrosis. 2. Additional nonobstructing bilateral renal calculi. 3. Sigmoid diverticulosis. No bowel obstruction. Normal appendix. 4. Enlarged prostate gland. 5.  A 4 cm infrarenal abdominal aortic aneurysm. Recommend follow-up CT or MR as appropriate in 12 months and referral to or continued care with vascular specialist. (Ref.: J Vasc Surg. 2018; 67:2-77 and J Am Coll Radiol 2013;10(10):789-794.) Electronically Signed   By: Elgie Collard M.D.   On: 01/25/2024 18:49    I independently reviewed the above imaging  studies.  Impression/Recommendation 88 yo M with obstructing right ureteral stone, AKI. Cr improved some today. Discussed options and ultimately patient and wife are most interested in ESWL for the stone. We reviewed the procedure in detail, including risks. This will likely be done on Friday.   Dispo per medicine, greatly appreciate their co-management  Irine Seal MD 01/26/2024, 12:24 PM  [facility]  Pager: [phone]

## 2024-01-25 NOTE — ED Notes (Signed)
 Provided patient with urinal at this time. Family at bedside and will be leaving shortly

## 2024-01-25 NOTE — H&P (View-Only) (Signed)
 History and Physical    Patient: Timothy Gallagher BJY:[id number] DOB: Nov 06, 1936 DOA: 01/25/2024 DOS: the patient was seen and examined on 01/25/2024 PCP: [name], MD  Patient coming from: Home > urgent care > [facility] ER  Chief Complaint:  Chief Complaint  Patient presents with   Abdominal Pain   HPI: Timothy Gallagher is a 88 y.o. male with medical history significant of chronic cognitive deficit. History from signtou and recrd review.  Patinet has been having abd pain (RLQ) since Friday (three days ago). Gradual onset. Comes and goes and resolved completely between bouts. A/w nasuea severe  No report of fever, diahrea.   Patientn initialy presetned to urgent care facily, felt ot have right lower Quad tenderness. And trnafered to [facility] ER for CT scan t.r.o appendicitis  CT scan here shows : IMPRESSION: 1. A 7 mm right UPJ stone with mild right hydronephrosis. 2. Additional nonobstructing bilateral renal calculi. 3. Sigmoid diverticulosis. No bowel obstruction. Normal appendix. 4. Enlarged prostate gland. 5. A 4 cm infrarenal abdominal aortic aneurysm. Recommend follow-up CT or MR as appropriate in 12 months and referral to or continued care with vascular specialist. (Ref.: J Vasc Surg. 2018; 67:2-77 and J Am Coll Radiol 2013;10(10):789-794.)   Urology is engaged. Medical eval is sought.  Patinet is curently totally pain free /asymptaotmic.   Review of Systems: As mentioned in the history of present illness. All other systems reviewed and are negative. Past Medical History:  Diagnosis Date   ACNE ROSACEA 06/27/2009   ACTINIC KERATOSIS, HEAD 04/18/2009   Acute maxillary sinusitis 05/14/2010   ALLERGIC RHINITIS 04/09/2007   Anal fissure 04/07/2016   B12 DEFICIENCY 06/07/2007   BACK PAIN WITH RADICULOPATHY 04/24/2008   Bilateral nephrolithiasis 07/07/2022   CT abd 06/2022 1. Nonobstructive bilateral nephrolithiasis measuring up to 5 mm on the right and 6 mm on the left.    Cancer [facility])    skin   CHEST WALL PAIN, ACUTE 06/15/2009   Chronic maxillary sinusitis 05/29/2008   COLITIS 04/27/2009   COLONIC POLYPS, HX OF 04/27/2009   tubular adenomas   Coronary artery disease 05/12/2014   Cath 05/12/2014 w/ severe 3-vessel CAD and preserved LV function, EF 55%   DERMATITIS, ATOPIC 10/12/2007   Diverticular disease 07/07/2022   CT abd 06/2022 . Colonic diverticulosis with no acute diverticulitis.   DIVERTICULOSIS, COLON 04/27/2009   ECCHYMOSES, SPONTANEOUS 06/27/2008   Elevated sedimentation rate 05/02/2009   ESOPHAGEAL STRICTURE 04/27/2009   GASTRITIS, CHRONIC 04/27/2009   HYPERLIPIDEMIA 03/13/2008   HYPERTENSION 04/09/2007   Hyperthyroidism    Iliac aneurysm (HCC) 07/07/2022   CT abd 06/2022 . Aneurysmal left common iliac artery (1.6 cm).     Internal bleeding hemorrhoids 01/22/2015   01/22/2015 Seen at anoscopy, grade 1 all 3 positions    Irritable bowel syndrome 08/11/2007   KIDNEY DISEASE 04/09/2007   NECK PAIN 09/14/2008   NEUROPATHY, IDIOPATHIC PERIPHERAL NEC 08/11/2007   OSTEOARTHRITIS, WRIST, RIGHT 08/05/2010   Postoperative delirium 05/20/2014   S/P CABG x 4 05/19/2014   LIMA to LAD, SVG to diag, SVG to OM, SVG to PDA, EVH via right thigh and leg   Past Surgical History:  Procedure Laterality Date   CARDIAC CATHETERIZATION     COLONOSCOPY     CORONARY ARTERY BYPASS GRAFT N/A 05/19/2014   Procedure: CORONARY ARTERY BYPASS GRAFTING (CABG);  Surgeon: Purcell Nails, MD;  Location: [facility] OR;  Service: Open Heart Surgery;  Laterality: N/A;  Times 4 using left internal mammary  artery and endoscopically harvested right saphenous vein   ESOPHAGOGASTRODUODENOSCOPY     FINGER SURGERY     cut off end of finger   FLEXIBLE SIGMOIDOSCOPY     HEMORRHOID BANDING     HERNIA REPAIR     INCISION AND DRAINAGE WOUND WITH FOREIGN BODY REMOVAL Left 12/20/2013   Procedure: INCISION AND DRAINAGE LEFT INDEX FINGER;  Surgeon: Tami Ribas, MD;  Location: [facility] ORS;  Service:  Orthopedics;  Laterality: Left;   INTRAOPERATIVE TRANSESOPHAGEAL ECHOCARDIOGRAM N/A 05/19/2014   Procedure: INTRAOPERATIVE TRANSESOPHAGEAL ECHOCARDIOGRAM;  Surgeon: Purcell Nails, MD;  Location: [facility] OR;  Service: Open Heart Surgery;  Laterality: N/A;   lamenectomy     LEFT HEART CATHETERIZATION WITH CORONARY ANGIOGRAM N/A 05/12/2014   Procedure: LEFT HEART CATHETERIZATION WITH CORONARY ANGIOGRAM;  Surgeon: Lesleigh Noe, MD;  Location: [facility] CATH LAB;  Service: Cardiovascular;  Laterality: N/A;   LUMBAR FUSION     TONSILLECTOMY     VARICOSE VEIN SURGERY Left    Social History:  reports that he quit smoking about 25 years ago. His smoking use included cigarettes. He started smoking about 67 years ago. He has a 21 pack-year smoking history. He has never used smokeless tobacco. He reports that he does not drink alcohol and does not use drugs.  Allergies  Allergen Reactions   Lipitor [Atorvastatin] Other (See Comments)    REACTION: nausea and blurred vision   Trazodone And Nefazodone Other (See Comments)    dizzy   Ciprofloxacin Swelling   Mycophenolate Mofetil Other (See Comments)    REACTION: unspecified   Amoxicillin Rash    Has patient had a PCN reaction causing immediate rash, facial/tongue/throat swelling, SOB or lightheadedness with hypotension: Unknown Has patient had a PCN reaction causing severe rash involving mucus membranes or skin necrosis: Unknown Has patient had a PCN reaction that required hospitalization: Unknown Has patient had a PCN reaction occurring within the last 10 years: Unknown If all of the above answers are "NO", then may proceed with Cephalosporin use.    Penicillins Rash    Has patient had a PCN reaction causing immediate rash, facial/tongue/throat swelling, SOB or lightheadedness with hypotension: Unknown Has patient had a PCN reaction causing severe rash involving mucus membranes or skin necrosis: Unknown Has patient had a PCN reaction that required  hospitalization: Unknown Has patient had a PCN reaction occurring within the last 10 years: Unknown If all of the above answers are "NO", then may proceed with Cephalosporin use.    Rosuvastatin Other (See Comments)    Unknown     Family History  Problem Relation Age of Onset   Hernia Mother    Heart disease Father        smoker   COPD Father    Heart attack Father    Heart attack Brother    Early death Daughter    Colon cancer Neg Hx    Esophageal cancer Neg Hx    Pancreatic cancer Neg Hx     Prior to Admission medications   Medication Sig Start Date End Date Taking? Authorizing Provider  aspirin 81 MG EC tablet Take 1 tablet (81 mg total) by mouth daily. 06/19/15   Lyn Records, MD  donepezil (ARICEPT) 10 MG tablet Take 1 tablet (10 mg total) by mouth daily. 12/25/23   Marcos Eke, PA-C  FLUoxetine (PROZAC) 20 MG capsule TAKE 1 CAPSULE(20 MG) BY MOUTH DAILY 12/01/23   [name], MD  memantine (NAMENDA) 10 MG  tablet Take 1 tablet  twice a day 09/09/23   Marcos Eke, PA-C  methimazole (TAPAZOLE) 5 MG tablet Take 0.5 tablets (2.5 mg total) by mouth daily. 09/04/23   Carlus Pavlov, MD  Multiple Vitamins-Minerals (CENTRUM SILVER PO) Take 1 tablet by mouth daily.    [provider]  rosuvastatin (CRESTOR) 40 MG tablet Take 1 tablet (40 mg total) by mouth daily. Dose increase 01/13/22 due to heart disease and LDL goal under 70 04/07/23   [name], MD  tamsulosin (FLOMAX) 0.4 MG CAPS capsule Take 0.4 mg by mouth. Prescribed by urology    [provider]  valsartan (DIOVAN) 40 MG tablet Take 1 tablet (40 mg total) by mouth daily. 01/15/24   [name], MD    Physical Exam: Vitals:   01/25/24 1131 01/25/24 1437 01/25/24 1809  BP: (!) 138/95 137/81 118/71  Pulse: 75 74 75  Resp: 16 18 16   Temp: 98.7 F (37.1 C) 99 F (37.2 C) 98.1 F (36.7 C)  TempSrc: Oral Oral Oral  SpO2: 97% 97% 100%   General: Patient is alert and awake, gives  name and date of birth.  However is unable to recall events of the day.  Appears to be in no distress.  Totally pain-free Respiratory exam: Bilateral intravesicular Cardiovascular exam S1-S2 normal Abdomen all quadrant soft nontender Extremities warm without edema. Right CVA purcussion tenderness. Data Reviewed:  Labs on Admission:  Results for orders placed or performed during the hospital encounter of 01/25/24 (from the past 24 hours)  Lipase, blood     Status: None   Collection Time: 01/25/24 11:49 AM  Result Value Ref Range   Lipase 32 11 - 51 U/L  Comprehensive metabolic panel     Status: Abnormal   Collection Time: 01/25/24 11:49 AM  Result Value Ref Range   Sodium 140 135 - 145 mmol/L   Potassium 4.1 3.5 - 5.1 mmol/L   Chloride 106 98 - 111 mmol/L   CO2 24 22 - 32 mmol/L   Glucose, Bld 107 (H) 70 - 99 mg/dL   BUN 25 (H) 8 - 23 mg/dL   Creatinine, Ser 2.72 (H) 0.61 - 1.24 mg/dL   Calcium 8.9 8.9 - 53.6 mg/dL   Total Protein 7.3 6.5 - 8.1 g/dL   Albumin 3.5 3.5 - 5.0 g/dL   AST 37 15 - 41 U/L   ALT 22 0 - 44 U/L   Alkaline Phosphatase 65 38 - 126 U/L   Total Bilirubin 0.9 0.0 - 1.2 mg/dL   GFR, Estimated 35 (L) >60 mL/min   Anion gap 10 5 - 15  CBC     Status: Abnormal   Collection Time: 01/25/24 11:49 AM  Result Value Ref Range   WBC 7.5 4.0 - 10.5 K/uL   RBC 4.61 4.22 - 5.81 MIL/uL   Hemoglobin 12.6 (L) 13.0 - 17.0 g/dL   HCT 64.4 03.4 - 74.2 %   MCV 89.4 80.0 - 100.0 fL   MCH 27.3 26.0 - 34.0 pg   MCHC 30.6 30.0 - 36.0 g/dL   RDW 59.5 (H) 63.8 - 75.6 %   Platelets 170 150 - 400 K/uL   nRBC 0.0 0.0 - 0.2 %  Urinalysis, Routine w reflex microscopic -Urine, Clean Catch     Status: Abnormal   Collection Time: 01/25/24 11:49 AM  Result Value Ref Range   Color, Urine YELLOW YELLOW   APPearance CLEAR CLEAR   Specific Gravity, Urine 1.019 1.005 -  1.030   pH 5.0 5.0 - 8.0   Glucose, UA NEGATIVE NEGATIVE mg/dL   Hgb urine dipstick SMALL (A) NEGATIVE   Bilirubin  Urine NEGATIVE NEGATIVE   Ketones, ur NEGATIVE NEGATIVE mg/dL   Protein, ur 161 (A) NEGATIVE mg/dL   Nitrite NEGATIVE NEGATIVE   Leukocytes,Ua NEGATIVE NEGATIVE   RBC / HPF 21-50 0 - 5 RBC/hpf   WBC, UA 0-5 0 - 5 WBC/hpf   Bacteria, UA NONE SEEN NONE SEEN   Squamous Epithelial / HPF 0-5 0 - 5 /HPF   Mucus PRESENT    *Note: Due to a large number of results and/or encounters for the requested time period, some results have not been displayed. A complete set of results can be found in Results Review.   Basic Metabolic Panel: Recent Labs  Lab 01/25/24 1149  NA 140  K 4.1  CL 106  CO2 24  GLUCOSE 107*  BUN 25*  CREATININE 1.85*  CALCIUM 8.9   Liver Function Tests: Recent Labs  Lab 01/25/24 1149  AST 37  ALT 22  ALKPHOS 65  BILITOT 0.9  PROT 7.3  ALBUMIN 3.5   Recent Labs  Lab 01/25/24 1149  LIPASE 32   No results for input(s): "AMMONIA" in the last 168 hours. CBC: Recent Labs  Lab 01/25/24 1149  WBC 7.5  HGB 12.6*  HCT 41.2  MCV 89.4  PLT 170   Cardiac Enzymes: No results for input(s): "CKTOTAL", "CKMB", "CKMBINDEX", "TROPONINIHS" in the last 168 hours.  BNP (last 3 results) No results for input(s): "PROBNP" in the last 8760 hours. CBG: No results for input(s): "GLUCAP" in the last 168 hours.  Radiological Exams on Admission:  CT ABDOMEN PELVIS WO CONTRAST Result Date: 01/25/2024 CLINICAL DATA:  Right lower quadrant abdominal pain. Concern for acute appendicitis. EXAM: CT ABDOMEN AND PELVIS WITHOUT CONTRAST TECHNIQUE: Multidetector CT imaging of the abdomen and pelvis was performed following the standard protocol without IV contrast. RADIATION DOSE REDUCTION: This exam was performed according to the departmental dose-optimization program which includes automated exposure control, adjustment of the mA and/or kV according to patient size and/or use of iterative reconstruction technique. COMPARISON:  CT abdomen pelvis dated 07/03/2022. FINDINGS: Evaluation of  this exam is limited in the absence of intravenous contrast. Lower chest: The visualized lung bases are clear. There is coronary vascular calcification. No intra-abdominal free air or free fluid. Hepatobiliary: Subcentimeter hypodense lesions in the liver are too small to characterize but stable since the prior CT, likely cysts or hemangioma. No biliary ductal dilatation. The gallbladder is unremarkable. Pancreas: Unremarkable. No pancreatic ductal dilatation or surrounding inflammatory changes. Spleen: Normal in size without focal abnormality. Adrenals/Urinary Tract: The adrenal glands unremarkable. There is a 7 mm stone in the proximal right ureter at the ureteropelvic junction with mild right hydronephrosis. Several additional nonobstructing right renal calculi measure up to 6 mm in the interpolar kidney and 7 mm in the lower pole. Multiple nonobstructing left renal calculi measure up to 10 mm in the inferior pole. There is no hydronephrosis on the left. Left renal cysts suboptimally evaluated on this noncontrast CT but present on the prior CT. Mildly trabeculated bladder wall suggestive of chronic bladder outlet obstruction. Stomach/Bowel: There is sigmoid diverticulosis. Moderate amount of stool throughout the colon. There is no bowel obstruction or active inflammation. The appendix is normal. Vascular/Lymphatic: Advanced aortoiliac atherosclerotic disease. There is a 4 cm infrarenal abdominal aortic aneurysm. The IVC is unremarkable. No portal venous gas. There is no adenopathy.  Reproductive: Enlarged prostate gland measuring 5.8 cm in transverse axial diameter. The seminal vesicles are symmetric. Other: Small fat containing umbilical and left inguinal hernias. Musculoskeletal: Osteopenia with degenerative changes. No acute osseous pathology. IMPRESSION: 1. A 7 mm right UPJ stone with mild right hydronephrosis. 2. Additional nonobstructing bilateral renal calculi. 3. Sigmoid diverticulosis. No bowel  obstruction. Normal appendix. 4. Enlarged prostate gland. 5. A 4 cm infrarenal abdominal aortic aneurysm. Recommend follow-up CT or MR as appropriate in 12 months and referral to or continued care with vascular specialist. (Ref.: J Vasc Surg. 2018; 67:2-77 and J Am Coll Radiol 2013;10(10):789-794.) Electronically Signed   By: Elgie Collard M.D.   On: 01/25/2024 18:49     No intake/output data recorded. No intake/output data recorded.      Assessment and Plan: Hydronephrosis Patient presented with right-sided ureteric colic noted to have acute kidney injury as well as right sided have hydronephrosis due to subcentimeter renal stone on CAT scan.  Patient currently has had resolution of symptoms.  At this time I will start the patient on tamsulosin, patient is s/p 1 L normal saline in the ER, continue with 125 cc/h tonight.  I will repeat an ultrasound in the morning to see if hydronephrosis has resolved. Urology engaged by ER provider note pending. No concern for infection at this time. Microscopic hematuria. Tamsulosin ordered  AAA (abdominal aortic aneurysm) (HCC) Previously known.  Currently at 4 cm.  Follow-up ultrasound routinly   Med rec pending pharmcy input.    Advance Care Planning:   Code Status: Prior fulll code.  Consults: urology engaged.   Severity of Illness: The appropriate patient status for this patient is OBSERVATION. Observation status is judged to be reasonable and necessary in order to provide the required intensity of service to ensure the patient's safety. The patient's presenting symptoms, physical exam findings, and initial radiographic and laboratory data in the context of their medical condition is felt to place them at decreased risk for further clinical deterioration. Furthermore, it is anticipated that the patient will be medically stable for discharge from the hospital within 2 midnights of admission.   Author: Nolberto Hanlon, MD 01/25/2024 9:28 PM  For on  call review [email or web address].

## 2024-01-25 NOTE — H&P (Signed)
 History and Physical    Patient: Timothy Gallagher BJY:[id number] DOB: Nov 06, 1936 DOA: 01/25/2024 DOS: the patient was seen and examined on 01/25/2024 PCP: [name], MD  Patient coming from: Home > urgent care > [facility] ER  Chief Complaint:  Chief Complaint  Patient presents with   Abdominal Pain   HPI: Timothy Gallagher is a 88 y.o. male with medical history significant of chronic cognitive deficit. History from signtou and recrd review.  Patinet has been having abd pain (RLQ) since Friday (three days ago). Gradual onset. Comes and goes and resolved completely between bouts. A/w nasuea severe  No report of fever, diahrea.   Patientn initialy presetned to urgent care facily, felt ot have right lower Quad tenderness. And trnafered to [facility] ER for CT scan t.r.o appendicitis  CT scan here shows : IMPRESSION: 1. A 7 mm right UPJ stone with mild right hydronephrosis. 2. Additional nonobstructing bilateral renal calculi. 3. Sigmoid diverticulosis. No bowel obstruction. Normal appendix. 4. Enlarged prostate gland. 5. A 4 cm infrarenal abdominal aortic aneurysm. Recommend follow-up CT or MR as appropriate in 12 months and referral to or continued care with vascular specialist. (Ref.: J Vasc Surg. 2018; 67:2-77 and J Am Coll Radiol 2013;10(10):789-794.)   Urology is engaged. Medical eval is sought.  Patinet is curently totally pain free /asymptaotmic.   Review of Systems: As mentioned in the history of present illness. All other systems reviewed and are negative. Past Medical History:  Diagnosis Date   ACNE ROSACEA 06/27/2009   ACTINIC KERATOSIS, HEAD 04/18/2009   Acute maxillary sinusitis 05/14/2010   ALLERGIC RHINITIS 04/09/2007   Anal fissure 04/07/2016   B12 DEFICIENCY 06/07/2007   BACK PAIN WITH RADICULOPATHY 04/24/2008   Bilateral nephrolithiasis 07/07/2022   CT abd 06/2022 1. Nonobstructive bilateral nephrolithiasis measuring up to 5 mm on the right and 6 mm on the left.    Cancer [facility])    skin   CHEST WALL PAIN, ACUTE 06/15/2009   Chronic maxillary sinusitis 05/29/2008   COLITIS 04/27/2009   COLONIC POLYPS, HX OF 04/27/2009   tubular adenomas   Coronary artery disease 05/12/2014   Cath 05/12/2014 w/ severe 3-vessel CAD and preserved LV function, EF 55%   DERMATITIS, ATOPIC 10/12/2007   Diverticular disease 07/07/2022   CT abd 06/2022 . Colonic diverticulosis with no acute diverticulitis.   DIVERTICULOSIS, COLON 04/27/2009   ECCHYMOSES, SPONTANEOUS 06/27/2008   Elevated sedimentation rate 05/02/2009   ESOPHAGEAL STRICTURE 04/27/2009   GASTRITIS, CHRONIC 04/27/2009   HYPERLIPIDEMIA 03/13/2008   HYPERTENSION 04/09/2007   Hyperthyroidism    Iliac aneurysm (HCC) 07/07/2022   CT abd 06/2022 . Aneurysmal left common iliac artery (1.6 cm).     Internal bleeding hemorrhoids 01/22/2015   01/22/2015 Seen at anoscopy, grade 1 all 3 positions    Irritable bowel syndrome 08/11/2007   KIDNEY DISEASE 04/09/2007   NECK PAIN 09/14/2008   NEUROPATHY, IDIOPATHIC PERIPHERAL NEC 08/11/2007   OSTEOARTHRITIS, WRIST, RIGHT 08/05/2010   Postoperative delirium 05/20/2014   S/P CABG x 4 05/19/2014   LIMA to LAD, SVG to diag, SVG to OM, SVG to PDA, EVH via right thigh and leg   Past Surgical History:  Procedure Laterality Date   CARDIAC CATHETERIZATION     COLONOSCOPY     CORONARY ARTERY BYPASS GRAFT N/A 05/19/2014   Procedure: CORONARY ARTERY BYPASS GRAFTING (CABG);  Surgeon: Purcell Nails, MD;  Location: [facility] OR;  Service: Open Heart Surgery;  Laterality: N/A;  Times 4 using left internal mammary  artery and endoscopically harvested right saphenous vein   ESOPHAGOGASTRODUODENOSCOPY     FINGER SURGERY     cut off end of finger   FLEXIBLE SIGMOIDOSCOPY     HEMORRHOID BANDING     HERNIA REPAIR     INCISION AND DRAINAGE WOUND WITH FOREIGN BODY REMOVAL Left 12/20/2013   Procedure: INCISION AND DRAINAGE LEFT INDEX FINGER;  Surgeon: Tami Ribas, MD;  Location: [facility] ORS;  Service:  Orthopedics;  Laterality: Left;   INTRAOPERATIVE TRANSESOPHAGEAL ECHOCARDIOGRAM N/A 05/19/2014   Procedure: INTRAOPERATIVE TRANSESOPHAGEAL ECHOCARDIOGRAM;  Surgeon: Purcell Nails, MD;  Location: [facility] OR;  Service: Open Heart Surgery;  Laterality: N/A;   lamenectomy     LEFT HEART CATHETERIZATION WITH CORONARY ANGIOGRAM N/A 05/12/2014   Procedure: LEFT HEART CATHETERIZATION WITH CORONARY ANGIOGRAM;  Surgeon: Lesleigh Noe, MD;  Location: [facility] CATH LAB;  Service: Cardiovascular;  Laterality: N/A;   LUMBAR FUSION     TONSILLECTOMY     VARICOSE VEIN SURGERY Left    Social History:  reports that he quit smoking about 25 years ago. His smoking use included cigarettes. He started smoking about 67 years ago. He has a 21 pack-year smoking history. He has never used smokeless tobacco. He reports that he does not drink alcohol and does not use drugs.  Allergies  Allergen Reactions   Lipitor [Atorvastatin] Other (See Comments)    REACTION: nausea and blurred vision   Trazodone And Nefazodone Other (See Comments)    dizzy   Ciprofloxacin Swelling   Mycophenolate Mofetil Other (See Comments)    REACTION: unspecified   Amoxicillin Rash    Has patient had a PCN reaction causing immediate rash, facial/tongue/throat swelling, SOB or lightheadedness with hypotension: Unknown Has patient had a PCN reaction causing severe rash involving mucus membranes or skin necrosis: Unknown Has patient had a PCN reaction that required hospitalization: Unknown Has patient had a PCN reaction occurring within the last 10 years: Unknown If all of the above answers are "NO", then may proceed with Cephalosporin use.    Penicillins Rash    Has patient had a PCN reaction causing immediate rash, facial/tongue/throat swelling, SOB or lightheadedness with hypotension: Unknown Has patient had a PCN reaction causing severe rash involving mucus membranes or skin necrosis: Unknown Has patient had a PCN reaction that required  hospitalization: Unknown Has patient had a PCN reaction occurring within the last 10 years: Unknown If all of the above answers are "NO", then may proceed with Cephalosporin use.    Rosuvastatin Other (See Comments)    Unknown     Family History  Problem Relation Age of Onset   Hernia Mother    Heart disease Father        smoker   COPD Father    Heart attack Father    Heart attack Brother    Early death Daughter    Colon cancer Neg Hx    Esophageal cancer Neg Hx    Pancreatic cancer Neg Hx     Prior to Admission medications   Medication Sig Start Date End Date Taking? Authorizing Provider  aspirin 81 MG EC tablet Take 1 tablet (81 mg total) by mouth daily. 06/19/15   Lyn Records, MD  donepezil (ARICEPT) 10 MG tablet Take 1 tablet (10 mg total) by mouth daily. 12/25/23   Marcos Eke, PA-C  FLUoxetine (PROZAC) 20 MG capsule TAKE 1 CAPSULE(20 MG) BY MOUTH DAILY 12/01/23   [name], MD  memantine (NAMENDA) 10 MG  tablet Take 1 tablet  twice a day 09/09/23   Marcos Eke, PA-C  methimazole (TAPAZOLE) 5 MG tablet Take 0.5 tablets (2.5 mg total) by mouth daily. 09/04/23   Carlus Pavlov, MD  Multiple Vitamins-Minerals (CENTRUM SILVER PO) Take 1 tablet by mouth daily.    [provider]  rosuvastatin (CRESTOR) 40 MG tablet Take 1 tablet (40 mg total) by mouth daily. Dose increase 01/13/22 due to heart disease and LDL goal under 70 04/07/23   [name], MD  tamsulosin (FLOMAX) 0.4 MG CAPS capsule Take 0.4 mg by mouth. Prescribed by urology    [provider]  valsartan (DIOVAN) 40 MG tablet Take 1 tablet (40 mg total) by mouth daily. 01/15/24   [name], MD    Physical Exam: Vitals:   01/25/24 1131 01/25/24 1437 01/25/24 1809  BP: (!) 138/95 137/81 118/71  Pulse: 75 74 75  Resp: 16 18 16   Temp: 98.7 F (37.1 C) 99 F (37.2 C) 98.1 F (36.7 C)  TempSrc: Oral Oral Oral  SpO2: 97% 97% 100%   General: Patient is alert and awake, gives  name and date of birth.  However is unable to recall events of the day.  Appears to be in no distress.  Totally pain-free Respiratory exam: Bilateral intravesicular Cardiovascular exam S1-S2 normal Abdomen all quadrant soft nontender Extremities warm without edema. Right CVA purcussion tenderness. Data Reviewed:  Labs on Admission:  Results for orders placed or performed during the hospital encounter of 01/25/24 (from the past 24 hours)  Lipase, blood     Status: None   Collection Time: 01/25/24 11:49 AM  Result Value Ref Range   Lipase 32 11 - 51 U/L  Comprehensive metabolic panel     Status: Abnormal   Collection Time: 01/25/24 11:49 AM  Result Value Ref Range   Sodium 140 135 - 145 mmol/L   Potassium 4.1 3.5 - 5.1 mmol/L   Chloride 106 98 - 111 mmol/L   CO2 24 22 - 32 mmol/L   Glucose, Bld 107 (H) 70 - 99 mg/dL   BUN 25 (H) 8 - 23 mg/dL   Creatinine, Ser 2.72 (H) 0.61 - 1.24 mg/dL   Calcium 8.9 8.9 - 53.6 mg/dL   Total Protein 7.3 6.5 - 8.1 g/dL   Albumin 3.5 3.5 - 5.0 g/dL   AST 37 15 - 41 U/L   ALT 22 0 - 44 U/L   Alkaline Phosphatase 65 38 - 126 U/L   Total Bilirubin 0.9 0.0 - 1.2 mg/dL   GFR, Estimated 35 (L) >60 mL/min   Anion gap 10 5 - 15  CBC     Status: Abnormal   Collection Time: 01/25/24 11:49 AM  Result Value Ref Range   WBC 7.5 4.0 - 10.5 K/uL   RBC 4.61 4.22 - 5.81 MIL/uL   Hemoglobin 12.6 (L) 13.0 - 17.0 g/dL   HCT 64.4 03.4 - 74.2 %   MCV 89.4 80.0 - 100.0 fL   MCH 27.3 26.0 - 34.0 pg   MCHC 30.6 30.0 - 36.0 g/dL   RDW 59.5 (H) 63.8 - 75.6 %   Platelets 170 150 - 400 K/uL   nRBC 0.0 0.0 - 0.2 %  Urinalysis, Routine w reflex microscopic -Urine, Clean Catch     Status: Abnormal   Collection Time: 01/25/24 11:49 AM  Result Value Ref Range   Color, Urine YELLOW YELLOW   APPearance CLEAR CLEAR   Specific Gravity, Urine 1.019 1.005 -  1.030   pH 5.0 5.0 - 8.0   Glucose, UA NEGATIVE NEGATIVE mg/dL   Hgb urine dipstick SMALL (A) NEGATIVE   Bilirubin  Urine NEGATIVE NEGATIVE   Ketones, ur NEGATIVE NEGATIVE mg/dL   Protein, ur 161 (A) NEGATIVE mg/dL   Nitrite NEGATIVE NEGATIVE   Leukocytes,Ua NEGATIVE NEGATIVE   RBC / HPF 21-50 0 - 5 RBC/hpf   WBC, UA 0-5 0 - 5 WBC/hpf   Bacteria, UA NONE SEEN NONE SEEN   Squamous Epithelial / HPF 0-5 0 - 5 /HPF   Mucus PRESENT    *Note: Due to a large number of results and/or encounters for the requested time period, some results have not been displayed. A complete set of results can be found in Results Review.   Basic Metabolic Panel: Recent Labs  Lab 01/25/24 1149  NA 140  K 4.1  CL 106  CO2 24  GLUCOSE 107*  BUN 25*  CREATININE 1.85*  CALCIUM 8.9   Liver Function Tests: Recent Labs  Lab 01/25/24 1149  AST 37  ALT 22  ALKPHOS 65  BILITOT 0.9  PROT 7.3  ALBUMIN 3.5   Recent Labs  Lab 01/25/24 1149  LIPASE 32   No results for input(s): "AMMONIA" in the last 168 hours. CBC: Recent Labs  Lab 01/25/24 1149  WBC 7.5  HGB 12.6*  HCT 41.2  MCV 89.4  PLT 170   Cardiac Enzymes: No results for input(s): "CKTOTAL", "CKMB", "CKMBINDEX", "TROPONINIHS" in the last 168 hours.  BNP (last 3 results) No results for input(s): "PROBNP" in the last 8760 hours. CBG: No results for input(s): "GLUCAP" in the last 168 hours.  Radiological Exams on Admission:  CT ABDOMEN PELVIS WO CONTRAST Result Date: 01/25/2024 CLINICAL DATA:  Right lower quadrant abdominal pain. Concern for acute appendicitis. EXAM: CT ABDOMEN AND PELVIS WITHOUT CONTRAST TECHNIQUE: Multidetector CT imaging of the abdomen and pelvis was performed following the standard protocol without IV contrast. RADIATION DOSE REDUCTION: This exam was performed according to the departmental dose-optimization program which includes automated exposure control, adjustment of the mA and/or kV according to patient size and/or use of iterative reconstruction technique. COMPARISON:  CT abdomen pelvis dated 07/03/2022. FINDINGS: Evaluation of  this exam is limited in the absence of intravenous contrast. Lower chest: The visualized lung bases are clear. There is coronary vascular calcification. No intra-abdominal free air or free fluid. Hepatobiliary: Subcentimeter hypodense lesions in the liver are too small to characterize but stable since the prior CT, likely cysts or hemangioma. No biliary ductal dilatation. The gallbladder is unremarkable. Pancreas: Unremarkable. No pancreatic ductal dilatation or surrounding inflammatory changes. Spleen: Normal in size without focal abnormality. Adrenals/Urinary Tract: The adrenal glands unremarkable. There is a 7 mm stone in the proximal right ureter at the ureteropelvic junction with mild right hydronephrosis. Several additional nonobstructing right renal calculi measure up to 6 mm in the interpolar kidney and 7 mm in the lower pole. Multiple nonobstructing left renal calculi measure up to 10 mm in the inferior pole. There is no hydronephrosis on the left. Left renal cysts suboptimally evaluated on this noncontrast CT but present on the prior CT. Mildly trabeculated bladder wall suggestive of chronic bladder outlet obstruction. Stomach/Bowel: There is sigmoid diverticulosis. Moderate amount of stool throughout the colon. There is no bowel obstruction or active inflammation. The appendix is normal. Vascular/Lymphatic: Advanced aortoiliac atherosclerotic disease. There is a 4 cm infrarenal abdominal aortic aneurysm. The IVC is unremarkable. No portal venous gas. There is no adenopathy.  Reproductive: Enlarged prostate gland measuring 5.8 cm in transverse axial diameter. The seminal vesicles are symmetric. Other: Small fat containing umbilical and left inguinal hernias. Musculoskeletal: Osteopenia with degenerative changes. No acute osseous pathology. IMPRESSION: 1. A 7 mm right UPJ stone with mild right hydronephrosis. 2. Additional nonobstructing bilateral renal calculi. 3. Sigmoid diverticulosis. No bowel  obstruction. Normal appendix. 4. Enlarged prostate gland. 5. A 4 cm infrarenal abdominal aortic aneurysm. Recommend follow-up CT or MR as appropriate in 12 months and referral to or continued care with vascular specialist. (Ref.: J Vasc Surg. 2018; 67:2-77 and J Am Coll Radiol 2013;10(10):789-794.) Electronically Signed   By: Elgie Collard M.D.   On: 01/25/2024 18:49     No intake/output data recorded. No intake/output data recorded.      Assessment and Plan: Hydronephrosis Patient presented with right-sided ureteric colic noted to have acute kidney injury as well as right sided have hydronephrosis due to subcentimeter renal stone on CAT scan.  Patient currently has had resolution of symptoms.  At this time I will start the patient on tamsulosin, patient is s/p 1 L normal saline in the ER, continue with 125 cc/h tonight.  I will repeat an ultrasound in the morning to see if hydronephrosis has resolved. Urology engaged by ER provider note pending. No concern for infection at this time. Microscopic hematuria. Tamsulosin ordered  AAA (abdominal aortic aneurysm) (HCC) Previously known.  Currently at 4 cm.  Follow-up ultrasound routinly   Med rec pending pharmcy input.    Advance Care Planning:   Code Status: Prior fulll code.  Consults: urology engaged.   Severity of Illness: The appropriate patient status for this patient is OBSERVATION. Observation status is judged to be reasonable and necessary in order to provide the required intensity of service to ensure the patient's safety. The patient's presenting symptoms, physical exam findings, and initial radiographic and laboratory data in the context of their medical condition is felt to place them at decreased risk for further clinical deterioration. Furthermore, it is anticipated that the patient will be medically stable for discharge from the hospital within 2 midnights of admission.   Author: Nolberto Hanlon, MD 01/25/2024 9:28 PM  For on  call review [email or web address].

## 2024-01-25 NOTE — ED Provider Notes (Signed)
 [place] EMERGENCY DEPARTMENT AT [facility] Provider Note   CSN: [id number] Arrival date & time: 01/25/24  1114     History  Chief Complaint  Patient presents with   Abdominal Pain    ZIGGY CHANTHAVONG is a 88 y.o. male.  88 year old male presents with 4 days of right-sided flank pain.  Pain radiates to his right groin.  Was seen in urgent care and concern for poss appendicitis and sent here.  Patient denies any fever or chills.  No dysuria.  No hematuria.  Does have a prior history of kidney stone in the past.       Home Medications Prior to Admission medications   Medication Sig Start Date End Date Taking? Authorizing Provider  aspirin 81 MG EC tablet Take 1 tablet (81 mg total) by mouth daily. 06/19/15   Lyn Records, MD  donepezil (ARICEPT) 10 MG tablet Take 1 tablet (10 mg total) by mouth daily. 12/25/23   Marcos Eke, PA-C  FLUoxetine (PROZAC) 20 MG capsule TAKE 1 CAPSULE(20 MG) BY MOUTH DAILY 12/01/23   [name], MD  memantine [facility]) 10 MG tablet Take 1 tablet  twice a day 09/09/23   Marcos Eke, PA-C  methimazole (TAPAZOLE) 5 MG tablet Take 0.5 tablets (2.5 mg total) by mouth daily. 09/04/23   Carlus Pavlov, MD  Multiple Vitamins-Minerals (CENTRUM SILVER PO) Take 1 tablet by mouth daily.    [provider]  rosuvastatin (CRESTOR) 40 MG tablet Take 1 tablet (40 mg total) by mouth daily. Dose increase 01/13/22 due to heart disease and LDL goal under 70 04/07/23   [name], MD  tamsulosin (FLOMAX) 0.4 MG CAPS capsule Take 0.4 mg by mouth. Prescribed by urology    [provider]  valsartan (DIOVAN) 40 MG tablet Take 1 tablet (40 mg total) by mouth daily. 01/15/24   [name], MD      Allergies    Lipitor [atorvastatin], Trazodone and nefazodone, Ciprofloxacin, Mycophenolate mofetil, Amoxicillin, Penicillins, and Rosuvastatin    Review of Systems   Review of Systems  All other systems reviewed and are  negative.   Physical Exam Updated Vital Signs BP 118/71   Pulse 75   Temp 98.1 F (36.7 C) (Oral)   Resp 16   SpO2 100%  Physical Exam Vitals and nursing note reviewed.  Constitutional:      General: He is not in acute distress.    Appearance: Normal appearance. He is well-developed. He is not toxic-appearing.  HENT:     Head: Normocephalic and atraumatic.  Eyes:     General: Lids are normal.     Conjunctiva/sclera: Conjunctivae normal.     Pupils: Pupils are equal, round, and reactive to light.  Neck:     Thyroid: No thyroid mass.     Trachea: No tracheal deviation.  Cardiovascular:     Rate and Rhythm: Normal rate and regular rhythm.     Heart sounds: Normal heart sounds. No murmur heard.    No gallop.  Pulmonary:     Effort: Pulmonary effort is normal. No respiratory distress.     Breath sounds: Normal breath sounds. No stridor. No decreased breath sounds, wheezing, rhonchi or rales.  Abdominal:     General: There is no distension.     Palpations: Abdomen is soft.     Tenderness: There is no abdominal tenderness. There is no rebound.    Musculoskeletal:        General:  No tenderness. Normal range of motion.     Cervical back: Normal range of motion and neck supple.  Skin:    General: Skin is warm and dry.     Findings: No abrasion or rash.  Neurological:     Mental Status: He is alert and oriented to person, place, and time. Mental status is at baseline.     GCS: GCS eye subscore is 4. GCS verbal subscore is 5. GCS motor subscore is 6.     Cranial Nerves: No cranial nerve deficit.     Sensory: No sensory deficit.     Motor: Motor function is intact.  Psychiatric:        Attention and Perception: Attention normal.        Speech: Speech normal.        Behavior: Behavior normal.     ED Results / Procedures / Treatments   Labs (all labs ordered are listed, but only abnormal results are displayed) Labs Reviewed  COMPREHENSIVE METABOLIC PANEL - Abnormal;  Notable for the following components:      Result Value   Glucose, Bld 107 (*)    BUN 25 (*)    Creatinine, Ser 1.85 (*)    GFR, Estimated 35 (*)    All other components within normal limits  CBC - Abnormal; Notable for the following components:   Hemoglobin 12.6 (*)    RDW 15.7 (*)    All other components within normal limits  URINALYSIS, ROUTINE W REFLEX MICROSCOPIC - Abnormal; Notable for the following components:   Hgb urine dipstick SMALL (*)    Protein, ur 100 (*)    All other components within normal limits  LIPASE, BLOOD    EKG None  Radiology CT ABDOMEN PELVIS WO CONTRAST Result Date: 01/25/2024 CLINICAL DATA:  Right lower quadrant abdominal pain. Concern for acute appendicitis. EXAM: CT ABDOMEN AND PELVIS WITHOUT CONTRAST TECHNIQUE: Multidetector CT imaging of the abdomen and pelvis was performed following the standard protocol without IV contrast. RADIATION DOSE REDUCTION: This exam was performed according to the departmental dose-optimization program which includes automated exposure control, adjustment of the mA and/or kV according to patient size and/or use of iterative reconstruction technique. COMPARISON:  CT abdomen pelvis dated 07/03/2022. FINDINGS: Evaluation of this exam is limited in the absence of intravenous contrast. Lower chest: The visualized lung bases are clear. There is coronary vascular calcification. No intra-abdominal free air or free fluid. Hepatobiliary: Subcentimeter hypodense lesions in the liver are too small to characterize but stable since the prior CT, likely cysts or hemangioma. No biliary ductal dilatation. The gallbladder is unremarkable. Pancreas: Unremarkable. No pancreatic ductal dilatation or surrounding inflammatory changes. Spleen: Normal in size without focal abnormality. Adrenals/Urinary Tract: The adrenal glands unremarkable. There is a 7 mm stone in the proximal right ureter at the ureteropelvic junction with mild right hydronephrosis.  Several additional nonobstructing right renal calculi measure up to 6 mm in the interpolar kidney and 7 mm in the lower pole. Multiple nonobstructing left renal calculi measure up to 10 mm in the inferior pole. There is no hydronephrosis on the left. Left renal cysts suboptimally evaluated on this noncontrast CT but present on the prior CT. Mildly trabeculated bladder wall suggestive of chronic bladder outlet obstruction. Stomach/Bowel: There is sigmoid diverticulosis. Moderate amount of stool throughout the colon. There is no bowel obstruction or active inflammation. The appendix is normal. Vascular/Lymphatic: Advanced aortoiliac atherosclerotic disease. There is a 4 cm infrarenal abdominal aortic aneurysm. The IVC is unremarkable.  No portal venous gas. There is no adenopathy. Reproductive: Enlarged prostate gland measuring 5.8 cm in transverse axial diameter. The seminal vesicles are symmetric. Other: Small fat containing umbilical and left inguinal hernias. Musculoskeletal: Osteopenia with degenerative changes. No acute osseous pathology. IMPRESSION: 1. A 7 mm right UPJ stone with mild right hydronephrosis. 2. Additional nonobstructing bilateral renal calculi. 3. Sigmoid diverticulosis. No bowel obstruction. Normal appendix. 4. Enlarged prostate gland. 5. A 4 cm infrarenal abdominal aortic aneurysm. Recommend follow-up CT or MR as appropriate in 12 months and referral to or continued care with vascular specialist. (Ref.: J Vasc Surg. 2018; 67:2-77 and J Am Coll Radiol 2013;10(10):789-794.) Electronically Signed   By: Elgie Collard M.D.   On: 01/25/2024 18:49    Procedures Procedures    Medications Ordered in ED Medications  0.9 %  sodium chloride infusion (has no administration in time range)  sodium chloride 0.9 % bolus 1,000 mL (has no administration in time range)    ED Course/ Medical Decision Making/ A&P                                 Medical Decision Making Amount and/or Complexity of  Data Reviewed Labs: ordered.  Risk Prescription drug management.   Abdominal CT shows no evidence of appendicitis.  CT does show however a 7 mm kidney stone.  Mild hydronephrosis is noted.  Does have evidence of AKI.  Discussed with urologist on-call, Dr. Lafonda Mosses, recommends admission with IV fluids and serial creatinines.  He will see the patient in the morning.  Will consult hospitalist team        Final Clinical Impression(s) / ED Diagnoses Final diagnoses:  None    Rx / DC Orders ED Discharge Orders     None         Lorre Nick, MD 01/25/24 1947

## 2024-01-25 NOTE — ED Triage Notes (Signed)
 Pt c/o RLQ x 4 days-denies v/d-NAD-steady gait

## 2024-01-25 NOTE — Telephone Encounter (Signed)
 Copied from CRM [id number]. Topic: Clinical - Red Word Triage >> Jan 25, 2024  7:55 AM [place] A wrote: [facility] that prompted transfer to Nurse Triage: patient has stomach pain and vomiting since last Friday   Chief Complaint: Abdominal Pain, RLQ  Symptoms: Nausea  Frequency: Acute, Since Friday.  Pertinent Negatives: Patient denies diarrhea, constipation, or a fever.    Disposition: [x] ED /[] Urgent Gallagher (no appt availability in office) / [] Appointment(In office/virtual)/ []  Timothy Gallagher/ [] Home Gallagher/ [] Refused Recommended Disposition /[] [facility]/ []  Follow-up with PCP  Additional Notes: Spoke with Wife for triage. LC is being triaged for RLQ abdominal pain that began Friday.  The pain is reported as a 9 on a numeric pain scale that comes and goes intermittently. The patient has taken a Tylenol with approximately 36 minutes of time gone by with no relief at this time. No relieving or aggravating factors reported. Per protocol recommended the patient seek emergency Gallagher. Verbalized understanding, agreed to disposition.    Reason for Disposition  [1] SEVERE pain AND [2] age > 60 years  Answer Assessment - Initial Assessment Questions 1. LOCATION: "Where does it hurt?"      Lower Abdominal Pain  2. RADIATION: "Does the pain shoot anywhere else?" (e.g., chest, back)     Back (Reported once on Friday)  3. ONSET: "When did the pain begin?" (Minutes, hours or days ago)      Since Friday  4. SUDDEN: "Gradual or sudden onset?"     Gradual  5. PATTERN "Does the pain come and go, or is it constant?"    - If it comes and goes: "How long does it last?" "Do you have pain now?"     (Note: Comes and goes means the pain is intermittent. It goes away completely between bouts.)    - If constant: "Is it getting better, staying the same, or getting worse?"      (Note: Constant means the pain never goes away completely; most serious pain is constant and gets worse.)       Comes and goes  6. SEVERITY: "How bad is the pain?"  (e.g., Scale 1-10; mild, moderate, or severe)    - MILD (1-3): Doesn't interfere with normal activities, abdomen soft and not tender to touch.     - MODERATE (4-7): Interferes with normal activities or awakens from sleep, abdomen tender to touch.     - SEVERE (8-10): Excruciating pain, doubled over, unable to do any normal activities.       9  7. RECURRENT SYMPTOM: "Have you ever had this type of stomach pain before?" If Yes, ask: "When was the last time?" and "What happened that time?"      No  8. CAUSE: "What do you think is causing the stomach pain?"     Unsure  9. RELIEVING/AGGRAVATING FACTORS: "What makes it better or worse?" (e.g., antacids, bending or twisting motion, bowel movement)     Neither on either end.  10. OTHER SYMPTOMS: "Do you have any other symptoms?" (e.g., back pain, diarrhea, fever, urination pain, vomiting)       Nausea  Protocols used: Abdominal Pain - Male-A-AH

## 2024-01-25 NOTE — ED Notes (Signed)
 Patient is being discharged from the Urgent Care and sent to the Emergency Department via POV . Per Urban Gibson, PA-C, patient is in need of higher level of care due to acute abd/RLQ pain. Patient is aware and verbalizes understanding of plan of care.  Vitals:   01/25/24 1044  BP: (!) 168/87  Pulse: 74  Resp: 20  Temp: 98 F (36.7 C)  SpO2: 97%

## 2024-01-26 ENCOUNTER — Other Ambulatory Visit: Payer: Self-pay

## 2024-01-26 ENCOUNTER — Observation Stay (HOSPITAL_COMMUNITY)

## 2024-01-26 ENCOUNTER — Other Ambulatory Visit: Payer: Self-pay | Admitting: Urology

## 2024-01-26 DIAGNOSIS — N132 Hydronephrosis with renal and ureteral calculous obstruction: Secondary | ICD-10-CM | POA: Diagnosis not present

## 2024-01-26 DIAGNOSIS — Z87442 Personal history of urinary calculi: Secondary | ICD-10-CM | POA: Diagnosis not present

## 2024-01-26 DIAGNOSIS — N202 Calculus of kidney with calculus of ureter: Secondary | ICD-10-CM | POA: Diagnosis not present

## 2024-01-26 DIAGNOSIS — N23 Unspecified renal colic: Secondary | ICD-10-CM | POA: Diagnosis not present

## 2024-01-26 LAB — CBC
HCT: 35.7 % — ABNORMAL LOW (ref 39.0–52.0)
Hemoglobin: 11.2 g/dL — ABNORMAL LOW (ref 13.0–17.0)
MCH: 27.6 pg (ref 26.0–34.0)
MCHC: 31.4 g/dL (ref 30.0–36.0)
MCV: 87.9 fL (ref 80.0–100.0)
Platelets: 146 10*3/uL — ABNORMAL LOW (ref 150–400)
RBC: 4.06 MIL/uL — ABNORMAL LOW (ref 4.22–5.81)
RDW: 15.6 % — ABNORMAL HIGH (ref 11.5–15.5)
WBC: 6.8 10*3/uL (ref 4.0–10.5)
nRBC: 0 % (ref 0.0–0.2)

## 2024-01-26 LAB — BASIC METABOLIC PANEL
Anion gap: 6 (ref 5–15)
BUN: 22 mg/dL (ref 8–23)
CO2: 21 mmol/L — ABNORMAL LOW (ref 22–32)
Calcium: 8.2 mg/dL — ABNORMAL LOW (ref 8.9–10.3)
Chloride: 110 mmol/L (ref 98–111)
Creatinine, Ser: 1.61 mg/dL — ABNORMAL HIGH (ref 0.61–1.24)
GFR, Estimated: 42 mL/min — ABNORMAL LOW (ref >60)
Glucose, Bld: 101 mg/dL — ABNORMAL HIGH (ref 70–99)
Potassium: 4.3 mmol/L (ref 3.5–5.1)
Sodium: 137 mmol/L (ref 135–145)

## 2024-01-26 LAB — APTT: aPTT: 30 s (ref 24–36)

## 2024-01-26 LAB — PROTIME-INR
INR: 1.1 (ref 0.8–1.2)
Prothrombin Time: 14.5 s (ref 11.4–15.2)

## 2024-01-26 MED ORDER — ONDANSETRON HCL 4 MG/2ML IJ SOLN
4.0000 mg | Freq: Four times a day (QID) | INTRAMUSCULAR | Status: DC | PRN
  Administered 2024-01-26: 4 mg via INTRAVENOUS
  Filled 2024-01-26: qty 2

## 2024-01-26 MED ORDER — IRBESARTAN 75 MG PO TABS
37.5000 mg | ORAL_TABLET | Freq: Every day | ORAL | Status: DC
  Administered 2024-01-26: 37.5 mg via ORAL
  Filled 2024-01-26: qty 1

## 2024-01-26 MED ORDER — METHIMAZOLE 2.5 MG HALF TABLET
2.5000 mg | ORAL_TABLET | Freq: Every day | ORAL | Status: DC
  Administered 2024-01-26 – 2024-01-27 (×2): 2.5 mg via ORAL
  Filled 2024-01-26 (×2): qty 1

## 2024-01-26 MED ORDER — FLUOXETINE HCL 20 MG PO CAPS
20.0000 mg | ORAL_CAPSULE | Freq: Every day | ORAL | Status: DC
  Administered 2024-01-26 – 2024-01-27 (×2): 20 mg via ORAL
  Filled 2024-01-26 (×2): qty 1

## 2024-01-26 MED ORDER — MEMANTINE HCL 10 MG PO TABS
10.0000 mg | ORAL_TABLET | Freq: Two times a day (BID) | ORAL | Status: DC
  Administered 2024-01-26 – 2024-01-27 (×3): 10 mg via ORAL
  Filled 2024-01-26 (×3): qty 1

## 2024-01-26 MED ORDER — ONDANSETRON HCL 4 MG/2ML IJ SOLN
4.0000 mg | Freq: Once | INTRAMUSCULAR | Status: AC | PRN
  Administered 2024-01-26: 4 mg via INTRAVENOUS
  Filled 2024-01-26: qty 2

## 2024-01-26 MED ORDER — ROSUVASTATIN CALCIUM 20 MG PO TABS
40.0000 mg | ORAL_TABLET | Freq: Every day | ORAL | Status: DC
  Administered 2024-01-26 – 2024-01-27 (×2): 40 mg via ORAL
  Filled 2024-01-26 (×2): qty 2

## 2024-01-26 MED ORDER — OXYCODONE HCL 5 MG PO TABS
5.0000 mg | ORAL_TABLET | Freq: Four times a day (QID) | ORAL | Status: DC | PRN
  Administered 2024-01-26 (×2): 5 mg via ORAL
  Filled 2024-01-26 (×2): qty 1

## 2024-01-26 MED ORDER — DONEPEZIL HCL 10 MG PO TABS
10.0000 mg | ORAL_TABLET | Freq: Every day | ORAL | Status: DC
  Administered 2024-01-26 – 2024-01-27 (×2): 10 mg via ORAL
  Filled 2024-01-26 (×2): qty 1

## 2024-01-26 NOTE — ED Notes (Signed)
 ED TO INPATIENT HANDOFF REPORT  Name/Age/Gender Timothy Gallagher 88 y.o. male  Code Status    Code Status Orders  (From admission, onward)           Start     Ordered   01/25/24 2139  Full code  Continuous       Question:  By:  Answer:  Other   01/25/24 2138           Code Status History     Date Active Date Inactive Code Status Order ID Comments User Context   12/31/2019 0810 01/01/2020 1759 Full Code [id number]  Jae Dire, MD ED   07/31/2017 1759 08/02/2017 1348 Full Code [id number]  Leatha Gilding, MD Inpatient   05/19/2014 1414 05/24/2014 1345 Full Code [id number]  Purcell Nails, MD Inpatient   05/12/2014 1017 05/12/2014 1604 Full Code [id number]  Lyn Records, MD Inpatient      Advance Directive Documentation    Flowsheet Row Most Recent Value  Type of Advance Directive Healthcare Power of Attorney, Living will  Pre-existing out of facility DNR order (yellow form or pink MOST form) --  "MOST" Form in Place? --       Home/SNF/Other Home  Chief Complaint Ureteral colic [N23]  Level of Care/Admitting Diagnosis ED Disposition     ED Disposition  Admit   Condition  --   Comment  Hospital Area: [facility] [[id number]]  Level of Care: Med-Surg [16]  May place patient in observation at [facility] or [facility] if equivalent level of care is available:: No  Covid Evaluation: Asymptomatic - no recent exposure (last 10 days) testing not required  Diagnosis: Ureteral colic [629528]  Admitting Physician: Nolberto Hanlon [[id number]]  Attending Physician: Nolberto Hanlon [[id number]]          Medical History Past Medical History:  Diagnosis Date   ACNE ROSACEA 06/27/2009   ACTINIC KERATOSIS, HEAD 04/18/2009   Acute maxillary sinusitis 05/14/2010   ALLERGIC RHINITIS 04/09/2007   Anal fissure 04/07/2016   B12 DEFICIENCY 06/07/2007   BACK PAIN WITH RADICULOPATHY 04/24/2008   Bilateral nephrolithiasis 07/07/2022   CT abd 06/2022 1. Nonobstructive  bilateral nephrolithiasis measuring up to 5 mm on the right and 6 mm on the left.   Cancer [facility])    skin   CHEST WALL PAIN, ACUTE 06/15/2009   Chronic maxillary sinusitis 05/29/2008   COLITIS 04/27/2009   COLONIC POLYPS, HX OF 04/27/2009   tubular adenomas   Coronary artery disease 05/12/2014   Cath 05/12/2014 w/ severe 3-vessel CAD and preserved LV function, EF 55%   DERMATITIS, ATOPIC 10/12/2007   Diverticular disease 07/07/2022   CT abd 06/2022 . Colonic diverticulosis with no acute diverticulitis.   DIVERTICULOSIS, COLON 04/27/2009   ECCHYMOSES, SPONTANEOUS 06/27/2008   Elevated sedimentation rate 05/02/2009   ESOPHAGEAL STRICTURE 04/27/2009   GASTRITIS, CHRONIC 04/27/2009   HYPERLIPIDEMIA 03/13/2008   HYPERTENSION 04/09/2007   Hyperthyroidism    Iliac aneurysm (HCC) 07/07/2022   CT abd 06/2022 . Aneurysmal left common iliac artery (1.6 cm).     Internal bleeding hemorrhoids 01/22/2015   01/22/2015 Seen at anoscopy, grade 1 all 3 positions    Irritable bowel syndrome 08/11/2007   KIDNEY DISEASE 04/09/2007   NECK PAIN 09/14/2008   NEUROPATHY, IDIOPATHIC PERIPHERAL NEC 08/11/2007   OSTEOARTHRITIS, WRIST, RIGHT 08/05/2010   Postoperative delirium 05/20/2014   S/P CABG x 4 05/19/2014   LIMA to LAD, SVG to diag, SVG to OM, SVG to PDA,  EVH via right thigh and leg    Allergies Allergies  Allergen Reactions   Lipitor [Atorvastatin] Other (See Comments)    REACTION: nausea and blurred vision   Trazodone And Nefazodone Other (See Comments)    dizzy   Ciprofloxacin Swelling   Mycophenolate Mofetil Other (See Comments)    REACTION: unspecified   Amoxicillin Rash    Has patient had a PCN reaction causing immediate rash, facial/tongue/throat swelling, SOB or lightheadedness with hypotension: Unknown Has patient had a PCN reaction causing severe rash involving mucus membranes or skin necrosis: Unknown Has patient had a PCN reaction that required hospitalization: Unknown Has patient had  a PCN reaction occurring within the last 10 years: Unknown If all of the above answers are "NO", then may proceed with Cephalosporin use.    Penicillins Rash    Has patient had a PCN reaction causing immediate rash, facial/tongue/throat swelling, SOB or lightheadedness with hypotension: Unknown Has patient had a PCN reaction causing severe rash involving mucus membranes or skin necrosis: Unknown Has patient had a PCN reaction that required hospitalization: Unknown Has patient had a PCN reaction occurring within the last 10 years: Unknown If all of the above answers are "NO", then may proceed with Cephalosporin use.    Rosuvastatin Other (See Comments)    Unknown     IV Location/Drains/Wounds Patient Lines/Drains/Airways Status     Active Line/Drains/Airways     Name Placement date Placement time Site Days   Peripheral IV 01/25/24 20 G 1" Right Antecubital 01/25/24  1954  Antecubital  1            Labs/Imaging Results for orders placed or performed during the hospital encounter of 01/25/24 (from the past 48 hours)  Lipase, blood     Status: None   Collection Time: 01/25/24 11:49 AM  Result Value Ref Range   Lipase 32 11 - 51 U/L    Comment: Performed at [facility], [address], [place], [place] [address]  Comprehensive metabolic panel     Status: Abnormal   Collection Time: 01/25/24 11:49 AM  Result Value Ref Range   Sodium 140 135 - 145 mmol/L   Potassium 4.1 3.5 - 5.1 mmol/L   Chloride 106 98 - 111 mmol/L   CO2 24 22 - 32 mmol/L   Glucose, Bld 107 (H) 70 - 99 mg/dL    Comment: Glucose reference range applies only to samples taken after fasting for at least 8 hours.   BUN 25 (H) 8 - 23 mg/dL   Creatinine, Ser 6.04 (H) 0.61 - 1.24 mg/dL   Calcium 8.9 8.9 - 54.0 mg/dL   Total Protein 7.3 6.5 - 8.1 g/dL   Albumin 3.5 3.5 - 5.0 g/dL   AST 37 15 - 41 U/L   ALT 22 0 - 44 U/L   Alkaline Phosphatase 65 38 - 126 U/L   Total Bilirubin 0.9 0.0 - 1.2  mg/dL   GFR, Estimated 35 (L) >60 mL/min    Comment: (NOTE) Calculated using the CKD-EPI Creatinine Equation (2021)    Anion gap 10 5 - 15    Comment: Performed at [facility], [address], [place], [place] [address]  CBC     Status: Abnormal   Collection Time: 01/25/24 11:49 AM  Result Value Ref Range   WBC 7.5 4.0 - 10.5 K/uL   RBC 4.61 4.22 - 5.81 MIL/uL   Hemoglobin 12.6 (L) 13.0 - 17.0 g/dL   HCT 14.7 82.9 -  52.0 %   MCV 89.4 80.0 - 100.0 fL   MCH 27.3 26.0 - 34.0 pg   MCHC 30.6 30.0 - 36.0 g/dL   RDW 30.8 (H) 65.7 - 84.6 %   Platelets 170 150 - 400 K/uL   nRBC 0.0 0.0 - 0.2 %    Comment: Performed at [facility], [address], [place], [place] [address]  Urinalysis, Routine w reflex microscopic -Urine, Clean Catch     Status: Abnormal   Collection Time: 01/25/24 11:49 AM  Result Value Ref Range   Color, Urine YELLOW YELLOW   APPearance CLEAR CLEAR   Specific Gravity, Urine 1.019 1.005 - 1.030   pH 5.0 5.0 - 8.0   Glucose, UA NEGATIVE NEGATIVE mg/dL   Hgb urine dipstick SMALL (A) NEGATIVE   Bilirubin Urine NEGATIVE NEGATIVE   Ketones, ur NEGATIVE NEGATIVE mg/dL   Protein, ur 284 (A) NEGATIVE mg/dL   Nitrite NEGATIVE NEGATIVE   Leukocytes,Ua NEGATIVE NEGATIVE   RBC / HPF 21-50 0 - 5 RBC/hpf   WBC, UA 0-5 0 - 5 WBC/hpf   Bacteria, UA NONE SEEN NONE SEEN   Squamous Epithelial / HPF 0-5 0 - 5 /HPF   Mucus PRESENT     Comment: Performed at [facility], [address] [place], [place] [address]   *Note: Due to a large number of results and/or encounters for the requested time period, some results have not been displayed. A complete set of results can be found in Results Review.   CT ABDOMEN PELVIS WO CONTRAST Result Date: 01/25/2024 CLINICAL DATA:  Right lower quadrant abdominal pain. Concern for acute appendicitis. EXAM: CT ABDOMEN AND PELVIS WITHOUT CONTRAST TECHNIQUE: Multidetector CT imaging of the  abdomen and pelvis was performed following the standard protocol without IV contrast. RADIATION DOSE REDUCTION: This exam was performed according to the departmental dose-optimization program which includes automated exposure control, adjustment of the mA and/or kV according to patient size and/or use of iterative reconstruction technique. COMPARISON:  CT abdomen pelvis dated 07/03/2022. FINDINGS: Evaluation of this exam is limited in the absence of intravenous contrast. Lower chest: The visualized lung bases are clear. There is coronary vascular calcification. No intra-abdominal free air or free fluid. Hepatobiliary: Subcentimeter hypodense lesions in the liver are too small to characterize but stable since the prior CT, likely cysts or hemangioma. No biliary ductal dilatation. The gallbladder is unremarkable. Pancreas: Unremarkable. No pancreatic ductal dilatation or surrounding inflammatory changes. Spleen: Normal in size without focal abnormality. Adrenals/Urinary Tract: The adrenal glands unremarkable. There is a 7 mm stone in the proximal right ureter at the ureteropelvic junction with mild right hydronephrosis. Several additional nonobstructing right renal calculi measure up to 6 mm in the interpolar kidney and 7 mm in the lower pole. Multiple nonobstructing left renal calculi measure up to 10 mm in the inferior pole. There is no hydronephrosis on the left. Left renal cysts suboptimally evaluated on this noncontrast CT but present on the prior CT. Mildly trabeculated bladder wall suggestive of chronic bladder outlet obstruction. Stomach/Bowel: There is sigmoid diverticulosis. Moderate amount of stool throughout the colon. There is no bowel obstruction or active inflammation. The appendix is normal. Vascular/Lymphatic: Advanced aortoiliac atherosclerotic disease. There is a 4 cm infrarenal abdominal aortic aneurysm. The IVC is unremarkable. No portal venous gas. There is no adenopathy. Reproductive: Enlarged  prostate gland measuring 5.8 cm in transverse axial diameter. The seminal vesicles are symmetric. Other: Small fat containing umbilical and left inguinal hernias.  Musculoskeletal: Osteopenia with degenerative changes. No acute osseous pathology. IMPRESSION: 1. A 7 mm right UPJ stone with mild right hydronephrosis. 2. Additional nonobstructing bilateral renal calculi. 3. Sigmoid diverticulosis. No bowel obstruction. Normal appendix. 4. Enlarged prostate gland. 5. A 4 cm infrarenal abdominal aortic aneurysm. Recommend follow-up CT or MR as appropriate in 12 months and referral to or continued care with vascular specialist. (Ref.: J Vasc Surg. 2018; 67:2-77 and J Am Coll Radiol 2013;10(10):789-794.) Electronically Signed   By: Elgie Collard M.D.   On: 01/25/2024 18:49    Pending Labs Unresulted Labs (From admission, onward)     Start     Ordered   02/01/24 0500  Creatinine, serum  (enoxaparin (LOVENOX)    CrCl >/= 30 ml/min)  Weekly,   R     Comments: while on enoxaparin therapy    01/25/24 2138   01/26/24 0500  APTT  Tomorrow morning,   R        01/25/24 2138   01/26/24 0500  Protime-INR  Tomorrow morning,   R        01/25/24 2138   01/26/24 0500  Basic metabolic panel  Tomorrow morning,   R        01/25/24 2138   01/26/24 0500  CBC  Tomorrow morning,   R        01/25/24 2138   01/25/24 2143  Calculi, with Photograph (to Clinical Lab)  Once,   R        01/25/24 2142            Vitals/Pain Today's Vitals   01/25/24 1131 01/25/24 1437 01/25/24 1809 01/25/24 2132  BP: (!) 138/95 137/81 118/71 137/73  Pulse: 75 74 75 79  Resp: 16 18 16 18   Temp: 98.7 F (37.1 C) 99 F (37.2 C) 98.1 F (36.7 C) 98.4 F (36.9 C)  TempSrc: Oral Oral Oral Oral  SpO2: 97% 97% 100% 97%    Isolation Precautions No active isolations  Medications Medications  0.9 %  sodium chloride infusion ( Intravenous New Bag/Given 01/25/24 2232)  tamsulosin (FLOMAX) capsule 0.4 mg (0.4 mg Oral Given 01/25/24  2227)  enoxaparin (LOVENOX) injection 40 mg (has no administration in time range)  acetaminophen (TYLENOL) tablet 650 mg (has no administration in time range)    Or  acetaminophen (TYLENOL) suppository 650 mg (has no administration in time range)  polyethylene glycol (MIRALAX / GLYCOLAX) packet 17 g (has no administration in time range)  sodium chloride flush (NS) 0.9 % injection 3 mL (3 mLs Intravenous Given 01/25/24 2232)  sodium chloride 0.9 % bolus 1,000 mL (1,000 mLs Intravenous New Bag/Given 01/25/24 1954)  QUEtiapine (SEROQUEL) tablet 25 mg (25 mg Oral Given 01/25/24 2227)    Mobility walks with person assist

## 2024-01-26 NOTE — Care Management Obs Status (Signed)
 MEDICARE OBSERVATION STATUS NOTIFICATION   Patient Details  Name: Timothy Gallagher MRN: [id number] Date of Birth: May 10, 1936   Medicare Observation Status Notification Given:  Yes    Howell Rucks, RN 01/26/2024, 10:59 AM

## 2024-01-26 NOTE — ED Notes (Signed)
 Called patient wife to inform her of patient getting a bed but no answer at this time.

## 2024-01-26 NOTE — Plan of Care (Signed)

## 2024-01-26 NOTE — ED Notes (Signed)
 Patient resting in bed.

## 2024-01-26 NOTE — Progress Notes (Signed)
 PROGRESS NOTE  Timothy Gallagher  FAO:[id number] DOB: November 20, 1935 DOA: 01/25/2024 PCP: [name], MD   Brief Narrative: Patient is a 88 year old male with history of cognitive deficits, hypertension, hyperlipidemia, hyperthyroidism who presented from home with complaint of right lower quadrant pain, nausea.  CT imaging showed 7 mm right UPJ stone with mild right hydronephrosis, nonobstructing bilateral renal calculi.  Urology consulted.  Flank pain much better now.  Urology planning for shockwave lithotripsy as an outpatient on Friday.  Planning to keep him here 1 more day for IV fluids for his AKI.   Assessment & Plan:  Principal Problem:   Ureteral colic Active Problems:   AAA (abdominal aortic aneurysm) (HCC)   Hydronephrosis  Nephrolithiasis: Presented with abdominal pain mainly in the right lower quadrant, nausea. CT imaging showed 7 mm right UPJ stone with mild right hydronephrosis, nonobstructing bilateral renal calculi.  Urology consulted.  Patient symptoms have significantly improved.  Started on Flomax.   No concern for infection.  Renal ultrasound today showed stable mild right-sided hydronephrosis.  Urology, Dr. Cheree Ditto planning for shockwave lithotripsy on Friday as an outpatient  Abdominal aortic aneurysm: Previously known.Currently at 4 cm.  Annual follow-up with abdominal ultrasound recommended  AKI: Baseline creatinine normal.  Presented with creatinine in the range of 1.8.  Continue gentle IV fluids today, check BMP tomorrow  History of dementia: On Aricept, memantine  History of hyperthyroidism: Takes methimazole at home  History of hyperlipidemia: On Crestor  History of hypertension: Currently blood pressure stable.  Home valsartan on hold         DVT prophylaxis:enoxaparin (LOVENOX) injection 40 mg Start: 01/26/24 2200     Code Status: Full Code  Family Communication: Wife at bedside on 3/11  Patient status:Inpatient  Patient is from  :Home  Anticipated discharge ON:GEXB  Estimated DC date: tomorrow  Consultants: Urology  Procedures:None  Antimicrobials:  Anti-infectives (From admission, onward)    None       Subjective: Patient seen and examined at bedside today.  Hemodynamically stable.  He was complaining of some right flank pain earlier this morning.  Currently pain is better with pain medication.  No nausea or vomiting.  We discussed about monitoring him 1 more day with IV fluid and possible discharge in morning  Objective: Vitals:   01/25/24 1809 01/25/24 2132 01/26/24 0130 01/26/24 0629  BP: 118/71 137/73 (!) 154/89 124/63  Pulse: 75 79 92 82  Resp: 16 18 17 15   Temp: 98.1 F (36.7 C) 98.4 F (36.9 C) 98.4 F (36.9 C) 98.7 F (37.1 C)  TempSrc: Oral Oral Oral Oral  SpO2: 100% 97% 97% 94%    Intake/Output Summary (Last 24 hours) at 01/26/2024 0741 Last data filed at 01/26/2024 0537 Gross per 24 hour  Intake 945.41 ml  Output 500 ml  Net 445.41 ml   There were no vitals filed for this visit.  Examination:  General exam: Overall comfortable, not in distress HEENT: PERRL Respiratory system:  no wheezes or crackles  Cardiovascular system: S1 & S2 heard, RRR.  Gastrointestinal system: Abdomen is nondistended, soft and nontender. Central nervous system: Alert and oriented Extremities: No edema, no clubbing ,no cyanosis Skin: No rashes, no ulcers,no icterus     Data Reviewed: I have personally reviewed following labs and imaging studies  CBC: Recent Labs  Lab 01/25/24 1149 01/26/24 0444  WBC 7.5 6.8  HGB 12.6* 11.2*  HCT 41.2 35.7*  MCV 89.4 87.9  PLT 170 146*   Basic Metabolic  Panel: Recent Labs  Lab 01/25/24 1149 01/26/24 0444  NA 140 137  K 4.1 4.3  CL 106 110  CO2 24 21*  GLUCOSE 107* 101*  BUN 25* 22  CREATININE 1.85* 1.61*  CALCIUM 8.9 8.2*     No results found for this or any previous visit (from the past 240 hours).   Radiology Studies: CT ABDOMEN PELVIS  WO CONTRAST Result Date: 01/25/2024 CLINICAL DATA:  Right lower quadrant abdominal pain. Concern for acute appendicitis. EXAM: CT ABDOMEN AND PELVIS WITHOUT CONTRAST TECHNIQUE: Multidetector CT imaging of the abdomen and pelvis was performed following the standard protocol without IV contrast. RADIATION DOSE REDUCTION: This exam was performed according to the departmental dose-optimization program which includes automated exposure control, adjustment of the mA and/or kV according to patient size and/or use of iterative reconstruction technique. COMPARISON:  CT abdomen pelvis dated 07/03/2022. FINDINGS: Evaluation of this exam is limited in the absence of intravenous contrast. Lower chest: The visualized lung bases are clear. There is coronary vascular calcification. No intra-abdominal free air or free fluid. Hepatobiliary: Subcentimeter hypodense lesions in the liver are too small to characterize but stable since the prior CT, likely cysts or hemangioma. No biliary ductal dilatation. The gallbladder is unremarkable. Pancreas: Unremarkable. No pancreatic ductal dilatation or surrounding inflammatory changes. Spleen: Normal in size without focal abnormality. Adrenals/Urinary Tract: The adrenal glands unremarkable. There is a 7 mm stone in the proximal right ureter at the ureteropelvic junction with mild right hydronephrosis. Several additional nonobstructing right renal calculi measure up to 6 mm in the interpolar kidney and 7 mm in the lower pole. Multiple nonobstructing left renal calculi measure up to 10 mm in the inferior pole. There is no hydronephrosis on the left. Left renal cysts suboptimally evaluated on this noncontrast CT but present on the prior CT. Mildly trabeculated bladder wall suggestive of chronic bladder outlet obstruction. Stomach/Bowel: There is sigmoid diverticulosis. Moderate amount of stool throughout the colon. There is no bowel obstruction or active inflammation. The appendix is normal.  Vascular/Lymphatic: Advanced aortoiliac atherosclerotic disease. There is a 4 cm infrarenal abdominal aortic aneurysm. The IVC is unremarkable. No portal venous gas. There is no adenopathy. Reproductive: Enlarged prostate gland measuring 5.8 cm in transverse axial diameter. The seminal vesicles are symmetric. Other: Small fat containing umbilical and left inguinal hernias. Musculoskeletal: Osteopenia with degenerative changes. No acute osseous pathology. IMPRESSION: 1. A 7 mm right UPJ stone with mild right hydronephrosis. 2. Additional nonobstructing bilateral renal calculi. 3. Sigmoid diverticulosis. No bowel obstruction. Normal appendix. 4. Enlarged prostate gland. 5. A 4 cm infrarenal abdominal aortic aneurysm. Recommend follow-up CT or MR as appropriate in 12 months and referral to or continued care with vascular specialist. (Ref.: J Vasc Surg. 2018; 67:2-77 and J Am Coll Radiol 2013;10(10):789-794.) Electronically Signed   By: Elgie Collard M.D.   On: 01/25/2024 18:49    Scheduled Meds:  enoxaparin (LOVENOX) injection  40 mg Subcutaneous QHS   sodium chloride flush  3 mL Intravenous Q12H   tamsulosin  0.4 mg Oral BID   Continuous Infusions:  sodium chloride 125 mL/hr at 01/25/24 2232     LOS: 0 days   Burnadette Pop, MD [facility] P3/09/2024, 7:41 AM

## 2024-01-26 NOTE — Plan of Care (Signed)
   Problem: Education: Goal: Knowledge of General Education information will improve Description Including pain rating scale, medication(s)/side effects and non-pharmacologic comfort measures Outcome: Progressing   Problem: Health Behavior/Discharge Planning: Goal: Ability to manage health-related needs will improve Outcome: Progressing

## 2024-01-26 NOTE — Care Management Obs Status (Signed)
 MEDICARE OBSERVATION STATUS NOTIFICATION   Patient Details  Name: Timothy Gallagher MRN: [id number] Date of Birth: January 09, 1936   Medicare Observation Status Notification Given:  Yes    Howell Rucks, RN 01/26/2024, 10:51 AM

## 2024-01-26 NOTE — TOC Initial Note (Signed)
 Transition of Care [facility]) - Initial/Assessment Note    Patient Details  Name: Timothy Gallagher MRN: [id number] Date of Birth: May 07, 1936  Transition of Care [facility]) CM/SW Contact:    Howell Rucks, RN Phone Number: 01/26/2024, 11:07 AM  Clinical Narrative:      Met with pt and spouse at bedside to introduce role of TOC/NCM and review for dc planning, pt with noted cognitive deficits, spouse answered assessment questions, reports pt has an established PCP and pharmacy, no current home care services for home DME, MOON explained to spouse, pt gave verbal approval for spouse to sign MOON forms, copy provided to patient. TOC will continue to follow.              Expected Discharge Plan: Home/Self Care Barriers to Discharge: Continued Medical Work up   Patient Goals and CMS Choice Patient states their goals for this hospitalization and ongoing recovery are:: return home          Expected Discharge Plan and Services       Living arrangements for the past 2 months: Single Family Home                                      Prior Living Arrangements/Services Living arrangements for the past 2 months: Single Family Home Lives with:: Spouse Patient language and need for interpreter reviewed:: Yes Do you feel safe going back to the place where you live?: Yes      Need for Family Participation in Patient Care: Yes (Comment) Care giver support system in place?: Yes (comment)   Criminal Activity/Legal Involvement Pertinent to Current Situation/Hospitalization: No - Comment as needed  Activities of Daily Living   ADL Screening (condition at time of admission) Independently performs ADLs?: Yes (appropriate for developmental age) Is the patient deaf or have difficulty hearing?: No Does the patient have difficulty seeing, even when wearing glasses/contacts?: No Does the patient have difficulty concentrating, remembering, or making decisions?: Yes  Permission Sought/Granted                   Emotional Assessment Appearance:: Appears stated age Attitude/Demeanor/Rapport: Gracious Affect (typically observed): Accepting Orientation: : Oriented to Self, Oriented to Place, Oriented to  Time, Oriented to Situation Alcohol / Substance Use: Not Applicable Psych Involvement: No (comment)  Admission diagnosis:  Ureteral colic [N23] Renal colic on right side [N23] Acute kidney injury [facility]) [N17.9] Patient Active Problem List   Diagnosis Date Noted   Hydronephrosis 01/25/2024   Ureteral colic 01/25/2024   Graves disease 03/05/2023   Aortic atherosclerosis (HCC) 01/05/2023   Hyperglycemia 07/22/2022   Bilateral nephrolithiasis 07/07/2022   Diverticular disease 07/07/2022   Iliac aneurysm (HCC) 07/07/2022   Enlarged prostate 07/07/2022   Mixed vascular and neurodegenerative dementia without behavioral disturbance (HCC) 11/28/2021   Abdominal aortic aneurysm (AAA) without rupture, unspecified part (HCC) 11/27/2021   Chronic obstructive pulmonary disease (HCC) 08/26/2019   History of atrial fibrillation 08/26/2019   Degeneration of lumbar intervertebral disc 10/21/2018   Hyperthyroidism 01/13/2018   Detached retina, left 06/30/2017   AAA (abdominal aortic aneurysm) (HCC) 05/12/2017   Left sided abdominal pain 04/15/2017   Low back pain 02/06/2017   BPPV (benign paroxysmal positional vertigo) 09/01/2016   Anal fissure 04/07/2016   Internal and external bleeding hemorrhoids 01/22/2015   Insomnia 11/22/2014   Anemia 07/20/2014   CAD (coronary artery disease) of artery bypass graft  05/19/2014   BPH (benign prostatic hyperplasia) 12/05/2013   Palpitations 02/11/2013   OSTEOARTHRITIS, WRIST, RIGHT 08/05/2010   ACNE ROSACEA 06/27/2009   ESOPHAGEAL STRICTURE 04/27/2009   ACTINIC KERATOSIS, HEAD 04/18/2009   NECK PAIN 09/14/2008   Hyperlipidemia 03/13/2008   NEUROPATHY, IDIOPATHIC PERIPHERAL NEC 08/11/2007   Irritable bowel syndrome 08/11/2007   B12 deficiency  06/07/2007   Essential hypertension 04/09/2007   ALLERGIC RHINITIS 04/09/2007   PCP:  [name], MD Pharmacy:   [id number] [place], [place] - [address] AT [facility] OF [facility] & [address] [place] [address] Phone: [phone] Fax: [phone]     Social Drivers of Health (SDOH) Social History: SDOH Screenings   Food Insecurity: No Food Insecurity (01/26/2024)  Housing: Low Risk  (01/26/2024)  Transportation Needs: No Transportation Needs (01/26/2024)  Utilities: Not At Risk (01/26/2024)  Depression (PHQ2-9): Low Risk  (10/20/2023)  Financial Resource Strain: Low Risk  (10/20/2023)  Physical Activity: Inactive (10/20/2023)  Social Connections: Moderately Isolated (01/26/2024)  Stress: No Stress Concern Present (10/20/2023)  Tobacco Use: Medium Risk (01/25/2024)  Health Literacy: Adequate Health Literacy (10/20/2023)   SDOH Interventions:     Readmission Risk Interventions     No data to display

## 2024-01-27 ENCOUNTER — Encounter (HOSPITAL_COMMUNITY): Payer: Self-pay | Admitting: Internal Medicine

## 2024-01-27 ENCOUNTER — Other Ambulatory Visit: Payer: Self-pay | Admitting: Urology

## 2024-01-27 ENCOUNTER — Encounter (HOSPITAL_COMMUNITY): Payer: Self-pay | Admitting: Urology

## 2024-01-27 DIAGNOSIS — N23 Unspecified renal colic: Secondary | ICD-10-CM | POA: Diagnosis not present

## 2024-01-27 LAB — BASIC METABOLIC PANEL
Anion gap: 11 (ref 5–15)
BUN: 18 mg/dL (ref 8–23)
CO2: 19 mmol/L — ABNORMAL LOW (ref 22–32)
Calcium: 8.2 mg/dL — ABNORMAL LOW (ref 8.9–10.3)
Chloride: 107 mmol/L (ref 98–111)
Creatinine, Ser: 1.73 mg/dL — ABNORMAL HIGH (ref 0.61–1.24)
GFR, Estimated: 38 mL/min — ABNORMAL LOW (ref >60)
Glucose, Bld: 95 mg/dL (ref 70–99)
Potassium: 4.1 mmol/L (ref 3.5–5.1)
Sodium: 137 mmol/L (ref 135–145)

## 2024-01-27 LAB — SODIUM, URINE, RANDOM: Sodium, Ur: 158 mmol/L

## 2024-01-27 MED ORDER — TAMSULOSIN HCL 0.4 MG PO CAPS: 0.4000 mg | ORAL_CAPSULE | Freq: Two times a day (BID) | ORAL | 0 refills | Status: DC

## 2024-01-27 NOTE — Progress Notes (Signed)
 Reviewed written d/c instructions w pt and his wife and all questions answered. They verbalized understanding. D/C via w/c w all belongings in stable condtiion.

## 2024-01-27 NOTE — Plan of Care (Signed)

## 2024-01-27 NOTE — Progress Notes (Signed)
 Spoke w/ via phone for pre-op interview---[name] (wife) Lab needs dos----KUB               COVID test -----patient states asymptomatic no test needed Arrive at -------0915 NPO after MN NO Solid Food.  Clear liquids from MN until---0500 Med rec completed. Pt aware to hold ASA/NSAIDs and supplements per [facility] protocol.  Medications to take morning of surgery ----- Diabetic/Weight loss medication ----- No Alcohol or recreational drugs for 24 hours/Tobacco products for 6 hours ----0500 Patient instructed to bring blue lithotripsy folder, photo id and insurance card day of surgery. Patient aware to have Driver (ride ) / caregiver for 24 hours after surgery ----- Patient Special Instructions -----stop all ASA, ibuprofen, advil, motrin, aleve, Pepto Bismol Pre-Op special Instructions ----- take laxative of choice day before procedure Patient verbalized understanding of instructions that were given at this phone interview. Patient denies shortness of breath, chest pain, fever, cough at this phone interview.

## 2024-01-27 NOTE — Plan of Care (Signed)
   Problem: Nutrition: Goal: Adequate nutrition will be maintained Outcome: Not Progressing

## 2024-01-27 NOTE — Discharge Summary (Signed)
 Physician Discharge Summary  Timothy Gallagher WGN:[id number] DOB: 1936/09/25 DOA: 01/25/2024  PCP: [name], MD  Admit date: 01/25/2024 Discharge date: 01/27/2024  Admitted From: Home Disposition:  Home  Discharge Condition:Stable CODE STATUS:FULL Diet recommendation: Regular  Brief/Interim Summary: Patient is a 88 year old male with history of cognitive deficits, hypertension, hyperlipidemia, hyperthyroidism who presented from home with complaint of right lower quadrant pain, nausea.  CT imaging showed 7 mm right UPJ stone with mild right hydronephrosis, nonobstructing bilateral renal calculi.  Urology consulted. Urology planning for shockwave lithotripsy as an outpatient on Friday.  Abdomen pain has resolved.  Medically stable for discharge today   Following problems were addressed during the hospitalization:  Nephrolithiasis: Presented with abdominal pain mainly in the right lower quadrant, nausea. CT imaging showed 7 mm right UPJ stone with mild right hydronephrosis, nonobstructing bilateral renal calculi.  Urology consulted.  Patient symptoms have significantly improved.  Started on Flomax.   No concern for infection.  Renal ultrasound  showed stable mild right-sided hydronephrosis.  Urology, Dr. Cheree Ditto planning for shockwave lithotripsy on Friday as an outpatient   Abdominal aortic aneurysm: Previously known.Currently at 4 cm.  Annual follow-up with abdominal ultrasound recommended   AKI: Baseline creatinine normal.  Presented with creatinine in the range of 1.8.  Slightly improved with IV fluid.  Patient instructed to take plenty of fluid at home.  He needs to follow-up with his PCP in a week and check his kidney function.   History of dementia: On Aricept, memantine   History of hyperthyroidism: Takes methimazole at home   History of hyperlipidemia: On Crestor   History of hypertension: Currently blood pressure stable.  Home valsartan will be stopped for AKI  Discharge  Diagnoses:  Principal Problem:   Ureteral colic Active Problems:   AAA (abdominal aortic aneurysm) [facility])   Hydronephrosis    Discharge Instructions  Discharge Instructions     Diet general   Complete by: As directed    Discharge instructions   Complete by: As directed    1)Please take your medications as instructed 2)Follow up for urological procedure on Friday 3)Follow up with your PCP in a week.  Do a BMP test to check your kidney function during the follow-up   Increase activity slowly   Complete by: As directed       Allergies as of 01/27/2024       Reactions   Lipitor [atorvastatin] Other (See Comments)   REACTION: nausea and blurred vision   Trazodone And Nefazodone Other (See Comments)   dizzy   Ciprofloxacin Swelling   Mycophenolate Mofetil Other (See Comments)   REACTION: unspecified   Amoxicillin Rash   Penicillins Rash   Rosuvastatin Other (See Comments)   Unknown         Medication List     STOP taking these medications    valsartan 40 MG tablet Commonly known as: DIOVAN       TAKE these medications    aspirin EC 81 MG tablet Take 1 tablet (81 mg total) by mouth daily.   CENTRUM SILVER PO Take 1 tablet by mouth daily.   donepezil 10 MG tablet Commonly known as: ARICEPT Take 1 tablet (10 mg total) by mouth daily.   FLUoxetine 20 MG capsule Commonly known as: PROZAC TAKE 1 CAPSULE(20 MG) BY MOUTH DAILY What changed: See the new instructions.   memantine 10 MG tablet Commonly known as: NAMENDA Take 1 tablet  twice a day What changed:  how much to  take how to take this when to take this   methimazole 5 MG tablet Commonly known as: TAPAZOLE Take 0.5 tablets (2.5 mg total) by mouth daily. What changed: when to take this   rosuvastatin 40 MG tablet Commonly known as: Crestor Take 1 tablet (40 mg total) by mouth daily. Dose increase 01/13/22 due to heart disease and LDL goal under 70   tamsulosin 0.4 MG Caps capsule Commonly  known as: FLOMAX Take 1 capsule (0.4 mg total) by mouth 2 (two) times daily. What changed:  when to take this additional instructions        Follow-up Information     [name], MD. Schedule an appointment as soon as possible for a visit in 1 week(s).   Specialty: Family Medicine Contact information: [address] [place] [address] [phone]                Allergies  Allergen Reactions   Lipitor [Atorvastatin] Other (See Comments)    REACTION: nausea and blurred vision   Trazodone And Nefazodone Other (See Comments)    dizzy   Ciprofloxacin Swelling   Mycophenolate Mofetil Other (See Comments)    REACTION: unspecified   Amoxicillin Rash   Penicillins Rash   Rosuvastatin Other (See Comments)    Unknown     Consultations: Urology   Procedures/Studies: DG Abd 1 View Result Date: 01/26/2024 CLINICAL DATA:  Known renal and ureteral stones EXAM: ABDOMEN - 1 VIEW COMPARISON:  CT from the previous day. FINDINGS: Bilateral nonobstructing lower pole renal stones are again seen and stable from the prior CT. Right UPJ stone is noted and stable. Degenerative changes of lumbar spine are noted. No free air is seen. IMPRESSION: Bilateral renal and right ureteral stones stable from prior CT. Electronically Signed   By: Alcide Clever M.D.   On: 01/26/2024 10:00   US RENAL Result Date: 01/26/2024 CLINICAL DATA:  Follow-up right hydronephrosis EXAM: RENAL / URINARY TRACT ULTRASOUND COMPLETE COMPARISON:  CT from the previous day. FINDINGS: Right Kidney: Renal measurements: 10.5 x 6.4 x 7.1 cm. = volume: 248 mL. Mild hydronephrosis is noted similar to that seen on the prior exam. Lower pole renal stone measuring up to 1 cm is noted and stable. The known UPJ stone is not well appreciated. Left Kidney: Renal measurements: 11.9 x 5.2 x 5.0 cm. = volume: 163 mL. 7 mm stone is noted in the lower pole similar to that seen on the prior exam cysts are noted in the upper and  lower pole similar to that noted on prior CT. Largest of these measures up to 1.9 cm. No further follow-up is recommended. Bladder: Appears normal for degree of bladder distention. Other: None. IMPRESSION: Bilateral nonobstructing renal calculi. Stable right-sided hydronephrosis of a mild degree. Electronically Signed   By: Alcide Clever M.D.   On: 01/26/2024 09:59   CT ABDOMEN PELVIS WO CONTRAST Result Date: 01/25/2024 CLINICAL DATA:  Right lower quadrant abdominal pain. Concern for acute appendicitis. EXAM: CT ABDOMEN AND PELVIS WITHOUT CONTRAST TECHNIQUE: Multidetector CT imaging of the abdomen and pelvis was performed following the standard protocol without IV contrast. RADIATION DOSE REDUCTION: This exam was performed according to the departmental dose-optimization program which includes automated exposure control, adjustment of the mA and/or kV according to patient size and/or use of iterative reconstruction technique. COMPARISON:  CT abdomen pelvis dated 07/03/2022. FINDINGS: Evaluation of this exam is limited in the absence of intravenous contrast. Lower chest: The visualized lung bases are clear.  There is coronary vascular calcification. No intra-abdominal free air or free fluid. Hepatobiliary: Subcentimeter hypodense lesions in the liver are too small to characterize but stable since the prior CT, likely cysts or hemangioma. No biliary ductal dilatation. The gallbladder is unremarkable. Pancreas: Unremarkable. No pancreatic ductal dilatation or surrounding inflammatory changes. Spleen: Normal in size without focal abnormality. Adrenals/Urinary Tract: The adrenal glands unremarkable. There is a 7 mm stone in the proximal right ureter at the ureteropelvic junction with mild right hydronephrosis. Several additional nonobstructing right renal calculi measure up to 6 mm in the interpolar kidney and 7 mm in the lower pole. Multiple nonobstructing left renal calculi measure up to 10 mm in the inferior pole.  There is no hydronephrosis on the left. Left renal cysts suboptimally evaluated on this noncontrast CT but present on the prior CT. Mildly trabeculated bladder wall suggestive of chronic bladder outlet obstruction. Stomach/Bowel: There is sigmoid diverticulosis. Moderate amount of stool throughout the colon. There is no bowel obstruction or active inflammation. The appendix is normal. Vascular/Lymphatic: Advanced aortoiliac atherosclerotic disease. There is a 4 cm infrarenal abdominal aortic aneurysm. The IVC is unremarkable. No portal venous gas. There is no adenopathy. Reproductive: Enlarged prostate gland measuring 5.8 cm in transverse axial diameter. The seminal vesicles are symmetric. Other: Small fat containing umbilical and left inguinal hernias. Musculoskeletal: Osteopenia with degenerative changes. No acute osseous pathology. IMPRESSION: 1. A 7 mm right UPJ stone with mild right hydronephrosis. 2. Additional nonobstructing bilateral renal calculi. 3. Sigmoid diverticulosis. No bowel obstruction. Normal appendix. 4. Enlarged prostate gland. 5. A 4 cm infrarenal abdominal aortic aneurysm. Recommend follow-up CT or MR as appropriate in 12 months and referral to or continued care with vascular specialist. (Ref.: J Vasc Surg. 2018; 67:2-77 and J Am Coll Radiol 2013;10(10):789-794.) Electronically Signed   By: Elgie Collard M.D.   On: 01/25/2024 18:49      Subjective: Patient seen and examined at bedside today.  Hemodynamically stable.  Comfortable.  Denies any abdominal pain, nausea or vomiting.  He really wants to go home and does not want to stay another day.  Discharge planning discharge with the patient and wife with instructions  Discharge Exam: Vitals:   01/27/24 0200 01/27/24 0609  BP: 128/80 (!) 127/54  Pulse: 88 83  Resp: 17 15  Temp: 99.3 F (37.4 C) 99.2 F (37.3 C)  SpO2: 93% 94%   Vitals:   01/26/24 2254 01/27/24 0200 01/27/24 0609 01/27/24 1000  BP: 128/80 128/80 (!) 127/54    Pulse: 88 88 83   Resp: 18 17 15    Temp: 99.7 F (37.6 C) 99.3 F (37.4 C) 99.2 F (37.3 C)   TempSrc: Oral Oral Oral   SpO2: 93% 93% 94%   Weight:    83.9 kg  Height:    6\' 2"  (1.88 m)    General: Pt is alert, awake, not in acute distress Cardiovascular: RRR, S1/S2 +, no rubs, no gallops Respiratory: CTA bilaterally, no wheezing, no rhonchi Abdominal: Soft, NT, ND, bowel sounds + Extremities: no edema, no cyanosis    The results of significant diagnostics from this hospitalization (including imaging, microbiology, ancillary and laboratory) are listed below for reference.     Microbiology: No results found for this or any previous visit (from the past 240 hours).   Labs: BNP (last 3 results) No results for input(s): "BNP" in the last 8760 hours. Basic Metabolic Panel: Recent Labs  Lab 01/25/24 1149 01/26/24 0444 01/27/24 0514  NA 140 137 137  K 4.1 4.3 4.1  CL 106 110 107  CO2 24 21* 19*  GLUCOSE 107* 101* 95  BUN 25* 22 18  CREATININE 1.85* 1.61* 1.73*  CALCIUM 8.9 8.2* 8.2*   Liver Function Tests: Recent Labs  Lab 01/25/24 1149  AST 37  ALT 22  ALKPHOS 65  BILITOT 0.9  PROT 7.3  ALBUMIN 3.5   Recent Labs  Lab 01/25/24 1149  LIPASE 32   No results for input(s): "AMMONIA" in the last 168 hours. CBC: Recent Labs  Lab 01/25/24 1149 01/26/24 0444  WBC 7.5 6.8  HGB 12.6* 11.2*  HCT 41.2 35.7*  MCV 89.4 87.9  PLT 170 146*   Cardiac Enzymes: No results for input(s): "CKTOTAL", "CKMB", "CKMBINDEX", "TROPONINI" in the last 168 hours. BNP: Invalid input(s): "POCBNP" CBG: No results for input(s): "GLUCAP" in the last 168 hours. D-Dimer No results for input(s): "DDIMER" in the last 72 hours. Hgb A1c No results for input(s): "HGBA1C" in the last 72 hours. Lipid Profile No results for input(s): "CHOL", "HDL", "LDLCALC", "TRIG", "CHOLHDL", "LDLDIRECT" in the last 72 hours. Thyroid function studies No results for input(s): "TSH", "T4TOTAL",  "T3FREE", "THYROIDAB" in the last 72 hours.  Invalid input(s): "FREET3" Anemia work up No results for input(s): "VITAMINB12", "FOLATE", "FERRITIN", "TIBC", "IRON", "RETICCTPCT" in the last 72 hours. Urinalysis    Component Value Date/Time   COLORURINE YELLOW 01/25/2024 1149   APPEARANCEUR CLEAR 01/25/2024 1149   LABSPEC 1.019 01/25/2024 1149   PHURINE 5.0 01/25/2024 1149   GLUCOSEU NEGATIVE 01/25/2024 1149   GLUCOSEU NEGATIVE 01/12/2024 1056   HGBUR SMALL (A) 01/25/2024 1149   BILIRUBINUR NEGATIVE 01/25/2024 1149   BILIRUBINUR neg 05/16/2021 0932   KETONESUR NEGATIVE 01/25/2024 1149   PROTEINUR 100 (A) 01/25/2024 1149   UROBILINOGEN 1.0 01/12/2024 1056   NITRITE NEGATIVE 01/25/2024 1149   LEUKOCYTESUR NEGATIVE 01/25/2024 1149   Sepsis Labs Recent Labs  Lab 01/25/24 1149 01/26/24 0444  WBC 7.5 6.8   Microbiology No results found for this or any previous visit (from the past 240 hours).  Please note: You were cared for by a hospitalist during your hospital stay. Once you are discharged, your primary care physician will handle any further medical issues. Please note that NO REFILLS for any discharge medications will be authorized once you are discharged, as it is imperative that you return to your primary care physician (or establish a relationship with a primary care physician if you do not have one) for your post hospital discharge needs so that they can reassess your need for medications and monitor your lab values.    Time coordinating discharge: 40 minutes  SIGNED:   Burnadette Pop, MD  [facility] 01/27/2024, 10:44 AM Pager [phone]  If 7PM-7AM, please contact night-coverage [email or web address] Password [email or web address]

## 2024-01-28 ENCOUNTER — Telehealth: Payer: Self-pay

## 2024-01-28 NOTE — Transitions of Care (Post Inpatient/ED Visit) (Signed)
   01/28/2024  Name: Timothy Gallagher MRN: [id number] DOB: 08/27/1936  Today's TOC FU Call Status: Today's TOC FU Call Status:: Unsuccessful Call (1st Attempt) Unsuccessful Call (1st Attempt) Date: 01/28/24  Attempted to reach the patient regarding the most recent Inpatient/ED visit.  Follow Up Plan: Additional outreach attempts will be made to reach the patient to complete the Transitions of Care (Post Inpatient/ED visit) call.   Signature Karena Addison, LPN [facility] Nurse Health Advisor Direct Dial [phone]

## 2024-01-29 ENCOUNTER — Ambulatory Visit (HOSPITAL_COMMUNITY)

## 2024-01-29 ENCOUNTER — Encounter (HOSPITAL_COMMUNITY)
Admission: RE | Disposition: A | Discharge status: Home / Self Care | Payer: Self-pay | Source: Home / Self Care | Attending: Urology

## 2024-01-29 ENCOUNTER — Other Ambulatory Visit: Payer: Self-pay

## 2024-01-29 ENCOUNTER — Ambulatory Visit (HOSPITAL_COMMUNITY)
Admission: RE | Admit: 2024-01-29 | Discharge: 2024-01-29 | Disposition: A | Discharge status: Home / Self Care | Attending: Urology | Admitting: Urology

## 2024-01-29 ENCOUNTER — Encounter (HOSPITAL_COMMUNITY): Payer: Self-pay | Admitting: Urology

## 2024-01-29 DIAGNOSIS — N132 Hydronephrosis with renal and ureteral calculous obstruction: Secondary | ICD-10-CM | POA: Insufficient documentation

## 2024-01-29 DIAGNOSIS — N201 Calculus of ureter: Secondary | ICD-10-CM | POA: Diagnosis not present

## 2024-01-29 HISTORY — PX: EXTRACORPOREAL SHOCK WAVE LITHOTRIPSY: SHX1557

## 2024-01-29 SURGERY — LITHOTRIPSY, ESWL
Anesthesia: LOCAL | Laterality: Right

## 2024-01-29 MED ORDER — SODIUM CHLORIDE 0.9 % IV SOLN
1.0000 g | Freq: Once | INTRAVENOUS | Status: AC
  Administered 2024-01-29: 1 g via INTRAVENOUS
  Filled 2024-01-29: qty 10

## 2024-01-29 MED ORDER — DIAZEPAM 5 MG PO TABS
ORAL_TABLET | ORAL | Status: AC
  Filled 2024-01-29: qty 1

## 2024-01-29 MED ORDER — DIPHENHYDRAMINE HCL 25 MG PO CAPS
25.0000 mg | ORAL_CAPSULE | ORAL | Status: AC
  Administered 2024-01-29: 25 mg via ORAL
  Filled 2024-01-29: qty 1

## 2024-01-29 MED ORDER — TRAMADOL HCL 50 MG PO TABS: 50.0000 mg | ORAL_TABLET | Freq: Four times a day (QID) | ORAL | 0 refills | Status: AC | PRN

## 2024-01-29 MED ORDER — SODIUM CHLORIDE 0.9 % IV SOLN
INTRAVENOUS | Status: DC
  Administered 2024-01-29: 1000 mL via INTRAVENOUS

## 2024-01-29 MED ORDER — DIAZEPAM 5 MG PO TABS
10.0000 mg | ORAL_TABLET | ORAL | Status: AC
  Administered 2024-01-29: 5 mg via ORAL

## 2024-01-29 NOTE — Discharge Instructions (Addendum)
 1. You should strain your urine and collect all fragments and bring them to your follow up appointment.  2. You should take your pain medication as needed.  Please call if your pain is severe to the point that it is not controlled with your pain medication. 3. You should call if you develop fever > 101 or persistent nausea or vomiting. 4. Your doctor may prescribe tamsulosin to take to help facilitate stone passage.    Post Anesthesia Home Care Instructions  Activity: Get plenty of rest for the remainder of the day. A responsible individual must stay with you for 24 hours following the procedure.  For the next 24 hours, DO NOT: -Drive a car -Advertising copywriter -Drink alcoholic beverages -Take any medication unless instructed by your physician -Make any legal decisions or sign important papers.  Meals: Start with liquid foods such as gelatin or soup. Progress to regular foods as tolerated. Avoid greasy, spicy, heavy foods. If nausea and/or vomiting occur, drink only clear liquids until the nausea and/or vomiting subsides. Call your physician if vomiting continues.  Special Instructions/Symptoms: Your throat may feel dry or sore from the anesthesia or the breathing tube placed in your throat during surgery. If this causes discomfort, gargle with warm salt water. The discomfort should disappear within 24 hours.

## 2024-01-29 NOTE — Interval H&P Note (Signed)
 History and Physical Interval Note:  01/29/2024 11:13 AM  Mylik A Geppert  has presented today for surgery, with the diagnosis of RIGHT URETERAL STONE.  The various methods of treatment have been discussed with the patient and family. After consideration of risks, benefits and other options for treatment, the patient has consented to  Procedure(s): LITHOTRIPSY, ESWL (Right) as a surgical intervention.  The patient's history has been reviewed, patient examined, no change in status, stable for surgery.  I have reviewed the patient's chart and labs.  Questions were answered to the patient's satisfaction.     Rakan Soffer L Jemia Fata

## 2024-01-29 NOTE — Op Note (Signed)
 See [employer] operative note scanned into chart. Also because of the size, density, location and other factors that cannot be anticipated I feel this will likely be a staged procedure. This fact supersedes any indication in the scanned [place] stone operative note to the contrary.  Irine Seal MD 01/29/2024, 12:39 PM  [facility]  Pager: [phone]

## 2024-02-01 ENCOUNTER — Encounter: Payer: Self-pay | Admitting: Family

## 2024-02-01 ENCOUNTER — Ambulatory Visit (INDEPENDENT_AMBULATORY_CARE_PROVIDER_SITE_OTHER): Admitting: Family

## 2024-02-01 VITALS — BP 130/85 | HR 82 | Temp 98.0°F | Ht 74.0 in | Wt 180.4 lb

## 2024-02-01 DIAGNOSIS — R053 Chronic cough: Secondary | ICD-10-CM

## 2024-02-01 MED ORDER — PREDNISONE 20 MG PO TABS: ORAL_TABLET | ORAL | 0 refills | Status: DC

## 2024-02-01 MED ORDER — DOXYCYCLINE HYCLATE 100 MG PO TABS: 100.0000 mg | ORAL_TABLET | Freq: Two times a day (BID) | ORAL | 0 refills | Status: AC

## 2024-02-01 NOTE — Patient Instructions (Signed)
 It was very nice to see you today!   I have sent over an antibiotic to start today, Doxycycline. I also sent Prednisone, to help with inflammation in the lungs, start this tomorrow, take after eating breakfast. Ok to continue the cough syrup as needed. Drink plenty of water!      PLEASE NOTE:  If you had any lab tests please let us know if you have not heard back within a few days. You may see your results on MyChart before we have a chance to review them but we will give you a call once they are reviewed by [place]. If we ordered any referrals today, please let us know if you have not heard from their office within the next week.

## 2024-02-01 NOTE — Progress Notes (Signed)
 Patient ID: Timothy Gallagher, male    DOB: [birth year], 88 y.o.   MRN: [id number]  Chief Complaint  Patient presents with   Cough    Pt c/o cough with greenish mucus. Present for 2 weeks. Has tried delsym which did not help sx.        Discussed the use of AI scribe software for clinical note transcription with the patient, who gave verbal consent to proceed.  History of Present Illness   The patient, with a history of kidney stones and hypertension, presents with his wife for a persistent cough and frequent urination. The cough started about a week ago, following a hospital visit for a suspected kidney stone. The cough is severe, causing the patient to wake up multiple times at night, and is associated with the production of yellow-green mucus. The patient also reports frequent urination, especially at night, leading to incontinence. The frequency of urination increased after the dosage of Flomax was increased from once to twice daily after his recent hospital visit. The patient also reports that Valsartan 40mg  qd, a medication for hypertension, was stopped during the same visit.     Assessment & Plan:     Cough - Persistent cough with green and yellow mucus suggests infection. No asthma or COPD. Severe enough to cause sleep disturbances and urinary incontinence. Prednisone considered for inflammation and cough suppression. Lung exam revealed mild wheezing on inspiration, cleared with cough. - Prescribe Doxycycline 100mg  bid for infection. - Prescribe prednisone in the morning to help lung inflammation. - Start antibiotic immediately, prednisone the following morning to avoid nighttime insomnia. -Call the office if sx are not better after finishing the medications.  Urinary Incontinence - Increased frequency and incontinence, possibly worsened by cough. Tamsulosin increased during hospital stay may have exacerbated symptoms and affected blood pressure. - Reduce tamsulosin to once daily,  monitor urinary sx and let Urologist know how he is doing. - Discuss symptoms with urologist on March 28. -Call the office if any concerns prior to 3/28.  Hypertension - Previously on valsartan 40mg  qd, discontinued during recent hospital stay. BP wnl today.  Reassessment needed with tamsulosin changes. Pt wife reports they do not have a BP cuff at home. - Reassess blood pressure next Monday during visit with PCP. - Discuss management with urologist and primary care.  -Advised to call office if any sx of high BP, headache, dizziness, feeling flushed.     Subjective:    Outpatient Medications Prior to Visit  Medication Sig Dispense Refill   aspirin 81 MG EC tablet Take 1 tablet (81 mg total) by mouth daily.     donepezil (ARICEPT) 10 MG tablet Take 1 tablet (10 mg total) by mouth daily. 30 tablet 11   FLUoxetine (PROZAC) 20 MG capsule TAKE 1 CAPSULE(20 MG) BY MOUTH DAILY (Patient taking differently: Take 20 mg by mouth daily.) 90 capsule 3   memantine (NAMENDA) 10 MG tablet Take 1 tablet  twice a day (Patient taking differently: Take 10 mg by mouth 2 (two) times daily. Take 1 tablet  twice a day) 60 tablet 11   methimazole (TAPAZOLE) 5 MG tablet Take 0.5 tablets (2.5 mg total) by mouth daily. (Patient taking differently: Take 2.5 mg by mouth every other day.) 45 tablet 3   Multiple Vitamins-Minerals (CENTRUM SILVER PO) Take 1 tablet by mouth daily.     rosuvastatin (CRESTOR) 40 MG tablet Take 1 tablet (40 mg total) by mouth daily. Dose increase 01/13/22 due to heart disease  and LDL goal under 70 90 tablet 3   tamsulosin (FLOMAX) 0.4 MG CAPS capsule Take 1 capsule (0.4 mg total) by mouth 2 (two) times daily. 60 capsule 0   traMADol (ULTRAM) 50 MG tablet Take 1 tablet (50 mg total) by mouth every 6 (six) hours as needed for up to 3 days. 10 tablet 0   No facility-administered medications prior to visit.   Past Medical History:  Diagnosis Date   ACNE ROSACEA 06/27/2009   ACTINIC KERATOSIS,  HEAD 04/18/2009   Acute maxillary sinusitis 05/14/2010   ALLERGIC RHINITIS 04/09/2007   Anal fissure 04/07/2016   B12 DEFICIENCY 06/07/2007   BACK PAIN WITH RADICULOPATHY 04/24/2008   Bilateral nephrolithiasis 07/07/2022   CT abd 06/2022 1. Nonobstructive bilateral nephrolithiasis measuring up to 5 mm on the right and 6 mm on the left.   Cancer [facility])    skin   CHEST WALL PAIN, ACUTE 06/15/2009   Chronic maxillary sinusitis 05/29/2008   COLITIS 04/27/2009   COLONIC POLYPS, HX OF 04/27/2009   tubular adenomas   Coronary artery disease 05/12/2014   Cath 05/12/2014 w/ severe 3-vessel CAD and preserved LV function, EF 55%   DERMATITIS, ATOPIC 10/12/2007   Diverticular disease 07/07/2022   CT abd 06/2022 . Colonic diverticulosis with no acute diverticulitis.   DIVERTICULOSIS, COLON 04/27/2009   ECCHYMOSES, SPONTANEOUS 06/27/2008   Elevated sedimentation rate 05/02/2009   ESOPHAGEAL STRICTURE 04/27/2009   GASTRITIS, CHRONIC 04/27/2009   HYPERLIPIDEMIA 03/13/2008   HYPERTENSION 04/09/2007   Hyperthyroidism    Iliac aneurysm (HCC) 07/07/2022   CT abd 06/2022 . Aneurysmal left common iliac artery (1.6 cm).     Internal bleeding hemorrhoids 01/22/2015   01/22/2015 Seen at anoscopy, grade 1 all 3 positions    Irritable bowel syndrome 08/11/2007   KIDNEY DISEASE 04/09/2007   NECK PAIN 09/14/2008   NEUROPATHY, IDIOPATHIC PERIPHERAL NEC 08/11/2007   OSTEOARTHRITIS, WRIST, RIGHT 08/05/2010   Postoperative delirium 05/20/2014   S/P CABG x 4 05/19/2014   LIMA to LAD, SVG to diag, SVG to OM, SVG to PDA, EVH via right thigh and leg   Past Surgical History:  Procedure Laterality Date   CARDIAC CATHETERIZATION     COLONOSCOPY     CORONARY ARTERY BYPASS GRAFT N/A 05/19/2014   Procedure: CORONARY ARTERY BYPASS GRAFTING (CABG);  Surgeon: Purcell Nails, MD;  Location: [facility] OR;  Service: Open Heart Surgery;  Laterality: N/A;  Times 4 using left internal mammary artery and endoscopically harvested right  saphenous vein   ESOPHAGOGASTRODUODENOSCOPY     FINGER SURGERY     cut off end of finger   FLEXIBLE SIGMOIDOSCOPY     HEMORRHOID BANDING     HERNIA REPAIR     INCISION AND DRAINAGE WOUND WITH FOREIGN BODY REMOVAL Left 12/20/2013   Procedure: INCISION AND DRAINAGE LEFT INDEX FINGER;  Surgeon: Tami Ribas, MD;  Location: [facility] ORS;  Service: Orthopedics;  Laterality: Left;   INTRAOPERATIVE TRANSESOPHAGEAL ECHOCARDIOGRAM N/A 05/19/2014   Procedure: INTRAOPERATIVE TRANSESOPHAGEAL ECHOCARDIOGRAM;  Surgeon: Purcell Nails, MD;  Location: [facility] OR;  Service: Open Heart Surgery;  Laterality: N/A;   lamenectomy     LEFT HEART CATHETERIZATION WITH CORONARY ANGIOGRAM N/A 05/12/2014   Procedure: LEFT HEART CATHETERIZATION WITH CORONARY ANGIOGRAM;  Surgeon: Lesleigh Noe, MD;  Location: [facility] CATH LAB;  Service: Cardiovascular;  Laterality: N/A;   LUMBAR FUSION     TONSILLECTOMY     VARICOSE VEIN SURGERY Left    Allergies  Allergen Reactions  Lipitor [Atorvastatin] Other (See Comments)    REACTION: nausea and blurred vision   Trazodone And Nefazodone Other (See Comments)    dizzy   Ciprofloxacin Swelling   Mycophenolate Mofetil Other (See Comments)    REACTION: unspecified   Amoxicillin Rash   Penicillins Rash   Rosuvastatin Other (See Comments)    Unknown       Objective:    Physical Exam Vitals and nursing note reviewed.  Constitutional:      General: He is not in acute distress.    Appearance: Normal appearance.  HENT:     Head: Normocephalic.  Cardiovascular:     Rate and Rhythm: Normal rate and regular rhythm.  Pulmonary:     Effort: Pulmonary effort is normal.     Breath sounds: Normal breath sounds.  Musculoskeletal:        General: Normal range of motion.     Cervical back: Normal range of motion.  Skin:    General: Skin is warm and dry.  Neurological:     Mental Status: He is alert and oriented to person, place, and time.  Psychiatric:        Mood and Affect: Mood normal.    BP 130/85 (BP Location: Left Arm, Patient Position: Sitting, Cuff Size: Large)   Pulse 82   Temp 98 F (36.7 C) (Temporal)   Ht 6\' 2"  (1.88 m)   Wt 180 lb 6.4 oz (81.8 kg)   SpO2 96%   BMI 23.16 kg/m  Wt Readings from Last 3 Encounters:  02/01/24 180 lb 6.4 oz (81.8 kg)  01/29/24 181 lb (82.1 kg)  01/27/24 184 lb 15.5 oz (83.9 kg)      Dulce Sellar, NP

## 2024-02-02 NOTE — Telephone Encounter (Signed)
 Noted thanks

## 2024-02-02 NOTE — Transitions of Care (Post Inpatient/ED Visit) (Signed)
 02/02/2024  Name: Timothy Gallagher MRN: [id number] DOB: 04/26/1936  Today's TOC FU Call Status: Today's TOC FU Call Status:: Successful TOC FU Call Completed Unsuccessful Call (1st Attempt) Date: 01/28/24 [facility] FU Call Complete Date: 02/02/24 Patient's Name and Date of Birth confirmed.  Transition Care Management Follow-up Telephone Call Date of Discharge: 01/27/24 Discharge Facility: [facility]) Type of Discharge: Emergency Department Reason for ED Visit: Other: (cystitis) How have you been since you were released from the hospital?: Better Any questions or concerns?: No  Items Reviewed: Did you receive and understand the discharge instructions provided?: Yes Medications obtained,verified, and reconciled?: Yes (Medications Reviewed) Any new allergies since your discharge?: No Dietary orders reviewed?: Yes Do you have support at home?: Yes People in Home: spouse  Medications Reviewed Today: Medications Reviewed Today     Reviewed by Karena Addison, LPN (Licensed Practical Nurse) on 02/02/24 at 1620  Med List Status: <None>   Medication Order Taking? Sig Documenting Provider Last Dose Status Informant  aspirin 81 MG EC tablet [id number] No Take 1 tablet (81 mg total) by mouth daily. Lyn Records, MD Taking Active Spouse/Significant Other, Pharmacy Records  donepezil (ARICEPT) 10 MG tablet [id number] No Take 1 tablet (10 mg total) by mouth daily. Marcos Eke, PA-C Taking Active Spouse/Significant Other, Pharmacy Records  doxycycline (VIBRA-TABS) 100 MG tablet [id number]  Take 1 tablet (100 mg total) by mouth 2 (two) times daily for 7 days. START today. Dulce Sellar, NP  Active   FLUoxetine (PROZAC) 20 MG capsule [id number] No TAKE 1 CAPSULE(20 MG) BY MOUTH DAILY  Patient taking differently: Take 20 mg by mouth daily.   [name], MD Taking Active Spouse/Significant Other, Pharmacy Records  memantine [facility]) 10 MG tablet [id number] No Take 1 tablet  twice a  day  Patient taking differently: Take 10 mg by mouth 2 (two) times daily. Take 1 tablet  twice a day   Marcos Eke, PA-C Taking Active Spouse/Significant Other, Pharmacy Records  methimazole (TAPAZOLE) 5 MG tablet [id number] No Take 0.5 tablets (2.5 mg total) by mouth daily.  Patient taking differently: Take 2.5 mg by mouth every other day.   Carlus Pavlov, MD Taking Active Spouse/Significant Other, Pharmacy Records  Multiple Vitamins-Minerals (CENTRUM SILVER PO) [id number] No Take 1 tablet by mouth daily. [provider] Taking Active Spouse/Significant Other, Pharmacy Records  predniSONE (DELTASONE) 20 MG tablet [id number]  START tomorrow; Take 3 pills in the morning with breakfast for 1 day,  then 2 pills for 3 days, 1 pill for 2 days. Dulce Sellar, NP  Active   rosuvastatin (CRESTOR) 40 MG tablet [id number] No Take 1 tablet (40 mg total) by mouth daily. Dose increase 01/13/22 due to heart disease and LDL goal under 70 [name], MD Taking Active Spouse/Significant Other, Pharmacy Records  tamsulosin [facility]) 0.4 MG CAPS capsule [id number] No Take 1 capsule (0.4 mg total) by mouth 2 (two) times daily. Burnadette Pop, MD Taking Active             Home Care and Equipment/Supplies: Were Home Health Services Ordered?: NA Any new equipment or medical supplies ordered?: NA  Functional Questionnaire: Do you need assistance with bathing/showering or dressing?: No Do you need assistance with meal preparation?: No Do you need assistance with eating?: No Do you have difficulty maintaining continence: No Do you need assistance with getting out of bed/getting out of a chair/moving?: No Do you have difficulty managing or taking your medications?: Yes  Follow up  appointments reviewed: PCP Follow-up appointment confirmed?: Yes Date of PCP follow-up appointment?: 02/08/24 Follow-up Provider: [facility] Follow-up appointment confirmed?: Yes Date of  Specialist follow-up appointment?: 02/12/24 Follow-Up Specialty Provider:: uro Do you need transportation to your follow-up appointment?: No Do you understand care options if your condition(s) worsen?: Yes-patient verbalized understanding    SIGNATURE Karena Addison, LPN [facility] Nurse Health Advisor Direct Dial [phone]

## 2024-02-03 DIAGNOSIS — H16232 Neurotrophic keratoconjunctivitis, left eye: Secondary | ICD-10-CM | POA: Diagnosis not present

## 2024-02-04 ENCOUNTER — Ambulatory Visit: Admitting: Family Medicine

## 2024-02-08 ENCOUNTER — Ambulatory Visit: Admitting: Family Medicine

## 2024-02-12 DIAGNOSIS — N202 Calculus of kidney with calculus of ureter: Secondary | ICD-10-CM | POA: Diagnosis not present

## 2024-03-03 DIAGNOSIS — K080 Exfoliation of teeth due to systemic causes: Secondary | ICD-10-CM | POA: Diagnosis not present

## 2024-03-04 ENCOUNTER — Other Ambulatory Visit: Payer: Medicare Other

## 2024-03-09 ENCOUNTER — Encounter: Payer: Self-pay | Admitting: Physician Assistant

## 2024-03-09 ENCOUNTER — Ambulatory Visit: Payer: Medicare Other | Admitting: Physician Assistant

## 2024-03-09 VITALS — BP 121/43 | HR 88 | Resp 20 | Ht 74.0 in | Wt 180.0 lb

## 2024-03-09 DIAGNOSIS — G301 Alzheimer's disease with late onset: Secondary | ICD-10-CM | POA: Diagnosis not present

## 2024-03-09 DIAGNOSIS — F0150 Vascular dementia without behavioral disturbance: Secondary | ICD-10-CM

## 2024-03-09 DIAGNOSIS — F02818 Dementia in other diseases classified elsewhere, unspecified severity, with other behavioral disturbance: Secondary | ICD-10-CM | POA: Diagnosis not present

## 2024-03-09 MED ORDER — DIVALPROEX SODIUM 125 MG PO DR TAB: DELAYED_RELEASE_TABLET | ORAL | 11 refills | Status: DC

## 2024-03-09 MED ORDER — DONEPEZIL HCL 10 MG PO TABS: 10.0000 mg | ORAL_TABLET | Freq: Every day | ORAL | 11 refills | Status: DC

## 2024-03-09 MED ORDER — DIVALPROEX SODIUM 125 MG PO DR TAB
DELAYED_RELEASE_TABLET | ORAL | 11 refills | Status: DC
Start: 2024-03-09 — End: 2024-03-09

## 2024-03-09 MED ORDER — MEMANTINE HCL 10 MG PO TABS: ORAL_TABLET | ORAL | 11 refills | Status: DC

## 2024-03-09 NOTE — Patient Instructions (Addendum)
 It was a pleasure to see you today at our office.   Recommendations:  Follow up in 6 months Continue donepezil   10 mg daily.   Continue Memantine  10 mg twice a day.     Depakote  125  mg Take 1 tab at night , may increase to 1 tab twice a day if needed  Make sure to check the hearing  Continue B12 supplements Visit [facility] on FB, Instagram and You Tube      RECOMMENDATIONS FOR ALL PATIENTS WITH MEMORY PROBLEMS: 1. Continue to exercise (Recommend 30 minutes of walking everyday, or 3 hours every week) 2. Increase social interactions - continue going to [place] and enjoy social gatherings with friends and family 3. Eat healthy, avoid fried foods and eat more fruits and vegetables 4. Maintain adequate blood pressure, blood sugar, and blood cholesterol level. Reducing the risk of stroke and cardiovascular disease also helps promoting better memory. 5. Avoid stressful situations. Live a simple life and avoid aggravations. Organize your time and prepare for the next day in anticipation. 6. Sleep well, avoid any interruptions of sleep and avoid any distractions in the bedroom that may interfere with adequate sleep quality 7. Avoid sugar, avoid sweets as there is a strong link between excessive sugar intake, diabetes, and cognitive impairment We discussed the Mediterranean diet, which has been shown to help patients reduce the risk of progressive memory disorders and reduces cardiovascular risk. This includes eating fish, eat fruits and green leafy vegetables, nuts like almonds and hazelnuts, walnuts, and also use olive oil. Avoid fast foods and fried foods as much as possible. Avoid sweets and sugar as sugar use has been linked to worsening of memory function.  There is always a concern of gradual progression of memory problems. If this is the case, then we may need to adjust level of care according to patient needs. Support, both to the patient and caregiver, should then be put into  place.    FALL PRECAUTIONS: Be cautious when walking. Scan the area for obstacles that may increase the risk of trips and falls. When getting up in the mornings, sit up at the edge of the bed for a few minutes before getting out of bed. Consider elevating the bed at the head end to avoid drop of blood pressure when getting up. Walk always in a well-lit room (use night lights in the walls). Avoid area rugs or power cords from appliances in the middle of the walkways. Use a walker or a cane if necessary and consider physical therapy for balance exercise. Get your eyesight checked regularly.  FINANCIAL OVERSIGHT: Supervision, especially oversight when making financial decisions or transactions is also recommended.  HOME SAFETY: Consider the safety of the kitchen when operating appliances like stoves, microwave oven, and blender. Consider having supervision and share cooking responsibilities until no longer able to participate in those. Accidents with firearms and other hazards in the house should be identified and addressed as well.   ABILITY TO BE LEFT ALONE: If patient is unable to contact 911 operator, consider using LifeLine, or when the need is there, arrange for someone to stay with patients. Smoking is a fire hazard, consider supervision or cessation. Risk of wandering should be assessed by caregiver and if detected at any point, supervision and safe proof recommendations should be instituted.  MEDICATION SUPERVISION: Inability to self-administer medication needs to be constantly addressed. Implement a mechanism to ensure safe administration of the medications.   DRIVING: Regarding driving, in patients  with progressive memory problems, driving will be impaired. We advise to have someone else do the driving if trouble finding directions or if minor accidents are reported. Independent driving assessment is available to determine safety of driving.   If you are interested in the driving  assessment, you can contact the following:  [employer] in [place] [phone]  [facility] [phone]  [facility] [phone]  [facility] [phone] or [phone]

## 2024-03-09 NOTE — Progress Notes (Signed)
 Assessment/Plan:   Dementia likely due to mixed vascular and Alzheimer's disease without behavioral disturbance   Timothy Gallagher is a very pleasant 88 y.o. RH male with a history of Grave's disease, B12, HOH, and a history of Dementia likely due to Alzheimer's and Vascular etiology seen today in follow up for memory loss. Patient is currently on donepezil  10 mg daily and memantine  10 mg twice daily.  Cognitive decline is noted.  He no longer drives.  He does not like participating in social activities discussed with his wife the importance of it, for example, attending  [facility] for [facility]. He has some sundowning episodes and moments of irritability. Discussed initiating Depakote , and wife and daughter agree to proceed.     Follow up in   months. Continue memantine  10 mg twice daily as prescribed, side effects discussed Continue donepezil  10 mg daily. Side effects were discussed  Start  Depakote  125 mg Take 1 tab at night , may increase to 1 tab twice a day for sundowning, hallucination, mood  Recommend using hearing aids to improve comprehension Recommend good control of her cardiovascular risk factors Recommend caregiver support sessions for wife and daughter.  Continue to control mood as per PCP    Subjective:    This patient is accompanied in the office by his wife who supplements the history.  Previous records as well as any outside records available were reviewed prior to todays visit. Patient was last seen on 09/09/2023 with MMSE 14/30   Any changes in memory since last visit? " Worse than prior".  He forgets within minutes "-wife says.  He has more difficulty remembering recent conversations, new information, names, although he denies it, shows poor insight into his condition.  "He just watches TV all day and the squirrels outside "-she says.  He likes going to [employer]. repeats oneself?  Endorsed, "over and over"-wife and daughter say Disoriented when walking  into a room?  Patient denies, but he may confuse one  room with another.   Leaving objects?  May misplace things sometimes for  hours, especially the glasses.   Wandering behavior?  Denies Any personality changes since last visit?  As before, he may be short tempered at times.  "No physical aggression but may let us  have it with words". Wife and daughter sometimes may feel embarrassed about his actions Any worsening depression?:  Denies.   Hallucinations or paranoia?  As before, he sees people walking down the hall, including dead family members and babies, but he denies these being scary to him.    Seizures? denies    Any sleep changes?  He sleeps well unless he has to get up to urinate.  He does talk at night during his sleep.  Denies vivid dreams, or sleepwalking   Sleep apnea?   Denies.   Any hygiene concerns?  Wife has to remind him to shower. Independent of bathing and dressing?  Endorsed  Does the patient needs help with medications?  Wife is in charge   Who is in charge of the finances?  Wife is in charge     Any changes in appetite?  He forgets that he ate and he eats again.       Patient have trouble swallowing? Denies.   Does the patient cook? No Any headaches?   denies   Chronic back pain he has arthritis with chronic right leg pain causing some mobility issues. Ambulates with difficulty? Denies.    Recent falls  or head injuries? denies     Unilateral weakness, numbness or tingling? Denies.   Any tremors?  Denies   Any anosmia?  Denies   Any incontinence of urine?  Endorsed . Recent lithotripsy.  Any bowel dysfunction?   Denies      Patient lives with his wife Does the patient drive? No longer drives. 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       Initial Visit 11/27/21 The patient is seen in neurologic consultation at the request of Almira Jaeger, [name]  The patient is accompanied by his wife who supplements the  history. This is a 88 y.o. year old RH  male who has had memory issues for about 2 years, although he denies any symptoms.  Apparently, he had been referred by PCP for evaluation in September 2021, but he never told his wife about it, and in today's visit, he adamantly refuses that he has any memory problems.  His wife reports that he is repeating the same stories and asking the same questions and this has been worse over the last 6 months.  Sometimes he does not remember where he has left objects.  He continues to drive only with his wife by his side, she denies that the patient is disoriented but she reports that he only drives short distances.  He denies having any irritability or mood changes, but his wife rolls her eyes after he states that he is "happy-go-lucky person".  Denies any depression.  He admits to not sleeping well because he has to get up around 3 times at night to urinate.  He denies any sleepwalking, REM behavior, hallucinations or paranoia.  He is independent of bathing and dressing, and his wife denies any hygiene concerns.  His medications are in a pillbox, but he forgets to take several doses.  His wife is in charge of the finances for the last 5 years.  His appetite is "too good "and denies trouble swallowing.  He does not cook.  He ambulates without difficulty, he has some chronic right leg pain due to arthritis, and is going to be seen by orthopedics soon.  He denies any falls or head injuries.  He does not use a cane to ambulate.  He denies a history of seizure, headaches, double vision, focal numbness or tingling, unilateral weakness, tremors or anosmia.  He has been evaluated for dizziness in view of orthostatic hypotension. He denies any constipation or diarrhea.  He denies a history of sleep apnea, alcohol or tobacco.  Family history negative for Alzheimer's disease.  He has high School education.  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  Last CT of the head on 12/31/2019 showed chronic small vessel ischemic changes.    MRI of the brain 12/19/21 No evidence of acute intracranial abnormality.3. Mild-to-moderate chronic small-vessel ischemic changes within the cerebral white matter.4. Subcentimeter chronic infarct within the superior right cerebellar hemisphere.5. Mild-to-moderate generalized cerebral atrophy.6. Comparatively mild cerebellar atrophy.   PREVIOUS MEDICATIONS:   CURRENT MEDICATIONS:  Outpatient Encounter Medications as of 03/09/2024  Medication Sig   aspirin  81 MG EC tablet Take 1 tablet (81 mg total) by mouth daily.   FLUoxetine  (PROZAC ) 20 MG capsule TAKE 1 CAPSULE(20 MG) BY MOUTH DAILY (Patient taking differently: Take 20 mg by mouth daily.)   methimazole  (TAPAZOLE ) 5  MG tablet Take 0.5 tablets (2.5 mg total) by mouth daily. (Patient taking differently: Take 2.5 mg by mouth every other day.)   rosuvastatin  (CRESTOR ) 40 MG tablet Take 1 tablet (40 mg total) by mouth daily. Dose increase 01/13/22 due to heart disease and LDL goal under 70   tamsulosin  (FLOMAX ) 0.4 MG CAPS capsule Take 1 capsule (0.4 mg total) by mouth 2 (two) times daily.   [DISCONTINUED] divalproex  (DEPAKOTE ) 125 MG DR tablet Take 1 tab at night , may increase to 1 tab twice a day if needed   [DISCONTINUED] donepezil  (ARICEPT ) 10 MG tablet Take 1 tablet (10 mg total) by mouth daily.   [DISCONTINUED] memantine  (NAMENDA ) 10 MG tablet Take 1 tablet  twice a day (Patient taking differently: Take 10 mg by mouth 2 (two) times daily. Take 1 tablet  twice a day)   divalproex  (DEPAKOTE ) 125 MG DR tablet Take 1 tab at night , may increase to 1 tab twice a day if needed   donepezil  (ARICEPT ) 10 MG tablet Take 1 tablet (10 mg total) by mouth daily.   memantine  (NAMENDA ) 10 MG tablet Take 1 tablet  twice a day   Multiple Vitamins-Minerals (CENTRUM SILVER PO) Take 1 tablet by mouth daily.   predniSONE  (DELTASONE )  20 MG tablet START tomorrow; Take 3 pills in the morning with breakfast for 1 day,  then 2 pills for 3 days, 1 pill for 2 days. (Patient not taking: Reported on 03/09/2024)   No facility-administered encounter medications on file as of 03/09/2024.       10/20/2023    2:07 PM 09/09/2023   10:00 AM 03/10/2023    9:00 AM  MMSE - Mini Mental State Exam  Not completed: Unable to complete    Orientation to time  1 2  Orientation to Place  2 3  Registration  3 3  Attention/ Calculation  0 0  Recall  0 1  Language- name 2 objects  2 2  Language- repeat  1 1  Language- follow 3 step command  3 3  Language- read & follow direction  1 1  Write a sentence  1 1  Copy design  0 0  Total score  14 17       No data to display          Objective:     PHYSICAL EXAMINATION:    VITALS:   Vitals:   03/09/24 1452  BP: (!) 121/43  Pulse: 88  Resp: 20  SpO2: 100%  Weight: 180 lb (81.6 kg)  Height: 6\' 2"  (1.88 m)    GEN:  The patient appears stated age and is in NAD. HEENT:  Normocephalic, atraumatic.   Neurological examination:  General: NAD, well-groomed, appears stated age. Orientation: The patient is alert. Oriented to person, place and  not to date Cranial nerves: There is good facial symmetry.The speech is fluent and clear. No aphasia or dysarthria. Fund of knowledge is reduced. Recent and remote memory are impaired. Attention and concentration are reduced.  Able to name objects and repeat phrases.  Hearing is decreased to conversational tone.  Sensation: Sensation is intact to light touch throughout Motor: Strength is at least antigravity x4. DTR's 2/4 in UE/LE     Movement examination: Tone: There is normal tone in the UE/LE Abnormal movements:  no tremor.  No myoclonus.  No asterixis.   Coordination:  There is some decremation with RAM's. Normal finger to nose  Gait and Station: The patient has  no difficulty arising out of a deep-seated chair without the use of the hands.  The patient's stride length is good.  Gait is cautious and narrow.    Thank you for allowing us  the opportunity to participate in the care of this nice patient. Please do not hesitate to contact us  for any questions or concerns.   Total time spent on today's visit was 40 minutes dedicated to this patient today, preparing to see patient, examining the patient, ordering tests and/or medications and counseling the patient, documenting clinical information in the EHR or other health record, independently interpreting results and communicating results to the patient/family, discussing treatment and goals, answering patient's questions and coordinating care.  Cc:  Almira Jaeger, MD  Tex Filbert 03/09/2024 5:25 PM

## 2024-03-12 ENCOUNTER — Other Ambulatory Visit: Payer: Self-pay | Admitting: Family Medicine

## 2024-03-16 ENCOUNTER — Ambulatory Visit: Payer: Self-pay

## 2024-03-16 ENCOUNTER — Ambulatory Visit (INDEPENDENT_AMBULATORY_CARE_PROVIDER_SITE_OTHER)

## 2024-03-16 DIAGNOSIS — E538 Deficiency of other specified B group vitamins: Secondary | ICD-10-CM | POA: Diagnosis not present

## 2024-03-16 MED ORDER — CYANOCOBALAMIN 1000 MCG/ML IJ SOLN
1000.0000 ug | Freq: Once | INTRAMUSCULAR | Status: AC
  Administered 2024-03-16: 1000 ug via INTRAMUSCULAR

## 2024-03-16 NOTE — Telephone Encounter (Signed)
 Patient does not need triage, Care team (neuro & PCP) recommended transition of care to a geriatric provider. Daughter calling to scheduled new patient appointment per recommendation.  Copied from CRM [id number]. Topic: Clinical - Red Word Triage >> Mar 16, 2024 10:22 AM Danelle Dunning F wrote: [facility] that prompted transfer to Nurse Triage:   Dizziness; Patient is in late stages of dementia; Patient takes a while to get his balance when he gets up from a seated position  Symptoms started: Months ago Reason for Disposition  Requesting regular office appointment  Answer Assessment - Initial Assessment Questions 1. REASON FOR CALL or QUESTION: "What is your reason for calling today?" or "How can I best help you?" or "What question do you have that I can help answer?"     New patient appointment  Protocols used: Information Only Call - No Triage-A-AH

## 2024-03-16 NOTE — Progress Notes (Signed)
 Patient is in office today for a nurse visit for B12 Injection. Patient Injection was given in the  Left deltoid. Patient tolerated injection well.

## 2024-03-21 ENCOUNTER — Ambulatory Visit: Admitting: Nurse Practitioner

## 2024-03-22 DIAGNOSIS — N201 Calculus of ureter: Secondary | ICD-10-CM | POA: Diagnosis not present

## 2024-03-23 ENCOUNTER — Telehealth: Payer: Self-pay

## 2024-03-23 NOTE — Telephone Encounter (Signed)
 I have sent this message to our referral coordinator to follow up on.  Copied from CRM [id number]. Topic: General - Other >> Mar 21, 2024  4:24 PM Jenice Mitts wrote: Reason for CRM: Patients wife calling in because he has an appointment with a  Verma Gobble, NP for his dementia. They are needing his information faxed over to [phone]. Needed by his appointment on may 9,2025

## 2024-03-25 ENCOUNTER — Ambulatory Visit: Admitting: Nurse Practitioner

## 2024-03-25 ENCOUNTER — Encounter: Payer: Self-pay | Admitting: Nurse Practitioner

## 2024-03-25 VITALS — BP 130/82 | HR 75 | Temp 98.0°F | Resp 21 | Ht 74.0 in | Wt 180.8 lb

## 2024-03-25 DIAGNOSIS — G8929 Other chronic pain: Secondary | ICD-10-CM | POA: Diagnosis not present

## 2024-03-25 DIAGNOSIS — R519 Headache, unspecified: Secondary | ICD-10-CM | POA: Diagnosis not present

## 2024-03-25 DIAGNOSIS — E538 Deficiency of other specified B group vitamins: Secondary | ICD-10-CM

## 2024-03-25 DIAGNOSIS — N401 Enlarged prostate with lower urinary tract symptoms: Secondary | ICD-10-CM

## 2024-03-25 DIAGNOSIS — I25810 Atherosclerosis of coronary artery bypass graft(s) without angina pectoris: Secondary | ICD-10-CM

## 2024-03-25 DIAGNOSIS — F01B3 Vascular dementia, moderate, with mood disturbance: Secondary | ICD-10-CM

## 2024-03-25 DIAGNOSIS — E785 Hyperlipidemia, unspecified: Secondary | ICD-10-CM | POA: Diagnosis not present

## 2024-03-25 DIAGNOSIS — N138 Other obstructive and reflux uropathy: Secondary | ICD-10-CM

## 2024-03-25 DIAGNOSIS — E0590 Thyrotoxicosis, unspecified without thyrotoxic crisis or storm: Secondary | ICD-10-CM

## 2024-03-25 DIAGNOSIS — F419 Anxiety disorder, unspecified: Secondary | ICD-10-CM

## 2024-03-25 MED ORDER — FLUOXETINE HCL 20 MG PO CAPS: 20.0000 mg | ORAL_CAPSULE | Freq: Every day | ORAL | Status: DC

## 2024-03-25 MED ORDER — METHIMAZOLE 5 MG PO TABS: 2.5000 mg | ORAL_TABLET | ORAL | Status: DC

## 2024-03-25 MED ORDER — TAMSULOSIN HCL 0.4 MG PO CAPS: 0.4000 mg | ORAL_CAPSULE | Freq: Every day | ORAL | Status: DC

## 2024-03-25 NOTE — Patient Instructions (Addendum)
 Start vit b12 1000 mcg daily   To increase depakote  to twice daily after 2 weeks if having issues to increase to 3 times daily

## 2024-03-25 NOTE — Assessment & Plan Note (Signed)
 Tolerating crestor , LDL at goal. Followed by cardiology

## 2024-03-25 NOTE — Assessment & Plan Note (Signed)
 Continues on prozac  due to anxiety, may benefit from additional medication to help control symptoms. Consider trazodone  next visit if sleep continues to be an issue

## 2024-03-25 NOTE — Assessment & Plan Note (Signed)
 Stable on flomax  daily at this time. Recommend taking in the evening.

## 2024-03-25 NOTE — Assessment & Plan Note (Signed)
 Stable without chest pains.

## 2024-03-25 NOTE — Progress Notes (Signed)
 Careteam: Patient Care Team: Verma Gobble, NP as PCP - General (Geriatric Medicine) Hugh Madura, MD as PCP - Cardiology (Cardiology) Erman Hayward, MD as Consulting Physician (Urology) Alto Atta, Scot Cutter, MD as Consulting Physician (Ophthalmology) Arleen Lacer, MD as Consulting Physician (Cardiology) Emory Harps (Neurology) Emilie Harden, MD as Consulting Physician (Internal Medicine)  PLACE OF SERVICE:  [facility]  Advanced Directive information    Allergies  Allergen Reactions   Lipitor  [Atorvastatin ] Other (See Comments)    REACTION: nausea and blurred vision   Trazodone  And Nefazodone Other (See Comments)    dizzy   Ciprofloxacin  Swelling   Mycophenolate Mofetil Other (See Comments)    REACTION: unspecified   Amoxicillin Rash   Penicillins Rash    Chief Complaint  Patient presents with   New Patient (Initial Visit)    HPI:  Discussed the use of AI scribe software for clinical note transcription with the patient, who gave verbal consent to proceed.  History of Present Illness Timothy Gallagher is an 88 year old male who presents for an established care appointment.  He has a history of coronary artery disease and underwent coronary artery bypass grafting in 2015. He takes aspirin  daily and has no chest pain. He continues to follow up with his cardiologist.  He experiences daily headaches over his eye and takes Tylenol  daily, though he is unsure if it helps. He also takes baby aspirin . He has a hx of dementia and a poor historian.   He is currently on Depakote  125 mg once daily, started two weeks ago for agitation and anger. He has not noticed any side effects and has not yet increased the dose to twice daily. He takes Aricept  and Namenda  for Dementia and Prozac  20 mg daily for mood. No depression, but he still experiences outbursts and appears to have increase in anxiety.  Per daughter does not like to let his wife out of his sight.   If she leaves the room he will yell for her.  Does not sleep well- very restless and has jerky, spasm and sees things   He is on Tapazole  for hyperthyroidism, taking it every other day.   He also takes Flomax  once daily for prostate issues.  He has a history of kidney stones and underwent lithotripsy on January 29, 2024. He was initially told to stop valsartan  after hospitalization for kidney stones, and he is not currently taking it.  Blood pressure has been low on valsartan  and was stopped after hospital. Reports increase in dizziness and has had multiple falls in the last 6 months.   He is very sedentary sits in the chair most of the day he mentions weak knees and legs.   He has a history of skin cancer but has not seen a dermatologist in several years.   He also has a history of hyperlipidemia, and diverticular disease.  He receives a B12 shot every four to five weeks for B12 deficiency, though his last level was high in February. He has not tried oral B12 supplements.   Family reports he craves salt and will add a lot to his food.    Review of Systems:  Review of Systems  Unable to perform ROS: Dementia    Past Medical History:  Diagnosis Date   ACNE ROSACEA 06/27/2009   ACTINIC KERATOSIS, HEAD 04/18/2009   Acute maxillary sinusitis 05/14/2010   ALLERGIC RHINITIS 04/09/2007   Anal fissure 04/07/2016   B12 DEFICIENCY 06/07/2007  BACK PAIN WITH RADICULOPATHY 04/24/2008   Bilateral nephrolithiasis 07/07/2022   CT abd 06/2022 1. Nonobstructive bilateral nephrolithiasis measuring up to 5 mm on the right and 6 mm on the left.   Cancer [facility])    skin   CHEST WALL PAIN, ACUTE 06/15/2009   Chronic maxillary sinusitis 05/29/2008   COLITIS 04/27/2009   COLONIC POLYPS, HX OF 04/27/2009   tubular adenomas   Coronary artery disease 05/12/2014   Cath 05/12/2014 w/ severe 3-vessel CAD and preserved LV function, EF 55%   DERMATITIS, ATOPIC 10/12/2007   Diverticular disease 07/07/2022    CT abd 06/2022 . Colonic diverticulosis with no acute diverticulitis.   DIVERTICULOSIS, COLON 04/27/2009   ECCHYMOSES, SPONTANEOUS 06/27/2008   Elevated sedimentation rate 05/02/2009   ESOPHAGEAL STRICTURE 04/27/2009   GASTRITIS, CHRONIC 04/27/2009   HYPERLIPIDEMIA 03/13/2008   HYPERTENSION 04/09/2007   Hyperthyroidism    Iliac aneurysm (HCC) 07/07/2022   CT abd 06/2022 . Aneurysmal left common iliac artery (1.6 cm).     Internal bleeding hemorrhoids 01/22/2015   01/22/2015 Seen at anoscopy, grade 1 all 3 positions    Irritable bowel syndrome 08/11/2007   KIDNEY DISEASE 04/09/2007   NECK PAIN 09/14/2008   NEUROPATHY, IDIOPATHIC PERIPHERAL NEC 08/11/2007   OSTEOARTHRITIS, WRIST, RIGHT 08/05/2010   Postoperative delirium 05/20/2014   S/P CABG x 4 05/19/2014   LIMA to LAD, SVG to diag, SVG to OM, SVG to PDA, EVH via right thigh and leg   Past Surgical History:  Procedure Laterality Date   CARDIAC CATHETERIZATION     COLONOSCOPY     CORONARY ARTERY BYPASS GRAFT N/A 05/19/2014   Procedure: CORONARY ARTERY BYPASS GRAFTING (CABG);  Surgeon: Gardenia Jump, MD;  Location: [facility] OR;  Service: Open Heart Surgery;  Laterality: N/A;  Times 4 using left internal mammary artery and endoscopically harvested right saphenous vein   ESOPHAGOGASTRODUODENOSCOPY     EXTRACORPOREAL SHOCK WAVE LITHOTRIPSY Right 01/29/2024   Procedure: LITHOTRIPSY, ESWL;  Surgeon: Mallie Seal, MD;  Location: [facility] ORS;  Service: Urology;  Laterality: Right;   FINGER SURGERY     cut off end of finger   FLEXIBLE SIGMOIDOSCOPY     HEMORRHOID BANDING     HERNIA REPAIR     INCISION AND DRAINAGE WOUND WITH FOREIGN BODY REMOVAL Left 12/20/2013   Procedure: INCISION AND DRAINAGE LEFT INDEX FINGER;  Surgeon: Milagros Alf, MD;  Location: [facility] ORS;  Service: Orthopedics;  Laterality: Left;   INTRAOPERATIVE TRANSESOPHAGEAL ECHOCARDIOGRAM N/A 05/19/2014   Procedure: INTRAOPERATIVE TRANSESOPHAGEAL ECHOCARDIOGRAM;  Surgeon: Gardenia Jump,  MD;  Location: [facility] OR;  Service: Open Heart Surgery;  Laterality: N/A;   lamenectomy     LEFT HEART CATHETERIZATION WITH CORONARY ANGIOGRAM N/A 05/12/2014   Procedure: LEFT HEART CATHETERIZATION WITH CORONARY ANGIOGRAM;  Surgeon: Mickiel Albany, MD;  Location: [facility] CATH LAB;  Service: Cardiovascular;  Laterality: N/A;   LUMBAR FUSION     TONSILLECTOMY     VARICOSE VEIN SURGERY Left    Social History:   reports that he quit smoking about 25 years ago. His smoking use included cigarettes. He started smoking about 67 years ago. He has a 21 pack-year smoking history. He has never used smokeless tobacco. He reports that he does not drink alcohol and does not use drugs.  Family History  Problem Relation Age of Onset   Hernia Mother    Heart disease Father        smoker   COPD Father    Heart  attack Father    Heart attack Brother    Early death Daughter    Colon cancer Neg Hx    Esophageal cancer Neg Hx    Pancreatic cancer Neg Hx     Medications: Patient's Medications  New Prescriptions   No medications on file  Previous Medications   ACETAMINOPHEN  (TYLENOL ) 500 MG TABLET    Take 500 mg by mouth every 6 (six) hours as needed.   ASPIRIN  81 MG EC TABLET    Take 1 tablet (81 mg total) by mouth daily.   DIVALPROEX  (DEPAKOTE ) 125 MG DR TABLET    Take 1 tab at night , may increase to 1 tab twice a day if needed   DONEPEZIL  (ARICEPT ) 10 MG TABLET    Take 1 tablet (10 mg total) by mouth daily.   MEMANTINE  (NAMENDA ) 10 MG TABLET    Take 1 tablet  twice a day   MULTIPLE VITAMINS-MINERALS (CENTRUM SILVER PO)    Take 1 tablet by mouth daily.   ROSUVASTATIN  (CRESTOR ) 40 MG TABLET    TAKE 1 TABLET(40 MG) BY MOUTH DAILY  Modified Medications   Modified Medication Previous Medication   FLUOXETINE  (PROZAC ) 20 MG CAPSULE FLUoxetine  (PROZAC ) 20 MG capsule      Take 1 capsule (20 mg total) by mouth daily.    TAKE 1 CAPSULE(20 MG) BY MOUTH DAILY   METHIMAZOLE  (TAPAZOLE ) 5 MG TABLET methimazole   (TAPAZOLE ) 5 MG tablet      Take 0.5 tablets (2.5 mg total) by mouth every other day.    Take 0.5 tablets (2.5 mg total) by mouth daily.   TAMSULOSIN  (FLOMAX ) 0.4 MG CAPS CAPSULE tamsulosin  (FLOMAX ) 0.4 MG CAPS capsule      Take 1 capsule (0.4 mg total) by mouth daily.    Take 1 capsule (0.4 mg total) by mouth 2 (two) times daily.  Discontinued Medications   PREDNISONE  (DELTASONE ) 20 MG TABLET    START tomorrow; Take 3 pills in the morning with breakfast for 1 day,  then 2 pills for 3 days, 1 pill for 2 days.    Physical Exam:  Vitals:   03/25/24 0941 03/25/24 1027  BP: (!) 146/80 130/82  Pulse: 75   Resp: (!) 21   Temp: 98 F (36.7 C)   SpO2: 98%   Weight: 180 lb 12.8 oz (82 kg)   Height: 6\' 2"  (1.88 m)    Body mass index is 23.21 kg/m. Wt Readings from Last 3 Encounters:  03/25/24 180 lb 12.8 oz (82 kg)  03/09/24 180 lb (81.6 kg)  02/01/24 180 lb 6.4 oz (81.8 kg)    Physical Exam Constitutional:      General: He is not in acute distress.    Appearance: He is well-developed. He is not diaphoretic.     Comments: Pleasant talkative and confused during visit  HENT:     Head: Normocephalic and atraumatic.     Right Ear: External ear normal.     Left Ear: External ear normal.     Mouth/Throat:     Pharynx: No oropharyngeal exudate.  Eyes:     Conjunctiva/sclera: Conjunctivae normal.     Pupils: Pupils are equal, round, and reactive to light.  Cardiovascular:     Rate and Rhythm: Normal rate and regular rhythm.     Heart sounds: Normal heart sounds.  Pulmonary:     Effort: Pulmonary effort is normal.     Breath sounds: Normal breath sounds.  Abdominal:     General:  Bowel sounds are normal.     Palpations: Abdomen is soft.  Musculoskeletal:        General: No tenderness.     Cervical back: Normal range of motion and neck supple.     Right lower leg: No edema.     Left lower leg: No edema.  Skin:    General: Skin is warm and dry.  Neurological:     Mental Status:  He is alert and oriented to person, place, and time.     Labs reviewed: Basic Metabolic Panel: Recent Labs    07/02/23 1207 09/04/23 1052 01/12/24 1056 01/25/24 1149 01/26/24 0444 01/27/24 0514  NA 140  --  141 140 137 137  K 4.6  --  4.0 4.1 4.3 4.1  CL 104  --  104 106 110 107  CO2 29  --  30 24 21* 19*  GLUCOSE 86  --  93 107* 101* 95  BUN 16  --  16 25* 22 18  CREATININE 0.97  --  0.94 1.85* 1.61* 1.73*  CALCIUM  9.0  --  8.7 8.9 8.2* 8.2*  TSH 2.83 1.52 1.79  --   --   --    Liver Function Tests: Recent Labs    07/02/23 1207 01/12/24 1056 01/25/24 1149  AST 22 19 37  ALT 17 11 22   ALKPHOS 69 72 65  BILITOT 0.4 0.5 0.9  PROT 6.5 6.6 7.3  ALBUMIN  3.8 3.7 3.5   Recent Labs    01/25/24 1149  LIPASE 32   No results for input(s): "AMMONIA" in the last 8760 hours. CBC: Recent Labs    07/02/23 1207 01/12/24 1056 01/25/24 1149 01/26/24 0444  WBC 6.0 5.3 7.5 6.8  NEUTROABS 3.4 3.1  --   --   HGB 12.6* 12.3* 12.6* 11.2*  HCT 39.7 38.6* 41.2 35.7*  MCV 87.0 85.9 89.4 87.9  PLT 163.0 179.0 170 146*   Lipid Panel: Recent Labs    07/02/23 1207 01/12/24 1056  CHOL 104 115  HDL 44.80 53.10  LDLCALC 35 46  TRIG 121.0 78.0  CHOLHDL 2 2   TSH: Recent Labs    07/02/23 1207 09/04/23 1052 01/12/24 1056  TSH 2.83 1.52 1.79   A1C: Lab Results  Component Value Date   HGBA1C 6.4 01/12/2024     Assessment/Plan  Chronic nonintractable headache, unspecified headache type Assessment & Plan: Ongoing, uses tylenol  which does help symptoms.    Hyperlipidemia, unspecified hyperlipidemia type Assessment & Plan: Tolerating crestor , LDL at goal. Followed by cardiology   Orders: -     COMPLETE METABOLIC PANEL WITHOUT GFR  Coronary artery disease involving coronary bypass graft of native heart without angina pectoris Assessment & Plan: Stable without chest pains.   Orders: -     CBC with Differential/Platelet -     COMPLETE METABOLIC PANEL WITHOUT  GFR  Anxiety Assessment & Plan: Continues on prozac  due to anxiety, may benefit from additional medication to help control symptoms. Consider trazodone  next visit if sleep continues to be an issue  Orders: -     FLUoxetine  HCl; Take 1 capsule (20 mg total) by mouth daily.  Hyperthyroidism Assessment & Plan: TSH at goal on tapazole . Taking every other day.   Orders: -     methIMAzole ; Take 0.5 tablets (2.5 mg total) by mouth every other day. -     TSH  BPH with obstruction/lower urinary tract symptoms Assessment & Plan: Stable on flomax  daily at this time. Recommend taking in  the evening.  Orders: -     Tamsulosin  HCl; Take 1 capsule (0.4 mg total) by mouth daily.  Vitamin B12 deficiency Assessment & Plan: Wife has a hard time getting him to appts monthly, will have start vit b12 1000 mcg daily, will recheck b12 with next visit    Moderate mixed vascular and neurodegenerative dementia with mood disturbance (HCC) Assessment & Plan: Progressive decline noted with increase in behaviors to increase depakote  to twice daily, if ongoing behaviors after 1 week to increase to three times dialy   Orders: -     AMB Referral VBCI Care Management     Return in about 3 months (around 06/25/2024) for routine follow up.  Beva Remund K. Denney Fisherman [facility] [phone]

## 2024-03-25 NOTE — Assessment & Plan Note (Signed)
 Ongoing, uses tylenol  which does help symptoms.

## 2024-03-25 NOTE — Assessment & Plan Note (Signed)
 Progressive decline noted with increase in behaviors to increase depakote  to twice daily, if ongoing behaviors after 1 week to increase to three times dialy

## 2024-03-25 NOTE — Assessment & Plan Note (Signed)
 Wife has a hard time getting him to appts monthly, will have start vit b12 1000 mcg daily, will recheck b12 with next visit

## 2024-03-25 NOTE — Assessment & Plan Note (Signed)
 TSH at goal on tapazole . Taking every other day.

## 2024-03-26 LAB — CBC WITH DIFFERENTIAL/PLATELET
Absolute Lymphocytes: 1884 {cells}/uL (ref 850–3900)
Absolute Monocytes: 510 {cells}/uL (ref 200–950)
Basophils Absolute: 38 {cells}/uL (ref 0–200)
Basophils Relative: 0.6 %
Eosinophils Absolute: 271 {cells}/uL (ref 15–500)
Eosinophils Relative: 4.3 %
HCT: 35.6 % — ABNORMAL LOW (ref 38.5–50.0)
Hemoglobin: 11.4 g/dL — ABNORMAL LOW (ref 13.2–17.1)
MCH: 27.9 pg (ref 27.0–33.0)
MCHC: 32 g/dL (ref 32.0–36.0)
MCV: 87.3 fL (ref 80.0–100.0)
MPV: 10.7 fL (ref 7.5–12.5)
Monocytes Relative: 8.1 %
Neutro Abs: 3597 {cells}/uL (ref 1500–7800)
Neutrophils Relative %: 57.1 %
Platelets: 188 10*3/uL (ref 140–400)
RBC: 4.08 10*6/uL — ABNORMAL LOW (ref 4.20–5.80)
RDW: 14.7 % (ref 11.0–15.0)
Total Lymphocyte: 29.9 %
WBC: 6.3 10*3/uL (ref 3.8–10.8)

## 2024-03-26 LAB — COMPLETE METABOLIC PANEL WITHOUT GFR
AG Ratio: 1.3 (calc) (ref 1.0–2.5)
ALT: 10 U/L (ref 9–46)
AST: 18 U/L (ref 10–35)
Albumin: 3.6 g/dL (ref 3.6–5.1)
Alkaline phosphatase (APISO): 67 U/L (ref 35–144)
BUN: 11 mg/dL (ref 7–25)
CO2: 29 mmol/L (ref 20–32)
Calcium: 8.8 mg/dL (ref 8.6–10.3)
Chloride: 106 mmol/L (ref 98–110)
Creat: 0.76 mg/dL (ref 0.70–1.22)
Globulin: 2.7 g/dL (ref 1.9–3.7)
Glucose, Bld: 91 mg/dL (ref 65–139)
Potassium: 4.6 mmol/L (ref 3.5–5.3)
Sodium: 142 mmol/L (ref 135–146)
Total Bilirubin: 0.5 mg/dL (ref 0.2–1.2)
Total Protein: 6.3 g/dL (ref 6.1–8.1)

## 2024-03-26 LAB — TSH: TSH: 2.34 m[IU]/L (ref 0.40–4.50)

## 2024-03-28 ENCOUNTER — Ambulatory Visit: Admitting: Nurse Practitioner

## 2024-03-28 ENCOUNTER — Encounter: Payer: Self-pay | Admitting: Nurse Practitioner

## 2024-03-29 ENCOUNTER — Ambulatory Visit: Payer: Self-pay

## 2024-03-29 NOTE — Telephone Encounter (Signed)
Message routed to PCP Eubanks, Jessica K, NP  

## 2024-04-01 ENCOUNTER — Ambulatory Visit: Payer: Self-pay

## 2024-04-01 ENCOUNTER — Telehealth: Payer: Self-pay | Admitting: Nurse Practitioner

## 2024-04-01 ENCOUNTER — Other Ambulatory Visit: Payer: Self-pay | Admitting: Nurse Practitioner

## 2024-04-01 DIAGNOSIS — N401 Enlarged prostate with lower urinary tract symptoms: Secondary | ICD-10-CM

## 2024-04-01 NOTE — Telephone Encounter (Signed)
 This RN called the [facility] to advise them of patient's symptoms and probable ER Refusal per his wife.

## 2024-04-01 NOTE — Telephone Encounter (Unsigned)
 Copied from CRM [id number]. Topic: Clinical - Medication Refill >> Apr 01, 2024  9:17 AM Tisa Forester wrote: Medication:  Disp Refills Start End  tamsulosin  (FLOMAX ) 0.4 MG CAPS capsule       Has the patient contacted their pharmacy? Yes , have call the provider to call in  (Agent: If no, request that the patient contact the pharmacy for the refill. If patient does not wish to contact the pharmacy document the reason why and proceed with request.) (Agent: If yes, when and what did the pharmacy advise?)  This is the patient's preferred pharmacy:  [id number] Jonette Nestle, [place] - [address] AT [facility] OF [facility] & [address] [place] [address] Phone: [phone] Fax: [phone]  Is this the correct pharmacy for this prescription? Yes If no, delete pharmacy and type the correct one.   Has the prescription been filled recently? No  Is the patient out of the medication? Yes  , took last one this morning   Has the patient been seen for an appointment in the last year OR does the patient have an upcoming appointment? Yes  Can we respond through MyChart? No  Agent: Please be advised that Rx refills may take up to 3 business days. We ask that you follow-up with your pharmacy.

## 2024-04-01 NOTE — Telephone Encounter (Signed)
 If he has had a fall where he has hit his head twice would recommend going to the ED, also if they have recently increase depakote  this could be contributing to dizziness. What is the dose and frequency he is getting this now.

## 2024-04-01 NOTE — Telephone Encounter (Signed)
 Patient wife states that he's NOT going to the ED. She states that you told her to start off with 1 tablet and now he's taking 2 tablets daily, and the day before yesterday he actually took 3 tablets. Patient wife states that maybe they need to go back to 1 tablet. Message routed back to PCP Roselie Conger, Champ Coma, NP

## 2024-04-01 NOTE — Telephone Encounter (Signed)
 Copied from CRM [id number]. Topic: Clinical - Red Word Triage >> Apr 01, 2024  9:21 AM Tisa Forester wrote: Red Word that prompted transfer to Nurse Triage: Timothy Gallagher (wife) called on behalf of patient stated be dizzy all the time and his providers said it may be his medications making him dizzy because he on different medications , however spouse Timothy Gallagher stated patient is more dizzy today than normally , he falling over   Patient call back [phone]   Chief Complaint: Dizziness Symptoms: dizziness Frequency: several months this severe this morning Pertinent Negatives: Patient denies nausea, vomiting, diarrhea, fever Disposition: [] ED /[] Urgent Care (no appt availability in office) / [] Appointment(In office/virtual)/ []  [place] [facility]/ [] Home Care/ [x] Refused Recommended Disposition /[] [facility]/ []  Follow-up with PCP Additional Notes:  Patient's wife states that his morning the patient's dizziness is a lot more severe than normal. While on the phone with this RN, the patient in the background got up and wanted to go outside to the trash can and the patient's wife told him multiple times to sit back down because he almost fell while trying to get up at that point. She denies the patient having any nausea, vomiting, diarrhea, or fevers.  One provider, Dr Arlene Ben,  advised them that his medications may be making him dizzy.  Patient fell 3 weeks ago and hit his head.  Patient was not assessed for this.  Patient also fell in the kitchen about three days after that--he hit his head at that time as well.  Patient's wife states that the patient went to the Emergency Room about a month ago and had to wait many hours then had to stay in the hospital for two days. She states that the Emergency Room is out of the question unless something happens. She is advised with his severe dizziness--much worse than normal and having two recent falls involving hitting his head these are reasons for  the Emergency Room to be recommended. She states that she will tell him but he still probably won't go. She is also advised at this time that if anything major changes to call 911 for an ambulance. Patient's wife verbalized understanding.  Reason for Disposition  SEVERE dizziness (e.g., unable to stand, requires support to walk, feels like passing out now)  Answer Assessment - Initial Assessment Questions 1. DESCRIPTION: "Describe your dizziness."      2. LIGHTHEADED: "Do you feel lightheaded?" (e.g., somewhat faint, woozy, weak upon standing)     Wife isnt sure 3. VERTIGO: "Do you feel like either you or the room is spinning or tilting?" (i.e. vertigo)     Wife isnt sure 4. SEVERITY: "How bad is it?"  "Do you feel like you are going to faint?" "Can you stand and walk?"   - MILD: Feels slightly dizzy, but walking normally.   - MODERATE: Feels unsteady when walking, but not falling; interferes with normal activities (e.g., school, work).   - SEVERE: Unable to walk without falling, or requires assistance to walk without falling; feels like passing out now.      Wife states he can't stand without almost falling over 5. ONSET:  "When did the dizziness begin?"     Several months but a lot worse this morning 6. AGGRAVATING FACTORS: "Does anything make it worse?" (e.g., standing, change in head position)     ---- 7. HEART RATE: "Can you tell me your heart rate?" "How many beats in 15 seconds?"  (Note: not all patients can  do this)       ---- 8. CAUSE: "What do you think is causing the dizziness?"     Unsure---maybe medications 9. RECURRENT SYMPTOM: "Have you had dizziness before?" If Yes, ask: "When was the last time?" "What happened that time?"     ---- 10. OTHER SYMPTOMS: "Do you have any other symptoms?" (e.g., fever, chest pain, vomiting, diarrhea, bleeding)       Patient's wife denies  Protocols used: Dizziness - Lightheadedness-A-AH

## 2024-04-01 NOTE — Telephone Encounter (Signed)
Message routed to PCP Eubanks, Jessica K, NP  

## 2024-04-01 NOTE — Telephone Encounter (Signed)
 Completed by office today

## 2024-04-02 NOTE — Telephone Encounter (Signed)
 Agree with going back to one tablet and see if this improves dizziness, lets get him into the office for a follow up as well

## 2024-04-04 ENCOUNTER — Telehealth: Payer: Self-pay | Admitting: *Deleted

## 2024-04-04 NOTE — Progress Notes (Signed)
 Complex Care Management Note  Care Guide Note 04/04/2024 Name: LENARDO WESTWOOD MRN: [id number] DOB: 1935-12-07  Madison Schimke is a 88 y.o. year old male who sees Eubanks, Champ Coma, NP for primary care. I reached out to [employer] by phone today to offer complex care management services.  Mr. Ramsburg was given information about Complex Care Management services today including:   The Complex Care Management services include support from the care team which includes your Nurse Care Manager, Clinical Social Worker, or Pharmacist.  The Complex Care Management team is here to help remove barriers to the health concerns and goals most important to you. Complex Care Management services are voluntary, and the patient may decline or stop services at any time by request to their care team member.   Complex Care Management Consent Status: Patient agreed to services and verbal consent obtained.   Follow up plan:  Telephone appointment with complex care management team member scheduled for:  04/18/24  Encounter Outcome:  Patient Scheduled  Barnie Bora  [facility]  [facility] Guide  Direct Dial: [phone]  Fax [phone]

## 2024-04-04 NOTE — Telephone Encounter (Signed)
 Spoke with wife and she stated that patient's symptoms have gotten better. She scheduled an appointment for 05/02/2024 for evaluation. Stated that she will call back to schedule a sooner appointment if new or worsening symptoms.

## 2024-04-05 DIAGNOSIS — N201 Calculus of ureter: Secondary | ICD-10-CM | POA: Diagnosis not present

## 2024-04-18 ENCOUNTER — Other Ambulatory Visit: Payer: Self-pay | Admitting: Physician Assistant

## 2024-04-18 ENCOUNTER — Telehealth: Payer: Self-pay | Admitting: Licensed Clinical Social Worker

## 2024-04-18 ENCOUNTER — Telehealth: Payer: Self-pay | Admitting: Physician Assistant

## 2024-04-18 ENCOUNTER — Other Ambulatory Visit: Payer: Self-pay | Admitting: Urology

## 2024-04-18 MED ORDER — DIVALPROEX SODIUM 125 MG PO DR TAB: DELAYED_RELEASE_TABLET | ORAL | 3 refills | Status: DC

## 2024-04-18 NOTE — Telephone Encounter (Signed)
 Pt wife said he is out of his divalproex  (DEPAKOTE ) 125 MG DR tablet [[id number]]   He is taking 1 in the morning and 1 at night. He is only getting 60 pills. She said sara told her he cold take 3 if needed. He is out and don't have nay to take this AM.   Pharmacy- [facility] [address]

## 2024-04-18 NOTE — Progress Notes (Signed)
 Sent message, via epic in basket, requesting orders in epic from Careers adviser.

## 2024-04-21 NOTE — Patient Instructions (Signed)
 SURGICAL WAITING ROOM VISITATION  Patients having surgery or a procedure may have no more than 2 support people in the waiting area - these visitors may rotate.    Children under the age of 2 must have an adult with them who is not the patient.  Visitors with respiratory illnesses are discouraged from visiting and should remain at home.  If the patient needs to stay at the hospital during part of their recovery, the visitor guidelines for inpatient rooms apply. Pre-op nurse will coordinate an appropriate time for 1 support person to accompany patient in pre-op.  This support person may not rotate.    Please refer to the [facility] website for the visitor guidelines for Inpatients (after your surgery is over and you are in a regular room).       Your procedure is scheduled on: 04/29/24   Report to [facility] Main Entrance    Report to admitting at : 7:45 AM   Call this number if you have problems the morning of surgery [phone]   Do not eat food or drink : After Midnight.   FOLLOW ANY ADDITIONAL PRE OP INSTRUCTIONS YOU RECEIVED FROM YOUR SURGEON'S OFFICE!!!   Oral Hygiene is also important to reduce your risk of infection.                                    Remember - BRUSH YOUR TEETH THE MORNING OF SURGERY WITH YOUR REGULAR TOOTHPASTE  DENTURES WILL BE REMOVED PRIOR TO SURGERY PLEASE DO NOT APPLY "Poly grip" OR ADHESIVES!!!   Do NOT smoke after Midnight   Stop all vitamins and herbal supplements 7 days before surgery.   Take these medicines the morning of surgery with A SIP OF WATER: Depakote ,Prozac ,tamsulosin ,donepezil ,memotine.Tylenol  as needed.                              You may not have any metal on your body including hair pins, jewelry, and body piercing             Do not wear lotions, powders, perfumes/cologne, or deodorant              Men may shave face and neck.   Do not bring valuables to the hospital. [facility] IS NOT             RESPONSIBLE    FOR VALUABLES.   Contacts, glasses, dentures or bridgework may not be worn into surgery.   Bring small overnight bag day of surgery.   DO NOT BRING YOUR HOME MEDICATIONS TO THE HOSPITAL. PHARMACY WILL DISPENSE MEDICATIONS LISTED ON YOUR MEDICATION LIST TO YOU DURING YOUR ADMISSION IN THE HOSPITAL!    Patients discharged on the day of surgery will not be allowed to drive home.  Someone NEEDS to stay with you for the first 24 hours after anesthesia.   Special Instructions: Bring a copy of your healthcare power of attorney and living will documents the day of surgery if you haven't scanned them before.              Please read over the following fact sheets you were given: IF YOU HAVE QUESTIONS ABOUT YOUR PRE-OP INSTRUCTIONS PLEASE CALL [phone]   If you received a COVID test during your pre-op visit  it is requested that you wear a mask when out in public, stay  away from anyone that may not be feeling well and notify your surgeon if you develop symptoms. If you test positive for Covid or have been in contact with anyone that has tested positive in the last 10 days please notify you surgeon.    [facility] - Preparing for Surgery Before surgery, you can play an important role.  Because skin is not sterile, your skin needs to be as free of germs as possible.  You can reduce the number of germs on your skin by washing with CHG (chlorahexidine gluconate) soap before surgery.  CHG is an antiseptic cleaner which kills germs and bonds with the skin to continue killing germs even after washing. Please DO NOT use if you have an allergy to CHG or antibacterial soaps.  If your skin becomes reddened/irritated stop using the CHG and inform your nurse when you arrive at Short Stay. Do not shave (including legs and underarms) for at least 48 hours prior to the first CHG shower.  You may shave your face/neck. Please follow these instructions carefully:  1.  Shower with CHG Soap the night before surgery  and the  morning of Surgery.  2.  If you choose to wash your hair, wash your hair first as usual with your  normal  shampoo.  3.  After you shampoo, rinse your hair and body thoroughly to remove the  shampoo.                           4.  Use CHG as you would any other liquid soap.  You can apply chg directly  to the skin and wash                       Gently with a scrungie or clean washcloth.  5.  Apply the CHG Soap to your body ONLY FROM THE NECK DOWN.   Do not use on face/ open                           Wound or open sores. Avoid contact with eyes, ears mouth and genitals (private parts).                       Wash face,  Genitals (private parts) with your normal soap.             6.  Wash thoroughly, paying special attention to the area where your surgery  will be performed.  7.  Thoroughly rinse your body with warm water from the neck down.  8.  DO NOT shower/wash with your normal soap after using and rinsing off  the CHG Soap.                9.  Pat yourself dry with a clean towel.            10.  Wear clean pajamas.            11.  Place clean sheets on your bed the night of your first shower and do not  sleep with pets. Day of Surgery : Do not apply any lotions/deodorants the morning of surgery.  Please wear clean clothes to the hospital/surgery center.  FAILURE TO FOLLOW THESE INSTRUCTIONS MAY RESULT IN THE CANCELLATION OF YOUR SURGERY PATIENT SIGNATURE_________________________________  NURSE SIGNATURE__________________________________  ________________________________________________________________________

## 2024-04-22 ENCOUNTER — Telehealth: Payer: Self-pay

## 2024-04-22 NOTE — Progress Notes (Signed)
 Complex Care Management Note  Care Guide Note 04/22/2024 Name: JOVAHN BREIT MRN: [id number] DOB: [birth year]  Madison Schimke is a 88 y.o. year old male who sees Eubanks, Champ Coma, NP for primary care. I reached out to [employer] by phone today to offer complex care management services.  Mr. Beegle was given information about Complex Care Management services today including:   The Complex Care Management services include support from the care team which includes your Nurse Care Manager, Clinical Social Worker, or Pharmacist.  The Complex Care Management team is here to help remove barriers to the health concerns and goals most important to you. Complex Care Management services are voluntary, and the patient may decline or stop services at any time by request to their care team member.   Complex Care Management Consent Status: Patient agreed to services and verbal consent obtained.   Follow up plan:  Telephone appointment with complex care management team member scheduled for:  05-09-24  Encounter Outcome:  Patient Scheduled  Creola Doheny [facility], [facility] Guide  Direct Dial: [phone]  Fax [phone]

## 2024-04-22 NOTE — Progress Notes (Signed)
 Complex Care Management Care Guide Note  04/22/2024 Name: Timothy Gallagher MRN: [id number] DOB: May 10, 1936  Timothy Gallagher is a 88 y.o. year old male who is a primary care patient of Verma Gobble, NP and is actively engaged with the care management team. I reached out to Timothy Gallagher by phone today to assist with re-scheduling  with the Licensed Clinical Child psychotherapist.  Follow up plan: Unsuccessful telephone outreach attempt made. A HIPAA compliant phone message was left for the patient providing contact information and requesting a return call.  Creola Doheny [facility], [facility] Guide  Direct Dial: [phone]  Fax [phone]

## 2024-04-25 ENCOUNTER — Encounter (HOSPITAL_COMMUNITY): Payer: Self-pay

## 2024-04-25 ENCOUNTER — Encounter (HOSPITAL_COMMUNITY)
Admission: RE | Admit: 2024-04-25 | Discharge: 2024-04-25 | Disposition: A | Discharge status: Home / Self Care | Source: Ambulatory Visit | Attending: Urology | Admitting: Urology

## 2024-04-25 ENCOUNTER — Other Ambulatory Visit: Payer: Self-pay

## 2024-04-25 VITALS — BP 126/70 | HR 82 | Temp 98.1°F | Ht 74.0 in | Wt 176.0 lb

## 2024-04-25 DIAGNOSIS — Z01812 Encounter for preprocedural laboratory examination: Secondary | ICD-10-CM | POA: Diagnosis not present

## 2024-04-25 DIAGNOSIS — I10 Essential (primary) hypertension: Secondary | ICD-10-CM | POA: Insufficient documentation

## 2024-04-25 DIAGNOSIS — E0590 Thyrotoxicosis, unspecified without thyrotoxic crisis or storm: Secondary | ICD-10-CM | POA: Diagnosis not present

## 2024-04-25 DIAGNOSIS — N201 Calculus of ureter: Secondary | ICD-10-CM | POA: Insufficient documentation

## 2024-04-25 DIAGNOSIS — I714 Abdominal aortic aneurysm, without rupture, unspecified: Secondary | ICD-10-CM | POA: Diagnosis not present

## 2024-04-25 DIAGNOSIS — Z87891 Personal history of nicotine dependence: Secondary | ICD-10-CM | POA: Insufficient documentation

## 2024-04-25 DIAGNOSIS — Z951 Presence of aortocoronary bypass graft: Secondary | ICD-10-CM | POA: Diagnosis not present

## 2024-04-25 DIAGNOSIS — I2510 Atherosclerotic heart disease of native coronary artery without angina pectoris: Secondary | ICD-10-CM | POA: Diagnosis not present

## 2024-04-25 HISTORY — DX: Personal history of urinary calculi: Z87.442

## 2024-04-25 LAB — CBC
HCT: 39.6 % (ref 39.0–52.0)
Hemoglobin: 12.1 g/dL — ABNORMAL LOW (ref 13.0–17.0)
MCH: 27.3 pg (ref 26.0–34.0)
MCHC: 30.6 g/dL (ref 30.0–36.0)
MCV: 89.2 fL (ref 80.0–100.0)
Platelets: 147 10*3/uL — ABNORMAL LOW (ref 150–400)
RBC: 4.44 MIL/uL (ref 4.22–5.81)
RDW: 15 % (ref 11.5–15.5)
WBC: 6.9 10*3/uL (ref 4.0–10.5)
nRBC: 0 % (ref 0.0–0.2)

## 2024-04-25 LAB — BASIC METABOLIC PANEL WITH GFR
Anion gap: 10 (ref 5–15)
BUN: 16 mg/dL (ref 8–23)
CO2: 24 mmol/L (ref 22–32)
Calcium: 8.9 mg/dL (ref 8.9–10.3)
Chloride: 106 mmol/L (ref 98–111)
Creatinine, Ser: 0.79 mg/dL (ref 0.61–1.24)
GFR, Estimated: >60 mL/min (ref >60)
Glucose, Bld: 92 mg/dL (ref 70–99)
Potassium: 4.6 mmol/L (ref 3.5–5.1)
Sodium: 140 mmol/L (ref 135–145)

## 2024-04-25 NOTE — Progress Notes (Signed)
 For Anesthesia: PCP - Verma Gobble, NP  Cardiologist - Hugh Madura, MD . LOV: 11/23/23  Bowel Prep reminder:  Chest x-ray - CT Chest: 08/26/23 EKG - 11/23/23 Stress Test -  ECHO - 05/19/14 Cardiac Cath - 05/12/24 Pacemaker/ICD device last checked: Pacemaker orders received: Device Rep notified:  Spinal Cord Stimulator: N/A  Sleep Study - N/A CPAP -   Fasting Blood Sugar - N/A Checks Blood Sugar _____ times a day Date and result of last Hgb A1c-  Last dose of GLP1 agonist- N/A GLP1 instructions:   Last dose of SGLT-2 inhibitors- N/A SGLT-2 instructions:   Blood Thinner Instructions: Aspirin  Instructions:To hold it 5 days before procedure. Last Dose:  Activity level: Can go up a flight of stairs and activities of daily living without stopping and without chest pain and/or shortness of breath   Able to exercise without chest pain and/or shortness of breath  Anesthesia review: Hx: CAD,CABG x 4,HTN,Stroke.  Patient denies shortness of breath, fever, cough and chest pain at PAT appointment   Patient verbalized understanding of instructions that were given to them at the PAT appointment. Patient was also instructed that they will need to review over the PAT instructions again at home before surgery.

## 2024-04-26 NOTE — Progress Notes (Signed)
 Anesthesia Chart Review   Case: [id number] Date/Time: 04/29/24 0945   Procedure: CYSTOSCOPY/URETEROSCOPY/HOLMIUM LASER/STENT PLACEMENT (Right) - CYSTOSCOPY/RIGHT URETEROSCOPY/HOLMIUM LASER/STENT PLACEMENT/RETROGRADE PYELOGRAM   Anesthesia type: Choice   Diagnosis: Ureteral calculus [N20.1]   Pre-op diagnosis: RIGHT URETEROPELVIC JUNCTION STONE   Location: [facility] PROC[facility]   Surgeons: Homero Luster, MD       DISCUSSION:87 y.o. former smoker with h/o HTN, CAD s/p CABG 2015, abdominal aortic aneurysm (3.7cm), hyperthyroidism, right ureteropelvic junction stone scheduled for above procedure 04/29/2024 with Dr. Homero Luster.   Pt last seen by cardiology 11/23/2023. Per OV note pt stable at this visit, no cv sx.  1 year follow up recommended.  VS: BP 126/70   Pulse 82   Temp 36.7 C (Oral)   Ht 6\' 2"  (1.88 m)   Wt 79.8 kg   SpO2 97%   BMI 22.60 kg/m   PROVIDERS: Verma Gobble, NP is PCP   Cardiologist - Hugh Madura, MD  LABS: Labs reviewed: Acceptable for surgery. (all labs ordered are listed, but only abnormal results are displayed)  Labs Reviewed  CBC - Abnormal; Notable for the following components:      Result Value   Hemoglobin 12.1 (*)    Platelets 147 (*)    All other components within normal limits  BASIC METABOLIC PANEL WITH GFR     IMAGES:   EKG:   CV: Echo 05/19/2014 Study Conclusions   - Left ventricle: Systolic function was normal. The estimated    ejection fraction was in the range of 55% to 60%. Wall motion was    normal; there were no regional wall motion abnormalities.  - Aortic valve: No evidence of vegetation. There was trivial    regurgitation.  - Mitral valve: No evidence of vegetation. There was mild    regurgitation.  - Left atrium: No evidence of thrombus in the appendage.  - Atrial septum: No defect or patent foramen ovale was identified.  - Tricuspid valve: No evidence of vegetation.  - Pulmonic valve: No evidence of  vegetation.   Past Medical History:  Diagnosis Date   ACNE ROSACEA 06/27/2009   ACTINIC KERATOSIS, HEAD 04/18/2009   Acute maxillary sinusitis 05/14/2010   ALLERGIC RHINITIS 04/09/2007   Anal fissure 04/07/2016   B12 DEFICIENCY 06/07/2007   BACK PAIN WITH RADICULOPATHY 04/24/2008   Bilateral nephrolithiasis 07/07/2022   CT abd 06/2022 1. Nonobstructive bilateral nephrolithiasis measuring up to 5 mm on the right and 6 mm on the left.   Cancer [facility])    skin   CHEST WALL PAIN, ACUTE 06/15/2009   Chronic maxillary sinusitis 05/29/2008   COLITIS 04/27/2009   COLONIC POLYPS, HX OF 04/27/2009   tubular adenomas   Coronary artery disease 05/12/2014   Cath 05/12/2014 w/ severe 3-vessel CAD and preserved LV function, EF 55%   DERMATITIS, ATOPIC 10/12/2007   Diverticular disease 07/07/2022   CT abd 06/2022 . Colonic diverticulosis with no acute diverticulitis.   DIVERTICULOSIS, COLON 04/27/2009   ECCHYMOSES, SPONTANEOUS 06/27/2008   Elevated sedimentation rate 05/02/2009   ESOPHAGEAL STRICTURE 04/27/2009   GASTRITIS, CHRONIC 04/27/2009   History of kidney stones    HYPERLIPIDEMIA 03/13/2008   HYPERTENSION 04/09/2007   Hyperthyroidism    Iliac aneurysm (HCC) 07/07/2022   CT abd 06/2022 . Aneurysmal left common iliac artery (1.6 cm).     Internal bleeding hemorrhoids 01/22/2015   01/22/2015 Seen at anoscopy, grade 1 all 3 positions    Irritable bowel syndrome 08/11/2007  KIDNEY DISEASE 04/09/2007   NECK PAIN 09/14/2008   NEUROPATHY, IDIOPATHIC PERIPHERAL NEC 08/11/2007   OSTEOARTHRITIS, WRIST, RIGHT 08/05/2010   Postoperative delirium 05/20/2014   S/P CABG x 4 05/19/2014   LIMA to LAD, SVG to diag, SVG to OM, SVG to PDA, EVH via right thigh and leg    Past Surgical History:  Procedure Laterality Date   CARDIAC CATHETERIZATION     COLONOSCOPY     CORONARY ARTERY BYPASS GRAFT N/A 05/19/2014   Procedure: CORONARY ARTERY BYPASS GRAFTING (CABG);  Surgeon: Gardenia Jump, MD;  Location:  [facility] OR;  Service: Open Heart Surgery;  Laterality: N/A;  Times 4 using left internal mammary artery and endoscopically harvested right saphenous vein   ESOPHAGOGASTRODUODENOSCOPY     EXTRACORPOREAL SHOCK WAVE LITHOTRIPSY Right 01/29/2024   Procedure: LITHOTRIPSY, ESWL;  Surgeon: Mallie Seal, MD;  Location: [facility];  Service: Urology;  Laterality: Right;   EYE SURGERY     FINGER SURGERY     cut off end of finger   FLEXIBLE SIGMOIDOSCOPY     HEMORRHOID BANDING     HERNIA REPAIR     INCISION AND DRAINAGE WOUND WITH FOREIGN BODY REMOVAL Left 12/20/2013   Procedure: INCISION AND DRAINAGE LEFT INDEX FINGER;  Surgeon: Milagros Alf, MD;  Location: [facility];  Service: Orthopedics;  Laterality: Left;   INTRAOPERATIVE TRANSESOPHAGEAL ECHOCARDIOGRAM N/A 05/19/2014   Procedure: INTRAOPERATIVE TRANSESOPHAGEAL ECHOCARDIOGRAM;  Surgeon: Gardenia Jump, MD;  Location: [facility] OR;  Service: Open Heart Surgery;  Laterality: N/A;   lamenectomy     LEFT HEART CATHETERIZATION WITH CORONARY ANGIOGRAM N/A 05/12/2014   Procedure: LEFT HEART CATHETERIZATION WITH CORONARY ANGIOGRAM;  Surgeon: Mickiel Albany, MD;  Location: [facility] CATH LAB;  Service: Cardiovascular;  Laterality: N/A;   LUMBAR FUSION     TONSILLECTOMY     VARICOSE VEIN SURGERY Left     MEDICATIONS:  acetaminophen  (TYLENOL ) 500 MG tablet   aspirin  81 MG EC tablet   divalproex  (DEPAKOTE ) 125 MG DR tablet   donepezil  (ARICEPT ) 10 MG tablet   FLUoxetine  (PROZAC ) 20 MG capsule   memantine  (NAMENDA ) 10 MG tablet   methimazole  (TAPAZOLE ) 5 MG tablet   Multiple Vitamins-Minerals (CENTRUM SILVER PO)   rosuvastatin  (CRESTOR ) 40 MG tablet   tamsulosin  (FLOMAX ) 0.4 MG CAPS capsule   No current facility-administered medications for this encounter.    Chick Cotton Ward, PA-C [facility] [phone]

## 2024-04-27 NOTE — H&P (Signed)
 33  [name] had an episode of retention and elevated residual urine volumes after surgery. He is here to check another residual. We had some issues giving him antibiotics with amiodarone as dictated. I had given him Rapaflo and asked to go back on Flomax in the future.  Patient in September saw the nurse practitioner after hospitalization. The patient had a urinary tract infection and negative blood cultures  Flow is good. The hospitalization I believe was for cataract surgery. Stable nocturia x2 and clinically not infected   50 g benign prostate   Role of desmopressin discussed. Black box warning and side effects discussed. Samples of higher dose given. Will call tomorrow  Patient chose watchful waiting and fluid modifications. He has good nights and bad nights and does drink a lot of fluids.   Nocturia persisting and get up 12 times last night. He is on Flomax. I'd a bit of a circular conversation whether not ever tried desmopressin.  Patient was given Myrbetriq to add Flomax last time  Myrbetriq I do not think helped him was not covered. He went back on Flomax as a monotherapy send is doing very well with good flow and minimal nocturia very pleased. 90 x 3 sent to pharmacy and I will see him in 1 year   TOday  Flow good. Frequency stable. No blood. No infections. 90 x 3 sent to pharmacy  50-60 g benign prostate  See patient in 1 year   10/18/21: 88 year old male who presents today for follow-up. He reports he has no complaints and he is unsure why he is here today. He reports nocturia of 2 Dr. Inga Manges times per night. Denies dysuria, straining on urination, urinary frequency or urgency.   02/12/2024: [name] is an 88 year old man who presents today for follow-up after he underwent a right-sided lithotripsy. He has not noticed any fragments pass but he has not been straining his urine. He denies further pain, urinary changes. He remains on tamsulosin twice per day.   03/22/2024: [name] presents today for follow-up of right lithotripsy. His renal ultrasound today shows some pelvic dilation and a possible stone near the right UVJ measuring 0.4 mm. He is not having any pain. He does have some urinary frequency. He remains on tamsulosin twice per day.   04/05/2024: [name] is an 88 year old man who presents for follow-up of a remaining right UVJ fragment after he underwent lithotripsy. He denies symptoms of pain, urgency, frequency and gross hematuria.     ALLERGIES: Cipro  TABS - Swelling Lipitor TABS - Nausea Penicillins - Skin Rash Trazodone and Nefazodone - Dizziness    MEDICATIONS: Aspir 81 1 tablet PO Daily  Atorvastatin Calcium 20 MG Tablet 1 tablet PO Daily  methIMAzole 10 MG Tablet  Multi-Day Vitamins 1 PO Daily  Tylenol  Extra Strength 500 MG Tablet 1 tablet PO PRN  Vitamin B12  Zolpidem Tartrate     GU PSH: No GU PSH    NON-GU PSH: Heart Surgery (Unspecified)     GU PMH: Ureteral calculus - 03/22/2024, - 02/12/2024 Renal calculus - 02/12/2024, Bilateral kidney stones, - 2014 Nocturia - 10/18/2021, - 2021 (Stable), - 2019, Nocturia, - 2015 Weak Urinary Stream - 2021 Urinary Frequency (Stable) - 2020, - 2019 Lower abdominal pain, unspecified - 2017 BPH w/o LUTS, Enlarged prostate without lower urinary tract symptoms (luts) - 2015 Urinary Retention, Unspec, Incomplete bladder emptying - 2015, Acute Urinary Retention, - 2014 Urinary Tract Inf, Unspec site, Urinary tract infection - 2015 ED  due to arterial insufficiency, Erectile dysfunction due to arterial insufficiency - May 12, 2014 Bladder-neck stenosis/contracture, Bladder neck obstruction - 05-12-13 History of urolithiasis, Nephrolithiasis - 12-May-2013 Low back pain, Lower back pain - 05-12-13      PMH Notes:  2009-04-02 16:47:33 - Note: Flank Pain Right   NON-GU PMH: Encounter for general adult medical examination without abnormal findings, Encounter for preventive health examination - 05-12-2014 Scoliosis,  unspecified, Scoliosis - 05-12-2013    FAMILY HISTORY: Death - Mother, Father   SOCIAL HISTORY: Marital Status: Married Preferred Language: English; Ethnicity: Not Hispanic Or Latino; Race: White Current Smoking Status: Patient does not smoke anymore. Has not smoked since 05/17/1998.  Has never drank.  Drinks 1 caffeinated drink per day. Patient's occupation is/was Retired.    REVIEW OF SYSTEMS:    GU Review Male:   Patient denies frequent urination, hard to postpone urination, burning/ pain with urination, get up at night to urinate, leakage of urine, stream starts and stops, trouble starting your stream, have to strain to urinate , erection problems, and penile pain.  Gastrointestinal (Upper):   Patient denies nausea, vomiting, and indigestion/ heartburn.  Gastrointestinal (Lower):   Patient denies diarrhea and constipation.  Constitutional:   Patient denies fever, night sweats, weight loss, and fatigue.  Skin:   Patient denies skin rash/ lesion and itching.  Eyes:   Patient denies blurred vision and double vision.  Musculoskeletal:   Patient denies back pain and joint pain.  Neurological:   Patient denies headaches and dizziness.  Psychologic:   Patient denies depression and anxiety.   VITAL SIGNS: None   MULTI-SYSTEM PHYSICAL EXAMINATION:    Constitutional: Well-nourished. No physical deformities. Normally developed. Good grooming.  Cardiovascular: Normal temperature, normal extremity pulses, no swelling, no varicosities.  Skin: No paleness, no jaundice, no cyanosis. No lesion, no ulcer, no rash.  Neurologic / Psychiatric: Oriented to time, oriented to place, oriented to person. No depression, no anxiety, no agitation.  Gastrointestinal: No mass, no tenderness, no rigidity, non obese abdomen.     Complexity of Data:  Source Of History:  Patient  Records Review:   Previous Doctor Records, Previous Patient Records  Urine Test Review:   Urinalysis  X-Ray Review: Renal Ultrasound  (Limited): Reviewed Films. Reviewed Report. Discussed With Patient.     09/05/13 09/01/11 08/30/10 02/18/10 08/20/09 02/26/09 08/31/08 03/01/08  PSA  Total PSA 3.18  3.27  2.89  3.68  3.53  2.77  1.95  2.38     12/29/03  Hormones  Testosterone , Total 4.10     04/05/24  Urinalysis  Urine Appearance Clear   Urine Color Yellow   Urine Glucose Neg mg/dL  Urine Bilirubin Neg mg/dL  Urine Ketones Neg mg/dL  Urine Specific Gravity 1.025   Urine Blood Neg ery/uL  Urine pH 6.5   Urine Protein 2+ mg/dL  Urine Urobilinogen 0.2 mg/dL  Urine Nitrites Neg   Urine Leukocyte Esterase Neg leu/uL  Urine WBC/hpf 0 - 5/hpf   Urine RBC/hpf NS (Not Seen)   Urine Epithelial Cells NS (Not Seen)   Urine Bacteria Few (10-25/hpf)   Urine Mucous Present   Urine Yeast NS (Not Seen)   Urine Trichomonas Not Present   Urine Cystals NS (Not Seen)   Urine Casts Hyaline   Urine Sperm Not Present    PROCEDURES:         Renal Ultrasound (Limited) - 32355  Kidney: Right Length: 11.5 cm Depth: 6.5 cm Cortical Width: 1.3 cm Width: 4.7 cm  Right Kidney/Ureter:  Minimal amount of fluid in collecting system. Decreased from previous US .  Bladder:  Right UVJ stone still visualized. Right Jet visualized.      Patient confirmed No Neulasta OnPro Device.           Visit Complexity - G2211          Urinalysis w/Scope Dipstick Dipstick Cont'd Micro  Color: Yellow Bilirubin: Neg mg/dL WBC/hpf: 0 - 5/hpf  Appearance: Clear Ketones: Neg mg/dL RBC/hpf: NS (Not Seen)  Specific Gravity: 1.025 Blood: Neg ery/uL Bacteria: Few (10-25/hpf)  pH: 6.5 Protein: 2+ mg/dL Cystals: NS (Not Seen)  Glucose: Neg mg/dL Urobilinogen: 0.2 mg/dL Casts: Hyaline    Nitrites: Neg Trichomonas: Not Present    Leukocyte Esterase: Neg leu/uL Mucous: Present      Epithelial Cells: NS (Not Seen)      Yeast: NS (Not Seen)      Sperm: Not Present    ASSESSMENT:      ICD-10 Details  1 GU:   Ureteral calculus - N20.1 Right, Acute,  Uncomplicated   PLAN:           Document Letter(s):  Created for Patient: Clinical Summary         Notes:   Ultrasound shows continued obstruction that is mild and I can also see his stone at the right UVJ. I discussed options with him and advised ureteroscopy for removal of distal right ureteral stone.   Urinalysis will be sent for precautionary culture today. Stone intervention was discussed in detail today. For ureteroscopy, the patient understands that there is a chance for a staged procedure. Patient also understands that there is risk for bleeding, infection, injury to surrounding organs, and general risks of anesthesia. The patient also understands the placement of a stent and the risks of stent placement including, risk for infection, the risk for pain, and the risk for injury. For ESWL, the patient understands that there is a chance of failure of procedure, there is also a risk for bruising, infection, bleeding, and injury to surrounding structures. The patient verbalized understanding to these risks.

## 2024-04-28 ENCOUNTER — Encounter (HOSPITAL_COMMUNITY): Payer: Self-pay | Admitting: Urology

## 2024-04-28 NOTE — Anesthesia Preprocedure Evaluation (Addendum)
 Anesthesia Evaluation  Patient identified by MRN, date of birth, ID band Patient awake    Reviewed: Allergy & Precautions, NPO status , Patient's Chart, lab work & pertinent test results, reviewed documented beta blocker date and time   Airway Mallampati: II  TM Distance: >3 FB     Dental  (+) Caps, Dental Advisory Given, Missing   Pulmonary COPD,  COPD inhaler, former smoker   breath sounds clear to auscultation + decreased breath sounds      Cardiovascular hypertension, Pt. on medications + CAD  Normal cardiovascular exam Rhythm:Regular Rate:Normal  Cardiac cath 05/12/2014 w/ severe 3-vessel CAD and preserved LV function, EF 55% S/P CABG x 4 05/19/2014 LIMA to LAD, SVG to diag, SVG to OM, SVG to PDA  Echo 05/2014 EF 55-60%  EKG 11/23/23 Normal sinus rhythm Incomplete right bundle branch block   Neuro/Psych  Headaches PSYCHIATRIC DISORDERS Anxiety     Peripheral neuropathy  Neuromuscular disease    GI/Hepatic negative GI ROS, Neg liver ROS,,,  Endo/Other   Hyperthyroidism   Renal/GU Renal diseaseRight UPJ calculus  negative genitourinary   Musculoskeletal negative musculoskeletal ROS (+)    Abdominal   Peds  Hematology  (+) Blood dyscrasia, anemia Plt 147k   Anesthesia Other Findings   Reproductive/Obstetrics                             Anesthesia Physical Anesthesia Plan  ASA: 3  Anesthesia Plan: General   Post-op Pain Management: Dilaudid IV and Precedex    Induction:   PONV Risk Score and Plan: 4 or greater and Treatment may vary due to age or medical condition, Ondansetron  and Dexamethasone   Airway Management Planned: LMA  Additional Equipment: None  Intra-op Plan:   Post-operative Plan: Extubation in OR  Informed Consent: I have reviewed the patients History and Physical, chart, labs and discussed the procedure including the risks, benefits and alternatives for the  proposed anesthesia with the patient or authorized representative who has indicated his/her understanding and acceptance.     Dental advisory given  Plan Discussed with: CRNA and Anesthesiologist  Anesthesia Plan Comments: (No Versed )        Anesthesia Quick Evaluation

## 2024-04-29 ENCOUNTER — Ambulatory Visit (HOSPITAL_COMMUNITY)
Admission: RE | Admit: 2024-04-29 | Discharge: 2024-04-29 | Disposition: A | Discharge status: Home / Self Care | Attending: Urology | Admitting: Urology

## 2024-04-29 ENCOUNTER — Ambulatory Visit (HOSPITAL_COMMUNITY)

## 2024-04-29 ENCOUNTER — Ambulatory Visit (HOSPITAL_BASED_OUTPATIENT_CLINIC_OR_DEPARTMENT_OTHER): Payer: Self-pay | Admitting: Anesthesiology

## 2024-04-29 ENCOUNTER — Ambulatory Visit (HOSPITAL_COMMUNITY): Payer: Self-pay | Admitting: Physician Assistant

## 2024-04-29 ENCOUNTER — Encounter (HOSPITAL_COMMUNITY)
Admission: RE | Disposition: A | Discharge status: Home / Self Care | Payer: Self-pay | Source: Home / Self Care | Attending: Urology

## 2024-04-29 ENCOUNTER — Encounter (HOSPITAL_COMMUNITY): Payer: Self-pay | Admitting: Urology

## 2024-04-29 DIAGNOSIS — J449 Chronic obstructive pulmonary disease, unspecified: Secondary | ICD-10-CM | POA: Insufficient documentation

## 2024-04-29 DIAGNOSIS — I2510 Atherosclerotic heart disease of native coronary artery without angina pectoris: Secondary | ICD-10-CM

## 2024-04-29 DIAGNOSIS — I10 Essential (primary) hypertension: Secondary | ICD-10-CM

## 2024-04-29 DIAGNOSIS — Z87891 Personal history of nicotine dependence: Secondary | ICD-10-CM | POA: Diagnosis not present

## 2024-04-29 DIAGNOSIS — Z79899 Other long term (current) drug therapy: Secondary | ICD-10-CM | POA: Diagnosis not present

## 2024-04-29 DIAGNOSIS — N201 Calculus of ureter: Secondary | ICD-10-CM | POA: Diagnosis not present

## 2024-04-29 DIAGNOSIS — E785 Hyperlipidemia, unspecified: Secondary | ICD-10-CM | POA: Diagnosis not present

## 2024-04-29 DIAGNOSIS — N200 Calculus of kidney: Secondary | ICD-10-CM

## 2024-04-29 HISTORY — PX: CYSTOSCOPY/URETEROSCOPY/HOLMIUM LASER/STENT PLACEMENT: SHX6546

## 2024-04-29 SURGERY — CYSTOSCOPY/URETEROSCOPY/HOLMIUM LASER/STENT PLACEMENT
Anesthesia: General | Site: Pelvis | Laterality: Bilateral

## 2024-04-29 MED ORDER — ONDANSETRON HCL 4 MG/2ML IJ SOLN: 4.0000 mg | Freq: Once | INTRAMUSCULAR | Status: DC | PRN

## 2024-04-29 MED ORDER — LIDOCAINE HCL (PF) 2 % IJ SOLN
INTRAMUSCULAR | Status: AC
  Filled 2024-04-29: qty 5

## 2024-04-29 MED ORDER — OXYCODONE HCL 5 MG/5ML PO SOLN: 5.0000 mg | Freq: Once | ORAL | Status: DC | PRN

## 2024-04-29 MED ORDER — SODIUM CHLORIDE 0.9% FLUSH: 3.0000 mL | Freq: Two times a day (BID) | INTRAVENOUS | Status: DC

## 2024-04-29 MED ORDER — GENTAMICIN SULFATE 40 MG/ML IJ SOLN
5.0000 mg/kg | INTRAVENOUS | Status: AC
  Administered 2024-04-29: 400 mg via INTRAVENOUS
  Filled 2024-04-29: qty 10

## 2024-04-29 MED ORDER — EPHEDRINE SULFATE-NACL 50-0.9 MG/10ML-% IV SOSY
PREFILLED_SYRINGE | INTRAVENOUS | Status: DC | PRN
  Administered 2024-04-29: 5 mg via INTRAVENOUS
  Administered 2024-04-29: 10 mg via INTRAVENOUS

## 2024-04-29 MED ORDER — LACTATED RINGERS IV SOLN: INTRAVENOUS | Status: DC

## 2024-04-29 MED ORDER — FENTANYL CITRATE (PF) 100 MCG/2ML IJ SOLN
INTRAMUSCULAR | Status: AC
  Filled 2024-04-29: qty 2

## 2024-04-29 MED ORDER — GLYCOPYRROLATE 0.2 MG/ML IJ SOLN
INTRAMUSCULAR | Status: DC | PRN
  Administered 2024-04-29: .2 mg via INTRAVENOUS

## 2024-04-29 MED ORDER — GLYCOPYRROLATE 0.2 MG/ML IJ SOLN
INTRAMUSCULAR | Status: AC
  Filled 2024-04-29: qty 1

## 2024-04-29 MED ORDER — LIDOCAINE HCL (CARDIAC) PF 100 MG/5ML IV SOSY
PREFILLED_SYRINGE | INTRAVENOUS | Status: DC | PRN
Start: 2024-04-29 — End: 2024-04-29
  Administered 2024-04-29: 40 mg via INTRAVENOUS

## 2024-04-29 MED ORDER — PHENYLEPHRINE 80 MCG/ML (10ML) SYRINGE FOR IV PUSH (FOR BLOOD PRESSURE SUPPORT)
PREFILLED_SYRINGE | INTRAVENOUS | Status: AC
  Filled 2024-04-29: qty 10

## 2024-04-29 MED ORDER — FENTANYL CITRATE (PF) 100 MCG/2ML IJ SOLN
INTRAMUSCULAR | Status: DC | PRN
  Administered 2024-04-29 (×2): 25 ug via INTRAVENOUS

## 2024-04-29 MED ORDER — PHENYLEPHRINE 80 MCG/ML (10ML) SYRINGE FOR IV PUSH (FOR BLOOD PRESSURE SUPPORT)
PREFILLED_SYRINGE | INTRAVENOUS | Status: DC | PRN
  Administered 2024-04-29: 160 ug via INTRAVENOUS
  Administered 2024-04-29 (×2): 120 ug via INTRAVENOUS
  Administered 2024-04-29: 80 ug via INTRAVENOUS

## 2024-04-29 MED ORDER — OXYCODONE HCL 5 MG PO TABS: 5.0000 mg | ORAL_TABLET | Freq: Once | ORAL | Status: DC | PRN

## 2024-04-29 MED ORDER — ONDANSETRON HCL 4 MG/2ML IJ SOLN
INTRAMUSCULAR | Status: AC
  Filled 2024-04-29: qty 2

## 2024-04-29 MED ORDER — ONDANSETRON HCL 4 MG/2ML IJ SOLN
INTRAMUSCULAR | Status: DC | PRN
Start: 2024-04-29 — End: 2024-04-29
  Administered 2024-04-29: 4 mg via INTRAVENOUS

## 2024-04-29 MED ORDER — PROPOFOL 10 MG/ML IV BOLUS
INTRAVENOUS | Status: AC
  Filled 2024-04-29: qty 20

## 2024-04-29 MED ORDER — IOHEXOL 300 MG/ML  SOLN
INTRAMUSCULAR | Status: DC | PRN
  Administered 2024-04-29: 13 mL

## 2024-04-29 MED ORDER — HYDROCODONE-ACETAMINOPHEN 5-325 MG PO TABS: 1.0000 | ORAL_TABLET | Freq: Four times a day (QID) | ORAL | 0 refills | Status: DC | PRN

## 2024-04-29 MED ORDER — PHENYLEPHRINE HCL-NACL 20-0.9 MG/250ML-% IV SOLN
INTRAVENOUS | Status: DC | PRN
  Administered 2024-04-29: 40 ug/min via INTRAVENOUS

## 2024-04-29 MED ORDER — PROPOFOL 10 MG/ML IV BOLUS
INTRAVENOUS | Status: DC | PRN
  Administered 2024-04-29: 100 mg via INTRAVENOUS

## 2024-04-29 MED ORDER — SODIUM CHLORIDE 0.9 % IR SOLN
Status: DC | PRN
Start: 2024-04-29 — End: 2024-04-29
  Administered 2024-04-29: 3000 mL via INTRAVESICAL

## 2024-04-29 MED ORDER — DEXMEDETOMIDINE HCL IN NACL 80 MCG/20ML IV SOLN
INTRAVENOUS | Status: AC
  Filled 2024-04-29: qty 20

## 2024-04-29 MED ORDER — ORAL CARE MOUTH RINSE
15.0000 mL | Freq: Once | OROMUCOSAL | Status: AC
Start: 2024-04-29 — End: 2024-04-29

## 2024-04-29 MED ORDER — DEXAMETHASONE SODIUM PHOSPHATE 10 MG/ML IJ SOLN
INTRAMUSCULAR | Status: AC
  Filled 2024-04-29: qty 1

## 2024-04-29 MED ORDER — EPHEDRINE 5 MG/ML INJ
INTRAVENOUS | Status: AC
  Filled 2024-04-29: qty 5

## 2024-04-29 MED ORDER — CHLORHEXIDINE GLUCONATE 0.12 % MT SOLN
15.0000 mL | Freq: Once | OROMUCOSAL | Status: AC
  Administered 2024-04-29: 15 mL via OROMUCOSAL

## 2024-04-29 MED ORDER — FENTANYL CITRATE PF 50 MCG/ML IJ SOSY: 25.0000 ug | PREFILLED_SYRINGE | INTRAMUSCULAR | Status: DC | PRN

## 2024-04-29 MED ORDER — DEXAMETHASONE SODIUM PHOSPHATE 10 MG/ML IJ SOLN
INTRAMUSCULAR | Status: DC | PRN
  Administered 2024-04-29: 5 mg via INTRAVENOUS

## 2024-04-29 SURGICAL SUPPLY — 21 items
BAG URO CATCHER STRL LF (MISCELLANEOUS) ×1 IMPLANT
BASKET STONE NCOMPASS (UROLOGICAL SUPPLIES) IMPLANT
CATH URETERAL DUAL LUMEN 10F (MISCELLANEOUS) IMPLANT
CATH URETL OPEN 5X70 (CATHETERS) IMPLANT
CLOTH BEACON ORANGE TIMEOUT ST (SAFETY) ×1 IMPLANT
EXTRACTOR STONE NITINOL NGAGE (UROLOGICAL SUPPLIES) IMPLANT
GLOVE SURG SS PI 8.0 STRL IVOR (GLOVE) ×1 IMPLANT
GOWN STRL SURGICAL XL XLNG (GOWN DISPOSABLE) ×1 IMPLANT
GUIDEWIRE STR DUAL SENSOR (WIRE) ×1 IMPLANT
KIT BALLN UROMAX 15FX4 (MISCELLANEOUS) IMPLANT
KIT TURNOVER KIT A (KITS) ×1 IMPLANT
LASER FIB FLEXIVA PULSE ID 365 (Laser) IMPLANT
LASER FIB FLEXIVA PULSE ID 550 (Laser) IMPLANT
LASER FIB FLEXIVA PULSE ID 910 (Laser) IMPLANT
MANIFOLD NEPTUNE II (INSTRUMENTS) ×1 IMPLANT
PACK CYSTO (CUSTOM PROCEDURE TRAY) ×1 IMPLANT
SHEATH NAVIGATOR HD 11/13X36 (SHEATH) IMPLANT
STENT URET 6FRX26 CONTOUR (STENTS) IMPLANT
TRACTIP FLEXIVA PULS ID 200XHI (Laser) IMPLANT
TUBING CONNECTING 10 (TUBING) ×1 IMPLANT
TUBING UROLOGY SET (TUBING) ×1 IMPLANT

## 2024-04-29 NOTE — Interval H&P Note (Signed)
 History and Physical Interval Note:  He has no pain but hasn't passed a stone.  He has a right UVJ stone and a right lower pole stone.  I discussed management of both stones and the need for a stent.   04/29/2024 9:46 AM  Timothy Gallagher  has presented today for surgery, with the diagnosis of RIGHT URETEROPELVIC JUNCTION STONE.  The various methods of treatment have been discussed with the patient and family. After consideration of risks, benefits and other options for treatment, the patient has consented to  Procedure(s) with comments: CYSTOSCOPY/URETEROSCOPY/HOLMIUM LASER/STENT PLACEMENT (Right) - CYSTOSCOPY/RIGHT URETEROSCOPY/HOLMIUM LASER/STENT PLACEMENT/RETROGRADE PYELOGRAM as a surgical intervention.  The patient's history has been reviewed, patient examined, no change in status, stable for surgery.  I have reviewed the patient's chart and labs.  Questions were answered to the patient's satisfaction.     Timothy Gallagher

## 2024-04-29 NOTE — Op Note (Signed)
 Procedure: 1.  Cystoscopy with bilateral retrograde pyelograms and interpretation. 2.  Cystoscopy with removal of bladder stone, simple. 3.  Left ureteroscopy with holmium laser application, stone extraction and insertion of left double-J stent with tether. 4.  Application of fluoroscopy.  Preop diagnosis: Right distal ureteral stone.  Postop diagnosis: 1.  Interval passage of right distal ureteral stone into the bladder. 2.  Left distal ureteral stone.  Surgeon: Dr. Homero Luster.  Anesthesia: General.  Specimen: Stone fragments.  Drain: 6 [place] by 26 cm left contour double-J stent with tether.  EBL: None.  Complications: None.  Indications: The patient is an 88 year old male who previously undergone lithotripsy for a right proximal ureteral stone.  He passed a few fragments but had a stubborn fragment at the right UVJ that was felt to require ureteroscopic extraction.  Procedure: He was taken the operating room where he was given antibiotics.  A general anesthetic was induced.  He was placed in lithotomy position and fitted with PAS hose.  His perineum and genitalia were prepped with Betadine solution he was draped in usual sterile fashion.  Cystoscopy was performed using the 21 [place] scope and 30 degree lens.  Examination revealed a normal urethra.  The external sphincter was intact.  The prostatic urethra was approximately 3 cm in length with bilobar hyperplasia and a small middle lobe.  Examination of the bladder revealed moderate to severe trabeculation with several cellules and small diverticuli.  No mucosal lesions were identified but there were stone fragments including 1 approximately 4 mm in the base of the bladder raising a suspicion that he had passed the distal fragment.  The fragments were evacuated through the cystoscope.  On fluoroscopy the previously noted right distal fragment was no longer apparent but there was approximately a 6 mm calcification in the left pelvis that  did not appear typical for a phlebolith so I felt further investigation was indicated.  A 5 French open-ended catheter was then used to perform a left retrograde pyelogram with Omnipaque .    Left retrograde pyelogram demonstrated J hooking of the ureter with a filling defect in the distal ureter consistent with the 6 mm nonobstructing ureteral stone.  He did have a left lower pole stone in a calyx with a narrow infundibulum.  A right retrograde pyelogram was then performed to ensure that the fragment had indeed passed using the 5 [place] open-ended catheter and Omnipaque .  The right retrograde pyelogram demonstrated J hooking of the distal ureter without obstruction or filling defects to suggest a residual stone.  He also has a right lower pole stone in the calyx with a narrow infundibulum.  Once retrograde pyelography had been complete a sensor wire was advanced to the left kidney under fluoroscopic guidance and a 4 cm x 15 French high-pressure balloon was passed over the wire across the intramural ureter and dilated to 20 atm under fluoroscopic guidance.  The balloon was then deflated and removed along with the cystoscope.  The 6.5 French semirigid ureteroscope was then advanced alongside the wire and the stone was visualized in the distal ureter.  The stone was then fragmented using the 365 m holmium laser fiber with the laser set on the dusting setting with 0.3 J and 63 Hz on the left pedal and 0.8 J and 10 Hz on the right pedal.  The stone fragmented easily and the fragments were removed to the bladder using an engage basket.  The cystoscope was then reinserted and the stone fragments were retrieved.  The cystoscope was then reinserted over the wire and a 6 [place] by 26 cm contour double-J stent with tether was then passed to the kidney under fluoroscopic guidance.  The wire was removed, leaving a good coil in the kidney and a good coil in the bladder.  The cystoscope was removed after the bladder  was drained leaving the stent string exiting the urethra.  The string was secured to the patient's penis.  He was taken down from lithotomy position, his anesthetic was reversed and he was moved to recovery in stable condition.  There were no complications.

## 2024-04-29 NOTE — Discharge Instructions (Signed)
 You may remove the stent if you feel comfortable doing it by pulling the attached string.   If you don't feel you can do that, we will remove it at your follow up visit.

## 2024-04-29 NOTE — Anesthesia Procedure Notes (Signed)
 Procedure Name: LMA Insertion Date/Time: 04/29/2024 10:27 AM  Performed by: Nola Battiest, CRNAPre-anesthesia Checklist: Patient identified, Emergency Drugs available, Suction available and Patient being monitored Patient Re-evaluated:Patient Re-evaluated prior to induction Oxygen Delivery Method: Circle System Utilized Preoxygenation: Pre-oxygenation with 100% oxygen Induction Type: IV induction Ventilation: Mask ventilation without difficulty LMA: LMA inserted LMA Size: 4.0 Number of attempts: 1 Airway Equipment and Method: Bite block Placement Confirmation: positive ETCO2 Tube secured with: Tape Dental Injury: Teeth and Oropharynx as per pre-operative assessment

## 2024-04-29 NOTE — Transfer of Care (Signed)
 Immediate Anesthesia Transfer of Care Note  Patient: Timothy Gallagher  Procedure(s) Performed: CYSTOSCOPY/URETEROSCOPY/HOLMIUM LASER/STENT PLACEMENT (Bilateral: Pelvis)  Patient Location: PACU  Anesthesia Type:General  Level of Consciousness: awake and patient cooperative  Airway & Oxygen Therapy: Patient Spontanous Breathing and Patient connected to face mask oxygen  Post-op Assessment: Report given to RN and Post -op Vital signs reviewed and stable  Post vital signs: Reviewed and stable  Last Vitals:  Vitals Value Taken Time  BP 141/75 1112 04/29/24   Temp    Pulse 80 1112 04/29/24  Resp 16 1112 04/29/24  SPO2  100% 1112 04/29/24    Last Pain:  Vitals:   04/29/24 0821  TempSrc: Oral         Complications: No notable events documented.

## 2024-04-29 NOTE — Anesthesia Postprocedure Evaluation (Signed)
 Anesthesia Post Note  Patient: Timothy Gallagher  Procedure(s) Performed: CYSTOSCOPY/URETEROSCOPY/HOLMIUM LASER/STENT PLACEMENT (Bilateral: Pelvis)     Patient location during evaluation: PACU Anesthesia Type: General Level of consciousness: awake and alert and oriented Pain management: pain level controlled Vital Signs Assessment: post-procedure vital signs reviewed and stable Respiratory status: spontaneous breathing, nonlabored ventilation and respiratory function stable Cardiovascular status: blood pressure returned to baseline and stable Postop Assessment: no apparent nausea or vomiting Anesthetic complications: no   No notable events documented.  Last Vitals:  Vitals:   04/29/24 1200 04/29/24 1215  BP: (!) 155/88 (!) 156/85  Pulse:  71  Resp: (!) 22   Temp:  36.4 C  SpO2: 96% 99%    Last Pain:  Vitals:   04/29/24 1215  TempSrc: Oral  PainSc: 0-No pain                 Timothy Arneson A.

## 2024-04-30 ENCOUNTER — Encounter (HOSPITAL_COMMUNITY): Payer: Self-pay | Admitting: Urology

## 2024-05-02 ENCOUNTER — Encounter: Payer: Self-pay | Admitting: Nurse Practitioner

## 2024-05-02 ENCOUNTER — Ambulatory Visit (INDEPENDENT_AMBULATORY_CARE_PROVIDER_SITE_OTHER): Admitting: Nurse Practitioner

## 2024-05-02 VITALS — BP 132/84 | HR 81 | Temp 97.6°F | Ht 74.0 in | Wt 180.4 lb

## 2024-05-02 DIAGNOSIS — F419 Anxiety disorder, unspecified: Secondary | ICD-10-CM | POA: Diagnosis not present

## 2024-05-02 DIAGNOSIS — F01B3 Vascular dementia, moderate, with mood disturbance: Secondary | ICD-10-CM

## 2024-05-02 DIAGNOSIS — N401 Enlarged prostate with lower urinary tract symptoms: Secondary | ICD-10-CM | POA: Diagnosis not present

## 2024-05-02 DIAGNOSIS — E0590 Thyrotoxicosis, unspecified without thyrotoxic crisis or storm: Secondary | ICD-10-CM

## 2024-05-02 DIAGNOSIS — N138 Other obstructive and reflux uropathy: Secondary | ICD-10-CM

## 2024-05-02 DIAGNOSIS — R42 Dizziness and giddiness: Secondary | ICD-10-CM

## 2024-05-02 MED ORDER — SERTRALINE HCL 50 MG PO TABS
50.0000 mg | ORAL_TABLET | Freq: Every day | ORAL | 1 refills | Status: DC
Start: 2024-05-02 — End: 2024-06-27

## 2024-05-02 NOTE — Assessment & Plan Note (Signed)
 TSH at goal on tapazole . Taking every other day.

## 2024-05-02 NOTE — Assessment & Plan Note (Signed)
 Recent kidney stone, followed by nephrology and on flomax .  Flomax  also could be contributing to dizziness To make sure to stay hydrated and follow up with urology this week

## 2024-05-02 NOTE — Patient Instructions (Addendum)
 For MOOD- Start zoloft  50 mg by mouth daily STOP prozac    May need to increase depakote  due to agitation but want to change over to zoloft  first

## 2024-05-02 NOTE — Assessment & Plan Note (Signed)
 Progressive decline noted with increase in behaviors to increase depakote  to twice daily, no significant changes in dizziness with increase in Depakote  per wife, may need to increase evening dose to 2 tablets if behaviors worsen.

## 2024-05-02 NOTE — Progress Notes (Signed)
 Careteam: Patient Care Team: Verma Gobble, NP as PCP - General (Geriatric Medicine) Hugh Madura, MD as PCP - Cardiology (Cardiology) Erman Hayward, MD as Consulting Physician (Urology) Alto Atta, Scot Cutter, MD as Consulting Physician (Ophthalmology) Arleen Lacer, MD as Consulting Physician (Cardiology) Emory Harps (Neurology) Emilie Harden, MD as Consulting Physician (Internal Medicine)  PLACE OF SERVICE:  [facility]  Advanced Directive information    Allergies  Allergen Reactions   Lipitor  [Atorvastatin ] Other (See Comments)    REACTION: nausea and blurred vision   Trazodone  And Nefazodone Other (See Comments)    dizzy   Ciprofloxacin  Swelling   Mycophenolate Mofetil Other (See Comments)    REACTION: unspecified   Amoxicillin Rash   Penicillins Rash    Chief Complaint  Patient presents with   Dizziness    Dizziness no better.     HPI:  Discussed the use of AI scribe software for clinical note transcription with the patient, who gave verbal consent to proceed.  History of Present Illness Timothy Gallagher is an 88 year old male who presents with persistent dizziness and medication management.  He has experienced persistent dizziness for at least a couple of years, with worsening over the past six months. The dizziness occurs daily, requiring him to stop and wait for it to pass. Although the episodes are brief, they are bothersome. No falls, but he sometimes feels unsteady, particularly when rising from a seated position, and uses objects for support when moving around. The dizziness has not changed with the recent increase in Depakote  dosage to twice a day. Wife does report behaviors are somewhat better- continues to have agitation in the afternoon/evening.   Reports sleep is doing well. He talks a lot in his sleep. Wife reports sleep is not an issue at this time  He was recently hospitalized for kidney stones, with two stones removed  last Friday. He was prescribed hydrocodone  for pain management and has taken four pills since discharge. He experiences stomach pain, which is being managed with the medication. A follow-up with the urologist is scheduled for later this week.   Review of Systems:  Review of Systems  Constitutional:  Negative for chills, fever and weight loss.  HENT:  Negative for tinnitus.   Respiratory:  Negative for cough, sputum production and shortness of breath.   Cardiovascular:  Negative for chest pain, palpitations and leg swelling.  Gastrointestinal:  Negative for abdominal pain, constipation, diarrhea and heartburn.  Genitourinary:  Negative for dysuria, frequency and urgency.  Musculoskeletal:  Negative for back pain, falls, joint pain and myalgias.  Skin: Negative.   Neurological:  Positive for dizziness. Negative for headaches.  Psychiatric/Behavioral:  Positive for memory loss. Negative for depression. The patient is nervous/anxious. The patient does not have insomnia.     Past Medical History:  Diagnosis Date   ACNE ROSACEA 06/27/2009   ACTINIC KERATOSIS, HEAD 04/18/2009   Acute maxillary sinusitis 05/14/2010   ALLERGIC RHINITIS 04/09/2007   Anal fissure 04/07/2016   B12 DEFICIENCY 06/07/2007   BACK PAIN WITH RADICULOPATHY 04/24/2008   Bilateral nephrolithiasis 07/07/2022   CT abd 06/2022 1. Nonobstructive bilateral nephrolithiasis measuring up to 5 mm on the right and 6 mm on the left.   Cancer [facility])    skin   CHEST WALL PAIN, ACUTE 06/15/2009   Chronic maxillary sinusitis 05/29/2008   COLITIS 04/27/2009   COLONIC POLYPS, HX OF 04/27/2009   tubular adenomas   Coronary artery disease 05/12/2014   Cath  05/12/2014 w/ severe 3-vessel CAD and preserved LV function, EF 55%   DERMATITIS, ATOPIC 10/12/2007   Diverticular disease 07/07/2022   CT abd 06/2022 . Colonic diverticulosis with no acute diverticulitis.   DIVERTICULOSIS, COLON 04/27/2009   ECCHYMOSES, SPONTANEOUS 06/27/2008    Elevated sedimentation rate 05/02/2009   ESOPHAGEAL STRICTURE 04/27/2009   GASTRITIS, CHRONIC 04/27/2009   History of kidney stones    HYPERLIPIDEMIA 03/13/2008   HYPERTENSION 04/09/2007   Hyperthyroidism    Iliac aneurysm (HCC) 07/07/2022   CT abd 06/2022 . Aneurysmal left common iliac artery (1.6 cm).     Internal bleeding hemorrhoids 01/22/2015   01/22/2015 Seen at anoscopy, grade 1 all 3 positions    Irritable bowel syndrome 08/11/2007   KIDNEY DISEASE 04/09/2007   NECK PAIN 09/14/2008   NEUROPATHY, IDIOPATHIC PERIPHERAL NEC 08/11/2007   OSTEOARTHRITIS, WRIST, RIGHT 08/05/2010   Postoperative delirium 05/20/2014   S/P CABG x 4 05/19/2014   LIMA to LAD, SVG to diag, SVG to OM, SVG to PDA, EVH via right thigh and leg   Past Surgical History:  Procedure Laterality Date   CARDIAC CATHETERIZATION     COLONOSCOPY     CORONARY ARTERY BYPASS GRAFT N/A 05/19/2014   Procedure: CORONARY ARTERY BYPASS GRAFTING (CABG);  Surgeon: Gardenia Jump, MD;  Location[facility] OR;  Service: Open Heart Surgery;  Laterality: N/A;  Times 4 using left internal mammary artery and endoscopically harvested right saphenous vein   CYSTOSCOPY/URETEROSCOPY/HOLMIUM LASER/STENT PLACEMENT Bilateral 04/29/2024   Procedure: CYSTOSCOPY/URETEROSCOPY/HOLMIUM LASER/STENT PLACEMENT;  Surgeon: Homero Luster, MD;  Location: [facility] ORS;  Service: Urology;  Laterality: Bilateral;  CYSTOSCOPY/RIGHT URETEROSCOPY/HOLMIUM LASER/STENT PLACEMENT/RETROGRADE PYELOGRAM   ESOPHAGOGASTRODUODENOSCOPY     EXTRACORPOREAL SHOCK WAVE LITHOTRIPSY Right 01/29/2024   Procedure: LITHOTRIPSY, ESWL;  Surgeon: Mallie Seal, MD;  Location: [facility] ORS;  Service: Urology;  Laterality: Right;   EYE SURGERY     FINGER SURGERY     cut off end of finger   FLEXIBLE SIGMOIDOSCOPY     HEMORRHOID BANDING     HERNIA REPAIR     INCISION AND DRAINAGE WOUND WITH FOREIGN BODY REMOVAL Left 12/20/2013   Procedure: INCISION AND DRAINAGE LEFT INDEX FINGER;  Surgeon: Milagros Alf, MD;  Location: [facility] ORS;  Service: Orthopedics;  Laterality: Left;   INTRAOPERATIVE TRANSESOPHAGEAL ECHOCARDIOGRAM N/A 05/19/2014   Procedure: INTRAOPERATIVE TRANSESOPHAGEAL ECHOCARDIOGRAM;  Surgeon: Gardenia Jump, MD;  Location: [facility] OR;  Service: Open Heart Surgery;  Laterality: N/A;   lamenectomy     LEFT HEART CATHETERIZATION WITH CORONARY ANGIOGRAM N/A 05/12/2014   Procedure: LEFT HEART CATHETERIZATION WITH CORONARY ANGIOGRAM;  Surgeon: Mickiel Albany, MD;  Location: [facility] CATH LAB;  Service: Cardiovascular;  Laterality: N/A;   LUMBAR FUSION     TONSILLECTOMY     VARICOSE VEIN SURGERY Left    Social History:   reports that he quit smoking about 25 years ago. His smoking use included cigarettes. He started smoking about 67 years ago. He has a 21 pack-year smoking history. He has never used smokeless tobacco. He reports that he does not drink alcohol and does not use drugs.  Family History  Problem Relation Age of Onset   Hernia Mother    Heart disease Father        smoker   COPD Father    Heart attack Father    Heart attack Brother    Early death Daughter    Colon cancer Neg Hx    Esophageal cancer Neg Hx  Pancreatic cancer Neg Hx     Medications: Patient's Medications  New Prescriptions   SERTRALINE  (ZOLOFT ) 50 MG TABLET    Take 1 tablet (50 mg total) by mouth daily.  Previous Medications   ACETAMINOPHEN  (TYLENOL ) 500 MG TABLET    Take 500 mg by mouth every 6 (six) hours as needed (pain).   ASPIRIN  81 MG EC TABLET    Take 1 tablet (81 mg total) by mouth daily.   DIVALPROEX  (DEPAKOTE ) 125 MG DR TABLET    Take 1 tab  twice a day   DONEPEZIL  (ARICEPT ) 10 MG TABLET    Take 1 tablet (10 mg total) by mouth daily.   HYDROCODONE -ACETAMINOPHEN  (NORCO/VICODIN) 5-325 MG TABLET    Take 1 tablet by mouth every 6 (six) hours as needed.   MEMANTINE  (NAMENDA ) 10 MG TABLET    Take 1 tablet  twice a day   METHIMAZOLE  (TAPAZOLE ) 5 MG TABLET    Take 0.5 tablets (2.5 mg total) by mouth  every other day.   MULTIPLE VITAMINS-MINERALS (CENTRUM SILVER PO)    Take 1 tablet by mouth daily.   ROSUVASTATIN  (CRESTOR ) 40 MG TABLET    TAKE 1 TABLET(40 MG) BY MOUTH DAILY   TAMSULOSIN  (FLOMAX ) 0.4 MG CAPS CAPSULE    TAKE 1 CAPSULE(0.4 MG) BY MOUTH TWICE DAILY  Modified Medications   No medications on file  Discontinued Medications   FLUOXETINE  (PROZAC ) 20 MG CAPSULE    Take 1 capsule (20 mg total) by mouth daily.    Physical Exam:  Vitals:   05/02/24 1333  BP: 132/84  Pulse: 81  Temp: 97.6 F (36.4 C)  SpO2: 97%  Weight: 180 lb 6.4 oz (81.8 kg)  Height: 6' 2 (1.88 m)   Body mass index is 23.16 kg/m. Wt Readings from Last 3 Encounters:  05/02/24 180 lb 6.4 oz (81.8 kg)  04/29/24 176 lb (79.8 kg)  04/25/24 176 lb (79.8 kg)    Physical Exam Constitutional:      General: He is not in acute distress.    Appearance: He is well-developed. He is not diaphoretic.  HENT:     Head: Normocephalic and atraumatic.     Right Ear: External ear normal.     Left Ear: External ear normal.     Mouth/Throat:     Pharynx: No oropharyngeal exudate.   Eyes:     Conjunctiva/sclera: Conjunctivae normal.     Pupils: Pupils are equal, round, and reactive to light.    Cardiovascular:     Rate and Rhythm: Normal rate and regular rhythm.     Heart sounds: Normal heart sounds.  Pulmonary:     Effort: Pulmonary effort is normal.     Breath sounds: Normal breath sounds.  Abdominal:     General: Bowel sounds are normal.     Palpations: Abdomen is soft.   Musculoskeletal:        General: No tenderness.     Cervical back: Normal range of motion and neck supple.     Right lower leg: No edema.     Left lower leg: No edema.   Skin:    General: Skin is warm and dry.   Neurological:     Mental Status: He is alert and oriented to person, place, and time.     Labs reviewed: Basic Metabolic Panel: Recent Labs    09/04/23 1052 01/12/24 1056 01/25/24 1149 01/27/24 0514  03/25/24 1039 04/25/24 1018  NA  --  141   < >  137 142 140  K  --  4.0   < > 4.1 4.6 4.6  CL  --  104   < > 107 106 106  CO2  --  30   < > 19* 29 24  GLUCOSE  --  93   < > 95 91 92  BUN  --  16   < > 18 11 16   CREATININE  --  0.94   < > 1.73* 0.76 0.79  CALCIUM   --  8.7   < > 8.2* 8.8 8.9  TSH 1.52 1.79  --   --  2.34  --    < > = values in this interval not displayed.   Liver Function Tests: Recent Labs    07/02/23 1207 01/12/24 1056 01/25/24 1149 03/25/24 1039  AST 22 19 37 18  ALT 17 11 22 10   ALKPHOS 69 72 65  --   BILITOT 0.4 0.5 0.9 0.5  PROT 6.5 6.6 7.3 6.3  ALBUMIN  3.8 3.7 3.5  --    Recent Labs    01/25/24 1149  LIPASE 32   No results for input(s): AMMONIA in the last 8760 hours. CBC: Recent Labs    07/02/23 1207 01/12/24 1056 01/25/24 1149 01/26/24 0444 03/25/24 1039 04/25/24 1018  WBC 6.0 5.3   < > 6.8 6.3 6.9  NEUTROABS 3.4 3.1  --   --  3,597  --   HGB 12.6* 12.3*   < > 11.2* 11.4* 12.1*  HCT 39.7 38.6*   < > 35.7* 35.6* 39.6  MCV 87.0 85.9   < > 87.9 87.3 89.2  PLT 163.0 179.0   < > 146* 188 147*   < > = values in this interval not displayed.   Lipid Panel: Recent Labs    07/02/23 1207 01/12/24 1056  CHOL 104 115  HDL 44.80 53.10  LDLCALC 35 46  TRIG 121.0 78.0  CHOLHDL 2 2   TSH: Recent Labs    09/04/23 1052 01/12/24 1056 03/25/24 1039  TSH 1.52 1.79 2.34   A1C: Lab Results  Component Value Date   HGBA1C 6.4 01/12/2024     Assessment/Plan BPH with obstruction/lower urinary tract symptoms Assessment & Plan: Recent kidney stone, followed by nephrology and on flomax .  Flomax  also could be contributing to dizziness To make sure to stay hydrated and follow up with urology this week   Hyperthyroidism Assessment & Plan: TSH at goal on tapazole . Taking every other day.    Moderate mixed vascular and neurodegenerative dementia with mood disturbance (HCC) Assessment & Plan: Progressive decline noted with increase in  behaviors to increase depakote  to twice daily, no significant changes in dizziness with increase in Depakote  per wife, may need to increase evening dose to 2 tablets if behaviors worsen.   Anxiety Assessment & Plan: Ongoing anxiety, will change from prozac  to zoloft  at this time.  May titrate up at next appt if needed. Sleep doing better at this time  Orders: -     Sertraline  HCl; Take 1 tablet (50 mg total) by mouth daily.  Dispense: 30 tablet; Refill: 1  Dizziness  Ongoing, was noted to have BPPV in 2017 by previous PCP which improved with vestibular exercises. May benefit from this but dementia is a barrier.  Worse in the last 6 months but no recent changes per wife.  -could also be medication related or part of the progressive dementia.  Typically only last for a few seconds when standing or moving quickly.  Wife encourages him to change positions slowly Making sure he maintains proper hydration.   Return in about 4 weeks (around 05/30/2024) for mood .  Karthik Whittinghill K. Denney Fisherman [facility] [phone]

## 2024-05-02 NOTE — Assessment & Plan Note (Signed)
 Ongoing anxiety, will change from prozac  to zoloft  at this time.  May titrate up at next appt if needed. Sleep doing better at this time

## 2024-05-05 DIAGNOSIS — N400 Enlarged prostate without lower urinary tract symptoms: Secondary | ICD-10-CM | POA: Diagnosis not present

## 2024-05-05 DIAGNOSIS — J449 Chronic obstructive pulmonary disease, unspecified: Secondary | ICD-10-CM | POA: Diagnosis not present

## 2024-05-06 DIAGNOSIS — N202 Calculus of kidney with calculus of ureter: Secondary | ICD-10-CM | POA: Diagnosis not present

## 2024-05-09 ENCOUNTER — Other Ambulatory Visit: Payer: Self-pay | Admitting: Licensed Clinical Social Worker

## 2024-05-09 NOTE — Patient Outreach (Signed)
 Complex Care Management   Visit Note  05/09/2024  Name:  Timothy Gallagher MRN: [id number] DOB: [birth year]  Situation: Referral received for Complex Care Management related to Mental/Behavioral Health diagnosis Anxiety I obtained verbal consent from Caregiver.  Visit completed with Spouse  on the phone  Background:   Past Medical History:  Diagnosis Date   ACNE ROSACEA 06/27/2009   ACTINIC KERATOSIS, HEAD 04/18/2009   Acute maxillary sinusitis 05/14/2010   ALLERGIC RHINITIS 04/09/2007   Anal fissure 04/07/2016   B12 DEFICIENCY 06/07/2007   BACK PAIN WITH RADICULOPATHY 04/24/2008   Bilateral nephrolithiasis 07/07/2022   CT abd 06/2022 1. Nonobstructive bilateral nephrolithiasis measuring up to 5 mm on the right and 6 mm on the left.   Cancer [facility])    skin   CHEST WALL PAIN, ACUTE 06/15/2009   Chronic maxillary sinusitis 05/29/2008   COLITIS 04/27/2009   COLONIC POLYPS, HX OF 04/27/2009   tubular adenomas   Coronary artery disease 05/12/2014   Cath 05/12/2014 w/ severe 3-vessel CAD and preserved LV function, EF 55%   DERMATITIS, ATOPIC 10/12/2007   Diverticular disease 07/07/2022   CT abd 06/2022 . Colonic diverticulosis with no acute diverticulitis.   DIVERTICULOSIS, COLON 04/27/2009   ECCHYMOSES, SPONTANEOUS 06/27/2008   Elevated sedimentation rate 05/02/2009   ESOPHAGEAL STRICTURE 04/27/2009   GASTRITIS, CHRONIC 04/27/2009   History of kidney stones    HYPERLIPIDEMIA 03/13/2008   HYPERTENSION 04/09/2007   Hyperthyroidism    Iliac aneurysm (HCC) 07/07/2022   CT abd 06/2022 . Aneurysmal left common iliac artery (1.6 cm).     Internal bleeding hemorrhoids 01/22/2015   01/22/2015 Seen at anoscopy, grade 1 all 3 positions    Irritable bowel syndrome 08/11/2007   KIDNEY DISEASE 04/09/2007   NECK PAIN 09/14/2008   NEUROPATHY, IDIOPATHIC PERIPHERAL NEC 08/11/2007   OSTEOARTHRITIS, WRIST, RIGHT 08/05/2010   Postoperative delirium 05/20/2014   S/P CABG x 4 05/19/2014   LIMA to LAD,  SVG to diag, SVG to OM, SVG to PDA, EVH via right thigh and leg    Assessment: Patient Reported Symptoms:  Cognitive Cognitive Status: Able to follow simple commands, Normal speech and language skills Cognitive/Intellectual Conditions Management [RPT]: None reported or documented in medical history or problem list   Health Maintenance Behaviors: Annual physical exam  Neurological Neurological Review of Symptoms: Dizziness Neurological Conditions: Dementia Neurological Management Strategies: Adequate rest, Coping strategies, Medication therapy, Routine screening  HEENT HEENT Symptoms Reported: No symptoms reported      Cardiovascular Cardiovascular Symptoms Reported: No symptoms reported Cardiovascular Conditions: Coronary artery disease  Respiratory Respiratory Symptoms Reported: No symptoms reported Respiratory Conditions: COPD  Endocrine Patient reports the following symptoms related to hypoglycemia or hyperglycemia : No symptoms reported Is patient diabetic?: No Endocrine Conditions: Grave's Disease  Gastrointestinal Gastrointestinal Symptoms Reported: No symptoms reported      Genitourinary Genitourinary Symptoms Reported: No symptoms reported Additional Genitourinary Details: Pt recently followed up with Urologist 06/20 Genitourinary Management Strategies: Medication therapy  Integumentary Integumentary Symptoms Reported: No symptoms reported    Musculoskeletal Musculoskelatal Symptoms Reviewed: No symptoms reported        Psychosocial Psychosocial Symptoms Reported: Anxiety - if selected complete GAD Additional Psychological Details: Family reports pt is doing well managing anxiety symptoms, pt wasn't available to conduct GAD7. Solution focused strategies discussed to assist with caregiver stress Behavioral Health Conditions: Anxiety Behavioral Management Strategies: Adequate rest, Coping strategies, Support system Major Change/Loss/Stressor/Fears (CP): Medical condition,  self Techniques to Cope with Loss/Stress/Change: Medication Quality of Family Relationships:  involved, supportive, helpful Do you feel physically threatened by others?: No      10/20/2023    2:05 PM  Depression screen PHQ 2/9  Decreased Interest 0  Down, Depressed, Hopeless 0  PHQ - 2 Score 0    There were no vitals filed for this visit.  Medications Reviewed Today     Reviewed by Katera Rybka D, LCSW (Social Worker) on 05/09/24 at 1013  Med List Status: <None>   Medication Order Taking? Sig Documenting Provider Last Dose Status Informant  acetaminophen  (TYLENOL ) 500 MG tablet [id number] Yes Take 500 mg by mouth every 6 (six) hours as needed (pain). [provider]  Active Spouse/Significant Other  aspirin  81 MG EC tablet [id number] Yes Take 1 tablet (81 mg total) by mouth daily. Claudene Victory ORN, MD  Active Spouse/Significant Other  divalproex  (DEPAKOTE ) 125 MG DR tablet [id number] Yes Take 1 tab  twice a day Wertman, Sara E, PA-C  Active Spouse/Significant Other  donepezil  (ARICEPT ) 10 MG tablet [id number] Yes Take 1 tablet (10 mg total) by mouth daily. Wertman, Sara E, PA-C  Active Spouse/Significant Other  HYDROcodone -acetaminophen  (NORCO/VICODIN) 5-325 MG tablet [id number] Yes Take 1 tablet by mouth every 6 (six) hours as needed. Watt Rush, MD  Active   memantine  (NAMENDA ) 10 MG tablet [id number] Yes Take 1 tablet  twice a day Wertman, Sara E, PA-C  Active Spouse/Significant Other  methimazole  (TAPAZOLE ) 5 MG tablet [id number] Yes Take 0.5 tablets (2.5 mg total) by mouth every other day. Caro Harlene POUR, NP  Active Spouse/Significant Other  Multiple Vitamins-Minerals (CENTRUM SILVER PO) [id number] Yes Take 1 tablet by mouth daily. [provider]  Active Spouse/Significant Other  rosuvastatin  (CRESTOR ) 40 MG tablet [id number] Yes TAKE 1 TABLET(40 MG) BY MOUTH DAILY [name], MD  Active Spouse/Significant Other  sertraline  (ZOLOFT ) 50 MG tablet [id number] Yes  Take 1 tablet (50 mg total) by mouth daily. Caro Harlene POUR, NP  Active   tamsulosin  (FLOMAX ) 0.4 MG CAPS capsule [id number] Yes TAKE 1 CAPSULE(0.4 MG) BY MOUTH TWICE DAILY Caro, Jessica K, NP  Active Spouse/Significant Other            Recommendation:   Continue Current Plan of Care  Follow Up Plan:   Telephone follow-up 3 weeks  Rolin Kerns, LCSW [facility]  [facility], [facility] Clinical Social Worker Direct Dial: [phone]  Fax: [phone] Website: [email or web address] 10:30 AM

## 2024-05-09 NOTE — Patient Instructions (Signed)
 Visit Information  Thank you for taking time to visit with me today. Please don't hesitate to contact me if I can be of assistance to you before our next scheduled appointment.  Our next appointment is by telephone on 07/14 at 9:30 AM Please call the care guide team at [phone] if you need to cancel or reschedule your appointment.   Following is a copy of your care plan:   Goals Addressed             This Visit's Progress    LCSW VBCI Social Work Care Plan   On track    Problems:   Disease Management support and education needs related to Anxiety with Excessive Worry,  CSW Clinical Goal(s):   Over the next 60 days the Patient will attend all scheduled medical appointments as evidenced by patient report and care team review of appointment completion in electronic MEDICAL RECORD NUMBER  demonstrate a reduction in symptoms related to Anxiety with Excessive Worry, .  Interventions:  Mental Health:  Evaluation of current treatment plan related to Anxiety with Excessive Worry, Active listening / Reflection utilized Caregiver stress acknowledged :Spouse denies need for any resources at this time [employer] / information provided Discussed caregiver resources and support: Spouse states she and pt receives strong support from their adult daughter Emotional Support Provided Provided general psycho-education for mental health needs Reviewed mental health medications and discussed importance of compliance: Spouse reports compliance with medications, noting some were recently adjusted by PCP [facility] Strategies employed: Self-care encouraged  Patient Goals/Self-Care Activities:  Continue taking your medication as prescribed.   Increase coping skills  Plan:   Telephone follow up appointment with care management team member scheduled for:  3 weeks        Please call the Suicide and Crisis Lifeline: 988 call 1-800-273-TALK (toll free, 24 hour hotline) call  911 if you are experiencing a Mental Health or Behavioral Health Crisis or need someone to talk to.  Patient verbalizes understanding of instructions and care plan provided today and agrees to view in MyChart. Active MyChart status and patient understanding of how to access instructions and care plan via MyChart confirmed with patient.     Rolin Ezzard HUGHS [facility]  [facility], [facility] Clinical Social Worker Direct Dial: [phone]  Fax: [phone] Website: [email or web address] 10:30 AM

## 2024-05-13 DIAGNOSIS — K080 Exfoliation of teeth due to systemic causes: Secondary | ICD-10-CM | POA: Diagnosis not present

## 2024-05-30 ENCOUNTER — Other Ambulatory Visit: Payer: Self-pay | Admitting: Licensed Clinical Social Worker

## 2024-05-30 DIAGNOSIS — N200 Calculus of kidney: Secondary | ICD-10-CM | POA: Diagnosis not present

## 2024-05-30 NOTE — Patient Outreach (Signed)
 Complex Care Management   Visit Note  05/30/2024  Name:  Timothy Gallagher MRN: [id number] DOB: 01-19-1936  Situation: Referral received for Complex Care Management related to Mental/Behavioral Health diagnosis Anxiety I obtained verbal consent from Caregiver.  Visit completed with spouse  on the phone  Background:   Past Medical History:  Diagnosis Date   ACNE ROSACEA 06/27/2009   ACTINIC KERATOSIS, HEAD 04/18/2009   Acute maxillary sinusitis 05/14/2010   ALLERGIC RHINITIS 04/09/2007   Anal fissure 04/07/2016   B12 DEFICIENCY 06/07/2007   BACK PAIN WITH RADICULOPATHY 04/24/2008   Bilateral nephrolithiasis 07/07/2022   CT abd 06/2022 1. Nonobstructive bilateral nephrolithiasis measuring up to 5 mm on the right and 6 mm on the left.   Cancer [facility])    skin   CHEST WALL PAIN, ACUTE 06/15/2009   Chronic maxillary sinusitis 05/29/2008   COLITIS 04/27/2009   COLONIC POLYPS, HX OF 04/27/2009   tubular adenomas   Coronary artery disease 05/12/2014   Cath 05/12/2014 w/ severe 3-vessel CAD and preserved LV function, EF 55%   DERMATITIS, ATOPIC 10/12/2007   Diverticular disease 07/07/2022   CT abd 06/2022 . Colonic diverticulosis with no acute diverticulitis.   DIVERTICULOSIS, COLON 04/27/2009   ECCHYMOSES, SPONTANEOUS 06/27/2008   Elevated sedimentation rate 05/02/2009   ESOPHAGEAL STRICTURE 04/27/2009   GASTRITIS, CHRONIC 04/27/2009   History of kidney stones    HYPERLIPIDEMIA 03/13/2008   HYPERTENSION 04/09/2007   Hyperthyroidism    Iliac aneurysm (HCC) 07/07/2022   CT abd 06/2022 . Aneurysmal left common iliac artery (1.6 cm).     Internal bleeding hemorrhoids 01/22/2015   01/22/2015 Seen at anoscopy, grade 1 all 3 positions    Irritable bowel syndrome 08/11/2007   KIDNEY DISEASE 04/09/2007   NECK PAIN 09/14/2008   NEUROPATHY, IDIOPATHIC PERIPHERAL NEC 08/11/2007   OSTEOARTHRITIS, WRIST, RIGHT 08/05/2010   Postoperative delirium 05/20/2014   S/P CABG x 4 05/19/2014   LIMA to LAD,  SVG to diag, SVG to OM, SVG to PDA, EVH via right thigh and leg    Assessment: Patient Reported Symptoms:  Cognitive Cognitive Status: No symptoms reported, Alert and oriented to person, place, and time, Normal speech and language skills Cognitive/Intellectual Conditions Management [RPT]: None reported or documented in medical history or problem list      Neurological Neurological Review of Symptoms: Not assessed    HEENT HEENT Symptoms Reported: No symptoms reported      Cardiovascular Cardiovascular Symptoms Reported: No symptoms reported    Respiratory Respiratory Symptoms Reported: No symptoms reported    Endocrine Endocrine Symptoms Reported: No symptoms reported    Gastrointestinal Gastrointestinal Symptoms Reported: No symptoms reported      Genitourinary Genitourinary Symptoms Reported: No symptoms reported Additional Genitourinary Details: Spouse reports pt is no longer having any kidney stones or pain since surgery procedure    Integumentary Integumentary Symptoms Reported: No symptoms reported    Musculoskeletal Musculoskelatal Symptoms Reviewed: Back pain Additional Musculoskeletal Details: Patient endorses occassional back pain. Symptoms are managed with OTC medication Musculoskeletal Management Strategies: Adequate rest, Medication therapy, Routine screening      Psychosocial Psychosocial Symptoms Reported: No symptoms reported Behavioral Management Strategies: Adequate rest, Support system, Coping strategies Major Change/Loss/Stressor/Fears (CP): Medical condition, self        10/20/2023    2:05 PM  Depression screen PHQ 2/9  Decreased Interest 0  Down, Depressed, Hopeless 0  PHQ - 2 Score 0    There were no vitals filed for this visit.  Medications Reviewed  Today     Reviewed by Ezzard Rolin BIRCH, LCSW (Social Worker) on 05/30/24 at [id number]  Med List Status: <None>   Medication Order Taking? Sig Documenting Provider Last Dose Status Informant   acetaminophen  (TYLENOL ) 500 MG tablet [id number]  Take 500 mg by mouth every 6 (six) hours as needed (pain). [provider]  Active Spouse/Significant Other  aspirin  81 MG EC tablet [id number]  Take 1 tablet (81 mg total) by mouth daily. Claudene Victory ORN, MD  Active Spouse/Significant Other  divalproex  (DEPAKOTE ) 125 MG DR tablet [id number]  Take 1 tab  twice a day Wertman, Sara E, PA-C  Active Spouse/Significant Other  donepezil  (ARICEPT ) 10 MG tablet [id number]  Take 1 tablet (10 mg total) by mouth daily. Wertman, Sara E, PA-C  Active Spouse/Significant Other  HYDROcodone -acetaminophen  (NORCO/VICODIN) 5-325 MG tablet [id number]  Take 1 tablet by mouth every 6 (six) hours as needed. Watt Rush, MD  Active   memantine  (NAMENDA ) 10 MG tablet [id number]  Take 1 tablet  twice a day Wertman, Sara E, PA-C  Active Spouse/Significant Other  methimazole  (TAPAZOLE ) 5 MG tablet [id number]  Take 0.5 tablets (2.5 mg total) by mouth every other day. Caro Harlene POUR, NP  Active Spouse/Significant Other  Multiple Vitamins-Minerals (CENTRUM SILVER PO) [id number]  Take 1 tablet by mouth daily. [provider]  Active Spouse/Significant Other  rosuvastatin  (CRESTOR ) 40 MG tablet [id number]  TAKE 1 TABLET(40 MG) BY MOUTH DAILY [name], MD  Active Spouse/Significant Other  sertraline  (ZOLOFT ) 50 MG tablet [id number]  Take 1 tablet (50 mg total) by mouth daily. Caro Harlene POUR, NP  Active   tamsulosin  (FLOMAX ) 0.4 MG CAPS capsule [id number]  TAKE 1 CAPSULE(0.4 MG) BY MOUTH TWICE DAILY Caro Jessica K, NP  Active Spouse/Significant Other            Recommendation:   PCP Follow-up appt scheduled 06/27/24 Continue Current Plan of Care  Follow Up Plan:   Telephone follow-up in 1 month  Rolin Ezzard, LCSW [place]  [facility], [facility] Clinical Social Worker Direct Dial: [phone]  Fax: [phone] Website: [email or web address] 9:43 AM

## 2024-05-30 NOTE — Patient Instructions (Signed)
 Visit Information  Thank you for taking time to visit with me today. Please don't hesitate to contact me if I can be of assistance to you before our next scheduled appointment.  Your next care management appointment is by telephone on 8/18 at 9:30 AM  Please call the care guide team at [phone] if you need to cancel, schedule, or reschedule an appointment.   Please call the Suicide and Crisis Lifeline: 988 go to [facility] [address] [place] [phone]) call 911 if you are experiencing a Mental Health or Behavioral Health Crisis or need someone to talk to.  Rolin Kerns, LCSW [place]  [facility], [facility] Clinical Social Worker Direct Dial: [phone]  Fax: [phone] Website: [email or web address] 9:44 AM

## 2024-06-15 ENCOUNTER — Encounter: Payer: Self-pay | Admitting: Pharmacist

## 2024-06-15 NOTE — Progress Notes (Signed)
 Pharmacy Quality Measure Review  This patient is appearing on a report for being at risk of failing the adherence measure for hypertension (ACEi/ARB) medications this calendar year.   Medication: valsartan  Last fill date: 01/18/2024 for 90 day supply  No action needed at this time - valsartan  was discontinued at discharge 01/25/2024 due to low blood pressure and acute renal insufficiancy  Madelin Ray, PharmD Clinical Pharmacist [facility]  Population Health [phone]

## 2024-06-27 ENCOUNTER — Ambulatory Visit (INDEPENDENT_AMBULATORY_CARE_PROVIDER_SITE_OTHER): Admitting: Nurse Practitioner

## 2024-06-27 ENCOUNTER — Encounter: Payer: Self-pay | Admitting: Nurse Practitioner

## 2024-06-27 VITALS — BP 100/82 | HR 68 | Temp 97.6°F | Resp 11 | Ht 74.0 in | Wt 180.0 lb

## 2024-06-27 DIAGNOSIS — N401 Enlarged prostate with lower urinary tract symptoms: Secondary | ICD-10-CM | POA: Diagnosis not present

## 2024-06-27 DIAGNOSIS — F01B3 Vascular dementia, moderate, with mood disturbance: Secondary | ICD-10-CM

## 2024-06-27 DIAGNOSIS — F419 Anxiety disorder, unspecified: Secondary | ICD-10-CM | POA: Diagnosis not present

## 2024-06-27 DIAGNOSIS — H8110 Benign paroxysmal vertigo, unspecified ear: Secondary | ICD-10-CM

## 2024-06-27 DIAGNOSIS — E0590 Thyrotoxicosis, unspecified without thyrotoxic crisis or storm: Secondary | ICD-10-CM

## 2024-06-27 DIAGNOSIS — R739 Hyperglycemia, unspecified: Secondary | ICD-10-CM

## 2024-06-27 DIAGNOSIS — N138 Other obstructive and reflux uropathy: Secondary | ICD-10-CM

## 2024-06-27 DIAGNOSIS — I25810 Atherosclerosis of coronary artery bypass graft(s) without angina pectoris: Secondary | ICD-10-CM | POA: Diagnosis not present

## 2024-06-27 MED ORDER — SERTRALINE HCL 100 MG PO TABS
100.0000 mg | ORAL_TABLET | Freq: Every day | ORAL | 1 refills | Status: AC
Start: 2024-06-27 — End: ?

## 2024-06-27 NOTE — Assessment & Plan Note (Signed)
 Ongoing anxiety, dong better on zoloft , will increase to 100 mg daily Sleep has also improved per wife.

## 2024-06-27 NOTE — Assessment & Plan Note (Signed)
 Noted on labs, will follow.

## 2024-06-27 NOTE — Assessment & Plan Note (Signed)
 Stable, no acute changes in cognitive or functional status, continue supportive care with family Continues on namenda  and aricept  Behaviors stable per wife.  Continues on Depakote  BID.

## 2024-06-27 NOTE — Assessment & Plan Note (Signed)
 Continues to follow up with endocrine, TSH stable on last labs.

## 2024-06-27 NOTE — Progress Notes (Signed)
 Careteam: Patient Care Team: Caro Harlene POUR, NP as PCP - General (Geriatric Medicine) Jeffrie Oneil BROCKS, MD as PCP - Cardiology (Cardiology) Gaston Hamilton, MD as Consulting Physician (Urology) Regenia, Prentice Clack, MD as Consulting Physician (Ophthalmology) Anner Alm ORN, MD as Consulting Physician (Cardiology) Dina Camie FORBES DEVONNA (Neurology) Trixie File, MD as Consulting Physician (Internal Medicine) Ezzard Rolin BIRCH, LCSW as VBCI Care Management (Licensed Clinical Social Worker)  PLACE OF SERVICE:  [facility]  Advanced Directive information    Allergies  Allergen Reactions   Lipitor  [Atorvastatin ] Other (See Comments)    REACTION: nausea and blurred vision   Trazodone  And Nefazodone Other (See Comments)    dizzy   Ciprofloxacin  Swelling   Mycophenolate Mofetil Other (See Comments)    REACTION: unspecified   Amoxicillin Rash   Penicillins Rash    Chief Complaint  Patient presents with   Medical Management of Chronic Issues    Routine follow up 3 months     HPI:  Discussed the use of AI scribe software for clinical note transcription with the patient, who gave verbal consent to proceed.  History of Present Illness Timothy Gallagher is an 88 year old male who presents for a routine follow-up.  He is here with wife, he has dementia He is generally doing well with no major concerns. He experiences good sleep but wakes up two to three times at night to urinate. He reports being in a good mood, and previous issues with sleep and evening irritation persist to some extent.  He takes sertraline  daily, which was changed at the last appointment, along with Namenda  and Depakote  twice daily for evening agitation. He is not taking Prozac .   He experiences dizziness, particularly when getting up quickly, but denies any falls.   He is on Flomax  for urinary symptoms, taken at night  No new chest pains, although he has a history of CAD.   He is seeing an  endocrinologist for hypothyroidism and has an appointment scheduled in October.   Review of Systems:  Review of Systems  Unable to perform ROS: Dementia    Past Medical History:  Diagnosis Date   ACNE ROSACEA 06/27/2009   ACTINIC KERATOSIS, HEAD 04/18/2009   Acute maxillary sinusitis 05/14/2010   ALLERGIC RHINITIS 04/09/2007   Anal fissure 04/07/2016   B12 DEFICIENCY 06/07/2007   BACK PAIN WITH RADICULOPATHY 04/24/2008   Bilateral nephrolithiasis 07/07/2022   CT abd 06/2022 1. Nonobstructive bilateral nephrolithiasis measuring up to 5 mm on the right and 6 mm on the left.   Cancer [facility])    skin   CHEST WALL PAIN, ACUTE 06/15/2009   Chronic maxillary sinusitis 05/29/2008   COLITIS 04/27/2009   COLONIC POLYPS, HX OF 04/27/2009   tubular adenomas   Coronary artery disease 05/12/2014   Cath 05/12/2014 w/ severe 3-vessel CAD and preserved LV function, EF 55%   DERMATITIS, ATOPIC 10/12/2007   Diverticular disease 07/07/2022   CT abd 06/2022 . Colonic diverticulosis with no acute diverticulitis.   DIVERTICULOSIS, COLON 04/27/2009   ECCHYMOSES, SPONTANEOUS 06/27/2008   Elevated sedimentation rate 05/02/2009   ESOPHAGEAL STRICTURE 04/27/2009   GASTRITIS, CHRONIC 04/27/2009   History of kidney stones    HYPERLIPIDEMIA 03/13/2008   HYPERTENSION 04/09/2007   Hyperthyroidism    Iliac aneurysm (HCC) 07/07/2022   CT abd 06/2022 . Aneurysmal left common iliac artery (1.6 cm).     Internal bleeding hemorrhoids 01/22/2015   01/22/2015 Seen at anoscopy, grade 1 all 3 positions  Irritable bowel syndrome 08/11/2007   KIDNEY DISEASE 04/09/2007   NECK PAIN 09/14/2008   NEUROPATHY, IDIOPATHIC PERIPHERAL NEC 08/11/2007   OSTEOARTHRITIS, WRIST, RIGHT 08/05/2010   Postoperative delirium 05/20/2014   S/P CABG x 4 05/19/2014   LIMA to LAD, SVG to diag, SVG to OM, SVG to PDA, EVH via right thigh and leg   Past Surgical History:  Procedure Laterality Date   CARDIAC CATHETERIZATION      COLONOSCOPY     CORONARY ARTERY BYPASS GRAFT N/A 05/19/2014   Procedure: CORONARY ARTERY BYPASS GRAFTING (CABG);  Surgeon: Sudie VEAR Laine, MD;  Location: [facility] OR;  Service: Open Heart Surgery;  Laterality: N/A;  Times 4 using left internal mammary artery and endoscopically harvested right saphenous vein   CYSTOSCOPY/URETEROSCOPY/HOLMIUM LASER/STENT PLACEMENT Bilateral 04/29/2024   Procedure: CYSTOSCOPY/URETEROSCOPY/HOLMIUM LASER/STENT PLACEMENT;  Surgeon: Watt Rush, MD;  Location: [facility] ORS;  Service: Urology;  Laterality: Bilateral;  CYSTOSCOPY/RIGHT URETEROSCOPY/HOLMIUM LASER/STENT PLACEMENT/RETROGRADE PYELOGRAM   ESOPHAGOGASTRODUODENOSCOPY     EXTRACORPOREAL SHOCK WAVE LITHOTRIPSY Right 01/29/2024   Procedure: LITHOTRIPSY, ESWL;  Surgeon: Lovie Arlyss CROME, MD;  Location: [facility] ORS;  Service: Urology;  Laterality: Right;   EYE SURGERY     FINGER SURGERY     cut off end of finger   FLEXIBLE SIGMOIDOSCOPY     HEMORRHOID BANDING     HERNIA REPAIR     INCISION AND DRAINAGE WOUND WITH FOREIGN BODY REMOVAL Left 12/20/2013   Procedure: INCISION AND DRAINAGE LEFT INDEX FINGER;  Surgeon: Franky JONELLE Curia, MD;  Location: [facility] ORS;  Service: Orthopedics;  Laterality: Left;   INTRAOPERATIVE TRANSESOPHAGEAL ECHOCARDIOGRAM N/A 05/19/2014   Procedure: INTRAOPERATIVE TRANSESOPHAGEAL ECHOCARDIOGRAM;  Surgeon: Sudie VEAR Laine, MD;  Location: [facility] OR;  Service: Open Heart Surgery;  Laterality: N/A;   lamenectomy     LEFT HEART CATHETERIZATION WITH CORONARY ANGIOGRAM N/A 05/12/2014   Procedure: LEFT HEART CATHETERIZATION WITH CORONARY ANGIOGRAM;  Surgeon: Victory LELON Claudene DOUGLAS, MD;  Location[facility] CATH LAB;  Service: Cardiovascular;  Laterality: N/A;   LUMBAR FUSION     TONSILLECTOMY     VARICOSE VEIN SURGERY Left    Social History:   reports that he quit smoking about 25 years ago. His smoking use included cigarettes. He started smoking about 67 years ago. He has a 21 pack-year smoking history. He has never used smokeless  tobacco. He reports that he does not drink alcohol and does not use drugs.  Family History  Problem Relation Age of Onset   Hernia Mother    Heart disease Father        smoker   COPD Father    Heart attack Father    Heart attack Brother    Early death Daughter    Colon cancer Neg Hx    Esophageal cancer Neg Hx    Pancreatic cancer Neg Hx     Medications: Patient's Medications  New Prescriptions   No medications on file  Previous Medications   ACETAMINOPHEN  (TYLENOL ) 500 MG TABLET    Take 500 mg by mouth every 6 (six) hours as needed (pain).   ASPIRIN  81 MG EC TABLET    Take 1 tablet (81 mg total) by mouth daily.   DIVALPROEX  (DEPAKOTE ) 125 MG DR TABLET    Take 1 tab  twice a day   DONEPEZIL  (ARICEPT ) 10 MG TABLET    Take 1 tablet (10 mg total) by mouth daily.   FLUOXETINE  (PROZAC ) 20 MG CAPSULE    Take 20 mg by mouth daily.  HYDROCODONE -ACETAMINOPHEN  (NORCO/VICODIN) 5-325 MG TABLET    Take 1 tablet by mouth every 6 (six) hours as needed.   MEMANTINE  (NAMENDA ) 10 MG TABLET    Take 1 tablet  twice a day   METHIMAZOLE  (TAPAZOLE ) 5 MG TABLET    Take 0.5 tablets (2.5 mg total) by mouth every other day.   MULTIPLE VITAMINS-MINERALS (CENTRUM SILVER PO)    Take 1 tablet by mouth daily.   ROSUVASTATIN  (CRESTOR ) 40 MG TABLET    TAKE 1 TABLET(40 MG) BY MOUTH DAILY   SERTRALINE  (ZOLOFT ) 50 MG TABLET    Take 1 tablet (50 mg total) by mouth daily.   TAMSULOSIN  (FLOMAX ) 0.4 MG CAPS CAPSULE    TAKE 1 CAPSULE(0.4 MG) BY MOUTH TWICE DAILY  Modified Medications   No medications on file  Discontinued Medications   No medications on file    Physical Exam:  Vitals:   06/27/24 1040  BP: 100/82  Pulse: 68  Resp: 11  Temp: 97.6 F (36.4 C)  SpO2: 96%  Weight: 180 lb (81.6 kg)  Height: 6' 2 (1.88 m)   Body mass index is 23.11 kg/m. Wt Readings from Last 3 Encounters:  06/27/24 180 lb (81.6 kg)  05/02/24 180 lb 6.4 oz (81.8 kg)  04/29/24 176 lb (79.8 kg)    Physical  Exam Constitutional:      General: He is not in acute distress.    Appearance: He is well-developed. He is not diaphoretic.  HENT:     Head: Normocephalic and atraumatic.     Right Ear: External ear normal.     Left Ear: External ear normal.     Mouth/Throat:     Pharynx: No oropharyngeal exudate.  Eyes:     Conjunctiva/sclera: Conjunctivae normal.     Pupils: Pupils are equal, round, and reactive to light.  Cardiovascular:     Rate and Rhythm: Normal rate and regular rhythm.     Heart sounds: Normal heart sounds.  Pulmonary:     Effort: Pulmonary effort is normal.     Breath sounds: Normal breath sounds.  Abdominal:     General: Bowel sounds are normal.     Palpations: Abdomen is soft.  Musculoskeletal:        General: No tenderness.     Cervical back: Normal range of motion and neck supple.     Right lower leg: No edema.     Left lower leg: No edema.  Skin:    General: Skin is warm and dry.  Neurological:     Mental Status: He is alert. Mental status is at baseline. He is disoriented.     Labs reviewed: Basic Metabolic Panel: Recent Labs    09/04/23 1052 01/12/24 1056 01/25/24 1149 01/27/24 0514 03/25/24 1039 04/25/24 1018  NA  --  141   < > 137 142 140  K  --  4.0   < > 4.1 4.6 4.6  CL  --  104   < > 107 106 106  CO2  --  30   < > 19* 29 24  GLUCOSE  --  93   < > 95 91 92  BUN  --  16   < > 18 11 16   CREATININE  --  0.94   < > 1.73* 0.76 0.79  CALCIUM   --  8.7   < > 8.2* 8.8 8.9  TSH 1.52 1.79  --   --  2.34  --    < > = values in  this interval not displayed.   Liver Function Tests: Recent Labs    07/02/23 1207 01/12/24 1056 01/25/24 1149 03/25/24 1039  AST 22 19 37 18  ALT 17 11 22 10   ALKPHOS 69 72 65  --   BILITOT 0.4 0.5 0.9 0.5  PROT 6.5 6.6 7.3 6.3  ALBUMIN  3.8 3.7 3.5  --    Recent Labs    01/25/24 1149  LIPASE 32   No results for input(s): AMMONIA in the last 8760 hours. CBC: Recent Labs    07/02/23 1207 01/12/24 1056  01/25/24 1149 01/26/24 0444 03/25/24 1039 04/25/24 1018  WBC 6.0 5.3   < > 6.8 6.3 6.9  NEUTROABS 3.4 3.1  --   --  3,597  --   HGB 12.6* 12.3*   < > 11.2* 11.4* 12.1*  HCT 39.7 38.6*   < > 35.7* 35.6* 39.6  MCV 87.0 85.9   < > 87.9 87.3 89.2  PLT 163.0 179.0   < > 146* 188 147*   < > = values in this interval not displayed.   Lipid Panel: Recent Labs    07/02/23 1207 01/12/24 1056  CHOL 104 115  HDL 44.80 53.10  LDLCALC 35 46  TRIG 121.0 78.0  CHOLHDL 2 2   TSH: Recent Labs    09/04/23 1052 01/12/24 1056 03/25/24 1039  TSH 1.52 1.79 2.34   A1C: Lab Results  Component Value Date   HGBA1C 6.4 01/12/2024     Assessment/Plan  Moderate mixed vascular and neurodegenerative dementia with mood disturbance (HCC) Assessment & Plan: Stable, no acute changes in cognitive or functional status, continue supportive care with family Continues on namenda  and aricept  Behaviors stable per wife.  Continues on Depakote  BID.    Anxiety Assessment & Plan: Ongoing anxiety, dong better on zoloft , will increase to 100 mg daily Sleep has also improved per wife.   Orders: -     Sertraline  HCl; Take 1 tablet (100 mg total) by mouth daily.  Dispense: 90 tablet; Refill: 1  BPH with obstruction/lower urinary tract symptoms Assessment & Plan: Stable on flomax , continue at bedtime.    Coronary artery disease involving coronary bypass graft of native heart without angina pectoris Assessment & Plan: Stable without chest pains. Continues on ASA daily  Orders: -     CBC with Differential/Platelet -     Basic Metabolic Panel Without GFR  Hyperthyroidism Assessment & Plan: Continues to follow up with endocrine, TSH stable on last labs.    Benign paroxysmal positional vertigo, unspecified laterality Assessment & Plan: Stable at this time, encouraged changing positions slowly but due to dementia unable to follow.  Continue supportive care.  Maintain proper hydration     Hyperglycemia Assessment & Plan: Noted on labs, will follow.   Orders: -     Hemoglobin A1c   Return in about 5 months (around 11/27/2024) for routine follow up.  Makyna Niehoff K. Caro BODILY [facility] [phone]

## 2024-06-27 NOTE — Assessment & Plan Note (Signed)
 Stable on flomax , continue at bedtime.

## 2024-06-27 NOTE — Assessment & Plan Note (Signed)
 Stable without chest pains. Continues on ASA daily

## 2024-06-27 NOTE — Assessment & Plan Note (Signed)
 Stable at this time, encouraged changing positions slowly but due to dementia unable to follow.  Continue supportive care.  Maintain proper hydration

## 2024-06-28 ENCOUNTER — Ambulatory Visit: Payer: Self-pay | Admitting: Nurse Practitioner

## 2024-06-28 LAB — CBC WITH DIFFERENTIAL/PLATELET
Absolute Lymphocytes: 1595 {cells}/uL (ref 850–3900)
Absolute Monocytes: 512 {cells}/uL (ref 200–950)
Basophils Absolute: 28 {cells}/uL (ref 0–200)
Basophils Relative: 0.5 %
Eosinophils Absolute: 193 {cells}/uL (ref 15–500)
Eosinophils Relative: 3.5 %
HCT: 38.5 % (ref 38.5–50.0)
Hemoglobin: 12 g/dL — ABNORMAL LOW (ref 13.2–17.1)
MCH: 27 pg (ref 27.0–33.0)
MCHC: 31.2 g/dL — ABNORMAL LOW (ref 32.0–36.0)
MCV: 86.7 fL (ref 80.0–100.0)
MPV: 10.7 fL (ref 7.5–12.5)
Monocytes Relative: 9.3 %
Neutro Abs: 3174 {cells}/uL (ref 1500–7800)
Neutrophils Relative %: 57.7 %
Platelets: 176 Thousand/uL (ref 140–400)
RBC: 4.44 Million/uL (ref 4.20–5.80)
RDW: 13.9 % (ref 11.0–15.0)
Total Lymphocyte: 29 %
WBC: 5.5 Thousand/uL (ref 3.8–10.8)

## 2024-06-28 LAB — BASIC METABOLIC PANEL WITHOUT GFR
BUN: 14 mg/dL (ref 7–25)
CO2: 29 mmol/L (ref 20–32)
Calcium: 8.8 mg/dL (ref 8.6–10.3)
Chloride: 107 mmol/L (ref 98–110)
Creat: 0.92 mg/dL (ref 0.70–1.22)
Glucose, Bld: 92 mg/dL (ref 65–99)
Potassium: 4.6 mmol/L (ref 3.5–5.3)
Sodium: 143 mmol/L (ref 135–146)

## 2024-06-28 LAB — HEMOGLOBIN A1C
Hgb A1c MFr Bld: 6.2 % — ABNORMAL HIGH (ref <5.7)
Mean Plasma Glucose: 131 mg/dL
eAG (mmol/L): 7.3 mmol/L

## 2024-07-04 ENCOUNTER — Other Ambulatory Visit: Payer: Self-pay | Admitting: Nurse Practitioner

## 2024-07-04 ENCOUNTER — Other Ambulatory Visit: Payer: Self-pay | Admitting: Licensed Clinical Social Worker

## 2024-07-04 DIAGNOSIS — F419 Anxiety disorder, unspecified: Secondary | ICD-10-CM

## 2024-07-04 NOTE — Patient Instructions (Signed)
 Visit Information  Thank you for taking time to visit with me today. Please don't hesitate to contact me if I can be of assistance to you before our next scheduled appointment.  Closing From: Complex Care Management.  Please call the care guide team at [phone] if you need to cancel, schedule, or reschedule an appointment.   Please call the Suicide and Crisis Lifeline: 988 go to [facility] [address] [place] [phone]) call 911 if you are experiencing a Mental Health or Behavioral Health Crisis or need someone to talk to.  Rolin Kerns, LCSW [facility], [facility] Clinical Social Worker Direct Dial: [phone]  Fax: [phone] Website: [email or web address] 9:44 AM

## 2024-07-04 NOTE — Patient Outreach (Signed)
 Complex Care Management   Visit Note  07/04/2024  Name:  Timothy Gallagher MRN: [id number] DOB: [birth year]  Situation: Referral received for Complex Care Management related to Mental/Behavioral Health diagnosis Anxiety and Dementia I obtained verbal consent from Timothy Gallagher.  Visit completed with pt's spouse, Timothy Gallagher  on the phone  Background:   Past Medical History:  Diagnosis Date   ACNE ROSACEA 06/27/2009   ACTINIC KERATOSIS, HEAD 04/18/2009   Acute maxillary sinusitis 05/14/2010   ALLERGIC RHINITIS 04/09/2007   Anal fissure 04/07/2016   B12 DEFICIENCY 06/07/2007   BACK PAIN WITH RADICULOPATHY 04/24/2008   Bilateral nephrolithiasis 07/07/2022   CT abd 06/2022 1. Nonobstructive bilateral nephrolithiasis measuring up to 5 mm on the right and 6 mm on the left.   Cancer [facility])    skin   CHEST WALL PAIN, ACUTE 06/15/2009   Chronic maxillary sinusitis 05/29/2008   COLITIS 04/27/2009   COLONIC POLYPS, HX OF 04/27/2009   tubular adenomas   Coronary artery disease 05/12/2014   Cath 05/12/2014 w/ severe 3-vessel CAD and preserved LV function, EF 55%   DERMATITIS, ATOPIC 10/12/2007   Diverticular disease 07/07/2022   CT abd 06/2022 . Colonic diverticulosis with no acute diverticulitis.   DIVERTICULOSIS, COLON 04/27/2009   ECCHYMOSES, SPONTANEOUS 06/27/2008   Elevated sedimentation rate 05/02/2009   ESOPHAGEAL STRICTURE 04/27/2009   GASTRITIS, CHRONIC 04/27/2009   History of kidney stones    HYPERLIPIDEMIA 03/13/2008   HYPERTENSION 04/09/2007   Hyperthyroidism    Iliac aneurysm (HCC) 07/07/2022   CT abd 06/2022 . Aneurysmal left common iliac artery (1.6 cm).     Internal bleeding hemorrhoids 01/22/2015   01/22/2015 Seen at anoscopy, grade 1 all 3 positions    Irritable bowel syndrome 08/11/2007   KIDNEY DISEASE 04/09/2007   NECK PAIN 09/14/2008   NEUROPATHY, IDIOPATHIC PERIPHERAL NEC 08/11/2007   OSTEOARTHRITIS, WRIST, RIGHT 08/05/2010   Postoperative delirium 05/20/2014   S/P CABG x 4  05/19/2014   LIMA to LAD, SVG to diag, SVG to OM, SVG to PDA, EVH via right thigh and leg    Assessment: Patient Reported Symptoms:  Cognitive Cognitive Status: Normal speech and language skills Cognitive/Intellectual Conditions Management [RPT]:  (Dementia)   Health Maintenance Behaviors: Annual physical exam  Neurological Neurological Review of Symptoms: No symptoms reported Neurological Management Strategies: Adequate rest, Coping strategies, Medication therapy, Routine screening  HEENT HEENT Symptoms Reported: No symptoms reported      Cardiovascular Cardiovascular Symptoms Reported: No symptoms reported    Respiratory Respiratory Symptoms Reported: No symptoms reported    Endocrine Endocrine Symptoms Reported: No symptoms reported    Gastrointestinal Gastrointestinal Symptoms Reported: No symptoms reported      Genitourinary Genitourinary Symptoms Reported: No symptoms reported    Integumentary Integumentary Symptoms Reported: No symptoms reported    Musculoskeletal Musculoskelatal Symptoms Reviewed: No symptoms reported        Psychosocial Psychosocial Symptoms Reported: No symptoms reported Behavioral Management Strategies: Adequate rest, Support system, Coping strategies Major Change/Loss/Stressor/Fears (CP): Medical condition, self Techniques to Cope with Loss/Stress/Change: Medication Quality of Family Relationships: helpful, involved, supportive Do you feel physically threatened by others?: No      10/20/2023    2:05 PM  Depression screen PHQ 2/9  Decreased Interest 0  Down, Depressed, Hopeless 0  PHQ - 2 Score 0    There were no vitals filed for this visit.  Medications Reviewed Today     Reviewed by Ezzard Rolin BIRCH, LCSW (Social Worker) on 07/04/24 at [phone]  Med List  Status: <None>   Medication Order Taking? Sig Documenting Provider Last Dose Status Informant  acetaminophen  (TYLENOL ) 500 MG tablet [id number]  Take 500 mg by mouth every 6 (six) hours  as needed (pain). [provider]  Active Spouse/Significant Other  aspirin  81 MG EC tablet [id number]  Take 1 tablet (81 mg total) by mouth daily. Claudene Victory ORN, MD  Active Spouse/Significant Other  divalproex  (DEPAKOTE ) 125 MG DR tablet [id number]  Take 1 tab  twice a day Wertman, Sara E, PA-C  Active Spouse/Significant Other  donepezil  (ARICEPT ) 10 MG tablet [id number]  Take 1 tablet (10 mg total) by mouth daily. Wertman, Sara E, PA-C  Active Spouse/Significant Other  memantine  (NAMENDA ) 10 MG tablet [id number]  Take 1 tablet  twice a day Wertman, Sara E, PA-C  Active Spouse/Significant Other  methimazole  (TAPAZOLE ) 5 MG tablet [id number]  Take 0.5 tablets (2.5 mg total) by mouth every other day. Caro Harlene POUR, NP  Active Spouse/Significant Other  Multiple Vitamins-Minerals (CENTRUM SILVER PO) [id number]  Take 1 tablet by mouth daily. [provider]  Active Spouse/Significant Other  rosuvastatin  (CRESTOR ) 40 MG tablet [id number]  TAKE 1 TABLET(40 MG) BY MOUTH DAILY [name], MD  Active Spouse/Significant Other  sertraline  (ZOLOFT ) 100 MG tablet [id number]  Take 1 tablet (100 mg total) by mouth daily. Caro Harlene POUR, NP  Active   tamsulosin  (FLOMAX ) 0.4 MG CAPS capsule [id number]  TAKE 1 CAPSULE(0.4 MG) BY MOUTH TWICE DAILY Caro, Jessica K, NP  Active Spouse/Significant Other            Recommendation:   Continue Current Plan of Care  Follow Up Plan:   Closing From:  Complex Care Management  Rolin Kerns, LCSW [place]  [facility], [facility] Clinical Social Worker Direct Dial: [phone]  Fax: [phone] Website: [email or web address] 9:43 AM

## 2024-07-05 DIAGNOSIS — H16143 Punctate keratitis, bilateral: Secondary | ICD-10-CM | POA: Diagnosis not present

## 2024-07-05 DIAGNOSIS — H0288A Meibomian gland dysfunction right eye, upper and lower eyelids: Secondary | ICD-10-CM | POA: Diagnosis not present

## 2024-07-05 DIAGNOSIS — H0288B Meibomian gland dysfunction left eye, upper and lower eyelids: Secondary | ICD-10-CM | POA: Diagnosis not present

## 2024-07-05 DIAGNOSIS — H04123 Dry eye syndrome of bilateral lacrimal glands: Secondary | ICD-10-CM | POA: Diagnosis not present

## 2024-07-11 DIAGNOSIS — H0288A Meibomian gland dysfunction right eye, upper and lower eyelids: Secondary | ICD-10-CM | POA: Diagnosis not present

## 2024-07-11 DIAGNOSIS — H04123 Dry eye syndrome of bilateral lacrimal glands: Secondary | ICD-10-CM | POA: Diagnosis not present

## 2024-07-11 DIAGNOSIS — H16143 Punctate keratitis, bilateral: Secondary | ICD-10-CM | POA: Diagnosis not present

## 2024-07-11 DIAGNOSIS — H0288B Meibomian gland dysfunction left eye, upper and lower eyelids: Secondary | ICD-10-CM | POA: Diagnosis not present

## 2024-07-21 DIAGNOSIS — M25551 Pain in right hip: Secondary | ICD-10-CM | POA: Diagnosis not present

## 2024-07-26 ENCOUNTER — Other Ambulatory Visit: Payer: Self-pay | Admitting: Medical

## 2024-07-26 DIAGNOSIS — R2241 Localized swelling, mass and lump, right lower limb: Secondary | ICD-10-CM

## 2024-07-27 ENCOUNTER — Ambulatory Visit
Admission: RE | Admit: 2024-07-27 | Discharge: 2024-07-27 | Disposition: A | Discharge status: Home / Self Care | Source: Ambulatory Visit | Attending: Medical | Admitting: Medical

## 2024-07-27 DIAGNOSIS — S79911A Unspecified injury of right hip, initial encounter: Secondary | ICD-10-CM | POA: Diagnosis not present

## 2024-07-27 DIAGNOSIS — R2241 Localized swelling, mass and lump, right lower limb: Secondary | ICD-10-CM

## 2024-08-04 ENCOUNTER — Telehealth: Payer: Self-pay

## 2024-08-04 NOTE — Telephone Encounter (Signed)
 Yes any provider to evaluate.

## 2024-08-04 NOTE — Telephone Encounter (Signed)
 Routing information for provider answer.  Source  [name], [name]   Subject  Timothy Gallagher (Patient)   Topic  Clinical - Medical Advice    Communication  Daughter Timothy Gallagher calling. Patient had an US  recently and now they are recommending an MRI please advise if he needs to be seen first or can you just order him the MRI        Please advise.

## 2024-08-04 NOTE — Telephone Encounter (Signed)
 Noted

## 2024-08-04 NOTE — Telephone Encounter (Signed)
 Whoever ordered the US  will evaluate and decide on what to order from there- if they need MRI the ordering provider will assess and order.

## 2024-08-04 NOTE — Telephone Encounter (Signed)
 Scheduled patient an appt on Monday 08/08/24 @ 1:20 for further evaluation. Spoke with patients daughter and she stated that she doesn't want this situation to be prolonged because patient is in a lot of pain and she doesn't want patient to not be able to walk.

## 2024-08-04 NOTE — Telephone Encounter (Signed)
 Your next available in office visit is not until 10/06/2024. Can he be scheduled with a different provider ?

## 2024-08-04 NOTE — Telephone Encounter (Signed)
 Please make pt an in office visit to discuss

## 2024-08-04 NOTE — Telephone Encounter (Signed)
 Spoke with patient daughter Darice regarding information below. Patient daugther stated that the ordering provider which was  [facility]  encounter can be reviewed from 07/21/24. 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 She stated that he said the patient's primary provider Caro Harlene POUR, NP would need to go over pts imaging and see if patient needs MRI . Informed pts daughter I will send information over to Caro Harlene POUR, NP and update her with further information. Patients daugther stated  Please he's in so much pain and we've been dealing with this for a long time  Routine to patients pcp for further review. Caro Harlene POUR, NP

## 2024-08-08 ENCOUNTER — Ambulatory Visit (INDEPENDENT_AMBULATORY_CARE_PROVIDER_SITE_OTHER): Admitting: Orthopedic Surgery

## 2024-08-08 ENCOUNTER — Encounter: Payer: Self-pay | Admitting: Orthopedic Surgery

## 2024-08-08 VITALS — BP 124/76 | HR 70 | Temp 97.4°F | Resp 18 | Ht 74.0 in | Wt 176.4 lb

## 2024-08-08 DIAGNOSIS — F01B3 Vascular dementia, moderate, with mood disturbance: Secondary | ICD-10-CM

## 2024-08-08 DIAGNOSIS — Z23 Encounter for immunization: Secondary | ICD-10-CM

## 2024-08-08 DIAGNOSIS — M25551 Pain in right hip: Secondary | ICD-10-CM | POA: Diagnosis not present

## 2024-08-08 DIAGNOSIS — R2241 Localized swelling, mass and lump, right lower limb: Secondary | ICD-10-CM | POA: Diagnosis not present

## 2024-08-08 MED ORDER — DOXYCYCLINE HYCLATE 100 MG PO TABS: 100.0000 mg | ORAL_TABLET | Freq: Two times a day (BID) | ORAL | 0 refills | Status: AC

## 2024-08-08 NOTE — Progress Notes (Signed)
 Careteam: Patient Care Team: Caro Harlene POUR, NP as PCP - General (Geriatric Medicine) Jeffrie Oneil BROCKS, MD as PCP - Cardiology (Cardiology) Gaston Hamilton, MD as Consulting Physician (Urology) Regenia, Prentice Clack, MD as Consulting Physician (Ophthalmology) Anner Alm ORN, MD as Consulting Physician (Cardiology) Dina Camie FORBES DEVONNA (Neurology) Trixie File, MD as Consulting Physician (Internal Medicine)  Seen by: Greig Cluster, AGNP-C  PLACE OF SERVICE:  [facility]  Advanced Directive information    Allergies  Allergen Reactions   Lipitor  [Atorvastatin ] Other (See Comments)    REACTION: nausea and blurred vision   Mycophenolate Mofetil Other (See Comments)    mycophenolate mofetil   Trazodone  And Nefazodone Other (See Comments)    dizzy   Ciprofloxacin  Swelling   Mycophenolate Mofetil Other (See Comments)    REACTION: unspecified   Amoxicillin Rash   Penicillins Rash    Chief Complaint  Patient presents with   Medical Management of Chronic Issues    Patient is needing to be evaluated for MRI       HPI: Patient is a 88 y.o. male seen today for acute visit due to right hip pain.   Discussed the use of AI scribe software for clinical note transcription with the patient, who gave verbal consent to proceed.  History of Present Illness   Timothy Gallagher is an 88 year old male who presents with a soft tissue lesion overlying the right pelvis and associated right hip pain.  Wife present during encounter.   A soft tissue lesion overlying the right pelvis was identified during an ultrasound at [facility] on September 10th. The lesion has been present for approximately six weeks and is described as a 'big knot' that was blue in color this morning. It is tender to touch and causes pain when lying on the affected side. No pus has been noted, and he has not been on antibiotics for this issue.  He experiences dizziness and has had a fall at home, although the  lesion was present before the fall. He denies using a cane or walker, but his wife and daughter report that he holds onto furniture while walking. He has a walker at home but denies recent use. Blood pressure 124/76 today.    Review of Systems:  Review of Systems  Unable to perform ROS: Dementia    Past Medical History:  Diagnosis Date   ACNE ROSACEA 06/27/2009   ACTINIC KERATOSIS, HEAD 04/18/2009   Acute maxillary sinusitis 05/14/2010   ALLERGIC RHINITIS 04/09/2007   Anal fissure 04/07/2016   B12 DEFICIENCY 06/07/2007   BACK PAIN WITH RADICULOPATHY 04/24/2008   Bilateral nephrolithiasis 07/07/2022   CT abd 06/2022 1. Nonobstructive bilateral nephrolithiasis measuring up to 5 mm on the right and 6 mm on the left.   Cancer [facility])    skin   CHEST WALL PAIN, ACUTE 06/15/2009   Chronic maxillary sinusitis 05/29/2008   COLITIS 04/27/2009   COLONIC POLYPS, HX OF 04/27/2009   tubular adenomas   Coronary artery disease 05/12/2014   Cath 05/12/2014 w/ severe 3-vessel CAD and preserved LV function, EF 55%   DERMATITIS, ATOPIC 10/12/2007   Diverticular disease 07/07/2022   CT abd 06/2022 . Colonic diverticulosis with no acute diverticulitis.   DIVERTICULOSIS, COLON 04/27/2009   ECCHYMOSES, SPONTANEOUS 06/27/2008   Elevated sedimentation rate 05/02/2009   ESOPHAGEAL STRICTURE 04/27/2009   GASTRITIS, CHRONIC 04/27/2009   History of kidney stones    HYPERLIPIDEMIA 03/13/2008   HYPERTENSION 04/09/2007   Hyperthyroidism  Iliac aneurysm 07/07/2022   CT abd 06/2022 . Aneurysmal left common iliac artery (1.6 cm).     Internal bleeding hemorrhoids 01/22/2015   01/22/2015 Seen at anoscopy, grade 1 all 3 positions    Irritable bowel syndrome 08/11/2007   KIDNEY DISEASE 04/09/2007   NECK PAIN 09/14/2008   NEUROPATHY, IDIOPATHIC PERIPHERAL NEC 08/11/2007   OSTEOARTHRITIS, WRIST, RIGHT 08/05/2010   Postoperative delirium 05/20/2014   S/P CABG x 4 05/19/2014   LIMA to LAD, SVG to diag, SVG to OM,  SVG to PDA, EVH via right thigh and leg   Past Surgical History:  Procedure Laterality Date   CARDIAC CATHETERIZATION     COLONOSCOPY     CORONARY ARTERY BYPASS GRAFT N/A 05/19/2014   Procedure: CORONARY ARTERY BYPASS GRAFTING (CABG);  Surgeon: Sudie VEAR Laine, MD;  Location: [facility] OR;  Service: Open Heart Surgery;  Laterality: N/A;  Times 4 using left internal mammary artery and endoscopically harvested right saphenous vein   CYSTOSCOPY/URETEROSCOPY/HOLMIUM LASER/STENT PLACEMENT Bilateral 04/29/2024   Procedure: CYSTOSCOPY/URETEROSCOPY/HOLMIUM LASER/STENT PLACEMENT;  Surgeon: Watt Rush, MD;  Location: [facility] ORS;  Service: Urology;  Laterality: Bilateral;  CYSTOSCOPY/RIGHT URETEROSCOPY/HOLMIUM LASER/STENT PLACEMENT/RETROGRADE PYELOGRAM   ESOPHAGOGASTRODUODENOSCOPY     EXTRACORPOREAL SHOCK WAVE LITHOTRIPSY Right 01/29/2024   Procedure: LITHOTRIPSY, ESWL;  Surgeon: Lovie Arlyss CROME, MD;  Location: [facility] ORS;  Service: Urology;  Laterality: Right;   EYE SURGERY     FINGER SURGERY     cut off end of finger   FLEXIBLE SIGMOIDOSCOPY     HEMORRHOID BANDING     HERNIA REPAIR     INCISION AND DRAINAGE WOUND WITH FOREIGN BODY REMOVAL Left 12/20/2013   Procedure: INCISION AND DRAINAGE LEFT INDEX FINGER;  Surgeon: Franky JONELLE Curia, MD;  Location: [facility] ORS;  Service: Orthopedics;  Laterality: Left;   INTRAOPERATIVE TRANSESOPHAGEAL ECHOCARDIOGRAM N/A 05/19/2014   Procedure: INTRAOPERATIVE TRANSESOPHAGEAL ECHOCARDIOGRAM;  Surgeon: Sudie VEAR Laine, MD;  Location: [facility] OR;  Service: Open Heart Surgery;  Laterality: N/A;   lamenectomy     LEFT HEART CATHETERIZATION WITH CORONARY ANGIOGRAM N/A 05/12/2014   Procedure: LEFT HEART CATHETERIZATION WITH CORONARY ANGIOGRAM;  Surgeon: Victory LELON Claudene DOUGLAS, MD;  Location: [facility] CATH LAB;  Service: Cardiovascular;  Laterality: N/A;   LUMBAR FUSION     TONSILLECTOMY     VARICOSE VEIN SURGERY Left    Social History:   reports that he quit smoking about 25 years ago. His smoking use  included cigarettes. He started smoking about 67 years ago. He has a 21 pack-year smoking history. He has never used smokeless tobacco. He reports that he does not drink alcohol and does not use drugs.  Family History  Problem Relation Age of Onset   Hernia Mother    Heart disease Father        smoker   COPD Father    Heart attack Father    Heart attack Brother    Early death Daughter    Colon cancer Neg Hx    Esophageal cancer Neg Hx    Pancreatic cancer Neg Hx     Medications: Patient's Medications  New Prescriptions   DOXYCYCLINE  (VIBRA -TABS) 100 MG TABLET    Take 1 tablet (100 mg total) by mouth 2 (two) times daily for 14 days.  Previous Medications   ACETAMINOPHEN  (TYLENOL ) 500 MG TABLET    Take 500 mg by mouth every 6 (six) hours as needed (pain).   ASPIRIN  81 MG EC TABLET    Take 1 tablet (81 mg total) by  mouth daily.   DIVALPROEX  (DEPAKOTE ) 125 MG DR TABLET    Take 1 tab  twice a day   DONEPEZIL  (ARICEPT ) 10 MG TABLET    Take 1 tablet (10 mg total) by mouth daily.   MEMANTINE  (NAMENDA ) 10 MG TABLET    Take 1 tablet  twice a day   METHIMAZOLE  (TAPAZOLE ) 5 MG TABLET    Take 0.5 tablets (2.5 mg total) by mouth every other day.   MULTIPLE VITAMINS-MINERALS (CENTRUM SILVER PO)    Take 1 tablet by mouth daily.   ROSUVASTATIN  (CRESTOR ) 40 MG TABLET    TAKE 1 TABLET(40 MG) BY MOUTH DAILY   SERTRALINE  (ZOLOFT ) 100 MG TABLET    Take 1 tablet (100 mg total) by mouth daily.   TAMSULOSIN  (FLOMAX ) 0.4 MG CAPS CAPSULE    TAKE 1 CAPSULE(0.4 MG) BY MOUTH TWICE DAILY  Modified Medications   No medications on file  Discontinued Medications   No medications on file    Physical Exam:  Vitals:   08/08/24 1314  BP: 124/76  Pulse: 70  Resp: 18  Temp: (!) 97.4 F (36.3 C)  SpO2: 95%  Weight: 176 lb 6.4 oz (80 kg)  Height: 6' 2 (1.88 m)   Body mass index is 22.65 kg/m. Wt Readings from Last 3 Encounters:  08/08/24 176 lb 6.4 oz (80 kg)  06/27/24 180 lb (81.6 kg)  05/02/24 180 lb  6.4 oz (81.8 kg)    Physical Exam Vitals reviewed.  Constitutional:      General: He is not in acute distress. HENT:     Head: Normocephalic.  Eyes:     General:        Right eye: No discharge.        Left eye: No discharge.  Cardiovascular:     Rate and Rhythm: Normal rate and regular rhythm.     Pulses: Normal pulses.     Heart sounds: Normal heart sounds.  Pulmonary:     Effort: Pulmonary effort is normal.     Breath sounds: Normal breath sounds.  Abdominal:     General: Bowel sounds are normal. There is no distension.     Palpations: Abdomen is soft.     Tenderness: There is no abdominal tenderness.  Musculoskeletal:     Cervical back: Neck supple.     Right hip: Tenderness present. No deformity or crepitus. Normal range of motion. Normal strength.     Right lower leg: No edema.     Left lower leg: No edema.  Skin:    General: Skin is warm.     Capillary Refill: Capillary refill takes less than 2 seconds.     Findings: Lesion present.     Comments: Approx 1-2 cm scabbed lesion, tender to touch/cool, surrounding skin with erythema and swelling, no drainage.   Neurological:     General: No focal deficit present.     Mental Status: He is alert.     Gait: Gait abnormal.  Psychiatric:        Mood and Affect: Mood normal.     Labs reviewed: Basic Metabolic Panel: Recent Labs    09/04/23 1052 01/12/24 1056 01/25/24 1149 03/25/24 1039 04/25/24 1018 06/27/24 1106  NA  --  141   < > 142 140 143  K  --  4.0   < > 4.6 4.6 4.6  CL  --  104   < > 106 106 107  CO2  --  30   < >  29 24 29   GLUCOSE  --  93   < > 91 92 92  BUN  --  16   < > 11 16 14   CREATININE  --  0.94   < > 0.76 0.79 0.92  CALCIUM   --  8.7   < > 8.8 8.9 8.8  TSH 1.52 1.79  --  2.34  --   --    < > = values in this interval not displayed.   Liver Function Tests: Recent Labs    01/12/24 1056 01/25/24 1149 03/25/24 1039  AST 19 37 18  ALT 11 22 10   ALKPHOS 72 65  --   BILITOT 0.5 0.9 0.5   PROT 6.6 7.3 6.3  ALBUMIN  3.7 3.5  --    Recent Labs    01/25/24 1149  LIPASE 32   No results for input(s): AMMONIA in the last 8760 hours. CBC: Recent Labs    01/12/24 1056 01/25/24 1149 03/25/24 1039 04/25/24 1018 06/27/24 1106  WBC 5.3   < > 6.3 6.9 5.5  NEUTROABS 3.1  --  3,597  --  3,174  HGB 12.3*   < > 11.4* 12.1* 12.0*  HCT 38.6*   < > 35.6* 39.6 38.5  MCV 85.9   < > 87.3 89.2 86.7  PLT 179.0   < > 188 147* 176   < > = values in this interval not displayed.   Lipid Panel: Recent Labs    01/12/24 1056  CHOL 115  HDL 53.10  LDLCALC 46  TRIG 78.0  CHOLHDL 2   TSH: Recent Labs    09/04/23 1052 01/12/24 1056 03/25/24 1039  TSH 1.52 1.79 2.34   A1C: Lab Results  Component Value Date   HGBA1C 6.2 (H) 06/27/2024     Assessment/Plan 1. Right hip pain (Primary) - ongoing - followed by [facility]  - ? Abscess versus arthritis - unstable gait>  furniture grabbing per wife - discussed falls safety and using cane  - CT HIP RIGHT WO CONTRAST; Future  2. Hip mass, right - see above - 09/10 US  right hip noted soft tissue lesion over right pelvis - swollen lesion with tenderness, swelling, erythema, afebrile - will start doxycycline  for suspected infection/abscess  - doxycycline  (VIBRA -TABS) 100 MG tablet; Take 1 tablet (100 mg total) by mouth 2 (two) times daily for 14 days.  Dispense: 28 tablet; Refill: 0  3. Immunization due - Flu vaccine HIGH DOSE PF(Fluzone Trivalent)  4. Moderate mixed vascular and neurodegenerative dementia with mood disturbance (HCC) - followed by neurology - no behaviors - unstable gait - weight down 4 lbs - cont Depakote , Aricept  and Namenda   Total time: 31 minutes. Greater than 50% of total time spent doing patient education regarding health maintenance, right hip pain/lesion, dementia, falls safety including symptom/medication management.     Next appt: Visit date not found  Piero Mustard Gil BODILY  [facility] [phone]

## 2024-08-08 NOTE — Patient Instructions (Addendum)
 Recommend using a cane due to imbalanced gait  Will start doxycycline  for probable abscess  CT right hip ordered due to soft tissue

## 2024-08-10 ENCOUNTER — Encounter: Payer: Self-pay | Admitting: Internal Medicine

## 2024-08-10 DIAGNOSIS — H18892 Other specified disorders of cornea, left eye: Secondary | ICD-10-CM | POA: Diagnosis not present

## 2024-08-11 ENCOUNTER — Ambulatory Visit
Admission: RE | Admit: 2024-08-11 | Discharge: 2024-08-11 | Disposition: A | Discharge status: Home / Self Care | Source: Ambulatory Visit | Attending: Orthopedic Surgery | Admitting: Orthopedic Surgery

## 2024-08-11 DIAGNOSIS — M1611 Unilateral primary osteoarthritis, right hip: Secondary | ICD-10-CM | POA: Diagnosis not present

## 2024-08-11 DIAGNOSIS — M25551 Pain in right hip: Secondary | ICD-10-CM

## 2024-08-12 ENCOUNTER — Encounter: Payer: Self-pay | Admitting: Nurse Practitioner

## 2024-08-15 ENCOUNTER — Other Ambulatory Visit: Payer: Self-pay | Admitting: Orthopedic Surgery

## 2024-08-15 ENCOUNTER — Ambulatory Visit: Payer: Self-pay | Admitting: Orthopedic Surgery

## 2024-08-15 DIAGNOSIS — R2241 Localized swelling, mass and lump, right lower limb: Secondary | ICD-10-CM

## 2024-08-15 DIAGNOSIS — H04123 Dry eye syndrome of bilateral lacrimal glands: Secondary | ICD-10-CM | POA: Diagnosis not present

## 2024-08-15 DIAGNOSIS — M25551 Pain in right hip: Secondary | ICD-10-CM

## 2024-08-15 DIAGNOSIS — H0102A Squamous blepharitis right eye, upper and lower eyelids: Secondary | ICD-10-CM | POA: Diagnosis not present

## 2024-08-15 DIAGNOSIS — H0102B Squamous blepharitis left eye, upper and lower eyelids: Secondary | ICD-10-CM | POA: Diagnosis not present

## 2024-08-15 DIAGNOSIS — H18892 Other specified disorders of cornea, left eye: Secondary | ICD-10-CM | POA: Diagnosis not present

## 2024-08-15 NOTE — Telephone Encounter (Signed)
 Tobias please answer question about other locations for MRI

## 2024-08-15 NOTE — Telephone Encounter (Signed)
 I guess so the CT does not tell you much about it

## 2024-08-22 ENCOUNTER — Telehealth: Payer: Self-pay

## 2024-08-22 NOTE — Telephone Encounter (Signed)
 Copied from CRM [id number]. Topic: General - Other >> Aug 22, 2024  8:31 AM Antonio DEL wrote: Reason for CRM: Patient's daughter Darice wants to let provider know the place on her dad's hip now has a sore on the outside of it. It was noticed about 2-3 days. Other than that, no new symptoms but she said she didn't know if provider wanted to know that or not. She also wanted to know if MRI could be moved up before the 14th? I advised her to contact [facility] but she also wanted to know if MRI order could be sent somewhere else that has earlier availability? Her call back number [phone]

## 2024-08-22 NOTE — Telephone Encounter (Signed)
 If hip sore has increased redness, swelling, pus drainage or pain please let us  know. Please let me know imaging center you would like referral sent to.

## 2024-08-23 DIAGNOSIS — G300 Alzheimer's disease with early onset: Secondary | ICD-10-CM | POA: Diagnosis not present

## 2024-08-23 DIAGNOSIS — H5440 Blindness, one eye, unspecified eye: Secondary | ICD-10-CM | POA: Diagnosis not present

## 2024-08-23 DIAGNOSIS — Z7901 Long term (current) use of anticoagulants: Secondary | ICD-10-CM | POA: Diagnosis not present

## 2024-08-23 DIAGNOSIS — H18892 Other specified disorders of cornea, left eye: Secondary | ICD-10-CM | POA: Diagnosis not present

## 2024-08-23 DIAGNOSIS — F0280 Dementia in other diseases classified elsewhere without behavioral disturbance: Secondary | ICD-10-CM | POA: Diagnosis not present

## 2024-08-23 DIAGNOSIS — H5712 Ocular pain, left eye: Secondary | ICD-10-CM | POA: Diagnosis not present

## 2024-08-23 DIAGNOSIS — H1849 Other corneal degeneration: Secondary | ICD-10-CM | POA: Diagnosis not present

## 2024-08-23 DIAGNOSIS — Z951 Presence of aortocoronary bypass graft: Secondary | ICD-10-CM | POA: Diagnosis not present

## 2024-08-24 NOTE — Telephone Encounter (Signed)
Message routed to covering provider

## 2024-08-30 ENCOUNTER — Ambulatory Visit
Admission: RE | Admit: 2024-08-30 | Discharge: 2024-08-30 | Disposition: A | Discharge status: Home / Self Care | Source: Ambulatory Visit | Attending: Orthopedic Surgery | Admitting: Orthopedic Surgery

## 2024-08-30 DIAGNOSIS — M1611 Unilateral primary osteoarthritis, right hip: Secondary | ICD-10-CM | POA: Diagnosis not present

## 2024-08-30 DIAGNOSIS — M25551 Pain in right hip: Secondary | ICD-10-CM

## 2024-08-30 DIAGNOSIS — R2241 Localized swelling, mass and lump, right lower limb: Secondary | ICD-10-CM

## 2024-08-30 MED ORDER — GADOPICLENOL 0.5 MMOL/ML IV SOLN
8.0000 mL | Freq: Once | INTRAVENOUS | Status: AC | PRN
  Administered 2024-08-30: 8 mL via INTRAVENOUS

## 2024-08-31 ENCOUNTER — Other Ambulatory Visit: Payer: Self-pay | Admitting: Surgery

## 2024-08-31 DIAGNOSIS — I7121 Aneurysm of the ascending aorta, without rupture: Secondary | ICD-10-CM

## 2024-09-01 ENCOUNTER — Ambulatory Visit: Payer: Self-pay | Admitting: Nurse Practitioner

## 2024-09-01 ENCOUNTER — Ambulatory Visit: Payer: Self-pay | Admitting: Orthopedic Surgery

## 2024-09-01 DIAGNOSIS — M7071 Other bursitis of hip, right hip: Secondary | ICD-10-CM

## 2024-09-01 NOTE — Telephone Encounter (Signed)
 FYI Only or Action Required?: FYI only for provider.  Patient was last seen in primary care on 08/08/2024 by Gil Greig BRAVO, NP.  Called Nurse Triage reporting Advice Only.  Triage Disposition: Information or Advice Only Call  Patient/caregiver understands and will follow disposition?: Yes             Copied from CRM [id number]. Topic: Clinical - Lab/Test Results >> Sep 01, 2024  3:55 PM Timothy Gallagher wrote: Reason for CRM: patient daughter Timothy Gallagher calling about patients MRI results and would like to speak with someone about the results and also referral to [facility]   Patients daughter wants a better understanding of right iliopsoas bursa transferred to NT line   Timothy Gallagher (daughter) Timothy Gallagher [phone] Reason for Disposition  Health information question, no triage required and triager able to answer question  Answer Assessment - Initial Assessment Questions This RN spoke with pt's daughter, Timothy. Pt daughter wanted more information on what right iliopsoas bursa is. This RN explained what this is and all questions answered at this time.  Protocols used: Information Only Call - No Triage-A-AH

## 2024-09-01 NOTE — Telephone Encounter (Signed)
 See Triage Notes from Triage Nurse

## 2024-09-01 NOTE — Telephone Encounter (Signed)
 Patient has a little confusion on what surgery patient getting...   Daughter would like for you to give her a call about his surgery plan

## 2024-09-02 NOTE — Telephone Encounter (Signed)
 Patient has been notified and had no questions or concerns.

## 2024-09-02 NOTE — Telephone Encounter (Signed)
 Orthopedic referral placed due to inflammation (bursitis) for ongoing evaluation and treatment

## 2024-09-06 ENCOUNTER — Ambulatory Visit: Payer: Medicare Other | Admitting: Internal Medicine

## 2024-09-06 ENCOUNTER — Encounter: Payer: Self-pay | Admitting: Internal Medicine

## 2024-09-06 ENCOUNTER — Other Ambulatory Visit

## 2024-09-06 VITALS — BP 120/58 | HR 72 | Ht 74.0 in | Wt 175.4 lb

## 2024-09-06 DIAGNOSIS — E0500 Thyrotoxicosis with diffuse goiter without thyrotoxic crisis or storm: Secondary | ICD-10-CM | POA: Diagnosis not present

## 2024-09-06 DIAGNOSIS — E0590 Thyrotoxicosis, unspecified without thyrotoxic crisis or storm: Secondary | ICD-10-CM | POA: Diagnosis not present

## 2024-09-06 NOTE — Patient Instructions (Signed)
 Please stop at the lab.  Please continue Methimazole  5 mg every other day.  Please return for a follow-up appointment in 1 year and in  6 months for labs.

## 2024-09-06 NOTE — Progress Notes (Signed)
 Patient ID: Timothy Gallagher, male   DOB: 12/12/1935, 88 y.o.   MRN: [id number]  HPI  MAURICO PERRELL is a 88 y.o.-year-old male, returning for follow-up for thyrotoxicosis, diagnosed as Graves' disease 08/2022.  He previously saw Dr. Kassie, but last visit with me 1 year ago.  He is here with his wife who offers part of the information especially related to past medical history, previous studies done and medication.  Interim history: Patient feels well, without complaints. However, it is difficult to extract information from him as he jokes continuously.  Wife offers most of the history and confirms that he has no complaints about new symptoms.  She does mention that he has anxiety, though.  No heat intolerance, palpitations, or increase in tremors. He has an increased appetite (snacks) - not new >> eating smaller portions now.  He lost approximately 17 pounds since last visit. He has L eye infection >> saw ophthalmology >> was on ABx.  Reviewed and addended history: Patient was diagnosed with thyrotoxicosis in 2019. We confirmed Graves' disease in 08/2022 by elevated TSI's. He did not have imaging or thyroid  antibody checks. Previously on MMI (methimazole ) 5 mg daily. In 02/2023, we decreased the dose of MMI to 2.5 mg daily (takes 5 mg every other day).  I reviewed pt's thyroid  tests: Lab Results  Component Value Date   TSH 2.34 03/25/2024   TSH 1.79 01/12/2024   TSH 1.52 09/04/2023   TSH 2.83 07/02/2023   TSH 7.85 (H) 03/05/2023   TSH 5.37 09/01/2022   TSH 5.60 (H) 07/22/2022   TSH 3.86 02/11/2022   TSH 0.24 (L) 11/27/2021   TSH 0.02 (L) 10/01/2021   FREET4 0.92 01/12/2024   FREET4 0.84 09/04/2023   FREET4 0.73 07/02/2023   FREET4 0.74 03/05/2023   FREET4 0.70 09/01/2022   FREET4 0.76 07/22/2022   FREET4 0.72 02/11/2022   FREET4 0.84 11/27/2021   FREET4 1.43 10/01/2021   FREET4 0.93 03/26/2021   T3FREE 3.3 01/12/2024   T3FREE 3.1 09/04/2023   T3FREE 2.9 07/02/2023   T3FREE  2.8 03/05/2023   T3FREE 3.2 09/01/2022   T3FREE 3.8 01/13/2018   Antithyroid antibodies: Lab Results  Component Value Date   TSI 204 (H) 09/01/2022   Pt denies: - feeling nodules in neck - hoarseness - dysphagia >> improved after starting Omeprazole  - choking  Ba swallow (06/24/2022): 1. No esophageal mucosal lesions or strictures seen. 2. Significant esophageal dysmotility. 3. Esophagus does clear in the upright position with both water and dry swallowing, allowing for normal passage of a barium tablet.  He denies: - fatigue - excessive sweating/heat intolerance - tremors (he has mild tremors on physical exam - chronic) - anxiety - palpitations - weight loss  Pt has a FH of thyroid  ds.: + son. No FH of thyroid  cancer. No h/o radiation tx to head or neck. No recent contrast studies. No steroid use. No herbal supplements. No Biotin use.  Pt. also has a history of CAD - s/p CABG x4, HTN, CKD, HL, B12 deficiency, nephrolithiasis.  He is on Namenda  and Aricept . He saw ophthalmology at [facility] (Dr. Candyce) for the following: 1. Recurrent subtotal retinal detachment OS 2. Proliferative vitreoretinopathy OS 3. History of chronic retinal detachment OS 4. s/p retinal detachment repair with vitrectomy, membrane peel, laser and gas 06/17/17  ROS: Constitutional: + see HPI  Past Medical History:  Diagnosis Date   ACNE ROSACEA 06/27/2009   ACTINIC KERATOSIS, HEAD 04/18/2009   Acute maxillary sinusitis 05/14/2010  ALLERGIC RHINITIS 04/09/2007   Anal fissure 04/07/2016   B12 DEFICIENCY 06/07/2007   BACK PAIN WITH RADICULOPATHY 04/24/2008   Bilateral nephrolithiasis 07/07/2022   CT abd 06/2022 1. Nonobstructive bilateral nephrolithiasis measuring up to 5 mm on the right and 6 mm on the left.   Cancer [facility])    skin   CHEST WALL PAIN, ACUTE 06/15/2009   Chronic maxillary sinusitis 05/29/2008   COLITIS 04/27/2009   COLONIC POLYPS, HX OF 04/27/2009   tubular adenomas   Coronary  artery disease 05/12/2014   Cath 05/12/2014 w/ severe 3-vessel CAD and preserved LV function, EF 55%   DERMATITIS, ATOPIC 10/12/2007   Diverticular disease 07/07/2022   CT abd 06/2022 . Colonic diverticulosis with no acute diverticulitis.   DIVERTICULOSIS, COLON 04/27/2009   ECCHYMOSES, SPONTANEOUS 06/27/2008   Elevated sedimentation rate 05/02/2009   ESOPHAGEAL STRICTURE 04/27/2009   GASTRITIS, CHRONIC 04/27/2009   History of kidney stones    HYPERLIPIDEMIA 03/13/2008   HYPERTENSION 04/09/2007   Hyperthyroidism    Iliac aneurysm 07/07/2022   CT abd 06/2022 . Aneurysmal left common iliac artery (1.6 cm).     Internal bleeding hemorrhoids 01/22/2015   01/22/2015 Seen at anoscopy, grade 1 all 3 positions    Irritable bowel syndrome 08/11/2007   KIDNEY DISEASE 04/09/2007   NECK PAIN 09/14/2008   NEUROPATHY, IDIOPATHIC PERIPHERAL NEC 08/11/2007   OSTEOARTHRITIS, WRIST, RIGHT 08/05/2010   Postoperative delirium 05/20/2014   S/P CABG x 4 05/19/2014   LIMA to LAD, SVG to diag, SVG to OM, SVG to PDA, EVH via right thigh and leg   Past Surgical History:  Procedure Laterality Date   CARDIAC CATHETERIZATION     COLONOSCOPY     CORONARY ARTERY BYPASS GRAFT N/A 05/19/2014   Procedure: CORONARY ARTERY BYPASS GRAFTING (CABG);  Surgeon: Sudie VEAR Laine, MD;  Location: [facility] OR;  Service: Open Heart Surgery;  Laterality: N/A;  Times 4 using left internal mammary artery and endoscopically harvested right saphenous vein   CYSTOSCOPY/URETEROSCOPY/HOLMIUM LASER/STENT PLACEMENT Bilateral 04/29/2024   Procedure: CYSTOSCOPY/URETEROSCOPY/HOLMIUM LASER/STENT PLACEMENT;  Surgeon: Watt Rush, MD;  Location: [facility] ORS;  Service: Urology;  Laterality: Bilateral;  CYSTOSCOPY/RIGHT URETEROSCOPY/HOLMIUM LASER/STENT PLACEMENT/RETROGRADE PYELOGRAM   ESOPHAGOGASTRODUODENOSCOPY     EXTRACORPOREAL SHOCK WAVE LITHOTRIPSY Right 01/29/2024   Procedure: LITHOTRIPSY, ESWL;  Surgeon: Lovie Arlyss CROME, MD;  Location: [facility] ORS;  Service:  Urology;  Laterality: Right;   EYE SURGERY     FINGER SURGERY     cut off end of finger   FLEXIBLE SIGMOIDOSCOPY     HEMORRHOID BANDING     HERNIA REPAIR     INCISION AND DRAINAGE WOUND WITH FOREIGN BODY REMOVAL Left 12/20/2013   Procedure: INCISION AND DRAINAGE LEFT INDEX FINGER;  Surgeon: Franky JONELLE Curia, MD;  Location: [facility] ORS;  Service: Orthopedics;  Laterality: Left;   INTRAOPERATIVE TRANSESOPHAGEAL ECHOCARDIOGRAM N/A 05/19/2014   Procedure: INTRAOPERATIVE TRANSESOPHAGEAL ECHOCARDIOGRAM;  Surgeon: Sudie VEAR Laine, MD;  Location: [facility] OR;  Service: Open Heart Surgery;  Laterality: N/A;   lamenectomy     LEFT HEART CATHETERIZATION WITH CORONARY ANGIOGRAM N/A 05/12/2014   Procedure: LEFT HEART CATHETERIZATION WITH CORONARY ANGIOGRAM;  Surgeon: Victory LELON Claudene DOUGLAS, MD;  Location: [facility] CATH LAB;  Service: Cardiovascular;  Laterality: N/A;   LUMBAR FUSION     TONSILLECTOMY     VARICOSE VEIN SURGERY Left    Social History   Socioeconomic History   Marital status: Married    Spouse name: Not on file   Number of children: 3  Years of education: 24   Highest education level: Not on file  Occupational History   Occupation: retired    Associate Professor: RETIRED    Comment: from [employer]  Tobacco Use   Smoking status: Former    Current packs/day: 0.00    Average packs/day: 0.5 packs/day for 42.0 years (21.0 ttl pk-yrs)    Types: Cigarettes    Start date: 11/17/1956    Quit date: 11/17/1998    Years since quitting: 25.8   Smokeless tobacco: Never  Vaping Use   Vaping status: Never Used  Substance and Sexual Activity   Alcohol use: No   Drug use: No   Sexual activity: Yes  Other Topics Concern   Not on file  Social History Narrative   Married 35 years (2nd marriage). 2 kids from previous marriage (lost a 3rd child hit by car at age 60). 2 stepchildren. 6 grandkids. 2 greatgrandkids.       Retired from [employer]      Hobbies: walking/exercise, building in shop-furniture (cut  finger off in February doing this)   6 grand children - Has bought them all a new car    Brother died of probably addiction   Sister also died; not sure of cause    He enjoys his life; Likes to play the keyboard; guitar    Had played in a gospel quartet    Enjoys working with wood    Right handed   Drinks caffeine   Social Drivers of Corporate investment banker Strain: Low Risk  (10/20/2023)   Overall Financial Resource Strain (CARDIA)    Difficulty of Paying Living Expenses: Not hard at all  Food Insecurity: No Food Insecurity (05/09/2024)   Hunger Vital Sign    Worried About Running Out of Food in the Last Year: Never true    Ran Out of Food in the Last Year: Never true  Transportation Needs: No Transportation Needs (05/09/2024)   PRAPARE - Administrator, Civil Service (Medical): No    Lack of Transportation (Non-Medical): No  Physical Activity: Inactive (10/20/2023)   Exercise Vital Sign    Days of Exercise per Week: 0 days    Minutes of Exercise per Session: 0 min  Stress: No Stress Concern Present (10/20/2023)   [employer] of Occupational Health - Occupational Stress Questionnaire    Feeling of Stress : Not at all  Social Connections: Moderately Isolated (01/26/2024)   Social Connection and Isolation Panel    Frequency of Communication with Friends and Family: Never    Frequency of Social Gatherings with Friends and Family: Never    Attends Religious Services: More than 4 times per year    Active Member of [employer] or Organizations: No    Attends Banker Meetings: Never    Marital Status: Married  Catering manager Violence: Not At Risk (05/09/2024)   Humiliation, Afraid, Rape, and Kick questionnaire    Fear of Current or Ex-Partner: No    Emotionally Abused: No    Physically Abused: No    Sexually Abused: No   Current Outpatient Medications on File Prior to Visit  Medication Sig Dispense Refill   acetaminophen  (TYLENOL ) 500 MG tablet Take 500  mg by mouth every 6 (six) hours as needed (pain).     aspirin  81 MG EC tablet Take 1 tablet (81 mg total) by mouth daily.     divalproex  (DEPAKOTE ) 125 MG DR tablet Take 1 tab  twice a day 180  tablet 3   donepezil  (ARICEPT ) 10 MG tablet Take 1 tablet (10 mg total) by mouth daily. 30 tablet 11   memantine  (NAMENDA ) 10 MG tablet Take 1 tablet  twice a day 60 tablet 11   methimazole  (TAPAZOLE ) 5 MG tablet Take 0.5 tablets (2.5 mg total) by mouth every other day.     Multiple Vitamins-Minerals (CENTRUM SILVER PO) Take 1 tablet by mouth daily.     rosuvastatin  (CRESTOR ) 40 MG tablet TAKE 1 TABLET(40 MG) BY MOUTH DAILY 90 tablet 3   sertraline  (ZOLOFT ) 100 MG tablet Take 1 tablet (100 mg total) by mouth daily. 90 tablet 1   tamsulosin  (FLOMAX ) 0.4 MG CAPS capsule TAKE 1 CAPSULE(0.4 MG) BY MOUTH TWICE DAILY 180 capsule 1   No current facility-administered medications on file prior to visit.   Allergies  Allergen Reactions   Lipitor  [Atorvastatin ] Other (See Comments)    REACTION: nausea and blurred vision   Mycophenolate Mofetil Other (See Comments)    mycophenolate mofetil   Trazodone  And Nefazodone Other (See Comments)    dizzy   Ciprofloxacin  Swelling   Mycophenolate Mofetil Other (See Comments)    REACTION: unspecified   Amoxicillin Rash   Penicillins Rash   Family History  Problem Relation Age of Onset   Hernia Mother    Heart disease Father        smoker   COPD Father    Heart attack Father    Heart attack Brother    Early death Daughter    Colon cancer Neg Hx    Esophageal cancer Neg Hx    Pancreatic cancer Neg Hx    PE: BP (!) 120/58   Pulse 72   Ht 6' 2 (1.88 m)   Wt 175 lb 6.4 oz (79.6 kg)   SpO2 93%   BMI 22.52 kg/m  Wt Readings from Last 25 Encounters:  09/06/24 175 lb 6.4 oz (79.6 kg)  08/08/24 176 lb 6.4 oz (80 kg)  06/27/24 180 lb (81.6 kg)  05/02/24 180 lb 6.4 oz (81.8 kg)  04/29/24 176 lb (79.8 kg)  04/25/24 176 lb (79.8 kg)  03/25/24 180 lb 12.8 oz  (82 kg)  03/09/24 180 lb (81.6 kg)  02/01/24 180 lb 6.4 oz (81.8 kg)  01/29/24 181 lb (82.1 kg)  01/27/24 184 lb 15.5 oz (83.9 kg)  01/12/24 185 lb (83.9 kg)  11/23/23 185 lb 12.8 oz (84.3 kg)  10/20/23 199 lb (90.3 kg)  10/05/23 199 lb (90.3 kg)  09/09/23 190 lb (86.2 kg)  09/04/23 188 lb 3.2 oz (85.4 kg)  07/22/23 185 lb 8 oz (84.1 kg)  07/02/23 184 lb 12.8 oz (83.8 kg)  03/10/23 183 lb (83 kg)  03/05/23 182 lb (82.6 kg)  02/10/23 185 lb 4 oz (84 kg)  12/31/22 184 lb 9.6 oz (83.7 kg)  12/18/22 181 lb (82.1 kg)  11/28/22 182 lb 3.2 oz (82.6 kg)   Constitutional: normal weight, in NAD Eyes: no exophthalmos, no lid lag, no stare ENT: no thyromegaly, no thyroid  bruits, no cervical lymphadenopathy Cardiovascular: RRR, No MRG Respiratory: CTA B Musculoskeletal: no deformities Skin: no rashes Neurological: + mild tremor with outstretched hands  ASSESSMENT: 1.  Graves' disease - dx'ed as thyrotoxicosis in 2019, then as Graves ds. In 2023  PLAN:  1. Patient with a history of a low TSH and elevated TSI antibodies, with thyrotoxic symptoms: Weight loss, heat intolerance, hyperdefecation, palpitations, anxiety, all improved after starting methimazole .  He previously declined RAI treatment per review  of Dr. Laymond notes.  We discussed about different treatment options for Graves' disease including continuing methimazole , RAI ablation, or surgery, but since he was doing well on methimazole , we continued this. -We were able to decrease the dose of methimazole  to 2.5 mg daily, which she continues today. -He tolerates methimazole  well.  He was also on a beta-blocker (Toprol ), now off. - At today's visit, he appears euthyroid, without complaints except for anxiety per wife's report.  He has tremors with outstretched hands, which is chronic for him.  He did have a 13 pound weight loss since last visit, but no heat intolerance, insomnia.  Also, no tachycardia or palpitations.  He continues to  have an increased appetite and eats frequently throughout the day, including snacks, but smaller portions lately per wife's report.   - he has no active signs of Graves' ophthalmopathy: No blurry vision, double vision, eye pain, chemosis.  He has a left eye infection for which he was on repeated antibiotic courses.  He was advised not to touch his eye but he continues to do so. -At today's visit, we will recheck his TFTs and change the methimazole  dose accordingly.  I will also check his TSI's today.  We discussed that if his TSI's are elevated, we may need to continue the methimazole  even if his TFTs are normal. - OTW I will see him back in 1 year, but for labs at 6 months.  They are interested in possibly seeing PCP after this.  I agree with this if the labs remain normal.  He needs refills of methimazole  for 1 year.  Orders Placed This Encounter  Procedures   TSH   T4, free   T3, free   Thyroid  stimulating immunoglobulin   Lela Fendt, MD PhD [facility] Endocr[facility]ology

## 2024-09-07 ENCOUNTER — Other Ambulatory Visit: Payer: Self-pay | Admitting: Family Medicine

## 2024-09-07 ENCOUNTER — Other Ambulatory Visit: Payer: Self-pay | Admitting: Physician Assistant

## 2024-09-07 NOTE — Progress Notes (Signed)
 Assessment/Plan:   Dementia likely due to mixed vascular and Alzheimer's disease without behavioral disturbance***  Timothy Gallagher is a very pleasant 88 y.o. RH male with a history of Grave's disease, B12, HOH, and a history of Dementia likely due to Alzheimer's and Vascular etiology seen today in follow up for memory loss. Patient is currently on donepezil  10 mg daily and memantine  10 mg twice daily.  Cognitive decline is noted, no longer drives.  He does not enjoy social activities although he could benefit from ADP for senior center.  Sundowning episodes and irritability is better controlled with Depakote .  Patient is able to participate on ADLs***and to drive without significant difficulties.  Mood is***    Follow up in   months. Continue donepezil  10 mg daily and memantine  10 mg twice daily, side effects discussed Continue Depakote  125 mg at night, may increase to 1 tab twice a day for sundowning, hallucinations, mood. Recommend using hearing aids to improve comprehension Recommend good control of her cardiovascular risk factors Continue to control other mood medicines as per PCP     Subjective:    This patient is accompanied in the office by his wife*** who supplements the history.  Previous records as well as any outside records available were reviewed prior to todays visit. Patient was last seen on 03/09/2024***   Any changes in memory since last visit?   Worse than before .  He forgets very rapidly, within minutes.  He has more difficulty with short-term memory but LTM is affected as well.  He has very poor insight into his condition.  He is not very active, watches TV most of the day.  He likes going to church. repeats oneself?  Endorsed, frequently. Disoriented when walking into a room?  He may confuse 1 room with the other although he denies it.***  Leaving objects?  May misplace things especially glasses, but not in unusual places***  Wandering behavior?  denies   Any  personality changes since last visit?  He has moments of irritability, better controlled with Depakote .   Any worsening depression?:  Denies Hallucinations or paranoia?  As before, he sees people down the hall, dead family members, babies but this is not frightening to him. Seizures? denies    Any sleep changes?  He sleeps well unless he has to get up to urinate.  He talks at night during his sleep.  Denies vivid dreams, REM behavior or sleepwalking   Sleep apnea?   Denies.   Any hygiene concerns?  Wife has to remind him to shower. Independent of bathing and dressing?  Endorsed  Does the patient needs help with medications?  What is in charge*** Who is in charge of the finances?  Wife is in charge   *** Any changes in appetite?  As before, he forgets that he ate and he eats again eats in smaller portions.***   Patient have trouble swallowing? Denies.   Does the patient cook? No Any headaches?   denies   Any vision changes?*** Chronic back pain?  Endorsed, he has arthritis.  He also has chronic right leg pain causing some mobility issues Ambulates with difficulty? Denies.  *** Recent falls or head injuries? Denies.     Unilateral weakness, numbness or tingling? denies   Any tremors?  Denies  *** Any anosmia?  Denies   Any incontinence of urine?  Endorsed***  Any bowel dysfunction?   Denies      Patient lives with his wife   ***  Does the patient drive? 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  The patient is accompanied by his wife who supplements the history. This is a 88 y.o. year old RH  male who has had memory issues for about 2 years, although he denies any symptoms.  Apparently, he had been referred by PCP for evaluation in September 2021, but he never told his wife about it, and in today's  visit, he adamantly refuses that he has any memory problems.  His wife reports that he is repeating the same stories and asking the same questions and this has been worse over the last 6 months.  Sometimes he does not remember where he has left objects.  He continues to drive only with his wife by his side, she denies that the patient is disoriented but she reports that he only drives short distances.  He denies having any irritability or mood changes, but his wife rolls her eyes after he states that he is happy-go-lucky person.  Denies any depression.  He admits to not sleeping well because he has to get up around 3 times at night to urinate.  He denies any sleepwalking, REM behavior, hallucinations or paranoia.  He is independent of bathing and dressing, and his wife denies any hygiene concerns.  His medications are in a pillbox, but he forgets to take several doses.  His wife is in charge of the finances for the last 5 years.  His appetite is too good and denies trouble swallowing.  He does not cook.  He ambulates without difficulty, he has some chronic right leg pain due to arthritis, and is going to be seen by orthopedics soon.  He denies any falls or head injuries.  He does not use a cane to ambulate.  He denies a history of seizure, headaches, double vision, focal numbness or tingling, unilateral weakness, tremors or anosmia.  He has been evaluated for dizziness in view of orthostatic hypotension. He denies any constipation or diarrhea.  He denies a history of sleep apnea, alcohol or tobacco.  Family history negative for Alzheimer's disease.  He has high School education.  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  Last CT of the head on 12/31/2019 showed chronic small vessel ischemic changes.     MRI of the brain 12/19/21 No evidence of acute intracranial abnormality.3. Mild-to-moderate chronic small-vessel  ischemic changes within the cerebral white matter.4. Subcentimeter chronic infarct within the superior right cerebellar hemisphere.5. Mild-to-moderate generalized cerebral atrophy.6. Comparatively mild cerebellar atrophy. PREVIOUS MEDICATIONS:   CURRENT MEDICATIONS:  Outpatient Encounter Medications as of 09/08/2024  Medication Sig   acetaminophen  (TYLENOL ) 500 MG tablet Take 500 mg by mouth every 6 (six) hours as needed (pain).   aspirin  81 MG EC tablet Take 1 tablet (81 mg total) by mouth daily.   divalproex  (DEPAKOTE ) 125 MG DR tablet Take 1 tab  twice a day   donepezil  (ARICEPT ) 10 MG tablet Take 1 tablet (10 mg total) by mouth daily.   memantine  (NAMENDA ) 10 MG tablet Take 1 tablet  twice a day   methimazole  (TAPAZOLE ) 5 MG tablet Take 0.5 tablets (2.5 mg total) by mouth every other day.  Multiple Vitamins-Minerals (CENTRUM SILVER PO) Take 1 tablet by mouth daily.   rosuvastatin  (CRESTOR ) 40 MG tablet TAKE 1 TABLET(40 MG) BY MOUTH DAILY   sertraline  (ZOLOFT ) 100 MG tablet Take 1 tablet (100 mg total) by mouth daily.   tamsulosin  (FLOMAX ) 0.4 MG CAPS capsule TAKE 1 CAPSULE(0.4 MG) BY MOUTH TWICE DAILY   No facility-administered encounter medications on file as of 09/08/2024.       10/20/2023    2:07 PM 09/09/2023   10:00 AM 03/10/2023    9:00 AM  MMSE - Mini Mental State Exam  Not completed: Unable to complete    Orientation to time  1 2  Orientation to Place  2 3  Registration  3 3  Attention/ Calculation  0 0  Recall  0 1  Language- name 2 objects  2 2  Language- repeat  1 1  Language- follow 3 step command  3 3  Language- read & follow direction  1 1  Write a sentence  1 1  Copy design  0 0  Total score  14 17       No data to display          Objective:     PHYSICAL EXAMINATION:    VITALS:  There were no vitals filed for this visit.  GEN:  The patient appears stated age and is in NAD. HEENT:  Normocephalic, atraumatic.   Neurological  examination:  General: NAD, well-groomed, appears stated age. Orientation: The patient is alert. Oriented to person, not to place and date Cranial nerves: There is good facial symmetry.The speech is fluent and clear. No aphasia or dysarthria. Fund of knowledge is reduced. Recent and remote memory are impaired. Attention and concentration are reduced. Able to name objects and repeat phrases.  Hearing is intact to conversational tone. *** Sensation: Sensation is intact to light touch throughout Motor: Strength is at least antigravity x4. DTR's 2/4 in UE/LE     Movement examination: Tone: There is normal tone in the UE/LE Abnormal movements:  no tremor.  No myoclonus.  No asterixis.   Coordination:  There is some decremation with RAM's. Normal finger to nose  Gait and Station: The patient has no*** difficulty arising out of a deep-seated chair without the use of the hands. The patient's stride length is good.  Gait is cautious and narrow.    Thank you for allowing us  the opportunity to participate in the care of this nice patient. Please do not hesitate to contact us  for any questions or concerns.   Total time spent on today's visit was *** minutes dedicated to this patient today, preparing to see patient, examining the patient, ordering tests and/or medications and counseling the patient, documenting clinical information in the EHR or other health record, independently interpreting results and communicating results to the patient/family, discussing treatment and goals, answering patient's questions and coordinating care.  Cc:  Caro Harlene POUR, NP  Camie Sevin 09/07/2024 5:39 AM

## 2024-09-08 ENCOUNTER — Ambulatory Visit (INDEPENDENT_AMBULATORY_CARE_PROVIDER_SITE_OTHER): Admitting: Physician Assistant

## 2024-09-08 ENCOUNTER — Encounter: Payer: Self-pay | Admitting: Physician Assistant

## 2024-09-08 ENCOUNTER — Ambulatory Visit: Payer: Self-pay | Admitting: Internal Medicine

## 2024-09-08 VITALS — BP 90/53 | HR 77 | Resp 20 | Ht 74.0 in | Wt 175.0 lb

## 2024-09-08 DIAGNOSIS — F0150 Vascular dementia without behavioral disturbance: Secondary | ICD-10-CM

## 2024-09-08 LAB — TSH: TSH: 3.43 m[IU]/L (ref 0.40–4.50)

## 2024-09-08 LAB — T4, FREE: Free T4: 1 ng/dL (ref 0.8–1.8)

## 2024-09-08 LAB — THYROID STIMULATING IMMUNOGLOBULIN: TSI: 173 %{baseline} — ABNORMAL HIGH (ref <140)

## 2024-09-08 LAB — T3, FREE: T3, Free: 3.5 pg/mL (ref 2.3–4.2)

## 2024-09-08 MED ORDER — METHIMAZOLE 5 MG PO TABS: 2.5000 mg | ORAL_TABLET | ORAL | 3 refills | Status: AC

## 2024-09-08 MED ORDER — DONEPEZIL HCL 10 MG PO TABS: 10.0000 mg | ORAL_TABLET | Freq: Every morning | ORAL | 3 refills | Status: AC

## 2024-09-08 MED ORDER — MEMANTINE HCL 10 MG PO TABS: ORAL_TABLET | ORAL | 11 refills | Status: AC

## 2024-09-08 MED ORDER — DIVALPROEX SODIUM 250 MG PO DR TAB: DELAYED_RELEASE_TABLET | ORAL | 3 refills | Status: AC

## 2024-09-08 NOTE — Addendum Note (Signed)
 Addended by: TRIXIE FILE on: 09/08/2024 04:34 PM   Modules accepted: Orders

## 2024-09-08 NOTE — Patient Instructions (Addendum)
 It was a pleasure to see you today at our office.   Recommendations:  Follow up in 6 months Continue donepezil   10 mg daily.   Continue Memantine  10 mg twice a day.     Depakote  250 mg Take 1 tab at night , then continue with 125 in morning for 1 week, then can 1 tab twice a day if needed  Make sure to check the hearing  Continue B12 supplements  Visit [facility] on FB, Instagram and You Tube      RECOMMENDATIONS FOR ALL PATIENTS WITH MEMORY PROBLEMS: 1. Continue to exercise (Recommend 30 minutes of walking everyday, or 3 hours every week) 2. Increase social interactions - continue going to [place] and enjoy social gatherings with friends and family 3. Eat healthy, avoid fried foods and eat more fruits and vegetables 4. Maintain adequate blood pressure, blood sugar, and blood cholesterol level. Reducing the risk of stroke and cardiovascular disease also helps promoting better memory. 5. Avoid stressful situations. Live a simple life and avoid aggravations. Organize your time and prepare for the next day in anticipation. 6. Sleep well, avoid any interruptions of sleep and avoid any distractions in the bedroom that may interfere with adequate sleep quality 7. Avoid sugar, avoid sweets as there is a strong link between excessive sugar intake, diabetes, and cognitive impairment We discussed the Mediterranean diet, which has been shown to help patients reduce the risk of progressive memory disorders and reduces cardiovascular risk. This includes eating fish, eat fruits and green leafy vegetables, nuts like almonds and hazelnuts, walnuts, and also use olive oil. Avoid fast foods and fried foods as much as possible. Avoid sweets and sugar as sugar use has been linked to worsening of memory function.  There is always a concern of gradual progression of memory problems. If this is the case, then we may need to adjust level of care according to patient needs. Support, both to the patient and  caregiver, should then be put into place.    FALL PRECAUTIONS: Be cautious when walking. Scan the area for obstacles that may increase the risk of trips and falls. When getting up in the mornings, sit up at the edge of the bed for a few minutes before getting out of bed. Consider elevating the bed at the head end to avoid drop of blood pressure when getting up. Walk always in a well-lit room (use night lights in the walls). Avoid area rugs or power cords from appliances in the middle of the walkways. Use a walker or a cane if necessary and consider physical therapy for balance exercise. Get your eyesight checked regularly.  FINANCIAL OVERSIGHT: Supervision, especially oversight when making financial decisions or transactions is also recommended.  HOME SAFETY: Consider the safety of the kitchen when operating appliances like stoves, microwave oven, and blender. Consider having supervision and share cooking responsibilities until no longer able to participate in those. Accidents with firearms and other hazards in the house should be identified and addressed as well.   ABILITY TO BE LEFT ALONE: If patient is unable to contact 911 operator, consider using LifeLine, or when the need is there, arrange for someone to stay with patients. Smoking is a fire hazard, consider supervision or cessation. Risk of wandering should be assessed by caregiver and if detected at any point, supervision and safe proof recommendations should be instituted.  MEDICATION SUPERVISION: Inability to self-administer medication needs to be constantly addressed. Implement a mechanism to ensure safe administration of the  medications.

## 2024-09-09 ENCOUNTER — Other Ambulatory Visit

## 2024-09-12 DIAGNOSIS — H18892 Other specified disorders of cornea, left eye: Secondary | ICD-10-CM | POA: Diagnosis not present

## 2024-09-12 DIAGNOSIS — H0288A Meibomian gland dysfunction right eye, upper and lower eyelids: Secondary | ICD-10-CM | POA: Diagnosis not present

## 2024-09-12 DIAGNOSIS — H04123 Dry eye syndrome of bilateral lacrimal glands: Secondary | ICD-10-CM | POA: Diagnosis not present

## 2024-09-12 DIAGNOSIS — H18412 Arcus senilis, left eye: Secondary | ICD-10-CM | POA: Diagnosis not present

## 2024-09-19 ENCOUNTER — Encounter: Payer: Self-pay | Admitting: Radiology

## 2024-09-19 ENCOUNTER — Ambulatory Visit: Admitting: Physician Assistant

## 2024-09-19 ENCOUNTER — Encounter: Payer: Self-pay | Admitting: Physician Assistant

## 2024-09-19 DIAGNOSIS — M1611 Unilateral primary osteoarthritis, right hip: Secondary | ICD-10-CM

## 2024-09-19 DIAGNOSIS — M25551 Pain in right hip: Secondary | ICD-10-CM | POA: Diagnosis not present

## 2024-09-19 NOTE — Progress Notes (Signed)
  HPI: Mr. Gasper is 88 year old male were seen for right hip pain today.  He is accompanied by his wife.  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  He is referred by Greig Cluster nurse practitioner at [facility] for his hip.  His wife states that he had an area of purple discoloration over the lateral aspect of his hip some 2 months ago which is slightly resolved over time.  She states he continues to complain of right hip pain particularly at night.  No witnessed fall onto the right hip.  He is only taking Tylenol  as needed for the hip pain. Right hip CT scan dated 08/11/2024 is reviewed shows moderate right hip degenerative changes with subchondral cyst formation superior right acetabulum.  No osseous changes.  No obvious right hip periarticular soft tissue mass.  Mild nonspecific increased density in the subcutaneous fat along the posterior aspect of the proximal femur.  MRI right hip which was ordered with and without contrast however patient was not able to tolerate contrast portion of the study.  That showed an indeterminate heterogeneous T2 signal along the peripheral lateral margin of the right gluteus maximus muscle with similar signal abnormality in the overlying adjacent subcutaneous fat.  Region measures approximately 4 0.8 x 4.3 x 2.3 cm.  Venolymphatic malformation or tissue neoplasm was felt less likely but could not be ruled out.  Felt that this could be a hematoma.  Proximal anterior compartment soft tissue of the right hip there is a multi loculated multi septated structure within the internal compartments of very intermediate to hyperintense of T2 signals measuring approximately 3.6 x 2.0 x 6.9 cm.  No significant surrounding intramuscular edema.  These findings were again nonspecific could represent Veno lymphatic malformation, posttraumatic changes or cystic lesions.  Recommend MRI without and with contrast in the near future.   Moderate osteoarthritis right hip.  Degenerative changes labrum.  Review of systems see HPI.  Physical exam: General: Pleasant male in no acute distress.  Ambulates without any assistive device. Bilateral hips: Good range of motion of both hips without pain.  Right hip posterior trochanteric region there is a small area of nummular shape with slight redness but no abnormal warmth.  Scab is over this area.  Impression: Right hip moderate arthritis Right hip lesions unknown etiology  Plan: Will have him undergo ultrasound evaluation of these hip lesions with Dr. Burnetta.  Also possible aspiration of the lesions.  He will follow-up with Dr. Vernetta after the ultrasound evaluation and possible aspiration.  Questions were encouraged and answered at length.

## 2024-09-20 ENCOUNTER — Ambulatory Visit (INDEPENDENT_AMBULATORY_CARE_PROVIDER_SITE_OTHER): Admitting: Sports Medicine

## 2024-09-20 ENCOUNTER — Other Ambulatory Visit: Payer: Self-pay

## 2024-09-20 ENCOUNTER — Encounter: Payer: Self-pay | Admitting: Sports Medicine

## 2024-09-20 DIAGNOSIS — S300XXA Contusion of lower back and pelvis, initial encounter: Secondary | ICD-10-CM

## 2024-09-20 DIAGNOSIS — F01B3 Vascular dementia, moderate, with mood disturbance: Secondary | ICD-10-CM

## 2024-09-20 DIAGNOSIS — M25551 Pain in right hip: Secondary | ICD-10-CM

## 2024-09-20 NOTE — Progress Notes (Signed)
 Timothy Gallagher - 88 y.o. male MRN [id number]  Date of birth: February 26, 1936  Office Visit Note: Visit Date: 09/20/2024 PCP: Caro Harlene POUR, NP Referred by: Caro Harlene POUR, NP  Subjective: Chief Complaint  Patient presents with   Right Hip - Pain   HPI: Timothy Gallagher is a pleasant 88 y.o. male who presents today for ultrasonic evaluation and possible aspiration of potential hematoma surrounding right lateral hip and right gluteus maximus.  Patient saw Bertrum Gaskins, PA with orthopedic surgery yesterday who referred him to us .  Per recent MRI of right hip it showed a Region of approximately 4.8 x 4.3 x 2.3 cm intermediate heterogenous T2 signal intensity along the peripheral lateral marginof the right gluteus maximus muscle with similar signal abnormality in the overlying adjacent subcutaneous fat. These findings could reflect an organizing or chronic hematoma. Recommend correlation with recent history of trauma His wife noticed an area of purple discoloration over the lateral aspect of his hip some 2 months ago which is slightly resolved over time. She states he continues to complain of right hip pain particularly at night. No witnessed fall onto the right hip. He is only taking Tylenol  as needed for the hip pain.  Family hoping for further evaluation of right hip/gluteal pain via ultrasonic evaluation.  Pertinent ROS were reviewed with the patient and found to be negative unless otherwise specified above in HPI.   Assessment & Plan: Visit Diagnoses:  1. Pain in right hip   2. Hematoma of right buttock     Plan: Ultrasonic examination and subsequent ultrasound-guided aspiration consistent with previously presumed diagnosis of gluteus maximus hematoma radiating from right greater trochanter.  A small amount of coagulated blood was aspirated under ultrasound guidance confirming this diagnosis.  Given chronicity of injury (occurring ~2 months ago) it is likely that hematoma has  almost fully coagulated and is no longer easily amenable to aspiration at this time.  Given that ultrasound evaluation and aspiration coincides with previously presumed diagnosis of right hip/gluteal hematoma no further evaluation needed at this time.  Patient and family do endorse gradual improvement in symptoms over the past couple of months.  Discussed it may take an additional week/months for full resolution of symptoms to occur.  If patient experiences any worsening of symptoms he is to follow-up with either myself, PA-C Tory or Dr. Jerri.  Patient and family (wife and daughter at bedside) agree with treatment plan and have no further questions or concerns at this time.  They will follow-up with us  for further management of care as needed.  Follow-up: No follow-ups on file.   Meds & Orders: No orders of the defined types were placed in this encounter.   Orders Placed This Encounter  Procedures   US  Extrem Low Right Ltd     Procedures: See procedure listed under imaging     Clinical History: No specialty comments available.  He reports that he quit smoking about 25 years ago. His smoking use included cigarettes. He started smoking about 67 years ago. He has a 21 pack-year smoking history. He has never used smokeless tobacco.  Recent Labs    01/12/24 1056 06/27/24 1106  HGBA1C 6.4 6.2*    Objective:   Vital Signs: There were no vitals taken for this visit.  Physical Exam  Gen: Well-appearing, in no acute distress; non-toxic CV: Well-perfused. Warm.  Resp: Breathing unlabored on room air; no wheezing. Psych: Fluid speech in conversation; appropriate affect; short-term memory consistent with advanced  dementia and consistent with prior diagnoses  Ortho Exam - Limited orthopedic evaluation performed given visit primarily for ultrasonic evaluation and aspiration of right hip/gluteal presumed hematoma.  On limited evaluation patient has no significant pain with palpation over right  greater trochanter or right gluteus.  Still has small bruise with scabbing directly over right greater trochanter.  No evidence of increased erythema or infection present.  Patient has full active/passive range of motion of right hip.    Imaging: US  Extrem Low Right Ltd Result Date: 09/20/2024 Ultrasound evaluation of right greater trochanter extending into right glute maximum shows hypoechoic collection of fluid extending from right greater trochanter into right gluteus maximus.  While predominantly hypoechoic in nature, it does have mixed echogenicity consistent with coagulated hematoma. US -Guided right gluteal hematoma aspiration After discussion on risks/benefits/indications and informed verbal consent was obtained, a timeout was performed. The patient was lying in lateral recumbent position on exam table. Using ultrasound guidance, the collection of hypoechoic fluid in the right gluteus maximus was identified. The area overlying the presumed hematoma was then prepped with chlorhexidine  and alcohol swabs.  Injection of 5 cc of 1% lidocaine  using a 25-gauge 1.5 inch needle done for numbing.  Following sterile precautions, ultrasound was reapplied to visualize needle guidance with a 18-gauge 1.5 needle utilizing an out of-plane approach.  Successful aspiration of 2 cc of coagulated blood was removed from the region of hypoechoic fluid within the right gluteus maximus. Patient tolerated procedure well without immediate complications.     Past Medical/Family/Surgical/Social History: Medications & Allergies reviewed per EMR, new medications updated. Patient Active Problem List   Diagnosis Date Noted   Anxiety 03/25/2024   Chronic nonintractable headache 03/25/2024   Hydronephrosis 01/25/2024   Ureteral colic 01/25/2024   Graves disease 03/05/2023   Aortic atherosclerosis 01/05/2023   Hyperglycemia 07/22/2022   Bilateral nephrolithiasis 07/07/2022   Diverticular disease 07/07/2022   Iliac aneurysm  07/07/2022   Enlarged prostate 07/07/2022   Moderate mixed vascular and neurodegenerative dementia with mood disturbance (HCC) 11/28/2021   Abdominal aortic aneurysm (AAA) without rupture, unspecified part (HCC) 11/27/2021   Chronic obstructive pulmonary disease (HCC) 08/26/2019   History of atrial fibrillation 08/26/2019   Degeneration of lumbar intervertebral disc 10/21/2018   Hyperthyroidism 01/13/2018   Detached retina, left 06/30/2017   AAA (abdominal aortic aneurysm) 05/12/2017   Left sided abdominal pain 04/15/2017   Low back pain 02/06/2017   BPPV (benign paroxysmal positional vertigo) 09/01/2016   Anal fissure 04/07/2016   Internal and external bleeding hemorrhoids 01/22/2015   Insomnia 11/22/2014   Anemia 07/20/2014   CAD (coronary artery disease) of artery bypass graft 05/19/2014   BPH with obstruction/lower urinary tract symptoms 12/05/2013   Palpitations 02/11/2013   OSTEOARTHRITIS, WRIST, RIGHT 08/05/2010   ACNE ROSACEA 06/27/2009   ESOPHAGEAL STRICTURE 04/27/2009   ACTINIC KERATOSIS, HEAD 04/18/2009   NECK PAIN 09/14/2008   Hyperlipidemia 03/13/2008   NEUROPATHY, IDIOPATHIC PERIPHERAL NEC 08/11/2007   Irritable bowel syndrome 08/11/2007   Vitamin B12 deficiency 06/07/2007   Essential hypertension 04/09/2007   ALLERGIC RHINITIS 04/09/2007   Past Medical History:  Diagnosis Date   ACNE ROSACEA 06/27/2009   ACTINIC KERATOSIS, HEAD 04/18/2009   Acute maxillary sinusitis 05/14/2010   ALLERGIC RHINITIS 04/09/2007   Anal fissure 04/07/2016   B12 DEFICIENCY 06/07/2007   BACK PAIN WITH RADICULOPATHY 04/24/2008   Bilateral nephrolithiasis 07/07/2022   CT abd 06/2022 1. Nonobstructive bilateral nephrolithiasis measuring up to 5 mm on the right and 6 mm on the  left.   Cancer (HCC)    skin   CHEST WALL PAIN, ACUTE 06/15/2009   Chronic maxillary sinusitis 05/29/2008   COLITIS 04/27/2009   COLONIC POLYPS, HX OF 04/27/2009   tubular adenomas   Coronary artery disease  05/12/2014   Cath 05/12/2014 w/ severe 3-vessel CAD and preserved LV function, EF 55%   DERMATITIS, ATOPIC 10/12/2007   Diverticular disease 07/07/2022   CT abd 06/2022 . Colonic diverticulosis with no acute diverticulitis.   DIVERTICULOSIS, COLON 04/27/2009   ECCHYMOSES, SPONTANEOUS 06/27/2008   Elevated sedimentation rate 05/02/2009   ESOPHAGEAL STRICTURE 04/27/2009   GASTRITIS, CHRONIC 04/27/2009   History of kidney stones    HYPERLIPIDEMIA 03/13/2008   HYPERTENSION 04/09/2007   Hyperthyroidism    Iliac aneurysm 07/07/2022   CT abd 06/2022 . Aneurysmal left common iliac artery (1.6 cm).     Internal bleeding hemorrhoids 01/22/2015   01/22/2015 Seen at anoscopy, grade 1 all 3 positions    Irritable bowel syndrome 08/11/2007   KIDNEY DISEASE 04/09/2007   NECK PAIN 09/14/2008   NEUROPATHY, IDIOPATHIC PERIPHERAL NEC 08/11/2007   OSTEOARTHRITIS, WRIST, RIGHT 08/05/2010   Postoperative delirium 05/20/2014   S/P CABG x 4 05/19/2014   LIMA to LAD, SVG to diag, SVG to OM, SVG to PDA, EVH via right thigh and leg   Family History  Problem Relation Age of Onset   Hernia Mother    Heart disease Father        smoker   COPD Father    Heart attack Father    Heart attack Brother    Early death Daughter    Colon cancer Neg Hx    Esophageal cancer Neg Hx    Pancreatic cancer Neg Hx    Past Surgical History:  Procedure Laterality Date   CARDIAC CATHETERIZATION     COLONOSCOPY     CORONARY ARTERY BYPASS GRAFT N/A 05/19/2014   Procedure: CORONARY ARTERY BYPASS GRAFTING (CABG);  Surgeon: Sudie VEAR Laine, MD;  Location: [facility] OR;  Service: Open Heart Surgery;  Laterality: N/A;  Times 4 using left internal mammary artery and endoscopically harvested right saphenous vein   CYSTOSCOPY/URETEROSCOPY/HOLMIUM LASER/STENT PLACEMENT Bilateral 04/29/2024   Procedure: CYSTOSCOPY/URETEROSCOPY/HOLMIUM LASER/STENT PLACEMENT;  Surgeon: Watt Rush, MD;  Location: [facility] ORS;  Service: Urology;  Laterality: Bilateral;   CYSTOSCOPY/RIGHT URETEROSCOPY/HOLMIUM LASER/STENT PLACEMENT/RETROGRADE PYELOGRAM   ESOPHAGOGASTRODUODENOSCOPY     EXTRACORPOREAL SHOCK WAVE LITHOTRIPSY Right 01/29/2024   Procedure: LITHOTRIPSY, ESWL;  Surgeon: Lovie Arlyss CROME, MD;  Location: [facility];  Service: Urology;  Laterality: Right;   EYE SURGERY     FINGER SURGERY     cut off end of finger   FLEXIBLE SIGMOIDOSCOPY     HEMORRHOID BANDING     HERNIA REPAIR     INCISION AND DRAINAGE WOUND WITH FOREIGN BODY REMOVAL Left 12/20/2013   Procedure: INCISION AND DRAINAGE LEFT INDEX FINGER;  Surgeon: Franky JONELLE Curia, MD;  Location: [facility];  Service: Orthopedics;  Laterality: Left;   INTRAOPERATIVE TRANSESOPHAGEAL ECHOCARDIOGRAM N/A 05/19/2014   Procedure: INTRAOPERATIVE TRANSESOPHAGEAL ECHOCARDIOGRAM;  Surgeon: Sudie VEAR Laine, MD;  Location: [facility] OR;  Service: Open Heart Surgery;  Laterality: N/A;   lamenectomy     LEFT HEART CATHETERIZATION WITH CORONARY ANGIOGRAM N/A 05/12/2014   Procedure: LEFT HEART CATHETERIZATION WITH CORONARY ANGIOGRAM;  Surgeon: Victory LELON Claudene DOUGLAS, MD;  Location: [facility] CATH LAB;  Service: Cardiovascular;  Laterality: N/A;   LUMBAR FUSION     TONSILLECTOMY     VARICOSE VEIN SURGERY Left  Social History   Occupational History   Occupation: retired    Associate Professor: RETIRED    Comment: from [employer]  Tobacco Use   Smoking status: Former    Current packs/day: 0.00    Average packs/day: 0.5 packs/day for 42.0 years (21.0 ttl pk-yrs)    Types: Cigarettes    Start date: 11/17/1956    Quit date: 11/17/1998    Years since quitting: 25.8   Smokeless tobacco: Never  Vaping Use   Vaping status: Never Used  Substance and Sexual Activity   Alcohol use: No   Drug use: No   Sexual activity: Yes

## 2024-09-21 ENCOUNTER — Encounter: Payer: Self-pay | Admitting: Sports Medicine

## 2024-10-05 ENCOUNTER — Ambulatory Visit (HOSPITAL_COMMUNITY)
Admission: RE | Admit: 2024-10-05 | Discharge: 2024-10-05 | Disposition: A | Discharge status: Home / Self Care | Source: Ambulatory Visit | Attending: Cardiology | Admitting: Cardiology

## 2024-10-05 DIAGNOSIS — I7121 Aneurysm of the ascending aorta, without rupture: Secondary | ICD-10-CM | POA: Diagnosis not present

## 2024-10-05 DIAGNOSIS — J929 Pleural plaque without asbestos: Secondary | ICD-10-CM | POA: Diagnosis not present

## 2024-10-05 DIAGNOSIS — J439 Emphysema, unspecified: Secondary | ICD-10-CM | POA: Diagnosis not present

## 2024-10-05 DIAGNOSIS — I700 Atherosclerosis of aorta: Secondary | ICD-10-CM | POA: Diagnosis not present

## 2024-10-11 ENCOUNTER — Encounter: Payer: Self-pay | Admitting: Cardiology

## 2024-10-12 ENCOUNTER — Ambulatory Visit

## 2024-10-12 VITALS — BP 119/72 | HR 75 | Resp 18 | Ht 74.0 in | Wt 177.0 lb

## 2024-10-12 DIAGNOSIS — I7121 Aneurysm of the ascending aorta, without rupture: Secondary | ICD-10-CM

## 2024-10-12 NOTE — Patient Instructions (Signed)
 Risk Modification in those with ascending thoracic aortic aneurysm:   Continue control of blood pressure (prefer BP 130/80 or less)   2. Avoid fluoroquinolone antibiotics (I.e Ciprofloxacin , Avelox, Levofloxacin, Ofloxacin)   3.  Use of statin (to decrease cardiovascular risk)   4.  Exercise and activity limitations is individualized, but in general, contact sports are to be avoided and one should avoid heavy lifting (defined as half of ideal body weight) and exercises involving sustained Valsalva maneuver.   5.  Follow-up in ONE YEAR with CT chest.

## 2024-10-12 NOTE — Progress Notes (Signed)
 [address] [place] [address]             [phone]            MADDOCK FINIGAN [id number] 1936-09-23   History of Present Illness:  Timothy Gallagher is an 88 year old man with medical history of hypertension, CAD, abdominal aortic aneurysm, COPD, graves disease, BPPV, osteoarthritis, dementia, BPH, and hyperlipidemia who presents for continued follow up of ascending thoracic aortic aneurysm. Aneurysm measured 4.3 cm on recent CTA of chest.   He presents to the clinic today with his wife. He does have dementia and his short term memory has started to decline.  He usually forgets things discussed after a few minutes. His blood pressure is well controlled without the use of medications. He states that he is active around the house but does not exercise or lift.  He denies chest pain, shortness of breath and lower leg edema.     Current Outpatient Medications on File Prior to Visit  Medication Sig Dispense Refill   acetaminophen  (TYLENOL ) 500 MG tablet Take 500 mg by mouth every 6 (six) hours as needed (pain).     aspirin  81 MG EC tablet Take 1 tablet (81 mg total) by mouth daily.     divalproex  (DEPAKOTE ) 250 MG DR tablet Take 1 tab  twice a day 180 tablet 3   donepezil  (ARICEPT ) 10 MG tablet Take 1 tablet (10 mg total) by mouth every morning. 90 tablet 3   memantine  (NAMENDA ) 10 MG tablet Take 1 tablet  twice a day 60 tablet 11   methimazole  (TAPAZOLE ) 5 MG tablet Take 0.5 tablets (2.5 mg total) by mouth every other day. 45 tablet 3   Multiple Vitamins-Minerals (CENTRUM SILVER PO) Take 1 tablet by mouth daily.     rosuvastatin  (CRESTOR ) 40 MG tablet TAKE 1 TABLET(40 MG) BY MOUTH DAILY 90 tablet 3   sertraline  (ZOLOFT ) 100 MG tablet Take 1 tablet (100 mg total) by mouth daily. 90 tablet 1   tamsulosin  (FLOMAX ) 0.4 MG CAPS capsule TAKE 1 CAPSULE(0.4 MG) BY MOUTH TWICE DAILY 180 capsule 1   No current facility-administered medications on file prior to  visit.     ROS: Review of Systems  Constitutional: Negative.  Negative for malaise/fatigue.  Respiratory: Negative.  Negative for cough and shortness of breath.   Cardiovascular: Negative.  Negative for chest pain, palpitations and leg swelling.     BP 119/72   Pulse 75   Resp 18   Ht 6' 2 (1.88 m)   Wt 177 lb (80.3 kg)   SpO2 95%   BMI 22.73 kg/m   Physical Exam Constitutional:      Appearance: Normal appearance.  HENT:     Head: Normocephalic and atraumatic.  Cardiovascular:     Rate and Rhythm: Normal rate and regular rhythm.     Heart sounds: Normal heart sounds, S1 normal and S2 normal.  Pulmonary:     Effort: Pulmonary effort is normal.     Breath sounds: Normal breath sounds.  Skin:    General: Skin is warm and dry.  Neurological:     General: No focal deficit present.     Mental Status: He is alert and oriented to person, place, and time.      Imaging: CLINICAL DATA:  Follow-up thoracic aortic aneurysm.   EXAM: CT CHEST WITHOUT CONTRAST   TECHNIQUE: Multidetector CT imaging of the chest  was performed following the standard protocol without IV contrast.   RADIATION DOSE REDUCTION: This exam was performed according to the departmental dose-optimization program which includes automated exposure control, adjustment of the mA and/or kV according to patient size and/or use of iterative reconstruction technique.   COMPARISON:  08/13/2023   FINDINGS: Cardiovascular: Heart is normal size. Median sternotomy wires are present. Evidence of prior CABG. Pulmonary arterial system is unremarkable on this noncontrast exam.   Ascending thoracic aorta measures approximately 4.3 cm in AP diameter without significant change from the prior exam.   Aortic root at the sinus of Valsalva measures 4.1 cm as the sinotubular junction measures 3.7 cm. Aortic arch measures 3.7 cm. Descending thoracic aorta measures 3.3 cm. There is mild calcified plaque throughout the  descending thoracic aorta.   Remaining vascular structures are unremarkable.   Mediastinum/Nodes: No concerning mediastinal or hilar adenopathy. Remaining mediastinal structures are unremarkable.   Lungs/Pleura: Lungs are adequately inflated without acute airspace consolidation or effusion. There is paraseptal emphysematous disease over the apices. Calcified pleural plaques over the posterior right mid to upper thorax. Minimal stable scarring over the lingula. Tiny calcified granuloma over the posterior right upper lobe. Airways are unremarkable.   Upper Abdomen: Calcified plaque over the visualized upper abdominal aorta. Remaining visualized upper abdominal structures are unchanged as there is no acute findings. Stable 2.2 cm upper pole left renal cyst. Couple small subcentimeter liver hypodensities too small to characterize but likely cysts and unchanged.   Musculoskeletal: Two adjacent vertebral body Mangione is over the midthoracic spine unchanged. Bony structures are otherwise unchanged.   IMPRESSION: 1. Stable 4.3 cm ascending thoracic aortic aneurysm. Recommend annual imaging followup by CTA or MRA. This recommendation follows 2010 ACCF/AHA/AATS/ACR/ASA/SCA/SCAI/SIR/STS/SVM Guidelines for the Diagnosis and Management of Patients with Thoracic Aortic Disease. Circulation. 2010; 121: Z733-z630. Aortic aneurysm NOS (ICD10-I71.9) 2. Stable 2.2 cm upper pole left renal cyst. 3. Aortic atherosclerosis. 4. Apical emphysematous disease. 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   Aortic Atherosclerosis (ICD10-I70.0) and Emphysema (ICD10-J43.9).     Electronically Signed   By: Toribio Agreste M.D.   On: 10/05/2024 10:51       A/P: Aneurysm of ascending aorta without rupture -4.3 cm ascending thoracic aortic aneurysm on CTA of chest. -We discussed the natural history and and risk factors for growth of ascending aortic  aneurysms. Discussed recommendations to minimize the risk of further expansion or dissection including careful blood pressure control, avoidance of contact sports and heavy lifting, attention to lipid management.  We covered the importance of continued smoking cessation.  The patient does not yet meet surgical criteria of >5.5cm. The patient is aware of signs and symptoms of aortic dissection and when to present to the emergency department   -Follow up in one year with CT scan of chest without contrast for continued surveillance    Risk Modification:  Statin:  rosuvastatin   Smoking cessation instruction/counseling given:  commended patient for quitting and reviewed strategies for preventing relapses  Patient was counseled on importance of Blood Pressure Control  They are instructed to contact their Primary Care Physician if they start to have blood pressure readings over 130s/90s. Do not ever stop blood pressure medications on your own, unless instructed by healthcare professional.  Please avoid use of Fluoroquinolones as this can potentially increase your risk of Aortic Rupture and/or Dissection  Patient educated on signs and symptoms of Aortic Dissection, handout also provided in AVS  Manuelita CHRISTELLA Rough, PA-C 10/12/24

## 2024-11-25 ENCOUNTER — Encounter: Payer: Self-pay | Admitting: Nurse Practitioner

## 2024-11-28 ENCOUNTER — Ambulatory Visit: Payer: Self-pay | Admitting: Nurse Practitioner

## 2024-11-28 ENCOUNTER — Encounter: Payer: Self-pay | Admitting: Nurse Practitioner

## 2024-11-28 VITALS — BP 122/70 | HR 68 | Temp 97.9°F | Ht 74.0 in | Wt 179.0 lb

## 2024-11-28 DIAGNOSIS — F419 Anxiety disorder, unspecified: Secondary | ICD-10-CM | POA: Diagnosis not present

## 2024-11-28 DIAGNOSIS — E785 Hyperlipidemia, unspecified: Secondary | ICD-10-CM | POA: Diagnosis not present

## 2024-11-28 DIAGNOSIS — N138 Other obstructive and reflux uropathy: Secondary | ICD-10-CM | POA: Diagnosis not present

## 2024-11-28 DIAGNOSIS — E0590 Thyrotoxicosis, unspecified without thyrotoxic crisis or storm: Secondary | ICD-10-CM | POA: Diagnosis not present

## 2024-11-28 DIAGNOSIS — I25810 Atherosclerosis of coronary artery bypass graft(s) without angina pectoris: Secondary | ICD-10-CM | POA: Diagnosis not present

## 2024-11-28 DIAGNOSIS — N401 Enlarged prostate with lower urinary tract symptoms: Secondary | ICD-10-CM

## 2024-11-28 DIAGNOSIS — R739 Hyperglycemia, unspecified: Secondary | ICD-10-CM

## 2024-11-28 MED ORDER — BUSPIRONE HCL 5 MG PO TABS: 5.0000 mg | ORAL_TABLET | Freq: Two times a day (BID) | ORAL | 1 refills | Status: AC

## 2024-11-28 NOTE — Progress Notes (Signed)
 "   Careteam: Patient Care Team: Caro Harlene POUR, NP as PCP - General (Geriatric Medicine) Jeffrie Oneil BROCKS, MD as PCP - Cardiology (Cardiology) Gaston Hamilton, MD as Consulting Physician (Urology) Regenia, Prentice Clack, MD as Consulting Physician (Ophthalmology) Anner Alm ORN, MD as Consulting Physician (Cardiology) Dina Camie FORBES DEVONNA (Neurology) Trixie File, MD as Consulting Physician (Internal Medicine)  PLACE OF SERVICE:  [facility]  Advanced Directive information Does Patient Have a Medical Advance Directive?: Yes, Type of Advance Directive: Healthcare Power of Attorney, Does patient want to make changes to medical advance directive?: No - Patient declined  Allergies[1]  Chief Complaint  Patient presents with   Medical Management of Chronic Issues    5 month follow-up. Discussed need for covid booster (declined) and AWV (schedule at checkout)     HPI:  Discussed the use of AI scribe software for clinical note transcription with the patient, who gave verbal consent to proceed.  History of Present Illness Timothy Gallagher is an 89 year old male presents for routine follow-up and lab work update. He is accompanied by his wife.  He is currently on Crestor  for elevated cholesterol, which was well controlled last year. He has a history of an ascending thoracic aortic aneurysm and recently saw vascular, following with CT yearsly  He is taking Flomax , one capsule twice daily, for prostate issues. No trouble with urination, although his wife notes he gets up about twice a night but able to go back to sleep.   He experiences limb jerking during sleep, which sometimes wakes him and his wife. This occurs some nights with jerking every minute. He reports not sleeping well some nights and waking up late in the day. Reports he will sleep late and wants go to bed early  He is on Zoloft  for anxiety and depression, which he feels is under control. No feelings of anxiety,  although his wife notes occasional agitation which can be throughout the day and does occur daily   He is taking Depakote , one tablet twice a day, but does not notice a difference in his symptoms of agitation. He is also on Namenda , 10 mg twice a day, and Aricept  for memory loss. His wife notes he frequently forgets things, such as asking about the newspaper multiple times in a short period.  He had a hematoma on his hip, which was drained by an orthopedic doctor. The hematoma has resolved. He does not recall any trauma to the area.   Review of Systems:  Review of Systems  Constitutional:  Negative for chills, fever and weight loss.  HENT:  Negative for tinnitus.   Respiratory:  Negative for cough, sputum production and shortness of breath.   Cardiovascular:  Negative for chest pain, palpitations and leg swelling.  Gastrointestinal:  Negative for abdominal pain, constipation, diarrhea and heartburn.  Genitourinary:  Negative for dysuria, frequency and urgency.  Musculoskeletal:  Negative for back pain, falls, joint pain and myalgias.  Skin: Negative.   Neurological:  Negative for dizziness and headaches.  Psychiatric/Behavioral:  Negative for depression and memory loss. The patient is nervous/anxious. The patient does not have insomnia.     Past Medical History:  Diagnosis Date   ACNE ROSACEA 06/27/2009   ACTINIC KERATOSIS, HEAD 04/18/2009   Acute maxillary sinusitis 05/14/2010   ALLERGIC RHINITIS 04/09/2007   Anal fissure 04/07/2016   B12 DEFICIENCY 06/07/2007   BACK PAIN WITH RADICULOPATHY 04/24/2008   Bilateral nephrolithiasis 07/07/2022   CT abd 06/2022 1. Nonobstructive bilateral  nephrolithiasis measuring up to 5 mm on the right and 6 mm on the left.   Cancer [facility])    skin   CHEST WALL PAIN, ACUTE 06/15/2009   Chronic maxillary sinusitis 05/29/2008   COLITIS 04/27/2009   COLONIC POLYPS, HX OF 04/27/2009   tubular adenomas   Coronary artery disease 05/12/2014   Cath  05/12/2014 w/ severe 3-vessel CAD and preserved LV function, EF 55%   DERMATITIS, ATOPIC 10/12/2007   Diverticular disease 07/07/2022   CT abd 06/2022 . Colonic diverticulosis with no acute diverticulitis.   DIVERTICULOSIS, COLON 04/27/2009   ECCHYMOSES, SPONTANEOUS 06/27/2008   Elevated sedimentation rate 05/02/2009   ESOPHAGEAL STRICTURE 04/27/2009   GASTRITIS, CHRONIC 04/27/2009   History of kidney stones    HYPERLIPIDEMIA 03/13/2008   HYPERTENSION 04/09/2007   Hyperthyroidism    Iliac aneurysm 07/07/2022   CT abd 06/2022 . Aneurysmal left common iliac artery (1.6 cm).     Internal bleeding hemorrhoids 01/22/2015   01/22/2015 Seen at anoscopy, grade 1 all 3 positions    Irritable bowel syndrome 08/11/2007   KIDNEY DISEASE 04/09/2007   NECK PAIN 09/14/2008   NEUROPATHY, IDIOPATHIC PERIPHERAL NEC 08/11/2007   OSTEOARTHRITIS, WRIST, RIGHT 08/05/2010   Postoperative delirium 05/20/2014   S/P CABG x 4 05/19/2014   LIMA to LAD, SVG to diag, SVG to OM, SVG to PDA, EVH via right thigh and leg   Past Surgical History:  Procedure Laterality Date   CARDIAC CATHETERIZATION     COLONOSCOPY     CORONARY ARTERY BYPASS GRAFT N/A 05/19/2014   Procedure: CORONARY ARTERY BYPASS GRAFTING (CABG);  Surgeon: Sudie VEAR Laine, MD;  Location: [facility] OR;  Service: Open Heart Surgery;  Laterality: N/A;  Times 4 using left internal mammary artery and endoscopically harvested right saphenous vein   CYSTOSCOPY/URETEROSCOPY/HOLMIUM LASER/STENT PLACEMENT Bilateral 04/29/2024   Procedure: CYSTOSCOPY/URETEROSCOPY/HOLMIUM LASER/STENT PLACEMENT;  Surgeon: Watt Rush, MD;  Location: [facility] ORS;  Service: Urology;  Laterality: Bilateral;  CYSTOSCOPY/RIGHT URETEROSCOPY/HOLMIUM LASER/STENT PLACEMENT/RETROGRADE PYELOGRAM   ESOPHAGOGASTRODUODENOSCOPY     EXTRACORPOREAL SHOCK WAVE LITHOTRIPSY Right 01/29/2024   Procedure: LITHOTRIPSY, ESWL;  Surgeon: Lovie Arlyss CROME, MD;  Location: [facility] ORS;  Service: Urology;  Laterality: Right;    EYE SURGERY     FINGER SURGERY     cut off end of finger   FLEXIBLE SIGMOIDOSCOPY     HEMORRHOID BANDING     HERNIA REPAIR     INCISION AND DRAINAGE WOUND WITH FOREIGN BODY REMOVAL Left 12/20/2013   Procedure: INCISION AND DRAINAGE LEFT INDEX FINGER;  Surgeon: Franky JONELLE Curia, MD;  Location: [facility] ORS;  Service: Orthopedics;  Laterality: Left;   INTRAOPERATIVE TRANSESOPHAGEAL ECHOCARDIOGRAM N/A 05/19/2014   Procedure: INTRAOPERATIVE TRANSESOPHAGEAL ECHOCARDIOGRAM;  Surgeon: Sudie VEAR Laine, MD;  Location: [facility] OR;  Service: Open Heart Surgery;  Laterality: N/A;   lamenectomy     LEFT HEART CATHETERIZATION WITH CORONARY ANGIOGRAM N/A 05/12/2014   Procedure: LEFT HEART CATHETERIZATION WITH CORONARY ANGIOGRAM;  Surgeon: Victory LELON Claudene DOUGLAS, MD;  Location: [facility] CATH LAB;  Service: Cardiovascular;  Laterality: N/A;   LUMBAR FUSION     TONSILLECTOMY     VARICOSE VEIN SURGERY Left    Social History:   reports that he quit smoking about 26 years ago. His smoking use included cigarettes. He started smoking about 68 years ago. He has a 21 pack-year smoking history. He has never used smokeless tobacco. He reports that he does not drink alcohol and does not use drugs.  Family History  Problem Relation Age  of Onset   Hernia Mother    Heart disease Father        smoker   COPD Father    Heart attack Father    Heart attack Brother    Early death Daughter    Colon cancer Neg Hx    Esophageal cancer Neg Hx    Pancreatic cancer Neg Hx     Medications: Patient's Medications  New Prescriptions   No medications on file  Previous Medications   ACETAMINOPHEN  (TYLENOL ) 500 MG TABLET    Take 500 mg by mouth every 6 (six) hours as needed (pain).   ASPIRIN  81 MG EC TABLET    Take 1 tablet (81 mg total) by mouth daily.   DIVALPROEX  (DEPAKOTE ) 250 MG DR TABLET    Take 1 tab  twice a day   DONEPEZIL  (ARICEPT ) 10 MG TABLET    Take 1 tablet (10 mg total) by mouth every morning.   MEMANTINE  (NAMENDA ) 10 MG TABLET     Take 1 tablet  twice a day   METHIMAZOLE  (TAPAZOLE ) 5 MG TABLET    Take 0.5 tablets (2.5 mg total) by mouth every other day.   MULTIPLE VITAMINS-MINERALS (CENTRUM SILVER PO)    Take 1 tablet by mouth daily.   ROSUVASTATIN  (CRESTOR ) 40 MG TABLET    TAKE 1 TABLET(40 MG) BY MOUTH DAILY   SERTRALINE  (ZOLOFT ) 100 MG TABLET    Take 1 tablet (100 mg total) by mouth daily.   TAMSULOSIN  (FLOMAX ) 0.4 MG CAPS CAPSULE    TAKE 1 CAPSULE(0.4 MG) BY MOUTH TWICE DAILY  Modified Medications   No medications on file  Discontinued Medications   No medications on file    Physical Exam:  Vitals:   11/28/24 0804  BP: 122/70  Pulse: 68  Temp: 97.9 F (36.6 C)  SpO2: 97%  Weight: 179 lb (81.2 kg)  Height: 6' 2 (1.88 m)   Body mass index is 22.98 kg/m. Wt Readings from Last 3 Encounters:  11/28/24 179 lb (81.2 kg)  10/12/24 177 lb (80.3 kg)  09/08/24 175 lb (79.4 kg)    Physical Exam Constitutional:      General: He is not in acute distress.    Appearance: He is well-developed. He is not diaphoretic.  HENT:     Head: Normocephalic and atraumatic.     Right Ear: External ear normal.     Left Ear: External ear normal.     Mouth/Throat:     Pharynx: No oropharyngeal exudate.  Eyes:     Conjunctiva/sclera: Conjunctivae normal.     Pupils: Pupils are equal, round, and reactive to light.  Cardiovascular:     Rate and Rhythm: Normal rate and regular rhythm.     Heart sounds: Normal heart sounds.  Pulmonary:     Effort: Pulmonary effort is normal.     Breath sounds: Normal breath sounds.  Abdominal:     General: Bowel sounds are normal.     Palpations: Abdomen is soft.  Musculoskeletal:        General: No tenderness.     Cervical back: Normal range of motion and neck supple.     Right lower leg: No edema.     Left lower leg: No edema.  Skin:    General: Skin is warm and dry.  Neurological:     Mental Status: He is alert. Mental status is at baseline.  Psychiatric:        Mood and  Affect: Mood normal.  Labs reviewed: Basic Metabolic Panel: Recent Labs    01/12/24 1056 01/25/24 1149 03/25/24 1039 04/25/24 1018 06/27/24 1106 09/06/24 1020  NA 141   < > 142 140 143  --   K 4.0   < > 4.6 4.6 4.6  --   CL 104   < > 106 106 107  --   CO2 30   < > 29 24 29   --   GLUCOSE 93   < > 91 92 92  --   BUN 16   < > 11 16 14   --   CREATININE 0.94   < > 0.76 0.79 0.92  --   CALCIUM  8.7   < > 8.8 8.9 8.8  --   TSH 1.79  --  2.34  --   --  3.43   < > = values in this interval not displayed.   Liver Function Tests: Recent Labs    01/12/24 1056 01/25/24 1149 03/25/24 1039  AST 19 37 18  ALT 11 22 10   ALKPHOS 72 65  --   BILITOT 0.5 0.9 0.5  PROT 6.6 7.3 6.3  ALBUMIN  3.7 3.5  --    Recent Labs    01/25/24 1149  LIPASE 32   No results for input(s): AMMONIA in the last 8760 hours. CBC: Recent Labs    01/12/24 1056 01/25/24 1149 03/25/24 1039 04/25/24 1018 06/27/24 1106  WBC 5.3   < > 6.3 6.9 5.5  NEUTROABS 3.1  --  3,597  --  3,174  HGB 12.3*   < > 11.4* 12.1* 12.0*  HCT 38.6*   < > 35.6* 39.6 38.5  MCV 85.9   < > 87.3 89.2 86.7  PLT 179.0   < > 188 147* 176   < > = values in this interval not displayed.   Lipid Panel: Recent Labs    01/12/24 1056  CHOL 115  HDL 53.10  LDLCALC 46  TRIG 78.0  CHOLHDL 2   TSH: Recent Labs    01/12/24 1056 03/25/24 1039 09/06/24 1020  TSH 1.79 2.34 3.43   A1C: Lab Results  Component Value Date   HGBA1C 6.2 (H) 06/27/2024     Assessment/Plan Assessment and Plan Assessment & Plan Dementia with behaviors.  Memory loss with repetitive questioning and limb movements during sleep. On Namenda  and Aricept  to slow progression. - Continue Namenda  10 mg twice daily. - Continue Aricept .  Anxiety disorder Anxiety persists despite maximum Zoloft . Buspar  added for mood and anxiety. - Started Buspar  5 mg twice daily. - Continue Zoloft  100 mg daily.  Benign prostatic hyperplasia with lower urinary  tract symptoms No difficulty urinating, nocturia twice nightly. On Flomax . - Continue Flomax  as prescribed.  Hyperlipidemia Previously controlled on Crestor . Cholesterol levels need updating. - Ordered lab work to update cholesterol levels. - Continue Crestor  as prescribed.  Coronary artery disease, post coronary bypass No chest pain. Blood pressure and cholesterol controlled. - Continue current management and monitoring.  Ascending thoracic aortic aneurysm Blood pressure and cholesterol controlled. Regular follow-up with specialist. - Continue regular follow-up with cardiac thoracic specialist. - Ensure blood pressure and cholesterol remain controlled.  Hyperthyroidism Under regular follow-up with endocrinologist. Continues on tapazole .  - Continue follow-up with endocrinologist.  General Health Maintenance Overdue for annual wellness visit. - Scheduled annual wellness visit.   Return in about 3 months (around 02/26/2025) for routine follow up.  Suzzanne Brunkhorst K. Caro BODILY [facility] [phone]     [1]  Allergies Allergen Reactions   Lipitor  [Atorvastatin ] Other (See Comments)    REACTION: nausea and blurred vision   Mycophenolate Mofetil Other (See Comments)    mycophenolate mofetil   Trazodone  And Nefazodone Other (See Comments)    dizzy   Ciprofloxacin  Swelling   Mycophenolate Mofetil Other (See Comments)    REACTION: unspecified   Amoxicillin Rash   Penicillins Rash   "

## 2024-11-28 NOTE — Patient Instructions (Addendum)
 1.) Schedule annual wellness visit at check out   2.) ADD buspar  5 mg by mouth twice daily to help with mood

## 2024-11-29 ENCOUNTER — Ambulatory Visit: Payer: Self-pay | Admitting: Nurse Practitioner

## 2024-11-29 LAB — CBC WITH DIFFERENTIAL/PLATELET
Absolute Lymphocytes: 1485 {cells}/uL (ref 850–3900)
Absolute Monocytes: 502 {cells}/uL (ref 200–950)
Basophils Absolute: 22 {cells}/uL (ref 0–200)
Basophils Relative: 0.4 %
Eosinophils Absolute: 211 {cells}/uL (ref 15–500)
Eosinophils Relative: 3.9 %
HCT: 37.4 % — ABNORMAL LOW (ref 39.4–51.1)
Hemoglobin: 11.7 g/dL — ABNORMAL LOW (ref 13.2–17.1)
MCH: 26.3 pg — ABNORMAL LOW (ref 27.0–33.0)
MCHC: 31.3 g/dL — ABNORMAL LOW (ref 31.6–35.4)
MCV: 84 fL (ref 81.4–101.7)
MPV: 11.7 fL (ref 7.5–12.5)
Monocytes Relative: 9.3 %
Neutro Abs: 3181 {cells}/uL (ref 1500–7800)
Neutrophils Relative %: 58.9 %
Platelets: 147 Thousand/uL (ref 140–400)
RBC: 4.45 Million/uL (ref 4.20–5.80)
RDW: 14.1 % (ref 11.0–15.0)
Total Lymphocyte: 27.5 %
WBC: 5.4 Thousand/uL (ref 3.8–10.8)

## 2024-11-29 LAB — LIPID PANEL
Cholesterol: 108 mg/dL
HDL: 54 mg/dL
LDL Cholesterol (Calc): 38 mg/dL
Non-HDL Cholesterol (Calc): 54 mg/dL
Total CHOL/HDL Ratio: 2 (calc)
Triglycerides: 75 mg/dL

## 2024-11-29 LAB — COMPREHENSIVE METABOLIC PANEL WITH GFR
AG Ratio: 1.4 (calc) (ref 1.0–2.5)
ALT: 11 U/L (ref 9–46)
AST: 21 U/L (ref 10–35)
Albumin: 3.8 g/dL (ref 3.6–5.1)
Alkaline phosphatase (APISO): 74 U/L (ref 35–144)
BUN: 18 mg/dL (ref 7–25)
CO2: 30 mmol/L (ref 20–32)
Calcium: 8.6 mg/dL (ref 8.6–10.3)
Chloride: 105 mmol/L (ref 98–110)
Creat: 0.84 mg/dL (ref 0.70–1.22)
Globulin: 2.7 g/dL (ref 1.9–3.7)
Glucose, Bld: 98 mg/dL (ref 65–139)
Potassium: 4.6 mmol/L (ref 3.5–5.3)
Sodium: 141 mmol/L (ref 135–146)
Total Bilirubin: 0.4 mg/dL (ref 0.2–1.2)
Total Protein: 6.5 g/dL (ref 6.1–8.1)
eGFR: 85 mL/min/1.73m2

## 2024-11-29 LAB — TEST AUTHORIZATION

## 2024-11-29 LAB — IRON,TIBC AND FERRITIN PANEL
%SAT: 13 % — ABNORMAL LOW (ref 20–48)
Ferritin: 13 ng/mL — ABNORMAL LOW (ref 24–380)
Iron: 42 ug/dL — ABNORMAL LOW (ref 50–180)
TIBC: 315 ug/dL (ref 250–425)

## 2024-11-29 LAB — HEMOGLOBIN A1C
Hgb A1c MFr Bld: 5.9 % — ABNORMAL HIGH
Mean Plasma Glucose: 123 mg/dL
eAG (mmol/L): 6.8 mmol/L

## 2024-12-06 ENCOUNTER — Other Ambulatory Visit: Payer: Self-pay | Admitting: Family Medicine

## 2024-12-26 ENCOUNTER — Ambulatory Visit: Admitting: Nurse Practitioner

## 2025-02-27 ENCOUNTER — Ambulatory Visit: Admitting: Nurse Practitioner

## 2025-03-09 ENCOUNTER — Ambulatory Visit: Admitting: Physician Assistant

## 2025-09-06 ENCOUNTER — Ambulatory Visit: Admitting: Internal Medicine
